# Patient Record
Sex: Female | Born: 1950
Health system: Southern US, Community
[De-identification: ages and names within clinical notes are randomized; demographics above are authoritative.]

## PROBLEM LIST (undated history)

## (undated) DIAGNOSIS — C50919 Malignant neoplasm of unspecified site of unspecified female breast: Secondary | ICD-10-CM

## (undated) DIAGNOSIS — Z9889 Other specified postprocedural states: Secondary | ICD-10-CM

## (undated) DIAGNOSIS — E559 Vitamin D deficiency, unspecified: Secondary | ICD-10-CM

## (undated) DIAGNOSIS — E039 Hypothyroidism, unspecified: Secondary | ICD-10-CM

## (undated) DIAGNOSIS — F419 Anxiety disorder, unspecified: Secondary | ICD-10-CM

## (undated) DIAGNOSIS — F32A Depression, unspecified: Secondary | ICD-10-CM

## (undated) DIAGNOSIS — I739 Peripheral vascular disease, unspecified: Secondary | ICD-10-CM

## (undated) DIAGNOSIS — J069 Acute upper respiratory infection, unspecified: Secondary | ICD-10-CM

## (undated) DIAGNOSIS — I4949 Other premature depolarization: Secondary | ICD-10-CM

## (undated) DIAGNOSIS — D649 Anemia, unspecified: Secondary | ICD-10-CM

## (undated) DIAGNOSIS — J310 Chronic rhinitis: Secondary | ICD-10-CM

## (undated) DIAGNOSIS — J45909 Unspecified asthma, uncomplicated: Secondary | ICD-10-CM

## (undated) DIAGNOSIS — I10 Essential (primary) hypertension: Secondary | ICD-10-CM

## (undated) DIAGNOSIS — M6282 Rhabdomyolysis: Secondary | ICD-10-CM

## (undated) DIAGNOSIS — I4892 Unspecified atrial flutter: Secondary | ICD-10-CM

## (undated) DIAGNOSIS — J189 Pneumonia, unspecified organism: Secondary | ICD-10-CM

## (undated) DIAGNOSIS — S2239XA Fracture of one rib, unspecified side, initial encounter for closed fracture: Secondary | ICD-10-CM

## (undated) DIAGNOSIS — N6091 Unspecified benign mammary dysplasia of right breast: Secondary | ICD-10-CM

## (undated) DIAGNOSIS — R251 Tremor, unspecified: Secondary | ICD-10-CM

## (undated) DIAGNOSIS — Z86718 Personal history of other venous thrombosis and embolism: Secondary | ICD-10-CM

## (undated) DIAGNOSIS — J449 Chronic obstructive pulmonary disease, unspecified: Secondary | ICD-10-CM

## (undated) DIAGNOSIS — E785 Hyperlipidemia, unspecified: Secondary | ICD-10-CM

## (undated) DIAGNOSIS — R112 Nausea with vomiting, unspecified: Secondary | ICD-10-CM

## (undated) DIAGNOSIS — F329 Major depressive disorder, single episode, unspecified: Secondary | ICD-10-CM

## (undated) DIAGNOSIS — Z9289 Personal history of other medical treatment: Secondary | ICD-10-CM

## (undated) DIAGNOSIS — R7302 Impaired glucose tolerance (oral): Secondary | ICD-10-CM

## (undated) DIAGNOSIS — I4891 Unspecified atrial fibrillation: Secondary | ICD-10-CM

## (undated) DIAGNOSIS — M1990 Unspecified osteoarthritis, unspecified site: Secondary | ICD-10-CM

## (undated) HISTORY — DX: Depression, unspecified: F32.A

## (undated) HISTORY — DX: Major depressive disorder, single episode, unspecified: F32.9

## (undated) HISTORY — PX: TONSILLECTOMY: SUR1361

## (undated) HISTORY — DX: Other premature depolarization: I49.49

## (undated) HISTORY — DX: Chronic obstructive pulmonary disease, unspecified: J44.9

## (undated) HISTORY — PX: OTHER SURGICAL HISTORY: SHX169

## (undated) HISTORY — DX: Personal history of other venous thrombosis and embolism: Z86.718

## (undated) HISTORY — DX: Tremor, unspecified: R25.1

## (undated) HISTORY — DX: Impaired glucose tolerance (oral): R73.02

## (undated) HISTORY — DX: Unspecified benign mammary dysplasia of right breast: N60.91

## (undated) HISTORY — PX: BREAST SURGERY: SHX581

## (undated) HISTORY — PX: ABDOMINAL HYSTERECTOMY: SHX81

## (undated) HISTORY — DX: Morbid (severe) obesity due to excess calories: E66.01

## (undated) HISTORY — DX: Anxiety disorder, unspecified: F41.9

## (undated) HISTORY — DX: Vitamin D deficiency, unspecified: E55.9

## (undated) HISTORY — DX: Essential (primary) hypertension: I10

## (undated) HISTORY — DX: Personal history of other medical treatment: Z92.89

## (undated) HISTORY — DX: Fracture of one rib, unspecified side, initial encounter for closed fracture: S22.39XA

## (undated) HISTORY — PX: COLONOSCOPY: SHX174

## (undated) HISTORY — DX: Chronic rhinitis: J31.0

## (undated) HISTORY — DX: Hyperlipidemia, unspecified: E78.5

## (undated) HISTORY — DX: Other specified postprocedural states: Z98.890

## (undated) HISTORY — DX: Acute upper respiratory infection, unspecified: J06.9

## (undated) HISTORY — DX: Unspecified atrial flutter: I48.92

## (undated) HISTORY — DX: Unspecified atrial fibrillation: I48.91

## (undated) HISTORY — DX: Rhabdomyolysis: M62.82

## (undated) NOTE — *Deleted (*Deleted)
Time of death 1200 per 2 RN per protcol.

---

## 1973-01-11 HISTORY — PX: OTHER SURGICAL HISTORY: SHX169

## 1997-09-30 ENCOUNTER — Ambulatory Visit (HOSPITAL_COMMUNITY): Admission: RE | Admit: 1997-09-30 | Discharge: 1997-09-30 | Payer: Self-pay | Admitting: *Deleted

## 1997-11-04 ENCOUNTER — Ambulatory Visit (HOSPITAL_COMMUNITY): Admission: RE | Admit: 1997-11-04 | Discharge: 1997-11-04 | Payer: Self-pay | Admitting: Gastroenterology

## 1998-01-11 ENCOUNTER — Encounter: Payer: Self-pay | Admitting: Internal Medicine

## 1998-01-11 LAB — CONVERTED CEMR LAB

## 1998-02-03 ENCOUNTER — Emergency Department (HOSPITAL_COMMUNITY): Admission: EM | Admit: 1998-02-03 | Discharge: 1998-02-03 | Payer: Self-pay | Admitting: Emergency Medicine

## 1998-02-03 ENCOUNTER — Encounter: Payer: Self-pay | Admitting: Emergency Medicine

## 1998-04-02 ENCOUNTER — Other Ambulatory Visit: Admission: RE | Admit: 1998-04-02 | Discharge: 1998-04-02 | Payer: Self-pay | Admitting: Obstetrics and Gynecology

## 1998-08-25 ENCOUNTER — Emergency Department (HOSPITAL_COMMUNITY): Admission: EM | Admit: 1998-08-25 | Discharge: 1998-08-25 | Payer: Self-pay | Admitting: Emergency Medicine

## 1998-08-25 ENCOUNTER — Encounter: Payer: Self-pay | Admitting: Emergency Medicine

## 1998-09-17 ENCOUNTER — Encounter: Admission: RE | Admit: 1998-09-17 | Discharge: 1998-12-16 | Payer: Self-pay | Admitting: Neurology

## 1998-09-18 ENCOUNTER — Other Ambulatory Visit: Admission: RE | Admit: 1998-09-18 | Discharge: 1998-09-18 | Payer: Self-pay | Admitting: Obstetrics and Gynecology

## 1998-10-22 ENCOUNTER — Encounter: Admission: RE | Admit: 1998-10-22 | Discharge: 1998-10-22 | Payer: Self-pay | Admitting: Neurology

## 1999-03-02 ENCOUNTER — Encounter: Payer: Self-pay | Admitting: Emergency Medicine

## 1999-03-02 ENCOUNTER — Inpatient Hospital Stay (HOSPITAL_COMMUNITY): Admission: EM | Admit: 1999-03-02 | Discharge: 1999-03-03 | Payer: Self-pay | Admitting: Emergency Medicine

## 1999-05-26 ENCOUNTER — Ambulatory Visit (HOSPITAL_COMMUNITY): Admission: RE | Admit: 1999-05-26 | Discharge: 1999-05-26 | Payer: Self-pay | Admitting: *Deleted

## 1999-06-24 ENCOUNTER — Inpatient Hospital Stay (HOSPITAL_COMMUNITY): Admission: EM | Admit: 1999-06-24 | Discharge: 1999-06-28 | Payer: Self-pay | Admitting: Emergency Medicine

## 1999-06-24 ENCOUNTER — Encounter: Payer: Self-pay | Admitting: Emergency Medicine

## 1999-07-02 ENCOUNTER — Other Ambulatory Visit: Admission: RE | Admit: 1999-07-02 | Discharge: 1999-07-02 | Payer: Self-pay | Admitting: Obstetrics & Gynecology

## 1999-07-03 ENCOUNTER — Encounter: Payer: Self-pay | Admitting: Obstetrics & Gynecology

## 1999-07-03 ENCOUNTER — Encounter: Admission: RE | Admit: 1999-07-03 | Discharge: 1999-07-03 | Payer: Self-pay | Admitting: Obstetrics & Gynecology

## 1999-09-01 ENCOUNTER — Encounter: Admission: RE | Admit: 1999-09-01 | Discharge: 1999-09-01 | Payer: Self-pay

## 1999-09-05 ENCOUNTER — Emergency Department (HOSPITAL_COMMUNITY): Admission: EM | Admit: 1999-09-05 | Discharge: 1999-09-05 | Payer: Self-pay | Admitting: Emergency Medicine

## 1999-09-05 ENCOUNTER — Encounter: Payer: Self-pay | Admitting: Emergency Medicine

## 2000-09-28 ENCOUNTER — Encounter: Admission: RE | Admit: 2000-09-28 | Discharge: 2000-09-28 | Payer: Self-pay | Admitting: Internal Medicine

## 2000-09-28 ENCOUNTER — Encounter: Payer: Self-pay | Admitting: Internal Medicine

## 2000-10-11 ENCOUNTER — Encounter: Payer: Self-pay | Admitting: Emergency Medicine

## 2000-10-12 ENCOUNTER — Inpatient Hospital Stay (HOSPITAL_COMMUNITY): Admission: EM | Admit: 2000-10-12 | Discharge: 2000-10-17 | Payer: Self-pay | Admitting: Psychiatry

## 2000-10-18 ENCOUNTER — Other Ambulatory Visit (HOSPITAL_COMMUNITY): Admission: RE | Admit: 2000-10-18 | Discharge: 2000-10-27 | Payer: Self-pay | Admitting: *Deleted

## 2000-11-16 ENCOUNTER — Encounter: Admission: RE | Admit: 2000-11-16 | Discharge: 2000-11-16 | Payer: Self-pay | Admitting: *Deleted

## 2000-11-22 ENCOUNTER — Encounter: Payer: Self-pay | Admitting: Internal Medicine

## 2000-11-22 ENCOUNTER — Inpatient Hospital Stay (HOSPITAL_COMMUNITY): Admission: EM | Admit: 2000-11-22 | Discharge: 2000-11-26 | Payer: Self-pay | Admitting: Emergency Medicine

## 2000-11-24 ENCOUNTER — Encounter: Payer: Self-pay | Admitting: Internal Medicine

## 2000-12-14 ENCOUNTER — Encounter: Admission: RE | Admit: 2000-12-14 | Discharge: 2000-12-14 | Payer: Self-pay | Admitting: *Deleted

## 2001-01-25 ENCOUNTER — Encounter: Payer: Self-pay | Admitting: Cardiology

## 2001-01-25 ENCOUNTER — Ambulatory Visit (HOSPITAL_COMMUNITY): Admission: RE | Admit: 2001-01-25 | Discharge: 2001-01-25 | Payer: Self-pay | Admitting: Cardiology

## 2001-08-25 ENCOUNTER — Inpatient Hospital Stay (HOSPITAL_COMMUNITY): Admission: EM | Admit: 2001-08-25 | Discharge: 2001-08-26 | Payer: Self-pay | Admitting: *Deleted

## 2001-12-08 ENCOUNTER — Encounter: Payer: Self-pay | Admitting: Obstetrics and Gynecology

## 2001-12-08 ENCOUNTER — Encounter: Admission: RE | Admit: 2001-12-08 | Discharge: 2001-12-08 | Payer: Self-pay | Admitting: Obstetrics and Gynecology

## 2002-09-07 ENCOUNTER — Emergency Department (HOSPITAL_COMMUNITY): Admission: EM | Admit: 2002-09-07 | Discharge: 2002-09-07 | Payer: Self-pay | Admitting: Emergency Medicine

## 2002-09-07 ENCOUNTER — Encounter: Payer: Self-pay | Admitting: Emergency Medicine

## 2003-01-17 ENCOUNTER — Encounter: Admission: RE | Admit: 2003-01-17 | Discharge: 2003-01-17 | Payer: Self-pay | Admitting: Internal Medicine

## 2003-02-22 ENCOUNTER — Emergency Department (HOSPITAL_COMMUNITY): Admission: EM | Admit: 2003-02-22 | Discharge: 2003-02-22 | Payer: Self-pay | Admitting: Emergency Medicine

## 2003-03-22 ENCOUNTER — Encounter: Admission: RE | Admit: 2003-03-22 | Discharge: 2003-03-22 | Payer: Self-pay | Admitting: Internal Medicine

## 2003-06-17 ENCOUNTER — Encounter: Admission: RE | Admit: 2003-06-17 | Discharge: 2003-06-17 | Payer: Self-pay | Admitting: Internal Medicine

## 2003-06-21 ENCOUNTER — Inpatient Hospital Stay (HOSPITAL_COMMUNITY): Admission: EM | Admit: 2003-06-21 | Discharge: 2003-06-22 | Payer: Self-pay | Admitting: Emergency Medicine

## 2003-08-12 ENCOUNTER — Emergency Department (HOSPITAL_COMMUNITY): Admission: EM | Admit: 2003-08-12 | Discharge: 2003-08-13 | Payer: Self-pay | Admitting: Emergency Medicine

## 2003-08-13 ENCOUNTER — Ambulatory Visit (HOSPITAL_COMMUNITY): Admission: RE | Admit: 2003-08-13 | Discharge: 2003-08-13 | Payer: Self-pay | Admitting: Emergency Medicine

## 2003-10-22 ENCOUNTER — Encounter: Payer: Self-pay | Admitting: Internal Medicine

## 2004-02-03 ENCOUNTER — Encounter: Admission: RE | Admit: 2004-02-03 | Discharge: 2004-02-03 | Payer: Self-pay | Admitting: Obstetrics and Gynecology

## 2004-02-13 LAB — HM COLONOSCOPY

## 2004-02-21 ENCOUNTER — Ambulatory Visit: Payer: Self-pay | Admitting: Internal Medicine

## 2004-02-26 ENCOUNTER — Ambulatory Visit: Payer: Self-pay | Admitting: Internal Medicine

## 2004-02-27 ENCOUNTER — Ambulatory Visit: Payer: Self-pay | Admitting: Internal Medicine

## 2004-03-10 ENCOUNTER — Ambulatory Visit: Payer: Self-pay | Admitting: Internal Medicine

## 2004-03-18 ENCOUNTER — Ambulatory Visit: Payer: Self-pay | Admitting: Internal Medicine

## 2004-03-30 ENCOUNTER — Ambulatory Visit: Payer: Self-pay | Admitting: Internal Medicine

## 2004-03-31 ENCOUNTER — Ambulatory Visit: Payer: Self-pay | Admitting: Internal Medicine

## 2004-04-17 ENCOUNTER — Emergency Department (HOSPITAL_COMMUNITY): Admission: EM | Admit: 2004-04-17 | Discharge: 2004-04-17 | Payer: Self-pay | Admitting: Emergency Medicine

## 2004-04-30 ENCOUNTER — Ambulatory Visit: Payer: Self-pay | Admitting: Internal Medicine

## 2004-06-19 ENCOUNTER — Ambulatory Visit: Payer: Self-pay | Admitting: Internal Medicine

## 2004-07-10 ENCOUNTER — Ambulatory Visit: Payer: Self-pay | Admitting: Internal Medicine

## 2004-09-25 ENCOUNTER — Ambulatory Visit: Payer: Self-pay | Admitting: Internal Medicine

## 2004-09-29 ENCOUNTER — Ambulatory Visit: Payer: Self-pay | Admitting: Internal Medicine

## 2004-11-04 ENCOUNTER — Ambulatory Visit: Payer: Self-pay | Admitting: Internal Medicine

## 2004-11-27 ENCOUNTER — Ambulatory Visit: Payer: Self-pay | Admitting: Cardiology

## 2004-11-27 ENCOUNTER — Inpatient Hospital Stay (HOSPITAL_COMMUNITY): Admission: EM | Admit: 2004-11-27 | Discharge: 2004-12-05 | Payer: Self-pay | Admitting: Emergency Medicine

## 2004-12-07 ENCOUNTER — Ambulatory Visit: Payer: Self-pay | Admitting: Cardiology

## 2004-12-08 ENCOUNTER — Ambulatory Visit: Payer: Self-pay | Admitting: Internal Medicine

## 2004-12-17 ENCOUNTER — Ambulatory Visit: Payer: Self-pay | Admitting: Internal Medicine

## 2004-12-17 ENCOUNTER — Ambulatory Visit: Payer: Self-pay | Admitting: Cardiology

## 2004-12-24 ENCOUNTER — Ambulatory Visit: Payer: Self-pay | Admitting: *Deleted

## 2004-12-24 ENCOUNTER — Ambulatory Visit: Payer: Self-pay | Admitting: Internal Medicine

## 2004-12-31 ENCOUNTER — Ambulatory Visit: Payer: Self-pay | Admitting: Cardiology

## 2005-01-08 ENCOUNTER — Ambulatory Visit: Payer: Self-pay | Admitting: Endocrinology

## 2005-01-18 ENCOUNTER — Ambulatory Visit: Payer: Self-pay | Admitting: Cardiology

## 2005-01-21 ENCOUNTER — Ambulatory Visit: Payer: Self-pay | Admitting: Cardiology

## 2005-01-21 ENCOUNTER — Ambulatory Visit: Payer: Self-pay | Admitting: Hematology & Oncology

## 2005-02-18 ENCOUNTER — Ambulatory Visit: Payer: Self-pay | Admitting: *Deleted

## 2005-02-25 ENCOUNTER — Ambulatory Visit: Payer: Self-pay | Admitting: Internal Medicine

## 2005-03-04 ENCOUNTER — Ambulatory Visit: Payer: Self-pay | Admitting: Cardiology

## 2005-03-11 ENCOUNTER — Emergency Department (HOSPITAL_COMMUNITY): Admission: EM | Admit: 2005-03-11 | Discharge: 2005-03-11 | Payer: Self-pay | Admitting: Emergency Medicine

## 2005-03-11 ENCOUNTER — Ambulatory Visit: Payer: Self-pay | Admitting: Internal Medicine

## 2005-03-18 ENCOUNTER — Ambulatory Visit: Payer: Self-pay | Admitting: Cardiology

## 2005-04-08 ENCOUNTER — Ambulatory Visit: Payer: Self-pay | Admitting: Cardiology

## 2005-04-13 ENCOUNTER — Ambulatory Visit: Payer: Self-pay | Admitting: Internal Medicine

## 2005-04-21 ENCOUNTER — Ambulatory Visit: Payer: Self-pay | Admitting: Internal Medicine

## 2005-04-22 ENCOUNTER — Ambulatory Visit: Payer: Self-pay | Admitting: Cardiology

## 2005-05-13 ENCOUNTER — Ambulatory Visit: Payer: Self-pay | Admitting: Cardiology

## 2005-05-25 ENCOUNTER — Encounter: Admission: RE | Admit: 2005-05-25 | Discharge: 2005-05-25 | Payer: Self-pay | Admitting: Occupational Medicine

## 2005-05-27 ENCOUNTER — Ambulatory Visit: Payer: Self-pay | Admitting: Cardiology

## 2005-05-31 ENCOUNTER — Ambulatory Visit: Payer: Self-pay | Admitting: Internal Medicine

## 2005-06-17 ENCOUNTER — Ambulatory Visit: Payer: Self-pay | Admitting: Internal Medicine

## 2005-07-05 ENCOUNTER — Ambulatory Visit: Payer: Self-pay | Admitting: Cardiology

## 2005-07-19 ENCOUNTER — Ambulatory Visit: Payer: Self-pay | Admitting: Internal Medicine

## 2005-08-16 ENCOUNTER — Ambulatory Visit: Payer: Self-pay | Admitting: Internal Medicine

## 2005-08-31 ENCOUNTER — Ambulatory Visit: Payer: Self-pay | Admitting: Internal Medicine

## 2005-09-14 ENCOUNTER — Inpatient Hospital Stay (HOSPITAL_COMMUNITY): Admission: AD | Admit: 2005-09-14 | Discharge: 2005-09-24 | Payer: Self-pay | Admitting: Cardiology

## 2005-09-14 ENCOUNTER — Ambulatory Visit: Payer: Self-pay | Admitting: Cardiovascular Disease

## 2005-09-14 ENCOUNTER — Ambulatory Visit: Payer: Self-pay | Admitting: Critical Care Medicine

## 2005-09-15 ENCOUNTER — Encounter: Payer: Self-pay | Admitting: Cardiology

## 2005-09-15 ENCOUNTER — Ambulatory Visit: Payer: Self-pay | Admitting: Cardiology

## 2005-09-27 ENCOUNTER — Ambulatory Visit: Payer: Self-pay | Admitting: Cardiology

## 2005-09-28 ENCOUNTER — Ambulatory Visit: Payer: Self-pay | Admitting: Internal Medicine

## 2005-10-06 ENCOUNTER — Ambulatory Visit: Payer: Self-pay | Admitting: Cardiology

## 2005-10-12 ENCOUNTER — Ambulatory Visit: Payer: Self-pay | Admitting: Cardiology

## 2005-10-13 ENCOUNTER — Inpatient Hospital Stay (HOSPITAL_COMMUNITY): Admission: EM | Admit: 2005-10-13 | Discharge: 2005-10-16 | Payer: Self-pay | Admitting: Emergency Medicine

## 2005-10-13 ENCOUNTER — Ambulatory Visit: Payer: Self-pay | Admitting: Internal Medicine

## 2005-10-18 ENCOUNTER — Ambulatory Visit: Payer: Self-pay | Admitting: Pulmonary Disease

## 2005-10-19 ENCOUNTER — Ambulatory Visit: Payer: Self-pay | Admitting: Internal Medicine

## 2005-10-22 ENCOUNTER — Ambulatory Visit: Payer: Self-pay | Admitting: Cardiology

## 2005-10-28 ENCOUNTER — Ambulatory Visit: Payer: Self-pay | Admitting: Internal Medicine

## 2005-11-01 ENCOUNTER — Ambulatory Visit: Payer: Self-pay | Admitting: Cardiovascular Disease

## 2005-11-03 ENCOUNTER — Ambulatory Visit: Payer: Self-pay | Admitting: Internal Medicine

## 2005-11-04 ENCOUNTER — Ambulatory Visit: Payer: Self-pay

## 2005-11-05 ENCOUNTER — Ambulatory Visit: Payer: Self-pay | Admitting: Internal Medicine

## 2005-11-12 ENCOUNTER — Ambulatory Visit: Payer: Self-pay | Admitting: Internal Medicine

## 2005-11-15 ENCOUNTER — Emergency Department (HOSPITAL_COMMUNITY): Admission: EM | Admit: 2005-11-15 | Discharge: 2005-11-15 | Payer: Self-pay | Admitting: Emergency Medicine

## 2005-11-22 ENCOUNTER — Ambulatory Visit: Payer: Self-pay | Admitting: Cardiovascular Disease

## 2005-12-01 ENCOUNTER — Ambulatory Visit: Payer: Self-pay | Admitting: Internal Medicine

## 2005-12-20 ENCOUNTER — Ambulatory Visit: Payer: Self-pay | Admitting: Cardiology

## 2005-12-22 ENCOUNTER — Ambulatory Visit: Payer: Self-pay | Admitting: Internal Medicine

## 2006-01-12 ENCOUNTER — Ambulatory Visit: Payer: Self-pay | Admitting: Cardiology

## 2006-01-21 ENCOUNTER — Ambulatory Visit: Payer: Self-pay | Admitting: Internal Medicine

## 2006-01-25 ENCOUNTER — Ambulatory Visit: Payer: Self-pay | Admitting: Cardiology

## 2006-02-04 ENCOUNTER — Ambulatory Visit: Payer: Self-pay | Admitting: Pulmonary Disease

## 2006-02-11 ENCOUNTER — Ambulatory Visit: Payer: Self-pay | Admitting: Internal Medicine

## 2006-02-11 ENCOUNTER — Encounter: Admission: RE | Admit: 2006-02-11 | Discharge: 2006-05-12 | Payer: Self-pay | Admitting: Internal Medicine

## 2006-03-01 ENCOUNTER — Ambulatory Visit: Payer: Self-pay | Admitting: Internal Medicine

## 2006-03-02 ENCOUNTER — Ambulatory Visit: Payer: Self-pay | Admitting: Cardiovascular Disease

## 2006-03-11 ENCOUNTER — Ambulatory Visit: Payer: Self-pay | Admitting: Internal Medicine

## 2006-03-16 ENCOUNTER — Ambulatory Visit: Payer: Self-pay | Admitting: *Deleted

## 2006-03-17 ENCOUNTER — Ambulatory Visit: Payer: Self-pay | Admitting: Internal Medicine

## 2006-03-17 LAB — CONVERTED CEMR LAB
ALT: 21 units/L (ref 0–40)
AST: 19 units/L (ref 0–37)
Albumin: 3.6 g/dL (ref 3.5–5.2)
Alkaline Phosphatase: 69 units/L (ref 39–117)
BUN: 12 mg/dL (ref 6–23)
Basophils Absolute: 0.1 10*3/uL (ref 0.0–0.1)
Basophils Relative: 0.9 % (ref 0.0–1.0)
Bilirubin Urine: NEGATIVE
Bilirubin, Direct: 0.2 mg/dL (ref 0.0–0.3)
CO2: 33 meq/L — ABNORMAL HIGH (ref 19–32)
Calcium: 8.8 mg/dL (ref 8.4–10.5)
Chloride: 100 meq/L (ref 96–112)
Cholesterol: 194 mg/dL (ref 0–200)
Creatinine, Ser: 0.9 mg/dL (ref 0.4–1.2)
Crystals: NEGATIVE
Direct LDL: 130.7 mg/dL
Eosinophils Absolute: 0.2 10*3/uL (ref 0.0–0.6)
Eosinophils Relative: 2.5 % (ref 0.0–5.0)
GFR calc Af Amer: 84 mL/min
GFR calc non Af Amer: 69 mL/min
Glucose, Bld: 95 mg/dL (ref 70–99)
HCT: 33.8 % — ABNORMAL LOW (ref 36.0–46.0)
HDL: 33.9 mg/dL — ABNORMAL LOW (ref >39.0)
Hemoglobin, Urine: NEGATIVE
Hemoglobin: 11.8 g/dL — ABNORMAL LOW (ref 12.0–15.0)
Ketones, ur: NEGATIVE mg/dL
Lymphocytes Relative: 34.2 % (ref 12.0–46.0)
MCHC: 34.9 g/dL (ref 30.0–36.0)
MCV: 86 fL (ref 78.0–100.0)
Monocytes Absolute: 0.6 10*3/uL (ref 0.2–0.7)
Monocytes Relative: 9 % (ref 3.0–11.0)
Mucus, UA: NEGATIVE
Neutro Abs: 3.4 10*3/uL (ref 1.4–7.7)
Neutrophils Relative %: 53.4 % (ref 43.0–77.0)
Nitrite: NEGATIVE
Platelets: 236 10*3/uL (ref 150–400)
Potassium: 3.5 meq/L (ref 3.5–5.1)
RBC: 3.93 M/uL (ref 3.87–5.11)
RDW: 16.1 % — ABNORMAL HIGH (ref 11.5–14.6)
Sodium: 140 meq/L (ref 135–145)
Specific Gravity, Urine: 1.015 (ref 1.000–1.03)
TSH: 5.49 microintl units/mL (ref 0.35–5.50)
Total Bilirubin: 0.9 mg/dL (ref 0.3–1.2)
Total CHOL/HDL Ratio: 5.7
Total Protein, Urine: NEGATIVE mg/dL
Total Protein: 6.4 g/dL (ref 6.0–8.3)
Triglycerides: 222 mg/dL (ref 0–149)
Urine Glucose: NEGATIVE mg/dL
Urobilinogen, UA: 0.2 (ref 0.0–1.0)
VLDL: 44 mg/dL — ABNORMAL HIGH (ref 0–40)
WBC: 6.6 10*3/uL (ref 4.5–10.5)
pH: 7.5 (ref 5.0–8.0)

## 2006-03-22 ENCOUNTER — Ambulatory Visit: Payer: Self-pay | Admitting: Internal Medicine

## 2006-03-23 ENCOUNTER — Ambulatory Visit: Payer: Self-pay | Admitting: Pulmonary Disease

## 2006-03-28 ENCOUNTER — Ambulatory Visit: Payer: Self-pay | Admitting: Cardiology

## 2006-03-30 ENCOUNTER — Ambulatory Visit: Payer: Self-pay | Admitting: Cardiovascular Disease

## 2006-04-27 ENCOUNTER — Ambulatory Visit: Payer: Self-pay | Admitting: *Deleted

## 2006-04-29 ENCOUNTER — Ambulatory Visit: Payer: Self-pay | Admitting: Internal Medicine

## 2006-05-06 ENCOUNTER — Ambulatory Visit: Payer: Self-pay | Admitting: Pulmonary Disease

## 2006-05-17 ENCOUNTER — Ambulatory Visit: Payer: Self-pay | Admitting: Internal Medicine

## 2006-05-25 ENCOUNTER — Ambulatory Visit: Payer: Self-pay | Admitting: *Deleted

## 2006-06-13 ENCOUNTER — Ambulatory Visit: Payer: Self-pay | Admitting: Internal Medicine

## 2006-06-17 ENCOUNTER — Ambulatory Visit: Payer: Self-pay | Admitting: Internal Medicine

## 2006-06-22 ENCOUNTER — Ambulatory Visit: Payer: Self-pay | Admitting: Cardiology

## 2006-07-20 ENCOUNTER — Ambulatory Visit: Payer: Self-pay | Admitting: Cardiology

## 2006-07-25 ENCOUNTER — Ambulatory Visit: Payer: Self-pay | Admitting: Internal Medicine

## 2006-07-29 ENCOUNTER — Encounter: Payer: Self-pay | Admitting: Internal Medicine

## 2006-07-29 DIAGNOSIS — Z8672 Personal history of thrombophlebitis: Secondary | ICD-10-CM | POA: Insufficient documentation

## 2006-07-29 DIAGNOSIS — M810 Age-related osteoporosis without current pathological fracture: Secondary | ICD-10-CM | POA: Insufficient documentation

## 2006-07-29 DIAGNOSIS — I10 Essential (primary) hypertension: Secondary | ICD-10-CM | POA: Insufficient documentation

## 2006-08-05 ENCOUNTER — Ambulatory Visit: Payer: Self-pay | Admitting: Internal Medicine

## 2006-08-06 DIAGNOSIS — J449 Chronic obstructive pulmonary disease, unspecified: Secondary | ICD-10-CM | POA: Insufficient documentation

## 2006-08-06 DIAGNOSIS — F32A Depression, unspecified: Secondary | ICD-10-CM | POA: Insufficient documentation

## 2006-08-06 DIAGNOSIS — F329 Major depressive disorder, single episode, unspecified: Secondary | ICD-10-CM | POA: Insufficient documentation

## 2006-08-17 ENCOUNTER — Ambulatory Visit: Payer: Self-pay | Admitting: Cardiology

## 2006-08-29 ENCOUNTER — Ambulatory Visit: Payer: Self-pay | Admitting: Internal Medicine

## 2006-08-29 LAB — CONVERTED CEMR LAB
ALT: 17 units/L (ref 0–35)
AST: 23 units/L (ref 0–37)
Albumin: 3.6 g/dL (ref 3.5–5.2)
Alkaline Phosphatase: 65 units/L (ref 39–117)
BUN: 12 mg/dL (ref 6–23)
Basophils Absolute: 0.1 10*3/uL (ref 0.0–0.1)
Basophils Relative: 1.2 % — ABNORMAL HIGH (ref 0.0–1.0)
Bilirubin, Direct: 0.2 mg/dL (ref 0.0–0.3)
CO2: 35 meq/L — ABNORMAL HIGH (ref 19–32)
Calcium: 8.5 mg/dL (ref 8.4–10.5)
Chloride: 106 meq/L (ref 96–112)
Creatinine, Ser: 1 mg/dL (ref 0.4–1.2)
Eosinophils Absolute: 0.2 10*3/uL (ref 0.0–0.6)
Eosinophils Relative: 2.8 % (ref 0.0–5.0)
GFR calc Af Amer: 74 mL/min
GFR calc non Af Amer: 61 mL/min
Glucose, Bld: 101 mg/dL — ABNORMAL HIGH (ref 70–99)
HCT: 34.1 % — ABNORMAL LOW (ref 36.0–46.0)
Hemoglobin: 12 g/dL (ref 12.0–15.0)
Lymphocytes Relative: 28.3 % (ref 12.0–46.0)
MCHC: 35.3 g/dL (ref 30.0–36.0)
MCV: 86.7 fL (ref 78.0–100.0)
Monocytes Absolute: 0.6 10*3/uL (ref 0.2–0.7)
Monocytes Relative: 8.7 % (ref 3.0–11.0)
Neutro Abs: 4.3 10*3/uL (ref 1.4–7.7)
Neutrophils Relative %: 59 % (ref 43.0–77.0)
Platelets: 244 10*3/uL (ref 150–400)
Potassium: 4.1 meq/L (ref 3.5–5.1)
RBC: 3.93 M/uL (ref 3.87–5.11)
RDW: 16.6 % — ABNORMAL HIGH (ref 11.5–14.6)
Sodium: 143 meq/L (ref 135–145)
TSH: 3.23 microintl units/mL (ref 0.35–5.50)
Total Bilirubin: 1.2 mg/dL (ref 0.3–1.2)
Total Protein: 6.2 g/dL (ref 6.0–8.3)
WBC: 7.3 10*3/uL (ref 4.5–10.5)

## 2006-08-31 ENCOUNTER — Ambulatory Visit: Payer: Self-pay | Admitting: Cardiology

## 2006-09-16 ENCOUNTER — Ambulatory Visit: Payer: Self-pay | Admitting: Internal Medicine

## 2006-09-21 ENCOUNTER — Ambulatory Visit: Payer: Self-pay | Admitting: Cardiology

## 2006-09-30 ENCOUNTER — Ambulatory Visit: Payer: Self-pay | Admitting: Pulmonary Disease

## 2006-10-18 ENCOUNTER — Ambulatory Visit: Payer: Self-pay | Admitting: Internal Medicine

## 2006-10-21 ENCOUNTER — Ambulatory Visit: Payer: Self-pay | Admitting: Cardiology

## 2006-10-25 ENCOUNTER — Ambulatory Visit: Payer: Self-pay | Admitting: Cardiology

## 2006-10-25 ENCOUNTER — Ambulatory Visit: Payer: Self-pay | Admitting: Internal Medicine

## 2006-10-27 ENCOUNTER — Ambulatory Visit: Payer: Self-pay | Admitting: Cardiology

## 2006-11-22 ENCOUNTER — Ambulatory Visit: Payer: Self-pay | Admitting: Cardiovascular Disease

## 2006-12-07 ENCOUNTER — Ambulatory Visit: Payer: Self-pay | Admitting: Critical Care Medicine

## 2006-12-07 ENCOUNTER — Inpatient Hospital Stay (HOSPITAL_COMMUNITY): Admission: EM | Admit: 2006-12-07 | Discharge: 2006-12-09 | Payer: Self-pay | Admitting: Emergency Medicine

## 2006-12-13 ENCOUNTER — Ambulatory Visit: Payer: Self-pay | Admitting: Cardiology

## 2006-12-16 ENCOUNTER — Ambulatory Visit: Payer: Self-pay | Admitting: Internal Medicine

## 2007-01-13 ENCOUNTER — Ambulatory Visit: Payer: Self-pay | Admitting: Internal Medicine

## 2007-01-13 DIAGNOSIS — J9612 Chronic respiratory failure with hypercapnia: Secondary | ICD-10-CM

## 2007-01-13 DIAGNOSIS — J9611 Chronic respiratory failure with hypoxia: Secondary | ICD-10-CM | POA: Insufficient documentation

## 2007-01-17 ENCOUNTER — Ambulatory Visit: Payer: Self-pay | Admitting: Cardiology

## 2007-01-17 LAB — CONVERTED CEMR LAB
INR: 3.9 — ABNORMAL HIGH (ref 0.8–1.0)
Prothrombin Time: 25.1 s — ABNORMAL HIGH (ref 10.9–13.3)

## 2007-01-19 ENCOUNTER — Ambulatory Visit: Payer: Self-pay | Admitting: Internal Medicine

## 2007-01-19 DIAGNOSIS — J441 Chronic obstructive pulmonary disease with (acute) exacerbation: Secondary | ICD-10-CM | POA: Insufficient documentation

## 2007-02-02 ENCOUNTER — Ambulatory Visit: Payer: Self-pay | Admitting: Cardiology

## 2007-02-10 ENCOUNTER — Ambulatory Visit: Payer: Self-pay | Admitting: Cardiology

## 2007-02-23 ENCOUNTER — Ambulatory Visit: Payer: Self-pay | Admitting: Internal Medicine

## 2007-03-01 ENCOUNTER — Ambulatory Visit: Payer: Self-pay | Admitting: Internal Medicine

## 2007-03-03 ENCOUNTER — Ambulatory Visit: Payer: Self-pay | Admitting: Cardiology

## 2007-03-13 LAB — CONVERTED CEMR LAB: Pap Smear: NORMAL

## 2007-03-17 ENCOUNTER — Ambulatory Visit: Payer: Self-pay | Admitting: Internal Medicine

## 2007-03-20 DIAGNOSIS — R609 Edema, unspecified: Secondary | ICD-10-CM | POA: Insufficient documentation

## 2007-03-22 ENCOUNTER — Telehealth (INDEPENDENT_AMBULATORY_CARE_PROVIDER_SITE_OTHER): Payer: Self-pay | Admitting: *Deleted

## 2007-03-23 ENCOUNTER — Ambulatory Visit: Payer: Self-pay | Admitting: Internal Medicine

## 2007-04-07 ENCOUNTER — Ambulatory Visit: Payer: Self-pay | Admitting: Cardiology

## 2007-04-13 ENCOUNTER — Telehealth (INDEPENDENT_AMBULATORY_CARE_PROVIDER_SITE_OTHER): Payer: Self-pay | Admitting: *Deleted

## 2007-04-19 ENCOUNTER — Telehealth: Payer: Self-pay | Admitting: Internal Medicine

## 2007-04-26 ENCOUNTER — Ambulatory Visit: Payer: Self-pay | Admitting: Internal Medicine

## 2007-04-26 DIAGNOSIS — J310 Chronic rhinitis: Secondary | ICD-10-CM | POA: Insufficient documentation

## 2007-05-05 ENCOUNTER — Ambulatory Visit: Payer: Self-pay | Admitting: Cardiology

## 2007-05-26 ENCOUNTER — Ambulatory Visit: Payer: Self-pay | Admitting: Internal Medicine

## 2007-06-08 ENCOUNTER — Ambulatory Visit: Payer: Self-pay | Admitting: Cardiology

## 2007-06-19 ENCOUNTER — Ambulatory Visit: Payer: Self-pay | Admitting: Internal Medicine

## 2007-06-19 DIAGNOSIS — E739 Lactose intolerance, unspecified: Secondary | ICD-10-CM | POA: Insufficient documentation

## 2007-06-19 DIAGNOSIS — Z86718 Personal history of other venous thrombosis and embolism: Secondary | ICD-10-CM | POA: Insufficient documentation

## 2007-06-19 DIAGNOSIS — I480 Paroxysmal atrial fibrillation: Secondary | ICD-10-CM | POA: Insufficient documentation

## 2007-06-19 DIAGNOSIS — E785 Hyperlipidemia, unspecified: Secondary | ICD-10-CM | POA: Insufficient documentation

## 2007-06-19 DIAGNOSIS — F411 Generalized anxiety disorder: Secondary | ICD-10-CM | POA: Insufficient documentation

## 2007-06-19 HISTORY — DX: Personal history of other venous thrombosis and embolism: Z86.718

## 2007-06-19 LAB — CONVERTED CEMR LAB
ALT: 19 units/L (ref 0–35)
AST: 17 units/L (ref 0–37)
Albumin: 3.8 g/dL (ref 3.5–5.2)
Alkaline Phosphatase: 79 units/L (ref 39–117)
BUN: 14 mg/dL (ref 6–23)
Basophils Absolute: 0.1 10*3/uL (ref 0.0–0.1)
Basophils Relative: 0.9 % (ref 0.0–1.0)
Bilirubin Urine: NEGATIVE
Bilirubin, Direct: 0.2 mg/dL (ref 0.0–0.3)
CO2: 35 meq/L — ABNORMAL HIGH (ref 19–32)
Calcium: 8.9 mg/dL (ref 8.4–10.5)
Chloride: 102 meq/L (ref 96–112)
Creatinine, Ser: 0.9 mg/dL (ref 0.4–1.2)
Crystals: NEGATIVE
Eosinophils Absolute: 0.2 10*3/uL (ref 0.0–0.7)
Eosinophils Relative: 1.9 % (ref 0.0–5.0)
GFR calc Af Amer: 83 mL/min
GFR calc non Af Amer: 69 mL/min
Glucose, Bld: 96 mg/dL (ref 70–99)
HCT: 36 % (ref 36.0–46.0)
Hemoglobin, Urine: NEGATIVE
Hemoglobin: 12.4 g/dL (ref 12.0–15.0)
Hgb A1c MFr Bld: 4.8 % (ref 4.6–6.0)
Ketones, ur: NEGATIVE mg/dL
Lymphocytes Relative: 30.6 % (ref 12.0–46.0)
MCHC: 34.5 g/dL (ref 30.0–36.0)
MCV: 85.2 fL (ref 78.0–100.0)
Monocytes Absolute: 0.7 10*3/uL (ref 0.1–1.0)
Monocytes Relative: 9.1 % (ref 3.0–12.0)
Mucus, UA: NEGATIVE
Neutro Abs: 4.6 10*3/uL (ref 1.4–7.7)
Neutrophils Relative %: 57.5 % (ref 43.0–77.0)
Nitrite: NEGATIVE
Platelets: 246 10*3/uL (ref 150–400)
Potassium: 4 meq/L (ref 3.5–5.1)
RBC / HPF: NONE SEEN
RBC: 4.22 M/uL (ref 3.87–5.11)
RDW: 16.4 % — ABNORMAL HIGH (ref 11.5–14.6)
Sodium: 145 meq/L (ref 135–145)
Specific Gravity, Urine: 1.015 (ref 1.000–1.03)
TSH: 3.17 microintl units/mL (ref 0.35–5.50)
Total Bilirubin: 1.2 mg/dL (ref 0.3–1.2)
Total Protein, Urine: NEGATIVE mg/dL
Total Protein: 6.5 g/dL (ref 6.0–8.3)
Urine Glucose: NEGATIVE mg/dL
Urobilinogen, UA: 0.2 (ref 0.0–1.0)
WBC: 8 10*3/uL (ref 4.5–10.5)
pH: 7 (ref 5.0–8.0)

## 2007-06-27 ENCOUNTER — Ambulatory Visit: Payer: Self-pay | Admitting: Cardiology

## 2007-07-04 ENCOUNTER — Telehealth: Payer: Self-pay | Admitting: Internal Medicine

## 2007-07-07 ENCOUNTER — Ambulatory Visit: Payer: Self-pay | Admitting: Internal Medicine

## 2007-07-10 ENCOUNTER — Telehealth: Payer: Self-pay | Admitting: Internal Medicine

## 2007-07-19 ENCOUNTER — Telehealth (INDEPENDENT_AMBULATORY_CARE_PROVIDER_SITE_OTHER): Payer: Self-pay | Admitting: *Deleted

## 2007-07-25 ENCOUNTER — Ambulatory Visit: Payer: Self-pay | Admitting: Cardiology

## 2007-07-27 ENCOUNTER — Telehealth (INDEPENDENT_AMBULATORY_CARE_PROVIDER_SITE_OTHER): Payer: Self-pay | Admitting: *Deleted

## 2007-07-28 ENCOUNTER — Telehealth (INDEPENDENT_AMBULATORY_CARE_PROVIDER_SITE_OTHER): Payer: Self-pay | Admitting: *Deleted

## 2007-07-31 ENCOUNTER — Ambulatory Visit: Payer: Self-pay | Admitting: Cardiology

## 2007-08-01 ENCOUNTER — Telehealth (INDEPENDENT_AMBULATORY_CARE_PROVIDER_SITE_OTHER): Payer: Self-pay | Admitting: *Deleted

## 2007-08-22 ENCOUNTER — Ambulatory Visit: Payer: Self-pay | Admitting: Internal Medicine

## 2007-08-29 ENCOUNTER — Ambulatory Visit: Payer: Self-pay | Admitting: Internal Medicine

## 2007-08-31 ENCOUNTER — Telehealth (INDEPENDENT_AMBULATORY_CARE_PROVIDER_SITE_OTHER): Payer: Self-pay | Admitting: *Deleted

## 2007-09-07 ENCOUNTER — Ambulatory Visit: Payer: Self-pay | Admitting: Cardiovascular Disease

## 2007-09-07 ENCOUNTER — Telehealth: Payer: Self-pay | Admitting: Adult Health

## 2007-10-02 ENCOUNTER — Ambulatory Visit: Payer: Self-pay | Admitting: Internal Medicine

## 2007-10-05 ENCOUNTER — Ambulatory Visit: Payer: Self-pay | Admitting: Cardiology

## 2007-10-05 ENCOUNTER — Telehealth (INDEPENDENT_AMBULATORY_CARE_PROVIDER_SITE_OTHER): Payer: Self-pay | Admitting: *Deleted

## 2007-10-06 ENCOUNTER — Ambulatory Visit: Payer: Self-pay | Admitting: Internal Medicine

## 2007-10-06 ENCOUNTER — Encounter: Payer: Self-pay | Admitting: Internal Medicine

## 2007-10-09 ENCOUNTER — Ambulatory Visit: Payer: Self-pay | Admitting: Cardiovascular Disease

## 2007-10-24 ENCOUNTER — Encounter: Payer: Self-pay | Admitting: Adult Health

## 2007-10-24 ENCOUNTER — Ambulatory Visit: Payer: Self-pay | Admitting: Internal Medicine

## 2007-10-24 LAB — CONVERTED CEMR LAB: Vit D, 1,25-Dihydroxy: 28 — ABNORMAL LOW (ref 30–89)

## 2007-10-27 ENCOUNTER — Ambulatory Visit: Payer: Self-pay | Admitting: Internal Medicine

## 2007-10-27 DIAGNOSIS — E559 Vitamin D deficiency, unspecified: Secondary | ICD-10-CM | POA: Insufficient documentation

## 2007-10-27 LAB — CONVERTED CEMR LAB
ALT: 18 units/L (ref 0–35)
AST: 19 units/L (ref 0–37)
Albumin: 3.6 g/dL (ref 3.5–5.2)
Alkaline Phosphatase: 92 units/L (ref 39–117)
BUN: 10 mg/dL (ref 6–23)
Bilirubin, Direct: 0.2 mg/dL (ref 0.0–0.3)
CO2: 33 meq/L — ABNORMAL HIGH (ref 19–32)
Calcium: 8.6 mg/dL (ref 8.4–10.5)
Chloride: 105 meq/L (ref 96–112)
Creatinine, Ser: 0.8 mg/dL (ref 0.4–1.2)
GFR calc Af Amer: 95 mL/min
GFR calc non Af Amer: 79 mL/min
Glucose, Bld: 82 mg/dL (ref 70–99)
Potassium: 3.8 meq/L (ref 3.5–5.1)
Sodium: 144 meq/L (ref 135–145)
Total Bilirubin: 0.9 mg/dL (ref 0.3–1.2)
Total Protein: 6.3 g/dL (ref 6.0–8.3)

## 2007-10-30 ENCOUNTER — Ambulatory Visit: Payer: Self-pay | Admitting: Internal Medicine

## 2007-11-27 ENCOUNTER — Ambulatory Visit: Payer: Self-pay | Admitting: Cardiology

## 2007-12-05 ENCOUNTER — Ambulatory Visit: Payer: Self-pay | Admitting: Internal Medicine

## 2007-12-05 DIAGNOSIS — H109 Unspecified conjunctivitis: Secondary | ICD-10-CM | POA: Insufficient documentation

## 2007-12-05 LAB — CONVERTED CEMR LAB
ALT: 17 units/L (ref 0–35)
AST: 17 units/L (ref 0–37)
Albumin: 3.6 g/dL (ref 3.5–5.2)
Alkaline Phosphatase: 82 units/L (ref 39–117)
BUN: 13 mg/dL (ref 6–23)
Bacteria, UA: NEGATIVE
Basophils Absolute: 0 10*3/uL (ref 0.0–0.1)
Basophils Relative: 0.8 % (ref 0.0–3.0)
Bilirubin Urine: NEGATIVE
Bilirubin, Direct: 0.1 mg/dL (ref 0.0–0.3)
CO2: 34 meq/L — ABNORMAL HIGH (ref 19–32)
Calcium: 8.9 mg/dL (ref 8.4–10.5)
Chloride: 104 meq/L (ref 96–112)
Cholesterol: 178 mg/dL (ref 0–200)
Creatinine, Ser: 0.8 mg/dL (ref 0.4–1.2)
Crystals: NEGATIVE
Eosinophils Absolute: 0.2 10*3/uL (ref 0.0–0.7)
Eosinophils Relative: 4.3 % (ref 0.0–5.0)
GFR calc Af Amer: 95 mL/min
GFR calc non Af Amer: 79 mL/min
Glucose, Bld: 97 mg/dL (ref 70–99)
HCT: 35.1 % — ABNORMAL LOW (ref 36.0–46.0)
HDL: 39.8 mg/dL (ref >39.0)
Hemoglobin, Urine: NEGATIVE
Hemoglobin: 12.2 g/dL (ref 12.0–15.0)
Ketones, ur: NEGATIVE mg/dL
LDL Cholesterol: 120 mg/dL — ABNORMAL HIGH (ref 0–99)
Lymphocytes Relative: 33.1 % (ref 12.0–46.0)
MCHC: 34.8 g/dL (ref 30.0–36.0)
MCV: 85.7 fL (ref 78.0–100.0)
Monocytes Absolute: 0.4 10*3/uL (ref 0.1–1.0)
Monocytes Relative: 7.3 % (ref 3.0–12.0)
Neutro Abs: 2.9 10*3/uL (ref 1.4–7.7)
Neutrophils Relative %: 54.5 % (ref 43.0–77.0)
Nitrite: NEGATIVE
Platelets: 192 10*3/uL (ref 150–400)
Potassium: 4.3 meq/L (ref 3.5–5.1)
RBC / HPF: NONE SEEN
RBC: 4.1 M/uL (ref 3.87–5.11)
RDW: 16.9 % — ABNORMAL HIGH (ref 11.5–14.6)
Sodium: 143 meq/L (ref 135–145)
Specific Gravity, Urine: 1.015 (ref 1.000–1.03)
TSH: 3.41 microintl units/mL (ref 0.35–5.50)
Total Bilirubin: 1.2 mg/dL (ref 0.3–1.2)
Total CHOL/HDL Ratio: 4.5
Total Protein, Urine: NEGATIVE mg/dL
Total Protein: 6.3 g/dL (ref 6.0–8.3)
Triglycerides: 90 mg/dL (ref 0–149)
Urine Glucose: NEGATIVE mg/dL
Urobilinogen, UA: 0.2 (ref 0.0–1.0)
VLDL: 18 mg/dL (ref 0–40)
WBC: 5.2 10*3/uL (ref 4.5–10.5)
pH: 8 (ref 5.0–8.0)

## 2007-12-06 LAB — CONVERTED CEMR LAB: Vit D, 1,25-Dihydroxy: 33 (ref 30–89)

## 2007-12-18 ENCOUNTER — Ambulatory Visit: Payer: Self-pay | Admitting: Internal Medicine

## 2007-12-20 ENCOUNTER — Ambulatory Visit: Payer: Self-pay | Admitting: Cardiovascular Disease

## 2008-01-09 ENCOUNTER — Inpatient Hospital Stay (HOSPITAL_COMMUNITY): Admission: EM | Admit: 2008-01-09 | Discharge: 2008-01-15 | Payer: Self-pay | Admitting: Emergency Medicine

## 2008-01-09 ENCOUNTER — Ambulatory Visit: Payer: Self-pay | Admitting: Internal Medicine

## 2008-01-09 DIAGNOSIS — J189 Pneumonia, unspecified organism: Secondary | ICD-10-CM | POA: Insufficient documentation

## 2008-01-12 LAB — HM MAMMOGRAPHY: HM Mammogram: NORMAL

## 2008-01-17 ENCOUNTER — Ambulatory Visit: Payer: Self-pay | Admitting: Cardiovascular Disease

## 2008-01-22 ENCOUNTER — Ambulatory Visit: Payer: Self-pay | Admitting: Internal Medicine

## 2008-01-23 ENCOUNTER — Ambulatory Visit: Payer: Self-pay | Admitting: Internal Medicine

## 2008-01-24 ENCOUNTER — Ambulatory Visit: Payer: Self-pay | Admitting: Cardiology

## 2008-01-29 ENCOUNTER — Telehealth: Payer: Self-pay | Admitting: Internal Medicine

## 2008-02-07 ENCOUNTER — Ambulatory Visit: Payer: Self-pay | Admitting: Pulmonary Disease

## 2008-02-07 ENCOUNTER — Ambulatory Visit: Payer: Self-pay | Admitting: Internal Medicine

## 2008-02-12 ENCOUNTER — Telehealth: Payer: Self-pay | Admitting: Internal Medicine

## 2008-02-15 ENCOUNTER — Emergency Department (HOSPITAL_COMMUNITY): Admission: EM | Admit: 2008-02-15 | Discharge: 2008-02-15 | Payer: Self-pay | Admitting: Emergency Medicine

## 2008-02-19 ENCOUNTER — Telehealth (INDEPENDENT_AMBULATORY_CARE_PROVIDER_SITE_OTHER): Payer: Self-pay | Admitting: *Deleted

## 2008-02-20 ENCOUNTER — Ambulatory Visit: Payer: Self-pay | Admitting: Internal Medicine

## 2008-02-23 ENCOUNTER — Ambulatory Visit: Payer: Self-pay | Admitting: Internal Medicine

## 2008-02-23 ENCOUNTER — Inpatient Hospital Stay (HOSPITAL_COMMUNITY): Admission: RE | Admit: 2008-02-23 | Discharge: 2008-02-27 | Payer: Self-pay | Admitting: General Surgery

## 2008-03-01 ENCOUNTER — Ambulatory Visit: Payer: Self-pay | Admitting: Adult Health

## 2008-03-01 ENCOUNTER — Ambulatory Visit: Payer: Self-pay | Admitting: Internal Medicine

## 2008-03-08 ENCOUNTER — Ambulatory Visit: Payer: Self-pay | Admitting: Cardiology

## 2008-03-20 ENCOUNTER — Ambulatory Visit: Payer: Self-pay | Admitting: Internal Medicine

## 2008-03-22 ENCOUNTER — Ambulatory Visit: Payer: Self-pay | Admitting: Cardiovascular Disease

## 2008-03-28 ENCOUNTER — Ambulatory Visit: Payer: Self-pay | Admitting: Endocrinology

## 2008-03-28 DIAGNOSIS — M25579 Pain in unspecified ankle and joints of unspecified foot: Secondary | ICD-10-CM | POA: Insufficient documentation

## 2008-03-28 LAB — CONVERTED CEMR LAB
BUN: 14 mg/dL (ref 6–23)
Basophils Absolute: 0 10*3/uL (ref 0.0–0.1)
Basophils Relative: 0.4 % (ref 0.0–3.0)
CO2: 32 meq/L (ref 19–32)
Calcium: 8.8 mg/dL (ref 8.4–10.5)
Chloride: 106 meq/L (ref 96–112)
Creatinine, Ser: 0.8 mg/dL (ref 0.4–1.2)
Eosinophils Absolute: 0.1 10*3/uL (ref 0.0–0.7)
Eosinophils Relative: 1.7 % (ref 0.0–5.0)
GFR calc non Af Amer: 78.35 mL/min (ref >60)
Glucose, Bld: 88 mg/dL (ref 70–99)
HCT: 34.6 % — ABNORMAL LOW (ref 36.0–46.0)
Hemoglobin: 12 g/dL (ref 12.0–15.0)
Lymphocytes Relative: 33.3 % (ref 12.0–46.0)
Lymphs Abs: 2.2 10*3/uL (ref 0.7–4.0)
MCHC: 34.8 g/dL (ref 30.0–36.0)
MCV: 84.1 fL (ref 78.0–100.0)
Monocytes Absolute: 0.5 10*3/uL (ref 0.1–1.0)
Monocytes Relative: 7.5 % (ref 3.0–12.0)
Neutro Abs: 3.7 10*3/uL (ref 1.4–7.7)
Neutrophils Relative %: 57.1 % (ref 43.0–77.0)
Platelets: 239 10*3/uL (ref 150.0–400.0)
Potassium: 3.7 meq/L (ref 3.5–5.1)
Pro B Natriuretic peptide (BNP): 115 pg/mL — ABNORMAL HIGH (ref 0.0–100.0)
RBC: 4.11 M/uL (ref 3.87–5.11)
RDW: 17 % — ABNORMAL HIGH (ref 11.5–14.6)
Sed Rate: 19 mm/hr (ref 0–22)
Sodium: 144 meq/L (ref 135–145)
Uric Acid, Serum: 6.4 mg/dL (ref 2.4–7.0)
WBC: 6.5 10*3/uL (ref 4.5–10.5)

## 2008-03-29 ENCOUNTER — Encounter: Payer: Self-pay | Admitting: Endocrinology

## 2008-04-09 ENCOUNTER — Ambulatory Visit: Payer: Self-pay | Admitting: Internal Medicine

## 2008-04-10 ENCOUNTER — Telehealth (INDEPENDENT_AMBULATORY_CARE_PROVIDER_SITE_OTHER): Payer: Self-pay | Admitting: *Deleted

## 2008-04-15 ENCOUNTER — Ambulatory Visit: Payer: Self-pay | Admitting: Internal Medicine

## 2008-04-19 ENCOUNTER — Ambulatory Visit: Payer: Self-pay | Admitting: Internal Medicine

## 2008-05-01 ENCOUNTER — Ambulatory Visit: Payer: Self-pay | Admitting: Internal Medicine

## 2008-05-07 ENCOUNTER — Telehealth: Payer: Self-pay | Admitting: Adult Health

## 2008-05-13 ENCOUNTER — Telehealth: Payer: Self-pay | Admitting: Cardiology

## 2008-05-17 ENCOUNTER — Ambulatory Visit: Payer: Self-pay | Admitting: Cardiovascular Disease

## 2008-06-11 ENCOUNTER — Telehealth (INDEPENDENT_AMBULATORY_CARE_PROVIDER_SITE_OTHER): Payer: Self-pay | Admitting: Physician Assistant

## 2008-06-11 ENCOUNTER — Encounter: Payer: Self-pay | Admitting: *Deleted

## 2008-06-12 ENCOUNTER — Telehealth: Payer: Self-pay | Admitting: Cardiology

## 2008-06-13 ENCOUNTER — Ambulatory Visit: Payer: Self-pay | Admitting: Cardiology

## 2008-06-13 ENCOUNTER — Encounter: Payer: Self-pay | Admitting: Cardiology

## 2008-06-13 ENCOUNTER — Inpatient Hospital Stay (HOSPITAL_COMMUNITY): Admission: AD | Admit: 2008-06-13 | Discharge: 2008-06-16 | Payer: Self-pay | Admitting: Cardiology

## 2008-06-17 ENCOUNTER — Telehealth: Payer: Self-pay | Admitting: Cardiology

## 2008-06-20 ENCOUNTER — Ambulatory Visit: Payer: Self-pay | Admitting: Internal Medicine

## 2008-06-20 DIAGNOSIS — R059 Cough, unspecified: Secondary | ICD-10-CM | POA: Insufficient documentation

## 2008-06-20 DIAGNOSIS — R05 Cough: Secondary | ICD-10-CM | POA: Insufficient documentation

## 2008-06-21 ENCOUNTER — Ambulatory Visit: Payer: Self-pay | Admitting: Internal Medicine

## 2008-06-28 ENCOUNTER — Encounter: Admission: RE | Admit: 2008-06-28 | Discharge: 2008-06-28 | Payer: Self-pay | Admitting: Obstetrics and Gynecology

## 2008-06-28 ENCOUNTER — Ambulatory Visit: Payer: Self-pay | Admitting: Internal Medicine

## 2008-07-01 ENCOUNTER — Ambulatory Visit: Payer: Self-pay | Admitting: Internal Medicine

## 2008-07-01 ENCOUNTER — Encounter: Payer: Self-pay | Admitting: *Deleted

## 2008-07-01 DIAGNOSIS — I4892 Unspecified atrial flutter: Secondary | ICD-10-CM | POA: Insufficient documentation

## 2008-07-08 ENCOUNTER — Ambulatory Visit: Payer: Self-pay | Admitting: Internal Medicine

## 2008-07-16 ENCOUNTER — Ambulatory Visit: Payer: Self-pay | Admitting: Cardiovascular Disease

## 2008-07-17 ENCOUNTER — Encounter: Payer: Self-pay | Admitting: *Deleted

## 2008-07-22 ENCOUNTER — Ambulatory Visit: Payer: Self-pay | Admitting: Internal Medicine

## 2008-07-22 DIAGNOSIS — R300 Dysuria: Secondary | ICD-10-CM | POA: Insufficient documentation

## 2008-07-22 LAB — CONVERTED CEMR LAB
Bilirubin Urine: NEGATIVE
Ketones, ur: NEGATIVE mg/dL
Nitrite: NEGATIVE
Specific Gravity, Urine: 1.025 (ref 1.000–1.030)
Total Protein, Urine: 30 mg/dL
Urine Glucose: NEGATIVE mg/dL
Urobilinogen, UA: 0.2 (ref 0.0–1.0)
pH: 6 (ref 5.0–8.0)

## 2008-07-23 ENCOUNTER — Ambulatory Visit: Payer: Self-pay | Admitting: Cardiology

## 2008-07-30 ENCOUNTER — Ambulatory Visit: Payer: Self-pay | Admitting: Cardiology

## 2008-08-05 ENCOUNTER — Telehealth: Payer: Self-pay | Admitting: Internal Medicine

## 2008-08-06 ENCOUNTER — Ambulatory Visit: Payer: Self-pay | Admitting: Internal Medicine

## 2008-08-06 DIAGNOSIS — N390 Urinary tract infection, site not specified: Secondary | ICD-10-CM | POA: Insufficient documentation

## 2008-08-06 LAB — CONVERTED CEMR LAB
Bilirubin Urine: NEGATIVE
Blood in Urine, dipstick: NEGATIVE
Glucose, Urine, Semiquant: NEGATIVE
Ketones, urine, test strip: NEGATIVE
Nitrite: NEGATIVE
Protein, U semiquant: NEGATIVE
Specific Gravity, Urine: <1.005
Urobilinogen, UA: 0.2
pH: 5

## 2008-08-07 ENCOUNTER — Ambulatory Visit: Payer: Self-pay | Admitting: Internal Medicine

## 2008-08-09 ENCOUNTER — Encounter: Payer: Self-pay | Admitting: Internal Medicine

## 2008-08-09 ENCOUNTER — Encounter (INDEPENDENT_AMBULATORY_CARE_PROVIDER_SITE_OTHER): Payer: Self-pay | Admitting: *Deleted

## 2008-08-09 ENCOUNTER — Ambulatory Visit: Payer: Self-pay | Admitting: Cardiology

## 2008-08-09 ENCOUNTER — Telehealth: Payer: Self-pay | Admitting: Internal Medicine

## 2008-08-10 ENCOUNTER — Encounter: Payer: Self-pay | Admitting: Internal Medicine

## 2008-08-11 LAB — CONVERTED CEMR LAB
BUN: 14 mg/dL (ref 6–23)
CO2: 26 meq/L (ref 19–32)
Calcium: 9 mg/dL (ref 8.4–10.5)
Chloride: 105 meq/L (ref 96–112)
Creatinine, Ser: 0.78 mg/dL (ref 0.40–1.20)
Glucose, Bld: 76 mg/dL (ref 70–99)
HCT: 38.6 % (ref 36.0–46.0)
Hemoglobin: 12.1 g/dL (ref 12.0–15.0)
MCHC: 31.3 g/dL (ref 30.0–36.0)
MCV: 93.2 fL (ref 78.0–100.0)
Platelets: 210 10*3/uL (ref 150–400)
Potassium: 4.2 meq/L (ref 3.5–5.3)
RBC: 4.14 M/uL (ref 3.87–5.11)
RDW: 17.1 % — ABNORMAL HIGH (ref 11.5–15.5)
Sodium: 144 meq/L (ref 135–145)
WBC: 6.4 10*3/uL (ref 4.0–10.5)

## 2008-08-12 ENCOUNTER — Ambulatory Visit: Payer: Self-pay | Admitting: Internal Medicine

## 2008-08-12 ENCOUNTER — Ambulatory Visit (HOSPITAL_COMMUNITY): Admission: RE | Admit: 2008-08-12 | Discharge: 2008-08-13 | Payer: Self-pay | Admitting: Internal Medicine

## 2008-08-15 ENCOUNTER — Ambulatory Visit: Payer: Self-pay | Admitting: Internal Medicine

## 2008-08-20 ENCOUNTER — Ambulatory Visit: Payer: Self-pay | Admitting: Cardiology

## 2008-08-22 ENCOUNTER — Encounter: Payer: Self-pay | Admitting: Cardiology

## 2008-08-26 ENCOUNTER — Encounter: Payer: Self-pay | Admitting: *Deleted

## 2008-08-28 ENCOUNTER — Encounter (INDEPENDENT_AMBULATORY_CARE_PROVIDER_SITE_OTHER): Payer: Self-pay | Admitting: *Deleted

## 2008-09-02 ENCOUNTER — Encounter (INDEPENDENT_AMBULATORY_CARE_PROVIDER_SITE_OTHER): Payer: Self-pay | Admitting: *Deleted

## 2008-09-02 ENCOUNTER — Ambulatory Visit: Payer: Self-pay | Admitting: Internal Medicine

## 2008-09-10 ENCOUNTER — Telehealth: Payer: Self-pay | Admitting: Internal Medicine

## 2008-09-11 ENCOUNTER — Ambulatory Visit: Payer: Self-pay | Admitting: Internal Medicine

## 2008-09-11 DIAGNOSIS — J019 Acute sinusitis, unspecified: Secondary | ICD-10-CM | POA: Insufficient documentation

## 2008-09-13 ENCOUNTER — Encounter: Payer: Self-pay | Admitting: Adult Health

## 2008-09-17 ENCOUNTER — Ambulatory Visit: Payer: Self-pay | Admitting: Cardiovascular Disease

## 2008-09-17 LAB — CONVERTED CEMR LAB
POC INR: 2.6
Prothrombin Time: 19.5 s

## 2008-09-20 ENCOUNTER — Encounter: Payer: Self-pay | Admitting: Adult Health

## 2008-09-24 ENCOUNTER — Ambulatory Visit: Payer: Self-pay | Admitting: Cardiology

## 2008-09-24 LAB — CONVERTED CEMR LAB: POC INR: 4

## 2008-10-02 ENCOUNTER — Telehealth: Payer: Self-pay | Admitting: Cardiology

## 2008-10-05 ENCOUNTER — Ambulatory Visit: Payer: Self-pay | Admitting: Family Medicine

## 2008-10-09 ENCOUNTER — Ambulatory Visit: Payer: Self-pay | Admitting: Internal Medicine

## 2008-10-09 LAB — CONVERTED CEMR LAB: POC INR: 3.4

## 2008-10-14 ENCOUNTER — Ambulatory Visit: Payer: Self-pay | Admitting: Internal Medicine

## 2008-10-14 LAB — CONVERTED CEMR LAB
ALT: 18 units/L (ref 0–35)
AST: 20 units/L (ref 0–37)
Albumin: 3.9 g/dL (ref 3.5–5.2)
Alkaline Phosphatase: 72 units/L (ref 39–117)
BUN: 14 mg/dL (ref 6–23)
Basophils Absolute: 0.1 10*3/uL (ref 0.0–0.1)
Basophils Relative: 1.1 % (ref 0.0–3.0)
Bilirubin Urine: NEGATIVE
Bilirubin, Direct: 0.1 mg/dL (ref 0.0–0.3)
CO2: 35 meq/L — ABNORMAL HIGH (ref 19–32)
Calcium: 8.8 mg/dL (ref 8.4–10.5)
Chloride: 105 meq/L (ref 96–112)
Cholesterol: 179 mg/dL (ref 0–200)
Creatinine, Ser: 1 mg/dL (ref 0.4–1.2)
Eosinophils Absolute: 0.2 10*3/uL (ref 0.0–0.7)
Eosinophils Relative: 3.6 % (ref 0.0–5.0)
GFR calc non Af Amer: 60.45 mL/min (ref >60)
Glucose, Bld: 82 mg/dL (ref 70–99)
HCT: 36.9 % (ref 36.0–46.0)
HDL: 35.6 mg/dL — ABNORMAL LOW (ref >39.00)
Hemoglobin, Urine: NEGATIVE
Hemoglobin: 12.6 g/dL (ref 12.0–15.0)
Ketones, ur: NEGATIVE mg/dL
LDL Cholesterol: 117 mg/dL — ABNORMAL HIGH (ref 0–99)
Lymphocytes Relative: 34.1 % (ref 12.0–46.0)
Lymphs Abs: 2.1 10*3/uL (ref 0.7–4.0)
MCHC: 34.1 g/dL (ref 30.0–36.0)
MCV: 85.7 fL (ref 78.0–100.0)
Monocytes Absolute: 0.5 10*3/uL (ref 0.1–1.0)
Monocytes Relative: 7.9 % (ref 3.0–12.0)
Neutro Abs: 3.4 10*3/uL (ref 1.4–7.7)
Neutrophils Relative %: 53.3 % (ref 43.0–77.0)
Nitrite: NEGATIVE
Platelets: 183 10*3/uL (ref 150.0–400.0)
Potassium: 4.4 meq/L (ref 3.5–5.1)
RBC: 4.31 M/uL (ref 3.87–5.11)
RDW: 16.3 % — ABNORMAL HIGH (ref 11.5–14.6)
Sodium: 142 meq/L (ref 135–145)
Specific Gravity, Urine: <=1.005 (ref 1.000–1.030)
TSH: 4.46 microintl units/mL (ref 0.35–5.50)
Total Bilirubin: 1.2 mg/dL (ref 0.3–1.2)
Total CHOL/HDL Ratio: 5
Total Protein, Urine: NEGATIVE mg/dL
Total Protein: 7.6 g/dL (ref 6.0–8.3)
Triglycerides: 132 mg/dL (ref 0.0–149.0)
Urine Glucose: NEGATIVE mg/dL
Urobilinogen, UA: 0.2 (ref 0.0–1.0)
VLDL: 26.4 mg/dL (ref 0.0–40.0)
WBC: 6.3 10*3/uL (ref 4.5–10.5)
pH: 6 (ref 5.0–8.0)

## 2008-10-15 ENCOUNTER — Telehealth (INDEPENDENT_AMBULATORY_CARE_PROVIDER_SITE_OTHER): Payer: Self-pay | Admitting: Physician Assistant

## 2008-10-23 ENCOUNTER — Ambulatory Visit: Payer: Self-pay | Admitting: Cardiology

## 2008-10-23 DIAGNOSIS — I4949 Other premature depolarization: Secondary | ICD-10-CM | POA: Insufficient documentation

## 2008-10-23 LAB — CONVERTED CEMR LAB: POC INR: 2.2

## 2008-10-28 ENCOUNTER — Ambulatory Visit: Payer: Self-pay | Admitting: Internal Medicine

## 2008-10-28 ENCOUNTER — Encounter: Payer: Self-pay | Admitting: Cardiology

## 2008-10-30 LAB — CONVERTED CEMR LAB
BUN: 12 mg/dL (ref 6–23)
CO2: 21 meq/L (ref 19–32)
Calcium: 8.9 mg/dL (ref 8.4–10.5)
Chloride: 104 meq/L (ref 96–112)
Creatinine, Ser: 0.87 mg/dL (ref 0.40–1.20)
Glucose, Bld: 72 mg/dL (ref 70–99)
Magnesium: 2.2 mg/dL (ref 1.5–2.5)
Potassium: 5 meq/L (ref 3.5–5.3)
Sodium: 143 meq/L (ref 135–145)

## 2008-11-18 ENCOUNTER — Ambulatory Visit: Payer: Self-pay | Admitting: Internal Medicine

## 2008-11-18 ENCOUNTER — Telehealth (INDEPENDENT_AMBULATORY_CARE_PROVIDER_SITE_OTHER): Payer: Self-pay | Admitting: *Deleted

## 2008-11-20 ENCOUNTER — Ambulatory Visit: Payer: Self-pay | Admitting: Cardiology

## 2008-11-20 LAB — CONVERTED CEMR LAB: POC INR: 4.3

## 2008-11-28 ENCOUNTER — Ambulatory Visit: Payer: Self-pay | Admitting: Cardiology

## 2008-11-28 LAB — CONVERTED CEMR LAB: POC INR: 3.6

## 2008-12-16 ENCOUNTER — Ambulatory Visit: Payer: Self-pay | Admitting: Cardiovascular Disease

## 2008-12-16 LAB — CONVERTED CEMR LAB: POC INR: 2.3

## 2009-01-13 ENCOUNTER — Ambulatory Visit: Payer: Self-pay | Admitting: Internal Medicine

## 2009-01-13 LAB — CONVERTED CEMR LAB: POC INR: 2.7

## 2009-01-16 ENCOUNTER — Ambulatory Visit: Payer: Self-pay | Admitting: Internal Medicine

## 2009-02-10 ENCOUNTER — Ambulatory Visit: Payer: Self-pay | Admitting: Internal Medicine

## 2009-02-10 LAB — CONVERTED CEMR LAB: INR: 5.6

## 2009-02-17 ENCOUNTER — Ambulatory Visit: Payer: Self-pay | Admitting: Internal Medicine

## 2009-02-17 LAB — CONVERTED CEMR LAB: POC INR: 1.4

## 2009-03-03 ENCOUNTER — Ambulatory Visit: Payer: Self-pay | Admitting: Cardiovascular Disease

## 2009-03-03 LAB — CONVERTED CEMR LAB: POC INR: 2.5

## 2009-03-05 ENCOUNTER — Telehealth: Payer: Self-pay | Admitting: Internal Medicine

## 2009-03-06 ENCOUNTER — Encounter: Payer: Self-pay | Admitting: Internal Medicine

## 2009-03-31 ENCOUNTER — Telehealth: Payer: Self-pay | Admitting: Internal Medicine

## 2009-03-31 ENCOUNTER — Ambulatory Visit: Payer: Self-pay | Admitting: Internal Medicine

## 2009-03-31 LAB — CONVERTED CEMR LAB: POC INR: 2.6

## 2009-04-18 ENCOUNTER — Ambulatory Visit: Payer: Self-pay | Admitting: Internal Medicine

## 2009-04-22 ENCOUNTER — Ambulatory Visit: Payer: Self-pay | Admitting: Cardiology

## 2009-04-28 ENCOUNTER — Ambulatory Visit: Payer: Self-pay | Admitting: Internal Medicine

## 2009-04-28 LAB — CONVERTED CEMR LAB: POC INR: 3.6

## 2009-05-12 ENCOUNTER — Ambulatory Visit: Payer: Self-pay | Admitting: Cardiovascular Disease

## 2009-05-12 LAB — CONVERTED CEMR LAB: POC INR: 2.6

## 2009-06-05 ENCOUNTER — Telehealth: Payer: Self-pay | Admitting: Internal Medicine

## 2009-06-10 ENCOUNTER — Emergency Department (HOSPITAL_COMMUNITY): Admission: EM | Admit: 2009-06-10 | Discharge: 2009-06-10 | Payer: Self-pay | Admitting: Emergency Medicine

## 2009-06-11 ENCOUNTER — Encounter: Payer: Self-pay | Admitting: Cardiovascular Disease

## 2009-06-11 ENCOUNTER — Telehealth: Payer: Self-pay | Admitting: Cardiology

## 2009-06-11 LAB — CONVERTED CEMR LAB
POC INR: 2.25
Prothrombin Time: 24.7 s

## 2009-06-12 ENCOUNTER — Telehealth: Payer: Self-pay | Admitting: Cardiology

## 2009-06-18 ENCOUNTER — Ambulatory Visit: Payer: Self-pay | Admitting: Internal Medicine

## 2009-06-18 DIAGNOSIS — S2239XA Fracture of one rib, unspecified side, initial encounter for closed fracture: Secondary | ICD-10-CM | POA: Insufficient documentation

## 2009-06-18 HISTORY — DX: Fracture of one rib, unspecified side, initial encounter for closed fracture: S22.39XA

## 2009-06-23 ENCOUNTER — Ambulatory Visit: Payer: Self-pay | Admitting: Cardiovascular Disease

## 2009-06-23 LAB — CONVERTED CEMR LAB: POC INR: 2.9

## 2009-07-07 ENCOUNTER — Ambulatory Visit: Payer: Self-pay | Admitting: Cardiovascular Disease

## 2009-07-07 LAB — CONVERTED CEMR LAB: POC INR: 2

## 2009-07-17 ENCOUNTER — Ambulatory Visit: Payer: Self-pay | Admitting: Internal Medicine

## 2009-07-18 ENCOUNTER — Encounter: Payer: Self-pay | Admitting: Adult Health

## 2009-07-28 ENCOUNTER — Telehealth: Payer: Self-pay | Admitting: Internal Medicine

## 2009-07-29 ENCOUNTER — Ambulatory Visit: Payer: Self-pay | Admitting: Internal Medicine

## 2009-07-29 DIAGNOSIS — M6282 Rhabdomyolysis: Secondary | ICD-10-CM | POA: Insufficient documentation

## 2009-07-29 DIAGNOSIS — R252 Cramp and spasm: Secondary | ICD-10-CM | POA: Insufficient documentation

## 2009-07-29 HISTORY — DX: Rhabdomyolysis: M62.82

## 2009-07-30 ENCOUNTER — Ambulatory Visit: Payer: Self-pay | Admitting: Cardiovascular Disease

## 2009-07-30 LAB — CONVERTED CEMR LAB
ALT: 29 units/L (ref 0–35)
AST: 32 units/L (ref 0–37)
Albumin: 3.9 g/dL (ref 3.5–5.2)
Alkaline Phosphatase: 80 units/L (ref 39–117)
BUN: 11 mg/dL (ref 6–23)
Basophils Absolute: 0 10*3/uL (ref 0.0–0.1)
Basophils Relative: 0.3 % (ref 0.0–3.0)
Bilirubin, Direct: 0.2 mg/dL (ref 0.0–0.3)
CO2: 33 meq/L — ABNORMAL HIGH (ref 19–32)
Calcium: 8.6 mg/dL (ref 8.4–10.5)
Chloride: 107 meq/L (ref 96–112)
Creatinine, Ser: 0.7 mg/dL (ref 0.4–1.2)
Eosinophils Absolute: 0.2 10*3/uL (ref 0.0–0.7)
Eosinophils Relative: 1.6 % (ref 0.0–5.0)
GFR calc non Af Amer: 89.5 mL/min (ref >60)
Glucose, Bld: 95 mg/dL (ref 70–99)
HCT: 36.1 % (ref 36.0–46.0)
Hemoglobin: 12.5 g/dL (ref 12.0–15.0)
Lymphocytes Relative: 26.6 % (ref 12.0–46.0)
Lymphs Abs: 2.6 10*3/uL (ref 0.7–4.0)
MCHC: 34.7 g/dL (ref 30.0–36.0)
MCV: 88.1 fL (ref 78.0–100.0)
Magnesium: 2.4 mg/dL (ref 1.5–2.5)
Monocytes Absolute: 0.8 10*3/uL (ref 0.1–1.0)
Monocytes Relative: 8.1 % (ref 3.0–12.0)
Neutro Abs: 6.1 10*3/uL (ref 1.4–7.7)
Neutrophils Relative %: 63.4 % (ref 43.0–77.0)
POC INR: 3.1
Platelets: 209 10*3/uL (ref 150.0–400.0)
Potassium: 4.1 meq/L (ref 3.5–5.1)
RBC: 4.1 M/uL (ref 3.87–5.11)
RDW: 17 % — ABNORMAL HIGH (ref 11.5–14.6)
Sodium: 143 meq/L (ref 135–145)
TSH: 2.45 microintl units/mL (ref 0.35–5.50)
Total Bilirubin: 1.2 mg/dL (ref 0.3–1.2)
Total CK: 556 units/L — ABNORMAL HIGH (ref 7–177)
Total Protein: 6.5 g/dL (ref 6.0–8.3)
WBC: 9.7 10*3/uL (ref 4.5–10.5)

## 2009-08-01 ENCOUNTER — Ambulatory Visit: Payer: Self-pay | Admitting: Internal Medicine

## 2009-08-01 LAB — CONVERTED CEMR LAB: Total CK: 766 U/L — ABNORMAL HIGH

## 2009-08-05 ENCOUNTER — Ambulatory Visit: Payer: Self-pay | Admitting: Internal Medicine

## 2009-08-05 LAB — CONVERTED CEMR LAB: Total CK: 110 units/L (ref 7–177)

## 2009-08-07 ENCOUNTER — Encounter: Admission: RE | Admit: 2009-08-07 | Discharge: 2009-08-07 | Payer: Self-pay | Admitting: Obstetrics and Gynecology

## 2009-08-08 ENCOUNTER — Telehealth: Payer: Self-pay | Admitting: Internal Medicine

## 2009-08-20 ENCOUNTER — Ambulatory Visit: Payer: Self-pay | Admitting: Cardiovascular Disease

## 2009-08-20 LAB — CONVERTED CEMR LAB: POC INR: 3.1

## 2009-09-16 ENCOUNTER — Ambulatory Visit: Payer: Self-pay | Admitting: Internal Medicine

## 2009-09-16 DIAGNOSIS — R079 Chest pain, unspecified: Secondary | ICD-10-CM | POA: Insufficient documentation

## 2009-09-17 ENCOUNTER — Ambulatory Visit: Payer: Self-pay | Admitting: Cardiovascular Disease

## 2009-09-17 LAB — CONVERTED CEMR LAB: POC INR: 3.3

## 2009-10-08 ENCOUNTER — Ambulatory Visit: Payer: Self-pay | Admitting: Cardiology

## 2009-10-08 LAB — CONVERTED CEMR LAB: POC INR: 3

## 2009-10-16 ENCOUNTER — Encounter: Payer: Self-pay | Admitting: Internal Medicine

## 2009-10-16 ENCOUNTER — Ambulatory Visit: Payer: Self-pay | Admitting: Internal Medicine

## 2009-10-16 LAB — CONVERTED CEMR LAB
ALT: 19 units/L (ref 0–35)
AST: 21 units/L (ref 0–37)
Albumin: 3.7 g/dL (ref 3.5–5.2)
Alkaline Phosphatase: 81 units/L (ref 39–117)
BUN: 13 mg/dL (ref 6–23)
Basophils Absolute: 0 10*3/uL (ref 0.0–0.1)
Basophils Relative: 0.6 % (ref 0.0–3.0)
Bilirubin Urine: NEGATIVE
Bilirubin, Direct: 0.1 mg/dL (ref 0.0–0.3)
CO2: 32 meq/L (ref 19–32)
Calcium: 8.7 mg/dL (ref 8.4–10.5)
Chloride: 101 meq/L (ref 96–112)
Cholesterol: 197 mg/dL (ref 0–200)
Creatinine, Ser: 0.9 mg/dL (ref 0.4–1.2)
Eosinophils Absolute: 0.1 10*3/uL (ref 0.0–0.7)
Eosinophils Relative: 2.7 % (ref 0.0–5.0)
GFR calc non Af Amer: 72.67 mL/min (ref >60)
Glucose, Bld: 110 mg/dL — ABNORMAL HIGH (ref 70–99)
HCT: 35.6 % — ABNORMAL LOW (ref 36.0–46.0)
HDL: 42.7 mg/dL (ref >39.00)
Hemoglobin, Urine: NEGATIVE
Hemoglobin: 12.6 g/dL (ref 12.0–15.0)
Ketones, ur: NEGATIVE mg/dL
LDL Cholesterol: 132 mg/dL — ABNORMAL HIGH (ref 0–99)
Lymphocytes Relative: 32.7 % (ref 12.0–46.0)
Lymphs Abs: 1.7 10*3/uL (ref 0.7–4.0)
MCHC: 35.3 g/dL (ref 30.0–36.0)
MCV: 84.9 fL (ref 78.0–100.0)
Monocytes Absolute: 0.4 10*3/uL (ref 0.1–1.0)
Monocytes Relative: 6.8 % (ref 3.0–12.0)
Neutro Abs: 3 10*3/uL (ref 1.4–7.7)
Neutrophils Relative %: 57.2 % (ref 43.0–77.0)
Nitrite: NEGATIVE
Platelets: 211 10*3/uL (ref 150.0–400.0)
Potassium: 4.1 meq/L (ref 3.5–5.1)
RBC: 4.19 M/uL (ref 3.87–5.11)
RDW: 16.9 % — ABNORMAL HIGH (ref 11.5–14.6)
Sodium: 140 meq/L (ref 135–145)
Specific Gravity, Urine: 1.015 (ref 1.000–1.030)
TSH: 4.53 microintl units/mL (ref 0.35–5.50)
Total Bilirubin: 1.1 mg/dL (ref 0.3–1.2)
Total CHOL/HDL Ratio: 5
Total Protein, Urine: NEGATIVE mg/dL
Total Protein: 6.4 g/dL (ref 6.0–8.3)
Triglycerides: 111 mg/dL (ref 0.0–149.0)
Urine Glucose: NEGATIVE mg/dL
Urobilinogen, UA: 1 (ref 0.0–1.0)
VLDL: 22.2 mg/dL (ref 0.0–40.0)
WBC: 5.3 10*3/uL (ref 4.5–10.5)
pH: 7 (ref 5.0–8.0)

## 2009-10-22 ENCOUNTER — Encounter: Payer: Self-pay | Admitting: Internal Medicine

## 2009-10-22 ENCOUNTER — Ambulatory Visit: Payer: Self-pay | Admitting: Internal Medicine

## 2009-10-30 ENCOUNTER — Ambulatory Visit: Payer: Self-pay | Admitting: Cardiology

## 2009-11-04 ENCOUNTER — Telehealth: Payer: Self-pay | Admitting: Internal Medicine

## 2009-11-05 ENCOUNTER — Ambulatory Visit: Payer: Self-pay | Admitting: Cardiovascular Disease

## 2009-11-05 LAB — CONVERTED CEMR LAB: POC INR: 2.3

## 2009-11-06 ENCOUNTER — Telehealth: Payer: Self-pay | Admitting: Internal Medicine

## 2009-11-11 ENCOUNTER — Encounter: Payer: Self-pay | Admitting: Internal Medicine

## 2009-11-12 ENCOUNTER — Encounter: Payer: Self-pay | Admitting: Internal Medicine

## 2009-11-25 ENCOUNTER — Ambulatory Visit: Payer: Self-pay | Admitting: Internal Medicine

## 2009-11-27 ENCOUNTER — Ambulatory Visit: Payer: Self-pay | Admitting: Internal Medicine

## 2009-11-27 DIAGNOSIS — M25539 Pain in unspecified wrist: Secondary | ICD-10-CM | POA: Insufficient documentation

## 2009-12-03 ENCOUNTER — Ambulatory Visit: Payer: Self-pay | Admitting: Cardiovascular Disease

## 2009-12-03 LAB — CONVERTED CEMR LAB: POC INR: 3.2

## 2009-12-08 ENCOUNTER — Telehealth: Payer: Self-pay | Admitting: Cardiology

## 2009-12-16 ENCOUNTER — Ambulatory Visit: Payer: Self-pay | Admitting: Internal Medicine

## 2009-12-18 ENCOUNTER — Telehealth: Payer: Self-pay | Admitting: Internal Medicine

## 2009-12-24 ENCOUNTER — Ambulatory Visit: Payer: Self-pay | Admitting: Cardiovascular Disease

## 2009-12-24 LAB — CONVERTED CEMR LAB: POC INR: 3.8

## 2010-01-07 ENCOUNTER — Ambulatory Visit: Payer: Self-pay | Admitting: Cardiovascular Disease

## 2010-01-07 LAB — CONVERTED CEMR LAB: POC INR: 3.6

## 2010-01-21 ENCOUNTER — Ambulatory Visit: Admission: RE | Admit: 2010-01-21 | Discharge: 2010-01-21 | Payer: Self-pay | Source: Home / Self Care

## 2010-01-21 LAB — CONVERTED CEMR LAB: POC INR: 2.1

## 2010-02-10 NOTE — Progress Notes (Signed)
Summary: FUROSEMIDE RX  Phone Note Call from Patient Call back at Home Phone [phone]   Caller: [phone] Call For: [facility] Summary of Call: per pt call need her medication for FUROSEMIDE 20 MG  TABS (FUROSEMIDE) 1 extra daily as needed sent to [facility] Initial call taken by: Shelbie Proctor,  August 01, 2007 10:58 AM  Follow-up for Phone Call        sent  Follow-up by: Rock Nephew CMA,  August 02, 2007 10:04 AM      Prescriptions: FUROSEMIDE 20 MG  TABS (FUROSEMIDE) 1 extra daily as needed  #90 x 3   Entered by:   Rock Nephew CMA   Authorized by:   Corwin Levins MD   Signed by:   Rock Nephew CMA on 08/02/2007   Method used:   Faxed to ...       [facility]             , [place]         Ph:        Fax: [phone]   RxID:   [id number] LASIX 40 MG TABS (FUROSEMIDE) Take 1 tablet by mouth once a day  #90 x 30   Entered by:   Rock Nephew CMA   Authorized by:   Corwin Levins MD   Signed by:   Rock Nephew CMA on 08/02/2007   Method used:   Faxed to ...       [facility]             , [place]         Ph:        Fax: [phone]   RxID:   [id number]

## 2010-02-10 NOTE — Assessment & Plan Note (Signed)
Summary: follow up/ mbw   PCP:  Jonny Ruiz  Chief Complaint:  1 month follow up.  History of Present Illness: 60 yo white female former smoker with documented COPD with minimum asthmatic component with an FEV1 of 55%, a ratio of 53%, and a diffusing capacity 51% documented 07/25/06.  At that point she weighed 271 and it was felt she was as debilitated as much by obesityas by airflow obstruction.   states that generally doing better she works outside in her garden.  No canal every better definition is as follows:when well = walk to mother in law's house but stops at least once because it's uphill and does at [employer]. sometimes [employer] parking.  Doing well with no new complaints. Plans on joining [employer]. Is trying to walk more with oxygen.  Denies chest pain, dyspnea, orthopnea, hemoptysis, fever, n/v/d, edema.      Prior Medication List:  LASIX 40 MG TABS (FUROSEMIDE) Take 1 tablet by mouth once a day COUMADIN 7.5 MG TABS (WARFARIN SODIUM) Take 1 tablet by mouth once a day, 11mg  on Mondays KLOR-CON 10 10 MEQ TBCR (POTASSIUM CHLORIDE) Take 1 tablet by mouth once a day FLUOXETINE HCL 40 MG CAPS (FLUOXETINE HCL) Take 1 tablet by mouth once a day OMEPRAZOLE 20 MG  CPDR (OMEPRAZOLE) Take 1 capsule by mouth once a day RYTHMOL SR 325 MG CP12 (PROPAFENONE HCL) two times a day VERAMYST 27.5 MCG/SPRAY  SUSP (FLUTICASONE FUROATE) one spray two times a day SYMBICORT 160-4.5 MCG/ACT  AERO (BUDESONIDE-FORMOTEROL FUMARATE) Two puffs twice daily AMLODIPINE BESYLATE 10 MG  TABS (AMLODIPINE BESYLATE) 1 tab by mouth once daily DIOVAN 320 MG  TABS (VALSARTAN) 1 by mouth daily SPIRIVA HANDIHALER 18 MCG  CAPS (TIOTROPIUM BROMIDE MONOHYDRATE) Inhale contents of 1 capsule once a day * OXYGEN 2L continuous XOPENEX 1.25 MG/3ML  NEBU (LEVALBUTEROL HCL) one every 6 hours as needed XOPENEX HFA 45 MCG/ACT  AERO (LEVALBUTEROL TARTRATE) inhale 2 puffs every 4 hours as needed  TYLENOL 325 MG  TABS (ACETAMINOPHEN) per bottle MUCINEX DM 30-600 MG  TB12 (DEXTROMETHORPHAN-GUAIFENESIN) 1-2 every 12 hours as needed ULTRAM 50 MG  TABS (TRAMADOL HCL) one every four hours as needed ZYRTEC ALLERGY 10 MG  TABS (CETIRIZINE HCL) one tab at bedtime as needed [facility] SALINE NASAL SPRAY 0.65 %  SOLN (SALINE) 2 puffs every 4 hours as needed AFRIN NASAL SPRAY 0.05 %  SOLN (OXYMETAZOLINE HCL) 1 puff two times a day x5 days FUROSEMIDE 20 MG  TABS (FUROSEMIDE) 1 extra daily as needed LEVAQUIN 750 MG  TABS (LEVOFLOXACIN) One tablet by mouth daily   Current Allergies (reviewed today): ! PCN  Past Medical History:    Reviewed history from 04/26/2007 and no changes required:       CHRONIC RHINITIS (ICD-472.0)       EDEMA (ICD-782.3)       OBSTRUCTIVE CHRONIC BRONCHITIS WITH EXACERBATION (ICD-491.21)       ACUTE AND CHRONIC RESPIRATORY FAILURE (ICD-518.84)       MORBID OBESITY (ICD-278.01)       DEEP VENOUS THROMBOPHLEBITIS, HX OF (ICD-V12.52)       COPD (ICD-496)       DEPRESSION (ICD-311)       OSTEOPOROSIS (ICD-733.00)       HYPERTENSION (ICD-401.9)           Family History:    Reviewed history from 01/19/2007 and no changes required:       asthma and brothers and son.  Heart disease in mother.  Throat cancer in brother.  Social History:    Reviewed history from 03/01/2007 and no changes required:       Patient states former smoker quit smoking in 2003   Risk Factors: Tobacco use:  quit    Year quit:  1998    Pack-years:  57yrs, 1ppd  Colonoscopy History:    Date of Last Colonoscopy:  02/13/2004  Mammogram History:    Date of Last Mammogram:  01/11/1998  PAP Smear History:    Date of Last PAP Smear:  01/11/1998   Review of Systems      See HPI   Vital Signs:  Patient Profile:   60 Years Old Female Height:     66 inches Weight:      280 pounds O2 Sat:      98 % O2 treatment:    2L Temp:     97.3 degrees F oral Pulse rate:   70 / minute  BP sitting:   136 / 88  (left arm) Cuff size:   large  Vitals Entered By: Boone Master CNA (May 26, 2007 9:37 AM)             Is Patient Diabetic? No Comments Medications reviewed with patient Boone Master CNA  May 26, 2007 9:37 AM      Physical Exam  in general she is a massively obese  white female oxygen dependent but still able to get up on the exam table without help. HEENT mild turbinate edema.  Oropharynx no thrush or excess pnd or cobblestoning.  No JVD or cervical adenopathy. Mild accessory muscle hypertrophy. Trachea midline, nl thryroid. Chest was hyperinflated by percussion with diminished breath sounds and moderate increased exp time without wheeze. Hoover sign positive at mid inspiration. Regular rate and rhythm without murmur gallop or rub or increase P2.  Abd: no hsm, nl excursion. Ext warm without C,C or E.       Impression & Recommendations:  Problem # 1:  COPD (ICD-496) Well compensated on present regiemn.  follow up 4-6 weeks and as needed  Her updated medication list for this problem includes:    Symbicort 160-4.5 Mcg/act Aero (Budesonide-formoterol fumarate) .Marland Kitchen..Marland Kitchen Two puffs twice daily    Spiriva Handihaler 18 Mcg Caps (Tiotropium bromide monohydrate) ..... Inhale contents of 1 capsule once a day    Xopenex 1.25 Mg/44ml Nebu (Levalbuterol hcl) ..... One every 6 hours as needed    Xopenex Hfa 45 Mcg/act Aero (Levalbuterol tartrate) ..... Inhale 2 puffs every 4 hours as needed    Levaquin 750 Mg Tabs (Levofloxacin) ..... One tablet by mouth daily  Orders: Est. Patient Level III (95621)   Problem # 2:  HYPERTENSION (ICD-401.9) Well controlled  wt loss, low salt diet.  Her updated medication list for this problem includes:    Lasix 40 Mg Tabs (Furosemide) .Marland Kitchen... Take 1 tablet by mouth once a day    Amlodipine Besylate 10 Mg Tabs (Amlodipine besylate) .Marland Kitchen... 1 tab by mouth once daily    Diovan 320 Mg Tabs (Valsartan) .Marland Kitchen... 1 by mouth daily     Furosemide 20 Mg Tabs (Furosemide) .Marland Kitchen... 1 extra daily as needed   Problem # 3:  CHRONIC RHINITIS (ICD-472.0) Continue same meds Saline nasal as needed  Orders: Est. Patient Level III (30865)   Problem # 4:  EDEMA (ICD-782.3) Well compensated on present regimen.  Orders: Est. Patient Level III (78469)    Patient Instructions: 1)  See calendar for specific medication instructions and bring it  back for each and every office visit for every healthcare provider you see.  Without it,  you may not receive the best quality medical care that we feel you deserve. 2)  work on inhaler inhaler 3)  Please schedule a follow-up appointment in 4-6 weeks Dr. Sherene Sires  4)  Weight loss, exercise, low salt.    ]

## 2010-02-10 NOTE — Medication Information (Signed)
Summary: rov/ewj  Anticoagulant Therapy  Managed by: Cloyde Reams, RN, BSN Referring MD: Valera Castle MD PCP: Corwin Levins MD Supervising MD: Mariah Milling Indication 1: Pulmonary embolism (ICD-415.19) Lab Used: [facility] Site: [place] INR POC 3.3 INR RANGE 2 - 3  Dietary changes: no    Health status changes: no    Bleeding/hemorrhagic complications: no    Recent/future hospitalizations: no    Any changes in medication regimen? no    Recent/future dental: no  Any missed doses?: no       Is patient compliant with meds? yes       Allergies: 1)  ! Pcn 2)  * Cymbalta  Anticoagulation Management History:      The patient is taking warfarin and comes in today for a routine follow up visit.  Negative risk factors for bleeding include an age less than 13 years old.  The bleeding index is 'low risk'.  Positive CHADS2 values include History of HTN.  Negative CHADS2 values include Age > 34 years old.  The start date was 06/13/2008.  Her last INR was 5.6.  Anticoagulation responsible provider: gollan.  INR POC: 3.3.  Cuvette Lot#: [id number].  Exp: 10/2010.    Anticoagulation Management Assessment/Plan:      The patient's current anticoagulation dose is Coumadin 7.5 mg tabs: Take asd 1 tablet by mouth once a day.  The target INR is 2 - 3.  The next INR is due 10/08/2009.  Anticoagulation instructions were given to patient.  Results were reviewed/authorized by Cloyde Reams, RN, BSN.  She was notified by Cloyde Reams RN.         Prior Anticoagulation Instructions: INR 3.1  Take 1/2 tablet today, then resume dosage 1.5 tablets daily except 1 tablet on Sundays, Wednesdays and Fridays.  Recheck in 4 weeks.   Current Anticoagulation Instructions: INR 3.3  Skip today's dosage of Coumadin, then start taking 1 tablet daily except 1.5 tablets on Tuesdays, Thursdays, and Saturdays.  Recheck in 3 weeks.

## 2010-02-10 NOTE — Progress Notes (Signed)
Summary: uti?  Phone Note Call from Patient Call back at Home Phone [phone]   Caller: Patient/[phone] Call For: dr Jonny Ruiz Details for Reason: UTI Summary of Call: per pt c/o of bladder infection no blood ,c/o burning  alot of Urinary urgency  can dr Jonny Ruiz call in a medication for it  [facility]  [address] [phone]* (retail) [address] [place], [place]  [address] Ph: [phone] or [phone] Fax: [phone] Initial call taken by: Shelbie Proctor,  October 05, 2007 10:42 AM  Follow-up for Phone Call        ok this time only - done escript Follow-up by: Corwin Levins MD,  October 05, 2007 11:17 AM  Additional Follow-up for Phone Call Additional follow up Details #1::        October 05, 2007 2:13 PM called pt to inform rhat rx was sent to pharmacy pt made aware Additional Follow-up by: Shelbie Proctor,  October 05, 2007 2:13 PM    New/Updated Medications: CIPROFLOXACIN HCL 500 MG TABS (CIPROFLOXACIN HCL) 1po two times a day   Prescriptions: CIPROFLOXACIN HCL 500 MG TABS (CIPROFLOXACIN HCL) 1po two times a day  #20 x 0   Entered and Authorized by:   Corwin Levins MD   Signed by:   Corwin Levins MD on 10/05/2007   Method used:   [facility]        [facility]  [address] [phone]* (retail)       [address]       [place], [place]  [address]       Ph: [phone] or [phone]       Fax: [phone]   RxID:   [id number]

## 2010-02-10 NOTE — Medication Information (Signed)
Summary: New medication request for [facility]  New medication request for [facility]   Imported By: Sherian Rein 03/11/2009 12:14:47  _____________________________________________________________________  External Attachment:    Type:   Image     Comment:   External Document

## 2010-02-10 NOTE — Assessment & Plan Note (Signed)
Summary: MED CALENDAR/ MBW  Medications Added OMEPRAZOLE 20 MG  CPDR (OMEPRAZOLE) Take 1 capsule by mouth once a day AMLODIPINE BESYLATE 10 MG  TABS (AMLODIPINE BESYLATE) 1/2 tab by mouth once daily DIOVAN 160 MG  TABS (VALSARTAN) 2 tabs by mouth once daily SPIRIVA HANDIHALER 18 MCG  CAPS (TIOTROPIUM BROMIDE MONOHYDRATE) Inhale contents of 1 capsule once a day * OXYGEN 2L continuous XOPENEX HFA 45 MCG/ACT  AERO (LEVALBUTEROL TARTRATE) inhale 2 puffs every 4 hours as needed TYLENOL 325 MG  TABS (ACETAMINOPHEN) per bottle MUCINEX DM 30-600 MG  TB12 (DEXTROMETHORPHAN-GUAIFENESIN) 1-2 every 12 hours as needed [facility] SALINE NASAL SPRAY 0.65 %  SOLN (SALINE) 2 puffs every 4 hours as needed AFRIN NASAL SPRAY 0.05 %  SOLN (OXYMETAZOLINE HCL) 1 puff two times a day x5 days FUROSEMIDE 20 MG  TABS (FUROSEMIDE) 1 extra daily as needed        PCP:  John   History of Present Illness: 60 yo white female former smoker with documented COPD with minimum asthmatic component debilitated as much by obesityas by airflow obstruction. Recent bronchitic flare treated with Doxcycline with resolution of symptoms. Dysnea decreased with increased activity tolerance.  Walked to [place]/in[place]-148ft without stopping.  Pt denies any significant sore throat, nasal congestion or excess secretions, fever, chills, sweats, unintended wt loss, pleuritic or exertional cp, orthopnea pnd.  Continue to have ankle edema despite lasix 40mg  once daily.  Brought meds for review-correct with list.       Prior Medication List:  LASIX 40 MG TABS (FUROSEMIDE) Take 1 tablet by mouth once a day COUMADIN 7.5 MG TABS (WARFARIN SODIUM) Take 1 tablet by mouth once a day, 11mg  on Mondays KLOR-CON 10 10 MEQ TBCR (POTASSIUM CHLORIDE) Take 1 tablet by mouth once a day FLUOXETINE HCL 40 MG CAPS (FLUOXETINE HCL) Take 1 tablet by mouth once a day PROTONIX 40 MG  TBEC (PANTOPRAZOLE SODIUM) two times a day  RYTHMOL SR 325 MG CP12 (PROPAFENONE HCL) two times a day VERAMYST 27.5 MCG/SPRAY  SUSP (FLUTICASONE FUROATE) one spray two times a day SYMBICORT 80-4.5 MCG/ACT  AERO (BUDESONIDE-FORMOTEROL FUMARATE) two puffs two times a day AMLODIPINE BESYLATE 10 MG  TABS (AMLODIPINE BESYLATE) once daily DIOVAN 160 MG  TABS (VALSARTAN) once daily SPIRIVA HANDIHALER 18 MCG  CAPS (TIOTROPIUM BROMIDE MONOHYDRATE) once daily XOPENEX 1.25 MG/3ML  NEBU (LEVALBUTEROL HCL) one every 6 hours as needed DOXYCYCLINE MONOHYDRATE 100 MG  CAPS (DOXYCYCLINE MONOHYDRATE) By mouth twice daily PREDNISONE 10 MG  TABS (PREDNISONE) 4x2, 3x2, 2x2,1x2 LEVAQUIN 750 MG  TABS (LEVOFLOXACIN) One tablet by mouth daily DOXYCYCLINE MONOHYDRATE 100 MG  CAPS (DOXYCYCLINE MONOHYDRATE) 1 by mouth two times a day ULTRAM 50 MG  TABS (TRAMADOL HCL) one every four hours as needed ZYRTEC ALLERGY 10 MG  TABS (CETIRIZINE HCL) one tab at bedtime as needed   Current Allergies (reviewed today): ! PCN  Past Medical History:    Reviewed history from 03/01/2007 and no changes required:              OBSTRUCTIVE CHRONIC BRONCHITIS WITH EXACERBATION (ICD-491.21)       ACUTE AND CHRONIC RESPIRATORY FAILURE (ICD-518.84)       MORBID OBESITY (ICD-278.01)       DEEP VENOUS THROMBOPHLEBITIS, HX OF (ICD-V12.52)       COPD (ICD-496) FEV1 55% predicted ratio 53% and no improvement after bronchodilators by PFTs 07/25/06 with disproportionate reduction and ERV at 28%       DEPRESSION (ICD-311)  OSTEOPOROSIS (ICD-733.00)       HYPERTENSION (ICD-401.9)           Family History:    Reviewed history from 01/19/2007 and no changes required:       asthma and brothers and son.  Heart disease in mother.  Throat cancer in brother.  Social History:    Reviewed history from 03/01/2007 and no changes required:       Patient states former smoker quit smoking in 2003   Risk Factors: Tobacco use:  quit    Year quit:  1998    Pack-years:  71yrs, 1ppd   Colonoscopy History:    Date of Last Colonoscopy:  02/13/2004  Mammogram History:    Date of Last Mammogram:  01/11/1998  PAP Smear History:    Date of Last PAP Smear:  01/11/1998   Review of Systems      See HPI   Vital Signs:  Patient Profile:   60 Years Old Female Height:     66 inches Weight:      282.13 pounds O2 Sat:      97 % O2 treatment:    2L Temp:     98.3 degrees F oral Pulse rate:   83 / minute BP sitting:   126 / 64  (left arm) Cuff size:   large  Vitals Entered By: Boone Master CNA (March 17, 2007 9:00 AM)             Is Patient Diabetic? No Comments Medications reviewed with patient ..................................................................Marland KitchenBoone Master CNA  March 17, 2007 9:00 AM      Physical Exam  No acute distress with stable vital signs. HEENT:  Unremarkable.  Oropharynx is clear. LUNG FIELDS:  Coarse breath sounds no wheezing HEART:  There is a regular rhythm without murmur, gallop, rub. ABDOMEN:  Soft, non-tender EXTREMITIES:  Warm without calf tenderness, cyanosis, clubbing, tr. -1+ edema        Impression & Recommendations:  Problem # 1:  EDEMA (ICD-782.3) Possibly adverse med effect. Decrease Amlodipine 10mg  1/2 once daily  follow up 3-4 Dr. Sherene Sires and as needed  Meds reviewed with pt. ed given, med list adjusted and reviewed.  Please contact office for sooner follow up if symptoms do not improve or worsen  Orders: Est. Patient Level IV (16109)   Problem # 2:  COPD (ICD-496) continue on present regimen.  The following medications were removed from the medication list:    Doxycycline Monohydrate 100 Mg Caps (Doxycycline monohydrate) ..... By mouth twice daily    Prednisone 10 Mg Tabs (Prednisone) .Marland Kitchen... 4x2, 3x2, 2x2,1x2    Levaquin 750 Mg Tabs (Levofloxacin) ..... One tablet by mouth daily    Doxycycline Monohydrate 100 Mg Caps (Doxycycline monohydrate) .Marland Kitchen... 1 by mouth two times a day   Her updated medication list for this problem includes:    Symbicort 80-4.5 Mcg/act Aero (Budesonide-formoterol fumarate) .Marland Kitchen..Marland Kitchen Two puffs two times a day    Spiriva Handihaler 18 Mcg Caps (Tiotropium bromide monohydrate) ..... Inhale contents of 1 capsule once a day    Xopenex 1.25 Mg/11ml Nebu (Levalbuterol hcl) ..... One every 6 hours as needed    Xopenex Hfa 45 Mcg/act Aero (Levalbuterol tartrate) ..... Inhale 2 puffs every 4 hours as needed  Orders: Est. Patient Level IV (60454)   Problem # 3:  HYPERTENSION (ICD-401.9) Increase Diovan 160mg  2 tabs once daily  Decrease Amlodipine 10mg  1/2 once daily  follow up 3-4 Dr. Sherene Sires and as needed  Please contact office for sooner follow up if symptoms do not improve or worsen  Her updated medication list for this problem includes:    Lasix 40 Mg Tabs (Furosemide) .Marland Kitchen... Take 1 tablet by mouth once a day    Amlodipine Besylate 10 Mg Tabs (Amlodipine besylate) .Marland Kitchen... 1/2 tab by mouth once daily    Diovan 160 Mg Tabs (Valsartan) .Marland Kitchen... 2 tabs by mouth once daily    Furosemide 20 Mg Tabs (Furosemide) .Marland Kitchen... 1 extra daily as needed  Orders: Est. Patient Level IV (60454)   Medications Added to Medication List This Visit: 1)  Omeprazole 20 Mg Cpdr (Omeprazole) .... Take 1 capsule by mouth once a day 2)  Amlodipine Besylate 10 Mg Tabs (Amlodipine besylate) .... 1/2 tab by mouth once daily 3)  Diovan 160 Mg Tabs (Valsartan) .... 2 tabs by mouth once daily 4)  Spiriva Handihaler 18 Mcg Caps (Tiotropium bromide monohydrate) .... Inhale contents of 1 capsule once a day 5)  Oxygen 2l  .... Continuous 6)  Xopenex Hfa 45 Mcg/act Aero (Levalbuterol tartrate) .... Inhale 2 puffs every 4 hours as needed 7)  Tylenol 325 Mg Tabs (Acetaminophen) .... Per bottle 8)  Mucinex Dm 30-600 Mg Tb12 (Dextromethorphan-guaifenesin) .Marland Kitchen.. 1-2 every 12 hours as needed 9)  [facility] Saline Nasal Spray 0.65 % Soln (Saline) .... 2 puffs every 4 hours as needed  10)  Afrin Nasal Spray 0.05 % Soln (Oxymetazoline hcl) .Marland Kitchen.. 1 puff two times a day x5 days 11)  Furosemide 20 Mg Tabs (Furosemide) .Marland Kitchen.. 1 extra daily as needed   Patient Instructions: 1)  Increase Diovan 160mg  2 tabs once daily  2)  Decrease Amlodipine 10mg  1/2 once daily  3)  Tylenol and Tramadol as needed pain.  4)  follow up 3-4 Dr. Sherene Sires and as needed  5)  Please contact office for sooner follow up if symptoms do not improve or worsen     ]

## 2010-02-10 NOTE — Assessment & Plan Note (Signed)
Summary: FLU/ MBW  Medications Added DOXYCYCLINE MONOHYDRATE 100 MG  CAPS (DOXYCYCLINE MONOHYDRATE) By mouth twice daily PREDNISONE 10 MG  TABS (PREDNISONE) 4x2, 3x2, 2x2,1x2        Chief Complaint:  REVIEWED MEDS, COUGHING , SPUTUM GREEN, SINUS PRESSURE, SORE THROAT, and EARACHE .  History of Present Illness: 60 year old white female who quit smoking in 2003 with a baseline FEV1 of 55% predicted documented July 2008 with minimal reversibility and marked reduction in ERV consistent with a cast component of obesity/restriction.  At baseline she is O2 dependent but not steroid dependent maintained on a combination of Spiriva and Symbicort.  She comes in today acutely illwith sore throat, hacking cough productive of green sputum, and increasing dyspnea with ear in sinus congestion for the last 5 days.  She denies however any pleuritic pain fevers chills sweats orthopnea PND or leg swelling.  Pt also denies any obvious fluctuation in symptoms with weather or environmental change or other alleviating or aggravating factors.    She gets some relief from her HFA Xopenex but is not for some reason used it yet today.  She denies any nonadherence with her maintenance regimen which is reflected in her ambiguous and use her from the medication to her that she did bring with her to the office today.   Current Allergies: ! PCN  Past Medical History:    Hypertension    Osteoporosis    Morbid Obesity    DVT    Depression    COPD FEV 1 55% 7/08   Family History:    Reviewed history and no changes required:  Social History:    Patient states former smoker.    Risk Factors:  Tobacco use:  quit    Vital Signs:  Patient Profile:   60 Years Old Female Height:     66 inches Weight:      270.4 pounds BMI:     43.80 O2 Sat:      90 % Temp:     99.2 degrees F oral Pulse rate:   85 / minute BP sitting:   140 / 70  (left arm) Cuff size:   regular  Pt. in pain?   no   Vitals Entered By: Clarise Cruz Duncan Dull) (January 13, 2007 12:11 PM) Oxygen therapy Oxygen 2 L                  Physical Exam  in general, this is an obese uncomfortable but certainly not toxic white female with minimal increased worker breathing a hacking upper airway cough that is minimally productive of thick yellow mucus while in the office. HEENT mild turbinate edema.  Oropharynx no thrush or excess pnd or cobblestoning.  No JVD or cervical adenopathy. Mild accessory muscle hypertrophy. Trachea midline, nl thryroid. Chest was hyperinflated by percussion with diminished breath sounds and moderate increased exp time with trace wheeze. Hoover sign positive at mid inspiration. Regular rate and rhythm without murmur gallop or rub or increase P2.  Abd: no hsm, nl excursion. Ext warm without C,C or E.       Impression & Recommendations:  Problem # 1:  COPD (ICD-496) I had an extended discussion with the patient today lasting 15 to 20 minutes of a 25 minute visit on the following issues:   Acute exacerbation secondary to viral URI versus TWAR bacterial infection.  I recommended that doxycycline for 7 days plus a short burst of prednisone.  I also recommended that she use Xopenex nebulizer  1.25 g now and every 4 hours to supplement her maintenance regimen if the HFA does not provide relief.  I spent extra time with the patient today explaining optimal mdi  technique.  This improved from  50 -75%   Each maintenance medication was reviewed in detail including most importantly the difference between maintenance and as needed and under what circumstances the prns are to be used.     I continue to emphasize to the patient the difference being treating an acute exacerbation, which is what she has now, versus her satisfaction with her chronic therapy, which seems to be quite a bit better on the regimen that was outlined on her calendar.  Therefore I made no changes in the calendar but went back over each and every one of these details with her, both her acute management and her chronic maintenance and p.r.n. medications.   Her updated medication list for this problem includes:    Symbicort 80-4.5 Mcg/act Aero (Budesonide-formoterol fumarate) .Marland Kitchen..Marland Kitchen Two puffs two times a day    Spiriva Handihaler 18 Mcg Caps (Tiotropium bromide monohydrate) ..... Once daily    Doxycycline Monohydrate 100 Mg Caps (Doxycycline monohydrate) ..... By mouth twice daily    Prednisone 10 Mg Tabs (Prednisone) .Marland Kitchen... 4x2, 3x2, 2x2,1x2  Orders: Est. Patient Level IV (14782)   Problem # 2:  DEPRESSION (ICD-311) She continues to display somewhat of a hopeless helpless affect attitude and is definitely at the cup had been curetted the cup have focused on the first exacerbation she has had in the last several months.  Long-term therefore it may be necessary to adjust her antidepressants. Orders: Est. Patient Level IV (95621)   Problem # 3:  ACUTE AND CHRONIC RESPIRATORY FAILURE (ICD-518.84) Her bicarb level is elevated at baseline so she has both hypercarbic and chronic hypoxemic expiratory failure.  Saturation 90% on 2 L is quite satisfactory therefore.  However, her ventilatory reserve is quite poor and so if she fails to respond to nebulizer therapy I suggested she go to the emergency room immediately.  Medications Added to Medication List This Visit: 1)  Doxycycline Monohydrate 100 Mg Caps (Doxycycline monohydrate) .... By mouth twice daily 2)  Prednisone 10 Mg Tabs (Prednisone) .... 4x2, 3x2, 2x2,1x2   Patient Instructions: 1)  see med calendar  2)  if condition worsens, use the nebulized xopenex 1.25 mg every 4  3)  hours and if not responding to this, go to er    Prescriptions: PREDNISONE 10 MG  TABS (PREDNISONE) 4x2, 3x2, 2x2,1x2  #20 x 0   Entered and Authorized by:   Nyoka Cowden MD   Signed by:   Nyoka Cowden MD on 01/13/2007   Method used:   Electron[facility] sent to ...       [facility] [address] [phone]*       [address]       [place], [place]  [address]       Ph: [phone] or [phone]       Fax: [phone]   RxID:   [id number] DOXYCYCLINE MONOHYDRATE 100 MG  CAPS (DOXYCYCLINE MONOHYDRATE) By mouth twice daily  #14 x 0   Entered and Authorized by:   Nyoka Cowden MD   Signed by:   Nyoka Cowden MD on 01/13/2007   Method used:   Electronically sent to ...       [facility]  [address] [phone]*  [address]       [place], [place]  [address]       Ph: [phone] or [phone]       Fax: [phone]   RxID:   [id number] ULTRAM 50 MG  TABS (TRAMADOL HCL) one every four hours as needed  #60 x 0   Entered and Authorized by:   Nyoka Cowden MD   Signed by:   Nyoka Cowden MD on 01/13/2007   Method used:   Electronically sent to ...       [facility] [phone]*       [address]       [place], [place]  [address]       Ph: [phone] or [phone]       Fax: [phone]   RxID:   [id number]  ]   Appended Document: Orders Update     Clinical Lists Changes  Orders: Added new Service order of Est. Patient Level IV (31517) - Signed

## 2010-02-10 NOTE — Assessment & Plan Note (Signed)
Summary: COLD IN EYES/ COLD? / FEVER ? /NWS $50   Vital Signs:  Patient Profile:   60 Years Old Female Height:     66 inches Weight:      284 pounds O2 Sat:      94 % O2 treatment:    2L Oxygen Temp:     99.3 degrees F oral Pulse rate:   88 / minute BP sitting:   156 / 98  (left arm) Cuff size:   large  Vitals Entered By: Payton Spark CMA (December 05, 2007 9:07 AM)                 Preventive Care Screening  Last Pneumovax:    Date:  09/12/2006    Next Due:  09/2011    Results:  given   Colonoscopy:    Next Due:  02/2009  Pap Smear:    Date:  03/13/2007    Results:  normal per Dr Jackelyn Knife   Mammogram:    Date:  01/12/2003    Results:  normal    PCP:  Jonny Ruiz  Chief Complaint:  cold in eyes.  History of Present Illness: not taking the hydralazine on a regular basis - BP remains elevated; no CP, worsening sob or DOE, no orthopnea, pnd or LE edema; here with some bilat eye symptoms with itching, burning, redness and discomfort for 3 days; no high fever or chills; has had some increaed headache today and some increased d/c from the eyes; hard to stay active on her home o2; has some significant allergy nasal symptoms with clear congestion  quite  a bit; re-gained some wt after lost 12 lbs with treadmill recently; now no longer has the treadmill - may consider getting another    Updated Prior Medication List: FUROSEMIDE 40 MG  TABS (FUROSEMIDE) Take 1 tablet by mouth once a day COUMADIN 7.5 MG TABS (WARFARIN SODIUM) Take asd 1 tablet by mouth once a day KLOR-CON 10 10 MEQ TBCR (POTASSIUM CHLORIDE) Take 1 tablet by mouth once a day FLUOXETINE HCL 40 MG CAPS (FLUOXETINE HCL) Take 1 tablet by mouth once a day [facility] OMEPRAZOLE 20 MG TBEC (OMEPRAZOLE) 1 tablet by mouth 30 minutes before the first meal RYTHMOL SR 325 MG CP12 (PROPAFENONE HCL) two times a day VERAMYST 27.5 MCG/SPRAY  SUSP (FLUTICASONE FUROATE) one spray two times a day  SYMBICORT 160-4.5 MCG/ACT  AERO (BUDESONIDE-FORMOTEROL FUMARATE) Two puffs twice daily EXFORGE 10-320 MG  TABS (AMLODIPINE BESYLATE-VALSARTAN) 1tab by mouth once daily SPIRIVA HANDIHALER 18 MCG  CAPS (TIOTROPIUM BROMIDE MONOHYDRATE) Inhale contents of 1 capsule once a day * OXYGEN 2L continuous GLUCOSAMINE-CHONDROITIN   CAPS (GLUCOSAMINE-CHONDROIT-VIT C-MN) Take 1 capsule by mouth once a day CALCIUM CARBONATE-VITAMIN D 600-400 MG-UNIT  TABS (CALCIUM CARBONATE-VITAMIN D) Take 1 tablet by mouth two times a day VITAMIN D 1000 UNIT TABS (CHOLECALCIFEROL) Take 1 tablet by mouth once a day FORTEO 600 MCG/2.4ML SOLN (TERIPARATIDE (RECOMBINANT)) 20 micrograms subcutaneously once daily XOPENEX 1.25 MG/3ML  NEBU (LEVALBUTEROL HCL) one every 6 hours as needed XOPENEX HFA 45 MCG/ACT  AERO (LEVALBUTEROL TARTRATE) inhale 2 puffs every 4 hours as needed TYLENOL 325 MG  TABS (ACETAMINOPHEN) per bottle TRAMADOL HCL 50 MG  TABS (TRAMADOL HCL) Take 1 tablet by mouth every 4 hours as needed ZYRTEC ALLERGY 10 MG  TABS (CETIRIZINE HCL) one tab at bedtime as needed [facility] SALINE NASAL SPRAY 0.65 %  SOLN (SALINE) 2 puffs every 4 hours as needed AFRIN NASAL SPRAY 0.05 % SOLN (OXYMETAZOLINE  HCL) use asd 5 x per day HYDRALAZINE HCL 10 MG TABS (HYDRALAZINE HCL) 1po three times a day TOBRAMYCIN SULFATE 0.3 % SOLN (TOBRAMYCIN SULFATE) 2 gtt  both eye qid for 10 days  Current Allergies (reviewed today): ! PCN  Past Medical History:    Reviewed history from 10/02/2007 and no changes required:       CHRONIC RHINITIS (ICD-472.0)       EDEMA (ICD-782.3)       OBSTRUCTIVE CHRONIC BRONCHITIS WITH EXACERBATION (ICD-491.21)       ACUTE AND CHRONIC RESPIRATORY FAILURE (ICD-518.84)       MORBID OBESITY (ICD-278.01)       DEEP VENOUS THROMBOPHLEBITIS, HX OF (ICD-V12.52)       COPD (ICD-496)       DEPRESSION (ICD-311)       OSTEOPOROSIS (ICD-733.00) last BMD 4/08 -2.4, intolerant of bisphosph.        HYPERTENSION (ICD-401.9)        Atrial fibrillation - paroxysmal       Anticoagulation therapy       Pulmonary embolism, hx of       neg stress myoview 10/07       Anxiety       glucose intolerance - on steroids       Hyperlipidemia  Past Surgical History:    Reviewed history from 07/29/2006 and no changes required:       Hysterectomy       Tonsillectomy       Skin Graft to midde R finger-1975   Family History:    Reviewed history from 07/07/2007 and no changes required:       asthma and brothers and son.         Heart disease in mother.         Throat cancer in brother.  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   Risk Factors:  PAP Smear History:     Date of Last PAP Smear:  03/13/2007    Results:  normal per Dr Jackelyn Knife   Mammogram History:     Date of Last Mammogram:  01/12/2003    Results:  normal    Review of Systems  The patient denies anorexia, fever, weight loss, vision loss, decreased hearing, hoarseness, chest pain, syncope, dyspnea on exertion, peripheral edema, prolonged cough, headaches, hemoptysis, abdominal pain, melena, hematochezia, severe indigestion/heartburn, hematuria, incontinence, muscle weakness, suspicious skin lesions, transient blindness, difficulty walking, depression, unusual weight change, abnormal bleeding, enlarged lymph nodes, angioedema, and breast masses.         all otherwise negative    Physical Exam  General:     alert and overweight-appearing.   Head:     Normocephalic and atraumatic without obvious abnormalities. No apparent alopecia or balding. Eyes:     bilat conjunct erythema and d/c Ears:     bilat tm's mild red, sinus nontender Nose:      External nasal examination shows no deformity or inflammation. Nasal mucosa are pink and moist without lesions or exudates. Mouth:     Oral mucosa and oropharynx without lesions or exudates.  Teeth in good repair. Neck:     supple and  full ROM.   Lungs:     Normal respiratory effort, chest expands symmetrically. Lungs are clear to auscultation, no crackles or wheezes. Heart:     Normal rate and regular rhythm. S1 and S2 normal without gallop, murmur, click, rub or other extra sounds. Abdomen:     Bowel sounds positive,abdomen soft and non-tender without masses, organomegaly or hernias noted. Msk:     no joint tenderness and no joint swelling.   Extremities:     no edema, no ulcers  Neurologic:     cranial nerves II-XII intact and strength normal in all extremities.      Impression & Recommendations:  Problem # 1:  Preventive Health Care (ICD-V70.0) Overall doing well, up to date, counseled on routine health concerns for screening and prevention, immunizations up to date or declined, labs ordered  - for tetanus today   Orders: T-Vitamin D (25-Hydroxy) ([address]-60454) TLB-BMP (Basic Metabolic Panel-BMET) (80048-METABOL) TLB-CBC Platelet - w/Differential (85025-CBCD) TLB-Hepatic/Liver Function Pnl (80076-HEPATIC) TLB-Lipid Panel (80061-LIPID) TLB-TSH (Thyroid Stimulating Hormone) (84443-TSH) TLB-Udip ONLY (81003-UDIP) TLB-Udip w/ Micro (81001-URINE)   Problem # 2:  HYPERTENSION (ICD-401.9)  The following medications were removed from the medication list:    Furosemide 40 Mg Tabs (Furosemide) .Marland Kitchen... 1 extra daily as needed  Her updated medication list for this problem includes:    Furosemide 40 Mg Tabs (Furosemide) .Marland Kitchen... Take 1 tablet by mouth once a day    Exforge 10-320 Mg Tabs (Amlodipine besylate-valsartan) .Marland Kitchen... 1tab by mouth once daily    Hydralazine Hcl 10 Mg Tabs (Hydralazine hcl) .Marland Kitchen... 1po three times a day  treat as above, f/u any worsening signs or symptoms - to take the hydralazine on regular basis with hard to control BP  Problem # 3:  GLUCOSE INTOLERANCE (ICD-271.3) stable recently - asympt - ok to follow  Problem # 4:  HYPERLIPIDEMIA (ICD-272.4)  Labs Reviewed: Chol: 194 (03/17/2006)   HDL: 33.9 (03/17/2006)   LDL: 130.7 (03/17/2006)   TG: 222 (03/17/2006) SGOT: 19 (10/27/2007)   SGPT: 18 (10/27/2007) stable overall by hx and exam, ok to continue meds/tx as is   Problem # 5:  CONJUNCTIVITIS (ICD-372.30) tx with tobramycin opth asd Her updated medication list for this problem includes:    Tobramycin Sulfate 0.3 % Soln (Tobramycin sulfate) .Marland Kitchen... 2 gtt  both eye qid for 10 days   Complete Medication List: 1)  Furosemide 40 Mg Tabs (Furosemide) .... Take 1 tablet by mouth once a day 2)  Coumadin 7.5 Mg Tabs (Warfarin sodium) .... Take asd 1 tablet by mouth once a day 3)  Klor-con 10 10 Meq Tbcr (Potassium chloride) .... Take 1 tablet by mouth once a day 4)  Fluoxetine Hcl 40 Mg Caps (Fluoxetine hcl) .... Take 1 tablet by mouth once a day 5)  [facility] Omeprazole 20 Mg Tbec (Omeprazole) .Marland Kitchen.. 1 tablet by mouth 30 minutes before the first meal 6)  Rythmol Sr 325 Mg Cp12 (Propafenone hcl) .... Two times a day 7)  Veramyst 27.5 Mcg/spray Susp (Fluticasone furoate) .... One spray two times a day 8)  Symbicort 160-4.5 Mcg/act Aero (Budesonide-formoterol fumarate) .... Two puffs twice daily 9)  Exforge 10-320 Mg Tabs (Amlodipine besylate-valsartan) .Marland Kitchen.. 1tab by mouth once daily 10)  Spiriva Handihaler 18 Mcg Caps (Tiotropium bromide monohydrate) .... Inhale contents of 1 capsule once a day 11)  Oxygen 2l  .... Continuous 12)  Glucosamine-chondroitin Caps (Glucosamine-chondroit-vit c-mn) .... Take 1 capsule by mouth once a day 13)  Calcium Carbonate-vitamin D 600-400 Mg-unit Tabs (  Calcium carbonate-vitamin d) .... Take 1 tablet by mouth two times a day  14)  Vitamin D 1000 Unit Tabs (Cholecalciferol) .... Take 1 tablet by mouth once a day 15)  Forteo 600 Mcg/2.53ml Soln (Teriparatide (recombinant)) .... 20 micrograms subcutaneously once daily 16)  Xopenex 1.25 Mg/26ml Nebu (Levalbuterol hcl) .... One every 6 hours as needed 17)  Xopenex Hfa 45 Mcg/act Aero (Levalbuterol tartrate) .... Inhale 2 puffs every 4 hours as needed 18)  Tylenol 325 Mg Tabs (Acetaminophen) .... Per bottle 19)  Tramadol Hcl 50 Mg Tabs (Tramadol hcl) .... Take 1 tablet by mouth every 4 hours as needed 20)  Zyrtec Allergy 10 Mg Tabs (Cetirizine hcl) .... One tab at bedtime as needed 21)  [facility] Saline Nasal Spray 0.65 % Soln (Saline) .... 2 puffs every 4 hours as needed 22)  Afrin Nasal Spray 0.05 % Soln (Oxymetazoline hcl) .... Use asd 5 x per day 23)  Hydralazine Hcl 10 Mg Tabs (Hydralazine hcl) .Marland Kitchen.. 1po three times a day 24)  Tobramycin Sulfate 0.3 % Soln (Tobramycin sulfate) .... 2 gtt  both eye qid for 10 days  Other Orders: Tdap => 48yrs IM ([address]) Admin 1st Vaccine ([address]454)   Patient Instructions: 1)  please call [facility] for the mammogram followup 2)  you received the tetanus shot today 3)  Please go to the Lab in the basement for your blood tests today 4)  start the eye drops as prescribed for 10 days 5)  start the hydralazine 10 mg three time per day for the Blood pressure 6)  Check your Blood Pressure regularly. If it is above 140/90: you should make an appointment. 7)  Please schedule a follow-up appointment in 4 months.   Prescriptions: TOBRAMYCIN SULFATE 0.3 % SOLN (TOBRAMYCIN SULFATE) 2 gtt  both eye qid for 10 days  #1 x 0   Entered and Authorized by:   Corwin Levins MD   Signed by:   Corwin Levins MD on 12/05/2007   Method used:   Electronically to        [facility] [address] [phone]* (retail)       [address]       [place], [place]  [address]       Ph: [phone] or [phone]       Fax: [phone]    RxID:   [id number] HYDRALAZINE HCL 10 MG TABS (HYDRALAZINE HCL) 1po three times a day  #90 x 3   Entered and Authorized by:   Corwin Levins MD   Signed by:   Corwin Levins MD on 12/05/2007   Method used:   Electronically to        [facility] [address] [phone]* (retail)       [address]       [place], [place]  [address]       Ph: [phone] or [phone]       Fax: [phone]   RxID:   [id number]  ]  Tetanus/Td Vaccine    Vaccine Type: Tdap    Site: right deltoid    Mfr: GlaxoSmithKline    Dose: 0.5 ml    Route: IM    Given by: Payton Spark CMA    Exp. Date: 12/15/2009    Lot #: [id number]    VIS given: 11/29/06 version given December 05, 2007.

## 2010-02-10 NOTE — Medication Information (Signed)
Summary: ccr  Anticoagulant Therapy  Managed by: Charlena Cross, RN, BSN Referring MD: Valera Castle MD PCP: Gwen Pounds MD: Shirlee Latch MD,Josefa [place] Indication 1: Pulmonary embolism (ICD-415.19) Lab Used: [facility] Site: [place] INR POC 3.6 INR RANGE 2 - 3  Dietary changes: no    Health status changes: no    Bleeding/hemorrhagic complications: no    Recent/future hospitalizations: no    Any changes in medication regimen? no    Recent/future dental: no  Any missed doses?: no       Is patient compliant with meds? yes       Allergies: 1)  ! Pcn  Anticoagulation Management History:      The patient is taking warfarin and comes in today for a routine follow up visit.  Negative risk factors for bleeding include an age less than 51 years old.  The bleeding index is 'low risk'.  Positive CHADS2 values include History of HTN.  Negative CHADS2 values include Age > 18 years old.  The start date was 06/13/2008.  Her last INR was 3.9 RATIO.  Anticoagulation responsible provider: Shirlee Latch MD,Caiden Arteaga.  INR POC: 3.6.    Anticoagulation Management Assessment/Plan:      The patient's current anticoagulation dose is Coumadin 7.5 mg tabs: Take asd 1 tablet by mouth once a day.  The target INR is 2 - 3.  The next INR is due 12/12/2008.  Anticoagulation instructions were given to patient.  Results were reviewed/authorized by Charlena Cross, RN, BSN.  She was notified by Charlena Cross, RN, BSN.         Prior Anticoagulation Instructions: none today, coumadin 7.5 mg tomorrow then resume coumadin 11.25 mg daily with 7.5mg  on Sun and Wed.  Current Anticoagulation Instructions: none today then coumadin 11.25 mg daily with 7.5 mg on Sun Wed Fri

## 2010-02-10 NOTE — Progress Notes (Signed)
  Phone Note Call from Patient   Call For: [name] [name]. Wall, MD Reason for Call: Acute Illness Action Taken: Phone Call Completed Summary of Call: [name] [name] called because she was having palpitations. Her HR on home BP machine was 120 or more. She had just taken her evening dose of Rhythmol. No CP/SOB/dizzyness but can feel palps. Advised her to call 911 and come to ER but she refused @ this time. Will wait a while and come in if no better.  Initial call taken by: [facility].

## 2010-02-10 NOTE — Progress Notes (Signed)
  Phone Note Refill Request  on March 05, 2009 3:21 PM  Refills Requested: Medication #1:  FUROSEMIDE 40 MG  TABS Take 1 tablet by mouth once a day and as needed for excessive swelling   Dosage confirmed as above?Dosage Confirmed   Notes: [facility] Initial call taken by: Scharlene Gloss,  March 05, 2009 3:21 PM    Prescriptions: COUMADIN 7.5 MG TABS (WARFARIN SODIUM) Take asd 1 tablet by mouth once a day  #100 x 3   Entered by:   Scharlene Gloss   Authorized by:   Corwin Levins MD   Signed by:   Scharlene Gloss on 03/05/2009   Method used:   Faxed to ...       [facility]* (mail-order)             ,          Ph: [phone]       Fax: [phone]   RxID:   [id number] FUROSEMIDE 40 MG  TABS (FUROSEMIDE) Take 1 tablet by mouth once a day and as needed for excessive swelling  #90 x 3   Entered by:   Scharlene Gloss   Authorized by:   Corwin Levins MD   Signed by:   Scharlene Gloss on 03/05/2009   Method used:   Faxed to ...       [facility]* (mail-order)             ,          Ph: [phone]       Fax: [phone]   RxID:   [id number]

## 2010-02-10 NOTE — Assessment & Plan Note (Signed)
Summary: cough,congestion, tightness in chest/apc   PCP:  Jonny Ruiz  Chief Complaint:  Acute visit.  Pt c/o prod cough with green sputum and sore throatwith painful swallowing onset was about 4 days ago.  She also has some increased so b and wheezing that occurs when she has coughing spells.  Pt states that she has also had fever x 24 hours..  History of Present Illness:  60 yowf last smoked 2003 with   COPD with minimum asthmatic component with an FEV1 of 55%, a ratio of 53%, and a diffusing capacity 51% documented 07/25/06.  On 02 24 hours per day.  6/09  weighed 271 and it was felt she was as debilitated as much by obesity as by airflow obstruction. baseline = when well = walk to mother in law's house but stops at least once because it's uphill and frequently requiring rescue rx.  December 18, 2007 fu ov:  no change doe, leg swelling less. not needing rescue rx or any meds from as needed list.  no cough.   January 09, 2008 sick since 01/01/08  with green sputum left post pleuritic cp and diarrhea, aching all over with nausea and sob not relieved by as needed xopenex.  --admitted  January 22, 2008 --returns post hospital , admitted 12/29-01/15/07 for CAP -LLL , tx with Avelox (tot. 10 d)  Since discharge, doing better not back to baseline. -CXR improved but small residual density.   February 07, 2008--Complains that she has sore throat, hoarseness, cough, congestion, watery eyes, DOE, wheezing over last 3 days. Since last visit was improving and returned back to baseline  but 3 days ago, cough started again. green mucus w/ nasal discharge.Denies chest pain, , orthopnea, hemoptysis,  n/v/d, edema      Updated Prior Medication List: FUROSEMIDE 40 MG  TABS (FUROSEMIDE) Take 1 tablet by mouth once a day COUMADIN 7.5 MG TABS (WARFARIN SODIUM) Take asd 1 tablet by mouth once a day KLOR-CON 10 10 MEQ TBCR (POTASSIUM CHLORIDE) Take 2 tablets by mouth once a day  FLUOXETINE HCL 40 MG CAPS (FLUOXETINE HCL) Take 1 tablet by mouth once a day [facility] OMEPRAZOLE 20 MG TBEC (OMEPRAZOLE) 1 tablet by mouth 30 minutes before the first meal RYTHMOL SR 325 MG CP12 (PROPAFENONE HCL) two times a day VERAMYST 27.5 MCG/SPRAY  SUSP (FLUTICASONE FUROATE) one spray two times a day SYMBICORT 160-4.5 MCG/ACT  AERO (BUDESONIDE-FORMOTEROL FUMARATE) Two puffs twice daily EXFORGE 10-320 MG  TABS (AMLODIPINE BESYLATE-VALSARTAN) 1tab by mouth once daily SPIRIVA HANDIHALER 18 MCG  CAPS (TIOTROPIUM BROMIDE MONOHYDRATE) Inhale contents of 1 capsule once a day * OXYGEN 2L continuous GLUCOSAMINE-CHONDROITIN   CAPS (GLUCOSAMINE-CHONDROIT-VIT C-MN) Take 1 capsule by mouth once a day CALCIUM CARBONATE-VITAMIN D 600-400 MG-UNIT  TABS (CALCIUM CARBONATE-VITAMIN D) Take 1 tablet by mouth two times a day VITAMIN D 1000 UNIT TABS (CHOLECALCIFEROL) Take 1 tablet by mouth once a day FORTEO 600 MCG/2.4ML SOLN (TERIPARATIDE (RECOMBINANT)) 20 micrograms subcutaneously once daily XOPENEX 1.25 MG/3ML  NEBU (LEVALBUTEROL HCL) one every 6 hours as needed XOPENEX HFA 45 MCG/ACT  AERO (LEVALBUTEROL TARTRATE) inhale 2 puffs every 4 hours as needed TYLENOL 325 MG  TABS (ACETAMINOPHEN) per bottle TRAMADOL HCL 50 MG  TABS (TRAMADOL HCL) Take 1 tablet by mouth every 4 hours as needed ZYRTEC ALLERGY 10 MG  TABS (CETIRIZINE HCL) one tab at bedtime as needed [facility] SALINE NASAL SPRAY 0.65 %  SOLN (SALINE) 2 puffs every 4 hours as needed AFRIN NASAL SPRAY 0.05 %  SOLN (OXYMETAZOLINE HCL) use asd 5 x per day HYDRALAZINE HCL 10 MG TABS (HYDRALAZINE HCL) 1po three times a day LEVAQUIN 750 MG TABS (LEVOFLOXACIN) 1 by mouth once daily  Current Allergies (reviewed today): ! PCN  Past Medical History:    Reviewed history from 12/18/2007 and no changes required:       CHRONIC RHINITIS (ICD-472.0)       EDEMA (ICD-782.3)       ACUTE AND CHRONIC RESPIRATORY FAILURE (ICD-518.84)       MORBID OBESITY (ICD-278.01)            - Target wt  =   179  for BMI < 30            - Referred back to nutrition December 18, 2007        DEEP VENOUS THROMBOPHLEBITIS, HX OF (ICD-V12.52)       COPD (ICD-496)           - Pft's 07/25/06 FEV1 55% ratio 53, DLC0 51%       DEPRESSION (ICD-311)       OSTEOPOROSIS (ICD-733.00) last BMD 4/08 -2.4, intolerant of bisphosphonates        HYPERTENSION (ICD-401.9)       Atrial fibrillation - paroxysmal       Anticoagulation therapy       Pulmonary embolism, hx of       neg stress myoview 10/07       Anxiety       glucose intolerance - on steroids       Hyperlipidemia   Family History:    Reviewed history from 07/07/2007 and no changes required:       asthma and brothers and son.         Heart disease in mother.         Throat cancer in brother.  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   Risk Factors:  Tobacco use:  quit    Year quit:  1998    Pack-years:  51yrs, 1ppd Alcohol use:  no  Colonoscopy History:    Date of Last Colonoscopy:  02/13/2004  Mammogram History:    Date of Last Mammogram:  01/12/2003  PAP Smear History:    Date of Last PAP Smear:  03/13/2007   Review of Systems      See HPI   Vital Signs:  Patient Profile:   60 Years Old Female Height:     66 inches Weight:      280 pounds O2 Sat:      98 % O2 treatment:    2 liters pulsed Temp:     97.2 degrees F oral Pulse rate:   50 / minute BP sitting:   112 / 68  (left arm) Cuff size:   large  Vitals Entered By: Vernie Murders (February 07, 2008 9:45 AM)             Is Patient Diabetic? No     Physical Exam  Ambulatory healthy appearing in no acute distress. Afeb with normal vital signs    283 January 09, 2008 >.279 January 22, 2008 >>280 February 07, 2008  HEENT: nl dentition, turbinates, and orophanx. Nl external ear canals without cough reflex Neck without JVD/Nodes/TM Lungs coarse BS w/ no wheezing RRR no s3 or murmur or increase in P2, few ectopic beats noted Abd soft and benign with nl excursion in the supine position. No bruits or organomegaly Ext warm without calf tenderness, cyanosis clubbing or edema Skin warm and dry without lesions        Impression & Recommendations:  Problem # 1:  PNEUMONIA ORGANISM NOS (ICD-486) Previous PNA , last cxr with residual density, follow up cxr today to document clearance.  Her updated medication list for this problem includes:    Levaquin 750 Mg Tabs (Levofloxacin) .Marland Kitchen... 1 by mouth once daily  Orders: T-2 View CXR, Same Day (71020.5TC) Est. Patient Level IV (53664)   Problem # 2:  OBSTRUCTIVE CHRONIC BRONCHITIS WITH EXACERBATION (ICD-491.21) Recurrent exacerbation REC: Levaquin 750mg  once daily for 5 days follow med calendar for cough and nasal symptom control.  Increase fluids.  Please contact office for sooner follow up if symptoms do not improve or worsen follow up Dr. Sherene Sires in 2 weeks.  Orders: T-2 View CXR, Same Day (71020.5TC) Est. Patient Level IV (40347)   Medications Added to Medication List This Visit: 1)  Klor-con 10 10 Meq Tbcr (Potassium chloride) .... Take 2 tablets by mouth once a day 2)  Levaquin 750 Mg Tabs (Levofloxacin) .Marland Kitchen.. 1 by mouth once daily  Complete Medication List: 1)  Furosemide 40 Mg Tabs (Furosemide) .... Take 1 tablet by mouth once a day 2)  Coumadin 7.5 Mg Tabs (Warfarin sodium) .... Take asd 1 tablet by mouth once a day 3)  Klor-con 10 10 Meq Tbcr (Potassium chloride) .... Take 2 tablets by mouth once a day 4)  Fluoxetine Hcl 40 Mg Caps (Fluoxetine hcl) .... Take 1 tablet by mouth once a day 5)  [facility] Omeprazole 20 Mg Tbec (Omeprazole) .Marland Kitchen.. 1 tablet by mouth 30 minutes before the first meal  6)  Rythmol Sr 325 Mg Cp12 (Propafenone hcl) .... Two times a day 7)  Veramyst 27.5 Mcg/spray Susp (Fluticasone furoate) .... One spray two times a day 8)  Symbicort 160-4.5 Mcg/act Aero (Budesonide-formoterol fumarate) .... Two puffs twice daily 9)  Exforge 10-320 Mg Tabs (Amlodipine besylate-valsartan) .Marland Kitchen.. 1tab by mouth once daily 10)  Spiriva Handihaler 18 Mcg Caps (Tiotropium bromide monohydrate) .... Inhale contents of 1 capsule once a day 11)  Oxygen 2l  .... Continuous 12)  Glucosamine-chondroitin Caps (Glucosamine-chondroit-vit c-mn) .... Take 1 capsule by mouth once a day 13)  Calcium Carbonate-vitamin D 600-400 Mg-unit Tabs (Calcium carbonate-vitamin d) .... Take 1 tablet by mouth two times a day 14)  Vitamin D 1000 Unit Tabs (Cholecalciferol) .... Take 1 tablet by mouth once a day 15)  Forteo 600 Mcg/2.81ml Soln (Teriparatide (recombinant)) .... 20 micrograms subcutaneously once daily 16)  Xopenex 1.25 Mg/8ml Nebu (Levalbuterol hcl) .... One every 6 hours as needed 17)  Xopenex Hfa 45 Mcg/act Aero (Levalbuterol tartrate) .... Inhale 2 puffs every 4 hours as needed 18)  Tylenol 325 Mg Tabs (Acetaminophen) .... Per bottle 19)  Tramadol Hcl 50 Mg Tabs (Tramadol hcl) .... Take 1 tablet by mouth every 4 hours as needed 20)  Zyrtec Allergy 10 Mg Tabs (Cetirizine hcl) .... One tab at bedtime as needed 21)  [facility] Saline Nasal Spray 0.65 % Soln (Saline) .... 2 puffs every 4 hours as needed 22)  Afrin Nasal Spray 0.05 % Soln (Oxymetazoline hcl) .... Use asd 5 x per  day 23)  Hydralazine Hcl 10 Mg Tabs (Hydralazine hcl) .Marland Kitchen.. 1po three times a day 24)  Levaquin 750 Mg Tabs (Levofloxacin) .Marland Kitchen.. 1 by mouth once daily   Patient Instructions: 1)  Levaquin 750mg  once daily for 5 days 2)  follow med calendar for cough and nasal symptom control.  3)  Increase fluids.  4)  Please contact office for sooner follow up if symptoms do not improve or worsen 5)  follow up Dr. Sherene Sires in 2 weeks.     Prescriptions: LEVAQUIN 750 MG TABS (LEVOFLOXACIN) 1 by mouth once daily  #5 x 0   Entered and Authorized by:   Rubye Oaks NP   Signed by:   Rubye Oaks NP on 02/07/2008   Method used:   [facility] to        [phone]* (retail)       [address]       [place], [place]  [address]       Ph: [phone] or [phone]       Fax: [phone]   RxID:   [id number]

## 2010-02-10 NOTE — Medication Information (Signed)
Summary: Coumadin Clinic  Anticoagulant Therapy  Managed by: Cloyde Reams, RN, BSN Referring MD: Valera Castle MD PCP: Corwin Levins MD Supervising MD: Mariah Milling Indication 1: Pulmonary embolism (ICD-415.19) Lab Used: [facility] [place] Site: [place] [facility] 24.7 INR POC 2.25 INR RANGE 2 - 3   Health status changes: yes       Details: Pt fell, broke rib.  Bleeding/hemorrhagic complications: no    Recent/future hospitalizations: yes       Details: Went to ED on 06/10/09, started on abx, unsure on name WCB with name of abx.  Any changes in medication regimen? yes       Details: new abx.  Recent/future dental: no  Any missed doses?: no       Is patient compliant with meds? yes       Allergies: 1)  ! Pcn  Anticoagulation Management History:      Her anticoagulation is being managed by telephone today.  Negative risk factors for bleeding include an age less than 62 years old.  The bleeding index is 'low risk'.  Positive CHADS2 values include History of HTN.  Negative CHADS2 values include Age > 84 years old.  The start date was 06/13/2008.  Her last INR was 5.6.  Prothrombin time is 24.7.  Anticoagulation responsible provider: Ziasia Lenoir.  INR POC: 2.25.  Exp: 05/2010.    Anticoagulation Management Assessment/Plan:      The patient's current anticoagulation dose is Coumadin 7.5 mg tabs: Take asd 1 tablet by mouth once a day.  The target INR is 2 - 3.  The next INR is due 07/09/2009.  Anticoagulation instructions were given to patient.  Results were reviewed/authorized by Cloyde Reams, RN, BSN.  She was notified by Cloyde Reams RN.         Prior Anticoagulation Instructions: INR 2.6  Continue on same dosage 11.25mg  daily except 7.5mg  on Sundays, Wednesdays, and Fridays.  Recheck in 4 weeks.    Current Anticoagulation Instructions: INR 2.25  Called spoke with pt.  Advised to continue on same dosage 11.25mg  daily except 7.5mg  on Sundays, Wednesdays, and  Fridays.  Awaiting pt call back with name of abx given in ER.

## 2010-02-10 NOTE — Progress Notes (Signed)
Summary: surgery this week/ meds to stop taking beforehand  Phone Note Call from Patient   Caller: Patient Call For: wert Summary of Call: pt states that she broke her wrist last week. will have surgery (by dr Ronalee Belts) later this week. needs to talk to nurse re: stopping some meds before then. pt takes coumidin. says that dr Izora Ribas was going to call (he may have called already and spoke to [facility] re: this).  Initial call taken by: Tivis Ringer,  February 19, 2008 10:34 AM  Follow-up for Phone Call        Pt says she will be having hand surgery, possibly at the end of the week. She wants to know if Dr. Izora Ribas has spoke with you regarding this matter. He was to call you and discuss. She was told to stop her Coumadin and I told her she needed to speak with the coumadin clinic and Dr. Daleen Squibb about this because they are the ones dosing the Coumadin. Follow-up by: Michel Bickers CMA,  February 19, 2008 11:56 AM  Additional Follow-up for Phone Call Additional follow up Details #1::        fine with me to stop coumadin for surgery Additional Follow-up by: Nyoka Cowden MD,  February 19, 2008 3:11 PM    Additional Follow-up for Phone Call Additional follow up Details #2::    spoke with patient, and informed her of MW's okay to stop the coumadin for surgery. Follow-up by: Boone Master CNA,  February 19, 2008 3:28 PM

## 2010-02-10 NOTE — Medication Information (Signed)
Summary: CCR/AMD  Anticoagulant Therapy  Managed by: Charlena Cross, RN, BSN Referring MD: Valera Castle MD PCP: Gwen Pounds MD: Ladona Ridgel MD, Sharlot Gowda Indication 1: Pulmonary embolism (ICD-415.19) Lab Used: [facility] [place] Site: [place] INR POC 1.4 INR RANGE 2 - 3  Dietary changes: no    Health status changes: no    Bleeding/hemorrhagic complications: yes       Details: nosebleeds, bruise on back of thigh  Recent/future hospitalizations: no    Any changes in medication regimen? no    Recent/future dental: no  Any missed doses?: no       Is patient compliant with meds? yes       Allergies: 1)  ! Pcn  Anticoagulation Management History:      The patient is taking warfarin and comes in today for a routine follow up visit.  Negative risk factors for bleeding include an age less than 60 years old.  The bleeding index is 'low risk'.  Positive CHADS2 values include History of HTN.  Negative CHADS2 values include Age > 60 years old.  The start date was 06/13/2008.  Her last INR was 5.6.  Anticoagulation responsible provider: Ladona Ridgel MD, Sharlot Gowda.  INR POC: 1.4.    Anticoagulation Management Assessment/Plan:      The patient's current anticoagulation dose is Coumadin 7.5 mg tabs: Take asd 1 tablet by mouth once a day.  The target INR is 2 - 3.  The next INR is due 03/03/2009.  Anticoagulation instructions were given to patient.  Results were reviewed/authorized by Charlena Cross, RN, BSN.  She was notified by Charlena Cross, RN, BSN.         Prior Anticoagulation Instructions: no coumadin today or tomorrow then resume dose of coumadin 11.25mg  daily with 7.5 mg on Sun Wed Fri  Current Anticoagulation Instructions: coumadin 12.5 on Wed.  then resume coumadin 11.25 mg daily with 7.5 on Sun Wed Fri

## 2010-02-10 NOTE — Progress Notes (Signed)
Summary: med question  Phone Note From Pharmacy   Caller: [facility]  Summary of Call: per [facility] states the rx for COUMADIN 7.5 MG TABS (WARFARIN SODIUM) Take 1 tablet by mouth once a day, 11mg  on Mondays  #100 x 3 is  not correct  pls  call [phone] ref [id number] pt could not possibly be on 11 mg  a day need a new rx  per [facility] Initial call taken by: Shelbie Proctor,  July 19, 2007 11:55 AM  Follow-up for Phone Call        Please advise on 11mg  monday dosage. Thanks Follow-up by: Rock Nephew CMA,  July 19, 2007 1:05 PM  Additional Follow-up for Phone Call Additional follow up Details #1::        appears to be an error on routine refill somehow - needs coumadin 7.5 mg tab - use asd 1 by mouth once daily,  #100 x 3 ref - ---Alvy Beal to do written rx for [facility] - dr Everardo All to sigh please Additional Follow-up by: Corwin Levins MD,  July 19, 2007 1:11 PM    Additional Follow-up for Phone Call Additional follow up Details #2::    Updated rx for coumadin faxed to [facility] Follow-up by: Rock Nephew CMA,  July 19, 2007 1:52 PM  New/Updated Medications: COUMADIN 7.5 MG TABS (WARFARIN SODIUM) Take asd 1 tablet by mouth once a day   Prescriptions: COUMADIN 7.5 MG TABS (WARFARIN SODIUM) Take asd 1 tablet by mouth once a day  #100 x 3   Entered by:   Rock Nephew CMA   Authorized by:   Corwin Levins MD   Signed by:   Rock Nephew CMA on 07/19/2007   Method used:   Faxed to ...       [facility]             , [place]         Ph:        Fax: [phone]   RxID:   [id number]

## 2010-02-10 NOTE — Assessment & Plan Note (Signed)
Summary: ROV/AMD   Visit Type:  Follow-up Primary Provider:  Jonny Ruiz  CC:  CHEST PAIN/ALMOST PASSED OUT ON VACATION WHILE RIDING IN CAR/SWELLING IN FEET-DIET RELATED.  History of Present Illness: [name] [name] comes in today with chief complaint of presyncope while riding in a car on vacation. She had no palpitations, chest discomfort, nausea vomiting, or diaphoresis.  She has some atypical chest pain which comes and goes, unrelated to activity.  She is chronically ill with multiple medical problems. She is O2 dependent. She is compliant with her medications which are numerous.  Her EKG shows multiple PVCs which are unifocal.  Current Medications (verified): 1)  Furosemide 40 Mg  Tabs (Furosemide) .... Take 1 Tablet By Mouth Once A Day and As Needed For Excessive Swelling 2)  Coumadin 7.5 Mg Tabs (Warfarin Sodium) .... Take Asd 1 Tablet By Mouth Once A Day 3)  Klor-Con 10 10 Meq Tbcr (Potassium Chloride) .... Take 2 Tabs Every Am and 1 Tab At Bedtime 4)  Fluoxetine Hcl 40 Mg Caps (Fluoxetine Hcl) .... Take 1 Tablet By Mouth Once A Day 5)  Omeprazole 20 Mg Cpdr (Omeprazole) .Marland Kitchen.. 1 By Mouth 30 Min Before 1st Meal 6)  Rythmol Sr 325 Mg Cp12 (Propafenone Hcl) .... Two Times A Day 7)  Veramyst 27.5 Mcg/spray  Susp (Fluticasone Furoate) .... One Spray Two Times A Day 8)  Symbicort 160-4.5 Mcg/act  Aero (Budesonide-Formoterol Fumarate) .... Two Puffs Twice Daily 9)  Exforge 10-320 Mg  Tabs (Amlodipine Besylate-Valsartan) .Marland Kitchen.. 1tab By Mouth Once Daily 10)  Spiriva Handihaler 18 Mcg  Caps (Tiotropium Bromide Monohydrate) .... Inhale Contents of 1 Capsule Once A Day 11)  Oxygen 2l Liquid .... Continuous 12)  Glucosamine-Chondroitin   Caps (Glucosamine-Chondroit-Vit C-Mn) .... Take 1 Capsule By Mouth Once A Day 13)  Calcium Carbonate-Vitamin D 600-400 Mg-Unit  Tabs (Calcium Carbonate-Vitamin D) .... Take 1 Tablet By Mouth Two Times A Day  14)  Vitamin D 1000 Unit Tabs (Cholecalciferol) .... Take 1 Tablet By Mouth Once A Day 15)  Forteo 600 Mcg/2.23ml Soln (Teriparatide (Recombinant)) .... 20 Micrograms Subcutaneously Once Daily 16)  Xopenex 1.25 Mg/66ml  Nebu (Levalbuterol Hcl) .... One Every 4 Hours As Needed 17)  Xopenex Hfa 45 Mcg/act  Aero (Levalbuterol Tartrate) .... Inhale 2 Puffs Every 4 Hours As Needed 18)  Tylenol Arthritis Pain 650 Mg Cr-Tabs (Acetaminophen) .... Per Bottle 19)  Mucinex Dm 30-600 Mg Xr12h-Tab (Dextromethorphan-Guaifenesin) .Marland Kitchen.. 1 Every 12 Hours As Needed 20)  Tramadol Hcl 50 Mg  Tabs (Tramadol Hcl) .... Take 1-2 Tablet By Mouth Every 4 Hours As  Needed For Cough 21)  Fexofenadine Hcl 180 Mg Tabs (Fexofenadine Hcl) .... Take 1 Tablet By Mouth Once A Day 22)  Cvs Saline Nasal Spray 0.65 %  Soln (Saline) .... 2 Puffs Every 4 Hours As Needed 23)  Afrin Nasal Spray 0.05 % Soln (Oxymetazoline Hcl) .Marland Kitchen.. 1 Puff Two Times A Day X5days 24)  Tessalon Perles 100 Mg Caps (Benzonatate) .Marland Kitchen.. 1 - 2 By Mouth Three Times A Day As Needed  Allergies (verified): 1)  ! Pcn  Past History:  Past Medical History: Last updated: 06/20/2008 CHRONIC RHINITIS (ICD-472.0) EDEMA (ICD-782.3) ACUTE AND CHRONIC RESPIRATORY FAILURE (ICD-518.84) MORBID OBESITY (ICD-278.01)     - Target wt  =   179  for BMI < 30 peak wt 282     - Referred back to nutrition December 18, 2007  DEEP VENOUS THROMBOPHLEBITIS, HX OF (ICD-V12.52) COPD (ICD-496)     -  Pft's 07/25/06 FEV1 55% ratio 53, DLC0 51% DEPRESSION (ICD-311) OSTEOPOROSIS (ICD-733.00) last BMD 4/08 -2.4, intolerant of bisphosphonates  HYPERTENSION (ICD-401.9) Atrial fibrillation - paroxysmal Anticoagulation therapy Pulmonary embolism, hx of neg stress myoview 10/07 Anxiety glucose intolerance - on steroids Hyperlipidemia Complex medical regimen    - Med calendar done 05/01/2008  Past Surgical History: Last updated: 04/15/2008 Hysterectomy Tonsillectomy  Skin Graft to midde R finger-1975 s/p left arm fracture with fall off chair  Family History: Last updated: 07/07/2007 asthma and brothers and son.   Heart disease in mother.   Throat cancer in brother.  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   Head:  normocephalic and atraumatic Eyes:  PERRLA/EOM intact; conjunctiva and lids normal. Neck:  Neck supple, no JVD. No masses, thyromegaly or abnormal cervical nodes. Lungs:  decreased breath sounds throughout, no rub or adventitial sounds Heart:  irregular rate and rhythm consistent with ventricular ectopy Msk:  decreased ROM.   Pulses:  pulses normal in all 4 extremities Extremities:  trace left pedal edema and trace right pedal edema.   Neurologic:  Alert and oriented x 3. Skin:  Intact without lesions or rashes. Psych:  Normal affect.   EKG  Procedure date:  10/23/2008  Findings:      normal sinus rhythm, normal PR interval, frequent PVCs which are unifocal.  Impression & Recommendations:  Problem # 1:  ATRIAL FIBRILLATION (ICD-427.31) Assessment Improved  Her updated medication list for this problem includes:    Coumadin 7.5 Mg Tabs (Warfarin sodium) .Marland Kitchen... Take asd 1 tablet by mouth once a day    Rythmol Sr 325 Mg Cp12 (Propafenone hcl) .Marland Kitchen..Marland Kitchen Two times a day  Problem # 2:  ANTICOAGULATION THERAPY (ICD-V58.61) Assessment: Unchanged  Problem # 3:  HYPERTENSION (ICD-401.9) Assessment: Improved   Her updated medication list for this problem includes:    Furosemide 40 Mg Tabs (Furosemide) .Marland Kitchen... Take 1 tablet by mouth once a day and as needed for excessive swelling    Exforge 10-320 Mg Tabs (Amlodipine besylate-valsartan) .Marland Kitchen... 1tab by mouth once daily  Problem # 4:  PREMATURE VENTRICULAR CONTRACTIONS (ICD-427.69)  Her updated medication list for this problem includes:    Coumadin 7.5 Mg Tabs (Warfarin sodium) .Marland Kitchen... Take asd 1 tablet by mouth once a day    Rythmol Sr 325 Mg Cp12 (Propafenone hcl) .Marland Kitchen..Marland Kitchen Two times a day    Exforge 10-320 Mg Tabs (Amlodipine besylate-valsartan) .Marland Kitchen... 1tab by mouth once daily These are frequent and could be the cause of her dizzy spells and presyncope. We'll obtain electrolytes including a magnesium level and a Holter monitor. We will not change any other medications.  Orders: T-Basic Metabolic Panel [id number]) T-Magnesium [id number]) Holter Monitor (Holter Monitor)  Other Orders: EKG w/ Interpretation (93000)  Patient Instructions: 1)  Your physician recommends that you schedule a follow-up appointment in: 6 months 2)  Your physician has recommended that you wear a holter monitor.  Holter monitors are medical devices that record the heart's electrical activity. Doctors most often use these monitors to diagnose arrhythmias. Arrhythmias are problems with the speed or rhythm of the heartbeat. The monitor is a small, portable device. You can wear one while you do your normal daily activities. This is usually used to diagnose what is causing palpitations/syncope (passing out). 24 hour monitor.  we will call when available.

## 2010-02-10 NOTE — Medication Information (Signed)
Summary: CCR/AMD  Anticoagulant Therapy  Managed by: Cloyde Reams, RN, BSN Referring MD: Valera Castle MD PCP: Corwin Levins MD Supervising MD: Mariah Milling Indication 1: Pulmonary embolism (ICD-415.19) Lab Used: [facility] Site: [place] INR POC 2.6 INR RANGE 2 - 3  Dietary changes: no    Health status changes: no    Bleeding/hemorrhagic complications: no    Recent/future hospitalizations: no    Any changes in medication regimen? no    Recent/future dental: no  Any missed doses?: no       Is patient compliant with meds? yes       Allergies (verified): 1)  ! Pcn  Anticoagulation Management History:      The patient is taking warfarin and comes in today for a routine follow up visit.  Negative risk factors for bleeding include an age less than 68 years old.  The bleeding index is 'low risk'.  Positive CHADS2 values include History of HTN.  Negative CHADS2 values include Age > 63 years old.  The start date was 06/13/2008.  Her last INR was 5.6.  Anticoagulation responsible provider: Ian Castagna.  INR POC: 2.6.  Cuvette Lot#: [id number].  Exp: 05/2010.    Anticoagulation Management Assessment/Plan:      The patient's current anticoagulation dose is Coumadin 7.5 mg tabs: Take asd 1 tablet by mouth once a day.  The target INR is 2 - 3.  The next INR is due 06/09/2009.  Anticoagulation instructions were given to patient.  Results were reviewed/authorized by Cloyde Reams, RN, BSN.  She was notified by Cloyde Reams RN.         Prior Anticoagulation Instructions: no coumadin today then coumadin 11.25 mg daily with 7.5 mg on S Wed Fri  Current Anticoagulation Instructions: INR 2.6  Continue on same dosage 11.25mg  daily except 7.5mg  on Sundays, Wednesdays, and Fridays.  Recheck in 4 weeks.

## 2010-02-10 NOTE — Assessment & Plan Note (Signed)
Summary: 6 mth fu   $50   stc   Vital Signs:  Patient profile:   60 year old female Height:      64 inches Weight:      255.25 pounds BMI:     43.97 O2 Sat:      96 % Temp:     97.6 degrees F oral Pulse rate:   71 / minute BP sitting:   130 / 74  (left arm) Cuff size:   large  CC: 6 month ROV   Primary Care Provider:  Jonny Ruiz  CC:  6 month ROV.  History of Present Illness: ears better after recent antibx;  and sinus no longer painful; Pt denies CP, sob, doe, wheezing, orthopnea, pnd, worsening LE edema, palps, dizziness or syncope   Pt denies new neuro symptoms such as headache, facial or extremity weakness   Problems Prior to Update: 1)  Sinusitis- Acute-nos  (ICD-461.9) 2)  Uti  (ICD-599.0) 3)  Dysuria  (ICD-788.1) 4)  Atrial Flutter  (ICD-427.32) 5)  Cough  (ICD-786.2) 6)  Ankle Pain  (ICD-719.47) 7)  Dependent Edema, Right Leg  (ICD-782.3) 8)  Pneumonia Organism Nos  (ICD-486) 9)  Preventive Health Care  (ICD-V70.0) 10)  Conjunctivitis  (ICD-372.30) 11)  Vitamin D Deficiency  (ICD-268.9) 12)  Preventive Health Care  (ICD-V70.0) 13)  Hyperlipidemia  (ICD-272.4) 14)  Glucose Intolerance  (ICD-271.3) 15)  Anxiety  (ICD-300.00) 16)  Pulmonary Embolism, Hx of  (ICD-V12.51) 17)  Anticoagulation Therapy  (ICD-V58.61) 18)  Atrial Fibrillation  (ICD-427.31) 19)  Chronic Rhinitis  (ICD-472.0) 20)  Edema  (ICD-782.3) 21)  Obstructive Chronic Bronchitis With Exacerbation  (ICD-491.21) 22)  Acute and Chronic Respiratory Failure  (ICD-518.84) 23)  Morbid Obesity  (ICD-278.01) 24)  Deep Venous Thrombophlebitis, Hx of  (ICD-V12.52) 25)  COPD  (ICD-496) 26)  Depression  (ICD-311) 27)  Osteoporosis  (ICD-733.00) 28)  Hypertension  (ICD-401.9)  Medications Prior to Update: 1)  Furosemide 40 Mg  Tabs (Furosemide) .... Take 1 Tablet By Mouth Once A Day and As Needed For Excessive Swelling 2)  Coumadin 7.5 Mg Tabs (Warfarin Sodium) .... Take Asd 1 Tablet By Mouth Once A Day  3)  Klor-Con 10 10 Meq Tbcr (Potassium Chloride) .... Take 2 Tabs Every Am and 1 Tab At Bedtime 4)  Fluoxetine Hcl 40 Mg Caps (Fluoxetine Hcl) .... Take 1 Tablet By Mouth Once A Day 5)  Omeprazole 20 Mg Cpdr (Omeprazole) .Marland Kitchen.. 1 By Mouth 30 Min Before 1st Meal 6)  Rythmol Sr 325 Mg Cp12 (Propafenone Hcl) .... Two Times A Day 7)  Veramyst 27.5 Mcg/spray  Susp (Fluticasone Furoate) .... One Spray Two Times A Day 8)  Symbicort 160-4.5 Mcg/act  Aero (Budesonide-Formoterol Fumarate) .... Two Puffs Twice Daily 9)  Exforge 10-320 Mg  Tabs (Amlodipine Besylate-Valsartan) .Marland Kitchen.. 1tab By Mouth Once Daily 10)  Spiriva Handihaler 18 Mcg  Caps (Tiotropium Bromide Monohydrate) .... Inhale Contents of 1 Capsule Once A Day 11)  Oxygen 2l Liquid .... Continuous 12)  Glucosamine-Chondroitin   Caps (Glucosamine-Chondroit-Vit C-Mn) .... Take 1 Capsule By Mouth Once A Day 13)  Calcium Carbonate-Vitamin D 600-400 Mg-Unit  Tabs (Calcium Carbonate-Vitamin D) .... Take 1 Tablet By Mouth Two Times A Day 14)  Vitamin D 1000 Unit Tabs (Cholecalciferol) .... Take 1 Tablet By Mouth Once A Day 15)  Forteo 600 Mcg/2.19ml Soln (Teriparatide (Recombinant)) .... 20 Micrograms Subcutaneously Once Daily 16)  Xopenex 1.25 Mg/29ml  Nebu (Levalbuterol Hcl) .... One Every 4 Hours As  Needed 17)  Xopenex Hfa 45 Mcg/act  Aero (Levalbuterol Tartrate) .... Inhale 2 Puffs Every 4 Hours As Needed 18)  Tylenol Arthritis Pain 650 Mg Cr-Tabs (Acetaminophen) .... Per Bottle 19)  Mucinex Dm 30-600 Mg Xr12h-Tab (Dextromethorphan-Guaifenesin) .Marland Kitchen.. 1 Every 12 Hours As Needed 20)  Tramadol Hcl 50 Mg  Tabs (Tramadol Hcl) .... Take 1-2 Tablet By Mouth Every 4 Hours As  Needed For Cough 21)  Fexofenadine Hcl 180 Mg Tabs (Fexofenadine Hcl) .... Take 1 Tablet By Mouth Once A Day 22)  Cvs Saline Nasal Spray 0.65 %  Soln (Saline) .... 2 Puffs Every 4 Hours As Needed 23)  Afrin Nasal Spray 0.05 % Soln (Oxymetazoline Hcl) .Marland Kitchen.. 1 Puff Two Times A Day X5days  24)  Tessalon Perles 100 Mg Caps (Benzonatate) .Marland Kitchen.. 1 - 2 By Mouth Three Times A Day As Needed 25)  Levaquin 500 Mg Tabs (Levofloxacin) .... Once Daily  Current Medications (verified): 1)  Furosemide 40 Mg  Tabs (Furosemide) .... Take 1 Tablet By Mouth Once A Day and As Needed For Excessive Swelling 2)  Coumadin 7.5 Mg Tabs (Warfarin Sodium) .... Take Asd 1 Tablet By Mouth Once A Day 3)  Klor-Con 10 10 Meq Tbcr (Potassium Chloride) .... Take 2 Tabs Every Am and 1 Tab At Bedtime 4)  Fluoxetine Hcl 40 Mg Caps (Fluoxetine Hcl) .... Take 1 Tablet By Mouth Once A Day 5)  Omeprazole 20 Mg Cpdr (Omeprazole) .Marland Kitchen.. 1 By Mouth 30 Min Before 1st Meal 6)  Rythmol Sr 325 Mg Cp12 (Propafenone Hcl) .... Two Times A Day 7)  Veramyst 27.5 Mcg/spray  Susp (Fluticasone Furoate) .... One Spray Two Times A Day 8)  Symbicort 160-4.5 Mcg/act  Aero (Budesonide-Formoterol Fumarate) .... Two Puffs Twice Daily 9)  Exforge 10-320 Mg  Tabs (Amlodipine Besylate-Valsartan) .Marland Kitchen.. 1tab By Mouth Once Daily 10)  Spiriva Handihaler 18 Mcg  Caps (Tiotropium Bromide Monohydrate) .... Inhale Contents of 1 Capsule Once A Day 11)  Oxygen 2l Liquid .... Continuous 12)  Glucosamine-Chondroitin   Caps (Glucosamine-Chondroit-Vit C-Mn) .... Take 1 Capsule By Mouth Once A Day 13)  Calcium Carbonate-Vitamin D 600-400 Mg-Unit  Tabs (Calcium Carbonate-Vitamin D) .... Take 1 Tablet By Mouth Two Times A Day 14)  Vitamin D 1000 Unit Tabs (Cholecalciferol) .... Take 1 Tablet By Mouth Once A Day 15)  Forteo 600 Mcg/2.52ml Soln (Teriparatide (Recombinant)) .... 20 Micrograms Subcutaneously Once Daily 16)  Xopenex 1.25 Mg/44ml  Nebu (Levalbuterol Hcl) .... One Every 4 Hours As Needed 17)  Xopenex Hfa 45 Mcg/act  Aero (Levalbuterol Tartrate) .... Inhale 2 Puffs Every 4 Hours As Needed 18)  Tylenol Arthritis Pain 650 Mg Cr-Tabs (Acetaminophen) .... Per Bottle  19)  Mucinex Dm 30-600 Mg Xr12h-Tab (Dextromethorphan-Guaifenesin) .Marland Kitchen.. 1 Every 12 Hours As Needed 20)  Tramadol Hcl 50 Mg  Tabs (Tramadol Hcl) .... Take 1-2 Tablet By Mouth Every 4 Hours As  Needed For Cough 21)  Fexofenadine Hcl 180 Mg Tabs (Fexofenadine Hcl) .... Take 1 Tablet By Mouth Once A Day 22)  Cvs Saline Nasal Spray 0.65 %  Soln (Saline) .... 2 Puffs Every 4 Hours As Needed 23)  Afrin Nasal Spray 0.05 % Soln (Oxymetazoline Hcl) .Marland Kitchen.. 1 Puff Two Times A Day X5days 24)  Tessalon Perles 100 Mg Caps (Benzonatate) .Marland Kitchen.. 1 - 2 By Mouth Three Times A Day As Needed  Allergies (verified): 1)  ! Pcn  Past History:  Past Medical History: Last updated: 06/20/2008 CHRONIC RHINITIS (ICD-472.0) EDEMA (ICD-782.3) ACUTE AND CHRONIC RESPIRATORY  FAILURE (ICD-518.84) MORBID OBESITY (ICD-278.01)     - Target wt  =   179  for BMI < 30 peak wt 282     - Referred back to nutrition December 18, 2007  DEEP VENOUS THROMBOPHLEBITIS, HX OF (ICD-V12.52) COPD (ICD-496)     - Pft's 07/25/06 FEV1 55% ratio 53, DLC0 51% DEPRESSION (ICD-311) OSTEOPOROSIS (ICD-733.00) last BMD 4/08 -2.4, intolerant of bisphosphonates  HYPERTENSION (ICD-401.9) Atrial fibrillation - paroxysmal Anticoagulation therapy Pulmonary embolism, hx of neg stress myoview 10/07 Anxiety glucose intolerance - on steroids Hyperlipidemia Complex medical regimen    - Med calendar done 05/01/2008  Past Surgical History: Last updated: 04/15/2008 Hysterectomy Tonsillectomy Skin Graft to midde R finger-1975 s/p left arm fracture with fall off chair  Family History: Last updated: 07/07/2007 asthma and brothers and son.   Heart disease in mother.   Throat cancer in brother.  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         all otherwise negative per pt   Physical Exam  General:  alert and overweight-appearing.   Head:  normocephalic and atraumatic.   Eyes:  vision grossly intact, pupils equal, and pupils round.   Ears:  R ear normal and L ear normal.   Nose:  no external deformity and no nasal discharge.   Mouth:  no gingival abnormalities and pharynx pink and moist.   Neck:  supple and no masses.   Lungs:  normal respiratory effort and normal breath sounds.   Heart:  normal rate and regular rhythm.   Abdomen:  soft, non-tender, and normal bowel sounds.   Msk:  no joint tenderness and no joint swelling.   Extremities:  no edema, no erythema  Neurologic:  cranial nerves II-XII intact and strength normal in all extremities.     Impression & Recommendations:  Problem # 1:  Preventive Health Care (ICD-V70.0)  Overall doing well, up to date, counseled on routine health concerns for screening and prevention, immunizations up to date or declined, labs ordered  Orders: TLB-BMP (Basic Metabolic Panel-BMET) (80048-METABOL) TLB-CBC Platelet - w/Differential (85025-CBCD) TLB-Hepatic/Liver Function Pnl (80076-HEPATIC) TLB-Lipid Panel (80061-LIPID) TLB-TSH (Thyroid Stimulating Hormone) (84443-TSH) TLB-Udip ONLY (81003-UDIP)  Complete Medication List: 1)  Furosemide 40 Mg Tabs (Furosemide) .... Take 1 tablet by mouth once a day and as needed for excessive swelling  2)  Coumadin 7.5 Mg Tabs (Warfarin sodium) .... Take asd 1 tablet by mouth once a day 3)  Klor-con 10 10 Meq Tbcr (Potassium chloride) .... Take 2 tabs every am and 1 tab at bedtime 4)  Fluoxetine Hcl 40 Mg Caps (Fluoxetine hcl) .... Take 1 tablet by mouth once a day 5)  Omeprazole 20 Mg Cpdr (Omeprazole) .Marland Kitchen.. 1 by mouth 30 min before 1st meal 6)  Rythmol Sr 325 Mg Cp12 (Propafenone hcl) .... Two times a day 7)  Veramyst 27.5 Mcg/spray Susp (Fluticasone furoate) .... One spray two times a day 8)  Symbicort 160-4.5 Mcg/act Aero (Budesonide-formoterol fumarate) .... Two puffs twice daily 9)  Exforge 10-320 Mg Tabs (Amlodipine besylate-valsartan) .Marland Kitchen.. 1tab by mouth once daily 10)  Spiriva Handihaler 18 Mcg Caps (Tiotropium bromide monohydrate) .... Inhale contents of 1 capsule once a day 11)  Oxygen 2l Liquid  .... Continuous 12)  Glucosamine-chondroitin Caps (Glucosamine-chondroit-vit c-mn) .... Take 1 capsule by mouth once a day 13)  Calcium Carbonate-vitamin D 600-400 Mg-unit Tabs (Calcium carbonate-vitamin d) .... Take 1 tablet by mouth two times a day 14)  Vitamin D 1000 Unit Tabs (Cholecalciferol) .... Take 1 tablet by mouth once a day 15)  Forteo 600 Mcg/2.2ml Soln (Teriparatide (recombinant)) .... 20 micrograms subcutaneously once daily 16)  Xopenex 1.25 Mg/7ml Nebu (Levalbuterol hcl) .... One every 4 hours as needed 17)  Xopenex Hfa 45 Mcg/act Aero (Levalbuterol tartrate) .... Inhale 2 puffs every 4 hours as needed 18)  Tylenol Arthritis Pain 650 Mg Cr-tabs (Acetaminophen) .... Per bottle 19)  Mucinex Dm 30-600 Mg Xr12h-tab (Dextromethorphan-guaifenesin) .Marland Kitchen.. 1 every 12 hours as needed 20)  Tramadol Hcl 50 Mg Tabs (Tramadol hcl) .... Take 1-2 tablet by mouth every 4 hours as  needed for cough 21)  Fexofenadine Hcl 180 Mg Tabs (Fexofenadine hcl) .... Take 1 tablet by mouth once a day 22)  Cvs Saline Nasal Spray 0.65 % Soln (Saline) .... 2 puffs every 4 hours as needed  23)  Afrin Nasal Spray 0.05 % Soln (Oxymetazoline hcl) .Marland Kitchen.. 1 puff two times a day x5days 24)  Tessalon Perles 100 Mg Caps (Benzonatate) .Marland Kitchen.. 1 - 2 by mouth three times a day as needed  Other Orders: Flu Vaccine 43yrs + (17616) Admin 1st Vaccine (07371)  Patient Instructions: 1)  you had the flu shot today 2)  Please go to the Lab in the basement for your blood and/or urine tests today 3)  Continue all previous medications as before this visit 4)  Please schedule a follow-up appointment in 6 months.   Immunizations Administered:  Influenza Vaccine # 1:    Vaccine Type: Fluvax 3+    Site: left deltoid    Mfr: [email or web address]    Dose: 0.5 ml    Route: IM    Given by: Margaret Pyle, CMA    Exp. Date: 07/10/2009    Lot #: [id number]  Flu Vaccine Consent Questions:    Do you have a history of severe allergic reactions to this vaccine? no    Any prior history of allergic reactions to egg and/or gelatin? no    Do you have a sensitivity to the preservative Thimersol? no    Do you have a past history of Guillan-Barre Syndrome? no    Do you currently have an acute febrile illness? no    Have you ever had a severe reaction to latex? no    Vaccine information given and explained to patient? yes    Are you currently pregnant? no

## 2010-02-10 NOTE — Medication Information (Signed)
Summary: CCR/AMD  Anticoagulant Therapy  Managed by: Charlena Cross, RN, BSN Referring MD: Valera Castle MD PCP: Gwen Pounds MD: Graciela Husbands MD,Isabell Bonafede Indication 1: Pulmonary embolism (ICD-415.19) Lab Used: [facility] Site: [place] INR POC 2.6 INR RANGE 2 - 3           Current Medications (verified): 1)  Furosemide 40 Mg  Tabs (Furosemide) .... Take 1 Tablet By Mouth Once A Day and As Needed For Excessive Swelling 2)  Coumadin 7.5 Mg Tabs (Warfarin Sodium) .... Take Asd 1 Tablet By Mouth Once A Day 3)  Klor-Con 10 10 Meq Tbcr (Potassium Chloride) .... Take 2 Tabs Every Am and 1 Tab At Bedtime 4)  Fluoxetine Hcl 40 Mg Caps (Fluoxetine Hcl) .... Take 1 Tablet By Mouth Once A Day 5)  Omeprazole 20 Mg Cpdr (Omeprazole) .Marland Kitchen.. 1 By Mouth 30 Min Before 1st Meal 6)  Propafenone Hcl 325 Mg Xr12h-Cap (Propafenone Hcl) .Marland Kitchen.. 1 Tab Daily 7)  Nasonex 50 Mcg/act Susp (Mometasone Furoate) .... 2 Puffs Daily 8)  Symbicort 160-4.5 Mcg/act  Aero (Budesonide-Formoterol Fumarate) .... Two Puffs Twice Daily 9)  Exforge 10-320 Mg  Tabs (Amlodipine Besylate-Valsartan) .Marland Kitchen.. 1tab By Mouth Once Daily 10)  Spiriva Handihaler 18 Mcg  Caps (Tiotropium Bromide Monohydrate) .... Inhale Contents of 1 Capsule Once A Day 11)  Oxygen 2l Liquid .... Continuous 12)  Glucosamine-Chondroitin   Caps (Glucosamine-Chondroit-Vit C-Mn) .... Take 1 Capsule By Mouth Once A Day 13)  Calcium Carbonate-Vitamin D 600-400 Mg-Unit  Tabs (Calcium Carbonate-Vitamin D) .... Take 1 Tablet By Mouth Two Times A Day 14)  Vitamin D 1000 Unit Tabs (Cholecalciferol) .... Take 1 Tablet By Mouth Once A Day 15)  Xopenex 1.25 Mg/41ml  Nebu (Levalbuterol Hcl) .... One Every 4 Hours As Needed 16)  Xopenex Hfa 45 Mcg/act  Aero (Levalbuterol Tartrate) .... Inhale 2 Puffs Every 4 Hours As Needed 17)  Tylenol Arthritis Pain 650 Mg Cr-Tabs (Acetaminophen) .... Per Bottle 18)  Mucinex Dm 30-600 Mg Xr12h-Tab  (Dextromethorphan-Guaifenesin) .Marland Kitchen.. 1 Every 12 Hours As Needed 19)  Tramadol Hcl 50 Mg  Tabs (Tramadol Hcl) .... Take 1-2 Tablet By Mouth Every 4 Hours As  Needed For Cough 20)  Fexofenadine Hcl 180 Mg Tabs (Fexofenadine Hcl) .... Take 1 Tablet By Mouth Once A Day 21)  [facility] Saline Nasal Spray 0.65 %  Soln (Saline) .... 2 Puffs Every 4 Hours As Needed 22)  Afrin Nasal Spray 0.05 % Soln (Oxymetazoline Hcl) .Marland Kitchen.. 1 Puff Two Times A Day X5days 23)  Hydrocodone-Homatropine 5-1.5 Mg/91ml Syrp (Hydrocodone-Homatropine) .Marland Kitchen.. 1 Tsp By Mouth Q 6 Hrs As Needed Cough 24)  Avelox 400 Mg Tabs (Moxifloxacin Hcl) .Marland Kitchen.. 1 By Mouth Once Daily 25)  Prednisone 10 Mg Tabs (Prednisone) .... 6po Qd For 3days, Then 4po Qd For 3days, Then 3po Qd For 3days, Then 2po Qd For 3days, Then 1po Qd For 3 Days, Then Stop  Allergies (verified): 1)  ! Pcn  Anticoagulation Management History:      The patient is taking warfarin and comes in today for a routine follow up visit.  Negative risk factors for bleeding include an age less than 28 years old.  The bleeding index is 'low risk'.  Positive CHADS2 values include History of HTN.  Negative CHADS2 values include Age > 75 years old.  The start date was 06/13/2008.  Her last INR was 5.6.  Anticoagulation responsible provider: Graciela Husbands MD,Akelia Husted.  INR POC: 2.6.    Anticoagulation Management Assessment/Plan:  The patient's current anticoagulation dose is Coumadin 7.5 mg tabs: Take asd 1 tablet by mouth once a day.  The target INR is 2 - 3.  The next INR is due 04/28/2009.  Anticoagulation instructions were given to patient.  Results were reviewed/authorized by Charlena Cross, RN, BSN.  She was notified by Charlena Cross, RN, BSN.         Prior Anticoagulation Instructions: The patient is to continue with the same dose of coumadin.  This dosage includes: coumadin 11.25 mg daily with 7.5 mg on Sun Wed Fri  Current Anticoagulation Instructions: Same as Prior Instructions.

## 2010-02-10 NOTE — Assessment & Plan Note (Signed)
Summary: FOLLOW UP/ MBW    Visit Type:  extended PCP:  John  Chief Complaint:  sinuses draining...eyes watering...reviewed meds....  History of Present Illness: 60 yo white female former smoker with documented COPD with minimum asthmatic component debilitated as much by obesity she is by airflow obstruction.  Presented 2/12  for an acute office visit for 3 days of cough, congestion, nasal stuffiness, fever. Denies chest pain, dyspnea, orthopnea, hemoptysis, , n/v/d, edema. green mucus rx by NP with doxy, better now, in for overall check on breathing  baseline = walking 50 ft on 02 24 h per day. Pt denies any significant sore throat, nasal congestion or excess secretions, fever, chills, sweats, unintended wt loss, pleuritic or exertional cp, orthopnea pnd or leg swelling.  Current Meds:  COUMADIN 7.5 MG TABS (WARFARIN SODIUM) Take 1 tablet by mouth once a day, 11mg  on Mondays FLUOXETINE HCL 40 MG CAPS (FLUOXETINE HCL) Take 1 tablet by mouth once a day KLOR-CON 10 10 MEQ TBCR (POTASSIUM CHLORIDE) Take 1 tablet by mouth once a day LASIX 40 MG TABS (FUROSEMIDE) Take 1 tablet by mouth once a day RYTHMOL SR 325 MG CP12 (PROPAFENONE HCL) two times a day PROTONIX 40 MG  TBEC (PANTOPRAZOLE SODIUM) two times a day VERAMYST 27.5 MCG/SPRAY  SUSP (FLUTICASONE FUROATE) one spray two times a day SYMBICORT 80-4.5 MCG/ACT  AERO (BUDESONIDE-FORMOTEROL FUMARATE) two puffs two times a day AMLODIPINE BESYLATE 10 MG  TABS (AMLODIPINE BESYLATE) once daily DIOVAN 160 MG  TABS (VALSARTAN) once daily SPIRIVA HANDIHALER 18 MCG  CAPS (TIOTROPIUM BROMIDE MONOHYDRATE) once daily ULTRAM 50 MG  TABS (TRAMADOL HCL) one every four hours as needed ZYRTEC ALLERGY 10 MG  TABS (CETIRIZINE HCL) one tab at bedtime as needed DOXYCYCLINE MONOHYDRATE 100 MG  CAPS (DOXYCYCLINE MONOHYDRATE) By mouth twice daily PREDNISONE 10 MG  TABS (PREDNISONE) 4x2, 3x2, 2x2,1x2 LEVAQUIN 750 MG  TABS (LEVOFLOXACIN) One tablet by mouth daily  XOPENEX 1.25 MG/3ML  NEBU (LEVALBUTEROL HCL) one every 6 hours as needed DOXYCYCLINE MONOHYDRATE 100 MG  CAPS (DOXYCYCLINE MONOHYDRATE) 1 by mouth two times a day     Serial Vital Signs/Assessments:  Comments: 11:05 AM Ambulatory Pulse Oximetry  Resting; HR__82___    02 Sat__97%2lpm___  Lap1 (185 feet)   HR__110___   02 Sat__92%2lpm___ Lap2 (185 feet)   HR__105___   02 Sat__91%2lmp___    Lap3 (185 feet)   HR__106___   02 Sat__91%lmp___  _x__Test Completed without Difficulty ___Test Stopped due to:   By: Vernie Murders      Current Allergies: ! PCN  Past Medical History:        OBSTRUCTIVE CHRONIC BRONCHITIS WITH EXACERBATION (ICD-491.21)    ACUTE AND CHRONIC RESPIRATORY FAILURE (ICD-518.84)    MORBID OBESITY (ICD-278.01)    DEEP VENOUS THROMBOPHLEBITIS, HX OF (ICD-V12.52)    COPD (ICD-496) FEV1 55% predicted ratio 53% and no improvement after bronchodilators by PFTs 07/25/06 with disproportionate reduction and ERV at 28%    DEPRESSION (ICD-311)    OSTEOPOROSIS (ICD-733.00)    HYPERTENSION (ICD-401.9)        Social History:    Patient states former smoker quit smoking in 2003    Review of Systems  The patient denies anorexia, fever, weight loss, vision loss, decreased hearing, hoarseness, chest pain, syncope, peripheral edema, hemoptysis, abdominal pain, melena, hematochezia, severe indigestion/heartburn, hematuria, incontinence, muscle weakness, suspicious skin lesions, transient blindness, difficulty walking, depression, unusual weight change, abnormal bleeding, and enlarged lymph nodes.     Vital Signs:  Patient Profile:  60 Years Old Female Height:     66 inches Weight:      282 pounds BMI:     45.68 O2 Sat:      97 % O2 treatment:    oxygen 2L Temp:     98.3 degrees F oral Pulse rate:   87 / minute BP sitting:   126 / 72  (left arm) Cuff size:   large  Pt. in pain?   no  Vitals Entered By: Clarise Cruz Duncan Dull) (March 01, 2007 10:16 AM)                   Physical Exam  In general she is an obese chronically ill white female in no acute distress. HEENT mild turbinate edema.  Oropharynx no thrush or excess pnd or cobblestoning.  No JVD or cervical adenopathy. Mild accessory muscle hypertrophy. Trachea midline, nl thryroid. Chest was hyperinflated by percussion with diminished breath sounds and moderate increased exp time without wheeze. Hoover sign positive at mid inspiration. Regular rate and rhythm without murmur gallop or rub or increase P2.  ZOX:[id number] obese but  no hsm, nl excursion. Ext warm without C,C or E.       Impression & Recommendations:  Problem # 1:  ACUTE AND CHRONIC RESPIRATORY FAILURE (ICD-518.84) She is chronically O2 dependent more because of obesity then I believe COPD with no significant asthmatic component on her present regimen. note that her oxygen saturations are adequate x 3 laps around the office on her present regimen demonstrating that she can in fact ambulate a reasonable pace without being limited due to dyspnea or desaturation.  Each maintenance medication was reviewed in detail including most importantly the difference between maintenance and as needed and under what circumstances the prns are to be used.  she has struggled in the past for the concept of medication reconciliation but seems to be doing better at present with no significant cough on her present complex regimen. Orders: Est. Patient Level IV (81191)   Problem # 2:  MORBID OBESITY (ICD-278.01) this is the most potentially reversible problem that she has.    Weight control is a matter of calorie balance which needs to be tilted in the pt's favor by eating less and exercising more.  Specifically, I recommended  exercise at a level where pt  is short of breath but not out of breath 30 minutes daily.  If not losing weight on this program, I would strongly recommend pt see a nutritionist with a food diary recorded for two weeks prior to the visit.    Orders: Est. Patient Level IV (47829)    Patient Instructions: 1)  Weight control is simply a matter of calorie balance which needs to be tilted in your favor by eating less and exercising more.  To get the most out of exercise, you need to be continuously aware that you are short of breath, but never out of breath, for 30 minutes daily. As you improve, it will actually be easier for you to do the same amount in  30 minutes so always push to the level where you are short of breath.  If this does not result in gradual weight reduction,  I recommend  a nutritionist for a food diary. 2)  See calendar for specific medication instructions and bring it back for each and every office visit for every healthcare provider you see.  Without it,  you may not receive the best quality medical care that we feel you  deserve.  3)  Please schedule a follow-up appointment in 2 weeks with [name] with all your medications in two bags    ]

## 2010-02-10 NOTE — Assessment & Plan Note (Signed)
Summary: HEADACHE COUGH GREEN SPUTUM/ MBW   Primary Provider/Referring Provider:  Jonny Ruiz  CC:  Pt c/o productive cough with green phlegm x 4 days. Pt has taken Zyrtec and mucinex x 4 days with noi relief..  History of Present Illness: 60 yowf last smoked 2003 with   COPD with minimum asthmatic component with an FEV1 of 55%, a ratio of 53%, and a diffusing capacity 51% documented 07/25/06.  On 02 24 hours per day at 2lpm  6/09  weighed 271 and it was felt she was as debilitated as much by obesity as by airflow obstruction. baseline = when well = walk to mother in law's house but stops at least once because it's uphill and frequently requiring rescue rx.  January 22, 2008 --returns post hospital , admitted 12/29-01/15/07 for CAP -LLL , tx with Avelox (tot. 10 d)  Since discharge, doing better not back to baseline. -CXR improved but small residual density.   February 07, 2008--Complains that she has sore throat, hoarseness, cough, congestion, watery eyes, DOE, wheezing over last 3 days. Since last visit was improving and returned back to baseline  but 3 days ago, cough started again. green mucus w/ nasal discharge --Levaquin x 5 days.   March 01, 2008--Presents for a post hospitalization follow up. Admitted 02/23/08 for post op respiratory difficulties. she underwent  left Wrist surgery, post op dyspnea, cough/wheezing. Admitted w/ aggressive resp toilet and IV steroids. Discharged. 02/27/08 improved.   March 20, 2008 ov for fu cxr with no change in chronic doe and minimal cough.    April 09, 2008 --Present for an acute office visit-Complains of productive cough with green phlegm x 4 days. Pt has taken Zyrtec and mucinex x 4 days with no relief. Denies chest pain, orthopnea, hemoptysis, fever, n/v/d, edema, headache.  Current Medications (verified): 1)  Furosemide 40 Mg  Tabs (Furosemide) .... Take 1 Tablet By Mouth Once A Day  2)  Coumadin 7.5 Mg Tabs (Warfarin Sodium) .... Take Asd 1 Tablet By Mouth Once A Day 3)  Klor-Con 10 10 Meq Tbcr (Potassium Chloride) .... Take 2 Tablets By Mouth Once A Day 4)  Fluoxetine Hcl 40 Mg Caps (Fluoxetine Hcl) .... Take 1 Tablet By Mouth Once A Day 5)  Cvs Omeprazole 20 Mg Tbec (Omeprazole) .Marland Kitchen.. 1 Tablet By Mouth 30 Minutes Before The First Meal 6)  Rythmol Sr 325 Mg Cp12 (Propafenone Hcl) .... Two Times A Day 7)  Veramyst 27.5 Mcg/spray  Susp (Fluticasone Furoate) .... One Spray Two Times A Day 8)  Symbicort 160-4.5 Mcg/act  Aero (Budesonide-Formoterol Fumarate) .... Two Puffs Twice Daily 9)  Exforge 10-320 Mg  Tabs (Amlodipine Besylate-Valsartan) .Marland Kitchen.. 1tab By Mouth Once Daily 10)  Spiriva Handihaler 18 Mcg  Caps (Tiotropium Bromide Monohydrate) .... Inhale Contents of 1 Capsule Once A Day 11)  Oxygen 2l Liquid .... Continuous 12)  Glucosamine-Chondroitin   Caps (Glucosamine-Chondroit-Vit C-Mn) .... Take 1 Capsule By Mouth Once A Day 13)  Calcium Carbonate-Vitamin D 600-400 Mg-Unit  Tabs (Calcium Carbonate-Vitamin D) .... Take 1 Tablet By Mouth Two Times A Day 14)  Vitamin D 1000 Unit Tabs (Cholecalciferol) .... Take 1 Tablet By Mouth Once A Day 15)  Forteo 600 Mcg/2.23ml Soln (Teriparatide (Recombinant)) .... 20 Micrograms Subcutaneously Once Daily 16)  Xopenex 1.25 Mg/86ml  Nebu (Levalbuterol Hcl) .... One Every 6 Hours As Needed 17)  Xopenex Hfa 45 Mcg/act  Aero (Levalbuterol Tartrate) .... Inhale 2 Puffs Every 4 Hours As Needed 18)  Tylenol 325 Mg  Tabs (Acetaminophen) .... Per Bottle 19)  Tramadol Hcl 50 Mg  Tabs (Tramadol Hcl) .... Take 1 Tablet By Mouth Every 4 Hours As Needed 20)  Zyrtec Allergy 10 Mg  Tabs (Cetirizine Hcl) .... One Tab At Bedtime As Needed 21)  Cvs Saline Nasal Spray 0.65 %  Soln (Saline) .... 2 Puffs Every 4 Hours As Needed 22)  Afrin Nasal Spray 0.05 % Soln (Oxymetazoline Hcl) .... Use Asd 5 X Per Day  23)  Mucinex Dm 30-600 Mg Xr12h-Tab (Dextromethorphan-Guaifenesin) .... Take Prn 24)  Vicodin 5-500 Mg Tabs (Hydrocodone-Acetaminophen) .Marland Kitchen.. 1 Q4h As Needed Pain 25)  Avelox 400 Mg Tabs (Moxifloxacin Hcl) .Marland Kitchen.. 1 By Mouth Once Daily  Allergies (verified): 1)  ! Pcn  Past History:  Past Medical History:    CHRONIC RHINITIS (ICD-472.0)    EDEMA (ICD-782.3)    ACUTE AND CHRONIC RESPIRATORY FAILURE (ICD-518.84)    MORBID OBESITY (ICD-278.01)        - Target wt  =   179  for BMI < 30         - Referred back to nutrition December 18, 2007     DEEP VENOUS THROMBOPHLEBITIS, HX OF (ICD-V12.52)    COPD (ICD-496)        - Pft's 07/25/06 FEV1 55% ratio 53, DLC0 51%    DEPRESSION (ICD-311)    OSTEOPOROSIS (ICD-733.00) last BMD 4/08 -2.4, intolerant of bisphosphonates     HYPERTENSION (ICD-401.9)    Atrial fibrillation - paroxysmal    Anticoagulation therapy    Pulmonary embolism, hx of    neg stress myoview 10/07    Anxiety    glucose intolerance - on steroids    Hyperlipidemia (12/18/2007)  Past Surgical History:    Hysterectomy    Tonsillectomy    Skin Graft to midde R finger-1975     (07/29/2006)  Family History:    asthma and brothers and son.      Heart disease in mother.      Throat cancer in brother. 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)  Risk Factors:    Smoking Status: quit (02/07/2008)    Packs/Day: N/A    Cigars/wk: N/A    Pipe Use/wk: N/A    Cans of tobacco/wk: N/A    Passive Smoke Exposure: N/A  Review of Systems      See HPI  Vital Signs:  Patient profile:   60 year old female Height:      65 inches Weight:      275.38 pounds O2 Sat:      97 % Temp:     99.0 degrees F oral Pulse rate:   78 / minute BP sitting:   122 / 86  (left arm) Cuff size:   large   Vitals Entered By: Carron Curie CMA (April 09, 2008 4:26 PM)  O2 Sat at Rest %:  97 O2 Flow:  2 L CC: Pt c/o productive cough with green phlegm x 4 days. Pt has taken Zyrtec and mucinex x 4 days with noi relief. Comments Medications reviewed with patient Carron Curie CMA  April 09, 2008 4:31 PM    Physical Exam  Additional Exam:  Ambulatory healthy appearing in no acute distress. Afeb with normal vital signs     280 February 07, 2008 > 284 March 20, 2008 >275 April 09, 2008 HEENT: nl dentition, turbinates, and orophanx. Nl external ear canals without cough reflex Neck without JVD/Nodes/TM Lungs coarse BS w/ few rhonchi RRR no m/r/g Abd soft and benign with nl excursion in the supine position. No bruits or organomegaly Ext warm without calf tenderness, cyanosis clubbing or edema Skin warm and dry without lesions     Impression & Recommendations:  Problem # 1:  OBSTRUCTIVE CHRONIC BRONCHITIS WITH EXACERBATION (ICD-491.21) Not improving  ZOX:WRUEAV 400mg  once daily for 7 days Mucinex DM two times a day as needed cough/congestion Use med calendar to help w/ cough control meds.  Please contact office for sooner follow up if symptoms do not improve or worsen  follow up as scheduled and as needed   Medications Added to Medication List This Visit: 1)  Avelox 400 Mg Tabs (Moxifloxacin hcl) .Marland Kitchen.. 1 by mouth once daily  Complete Medication List: 1)  Furosemide 40 Mg Tabs (Furosemide) .... Take 1 tablet by mouth once a day 2)  Coumadin 7.5 Mg Tabs (Warfarin sodium) .... Take asd 1 tablet by mouth once a day 3)  Klor-con 10 10 Meq Tbcr (Potassium chloride) .... Take 2 tablets by mouth once a day 4)  Fluoxetine Hcl 40 Mg Caps (Fluoxetine hcl) .... Take 1 tablet by mouth once a day 5)  Cvs Omeprazole 20 Mg Tbec (Omeprazole) .Marland Kitchen.. 1 tablet by mouth 30 minutes before the first meal 6)  Rythmol Sr 325 Mg Cp12 (Propafenone hcl) .... Two times a day  7)  Veramyst 27.5 Mcg/spray Susp (Fluticasone furoate) .... One spray two times a day 8)  Symbicort 160-4.5 Mcg/act Aero (Budesonide-formoterol fumarate) .... Two puffs twice daily 9)  Exforge 10-320 Mg Tabs (Amlodipine besylate-valsartan) .Marland Kitchen.. 1tab by mouth once daily 10)  Spiriva Handihaler 18 Mcg Caps (Tiotropium bromide monohydrate) .... Inhale contents of 1 capsule once a day 11)  Oxygen 2l Liquid  .... Continuous 12)  Glucosamine-chondroitin Caps (Glucosamine-chondroit-vit c-mn) .... Take 1 capsule by mouth once a day 13)  Calcium Carbonate-vitamin D 600-400 Mg-unit Tabs (Calcium carbonate-vitamin d) .... Take 1 tablet by mouth two times a day 14)  Vitamin D 1000 Unit Tabs (Cholecalciferol) .... Take 1 tablet by mouth once a day 15)  Forteo 600 Mcg/2.88ml Soln (Teriparatide (recombinant)) .... 20 micrograms subcutaneously once daily 16)  Xopenex 1.25 Mg/37ml Nebu (Levalbuterol hcl) .... One every 6 hours as needed 17)  Xopenex Hfa 45 Mcg/act Aero (Levalbuterol tartrate) .... Inhale 2 puffs every 4 hours as needed 18)  Tylenol 325 Mg Tabs (Acetaminophen) .... Per bottle 19)  Tramadol Hcl 50 Mg Tabs (Tramadol hcl) .... Take 1 tablet by mouth every 4 hours as needed 20)  Zyrtec Allergy 10 Mg Tabs (Cetirizine hcl) .... One tab at bedtime as needed 21)  [facility]vs Saline Nasal Spray 0.65 % Soln (Saline) .... 2 puffs every 4 hours as needed 22)  Afrin Nasal Spray 0.05 % Soln (Oxymetazoline hcl) .... Use asd 5 x per day 23)  Mucinex Dm 30-600 Mg Xr12h-tab (Dextromethorphan-guaifenesin) .... Take prn 24)  Vicodin 5-500 Mg Tabs (Hydrocodone-acetaminophen) .Marland Kitchen.. 1 q4h as needed pain 25)  Avelox 400 Mg Tabs (Moxifloxacin hcl) .Marland Kitchen.. 1 by mouth once daily  Other Orders: Est. Patient Level IV (40981)  Patient Instructions: 1)  Avelox 400mg  once daily for 7 days 2)  Mucinex DM two times a day as needed cough/congestion 3)  Use med calendar to help w/ cough control meds.   4)  Please contact office for sooner follow up if symptoms do not improve or worsen  5)  follow up as scheduled and as needed  Prescriptions: AVELOX 400 MG TABS (MOXIFLOXACIN HCL) 1 by mouth once daily  #7 x 0   Entered and Authorized by:   Rubye Oaks NP   Signed by:   Rubye Oaks NP on 04/09/2008   Method used:   Electronically to        [facility] [phone]* (retail)       [address]       [place], [place]  [id number]       Ph: [phone] or [phone]       Fax: [phone]   RxID:   [id number]

## 2010-02-10 NOTE — Letter (Signed)
Summary: [facility] information [facility] manager [facility]  [address]   [place], [place] [address]   Phone: [phone]  Fax: [phone]    July 18, 2009  Chief [address] [place]  [address]  [place], [place] [address]  GE:[id number] BREISCH  Juror: # [id number]   Dear Milford Cage:   Leroy Libman of [facility], [place], [place] has just informed me that she has been chosen to serve on the jury beginning the 17th day of August, 2011.  Ms. Star is a patient under my care with the diagnosis of Chronic Obstructive Pulmonary Disease, Acute and Chronic Respiratory Failure, Obstructive Chronic Bronchitis, Oxygen Dependency. I do not feel that she should serve on the jury. I would like to request that she be excused from jury duty permanently.  Your consideration of this matter is greatly appreciated.  Respectfully,     [name] Parrett, N.P.

## 2010-02-10 NOTE — Progress Notes (Signed)
Summary: prescript  Phone Note Call from Patient   Caller: Patient Call For: parrett Summary of Call: antibiotic prescript didn't go through [facility] Initial call taken by: Rickard Patience,  April 10, 2008 8:16 AM  Follow-up for Phone Call        [facility] not [facility]  Rx was called in-spoke with pt and made aware. Follow-up by: Vernie Murders,  April 10, 2008 9:13 AM

## 2010-02-10 NOTE — Assessment & Plan Note (Signed)
Summary: CHECK UP/CONSULT/$50/PN   Vital Signs:  Patient Profile:   60 Years Old Female Height:     66 inches Weight:      276 pounds Temp:     97.7 degrees F oral Pulse rate:   58 / minute BP sitting:   135 / 87  (right arm) Cuff size:   large  Pt. in pain?   no  Vitals Entered By: Maris Berger (June 19, 2007 2:04 PM)                  Preventive Care Screening  Bone Density:    Date:  04/18/2006    Next Due:  04/2008    Results:  abnormal std dev   PCP:  Jonny Ruiz  Chief Complaint:  B/P problem.  History of Present Illness: BP labile more recently - up to 150 / 102 per pt sometimes at home; ovccasional sweats during the day she is worried may be due to the BP elev, but BP always seems to come down, and HR in the range on the SBP seems to 151 - 124; ave seems to be about 130's    Updated Prior Medication List: LASIX 40 MG TABS (FUROSEMIDE) Take 1 tablet by mouth once a day COUMADIN 7.5 MG TABS (WARFARIN SODIUM) Take 1 tablet by mouth once a day, 11mg  on Mondays KLOR-CON 10 10 MEQ TBCR (POTASSIUM CHLORIDE) Take 1 tablet by mouth once a day FLUOXETINE HCL 40 MG CAPS (FLUOXETINE HCL) Take 1 tablet by mouth once a day OMEPRAZOLE 20 MG  CPDR (OMEPRAZOLE) Take 1 capsule by mouth once a day RYTHMOL SR 325 MG CP12 (PROPAFENONE HCL) two times a day VERAMYST 27.5 MCG/SPRAY  SUSP (FLUTICASONE FUROATE) one spray two times a day SYMBICORT 160-4.5 MCG/ACT  AERO (BUDESONIDE-FORMOTEROL FUMARATE) Two puffs twice daily EXFORGE 10-320 MG  TABS (AMLODIPINE BESYLATE-VALSARTAN) 1po once daily SPIRIVA HANDIHALER 18 MCG  CAPS (TIOTROPIUM BROMIDE MONOHYDRATE) Inhale contents of 1 capsule once a day * OXYGEN 2L continuous XOPENEX 1.25 MG/3ML  NEBU (LEVALBUTEROL HCL) one every 6 hours as needed XOPENEX HFA 45 MCG/ACT  AERO (LEVALBUTEROL TARTRATE) inhale 2 puffs every 4 hours as needed TYLENOL 325 MG  TABS (ACETAMINOPHEN) per bottle  MUCINEX DM 30-600 MG  TB12 (DEXTROMETHORPHAN-GUAIFENESIN) 1-2 every 12 hours as needed ZYRTEC ALLERGY 10 MG  TABS (CETIRIZINE HCL) one tab at bedtime as needed [facility] SALINE NASAL SPRAY 0.65 %  SOLN (SALINE) 2 puffs every 4 hours as needed AFRIN NASAL SPRAY 0.05 %  SOLN (OXYMETAZOLINE HCL) 1 puff two times a day x5 days FUROSEMIDE 20 MG  TABS (FUROSEMIDE) 1 extra daily as needed  Current Allergies (reviewed today): ! PCN  Past Medical History:    Reviewed history from 05/26/2007 and no changes required:       CHRONIC RHINITIS (ICD-472.0)       EDEMA (ICD-782.3)       OBSTRUCTIVE CHRONIC BRONCHITIS WITH EXACERBATION (ICD-491.21)       ACUTE AND CHRONIC RESPIRATORY FAILURE (ICD-518.84)       MORBID OBESITY (ICD-278.01)       DEEP VENOUS THROMBOPHLEBITIS, HX OF (ICD-V12.52)       COPD (ICD-496)       DEPRESSION (ICD-311)       OSTEOPOROSIS (ICD-733.00)       HYPERTENSION (ICD-401.9)       Atrial fibrillation - paroxysmal       Anticoagulation therapy       Pulmonary embolism, hx of  neg stress myoview 10/07       Anxiety       glucose intolerance - on steroids       Hyperlipidemia  Past Surgical History:    Reviewed history from 07/29/2006 and no changes required:       Hysterectomy       Tonsillectomy       Skin Graft to midde R finger-1975   Family History:    Reviewed history from 01/19/2007 and no changes required:       asthma and brothers and son.  Heart disease in mother.  Throat cancer in brother.  Social History:    Reviewed history from 03/01/2007 and no changes required:       Patient states former smoker quit smoking in 2003       Married       3 children       Alcohol use-no       retired 10/07 - disabled - former [employer]   Risk Factors:  Alcohol use:  no   Review of Systems   The patient denies anorexia, fever, weight loss, weight gain, vision loss, decreased hearing, hoarseness, chest pain, syncope, dyspnea on exertion, peripheral edema, prolonged cough, headaches, hemoptysis, abdominal pain, melena, hematochezia, severe indigestion/heartburn, hematuria, incontinence, genital sores, muscle weakness, suspicious skin lesions, transient blindness, difficulty walking, depression, unusual weight change, abnormal bleeding, enlarged lymph nodes, angioedema, and breast masses.         all otherwise negative    Physical Exam  General:     Well-developed,well-nourished,in no acute distress; alert,appropriate and cooperative throughout examination Head:     Normocephalic and atraumatic without obvious abnormalities. No apparent alopecia or balding. Eyes:     No corneal or conjunctival inflammation noted. EOMI. Perrla Ears:     External ear exam shows no significant lesions or deformities.  Otoscopic examination reveals clear canals, tympanic membranes are intact bilaterally without bulging, retraction, inflammation or discharge. Hearing is grossly normal bilaterally. Nose:     External nasal examination shows no deformity or inflammation. Nasal mucosa are pink and moist without lesions or exudates. Mouth:     Oral mucosa and oropharynx without lesions or exudates.  Teeth in good repair. Neck:     No deformities, masses, or tenderness noted. Lungs:     R decreased breath sounds and L decreased breath sounds.   Heart:     Normal rate and regular rhythm. S1 and S2 normal without gallop, murmur, click, rub or other extra sounds. Extremities:     no edema, no ulcers     Impression & Recommendations:  Problem # 1:  HYPERTENSION (ICD-401.9)  The following medications were removed from the medication list:    Amlodipine Besylate 10 Mg Tabs (Amlodipine besylate) .Marland Kitchen... 1 tab by mouth once daily  Her updated medication list for this problem includes:     Lasix 40 Mg Tabs (Furosemide) .Marland Kitchen... Take 1 tablet by mouth once a day    Exforge 10-320 Mg Tabs (Amlodipine besylate-valsartan) .Marland Kitchen... 1po once daily    Furosemide 20 Mg Tabs (Furosemide) .Marland Kitchen... 1 extra daily as needed stable overall by hx and exam, ok to continue meds/tx as is - combined the amlodipine and diovan for the above to reduce pill burden   Problem # 2:  HYPERLIPIDEMIA (ICD-272.4)  Labs Reviewed: Chol: 194 (03/17/2006)   HDL: 33.9 (03/17/2006)   LDL: DEL (03/17/2006)   TG: 222 (03/17/2006) SGOT: 23 (08/29/2006)   SGPT: 17 (08/29/2006) check lipds, consider  pravachol for ldl > 100  Problem # 3:  Preventive Health Care (ICD-V70.0) Overall doing well, up to date, counseled on routine health concerns for screening and prevention, immunizations up to date or declined, labs ordered   Orders: TLB-BMP (Basic Metabolic Panel-BMET) (80048-METABOL) TLB-CBC Platelet - w/Differential (85025-CBCD) TLB-Hepatic/Liver Function Pnl (80076-HEPATIC) TLB-TSH (Thyroid Stimulating Hormone) (84443-TSH) TLB-Udip ONLY (81003-UDIP)   Complete Medication List: 1)  Lasix 40 Mg Tabs (Furosemide) .... Take 1 tablet by mouth once a day 2)  Coumadin 7.5 Mg Tabs (Warfarin sodium) .... Take 1 tablet by mouth once a day, 11mg  on mondays 3)  Klor-con 10 10 Meq Tbcr (Potassium chloride) .... Take 1 tablet by mouth once a day 4)  Fluoxetine Hcl 40 Mg Caps (Fluoxetine hcl) .... Take 1 tablet by mouth once a day 5)  Omeprazole 20 Mg Cpdr (Omeprazole) .... Take 1 capsule by mouth once a day 6)  Rythmol Sr 325 Mg Cp12 (Propafenone hcl) .... Two times a day 7)  Veramyst 27.5 Mcg/spray Susp (Fluticasone furoate) .... One spray two times a day 8)  Symbicort 160-4.5 Mcg/act Aero (Budesonide-formoterol fumarate) .... Two puffs twice daily 9)  Exforge 10-320 Mg Tabs (Amlodipine besylate-valsartan) .Marland Kitchen.. 1po once daily  10)  Spiriva Handihaler 18 Mcg Caps (Tiotropium bromide monohydrate) .... Inhale contents of 1 capsule once a day 11)  Oxygen 2l  .... Continuous 12)  Xopenex 1.25 Mg/64ml Nebu (Levalbuterol hcl) .... One every 6 hours as needed 13)  Xopenex Hfa 45 Mcg/act Aero (Levalbuterol tartrate) .... Inhale 2 puffs every 4 hours as needed 14)  Tylenol 325 Mg Tabs (Acetaminophen) .... Per bottle 15)  Mucinex Dm 30-600 Mg Tb12 (Dextromethorphan-guaifenesin) .Marland Kitchen.. 1-2 every 12 hours as needed 16)  Zyrtec Allergy 10 Mg Tabs (Cetirizine hcl) .... One tab at bedtime as needed 17)  [facility] Saline Nasal Spray 0.65 % Soln (Saline) .... 2 puffs every 4 hours as needed 18)  Afrin Nasal Spray 0.05 % Soln (Oxymetazoline hcl) .Marland Kitchen.. 1 puff two times a day x5 days 19)  Furosemide 20 Mg Tabs (Furosemide) .Marland Kitchen.. 1 extra daily as needed  Other Orders: TLB-A1C / Hgb A1C (Glycohemoglobin) (83036-A1C)   Patient Instructions: 1)  you will have blood work today 2)  finish what you have of the amlodipine and the diovan 3)  start the exforge 10-320 after that 4)  continue all medications that you may have been taking previously 5)  Please schedule a follow-up appointment in 1 year or sooner if needed   Prescriptions: EXFORGE 10-320 MG  TABS (AMLODIPINE BESYLATE-VALSARTAN) 1po once daily  #90 x 3   Entered and Authorized by:   Corwin Levins MD   Signed by:   Corwin Levins MD on 06/19/2007   Method used:   Print then Give to Patient   RxID:   [id number] EXFORGE 10-320 MG  TABS (AMLODIPINE BESYLATE-VALSARTAN) 1po once daily  #90 x 3   Entered and Authorized by:   Corwin Levins MD   Signed by:   Corwin Levins MD on 06/19/2007   Method used:   Print then Give to Patient   RxID:   [email or web address]  ]  Appended Document: Orders Update    Clinical Lists Changes  Orders: Added new Test order of TLB-Udip w/ Micro (81001-URINE) - Signed

## 2010-02-10 NOTE — Medication Information (Signed)
Summary: Coumadin Clinic   Referring MD: Valera Castle MD PCP: Jonny Ruiz Indication 1: Pulmonary embolus (ICD-415.19) Lab Used: [facility] Site: [place] INR RANGE 2 - 3           Allergies: 1)  ! Pcn  Anticoagulation Management History:      Negative risk factors for bleeding include an age less than 60 years old.  The bleeding index is 'low risk'.  Positive CHADS2 values include History of HTN.  Negative CHADS2 values include Age > 86 years old.  The start date was 11/28/2004.  Her last INR was 3.9 RATIO.    Anticoagulation Management Assessment/Plan:      The patient's current anticoagulation dose is Coumadin 7.5 mg tabs: Take asd 1 tablet by mouth once a day.  The target INR is 2 - 3.         Prior Anticoagulation Instructions: 7.5mg  qd / 11.25mg   WE,SAT

## 2010-02-10 NOTE — Miscellaneous (Signed)
Summary: med list update  Clinical Lists Changes  Medications: Removed medication of BENAZEPRIL HCL 40 MG TABS (BENAZEPRIL HCL) Take 1 tablet by mouth once a day

## 2010-02-10 NOTE — Medication Information (Signed)
Summary: CCR/AMD  Anticoagulant Therapy  Managed by: Charlena Cross, RN, BSN Referring MD: Valera Castle MD PCP: Gwen Pounds MD: Graciela Husbands MD, Viviann Spare Indication 1: Pulmonary embolism (ICD-415.19) Lab Used: [facility] Site: [place] INR POC 2.7 INR RANGE 2 - 3  Dietary changes: no    Health status changes: no    Bleeding/hemorrhagic complications: no    Recent/future hospitalizations: no    Any changes in medication regimen? no    Recent/future dental: no  Any missed doses?: no       Is patient compliant with meds? yes       Allergies: 1)  ! Pcn  Anticoagulation Management History:      The patient is taking warfarin and comes in today for a routine follow up visit.  Negative risk factors for bleeding include an age less than 55 years old.  The bleeding index is 'low risk'.  Positive CHADS2 values include History of HTN.  Negative CHADS2 values include Age > 49 years old.  The start date was 06/13/2008.  Her last INR was 3.9 RATIO.  Anticoagulation responsible provider: Graciela Husbands MD, Viviann Spare.  INR POC: 2.7.    Anticoagulation Management Assessment/Plan:      The patient's current anticoagulation dose is Coumadin 7.5 mg tabs: Take asd 1 tablet by mouth once a day.  The target INR is 2 - 3.  The next INR is due 02/10/2009.  Anticoagulation instructions were given to patient.  Results were reviewed/authorized by Charlena Cross, RN, BSN.  She was notified by Charlena Cross, RN, BSN.         Prior Anticoagulation Instructions: The patient is to continue with the same dose of coumadin.  This dosage includes: coumadin 11.25 mg daily with 7.5 mg on Sun Wed Fri  Current Anticoagulation Instructions: The patient is to continue with the same dose of coumadin.  This dosage includes: coumadin 11.25 mg daily with 7.5 mf on Sun Wed Fri

## 2010-02-10 NOTE — Progress Notes (Signed)
Summary: rx  Phone Note Call from Patient Call back at Home Phone [phone] Call back at Work Phone [phone]   Caller: Patient Call For: [name] [name] Reason for Call: Talk to Nurse Summary of Call: need rx called in for her xopenex vials.  pt away from phone today.  She''ll call us. [facility] [address]  Initial call taken by: Eugene Gavia,  August 09, 2008 9:51 AM    Prescriptions: XOPENEX 1.25 MG/3ML  NEBU (LEVALBUTEROL HCL) one every 4 hours as needed  #120 x 5   Entered by:   Reynaldo Minium CMA   Authorized by:   Nyoka Cowden MD   Signed by:   Reynaldo Minium CMA on 08/09/2008   Method used:   [facility] to        [facility]  [address] [phone][facility] (retail)       [address]       [place], [place]  [id number]       Ph: [phone] or [phone]       Fax: [phone]   RxID:   [id number]

## 2010-02-10 NOTE — Assessment & Plan Note (Signed)
Summary: FOLLOW UP/ MBW   PCP:  Jonny Ruiz  Chief Complaint:  follow-up.  History of Present Illness:  5 yowf last smoked  with documented COPD with minimum asthmatic component with an FEV1 of 55%, a ratio of 53%, and a diffusing capacity 51% documented 07/25/06. On 02 24 hours per day.  6/09  weighed 271 and it was felt she was as debilitated as much by obesityas by airflow obstruction. baseline = when well = walk to mother in law's house but stops at least once because it's uphill and does at [employer]. sometimes [employer] parking. overall is noticed no change over the last year in terms of her activity tolerance and is requiring Xopenex with any exertion which means up about 3 times a day working out on a treadmill up to 15 minutes a day before stopping due to fatigue more than this he she does not know the inclination or the speed of the treadmill  10/02/07-follow up and med review. Reports fell off fishing boat into pond  1 wk ago with rib pain. Seen by ortho, placed on fish oil and glucosamine. meds correct with med list.   Has been walking, lost 8#.   October 24, 2007- follow up and reveiw of BMD, osteoporosis dx Tscore -2.8. previous intolerant to fosamax. hx of frequent steroids use. no hx of pagets, cancer or radiation tx. discuss osteop. tx options with Reclast vs Forteo. Denies chest pain, increased dyspnea, orthopnea, hemoptysis, fever, n/v/d, edema.             Prior Medication List:  FUROSEMIDE 40 MG  TABS (FUROSEMIDE) Take 1 tablet by mouth once a day COUMADIN 7.5 MG TABS (WARFARIN SODIUM) Take asd 1 tablet by mouth once a day KLOR-CON 10 10 MEQ TBCR (POTASSIUM CHLORIDE) Take 1 tablet by mouth once a day FLUOXETINE HCL 40 MG CAPS (FLUOXETINE HCL) Take 1 tablet by mouth once a day [facility] OMEPRAZOLE 20 MG TBEC (OMEPRAZOLE) 1 tablet by mouth 30 minutes before the first meal RYTHMOL SR 325 MG CP12 (PROPAFENONE HCL) two times a day  VERAMYST 27.5 MCG/SPRAY  SUSP (FLUTICASONE FUROATE) one spray two times a day SYMBICORT 160-4.5 MCG/ACT  AERO (BUDESONIDE-FORMOTEROL FUMARATE) Two puffs twice daily EXFORGE 10-320 MG  TABS (AMLODIPINE BESYLATE-VALSARTAN) 1tab by mouth once daily SPIRIVA HANDIHALER 18 MCG  CAPS (TIOTROPIUM BROMIDE MONOHYDRATE) Inhale contents of 1 capsule once a day * OXYGEN 2L continuous GLUCOSAMINE-CHONDROITIN   CAPS (GLUCOSAMINE-CHONDROIT-VIT C-MN) Take 1 capsule by mouth once a day CALCIUM CARBONATE-VITAMIN D 600-400 MG-UNIT  TABS (CALCIUM CARBONATE-VITAMIN D) Take 1 tablet by mouth two times a day XOPENEX 1.25 MG/3ML  NEBU (LEVALBUTEROL HCL) one every 6 hours as needed XOPENEX HFA 45 MCG/ACT  AERO (LEVALBUTEROL TARTRATE) inhale 2 puffs every 4 hours as needed MUCINEX DM 30-600 MG  TB12 (DEXTROMETHORPHAN-GUAIFENESIN) 1-2 every 12 hours as needed TYLENOL 325 MG  TABS (ACETAMINOPHEN) per bottle TRAMADOL HCL 50 MG  TABS (TRAMADOL HCL) Take 1 tablet by mouth every 4 hours as needed ZYRTEC ALLERGY 10 MG  TABS (CETIRIZINE HCL) one tab at bedtime as needed CVS SALINE NASAL SPRAY 0.65 %  SOLN (SALINE) 2 puffs every 4 hours as needed AFRIN NASAL SPRAY 0.05 %  SOLN (OXYMETAZOLINE HCL) 1 puff two times a day x5 days HYDRALAZINE HCL 10 MG  TABS (HYDRALAZINE HCL) 1 by mouth q 6 hrs as needed SBP > 160 FUROSEMIDE 40 MG TABS (FUROSEMIDE) 1 extra daily as needed CIPROFLOXACIN HCL 500 MG TABS (CIPROFLOXACIN HCL) 1po two  times a day   Updated Prior Medication List: FUROSEMIDE 40 MG  TABS (FUROSEMIDE) Take 1 tablet by mouth once a day COUMADIN 7.5 MG TABS (WARFARIN SODIUM) Take asd 1 tablet by mouth once a day KLOR-CON 10 10 MEQ TBCR (POTASSIUM CHLORIDE) Take 1 tablet by mouth once a day FLUOXETINE HCL 40 MG CAPS (FLUOXETINE HCL) Take 1 tablet by mouth once a day [facility]S OMEPRAZOLE 20 MG TBEC (OMEPRAZOLE) 1 tablet by mouth 30 minutes before the first meal RYTHMOL SR 325 MG CP12 (PROPAFENONE HCL) two times a day  VERAMYST 27.5 MCG/SPRAY  SUSP (FLUTICASONE FUROATE) one spray two times a day SYMBICORT 160-4.5 MCG/ACT  AERO (BUDESONIDE-FORMOTEROL FUMARATE) Two puffs twice daily EXFORGE 10-320 MG  TABS (AMLODIPINE BESYLATE-VALSARTAN) 1tab by mouth once daily SPIRIVA HANDIHALER 18 MCG  CAPS (TIOTROPIUM BROMIDE MONOHYDRATE) Inhale contents of 1 capsule once a day * OXYGEN 2L continuous GLUCOSAMINE-CHONDROITIN   CAPS (GLUCOSAMINE-CHONDROIT-VIT C-MN) Take 1 capsule by mouth once a day CALCIUM CARBONATE-VITAMIN D 600-400 MG-UNIT  TABS (CALCIUM CARBONATE-VITAMIN D) Take 1 tablet by mouth two times a day XOPENEX 1.25 MG/3ML  NEBU (LEVALBUTEROL HCL) one every 6 hours as needed XOPENEX HFA 45 MCG/ACT  AERO (LEVALBUTEROL TARTRATE) inhale 2 puffs every 4 hours as needed MUCINEX DM 30-600 MG  TB12 (DEXTROMETHORPHAN-GUAIFENESIN) 1-2 every 12 hours as needed TYLENOL 325 MG  TABS (ACETAMINOPHEN) per bottle TRAMADOL HCL 50 MG  TABS (TRAMADOL HCL) Take 1 tablet by mouth every 4 hours as needed ZYRTEC ALLERGY 10 MG  TABS (CETIRIZINE HCL) one tab at bedtime as needed CVS SALINE NASAL SPRAY 0.65 %  SOLN (SALINE) 2 puffs every 4 hours as needed AFRIN NASAL SPRAY 0.05 %  SOLN (OXYMETAZOLINE HCL) 1 puff two times a day x5 days HYDRALAZINE HCL 10 MG  TABS (HYDRALAZINE HCL) 1 by mouth q 6 hrs as needed SBP > 160 FUROSEMIDE 40 MG TABS (FUROSEMIDE) 1 extra daily as needed CIPROFLOXACIN HCL 500 MG TABS (CIPROFLOXACIN HCL) 1po two times a day FORTEO 600 MCG/2.4ML SOLN (TERIPARATIDE (RECOMBINANT)) 20 micrograms subcutaneously once daily  Current Allergies (reviewed today): ! PCN  Past Medical History:    Reviewed history from 10/02/2007 and no changes required:       CHRONIC RHINITIS (ICD-472.0)       EDEMA (ICD-782.3)       OBSTRUCTIVE CHRONIC BRONCHITIS WITH EXACERBATION (ICD-491.21)       ACUTE AND CHRONIC RESPIRATORY FAILURE (ICD-518.84)       MORBID OBESITY (ICD-278.01)        DEEP VENOUS THROMBOPHLEBITIS, HX OF (ICD-V12.52)       COPD (ICD-496)       DEPRESSION (ICD-311)       OSTEOPOROSIS (ICD-733.00) last BMD 4/08 -2.4, intolerant of bisphosph.        HYPERTENSION (ICD-401.9)       Atrial fibrillation - paroxysmal       Anticoagulation therapy       Pulmonary embolism, hx of       neg stress myoview 10/07       Anxiety       glucose intolerance - on steroids       Hyperlipidemia              flu shot 10/02/2007   Family History:    Reviewed history from 07/07/2007 and no changes required:       asthma and brothers and son.         Heart disease in mother.  Throat cancer in brother.  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   Risk Factors: Tobacco use:  quit    Year quit:  1998    Pack-years:  71yrs, 1ppd Alcohol use:  no  Colonoscopy History:    Date of Last Colonoscopy:  02/13/2004  Mammogram History:    Date of Last Mammogram:  01/11/1998  PAP Smear History:    Date of Last PAP Smear:  01/11/1998   Review of Systems      See HPI   Vital Signs:  Patient Profile:   60 Years Old Female Height:     66 inches Weight:      284.13 pounds O2 Sat:      97 % O2 treatment:     2L Temp:     99 degrees F oral Pulse rate:   66 / minute BP sitting:   132 / 68  (right arm) Cuff size:   large  Vitals Entered By: Renold Genta RCP, LPN (October 24, 2007 10:12 AM)             Is Patient Diabetic? No Comments Follow-up after Bone Density Scan Medications reviewed with patient Renold Genta RCP, LPN  October 24, 2007 10:16 AM      Physical Exam  GENERAL:  A/Ox3; pleasant & cooperative.NAD HEENT:  [place]/AT, EOM-wnl, PERRLA, EACs-clear, TMs-wnl, NOSE-clear, THROAT-clear & wnl.  NECK:  Supple w/ fair ROM; no JVD; normal carotid impulses w/o bruits; no thyromegaly or nodules palpated; no lymphadenopathy. CHEST:  Clear to P & A; w/o, wheezes/ rales/ or rhonchi. HEART:  RRR, no m/r/g  heard ABDOMEN:  Soft & nt; nml bowel sounds; no organomegaly or masses detected. EXT: Warm bilat,  no calf pain, edema, clubbing, pulses intact Skin: no rash/lesion       Impression & Recommendations:  Problem # 1:  OSTEOPOROSIS (ICD-733.00) Suggest we look at [facility] coverage first, check vitamin d level, continue on wt bearing exercise/walking, and calcium with vitam d  if not coverage for forteo, look at reclast.  Please contact office for sooner follow up if symptoms do not improve or worsen  Orders: T-Vitamin D (25-Hydroxy) ([id number]) Est. Patient Level III (09811)   Medications Added to Medication List This Visit: 1)  Forteo 600 Mcg/2.41ml Soln (Teriparatide (recombinant)) .... 20 micrograms subcutaneously once daily  Complete Medication List: 1)  Furosemide 40 Mg Tabs (Furosemide) .... Take 1 tablet by mouth once a day 2)  Coumadin 7.5 Mg Tabs (Warfarin sodium) .... Take asd 1 tablet by mouth once a day 3)  Klor-con 10 10 Meq Tbcr (Potassium chloride) .... Take 1 tablet by mouth once a day 4)  Fluoxetine Hcl 40 Mg Caps (Fluoxetine hcl) .... Take 1 tablet by mouth once a day 5)  [facility]vs Omeprazole 20 Mg Tbec (Omeprazole) .Marland Kitchen.. 1 tablet by mouth 30 minutes before the first meal 6)  Rythmol Sr 325 Mg Cp12 (Propafenone hcl) .... Two times a day 7)  Veramyst 27.5 Mcg/spray Susp (Fluticasone furoate) .... One spray two times a day 8)  Symbicort 160-4.5 Mcg/act Aero (Budesonide-formoterol fumarate) .... Two puffs twice daily 9)  Exforge 10-320 Mg Tabs (Amlodipine besylate-valsartan) .Marland Kitchen.. 1tab  by mouth once daily 10)  Spiriva Handihaler 18 Mcg Caps (Tiotropium bromide monohydrate) .... Inhale contents of 1 capsule once a day 11)  Oxygen 2l  .... Continuous  12)  Glucosamine-chondroitin Caps (Glucosamine-chondroit-vit c-mn) .... Take 1 capsule by mouth once a day 13)  Calcium Carbonate-vitamin D 600-400 Mg-unit Tabs (Calcium carbonate-vitamin d) .... Take 1 tablet by mouth two times a day 14)  Xopenex 1.25 Mg/63ml Nebu (Levalbuterol hcl) .... One every 6 hours as needed 15)  Xopenex Hfa 45 Mcg/act Aero (Levalbuterol tartrate) .... Inhale 2 puffs every 4 hours as needed 16)  Mucinex Dm 30-600 Mg Tb12 (Dextromethorphan-guaifenesin) .Marland Kitchen.. 1-2 every 12 hours as needed 17)  Tylenol 325 Mg Tabs (Acetaminophen) .... Per bottle 18)  Tramadol Hcl 50 Mg Tabs (Tramadol hcl) .... Take 1 tablet by mouth every 4 hours as needed 19)  Zyrtec Allergy 10 Mg Tabs (Cetirizine hcl) .... One tab at bedtime as needed 20)  Cvs Saline Nasal Spray 0.65 % Soln (Saline) .... 2 puffs every 4 hours as needed 21)  Afrin Nasal Spray 0.05 % Soln (Oxymetazoline hcl) .Marland Kitchen.. 1 puff two times a day x5 days 22)  Hydralazine Hcl 10 Mg Tabs (Hydralazine hcl) .Marland Kitchen.. 1 by mouth q 6 hrs as needed sbp > 160 23)  Furosemide 40 Mg Tabs (Furosemide) .Marland Kitchen.. 1 extra daily as needed 24)  Ciprofloxacin Hcl 500 Mg Tabs (Ciprofloxacin hcl) .Marland Kitchen.. 1po two times a day 25)  Forteo 600 Mcg/2.77ml Soln (Teriparatide (recombinant)) .... 20 micrograms subcutaneously once daily  Other Orders: T-Vitamin D (25-Hydroxy) ([id number]) Est. Patient Level III (27253)   Patient Instructions: 1)  Calcium with vitamin d two times a day  2)  will call with vitamin d level 3)  we wait to check coverage on Forteo.  4)  Please contact office for sooner follow up if symptoms do not improve or worsen    Prescriptions: FORTEO 600 MCG/2.4ML SOLN (TERIPARATIDE (RECOMBINANT)) 20 micrograms subcutaneously once daily  #1 pen x 11   Entered and Authorized by:   Rubye Oaks NP   Signed by:   Patsie Mccardle NP on 10/24/2007   Method used:   Faxed to ...       [facility]* (mail-order)             ,          Ph: [phone]        Fax: [phone]   RxID:   [id number]  ]

## 2010-02-10 NOTE — Progress Notes (Signed)
Summary: Results  Phone Note Call from Patient Call back at Home Phone [phone]   Summary of Call: Patient is requesting a call back, she would like to know results of bone density test again.  Initial call taken by: Lamar Sprinkles, CMA,  November 06, 2009 11:58 AM  Follow-up for Phone Call        left message on machine for pt to return my call. Margaret Pyle, CMA  November 06, 2009 3:21 PM  Pt called to check the status of  Prolia verification. Pt advised that it it being worked on By [employer] Follow-up by: Margaret Pyle, CMA,  November 07, 2009 8:24 AM

## 2010-02-10 NOTE — Assessment & Plan Note (Signed)
Summary: rov 6 wks////kp   Visit Type:  extended fu PCP:  John  Chief Complaint:  6 wk followup.  Pt c/o tremors in her left hand x 1 wk.  Starts in her forearn and radiates down to her fingers "tingling sensation".  She also c/o increased sob today and she relates this to the humidity. She also wants to discuss the change in her bp meds..  History of Present Illness: 7 yowf last smoked  with documented COPD with minimum asthmatic component with an FEV1 of 55%, a ratio of 53%, and a diffusing capacity 51% documented 07/25/06. On 02 24 hours per day.  2008  weighed 271 and it was felt she was as debilitated as much by obesityas by airflow obstruction. baseline = when well = walk to mother in law's house but stops at least once because it's uphill and does at [employer]. sometimes [employer] parking. overall is noticed no change over the last year in terms of her activity tolerance and is requiring Xopenex with any exertion which means up about 3 times a day working out on a treadmill up to 15 minutes a day before stopping due to fatigue more than this he she does not know the inclination or the speed of the treadmill   Pt denies any significant sore throat, nasal congestion or excess secretions, fever, chills, sweats, unintended wt loss, pleuritic or exertional cp, orthopnea pnd or leg swelling.  Pt also denies any obvious fluctuation in symptoms with weather or environmental change or other alleviating or aggravating factors.           Prior Medication List:  LASIX 40 MG TABS (FUROSEMIDE) Take 1 tablet by mouth once a day COUMADIN 7.5 MG TABS (WARFARIN SODIUM) Take 1 tablet by mouth once a day, 11mg  on Mondays KLOR-CON 10 10 MEQ TBCR (POTASSIUM CHLORIDE) Take 1 tablet by mouth once a day FLUOXETINE HCL 40 MG CAPS (FLUOXETINE HCL) Take 1 tablet by mouth once a day OMEPRAZOLE 20 MG  CPDR (OMEPRAZOLE) Take 1 capsule by mouth once a day RYTHMOL SR 325 MG CP12 (PROPAFENONE HCL) two times a day  VERAMYST 27.5 MCG/SPRAY  SUSP (FLUTICASONE FUROATE) one spray two times a day SYMBICORT 160-4.5 MCG/ACT  AERO (BUDESONIDE-FORMOTEROL FUMARATE) Two puffs twice daily EXFORGE 10-320 MG  TABS (AMLODIPINE BESYLATE-VALSARTAN) 1po once daily SPIRIVA HANDIHALER 18 MCG  CAPS (TIOTROPIUM BROMIDE MONOHYDRATE) Inhale contents of 1 capsule once a day * OXYGEN 2L continuous XOPENEX 1.25 MG/3ML  NEBU (LEVALBUTEROL HCL) one every 6 hours as needed XOPENEX HFA 45 MCG/ACT  AERO (LEVALBUTEROL TARTRATE) inhale 2 puffs every 4 hours as needed TYLENOL 325 MG  TABS (ACETAMINOPHEN) per bottle MUCINEX DM 30-600 MG  TB12 (DEXTROMETHORPHAN-GUAIFENESIN) 1-2 every 12 hours as needed ZYRTEC ALLERGY 10 MG  TABS (CETIRIZINE HCL) one tab at bedtime as needed [facility]S SALINE NASAL SPRAY 0.65 %  SOLN (SALINE) 2 puffs every 4 hours as needed AFRIN NASAL SPRAY 0.05 %  SOLN (OXYMETAZOLINE HCL) 1 puff two times a day x5 days FUROSEMIDE 20 MG  TABS (FUROSEMIDE) 1 extra daily as needed   Current Allergies (reviewed today): ! PCN  Past Medical History:    Reviewed history from 06/19/2007 and no changes required:       CHRONIC RHINITIS (ICD-472.0)       EDEMA (ICD-782.3)       OBSTRUCTIVE CHRONIC BRONCHITIS WITH EXACERBATION (ICD-491.21)       ACUTE AND CHRONIC RESPIRATORY FAILURE (ICD-518.84)       MORBID  OBESITY (ICD-278.01)       DEEP VENOUS THROMBOPHLEBITIS, HX OF (ICD-V12.52)       COPD (ICD-496)       DEPRESSION (ICD-311)       OSTEOPOROSIS (ICD-733.00)       HYPERTENSION (ICD-401.9)       Atrial fibrillation - paroxysmal       Anticoagulation therapy       Pulmonary embolism, hx of       neg stress myoview 10/07       Anxiety       glucose intolerance - on steroids       Hyperlipidemia   Family History:    Reviewed history from 01/19/2007 and no changes required:       asthma and brothers and son.         Heart disease in mother.         Throat cancer in brother.  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     Vital Signs:  Patient Profile:   60 Years Old Female Height:     66 inches Weight:      282.50 pounds O2 Sat:      99 % O2 treatment:    2 liters pulsed Temp:     98.2 degrees F oral Pulse rate:   57 / minute BP sitting:   124 / 78  (left arm)  Vitals Entered By: Vernie Murders (July 07, 2007 9:45 AM)                 Physical Exam  wt 271 to 282  In  general she is a massively obese  white female oxygen dependent but still able to get up on the exam table without help. HEENT mild turbinate edema.  Oropharynx no thrush or excess pnd or cobblestoning.  No JVD or cervical adenopathy. Mild accessory muscle hypertrophy. Trachea midline, nl thryroid. Chest was hyperinflated by percussion with diminished breath sounds and moderate increased exp time without wheeze. Hoover sign positive at mid inspiration. Regular rate and rhythm without murmur gallop or rub or increase P2.  Abd: no hsm, nl excursion. Ext warm without C,C or E.        Impression & Recommendations:  Problem # 1:  COPD (ICD-496) I spent extra time with the patient today explaining optimal mdi  technique.  This improved from  50-75%   I continue to feel that the most reversible aspect of her health is her obesity and yet she has gained another 10 pounds since we last had this conversation a year ago.  She shows exceptionally poor insight into exercise physiology, although I've given her specific instructions a year ago  and again today regarding the need to do some form of exercise for 30 minutes, even if it is at a snail's pace,  and then the build up  from there after the 30 minute duration has been maintained consistently.  This should be on oxygen, and if necessary with using Xopenex before she exercises if she feels it benefits her.   Each maintenance medication was reviewed in detail including most importantly the difference between maintenance and as needed and under what circumstances the prns are to be used. This was done in the context of a medication calendar review which provided the patient with a user-friendly unambiguous mechanism for medication administration and reconciliation. It is critical that this be shown to every doctor he sees for modification during the office visit if necessary so the patient can use it as a working document.   Her updated medication list for this problem includes:    Symbicort 160-4.5 Mcg/act Aero (Budesonide-formoterol fumarate) .Marland Kitchen..Marland Kitchen Two puffs twice daily    Spiriva Handihaler 18 Mcg Caps (Tiotropium bromide monohydrate) ..... Inhale contents of 1 capsule once a day    Xopenex 1.25 Mg/54ml Nebu (Levalbuterol hcl) ..... One every 6 hours as needed    Xopenex Hfa 45 Mcg/act Aero (Levalbuterol tartrate) ..... Inhale 2 puffs every 4 hours as needed  Orders: Est. Patient Level IV (04540)   Complete Medication List: 1)  Lasix 40 Mg Tabs (Furosemide) .... Take 1 tablet by mouth once a day 2)  Coumadin 7.5 Mg Tabs (Warfarin sodium) .... Take 1 tablet by mouth once a day, 11mg  on mondays 3)  Klor-con 10 10 Meq Tbcr (Potassium chloride) .... Take 1 tablet by mouth once a day  4)  Fluoxetine Hcl 40 Mg Caps (Fluoxetine hcl) .... Take 1 tablet by mouth once a day 5)  Omeprazole 20 Mg Cpdr (Omeprazole) .... Take 1 capsule by mouth once a day 6)  Rythmol Sr 325 Mg Cp12 (Propafenone hcl) .... Two times a day 7)  Veramyst 27.5 Mcg/spray Susp (Fluticasone furoate) .... One spray two times a day 8)  Symbicort 160-4.5 Mcg/act Aero (Budesonide-formoterol fumarate) .... Two puffs twice daily 9)  Exforge 10-320 Mg Tabs (Amlodipine besylate-valsartan) .Marland Kitchen.. 1po once daily 10)  Spiriva Handihaler 18 Mcg Caps (Tiotropium bromide monohydrate) .... Inhale contents of 1 capsule once a day 11)  Oxygen 2l  .... Continuous 12)  Xopenex 1.25 Mg/66ml Nebu (Levalbuterol hcl) .... One every 6 hours as needed 13)  Xopenex Hfa 45 Mcg/act Aero (Levalbuterol tartrate) .... Inhale 2 puffs every 4 hours as needed 14)  Tylenol 325 Mg Tabs (Acetaminophen) .... Per bottle 15)  Mucinex Dm 30-600 Mg Tb12 (Dextromethorphan-guaifenesin) .Marland Kitchen.. 1-2 every 12 hours as needed 16)  Zyrtec Allergy 10 Mg Tabs (Cetirizine hcl) .... One tab at bedtime as needed 17)  Cvs Saline Nasal Spray 0.65 % Soln (Saline) .... 2 puffs every 4 hours as needed 18)  Afrin Nasal Spray 0.05 % Soln (Oxymetazoline hcl) .Marland Kitchen.. 1 puff two times a day x5 days 19)  Furosemide 20 Mg Tabs (Furosemide) .Marland Kitchen.. 1 extra daily as needed in all all  Patient Instructions: 1)  Weight control is simply a matter of calorie balance which needs to be tilted in your favor by eating less and exercising more.  To get the most out of exercise, you need to be continuously aware that you are short of breath, but never out of breath, for 30 minutes daily. As you improve, it will actually be easier for you to do the same amount in  30 minutes so always push to the level where you are short of breath.  If this does not result in gradual weight  reduction,  I recommend  a nutritionist for a food diary.  2)  Please schedule a follow-up appointment in 3 months for new med calendar, sooner if needed. 3)  Target wt is 270 (282 now) 4)  See calendar for specific medication instructions and bring it back for each and every office visit for every healthcare provider you see.  Without it,  you may not receive the best quality medical care that we feel you deserve.    ]

## 2010-02-10 NOTE — Assessment & Plan Note (Signed)
Summary: Pulmonay/ ext f/u ov with walking sats ok on 2lpm   Primary Provider/Referring Provider:  Corwin Levins MD  CC:  2 month followup.  Pt c/o "not getting enough air"- worse over the past few wks.  Gets out of breath with "any movement".  She denies any cough or wheeze.Marland Kitchen  History of Present Illness: 38  yowf last smoked 2003 with   COPD with minimum asthmatic component with an FEV1 of 55%, a ratio of 53%, and a diffusing capacity 51% documented 07/25/06.  On 02 24 hours per day at 2lpm  6/09  weighed 271 and it was felt she was as debilitated as much by obesity as by airflow obstruction. baseline = when well = walk to mother in law's house but stops at least once because it's uphill and frequently requiring rescue rx.  March 01, 2008--post hospitalization follow up. Admitted 02/23/08 for post op respiratory difficulties. she underwent  left Wrist surgery, post op dyspnea, cough/wheezing. Admitted w/ aggressive resp toilet and IV steroids. Discharged. 02/27/08 improved.    May 01, 2008--Presents for follow up and med review and new  med calendar -  Since last visit doing much better.   June 20, 2008 ov post hosp for [facility] req d/c cardioversion but no change in rx,  walking 30 min daily on 02 c/o increaese cough day = night, dry.  wt = 265   September 16, 2009 2 month followup.  Pt c/o "not getting enough air"- worse over the past few wks.  Gets out of breath with "any movement".   new L sided cp x 3 weeks similar to previous clots.  Pain comes and goes no pattern not pleuritic, lasts up to several days at a time, post left chest but  no increase cough or worse with cough.  Pt denies any significant sore throat, dysphagia, itching, sneezing,  nasal congestion or excess secretions,  fever, chills, sweats, unintended wt loss, pleuritic or exertional cp, hempoptysis, change in activity tolerance  orthopnea pnd or leg swelling Pt also denies any obvious fluctuation in symptoms with weather or  environmental change or other alleviating or aggravating factors.         Current Medications (verified): 1)  Furosemide 40 Mg  Tabs (Furosemide) .... Take 1 Tablet By Mouth Once A Day 2)  Coumadin 7.5 Mg Tabs (Warfarin Sodium) .... Take Asd 1 Tablet By Mouth Once A Day 3)  Klor-Con 10 10 Meq Tbcr (Potassium Chloride) .... Take 1 Tablet By Mouth Two Times A Day 4)  Fluoxetine Hcl 40 Mg Caps (Fluoxetine Hcl) .Marland Kitchen.. 1 Once Daily 5)  Omeprazole 20 Mg Cpdr (Omeprazole) .Marland Kitchen.. 1 By Mouth 30 Min Before 1st Meal 6)  Propafenone Hcl 325 Mg Xr12h-Cap (Propafenone Hcl) .Marland Kitchen.. 1 Tab Daily 7)  Nasonex 50 Mcg/act Susp (Mometasone Furoate) .... 2 Puffs Each Nostril Two Times A Day 8)  Symbicort 160-4.5 Mcg/act  Aero (Budesonide-Formoterol Fumarate) .... Two Puffs Twice Daily 9)  Exforge 10-320 Mg  Tabs (Amlodipine Besylate-Valsartan) .Marland Kitchen.. 1tab By Mouth Once Daily 10)  Spiriva Handihaler 18 Mcg  Caps (Tiotropium Bromide Monohydrate) .... Inhale Contents of 1 Capsule Once A Day 11)  Oxygen 2l/min .... Continuous 12)  Glucosamine-Chondroitin   Caps (Glucosamine-Chondroit-Vit C-Mn) .... Take 1 Capsule By Mouth Once A Day 13)  Calcium Carbonate-Vitamin D 600-400 Mg-Unit  Tabs (Calcium Carbonate-Vitamin D) .... Take 1 Tablet By Mouth Two Times A Day 14)  Vitamin D 1000 Unit Tabs (Cholecalciferol) .... Take 1 Tablet  By Mouth Once A Day 15)  Xopenex 1.25 Mg/20ml  Nebu (Levalbuterol Hcl) .... One Every 4 Hours As Needed 16)  Xopenex Hfa 45 Mcg/act  Aero (Levalbuterol Tartrate) .... Inhale 2 Puffs Every 4 Hours As Needed 17)  Tylenol Arthritis Pain 650 Mg Cr-Tabs (Acetaminophen) .... Per Bottle 18)  Mucinex Dm 30-600 Mg Xr12h-Tab (Dextromethorphan-Guaifenesin) .Marland Kitchen.. 1 Every 12 Hours As Needed W/ Flutter 19)  Tramadol Hcl 50 Mg  Tabs (Tramadol Hcl) .... Take 1 Tablet By Mouth Every 4 Hours As  Needed For Cough 20)  Chlor-Trimeton 4 Mg Tabs (Chlorpheniramine Maleate) .Marland Kitchen.. 1 Every 4 Hours As Needed 21)  Cvs Saline Nasal  Spray 0.65 %  Soln (Saline) .... 2 Puffs Every 4 Hours As Needed 22)  Afrin Nasal Spray 0.05 % Soln (Oxymetazoline Hcl) .Marland Kitchen.. 1 Puff Two Times A Day X5days - Before Nasonex 23)  Furosemide 40 Mg Tabs (Furosemide) .Marland Kitchen.. 1 Extra Daily As Needed 24)  Flexeril 5 Mg Tabs (Cyclobenzaprine Hcl) .Marland Kitchen.. 1 By Mouth Three Times A Day As Needed  Allergies (verified): 1)  ! Pcn 2)  * Cymbalta  Past History:  Past Medical History: CHRONIC RHINITIS (ICD-472.0) EDEMA (ICD-782.3) ACUTE AND CHRONIC RESPIRATORY FAILURE (ICD-518.84) MORBID OBESITY (ICD-278.01)     - Target wt  =   179  for BMI < 30 peak wt 282     - Referred back to nutrition December 18, 2007  DEEP VENOUS THROMBOPHLEBITIS, HX OF (ICD-V12.52) COPD (ICD-496)........................................................Marland KitchenWert     - PFTs  07/25/06 FEV1 55% ratio 53, DLC0 51% DEPRESSION (ICD-311) OSTEOPOROSIS (ICD-733.00) last BMD 4/08 -2.4, intolerant of bisphosphonates  HYPERTENSION (ICD-401.9) Atrial fibrillation - paroxysmal Anticoagulation therapy Pulmonary embolism, hx of neg stress myoview 10/07 Anxiety glucose intolerance - on steroids Hyperlipidemia Complex medical regimen    - Med calendar done 05/01/2008  Vital Signs:  Patient profile:   60 year old female Weight:      277 pounds O2 Sat:      95 % on 2 L/min Temp:     97.7 degrees F oral Pulse rate:   77 / minute BP sitting:   150 / 82  (left arm) Cuff size:   large  Vitals Entered By: Vernie Murders (September 16, 2009 9:29 AM)  O2 Flow:  2 L/min  Serial Vital Signs/Assessments:  Comments: Ambulatory Pulse Oximetry  Resting; HR_82____    02 Sat_96% 2 LITERS____  Lap1 (185 feet)   HR_103____   02 Sat__94% 2 LITERS___ Lap2 (185 feet)   HR_105____   02 Sat__90% 2 LITERS___    Lap3 (185 feet)   HR__111___   02 Sat__90% 2 LITERS___  _X__Test Completed without Difficulty ___Test Stopped due to:  Carver Fila  September 16, 2009 9:58 AM    By: Carver Fila    Physical Exam  Additional Exam:  Ambulatory healthy appearing in no acute distress.   280 February 07, 2008  >259 May 01, 2008>   267 April 18, 2009 >264 July 17, 2009 > 277 .September 16, 2009  HEENT: nl dentition, turbinates, and orophanx. Nl external ear canals without cough reflex Neck without JVD/Nodes/TM Lungs coarse BS w/ few rhonchi RRR no m/r/g no edema Abd soft and benign with limited excursion in the supine position.  Ext warm without calf tenderness, cyanosis clubbing or edema Skin warm and dry without lesions     CXR  Procedure date:  09/16/2009  Findings:      No acute abnormality.  Impression & Recommendations:  Problem # 1:  COPD (ICD-496) GOLD III and rel well compensated despite weight gain.  Her updated medication list for this problem includes:    Symbicort 160-4.5 Mcg/act Aero (Budesonide-formoterol fumarate) .Marland Kitchen..Marland Kitchen Two puffs twice daily    Spiriva Handihaler 18 Mcg Caps (Tiotropium bromide monohydrate) ..... Inhale contents of 1 capsule once a day    Xopenex 1.25 Mg/20ml Nebu (Levalbuterol hcl) ..... One every 4 hours as needed    Xopenex Hfa 45 Mcg/act Aero (Levalbuterol tartrate) ..... Inhale 2 puffs every 4 hours as needed   Each maintenance medication was reviewed in detail including most importantly the difference between maintenance and as needed and under what circumstances the prns are to be used. This was done in the context of a medication calendar review which provided the patient with a user-friendly unambiguous mechanism for medication administration and reconciliation and provides an action plan for all active problems. It is critical that this be shown to every doctor  for modification during the office visit if necessary so the patient can use it as a working document.     Problem # 2:  CHEST PAIN (ICD-786.50) PE very unlikely as already on coumadin, comes and goes x weeks, positional and not pleuritic.  so   probably  musculoskeletal,  reviewed rx with tramadol.    Problem # 3:  ACUTE AND CHRONIC RESPIRATORY FAILURE (ICD-518.84) Relatively well compensated on present rx as per ex study today  Medications Added to Medication List This Visit: 1)  Fluoxetine Hcl 40 Mg Caps (Fluoxetine hcl) .Marland Kitchen.. 1 once daily  Other Orders: Pulse Oximetry, Ambulatory (09811) T-2 View CXR (71020TC) Est. Patient Level IV (91478)  Patient Instructions: 1)  See calendar for specific medication instructions and bring it back for each and every office visit for every healthcare provider you see.  Without it,  you may not receive the best quality medical care that we feel you deserve.  2)  Return to office in 3 months, sooner if needed

## 2010-02-10 NOTE — Assessment & Plan Note (Signed)
Summary: PER [name] CRAMPS  STC   Vital Signs:  Patient profile:   60 year old female Height:      64 inches Weight:      261.75 pounds BMI:     45.09 O2 Sat:      98 % on Room air Temp:     97.1 degrees F oral Pulse rate:   92 / minute BP supine:   118 / 78  (left arm) BP sitting:   118 / 74  (left arm) BP standing:   114 / 76  (left arm) Cuff size:   large  Vitals Entered By: Zella Ball Ewing CMA (AAMA) (July 29, 2009 2:00 PM)  O2 Flow:  Room air  Preventive Care Screening     declkens dxa for now  CC: Cramps all over/RE   Primary Care Provider:  Corwin Levins MD  CC:  Cramps all over/RE.  History of Present Illness: here feeling worse for 2 to 3 days, with cramps and drawing to the arms, thumbs, calves , toes repeated episodes;  had 2 recent antibiotics;  does not spend time outside, and does not think the overtakes the lasix - no extra;  does drink fluids during the day, lasix does seem to work but urine has been more concentrated lately.  Denies orthostasis.Pt denies CP, sob, doe, wheezing, orthopnea, pnd, worsening LE edema, palps, dizziness or syncope  Pt denies new neuro symptoms such as headache, facial or extremity weakness No fever, wt loss, night sweats, loss of appetite or other constitutional symptoms   Problems Prior to Update: 1)  Rhabdomyolysis  (ICD-728.88) 2)  [name] in Limb  (ICD-729.82) 3)  Fracture, Rib, Right  (ICD-807.00) 4)  Sinusitis- Acute-nos  (ICD-461.9) 5)  Preventive Health Care  (ICD-V70.0) 6)  Premature Ventricular Contractions  (ICD-427.69) 7)  Sinusitis- Acute-nos  (ICD-461.9) 8)  Uti  (ICD-599.0) 9)  Dysuria  (ICD-788.1) 10)  Atrial Flutter  (ICD-427.32) 11)  Cough  (ICD-786.2) 12)  Ankle Pain  (ICD-719.47) 13)  Dependent Edema, Right Leg  (ICD-782.3) 14)  Pneumonia Organism Nos  (ICD-486) 15)  Preventive Health Care  (ICD-V70.0) 16)  Conjunctivitis  (ICD-372.30) 17)  Vitamin D Deficiency  (ICD-268.9) 18)  Preventive Health Care   (ICD-V70.0) 19)  Hyperlipidemia  (ICD-272.4) 20)  Glucose Intolerance  (ICD-271.3) 21)  Anxiety  (ICD-300.00) 22)  Pulmonary Embolism, Hx of  (ICD-V12.51) 23)  Anticoagulation Therapy  (ICD-V58.61) 24)  Atrial Fibrillation  (ICD-427.31) 25)  Chronic Rhinitis  (ICD-472.0) 26)  Edema  (ICD-782.3) 27)  Obstructive Chronic Bronchitis With Exacerbation  (ICD-491.21) 28)  Acute and Chronic Respiratory Failure  (ICD-518.84) 29)  Morbid Obesity  (ICD-278.01) 30)  Deep Venous Thrombophlebitis, Hx of  (ICD-V12.52) 31)  COPD  (ICD-496) 32)  Depression  (ICD-311) 33)  Osteoporosis  (ICD-733.00) 34)  Hypertension  (ICD-401.9)  Medications Prior to Update: 1)  Furosemide 40 Mg  Tabs (Furosemide) .... Take 1 Tablet By Mouth Once A Day 2)  Coumadin 7.5 Mg Tabs (Warfarin Sodium) .... Take Asd 1 Tablet By Mouth Once A Day 3)  Klor-Con 10 10 Meq Tbcr (Potassium Chloride) .... Take 1 Tablet By Mouth Two Times A Day 4)  Cymbalta 30 Mg Cpep (Duloxetine Hcl) .... Take 1 Capsule By Mouth Once A Day 5)  Omeprazole 20 Mg Cpdr (Omeprazole) .Marland Kitchen.. 1 By Mouth 30 Min Before 1st Meal 6)  Propafenone Hcl 325 Mg Xr12h-Cap (Propafenone Hcl) .Marland Kitchen.. 1 Tab Daily 7)  Nasonex 50 Mcg/act Susp (Mometasone Furoate) .Marland KitchenMarland KitchenMarland Kitchen  2 Puffs Each Nostril Two Times A Day 8)  Symbicort 160-4.5 Mcg/act  Aero (Budesonide-Formoterol Fumarate) .... Two Puffs Twice Daily 9)  Exforge 10-320 Mg  Tabs (Amlodipine Besylate-Valsartan) .Marland Kitchen.. 1tab By Mouth Once Daily 10)  Spiriva Handihaler 18 Mcg  Caps (Tiotropium Bromide Monohydrate) .... Inhale Contents of 1 Capsule Once A Day 11)  Oxygen 2l/min .... Continuous 12)  Glucosamine-Chondroitin   Caps (Glucosamine-Chondroit-Vit C-Mn) .... Take 1 Capsule By Mouth Once A Day 13)  Calcium Carbonate-Vitamin D 600-400 Mg-Unit  Tabs (Calcium Carbonate-Vitamin D) .... Take 1 Tablet By Mouth Two Times A Day 14)  Vitamin D 1000 Unit Tabs (Cholecalciferol) .... Take 1 Tablet By Mouth Once A Day 15)  Xopenex 1.25 Mg/71ml   Nebu (Levalbuterol Hcl) .... One Every 4 Hours As Needed 16)  Xopenex Hfa 45 Mcg/act  Aero (Levalbuterol Tartrate) .... Inhale 2 Puffs Every 4 Hours As Needed 17)  Tylenol Arthritis Pain 650 Mg Cr-Tabs (Acetaminophen) .... Per Bottle 18)  Mucinex Dm 30-600 Mg Xr12h-Tab (Dextromethorphan-Guaifenesin) .Marland Kitchen.. 1 Every 12 Hours As Needed W/ Flutter 19)  Tramadol Hcl 50 Mg  Tabs (Tramadol Hcl) .... Take 1 Tablet By Mouth Every 4 Hours As  Needed For Cough 20)  Chlor-Trimeton 4 Mg Tabs (Chlorpheniramine Maleate) .Marland Kitchen.. 1 Every 4 Hours As Needed 21)  Cvs Saline Nasal Spray 0.65 %  Soln (Saline) .... 2 Puffs Every 4 Hours As Needed 22)  Afrin Nasal Spray 0.05 % Soln (Oxymetazoline Hcl) .Marland Kitchen.. 1 Puff Two Times A Day X5days - Before Nasonex 23)  Furosemide 40 Mg Tabs (Furosemide) .Marland Kitchen.. 1 Extra Daily As Needed 24)  Avelox 400 Mg Tabs (Moxifloxacin Hcl) .Marland Kitchen.. 1 By Mouth Once Daily 25)  Prednisone 10 Mg Tabs (Prednisone) .... 4 Tabs For 2 Days, Then 3 Tabs For 2 Days, 2 Tabs For 2 Days, Then 1 Tab For 2 Days, Then Stop  Current Medications (verified): 1)  Furosemide 40 Mg  Tabs (Furosemide) .... Take 1 Tablet By Mouth Once A Day 2)  Coumadin 7.5 Mg Tabs (Warfarin Sodium) .... Take Asd 1 Tablet By Mouth Once A Day 3)  Klor-Con 10 10 Meq Tbcr (Potassium Chloride) .... Take 1 Tablet By Mouth Two Times A Day 4)  Cymbalta 60 Mg Cpep (Duloxetine Hcl) .Marland Kitchen.. 1 By Mouth Once Daily 5)  Omeprazole 20 Mg Cpdr (Omeprazole) .Marland Kitchen.. 1 By Mouth 30 Min Before 1st Meal 6)  Propafenone Hcl 325 Mg Xr12h-Cap (Propafenone Hcl) .Marland Kitchen.. 1 Tab Daily 7)  Nasonex 50 Mcg/act Susp (Mometasone Furoate) .... 2 Puffs Each Nostril Two Times A Day 8)  Symbicort 160-4.5 Mcg/act  Aero (Budesonide-Formoterol Fumarate) .... Two Puffs Twice Daily 9)  Exforge 10-320 Mg  Tabs (Amlodipine Besylate-Valsartan) .Marland Kitchen.. 1tab By Mouth Once Daily 10)  Spiriva Handihaler 18 Mcg  Caps (Tiotropium Bromide Monohydrate) .... Inhale Contents of 1 Capsule Once A Day 11)  Oxygen  2l/min .... Continuous 12)  Glucosamine-Chondroitin   Caps (Glucosamine-Chondroit-Vit C-Mn) .... Take 1 Capsule By Mouth Once A Day 13)  Calcium Carbonate-Vitamin D 600-400 Mg-Unit  Tabs (Calcium Carbonate-Vitamin D) .... Take 1 Tablet By Mouth Two Times A Day 14)  Vitamin D 1000 Unit Tabs (Cholecalciferol) .... Take 1 Tablet By Mouth Once A Day 15)  Xopenex 1.25 Mg/9ml  Nebu (Levalbuterol Hcl) .... One Every 4 Hours As Needed 16)  Xopenex Hfa 45 Mcg/act  Aero (Levalbuterol Tartrate) .... Inhale 2 Puffs Every 4 Hours As Needed 17)  Tylenol Arthritis Pain 650 Mg Cr-Tabs (Acetaminophen) .... Per Bottle 18)  Mucinex Dm 30-600 Mg Xr12h-Tab (Dextromethorphan-Guaifenesin) .Marland Kitchen.. 1 Every 12 Hours As Needed W/ Flutter 19)  Tramadol Hcl 50 Mg  Tabs (Tramadol Hcl) .... Take 1 Tablet By Mouth Every 4 Hours As  Needed For Cough 20)  Chlor-Trimeton 4 Mg Tabs (Chlorpheniramine Maleate) .Marland Kitchen.. 1 Every 4 Hours As Needed 21)  Cvs Saline Nasal Spray 0.65 %  Soln (Saline) .... 2 Puffs Every 4 Hours As Needed 22)  Afrin Nasal Spray 0.05 % Soln (Oxymetazoline Hcl) .Marland Kitchen.. 1 Puff Two Times A Day X5days - Before Nasonex 23)  Furosemide 40 Mg Tabs (Furosemide) .Marland Kitchen.. 1 Extra Daily As Needed 24)  Avelox 400 Mg Tabs (Moxifloxacin Hcl) .Marland Kitchen.. 1 By Mouth Once Daily 25)  Prednisone 10 Mg Tabs (Prednisone) .... 4 Tabs For 2 Days, Then 3 Tabs For 2 Days, 2 Tabs For 2 Days, Then 1 Tab For 2 Days, Then Stop 26)  Flexeril 5 Mg Tabs (Cyclobenzaprine Hcl) .Marland Kitchen.. 1 By Mouth Three Times A Day As Needed  Allergies (verified): 1)  ! Pcn  Past History:  Past Medical History: Last updated: 04/18/2009 CHRONIC RHINITIS (ICD-472.0) EDEMA (ICD-782.3) ACUTE AND CHRONIC RESPIRATORY FAILURE (ICD-518.84) MORBID OBESITY (ICD-278.01)     - Target wt  =   179  for BMI < 30 peak wt 282     - Referred back to nutrition December 18, 2007  DEEP VENOUS THROMBOPHLEBITIS, HX OF (ICD-V12.52) COPD  (ICD-496)........................................................Marland KitchenWert     - Pft's 07/25/06 FEV1 55% ratio 53, DLC0 51% DEPRESSION (ICD-311) OSTEOPOROSIS (ICD-733.00) last BMD 4/08 -2.4, intolerant of bisphosphonates  HYPERTENSION (ICD-401.9) Atrial fibrillation - paroxysmal Anticoagulation therapy Pulmonary embolism, hx of neg stress myoview 10/07 Anxiety glucose intolerance - on steroids Hyperlipidemia Complex medical regimen    - Med calendar done 05/01/2008  Past Surgical History: Last updated: 04/15/2008 Hysterectomy Tonsillectomy Skin Graft to midde R finger-1975 s/p left arm fracture with fall off chair  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   Head:  normocephalic and atraumatic.   Eyes:  vision grossly intact, pupils equal, and pupils round.   Ears:  R ear normal and L ear normal.   Nose:  no external deformity and no nasal discharge.   Mouth:  no gingival abnormalities and pharynx pink and moist.   Neck:  supple and no masses.   Lungs:  normal respiratory effort, R decreased breath sounds, and L decreased breath sounds.   Heart:  normal rate and regular rhythm.   Abdomen:  soft, non-tender, and normal bowel sounds.   Msk:  no flank tender, no muscle tender or sweling, spine nontender Extremities:  no edema, no erythema  Neurologic:  cranial nerves II-XII intact and strength normal in all extremities.     Impression & Recommendations:  Problem # 1:  [name] IN LIMB (ICD-729.82) etiology unclear, doubt cymbalta as etioology, but cant r/o dehydration, lyte imbalance, rhabdo - to check labs as below Orders: TLB-BMP (Basic Metabolic Panel-BMET)  (80048-METABOL) TLB-Magnesium (Mg) (83735-MG) TLB-CBC Platelet - w/Differential (85025-CBCD) TLB-Hepatic/Liver Function Pnl (80076-HEPATIC) TLB-TSH (Thyroid Stimulating Hormone) (84443-TSH) TLB-CK Total Only(Creatine Kinase/CPK) (82550-CK)  Problem # 2:  ANXIETY (ICD-300.00)  Her updated medication list for this problem includes:  Cymbalta 60 Mg Cpep (Duloxetine hcl) .Marland Kitchen... 1 by mouth once daily ok to treat as above, f/u any worsening signs or symptoms   Problem # 3:  HYPERTENSION (ICD-401.9)  Her updated medication list for this problem includes:    Furosemide 40 Mg Tabs (Furosemide) .Marland Kitchen... Take 1 tablet by mouth once a day    Exforge 10-320 Mg Tabs (Amlodipine besylate-valsartan) .Marland Kitchen... 1tab by mouth once daily    Furosemide 40 Mg Tabs (Furosemide) .Marland Kitchen... 1 extra daily as needed  BP today: 120/74 Prior BP: 126/74 (07/17/2009)  Labs Reviewed: K+: 5.0 (10/23/2008) Creat: : 0.87 (10/23/2008)   Chol: 179 (10/14/2008)   HDL: 35.60 (10/14/2008)   LDL: 117 (10/14/2008)   TG: 132.0 (10/14/2008) stable overall by hx and exam, ok to continue meds/tx as is   Complete Medication List: 1)  Furosemide 40 Mg Tabs (Furosemide) .... Take 1 tablet by mouth once a day 2)  Coumadin 7.5 Mg Tabs (Warfarin sodium) .... Take asd 1 tablet by mouth once a day 3)  Klor-con 10 10 Meq Tbcr (Potassium chloride) .... Take 1 tablet by mouth two times a day 4)  Cymbalta 60 Mg Cpep (Duloxetine hcl) .Marland Kitchen.. 1 by mouth once daily 5)  Omeprazole 20 Mg Cpdr (Omeprazole) .Marland Kitchen.. 1 by mouth 30 min before 1st meal 6)  Propafenone Hcl 325 Mg Xr12h-cap (Propafenone hcl) .Marland Kitchen.. 1 tab daily 7)  Nasonex 50 Mcg/act Susp (Mometasone furoate) .... 2 puffs each nostril two times a day 8)  Symbicort 160-4.5 Mcg/act Aero (Budesonide-formoterol fumarate) .... Two puffs twice daily 9)  Exforge 10-320 Mg Tabs (Amlodipine besylate-valsartan) .Marland Kitchen.. 1tab by mouth once daily 10)  Spiriva Handihaler 18 Mcg Caps (Tiotropium bromide monohydrate)  .... Inhale contents of 1 capsule once a day 11)  Oxygen 2l/min  .... Continuous 12)  Glucosamine-chondroitin Caps (Glucosamine-chondroit-vit c-mn) .... Take 1 capsule by mouth once a day 13)  Calcium Carbonate-vitamin D 600-400 Mg-unit Tabs (Calcium carbonate-vitamin d) .... Take 1 tablet by mouth two times a day 14)  Vitamin D 1000 Unit Tabs (Cholecalciferol) .... Take 1 tablet by mouth once a day 15)  Xopenex 1.25 Mg/39ml Nebu (Levalbuterol hcl) .... One every 4 hours as needed 16)  Xopenex Hfa 45 Mcg/act Aero (Levalbuterol tartrate) .... Inhale 2 puffs every 4 hours as needed 17)  Tylenol Arthritis Pain 650 Mg Cr-tabs (Acetaminophen) .... Per bottle 18)  Mucinex Dm 30-600 Mg Xr12h-tab (Dextromethorphan-guaifenesin) .Marland Kitchen.. 1 every 12 hours as needed w/ flutter 19)  Tramadol Hcl 50 Mg Tabs (Tramadol hcl) .... Take 1 tablet by mouth every 4 hours as  needed for cough 20)  Chlor-trimeton 4 Mg Tabs (Chlorpheniramine maleate) .Marland Kitchen.. 1 every 4 hours as needed 21)  Cvs Saline Nasal Spray 0.65 % Soln (Saline) .... 2 puffs every 4 hours as needed 22)  Afrin Nasal Spray 0.05 % Soln (Oxymetazoline hcl) .Marland Kitchen.. 1 puff two times a day x5days - before nasonex 23)  Furosemide 40 Mg Tabs (Furosemide) .Marland Kitchen.. 1 extra daily as needed 24)  Avelox 400 Mg Tabs (Moxifloxacin hcl) .Marland Kitchen.. 1 by mouth once daily 25)  Prednisone 10 Mg Tabs (Prednisone) .... 4 tabs for 2 days, then 3 tabs for 2 days, 2 tabs for 2 days, then 1 tab for 2 days, then stop 26)  Flexeril 5 Mg Tabs (Cyclobenzaprine hcl) .Marland Kitchen.. 1 by mouth three times a day as needed  Patient Instructions: 1)  Please take all new medications as prescribed  2)  Continue all previous medications as before this visit  3)  Please go to the Lab in the basement for your blood and/or urine tests today  4)  Please schedule a follow-up appointment in 3 months with CPX labs Prescriptions: FLEXERIL 5 MG TABS (CYCLOBENZAPRINE HCL) 1 by mouth three times a day as needed  #60 x 1    Entered and Authorized by:   Corwin Levins MD   Signed by:   Corwin Levins MD on 07/29/2009   Method used:   Print then Give to Patient   RxID:   [id number] CYMBALTA 60 MG CPEP (DULOXETINE HCL) 1 by mouth once daily  #90 x 3   Entered and Authorized by:   Corwin Levins MD   Signed by:   Corwin Levins MD on 07/29/2009   Method used:   Print then Give to Patient   RxID:   [id number]

## 2010-02-10 NOTE — Assessment & Plan Note (Signed)
Summary: SINUS HEADACHE WATERY EYES  EARACHE-STC   Vital Signs:  Patient profile:   60 year old female Height:      65 inches Weight:      277.13 pounds BMI:     46.28 O2 Sat:      97 % on Room air Temp:     97.9 degrees F oral Pulse rate:   73 / minute BP sitting:   132 / 74  (left arm) Cuff size:   large  Vitals Entered By: Zella Ball Ewing CMA (AAMA) (November 27, 2009 11:11 AM)  O2 Flow:  Room air CC: Sinus congestion, head pain, eye drainage/RE   Primary Care Provider:  Corwin Levins MD  CC:  Sinus congestion, head pain, and eye drainage/RE.  History of Present Illness: here to f/u ;  just had prolia injection nov 15 and tolerated well;  today here with 2-3 days onset mild to mod illness with facial pain, pressure, fever  and greenish d/c, has also slight ST, but Pt denies CP, worsening sob, doe, wheezing, orthopnea, pnd, worsening LE edema, palps, dizziness or syncope  Pt denies new neuro symptoms such as headache, facial or extremity weakness  Pt denies polydipsia, polyuria   Overall good compliance with meds, trying to follow low chol  diet, wt stable, little excercise however.  Does incidently have mild to mod 7 days left wrist pain, swelling, tender and decresaed ROM without overuse, trauma , fever prior to onset sinus symptoms, or hx of gout.  Has done some yardwork recetnly but not clear if assoc.  No recent wt loss, night sweats, loss of appetite or other constitutional symptoms   Problems Prior to Update: 1)  Wrist Pain, Left  (ICD-719.43) 2)  Chest Pain  (ICD-786.50) 3)  Rhabdomyolysis  (ICD-728.88) 4)  Cramp in Limb  (ICD-729.82) 5)  Fracture, Rib, Right  (ICD-807.00) 6)  Sinusitis- Acute-nos  (ICD-461.9) 7)  Preventive Health Care  (ICD-V70.0) 8)  Premature Ventricular Contractions  (ICD-427.69) 9)  Sinusitis- Acute-nos  (ICD-461.9) 10)  Uti  (ICD-599.0) 11)  Dysuria  (ICD-788.1) 12)  Atrial Flutter  (ICD-427.32) 13)  Cough  (ICD-786.2) 14)  Ankle Pain   (ICD-719.47) 15)  Dependent Edema, Right Leg  (ICD-782.3) 16)  Pneumonia Organism Nos  (ICD-486) 17)  Preventive Health Care  (ICD-V70.0) 18)  Conjunctivitis  (ICD-372.30) 19)  Vitamin D Deficiency  (ICD-268.9) 20)  Preventive Health Care  (ICD-V70.0) 21)  Hyperlipidemia  (ICD-272.4) 22)  Glucose Intolerance  (ICD-271.3) 23)  Anxiety  (ICD-300.00) 24)  Pulmonary Embolism, Hx of  (ICD-V12.51) 25)  Anticoagulation Therapy  (ICD-V58.61) 26)  Atrial Fibrillation  (ICD-427.31) 27)  Chronic Rhinitis  (ICD-472.0) 28)  Edema  (ICD-782.3) 29)  Obstructive Chronic Bronchitis With Exacerbation  (ICD-491.21) 30)  Acute and Chronic Respiratory Failure  (ICD-518.84) 31)  Morbid Obesity  (ICD-278.01) 32)  Deep Venous Thrombophlebitis, Hx of  (ICD-V12.52) 33)  COPD  (ICD-496) 34)  Depression  (ICD-311) 35)  Osteoporosis  (ICD-733.00) 36)  Hypertension  (ICD-401.9)  Medications Prior to Update: 1)  Furosemide 40 Mg  Tabs (Furosemide) .... Take 1 Tablet By Mouth Once A Day 2)  Coumadin 7.5 Mg Tabs (Warfarin Sodium) .... Take Asd 1 Tablet By Mouth Once A Day 3)  Klor-Con 10 10 Meq Tbcr (Potassium Chloride) .... Take 1 Tablet By Mouth Two Times A Day 4)  Fluoxetine Hcl 40 Mg Caps (Fluoxetine Hcl) .Marland Kitchen.. 1 Once Daily 5)  Omeprazole 20 Mg Cpdr (Omeprazole) .Marland Kitchen.. 1 By Mouth  30 Min Before 1st Meal 6)  Propafenone Hcl 325 Mg Xr12h-Cap (Propafenone Hcl) .... 2 Tab Daily 7)  Nasonex 50 Mcg/act Susp (Mometasone Furoate) .... 2 Puffs Each Nostril Two Times A Day 8)  Symbicort 160-4.5 Mcg/act  Aero (Budesonide-Formoterol Fumarate) .... Two Puffs Twice Daily 9)  Exforge 10-320 Mg  Tabs (Amlodipine Besylate-Valsartan) .Marland Kitchen.. 1tab By Mouth Once Daily 10)  Spiriva Handihaler 18 Mcg  Caps (Tiotropium Bromide Monohydrate) .... Inhale Contents of 1 Capsule Once A Day 11)  Oxygen 2l/min .... Continuous 12)  Glucosamine-Chondroitin   Caps (Glucosamine-Chondroit-Vit C-Mn) .... Take 1 Capsule By Mouth Once A Day 13)  Calcium  Carbonate-Vitamin D 600-400 Mg-Unit  Tabs (Calcium Carbonate-Vitamin D) .... Take 1 Tablet By Mouth Two Times A Day 14)  Vitamin D 1000 Unit Tabs (Cholecalciferol) .... Take 1 Tablet By Mouth Once A Day 15)  Xopenex 1.25 Mg/74ml  Nebu (Levalbuterol Hcl) .... One Every 4 Hours As Needed 16)  Xopenex Hfa 45 Mcg/act  Aero (Levalbuterol Tartrate) .... Inhale 2 Puffs Every 4 Hours As Needed 17)  Tylenol Arthritis Pain 650 Mg Cr-Tabs (Acetaminophen) .... Per Bottle 18)  Mucinex Dm 30-600 Mg Xr12h-Tab (Dextromethorphan-Guaifenesin) .Marland Kitchen.. 1 Every 12 Hours As Needed W/ Flutter 19)  Tramadol Hcl 50 Mg  Tabs (Tramadol Hcl) .... Take 1 Tablet By Mouth Every 4 Hours As  Needed For Cough 20)  Chlor-Trimeton 4 Mg Tabs (Chlorpheniramine Maleate) .Marland Kitchen.. 1 Every 4 Hours As Needed 21)  [facility]vs Saline Nasal Spray 0.65 %  Soln (Saline) .... 2 Puffs Every 4 Hours As Needed 22)  Afrin Nasal Spray 0.05 % Soln (Oxymetazoline Hcl) .Marland Kitchen.. 1 Puff Two Times A Day X5days - Before Nasonex  Current Medications (verified): 1)  Furosemide 40 Mg  Tabs (Furosemide) .... Take 1 Tablet By Mouth Once A Day 2)  Coumadin 7.5 Mg Tabs (Warfarin Sodium) .... Take Asd 1 Tablet By Mouth Once A Day 3)  Klor-Con 10 10 Meq Tbcr (Potassium Chloride) .... Take 1 Tablet By Mouth Two Times A Day 4)  Fluoxetine Hcl 40 Mg Caps (Fluoxetine Hcl) .Marland Kitchen.. 1 Once Daily 5)  Omeprazole 20 Mg Cpdr (Omeprazole) .Marland Kitchen.. 1 By Mouth 30 Min Before 1st Meal 6)  Propafenone Hcl 325 Mg Xr12h-Cap (Propafenone Hcl) .... 2 Tab Daily 7)  Nasonex 50 Mcg/act Susp (Mometasone Furoate) .... 2 Puffs Each Nostril Two Times A Day 8)  Symbicort 160-4.5 Mcg/act  Aero (Budesonide-Formoterol Fumarate) .... Two Puffs Twice Daily 9)  Exforge 10-320 Mg  Tabs (Amlodipine Besylate-Valsartan) .Marland Kitchen.. 1tab By Mouth Once Daily 10)  Spiriva Handihaler 18 Mcg  Caps (Tiotropium Bromide Monohydrate) .... Inhale Contents of 1 Capsule Once A Day 11)  Oxygen 2l/min .... Continuous 12)  Glucosamine-Chondroitin    Caps (Glucosamine-Chondroit-Vit C-Mn) .... Take 1 Capsule By Mouth Once A Day 13)  Calcium Carbonate-Vitamin D 600-400 Mg-Unit  Tabs (Calcium Carbonate-Vitamin D) .... Take 1 Tablet By Mouth Two Times A Day 14)  Vitamin D 1000 Unit Tabs (Cholecalciferol) .... Take 1 Tablet By Mouth Once A Day 15)  Xopenex 1.25 Mg/62ml  Nebu (Levalbuterol Hcl) .... One Every 4 Hours As Needed 16)  Xopenex Hfa 45 Mcg/act  Aero (Levalbuterol Tartrate) .... Inhale 2 Puffs Every 4 Hours As Needed 17)  Tylenol Arthritis Pain 650 Mg Cr-Tabs (Acetaminophen) .... Per Bottle 18)  Mucinex Dm 30-600 Mg Xr12h-Tab (Dextromethorphan-Guaifenesin) .Marland Kitchen.. 1 Every 12 Hours As Needed W/ Flutter 19)  Tramadol Hcl 50 Mg  Tabs (Tramadol Hcl) .... Take 1 Tablet By Mouth Every  4 Hours As  Needed For Cough 20)  Chlor-Trimeton 4 Mg Tabs (Chlorpheniramine Maleate) .Marland Kitchen.. 1 Every 4 Hours As Needed 21)  Cvs Saline Nasal Spray 0.65 %  Soln (Saline) .... 2 Puffs Every 4 Hours As Needed 22)  Afrin Nasal Spray 0.05 % Soln (Oxymetazoline Hcl) .Marland Kitchen.. 1 Puff Two Times A Day X5days - Before Nasonex 23)  Prolia 60 Mg/ml Soln (Denosumab) .... Inject Once Every 6 Months 24)  Levofloxacin 500 Mg Tabs (Levofloxacin) .Marland Kitchen.. 1 By Mouth Once Daily  Allergies (verified): 1)  ! Pcn 2)  * Cymbalta  Past History:  Past Medical History: Last updated: 09/16/2009 CHRONIC RHINITIS (ICD-472.0) EDEMA (ICD-782.3) ACUTE AND CHRONIC RESPIRATORY FAILURE (ICD-518.84) MORBID OBESITY (ICD-278.01)     - Target wt  =   179  for BMI < 30 peak wt 282     - Referred back to nutrition December 18, 2007  DEEP VENOUS THROMBOPHLEBITIS, HX OF (ICD-V12.52) COPD (ICD-496)........................................................Marland KitchenWert     - PFTs  07/25/06 FEV1 55% ratio 53, DLC0 51% DEPRESSION (ICD-311) OSTEOPOROSIS (ICD-733.00) last BMD 4/08 -2.4, intolerant of bisphosphonates  HYPERTENSION (ICD-401.9) Atrial fibrillation - paroxysmal Anticoagulation therapy Pulmonary embolism, hx  of neg stress myoview 10/07 Anxiety glucose intolerance - on steroids Hyperlipidemia Complex medical regimen    - Med calendar done 05/01/2008  Past Surgical History: Last updated: 04/15/2008 Hysterectomy Tonsillectomy Skin Graft to midde R finger-1975 s/p left arm fracture with fall off chair  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  , mild ill  Head:  normocephalic and atraumatic.   Eyes:  vision grossly intact, pupils equal, and pupils round.   Ears:  bilat tm's mild red, sinus tender bilat Nose:  nasal dischargemucosal pallor and mucosal edema.   Mouth:  pharyngeal erythema and fair dentition.   Neck:  supple and cervical lymphadenopathy.   Lungs:  normal respiratory effort and normal breath sounds.   Heart:  normal rate and regular rhythm.   Msk:  left wrist with 1+ swelling , mild tender and mild decreased ROM - hand and wrist o/w neurovasc intact Extremities:  no edema, no erythema    Impression & Recommendations:  Problem # 1:  SINUSITIS- ACUTE-NOS (ICD-461.9)  Her updated medication list for this problem includes:    Nasonex 50 Mcg/act Susp (Mometasone furoate) .Marland Kitchen... 2 puffs each nostril two times a day    Mucinex Dm 30-600 Mg Xr12h-tab (Dextromethorphan-guaifenesin) .Marland Kitchen... 1 every 12 hours as needed w/ flutter    Cvs Saline Nasal Spray 0.65 % Soln (Saline) .Marland Kitchen... 2 puffs every 4 hours as needed    Afrin Nasal Spray 0.05 % Soln (Oxymetazoline hcl) .Marland Kitchen... 1 puff two times a day x5days - before nasonex    Levofloxacin 500 Mg Tabs (Levofloxacin) .Marland Kitchen... 1 by mouth once daily treat as above, f/u any worsening signs or symptoms   Problem # 2:  WRIST PAIN, LEFT  (GNF-621.30)  ? gout vs DJD flare - for depomedrol IM today  Orders: Depo- Medrol 40mg  (J1030) Depo- Medrol 80mg  (J1040) Admin of Therapeutic Inj  intramuscular or subcutaneous (86578)  Problem # 3:  GLUCOSE INTOLERANCE (ICD-271.3) asympt - pt to call for onset polys or cbg >  200;  Pt to cont DM diet, excercise, wt control efforts; to check labs next visit  Problem # 4:  HYPERTENSION (ICD-401.9)  Her updated medication list for this problem includes:    Furosemide 40 Mg Tabs (Furosemide) .Marland Kitchen... Take 1 tablet by mouth once a day    Exforge 10-320 Mg Tabs (Amlodipine besylate-valsartan) .Marland Kitchen... 1tab by mouth once daily  BP today: 132/74 Prior BP: 110/80 (10/30/2009)  Labs Reviewed: K+: 4.1 (10/16/2009) Creat: : 0.9 (10/16/2009)   Chol: 197 (10/16/2009)   HDL: 42.70 (10/16/2009)   LDL: 132 (10/16/2009)   TG: 111.0 (10/16/2009) stable overall by hx and exam, ok to continue meds/tx as is   Complete Medication List: 1)  Furosemide 40 Mg Tabs (Furosemide) .... Take 1 tablet by mouth once a day 2)  Coumadin 7.5 Mg Tabs (Warfarin sodium) .... Take asd 1 tablet by mouth once a day 3)  Klor-con 10 10 Meq Tbcr (Potassium chloride) .... Take 1 tablet by mouth two times a day 4)  Fluoxetine Hcl 40 Mg Caps (Fluoxetine hcl) .Marland Kitchen.. 1 once daily 5)  Omeprazole 20 Mg Cpdr (Omeprazole) .Marland Kitchen.. 1 by mouth 30 min before 1st meal 6)  Propafenone Hcl 325 Mg Xr12h-cap (Propafenone hcl) .... 2 tab daily 7)  Nasonex 50 Mcg/act Susp (Mometasone furoate) .... 2 puffs each nostril two times a day 8)  Symbicort 160-4.5 Mcg/act Aero (Budesonide-formoterol fumarate) .... Two puffs twice daily 9)  Exforge 10-320 Mg Tabs (Amlodipine besylate-valsartan) .Marland Kitchen.. 1tab by mouth once daily 10)  Spiriva Handihaler 18 Mcg Caps (Tiotropium bromide monohydrate) .... Inhale contents of 1 capsule once a day 11)  Oxygen 2l/min  .... Continuous 12)  Glucosamine-chondroitin Caps (Glucosamine-chondroit-vit c-mn) .... Take 1 capsule by  mouth once a day 13)  Calcium Carbonate-vitamin D 600-400 Mg-unit Tabs (Calcium carbonate-vitamin d) .... Take 1 tablet by mouth two times a day 14)  Vitamin D 1000 Unit Tabs (Cholecalciferol) .... Take 1 tablet by mouth once a day 15)  Xopenex 1.25 Mg/43ml Nebu (Levalbuterol hcl) .... One every 4 hours as needed 16)  Xopenex Hfa 45 Mcg/act Aero (Levalbuterol tartrate) .... Inhale 2 puffs every 4 hours as needed 17)  Tylenol Arthritis Pain 650 Mg Cr-tabs (Acetaminophen) .... Per bottle 18)  Mucinex Dm 30-600 Mg Xr12h-tab (Dextromethorphan-guaifenesin) .Marland Kitchen.. 1 every 12 hours as needed w/ flutter 19)  Tramadol Hcl 50 Mg Tabs (Tramadol hcl) .... Take 1 tablet by mouth every 4 hours as  needed for cough 20)  Chlor-trimeton 4 Mg Tabs (Chlorpheniramine maleate) .Marland Kitchen.. 1 every 4 hours as needed 21)  Cvs Saline Nasal Spray 0.65 % Soln (Saline) .... 2 puffs every 4 hours as needed 22)  Afrin Nasal Spray 0.05 % Soln (Oxymetazoline hcl) .Marland Kitchen.. 1 puff two times a day x5days - before nasonex 23)  Prolia 60 Mg/ml Soln (Denosumab) .... Inject once every 6 months 24)  Levofloxacin 500 Mg Tabs (Levofloxacin) .Marland Kitchen.. 1 by mouth once daily  Patient Instructions: 1)  Please take all new medications as prescribed  - [facility] 2)  you had the steroid shot today 3)  Continue all previous medications as before this visit 4)  Please schedule a follow-up appointment in April 2012 with 5)  BMP prior to visit, ICD-9: 790.2 6)  Lipid Panel prior to visit, ICD-9: 272.0 7)  HbgA1C prior to visit, ICD-9: 790.2 Prescriptions: LEVOFLOXACIN 500 MG TABS (LEVOFLOXACIN) 1 by mouth once daily  #10 x 0   Entered and Authorized by:  Corwin Levins MD   Signed by:   Corwin Levins MD on 11/27/2009   Method used:   [facility]        [facility]  [phone]* (retail)       [address]       [place], [place]  [id number]       Ph: [phone] or [phone]       Fax:  [phone]   RxID:   [id number]    Medication Administration  Injection # 1:    Medication: Depo- Medrol 40mg     Diagnosis: WRIST PAIN, LEFT (ICD-719.43)    Route: IM    Site: RUOQ gluteus    Exp Date: 04/2012    Lot #: [id number]    Mfr: Pharmacia    Comments: patient received 120mg  Depo-Medrol    Patient tolerated injection without complications    Given by: Zella Ball Ewing CMA Duncan Dull) (November 27, 2009 12:03 PM)  Injection # 2:    Medication: Depo- Medrol 80mg     Diagnosis: WRIST PAIN, LEFT (ICD-719.43)    Route: IM    Site: RUOQ gluteus    Exp Date: 04/2012    Lot #: [id number]    Mfr: Pharmacia    Given by: Zella Ball Ewing CMA Duncan Dull) (November 27, 2009 12:03 PM)  Orders Added: 1)  Depo- Medrol 40mg  [J1030] 2)  Depo- Medrol 80mg  [J1040] 3)  Admin of Therapeutic Inj  intramuscular or subcutaneous [96372] 4)  Est. Patient Level IV [51761] b

## 2010-02-10 NOTE — Procedures (Signed)
Summary: colon/[facility]  colon/[facility]   Imported By: Lester [place] 06/20/2007 08:19:56  _____________________________________________________________________  External Attachment:    Type:   Image     Comment:   External Document

## 2010-02-10 NOTE — Medication Information (Signed)
Summary: rov/ewj  Anticoagulant Therapy  Managed by: Cloyde Reams, RN, BSN Referring MD: Valera Castle MD PCP: Corwin Levins MD Supervising MD: Shirlee Latch MD, [place] Indication 1: Pulmonary embolism (ICD-415.19) Lab Used: [facility] Site: [facility] INR POC 3.0 INR RANGE 2 - 3  Dietary changes: no    Health status changes: no    Bleeding/hemorrhagic complications: no    Recent/future hospitalizations: no    Any changes in medication regimen? yes       Details: Allegra prn for sinuses.   Recent/future dental: no  Any missed doses?: no       Is patient compliant with meds? yes       Allergies: 1)  ! Pcn 2)  * Cymbalta  Anticoagulation Management History:      The patient is taking warfarin and comes in today for a routine follow up visit.  Negative risk factors for bleeding include an age less than 80 years old.  The bleeding index is 'low risk'.  Positive CHADS2 values include History of HTN.  Negative CHADS2 values include Age > 56 years old.  The start date was 06/13/2008.  Her last INR was 5.6.  Anticoagulation responsible provider: Shirlee Latch MD, [place].  INR POC: 3.0.  Cuvette Lot#: [id number].  Exp: 11/2010.    Anticoagulation Management Assessment/Plan:      The patient's current anticoagulation dose is Coumadin 7.5 mg tabs: Take asd 1 tablet by mouth once a day.  The target INR is 2 - 3.  The next INR is due 11/05/2009.  Anticoagulation instructions were given to patient.  Results were reviewed/authorized by Cloyde Reams, RN, BSN.  She was notified by Cloyde Reams RN.         Prior Anticoagulation Instructions: INR 3.3  Skip today's dosage of Coumadin, then start taking 1 tablet daily except 1.5 tablets on Tuesdays, Thursdays, and Saturdays.  Recheck in 3 weeks.    Current Anticoagulation Instructions: INR 3.0  Continue on same dosage 1 tablet daily except 1.5 tablets on Tuesdays, Thursdays, and Saturdays.  Recheck in 4 weeks.

## 2010-02-10 NOTE — Miscellaneous (Signed)
Summary: Orders Update  Clinical Lists Changes  Orders: Added new Service order of EKG w/ Interpretation (93000) - Signed 

## 2010-02-10 NOTE — Progress Notes (Signed)
  Phone Note Call from Patient   Caller: Patient Call For: on call  Summary of Call: pt calls earlier this pm and co  of ha most of the day and feels that bp is low in the 104 range . pulse not elevated.  altreay ttook bp med today. no bleeding no fever or other change.  rec she not take bp med in am and see her PCP .  to ED if bp below 100 and feels lightheaded or worsening signs .  Initial call taken by: Madelin Headings MD,  September 10, 2008 9:06 PM

## 2010-02-10 NOTE — Progress Notes (Signed)
Summary: COUMADIN LEVEL  Phone Note Call from Patient Call back at Home Phone [phone]   Caller: SELF Call For: [name] [name] Summary of Call: COUMADIN WAS CHECKED IN THE ER YESTERDAY-IT WAS 2.5 Initial call taken by: Harlon Flor,  June 11, 2009 10:29 AM  Follow-up for Phone Call        In E-chart PT 24.7 INR 2.25 on 06/10/09. Called spoke with pt.  See coumadin note in EMR. Follow-up by: Cloyde Reams RN,  June 11, 2009 2:54 PM

## 2010-02-10 NOTE — Miscellaneous (Signed)
Summary: BONE DENSITY  Clinical Lists Changes  Orders: Added new Test order of T-Lumbar Vertebral Assessment (77082) - Signed 

## 2010-02-10 NOTE — Assessment & Plan Note (Signed)
Summary: HEAD ACHE COUGH FEEL LIGHT  $50  STC   Vital Signs:  Patient profile:   60 year old female Height:      65 inches Weight:      254.13 pounds BMI:     42.44 O2 Sat:      97 % Temp:     97.4 degrees F oral Pulse rate:   64 / minute BP sitting:   104 / 70  (left arm) Cuff size:   large  Vitals Entered By: Windell Norfolk (September 11, 2008 11:27 AM) CC: cough and headache low BP x 2-3 days   Primary Care Provider:  Jonny Ruiz  CC:  cough and headache low BP x 2-3 days.  History of Present Illness: here with acute onset 2 -3 days mild to mod fever, facial pain, pressure, and greenish d/c with small blood, with earache worse on the right, some increased cough non prod but no CP, increased sob, doe, wheezing, orthopnea, pnd, worsening LE edema, palps, dizziness or syncope .  Pt denies new neuro symptoms such as headache, facial or extremity weakness   Problems Prior to Update: 1)  Sinusitis- Acute-nos  (ICD-461.9) 2)  Uti  (ICD-599.0) 3)  Dysuria  (ICD-788.1) 4)  Atrial Flutter  (ICD-427.32) 5)  Cough  (ICD-786.2) 6)  Ankle Pain  (ICD-719.47) 7)  Dependent Edema, Right Leg  (ICD-782.3) 8)  Pneumonia Organism Nos  (ICD-486) 9)  Preventive Health Care  (ICD-V70.0) 10)  Conjunctivitis  (ICD-372.30) 11)  Vitamin D Deficiency  (ICD-268.9) 12)  Preventive Health Care  (ICD-V70.0) 13)  Hyperlipidemia  (ICD-272.4) 14)  Glucose Intolerance  (ICD-271.3) 15)  Anxiety  (ICD-300.00) 16)  Pulmonary Embolism, Hx of  (ICD-V12.51) 17)  Anticoagulation Therapy  (ICD-V58.61) 18)  Atrial Fibrillation  (ICD-427.31) 19)  Chronic Rhinitis  (ICD-472.0) 20)  Edema  (ICD-782.3) 21)  Obstructive Chronic Bronchitis With Exacerbation  (ICD-491.21) 22)  Acute and Chronic Respiratory Failure  (ICD-518.84) 23)  Morbid Obesity  (ICD-278.01) 24)  Deep Venous Thrombophlebitis, Hx of  (ICD-V12.52) 25)  COPD  (ICD-496) 26)  Depression  (ICD-311) 27)  Osteoporosis  (ICD-733.00)  28)  Hypertension  (ICD-401.9)  Medications Prior to Update: 1)  Furosemide 40 Mg  Tabs (Furosemide) .... Take 1 Tablet By Mouth Once A Day and As Needed For Excessive Swelling 2)  Coumadin 7.5 Mg Tabs (Warfarin Sodium) .... Take Asd 1 Tablet By Mouth Once A Day 3)  Klor-Con 10 10 Meq Tbcr (Potassium Chloride) .... Take 2 Tabs Every Am and 1 Tab At Bedtime 4)  Fluoxetine Hcl 40 Mg Caps (Fluoxetine Hcl) .... Take 1 Tablet By Mouth Once A Day 5)  Omeprazole 20 Mg Cpdr (Omeprazole) .Marland Kitchen.. 1 By Mouth 30 Min Before 1st Meal 6)  Rythmol Sr 325 Mg Cp12 (Propafenone Hcl) .... Two Times A Day 7)  Veramyst 27.5 Mcg/spray  Susp (Fluticasone Furoate) .... One Spray Two Times A Day 8)  Symbicort 160-4.5 Mcg/act  Aero (Budesonide-Formoterol Fumarate) .... Two Puffs Twice Daily 9)  Exforge 10-320 Mg  Tabs (Amlodipine Besylate-Valsartan) .Marland Kitchen.. 1tab By Mouth Once Daily 10)  Spiriva Handihaler 18 Mcg  Caps (Tiotropium Bromide Monohydrate) .... Inhale Contents of 1 Capsule Once A Day 11)  Oxygen 2l Liquid .... Continuous 12)  Glucosamine-Chondroitin   Caps (Glucosamine-Chondroit-Vit C-Mn) .... Take 1 Capsule By Mouth Once A Day 13)  Calcium Carbonate-Vitamin D 600-400 Mg-Unit  Tabs (Calcium Carbonate-Vitamin D) .... Take 1 Tablet By Mouth Two Times A Day 14)  Vitamin D  1000 Unit Tabs (Cholecalciferol) .... Take 1 Tablet By Mouth Once A Day 15)  Forteo 600 Mcg/2.37ml Soln (Teriparatide (Recombinant)) .... 20 Micrograms Subcutaneously Once Daily 16)  Xopenex 1.25 Mg/62ml  Nebu (Levalbuterol Hcl) .... One Every 4 Hours As Needed 17)  Xopenex Hfa 45 Mcg/act  Aero (Levalbuterol Tartrate) .... Inhale 2 Puffs Every 4 Hours As Needed 18)  Tylenol Arthritis Pain 650 Mg Cr-Tabs (Acetaminophen) .... Per Bottle 19)  Mucinex Dm 30-600 Mg Xr12h-Tab (Dextromethorphan-Guaifenesin) .Marland Kitchen.. 1 Every 12 Hours As Needed 20)  Tramadol Hcl 50 Mg  Tabs (Tramadol Hcl) .... Take 1-2 Tablet By Mouth Every 4 Hours As  Needed For Cough  21)  Fexofenadine Hcl 180 Mg Tabs (Fexofenadine Hcl) .... Take 1 Tablet By Mouth Once A Day 22)  Cvs Saline Nasal Spray 0.65 %  Soln (Saline) .... 2 Puffs Every 4 Hours As Needed 23)  Afrin Nasal Spray 0.05 % Soln (Oxymetazoline Hcl) .Marland Kitchen.. 1 Puff Two Times A Day X5days 24)  Tessalon Perles 100 Mg Caps (Benzonatate) .Marland Kitchen.. 1 - 2 By Mouth Three Times A Day As Needed  Current Medications (verified): 1)  Furosemide 40 Mg  Tabs (Furosemide) .... Take 1 Tablet By Mouth Once A Day and As Needed For Excessive Swelling 2)  Coumadin 7.5 Mg Tabs (Warfarin Sodium) .... Take Asd 1 Tablet By Mouth Once A Day 3)  Klor-Con 10 10 Meq Tbcr (Potassium Chloride) .... Take 2 Tabs Every Am and 1 Tab At Bedtime 4)  Fluoxetine Hcl 40 Mg Caps (Fluoxetine Hcl) .... Take 1 Tablet By Mouth Once A Day 5)  Omeprazole 20 Mg Cpdr (Omeprazole) .Marland Kitchen.. 1 By Mouth 30 Min Before 1st Meal 6)  Rythmol Sr 325 Mg Cp12 (Propafenone Hcl) .... Two Times A Day 7)  Veramyst 27.5 Mcg/spray  Susp (Fluticasone Furoate) .... One Spray Two Times A Day 8)  Symbicort 160-4.5 Mcg/act  Aero (Budesonide-Formoterol Fumarate) .... Two Puffs Twice Daily 9)  Exforge 10-320 Mg  Tabs (Amlodipine Besylate-Valsartan) .Marland Kitchen.. 1tab By Mouth Once Daily 10)  Spiriva Handihaler 18 Mcg  Caps (Tiotropium Bromide Monohydrate) .... Inhale Contents of 1 Capsule Once A Day 11)  Oxygen 2l Liquid .... Continuous 12)  Glucosamine-Chondroitin   Caps (Glucosamine-Chondroit-Vit C-Mn) .... Take 1 Capsule By Mouth Once A Day 13)  Calcium Carbonate-Vitamin D 600-400 Mg-Unit  Tabs (Calcium Carbonate-Vitamin D) .... Take 1 Tablet By Mouth Two Times A Day 14)  Vitamin D 1000 Unit Tabs (Cholecalciferol) .... Take 1 Tablet By Mouth Once A Day 15)  Forteo 600 Mcg/2.40ml Soln (Teriparatide (Recombinant)) .... 20 Micrograms Subcutaneously Once Daily 16)  Xopenex 1.25 Mg/62ml  Nebu (Levalbuterol Hcl) .... One Every 4 Hours As Needed  17)  Xopenex Hfa 45 Mcg/act  Aero (Levalbuterol Tartrate) .... Inhale 2 Puffs Every 4 Hours As Needed 18)  Tylenol Arthritis Pain 650 Mg Cr-Tabs (Acetaminophen) .... Per Bottle 19)  Mucinex Dm 30-600 Mg Xr12h-Tab (Dextromethorphan-Guaifenesin) .Marland Kitchen.. 1 Every 12 Hours As Needed 20)  Tramadol Hcl 50 Mg  Tabs (Tramadol Hcl) .... Take 1-2 Tablet By Mouth Every 4 Hours As  Needed For Cough 21)  Fexofenadine Hcl 180 Mg Tabs (Fexofenadine Hcl) .... Take 1 Tablet By Mouth Once A Day 22)  Cvs Saline Nasal Spray 0.65 %  Soln (Saline) .... 2 Puffs Every 4 Hours As Needed 23)  Afrin Nasal Spray 0.05 % Soln (Oxymetazoline Hcl) .Marland Kitchen.. 1 Puff Two Times A Day X5days 24)  Tessalon Perles 100 Mg Caps (Benzonatate) .Marland Kitchen.. 1 - 2 By Mouth  Three Times A Day As Needed 25)  Septra Ds 800-160 Mg Tabs (Sulfamethoxazole-Trimethoprim) .Marland Kitchen.. 1po Two Times A Day  Allergies (verified): 1)  ! Pcn  Past History:  Past Medical History: Last updated: 06/20/2008 CHRONIC RHINITIS (ICD-472.0) EDEMA (ICD-782.3) ACUTE AND CHRONIC RESPIRATORY FAILURE (ICD-518.84) MORBID OBESITY (ICD-278.01)     - Target wt  =   179  for BMI < 30 peak wt 282     - Referred back to nutrition December 18, 2007  DEEP VENOUS THROMBOPHLEBITIS, HX OF (ICD-V12.52) COPD (ICD-496)     - Pft's 07/25/06 FEV1 55% ratio 53, DLC0 51% DEPRESSION (ICD-311) OSTEOPOROSIS (ICD-733.00) last BMD 4/08 -2.4, intolerant of bisphosphonates  HYPERTENSION (ICD-401.9) Atrial fibrillation - paroxysmal Anticoagulation therapy Pulmonary embolism, hx of neg stress myoview 10/07 Anxiety glucose intolerance - on steroids Hyperlipidemia Complex medical regimen    - Med calendar done 05/01/2008  Past Surgical History: Last updated: 04/15/2008 Hysterectomy Tonsillectomy Skin Graft to midde R finger-1975 s/p left arm fracture with fall off chair  Social History: Last updated: 06/19/2007 Patient states former smoker quit smoking in 2003 Married 3 children Alcohol use-no  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  , mild ill  Head:  normocephalic and atraumatic.   Eyes:  vision grossly intact, pupils equal, and pupils round.   Ears:  bilat tm's red, right max sinus tender as well  Nose:  no external deformity and no nasal discharge.   Mouth:  no gingival abnormalities and pharynx pink and moist.   Neck:  supple and no masses.   Lungs:  normal respiratory effort, R decreased breath sounds, and L decreased breath sounds.   Heart:  normal rate and regular rhythm.   Extremities:  no edema, no erythema    Impression & Recommendations:  Problem # 1:  SINUSITIS- ACUTE-NOS (ICD-461.9)  Her updated medication list for this problem includes:    Veramyst 27.5 Mcg/spray Susp (Fluticasone furoate) ..... One spray two times a day    Mucinex Dm 30-600 Mg Xr12h-tab (Dextromethorphan-guaifenesin) .Marland Kitchen... 1 every 12 hours as needed    Cvs Saline Nasal Spray 0.65 % Soln (Saline) .Marland Kitchen... 2 puffs every 4 hours as needed    Afrin Nasal Spray 0.05 % Soln (Oxymetazoline hcl) .Marland Kitchen... 1 puff two times a day x5days    Tessalon Perles 100 Mg Caps (Benzonatate) .Marland Kitchen... 1 - 2 by mouth three times a day as needed    Septra Ds 800-160 Mg Tabs (Sulfamethoxazole-trimethoprim) .Marland Kitchen... 1po two times a day treat as above, f/u any worsening signs or symptoms   Problem # 2:  COPD (ICD-496)  Her updated medication list for this problem includes:    Symbicort 160-4.5 Mcg/act Aero (Budesonide-formoterol fumarate) .Marland Kitchen..Marland Kitchen Two puffs twice daily    Spiriva Handihaler 18 Mcg Caps (Tiotropium bromide monohydrate) ..... Inhale contents of 1 capsule once a day    Xopenex 1.25 Mg/44ml Nebu (Levalbuterol hcl) ..... One every 4 hours as needed    Xopenex Hfa 45 Mcg/act Aero (Levalbuterol tartrate) ..... Inhale 2 puffs every 4 hours as needed  stable overall by hx and exam, ok to continue meds/tx as is   Problem # 3:  HYPERTENSION (ICD-401.9)  Her updated medication list for this problem includes:    Furosemide 40 Mg Tabs (Furosemide) .Marland Kitchen... Take 1 tablet by mouth once a day  and as needed for excessive swelling    Exforge 10-320 Mg Tabs (Amlodipine besylate-valsartan) .Marland Kitchen... 1tab by mouth once daily borderline low today but asympt - most likely situaaitonal, ok to cont meds but take half for next 3 days exforge; call for any worsening s/s  Complete Medication List: 1)  Furosemide 40 Mg Tabs (Furosemide) .... Take 1 tablet by mouth once a day and as needed for excessive swelling 2)  Coumadin 7.5 Mg Tabs (Warfarin sodium) .... Take asd 1 tablet by mouth once a day 3)  Klor-con 10 10 Meq Tbcr (Potassium chloride) .... Take 2 tabs every am and 1 tab at bedtime 4)  Fluoxetine Hcl 40 Mg Caps (Fluoxetine hcl) .... Take 1 tablet by mouth once a day 5)  Omeprazole 20 Mg Cpdr (Omeprazole) .Marland Kitchen.. 1 by mouth 30 min before 1st meal 6)  Rythmol Sr 325 Mg Cp12 (Propafenone hcl) .... Two times a day 7)  Veramyst 27.5 Mcg/spray Susp (Fluticasone furoate) .... One spray two times a day 8)  Symbicort 160-4.5 Mcg/act Aero (Budesonide-formoterol fumarate) .... Two puffs twice daily 9)  Exforge 10-320 Mg Tabs (Amlodipine besylate-valsartan) .Marland Kitchen.. 1tab by mouth once daily 10)  Spiriva Handihaler 18 Mcg Caps (Tiotropium bromide monohydrate) .... Inhale contents of 1 capsule once a day 11)  Oxygen 2l Liquid  .... Continuous 12)  Glucosamine-chondroitin Caps (Glucosamine-chondroit-vit c-mn) .... Take 1 capsule by mouth once a day 13)  Calcium Carbonate-vitamin D 600-400 Mg-unit Tabs (Calcium carbonate-vitamin d) .... Take 1 tablet by mouth two times a day 14)  Vitamin D 1000 Unit Tabs (Cholecalciferol) .... Take 1 tablet by mouth once a day 15)  Forteo 600 Mcg/2.28ml Soln (Teriparatide (recombinant)) .... 20 micrograms subcutaneously once daily  16)  Xopenex 1.25 Mg/54ml Nebu (Levalbuterol hcl) .... One every 4 hours as needed 17)  Xopenex Hfa 45 Mcg/act Aero (Levalbuterol tartrate) .... Inhale 2 puffs every 4 hours as needed 18)  Tylenol Arthritis Pain 650 Mg Cr-tabs (Acetaminophen) .... Per bottle 19)  Mucinex Dm 30-600 Mg Xr12h-tab (Dextromethorphan-guaifenesin) .Marland Kitchen.. 1 every 12 hours as needed 20)  Tramadol Hcl 50 Mg Tabs (Tramadol hcl) .... Take 1-2 tablet by mouth every 4 hours as  needed for cough 21)  Fexofenadine Hcl 180 Mg Tabs (Fexofenadine hcl) .... Take 1 tablet by mouth once a day 22)  Cvs Saline Nasal Spray 0.65 % Soln (Saline) .... 2 puffs every 4 hours as needed 23)  Afrin Nasal Spray 0.05 % Soln (Oxymetazoline hcl) .Marland Kitchen.. 1 puff two times a day x5days 24)  Tessalon Perles 100 Mg Caps (Benzonatate) .Marland Kitchen.. 1 - 2 by mouth three times a day as needed 25)  Septra Ds 800-160 Mg Tabs (Sulfamethoxazole-trimethoprim) .Marland Kitchen.. 1po two times a day  Patient Instructions: 1)  Please take all new medications as prescribed 2)  Continue all previous medications as before this visit  3)  please take HALF of the Exforge for the next 3 days;  you can continue to do this after the 3 days if the blood pressure first thing in the morning is less than 120 on the top number 4)  call if you have worsening dizziness or weakness, fever or shortness of breath oor wheezing  5)  Please schedule a follow-up appointment as needed. Prescriptions: TESSALON PERLES 100 MG CAPS (BENZONATATE) 1 - 2 by mouth three times a day as needed  #60 x 1   Entered and Authorized by:   Corwin Levins MD   Signed by:   Len Blalock  Deyci Gesell MD on 09/11/2008   Method used:   Print then Give to Patient   RxID:   [id number] SEPTRA DS 800-160 MG TABS (SULFAMETHOXAZOLE-TRIMETHOPRIM) 1po two times a day  #20 x 0   Entered and Authorized by:   Corwin Levins MD   Signed by:   Corwin Levins MD on 09/11/2008   Method used:   Print then Give to Patient   RxID:   [id number]

## 2010-02-10 NOTE — Miscellaneous (Signed)
  Clinical Lists Changes  Problems: Added new problem of RHABDOMYOLYSIS (ICD-728.88)
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
## 2010-02-10 NOTE — Progress Notes (Signed)
  Phone Note Refill Request  on March 31, 2009 3:27 PM  Refills Requested: Medication #1:  EXFORGE 10-320 MG  TABS 1tab by mouth once daily   Dosage confirmed as above?Dosage Confirmed   Notes: [facility] Initial call taken by: Scharlene Gloss,  March 31, 2009 3:27 PM    Prescriptions: EXFORGE 10-320 MG  TABS (AMLODIPINE BESYLATE-VALSARTAN) 1tab by mouth once daily  #90 x 3   Entered by:   Scharlene Gloss   Authorized by:   Corwin Levins MD   Signed by:   Scharlene Gloss on 03/31/2009   Method used:   Faxed to ...       [facility]* (mail-order)             ,          Ph: [phone]       Fax: [phone]   RxID:   [id number]

## 2010-02-10 NOTE — Medication Information (Signed)
Summary: CCR/AMD  Anticoagulant Therapy  Managed by: Bethena Midget, RN, BSN Referring MD: Valera Castle MD PCP: Jonny Ruiz Indication 1: Pulmonary embolism (ICD-415.19) Lab Used: [facility] Site: [place] PT 19.5 INR POC 2.6 INR RANGE 2 - 3  Dietary changes: no    Health status changes: yes       Details: had flu seen Dr Jonny Ruiz on Sept 1st  Bleeding/hemorrhagic complications: no    Recent/future hospitalizations: no    Any changes in medication regimen? yes       Details: added Septra 800-160 1 po bid;not been taking some as bid. Also Tessalon 100mg  1-2 tid as needed.  Recent/future dental: yes     Details: thinks she has to have a tooth pulled going to see dentist  Any missed doses?: no       Is patient compliant with meds? yes       Current Medications (verified): 1)  Furosemide 40 Mg  Tabs (Furosemide) .... Take 1 Tablet By Mouth Once A Day and As Needed For Excessive Swelling 2)  Coumadin 7.5 Mg Tabs (Warfarin Sodium) .... Take Asd 1 Tablet By Mouth Once A Day 3)  Klor-Con 10 10 Meq Tbcr (Potassium Chloride) .... Take 2 Tabs Every Am and 1 Tab At Bedtime 4)  Fluoxetine Hcl 40 Mg Caps (Fluoxetine Hcl) .... Take 1 Tablet By Mouth Once A Day 5)  Omeprazole 20 Mg Cpdr (Omeprazole) .Marland Kitchen.. 1 By Mouth 30 Min Before 1st Meal 6)  Rythmol Sr 325 Mg Cp12 (Propafenone Hcl) .... Two Times A Day 7)  Veramyst 27.5 Mcg/spray  Susp (Fluticasone Furoate) .... One Spray Two Times A Day 8)  Symbicort 160-4.5 Mcg/act  Aero (Budesonide-Formoterol Fumarate) .... Two Puffs Twice Daily 9)  Exforge 10-320 Mg  Tabs (Amlodipine Besylate-Valsartan) .Marland Kitchen.. 1tab By Mouth Once Daily 10)  Spiriva Handihaler 18 Mcg  Caps (Tiotropium Bromide Monohydrate) .... Inhale Contents of 1 Capsule Once A Day 11)  Oxygen 2l Liquid .... Continuous 12)  Glucosamine-Chondroitin   Caps (Glucosamine-Chondroit-Vit C-Mn) .... Take 1 Capsule By Mouth Once A Day  13)  Calcium Carbonate-Vitamin D 600-400 Mg-Unit  Tabs (Calcium Carbonate-Vitamin D) .... Take 1 Tablet By Mouth Two Times A Day 14)  Vitamin D 1000 Unit Tabs (Cholecalciferol) .... Take 1 Tablet By Mouth Once A Day 15)  Forteo 600 Mcg/2.77ml Soln (Teriparatide (Recombinant)) .... 20 Micrograms Subcutaneously Once Daily 16)  Xopenex 1.25 Mg/26ml  Nebu (Levalbuterol Hcl) .... One Every 4 Hours As Needed 17)  Xopenex Hfa 45 Mcg/act  Aero (Levalbuterol Tartrate) .... Inhale 2 Puffs Every 4 Hours As Needed 18)  Tylenol Arthritis Pain 650 Mg Cr-Tabs (Acetaminophen) .... Per Bottle 19)  Mucinex Dm 30-600 Mg Xr12h-Tab (Dextromethorphan-Guaifenesin) .Marland Kitchen.. 1 Every 12 Hours As Needed 20)  Tramadol Hcl 50 Mg  Tabs (Tramadol Hcl) .... Take 1-2 Tablet By Mouth Every 4 Hours As  Needed For Cough 21)  Fexofenadine Hcl 180 Mg Tabs (Fexofenadine Hcl) .... Take 1 Tablet By Mouth Once A Day 22)  Cvs Saline Nasal Spray 0.65 %  Soln (Saline) .... 2 Puffs Every 4 Hours As Needed 23)  Afrin Nasal Spray 0.05 % Soln (Oxymetazoline Hcl) .Marland Kitchen.. 1 Puff Two Times A Day X5days 24)  Tessalon Perles 100 Mg Caps (Benzonatate) .Marland Kitchen.. 1 - 2 By Mouth Three Times A Day As Needed 25)  Septra Ds 800-160 Mg Tabs (Sulfamethoxazole-Trimethoprim) .Marland Kitchen.. 1po Two Times A Day  Allergies (verified): 1)  ! Pcn  Anticoagulation Management History:  The patient is taking warfarin and comes in today for a routine follow up visit.  Negative risk factors for bleeding include an age less than 26 years old.  The bleeding index is 'low risk'.  Positive CHADS2 values include History of HTN.  Negative CHADS2 values include Age > 93 years old.  The start date was 06/13/2008.  Her last INR was 3.9 RATIO.  Prothrombin time is 19.5.  INR POC: 2.6.    Anticoagulation Management Assessment/Plan:       The patient's current anticoagulation dose is Coumadin 7.5 mg tabs: Take asd 1 tablet by mouth once a day.  The target INR is 2 - 3.  The next INR is due 09/24/2008.  Results were reviewed/authorized by Bethena Midget, RN, BSN.  She was notified by [facility].         Prior Anticoagulation Instructions: 11.25mg  qd/7.5mg  sun/th  Current Anticoagulation Instructions: The patient is to continue with the same dose of coumadin.  This dosage includes: 7.5mg  on Sun and Thursday; 11.25mg  on Mon, Tues, Wed, Fri, and Saturday. We will see you back in 1 week.  Per Elmarie Shiley

## 2010-02-10 NOTE — Progress Notes (Signed)
Summary: HYDRALAZINE causing cough  Phone Note Call from Patient Call back at Home Phone [phone]   Caller: [phone] Call For: dr Jonny Ruiz Summary of Call: per pt call dr Jonny Ruiz prescribe HYDRALAZINE HCL 10 MG pt states the medication is causing her to cough  for 2 nights , only took one pill... please advise on what to do, pt states she cant continue to take the medication. Initial call taken by: Shelbie Proctor,  August 31, 2007 2:45 PM  Follow-up for Phone Call        ok to cont the med since hydralazine does NOT have anything to do with cough; need to consider some other cause Follow-up by: Corwin Levins MD,  August 31, 2007 2:48 PM  Additional Follow-up for Phone Call Additional follow up Details #1::        September 01, 2007 10:34 AM called pt to inform  spoke with pt made aware Additional Follow-up by: Shelbie Proctor,  September 01, 2007 10:34 AM

## 2010-02-10 NOTE — Miscellaneous (Signed)
Summary: BONE DENSITY  Clinical Lists Changes  Orders: Added new Test order of T-Bone Densitometry (77080) - Signed 

## 2010-02-10 NOTE — Medication Information (Signed)
Summary: CCR/AMD  Anticoagulant Therapy  Managed by: Charlena Cross, RN, BSN Referring MD: Valera Castle MD PCP: Corwin Levins MD Supervising MD: Gala Romney MD, Reuel Boom Indication 1: Pulmonary embolism (ICD-415.19) Lab Used: [facility] Site: [place] INR POC 3.6 INR RANGE 2 - 3  Dietary changes: no    Health status changes: no    Bleeding/hemorrhagic complications: no    Recent/future hospitalizations: no    Any changes in medication regimen? no    Recent/future dental: no  Any missed doses?: no       Is patient compliant with meds? yes       Allergies: 1)  ! Pcn  Anticoagulation Management History:      The patient is taking warfarin and comes in today for a routine follow up visit.  Negative risk factors for bleeding include an age less than 49 years old.  The bleeding index is 'low risk'.  Positive CHADS2 values include History of HTN.  Negative CHADS2 values include Age > 43 years old.  The start date was 06/13/2008.  Her last INR was 5.6.  Anticoagulation responsible provider: Aliha Diedrich MD, Reuel Boom.  INR POC: 3.6.    Anticoagulation Management Assessment/Plan:      The patient's current anticoagulation dose is Coumadin 7.5 mg tabs: Take asd 1 tablet by mouth once a day.  The target INR is 2 - 3.  The next INR is due 05/12/2009.  Anticoagulation instructions were given to patient.  Results were reviewed/authorized by Charlena Cross, RN, BSN.  She was notified by Charlena Cross, RN, BSN.         Prior Anticoagulation Instructions: The patient is to continue with the same dose of coumadin.  This dosage includes: coumadin 11.25 mg daily with 7.5 mg on Sun Wed Fri  Current Anticoagulation Instructions: no coumadin today then coumadin 11.25 mg daily with 7.5 mg on S Wed Fri

## 2010-02-10 NOTE — Assessment & Plan Note (Signed)
Summary: numbness in fingertips, chest congestion///JJ  Medications Added LEVAQUIN 750 MG  TABS (LEVOFLOXACIN) 1 by mouth once daily        PCP:  Jonny Ruiz  Chief Complaint:  4 day of nasal congestion, cough, and green mucus.  History of Present Illness: 60 yo white female former smoker with documented COPD with minimum asthmatic component debilitated as much by obesityas by airflow obstruction. Presents for an acute office visit for 4 days of cough, congestion, nasal drainage, green mucus. Started her rhinitis and cough regimen with mucinex dm, zyrtec, afrin/saline without much improvement. Denies chest pain, dyspnea, orthopnea, hemoptysis, fever, n/v/d, edema.      Prior Medication List:  LASIX 40 MG TABS (FUROSEMIDE) Take 1 tablet by mouth once a day COUMADIN 7.5 MG TABS (WARFARIN SODIUM) Take 1 tablet by mouth once a day, 11mg  on Mondays KLOR-CON 10 10 MEQ TBCR (POTASSIUM CHLORIDE) Take 1 tablet by mouth once a day FLUOXETINE HCL 40 MG CAPS (FLUOXETINE HCL) Take 1 tablet by mouth once a day OMEPRAZOLE 20 MG  CPDR (OMEPRAZOLE) Take 1 capsule by mouth once a day RYTHMOL SR 325 MG CP12 (PROPAFENONE HCL) two times a day VERAMYST 27.5 MCG/SPRAY  SUSP (FLUTICASONE FUROATE) one spray two times a day SYMBICORT 80-4.5 MCG/ACT  AERO (BUDESONIDE-FORMOTEROL FUMARATE) two puffs two times a day AMLODIPINE BESYLATE 10 MG  TABS (AMLODIPINE BESYLATE) 1/2 tab by mouth once daily DIOVAN 160 MG  TABS (VALSARTAN) 2 tabs by mouth once daily SPIRIVA HANDIHALER 18 MCG  CAPS (TIOTROPIUM BROMIDE MONOHYDRATE) Inhale contents of 1 capsule once a day * OXYGEN 2L continuous XOPENEX 1.25 MG/3ML  NEBU (LEVALBUTEROL HCL) one every 6 hours as needed XOPENEX HFA 45 MCG/ACT  AERO (LEVALBUTEROL TARTRATE) inhale 2 puffs every 4 hours as needed TYLENOL 325 MG  TABS (ACETAMINOPHEN) per bottle MUCINEX DM 30-600 MG  TB12 (DEXTROMETHORPHAN-GUAIFENESIN) 1-2 every 12 hours as needed  ULTRAM 50 MG  TABS (TRAMADOL HCL) one every four hours as needed ZYRTEC ALLERGY 10 MG  TABS (CETIRIZINE HCL) one tab at bedtime as needed [facility] SALINE NASAL SPRAY 0.65 %  SOLN (SALINE) 2 puffs every 4 hours as needed AFRIN NASAL SPRAY 0.05 %  SOLN (OXYMETAZOLINE HCL) 1 puff two times a day x5 days FUROSEMIDE 20 MG  TABS (FUROSEMIDE) 1 extra daily as needed   Current Allergies (reviewed today): ! PCN  Past Medical History:    Reviewed history from 03/01/2007 and no changes required:               EDEMA (ICD-782.3)       OBSTRUCTIVE CHRONIC BRONCHITIS WITH EXACERBATION (ICD-491.21)       ACUTE AND CHRONIC RESPIRATORY FAILURE (ICD-518.84)       MORBID OBESITY (ICD-278.01)       DEEP VENOUS THROMBOPHLEBITIS, HX OF (ICD-V12.52)       COPD (ICD-496)       DEPRESSION (ICD-311)       OSTEOPOROSIS (ICD-733.00)       HYPERTENSION (ICD-401.9)                   Family History:    Reviewed history from 01/19/2007 and no changes required:       asthma and brothers and son.  Heart disease in mother.  Throat cancer in brother.  Social History:    Reviewed history from 03/01/2007 and no changes required:       Patient states former smoker quit smoking in 2003   Risk Factors: Tobacco use:  quit    Year quit:  1998    Pack-years:  56yrs, 1ppd  Colonoscopy History:    Date of Last Colonoscopy:  02/13/2004  Mammogram History:    Date of Last Mammogram:  01/11/1998  PAP Smear History:    Date of Last PAP Smear:  01/11/1998   Review of Systems      See HPI   Vital Signs:  Patient Profile:   60 Years Old Female Height:     66 inches Weight:      277 pounds O2 Sat:      98 % O2 treatment:    2L pulsing Temp:     98.0 degrees F oral Pulse rate:   98 / minute BP sitting:   130 / 96  (left arm) Cuff size:   large  Vitals Entered By: Boone Master CNA (March 23, 2007 10:48 AM)             Is Patient Diabetic? No Comments Medications reviewed with patient  ..................................................................Marland KitchenBoone Master CNA  March 23, 2007 10:48 AM      Physical Exam  No acute distress with stable vital signs. HEENT:  Nasal pale, nontender.   Oropharynx is clear. LUNG FIELDS:  Coarse breath sounds with scattered rhonchi. HEART:  There is a regular rhythm without murmur, gallop, rub. ABDOMEN:  Soft, non-tender EXTREMITIES:  Warm without calf tenderness, cyanosis, clubbing, edema.        Impression & Recommendations:  Problem # 1:  OBSTRUCTIVE CHRONIC BRONCHITIS WITH EXACERBATION (ICD-491.21) Levaquin 750mg  once daily for 7days Mucinex DM two times a day as needed cough/congestion Zyrtec 10mg  at bedtime as needed drainage Saline and Afrin 2 puff two times a day for 5 days.  Tramadol for severe cough/breakthrough cough every 4 hr as needed  Please contact office for sooner follow up if symptoms do not improve or worsen  Orders: Est. Patient Level III (06301)   Medications Added to Medication List This Visit: 1)  Levaquin 750 Mg Tabs (Levofloxacin) .Marland Kitchen.. 1 by mouth once daily   Patient Instructions: 1)  Levaquin 750mg  once daily for 7days 2)  Mucinex DM two times a day as needed cough/congestion 3)  Zyrtec 10mg  at bedtime as needed drainage 4)  Saline and Afrin 2 puff two times a day for 5 days.  5)  Tramadol for severe cough/breakthrough cough every 4 hr as needed  6)  Please contact office for sooner follow up if symptoms do not improve or worsen     Prescriptions: LEVAQUIN 750 MG  TABS (LEVOFLOXACIN) 1 by mouth once daily  #7 x 0   Entered and Authorized by:   Rubye Oaks NP   Signed by:   Nikitia Asbill NP on 03/23/2007   Method used:   Electronically sent to ...       [facility] [address] [phone]*       [address]       [place], [place]  [address]       Ph: [phone] or [phone]       Fax: [phone]   RxID:   [id number]  ]

## 2010-02-10 NOTE — Assessment & Plan Note (Signed)
Summary: Pulmonary/ fu copd/hbp   Primary Provider/Referring Provider:  Jonny Ruiz  CC:  2 wk followup.  Pt c/o pressure in right ear.  Also c/o sneezing and runny nose x 2 days.  Zyrtec helps.  Breathing has been okay.  Marland Kitchen  History of Present Illness: 48 yowf last smoked 2003 with   COPD with minimum asthmatic component with an FEV1 of 55%, a ratio of 53%, and a diffusing capacity 51% documented 07/25/06.  On 02 24 hours per day at 2lpm  6/09  weighed 271 and it was felt she was as debilitated as much by obesity as by airflow obstruction. baseline = when well = walk to mother in law's house but stops at least once because it's uphill and frequently requiring rescue rx.  January 22, 2008 --returns post hospital , admitted 12/29-01/15/07 for CAP -LLL , tx with Avelox (tot. 10 d)  Since discharge, doing better not back to baseline. -CXR improved but small residual density.   February 07, 2008--Complains that she has sore throat, hoarseness, cough, congestion, watery eyes, DOE, wheezing over last 3 days. Since last visit was improving and returned back to baseline  but 3 days ago, cough started again. green mucus w/ nasal discharge --Levaquin x 5 days.   March 01, 2008--Presents for a post hospitalization follow up. Admitted 02/23/08 for post op respiratory difficulties. she underwent  left Wrist surgery, post op dyspnea, cough/wheezing. Admitted w/ aggressive resp toilet and IV steroids. Discharged. 02/27/08 improved.   March 20, 2008 ov for fu cxr with no change in chronic doe and minimal cough. Pt denies any significant sore throat, nasal congestion or excess secretions, fever, chills, sweats, unintended wt loss, pleuritic or exertional cp, orthopnea pnd or leg swelling.  Pt also denies any obvious fluctuation in symptoms with weather or environmental change or other alleviating or aggravating factors.       Current Medications (verified):  1)  Furosemide 40 Mg  Tabs (Furosemide) .... Take 1 Tablet By Mouth Once A Day 2)  Coumadin 7.5 Mg Tabs (Warfarin Sodium) .... Take Asd 1 Tablet By Mouth Once A Day 3)  Klor-Con 10 10 Meq Tbcr (Potassium Chloride) .... Take 2 Tablets By Mouth Once A Day 4)  Fluoxetine Hcl 40 Mg Caps (Fluoxetine Hcl) .... Take 1 Tablet By Mouth Once A Day 5)  [facility]vs Omeprazole 20 Mg Tbec (Omeprazole) .Marland Kitchen.. 1 Tablet By Mouth 30 Minutes Before The First Meal 6)  Rythmol Sr 325 Mg Cp12 (Propafenone Hcl) .... Two Times A Day 7)  Veramyst 27.5 Mcg/spray  Susp (Fluticasone Furoate) .... One Spray Two Times A Day 8)  Symbicort 160-4.5 Mcg/act  Aero (Budesonide-Formoterol Fumarate) .... Two Puffs Twice Daily 9)  Exforge 10-320 Mg  Tabs (Amlodipine Besylate-Valsartan) .Marland Kitchen.. 1tab By Mouth Once Daily 10)  Spiriva Handihaler 18 Mcg  Caps (Tiotropium Bromide Monohydrate) .... Inhale Contents of 1 Capsule Once A Day 11)  Oxygen 2l .... Continuous 12)  Glucosamine-Chondroitin   Caps (Glucosamine-Chondroit-Vit C-Mn) .... Take 1 Capsule By Mouth Once A Day 13)  Calcium Carbonate-Vitamin D 600-400 Mg-Unit  Tabs (Calcium Carbonate-Vitamin D) .... Take 1 Tablet By Mouth Two Times A Day 14)  Vitamin D 1000 Unit Tabs (Cholecalciferol) .... Take 1 Tablet By Mouth Once A Day 15)  Forteo 600 Mcg/2.11ml Soln (Teriparatide (Recombinant)) .... 20 Micrograms Subcutaneously Once Daily 16)  Hydralazine Hcl 10 Mg Tabs (Hydralazine Hcl) .Marland Kitchen.. 1po Three Times A Day 17)  Xopenex 1.25 Mg/53ml  Nebu (Levalbuterol Hcl) .... One Every  6 Hours As Needed 18)  Xopenex Hfa 45 Mcg/act  Aero (Levalbuterol Tartrate) .... Inhale 2 Puffs Every 4 Hours As Needed 19)  Tylenol 325 Mg  Tabs (Acetaminophen) .... Per Bottle 20)  Tramadol Hcl 50 Mg  Tabs (Tramadol Hcl) .... Take 1 Tablet By Mouth Every 4 Hours As Needed 21)  Zyrtec Allergy 10 Mg  Tabs (Cetirizine Hcl) .... One Tab At Bedtime As Needed  22)  Cvs Saline Nasal Spray 0.65 %  Soln (Saline) .... 2 Puffs Every 4 Hours As Needed 23)  Afrin Nasal Spray 0.05 % Soln (Oxymetazoline Hcl) .... Use Asd 5 X Per Day  Allergies (verified): 1)  ! Pcn  Past History  Past Medical History: CHRONIC RHINITIS (ICD-472.0) EDEMA (ICD-782.3) ACUTE AND CHRONIC RESPIRATORY FAILURE (ICD-518.84) MORBID OBESITY (ICD-278.01)     - Target wt  =   179  for BMI < 30      - Referred back to nutrition December 18, 2007  DEEP VENOUS THROMBOPHLEBITIS, HX OF (ICD-V12.52) COPD (ICD-496)     - Pft's 07/25/06 FEV1 55% ratio 53, DLC0 51% DEPRESSION (ICD-311) OSTEOPOROSIS (ICD-733.00) last BMD 4/08 -2.4, intolerant of bisphosphonates  HYPERTENSION (ICD-401.9) Atrial fibrillation - paroxysmal Anticoagulation therapy Pulmonary embolism, hx of neg stress myoview 10/07 Anxiety glucose intolerance - on steroids Hyperlipidemia (12/18/2007)  Family History: asthma and brothers and son.   Heart disease in mother.   Throat cancer in brother. (07/07/2007)  Risk Factors: Alcohol Use: N/A >5 drinks/d w/in last 3 months: N/A Caffeine Use: N/A Diet: N/A Exercise: N/A  Vital Signs:  Patient profile:   60 year old female Height:      65 inches Weight:      284.13 pounds BMI:     47.45 Temp:     98.2 degrees F Pulse rate:   50 / minute BP sitting:   92 / 62  (right arm) Cuff size:   large  Vitals Entered By: Vernie Murders (March 20, 2008 10:53 AM)  O2 Sat at Rest %:  98 O2 Flow:  2 liters pulsed CC: 2 wk followup.  Pt c/o pressure in right ear.  Also c/o sneezing and runny nose x 2 days.  Zyrtec helps.  Breathing has been okay.     Physical Exam  Additional Exam:  Ambulatory healthy appearing in no acute distress. Afeb with normal vital signs    283 January 09, 2008 >.279 January 22, 2008 >>280 February 07, 2008 > 284 March 20, 2008  HEENT: nl dentition, turbinates, and orophanx. Nl external ear canals without cough reflex  Neck without JVD/Nodes/TM Lungs coarse BS w/ no wheezing RRR no m/r/g Abd soft and benign with nl excursion in the supine position. No bruits or organomegaly Ext warm without calf tenderness, cyanosis clubbing or edema Skin warm and dry without lesions     CXR  Procedure date:  03/20/2008  Findings:      no acute changes  Pulmonary Function Test Height (in.): 65 Gender: Female  Impression & Recommendations:  Problem # 1:  COPD (ICD-496) has recovered back to baseline from recent exacerbation with no changes on chest x-ray.     Each maintenance medication was reviewed in detail including most importantly the difference between maintenance and as needed and under what circumstances the prns are to be used.   This was done in the context of a medication calendar review which provided the patient with a user-friendly unambiguous mechanism for medication administration and reconciliation. It is critical that  this be shown to every doctor he sees for modification during the office visit if necessary so the patient can use it as a working document.    Problem # 2:  HYPERTENSION (ICD-401.9) over rx.  See instructions for specific recommendations  The following medications were removed from the medication list:    Hydralazine Hcl 10 Mg Tabs (Hydralazine hcl) .Marland Kitchen... 1po three times a day Her updated medication list for this problem includes:    Furosemide 40 Mg Tabs (Furosemide) .Marland Kitchen... Take 1 tablet by mouth once a day    Exforge 10-320 Mg Tabs (Amlodipine besylate-valsartan) .Marland Kitchen... 1tab by mouth once daily  Complete Medication List: 1)  Furosemide 40 Mg Tabs (Furosemide) .... Take 1 tablet by mouth once a day 2)  Coumadin 7.5 Mg Tabs (Warfarin sodium) .... Take asd 1 tablet by mouth once a day 3)  Klor-con 10 10 Meq Tbcr (Potassium chloride) .... Take 2 tablets by mouth once a day 4)  Fluoxetine Hcl 40 Mg Caps (Fluoxetine hcl) .... Take 1 tablet by mouth once a day  5)  Cvs Omeprazole 20 Mg Tbec (Omeprazole) .Marland Kitchen.. 1 tablet by mouth 30 minutes before the first meal 6)  Rythmol Sr 325 Mg Cp12 (Propafenone hcl) .... Two times a day 7)  Veramyst 27.5 Mcg/spray Susp (Fluticasone furoate) .... One spray two times a day 8)  Symbicort 160-4.5 Mcg/act Aero (Budesonide-formoterol fumarate) .... Two puffs twice daily 9)  Exforge 10-320 Mg Tabs (Amlodipine besylate-valsartan) .Marland Kitchen.. 1tab by mouth once daily 10)  Spiriva Handihaler 18 Mcg Caps (Tiotropium bromide monohydrate) .... Inhale contents of 1 capsule once a day 11)  Oxygen 2l  .... Continuous 12)  Glucosamine-chondroitin Caps (Glucosamine-chondroit-vit c-mn) .... Take 1 capsule by mouth once a day 13)  Calcium Carbonate-vitamin D 600-400 Mg-unit Tabs (Calcium carbonate-vitamin d) .... Take 1 tablet by mouth two times a day 14)  Vitamin D 1000 Unit Tabs (Cholecalciferol) .... Take 1 tablet by mouth once a day 15)  Forteo 600 Mcg/2.83ml Soln (Teriparatide (recombinant)) .... 20 micrograms subcutaneously once daily 16)  Xopenex 1.25 Mg/66ml Nebu (Levalbuterol hcl) .... One every 6 hours as needed 17)  Xopenex Hfa 45 Mcg/act Aero (Levalbuterol tartrate) .... Inhale 2 puffs every 4 hours as needed 18)  Tylenol 325 Mg Tabs (Acetaminophen) .... Per bottle 19)  Tramadol Hcl 50 Mg Tabs (Tramadol hcl) .... Take 1 tablet by mouth every 4 hours as needed 20)  Zyrtec Allergy 10 Mg Tabs (Cetirizine hcl) .... One tab at bedtime as needed 21)  [facility]vs Saline Nasal Spray 0.65 % Soln (Saline) .... 2 puffs every 4 hours as needed 22)  Afrin Nasal Spray 0.05 % Soln (Oxymetazoline hcl) .... Use asd 5 x per day  Other Orders: T-2 View CXR, Same Day (71020.5TC) Est. Patient Level III (16109)  Patient Instructions: 1)  Stop hydralazine 2)  Please schedule a follow-up appointment in 6 weeks, sooner if needed to see [name] for new copy of your med calendar  3)  See calendar for specific medication instructions and bring it back for each and every office visit for every healthcare provider you see.  Without it,  you may not receive the best quality medical care that we feel you deserve.  4)  The medication list was reviewed and reconciled.  All changed / newly prescribed medications were explained.  A complete medication list was provided to the patient / caregiver.  5)  ADD MAKE SURE SHE KEPT NUTRITION APPT WITH FOOD DIARY

## 2010-02-10 NOTE — Medication Information (Signed)
Summary: CCR/AMD  Anticoagulant Therapy  Managed by: Cloyde Reams, RN, BSN Referring MD: Valera Castle MD PCP: Corwin Levins MD Supervising MD: Mariah Milling Indication 1: Pulmonary embolism (ICD-415.19) Lab Used: [facility] Site: [place] INR POC 2.0 INR RANGE 2 - 3  Dietary changes: no    Health status changes: no    Bleeding/hemorrhagic complications: yes       Details: Incr bruising.  Recent/future hospitalizations: no    Any changes in medication regimen? no    Recent/future dental: no  Any missed doses?: no       Is patient compliant with meds? yes       Allergies: 1)  ! Pcn  Anticoagulation Management History:      The patient is taking warfarin and comes in today for a routine follow up visit.  Negative risk factors for bleeding include an age less than 65 years old.  The bleeding index is 'low risk'.  Positive CHADS2 values include History of HTN.  Negative CHADS2 values include Age > 11 years old.  The start date was 06/13/2008.  Her last INR was 5.6.  Anticoagulation responsible provider: gollan.  INR POC: 2.0.  Cuvette Lot#: [id number].  Exp: 09/2010.    Anticoagulation Management Assessment/Plan:      The patient's current anticoagulation dose is Coumadin 7.5 mg tabs: Take asd 1 tablet by mouth once a day.  The target INR is 2 - 3.  The next INR is due 07/28/2009.  Anticoagulation instructions were given to patient.  Results were reviewed/authorized by Cloyde Reams, RN, BSN.  She was notified by Cloyde Reams RN.         Prior Anticoagulation Instructions: INR 2.9  Take 0.5 tablet today. Continue taking 1.5 tablet daily except for 1 tablet Sunday, Wednesday and Friday. Recheck in 2 weeks.   Current Anticoagulation Instructions: INR 2.0  Continue on same dosage 1.5 tablets daily except 1 tablet on Sundays, Wednesdays, and Fridays.  Recheck in 3 weeks.

## 2010-02-10 NOTE — Medication Information (Signed)
Summary: CCR/AMD  Anticoagulant Therapy  Managed by: Charlena Cross, RN, BSN Referring MD: Valera Castle MD PCP: Gwen Pounds MD: Graciela Husbands MD, Viviann Spare Indication 1: Pulmonary embolism (ICD-415.19) Lab Used: [facility] Site: [place] INR RANGE 2 - 3  Dietary changes: no    Health status changes: yes       Details: bronchitis  Bleeding/hemorrhagic complications: no    Recent/future hospitalizations: no    Any changes in medication regimen? yes       Details: took prednisone and avelox for bronchitis  Recent/future dental: no  Any missed doses?: no       Is patient compliant with meds? yes       Allergies: 1)  ! Pcn  Anticoagulation Management History:      The patient is taking warfarin and comes in today for a routine follow up visit.  Negative risk factors for bleeding include an age less than 16 years old.  The bleeding index is 'low risk'.  Positive CHADS2 values include History of HTN.  Negative CHADS2 values include Age > 2 years old.  The start date was 06/13/2008.  Her last INR was 3.9 RATIO and today's INR is 5.6.  Anticoagulation responsible provider: Graciela Husbands MD, Viviann Spare.    Anticoagulation Management Assessment/Plan:      The patient's current anticoagulation dose is Coumadin 7.5 mg tabs: Take asd 1 tablet by mouth once a day.  The target INR is 2 - 3.  The next INR is due 02/17/2009.  Anticoagulation instructions were given to patient.  Results were reviewed/authorized by Charlena Cross, RN, BSN.  She was notified by Charlena Cross, RN, BSN.         Prior Anticoagulation Instructions: The patient is to continue with the same dose of coumadin.  This dosage includes: coumadin 11.25 mg daily with 7.5 mf on Sun Wed Fri  Current Anticoagulation Instructions: no coumadin today or tomorrow then resume dose of coumadin 11.25mg  daily with 7.5 mg on Sun Wed Fri

## 2010-02-10 NOTE — Medication Information (Signed)
Summary: rov/ewj  Anticoagulant Therapy  Managed by: Cloyde Reams, RN, BSN Referring MD: Valera Castle MD PCP: Corwin Levins MD Supervising MD: Mariah Milling Indication 1: Pulmonary embolism (ICD-415.19) Lab Used: [facility] Site: [place] INR POC 3.1 INR RANGE 2 - 3  Dietary changes: no    Health status changes: no    Bleeding/hemorrhagic complications: no    Recent/future hospitalizations: no    Any changes in medication regimen? yes       Details: D/C Cymbalta and resumed Prozac  Recent/future dental: no  Any missed doses?: no       Is patient compliant with meds? yes       Allergies: 1)  ! Pcn 2)  * Cymbalta  Anticoagulation Management History:      The patient is taking warfarin and comes in today for a routine follow up visit.  Negative risk factors for bleeding include an age less than 75 years old.  The bleeding index is 'low risk'.  Positive CHADS2 values include History of HTN.  Negative CHADS2 values include Age > 20 years old.  The start date was 06/13/2008.  Her last INR was 5.6.  Anticoagulation responsible provider: Abrie Egloff.  INR POC: 3.1.  Cuvette Lot#: [id number].  Exp: 10/2010.    Anticoagulation Management Assessment/Plan:      The patient's current anticoagulation dose is Coumadin 7.5 mg tabs: Take asd 1 tablet by mouth once a day.  The target INR is 2 - 3.  The next INR is due 09/17/2009.  Anticoagulation instructions were given to patient.  Results were reviewed/authorized by Cloyde Reams, RN, BSN.  She was notified by Cloyde Reams RN.         Prior Anticoagulation Instructions: INR 3.1  Take 1/2 tablet today, then  resume same dosage 1.5 tablets daily except 1 tablet on Sundays, Wednesdays, and Fridays.  Recheck in 3 weeks.    Current Anticoagulation Instructions: INR 3.1  Take 1/2 tablet today, then resume dosage 1.5 tablets daily except 1 tablet on Sundays, Wednesdays and Fridays.  Recheck in 4 weeks.

## 2010-02-10 NOTE — Progress Notes (Signed)
Summary: Cymbalta side effects  Phone Note Call from Patient Call back at Home Phone [phone]   Summary of Call: Patient c/o trouble with muscles in her hands and legs due to change in Cymbalta. I called patient for more info and she states her fingers are beginning to "draw in" and her calves/legs are "locked up". Patient is out of the 30mg  samples and is currently on the 60mg  tabs x1 month.   patient also notes that prescriptions should go to [facility]. Initial call taken by: Lucious Groves CMA,  July 28, 2009 2:48 PM  Follow-up for Phone Call        I doubt very much this is from the cymbalta as this would be very unusual - ok to cont as is Follow-up by: Corwin Levins MD,  July 28, 2009 5:04 PM  Additional Follow-up for Phone Call Additional follow up Details #1::        left message on machine for pt to return my call. Margaret Pyle, CMA  July 29, 2009 8:32 AM   Pt informed and transferred to sch appt with JWJ Additional Follow-up by: Margaret Pyle, CMA,  July 29, 2009 10:07 AM

## 2010-02-10 NOTE — Medication Information (Signed)
Summary: rov/ewj  Anticoagulant Therapy  Managed by: Bethena Midget, RN, BSN Referring MD: Valera Castle MD PCP: Corwin Levins MD Supervising MD: Mariah Milling Indication 1: Pulmonary embolism (ICD-415.19) Lab Used: [facility] Site: [place] INR POC 3.2 INR RANGE 2 - 3  Dietary changes: no    Health status changes: yes       Details: Resp infection   Bleeding/hemorrhagic complications: no    Recent/future hospitalizations: no    Any changes in medication regimen? yes       Details: 11/27/09 started on levaquin 500mg  has 4 days left  Recent/future dental: no  Any missed doses?: no       Is patient compliant with meds? yes       Allergies: 1)  ! Pcn 2)  * Cymbalta  Anticoagulation Management History:      The patient is taking warfarin and comes in today for a routine follow up visit.  Negative risk factors for bleeding include an age less than 77 years old.  The bleeding index is 'low risk'.  Positive CHADS2 values include History of HTN.  Negative CHADS2 values include Age > 11 years old.  The start date was 06/13/2008.  Her last INR was 5.6.  Anticoagulation responsible provider: Donney Caraveo.  INR POC: 3.2.  Cuvette Lot#: [id number].  Exp: 12/2010.    Anticoagulation Management Assessment/Plan:      The patient's current anticoagulation dose is Coumadin 7.5 mg tabs: Take asd 1 tablet by mouth once a day.  The target INR is 2 - 3.  The next INR is due 12/24/2009.  Anticoagulation instructions were given to patient.  Results were reviewed/authorized by Bethena Midget, RN, BSN.  She was notified by Bethena Midget, RN, BSN.         Prior Anticoagulation Instructions: INR 2.3  Continue on same dosage 1 tablet daily except 1.5 tablets on Tuesdays, Thursdays, and Saturdays.  Recheck in 4 weeks.    Current Anticoagulation Instructions: INR 3.2 Skip today's dose then resume 1 pill everyday except 1 1/2 pills on Tuesdays, Thursdays and Saturdays. Recheck in 3  weeks.

## 2010-02-10 NOTE — Miscellaneous (Signed)
  Clinical Lists Changes  Allergies: Added new allergy or adverse reaction of * CYMBALTA

## 2010-02-10 NOTE — Progress Notes (Signed)
Summary: Please Advise?  Phone Note Call from Patient Call back at Home Phone [phone]   Caller: Patient Summary of Call: Pt left a message on triage stating that she was not contacted about last lab result. I called pt and advised that levels were better and she needed to continue to hold Cymbalta. Pt then stated that she is not on anything for her depression now and when she goes into her kitchen she can feel people watching her but she know that she is home alone. At this time pt began to cry.  Pt also said that she needs to be back on Prozac because she did not see people while on this (Per pt, she has been having these feeling x 4-5 days). I advised pt that her medications can be changed but if she is feeling this way she should go to the nearest ER for eval. Pt declined stating that if she goes outside right now she thinks she will "wonder off". I asked pt if she had anyone who can take her, a neighbor, child or spouse, she replied that there was no one available right now but she did not want to go, she just wants Dr. Jonny Ruiz to tell her about her medications? Please advise. Initial call taken by: Margaret Pyle, CMA,  August 08, 2009 10:30 AM  Follow-up for Phone Call        ok to re-start the prozac 40 mg - done per emr;  to ER for any suicidal ideation or hallucinations Follow-up by: Corwin Levins MD,  August 08, 2009 10:38 AM  Additional Follow-up for Phone Call Additional follow up Details #1::        Pt advised of MD advisement and agreed. Additional Follow-up by: Margaret Pyle, CMA,  August 08, 2009 10:45 AM    New/Updated Medications: FLUOXETINE HCL 40 MG CAPS (FLUOXETINE HCL) 1po once daily Prescriptions: FLUOXETINE HCL 40 MG CAPS (FLUOXETINE HCL) 1po once daily  #90 x 3   Entered and Authorized by:   Corwin Levins MD   Signed by:   Corwin Levins MD on 08/08/2009   Method used:   Electronically to        [facility]  [address] [phone]* (retail)       [address]       [place], [place]  [id number]       Ph: [phone] or [phone]       Fax: [phone]   RxID:   [id number]

## 2010-02-10 NOTE — Letter (Signed)
Summary: [facility]allum Duty Forms/[facility] Duty Forms/[place]   Imported By: Sherian Rein 07/22/2009 09:05:39  _____________________________________________________________________  External Attachment:    Type:   Image     Comment:   External Document

## 2010-02-10 NOTE — Progress Notes (Signed)
Summary: HEADACHE/DC FROM [facility] YESTERDAY/BP MEDS?  Phone Note Call from Patient Call back at [facility] Phone [phone]   Caller: Patient Reason for Call: Talk to Nurse Summary of Call: DISCHARGED FROM [facility] YESTERDAY.  WOKE UP THIS AM WITH A SEVERE HEADACHE. WAS TOLD TO DC ONE OF HER BP MEDS.  COULD THIS BE THE REASON FOR THE HA? Initial call taken by: Burnard Leigh,  June 17, 2008 8:45 AM  Follow-up for Phone Call        Pt will now be followed by our [facility].  Follow-up by: Bernita Raisin, RN, BSN,  June 17, 2008 9:57 AM  Additional Follow-up for Phone Call Additional follow up Details #1::        spoke with pt. and wil call Dr. Daleen Squibb. Additional Follow-up by: Charlena Cross, RN, BSN,  June 17, 2008 1:03 PM

## 2010-02-10 NOTE — Progress Notes (Signed)
Summary: Call back with meds given in ED  Phone Note Call from Patient Call back at Home Phone [phone]   Caller: Patient Call For: RN Summary of Call: Patient called RN back to let her know of the medications that were given to her in the emergency room that might affect her coumadin.  They are azithromycin adn oxycodone. Initial call taken by: [place],  June 12, 2009 11:03 AM  Follow-up for Phone Call        Called coumadin clinic for assistance with medication interactions.  Coumadin clinic advised that we check her inr next week when antibiotic is finished, no other recomendations made. Attempted to call pt LMOM TCB Benedict Needy, RN  June 12, 2009 4:04 PM   Talked to pt will be coming in to office 6/10 to have INR checked.  Informed her to call with any s/s of bleeding. Benedict Needy, RN  June 13, 2009 11:30 AM

## 2010-02-10 NOTE — Progress Notes (Signed)
Summary: should she continue med  Phone Note Call from Patient Call back at Home Phone [phone]   Caller: [phone] Call For: Corwin Levins MD Summary of Call: per Leroy Libman call requseting this rx be sent to [facility] for a 80 day supply 23)  Hydralazine Hcl 10 Mg Tabs (Hydralazine hcl) .Marland Kitchen.. 1po three times a day...need a 90 day supply with 3 refill. want  to know if she should take this on a regular basic . Initial call taken by: Shelbie Proctor,  January 29, 2008 10:01 AM  Follow-up for Phone Call        yes, this is a god idea - to [facility] for refill request Follow-up by: Corwin Levins MD,  January 29, 2008 12:57 PM      Prescriptions: HYDRALAZINE HCL 10 MG TABS (HYDRALAZINE HCL) 1po three times a day  #90 x 3   Entered by:   Payton Spark CMA   Authorized by:   Corwin Levins MD   Signed by:   Payton Spark CMA on 01/29/2008   Method used:   Print then Give to Patient   RxID:   [id number] EXFORGE 10-320 MG  TABS (AMLODIPINE BESYLATE-VALSARTAN) 1tab by mouth once daily  #90 x 3   Entered by:   Payton Spark CMA   Authorized by:   Corwin Levins MD   Signed by:   Payton Spark CMA on 01/29/2008   Method used:   Print then Give to Patient   RxID:   [id number] FLUOXETINE HCL 40 MG CAPS (FLUOXETINE HCL) Take 1 tablet by mouth once a day  #90 x 3   Entered by:   Payton Spark CMA   Authorized by:   Corwin Levins MD   Signed by:   Payton Spark CMA on 01/29/2008   Method used:   Print then Give to Patient   RxID:   [id number] KLOR-CON 10 10 MEQ TBCR (POTASSIUM CHLORIDE) Take 1 tablet by mouth once a day  #90 x 3   Entered by:   Payton Spark CMA   Authorized by:   Corwin Levins MD   Signed by:   Payton Spark CMA on 01/29/2008   Method used:   Print then Give to Patient   RxID:   [id number] COUMADIN 7.5 MG TABS (WARFARIN SODIUM) Take asd 1 tablet by mouth once a day  #100 x 3   Entered by:   Payton Spark CMA    Authorized by:   Corwin Levins MD   Signed by:   Payton Spark CMA on 01/29/2008   Method used:   Print then Give to Patient   RxID:   [id number] FUROSEMIDE 40 MG  TABS (FUROSEMIDE) Take 1 tablet by mouth once a day  #0 x 0   Entered by:   Payton Spark CMA   Authorized by:   Corwin Levins MD   Signed by:   Payton Spark CMA on 01/29/2008   Method used:   Print then Give to Patient   RxID:   [id number]   Appended Document: should she continue med Pt called stating that she left a msg last week about should she continue taking med. Requesting call back have'nt heard anything [phone]/lmb  Appended Document: should she continue med pt aware

## 2010-02-10 NOTE — Assessment & Plan Note (Signed)
Summary: Pulmonary/ ext acute ov   Primary Provider/Referring Provider:  Jonny Ruiz  CC:  Acute visit.  Pt c/o facial pressure and HA x 5 days.  She also c/o fever and nasal congestion.  She had prod cough with green sputum just since this am..  History of Present Illness: 40  yowf last smoked 2003 with   COPD with minimum asthmatic component with an FEV1 of 55%, a ratio of 53%, and a diffusing capacity 51% documented 07/25/06.  On 02 24 hours per day at 2lpm  6/09  weighed 271 and it was felt she was as debilitated as much by obesity as by airflow obstruction. baseline = when well = walk to mother in law's house but stops at least once because it's uphill and frequently requiring rescue rx.  March 01, 2008--post hospitalization follow up. Admitted 02/23/08 for post op respiratory difficulties. she underwent  left Wrist surgery, post op dyspnea, cough/wheezing. Admitted w/ aggressive resp toilet and IV steroids. Discharged. 02/27/08 improved.    May 01, 2008--Presents for follow up and med review and new  med calendar -  Since last visit doing much better.   June 20, 2008 ov post hosp for [facility] req d/c cardioversion but no change in rx,  walking 30 min daily on 02 c/o increaese cough day = night, dry.   April 18, 2009 cc Acute visit.  Pt c/o facial pressure and HA x 5 days.  She also c/o fever and nasal congestion.  She had prod cough with green sputum just since this am. going downhill steadily since xmas in terms of energy and not sleeping well. no increase need for xopenex or noct resp c/os  Pt denies any significant sore throat, dysphagia, itching, sneezing,  nasal congestion or excess secretions,  fever, chills, sweats, unintended wt loss, pleuritic or exertional cp, hempoptysis, change in activity tolerance  orthopnea pnd or leg swelling Pt also denies any obvious fluctuation in symptoms with weather or environmental change or other alleviating or aggravating factors.       Current Medications  (verified): 1)  Furosemide 40 Mg  Tabs (Furosemide) .... Take 1 Tablet By Mouth Once A Day and As Needed For Excessive Swelling 2)  Coumadin 7.5 Mg Tabs (Warfarin Sodium) .... Take Asd 1 Tablet By Mouth Once A Day 3)  Klor-Con 10 10 Meq Tbcr (Potassium Chloride) .... Take 2 Tabs Every Am and 1 Tab At Bedtime 4)  Fluoxetine Hcl 40 Mg Caps (Fluoxetine Hcl) .... Take 1 Tablet By Mouth Once A Day 5)  Omeprazole 20 Mg Cpdr (Omeprazole) .Marland Kitchen.. 1 By Mouth 30 Min Before 1st Meal 6)  Propafenone Hcl 325 Mg Xr12h-Cap (Propafenone Hcl) .Marland Kitchen.. 1 Tab Daily 7)  Nasonex 50 Mcg/act Susp (Mometasone Furoate) .... 2 Puffs Daily 8)  Symbicort 160-4.5 Mcg/act  Aero (Budesonide-Formoterol Fumarate) .... Two Puffs Twice Daily 9)  Exforge 10-320 Mg  Tabs (Amlodipine Besylate-Valsartan) .Marland Kitchen.. 1tab By Mouth Once Daily 10)  Spiriva Handihaler 18 Mcg  Caps (Tiotropium Bromide Monohydrate) .... Inhale Contents of 1 Capsule Once A Day 11)  Oxygen 2l Liquid .... Continuous 12)  Glucosamine-Chondroitin   Caps (Glucosamine-Chondroit-Vit C-Mn) .... Take 1 Capsule By Mouth Once A Day 13)  Calcium Carbonate-Vitamin D 600-400 Mg-Unit  Tabs (Calcium Carbonate-Vitamin D) .... Take 1 Tablet By Mouth Two Times A Day 14)  Vitamin D 1000 Unit Tabs (Cholecalciferol) .... Take 1 Tablet By Mouth Once A Day 15)  Xopenex 1.25 Mg/103ml  Nebu (Levalbuterol Hcl) .... One Every  4 Hours As Needed 16)  Xopenex Hfa 45 Mcg/act  Aero (Levalbuterol Tartrate) .... Inhale 2 Puffs Every 4 Hours As Needed 17)  Tylenol Arthritis Pain 650 Mg Cr-Tabs (Acetaminophen) .... Per Bottle 18)  Mucinex Dm 30-600 Mg Xr12h-Tab (Dextromethorphan-Guaifenesin) .Marland Kitchen.. 1 Every 12 Hours As Needed 19)  Tramadol Hcl 50 Mg  Tabs (Tramadol Hcl) .... Take 1-2 Tablet By Mouth Every 4 Hours As  Needed For Cough 20)  Fexofenadine Hcl 180 Mg Tabs (Fexofenadine Hcl) .... Take 1 Tablet By Mouth Once A Day 21)  Cvs Saline Nasal Spray 0.65 %  Soln (Saline) .... 2 Puffs Every 4 Hours As  Needed 22)  Afrin Nasal Spray 0.05 % Soln (Oxymetazoline Hcl) .Marland Kitchen.. 1 Puff Two Times A Day X5days 23)  Hydrocodone-Homatropine 5-1.5 Mg/6ml Syrp (Hydrocodone-Homatropine) .Marland Kitchen.. 1 Tsp By Mouth Q 6 Hrs As Needed Cough  Allergies (verified): 1)  ! Pcn  Past History:  Past Medical History: CHRONIC RHINITIS (ICD-472.0) EDEMA (ICD-782.3) ACUTE AND CHRONIC RESPIRATORY FAILURE (ICD-518.84) MORBID OBESITY (ICD-278.01)     - Target wt  =   179  for BMI < 30 peak wt 282     - Referred back to nutrition December 18, 2007  DEEP VENOUS THROMBOPHLEBITIS, HX OF (ICD-V12.52) COPD (ICD-496)........................................................Marland KitchenWert     - Pft's 07/25/06 FEV1 55% ratio 53, DLC0 51% DEPRESSION (ICD-311) OSTEOPOROSIS (ICD-733.00) last BMD 4/08 -2.4, intolerant of bisphosphonates  HYPERTENSION (ICD-401.9) Atrial fibrillation - paroxysmal Anticoagulation therapy Pulmonary embolism, hx of neg stress myoview 10/07 Anxiety glucose intolerance - on steroids Hyperlipidemia Complex medical regimen    - Med calendar done 05/01/2008  Vital Signs:  Patient profile:   60 year old female Weight:      267 pounds O2 Sat:      97 % on 2 L/min Temp:     98.3 degrees F oral Pulse rate:   81 / minute BP sitting:   160 / 90  (left arm) Cuff size:   large  Vitals Entered By: Vernie Murders (April 18, 2009 10:26 AM)  O2 Flow:  2 L/min  Physical Exam  Additional Exam:  Ambulatory healthy appearing in no acute distress.   280 February 07, 2008 > 284 March 20, 2008 >275 April 09, 2008 >>259 May 01, 2008>   267 April 18, 2009  HEENT: nl dentition, turbinates, and orophanx. Nl external ear canals without cough reflex Neck without JVD/Nodes/TM Lungs coarse BS w/ few rhonchi RRR no m/r/g no edema Abd soft and benign with limited excursion in the supine position.  Ext warm without calf tenderness, cyanosis clubbing or edema Skin warm and dry without lesions     Impression &  Recommendations:  Problem # 1:  COPD (ICD-496) GOLD II complicated by obesity and chronic 02 dep resp failure and hypercarbic resp failure, relatively well compensated on 02   DDX of  difficult airways managment all start with A and  include Adherence, Ace Inhibitors, Acid Reflux, Active Sinus Disease, Alpha 1 Antitripsin deficiency, Anxiety masquerading as Airways dz,  ABPA,  allergy(esp in young), Aspiration (esp in elderly), Adverse effects of DPI,  Active smokers, plus one B  = Beta blocker use..    Adherence seems better with use of calendar with mild flare at present ? Active sinusitis ?  try doxy first   I had an extended discussion with the patient today lasting 15 to 20 minutes of a 25 minute visit on the following issues:   Each maintenance medication was reviewed  in detail including most importantly the difference between maintenance and as needed and under what circumstances the prns are to be used. This was done in the context of a medication calendar review which provided the patient with a user-friendly unambiguous mechanism for medication administration and reconciliation and provides an action plan for all active problems. It is critical that this be shown to every doctor  for modification during the office visit if necessary so the patient can use it as a working document. I emphasized to the patient that just like Dorothy in [employer], she is already wearing "ruby slippers" she can click anytime she wants:  the answer to all her recurrent symptoms  is already  literally at her fingertips:   all she has to do is refer to the medicine calendar we provided her  and her problems  are each addressed in a user-friendly format, reading from left to right for each symptoms she's likely to develop between office visits.    Consider Rehab referral next step  Medications Added to Medication List This Visit: 1)  Doxycycline Hyclate 100 Mg Caps (Doxycycline hyclate) .... One twice daily  before meals 2)  Prednisone 10 Mg Tabs (Prednisone) .... 4 each am x 2days, 2x2days, 1x2days and stop  Patient Instructions: 1)  GERD (REFLUX)  is a common cause of respiratory symptoms. It commonly presents without heartburn and can be treated with medication, but also with lifestyle changes including avoidance of late meals, excessive alcohol, smoking cessation, and avoid fatty foods, chocolate, peppermint, colas, red wine, and acidic juices such as orange juice. NO MINT OR MENTHOL PRODUCTS SO NO COUGH DROPS  2)  USE SUGARLESS CANDY INSTEAD (jolley ranchers)  3)  NO OIL BASED VITAMINS  4)  Prednisone x 6 days 5)  Doxycylcine x 10 days should turn the mucus white 6)  See calendar for specific medication instructions and bring it back for each and every office visit for every healthcare provider you see.  Without it,  you may not receive the best quality medical care that we feel you deserve. if the as neededs are working to your satisfaction see me or [name] (remember Dorothy) 7)  ADD Consider Rehab referral next step Prescriptions: PREDNISONE 10 MG  TABS (PREDNISONE) 4 each am x 2days, 2x2days, 1x2days and stop  #14 x 0   Entered and Authorized by:   Nyoka Cowden MD   Signed by:   Nyoka Cowden MD on 04/18/2009   Method used:   [facility] to        [facility]  [address] [phone]* (retail)       [address]       [place], [place]  [address]       Ph: [phone] or [phone]       Fax: [phone]   RxID:   [id number] DOXYCYCLINE HYCLATE 100 MG CAPS (DOXYCYCLINE HYCLATE) one twice daily before meals  #20 x 0   Entered and Authorized by:   Nyoka Cowden MD   Signed by:   Nyoka Cowden MD on 04/18/2009   Method used:   [facility]  [address] [phone][facility] (retail)       [address]       [place], [place]  [address]       Ph: [phone] or  [phone]       Fax: [phone]   RxID:    [id number]   Appended Document: Orders Update    Clinical Lists Changes  Orders: Added new Service order of Est. Patient Level IV (62952) - Signed

## 2010-02-10 NOTE — Progress Notes (Signed)
Summary: changed meds- req nurse call  Phone Note Call from Patient   Caller: Patient Call For: wert Summary of Call: re: b / p . pt needs to discuss her meds that are making her cough. says she is suppose to speak to tp when meds have been changed.  pt has COPD.   Follow-up for Phone Call        Called spoke with pt.  Saw Dr Jonny Ruiz 08/29/907 he rx Hydralizine 10mg  take 1 every 6 hrs for SBP-160. Pt states she had incr coughing after taking.  Coughed x 2 days straight.  Pt wants to know if this could cause cough.  Notified Dr Jonny Ruiz and he advised this med was not causing cough. Follow-up by: Cloyde Reams RN,  September 07, 2007 10:16 AM  Additional Follow-up for Phone Call Additional follow up Details #1::        No this med does not cause a cough rec stop fish oil use mucinex dm and tramadol on med list to help with cough, if not better, ov Additional Follow-up by: Chalonda Schlatter NP,  September 07, 2007 10:39 AM    Additional Follow-up for Phone Call Additional follow up Details #2::    Called spoke with pt advised per TP Hydralizine not causing cough, so continue to take as instructed by Dr Jonny Ruiz.  Pt feels BP is not being treated adequately.  Advised pt she needs to contact Dr Jonny Ruiz and address those issues because he is the MD treating her BP.  As for her cough advised to d/c fish oil and take Tramadol and Mucinex DM as needed.  OV if cough persists. Follow-up by: Cloyde Reams RN,  September 07, 2007 10:56 AM

## 2010-02-10 NOTE — Medication Information (Signed)
Summary: CCR/AMD  Anticoagulant Therapy  Managed by: Charlena Cross, RN, BSN Referring MD: Valera Castle MD PCP: Gwen Pounds MD: Mariah Milling Indication 1: Pulmonary embolism (ICD-415.19) Lab Used: [facility] Site:  INR POC 2.5 INR RANGE 2 - 3  Dietary changes: no    Health status changes: no    Bleeding/hemorrhagic complications: no    Recent/future hospitalizations: no    Any changes in medication regimen? no    Recent/future dental: no  Any missed doses?: no       Is patient compliant with meds? yes       Allergies: 1)  ! Pcn  Anticoagulation Management History:      The patient is taking warfarin and comes in today for a routine follow up visit.  Negative risk factors for bleeding include an age less than 30 years old.  The bleeding index is 'low risk'.  Positive CHADS2 values include History of HTN.  Negative CHADS2 values include Age > 27 years old.  The start date was 06/13/2008.  Her last INR was 5.6.  Anticoagulation responsible provider: Yenesis Even.  INR POC: 2.5.    Anticoagulation Management Assessment/Plan:      The patient's current anticoagulation dose is Coumadin 7.5 mg tabs: Take asd 1 tablet by mouth once a day.  The target INR is 2 - 3.  The next INR is due 03/31/2009.  Anticoagulation instructions were given to patient.  Results were reviewed/authorized by Charlena Cross, RN, BSN.  She was notified by Charlena Cross, RN, BSN.         Prior Anticoagulation Instructions: coumadin 12.5 on Wed.  then resume coumadin 11.25 mg daily with 7.5 on Sun Wed Fri  Current Anticoagulation Instructions: The patient is to continue with the same dose of coumadin.  This dosage includes: coumadin 11.25 mg daily with 7.5 mg on Sun Wed Fri

## 2010-02-10 NOTE — Medication Information (Signed)
Summary: Clarification for [facility]  Clarification for [facility]   Imported By: Sherian Rein 09/24/2008 15:14:15  _____________________________________________________________________  External Attachment:    Type:   Image     Comment:   External Document

## 2010-02-10 NOTE — Assessment & Plan Note (Signed)
Summary: sore throat/cold/john/cd   Vital Signs:  Patient profile:   60 year old female Weight:      256 pounds Temp:     98.6 degrees F oral BP sitting:   124 / 88  (left arm) Cuff size:   large  Vitals Entered By: Alfred Levins, CMA (October 05, 2008 9:56 AM) CC: sinus h/a, rt earache   History of Present Illness: Here for continued symptoms consistent with a sinus infection. For 3 weeks she has had HA, sinus pressure, ST, and a dry cough. Using Mucinex. Finshed a course of Septra with no improvement.   Allergies: 1)  ! Pcn  Past History:  Past Medical History: Reviewed history from 06/20/2008 and no changes required. CHRONIC RHINITIS (ICD-472.0) EDEMA (ICD-782.3) ACUTE AND CHRONIC RESPIRATORY FAILURE (ICD-518.84) MORBID OBESITY (ICD-278.01)     - Target wt  =   179  for BMI < 30 peak wt 282     - Referred back to nutrition December 18, 2007  DEEP VENOUS THROMBOPHLEBITIS, HX OF (ICD-V12.52) COPD (ICD-496)     - Pft's 07/25/06 FEV1 55% ratio 53, DLC0 51% DEPRESSION (ICD-311) OSTEOPOROSIS (ICD-733.00) last BMD 4/08 -2.4, intolerant of bisphosphonates  HYPERTENSION (ICD-401.9) Atrial fibrillation - paroxysmal Anticoagulation therapy Pulmonary embolism, hx of neg stress myoview 10/07 Anxiety glucose intolerance - on steroids Hyperlipidemia Complex medical regimen    - Med calendar done 05/01/2008  Review of Systems  The patient denies anorexia, fever, weight loss, weight gain, vision loss, decreased hearing, hoarseness, chest pain, syncope, dyspnea on exertion, peripheral edema, hemoptysis, abdominal pain, melena, hematochezia, severe indigestion/heartburn, hematuria, incontinence, genital sores, muscle weakness, suspicious skin lesions, transient blindness, difficulty walking, depression, unusual weight change, abnormal bleeding, enlarged lymph nodes, angioedema, breast masses, and testicular masses.    Physical Exam   General:  Well-developed,well-nourished,in no acute distress; alert,appropriate and cooperative throughout examination. Wearing [place] oxygen Head:  Normocephalic and atraumatic without obvious abnormalities. No apparent alopecia or balding. Eyes:  No corneal or conjunctival inflammation noted. EOMI. Perrla. Funduscopic exam benign, without hemorrhages, exudates or papilledema. Vision grossly normal. Ears:  External ear exam shows no significant lesions or deformities.  Otoscopic examination reveals clear canals, tympanic membranes are intact bilaterally without bulging, retraction, inflammation or discharge. Hearing is grossly normal bilaterally. Nose:  External nasal examination shows no deformity or inflammation. Nasal mucosa are pink and moist without lesions or exudates. Mouth:  Oral mucosa and oropharynx without lesions or exudates.  Teeth in good repair. Neck:  No deformities, masses, or tenderness noted. Lungs:  Normal respiratory effort, chest expands symmetrically. Lungs are clear to auscultation, no crackles or wheezes. Heart:  Normal rate and regular rhythm. S1 and S2 normal without gallop, murmur, click, rub or other extra sounds.   Impression & Recommendations:  Problem # 1:  SINUSITIS- ACUTE-NOS (ICD-461.9)  The following medications were removed from the medication list:    Septra Ds 800-160 Mg Tabs (Sulfamethoxazole-trimethoprim) .Marland Kitchen... 1po two times a day Her updated medication list for this problem includes:    Veramyst 27.5 Mcg/spray Susp (Fluticasone furoate) ..... One spray two times a day    Mucinex Dm 30-600 Mg Xr12h-tab (Dextromethorphan-guaifenesin) .Marland Kitchen... 1 every 12 hours as needed    Cvs Saline Nasal Spray 0.65 % Soln (Saline) .Marland Kitchen... 2 puffs every 4 hours as needed    Afrin Nasal Spray 0.05 % Soln (Oxymetazoline hcl) .Marland Kitchen... 1 puff two times a day x5days    Tessalon Perles 100 Mg Caps (Benzonatate) .Marland Kitchen... 1 - 2  by mouth three times a day as needed     Levaquin 500 Mg Tabs (Levofloxacin) ..... Once daily  Complete Medication List: 1)  Furosemide 40 Mg Tabs (Furosemide) .... Take 1 tablet by mouth once a day and as needed for excessive swelling 2)  Coumadin 7.5 Mg Tabs (Warfarin sodium) .... Take asd 1 tablet by mouth once a day 3)  Klor-con 10 10 Meq Tbcr (Potassium chloride) .... Take 2 tabs every am and 1 tab at bedtime 4)  Fluoxetine Hcl 40 Mg Caps (Fluoxetine hcl) .... Take 1 tablet by mouth once a day 5)  Omeprazole 20 Mg Cpdr (Omeprazole) .Marland Kitchen.. 1 by mouth 30 min before 1st meal 6)  Rythmol Sr 325 Mg Cp12 (Propafenone hcl) .... Two times a day 7)  Veramyst 27.5 Mcg/spray Susp (Fluticasone furoate) .... One spray two times a day 8)  Symbicort 160-4.5 Mcg/act Aero (Budesonide-formoterol fumarate) .... Two puffs twice daily 9)  Exforge 10-320 Mg Tabs (Amlodipine besylate-valsartan) .Marland Kitchen.. 1tab by mouth once daily 10)  Spiriva Handihaler 18 Mcg Caps (Tiotropium bromide monohydrate) .... Inhale contents of 1 capsule once a day 11)  Oxygen 2l Liquid  .... Continuous 12)  Glucosamine-chondroitin Caps (Glucosamine-chondroit-vit c-mn) .... Take 1 capsule by mouth once a day 13)  Calcium Carbonate-vitamin D 600-400 Mg-unit Tabs (Calcium carbonate-vitamin d) .... Take 1 tablet by mouth two times a day 14)  Vitamin D 1000 Unit Tabs (Cholecalciferol) .... Take 1 tablet by mouth once a day 15)  Forteo 600 Mcg/2.78ml Soln (Teriparatide (recombinant)) .... 20 micrograms subcutaneously once daily 16)  Xopenex 1.25 Mg/14ml Nebu (Levalbuterol hcl) .... One every 4 hours as needed 17)  Xopenex Hfa 45 Mcg/act Aero (Levalbuterol tartrate) .... Inhale 2 puffs every 4 hours as needed 18)  Tylenol Arthritis Pain 650 Mg Cr-tabs (Acetaminophen) .... Per bottle 19)  Mucinex Dm 30-600 Mg Xr12h-tab (Dextromethorphan-guaifenesin) .Marland Kitchen.. 1 every 12 hours as needed 20)  Tramadol Hcl 50 Mg Tabs (Tramadol hcl) .... Take 1-2 tablet by mouth every 4 hours as  needed for cough  21)  Fexofenadine Hcl 180 Mg Tabs (Fexofenadine hcl) .... Take 1 tablet by mouth once a day 22)  Cvs Saline Nasal Spray 0.65 % Soln (Saline) .... 2 puffs every 4 hours as needed 23)  Afrin Nasal Spray 0.05 % Soln (Oxymetazoline hcl) .Marland Kitchen.. 1 puff two times a day x5days 24)  Tessalon Perles 100 Mg Caps (Benzonatate) .Marland Kitchen.. 1 - 2 by mouth three times a day as needed 25)  Levaquin 500 Mg Tabs (Levofloxacin) .... Once daily  Patient Instructions: 1)  Please schedule a follow-up appointment as needed .  Prescriptions: LEVAQUIN 500 MG TABS (LEVOFLOXACIN) once daily  #10 x 0   Entered and Authorized by:   Nelwyn Salisbury MD   Signed by:   Nelwyn Salisbury MD on 10/05/2008   Method used:   [facility]        [facility] [address] [phone]* (retail)       [address]       [place], [place]  [address]       Ph: [phone] or [phone]       Fax: [phone]   RxID:   [id number]

## 2010-02-10 NOTE — Assessment & Plan Note (Signed)
Summary: [name] [name]  ST[facility]   Vital Signs:  Patient profile:   60 year old female Height:      66 inches Weight:      257 pounds BMI:     41.63 O2 Sat:      95 % Temp:     97.5 degrees F oral Pulse rate:   84 / minute BP sitting:   142 / 80  (left arm) Cuff size:   large  Vitals Entered By: Windell Norfolk (July 22, 2008 2:12 PM) CC: dysuria x 2 1/2 days   Primary Care Provider:  Jonny Ruiz  CC:  dysuria x 2 1/2 days.  History of Present Illness: here with the above, as well as freq, urgency and mild incontinence acutely assoc with the GU symptoms in combination with a persistent cough chronic non prod; no fever, chills, n/v, but has some left flank pain; no hematuria or hx of stones  Problems Prior to Update: 1)  Dysuria  (ICD-788.1) 2)  Atrial Flutter  (ICD-427.32) 3)  Cough  (ICD-786.2) 4)  Ankle Pain  (ICD-719.47) 5)  Dependent Edema, Right Leg  (ICD-782.3) 6)  Pneumonia Organism Nos  (ICD-486) 7)  Preventive Health Care  (ICD-V70.0) 8)  Conjunctivitis  (ICD-372.30) 9)  Vitamin D Deficiency  (ICD-268.9) 10)  Preventive Health Care  (ICD-V70.0) 11)  Hyperlipidemia  (ICD-272.4) 12)  Glucose Intolerance  (ICD-271.3) 13)  Anxiety  (ICD-300.00) 14)  Pulmonary Embolism, Hx of  (ICD-V12.51) 15)  Anticoagulation Therapy  (ICD-V58.61) 16)  Atrial Fibrillation  (ICD-427.31) 17)  Chronic Rhinitis  (ICD-472.0) 18)  Edema  (ICD-782.3) 19)  Obstructive Chronic Bronchitis With Exacerbation  (ICD-491.21) 20)  Acute and Chronic Respiratory Failure  (ICD-518.84) 21)  Morbid Obesity  (ICD-278.01) 22)  Deep Venous Thrombophlebitis, Hx of  (ICD-V12.52) 23)  COPD  (ICD-496) 24)  Depression  (ICD-311) 25)  Osteoporosis  (ICD-733.00) 26)  Hypertension  (ICD-401.9)  Medications Prior to Update: 1)  Furosemide 40 Mg  Tabs (Furosemide) .... Take 1 Tablet By Mouth Once A Day 2)  Coumadin 7.5 Mg Tabs (Warfarin Sodium) .... Take Asd 1 Tablet By Mouth Once A Day  3)  Klor-Con 10 10 Meq Tbcr (Potassium Chloride) .... Take 2 Tabs Every Am and 1 Tab At Bedtime 4)  Fluoxetine Hcl 40 Mg Caps (Fluoxetine Hcl) .... Take 1 Tablet By Mouth Once A Day 5)  Omeprazole 20 Mg Cpdr (Omeprazole) .Marland Kitchen.. 1 By Mouth 30 Min Before 1st Meal 6)  Rythmol Sr 325 Mg Cp12 (Propafenone Hcl) .... Two Times A Day 7)  Veramyst 27.5 Mcg/spray  Susp (Fluticasone Furoate) .... One Spray Two Times A Day 8)  Symbicort 160-4.5 Mcg/act  Aero (Budesonide-Formoterol Fumarate) .... Two Puffs Twice Daily 9)  Exforge 10-320 Mg  Tabs (Amlodipine Besylate-Valsartan) .Marland Kitchen.. 1tab By Mouth Once Daily 10)  Spiriva Handihaler 18 Mcg  Caps (Tiotropium Bromide Monohydrate) .... Inhale Contents of 1 Capsule Once A Day 11)  Oxygen 2l Liquid .... Continuous 12)  Glucosamine-Chondroitin   Caps (Glucosamine-Chondroit-Vit C-Mn) .... Take 1 Capsule By Mouth Once A Day 13)  Calcium Carbonate-Vitamin D 600-400 Mg-Unit  Tabs (Calcium Carbonate-Vitamin D) .... Take 1 Tablet By Mouth Two Times A Day 14)  Vitamin D 1000 Unit Tabs (Cholecalciferol) .... Take 1 Tablet By Mouth Once A Day 15)  Forteo 600 Mcg/2.66ml Soln (Teriparatide (Recombinant)) .... 20 Micrograms Subcutaneously Once Daily 16)  Xopenex 1.25 Mg/62ml  Nebu (Levalbuterol Hcl) .... One Every 4 Hours As Needed 17)  Xopenex Hfa 45  Mcg/act  Aero (Levalbuterol Tartrate) .... Inhale 2 Puffs Every 4 Hours As Needed 18)  Tylenol Arthritis Pain 650 Mg Cr-Tabs (Acetaminophen) .... Per Bottle 19)  Mucinex Dm 30-600 Mg Xr12h-Tab (Dextromethorphan-Guaifenesin) .Marland Kitchen.. 1 Every 12 Hours As Needed 20)  Tramadol Hcl 50 Mg  Tabs (Tramadol Hcl) .... Take 1-2 Tablet By Mouth Every 4 Hours As  Needed For Cough 21)  Fexofenadine Hcl 180 Mg Tabs (Fexofenadine Hcl) .... Take 1 Tablet By Mouth Once A Day 22)  Cvs Saline Nasal Spray 0.65 %  Soln (Saline) .... 2 Puffs Every 4 Hours As Needed 23)  Afrin Nasal Spray 0.05 % Soln (Oxymetazoline Hcl) .Marland Kitchen.. 1 Puff Two Times A Day X5days  24)  Furosemide 40 Mg Tabs (Furosemide) .Marland Kitchen.. 1 Extra Daily As Needed  Current Medications (verified): 1)  Furosemide 40 Mg  Tabs (Furosemide) .... Take 1 Tablet By Mouth Once A Day 2)  Coumadin 7.5 Mg Tabs (Warfarin Sodium) .... Take Asd 1 Tablet By Mouth Once A Day 3)  Klor-Con 10 10 Meq Tbcr (Potassium Chloride) .... Take 2 Tabs Every Am and 1 Tab At Bedtime 4)  Fluoxetine Hcl 40 Mg Caps (Fluoxetine Hcl) .... Take 1 Tablet By Mouth Once A Day 5)  Omeprazole 20 Mg Cpdr (Omeprazole) .Marland Kitchen.. 1 By Mouth 30 Min Before 1st Meal 6)  Rythmol Sr 325 Mg Cp12 (Propafenone Hcl) .... Two Times A Day 7)  Veramyst 27.5 Mcg/spray  Susp (Fluticasone Furoate) .... One Spray Two Times A Day 8)  Symbicort 160-4.5 Mcg/act  Aero (Budesonide-Formoterol Fumarate) .... Two Puffs Twice Daily 9)  Exforge 10-320 Mg  Tabs (Amlodipine Besylate-Valsartan) .Marland Kitchen.. 1tab By Mouth Once Daily 10)  Spiriva Handihaler 18 Mcg  Caps (Tiotropium Bromide Monohydrate) .... Inhale Contents of 1 Capsule Once A Day 11)  Oxygen 2l Liquid .... Continuous 12)  Glucosamine-Chondroitin   Caps (Glucosamine-Chondroit-Vit C-Mn) .... Take 1 Capsule By Mouth Once A Day 13)  Calcium Carbonate-Vitamin D 600-400 Mg-Unit  Tabs (Calcium Carbonate-Vitamin D) .... Take 1 Tablet By Mouth Two Times A Day 14)  Vitamin D 1000 Unit Tabs (Cholecalciferol) .... Take 1 Tablet By Mouth Once A Day 15)  Forteo 600 Mcg/2.63ml Soln (Teriparatide (Recombinant)) .... 20 Micrograms Subcutaneously Once Daily 16)  Xopenex 1.25 Mg/76ml  Nebu (Levalbuterol Hcl) .... One Every 4 Hours As Needed 17)  Xopenex Hfa 45 Mcg/act  Aero (Levalbuterol Tartrate) .... Inhale 2 Puffs Every 4 Hours As Needed 18)  Tylenol Arthritis Pain 650 Mg Cr-Tabs (Acetaminophen) .... Per Bottle 19)  Mucinex Dm 30-600 Mg Xr12h-Tab (Dextromethorphan-Guaifenesin) .Marland Kitchen.. 1 Every 12 Hours As Needed 20)  Tramadol Hcl 50 Mg  Tabs (Tramadol Hcl) .... Take 1-2 Tablet By Mouth Every 4 Hours As  Needed For Cough  21)  Fexofenadine Hcl 180 Mg Tabs (Fexofenadine Hcl) .... Take 1 Tablet By Mouth Once A Day 22)  Cvs Saline Nasal Spray 0.65 %  Soln (Saline) .... 2 Puffs Every 4 Hours As Needed 23)  Afrin Nasal Spray 0.05 % Soln (Oxymetazoline Hcl) .Marland Kitchen.. 1 Puff Two Times A Day X5days 24)  Furosemide 40 Mg Tabs (Furosemide) .Marland Kitchen.. 1 Extra Daily As Needed 25)  Ciprofloxacin Hcl 500 Mg Tabs (Ciprofloxacin Hcl) .Marland Kitchen.. 1 By Mouth Two Times A Day 26)  Tessalon Perles 100 Mg Caps (Benzonatate) .Marland Kitchen.. 1 - 2 By Mouth Three Times A Day As Needed  Allergies (verified): 1)  ! Pcn  Past History:  Past Medical History: Last updated: 06/20/2008 CHRONIC RHINITIS (ICD-472.0) EDEMA (ICD-782.3) ACUTE AND CHRONIC  RESPIRATORY FAILURE (ICD-518.84) MORBID OBESITY (ICD-278.01)     - Target wt  =   179  for BMI < 30 peak wt 282     - Referred back to nutrition December 18, 2007  DEEP VENOUS THROMBOPHLEBITIS, HX OF (ICD-V12.52) COPD (ICD-496)     - Pft's 07/25/06 FEV1 55% ratio 53, DLC0 51% DEPRESSION (ICD-311) OSTEOPOROSIS (ICD-733.00) last BMD 4/08 -2.4, intolerant of bisphosphonates  HYPERTENSION (ICD-401.9) Atrial fibrillation - paroxysmal Anticoagulation therapy Pulmonary embolism, hx of neg stress myoview 10/07 Anxiety glucose intolerance - on steroids Hyperlipidemia Complex medical regimen    - Med calendar done 05/01/2008  Past Surgical History: Last updated: 04/15/2008 Hysterectomy Tonsillectomy Skin Graft to midde R finger-1975 s/p left arm fracture with fall off chair  Social History: Last updated: 06/19/2007 Patient states former smoker quit smoking in 2003 Married 3 children Alcohol use-no 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  , on home o2 Head:  normocephalic and atraumatic.    Eyes:  vision grossly intact, pupils equal, and pupils round.   Ears:  R ear normal and L ear normal.   Nose:  no external deformity and no nasal discharge.   Mouth:  no gingival abnormalities and pharynx pink and moist.   Neck:  supple and no masses.   Lungs:  R decreased breath sounds and L decreased breath sounds.   Heart:  normal rate and regular rhythm.   Abdomen:  soft, non-tender, and normal bowel sounds.  except for mild mid suprapubic tenderness, no guarding or rebound Msk:  mild left flank tender Extremities:  no edema, no ulcers    Impression & Recommendations:  Problem # 1:  DYSURIA (ICD-788.1)  Orders: T-Culture, Urine (04540-98119) TLB-Udip w/ Micro (81001-URINE)  Her updated medication list for this problem includes:    Ciprofloxacin Hcl 500 Mg Tabs (Ciprofloxacin hcl) .Marland Kitchen... 1 by mouth two times a day prob uti/? early pyelonephritis  - check urine studies, treat as above, f/u any worsening signs or symptoms   Problem # 2:  HYPERTENSION (ICD-401.9)  Her updated medication list for this problem includes:    Furosemide 40 Mg Tabs (Furosemide) .Marland Kitchen... Take 1 tablet by mouth once a day    Exforge 10-320 Mg Tabs (Amlodipine besylate-valsartan) .Marland Kitchen... 1tab by mouth once daily    Furosemide 40 Mg Tabs (Furosemide) .Marland Kitchen... 1 extra daily as needed  BP today: 142/80 Prior BP: 144/75 (07/01/2008)  Labs Reviewed: K+: 3.7 (03/28/2008) Creat: : 0.8 (03/28/2008)   Chol: 178 (12/05/2007)   HDL: 39.8 (12/05/2007)   LDL: 120 (12/05/2007)   TG: 90 (12/05/2007) mild elev likely situational - ok to follow  Problem # 3:  COUGH (ICD-786.2) pulm status seems stable but with cough - ok for tessalon as needed for now as this exacerbates #1  Complete Medication List: 1)  Furosemide 40 Mg Tabs (Furosemide) .... Take 1 tablet by mouth once a day 2)  Coumadin 7.5 Mg Tabs (Warfarin sodium) .... Take asd 1 tablet by mouth once a day  3)  Klor-con 10 10 Meq Tbcr (Potassium chloride) .... Take 2 tabs every am and 1 tab at bedtime 4)  Fluoxetine Hcl 40 Mg Caps (Fluoxetine hcl) .... Take 1 tablet by mouth once a day 5)  Omeprazole 20 Mg Cpdr (Omeprazole) .Marland Kitchen.. 1 by mouth 30 min before 1st  meal 6)  Rythmol Sr 325 Mg Cp12 (Propafenone hcl) .... Two times a day 7)  Veramyst 27.5 Mcg/spray Susp (Fluticasone furoate) .... One spray two times a day 8)  Symbicort 160-4.5 Mcg/act Aero (Budesonide-formoterol fumarate) .... Two puffs twice daily 9)  Exforge 10-320 Mg Tabs (Amlodipine besylate-valsartan) .Marland Kitchen.. 1tab by mouth once daily 10)  Spiriva Handihaler 18 Mcg Caps (Tiotropium bromide monohydrate) .... Inhale contents of 1 capsule once a day 11)  Oxygen 2l Liquid  .... Continuous 12)  Glucosamine-chondroitin Caps (Glucosamine-chondroit-vit c-mn) .... Take 1 capsule by mouth once a day 13)  Calcium Carbonate-vitamin D 600-400 Mg-unit Tabs (Calcium carbonate-vitamin d) .... Take 1 tablet by mouth two times a day 14)  Vitamin D 1000 Unit Tabs (Cholecalciferol) .... Take 1 tablet by mouth once a day 15)  Forteo 600 Mcg/2.78ml Soln (Teriparatide (recombinant)) .... 20 micrograms subcutaneously once daily 16)  Xopenex 1.25 Mg/40ml Nebu (Levalbuterol hcl) .... One every 4 hours as needed 17)  Xopenex Hfa 45 Mcg/act Aero (Levalbuterol tartrate) .... Inhale 2 puffs every 4 hours as needed 18)  Tylenol Arthritis Pain 650 Mg Cr-tabs (Acetaminophen) .... Per bottle 19)  Mucinex Dm 30-600 Mg Xr12h-tab (Dextromethorphan-guaifenesin) .Marland Kitchen.. 1 every 12 hours as needed 20)  Tramadol Hcl 50 Mg Tabs (Tramadol hcl) .... Take 1-2 tablet by mouth every 4 hours as  needed for cough 21)  Fexofenadine Hcl 180 Mg Tabs (Fexofenadine hcl) .... Take 1 tablet by mouth once a day 22)  Cvs Saline Nasal Spray 0.65 % Soln (Saline) .... 2 puffs every 4 hours as needed 23)  Afrin Nasal Spray 0.05 % Soln (Oxymetazoline hcl) .Marland Kitchen.. 1 puff two times a day x5days  24)  Furosemide 40 Mg Tabs (Furosemide) .Marland Kitchen.. 1 extra daily as needed 25)  Ciprofloxacin Hcl 500 Mg Tabs (Ciprofloxacin hcl) .Marland Kitchen.. 1 by mouth two times a day 26)  Tessalon Perles 100 Mg Caps (Benzonatate) .Marland Kitchen.. 1 - 2 by mouth three times a day as needed  Patient Instructions: 1)  Please go to the Lab in the basement for your urine test today 2)  Please take all new medications as prescribed 3)  Continue all medications that you may have been taking previously  4)  Please schedule a follow-up appointment in 3 months. Prescriptions: TESSALON PERLES 100 MG CAPS (BENZONATATE) 1 - 2 by mouth three times a day as needed  #60 x 1   Entered and Authorized by:   Corwin Levins MD   Signed by:   Corwin Levins MD on 07/22/2008   Method used:   Print then Give to Patient   RxID:   [id number] CIPROFLOXACIN HCL 500 MG TABS (CIPROFLOXACIN HCL) 1 by mouth two times a day  #20 x 0   Entered and Authorized by:   Corwin Levins MD   Signed by:   Corwin Levins MD on 07/22/2008   Method used:   Print then Give to Patient   RxID:   [id number]

## 2010-02-10 NOTE — Progress Notes (Signed)
  Phone Note Refill Request  on March 31, 2009 3:28 PM  Refills Requested: Medication #1:  FLUOXETINE HCL 40 MG CAPS Take 1 tablet by mouth once a day   Dosage confirmed as above?Dosage Confirmed   Notes: [facility] Initial call taken by: Scharlene Gloss,  March 31, 2009 3:28 PM    Prescriptions: FLUOXETINE HCL 40 MG CAPS (FLUOXETINE HCL) Take 1 tablet by mouth once a day  #90 x 3   Entered by:   Scharlene Gloss   Authorized by:   Corwin Levins MD   Signed by:   Scharlene Gloss on 03/31/2009   Method used:   Faxed to ...       [facility]* (mail-order)             ,          Ph: [phone]       Fax: [phone]   RxID:   [id number]

## 2010-02-10 NOTE — Assessment & Plan Note (Signed)
Summary: Acute NP office visit - bronchitis   Primary Provider/Referring Provider:  Corwin Levins MD  CC:  prod cough with green mucus, wheezing, increased SOB, body aches, HA, sore throat, and chills/sweats x4days.  History of Present Illness: 60  yowf last smoked 2003 with   COPD with minimum asthmatic component with an FEV1 of 55%, a ratio of 53%, and a diffusing capacity 51% documented 07/25/06.  On 02 24 hours per day at 2lpm  6/09  weighed 271 and it was felt she was as debilitated as much by obesity as by airflow obstruction. baseline = when well = walk to mother in law's house but stops at least once because it's uphill and frequently requiring rescue rx.  March 01, 2008--post hospitalization follow up. Admitted 02/23/08 for post op respiratory difficulties. she underwent  left Wrist surgery, post op dyspnea, cough/wheezing. Admitted w/ aggressive resp toilet and IV steroids. Discharged. 02/27/08 improved.    May 01, 2008--Presents for follow up and med review and new  med calendar -  Since last visit doing much better.   June 20, 2008 ov post hosp for [facility] req d/c cardioversion but no change in rx,  walking 30 min daily on 02 c/o increaese cough day = night, dry.   April 18, 2009 cc Acute visit.  Pt c/o facial pressure and HA x 5 days.  She also c/o fever and nasal congestion.  She had prod cough with green sputum just since this am. going downhill steadily since xmas in terms of energy and not sleeping well. no increase need for xopenex or noct resp c/os     July 17, 2009--Presents for an acute office visit. Complains of prod cough with green mucus, wheezing, increased SOB, body aches, HA, sore throat, chills/sweats x4days. OTC not helping. Denies chest pain, , orthopnea, hemoptysis, fever, n/v/d, edema, headache.   Medications Prior to Update: 1)  Furosemide 40 Mg  Tabs (Furosemide) .... Take 1 Tablet By Mouth Once A Day and As Needed For Excessive Swelling 2)  Coumadin 7.5 Mg  Tabs (Warfarin Sodium) .... Take Asd 1 Tablet By Mouth Once A Day 3)  Klor-Con 10 10 Meq Tbcr (Potassium Chloride) .... Take 2 Tabs Every Am and 1 Tab At Bedtime 4)  Omeprazole 20 Mg Cpdr (Omeprazole) .Marland Kitchen.. 1 By Mouth 30 Min Before 1st Meal 5)  Propafenone Hcl 325 Mg Xr12h-Cap (Propafenone Hcl) .Marland Kitchen.. 1 Tab Daily 6)  Nasonex 50 Mcg/act Susp (Mometasone Furoate) .... 2 Puffs Daily 7)  Symbicort 160-4.5 Mcg/act  Aero (Budesonide-Formoterol Fumarate) .... Two Puffs Twice Daily 8)  Exforge 10-320 Mg  Tabs (Amlodipine Besylate-Valsartan) .Marland Kitchen.. 1tab By Mouth Once Daily 9)  Spiriva Handihaler 18 Mcg  Caps (Tiotropium Bromide Monohydrate) .... Inhale Contents of 1 Capsule Once A Day 10)  Oxygen 2l Liquid .... Continuous 11)  Glucosamine-Chondroitin   Caps (Glucosamine-Chondroit-Vit C-Mn) .... Take 1 Capsule By Mouth Once A Day 12)  Calcium Carbonate-Vitamin D 600-400 Mg-Unit  Tabs (Calcium Carbonate-Vitamin D) .... Take 1 Tablet By Mouth Two Times A Day 13)  Vitamin D 1000 Unit Tabs (Cholecalciferol) .... Take 1 Tablet By Mouth Once A Day 14)  Xopenex 1.25 Mg/26ml  Nebu (Levalbuterol Hcl) .... One Every 4 Hours As Needed 15)  Xopenex Hfa 45 Mcg/act  Aero (Levalbuterol Tartrate) .... Inhale 2 Puffs Every 4 Hours As Needed 16)  Tylenol Arthritis Pain 650 Mg Cr-Tabs (Acetaminophen) .... Per Bottle 17)  Mucinex Dm 30-600 Mg Xr12h-Tab (Dextromethorphan-Guaifenesin) .Marland Kitchen.. 1 Every  12 Hours As Needed 18)  Tramadol Hcl 50 Mg  Tabs (Tramadol Hcl) .... Take 1-2 Tablet By Mouth Every 4 Hours As  Needed For Cough 19)  Fexofenadine Hcl 180 Mg Tabs (Fexofenadine Hcl) .... Take 1 Tablet By Mouth Once A Day 20)  [facility]vs Saline Nasal Spray 0.65 %  Soln (Saline) .... 2 Puffs Every 4 Hours As Needed 21)  Afrin Nasal Spray 0.05 % Soln (Oxymetazoline Hcl) .Marland Kitchen.. 1 Puff Two Times A Day X5days 22)  Hydrocodone-Acetaminophen 7.5-325 Mg Tabs (Hydrocodone-Acetaminophen) .Marland Kitchen.. 1 By Mouth Q 6 Hrs As Needed Pain 23)  Cymbalta 60 Mg Cpep  (Duloxetine Hcl) .Marland Kitchen.. 1 By Mouth Once Daily  Current Medications (verified): 1)  Furosemide 40 Mg  Tabs (Furosemide) .... Take 1 Tablet By Mouth Once A Day and As Needed For Excessive Swelling 2)  Coumadin 7.5 Mg Tabs (Warfarin Sodium) .... Take Asd 1 Tablet By Mouth Once A Day 3)  Klor-Con 10 10 Meq Tbcr (Potassium Chloride) .... Take 2 Tabs Every Am and 1 Tab At Bedtime 4)  Omeprazole 20 Mg Cpdr (Omeprazole) .Marland Kitchen.. 1 By Mouth 30 Min Before 1st Meal 5)  Propafenone Hcl 325 Mg Xr12h-Cap (Propafenone Hcl) .Marland Kitchen.. 1 Tab Daily 6)  Nasonex 50 Mcg/act Susp (Mometasone Furoate) .... 2 Puffs Daily 7)  Symbicort 160-4.5 Mcg/act  Aero (Budesonide-Formoterol Fumarate) .... Two Puffs Twice Daily 8)  Exforge 10-320 Mg  Tabs (Amlodipine Besylate-Valsartan) .Marland Kitchen.. 1tab By Mouth Once Daily 9)  Spiriva Handihaler 18 Mcg  Caps (Tiotropium Bromide Monohydrate) .... Inhale Contents of 1 Capsule Once A Day 10)  Oxygen 2l Liquid .... Continuous 11)  Glucosamine-Chondroitin   Caps (Glucosamine-Chondroit-Vit C-Mn) .... Take 1 Capsule By Mouth Once A Day 12)  Calcium Carbonate-Vitamin D 600-400 Mg-Unit  Tabs (Calcium Carbonate-Vitamin D) .... Take 1 Tablet By Mouth Two Times A Day 13)  Vitamin D 1000 Unit Tabs (Cholecalciferol) .... Take 1 Tablet By Mouth Once A Day 14)  Xopenex 1.25 Mg/18ml  Nebu (Levalbuterol Hcl) .... One Every 4 Hours As Needed 15)  Xopenex Hfa 45 Mcg/act  Aero (Levalbuterol Tartrate) .... Inhale 2 Puffs Every 4 Hours As Needed 16)  Tylenol Arthritis Pain 650 Mg Cr-Tabs (Acetaminophen) .... Per Bottle 17)  Mucinex Dm 30-600 Mg Xr12h-Tab (Dextromethorphan-Guaifenesin) .Marland Kitchen.. 1 Every 12 Hours As Needed 18)  Tramadol Hcl 50 Mg  Tabs (Tramadol Hcl) .... Take 1-2 Tablet By Mouth Every 4 Hours As  Needed For Cough 19)  Fexofenadine Hcl 180 Mg Tabs (Fexofenadine Hcl) .... Take 1 Tablet By Mouth Once A Day 20)  Cvs Saline Nasal Spray 0.65 %  Soln (Saline) .... 2 Puffs Every 4 Hours As Needed 21)  Afrin Nasal Spray  0.05 % Soln (Oxymetazoline Hcl) .Marland Kitchen.. 1 Puff Two Times A Day X5days 22)  Hydrocodone-Acetaminophen 7.5-325 Mg Tabs (Hydrocodone-Acetaminophen) .Marland Kitchen.. 1 By Mouth Q 6 Hrs As Needed Pain 23)  Cymbalta 60 Mg Cpep (Duloxetine Hcl) .Marland Kitchen.. 1 By Mouth Once Daily 24)  Fluoxetine Hcl 40 Mg Caps (Fluoxetine Hcl) .... Take 1 Capsule By Mouth Once A Day  Allergies (verified): 1)  ! Pcn  Past History:  Past Medical History: Last updated: 04/18/2009 CHRONIC RHINITIS (ICD-472.0) EDEMA (ICD-782.3) ACUTE AND CHRONIC RESPIRATORY FAILURE (ICD-518.84) MORBID OBESITY (ICD-278.01)     - Target wt  =   179  for BMI < 30 peak wt 282     - Referred back to nutrition December 18, 2007  DEEP VENOUS THROMBOPHLEBITIS, HX OF (ICD-V12.52) COPD (ICD-496)........................................................Marland KitchenWert     -  Pft's 07/25/06 FEV1 55% ratio 53, DLC0 51% DEPRESSION (ICD-311) OSTEOPOROSIS (ICD-733.00) last BMD 4/08 -2.4, intolerant of bisphosphonates  HYPERTENSION (ICD-401.9) Atrial fibrillation - paroxysmal Anticoagulation therapy Pulmonary embolism, hx of neg stress myoview 10/07 Anxiety glucose intolerance - on steroids Hyperlipidemia Complex medical regimen    - Med calendar done 05/01/2008  Past Surgical History: Last updated: 04/15/2008 Hysterectomy Tonsillectomy Skin Graft to midde R finger-1975 s/p left arm fracture with fall off chair  Family History: Last updated: 07/07/2007 asthma and brothers and son.   Heart disease in mother.   Throat cancer in brother.  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 No Comments Medications reviewed with patient Daytime contact number verified with patient. Boone Master CNA/MA  July 17, 2009 11:01 AM    Physical Exam  Additional Exam:  Ambulatory healthy appearing in no acute distress.   280 February 07, 2008 > 284 March 20, 2008 >275 April 09, 2008 >>259 May 01, 2008>   267 April 18, 2009 >264 July 17, 2009 HEENT: nl dentition, turbinates, and orophanx. Nl external ear canals without cough reflex Neck without JVD/Nodes/TM Lungs coarse BS w/ few rhonchi RRR no m/r/g no edema Abd soft and benign with limited excursion in the supine position.  Ext warm without calf tenderness, cyanosis clubbing or edema Skin warm and dry without lesions     Impression & Recommendations:  Problem # 1:  OBSTRUCTIVE CHRONIC BRONCHITIS WITH EXACERBATION (ICD-491.21)  Avelox 400mg  once daily for 7 days Prednisone taper over next week.  Mucinex DM two times a day as needed cough/congestion Increase fluids.  Please contact office for sooner follow up if symptoms do not improve or worsen  follow up Dr. Sherene Sires 2 months   Orders: Est. Patient Level IV ([address]109)  Medications Added to Medication List This Visit: 1)  Klor-con 10 10 Meq Tbcr (Potassium chloride) .... Take 2 tabs every am and 1 tab at bedtime 2)  Fluoxetine Hcl 40 Mg Caps (Fluoxetine hcl) .... Take 1 capsule by mouth once a day 3)  Avelox 400 Mg Tabs (Moxifloxacin hcl) .Marland Kitchen.. 1 by mouth once daily 4)  Prednisone 10 Mg Tabs (Prednisone) .... 4 tabs for 2 days, then 3 tabs for 2 days, 2 tabs for 2 days, then 1 tab for 2 days, then stop  Complete Medication List: 1)  Furosemide 40 Mg  Tabs (Furosemide) .... Take 1 tablet by mouth once a day and as needed for excessive swelling 2)  Coumadin 7.5 Mg Tabs (Warfarin sodium) .... Take asd 1 tablet by mouth once a day 3)  Klor-con  10 10 Meq Tbcr (Potassium chloride) .... Take 2 tabs every am and 1 tab at bedtime 4)  Omeprazole 20 Mg Cpdr (Omeprazole) .Marland Kitchen.. 1 by mouth 30 min before 1st meal 5)  Propafenone Hcl 325 Mg Xr12h-cap (Propafenone hcl) .Marland Kitchen.. 1 tab daily 6)  Nasonex 50 Mcg/act Susp (Mometasone furoate) .... 2 puffs daily 7)  Symbicort 160-4.5 Mcg/act Aero (Budesonide-formoterol fumarate) .... Two puffs twice daily 8)  Exforge 10-320 Mg Tabs (Amlodipine besylate-valsartan) .Marland Kitchen.. 1tab by mouth once daily 9)  Spiriva Handihaler 18 Mcg Caps (Tiotropium bromide monohydrate) .... Inhale contents of 1 capsule once a day 10)  Oxygen 2l Liquid  .... Continuous 11)  Glucosamine-chondroitin Caps (Glucosamine-chondroit-vit c-mn) .... Take 1 capsule by mouth once a day 12)  Calcium Carbonate-vitamin D 600-400 Mg-unit Tabs (Calcium carbonate-vitamin d) .... Take 1 tablet by mouth two times a day 13)  Vitamin D 1000 Unit Tabs (Cholecalciferol) .... Take 1 tablet by mouth once a day 14)  Xopenex 1.25 Mg/22ml Nebu (Levalbuterol hcl) .... One every 4 hours as needed 15)  Xopenex Hfa 45 Mcg/act Aero (Levalbuterol tartrate) .... Inhale 2 puffs every 4 hours as needed 16)  Tylenol Arthritis Pain 650 Mg Cr-tabs (Acetaminophen) .... Per bottle 17)  Mucinex Dm 30-600 Mg Xr12h-tab (Dextromethorphan-guaifenesin) .Marland Kitchen.. 1 every 12 hours as needed 18)  Tramadol Hcl 50 Mg Tabs (Tramadol hcl) .... Take 1-2 tablet by mouth every 4 hours as  needed for cough 19)  Fexofenadine Hcl 180 Mg Tabs (Fexofenadine hcl) .... Take 1 tablet by mouth once a day 20)  [facility]vs Saline Nasal Spray 0.65 % Soln (Saline) .... 2 puffs every 4 hours as needed 21)  Afrin Nasal Spray 0.05 % Soln (Oxymetazoline hcl) .Marland Kitchen.. 1 puff two times a day x5days 22)  Hydrocodone-acetaminophen 7.5-325 Mg  Tabs (Hydrocodone-acetaminophen) .Marland Kitchen.. 1 by mouth q 6 hrs as needed pain 23)  Cymbalta 60 Mg Cpep (Duloxetine hcl) .Marland Kitchen.. 1 by mouth once daily 24)  Fluoxetine Hcl 40 Mg Caps (Fluoxetine hcl) .... Take 1 capsule by mouth once a day 25)  Avelox 400 Mg Tabs (Moxifloxacin hcl) .Marland Kitchen.. 1 by mouth once daily 26)  Prednisone 10 Mg Tabs (Prednisone) .... 4 tabs for 2 days, then 3 tabs for 2 days, 2 tabs for 2 days, then 1 tab for 2 days, then stop  Patient Instructions: 1)  Avelox 400mg  once daily for 7 days 2)  Prednisone taper over next week.  3)  Mucinex DM two times a day as needed cough/congestion 4)  Increase fluids.  5)  Please contact office for sooner follow up if symptoms do not improve or worsen  6)  follow up Dr. Sherene Sires 2 months  Prescriptions: PREDNISONE 10 MG TABS (PREDNISONE) 4 tabs for 2 days, then 3 tabs for 2 days, 2 tabs for 2 days, then 1 tab for 2 days, then stop  #20 x 0   Entered and Authorized by:   Rubye Oaks NP   Signed by:   Rubye Oaks NP on 07/17/2009   Method used:   Electronically to        [facility] [phone]* (retail)       [address]       [place], [place]  [address]       Ph: [phone] or [phone]       Fax: [phone]   RxID:   [id number] AVELOX 400 MG TABS (MOXIFLOXACIN HCL) 1 by mouth once  daily  #7 x 0   Entered and Authorized by:   Rubye Oaks NP   Signed by:   Reef Achterberg NP on 07/17/2009   Method used:   [facility]        [facility]  [address] [phone]* (retail)       [address]       [place], [place]  [address]       Ph: [phone] or [phone]       Fax: [phone]   RxID:   [id number]     Appended Document: med calendar update    Clinical Lists Changes  Medications: Changed medication from FUROSEMIDE 40 MG  TABS (FUROSEMIDE) Take 1 tablet by mouth once a day and as needed for excessive swelling to FUROSEMIDE 40 MG  TABS (FUROSEMIDE)  Take 1 tablet by mouth once a day - Signed Changed medication from KLOR-CON 10 10 MEQ TBCR (POTASSIUM CHLORIDE) take 2 tabs every am and 1 tab at bedtime to KLOR-CON 10 10 MEQ TBCR (POTASSIUM CHLORIDE) Take 1 tablet by mouth two times a day - Signed Changed medication from CYMBALTA 60 MG CPEP (DULOXETINE HCL) 1 by mouth once daily to CYMBALTA 30 MG CPEP (DULOXETINE HCL) Take 1 capsule by mouth once a day - Signed Changed medication from NASONEX 50 MCG/ACT SUSP (MOMETASONE FUROATE) 2 puffs daily to NASONEX 50 MCG/ACT SUSP (MOMETASONE FUROATE) 2 puffs each nostril two times a day - Signed Changed medication from * OXYGEN 2L LIQUID continuous to * OXYGEN 2L/MIN continuous - Signed Changed medication from MUCINEX DM 30-600 MG XR12H-TAB (DEXTROMETHORPHAN-GUAIFENESIN) 1 every 12 hours as needed to MUCINEX DM 30-600 MG XR12H-TAB (DEXTROMETHORPHAN-GUAIFENESIN) 1 every 12 hours as needed w/ flutter - Signed Changed medication from TRAMADOL HCL 50 MG  TABS (TRAMADOL HCL) Take 1-2 tablet by mouth every 4 hours as  needed for cough to TRAMADOL HCL 50 MG  TABS (TRAMADOL HCL) Take 1 tablet by mouth every 4 hours as  needed for cough - Signed Added new medication of CHLOR-TRIMETON 4 MG TABS (CHLORPHENIRAMINE MALEATE) 1 every 4 hours as needed - Signed Changed medication from [facility] NASAL SPRAY 0.05 % SOLN (OXYMETAZOLINE HCL) 1 puff two times a day x5days to [facility] NASAL SPRAY 0.05 % SOLN (OXYMETAZOLINE HCL) 1 puff two times a day x5days - before nasonex Added new medication of FUROSEMIDE 40 MG TABS (FUROSEMIDE) 1 extra daily as needed Removed medication of FEXOFENADINE HCL 180 MG TABS (FEXOFENADINE HCL) Take 1 tablet by mouth once a day Removed medication of FLUOXETINE HCL 40 MG CAPS (FLUOXETINE HCL) Take 1 capsule by mouth once a day Removed medication of HYDROCODONE-ACETAMINOPHEN 7.5-325 MG TABS (HYDROCODONE-ACETAMINOPHEN) 1 by mouth q 6 hrs as needed pain      Appended Document: [place] Letter when patient  arrived from appt w/ TP, she brought a jury summons requesting that we fashion a letter so that she will not have to serve.  per TP, okay to do.  Boone Master CNA/MA  July 18, 2009 5:11 PM   letter done in EMR.  signed by TP.  copy of summons made and scanned into pt's chart.  excusal letter and summons mailed to [employer] [address] Sharee Pimple as addressed at top of the letter dated 7.8.11.  pt aware these have been mailed. Boone Master CNA/MA  July 18, 2009 5:33 PM  Clinical Lists Changes

## 2010-02-10 NOTE — Progress Notes (Signed)
Summary: [employer]  Phone Note Outgoing Call   Call placed by: Lanier Prude, [facility]),  November 04, 2009 8:53 AM Summary of Call: faxed ins verfi request to [employer] today......Marland Kitchenwill wait for benefit summary  Follow-up for Phone Call        pt informed per ins she has $25 co pay then 0 out of pocket and im initiating PA to complete form and fax back.  Will call her to inform when completed. Follow-up by: Lanier Prude, [facility]),  November 10, 2009 9:51 AM  Additional Follow-up for Phone Call Additional follow up Details #1::        form given to Dr. Jonny Ruiz to be completed Lanier Prude, [facility]),  November 11, 2009 11:38 AM  PA form faxed Lanier Prude, [facility])  November 11, 2009 1:28 PM     Additional Follow-up for Phone Call Additional follow up Details #2::    rec fax back from [facility] stating [employer] is approved from 11/11/2009 to 11/11/2010. left mess for pt to call back to inform of above and to sched inj Follow-up by: Lanier Prude, [facility]),  November 12, 2009 8:38 AM  Additional Follow-up for Phone Call Additional follow up Details #3:: Details for Additional Follow-up Action Taken: pt informed/transferred to scheduler  Additional Follow-up by: Lanier Prude, [facility]),  November 12, 2009 1:29 PM

## 2010-02-10 NOTE — Assessment & Plan Note (Signed)
Summary: hfu, copd exacerbation   PCP:  John  Chief Complaint:  hosp f/u - sob and cough is better - wants to get smaller oxygen tank -([facility]).  History of Present Illness:  84 yowf last smoked 2003 with   COPD with minimum asthmatic component with an FEV1 of 55%, a ratio of 53%, and a diffusing capacity 51% documented 07/25/06.  On 02 24 hours per day.  6/09  weighed 271 and it was felt she was as debilitated as much by obesity as by airflow obstruction. baseline = when well = walk to mother in law's house but stops at least once because it's uphill and frequently requiring rescue rx.  December 18, 2007 fu ov:  no change doe, leg swelling less. not needing rescue rx or any meds from as needed list.  no cough.   January 09, 2008 sick since 01/01/08  with green sputum left post pleuritic cp and diarrhea, aching all over with nausea and sob not relieved by as needed xopenex.  --admitted  January 22, 2008 --returns post hospital , admitted 12/29-01/15/07 for CAP -LLL , tx with Avelox (tot. 10 d)  Since discharge, doing better not back to baseline. -CXR improved but small residual density.   February 07, 2008--Complains that she has sore throat, hoarseness, cough, congestion, watery eyes, DOE, wheezing over last 3 days. Since last visit was improving and returned back to baseline  but 3 days ago, cough started again. green mucus w/ nasal discharge --Levaquin x 5 days.   March 01, 2008--Presents for a post hospitalization follow up. Admitted 02/23/08 for post op respiratory difficulties. she underwent Wrist surgery, post op dyspnea, cough/wheezing. Admitted w/ aggressive resp toilet and IV steroids. Discharged. 02/27/08 improved. Cough and dyspnea better. Currently on last few days of prednisone. Hydralazine was added to for b/p control. Doing better, arm is slowing improving but painful.     Updated Prior Medication List: FUROSEMIDE 40 MG  TABS (FUROSEMIDE) Take 1 tablet by mouth once a day  COUMADIN 7.5 MG TABS (WARFARIN SODIUM) Take asd 1 tablet by mouth once a day KLOR-CON 10 10 MEQ TBCR (POTASSIUM CHLORIDE) Take 2 tablets by mouth once a day FLUOXETINE HCL 40 MG CAPS (FLUOXETINE HCL) Take 1 tablet by mouth once a day [facility] OMEPRAZOLE 20 MG TBEC (OMEPRAZOLE) 1 tablet by mouth 30 minutes before the first meal RYTHMOL SR 325 MG CP12 (PROPAFENONE HCL) two times a day VERAMYST 27.5 MCG/SPRAY  SUSP (FLUTICASONE FUROATE) one spray two times a day SYMBICORT 160-4.5 MCG/ACT  AERO (BUDESONIDE-FORMOTEROL FUMARATE) Two puffs twice daily EXFORGE 10-320 MG  TABS (AMLODIPINE BESYLATE-VALSARTAN) 1tab by mouth once daily SPIRIVA HANDIHALER 18 MCG  CAPS (TIOTROPIUM BROMIDE MONOHYDRATE) Inhale contents of 1 capsule once a day * OXYGEN 2L continuous GLUCOSAMINE-CHONDROITIN   CAPS (GLUCOSAMINE-CHONDROIT-VIT C-MN) Take 1 capsule by mouth once a day CALCIUM CARBONATE-VITAMIN D 600-400 MG-UNIT  TABS (CALCIUM CARBONATE-VITAMIN D) Take 1 tablet by mouth two times a day VITAMIN D 1000 UNIT TABS (CHOLECALCIFEROL) Take 1 tablet by mouth once a day FORTEO 600 MCG/2.4ML SOLN (TERIPARATIDE (RECOMBINANT)) 20 micrograms subcutaneously once daily XOPENEX 1.25 MG/3ML  NEBU (LEVALBUTEROL HCL) one every 6 hours as needed XOPENEX HFA 45 MCG/ACT  AERO (LEVALBUTEROL TARTRATE) inhale 2 puffs every 4 hours as needed TYLENOL 325 MG  TABS (ACETAMINOPHEN) per bottle TRAMADOL HCL 50 MG  TABS (TRAMADOL HCL) Take 1 tablet by mouth every 4 hours as needed ZYRTEC ALLERGY 10 MG  TABS (CETIRIZINE HCL) one tab at bedtime  as needed [facility] SALINE NASAL SPRAY 0.65 %  SOLN (SALINE) 2 puffs every 4 hours as needed AFRIN NASAL SPRAY 0.05 % SOLN (OXYMETAZOLINE HCL) use asd 5 x per day HYDRALAZINE HCL 10 MG TABS (HYDRALAZINE HCL) 1po three times a day LEVAQUIN 750 MG TABS (LEVOFLOXACIN) 1 by mouth once daily PREDNISONE 10 MG TABS (PREDNISONE) Taper - one tab once daily  Current Allergies (reviewed today): ! PCN Past Medical History:     Reviewed history from 12/18/2007 and no changes required:       CHRONIC RHINITIS (ICD-472.0)       EDEMA (ICD-782.3)       ACUTE AND CHRONIC RESPIRATORY FAILURE (ICD-518.84)       MORBID OBESITY (ICD-278.01)           - Target wt  =   179  for BMI < 30            - Referred back to nutrition December 18, 2007        DEEP VENOUS THROMBOPHLEBITIS, HX OF (ICD-V12.52)       COPD (ICD-496)           - Pft's 07/25/06 FEV1 55% ratio 53, DLC0 51%       DEPRESSION (ICD-311)       OSTEOPOROSIS (ICD-733.00) last BMD 4/08 -2.4, intolerant of bisphosphonates        HYPERTENSION (ICD-401.9)       Atrial fibrillation - paroxysmal       Anticoagulation therapy       Pulmonary embolism, hx of       neg stress myoview 10/07       Anxiety       glucose intolerance - on steroids       Hyperlipidemia  Family History:    Reviewed history from 07/07/2007 and no changes required:       asthma and brothers and son.         Heart disease in mother.         Throat cancer in brother.  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   Risk Factors: Tobacco use:  quit    Year quit:  1998    Pack-years:  54yrs, 1ppd Alcohol use:  no  Colonoscopy History:    Date of Last Colonoscopy:  02/13/2004  Mammogram History:    Date of Last Mammogram:  01/12/2003  PAP Smear History:    Date of Last PAP Smear:  03/13/2007  Review of Systems      See HPI  Vital Signs:  Patient Profile:   60 Years Old Female Height:     66 inches O2 Sat:      98 % O2 treatment:    Oxygen Temp:     97.5 degrees F oral Pulse rate:   68 / minute BP sitting:   120 / 82  (right arm) Cuff size:   large  Vitals Entered By: Abigail Miyamoto RN (March 01, 2008 10:12 AM)             Is Patient Diabetic? No  Comments Medications reviewed with patient Abigail Miyamoto RN  March 01, 2008 10:20 AM  Physical Exam  Ambulatory healthy appearing in no acute distress. Afeb with normal vital signs    283 January 09, 2008 >.279 January 22, 2008 >>280 February 07, 2008  HEENT: nl dentition, turbinates, and orophanx. Nl external ear canals without cough reflex Neck without JVD/Nodes/TM Lungs coarse BS w/ no wheezing RRR no m/r/g Abd soft and benign with nl excursion in the supine position. No bruits or organomegaly Ext warm without calf tenderness, cyanosis clubbing or edema Skin warm and dry without lesions     Impression & Recommendations:  Problem # 1:  OBSTRUCTIVE CHRONIC BRONCHITIS WITH EXACERBATION (ICD-491.21) Post-op exacerbation. Improved w/ aggressive pulmonary toilet, steroid challange.  REC:  Taper steroids to off.  continue on meds.  follow up 2 weeks Dr. Sherene Sires    Orders: Est. Patient Level II [phone])   Problem # 2:       Medications Added to Medication List This Visit: 1)  Rythmol Sr 325 Mg Cp12 (Propafenone hcl) .... Two times a day 2)  Prednisone 10 Mg Tabs (Prednisone) .... Taper - one tab once daily  Complete Medication List: 1)  Furosemide 40 Mg Tabs (Furosemide) .... Take 1 tablet by mouth once a day 2)  Coumadin 7.5 Mg Tabs (Warfarin sodium) .... Take asd 1 tablet by mouth once a day 3)  Klor-con 10 10 Meq Tbcr (Potassium chloride) .... Take 2 tablets by mouth once a day 4)  Fluoxetine Hcl 40 Mg Caps (Fluoxetine hcl) .... Take 1 tablet by mouth once a day 5)  [facility] Omeprazole 20 Mg Tbec (Omeprazole) .Marland Kitchen.. 1 tablet by mouth 30 minutes before the first meal 6)  Rythmol Sr 325 Mg Cp12 (Propafenone hcl) .... Two times a day 7)  Veramyst 27.5 Mcg/spray Susp (Fluticasone furoate) .... One spray two times a day 8)  Symbicort 160-4.5 Mcg/act Aero (Budesonide-formoterol fumarate) .... Two puffs twice daily  9)  Exforge 10-320 Mg Tabs (Amlodipine besylate-valsartan) .Marland Kitchen.. 1tab by mouth once daily 10)  Spiriva Handihaler 18 Mcg Caps (Tiotropium bromide monohydrate) .... Inhale contents of 1 capsule once a day 11)  Oxygen 2l  .... Continuous 12)  Glucosamine-chondroitin Caps (Glucosamine-chondroit-vit c-mn) .... Take 1 capsule by mouth once a day 13)  Calcium Carbonate-vitamin D 600-400 Mg-unit Tabs (Calcium carbonate-vitamin d) .... Take 1 tablet by mouth two times a day 14)  Vitamin D 1000 Unit Tabs (Cholecalciferol) .... Take 1 tablet by mouth once a day 15)  Forteo 600 Mcg/2.62ml Soln (Teriparatide (recombinant)) .... 20 micrograms subcutaneously once daily 16)  Hydralazine Hcl 10 Mg Tabs (Hydralazine hcl) .Marland Kitchen.. 1po three times a day 17)  Xopenex 1.25 Mg/41ml Nebu (Levalbuterol hcl) .... One every 6 hours as needed 18)  Xopenex Hfa 45 Mcg/act Aero (Levalbuterol tartrate) .... Inhale 2 puffs every 4 hours as needed 19)  Tylenol 325 Mg Tabs (Acetaminophen) .... Per bottle 20)  Tramadol Hcl 50 Mg Tabs (Tramadol hcl) .... Take 1 tablet by mouth every 4 hours as needed 21)  Zyrtec Allergy 10 Mg Tabs (Cetirizine hcl) .... One tab at bedtime as needed 22)  [facility]vs Saline Nasal Spray 0.65 % Soln (Saline) .... 2 puffs every 4 hours as needed 23)  Afrin Nasal Spray 0.05 % Soln (Oxymetazoline hcl) .... Use asd 5 x per day 24)  Levaquin 750 Mg Tabs (Levofloxacin) .Marland Kitchen.. 1 by mouth once daily 25)  Prednisone 10 Mg Tabs (Prednisone) .... Taper - one tab once daily  Other Orders: DME Referral (DME)  Patient Instructions: 1)  follow up in 2  weeks Dr. Sherene Sires , We will check a chest xray that day 2)  Taper off of prednisone as directed.  3)  Please contact office for sooner follow up if symptoms do not improve or worsen  4)  .

## 2010-02-10 NOTE — Assessment & Plan Note (Signed)
Summary: [facility]   Primary Provider:  Jonny [name]  CC:  ROV.  History of Present Illness: [name] [name] is referred today by [name] [name] for evaluation of atrial flutter.  She is a pleasant middle aged woman with multiple medical problems.  She had remote, recurrent pulmonary emoboli, a h/o COPD, obesity and hypoventilation who is maintained on chronic home oxygen.  She has had atrial fibrillation in the past, and has been maintained on Rhythmol which appears to be controlling her atrial fibrillation.  The patient was seen by [name] [name] several weeks ago and found to be in atrial flutter with a rapid ventricular rate.  She spontaneously went back to NSR.  She still feels her heart racing at times.  She denies c/p.  She has chronic peripheral edema.  No syncope.  Current Medications (verified): 1)  Furosemide 40 Mg  Tabs (Furosemide) .... Take 1 Tablet By Mouth Once A Day 2)  Coumadin 7.5 Mg Tabs (Warfarin Sodium) .... Take Asd 1 Tablet By Mouth Once A Day 3)  Klor-Con 10 10 Meq Tbcr (Potassium Chloride) .... Take 2 Tabs Every Am and 1 Tab At Bedtime 4)  Fluoxetine Hcl 40 Mg Caps (Fluoxetine Hcl) .... Take 1 Tablet By Mouth Once A Day 5)  Omeprazole 20 Mg Cpdr (Omeprazole) .Marland Kitchen.. 1 By Mouth 30 Min Before 1st Meal 6)  Rythmol Sr 325 Mg Cp12 (Propafenone Hcl) .... Two Times A Day 7)  Veramyst 27.5 Mcg/spray  Susp (Fluticasone Furoate) .... One Spray Two Times A Day 8)  Symbicort 160-4.5 Mcg/act  Aero (Budesonide-Formoterol Fumarate) .... Two Puffs Twice Daily 9)  Exforge 10-320 Mg  Tabs (Amlodipine Besylate-Valsartan) .Marland Kitchen.. 1tab By Mouth Once Daily 10)  Spiriva Handihaler 18 Mcg  Caps (Tiotropium Bromide Monohydrate) .... Inhale Contents of 1 Capsule Once A Day 11)  Oxygen 2l Liquid .... Continuous 12)  Glucosamine-Chondroitin   Caps (Glucosamine-Chondroit-Vit C-Mn) .... Take 1 Capsule By Mouth Once A Day  13)  Calcium Carbonate-Vitamin D 600-400 Mg-Unit  Tabs (Calcium Carbonate-Vitamin D) .... Take 1 Tablet By Mouth Two Times A Day 14)  Vitamin D 1000 Unit Tabs (Cholecalciferol) .... Take 1 Tablet By Mouth Once A Day 15)  Forteo 600 Mcg/2.56ml Soln (Teriparatide (Recombinant)) .... 20 Micrograms Subcutaneously Once Daily 16)  Xopenex 1.25 Mg/73ml  Nebu (Levalbuterol Hcl) .... One Every 4 Hours As Needed 17)  Xopenex Hfa 45 Mcg/act  Aero (Levalbuterol Tartrate) .... Inhale 2 Puffs Every 4 Hours As Needed 18)  Tylenol Arthritis Pain 650 Mg Cr-Tabs (Acetaminophen) .... Per Bottle 19)  Mucinex Dm 30-600 Mg Xr12h-Tab (Dextromethorphan-Guaifenesin) .Marland Kitchen.. 1 Every 12 Hours As Needed 20)  Tramadol Hcl 50 Mg  Tabs (Tramadol Hcl) .... Take 1-2 Tablet By Mouth Every 4 Hours As  Needed For Cough 21)  Fexofenadine Hcl 180 Mg Tabs (Fexofenadine Hcl) .... Take 1 Tablet By Mouth Once A Day 22)  [facility]vs Saline Nasal Spray 0.65 %  Soln (Saline) .... 2 Puffs Every 4 Hours As Needed 23)  Afrin Nasal Spray 0.05 % Soln (Oxymetazoline Hcl) .Marland Kitchen.. 1 Puff Two Times A Day X5days 24)  Furosemide 40 Mg Tabs (Furosemide) .Marland Kitchen.. 1 Extra Daily As Needed  Allergies: 1)  ! Pcn  Past History:  Past Medical History: Last updated: 06/20/2008 CHRONIC RHINITIS (ICD-472.0) EDEMA (ICD-782.3) ACUTE AND CHRONIC RESPIRATORY FAILURE (ICD-518.84) MORBID OBESITY (ICD-278.01)     - Target wt  =   179  for BMI < 30 peak wt 282     - Referred back to  nutrition December 18, 2007  DEEP VENOUS THROMBOPHLEBITIS, HX OF (ICD-V12.52) COPD (ICD-496)     - Pft's 07/25/06 FEV1 55% ratio 53, DLC0 51% DEPRESSION (ICD-311) OSTEOPOROSIS (ICD-733.00) last BMD 4/08 -2.4, intolerant of bisphosphonates  HYPERTENSION (ICD-401.9) Atrial fibrillation - paroxysmal Anticoagulation therapy Pulmonary embolism, hx of neg stress myoview 10/07 Anxiety glucose intolerance - on steroids Hyperlipidemia Complex medical regimen    - Med calendar done 05/01/2008   Past Surgical History: Last updated: 04/15/2008 Hysterectomy Tonsillectomy Skin Graft to midde R finger-1975 s/p left arm fracture with fall off chair  Family History: Last updated: 07/07/2007 asthma and brothers and son.   Heart disease in mother.   Throat cancer in brother.  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  Vital Signs:  Patient profile:   60 year old female Height:      65 inches Weight:      255 pounds BMI:     42.59 Pulse rate:   82 / minute BP sitting:   144 / 75  (left arm) Cuff size:   large  Vitals Entered By: Stanton Kidney, EMT-P (July 01, 2008 9:54 AM)  Physical Exam  General:  Obese middle aged woman, NAD. Head:  normocephalic and atraumatic Eyes:  PERRLA/EOM intact; conjunctiva and lids normal. Mouth:  Poor dentition. Oral mucosa normal. Neck:  Neck supple, no JVD. No masses, thyromegaly or abnormal cervical nodes. Lungs:  Decreased breath sounds throughout.  No wheezes, rhonchi, or rales.  No increased work of breathing. Heart:  Distant with an irregular rhythm.  No murmur, rub, or gallops appreciated.  PMI could not be appreciated. Abdomen:  Obese but otherwise normal exam. Msk:  Back normal, normal gait. Muscle strength and tone normal. Pulses:  pulses normal in all 4 extremities Extremities:  No clubbing or cyanosis.  1+ peripheral edema bilaterally. Neurologic:  Alert and oriented x 3.   EKG  Procedure date:  06/13/2008  Findings:      Atrial flutter with a ventricular response rate of: 139   Impression & Recommendations:  Problem # 1:  ATRIAL FLUTTER (ICD-427.32)  I have discussed the treatment options with the patient.  The risks, benefits, goals and expectations of atrial flutter ablation have been discussed and she wishes to proceed.  The patient was not recently therapeutic with her INR though I think she is in NSR today.  Will schedule ablation in the next several weeks.  Continue current meds.  With her h/o atrial fibrillation, I would anticipate that she remain of her meds as noted below. Her updated medication list for this problem includes:    Coumadin 7.5 Mg Tabs (Warfarin sodium) .Marland Kitchen... Take asd 1 tablet by mouth once a day    Rythmol Sr 325 Mg Cp12 (Propafenone hcl) .Marland Kitchen..Marland Kitchen Two times a day  Problem # 2:  ATRIAL FIBRILLATION (ICD-427.31) She has maintained NSR or flutter on Rythmol.  No atrial fibrillation.  Continue meds as noted below. Her updated medication list for this problem includes:    Coumadin 7.5 Mg Tabs (Warfarin sodium) .Marland Kitchen... Take asd 1 tablet by mouth once a day    Rythmol Sr 325 Mg Cp12 (Propafenone hcl) .Marland Kitchen..Marland Kitchen Two times a day

## 2010-02-10 NOTE — Assessment & Plan Note (Signed)
Summary: med cal/apc   Primary Provider/Referring Provider:  Jonny Ruiz  CC:  follow up and med review. .  History of Present Illness: 60 yowf last smoked 2003 with   COPD with minimum asthmatic component with an FEV1 of 55%, a ratio of 53%, and a diffusing capacity 51% documented 07/25/06.  On 02 24 hours per day at 2lpm  6/09  weighed 271 and it was felt she was as debilitated as much by obesity as by airflow obstruction. baseline = when well = walk to mother in law's house but stops at least once because it's uphill and frequently requiring rescue rx.  January 22, 2008 --returns post hospital , admitted 12/29-01/15/07 for CAP -LLL , tx with Avelox (tot. 10 d)  Since discharge, doing better not back to baseline. -CXR improved but small residual density.   February 07, 2008--Complains that she has sore throat, hoarseness, cough, congestion, watery eyes, DOE, wheezing over last 3 days. Since last visit was improving and returned back to baseline  but 3 days ago, cough started again. green mucus w/ nasal discharge --Levaquin x 5 days.   March 01, 2008--Presents for a post hospitalization follow up. Admitted 02/23/08 for post op respiratory difficulties. she underwent  left Wrist surgery, post op dyspnea, cough/wheezing. Admitted w/ aggressive resp toilet and IV steroids. Discharged. 02/27/08 improved.   March 20, 2008 ov for fu cxr with no change in chronic doe and minimal cough.    April 09, 2008 --Present for an acute office visit-Complains of productive cough with green phlegm x 4 days. Pt has taken Zyrtec and mucinex x 4 days with no relief.    May 01, 2008--Presents for follow up and med review. est med calendar -  Since last visit doing much better. Cough, and congestion is better. Has watery eye and sneezing w/ all pollen outside. Meds are correct w/ list. Is using wt watchers, lost 20# over last 6 weeks. Denies chest pain, dyspnea, orthopnea, hemoptysis, fever, n/v/d, edema, headache.    Medications Prior to Update: 1)  Furosemide 40 Mg  Tabs (Furosemide) .... Take 1 Tablet By Mouth Once A Day 2)  Coumadin 7.5 Mg Tabs (Warfarin Sodium) .... Take Asd 1 Tablet By Mouth Once A Day 3)  Klor-Con 10 10 Meq Tbcr (Potassium Chloride) .... Take 2 Tablets By Mouth Once A Day 4)  Fluoxetine Hcl 40 Mg Caps (Fluoxetine Hcl) .... Take 1 Tablet By Mouth Once A Day 5)  Cvs Omeprazole 20 Mg Tbec (Omeprazole) .Marland Kitchen.. 1 Tablet By Mouth 30 Minutes Before The First Meal 6)  Rythmol Sr 325 Mg Cp12 (Propafenone Hcl) .... Two Times A Day 7)  Veramyst 27.5 Mcg/spray  Susp (Fluticasone Furoate) .... One Spray Two Times A Day 8)  Symbicort 160-4.5 Mcg/act  Aero (Budesonide-Formoterol Fumarate) .... Two Puffs Twice Daily 9)  Exforge 10-320 Mg  Tabs (Amlodipine Besylate-Valsartan) .Marland Kitchen.. 1tab By Mouth Once Daily 10)  Spiriva Handihaler 18 Mcg  Caps (Tiotropium Bromide Monohydrate) .... Inhale Contents of 1 Capsule Once A Day 11)  Oxygen 2l Liquid .... Continuous 12)  Glucosamine-Chondroitin   Caps (Glucosamine-Chondroit-Vit C-Mn) .... Take 1 Capsule By Mouth Once A Day 13)  Calcium Carbonate-Vitamin D 600-400 Mg-Unit  Tabs (Calcium Carbonate-Vitamin D) .... Take 1 Tablet By Mouth Two Times A Day 14)  Vitamin D 1000 Unit Tabs (Cholecalciferol) .... Take 1 Tablet By Mouth Once A Day 15)  Forteo 600 Mcg/2.62ml Soln (Teriparatide (Recombinant)) .... 20 Micrograms Subcutaneously Once Daily 16)  Xopenex 1.25 Mg/102ml  Nebu (Levalbuterol Hcl) .... One Every 6 Hours As Needed 17)  Xopenex Hfa 45 Mcg/act  Aero (Levalbuterol Tartrate) .... Inhale 2 Puffs Every 4 Hours As Needed 18)  Tylenol 325 Mg  Tabs (Acetaminophen) .... Per Bottle 19)  Tramadol Hcl 50 Mg  Tabs (Tramadol Hcl) .... Take 1 Tablet By Mouth Every 4 Hours As Needed 20)  Zyrtec Allergy 10 Mg  Tabs (Cetirizine Hcl) .... One Tab At Bedtime As Needed 21)  Cvs Saline Nasal Spray 0.65 %  Soln (Saline) .... 2 Puffs Every 4 Hours As Needed  22)  Afrin Nasal Spray 0.05 % Soln (Oxymetazoline Hcl) .... Use Asd 5 X Per Day 23)  Mucinex Dm 30-600 Mg Xr12h-Tab (Dextromethorphan-Guaifenesin) .... Take Prn 24)  Vicodin 5-500 Mg Tabs (Hydrocodone-Acetaminophen) .Marland Kitchen.. 1 Q4h As Needed Pain 25)  Avelox 400 Mg Tabs (Moxifloxacin Hcl) .Marland Kitchen.. 1 By Mouth Once Daily  Current Medications (verified): 1)  Furosemide 40 Mg  Tabs (Furosemide) .... Take 1 Tablet By Mouth Once A Day 2)  Coumadin 7.5 Mg Tabs (Warfarin Sodium) .... Take Asd 1 Tablet By Mouth Once A Day 3)  Klor-Con 10 10 Meq Tbcr (Potassium Chloride) .... Take 2 Tablets By Mouth Once A Day 4)  Fluoxetine Hcl 40 Mg Caps (Fluoxetine Hcl) .... Take 1 Tablet By Mouth Once A Day 5)  Cvs Omeprazole 20 Mg Tbec (Omeprazole) .Marland Kitchen.. 1 Tablet By Mouth 30 Minutes Before The First Meal 6)  Rythmol Sr 325 Mg Cp12 (Propafenone Hcl) .... Two Times A Day 7)  Veramyst 27.5 Mcg/spray  Susp (Fluticasone Furoate) .... One Spray Two Times A Day 8)  Symbicort 160-4.5 Mcg/act  Aero (Budesonide-Formoterol Fumarate) .... Two Puffs Twice Daily 9)  Exforge 10-320 Mg  Tabs (Amlodipine Besylate-Valsartan) .Marland Kitchen.. 1tab By Mouth Once Daily 10)  Spiriva Handihaler 18 Mcg  Caps (Tiotropium Bromide Monohydrate) .... Inhale Contents of 1 Capsule Once A Day 11)  Oxygen 2l Liquid .... Continuous 12)  Glucosamine-Chondroitin   Caps (Glucosamine-Chondroit-Vit C-Mn) .... Take 1 Capsule By Mouth Once A Day 13)  Calcium Carbonate-Vitamin D 600-400 Mg-Unit  Tabs (Calcium Carbonate-Vitamin D) .... Take 1 Tablet By Mouth Two Times A Day 14)  Vitamin D 1000 Unit Tabs (Cholecalciferol) .... Take 1 Tablet By Mouth Once A Day 15)  Forteo 600 Mcg/2.51ml Soln (Teriparatide (Recombinant)) .... 20 Micrograms Subcutaneously Once Daily 16)  Xopenex 1.25 Mg/16ml  Nebu (Levalbuterol Hcl) .... One Every 6 Hours As Needed 17)  Xopenex Hfa 45 Mcg/act  Aero (Levalbuterol Tartrate) .... Inhale 2 Puffs Every 4 Hours As Needed  18)  Tylenol 325 Mg  Tabs (Acetaminophen) .... Per Bottle 19)  Tramadol Hcl 50 Mg  Tabs (Tramadol Hcl) .... Take 1 Tablet By Mouth Every 4 Hours As Needed 20)  Zyrtec Allergy 10 Mg  Tabs (Cetirizine Hcl) .... One Tab At Bedtime As Needed 21)  Cvs Saline Nasal Spray 0.65 %  Soln (Saline) .... 2 Puffs Every 4 Hours As Needed 22)  Afrin Nasal Spray 0.05 % Soln (Oxymetazoline Hcl) .... Use Asd 5 X Per Day 23)  Mucinex Dm 30-600 Mg Xr12h-Tab (Dextromethorphan-Guaifenesin) .... Take Prn 24)  Vicodin 5-500 Mg Tabs (Hydrocodone-Acetaminophen) .Marland Kitchen.. 1 Q4h As Needed Pain 25)  Avelox 400 Mg Tabs (Moxifloxacin Hcl) .Marland Kitchen.. 1 By Mouth Once Daily  Allergies (verified): 1)  ! Pcn  Past History:  Past Medical History:    CHRONIC RHINITIS (ICD-472.0)    EDEMA (ICD-782.3)    ACUTE AND CHRONIC RESPIRATORY FAILURE (ICD-518.84)    MORBID  OBESITY (ICD-278.01)        - Target wt  =   179  for BMI < 30         - Referred back to nutrition December 18, 2007     DEEP VENOUS THROMBOPHLEBITIS, HX OF (ICD-V12.52)    COPD (ICD-496)        - Pft's 07/25/06 FEV1 55% ratio 53, DLC0 51%    DEPRESSION (ICD-311)    OSTEOPOROSIS (ICD-733.00) last BMD 4/08 -2.4, intolerant of bisphosphonates     HYPERTENSION (ICD-401.9)    Atrial fibrillation - paroxysmal    Anticoagulation therapy    Pulmonary embolism, hx of    neg stress myoview 10/07    Anxiety    glucose intolerance - on steroids    Hyperlipidemia (12/18/2007)  Past Surgical History:    Hysterectomy    Tonsillectomy    Skin Graft to midde R finger-1975    s/p left arm fracture with fall off chair (04/15/2008)  Family History:    asthma and brothers and son.      Heart disease in mother.      Throat cancer in brother. 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)  Risk Factors:    Alcohol Use: N/A     >5 drinks/d w/in last 3 months: N/A    Caffeine Use: N/A    Diet: N/A    Exercise: N/A  Risk Factors:    Smoking Status: quit (02/07/2008)    Packs/Day: N/A    Cigars/wk: N/A    Pipe Use/wk: N/A    Cans of tobacco/wk: N/A    Passive Smoke Exposure: N/A  Review of Systems      See HPI  Vital Signs:  Patient profile:   60 year old female Height:      65 inches Weight:      259 pounds BMI:     43.26 O2 Sat:      98 % Temp:     97.1 degrees F oral Pulse rate:   66 / minute BP sitting:   122 / 70  (left arm) Cuff size:   large  Vitals Entered By: Boone Master CNA (May 01, 2008 9:17 AM)  O2 Sat at Rest %:  98 O2 Flow:  2 L CC: follow up and med review.  Is Patient Diabetic? Yes  Comments Medications reviewed with patient Boone Master CNA  May 01, 2008 9:21 AM    Physical Exam  Additional Exam:  Ambulatory healthy appearing in no acute distress. Afeb with normal vital signs     280 February 07, 2008 > 284 March 20, 2008 >275 April 09, 2008 >>259 May 01, 2008 10:03 AM  HEENT: nl dentition, turbinates, and orophanx. Nl external ear canals without cough reflex Neck without JVD/Nodes/TM Lungs coarse BS w/ few rhonchi RRR no m/r/g Abd soft and benign with nl excursion in the supine position. No bruits or organomegaly Ext warm without calf tenderness, cyanosis clubbing or edema Skin warm and dry without lesions     Impression & Recommendations:  Problem # 1:  ACUTE AND CHRONIC RESPIRATORY FAILURE (ICD-518.84)  Continue on same meds.  Congratulated on wt loss.   Orders: DME Referral (DME) Est. Patient Level III (16109)  Problem # 2:  COPD (ICD-496)  Recent flare- now resolved.  Meds reviewed with pt education and computerized med calendar completed/adjusted.     Complete Medication List: 1)  Furosemide 40 Mg Tabs (Furosemide) .... Take 1 tablet by mouth once a day 2)  Coumadin 7.5 Mg Tabs (Warfarin sodium) .... Take asd 1 tablet by mouth once a day  3)  Klor-con 10 10 Meq Tbcr (Potassium chloride) .... Take 2 tablets by mouth once a day 4)  Fluoxetine Hcl 40 Mg Caps (Fluoxetine hcl) .... Take 1 tablet by mouth once a day 5)  Cvs Omeprazole 20 Mg Tbec (Omeprazole) .Marland Kitchen.. 1 tablet by mouth 30 minutes before the first meal 6)  Rythmol Sr 325 Mg Cp12 (Propafenone hcl) .... Two times a day 7)  Veramyst 27.5 Mcg/spray Susp (Fluticasone furoate) .... One spray two times a day 8)  Symbicort 160-4.5 Mcg/act Aero (Budesonide-formoterol fumarate) .... Two puffs twice daily 9)  Exforge 10-320 Mg Tabs (Amlodipine besylate-valsartan) .Marland Kitchen.. 1tab by mouth once daily 10)  Spiriva Handihaler 18 Mcg Caps (Tiotropium bromide monohydrate) .... Inhale contents of 1 capsule once a day 11)  Oxygen 2l Liquid  .... Continuous 12)  Glucosamine-chondroitin Caps (Glucosamine-chondroit-vit c-mn) .... Take 1 capsule by mouth once a day 13)  Calcium Carbonate-vitamin D 600-400 Mg-unit Tabs (Calcium carbonate-vitamin d) .... Take 1 tablet by mouth two times a day 14)  Vitamin D 1000 Unit Tabs (Cholecalciferol) .... Take 1 tablet by mouth once a day 15)  Forteo 600 Mcg/2.38ml Soln (Teriparatide (recombinant)) .... 20 micrograms subcutaneously once daily 16)  Xopenex 1.25 Mg/34ml Nebu (Levalbuterol hcl) .... One every 6 hours as needed 17)  Xopenex Hfa 45 Mcg/act Aero (Levalbuterol tartrate) .... Inhale 2 puffs every 4 hours as needed 18)  Tylenol 325 Mg Tabs (Acetaminophen) .... Per bottle 19)  Tramadol Hcl 50 Mg Tabs (Tramadol hcl) .... Take 1 tablet by mouth every 4 hours as needed 20)  Zyrtec Allergy 10 Mg Tabs (Cetirizine hcl) .... One tab at bedtime as needed 21)  Cvs Saline Nasal Spray 0.65 % Soln (Saline) .... 2 puffs every 4 hours as needed 22)  Afrin Nasal Spray 0.05 % Soln (Oxymetazoline hcl) .... Use asd 5 x per day 23)  Mucinex Dm 30-600 Mg Xr12h-tab (Dextromethorphan-guaifenesin) .... Take prn  24)  Vicodin 5-500 Mg Tabs (Hydrocodone-acetaminophen) .Marland Kitchen.. 1 q4h as needed pain 25)  Avelox 400 Mg Tabs (Moxifloxacin hcl) .Marland Kitchen.. 1 by mouth once daily  Patient Instructions: 1)  Continue on same meds.  2)  Bring med list to each visit.  3)  follow up Dr. Sherene Sires in 6-8 weeks  4)  Please contact office for sooner follow up if symptoms do not improve or worsen  Prescriptions: SYMBICORT 160-4.5 MCG/ACT  AERO (BUDESONIDE-FORMOTEROL FUMARATE) Two puffs twice daily  #3 x 3   Entered and Authorized by:   Rubye Oaks NP   Signed by:   Lyda Colcord NP on 05/01/2008   Method used:   Faxed to ...       [facility]* (mail-order)             ,          Ph: [phone]       Fax: [phone]   RxID:   [id number] SYMBICORT 160-4.5 MCG/ACT  AERO (BUDESONIDE-FORMOTEROL FUMARATE) Two puffs twice daily  #3 x 3   Entered and Authorized by:   Rubye Oaks NP   Signed by:   Journey Ratterman NP on 05/01/2008   Method used:   Electronically  to        [facility]* (mail-order)             ,          Ph: [phone]       Fax: [phone]   RxID:   [id number]   Appended Document: med update Medications Added XOPENEX 1.25 MG/3ML  NEBU (LEVALBUTEROL HCL) one every 4 hours as needed TYLENOL ARTHRITIS PAIN 650 MG CR-TABS (ACETAMINOPHEN) per bottle MUCINEX DM 30-600 MG XR12H-TAB (DEXTROMETHORPHAN-GUAIFENESIN) 1 every 12 hours as needed AFRIN NASAL SPRAY 0.05 % SOLN (OXYMETAZOLINE HCL) 1 puff two times a day x5days FUROSEMIDE 40 MG TABS (FUROSEMIDE) 1 extra daily as needed          Clinical Lists Changes  Medications: Changed medication from XOPENEX 1.25 MG/3ML  NEBU (LEVALBUTEROL HCL) one every 6 hours as needed to XOPENEX 1.25 MG/3ML  NEBU (LEVALBUTEROL HCL) one every 4 hours as needed Changed medication from TYLENOL 325 MG  TABS (ACETAMINOPHEN) per bottle to TYLENOL ARTHRITIS PAIN 650 MG CR-TABS (ACETAMINOPHEN) per bottle  Changed medication from MUCINEX DM 30-600 MG XR12H-TAB (DEXTROMETHORPHAN-GUAIFENESIN) take prn to MUCINEX DM 30-600 MG XR12H-TAB (DEXTROMETHORPHAN-GUAIFENESIN) 1 every 12 hours as needed Changed medication from [facility] NASAL SPRAY 0.05 % SOLN (OXYMETAZOLINE HCL) use asd 5 x per day to [facility] NASAL SPRAY 0.05 % SOLN (OXYMETAZOLINE HCL) 1 puff two times a day x5days Removed medication of VICODIN 5-500 MG TABS (HYDROCODONE-ACETAMINOPHEN) 1 q4h as needed pain Removed medication of AVELOX 400 MG TABS (MOXIFLOXACIN HCL) 1 by mouth once daily Added new medication of FUROSEMIDE 40 MG TABS (FUROSEMIDE) 1 extra daily as needed

## 2010-02-10 NOTE — Assessment & Plan Note (Signed)
Summary: Pulmonary/ fu sob   Visit Type:  extended PCP:  John  Chief Complaint:  3 month followup. Pt denies any complaints today.Marland Kitchen  History of Present Illness:  60 yowf last smoked  with documented COPD with minimum asthmatic component with an FEV1 of 55%, a ratio of 53%, and a diffusing capacity 51% documented 07/25/06.  On 02 24 hours per day.  6/09  weighed 271 and it was felt she was as debilitated as much by obesityas by airflow obstruction. baseline = when well = walk to mother in law's house but stops at least once because it's uphill and frequently requiring rescue rx.  December 18, 2007 fu ov:  no change doe, leg swelling less. not needing rescue rx or any meds from as needed list.  no cough.  Pt denies any significant sore throat, nasal congestion or excess secretions, fever, chills, sweats, unintended wt loss, pleuritic or exertional cp, orthopnea pnd.  Pt also denies any obvious fluctuation in symptoms with weather or environmental change or other alleviating or aggravating factors.     Pt denies any increase in rescue therapy over baseline, denies waking up needing it or having early am exacerbations of coughing/wheezing/ or dyspnea         Updated Prior Medication List: FUROSEMIDE 40 MG  TABS (FUROSEMIDE) Take 1 tablet by mouth once a day COUMADIN 7.5 MG TABS (WARFARIN SODIUM) Take asd 1 tablet by mouth once a day KLOR-CON 10 10 MEQ TBCR (POTASSIUM CHLORIDE) Take 1 tablet by mouth once a day FLUOXETINE HCL 40 MG CAPS (FLUOXETINE HCL) Take 1 tablet by mouth once a day CVS OMEPRAZOLE 20 MG TBEC (OMEPRAZOLE) 1 tablet by mouth 30 minutes before the first meal RYTHMOL SR 325 MG CP12 (PROPAFENONE HCL) two times a day VERAMYST 27.5 MCG/SPRAY  SUSP (FLUTICASONE FUROATE) one spray two times a day SYMBICORT 160-4.5 MCG/ACT  AERO (BUDESONIDE-FORMOTEROL FUMARATE) Two puffs twice daily EXFORGE 10-320 MG  TABS (AMLODIPINE BESYLATE-VALSARTAN) 1tab by mouth once daily  SPIRIVA HANDIHALER 18 MCG  CAPS (TIOTROPIUM BROMIDE MONOHYDRATE) Inhale contents of 1 capsule once a day * OXYGEN 2L continuous GLUCOSAMINE-CHONDROITIN   CAPS (GLUCOSAMINE-CHONDROIT-VIT C-MN) Take 1 capsule by mouth once a day CALCIUM CARBONATE-VITAMIN D 600-400 MG-UNIT  TABS (CALCIUM CARBONATE-VITAMIN D) Take 1 tablet by mouth two times a day VITAMIN D 1000 UNIT TABS (CHOLECALCIFEROL) Take 1 tablet by mouth once a day FORTEO 600 MCG/2.4ML SOLN (TERIPARATIDE (RECOMBINANT)) 20 micrograms subcutaneously once daily XOPENEX 1.25 MG/3ML  NEBU (LEVALBUTEROL HCL) one every 6 hours as needed XOPENEX HFA 45 MCG/ACT  AERO (LEVALBUTEROL TARTRATE) inhale 2 puffs every 4 hours as needed TYLENOL 325 MG  TABS (ACETAMINOPHEN) per bottle TRAMADOL HCL 50 MG  TABS (TRAMADOL HCL) Take 1 tablet by mouth every 4 hours as needed ZYRTEC ALLERGY 10 MG  TABS (CETIRIZINE HCL) one tab at bedtime as needed CVS SALINE NASAL SPRAY 0.65 %  SOLN (SALINE) 2 puffs every 4 hours as needed AFRIN NASAL SPRAY 0.05 % SOLN (OXYMETAZOLINE HCL) use asd 5 x per day HYDRALAZINE HCL 10 MG TABS (HYDRALAZINE HCL) 1po three times a day TOBRAMYCIN SULFATE 0.3 % SOLN (TOBRAMYCIN SULFATE) 2 gtt  both eye qid for 10 days  Current Allergies (reviewed today): ! PCN  Past Medical History:    CHRONIC RHINITIS (ICD-472.0)    EDEMA (ICD-782.3)    ACUTE AND CHRONIC RESPIRATORY FAILURE (ICD-518.84)    MORBID OBESITY (ICD-278.01)        - Target wt  =  179  for BMI < 30         - Referred back to nutrition December 18, 2007     DEEP VENOUS THROMBOPHLEBITIS, HX OF (ICD-V12.52)    COPD (ICD-496)        - Pft's 07/25/06 FEV1 55% ratio 53, DLC0 51%    DEPRESSION (ICD-311)    OSTEOPOROSIS (ICD-733.00) last BMD 4/08 -2.4, intolerant of bisphosphonates     HYPERTENSION (ICD-401.9)    Atrial fibrillation - paroxysmal    Anticoagulation therapy    Pulmonary embolism, hx of    neg stress myoview 10/07    Anxiety    glucose intolerance - on steroids     Hyperlipidemia   Family History:    Reviewed history from 07/07/2007 and no changes required:       asthma and brothers and son.         Heart disease in mother.         Throat cancer in brother.  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     Vital Signs:  Patient Profile:   60 Years Old Female Height:     66 inches Weight:      283 pounds O2 Sat:      98 % O2 treatment:    2 liters  Temp:     98.0 degrees F oral Pulse rate:   87 / minute BP sitting:   142 / 80  (left arm)  Vitals Entered By: Vernie Murders (December 18, 2007 11:04 AM)                 Physical Exam    Ambulatory healthy appearing in no acute distress. Afeb with normal vital signs  wt  284 > 283 December 18, 2007 11:30 AM  HEENT: nl dentition, turbinates, and orophanx. Nl external ear canals without cough reflex Neck without JVD/Nodes/TM Lungs clear to A and P bilaterally without cough on insp or exp maneuvers RRR no s3 or murmur or increase in P2 Abd soft and benign with nl excursion in the supine position. No bruits or organomegaly Ext warm without calf tenderness, cyanosis clubbing or edema Skin warm and dry without lesions        Impression & Recommendations:  Problem # 1:  COPD (ICD-496)  I spent extra time with the patient today explaining optimal mdi  technique.  This improved from  50-75%   Each maintenance medication was reviewed in detail including most importantly the difference between maintenance and as needed and under what circumstances the prns are to be used. This was done in the context of a medication calendar review which provided the patient with a user-friendly unambiguous mechanism for medication administration and reconciliation. It is critical that this be shown to every doctor she sees for modification during the office  visit if necessary so the patient can use it as a working document.   Her updated medication list for this problem includes:    Symbicort 160-4.5 Mcg/act Aero (Budesonide-formoterol fumarate) .Marland Kitchen..Marland Kitchen Two puffs twice daily    Spiriva Handihaler 18 Mcg Caps (Tiotropium bromide monohydrate) ..... Inhale contents of 1 capsule once a day    Xopenex 1.25 Mg/60ml Nebu (Levalbuterol hcl) ..... One every 6 hours as needed    Xopenex Hfa 45 Mcg/act Aero (Levalbuterol tartrate) ..... Inhale 2 puffs every 4 hours as needed   Problem # 2:  ACUTE AND CHRONIC RESPIRATORY FAILURE (ICD-518.84) This is more related to obesity than to copd but now remains 02 dep 24 hours per day. Orders: Est. Patient Level IV (16109)   Problem # 3:  MORBID OBESITY (ICD-278.01)  Weight control is a matter of calorie balance which needs to be tilted in the pt's favor by eating less and exercising more.  Specifically, I recommended  exercise at a level where pt  is short of breath but not out of breath 30 minutes daily.  If not losing weight on this program, I would strongly recommend pt see a nutritionist with a food diary recorded for two weeks prior to the visit.      Patient Instructions: 1)  Walk 30 min daily on 0xygen at level where short of breath never out of breath. 2)  Call your nutritionist to set up an appt with food diary.  3)  Please schedule a follow-up appointment in 3 months with [name] 4)  See calendar for specific medication instructions and bring it back for each and every office visit for every healthcare provider you see.  Without it,  you may not receive the best quality medical care that we feel you deserve.    Prescriptions: [facility] OMEPRAZOLE 20 MG TBEC (OMEPRAZOLE) 1 tablet by mouth 30 minutes before the first meal  #90 x 0   Entered and Authorized by:   Nyoka Cowden MD   Signed by:   Nyoka Cowden MD on 12/18/2007   Method used:   Print then Give to Patient   RxID:   [id number]  ]

## 2010-02-10 NOTE — Medication Information (Signed)
Summary: ccr/at  Anticoagulant Therapy  Managed by: Cloyde Reams, RN, BSN Referring MD: Valera Castle MD PCP: Corwin Levins MD Supervising MD: Mariah Milling Indication 1: Pulmonary embolism (ICD-415.19) Lab Used: [facility] Site: [place] INR POC 2.9 INR RANGE 2 - 3  Dietary changes: no     Bleeding/hemorrhagic complications: no     Any changes in medication regimen? yes       Details: started cymbalta 6/8   Any missed doses?: no       Is patient compliant with meds? yes       Allergies: 1)  ! Pcn  Anticoagulation Management History:      The patient is taking warfarin and comes in today for a routine follow up visit.  Negative risk factors for bleeding include an age less than 62 years old.  The bleeding index is 'low risk'.  Positive CHADS2 values include History of HTN.  Negative CHADS2 values include Age > 62 years old.  The start date was 06/13/2008.  Her last INR was 5.6.  Anticoagulation responsible provider: Lillyen Schow.  INR POC: 2.9.  Cuvette Lot#: [id number].  Exp: 09/2010.    Anticoagulation Management Assessment/Plan:      The patient's current anticoagulation dose is Coumadin 7.5 mg tabs: Take asd 1 tablet by mouth once a day.  The target INR is 2 - 3.  The next INR is due 07/07/2009.  Anticoagulation instructions were given to patient.  Results were reviewed/authorized by Cloyde Reams, RN, BSN.  She was notified by Benedict Needy, RN.         Prior Anticoagulation Instructions: INR 2.25  Called spoke with pt.  Advised to continue on same dosage 11.25mg  daily except 7.5mg  on Sundays, Wednesdays, and Fridays.  Awaiting pt call back with name of abx given in ER.  Current Anticoagulation Instructions: INR 2.9  Take 0.5 tablet today. Continue taking 1.5 tablet daily except for 1 tablet Sunday, Wednesday and Friday. Recheck in 2 weeks.

## 2010-02-10 NOTE — Medication Information (Signed)
Summary: Denosumab approved/[employer]  Denosumab approved/[employer]   Imported By: Sherian Rein 11/13/2009 11:05:09  _____________________________________________________________________  External Attachment:    Type:   Image     Comment:   External Document

## 2010-02-10 NOTE — Assessment & Plan Note (Signed)
Summary: 6 MONTH F/U ./CY   Visit Type:  6  mo f/u Primary Provider:  Corwin Levins MD  CC:  sob pt on O2....back pain says not sure if it is her heart or lungs says she had to use her nebulizer 3 x yesterday....states she has had some cp says again not sure if heart or lung....edema/ankles.....  History of Present Illness: [name] [name] returns today for evaluation and management rectal for ablation flutter.  She denies palpitations or symptoms of atrial fib. She wears oxygen 24 7 and has chronic dyspnea.  She remains on propafenone twice a day. She'll remains on Coumadin. She reports no melena or bleeding.  Current Medications (verified): 1)  Furosemide 40 Mg  Tabs (Furosemide) .... Take 1 Tablet By Mouth Once A Day 2)  Coumadin 7.5 Mg Tabs (Warfarin Sodium) .... Take Asd 1 Tablet By Mouth Once A Day 3)  Klor-Con 10 10 Meq Tbcr (Potassium Chloride) .... Take 1 Tablet By Mouth Two Times A Day 4)  Fluoxetine Hcl 40 Mg Caps (Fluoxetine Hcl) .Marland Kitchen.. 1 Once Daily 5)  Omeprazole 20 Mg Cpdr (Omeprazole) .Marland Kitchen.. 1 By Mouth 30 Min Before 1st Meal 6)  Propafenone Hcl 325 Mg Xr12h-Cap (Propafenone Hcl) .Marland Kitchen.. 1 Tab Daily 7)  Nasonex 50 Mcg/act Susp (Mometasone Furoate) .... 2 Puffs Each Nostril Two Times A Day 8)  Symbicort 160-4.5 Mcg/act  Aero (Budesonide-Formoterol Fumarate) .... Two Puffs Twice Daily 9)  Exforge 10-320 Mg  Tabs (Amlodipine Besylate-Valsartan) .Marland Kitchen.. 1tab By Mouth Once Daily 10)  Spiriva Handihaler 18 Mcg  Caps (Tiotropium Bromide Monohydrate) .... Inhale Contents of 1 Capsule Once A Day 11)  Oxygen 2l/min .... Continuous 12)  Glucosamine-Chondroitin   Caps (Glucosamine-Chondroit-Vit C-Mn) .... Take 1 Capsule By Mouth Once A Day 13)  Calcium Carbonate-Vitamin D 600-400 Mg-Unit  Tabs (Calcium Carbonate-Vitamin D) .... Take 1 Tablet By Mouth Two Times A Day 14)  Vitamin D 1000 Unit Tabs (Cholecalciferol) .... Take 1 Tablet By Mouth Once A Day 15)  Xopenex 1.25 Mg/4ml  Nebu (Levalbuterol  Hcl) .... One Every 4 Hours As Needed 16)  Xopenex Hfa 45 Mcg/act  Aero (Levalbuterol Tartrate) .... Inhale 2 Puffs Every 4 Hours As Needed 17)  Tylenol Arthritis Pain 650 Mg Cr-Tabs (Acetaminophen) .... Per Bottle 18)  Mucinex Dm 30-600 Mg Xr12h-Tab (Dextromethorphan-Guaifenesin) .Marland Kitchen.. 1 Every 12 Hours As Needed W/ Flutter 19)  Tramadol Hcl 50 Mg  Tabs (Tramadol Hcl) .... Take 1 Tablet By Mouth Every 4 Hours As  Needed For Cough 20)  Chlor-Trimeton 4 Mg Tabs (Chlorpheniramine Maleate) .Marland Kitchen.. 1 Every 4 Hours As Needed 21)  Cvs Saline Nasal Spray 0.65 %  Soln (Saline) .... 2 Puffs Every 4 Hours As Needed 22)  Afrin Nasal Spray 0.05 % Soln (Oxymetazoline Hcl) .Marland Kitchen.. 1 Puff Two Times A Day X5days - Before Nasonex  Allergies: 1)  ! Pcn 2)  * Cymbalta  Review of Systems       negative other than history of present illness  Vital Signs:  Patient profile:   60 year old female Height:      65 inches Weight:      268 pounds BMI:     44.76 Pulse rate:   84 / minute Pulse rhythm:   irregular BP sitting:   110 / 80  (left arm) Cuff size:   large  Vitals Entered By: Danielle Rankin, CMA (October 30, 2009 3:08 PM)  Physical Exam  General:  morbidly obese.wearing O2, very pleasant Head:  normocephalic and atraumatic Eyes:  PERRLA/EOM intact; conjunctiva and lids normal. Neck:  Neck supple, no JVD. No masses, thyromegaly or abnormal cervical nodes. Lungs:  dbreath sounds throughout Heart:  PMI not appreciated, soft S1-S2, regular rate and rhythm, negative carotid bruits Msk:  decreased ROM.  decreased ROM.   Pulses:  pulses normal in all 4 extremities Extremities:  trace left pedal edema and trace right pedal edema.  trace left pedal edema.   Neurologic:  Alert and oriented x 3. Skin:  Intact without lesions or rashes. Psych:  Normal affect.   EKG  Procedure date:  10/30/2009  Findings:      normal sinus rhythm, normal EKG  Impression & Recommendations:  Problem # 1:  ATRIAL FLUTTER  (ICD-427.32) Assessment Improved  Her updated medication list for this problem includes:    Coumadin 7.5 Mg Tabs (Warfarin sodium) .Marland Kitchen... Take asd 1 tablet by mouth once a day    Propafenone Hcl 325 Mg Xr12h-cap (Propafenone hcl) .Marland Kitchen... 2 tab daily  Problem # 2:  DEPENDENT EDEMA, RIGHT LEG (ICD-782.3) Assessment: Improved  Problem # 3:  PULMONARY EMBOLISM, HX OF (ICD-V12.51) Assessment: Unchanged  Her updated medication list for this problem includes:    Coumadin 7.5 Mg Tabs (Warfarin sodium) .Marland Kitchen... Take asd 1 tablet by mouth once a day  Her updated medication list for this problem includes:    Coumadin 7.5 Mg Tabs (Warfarin sodium) .Marland Kitchen... Take asd 1 tablet by mouth once a day  Problem # 4:  HYPERTENSION (ICD-401.9) Assessment: Improved  Her updated medication list for this problem includes:    Furosemide 40 Mg Tabs (Furosemide) .Marland Kitchen... Take 1 tablet by mouth once a day    Exforge 10-320 Mg Tabs (Amlodipine besylate-valsartan) .Marland Kitchen... 1tab by mouth once daily  Her updated medication list for this problem includes:    Furosemide 40 Mg Tabs (Furosemide) .Marland Kitchen... Take 1 tablet by mouth once a day    Exforge 10-320 Mg Tabs (Amlodipine besylate-valsartan) .Marland Kitchen... 1tab by mouth once daily  Problem # 5:  ANTICOAGULATION THERAPY (ICD-V58.61) Assessment: Unchanged  Problem # 6:  MORBID OBESITY (ICD-278.01) Assessment: Unchanged  Problem # 7:  ATRIAL FIBRILLATION (ICD-427.31) Assessment: Improved  Her updated medication list for this problem includes:    Coumadin 7.5 Mg Tabs (Warfarin sodium) .Marland Kitchen... Take asd 1 tablet by mouth once a day    Propafenone Hcl 325 Mg Xr12h-cap (Propafenone hcl) .Marland Kitchen... 2 tab daily  Her updated medication list for this problem includes:    Coumadin 7.5 Mg Tabs (Warfarin sodium) .Marland Kitchen... Take asd 1 tablet by mouth once a day    Propafenone Hcl 325 Mg Xr12h-cap (Propafenone hcl) .Marland Kitchen... 2 tab daily  Patient Instructions: 1)  Your physician recommends that you schedule a  follow-up appointment in: 6 monmths 2)  Your physician recommends that you continue on your current medications as directed. Please refer to the Current Medication list given to you today.please take propafenone 2x daily

## 2010-02-10 NOTE — Progress Notes (Signed)
Summary: Acute uri/ ? sinusitis > pcn allergic, rx levaquin x 750 x5 days  Phone Note Call from Patient Call back at [facility] Phone [phone]   Caller: Patient Call For: Laritza Vokes Summary of Call: PT WAS IN YESTERDAY SHE IS SICK AND COUGHING UP BLOOD CANT COME IN TO SICK  Initial call taken by: Lacinda Axon,  December 18, 2009 4:46 PM  Follow-up for Phone Call        called and spoke with pt--she stated that she is deathly sick in the bed today with body aches, fever, sore throat, cough with yellow, green and some blood in the plhegm--she just wanted MW to know that she is very sick and is unable to get out of the bed.  please advise. thanks Randell Loop CMA  December 18, 2009 4:55 PM  called pt, sounds very congested and having streaky hemoptysis on coumadin. rec levaquin 750 x 5days and follow action plan on calnedar, hold coumadin until all blood gone, go to er if condition worsens at all  Follow-up by: Nyoka Cowden MD,  December 18, 2009 5:28 PM    New/Updated Medications: LEVAQUIN 750 MG  TABS (LEVOFLOXACIN) One tablet by mouth daily x 5 days Prescriptions: LEVAQUIN 750 MG  TABS (LEVOFLOXACIN) One tablet by mouth daily x 5 days  #5 x 0   Entered and Authorized by:   Nyoka Cowden MD   Signed by:   Nyoka Cowden MD on 12/18/2009   Method used:   Electronically to        [facility]  [address] [phone] (retail)       [address]       [place], [place]  [id number]       Ph: [phone] or [phone]       Fax: [phone]   RxID:   [id number]

## 2010-02-10 NOTE — Assessment & Plan Note (Signed)
Summary: PROLIA/ JWJ/NWS  $25.00 /NWS  Nurse Visit   Allergies: 1)  ! Pcn 2)  * Cymbalta  Medication Administration  Injection # 1:    Medication: Prolia 60mg     Diagnosis: OSTEOPOROSIS (ICD-733.00)    Route: IM    Site: L deltoid    Exp Date: 12/12/2010    Lot #: [id number]    Mfr: [employer]    Patient tolerated injection without complications    Given by: Margaret Pyle, CMA (November 25, 2009 9:55 AM)  Orders Added: 1)  Admin of Therapeutic Inj  intramuscular or subcutaneous [96372] 2)  Prolia 60mg  [J3590]

## 2010-02-10 NOTE — Letter (Signed)
Summary: Custom - Delinquent Coumadin 1  [facility], Main Office  [address]   [place], [place] [address]   Phone: [phone]  Fax: [phone]     August 28, 2008 MRN: [id number]   [name] [address] [place], [place]  [address]   Dear Ms. Lacher,  This letter is being sent to you as a reminder that it is necessary for you to get your INR/PT checked regularly so that we can optimize your care.  Our records indicate that you were scheduled to have a test done recently.  As of today, we have not received the results of this test.  It is very important that you have your INR checked.  Please call our office at the number listed above to schedule an appointment at your earliest convenience.    If you have recently had your protime checked or have discontinued this medication, please contact our office at the above phone number to clarify this issue.  Thank you for this prompt attention to this important health care matter.  Sincerely,   [facility] Team

## 2010-02-10 NOTE — Assessment & Plan Note (Signed)
Summary: EPH/AMD   Visit Type:  POST HOSPITAL  Primary Provider:  Jonny Ruiz  CC:  SOME CP; SOB NO WORSE; SWELLING NOT ANY WORSE.  History of Present Illness: [name] [name] returns today for followup.  She is a pleasant 60 yo woman with a h/o atrial fibrillation and flutter.  She had been controlled nicely on rhythmol until she developed atrial flutter which was incessant and associated with c/p and worsening sob.  She underwent successful EPS/RFA several weeks ago and returns today for followup.  She notes less than one hour of palpitations a week and has had an improvement in her dyspnea as she is walking regularly and trying to lose weight with diet.  Current Medications (verified): 1)  Furosemide 40 Mg  Tabs (Furosemide) .... Take 1 Tablet By Mouth Once A Day and As Needed For Excessive Swelling 2)  Coumadin 7.5 Mg Tabs (Warfarin Sodium) .... Take Asd 1 Tablet By Mouth Once A Day 3)  Klor-Con 10 10 Meq Tbcr (Potassium Chloride) .... Take 2 Tabs Every Am and 1 Tab At Bedtime 4)  Fluoxetine Hcl 40 Mg Caps (Fluoxetine Hcl) .... Take 1 Tablet By Mouth Once A Day 5)  Omeprazole 20 Mg Cpdr (Omeprazole) .Marland Kitchen.. 1 By Mouth 30 Min Before 1st Meal 6)  Rythmol Sr 325 Mg Cp12 (Propafenone Hcl) .... Two Times A Day 7)  Veramyst 27.5 Mcg/spray  Susp (Fluticasone Furoate) .... One Spray Two Times A Day 8)  Symbicort 160-4.5 Mcg/act  Aero (Budesonide-Formoterol Fumarate) .... Two Puffs Twice Daily 9)  Exforge 10-320 Mg  Tabs (Amlodipine Besylate-Valsartan) .Marland Kitchen.. 1tab By Mouth Once Daily 10)  Spiriva Handihaler 18 Mcg  Caps (Tiotropium Bromide Monohydrate) .... Inhale Contents of 1 Capsule Once A Day 11)  Oxygen 2l Liquid .... Continuous 12)  Glucosamine-Chondroitin   Caps (Glucosamine-Chondroit-Vit C-Mn) .... Take 1 Capsule By Mouth Once A Day 13)  Calcium Carbonate-Vitamin D 600-400 Mg-Unit  Tabs (Calcium Carbonate-Vitamin D) .... Take 1 Tablet By Mouth Two Times A Day  14)  Vitamin D 1000 Unit Tabs (Cholecalciferol) .... Take 1 Tablet By Mouth Once A Day 15)  Forteo 600 Mcg/2.62ml Soln (Teriparatide (Recombinant)) .... 20 Micrograms Subcutaneously Once Daily 16)  Xopenex 1.25 Mg/49ml  Nebu (Levalbuterol Hcl) .... One Every 4 Hours As Needed 17)  Xopenex Hfa 45 Mcg/act  Aero (Levalbuterol Tartrate) .... Inhale 2 Puffs Every 4 Hours As Needed 18)  Tylenol Arthritis Pain 650 Mg Cr-Tabs (Acetaminophen) .... Per Bottle 19)  Mucinex Dm 30-600 Mg Xr12h-Tab (Dextromethorphan-Guaifenesin) .Marland Kitchen.. 1 Every 12 Hours As Needed 20)  Tramadol Hcl 50 Mg  Tabs (Tramadol Hcl) .... Take 1-2 Tablet By Mouth Every 4 Hours As  Needed For Cough 21)  Fexofenadine Hcl 180 Mg Tabs (Fexofenadine Hcl) .... Take 1 Tablet By Mouth Once A Day 22)  Cvs Saline Nasal Spray 0.65 %  Soln (Saline) .... 2 Puffs Every 4 Hours As Needed 23)  Afrin Nasal Spray 0.05 % Soln (Oxymetazoline Hcl) .Marland Kitchen.. 1 Puff Two Times A Day X5days 24)  Tessalon Perles 100 Mg Caps (Benzonatate) .Marland Kitchen.. 1 - 2 By Mouth Three Times A Day As Needed  Allergies (verified): 1)  ! Pcn  Past History:  Past Medical History: Last updated: 06/20/2008 CHRONIC RHINITIS (ICD-472.0) EDEMA (ICD-782.3) ACUTE AND CHRONIC RESPIRATORY FAILURE (ICD-518.84) MORBID OBESITY (ICD-278.01)     - Target wt  =   179  for BMI < 30 peak wt 282     - Referred back to nutrition December 18, 2007  DEEP VENOUS THROMBOPHLEBITIS, HX OF (ICD-V12.52) COPD (ICD-496)     - Pft's 07/25/06 FEV1 55% ratio 53, DLC0 51% DEPRESSION (ICD-311) OSTEOPOROSIS (ICD-733.00) last BMD 4/08 -2.4, intolerant of bisphosphonates  HYPERTENSION (ICD-401.9) Atrial fibrillation - paroxysmal Anticoagulation therapy Pulmonary embolism, hx of neg stress myoview 10/07 Anxiety glucose intolerance - on steroids Hyperlipidemia Complex medical regimen    - Med calendar done 05/01/2008  Past Surgical History: Last updated: 04/15/2008 Hysterectomy Tonsillectomy  Skin Graft to midde R finger-1975 s/p left arm fracture with fall off chair  Review of Systems  The patient denies chest pain, syncope, dyspnea on exertion, and peripheral edema.    Vital Signs:  Patient profile:   60 year old female Weight:      255.50 pounds (116.14 kg) BMI:     41.39 Pulse rate:   70 / minute BP sitting:   130 / 78  (right arm)  Vitals Entered By: [name] Cross, RN, BSN (September 02, 2008 11:00 AM)  Physical Exam  General:  alert and overweight-appearing.  , on home o2 Head:  normocephalic and atraumatic.   Eyes:  vision grossly intact, pupils equal, and pupils round.   Mouth:  no gingival abnormalities and pharynx pink and moist.   Neck:  supple and no masses.   Lungs:  R decreased breath sounds and L decreased breath sounds.   Heart:  normal rate and regular rhythm.  No S3 or S4 gallop.  No murmurs appreciated. Abdomen:  soft, non-tender, and normal bowel sounds.  except for mild mid suprapubic tenderness, no guarding or rebound Msk:  Back normal, normal gait. Muscle strength and tone normal. Pulses:  pulses normal in all 4 extremities Extremities:  no edema, no ulcers  Neurologic:  Alert and oriented x 3.   EKG  Procedure date:  09/02/2008  Findings:      Normal sinus rhythm with rate of:  79.  Impression & Recommendations:  Problem # 1:  ATRIAL FLUTTER (ICD-427.32) She is maintaining NSR since her ablation.  Problem # 2:  ATRIAL FIBRILLATION (ICD-427.31) She has minimal symptoms.  Continue meds as noted below. Her updated medication list for this problem includes:    Coumadin 7.5 Mg Tabs (Warfarin sodium) .Marland Kitchen... Take asd 1 tablet by mouth once a day    Rythmol Sr 325 Mg Cp12 (Propafenone hcl) .Marland Kitchen..Marland Kitchen Two times a day  Problem # 3:  ANTICOAGULATION THERAPY (ICD-V58.61) Continue her current dose.  With her h/o atrial fibrillation, I think we should continue coumadin.

## 2010-02-10 NOTE — Progress Notes (Addendum)
Summary: Coumadin  Phone Note Call from Patient Call back at Home Phone [phone]   Caller: Self Call For: [name] [name] Summary of Call: Pt needs to stop Coumadin today for dental surgery next Thursday.  Is this okay? Initial call taken by: Harlon Flor,  December 08, 2009 2:10 PM  Follow-up for Phone Call        [name] [name] calls today for tooth extraction on 12/11/09.  She is aware Dr. Daleen Squibb is out of the office and will await call back.  I will forward to Dr. Daleen Squibb for review. Mylo Red RN     Appended Document: Coumadin ok to stop, restart asap  Appended Document: Coumadin Pt aware.  She will postpone for now.  She will travelling to [place] b/c her brother is very ill. I will send fax to her dentist Dr. Harvel Ricks Tele#  [phone]. Mylo Red RN

## 2010-02-10 NOTE — Progress Notes (Signed)
Summary: -bp meds/ pt called back  Phone Note Call from Patient   Caller: Patient Call For: tammy p/Saadiya Wilfong Reason for Call: Talk to Nurse, Talk to Doctor Summary of Call: changed bp med(tp)  her b-p is elevated  now, what does she need to do> Initial call taken by: Eugene Gavia,  April 19, 2007 9:34 AM  Follow-up for Phone Call        take Diovan 320mg  2 once daily and Norvasc 10mg  1/2 once daily; Thursday-headache-175/110, 138/102, [id number]. Took BP readings this morning at 2:30am 158/96, 146/93. Please advise what pt should do. Thanks   Has appt Wednesday 04-26-07 with Izabelle Daus. Follow-up by: Reynaldo Minium CMA,  April 19, 2007 9:53 AM  Additional Follow-up for Phone Call Additional follow up Details #1::        pt called back and is req instructions re: bp meds before day is over. last bp was 117/81 after 1:00 pm.  ..................................................................Marland KitchenTivis Ringer  April 19, 2007 4:24 PM. i am sending a phone note to triage regarding this now as tp is not here. ..................................................................Marland KitchenTivis Ringer  April 19, 2007 4:37 PM    Additional Follow-up for Phone Call Additional follow up Details #2::    discussed with pt, increase norvasc to one half two times a day until ov

## 2010-02-10 NOTE — Letter (Signed)
Summary: ELectrophysiology/Ablation Procedure Instructions  Architectural technologist at [facility] [address]   [place], [place] [address]   Phone: [phone]  Fax: [phone]     Electrophysiology/Ablation Procedure Instructions    You are scheduled for a(n) _atrial flutter__ on __monday 8/2/10_________ at ___11:30am_________ with Dr. __Taylor____________.  1.  Please come to the Short Stay Center at [facility] at _9:30am________ on the day of your procedure.  2.  Come prepared to stay overnight.   Please bring your insurance cards and a list of your medications.  3.  Come to the [place] office on __fri 7/30/10___ for lab work.  You do not have to be fasting.  4.  Do not have anything to eat or drink after midnight the night before your procedure.  5.  You may take your medications with a small amount of water the morning of your procedure.  6.  Educational material received:  _____ EP   __x__ Ablation   * Occasionally, EP studies and ablations can become lengthy.  Please make your family aware of this before your procedure starts.  Average time ranges from 2-8 hours for EP studies/ablations.  Your physician will locate your family after the procedure with the results.  * If you have any questions after you get home, please call the office at [phone].

## 2010-02-10 NOTE — Assessment & Plan Note (Signed)
Summary: sinus, head congestion/cd   Vital Signs:  Patient profile:   60 year old female Height:      63 inches Weight:      254 pounds BMI:     45.16 O2 Sat:      97 % on Room air Temp:     97.1 degrees F oral Pulse rate:   79 / minute BP sitting:   132 / 70  (left arm) Cuff size:   large  Vitals Entered ByZella Ball Ewing (November 18, 2008 2:21 PM)  O2 Flow:  Room air  Preventive Care Screening  Mammogram:    Date:  01/12/2008    Results:  normal   CC: sinus, head congestion/RE   Primary Care Provider:  Jonny Ruiz  CC:  sinus and head congestion/RE.  History of Present Illness: overall doing well, no complaints except for acute onset facial pain, pressure, fever and greenish d/c and ST and cough on top of more chronic nasal allergic symptoms also worse in the past several wks;  trying to follow low chol diet;  wt overall stable, Pt denies CP, sob, doe, wheezing, orthopnea, pnd, worsening LE edema, palps, dizziness or syncope   Pt denies new neuro symptoms such as headache, facial or extremity weakness     Problems Prior to Update: 1)  Premature Ventricular Contractions  (ICD-427.69) 2)  Sinusitis- Acute-nos  (ICD-461.9) 3)  Uti  (ICD-599.0) 4)  Dysuria  (ICD-788.1) 5)  Atrial Flutter  (ICD-427.32) 6)  Cough  (ICD-786.2) 7)  Ankle Pain  (ICD-719.47) 8)  Dependent Edema, Right Leg  (ICD-782.3) 9)  Pneumonia Organism Nos  (ICD-486) 10)  Preventive Health Care  (ICD-V70.0) 11)  Conjunctivitis  (ICD-372.30) 12)  Vitamin D Deficiency  (ICD-268.9) 13)  Preventive Health Care  (ICD-V70.0) 14)  Hyperlipidemia  (ICD-272.4) 15)  Glucose Intolerance  (ICD-271.3) 16)  Anxiety  (ICD-300.00) 17)  Pulmonary Embolism, Hx of  (ICD-V12.51) 18)  Anticoagulation Therapy  (ICD-V58.61) 19)  Atrial Fibrillation  (ICD-427.31) 20)  Chronic Rhinitis  (ICD-472.0) 21)  Edema  (ICD-782.3) 22)  Obstructive Chronic Bronchitis With Exacerbation  (ICD-491.21)  23)  Acute and Chronic Respiratory Failure  (ICD-518.84) 24)  Morbid Obesity  (ICD-278.01) 25)  Deep Venous Thrombophlebitis, Hx of  (ICD-V12.52) 26)  COPD  (ICD-496) 27)  Depression  (ICD-311) 28)  Osteoporosis  (ICD-733.00) 29)  Hypertension  (ICD-401.9)  Medications Prior to Update: 1)  Furosemide 40 Mg  Tabs (Furosemide) .... Take 1 Tablet By Mouth Once A Day and As Needed For Excessive Swelling 2)  Coumadin 7.5 Mg Tabs (Warfarin Sodium) .... Take Asd 1 Tablet By Mouth Once A Day 3)  Klor-Con 10 10 Meq Tbcr (Potassium Chloride) .... Take 2 Tabs Every Am and 1 Tab At Bedtime 4)  Fluoxetine Hcl 40 Mg Caps (Fluoxetine Hcl) .... Take 1 Tablet By Mouth Once A Day 5)  Omeprazole 20 Mg Cpdr (Omeprazole) .Marland Kitchen.. 1 By Mouth 30 Min Before 1st Meal 6)  Rythmol Sr 325 Mg Cp12 (Propafenone Hcl) .... Two Times A Day 7)  Veramyst 27.5 Mcg/spray  Susp (Fluticasone Furoate) .... One Spray Two Times A Day 8)  Symbicort 160-4.5 Mcg/act  Aero (Budesonide-Formoterol Fumarate) .... Two Puffs Twice Daily 9)  Exforge 10-320 Mg  Tabs (Amlodipine Besylate-Valsartan) .Marland Kitchen.. 1tab By Mouth Once Daily 10)  Spiriva Handihaler 18 Mcg  Caps (Tiotropium Bromide Monohydrate) .... Inhale Contents of 1 Capsule Once A Day 11)  Oxygen 2l Liquid .... Continuous 12)  Glucosamine-Chondroitin   Caps (  Glucosamine-Chondroit-Vit C-Mn) .... Take 1 Capsule By Mouth Once A Day 13)  Calcium Carbonate-Vitamin D 600-400 Mg-Unit  Tabs (Calcium Carbonate-Vitamin D) .... Take 1 Tablet By Mouth Two Times A Day 14)  Vitamin D 1000 Unit Tabs (Cholecalciferol) .... Take 1 Tablet By Mouth Once A Day 15)  Forteo 600 Mcg/2.51ml Soln (Teriparatide (Recombinant)) .... 20 Micrograms Subcutaneously Once Daily 16)  Xopenex 1.25 Mg/62ml  Nebu (Levalbuterol Hcl) .... One Every 4 Hours As Needed 17)  Xopenex Hfa 45 Mcg/act  Aero (Levalbuterol Tartrate) .... Inhale 2 Puffs Every 4 Hours As Needed  18)  Tylenol Arthritis Pain 650 Mg Cr-Tabs (Acetaminophen) .... Per Bottle 19)  Mucinex Dm 30-600 Mg Xr12h-Tab (Dextromethorphan-Guaifenesin) .Marland Kitchen.. 1 Every 12 Hours As Needed 20)  Tramadol Hcl 50 Mg  Tabs (Tramadol Hcl) .... Take 1-2 Tablet By Mouth Every 4 Hours As  Needed For Cough 21)  Fexofenadine Hcl 180 Mg Tabs (Fexofenadine Hcl) .... Take 1 Tablet By Mouth Once A Day 22)  Cvs Saline Nasal Spray 0.65 %  Soln (Saline) .... 2 Puffs Every 4 Hours As Needed 23)  Afrin Nasal Spray 0.05 % Soln (Oxymetazoline Hcl) .Marland Kitchen.. 1 Puff Two Times A Day X5days 24)  Tessalon Perles 100 Mg Caps (Benzonatate) .Marland Kitchen.. 1 - 2 By Mouth Three Times A Day As Needed  Current Medications (verified): 1)  Furosemide 40 Mg  Tabs (Furosemide) .... Take 1 Tablet By Mouth Once A Day and As Needed For Excessive Swelling 2)  Coumadin 7.5 Mg Tabs (Warfarin Sodium) .... Take Asd 1 Tablet By Mouth Once A Day 3)  Klor-Con 10 10 Meq Tbcr (Potassium Chloride) .... Take 2 Tabs Every Am and 1 Tab At Bedtime 4)  Fluoxetine Hcl 40 Mg Caps (Fluoxetine Hcl) .... Take 1 Tablet By Mouth Once A Day 5)  Omeprazole 20 Mg Cpdr (Omeprazole) .Marland Kitchen.. 1 By Mouth 30 Min Before 1st Meal 6)  Rythmol Sr 325 Mg Cp12 (Propafenone Hcl) .... Two Times A Day 7)  Veramyst 27.5 Mcg/spray  Susp (Fluticasone Furoate) .... One Spray Two Times A Day 8)  Symbicort 160-4.5 Mcg/act  Aero (Budesonide-Formoterol Fumarate) .... Two Puffs Twice Daily 9)  Exforge 10-320 Mg  Tabs (Amlodipine Besylate-Valsartan) .Marland Kitchen.. 1tab By Mouth Once Daily 10)  Spiriva Handihaler 18 Mcg  Caps (Tiotropium Bromide Monohydrate) .... Inhale Contents of 1 Capsule Once A Day 11)  Oxygen 2l Liquid .... Continuous 12)  Glucosamine-Chondroitin   Caps (Glucosamine-Chondroit-Vit C-Mn) .... Take 1 Capsule By Mouth Once A Day 13)  Calcium Carbonate-Vitamin D 600-400 Mg-Unit  Tabs (Calcium Carbonate-Vitamin D) .... Take 1 Tablet By Mouth Two Times A Day  14)  Vitamin D 1000 Unit Tabs (Cholecalciferol) .... Take 1 Tablet By Mouth Once A Day 15)  Forteo 600 Mcg/2.75ml Soln (Teriparatide (Recombinant)) .... 20 Micrograms Subcutaneously Once Daily 16)  Xopenex 1.25 Mg/61ml  Nebu (Levalbuterol Hcl) .... One Every 4 Hours As Needed 17)  Xopenex Hfa 45 Mcg/act  Aero (Levalbuterol Tartrate) .... Inhale 2 Puffs Every 4 Hours As Needed 18)  Tylenol Arthritis Pain 650 Mg Cr-Tabs (Acetaminophen) .... Per Bottle 19)  Mucinex Dm 30-600 Mg Xr12h-Tab (Dextromethorphan-Guaifenesin) .Marland Kitchen.. 1 Every 12 Hours As Needed 20)  Tramadol Hcl 50 Mg  Tabs (Tramadol Hcl) .... Take 1-2 Tablet By Mouth Every 4 Hours As  Needed For Cough 21)  Fexofenadine Hcl 180 Mg Tabs (Fexofenadine Hcl) .... Take 1 Tablet By Mouth Once A Day 22)  Cvs Saline Nasal Spray 0.65 %  Soln (Saline) .... 2 Puffs  Every 4 Hours As Needed 23)  Afrin Nasal Spray 0.05 % Soln (Oxymetazoline Hcl) .Marland Kitchen.. 1 Puff Two Times A Day X5days 24)  Tessalon Perles 100 Mg Caps (Benzonatate) .Marland Kitchen.. 1 - 2 By Mouth Three Times A Day As Needed 25)  Azithromycin 250 Mg Tabs (Azithromycin) .... 2po Qd For 1 Day, Then 1po Qd For 4days, Then Stop  Allergies (verified): 1)  ! Pcn  Past History:  Past Medical History: Last updated: 06/20/2008 CHRONIC RHINITIS (ICD-472.0) EDEMA (ICD-782.3) ACUTE AND CHRONIC RESPIRATORY FAILURE (ICD-518.84) MORBID OBESITY (ICD-278.01)     - Target wt  =   179  for BMI < 30 peak wt 282     - Referred back to nutrition December 18, 2007  DEEP VENOUS THROMBOPHLEBITIS, HX OF (ICD-V12.52) COPD (ICD-496)     - Pft's 07/25/06 FEV1 55% ratio 53, DLC0 51% DEPRESSION (ICD-311) OSTEOPOROSIS (ICD-733.00) last BMD 4/08 -2.4, intolerant of bisphosphonates  HYPERTENSION (ICD-401.9) Atrial fibrillation - paroxysmal Anticoagulation therapy Pulmonary embolism, hx of neg stress myoview 10/07 Anxiety glucose intolerance - on steroids Hyperlipidemia Complex medical regimen    - Med calendar done 05/01/2008   Past Surgical History: Last updated: 04/15/2008 Hysterectomy Tonsillectomy Skin Graft to midde R finger-1975 s/p left arm fracture with fall off chair  Family History: Last updated: 07/07/2007 asthma and brothers and son.   Heart disease in mother.   Throat cancer in brother.  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         all otherwise negative per pt   Physical Exam  General:  alert and overweight-appearing., mild ill alert.   Head:  normocephalic and atraumatic.  normocephalic and atraumatic.   Eyes:  vision grossly intact, pupils equal, and pupils round.  vision grossly intact, pupils equal, and pupils round.   Ears:  bilat tm's mild red, sinus tender bilat Nose:  no external deformity and no nasal discharge.  no external deformity and no nasal discharge.   Mouth:  no gingival abnormalities and pharynx pink and moist.  no gingival abnormalities and pharynx pink and moist.   Neck:  supple and no masses.  supple and no masses.   Lungs:  normal respiratory effort and normal breath sounds.  normal respiratory effort and normal breath sounds.   Heart:  normal rate and regular rhythm.  normal rate and regular rhythm.   Abdomen:  soft, non-tender, and normal bowel sounds.  soft, non-tender, and normal bowel sounds.    Msk:  no joint tenderness and no joint swelling.  no joint tenderness and no joint swelling.   Extremities:  no edema, no erythema  Neurologic:  cranial nerves II-XII intact and strength normal in all extremities.  cranial nerves II-XII intact and strength normal in all extremities.     Impression & Recommendations:  Problem # 1:  Preventive Health Care (ICD-V70.0) Overall doing well, up to date, counseled on routine health concerns for screening and prevention, immunizations up to date or declined, labs declines as she has had mult labs this year with several hospn's  Problem # 2:  SINUSITIS- ACUTE-NOS (ICD-461.9)  Her updated medication list for this problem includes:    Veramyst 27.5 Mcg/spray Susp (Fluticasone furoate) ..... One spray two times a day    Mucinex Dm 30-600 Mg Xr12h-tab (Dextromethorphan-guaifenesin) .Marland Kitchen... 1 every 12 hours as needed    Cvs Saline Nasal Spray 0.65 % Soln (Saline) .Marland Kitchen... 2 puffs every 4 hours as needed    Afrin Nasal Spray 0.05 % Soln (Oxymetazoline hcl) .Marland Kitchen... 1 puff two times a day x5days    Tessalon Perles 100 Mg Caps (Benzonatate) .Marland Kitchen... 1 - 2 by mouth three times a day as needed    Azithromycin 250 Mg Tabs (Azithromycin) .Marland Kitchen... 2po qd for 1 day, then 1po qd for 4days, then stop treat as above, f/u any worsening signs or symptoms   Her updated medication list for this problem includes:    Veramyst 27.5 Mcg/spray Susp (Fluticasone furoate) ..... One spray two times a day    Mucinex Dm 30-600 Mg Xr12h-tab (Dextromethorphan-guaifenesin) .Marland Kitchen... 1 every 12 hours as needed    Cvs Saline Nasal Spray 0.65 % Soln (Saline) .Marland Kitchen... 2 puffs every 4 hours as needed    Afrin Nasal Spray 0.05 % Soln (Oxymetazoline hcl) .Marland Kitchen... 1 puff two times a day x5days    Tessalon Perles 100 Mg Caps (Benzonatate) .Marland Kitchen... 1 - 2 by mouth three times a day as needed    Azithromycin 250 Mg Tabs (Azithromycin) .Marland Kitchen... 2po qd for 1 day, then 1po qd for 4days, then stop   Problem # 3:  COPD (ICD-496)  Her updated medication list for this problem includes:    Symbicort 160-4.5 Mcg/act Aero (Budesonide-formoterol fumarate) .Marland Kitchen..Marland Kitchen Two puffs twice daily    Spiriva Handihaler 18 Mcg Caps (Tiotropium bromide monohydrate) ..... Inhale contents of 1 capsule once a day    Xopenex 1.25 Mg/20ml Nebu (Levalbuterol hcl) ..... One every 4 hours as needed    Xopenex Hfa 45 Mcg/act Aero (Levalbuterol tartrate) ..... Inhale 2 puffs every 4 hours as needed stable overall by hx and exam, ok to continue meds/tx as is   Problem # 4:  HYPERLIPIDEMIA (ICD-272.4)  Labs Reviewed: SGOT: 20 (10/14/2008)   SGPT: 18 (10/14/2008)   HDL:35.60 (10/14/2008), 39.8 (12/05/2007)  LDL:117 (10/14/2008), 120 (12/05/2007)  Chol:179 (10/14/2008), 178 (12/05/2007)  Trig:132.0 (10/14/2008), 90 (12/05/2007) d/w pt - goal ldl less than 100; declines statin as "I'm on too many medications already"  Problem # 5:  CHRONIC RHINITIS (ICD-472.0)  Orders: Depo- Medrol 40mg  (J1030) Depo- Medrol 80mg  (J1040) Admin of Therapeutic Inj  intramuscular or subcutaneous (81191) urged to re-try the veramyst; also for depo shot today  Complete Medication List: 1)  Furosemide 40 Mg Tabs (Furosemide) .... Take 1 tablet by mouth once a day and as needed for excessive swelling 2)  Coumadin 7.5 Mg Tabs (Warfarin sodium) .... Take asd 1 tablet by mouth once a day 3)  Klor-con 10 10 Meq Tbcr (Potassium chloride) .... Take 2 tabs every am and 1 tab at bedtime 4)  Fluoxetine Hcl 40 Mg Caps (Fluoxetine hcl) .... Take 1 tablet by mouth once a day 5)  Omeprazole 20 Mg Cpdr (Omeprazole) .Marland Kitchen.. 1 by mouth 30 min before 1st meal 6)  Rythmol Sr 325 Mg Cp12 (Propafenone hcl) .... Two times a day 7)  Veramyst 27.5 Mcg/spray Susp (Fluticasone furoate) .... One spray two times a day 8)  Symbicort 160-4.5 Mcg/act Aero (Budesonide-formoterol fumarate) .... Two puffs twice daily  9)  Exforge 10-320 Mg Tabs (Amlodipine besylate-valsartan) .Marland KitchenMarland KitchenMarland Kitchen  1tab by mouth once daily 10)  Spiriva Handihaler 18 Mcg Caps (Tiotropium bromide monohydrate) .... Inhale contents of 1 capsule once a day 11)  Oxygen 2l Liquid  .... Continuous 12)  Glucosamine-chondroitin Caps (Glucosamine-chondroit-vit c-mn) .... Take 1 capsule by mouth once a day 13)  Calcium Carbonate-vitamin D 600-400 Mg-unit Tabs (Calcium carbonate-vitamin d) .... Take 1 tablet by mouth two times a day 14)  Vitamin D 1000 Unit Tabs (Cholecalciferol) .... Take 1 tablet by mouth once a day 15)  Forteo 600 Mcg/2.61ml Soln (Teriparatide (recombinant)) .... 20 micrograms subcutaneously once daily 16)  Xopenex 1.25 Mg/7ml Nebu (Levalbuterol hcl) .... One every 4 hours as needed 17)  Xopenex Hfa 45 Mcg/act Aero (Levalbuterol tartrate) .... Inhale 2 puffs every 4 hours as needed 18)  Tylenol Arthritis Pain 650 Mg Cr-tabs (Acetaminophen) .... Per bottle 19)  Mucinex Dm 30-600 Mg Xr12h-tab (Dextromethorphan-guaifenesin) .Marland Kitchen.. 1 every 12 hours as needed 20)  Tramadol Hcl 50 Mg Tabs (Tramadol hcl) .... Take 1-2 tablet by mouth every 4 hours as  needed for cough 21)  Fexofenadine Hcl 180 Mg Tabs (Fexofenadine hcl) .... Take 1 tablet by mouth once a day 22)  Cvs Saline Nasal Spray 0.65 % Soln (Saline) .... 2 puffs every 4 hours as needed 23)  Afrin Nasal Spray 0.05 % Soln (Oxymetazoline hcl) .Marland Kitchen.. 1 puff two times a day x5days 24)  Tessalon Perles 100 Mg Caps (Benzonatate) .Marland Kitchen.. 1 - 2 by mouth three times a day as needed 25)  Azithromycin 250 Mg Tabs (Azithromycin) .... 2po qd for 1 day, then 1po qd for 4days, then stop  Patient Instructions: 1)  Please take all new medications as prescribed - the antibiotic and the tessalon for the cough 2)  You had the steroid shot today 3)  Continue all previous medications as before this visit  4)  Please schedule a follow-up appointment in 1 year or sooner if needed Prescriptions:  AZITHROMYCIN 250 MG TABS (AZITHROMYCIN) 2po qd for 1 day, then 1po qd for 4days, then stop  #6 x 1   Entered and Authorized by:   Corwin Levins MD   Signed by:   Corwin Levins MD on 11/18/2008   Method used:   Electronically to        [facility]  [address] [phone]* (retail)       [address]       [place], [place]  [id number]       Ph: [phone] or [phone]       Fax: [phone]   RxID:   [id number] TESSALON PERLES 100 MG CAPS (BENZONATATE) 1 - 2 by mouth three times a day as needed  #60 x 1   Entered and Authorized by:   Corwin Levins MD   Signed by:   Corwin Levins MD on 11/18/2008   Method used:   Electronically to        [facility]  [address] [phone]* (retail)       [address]       [place], [place]  [id number]       Ph: [phone] or [phone]       Fax: [phone]   RxID:   [id number]    Medication Administration  Injection # 1:    Medication: Depo- Medrol 40mg     Diagnosis: CHRONIC RHINITIS (ICD-472.0)    Route: IM  Site: RUOQ gluteus    Exp Date: 10/2009    Lot #: [id number]    Mfr: Teva    Given by: Zella Ball Ewing (November 18, 2008 3:03 PM)  Injection # 2:    Medication: Depo- Medrol 80mg     Diagnosis: CHRONIC RHINITIS (ICD-472.0)    Route: IM    Site: RUOQ gluteus    Exp Date: 10/2009    Lot #: [id number]    Mfr: Teva    Given by: Zella Ball Ewing (November 18, 2008 3:03 PM)  Orders Added: 1)  Depo- Medrol 40mg  [J1030] 2)  Depo- Medrol 80mg  [J1040] 3)  Est. Patient 40-64 years [99396] 4)  Admin of Therapeutic Inj  intramuscular or subcutaneous [14782]

## 2010-02-10 NOTE — Assessment & Plan Note (Signed)
Summary: cough,cold/#/cd   Vital Signs:  Patient profile:   60 year old female Height:      65 inches Weight:      257 pounds BMI:     42.92 O2 Sat:      96 % on Room air Temp:     98.3 degrees F oral Pulse rate:   88 / minute BP sitting:   110 / 62  (left arm) Cuff size:   large  Vitals Entered ByMarland Kitchen Zella Ball Ewing (January 16, 2009 11:32 AM)  O2 Flow:  Room air CC: cough,congestion,head and ear pain/RE   Primary Care Provider:  Jonny Ruiz  CC:  cough, congestion, and head and ear pain/RE.  History of Present Illness: here with 3 days onset facial pain, pressure, fever and greenish d/c; with today onset midl to mod wheezing and sob/doe;  Pt denies other CP,  orthopnea, pnd, worsening LE edema, palps, dizziness or syncope .  Pt denies new neuro symptoms such as headache, facial or extremity weakness  Overall good compliacne with meds.    Problems Prior to Update: 1)  Sinusitis- Acute-nos  (ICD-461.9) 2)  Preventive Health Care  (ICD-V70.0) 3)  Premature Ventricular Contractions  (ICD-427.69) 4)  Sinusitis- Acute-nos  (ICD-461.9) 5)  Uti  (ICD-599.0) 6)  Dysuria  (ICD-788.1) 7)  Atrial Flutter  (ICD-427.32) 8)  Cough  (ICD-786.2) 9)  Ankle Pain  (ICD-719.47) 10)  Dependent Edema, Right Leg  (ICD-782.3) 11)  Pneumonia Organism Nos  (ICD-486) 12)  Preventive Health Care  (ICD-V70.0) 13)  Conjunctivitis  (ICD-372.30) 14)  Vitamin D Deficiency  (ICD-268.9) 15)  Preventive Health Care  (ICD-V70.0) 16)  Hyperlipidemia  (ICD-272.4) 17)  Glucose Intolerance  (ICD-271.3) 18)  Anxiety  (ICD-300.00) 19)  Pulmonary Embolism, Hx of  (ICD-V12.51) 20)  Anticoagulation Therapy  (ICD-V58.61) 21)  Atrial Fibrillation  (ICD-427.31) 22)  Chronic Rhinitis  (ICD-472.0) 23)  Edema  (ICD-782.3) 24)  Obstructive Chronic Bronchitis With Exacerbation  (ICD-491.21) 25)  Acute and Chronic Respiratory Failure  (ICD-518.84) 26)  Morbid Obesity  (ICD-278.01) 27)  Deep Venous Thrombophlebitis, Hx of   (ICD-V12.52) 28)  COPD  (ICD-496) 29)  Depression  (ICD-311) 30)  Osteoporosis  (ICD-733.00) 31)  Hypertension  (ICD-401.9)  Medications Prior to Update: 1)  Furosemide 40 Mg  Tabs (Furosemide) .... Take 1 Tablet By Mouth Once A Day and As Needed For Excessive Swelling 2)  Coumadin 7.5 Mg Tabs (Warfarin Sodium) .... Take Asd 1 Tablet By Mouth Once A Day 3)  Klor-Con 10 10 Meq Tbcr (Potassium Chloride) .... Take 2 Tabs Every Am and 1 Tab At Bedtime 4)  Fluoxetine Hcl 40 Mg Caps (Fluoxetine Hcl) .... Take 1 Tablet By Mouth Once A Day 5)  Omeprazole 20 Mg Cpdr (Omeprazole) .Marland Kitchen.. 1 By Mouth 30 Min Before 1st Meal 6)  Rythmol Sr 325 Mg Cp12 (Propafenone Hcl) .... Two Times A Day 7)  Veramyst 27.5 Mcg/spray  Susp (Fluticasone Furoate) .... One Spray Two Times A Day 8)  Symbicort 160-4.5 Mcg/act  Aero (Budesonide-Formoterol Fumarate) .... Two Puffs Twice Daily 9)  Exforge 10-320 Mg  Tabs (Amlodipine Besylate-Valsartan) .Marland Kitchen.. 1tab By Mouth Once Daily 10)  Spiriva Handihaler 18 Mcg  Caps (Tiotropium Bromide Monohydrate) .... Inhale Contents of 1 Capsule Once A Day 11)  Oxygen 2l Liquid .... Continuous 12)  Glucosamine-Chondroitin   Caps (Glucosamine-Chondroit-Vit C-Mn) .... Take 1 Capsule By Mouth Once A Day 13)  Calcium Carbonate-Vitamin D 600-400 Mg-Unit  Tabs (Calcium Carbonate-Vitamin D) .... Take  1 Tablet By Mouth Two Times A Day 14)  Vitamin D 1000 Unit Tabs (Cholecalciferol) .... Take 1 Tablet By Mouth Once A Day 15)  Xopenex 1.25 Mg/45ml  Nebu (Levalbuterol Hcl) .... One Every 4 Hours As Needed 16)  Xopenex Hfa 45 Mcg/act  Aero (Levalbuterol Tartrate) .... Inhale 2 Puffs Every 4 Hours As Needed 17)  Tylenol Arthritis Pain 650 Mg Cr-Tabs (Acetaminophen) .... Per Bottle 18)  Mucinex Dm 30-600 Mg Xr12h-Tab (Dextromethorphan-Guaifenesin) .Marland Kitchen.. 1 Every 12 Hours As Needed 19)  Tramadol Hcl 50 Mg  Tabs (Tramadol Hcl) .... Take 1-2 Tablet By Mouth Every 4 Hours As  Needed For Cough 20)  Fexofenadine Hcl  180 Mg Tabs (Fexofenadine Hcl) .... Take 1 Tablet By Mouth Once A Day 21)  Cvs Saline Nasal Spray 0.65 %  Soln (Saline) .... 2 Puffs Every 4 Hours As Needed 22)  Afrin Nasal Spray 0.05 % Soln (Oxymetazoline Hcl) .Marland Kitchen.. 1 Puff Two Times A Day X5days 23)  Tessalon Perles 100 Mg Caps (Benzonatate) .Marland Kitchen.. 1 - 2 By Mouth Three Times A Day As Needed 24)  Azithromycin 250 Mg Tabs (Azithromycin) .... 2po Qd For 1 Day, Then 1po Qd For 4days, Then Stop  Current Medications (verified): 1)  Furosemide 40 Mg  Tabs (Furosemide) .... Take 1 Tablet By Mouth Once A Day and As Needed For Excessive Swelling 2)  Coumadin 7.5 Mg Tabs (Warfarin Sodium) .... Take Asd 1 Tablet By Mouth Once A Day 3)  Klor-Con 10 10 Meq Tbcr (Potassium Chloride) .... Take 2 Tabs Every Am and 1 Tab At Bedtime 4)  Fluoxetine Hcl 40 Mg Caps (Fluoxetine Hcl) .... Take 1 Tablet By Mouth Once A Day 5)  Omeprazole 20 Mg Cpdr (Omeprazole) .Marland Kitchen.. 1 By Mouth 30 Min Before 1st Meal 6)  Rythmol Sr 325 Mg Cp12 (Propafenone Hcl) .... Two Times A Day 7)  Veramyst 27.5 Mcg/spray  Susp (Fluticasone Furoate) .... One Spray Two Times A Day 8)  Symbicort 160-4.5 Mcg/act  Aero (Budesonide-Formoterol Fumarate) .... Two Puffs Twice Daily 9)  Exforge 10-320 Mg  Tabs (Amlodipine Besylate-Valsartan) .Marland Kitchen.. 1tab By Mouth Once Daily 10)  Spiriva Handihaler 18 Mcg  Caps (Tiotropium Bromide Monohydrate) .... Inhale Contents of 1 Capsule Once A Day 11)  Oxygen 2l Liquid .... Continuous 12)  Glucosamine-Chondroitin   Caps (Glucosamine-Chondroit-Vit C-Mn) .... Take 1 Capsule By Mouth Once A Day 13)  Calcium Carbonate-Vitamin D 600-400 Mg-Unit  Tabs (Calcium Carbonate-Vitamin D) .... Take 1 Tablet By Mouth Two Times A Day 14)  Vitamin D 1000 Unit Tabs (Cholecalciferol) .... Take 1 Tablet By Mouth Once A Day 15)  Xopenex 1.25 Mg/45ml  Nebu (Levalbuterol Hcl) .... One Every 4 Hours As Needed 16)  Xopenex Hfa 45 Mcg/act  Aero (Levalbuterol Tartrate) .... Inhale 2 Puffs Every 4  Hours As Needed 17)  Tylenol Arthritis Pain 650 Mg Cr-Tabs (Acetaminophen) .... Per Bottle 18)  Mucinex Dm 30-600 Mg Xr12h-Tab (Dextromethorphan-Guaifenesin) .Marland Kitchen.. 1 Every 12 Hours As Needed 19)  Tramadol Hcl 50 Mg  Tabs (Tramadol Hcl) .... Take 1-2 Tablet By Mouth Every 4 Hours As  Needed For Cough 20)  Fexofenadine Hcl 180 Mg Tabs (Fexofenadine Hcl) .... Take 1 Tablet By Mouth Once A Day 21)  Cvs Saline Nasal Spray 0.65 %  Soln (Saline) .... 2 Puffs Every 4 Hours As Needed 22)  Afrin Nasal Spray 0.05 % Soln (Oxymetazoline Hcl) .Marland Kitchen.. 1 Puff Two Times A Day X5days 23)  Hydrocodone-Homatropine 5-1.5 Mg/33ml Syrp (Hydrocodone-Homatropine) .Marland Kitchen.. 1 Tsp  By Mouth Q 6 Hrs As Needed Cough 24)  Avelox 400 Mg Tabs (Moxifloxacin Hcl) .Marland Kitchen.. 1 By Mouth Once Daily 25)  Prednisone 10 Mg Tabs (Prednisone) .... 6po Qd For 3days, Then 4po Qd For 3days, Then 3po Qd For 3days, Then 2po Qd For 3days, Then 1po Qd For 3 Days, Then Stop  Allergies (verified): 1)  ! Pcn  Past History:  Past Medical History: Last updated: 06/20/2008 CHRONIC RHINITIS (ICD-472.0) EDEMA (ICD-782.3) ACUTE AND CHRONIC RESPIRATORY FAILURE (ICD-518.84) MORBID OBESITY (ICD-278.01)     - Target wt  =   179  for BMI < 30 peak wt 282     - Referred back to nutrition December 18, 2007  DEEP VENOUS THROMBOPHLEBITIS, HX OF (ICD-V12.52) COPD (ICD-496)     - Pft's 07/25/06 FEV1 55% ratio 53, DLC0 51% DEPRESSION (ICD-311) OSTEOPOROSIS (ICD-733.00) last BMD 4/08 -2.4, intolerant of bisphosphonates  HYPERTENSION (ICD-401.9) Atrial fibrillation - paroxysmal Anticoagulation therapy Pulmonary embolism, hx of neg stress myoview 10/07 Anxiety glucose intolerance - on steroids Hyperlipidemia Complex medical regimen    - Med calendar done 05/01/2008  Past Surgical History: Last updated: 04/15/2008 Hysterectomy Tonsillectomy Skin Graft to midde R finger-1975 s/p left arm fracture with fall off chair  Social History: Last updated:  06/19/2007 Patient states former smoker quit smoking in 2003 Married 3 children Alcohol use-no 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  , mild ill  Head:  normocephalic and atraumatic.   Eyes:  vision grossly intact, pupils equal, and pupils round.   Ears:  bilat tm's red, sinus tender bilat Nose:  nasal dischargemucosal pallor and mucosal erythema.   Mouth:  pharyngeal erythema and fair dentition.   Neck:  supple and cervical lymphadenopathy.   Lungs:  normal respiratory effort, R decreased breath sounds, R wheezes, L decreased breath sounds, and L wheezes.   Heart:  normal rate and regular rhythm.   Extremities:  no edema, no erythema    Impression & Recommendations:  Problem # 1:  SINUSITIS- ACUTE-NOS (ICD-461.9)  Her updated medication list for this problem includes:    Veramyst 27.5 Mcg/spray Susp (Fluticasone furoate) ..... One spray two times a day    Mucinex Dm 30-600 Mg Xr12h-tab (Dextromethorphan-guaifenesin) .Marland Kitchen... 1 every 12 hours as needed    Cvs Saline Nasal Spray 0.65 % Soln (Saline) .Marland Kitchen... 2 puffs every 4 hours as needed    Afrin Nasal Spray 0.05 % Soln (Oxymetazoline hcl) .Marland Kitchen... 1 puff two times a day x5days    Hydrocodone-homatropine 5-1.5 Mg/55ml Syrp (Hydrocodone-homatropine) .Marland Kitchen... 1 tsp by mouth q 6 hrs as needed cough    Avelox 400 Mg Tabs (Moxifloxacin hcl) .Marland Kitchen... 1 by mouth once daily treat as above, f/u any worsening signs or symptoms   Problem # 2:  OBSTRUCTIVE CHRONIC BRONCHITIS WITH EXACERBATION (ICD-491.21)  for depo shot, and prednisone burst and taper off, also check cxr  Orders: Depo- Medrol 40mg  (J1030) Depo- Medrol 80mg  (J1040) Admin of Therapeutic Inj  intramuscular or subcutaneous (78469) T-2 View CXR, Same Day (71020.5TC)  Problem # 3:  HYPERTENSION (ICD-401.9)  Her  updated medication list for this problem includes:    Furosemide 40 Mg Tabs (Furosemide) .Marland Kitchen... Take 1 tablet by mouth once a day and as needed for excessive swelling    Exforge 10-320 Mg Tabs (Amlodipine besylate-valsartan) .Marland KitchenMarland KitchenMarland KitchenMarland Kitchen  1tab by mouth once daily  BP today: 110/62 Prior BP: 132/70 (11/18/2008)  Labs Reviewed: K+: 5.0 (10/23/2008) Creat: : 0.87 (10/23/2008)   Chol: 179 (10/14/2008)   HDL: 35.60 (10/14/2008)   LDL: 117 (10/14/2008)   TG: 132.0 (10/14/2008) stable overall by hx and exam, ok to continue meds/tx as is   Complete Medication List: 1)  Furosemide 40 Mg Tabs (Furosemide) .... Take 1 tablet by mouth once a day and as needed for excessive swelling 2)  Coumadin 7.5 Mg Tabs (Warfarin sodium) .... Take asd 1 tablet by mouth once a day 3)  Klor-con 10 10 Meq Tbcr (Potassium chloride) .... Take 2 tabs every am and 1 tab at bedtime 4)  Fluoxetine Hcl 40 Mg Caps (Fluoxetine hcl) .... Take 1 tablet by mouth once a day 5)  Omeprazole 20 Mg Cpdr (Omeprazole) .Marland Kitchen.. 1 by mouth 30 min before 1st meal 6)  Rythmol Sr 325 Mg Cp12 (Propafenone hcl) .... Two times a day 7)  Veramyst 27.5 Mcg/spray Susp (Fluticasone furoate) .... One spray two times a day 8)  Symbicort 160-4.5 Mcg/act Aero (Budesonide-formoterol fumarate) .... Two puffs twice daily 9)  Exforge 10-320 Mg Tabs (Amlodipine besylate-valsartan) .Marland Kitchen.. 1tab by mouth once daily 10)  Spiriva Handihaler 18 Mcg Caps (Tiotropium bromide monohydrate) .... Inhale contents of 1 capsule once a day 11)  Oxygen 2l Liquid  .... Continuous 12)  Glucosamine-chondroitin Caps (Glucosamine-chondroit-vit c-mn) .... Take 1 capsule by mouth once a day 13)  Calcium Carbonate-vitamin D 600-400 Mg-unit Tabs (Calcium carbonate-vitamin d) .... Take 1 tablet by mouth two times a day 14)  Vitamin D 1000 Unit Tabs (Cholecalciferol) .... Take 1 tablet by mouth once a day 15)  Xopenex 1.25 Mg/94ml Nebu (Levalbuterol hcl) .... One every 4 hours as needed 16)  Xopenex  Hfa 45 Mcg/act Aero (Levalbuterol tartrate) .... Inhale 2 puffs every 4 hours as needed 17)  Tylenol Arthritis Pain 650 Mg Cr-tabs (Acetaminophen) .... Per bottle 18)  Mucinex Dm 30-600 Mg Xr12h-tab (Dextromethorphan-guaifenesin) .Marland Kitchen.. 1 every 12 hours as needed 19)  Tramadol Hcl 50 Mg Tabs (Tramadol hcl) .... Take 1-2 tablet by mouth every 4 hours as  needed for cough 20)  Fexofenadine Hcl 180 Mg Tabs (Fexofenadine hcl) .... Take 1 tablet by mouth once a day 21)  Cvs Saline Nasal Spray 0.65 % Soln (Saline) .... 2 puffs every 4 hours as needed 22)  Afrin Nasal Spray 0.05 % Soln (Oxymetazoline hcl) .Marland Kitchen.. 1 puff two times a day x5days 23)  Hydrocodone-homatropine 5-1.5 Mg/1ml Syrp (Hydrocodone-homatropine) .Marland Kitchen.. 1 tsp by mouth q 6 hrs as needed cough 24)  Avelox 400 Mg Tabs (Moxifloxacin hcl) .Marland Kitchen.. 1 by mouth once daily 25)  Prednisone 10 Mg Tabs (Prednisone) .... 6po qd for 3days, then 4po qd for 3days, then 3po qd for 3days, then 2po qd for 3days, then 1po qd for 3 days, then stop  Patient Instructions: 1)  you had the steroid shot today 2)  Please take all new medications as prescribed 3)  Continue all previous medications as before this visit 4)  Please go to Radiology in the basement level for your X-Ray today  5)  Please schedule a follow-up appointment in October 2011 with CPX labs Prescriptions: PREDNISONE 10 MG TABS (PREDNISONE) 6po qd for 3days, then 4po qd for 3days, then 3po qd for 3days, then 2po qd for 3days, then 1po qd for 3 days, then stop  #48 x 0   Entered and Authorized by:   Len Blalock  Zyron Deeley MD   Signed by:   Corwin Levins MD on 01/16/2009   Method used:   Print then Give to Patient   RxID:   [id number] HYDROCODONE-HOMATROPINE 5-1.5 MG/5ML SYRP (HYDROCODONE-HOMATROPINE) 1 tsp by mouth q 6 hrs as needed cough  #6 oz x 1   Entered and Authorized by:   Corwin Levins MD   Signed by:   Corwin Levins MD on 01/16/2009   Method used:   Print then Give to Patient   RxID:    [id number] AVELOX 400 MG TABS (MOXIFLOXACIN HCL) 1 by mouth once daily  #10 x 0   Entered and Authorized by:   Corwin Levins MD   Signed by:   Corwin Levins MD on 01/16/2009   Method used:   Print then Give to Patient   RxID:   [id number]    Medication Administration  Injection # 1:    Medication: Depo- Medrol 40mg     Diagnosis: OBSTRUCTIVE CHRONIC BRONCHITIS WITH EXACERBATION (ICD-491.21)    Route: IM    Site: RUOQ gluteus    Exp Date: 10/2009    Lot #: [id number]    Mfr: Teva    Given by: Zella Ball Ewing (January 16, 2009 12:09 PM)  Injection # 2:    Medication: Depo- Medrol 80mg     Diagnosis: OBSTRUCTIVE CHRONIC BRONCHITIS WITH EXACERBATION (ICD-491.21)    Route: IM    Site: RUOQ gluteus    Exp Date: 10/2009    Lot #: [id number]    Mfr: Teva    Given by: Zella Ball Ewing (January 16, 2009 12:09 PM)  Orders Added: 1)  Depo- Medrol 40mg  [J1030] 2)  Depo- Medrol 80mg  [J1040] 3)  Admin of Therapeutic Inj  intramuscular or subcutaneous [96372] 4)  T-2 View CXR, Same Day [71020.5TC] 5)  Est. Patient Level IV [42595]

## 2010-02-10 NOTE — Progress Notes (Signed)
Summary: rx  Phone Note Call from Patient Call back at Home Phone [phone]   Caller: Patient Call For: Alexianna Nachreiner Reason for Call: Talk to Nurse Summary of Call: Need Symbicort & xopenex called in to [facility].  90 day suppl. Initial call taken by: Eugene Gavia,  Jun 05, 2009 1:23 PM  Follow-up for Phone Call        pt states needs xopenes inhaler and symbicort sent to [facility]. rx sen t. pt aware. Carron Curie CMA  Jun 05, 2009 3:00 PM     Prescriptions: XOPENEX HFA 45 MCG/ACT  AERO (LEVALBUTEROL TARTRATE) inhale 2 puffs every 4 hours as needed  #3 x 3   Entered by:   Carron Curie CMA   Authorized by:   Nyoka Cowden MD   Signed by:   Carron Curie CMA on 06/05/2009   Method used:   Faxed to ...       [facility]* (mail-order)             ,          Ph: [phone]       Fax: [phone]   RxID:   [id number] SYMBICORT 160-4.5 MCG/ACT  AERO (BUDESONIDE-FORMOTEROL FUMARATE) Two puffs twice daily  #3 x 3   Entered by:   Carron Curie CMA   Authorized by:   Nyoka Cowden MD   Signed by:   Carron Curie CMA on 06/05/2009   Method used:   Faxed to ...       [facility]* (mail-order)             ,          Ph: [phone]       Fax: [phone]   RxID:   [id number]

## 2010-02-10 NOTE — Medication Information (Signed)
Summary: [facility]  Anticoagulant Therapy  Managed by: Cloyde Reams, RN, BSN Referring MD: Valera Castle MD PCP: Corwin Levins MD Supervising MD: Mariah Milling Indication 1: Pulmonary embolism (ICD-415.19) Lab Used: [facility] Site: [place] INR POC 3.1 INR RANGE 2 - 3      Any changes in medication regimen? yes       Details: Took Avelox x 7 days and prednisone taper 07/17/09. Completed.      Allergies: 1)  ! Pcn  Anticoagulation Management History:      The patient is taking warfarin and comes in today for a routine follow up visit.  Negative risk factors for bleeding include an age less than 47 years old.  The bleeding index is 'low risk'.  Positive CHADS2 values include History of HTN.  Negative CHADS2 values include Age > 29 years old.  The start date was 06/13/2008.  Her last INR was 5.6.  Anticoagulation responsible provider: Frederika Hukill.  INR POC: 3.1.  Cuvette Lot#: [id number].  Exp: 08/2010.    Anticoagulation Management Assessment/Plan:      The patient's current anticoagulation dose is Coumadin 7.5 mg tabs: Take asd 1 tablet by mouth once a day.  The target INR is 2 - 3.  The next INR is due 08/20/2009.  Anticoagulation instructions were given to patient.  Results were reviewed/authorized by Cloyde Reams, RN, BSN.  She was notified by Cloyde Reams RN.         Prior Anticoagulation Instructions: INR 2.0  Continue on same dosage 1.5 tablets daily except 1 tablet on Sundays, Wednesdays, and Fridays.  Recheck in 3 weeks.    Current Anticoagulation Instructions: INR 3.1  Take 1/2 tablet today, then  resume same dosage 1.5 tablets daily except 1 tablet on Sundays, Wednesdays, and Fridays.  Recheck in 3 weeks.

## 2010-02-10 NOTE — Medication Information (Signed)
Summary: [employer]  [employer]   Imported By: Sherian Rein 11/13/2009 10:28:56  _____________________________________________________________________  External Attachment:    Type:   Image     Comment:   External Document

## 2010-02-10 NOTE — Assessment & Plan Note (Signed)
Summary: Pulmonary/ f/u ov with hfa 75%  p coaching   Primary Provider/Referring Provider:  Corwin Levins MD  CC:  Dyspnea- the same.  History of Present Illness: 35  yowf last smoked 2003 with   COPD with minimum asthmatic component with an FEV1 of 55%, a ratio of 53%, and a diffusing capacity 51% documented 07/25/06.  On 02 24 hours per day at 2lpm  6/09  weighed 271 and it was felt she was as debilitated as much by obesity as by airflow obstruction. baseline = when well = walk to mother in law's house but stops at least once because it's uphill and frequently requiring rescue rx.  March 01, 2008--post hospitalization follow up. Admitted 02/23/08 for post op respiratory difficulties. she underwent  left Wrist surgery, post op dyspnea, cough/wheezing. Admitted w/ aggressive resp toilet and IV steroids. Discharged. 02/27/08 improved.    May 01, 2008--Presents for follow up and med review and new  med calendar -  Since last visit doing much better.   June 20, 2008 ov post hosp for [facility] req d/c cardioversion but no change in rx,  walking 30 min daily on 02 c/o increaese cough day = night, dry.  wt = 265   September 16, 2009 2 month followup.  Pt c/o "not getting enough air"- worse over the past few wks.  Gets out of breath with "any movement".   new L sided cp x 3 weeks similar to previous clots.  Pain comes and goes no pattern not pleuritic, lasts up to several days at a time, post left chest but  no increase cough or worse with cough.   rec no change in rx and no change  December 16, 2009 ov cc dypnea no change after stopped spiriva didn't think it helped and wasn't sure the pills had any medication in them. no cough.  Pt denies any significant sore throat, dysphagia, itching, sneezing,  nasal congestion or excess secretions,  fever, chills, sweats, unintended wt loss, pleuritic or exertional cp, hempoptysis, change in activity tolerance  orthopnea pnd or leg swelling.       Current  Medications (verified): 1)  Furosemide 40 Mg  Tabs (Furosemide) .... Take 1 Tablet By Mouth Once A Day and 1 Extra Once Daily As Needed 2)  Coumadin 7.5 Mg Tabs (Warfarin Sodium) .... Take Asd 1 Tablet By Mouth Once A Day 3)  Klor-Con 10 10 Meq Tbcr (Potassium Chloride) .... Take 1 Tablet By Mouth Two Times A Day 4)  Fluoxetine Hcl 40 Mg Caps (Fluoxetine Hcl) .Marland Kitchen.. 1 Once Daily 5)  Omeprazole 20 Mg Cpdr (Omeprazole) .Marland Kitchen.. 1 By Mouth 30 Min Before 1st Meal 6)  Propafenone Hcl 325 Mg Xr12h-Cap (Propafenone Hcl) .... 2 Tab Daily 7)  Nasonex 50 Mcg/act Susp (Mometasone Furoate) .... 2 Puffs Each Nostril Two Times A Day 8)  Symbicort 160-4.5 Mcg/act  Aero (Budesonide-Formoterol Fumarate) .... Two Puffs Twice Daily 9)  Exforge 10-320 Mg  Tabs (Amlodipine Besylate-Valsartan) .Marland Kitchen.. 1tab By Mouth Once Daily 10)  Spiriva Handihaler 18 Mcg  Caps (Tiotropium Bromide Monohydrate) .... Inhale Contents of 1 Capsule Once A Day-Stopped Taking 11)  Oxygen 2l/min .... Continuous 12)  Glucosamine-Chondroitin   Caps (Glucosamine-Chondroit-Vit C-Mn) .... Take 1 Capsule By Mouth Once A Day 13)  Calcium Carbonate-Vitamin D 600-400 Mg-Unit  Tabs (Calcium Carbonate-Vitamin D) .... Take 1 Tablet By Mouth Two Times A Day 14)  Vitamin D 1000 Unit Tabs (Cholecalciferol) .... Take 1 Tablet By Mouth Once  A Day 15)  Xopenex 1.25 Mg/40ml  Nebu (Levalbuterol Hcl) .... One Every 4 Hours As Needed 16)  Xopenex Hfa 45 Mcg/act  Aero (Levalbuterol Tartrate) .... Inhale 2 Puffs Every 4 Hours As Needed 17)  Tylenol Arthritis Pain 650 Mg Cr-Tabs (Acetaminophen) .... Per Bottle 18)  Mucinex Dm 30-600 Mg Xr12h-Tab (Dextromethorphan-Guaifenesin) .Marland Kitchen.. 1 Every 12 Hours As Needed W/ Flutter 19)  Tramadol Hcl 50 Mg  Tabs (Tramadol Hcl) .... Take 1 Tablet By Mouth Every 4 Hours As  Needed For Cough 20)  Chlor-Trimeton 4 Mg Tabs (Chlorpheniramine Maleate) .Marland Kitchen.. 1 Every 4 Hours As Needed 21)  [facility]vs Saline Nasal Spray 0.65 %  Soln (Saline) .... 2 Puffs  Every 4 Hours As Needed 22)  Afrin Nasal Spray 0.05 % Soln (Oxymetazoline Hcl) .Marland Kitchen.. 1 Puff Two Times A Day X5days - Before Nasonex 23)  Prolia 60 Mg/ml Soln (Denosumab) .... Inject Once Every 6 Months  Allergies (verified): 1)  ! Pcn 2)  * Cymbalta  Past History:  Past Medical History: CHRONIC RHINITIS (ICD-472.0) EDEMA (ICD-782.3) ACUTE AND CHRONIC RESPIRATORY FAILURE (ICD-518.84) MORBID OBESITY (ICD-278.01)     - Target wt  =   179  for BMI < 30 peak wt 282     - Referred back to nutrition December 18, 2007  DEEP VENOUS THROMBOPHLEBITIS, HX OF (ICD-V12.52) COPD (ICD-496)........................................................Marland KitchenWert     - PFTs  07/25/06 FEV1 55% ratio 53, DLC0 51%      - HFA  50% December 16, 2009  DEPRESSION (ICD-311) OSTEOPOROSIS (ICD-733.00) last BMD 4/08 -2.4, intolerant of bisphosphonates  HYPERTENSION (ICD-401.9) Atrial fibrillation - paroxysmal Anticoagulation therapy Pulmonary embolism, hx of neg stress myoview 10/07 Anxiety glucose intolerance - on steroids Hyperlipidemia Complex medical regimen    - Med calendar done 05/01/2008  Vital Signs:  Patient profile:   60 year old female Weight:      274 pounds O2 Sat:      96 % on 2 L/min Temp:     98.1 degrees F oral Pulse rate:   81 / minute BP sitting:   140 / 72  (left arm) Cuff size:   large  Vitals Entered By: Vernie Murders (December 16, 2009 3:15 PM)  O2 Flow:  2 L/min  Physical Exam  Additional Exam:  Ambulatory healthy appearing in no acute distress. 280 February 07, 2008  >259 May 01, 2008>   277 .September 16, 2009 >  274  December 16, 2009  HEENT: nl dentition, turbinates, and orophanx. Nl external ear canals without cough reflex Neck without JVD/Nodes/TM Lungs coarse BS w/ few rhonchi RRR no m/r/g no edema Abd soft and benign with limited excursion in the supine position.  Ext warm without calf tenderness, cyanosis clubbing or edema Skin warm and dry without lesions      Impression & Recommendations:  Problem # 1:  COPD (ICD-496) GOLD II with morbid obesity > chronic resp failure     DDX of  difficult airways managment all start with A and  include Adherence, Ace Inhibitors, Acid Reflux, Active Sinus Disease, Alpha 1 Antitripsin deficiency, Anxiety masquerading as Airways dz,  ABPA,  allergy(esp in young), Aspiration (esp in elderly), Adverse effects of DPI,  Active smokers, plus one B  = Beta blocker use..    Adherence :  I spent extra time with the patient today explaining optimal mdi  technique.  This improved from  50-75% p coaching ok to leave off spiriva but really needs better  hfa if going to rely entirely on this   Each maintenance medication was reviewed in detail including most importantly the difference between maintenance and as needed and under what circumstances the prns are to be used.  This was done in the context of a medication calendar review which provided the patient with a user-friendly unambiguous mechanism for medication administration and reconciliation and provides an action plan for all active problems. It is critical that this be shown to every doctor  for modification during the office visit if necessary so the patient can use it as a working document.   Medications Added to Medication List This Visit: 1)  Furosemide 40 Mg Tabs (Furosemide) .... Take 1 tablet by mouth once a day and 1 extra once daily as needed 2)  Spiriva Handihaler 18 Mcg Caps (Tiotropium bromide monohydrate) .... Inhale contents of 1 capsule once a day-stopped taking  Other Orders: Est. Patient Level IV ([address]109) HFA Instruction [phone])  Patient Instructions: 1)  See calendar for specific medication instructions and bring it back for each and every office visit for every healthcare provider you see.  Without it,  you may not receive the best quality medical care that we feel you deserve.  2)  Work on inhaler technique:  relax and blow all the way out then  take a nice smooth deep breath back in, triggering the inhaler at same time you start breathing in hold a few seconds then rinse and gargle 3)  Return to office in 3 months, sooner if needed

## 2010-02-10 NOTE — Assessment & Plan Note (Signed)
Summary: Pulmonary/ eval cough   Primary Provider/Referring Provider:  Jonny Ruiz  CC:  6 wk followup.  Pt recently d/c'ed from [facility] on 06/16/08- had A Fib.  Today she c/o cough that started approx 2 wks ago.  Cough is non prod-"tickle in throat".  .  History of Present Illness: 60  yowf last smoked 2003 with   COPD with minimum asthmatic component with an FEV1 of 55%, a ratio of 53%, and a diffusing capacity 51% documented 07/25/06.  On 02 24 hours per day at 2lpm  6/09  weighed 271 and it was felt she was as debilitated as much by obesity as by airflow obstruction. baseline = when well = walk to mother in law's house but stops at least once because it's uphill and frequently requiring rescue rx.  March 01, 2008--post hospitalization follow up. Admitted 02/23/08 for post op respiratory difficulties. she underwent  left Wrist surgery, post op dyspnea, cough/wheezing. Admitted w/ aggressive resp toilet and IV steroids. Discharged. 02/27/08 improved.    May 01, 2008--Presents for follow up and med review and new  med calendar -  Since last visit doing much better.   June 20, 2008 ov post hosp for [facility] req d/c cardioversion but no change in rx,  walking 30 min daily on 02 c/o increaese cough day = night, dry.  no increase sob Pt denies any significant sore throat, nasal congestion or excess secretions, fever, chills, sweats, unintended wt loss, pleuritic or exertional cp, orthopnea pnd or leg swelling.  Pt also denies any obvious fluctuation in symptoms with weather or environmental change or other alleviating or aggravating factors.       Current Medications (verified): 1)  Furosemide 40 Mg  Tabs (Furosemide) .... Take 1 Tablet By Mouth Once A Day 2)  Coumadin 7.5 Mg Tabs (Warfarin Sodium) .... Take Asd 1 Tablet By Mouth Once A Day 3)  Klor-Con 10 10 Meq Tbcr (Potassium Chloride) .... Take 2 Tabs Every Am and 1 Tab At Bedtime  4)  Fluoxetine Hcl 40 Mg Caps (Fluoxetine Hcl) .... Take 1 Tablet By Mouth Once A Day 5)  Omeprazole 20 Mg Cpdr (Omeprazole) .Marland Kitchen.. 1 By Mouth 30 Min Before 1st Meal 6)  Rythmol Sr 325 Mg Cp12 (Propafenone Hcl) .... Two Times A Day 7)  Veramyst 27.5 Mcg/spray  Susp (Fluticasone Furoate) .... One Spray Two Times A Day 8)  Symbicort 160-4.5 Mcg/act  Aero (Budesonide-Formoterol Fumarate) .... Two Puffs Twice Daily 9)  Exforge 10-320 Mg  Tabs (Amlodipine Besylate-Valsartan) .Marland Kitchen.. 1tab By Mouth Once Daily 10)  Spiriva Handihaler 18 Mcg  Caps (Tiotropium Bromide Monohydrate) .... Inhale Contents of 1 Capsule Once A Day 11)  Oxygen 2l Liquid .... Continuous 12)  Glucosamine-Chondroitin   Caps (Glucosamine-Chondroit-Vit C-Mn) .... Take 1 Capsule By Mouth Once A Day 13)  Calcium Carbonate-Vitamin D 600-400 Mg-Unit  Tabs (Calcium Carbonate-Vitamin D) .... Take 1 Tablet By Mouth Two Times A Day 14)  Vitamin D 1000 Unit Tabs (Cholecalciferol) .... Take 1 Tablet By Mouth Once A Day 15)  Forteo 600 Mcg/2.63ml Soln (Teriparatide (Recombinant)) .... 20 Micrograms Subcutaneously Once Daily 16)  Xopenex 1.25 Mg/13ml  Nebu (Levalbuterol Hcl) .... One Every 4 Hours As Needed 17)  Xopenex Hfa 45 Mcg/act  Aero (Levalbuterol Tartrate) .... Inhale 2 Puffs Every 4 Hours As Needed 18)  Tylenol Arthritis Pain 650 Mg Cr-Tabs (Acetaminophen) .... Per Bottle 19)  Mucinex Dm 30-600 Mg Xr12h-Tab (Dextromethorphan-Guaifenesin) .Marland Kitchen.. 1 Every 12 Hours As Needed 20)  Tramadol Hcl  50 Mg  Tabs (Tramadol Hcl) .... Take 1 Tablet By Mouth Every 4 Hours As Needed 21)  Fexofenadine Hcl 180 Mg Tabs (Fexofenadine Hcl) .... Take 1 Tablet By Mouth Once A Day 22)  Cvs Saline Nasal Spray 0.65 %  Soln (Saline) .... 2 Puffs Every 4 Hours As Needed 23)  Afrin Nasal Spray 0.05 % Soln (Oxymetazoline Hcl) .Marland Kitchen.. 1 Puff Two Times A Day X5days 24)  Furosemide 40 Mg Tabs (Furosemide) .Marland Kitchen.. 1 Extra Daily As Needed  Allergies (verified): 1)  ! Pcn   Past History:  Past Medical History: CHRONIC RHINITIS (ICD-472.0) EDEMA (ICD-782.3) ACUTE AND CHRONIC RESPIRATORY FAILURE (ICD-518.84) MORBID OBESITY (ICD-278.01)     - Target wt  =   179  for BMI < 30 peak wt 282     - Referred back to nutrition December 18, 2007  DEEP VENOUS THROMBOPHLEBITIS, HX OF (ICD-V12.52) COPD (ICD-496)     - Pft's 07/25/06 FEV1 55% ratio 53, DLC0 51% DEPRESSION (ICD-311) OSTEOPOROSIS (ICD-733.00) last BMD 4/08 -2.4, intolerant of bisphosphonates  HYPERTENSION (ICD-401.9) Atrial fibrillation - paroxysmal Anticoagulation therapy Pulmonary embolism, hx of neg stress myoview 10/07 Anxiety glucose intolerance - on steroids Hyperlipidemia Complex medical regimen    - Med calendar done 05/01/2008  Vital Signs:  Patient profile:   60 year old female Weight:      265.25 pounds O2 Sat:      98 % on 2 L/min Temp:     98.2 degrees F oral Pulse rate:   70 / minute BP sitting:   110 / 70  (left arm) Cuff size:   large  Vitals Entered By: Vernie Murders (June 20, 2008 10:05 AM)  O2 Flow:  2 L/min  Physical Exam  Additional Exam:  Ambulatory healthy appearing in no acute distress. Afeb with normal vital signs     280 February 07, 2008 > 284 March 20, 2008 >275 April 09, 2008 >>259 May 01, 2008> 165 June 20, 2008  HEENT: nl dentition, turbinates, and orophanx. Nl external ear canals without cough reflex Neck without JVD/Nodes/TM Lungs coarse BS w/ few rhonchi RRR no m/r/g no edema Abd soft and benign with limited excursion in the supine position.  Ext warm without calf tenderness, cyanosis clubbing or edema Skin warm and dry without lesions     Impression & Recommendations:  Problem # 1:  COPD (ICD-496) combined with obesity leading to chronic 02 dep but no need for systemic steroids    Each maintenance medication was reviewed in detail including most importantly the difference between maintenance and as needed and under what circumstances the prns are to be used. This was done in the context of a medication calendar review which provided the patient with a user-friendly unambiguous mechanism for medication administration and reconciliation and provides an action plan for all active problems. It is critical that this be shown to every doctor  for modification during the office visit if necessary so the patient can use it as a working document.   optimal longterm rx established therefore return to pulmonary clinic prn  Problem # 2:  HYPERTENSION (ICD-401.9)  Her updated medication list for this problem includes:    Furosemide 40 Mg Tabs (Furosemide) .Marland Kitchen... Take 1 tablet by mouth once a day    Exforge 10-320 Mg Tabs (Amlodipine besylate-valsartan) .Marland Kitchen... 1tab by mouth once daily    Furosemide 40 Mg Tabs (Furosemide) .Marland Kitchen... 1 extra daily as needed  Orders: Est. Patient Level IV (70350)  Problem # 3:  COUGH (ICD-786.2)  Classic upper airway cough syndrome, so named because it's frequently impossible to sort out how much is LPR vs CR/sinusitis with freq throat clearing generating secondary extra esophageal GERD from wide swings in gastric pressure that occur with throat clearing, promoting self use of mint and menthol lozenges that reduce the lower esophageal sphincter tone and exacerbate the problem further These are the same pts who not infrequently have failed to tolerate ace inhibitors or report having reflux symptoms that don't respond to standard doses of PPI.   try chlortrimeton as needed and eliminate cyclical coughing with 6 days of prednisone  See instructions for specific recommendations   Orders: Est. Patient Level IV (16109)  Medications Added to Medication List This Visit: 1)  Klor-con 10 10 Meq Tbcr (Potassium chloride) .... Take 2 tabs every am and 1 tab at bedtime  2)  Omeprazole 20 Mg Cpdr (Omeprazole) .Marland Kitchen.. 1 by mouth 30 min before 1st meal 3)  Tramadol Hcl 50 Mg Tabs (Tramadol hcl) .... Take 1-2 tablet by mouth every 4 hours as  needed for cough 4)  Prednisone 10 Mg Tabs (Prednisone) .... 4 each am x 2days, 2x2days, 1x2days and stop  Complete Medication List: 1)  Furosemide 40 Mg Tabs (Furosemide) .... Take 1 tablet by mouth once a day 2)  Coumadin 7.5 Mg Tabs (Warfarin sodium) .... Take asd 1 tablet by mouth once a day 3)  Klor-con 10 10 Meq Tbcr (Potassium chloride) .... Take 2 tabs every am and 1 tab at bedtime 4)  Fluoxetine Hcl 40 Mg Caps (Fluoxetine hcl) .... Take 1 tablet by mouth once a day 5)  Omeprazole 20 Mg Cpdr (Omeprazole) .Marland Kitchen.. 1 by mouth 30 min before 1st meal 6)  Rythmol Sr 325 Mg Cp12 (Propafenone hcl) .... Two times a day 7)  Veramyst 27.5 Mcg/spray Susp (Fluticasone furoate) .... One spray two times a day 8)  Symbicort 160-4.5 Mcg/act Aero (Budesonide-formoterol fumarate) .... Two puffs twice daily 9)  Exforge 10-320 Mg Tabs (Amlodipine besylate-valsartan) .Marland Kitchen.. 1tab by mouth once daily 10)  Spiriva Handihaler 18 Mcg Caps (Tiotropium bromide monohydrate) .... Inhale contents of 1 capsule once a day 11)  Oxygen 2l Liquid  .... Continuous 12)  Glucosamine-chondroitin Caps (Glucosamine-chondroit-vit c-mn) .... Take 1 capsule by mouth once a day 13)  Calcium Carbonate-vitamin D 600-400 Mg-unit Tabs (Calcium carbonate-vitamin d) .... Take 1 tablet by mouth two times a day 14)  Vitamin D 1000 Unit Tabs (Cholecalciferol) .... Take 1 tablet by mouth once a day 15)  Forteo 600 Mcg/2.19ml Soln (Teriparatide (recombinant)) .... 20 micrograms subcutaneously once daily 16)  Xopenex 1.25 Mg/10ml Nebu (Levalbuterol hcl) .... One every 4 hours as needed 17)  Xopenex Hfa 45 Mcg/act Aero (Levalbuterol tartrate) .... Inhale 2 puffs every 4 hours as needed 18)  Tylenol Arthritis Pain 650 Mg Cr-tabs (Acetaminophen) .... Per bottle  19)  Mucinex Dm 30-600 Mg Xr12h-tab (Dextromethorphan-guaifenesin) .Marland Kitchen.. 1 every 12 hours as needed 20)  Tramadol Hcl 50 Mg Tabs (Tramadol hcl) .... Take 1-2 tablet by mouth every 4 hours as  needed for cough 21)  Fexofenadine Hcl 180 Mg Tabs (Fexofenadine hcl) .... Take 1 tablet by mouth once a day 22)  Cvs Saline Nasal Spray 0.65 % Soln (Saline) .... 2 puffs every 4 hours as needed 23)  Afrin Nasal Spray 0.05 % Soln (Oxymetazoline hcl) .Marland Kitchen.. 1 puff two times a day x5days 24)  Furosemide 40 Mg Tabs (Furosemide) .Marland Kitchen.. 1 extra daily as needed 25)  Prednisone  10 Mg Tabs (Prednisone) .... 4 each am x 2days, 2x2days, 1x2days and stop  Patient Instructions: 1)  GERD (gastroesophageal reflux disease) was discussed. It is a common cause of respiratory symptoms. It commonly presents in the absence of heartburn. GERD can be treated with medication, but also with lifestyle changes including avoidance of late meals, excessive alcohol, smoking cessation, and avoid fatty foods, chocolate, peppermint, colas, red wine, and acidic juices such as orange juice. NO MINT OR MENTHOL PRODUCTS(no cough drops) 2)  USE SUGARLESS CANDY INSTEAD (jolley ranchers)  3)  Prednisone 4 each am x 2days, 2x2days, 1x2days and stop  4)  stop zyrtec and try clortrimeton 4mg  every 4 hours for drainage 5)  Take mucinex dm  every 12 hours and add tramadol 50 mg up to 2every 4 hours to suppress the urge to cough. Swallowing water or using ice chips/non mint and menthol containing candies (such as lifesavers or sugarless jolly ranchers) are also effective.  6)  Return if not 100%  Prescriptions: TRAMADOL HCL 50 MG  TABS (TRAMADOL HCL) Take 1-2 tablet by mouth every 4 hours as  needed for cough  #40 x 0   Entered and Authorized by:   Nyoka Cowden MD   Signed by:   Nyoka Cowden MD on 06/20/2008   Method used:   Electronically to        [facility] [address] [phone]* (retail)       [address]       [place], [place]  [address]       Ph: [phone] or [phone]       Fax: [phone]   RxID:   [id number] PREDNISONE 10 MG  TABS (PREDNISONE) 4 each am x 2days, 2x2days, 1x2days and stop  #14 x 0   Entered and Authorized by:   Nyoka Cowden MD   Signed by:   Nyoka Cowden MD on 06/20/2008   Method used:   [facility]        [facility] [address] [facility] [address] [phone]* (retail)       [address]       [place], [place]  [address]       Ph: [phone] or [phone]       Fax: [phone]   RxID:   [id number] OMEPRAZOLE 20 MG CPDR (OMEPRAZOLE) 1 by mouth 30 min before 1st meal  #34 x 11   Entered and Authorized by:   Nyoka Cowden MD   Signed by:   Nyoka Cowden MD on 06/20/2008   Method used:   [facility]        [facility]  [address] [phone]* (retail)       [address]       [place], [place]  [id number]       Ph: [phone] or [phone]       Fax: [phone]   RxID:   [id number]

## 2010-02-10 NOTE — Progress Notes (Signed)
Summary: REFILLS  Phone Note Call from Patient Call back at Home Phone [phone]   Caller: Patient Summary of Call: REFILLS ON KLOR-CON 10 MG AND AND FLUOXETINE 40 MG AND WARFARIN 7.5 MG  HER NUMBER IS [phone] Initial call taken by: Hilarie Fredrickson,  July 10, 2007 2:14 PM  Follow-up for Phone Call        Returned call to phone number listed//no answer, sent rx to [facility] [address] since pt did not specify pharmacy on messg Follow-up by: Rock Nephew CMA,  July 10, 2007 3:18 PM      Prescriptions: FLUOXETINE HCL 40 MG CAPS (FLUOXETINE HCL) Take 1 tablet by mouth once a day  #30 x 11   Entered by:   Rock Nephew CMA   Authorized by:   Corwin Levins MD   Signed by:   Rock Nephew CMA on 07/10/2007   Method used:   Electronically sent to ...       [address]*       [address]       [place], [place]  [address]       Ph: [phone]       Fax: [phone]   RxID:   [id number] KLOR-CON 10 10 MEQ TBCR (POTASSIUM CHLORIDE) Take 1 tablet by mouth once a day  #30 x 11   Entered by:   Rock Nephew CMA   Authorized by:   Corwin Levins MD   Signed by:   Rock Nephew CMA on 07/10/2007   Method used:   Electronically sent to ...       [address]       [address]       [place], [place]  [address]       Ph: [phone]       Fax: [phone]   RxID:   [id number] COUMADIN 7.5 MG TABS (WARFARIN SODIUM) Take 1 tablet by mouth once a day, 11mg  on Mondays  #100 x 3   Entered by:   Rock Nephew CMA   Authorized by:   Corwin Levins MD   Signed by:   Rock Nephew CMA on 07/10/2007   Method used:   Electronically sent to ...       [address]       [address]       [place], [place]  [address]       Ph: [phone]       Fax: [phone]   RxID:   [id number]

## 2010-02-10 NOTE — Assessment & Plan Note (Signed)
Summary: 4 MTH PHYSICAL STC   Vital Signs:  Patient profile:   60 year old female Height:      65 inches Weight:      274.50 pounds BMI:     45.84 O2 Sat:      97 % on Room air Temp:     98.3 degrees F oral Pulse rate:   83 / minute BP sitting:   132 / 72  (left arm) Cuff size:   large  Vitals Entered By: Zella Ball Ewing CMA Duncan Dull) (October 16, 2009 11:08 AM)  O2 Flow:  Room air  Preventive Care Screening  Bone Density:    Date:  09/05/2007    Next Due:  09/2009    Results:  abnormal std dev  Colonoscopy:    Next Due:  02/2014  Last Flu Shot:    Date:  10/16/2009    Results:  Fluvax 3+     sees GYn fo yearly pap and mamogram  CC: Adult Physical/RE   Primary Care Provider:  Corwin Levins MD  CC:  Adult Physical/RE.  History of Present Illness: here for wellness and f/u; overall doing well;  Pt denies CP, worsening sob, doe, wheezing, orthopnea, pnd, worsening LE edema, palps, dizziness or syncope  Pt denies new neuro symptoms such as headache, facial or extremity weakness  Pt denies polydipsia, polyuria.  Overall good compliance with meds, trying to follow low chol, DM diet, wt stable, little excercise however   Has new rash to right distal leg, no pain or otich, some better with moisteruzer  Also with significnat several wks nasal allergy symptmos depsite otc allegra, no fever, headache, but has pressure, no bleeding or ear symptoms, or incr couh  had labs drawn this am  Problems Prior to Update: 1)  Chest Pain  (ICD-786.50) 2)  Rhabdomyolysis  (ICD-728.88) 3)  Cramp in Limb  (ICD-729.82) 4)  Fracture, Rib, Right  (ICD-807.00) 5)  Sinusitis- Acute-nos  (ICD-461.9) 6)  Preventive Health Care  (ICD-V70.0) 7)  Premature Ventricular Contractions  (ICD-427.69) 8)  Sinusitis- Acute-nos  (ICD-461.9) 9)  Uti  (ICD-599.0) 10)  Dysuria  (ICD-788.1) 11)  Atrial Flutter  (ICD-427.32) 12)  Cough  (ICD-786.2) 13)  Ankle Pain  (ICD-719.47) 14)  Dependent Edema, Right Leg   (ICD-782.3) 15)  Pneumonia Organism Nos  (ICD-486) 16)  Preventive Health Care  (ICD-V70.0) 17)  Conjunctivitis  (ICD-372.30) 18)  Vitamin D Deficiency  (ICD-268.9) 19)  Preventive Health Care  (ICD-V70.0) 20)  Hyperlipidemia  (ICD-272.4) 21)  Glucose Intolerance  (ICD-271.3) 22)  Anxiety  (ICD-300.00) 23)  Pulmonary Embolism, Hx of  (ICD-V12.51) 24)  Anticoagulation Therapy  (ICD-V58.61) 25)  Atrial Fibrillation  (ICD-427.31) 26)  Chronic Rhinitis  (ICD-472.0) 27)  Edema  (ICD-782.3) 28)  Obstructive Chronic Bronchitis With Exacerbation  (ICD-491.21) 29)  Acute and Chronic Respiratory Failure  (ICD-518.84) 30)  Morbid Obesity  (ICD-278.01) 31)  Deep Venous Thrombophlebitis, Hx of  (ICD-V12.52) 32)  COPD  (ICD-496) 33)  Depression  (ICD-311) 34)  Osteoporosis  (ICD-733.00) 35)  Hypertension  (ICD-401.9)  Medications Prior to Update: 1)  Furosemide 40 Mg  Tabs (Furosemide) .... Take 1 Tablet By Mouth Once A Day 2)  Coumadin 7.5 Mg Tabs (Warfarin Sodium) .... Take Asd 1 Tablet By Mouth Once A Day 3)  Klor-Con 10 10 Meq Tbcr (Potassium Chloride) .... Take 1 Tablet By Mouth Two Times A Day 4)  Fluoxetine Hcl 40 Mg Caps (Fluoxetine Hcl) .Marland Kitchen.. 1 Once Daily 5)  Omeprazole 20 Mg Cpdr (Omeprazole) .Marland Kitchen.. 1 By Mouth 30 Min Before 1st Meal 6)  Propafenone Hcl 325 Mg Xr12h-Cap (Propafenone Hcl) .Marland Kitchen.. 1 Tab Daily 7)  Nasonex 50 Mcg/act Susp (Mometasone Furoate) .... 2 Puffs Each Nostril Two Times A Day 8)  Symbicort 160-4.5 Mcg/act  Aero (Budesonide-Formoterol Fumarate) .... Two Puffs Twice Daily 9)  Exforge 10-320 Mg  Tabs (Amlodipine Besylate-Valsartan) .Marland Kitchen.. 1tab By Mouth Once Daily 10)  Spiriva Handihaler 18 Mcg  Caps (Tiotropium Bromide Monohydrate) .... Inhale Contents of 1 Capsule Once A Day 11)  Oxygen 2l/min .... Continuous 12)  Glucosamine-Chondroitin   Caps (Glucosamine-Chondroit-Vit C-Mn) .... Take 1 Capsule By Mouth Once A Day 13)  Calcium Carbonate-Vitamin D 600-400 Mg-Unit  Tabs  (Calcium Carbonate-Vitamin D) .... Take 1 Tablet By Mouth Two Times A Day 14)  Vitamin D 1000 Unit Tabs (Cholecalciferol) .... Take 1 Tablet By Mouth Once A Day 15)  Xopenex 1.25 Mg/55ml  Nebu (Levalbuterol Hcl) .... One Every 4 Hours As Needed 16)  Xopenex Hfa 45 Mcg/act  Aero (Levalbuterol Tartrate) .... Inhale 2 Puffs Every 4 Hours As Needed 17)  Tylenol Arthritis Pain 650 Mg Cr-Tabs (Acetaminophen) .... Per Bottle 18)  Mucinex Dm 30-600 Mg Xr12h-Tab (Dextromethorphan-Guaifenesin) .Marland Kitchen.. 1 Every 12 Hours As Needed W/ Flutter 19)  Tramadol Hcl 50 Mg  Tabs (Tramadol Hcl) .... Take 1 Tablet By Mouth Every 4 Hours As  Needed For Cough 20)  Chlor-Trimeton 4 Mg Tabs (Chlorpheniramine Maleate) .Marland Kitchen.. 1 Every 4 Hours As Needed 21)  Cvs Saline Nasal Spray 0.65 %  Soln (Saline) .... 2 Puffs Every 4 Hours As Needed 22)  Afrin Nasal Spray 0.05 % Soln (Oxymetazoline Hcl) .Marland Kitchen.. 1 Puff Two Times A Day X5days - Before Nasonex 23)  Furosemide 40 Mg Tabs (Furosemide) .Marland Kitchen.. 1 Extra Daily As Needed 24)  Flexeril 5 Mg Tabs (Cyclobenzaprine Hcl) .Marland Kitchen.. 1 By Mouth Three Times A Day As Needed  Current Medications (verified): 1)  Furosemide 40 Mg  Tabs (Furosemide) .... Take 1 Tablet By Mouth Once A Day 2)  Coumadin 7.5 Mg Tabs (Warfarin Sodium) .... Take Asd 1 Tablet By Mouth Once A Day 3)  Klor-Con 10 10 Meq Tbcr (Potassium Chloride) .... Take 1 Tablet By Mouth Two Times A Day 4)  Fluoxetine Hcl 40 Mg Caps (Fluoxetine Hcl) .Marland Kitchen.. 1 Once Daily 5)  Omeprazole 20 Mg Cpdr (Omeprazole) .Marland Kitchen.. 1 By Mouth 30 Min Before 1st Meal 6)  Propafenone Hcl 325 Mg Xr12h-Cap (Propafenone Hcl) .Marland Kitchen.. 1 Tab Daily 7)  Nasonex 50 Mcg/act Susp (Mometasone Furoate) .... 2 Puffs Each Nostril Two Times A Day 8)  Symbicort 160-4.5 Mcg/act  Aero (Budesonide-Formoterol Fumarate) .... Two Puffs Twice Daily 9)  Exforge 10-320 Mg  Tabs (Amlodipine Besylate-Valsartan) .Marland Kitchen.. 1tab By Mouth Once Daily 10)  Spiriva Handihaler 18 Mcg  Caps (Tiotropium Bromide  Monohydrate) .... Inhale Contents of 1 Capsule Once A Day 11)  Oxygen 2l/min .... Continuous 12)  Glucosamine-Chondroitin   Caps (Glucosamine-Chondroit-Vit C-Mn) .... Take 1 Capsule By Mouth Once A Day 13)  Calcium Carbonate-Vitamin D 600-400 Mg-Unit  Tabs (Calcium Carbonate-Vitamin D) .... Take 1 Tablet By Mouth Two Times A Day 14)  Vitamin D 1000 Unit Tabs (Cholecalciferol) .... Take 1 Tablet By Mouth Once A Day 15)  Xopenex 1.25 Mg/98ml  Nebu (Levalbuterol Hcl) .... One Every 4 Hours As Needed 16)  Xopenex Hfa 45 Mcg/act  Aero (Levalbuterol Tartrate) .... Inhale 2 Puffs Every 4 Hours As Needed 17)  Tylenol Arthritis Pain 650  Mg Cr-Tabs (Acetaminophen) .... Per Bottle 18)  Mucinex Dm 30-600 Mg Xr12h-Tab (Dextromethorphan-Guaifenesin) .Marland Kitchen.. 1 Every 12 Hours As Needed W/ Flutter 19)  Tramadol Hcl 50 Mg  Tabs (Tramadol Hcl) .... Take 1 Tablet By Mouth Every 4 Hours As  Needed For Cough 20)  Chlor-Trimeton 4 Mg Tabs (Chlorpheniramine Maleate) .Marland Kitchen.. 1 Every 4 Hours As Needed 21)  Cvs Saline Nasal Spray 0.65 %  Soln (Saline) .... 2 Puffs Every 4 Hours As Needed 22)  Afrin Nasal Spray 0.05 % Soln (Oxymetazoline Hcl) .Marland Kitchen.. 1 Puff Two Times A Day X5days - Before Nasonex  Allergies (verified): 1)  ! Pcn 2)  * Cymbalta  Past History:  Past Medical History: Last updated: 09/16/2009 CHRONIC RHINITIS (ICD-472.0) EDEMA (ICD-782.3) ACUTE AND CHRONIC RESPIRATORY FAILURE (ICD-518.84) MORBID OBESITY (ICD-278.01)     - Target wt  =   179  for BMI < 30 peak wt 282     - Referred back to nutrition December 18, 2007  DEEP VENOUS THROMBOPHLEBITIS, HX OF (ICD-V12.52) COPD (ICD-496)........................................................Marland KitchenWert     - PFTs  07/25/06 FEV1 55% ratio 53, DLC0 51% DEPRESSION (ICD-311) OSTEOPOROSIS (ICD-733.00) last BMD 4/08 -2.4, intolerant of bisphosphonates  HYPERTENSION (ICD-401.9) Atrial fibrillation - paroxysmal Anticoagulation therapy Pulmonary embolism, hx of neg stress  myoview 10/07 Anxiety glucose intolerance - on steroids Hyperlipidemia Complex medical regimen    - Med calendar done 05/01/2008  Past Surgical History: Last updated: 04/15/2008 Hysterectomy Tonsillectomy Skin Graft to midde R finger-1975 s/p left arm fracture with fall off chair  Family History: Last updated: 07/07/2007 asthma and brothers and son.   Heart disease in mother.   Throat cancer in brother.  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         all otherwise negative per pt -    Physical Exam  General:  alert and overweight-appearing.   Head:  normocephalic and atraumatic.   Eyes:  vision grossly intact, pupils equal, and pupils round.   Ears:  R ear normal and L ear normal.   Nose:  nasal dischargemucosal pallor and mucosal edema.   Mouth:  no gingival abnormalities and pharynx pink and moist.   Neck:  supple and no masses.   Lungs:  normal respiratory effort, R decreased breath sounds, and L decreased breath sounds.   Heart:  normal rate and regular rhythm.   Abdomen:  soft, non-tender, and normal bowel sounds.     Msk:  no flank tender, no muscle tender or sweling, spine nontender Extremities:  no edema, no erythema  Neurologic:  cranial nerves II-XII intact and strength normal in all extremities.   Skin:  color normal and no rashes.   Psych:  not depressed appearing and slightly anxious.     Impression & Recommendations:  Problem # 1:  Preventive Health Care (ICD-V70.0) Overall doing  well, age appropriate education and counseling updated and referral for appropriate preventive services done unless declined, immunizations up to date or declined, diet counseling done if overweight, urged to quit smoking if smokes , most recent labs reviewed and current ordered if appropriate, ecg reviewed or declined (interpretation per ECG scanned in the EMR if done); information regarding Medicare Prevention requirements given if appropriate; speciality referrals updated as appropriate  Orders: EKG w/ Interpretation (93000)  Problem # 2:  CHRONIC RHINITIS (ICD-472.0)  with flare today - for depo IM today  Orders: Depo- Medrol 40mg  (J1030) Depo- Medrol 80mg  (J1040) Admin of Therapeutic Inj  intramuscular or subcutaneous (73710)  Problem # 3:  HYPERTENSION (ICD-401.9)  The following medications were removed from the medication list:    Furosemide 40 Mg Tabs (Furosemide) .Marland Kitchen... 1 extra daily as needed Her updated medication list for this problem includes:    Furosemide 40 Mg Tabs (Furosemide) .Marland Kitchen... Take 1 tablet by mouth once a day    Exforge 10-320 Mg Tabs (Amlodipine besylate-valsartan) .Marland Kitchen... 1tab by mouth once daily  BP today: 132/72 Prior BP: 150/82 (09/16/2009)  Labs Reviewed: K+: 4.1 (07/29/2009) Creat: : 0.7 (07/29/2009)   Chol: 179 (10/14/2008)   HDL: 35.60 (10/14/2008)   LDL: 117 (10/14/2008)   TG: 132.0 (10/14/2008) stable overall by hx and exam, ok to continue meds/tx as is   Complete Medication List: 1)  Furosemide 40 Mg Tabs (Furosemide) .... Take 1 tablet by mouth once a day 2)  Coumadin  7.5 Mg Tabs (Warfarin sodium) .... Take asd 1 tablet by mouth once a day 3)  Klor-con 10 10 Meq Tbcr (Potassium chloride) .... Take 1 tablet by mouth two times a day 4)  Fluoxetine Hcl 40 Mg Caps (Fluoxetine hcl) .Marland Kitchen.. 1 once daily 5)  Omeprazole 20 Mg Cpdr (Omeprazole) .Marland Kitchen.. 1 by mouth 30 min before 1st meal 6)  Propafenone Hcl 325 Mg Xr12h-cap (Propafenone hcl) .Marland Kitchen.. 1 tab daily 7)  Nasonex 50 Mcg/act Susp (Mometasone furoate) .... 2 puffs each nostril two times a day 8)  Symbicort 160-4.5 Mcg/act Aero (Budesonide-formoterol fumarate) .... Two puffs twice daily 9)  Exforge 10-320 Mg Tabs (Amlodipine besylate-valsartan) .Marland Kitchen.. 1tab by mouth once daily 10)  Spiriva Handihaler 18 Mcg Caps (Tiotropium bromide monohydrate) .... Inhale contents of 1 capsule once a day 11)  Oxygen 2l/min  .... Continuous 12)  Glucosamine-chondroitin Caps (Glucosamine-chondroit-vit c-mn) .... Take 1 capsule by mouth once a day 13)  Calcium Carbonate-vitamin D 600-400 Mg-unit Tabs (Calcium carbonate-vitamin d) .... Take 1 tablet by mouth two times a day 14)  Vitamin D 1000 Unit Tabs (Cholecalciferol) .... Take 1 tablet by mouth once a day 15)  Xopenex 1.25 Mg/44ml Nebu (Levalbuterol hcl) .... One every 4 hours as needed 16)  Xopenex Hfa 45 Mcg/act Aero (Levalbuterol tartrate) .... Inhale 2 puffs every 4 hours as needed 17)  Tylenol Arthritis Pain 650 Mg Cr-tabs (Acetaminophen) .... Per bottle 18)  Mucinex Dm 30-600 Mg Xr12h-tab (Dextromethorphan-guaifenesin) .Marland Kitchen.. 1 every 12 hours as needed w/ flutter 19)  Tramadol Hcl 50 Mg Tabs (Tramadol hcl) .... Take 1 tablet by mouth every 4 hours as  needed for cough 20)  Chlor-trimeton 4 Mg Tabs (Chlorpheniramine maleate) .Marland Kitchen.. 1 every 4 hours as needed 21)  Cvs Saline Nasal Spray 0.65 % Soln (Saline) .... 2 puffs every 4 hours as needed 22)  Afrin Nasal Spray 0.05 % Soln (Oxymetazoline hcl) .Marland Kitchen.. 1 puff two times a day x5days - before nasonex  Other Orders: Admin 1st  Vaccine  (78295) Flu Vaccine 44yrs + (62130) T-Bone Densitometry [phone])  Patient Instructions: 1)  today's VS - BP 132/70, HR 65 2)  you had the flu shot 3)  Please call the number on the [facility] for results of your testing 4)  the furosemide was sent to [facility] 5)  please schedule the bone densit test before leaving today 6)  you had the steroid shot today 7)  Continue all previous medications as before this visit  8)  Please schedule a follow-up appointment in 6 months. Prescriptions: FUROSEMIDE 40 MG  TABS (FUROSEMIDE) Take 1 tablet by mouth once a day  #90 x 3   Entered and Authorized by:   Corwin Levins MD   Signed by:   Corwin Levins MD on 10/16/2009   Method used:   Printed then faxed to ...       [facility] (mail-order)             , [place]         Ph: [phone]       Fax: [phone]   RxID:   [id number]     Flu Vaccine Consent Questions     Do you have a history of severe allergic reactions to this vaccine? no    Any prior history of allergic reactions to egg and/or gelatin? no    Do you have a sensitivity to the preservative Thimersol? no    Do you have a past history of Guillan-Barre Syndrome? no    Do you currently have an acute febrile illness? no    Have you ever had a severe reaction to latex? no    Vaccine information given and explained to patient? yes    Are you currently pregnant? no    Lot Number:[id number]   Exp Date:07/11/2010   Site Given  Left Deltoid IMlu     Medication Administration  Injection # 1:    Medication: Depo- Medrol 40mg     Diagnosis: CHRONIC RHINITIS (ICD-472.0)    Route: IM    Site: RUOQ gluteus    Exp Date: 04/2012    Lot #: [id number]    Mfr: Pharmacia    Comments: Patient received 120mg  Depo-Medrol    Patient tolerated injection without complications    Given by: Zella Ball Ewing CMA (AAMA) (October 16, 2009 11:54 AM)  Injection # 2:    Medication: Depo- Medrol 80mg     Diagnosis: CHRONIC RHINITIS (ICD-472.0)    Route: IM    Site: RUOQ  gluteus    Exp Date: 04/2012    Lot #: [id number]    Mfr: Pharmacia    Given by: Zella Ball Ewing CMA Duncan Dull) (October 16, 2009 11:54 AM)  Orders Added: 1)  Admin 1st Vaccine [90471] 2)  Flu Vaccine 101yrs + [63875] 3)  EKG w/ Interpretation [93000] 4)  T-Bone Densitometry [77080] 5)  Depo- Medrol 40mg  [J1030] 6)  Depo- Medrol 80mg  [J1040] 7)  Admin of Therapeutic Inj  intramuscular or subcutaneous [96372] 8)  Est. Patient 40-64 years [64332]

## 2010-02-10 NOTE — Medication Information (Signed)
Summary: CCR/AMD  Anticoagulant Therapy  Managed by: Charlena Cross, RN, BSN Referring MD: Valera Castle MD PCP: Gwen Pounds MD: Gala Romney MD, Reuel Boom Indication 1: Pulmonary embolism (ICD-415.19) Lab Used: [facility] Site: [place] INR POC 3.4 INR RANGE 2 - 3  Dietary changes: no    Health status changes: no    Bleeding/hemorrhagic complications: no    Recent/future hospitalizations: no    Any changes in medication regimen? yes       Details: on levaquin for 5 more days  Recent/future dental: no  Any missed doses?: no       Is patient compliant with meds? yes       Allergies: 1)  ! Pcn  Anticoagulation Management History:      The patient comes in today for her initial visit for anticoagulation therapy.  Negative risk factors for bleeding include an age less than 7 years old.  The bleeding index is 'low risk'.  Positive CHADS2 values include History of HTN.  Negative CHADS2 values include Age > 53 years old.  The start date was 06/13/2008.  Her last INR was 3.9 RATIO.  Anticoagulation responsible provider: Zunairah Devers MD, Reuel Boom.  INR POC: 3.4.    Anticoagulation Management Assessment/Plan:      The patient's current anticoagulation dose is Coumadin 7.5 mg tabs: Take asd 1 tablet by mouth once a day.  The target INR is 2 - 3.  The next INR is due 10/23/2008.  Anticoagulation instructions were given to patient.  Results were reviewed/authorized by Charlena Cross, RN, BSN.  She was notified by Charlena Cross, RN, BSN.         Prior Anticoagulation Instructions: no coumadin today, coumadin 7.5 mg tomorrow, then Coumadin 11.25mg  every day with 7.5 on Sun, Wed, Fri   Current Anticoagulation Instructions: none today then resume coumadin 11.25 mg every day with 7.5mg  on Sun Wed Fri

## 2010-02-10 NOTE — Medication Information (Signed)
Summary: rov/ewj  Anticoagulant Therapy  Managed by: Cloyde Reams, RN, BSN Referring MD: Valera Castle MD PCP: Corwin Levins MD Supervising MD: Mariah Milling Indication 1: Pulmonary embolism (ICD-415.19) Lab Used: [facility] Site: [place] INR POC 2.3 INR RANGE 2 - 3  Dietary changes: no    Health status changes: no    Bleeding/hemorrhagic complications: no    Recent/future hospitalizations: no    Any changes in medication regimen? no    Recent/future dental: no  Any missed doses?: no       Is patient compliant with meds? yes       Allergies: 1)  ! Pcn 2)  * Cymbalta  Anticoagulation Management History:      The patient is taking warfarin and comes in today for a routine follow up visit.  Negative risk factors for bleeding include an age less than 73 years old.  The bleeding index is 'low risk'.  Positive CHADS2 values include History of HTN.  Negative CHADS2 values include Age > 43 years old.  The start date was 06/13/2008.  Her last INR was 5.6.  Anticoagulation responsible provider: gollan.  INR POC: 2.3.  Cuvette Lot#: [id number].  Exp: 11/2010.    Anticoagulation Management Assessment/Plan:      The patient's current anticoagulation dose is Coumadin 7.5 mg tabs: Take asd 1 tablet by mouth once a day.  The target INR is 2 - 3.  The next INR is due 12/03/2009.  Anticoagulation instructions were given to patient.  Results were reviewed/authorized by Cloyde Reams, RN, BSN.  She was notified by Cloyde Reams RN.         Prior Anticoagulation Instructions: INR 3.0  Continue on same dosage 1 tablet daily except 1.5 tablets on Tuesdays, Thursdays, and Saturdays.  Recheck in 4 weeks.    Current Anticoagulation Instructions: INR 2.3  Continue on same dosage 1 tablet daily except 1.5 tablets on Tuesdays, Thursdays, and Saturdays.  Recheck in 4 weeks.

## 2010-02-10 NOTE — Assessment & Plan Note (Signed)
Summary: F6M/AMD   Visit Type:  6 mo f/u Primary Provider:  Corwin Levins MD  CC:  chest pain..sob pt on O2..edema/hands/feet.  History of Present Illness: MNelson returns today for evaluation and management of her multiple cardiac and vascular problems.  Please see assessment and plan for the list.  She feels the best she felt some time. She has lost 20 pounds in weight watchers. He is very compliant with her medications. Her lower extremity edema has been better. She denies any cough or an other than some occasional nosebleeds has not had hemoptysis. She denies any orthopnea, PND. She's had no palpitations. She's not thought she's had any atrial fibrillation or flutter.  She's had some well localized chest discomfort over her left breast. It is not radiate. It is not related to exertion.  Current Medications (verified): 1)  Furosemide 40 Mg  Tabs (Furosemide) .... Take 1 Tablet By Mouth Once A Day and As Needed For Excessive Swelling 2)  Coumadin 7.5 Mg Tabs (Warfarin Sodium) .... Take Asd 1 Tablet By Mouth Once A Day 3)  Klor-Con 10 10 Meq Tbcr (Potassium Chloride) .... Take 2 Tabs Every Am and 1 Tab At Bedtime 4)  Fluoxetine Hcl 40 Mg Caps (Fluoxetine Hcl) .... Take 1 Tablet By Mouth Once A Day 5)  Omeprazole 20 Mg Cpdr (Omeprazole) .Marland Kitchen.. 1 By Mouth 30 Min Before 1st Meal 6)  Propafenone Hcl 325 Mg Xr12h-Cap (Propafenone Hcl) .Marland Kitchen.. 1 Tab Daily 7)  Nasonex 50 Mcg/act Susp (Mometasone Furoate) .... 2 Puffs Daily 8)  Symbicort 160-4.5 Mcg/act  Aero (Budesonide-Formoterol Fumarate) .... Two Puffs Twice Daily 9)  Exforge 10-320 Mg  Tabs (Amlodipine Besylate-Valsartan) .Marland Kitchen.. 1tab By Mouth Once Daily 10)  Spiriva Handihaler 18 Mcg  Caps (Tiotropium Bromide Monohydrate) .... Inhale Contents of 1 Capsule Once A Day 11)  Oxygen 2l Liquid .... Continuous 12)  Glucosamine-Chondroitin   Caps (Glucosamine-Chondroit-Vit C-Mn) .... Take 1 Capsule By Mouth Once A Day 13)  Calcium Carbonate-Vitamin D  600-400 Mg-Unit  Tabs (Calcium Carbonate-Vitamin D) .... Take 1 Tablet By Mouth Two Times A Day 14)  Vitamin D 1000 Unit Tabs (Cholecalciferol) .... Take 1 Tablet By Mouth Once A Day 15)  Xopenex 1.25 Mg/34ml  Nebu (Levalbuterol Hcl) .... One Every 4 Hours As Needed 16)  Xopenex Hfa 45 Mcg/act  Aero (Levalbuterol Tartrate) .... Inhale 2 Puffs Every 4 Hours As Needed 17)  Tylenol Arthritis Pain 650 Mg Cr-Tabs (Acetaminophen) .... Per Bottle 18)  Mucinex Dm 30-600 Mg Xr12h-Tab (Dextromethorphan-Guaifenesin) .Marland Kitchen.. 1 Every 12 Hours As Needed 19)  Tramadol Hcl 50 Mg  Tabs (Tramadol Hcl) .... Take 1-2 Tablet By Mouth Every 4 Hours As  Needed For Cough 20)  Fexofenadine Hcl 180 Mg Tabs (Fexofenadine Hcl) .... Take 1 Tablet By Mouth Once A Day 21)  Cvs Saline Nasal Spray 0.65 %  Soln (Saline) .... 2 Puffs Every 4 Hours As Needed 22)  Afrin Nasal Spray 0.05 % Soln (Oxymetazoline Hcl) .Marland Kitchen.. 1 Puff Two Times A Day X5days 23)  Hydrocodone-Homatropine 5-1.5 Mg/37ml Syrp (Hydrocodone-Homatropine) .Marland Kitchen.. 1 Tsp By Mouth Q 6 Hrs As Needed Cough 24)  Doxycycline Hyclate 100 Mg Caps (Doxycycline Hyclate) .... One Twice Daily Before Meals 25)  Prednisone 10 Mg  Tabs (Prednisone) .... 4 Each Am X 2days, 2x2days, 1x2days and Stop  Allergies: 1)  ! Pcn  Past History:  Past Medical History: Last updated: 04/18/2009 CHRONIC RHINITIS (ICD-472.0) EDEMA (ICD-782.3) ACUTE AND CHRONIC RESPIRATORY FAILURE (ICD-518.84) MORBID OBESITY (ICD-278.01)     -  Target wt  =   179  for BMI < 30 peak wt 282     - Referred back to nutrition December 18, 2007  DEEP VENOUS THROMBOPHLEBITIS, HX OF (ICD-V12.52) COPD (ICD-496)........................................................Marland KitchenWert     - Pft's 07/25/06 FEV1 55% ratio 53, DLC0 51% DEPRESSION (ICD-311) OSTEOPOROSIS (ICD-733.00) last BMD 4/08 -2.4, intolerant of bisphosphonates  HYPERTENSION (ICD-401.9) Atrial fibrillation - paroxysmal Anticoagulation therapy Pulmonary embolism, hx  of neg stress myoview 10/07 Anxiety glucose intolerance - on steroids Hyperlipidemia Complex medical regimen    - Med calendar done 05/01/2008  Past Surgical History: Last updated: 04/15/2008 Hysterectomy Tonsillectomy Skin Graft to midde R finger-1975 s/p left arm fracture with fall off chair  Family History: Last updated: 07/07/2007 asthma and brothers and son.   Heart disease in mother.   Throat cancer in brother.  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  wearing O2, no acute distressobese.   Head:  normocephalic and atraumatic Eyes:  PERRLA/EOM intact; conjunctiva and lids normal. Neck:  Neck supple, no JVD. No masses, thyromegaly or abnormal cervical nodes. Lungs:  decreased breath sounds throughout, no rhonchi wheeze Heart:  poorly appreciated PMI, soft S1-S2, no Msk:  decreased ROM.  decreased ROM.   Pulses:  pulses normal in all 4 extremities Extremities:  no significant edema Neurologic:  Alert and oriented x 3. Skin:  Intact without lesions or rashes. Psych:  Normal affect.   EKG  Procedure date:  04/22/2009  Findings:      normal sinus rhythm, frequent PVCs which are unifocal, normal PR QRS and QTc interval, no change  Impression & Recommendations:  Problem # 1:  ATRIAL FLUTTER (ICD-427.32) Assessment Improved  Her updated medication list for this problem includes:     Coumadin 7.5 Mg Tabs (Warfarin sodium) .Marland Kitchen... Take asd 1 tablet by mouth once a day    Propafenone Hcl 325 Mg Xr12h-cap (Propafenone hcl) .Marland Kitchen... 1 tab daily  Orders: EKG w/ Interpretation (93000)  Problem # 2:  DEPENDENT EDEMA, RIGHT LEG (ICD-782.3) Assessment: Improved  Problem # 3:  PREMATURE VENTRICULAR CONTRACTIONS (ICD-427.69)  Her updated medication list for this problem includes:    Coumadin 7.5 Mg Tabs (Warfarin sodium) .Marland Kitchen... Take asd 1 tablet by mouth once a day    Propafenone Hcl 325 Mg Xr12h-cap (Propafenone hcl) .Marland Kitchen... 1 tab daily    Exforge 10-320 Mg Tabs (Amlodipine besylate-valsartan) .Marland Kitchen... 1tab by mouth once daily  Problem # 4:  HYPERLIPIDEMIA (ICD-272.4)  Problem # 5:  ANTICOAGULATION THERAPY (ICD-V58.61) Assessment: Unchanged  Problem # 6:  HYPERTENSION (ICD-401.9) Assessment: Unchanged  Her updated medication list for this problem includes:    Furosemide 40 Mg Tabs (Furosemide) .Marland Kitchen... Take 1 tablet by mouth once a day and as needed for excessive swelling    Exforge 10-320 Mg  Tabs (Amlodipine besylate-valsartan) .Marland Kitchen... 1tab by mouth once daily  Problem # 7:  GLUCOSE INTOLERANCE (ICD-271.3)  Problem # 8:  PULMONARY EMBOLISM, HX OF (ICD-V12.51) Assessment: Unchanged  Her updated medication list for this problem includes:    Coumadin 7.5 Mg Tabs (Warfarin sodium) .Marland Kitchen... Take asd 1 tablet by mouth once a day  Problem # 9:  ATRIAL FIBRILLATION (ICD-427.31) Assessment: Improved  Her updated medication list for this problem includes:    Coumadin 7.5 Mg Tabs (Warfarin sodium) .Marland Kitchen... Take asd 1 tablet by mouth once a day    Propafenone Hcl 325 Mg Xr12h-cap (Propafenone hcl) .Marland Kitchen... 1 tab daily  Problem # 10:  MORBID OBESITY (ICD-278.01) Assessment: Improved  Problem # 11:  DEEP VENOUS THROMBOPHLEBITIS, HX OF (ICD-V12.52) Assessment: Unchanged  Her updated medication list for this problem includes:    Coumadin 7.5 Mg Tabs (Warfarin sodium) .Marland Kitchen... Take asd 1 tablet by  mouth once a day  Patient Instructions: 1)  Your physician recommends that you schedule a follow-up appointment in: 6 MONTHS WITH DR Tyjah Hai 2)  Your physician recommends that you continue on your current medications as directed. Please refer to the Current Medication list given to you today.

## 2010-02-10 NOTE — Medication Information (Signed)
Summary: [facility]  [facility]   Imported By: Sherian Rein 09/24/2008 15:18:17  _____________________________________________________________________  External Attachment:    Type:   Image     Comment:   External Document

## 2010-02-10 NOTE — Medication Information (Signed)
Summary: CCR/AMD  Anticoagulant Therapy  Managed by: Charlena Cross, RN, BSN Referring MD: Valera Castle MD PCP: Gwen Pounds MD: Shirlee Latch MD,Ibn Stief Indication 1: Pulmonary embolism (ICD-415.19) Lab Used: [facility]  Site: [place] INR POC 4.3 INR RANGE 2 - 3  Dietary changes: no    Health status changes: no    Bleeding/hemorrhagic complications: no    Recent/future hospitalizations: no    Any changes in medication regimen? yes       Details: started on azithromycin x 4 days and prednisone injection  Recent/future dental: no  Any missed doses?: no       Is patient compliant with meds? yes       Allergies: 1)  ! Pcn  Anticoagulation Management History:      The patient is taking warfarin and comes in today for a routine follow up visit.  Negative risk factors for bleeding include an age less than 68 years old.  The bleeding index is 'low risk'.  Positive CHADS2 values include History of HTN.  Negative CHADS2 values include Age > 97 years old.  The start date was 06/13/2008.  Her last INR was 3.9 RATIO.  Anticoagulation responsible provider: Shirlee Latch MD,Aviannah Castoro.  INR POC: 4.3.    Anticoagulation Management Assessment/Plan:      The patient's current anticoagulation dose is Coumadin 7.5 mg tabs: Take asd 1 tablet by mouth once a day.  The target INR is 2 - 3.  The next INR is due 11/27/2008.  Anticoagulation instructions were given to patient.  Results were reviewed/authorized by Charlena Cross, RN, BSN.  She was notified by Charlena Cross, RN, BSN.         Prior Anticoagulation Instructions: The patient is to continue with the same dose of coumadin.  This dosage includes: coumadin 11.25 mg every day with 7.5 mg on Sun Wed Fri  Current Anticoagulation Instructions: none today, coumadin 7.5 mg tomorrow then resume coumadin 11.25 mg daily with 7.5mg  on Sun and Wed.

## 2010-02-10 NOTE — Assessment & Plan Note (Signed)
Summary: broken rib still painful, broke over a wk ago/#/cd   Vital Signs:  Patient profile:   60 year old female Height:      65 inches Weight:      266.50 pounds BMI:     44.51 O2 Sat:      96 % on Room air Temp:     98.9 degrees F oral Pulse rate:   63 / minute BP sitting:   130 / 72  (left arm) Cuff size:   large  Vitals Entered ByZella Ball Ewing (June 18, 2009 11:38 AM)  O2 Flow:  Room air CC: Rib pain, change medications/RE   Primary Care Provider:  Corwin Levins MD  CC:  Rib pain and change medications/RE.  History of Present Illness: here to f/u; fell approx one wk ago with rib fx per ER visit;  tx with pain med but still quite painful, and also diffuse other pain seems worse overall as well, especially to her arthritic joints though she has not been more active, no other falls, injuury, trauma, fever, wt loss, night sweats or other constitutional symptoms.  Reqeust pain med refill such as percocet, as well as on further hx has worsening depressive symptoms for the last few weeks, with lower mood, anhedonia, fatigue, sleeping more, but no suicidal ideation or panic.  Pt denies other CP, sob, doe, wheezing, orthopnea, pnd, worsening LE edema, palps, dizziness or syncope .  Pt denies new neuro symptoms such as headache, facial or extremity weakness  Uses home o2 , good compliance with meds.    Preventive Screening-Counseling & Management      Drug Use:  no.    Problems Prior to Update: 1)  Fracture, Rib, Right  (ICD-807.00) 2)  Sinusitis- Acute-nos  (ICD-461.9) 3)  Preventive Health Care  (ICD-V70.0) 4)  Premature Ventricular Contractions  (ICD-427.69) 5)  Sinusitis- Acute-nos  (ICD-461.9) 6)  Uti  (ICD-599.0) 7)  Dysuria  (ICD-788.1) 8)  Atrial Flutter  (ICD-427.32) 9)  Cough  (ICD-786.2) 10)  Ankle Pain  (ICD-719.47) 11)  Dependent Edema, Right Leg  (ICD-782.3) 12)  Pneumonia Organism Nos  (ICD-486) 13)  Preventive Health Care  (ICD-V70.0) 14)  Conjunctivitis   (ICD-372.30) 15)  Vitamin D Deficiency  (ICD-268.9) 16)  Preventive Health Care  (ICD-V70.0) 17)  Hyperlipidemia  (ICD-272.4) 18)  Glucose Intolerance  (ICD-271.3) 19)  Anxiety  (ICD-300.00) 20)  Pulmonary Embolism, Hx of  (ICD-V12.51) 21)  Anticoagulation Therapy  (ICD-V58.61) 22)  Atrial Fibrillation  (ICD-427.31) 23)  Chronic Rhinitis  (ICD-472.0) 24)  Edema  (ICD-782.3) 25)  Obstructive Chronic Bronchitis With Exacerbation  (ICD-491.21) 26)  Acute and Chronic Respiratory Failure  (ICD-518.84) 27)  Morbid Obesity  (ICD-278.01) 28)  Deep Venous Thrombophlebitis, Hx of  (ICD-V12.52) 29)  COPD  (ICD-496) 30)  Depression  (ICD-311) 31)  Osteoporosis  (ICD-733.00) 32)  Hypertension  (ICD-401.9)  Medications Prior to Update: 1)  Furosemide 40 Mg  Tabs (Furosemide) .... Take 1 Tablet By Mouth Once A Day and As Needed For Excessive Swelling 2)  Coumadin 7.5 Mg Tabs (Warfarin Sodium) .... Take Asd 1 Tablet By Mouth Once A Day 3)  Klor-Con 10 10 Meq Tbcr (Potassium Chloride) .... Take 2 Tabs Every Am and 1 Tab At Bedtime 4)  Fluoxetine Hcl 40 Mg Caps (Fluoxetine Hcl) .... Take 1 Tablet By Mouth Once A Day 5)  Omeprazole 20 Mg Cpdr (Omeprazole) .Marland Kitchen.. 1 By Mouth 30 Min Before 1st Meal 6)  Propafenone Hcl 325 Mg  Xr12h-Cap (Propafenone Hcl) .Marland Kitchen.. 1 Tab Daily 7)  Nasonex 50 Mcg/act Susp (Mometasone Furoate) .... 2 Puffs Daily 8)  Symbicort 160-4.5 Mcg/act  Aero (Budesonide-Formoterol Fumarate) .... Two Puffs Twice Daily 9)  Exforge 10-320 Mg  Tabs (Amlodipine Besylate-Valsartan) .Marland Kitchen.. 1tab By Mouth Once Daily 10)  Spiriva Handihaler 18 Mcg  Caps (Tiotropium Bromide Monohydrate) .... Inhale Contents of 1 Capsule Once A Day 11)  Oxygen 2l Liquid .... Continuous 12)  Glucosamine-Chondroitin   Caps (Glucosamine-Chondroit-Vit C-Mn) .... Take 1 Capsule By Mouth Once A Day 13)  Calcium Carbonate-Vitamin D 600-400 Mg-Unit  Tabs (Calcium Carbonate-Vitamin D) .... Take 1 Tablet By Mouth Two Times A Day 14)   Vitamin D 1000 Unit Tabs (Cholecalciferol) .... Take 1 Tablet By Mouth Once A Day 15)  Xopenex 1.25 Mg/84ml  Nebu (Levalbuterol Hcl) .... One Every 4 Hours As Needed 16)  Xopenex Hfa 45 Mcg/act  Aero (Levalbuterol Tartrate) .... Inhale 2 Puffs Every 4 Hours As Needed 17)  Tylenol Arthritis Pain 650 Mg Cr-Tabs (Acetaminophen) .... Per Bottle 18)  Mucinex Dm 30-600 Mg Xr12h-Tab (Dextromethorphan-Guaifenesin) .Marland Kitchen.. 1 Every 12 Hours As Needed 19)  Tramadol Hcl 50 Mg  Tabs (Tramadol Hcl) .... Take 1-2 Tablet By Mouth Every 4 Hours As  Needed For Cough 20)  Fexofenadine Hcl 180 Mg Tabs (Fexofenadine Hcl) .... Take 1 Tablet By Mouth Once A Day 21)  Cvs Saline Nasal Spray 0.65 %  Soln (Saline) .... 2 Puffs Every 4 Hours As Needed 22)  Afrin Nasal Spray 0.05 % Soln (Oxymetazoline Hcl) .Marland Kitchen.. 1 Puff Two Times A Day X5days 23)  Hydrocodone-Homatropine 5-1.5 Mg/28ml Syrp (Hydrocodone-Homatropine) .Marland Kitchen.. 1 Tsp By Mouth Q 6 Hrs As Needed Cough 24)  Doxycycline Hyclate 100 Mg Caps (Doxycycline Hyclate) .... One Twice Daily Before Meals 25)  Prednisone 10 Mg  Tabs (Prednisone) .... 4 Each Am X 2days, 2x2days, 1x2days and Stop  Current Medications (verified): 1)  Furosemide 40 Mg  Tabs (Furosemide) .... Take 1 Tablet By Mouth Once A Day and As Needed For Excessive Swelling 2)  Coumadin 7.5 Mg Tabs (Warfarin Sodium) .... Take Asd 1 Tablet By Mouth Once A Day 3)  Klor-Con 10 10 Meq Tbcr (Potassium Chloride) .... Take 2 Tabs Every Am and 1 Tab At Bedtime 4)  Fluoxetine Hcl 40 Mg Caps (Fluoxetine Hcl) .... Take 1 Tablet By Mouth Once A Day 5)  Omeprazole 20 Mg Cpdr (Omeprazole) .Marland Kitchen.. 1 By Mouth 30 Min Before 1st Meal 6)  Propafenone Hcl 325 Mg Xr12h-Cap (Propafenone Hcl) .Marland Kitchen.. 1 Tab Daily 7)  Nasonex 50 Mcg/act Susp (Mometasone Furoate) .... 2 Puffs Daily 8)  Symbicort 160-4.5 Mcg/act  Aero (Budesonide-Formoterol Fumarate) .... Two Puffs Twice Daily 9)  Exforge 10-320 Mg  Tabs (Amlodipine Besylate-Valsartan) .Marland Kitchen.. 1tab By  Mouth Once Daily 10)  Spiriva Handihaler 18 Mcg  Caps (Tiotropium Bromide Monohydrate) .... Inhale Contents of 1 Capsule Once A Day 11)  Oxygen 2l Liquid .... Continuous 12)  Glucosamine-Chondroitin   Caps (Glucosamine-Chondroit-Vit C-Mn) .... Take 1 Capsule By Mouth Once A Day 13)  Calcium Carbonate-Vitamin D 600-400 Mg-Unit  Tabs (Calcium Carbonate-Vitamin D) .... Take 1 Tablet By Mouth Two Times A Day 14)  Vitamin D 1000 Unit Tabs (Cholecalciferol) .... Take 1 Tablet By Mouth Once A Day 15)  Xopenex 1.25 Mg/16ml  Nebu (Levalbuterol Hcl) .... One Every 4 Hours As Needed 16)  Xopenex Hfa 45 Mcg/act  Aero (Levalbuterol Tartrate) .... Inhale 2 Puffs Every 4 Hours As Needed 17)  Tylenol Arthritis  Pain 650 Mg Cr-Tabs (Acetaminophen) .... Per Bottle 18)  Mucinex Dm 30-600 Mg Xr12h-Tab (Dextromethorphan-Guaifenesin) .Marland Kitchen.. 1 Every 12 Hours As Needed 19)  Tramadol Hcl 50 Mg  Tabs (Tramadol Hcl) .... Take 1-2 Tablet By Mouth Every 4 Hours As  Needed For Cough 20)  Fexofenadine Hcl 180 Mg Tabs (Fexofenadine Hcl) .... Take 1 Tablet By Mouth Once A Day 21)  Cvs Saline Nasal Spray 0.65 %  Soln (Saline) .... 2 Puffs Every 4 Hours As Needed 22)  Afrin Nasal Spray 0.05 % Soln (Oxymetazoline Hcl) .Marland Kitchen.. 1 Puff Two Times A Day X5days 23)  Hydrocodone-Acetaminophen 7.5-325 Mg Tabs (Hydrocodone-Acetaminophen) .Marland Kitchen.. 1 By Mouth Q 6 Hrs As Needed Pain 24)  Cymbalta 60 Mg Cpep (Duloxetine Hcl) .Marland Kitchen.. 1 By Mouth Once Daily  Allergies (verified): 1)  ! Pcn  Past History:  Past Medical History: Last updated: 04/18/2009 CHRONIC RHINITIS (ICD-472.0) EDEMA (ICD-782.3) ACUTE AND CHRONIC RESPIRATORY FAILURE (ICD-518.84) MORBID OBESITY (ICD-278.01)     - Target wt  =   179  for BMI < 30 peak wt 282     - Referred back to nutrition December 18, 2007  DEEP VENOUS THROMBOPHLEBITIS, HX OF (ICD-V12.52) COPD (ICD-496)........................................................Marland KitchenWert     - Pft's 07/25/06 FEV1 55% ratio 53, DLC0  51% DEPRESSION (ICD-311) OSTEOPOROSIS (ICD-733.00) last BMD 4/08 -2.4, intolerant of bisphosphonates  HYPERTENSION (ICD-401.9) Atrial fibrillation - paroxysmal Anticoagulation therapy Pulmonary embolism, hx of neg stress myoview 10/07 Anxiety glucose intolerance - on steroids Hyperlipidemia Complex medical regimen    - Med calendar done 05/01/2008  Past Surgical History: Last updated: 04/15/2008 Hysterectomy Tonsillectomy Skin Graft to midde R finger-1975 s/p left arm fracture with fall off chair  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   Head:  normocephalic and atraumatic.   Eyes:  vision grossly intact, pupils equal, and pupils round.   Ears:  R ear normal and L ear normal.   Nose:  no external deformity and no nasal discharge.   Mouth:  good dentition and pharynx pink and moist.   Neck:  supple and no masses.   Lungs:  normal respiratory effort, R decreased breath sounds, and L decreased breath sounds.   Heart:  normal rate and regular rhythm.   Msk:  tender over rib fx area Extremities:  no edema, no erythema  Neurologic:  cranial nerves II-XII intact and strength normal in all extremities.   Skin:  color normal and no rashes.   Psych:  depressed affect and moderately anxious.     Impression & Recommendations:  Problem # 1:  FRACTURE, RIB, RIGHT (ICD-807.00) for pain control - see emr  Problem # 2:  DEPRESSION (ICD-311)  The following medications were removed from the  medication list:    Fluoxetine Hcl 40 Mg Caps (Fluoxetine hcl) .Marland Kitchen... Take 1 tablet by mouth once a day Her updated medication list for this problem includes:    Cymbalta 60 Mg Cpep (Duloxetine hcl) .Marland Kitchen... 1 by mouth once daily treat as above, f/u any worsening signs  or symptoms   Problem # 3:  COPD (ICD-496)  Her updated medication list for this problem includes:    Symbicort 160-4.5 Mcg/act Aero (Budesonide-formoterol fumarate) .Marland Kitchen..Marland Kitchen Two puffs twice daily    Spiriva Handihaler 18 Mcg Caps (Tiotropium bromide monohydrate) ..... Inhale contents of 1 capsule once a day    Xopenex 1.25 Mg/88ml Nebu (Levalbuterol hcl) ..... One every 4 hours as needed    Xopenex Hfa 45 Mcg/act Aero (Levalbuterol tartrate) ..... Inhale 2 puffs every 4 hours as needed stable overall by hx and exam, ok to continue meds/tx as is   Problem # 4:  HYPERTENSION (ICD-401.9)  Her updated medication list for this problem includes:    Furosemide 40 Mg Tabs (Furosemide) .Marland Kitchen... Take 1 tablet by mouth once a day and as needed for excessive swelling    Exforge 10-320 Mg Tabs (Amlodipine besylate-valsartan) .Marland Kitchen... 1tab by mouth once daily  BP today: 130/72 Prior BP: 148/90 (04/22/2009)  Labs Reviewed: K+: 5.0 (10/23/2008) Creat: : 0.87 (10/23/2008)   Chol: 179 (10/14/2008)   HDL: 35.60 (10/14/2008)   LDL: 117 (10/14/2008)   TG: 132.0 (10/14/2008) stable overall by hx and exam, ok to continue meds/tx as is   Complete Medication List: 1)  Furosemide 40 Mg Tabs (Furosemide) .... Take 1 tablet by mouth once a day and as needed for excessive swelling 2)  Coumadin 7.5 Mg Tabs (Warfarin sodium) .... Take asd 1 tablet by mouth once a day 3)  Klor-con 10 10 Meq Tbcr (Potassium chloride) .... Take 2 tabs every am and 1 tab at bedtime 4)  Omeprazole 20 Mg Cpdr (Omeprazole) .Marland Kitchen.. 1 by mouth 30 min before 1st meal 5)  Propafenone Hcl 325 Mg Xr12h-cap (Propafenone hcl) .Marland Kitchen.. 1 tab daily 6)  Nasonex 50 Mcg/act Susp (Mometasone furoate)  .... 2 puffs daily 7)  Symbicort 160-4.5 Mcg/act Aero (Budesonide-formoterol fumarate) .... Two puffs twice daily 8)  Exforge 10-320 Mg Tabs (Amlodipine besylate-valsartan) .Marland Kitchen.. 1tab by mouth once daily 9)  Spiriva Handihaler 18 Mcg Caps (Tiotropium bromide monohydrate) .... Inhale contents of 1 capsule once a day 10)  Oxygen 2l Liquid  .... Continuous 11)  Glucosamine-chondroitin Caps (Glucosamine-chondroit-vit c-mn) .... Take 1 capsule by mouth once a day 12)  Calcium Carbonate-vitamin D 600-400 Mg-unit Tabs (Calcium carbonate-vitamin d) .... Take 1 tablet by mouth two times a day 13)  Vitamin D 1000 Unit Tabs (Cholecalciferol) .... Take 1 tablet by mouth once a day 14)  Xopenex 1.25 Mg/91ml Nebu (Levalbuterol hcl) .... One every 4 hours as needed 15)  Xopenex Hfa 45 Mcg/act Aero (Levalbuterol tartrate) .... Inhale 2 puffs every 4 hours as needed 16)  Tylenol Arthritis Pain 650 Mg Cr-tabs (Acetaminophen) .... Per bottle 17)  Mucinex Dm 30-600 Mg Xr12h-tab (Dextromethorphan-guaifenesin) .Marland Kitchen.. 1 every 12 hours as needed 18)  Tramadol Hcl 50 Mg Tabs (Tramadol hcl) .... Take 1-2 tablet by mouth every 4 hours as  needed for cough 19)  Fexofenadine Hcl 180 Mg Tabs (Fexofenadine hcl) .... Take 1 tablet by mouth once a day 20)  Cvs Saline Nasal Spray 0.65 % Soln (Saline) .... 2 puffs every 4 hours as needed 21)  Afrin Nasal Spray 0.05 % Soln (Oxymetazoline hcl) .Marland Kitchen.. 1 puff two times a day x5days 22)  Hydrocodone-acetaminophen 7.5-325 Mg Tabs (Hydrocodone-acetaminophen) .Marland Kitchen.. 1 by mouth q 6 hrs as needed pain 23)  Cymbalta 60 Mg Cpep (Duloxetine hcl) .Marland Kitchen.. 1 by mouth once daily  Patient Instructions: 1)  Please take all new medications as prescribed -  the cymbalta is started at 30 mg per day for one wk, then 60 mg per day after that 2)  Continue all previous medications as before this visit , except stop the fluoxetine 3)  Please schedule a follow-up appointment in 4 months with CPX labs, or sooner if  needed Prescriptions: CYMBALTA 60 MG CPEP (DULOXETINE HCL) 1 by mouth once daily  #30 x 11   Entered and Authorized by:   Corwin Levins MD   Signed by:   Corwin Levins MD on 06/21/2009   Method used:   Electronically to        [facility]  [address] [phone]* (retail)       [address]       [place], [place]  [id number]       Ph: [phone] or [phone]       Fax: [phone]   RxID:   [id number] HYDROCODONE-ACETAMINOPHEN 7.5-325 MG TABS (HYDROCODONE-ACETAMINOPHEN) 1 by mouth q 6 hrs as needed pain  #60 x 1   Entered and Authorized by:   Corwin Levins MD   Signed by:   Corwin Levins MD on 06/18/2009   Method used:   Print then Give to Patient   RxID:   [id number]

## 2010-02-12 NOTE — Medication Information (Signed)
Summary: rov/ewj  Anticoagulant Therapy  Managed by: Cloyde Reams, RN, BSN Referring MD: Valera Castle MD PCP: Corwin Levins MD Supervising MD: Mariah Milling Indication 1: Pulmonary embolism (ICD-415.19) Lab Used: [facility]  Site: [place] INR POC 3.6 INR RANGE 2 - 3  Dietary changes: no    Health status changes: no    Bleeding/hemorrhagic complications: no    Recent/future hospitalizations: no    Any changes in medication regimen? yes       Details: Off all abx, feeling better.   Recent/future dental: no  Any missed doses?: no       Is patient compliant with meds? yes       Allergies: 1)  ! Pcn 2)  * Cymbalta   Anticoagulation Management History:      The patient is taking warfarin and comes in today for a routine follow up visit.  Negative risk factors for bleeding include an age less than 12 years old.  The bleeding index is 'low risk'.  Positive CHADS2 values include History of HTN.  Negative CHADS2 values include Age > 68 years old.  The start date was 06/13/2008.  Her last INR was 5.6.  Anticoagulation responsible provider: gollan.  INR POC: 3.6.  Cuvette Lot#: [id number].  Exp: 12/2010.    Anticoagulation Management Assessment/Plan:      The patient's current anticoagulation dose is Coumadin 7.5 mg tabs: Take as directed by coumadin clinic..  The target INR is 2 - 3.  The next INR is due 01/21/2010.  Anticoagulation instructions were given to patient.  Results were reviewed/authorized by Cloyde Reams, RN, BSN.  She was notified by Cloyde Reams RN.         Prior Anticoagulation Instructions: INR 3.8  Skip today's dosage then resume same dosage 1 tablet daily except 1.5 tablets on Tuesdays, Thursdays, and Saturdays.  Recheck in 2 weeks.    Current Anticoagulation Instructions: INR 3.6  Skip today's dosage of Coumadin, Start taking 1 tablet daily except 1.5 tablets on Tuesdays and Saturdays.  Recheck in 2 weeks.    Prescriptions: COUMADIN 7.5 MG TABS (WARFARIN SODIUM) Take as directed by coumadin clinic.  #110 x 3   Entered by:   Cloyde Reams RN   Authorized by:   Gaylord Shih, MD, [facility]   Signed by:   Cloyde Reams RN on 01/07/2010   Method used:   Faxed to ...       [facility] (mail-order)             , [place]         Ph: [phone]       Fax: [phone]   RxID:   [id number]

## 2010-02-12 NOTE — Medication Information (Signed)
Summary: rov/tm  Anticoagulant Therapy  Managed by: Cloyde Reams, RN, BSN Referring MD: Valera Castle MD PCP: Corwin Levins MD Supervising MD: Mariah Milling Indication 1: Pulmonary embolism (ICD-415.19) Lab Used: [facility] [facility]: [facility] INR POC 3.8 INR RANGE 2 - 3  Dietary changes: yes       Details: Nauseated from abx, decr appetite.  Health status changes: no    Bleeding/hemorrhagic complications: no    Recent/future hospitalizations: no    Any changes in medication regimen? yes       Details: Started on Levofloxin 750mg   daily x 5 days started on 12/18/09.    Any missed doses?: yes     Details: Missed 1 dosage of Coumadin, for DDS extraction, but had to r/s appt.   Is patient compliant with meds? yes       Allergies: 1)  ! Pcn 2)  * Cymbalta  Anticoagulation Management History:      The patient is taking warfarin and comes in today for a routine follow up visit.  Negative risk factors for bleeding include an age less than 28 years old.  The bleeding index is 'low risk'.  Positive CHADS2 values include History of HTN.  Negative CHADS2 values include Age > 52 years old.  The start date was 06/13/2008.  Her last INR was 5.6.  Anticoagulation responsible provider: Delmus Warwick.  INR POC: 3.8.  Cuvette Lot#: [id number].  Exp: 12/2010.    Anticoagulation Management Assessment/Plan:      The patient's current anticoagulation dose is Coumadin 7.5 mg tabs: Take asd 1 tablet by mouth once a day.  The target INR is 2 - 3.  The next INR is due 01/07/2010.  Anticoagulation instructions were given to patient.  Results were reviewed/authorized by Cloyde Reams, RN, BSN.  She was notified by Cloyde Reams RN.         Prior Anticoagulation Instructions: INR 3.2 Skip today's dose then resume 1 pill everyday except 1 1/2 pills on Tuesdays, Thursdays and Saturdays. Recheck in 3 weeks.   Current Anticoagulation Instructions: INR 3.8  Skip today's dosage then resume  same dosage 1 tablet daily except 1.5 tablets on Tuesdays, Thursdays, and Saturdays.  Recheck in 2 weeks.

## 2010-02-12 NOTE — Medication Information (Signed)
Summary: rov/ewj  Anticoagulant Therapy  Managed by: Cloyde Reams, RN, BSN Referring MD: Valera Castle MD PCP: Corwin Levins MD Supervising MD: Mariah Milling Indication 1: Pulmonary embolism (ICD-415.19) Lab Used: [facility] Site: [place] INR POC 2.1 INR RANGE 2 - 3  Dietary changes: yes       Details: Incr salad intake.  Health status changes: no    Bleeding/hemorrhagic complications: no    Recent/future hospitalizations: no    Any changes in medication regimen? no    Recent/future dental: no  Any missed doses?: no       Is patient compliant with meds? yes       Allergies: 1)  ! Pcn 2)  * Cymbalta  Anticoagulation Management History:      The patient is taking warfarin and comes in today for a routine follow up visit.  Negative risk factors for bleeding include an age less than 50 years old.  The bleeding index is 'low risk'.  Positive CHADS2 values include History of HTN.  Negative CHADS2 values include Age > 71 years old.  The start date was 06/13/2008.  Her last INR was 5.6.  Anticoagulation responsible provider: Jaequan Propes.  INR POC: 2.1.  Cuvette Lot#: [id number].  Exp: 02/2011.    Anticoagulation Management Assessment/Plan:      The patient's current anticoagulation dose is Coumadin 7.5 mg tabs: Take as directed by coumadin clinic..  The target INR is 2 - 3.  The next INR is due 02/18/2010.  Anticoagulation instructions were given to patient.  Results were reviewed/authorized by Cloyde Reams, RN, BSN.  She was notified by Cloyde Reams RN.         Prior Anticoagulation Instructions: INR 3.6  Skip today's dosage of Coumadin, Start taking 1 tablet daily except 1.5 tablets on Tuesdays and Saturdays.  Recheck in 2 weeks.    Current Anticoagulation Instructions: INR 2.1  Continue on same dosage 1 tablet daily except 1.5 tablets on Tuesdays and Saturdays.  Recheck in 4 weeks.

## 2010-02-18 ENCOUNTER — Encounter (INDEPENDENT_AMBULATORY_CARE_PROVIDER_SITE_OTHER): Payer: BC Managed Care – PPO

## 2010-02-18 ENCOUNTER — Encounter: Payer: Self-pay | Admitting: Cardiovascular Disease

## 2010-02-18 DIAGNOSIS — Z7901 Long term (current) use of anticoagulants: Secondary | ICD-10-CM

## 2010-02-18 DIAGNOSIS — I2699 Other pulmonary embolism without acute cor pulmonale: Secondary | ICD-10-CM

## 2010-02-18 LAB — CONVERTED CEMR LAB: POC INR: 2.4

## 2010-02-26 NOTE — Medication Information (Signed)
Summary: Coumadin Clinic  Anticoagulant Therapy  Managed by: Cloyde Reams, RN, BSN Referring MD: Valera Castle MD PCP: Corwin Levins MD Supervising MD: Mariah Milling Indication 1: Pulmonary embolism (ICD-415.19) Lab Used: [facility] Site: [place] INR POC 2.4 INR RANGE 2 - 3  Dietary changes: no    Health status changes: no    Bleeding/hemorrhagic complications: no    Recent/future hospitalizations: no    Any changes in medication regimen? no    Recent/future dental: no  Any missed doses?: no       Is patient compliant with meds? yes       Allergies: 1)  ! Pcn 2)  * Cymbalta  Anticoagulation Management History:      The patient is taking warfarin and comes in today for a routine follow up visit.  Negative risk factors for bleeding include an age less than 42 years old.  The bleeding index is 'low risk'.  Positive CHADS2 values include History of HTN.  Negative CHADS2 values include Age > 93 years old.  The start date was 06/13/2008.  Her last INR was 5.6.  Anticoagulation responsible provider: Lanetta Figuero.  INR POC: 2.4.  Cuvette Lot#: [id number].  Exp: 02/2011.    Anticoagulation Management Assessment/Plan:      The patient's current anticoagulation dose is Coumadin 7.5 mg tabs: Take as directed by coumadin clinic..  The target INR is 2 - 3.  The next INR is due 03/18/2010.  Anticoagulation instructions were given to patient.  Results were reviewed/authorized by Cloyde Reams, RN, BSN.  She was notified by Cloyde Reams RN.         Prior Anticoagulation Instructions: INR 2.1  Continue on same dosage 1 tablet daily except 1.5 tablets on Tuesdays and Saturdays.  Recheck in 4 weeks.   Current Anticoagulation Instructions: INR 2.4  Continue on same dosage 1 tablet daily except 1.5 tablets on Tuesdays and Saturdays.  Recheck in 4 weeks.

## 2010-02-28 ENCOUNTER — Encounter: Payer: Self-pay | Admitting: Family Medicine

## 2010-02-28 ENCOUNTER — Ambulatory Visit (INDEPENDENT_AMBULATORY_CARE_PROVIDER_SITE_OTHER): Payer: BC Managed Care – PPO | Admitting: Family Medicine

## 2010-02-28 DIAGNOSIS — M25579 Pain in unspecified ankle and joints of unspecified foot: Secondary | ICD-10-CM

## 2010-03-04 NOTE — Assessment & Plan Note (Signed)
Summary: right ankle pain/[name] [name]   Vital Signs:  Patient profile:   60 year old female Weight:      277 pounds BMI:     46.26 O2 Sat:      94 % on 2 L/min Temp:     98.2 degrees F oral Pulse rate:   68 / minute BP sitting:   138 / 80  (left arm) Cuff size:   large  Vitals Entered By: Lamar Sprinkles, CMA (February 28, 2010 10:05 AM)  O2 Flow:  2 L/min CC: Right ankle pain - burning off and on, no injuries recently/SD   History of Present Illness: 60 yo woman here today for R ankle pain.  sxs started 2 days ago.  pain is lateral and inferior to malleolus.  described as a 'burning pain'.  intermittant.  no hx of gout.  denies excessive edema.  no relief w/ tylenol.  on coumadin- unable to take NSAIDs.  no hx of similar.  has recently increased amount of activity.  no known injury.  no bruising.  Current Medications (verified): 1)  Furosemide 40 Mg  Tabs (Furosemide) .... Take 1 Tablet By Mouth Once A Day and 1 Extra Once Daily As Needed 2)  Coumadin 7.5 Mg Tabs (Warfarin Sodium) .... Take As Directed By [facility]madin Clinic. 3)  Klor-Con 10 10 Meq Tbcr (Potassium Chloride) .... Take 1 Tablet By Mouth Two Times A Day 4)  Fluoxetine Hcl 40 Mg Caps (Fluoxetine Hcl) .Marland Kitchen.. 1 Once Daily 5)  Omeprazole 20 Mg Cpdr (Omeprazole) .Marland Kitchen.. 1 By Mouth 30 Min Before 1st Meal 6)  Propafenone Hcl 325 Mg Xr12h-Cap (Propafenone Hcl) .... 2 Tab Daily 7)  Nasonex 50 Mcg/act Susp (Mometasone Furoate) .... 2 Puffs Each Nostril Two Times A Day 8)  Symbicort 160-4.5 Mcg/act  Aero (Budesonide-Formoterol Fumarate) .... Two Puffs Twice Daily 9)  Exforge 10-320 Mg  Tabs (Amlodipine Besylate-Valsartan) .Marland Kitchen.. 1tab By Mouth Once Daily 10)  Spiriva Handihaler 18 Mcg  Caps (Tiotropium Bromide Monohydrate) .... Inhale Contents of 1 Capsule Once A Day-Stopped Taking 11)  Oxygen 2l/min .... Continuous 12)  Glucosamine-Chondroitin   Caps (Glucosamine-Chondroit-Vit C-Mn) .... Take 1 Capsule By Mouth Once A Day 13)  Calcium  Carbonate-Vitamin D 600-400 Mg-Unit  Tabs (Calcium Carbonate-Vitamin D) .... Take 1 Tablet By Mouth Two Times A Day 14)  Vitamin D 1000 Unit Tabs (Cholecalciferol) .... Take 1 Tablet By Mouth Once A Day 15)  Xopenex 1.25 Mg/36ml  Nebu (Levalbuterol Hcl) .... One Every 4 Hours As Needed 16)  Xopenex Hfa 45 Mcg/act  Aero (Levalbuterol Tartrate) .... Inhale 2 Puffs Every 4 Hours As Needed 17)  Tylenol Arthritis Pain 650 Mg Cr-Tabs (Acetaminophen) .... Per Bottle 18)  Mucinex Dm 30-600 Mg Xr12h-Tab (Dextromethorphan-Guaifenesin) .Marland Kitchen.. 1 Every 12 Hours As Needed W/ Flutter 19)  Tramadol Hcl 50 Mg  Tabs (Tramadol Hcl) .... Take 1 Tablet By Mouth Every 4 Hours As  Needed For Cough 20)  Chlor-Trimeton 4 Mg Tabs (Chlorpheniramine Maleate) .Marland Kitchen.. 1 Every 4 Hours As Needed 21)  Cvs Saline Nasal Spray 0.65 %  Soln (Saline) .... 2 Puffs Every 4 Hours As Needed 22)  Afrin Nasal Spray 0.05 % Soln (Oxymetazoline Hcl) .Marland Kitchen.. 1 Puff Two Times A Day X5days - Before Nasonex 23)  Prolia 60 Mg/ml Soln (Denosumab) .... Inject Once Every 6 Months 24)  Levaquin 750 Mg  Tabs (Levofloxacin) .... One Tablet By Mouth Daily X 5 Days  Allergies (verified): 1)  ! Pcn 2)  * Cymbalta  Review of Systems      See HPI  Physical Exam  General:  alert and overweight-appearing.  Msk:  no TTP over R lateral malleolus, metatarsals no pain w/ ankle dorsi or plantarflexion, inversion/eversion Pulses:  +2 DP/PT Extremities:  no edema, no erythema  Neurologic:  strength normal in all extremities and sensation intact to light touch.     Impression & Recommendations:  Problem # 1:  ANKLE PAIN (ICD-719.47) Assessment Unchanged pt's pain described as a burning pain- suggestive of nerve involvement.  no edema, erythema, induration, bruising.  no pain w/ palpation or motion.  continue tylenol, ice, elevation.  encouraged pt to remain active.  she is to journal her sxs and f/u w/ PCP.  Pt expresses understanding and is in agreement w/ this  plan.  Complete Medication List: 1)  Furosemide 40 Mg Tabs (Furosemide) .... Take 1 tablet by mouth once a day and 1 extra once daily as needed 2)  Coumadin 7.5 Mg Tabs (Warfarin sodium) .... Take as directed by coumadin clinic. 3)  Klor-con 10 10 Meq Tbcr (Potassium chloride) .... Take 1 tablet by mouth two times a day 4)  Fluoxetine Hcl 40 Mg Caps (Fluoxetine hcl) .Marland Kitchen.. 1 once daily 5)  Omeprazole 20 Mg Cpdr (Omeprazole) .Marland Kitchen.. 1 by mouth 30 min before 1st meal 6)  Propafenone Hcl 325 Mg Xr12h-cap (Propafenone hcl) .... 2 tab daily 7)  Nasonex 50 Mcg/act Susp (Mometasone furoate) .... 2 puffs each nostril two times a day 8)  Symbicort 160-4.5 Mcg/act Aero (Budesonide-formoterol fumarate) .... Two puffs twice daily 9)  Exforge 10-320 Mg Tabs (Amlodipine besylate-valsartan) .Marland Kitchen.. 1tab by mouth once daily 10)  Spiriva Handihaler 18 Mcg Caps (Tiotropium bromide monohydrate) .... Inhale contents of 1 capsule once a day-stopped taking 11)  Oxygen 2l/min  .... Continuous 12)  Glucosamine-chondroitin Caps (Glucosamine-chondroit-vit c-mn) .... Take 1 capsule by mouth once a day 13)  Calcium Carbonate-vitamin D 600-400 Mg-unit Tabs (Calcium carbonate-vitamin d) .... Take 1 tablet by mouth two times a day 14)  Vitamin D 1000 Unit Tabs (Cholecalciferol) .... Take 1 tablet by mouth once a day 15)  Xopenex 1.25 Mg/40ml Nebu (Levalbuterol hcl) .... One every 4 hours as needed 16)  Xopenex Hfa 45 Mcg/act Aero (Levalbuterol tartrate) .... Inhale 2 puffs every 4 hours as needed 17)  Tylenol Arthritis Pain 650 Mg Cr-tabs (Acetaminophen) .... Per bottle 18)  Mucinex Dm 30-600 Mg Xr12h-tab (Dextromethorphan-guaifenesin) .Marland Kitchen.. 1 every 12 hours as needed w/ flutter 19)  Tramadol Hcl 50 Mg Tabs (Tramadol hcl) .... Take 1 tablet by mouth every 4 hours as  needed for cough 20)  Chlor-trimeton 4 Mg Tabs (Chlorpheniramine maleate) .Marland Kitchen.. 1 every 4 hours as needed 21)  Cvs Saline Nasal Spray 0.65 % Soln (Saline) .... 2 puffs  every 4 hours as needed 22)  Afrin Nasal Spray 0.05 % Soln (Oxymetazoline hcl) .Marland Kitchen.. 1 puff two times a day x5days - before nasonex 23)  Prolia 60 Mg/ml Soln (Denosumab) .... Inject once every 6 months 24)  Levaquin 750 Mg Tabs (Levofloxacin) .... One tablet by mouth daily x 5 days  Patient Instructions: 1)  Follow up with your doctor in 7- 10 days 2)  Your pain appears to be nerve related 3)  Keep a journal of your symptoms- when it starts, how long it lasts, what makes it better or worse 4)  Continue the tylenol, elevate your leg, ice 5)  Call with any questions or concerns 6)  Hang in there!  Orders Added: 1)  Est. Patient Level III [16109]    Prevention & Chronic Care Immunizations   Influenza vaccine: Fluvax 3+  (10/16/2009)    Tetanus booster: 12/05/2007: Tdap    Pneumococcal vaccine: given  (09/12/2006)   Pneumococcal vaccine due: 09/2011  Colorectal Screening   Hemoccult: Not documented    Colonoscopy: Done  (02/13/2004)   Colonoscopy due: 02/2014  Other Screening   Pap smear: normal per Dr Jackelyn Knife  (03/13/2007)    Mammogram: normal  (01/12/2008)   Smoking status: quit  (02/07/2008)  Lipids   Total Cholesterol: 197  (10/16/2009)   LDL: 132  (10/16/2009)   LDL Direct: 130.7  (03/17/2006)   HDL: 42.70  (10/16/2009)   Triglycerides: 111.0  (10/16/2009)    SGOT (AST): 21  (10/16/2009)   SGPT (ALT): 19  (10/16/2009)   Alkaline phosphatase: 81  (10/16/2009)   Total bilirubin: 1.1  (10/16/2009)  Hypertension   Last Blood Pressure: 138 / 80  (02/28/2010)   Serum creatinine: 0.9  (10/16/2009)   Serum potassium 4.1  (10/16/2009)  Self-Management Support :    Hypertension self-management support: Not documented    Lipid self-management support: Not documented

## 2010-03-05 ENCOUNTER — Ambulatory Visit (INDEPENDENT_AMBULATORY_CARE_PROVIDER_SITE_OTHER): Payer: BC Managed Care – PPO | Admitting: Adult Health

## 2010-03-05 ENCOUNTER — Other Ambulatory Visit: Payer: Self-pay | Admitting: Adult Health

## 2010-03-05 ENCOUNTER — Other Ambulatory Visit: Payer: BC Managed Care – PPO

## 2010-03-05 ENCOUNTER — Encounter: Payer: Self-pay | Admitting: Adult Health

## 2010-03-05 DIAGNOSIS — R609 Edema, unspecified: Secondary | ICD-10-CM

## 2010-03-05 DIAGNOSIS — J449 Chronic obstructive pulmonary disease, unspecified: Secondary | ICD-10-CM

## 2010-03-05 LAB — BASIC METABOLIC PANEL
BUN: 12 mg/dL (ref 6–23)
CO2: 33 mEq/L — ABNORMAL HIGH (ref 19–32)
Calcium: 8.6 mg/dL (ref 8.4–10.5)
Chloride: 105 mEq/L (ref 96–112)
Creatinine, Ser: 0.9 mg/dL (ref 0.4–1.2)
GFR: 67.94 mL/min (ref >60.00)
Glucose, Bld: 100 mg/dL — ABNORMAL HIGH (ref 70–99)
Potassium: 3.9 mEq/L (ref 3.5–5.1)
Sodium: 142 mEq/L (ref 135–145)

## 2010-03-05 LAB — BRAIN NATRIURETIC PEPTIDE: Pro B Natriuretic peptide (BNP): 292.4 pg/mL — ABNORMAL HIGH (ref 0.0–100.0)

## 2010-03-07 ENCOUNTER — Encounter: Payer: Self-pay | Admitting: Cardiovascular Disease

## 2010-03-09 ENCOUNTER — Telehealth (INDEPENDENT_AMBULATORY_CARE_PROVIDER_SITE_OTHER): Payer: Self-pay | Admitting: *Deleted

## 2010-03-09 ENCOUNTER — Telehealth: Payer: Self-pay | Admitting: Internal Medicine

## 2010-03-10 ENCOUNTER — Encounter: Payer: Self-pay | Admitting: Cardiology

## 2010-03-10 ENCOUNTER — Encounter: Payer: Self-pay | Admitting: Internal Medicine

## 2010-03-10 ENCOUNTER — Ambulatory Visit (INDEPENDENT_AMBULATORY_CARE_PROVIDER_SITE_OTHER): Payer: BC Managed Care – PPO | Admitting: Internal Medicine

## 2010-03-10 ENCOUNTER — Inpatient Hospital Stay (HOSPITAL_COMMUNITY): Payer: BC Managed Care – PPO

## 2010-03-10 ENCOUNTER — Inpatient Hospital Stay (HOSPITAL_COMMUNITY)
Admission: AD | Admit: 2010-03-10 | Discharge: 2010-03-16 | DRG: 544 | Disposition: A | Discharge status: Home / Self Care | Payer: BC Managed Care – PPO | Source: Ambulatory Visit | Attending: Internal Medicine | Admitting: Internal Medicine

## 2010-03-10 DIAGNOSIS — J449 Chronic obstructive pulmonary disease, unspecified: Secondary | ICD-10-CM | POA: Diagnosis present

## 2010-03-10 DIAGNOSIS — J4489 Other specified chronic obstructive pulmonary disease: Secondary | ICD-10-CM | POA: Diagnosis present

## 2010-03-10 DIAGNOSIS — Z86718 Personal history of other venous thrombosis and embolism: Secondary | ICD-10-CM

## 2010-03-10 DIAGNOSIS — R0989 Other specified symptoms and signs involving the circulatory and respiratory systems: Secondary | ICD-10-CM

## 2010-03-10 DIAGNOSIS — J9620 Acute and chronic respiratory failure, unspecified whether with hypoxia or hypercapnia: Secondary | ICD-10-CM | POA: Diagnosis present

## 2010-03-10 DIAGNOSIS — I4949 Other premature depolarization: Secondary | ICD-10-CM

## 2010-03-10 DIAGNOSIS — I4891 Unspecified atrial fibrillation: Secondary | ICD-10-CM | POA: Diagnosis present

## 2010-03-10 DIAGNOSIS — I4892 Unspecified atrial flutter: Secondary | ICD-10-CM | POA: Diagnosis present

## 2010-03-10 DIAGNOSIS — R0609 Other forms of dyspnea: Secondary | ICD-10-CM | POA: Insufficient documentation

## 2010-03-10 DIAGNOSIS — I498 Other specified cardiac arrhythmias: Secondary | ICD-10-CM | POA: Diagnosis present

## 2010-03-10 DIAGNOSIS — E662 Morbid (severe) obesity with alveolar hypoventilation: Secondary | ICD-10-CM | POA: Diagnosis present

## 2010-03-10 DIAGNOSIS — J329 Chronic sinusitis, unspecified: Secondary | ICD-10-CM | POA: Diagnosis present

## 2010-03-10 DIAGNOSIS — I509 Heart failure, unspecified: Secondary | ICD-10-CM

## 2010-03-10 DIAGNOSIS — Z79899 Other long term (current) drug therapy: Secondary | ICD-10-CM

## 2010-03-10 DIAGNOSIS — R609 Edema, unspecified: Secondary | ICD-10-CM

## 2010-03-10 DIAGNOSIS — R5381 Other malaise: Secondary | ICD-10-CM | POA: Diagnosis present

## 2010-03-10 DIAGNOSIS — Z86711 Personal history of pulmonary embolism: Secondary | ICD-10-CM

## 2010-03-10 DIAGNOSIS — I5033 Acute on chronic diastolic (congestive) heart failure: Principal | ICD-10-CM | POA: Diagnosis present

## 2010-03-10 DIAGNOSIS — Z7901 Long term (current) use of anticoagulants: Secondary | ICD-10-CM

## 2010-03-10 DIAGNOSIS — R0602 Shortness of breath: Secondary | ICD-10-CM

## 2010-03-10 LAB — COMPREHENSIVE METABOLIC PANEL
ALT: 19 U/L (ref 0–35)
AST: 16 U/L (ref 0–37)
Albumin: 3.6 g/dL (ref 3.5–5.2)
Alkaline Phosphatase: 46 U/L (ref 39–117)
BUN: 13 mg/dL (ref 6–23)
CO2: 30 mEq/L (ref 19–32)
Calcium: 8.6 mg/dL (ref 8.4–10.5)
Chloride: 104 mEq/L (ref 96–112)
Creatinine, Ser: 0.93 mg/dL (ref 0.4–1.2)
GFR calc Af Amer: >60 mL/min (ref >60)
GFR calc non Af Amer: >60 mL/min (ref >60)
Glucose, Bld: 87 mg/dL (ref 70–99)
Potassium: 3.3 mEq/L — ABNORMAL LOW (ref 3.5–5.1)
Sodium: 144 mEq/L (ref 135–145)
Total Bilirubin: 1 mg/dL (ref 0.3–1.2)
Total Protein: 6.1 g/dL (ref 6.0–8.3)

## 2010-03-10 LAB — CBC
HCT: 35.9 % — ABNORMAL LOW (ref 36.0–46.0)
Hemoglobin: 11.6 g/dL — ABNORMAL LOW (ref 12.0–15.0)
MCH: 28.4 pg (ref 26.0–34.0)
MCHC: 32.3 g/dL (ref 30.0–36.0)
MCV: 88 fL (ref 78.0–100.0)
Platelets: 237 10*3/uL (ref 150–400)
RBC: 4.08 MIL/uL (ref 3.87–5.11)
RDW: 15.3 % (ref 11.5–15.5)
WBC: 9.1 10*3/uL (ref 4.0–10.5)

## 2010-03-10 LAB — CARDIAC PANEL(CRET KIN+CKTOT+MB+TROPI)
CK, MB: 1.6 ng/mL (ref 0.3–4.0)
CK, MB: 1.5 ng/mL (ref 0.3–4.0)
Relative Index: INVALID (ref 0.0–2.5)
Relative Index: INVALID (ref 0.0–2.5)
Total CK: 61 U/L (ref 7–177)
Total CK: 55 U/L (ref 7–177)
Troponin I: 0.01 ng/mL (ref 0.00–0.06)
Troponin I: 0.01 ng/mL (ref 0.00–0.06)

## 2010-03-10 LAB — PROTIME-INR
INR: 2.37 — ABNORMAL HIGH (ref 0.00–1.49)
Prothrombin Time: 26 seconds — ABNORMAL HIGH (ref 11.6–15.2)

## 2010-03-10 LAB — BRAIN NATRIURETIC PEPTIDE: Pro B Natriuretic peptide (BNP): 240 pg/mL — ABNORMAL HIGH (ref 0.0–100.0)

## 2010-03-10 LAB — TSH: TSH: 4.388 u[IU]/mL (ref 0.350–4.500)

## 2010-03-10 LAB — APTT: aPTT: 32 seconds (ref 24–37)

## 2010-03-11 DIAGNOSIS — J449 Chronic obstructive pulmonary disease, unspecified: Secondary | ICD-10-CM

## 2010-03-11 DIAGNOSIS — I5033 Acute on chronic diastolic (congestive) heart failure: Secondary | ICD-10-CM

## 2010-03-11 DIAGNOSIS — R609 Edema, unspecified: Secondary | ICD-10-CM

## 2010-03-11 DIAGNOSIS — J4489 Other specified chronic obstructive pulmonary disease: Secondary | ICD-10-CM

## 2010-03-11 DIAGNOSIS — I4949 Other premature depolarization: Secondary | ICD-10-CM

## 2010-03-11 DIAGNOSIS — I509 Heart failure, unspecified: Secondary | ICD-10-CM

## 2010-03-11 DIAGNOSIS — I059 Rheumatic mitral valve disease, unspecified: Secondary | ICD-10-CM

## 2010-03-11 LAB — BASIC METABOLIC PANEL
BUN: 10 mg/dL (ref 6–23)
CO2: 34 mEq/L — ABNORMAL HIGH (ref 19–32)
Calcium: 8.3 mg/dL — ABNORMAL LOW (ref 8.4–10.5)
Chloride: 106 mEq/L (ref 96–112)
Creatinine, Ser: 0.9 mg/dL (ref 0.4–1.2)
GFR calc Af Amer: >60 mL/min (ref >60)
GFR calc non Af Amer: >60 mL/min (ref >60)
Glucose, Bld: 101 mg/dL — ABNORMAL HIGH (ref 70–99)
Potassium: 3.9 mEq/L (ref 3.5–5.1)
Sodium: 144 mEq/L (ref 135–145)

## 2010-03-11 LAB — PROTIME-INR
INR: 2.3 — ABNORMAL HIGH (ref 0.00–1.49)
Prothrombin Time: 25.4 seconds — ABNORMAL HIGH (ref 11.6–15.2)

## 2010-03-12 ENCOUNTER — Inpatient Hospital Stay (HOSPITAL_COMMUNITY): Payer: BC Managed Care – PPO

## 2010-03-12 ENCOUNTER — Encounter: Payer: Self-pay | Admitting: Cardiovascular Disease

## 2010-03-12 DIAGNOSIS — R141 Gas pain: Secondary | ICD-10-CM

## 2010-03-12 DIAGNOSIS — R143 Flatulence: Secondary | ICD-10-CM

## 2010-03-12 DIAGNOSIS — J449 Chronic obstructive pulmonary disease, unspecified: Secondary | ICD-10-CM

## 2010-03-12 DIAGNOSIS — I509 Heart failure, unspecified: Secondary | ICD-10-CM

## 2010-03-12 DIAGNOSIS — R142 Eructation: Secondary | ICD-10-CM

## 2010-03-12 LAB — BASIC METABOLIC PANEL
BUN: 10 mg/dL (ref 6–23)
CO2: 34 mEq/L — ABNORMAL HIGH (ref 19–32)
Calcium: 8.3 mg/dL — ABNORMAL LOW (ref 8.4–10.5)
Chloride: 101 mEq/L (ref 96–112)
Creatinine, Ser: 0.85 mg/dL (ref 0.4–1.2)
GFR calc Af Amer: >60 mL/min (ref >60)
GFR calc non Af Amer: >60 mL/min (ref >60)
Glucose, Bld: 88 mg/dL (ref 70–99)
Potassium: 4 mEq/L (ref 3.5–5.1)
Sodium: 140 mEq/L (ref 135–145)

## 2010-03-12 LAB — AMYLASE: Amylase: 27 U/L (ref 0–105)

## 2010-03-12 LAB — HEPATIC FUNCTION PANEL
ALT: 25 U/L (ref 0–35)
AST: 21 U/L (ref 0–37)
Albumin: 3.7 g/dL (ref 3.5–5.2)
Alkaline Phosphatase: 52 U/L (ref 39–117)
Bilirubin, Direct: 0.2 mg/dL (ref 0.0–0.3)
Indirect Bilirubin: 0.9 mg/dL (ref 0.3–0.9)
Total Bilirubin: 1.1 mg/dL (ref 0.3–1.2)
Total Protein: 6.1 g/dL (ref 6.0–8.3)

## 2010-03-12 LAB — LACTATE DEHYDROGENASE: LDH: 197 U/L (ref 94–250)

## 2010-03-12 LAB — BRAIN NATRIURETIC PEPTIDE: Pro B Natriuretic peptide (BNP): 120 pg/mL — ABNORMAL HIGH (ref 0.0–100.0)

## 2010-03-12 LAB — LACTIC ACID, PLASMA: Lactic Acid, Venous: 1 mmol/L (ref 0.5–2.2)

## 2010-03-12 LAB — PROTIME-INR
INR: 2.44 — ABNORMAL HIGH (ref 0.00–1.49)
Prothrombin Time: 26.6 seconds — ABNORMAL HIGH (ref 11.6–15.2)

## 2010-03-12 LAB — LIPASE, BLOOD: Lipase: 30 U/L (ref 11–59)

## 2010-03-12 LAB — CK: Total CK: 43 U/L (ref 7–177)

## 2010-03-12 MED ORDER — TECHNETIUM TO 99M ALBUMIN AGGREGATED
6.0000 | Freq: Once | INTRAVENOUS | Status: AC | PRN
Start: 2010-03-12 — End: 2010-03-12
  Administered 2010-03-12: 6 via INTRAVENOUS

## 2010-03-12 MED ORDER — XENON XE 133 GAS
10.0000 | GAS_FOR_INHALATION | Freq: Once | RESPIRATORY_TRACT | Status: AC | PRN
  Administered 2010-03-12: 10 via RESPIRATORY_TRACT

## 2010-03-13 ENCOUNTER — Inpatient Hospital Stay (HOSPITAL_COMMUNITY): Payer: BC Managed Care – PPO

## 2010-03-13 LAB — BASIC METABOLIC PANEL
BUN: 12 mg/dL (ref 6–23)
CO2: 35 mEq/L — ABNORMAL HIGH (ref 19–32)
Calcium: 8.2 mg/dL — ABNORMAL LOW (ref 8.4–10.5)
Chloride: 101 mEq/L (ref 96–112)
Creatinine, Ser: 0.92 mg/dL (ref 0.4–1.2)
GFR calc Af Amer: >60 mL/min (ref >60)
GFR calc non Af Amer: >60 mL/min (ref >60)
Glucose, Bld: 105 mg/dL — ABNORMAL HIGH (ref 70–99)
Potassium: 3.8 mEq/L (ref 3.5–5.1)
Sodium: 142 mEq/L (ref 135–145)

## 2010-03-13 LAB — PROTIME-INR
INR: 2.56 — ABNORMAL HIGH (ref 0.00–1.49)
Prothrombin Time: 27.6 seconds — ABNORMAL HIGH (ref 11.6–15.2)

## 2010-03-14 ENCOUNTER — Inpatient Hospital Stay (HOSPITAL_COMMUNITY): Payer: BC Managed Care – PPO

## 2010-03-14 DIAGNOSIS — R609 Edema, unspecified: Secondary | ICD-10-CM

## 2010-03-14 LAB — PROTIME-INR
INR: 2.84 — ABNORMAL HIGH (ref 0.00–1.49)
Prothrombin Time: 29.9 seconds — ABNORMAL HIGH (ref 11.6–15.2)

## 2010-03-15 DIAGNOSIS — I5031 Acute diastolic (congestive) heart failure: Secondary | ICD-10-CM

## 2010-03-15 LAB — PROTIME-INR
INR: 2.73 — ABNORMAL HIGH (ref 0.00–1.49)
Prothrombin Time: 29 seconds — ABNORMAL HIGH (ref 11.6–15.2)

## 2010-03-16 LAB — COMPREHENSIVE METABOLIC PANEL
ALT: 23 U/L (ref 0–35)
AST: 21 U/L (ref 0–37)
Albumin: 3.4 g/dL — ABNORMAL LOW (ref 3.5–5.2)
Alkaline Phosphatase: 55 U/L (ref 39–117)
BUN: 12 mg/dL (ref 6–23)
CO2: 31 mEq/L (ref 19–32)
Calcium: 7.9 mg/dL — ABNORMAL LOW (ref 8.4–10.5)
Chloride: 104 mEq/L (ref 96–112)
Creatinine, Ser: 0.86 mg/dL (ref 0.4–1.2)
GFR calc Af Amer: >60 mL/min (ref >60)
GFR calc non Af Amer: >60 mL/min (ref >60)
Glucose, Bld: 100 mg/dL — ABNORMAL HIGH (ref 70–99)
Potassium: 4.1 mEq/L (ref 3.5–5.1)
Sodium: 141 mEq/L (ref 135–145)
Total Bilirubin: 1 mg/dL (ref 0.3–1.2)
Total Protein: 6 g/dL (ref 6.0–8.3)

## 2010-03-16 LAB — PROTIME-INR
INR: 3.1 — ABNORMAL HIGH (ref 0.00–1.49)
Prothrombin Time: 32 seconds — ABNORMAL HIGH (ref 11.6–15.2)

## 2010-03-18 ENCOUNTER — Encounter: Payer: Self-pay | Admitting: Internal Medicine

## 2010-03-18 ENCOUNTER — Encounter (INDEPENDENT_AMBULATORY_CARE_PROVIDER_SITE_OTHER): Payer: BC Managed Care – PPO

## 2010-03-18 DIAGNOSIS — I2699 Other pulmonary embolism without acute cor pulmonale: Secondary | ICD-10-CM

## 2010-03-18 DIAGNOSIS — Z7901 Long term (current) use of anticoagulants: Secondary | ICD-10-CM

## 2010-03-18 LAB — CONVERTED CEMR LAB: POC INR: 2.4

## 2010-03-18 NOTE — Consult Note (Addendum)
NAME:  [name] [name], [name] [name] NO.:  [id number]  MEDICAL RECORD NO.:  [id number]           PATIENT TYPE:  I  LOCATION:  3729                         FACILITY:  [facility]  PHYSICIAN:  Rollene Rotunda, MD, FACCDATE OF BIRTH:  March 04, 1950  DATE OF CONSULTATION: DATE OF DISCHARGE:                                CONSULTATION   PRIMARY CARDIOLOGIST:  Jesse Sans. Wall, MD, FACC  ELECTROPHYSIOLOGY:  Doylene Canning. Ladona Ridgel, MD  PRIMARY MEDICAL DOCTOR:  Dr. Moody Bruins.  PULMONOLOGIST:  Charlaine Dalton. Sherene Sires, MD, FCCP  CHIEF COMPLAINT:  Shortness of breath.  HISTORY OF PRESENT ILLNESS:  [name] [name] is a 60 year old female with a history of atrial fibrillation/flutter, PVCs, normal coronary arteries by cath in 2003, COPD on home O2, history of DVT and PE remotely on Coumadin who was admitted from the office with dyspnea by Dr. Sherene Sires.  She complains of several days of increased shortness of breath at rest.  She was seen in the office on March 05, 2010, and lab work was drawn. She was told to take an extra 40 mg of Lasix for 3 days on top of her usual 40 mg daily dose as well as a prednisone taper.  She has not used her inhalers more often although they were not helping as usual.  She took 3 days worth of both.  Yesterday, she was called by the lab for having an elevated BNP and at that phone conversation noted that she fell on Saturday.  She was in [place] climbing some stairs and felt that she just did not have enough oxygen, so fell forward.  She cannot describe this event further and denies any chest pain, loss of consciousness, palpitations, or leg weakness, simply stating that she did not have enough oxygen so she fell.  She did not hit her head.  She grabbed the ground and raised herself with her hands.  She also notices increase in swelling of her extremities including her hands, ankles, and feet more so than usual.  Between Friday and Monday, she gained approximately 5 pounds.  She  denied any chest pain whatsoever to myself, but upon interrogation by Dr. Antoine Poche states that she does occasionally have chest pain but this dates back to 2003 in which she had a cardiac catheterization.  She has had no change in her rare symptoms.  She has mild occasional back pain.  PAST MEDICAL HISTORY: 1. Atrial flutter with RVR.     a.     Status post DCCV June 2010.     b.     Maintained on Rythmol.     c.     Status post EPS and RFA ablation, through atrial flutter      isthmus, August 2010 by Dr. Ladona Ridgel.  The patient has a history of      AFib as well. 2. DVT and PE diagnosed in the late 1990s, on chronic Coumadin. 3. COPD, on home O2. 4. Hypertension. 5. Obesity. 6. Atypical chest pain with cath in 2003 demonstrated a normal     coronary arteries with normal EF and a negative Myoview in 2007.  Last echo was in 2007 of poor acoustic window showing an overall     normal LV function. 7. PVCs. 8. Status post tonsillectomy. 9. Benign lumpectomy in the 1980s of her breast. 10.Skin graft to the right finger in the 1970s. OUTPATIENT MEDICATIONS: 1. Chlor-Trimeton q.6 h. 4 mg p.r.n. 2. Xopenex 2 puffs inhaled with nebulizers q.4 h. p.r.n. 3. Vitamin D 30,000 units daily. 4. Tylenol b.i.d. p.r.n. 5. Tramadol 50 mg 1-2 tablets q.4 h. p.r.n. 6. Symbicort 2 puffs inhaled b.i.d. 7. Rythmol 325 mg b.i.d. 8. Omeprazole 20 mg nightly q.a.m. 9. Mucinex 600 mg q.12 h. p.r.n. 10.Potassium chloride 10 mEq b.i.d. 11.Glucosamine and chondroitin. 12.Lasix 40 mg p.o. daily, with the exception of taking 80 mg daily     for the last 3 days. 13.Fluoxetine 40 mg daily. 14.Exforge 10/325 mg daily. 15.Coumadin per directions. 16.Calcium carbonate with vitamin D. 17.Afrin inhaled b.i.d. p.r.n.  ALLERGIES:  PENICILLIN and CYMBALTA.  SOCIAL HISTORY:  [name] [name] is married.  She has 3 grown children, one of her daughters lives in [place].  She is retired.  She quit smoking in the 1990s  and has a 30-year history.  She denies any alcohol use. She does not use any salt and has not bought any in many years.  She tries to use little salt products.  She recently started an exercise program with her husband and lost 12 pounds, but over the last week has gained some of that weight back.  FAMILY HISTORY:  Mother died at age 90 of heart disease, an MI and also hypertension.  Her father died of liver poisoning from alcohol at 90. She had 2 brothers who had throat cancer and one brother who had AIDS.  REVIEW OF SYSTEMS:  No fevers or chills.  She does have some orthopnea. No nausea, vomiting, diarrhea, melena, or hematemesis.  All other systems reviewed and otherwise negative except for those noted in HPI.  LABORATORY FINDINGS:  WBC 9.1, hemoglobin 11.6, hematocrit 35.9, platelet count 237.  Sodium 144, potassium 2.3, chloride 104, CO2 of 30, glucose 87, BUN 13, creatinine 0.93.  BNP is 240, INR is 2.37.  Cardiac enzymes negative x1.  EKG; sinus rhythm, ventricular bigeminy.  RADIOLOGY:  No chest x-ray yet here.  PHYSICAL EXAMINATION:  VITAL SIGNS:  Temperature 98.2, pulse 74, respirations 20, blood pressure 170/78. GENERAL:  This is a very pleasant obese white female in no acute distress. HEENT:  Normocephalic, atraumatic with extraocular movements intact. Clear sclerae.  Nares are without discharge. NECK:  Supple without obvious JVD or hepatojugular reflux.  No carotid bruits auscultated. CARDIAC:  Auscultation of the heart reveals regular rate and rhythm with no murmurs, rubs or gallops. LUNGS:  Clear to auscultation bilaterally without wheezes, rales or rhonchi. ABDOMEN:  Soft, nontender, nondistended.  Positive bowel sounds. EXTREMITIES:  Warm and dry with mild edema bilaterally. NEUROLOGIC:  She is alert and oriented x3.  Responds to questions appropriately.  ASSESSMENT AND PLAN:  The patient was seen and examined by Dr. Antoine Poche and myself.  This is a 60 year old  female with a history of atrial flutter/AFib, PE on chronic Coumadin, COPD, and normal coronary arteries by cath in 2003 with normal LV function last evaluated by echo with poor windows in 2007.  She presents with worsening of chronic dyspnea.  It is difficult to assess her status.  These symptoms are different than her previous chronic dyspnea attributed to her COPD symptoms.  Chest x-ray is pending at this time.  There  is no suggestion of ischemia in her symptomatology.  She has had mildly increased edema and increased weight for few days and at this time her dyspnea is thought to be a component of diastolic dysfunction.  We doubt systolic dysfunction.  She endorses good compliance with lack of salt.  At this time, we will recommend check a 2-D echocardiogram to assess her LV function.  We will increase her Lasix to 80 mg in the morning and 40 mg in the evening.  Her Rythmol will be continued and we will follow strict I's and O's and daily weights.  Thank you for the opportunity to participate in the care of this patient.     Ronie Spies, P.A.C.   ______________________________ Rollene Rotunda, MD, [facility]    DD/MEDQ  D:  03/10/2010  T:  03/11/2010  Job:  [id number]  cc:   Thomas C. Daleen Squibb, MD, [facility] Doylene Canning. Ladona Ridgel, MD Charlaine Dalton Sherene Sires, MD, [facility]  Electronically Signed by Ronie Spies  on 03/17/2010 07:36:58 PM Electronically Signed by Rollene Rotunda MD [facility] on 03/26/2010 08:00:03 PM

## 2010-03-19 ENCOUNTER — Inpatient Hospital Stay (INDEPENDENT_AMBULATORY_CARE_PROVIDER_SITE_OTHER): Payer: BC Managed Care – PPO | Admitting: Adult Health

## 2010-03-19 ENCOUNTER — Encounter: Payer: Self-pay | Admitting: Adult Health

## 2010-03-19 DIAGNOSIS — J9620 Acute and chronic respiratory failure, unspecified whether with hypoxia or hypercapnia: Secondary | ICD-10-CM

## 2010-03-19 DIAGNOSIS — J449 Chronic obstructive pulmonary disease, unspecified: Secondary | ICD-10-CM

## 2010-03-19 NOTE — Assessment & Plan Note (Signed)
Summary: Admit History and Physical Examination   Primary Provider/Referring Provider:  Corwin Levins MD  CC:  Dyspnea- worse.  History of Present Illness: 3  yowf last smoked 2003 with   COPD with minimum asthmatic component with an FEV1 of 55%, a ratio of 53%, and a diffusing capacity 51% documented 07/25/06.  On 02 24 hours per day at 2lpm  6/09  weighed 271 and it was felt she was as debilitated as much by obesity as by airflow obstruction. baseline = when well = walk to mother in law's house but stops at least once because it's uphill and frequently requiring rescue rx.  March 01, 2008--post hospitalization follow up. Admitted 02/23/08 for post op respiratory difficulties. she underwent  left Wrist surgery, post op dyspnea, cough/wheezing. Admitted w/ aggressive resp toilet and IV steroids. Discharged. 02/27/08 improved.    May 01, 2008--Presents for follow up and med review and new  med calendar -  Since last visit doing much better.   June 20, 2008 ov post hosp for [facility] req d/c cardioversion but no change in rx,  walking 30 min daily on 02 c/o increaese cough day = night, dry.  wt = 265   September 16, 2009 2 month followup.  Pt c/o "not getting enough air"- worse over the past few wks.  Gets out of breath with "any movement".   new L sided cp x 3 weeks similar to previous clots.  Pain comes and goes no pattern not pleuritic, lasts up to several days at a time, post left chest but  no increase cough or worse with cough.   rec no change in rx and no change  December 16, 2009 ov cc dypnea no change after stopped spiriva didn't think it helped and wasn't sure the pills had any medication in them. no cough.     March 05, 2010-ov c/o  increased SOB with rest and activity x 4-5 days with associated wheezing. .  Ankles more  swollen. than usual. bnp 292    rec Prednisone taper over next week.  Extra Furosemide 40mg  once daily x 3days Low salt diet  Legs elevated.   page 2     March 10, 2010 ov Dyspnea- worse pulse low, legs swelling no better p prednisone and increasing lasix.  L post cp off and on x 3 days but not pleuritic,  no cough, no nausea. Pt denies any significant sore throat, dysphagia, itching, sneezing,  nasal congestion or excess secretions,  fever, chills, sweats, unintended wt loss, pleuritic or exertional cp, hempoptysis, change in activity tolerance  orthopnea pnd or leg swelling. Pt also denies any obvious fluctuation in symptoms with weather or environmental change or other alleviating or aggravating factors.       Current Medications (verified): 1)  Furosemide 40 Mg  Tabs (Furosemide) .... Take 1 Tablet By Mouth Once A Day and 1 Extra Once Daily As Needed 2)  Coumadin 7.5 Mg Tabs (Warfarin Sodium) .... Take As Directed By Coumadin Clinic. 3)  Potassium Chloride Crys Cr 20 Meq Cr-Tabs (Potassium Chloride Crys Cr) .Marland Kitchen.. 1 Two Times A Day 4)  Fluoxetine Hcl 40 Mg Caps (Fluoxetine Hcl) .Marland Kitchen.. 1 Once Daily 5)  Omeprazole 20 Mg Cpdr (Omeprazole) .Marland Kitchen.. 1 By Mouth 30 Min Before 1st Meal 6)  Propafenone Hcl 325 Mg Xr12h-Cap (Propafenone Hcl) .... 2 Tab Daily 7)  Nasonex 50 Mcg/act Susp (Mometasone Furoate) .... 2 Puffs Each Nostril Two Times A Day 8)  Symbicort 160-4.5  Mcg/act  Aero (Budesonide-Formoterol Fumarate) .... Two Puffs Twice Daily 9)  Exforge 10-320 Mg  Tabs (Amlodipine Besylate-Valsartan) .Marland Kitchen.. 1tab By Mouth Once Daily 10)  Oxygen 2l/min .... Continuous 11)  Glucosamine-Chondroitin   Caps (Glucosamine-Chondroit-Vit C-Mn) .... Take 1 Capsule By Mouth Once A Day 12)  Calcium Carbonate-Vitamin D 600-400 Mg-Unit  Tabs (Calcium Carbonate-Vitamin D) .... Take 1 Tablet By Mouth Two Times A Day 13)  Vitamin D 1000 Unit Tabs (Cholecalciferol) .... Take 1 Tablet By Mouth Once A Day 14)  Xopenex 1.25 Mg/38ml  Nebu (Levalbuterol Hcl) .... One Every 4 Hours As Needed 15)  Xopenex Hfa 45 Mcg/act  Aero (Levalbuterol Tartrate) .... Inhale 2 Puffs Every 4  Hours As Needed 16)  Tylenol Arthritis Pain 650 Mg Cr-Tabs (Acetaminophen) .... Per Bottle 17)  Mucinex Dm 30-600 Mg Xr12h-Tab (Dextromethorphan-Guaifenesin) .Marland Kitchen.. 1 Every 12 Hours As Needed W/ Flutter 18)  Tramadol Hcl 50 Mg  Tabs (Tramadol Hcl) .... Take 1 Tablet By Mouth Every 4 Hours As  Needed For Cough 19)  Chlor-Trimeton 4 Mg Tabs (Chlorpheniramine Maleate) .Marland Kitchen.. 1 Every 4 Hours As Needed 20)  Cvs Saline Nasal Spray 0.65 %  Soln (Saline) .... 2 Puffs Every 4 Hours As Needed 21)  Afrin Nasal Spray 0.05 % Soln (Oxymetazoline Hcl) .Marland Kitchen.. 1 Puff Two Times A Day X5days - Before Nasonex 22)  Prolia 60 Mg/ml Soln (Denosumab) .... Inject Once Every 6 Months 23)  Prednisone 10 Mg Tabs (Prednisone) .... 4 Tabs For 2 Days, Then 3 Tabs For 2 Days, 2 Tabs For 2 Days, Then 1 Tab For 2 Days, Then Stop  Allergies (verified): 1)  ! Pcn 2)  * Cymbalta  Past History:  Past Medical History: CHRONIC RHINITIS (ICD-472.0) EDEMA (ICD-782.3) ACUTE AND CHRONIC RESPIRATORY FAILURE (ICD-518.84) MORBID OBESITY (ICD-278.01)     - Target wt  =   179  for BMI < 30 peak wt 282     - Referred back to nutrition December 18, 2007  DEEP VENOUS THROMBOPHLEBITIS, HX OF (ICD-V12.52) COPD (ICD-496)..........................................................Marland KitchenWert     - PFTs  07/25/06 FEV1 55% ratio 53, DLC0 51%      - HFA  50% December 16, 2009  DEPRESSION (ICD-311) OSTEOPOROSIS (ICD-733.00) last BMD 4/08 -2.4, intolerant of bisphosphonates  HYPERTENSION (ICD-401.9) Atrial fibrillation - paroxysmal Anticoagulation therapy Pulmonary embolism, hx of neg stress myoview 10/07 Anxiety glucose intolerance - on steroids Hyperlipidemia Complex medical regimen    - Med calendar done 05/01/2008  Family History: Reviewed history from 07/07/2007 and no changes required. asthma and brothers and son.   Heart disease in mother.   Throat cancer in brother.  Social History: Reviewed history from 06/18/2009 and no changes  required. 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 280 February 07, 2008  > 274  December 16, 2009 >>273 03/05/10  > 278 March 10, 2010  HEENT: nl dentition, turbinates, and orophanx. Nl external ear canals without cough reflex Neck without JVD/Nodes/TM Lungs coarse BS w/ few rhonchi RRR no m/r/g , 1+ edema  Abd soft and benign with limited excursion in the supine position.  Ext warm without calf tenderness, cyanosis clubbing  Skin warm and dry without lesions     Impression & Recommendations:  Problem # 1:  DYSPNEA (ICD-786.09)  Assoc with atypical cp and leg swelling with ventricular bigeminy on ekg so admit telemetry and cardiology eval    Problem # 2:  COPD (ICD-496)   DDX of  difficult airways managment all start with A and  include Adherence, Ace Inhibitors, Acid Reflux, Active Sinus Disease, Alpha 1 Antitripsin deficiency, Anxiety masquerading as Airways dz,  ABPA,  allergy(esp in young), Aspiration (esp in elderly), Adverse effects of DPI,  Active smokers, plus two Bs  = Bronchiectasis and Beta blocker use..and one C= CHF    CHF the most likely cause for flare of symptoms  Medications Added to Medication List This Visit: 1)  Potassium Chloride Crys Cr 20 Meq Cr-tabs (Potassium chloride crys cr) .Marland Kitchen.. 1 two times a day  Other Orders: No Charge Patient Arrived (NCPA0) (NCPA0)

## 2010-03-19 NOTE — Progress Notes (Signed)
Summary: returned call > pt aware  Phone Note Call from Patient Call back at Home Phone [phone]   Caller: Patient Call For: parrett Summary of Call: pt returned call from jj (from last fri- pt was out-of-town).  Initial call taken by: Tivis Ringer, CNA,  March 09, 2010 9:41 AM  Follow-up for Phone Call        pt aware - see append to 2.23.12 labs. Boone Master CNA/MA  March 09, 2010 2:49 PM

## 2010-03-19 NOTE — Assessment & Plan Note (Signed)
Summary: Acute NP MW:[id number]    Primary Provider/Referring Provider:  Corwin Levins MD  CC:  Pt c/o  increased SOB with rest and activity x 4-5 days. Pt denies chest congestion or cough.  Pt states she  is not taking Spiriva anymore. Marland Kitchen  History of Present Illness: 50  yowf last smoked 2003 with   COPD with minimum asthmatic component with an FEV1 of 55%, a ratio of 53%, and a diffusing capacity 51% documented 07/25/06.  On 02 24 hours per day at 2lpm  6/09  weighed 271 and it was felt she was as debilitated as much by obesity as by airflow obstruction. baseline = when well = walk to mother in law's house but stops at least once because it's uphill and frequently requiring rescue rx.  March 01, 2008--post hospitalization follow up. Admitted 02/23/08 for post op respiratory difficulties. she underwent  left Wrist surgery, post op dyspnea, cough/wheezing. Admitted w/ aggressive resp toilet and IV steroids. Discharged. 02/27/08 improved.    May 01, 2008--Presents for follow up and med review and new  med calendar -  Since last visit doing much better.   June 20, 2008 ov post hosp for [facility] req d/c cardioversion but no change in rx,  walking 30 min daily on 02 c/o increaese cough day = night, dry.  wt = 265   September 16, 2009 2 month followup.  Pt c/o "not getting enough air"- worse over the past few wks.  Gets out of breath with "any movement".   new L sided cp x 3 weeks similar to previous clots.  Pain comes and goes no pattern not pleuritic, lasts up to several days at a time, post left chest but  no increase cough or worse with cough.   rec no change in rx and no change  December 16, 2009 ov cc dypnea no change after stopped spiriva didn't think it helped and wasn't sure the pills had any medication in them. no cough.     March 05, 2010- Presents for an acute office visit.-Pt c/o  increased SOB with rest and activity x 4-5 days with associated wheezing. . Pt denies chest congestion or  increased cough,no fever . Ankles more  swollen. than usual. Denies chest pain, hemoptysis, fever, n/v/d, headache. No otc used. Worse early and late at bedtime.   Preventive Screening-Counseling & Management  Alcohol-Tobacco     Smoking Status: quit     Packs/Day: 1.0     Year Started: 1968     Year Quit: 1998     Pack years: 30  Current Medications (verified): 1)  Furosemide 40 Mg  Tabs (Furosemide) .... Take 1 Tablet By Mouth Once A Day and 1 Extra Once Daily As Needed 2)  Coumadin 7.5 Mg Tabs (Warfarin Sodium) .... Take As Directed By Coumadin Clinic. 3)  Potassium Chloride Crys Cr 20 Meq Cr-Tabs (Potassium Chloride Crys Cr) .... Take 2 Tablets in Am and 1 Tablet in Pm 4)  Fluoxetine Hcl 40 Mg Caps (Fluoxetine Hcl) .Marland Kitchen.. 1 Once Daily 5)  Omeprazole 20 Mg Cpdr (Omeprazole) .Marland Kitchen.. 1 By Mouth 30 Min Before 1st Meal 6)  Propafenone Hcl 325 Mg Xr12h-Cap (Propafenone Hcl) .... 2 Tab Daily 7)  Nasonex 50 Mcg/act Susp (Mometasone Furoate) .... 2 Puffs Each Nostril Two Times A Day 8)  Symbicort 160-4.5 Mcg/act  Aero (Budesonide-Formoterol Fumarate) .... Two Puffs Twice Daily 9)  Exforge 10-320 Mg  Tabs (Amlodipine Besylate-Valsartan) .Marland Kitchen.. 1tab By Mouth Once Daily  10)  Spiriva Handihaler 18 Mcg  Caps (Tiotropium Bromide Monohydrate) .... Inhale Contents of 1 Capsule Once A Day-Stopped Taking 11)  Oxygen 2l/min .... Continuous 12)  Glucosamine-Chondroitin   Caps (Glucosamine-Chondroit-Vit C-Mn) .... Take 1 Capsule By Mouth Once A Day 13)  Calcium Carbonate-Vitamin D 600-400 Mg-Unit  Tabs (Calcium Carbonate-Vitamin D) .... Take 1 Tablet By Mouth Two Times A Day 14)  Vitamin D 1000 Unit Tabs (Cholecalciferol) .... Take 1 Tablet By Mouth Once A Day 15)  Xopenex 1.25 Mg/13ml  Nebu (Levalbuterol Hcl) .... One Every 4 Hours As Needed 16)  Xopenex Hfa 45 Mcg/act  Aero (Levalbuterol Tartrate) .... Inhale 2 Puffs Every 4 Hours As Needed 17)  Tylenol Arthritis Pain 650 Mg Cr-Tabs (Acetaminophen) .... Per  Bottle 18)  Mucinex Dm 30-600 Mg Xr12h-Tab (Dextromethorphan-Guaifenesin) .Marland Kitchen.. 1 Every 12 Hours As Needed W/ Flutter 19)  Tramadol Hcl 50 Mg  Tabs (Tramadol Hcl) .... Take 1 Tablet By Mouth Every 4 Hours As  Needed For Cough 20)  Chlor-Trimeton 4 Mg Tabs (Chlorpheniramine Maleate) .Marland Kitchen.. 1 Every 4 Hours As Needed 21)  [facility]vs Saline Nasal Spray 0.65 %  Soln (Saline) .... 2 Puffs Every 4 Hours As Needed 22)  Afrin Nasal Spray 0.05 % Soln (Oxymetazoline Hcl) .Marland Kitchen.. 1 Puff Two Times A Day X5days - Before Nasonex 23)  Prolia 60 Mg/ml Soln (Denosumab) .... Inject Once Every 6 Months  Allergies (verified): 1)  ! Pcn 2)  * Cymbalta  Past History:  Family History: Last updated: 07/07/2007 asthma and brothers and son.   Heart disease in mother.   Throat cancer in brother.  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 Pt denies chest congestion or cough.  Pt states she  is not taking Spiriva anymore.  Is Patient Diabetic? No Comments Medications reviewed with patient Carron Curie CMA  March 05, 2010 2:02 PM Daytime phone number verified with patient.    Physical Exam  Additional Exam:  Ambulatory healthy appearing in no acute distress. 280 February 07, 2008  >259 May 01, 2008>   277 .September 16, 2009 >  274  December 16, 2009 >>273 03/05/10  HEENT: nl dentition, turbinates, and orophanx. Nl external ear canals without cough reflex Neck without JVD/Nodes/TM Lungs coarse BS w/ few rhonchi RRR no m/r/g , 1+ edema  Abd soft and benign with limited excursion in the supine position.  Ext warm without calf tenderness, cyanosis clubbing  Skin warm and dry without lesions     Impression & Recommendations:  Problem # 1:  COPD (ICD-496)  Flare with suspected volume overload,  labs pending.  plan; Prednisone taper over next week.  Extra Furosemide  40mg  once daily x 3days Low salt diet  Legs elevated.  follow up Dr. Sherene Sires in 3weeks Please contact office for sooner follow up if symptoms do not improve or worsen  I will call with labs   Orders: Est. Patient Level IV (57846)  Medications Added to Medication List This Visit: 1)  Furosemide 40 Mg Tabs (Furosemide) .... Take 1 tablet by mouth once a day and 1 extra once daily as needed 2)  Potassium Chloride Crys Cr 20 Meq Cr-tabs (Potassium chloride crys cr) .... Take 2 tablets in am and 1 tablet in pm 3)  Prednisone 10 Mg Tabs (Prednisone) .... 4 tabs for 2 days, then 3 tabs for 2 days, 2 tabs for 2 days, then 1 tab for 2 days, then stop  Other Orders: TLB-BMP (Basic Metabolic Panel-BMET) (80048-METABOL) TLB-BNP (B-Natriuretic Peptide) (83880-BNPR)  Patient Instructions: 1)  Prednisone taper over next week.  2)  Extra Furosemide 40mg  once daily x 3days 3)  Low salt diet  4)  Legs elevated.  5)  follow up Dr. Sherene Sires in 3weeks 6)  Please contact office for sooner follow up if symptoms do not improve or worsen  7)  I will call with labs  Prescriptions: PREDNISONE 10 MG TABS (PREDNISONE) 4 tabs for 2 days, then 3 tabs for 2 days, 2 tabs for 2 days, then 1 tab for 2 days, then stop  #20 x 0   Entered and Authorized by:   Rubye Oaks NP   Signed by:   Rubye Oaks NP on 03/05/2010   Method used:   Electronically to        [facility]  [address] [phone]*  (retail)       [address]       [place], [place]  [id number]       Ph: [phone] or [phone]       Fax: [phone]   RxID:   [id number]

## 2010-03-19 NOTE — Progress Notes (Signed)
Summary: Rx refill req  Phone Note Refill Request Message from:  Patient on March 09, 2010 10:30 AM  Refills Requested: Medication #1:  EXFORGE 10-320 MG  TABS 1tab by mouth once daily   Dosage confirmed as above?Dosage Confirmed   Supply Requested: 6 months  Method Requested: Fax to [facility] Initial call taken by: Margaret Pyle, CMA,  March 09, 2010 10:31 AM    Prescriptions: EXFORGE 10-320 MG  TABS (AMLODIPINE BESYLATE-VALSARTAN) 1tab by mouth once daily  #90 x 1   Entered by:   Margaret Pyle, CMA   Authorized by:   Corwin Levins MD   Signed by:   Margaret Pyle, CMA on 03/09/2010   Method used:   Faxed to ...       [facility] (mail-order)             , [place]         Ph: [phone]       Fax: [phone]   RxID:   [id number]

## 2010-03-24 NOTE — Medication Information (Signed)
Summary: rov/ewj  Anticoagulant Therapy  Managed by: Bethena Midget, RN, BSN Referring MD: Valera Castle MD PCP: Corwin Levins MD Supervising MD: Gala Romney MD, Reuel Boom Indication 1: Pulmonary embolism (ICD-415.19) Lab Used: [facility] Site:  INR POC 2.4 INR RANGE 2 - 3  Dietary changes: yes       Details: poor appetite  Health status changes: no    Bleeding/hemorrhagic complications: no    Recent/future hospitalizations: yes       Details: [facility] from 03/11/10 to 03/16/10 for CHF  Any changes in medication regimen? yes       Details: D/C home on Avelox 400mg  QD for 10 days has 5 days left  Recent/future dental: no  Any missed doses?: yes     Details: Instructed to hold coumadin from day of discharge which was Monday to today at INR check   Is patient compliant with meds? yes       Allergies: 1)  ! Pcn 2)  * Cymbalta  Anticoagulation Management History:      The patient is taking warfarin and comes in today for a routine follow up visit.  Negative risk factors for bleeding include an age less than 50 years old.  The bleeding index is 'low risk'.  Positive CHADS2 values include History of HTN.  Negative CHADS2 values include Age > 52 years old.  The start date was 06/13/2008.  Her last INR was 5.6.  Anticoagulation responsible provider: Mishka Stegemann MD, Reuel Boom.  INR POC: 2.4.  Cuvette Lot#: [id number].  Exp: 02/2011.    Anticoagulation Management Assessment/Plan:      The patient's current anticoagulation dose is Coumadin 7.5 mg tabs: Take as directed by coumadin clinic..  The target INR is 2 - 3.  The next INR is due 03/25/2010.  Anticoagulation instructions were given to patient.  Results were reviewed/authorized by Bethena Midget, RN, BSN.  She was notified by Bethena Midget, RN, BSN.         Prior Anticoagulation Instructions: INR 2.4  Continue on same dosage 1 tablet daily except 1.5 tablets on Tuesdays and Saturdays.  Recheck in 4 weeks.     Current Anticoagulation Instructions: INR 2.4 Take 7.5mg s  daily, which is one pill until you finish Avelox.  Recheck in one week.

## 2010-03-24 NOTE — Consult Note (Addendum)
Summary: [facility] Consultation Report  [facility] Consultation Report   Imported By: Earl Many 03/19/2010 10:13:49  _____________________________________________________________________  External Attachment:    Type:   Image     Comment:   External Document

## 2010-03-24 NOTE — Assessment & Plan Note (Signed)
Summary: NP follow up - post hosp   Primary Provider/Referring Provider:  Corwin Levins MD  CC:  post hosp follow up - states having prod cough with green mucus, nasal congestion w/ same colored mucus, wheezing, PND, and hoarseness.  History of Present Illness: 60  yowf last smoked 2003 with   COPD with minimum asthmatic component with an FEV1 of 55%, a ratio of 53%, and a diffusing capacity 51% documented 07/25/06.  On 02 24 hours per day at 2lpm  6/09  weighed 271 and it was felt she was as debilitated as much by obesity as by airflow obstruction. baseline = when well = walk to mother in law's house but stops at least once because it's uphill and frequently requiring rescue rx.  March 01, 2008--post hospitalization follow up. Admitted 02/23/08 for post op respiratory difficulties. she underwent  left Wrist surgery, post op dyspnea, cough/wheezing. Admitted w/ aggressive resp toilet and IV steroids. Discharged. 02/27/08 improved.    May 01, 2008--Presents for follow up and med review and new  med calendar -  Since last visit doing much better.   June 20, 2008 ov post hosp for RAF req d/c cardioversion but no change in rx,  walking 30 min daily on 02 c/o increaese cough day = night, dry.  wt = 265   September 16, 2009 2 month followup.  Pt c/o "not getting enough air"- worse over the past few wks.  Gets out of breath with "any movement".   new L sided cp x 3 weeks similar to previous clots.  Pain comes and goes no pattern not pleuritic, lasts up to several days at a time, post left chest but  no increase cough or worse with cough.   rec no change in rx and no change  December 16, 2009 ov cc dypnea no change after stopped spiriva didn't think it helped and wasn't sure the pills had any medication in them. no cough.     March 05, 2010-ov c/o  increased SOB with rest and activity x 4-5 days with associated wheezing. .  Ankles more  swollen. than usual. bnp 292    rec Prednisone taper over  next week.  Extra Furosemide 40mg  once daily x 3days Low salt diet  Legs elevated.   page 2    March 10, 2010 ov Dyspnea- worse pulse low, legs swelling no better p prednisone and increasing lasix.  L post cp off and on x 3 days but not pleuritic,  no cough, no nausea.>>Admitted   March 19, 2010--Presents for Post hospital visit. Admitted 2/28-03/16/10 for Acute on chronic resp failure secondary to decompensated diastolic heart failure and sinusitis. She was tx with aggressive diuresis. Conintued on coumadin. INR yesterday was 2.4. 2 D echo showed EF 55% w/ gr 2 diastolic dysfunction . VQ scan with low probability for PE. Since discharge some better but still real weak. Cough and congestion are some better. Edema worse this am, took with lasix  that helped. Denies chest pain,  orthopnea, hemoptysis, fever, n/v.   Medications Prior to Update: 1)  Furosemide 40 Mg  Tabs (Furosemide) .... Take 1 Tablet By Mouth Once A Day and 1 Extra Once Daily As Needed 2)  Coumadin 7.5 Mg Tabs (Warfarin Sodium) .... Take As Directed By Coumadin Clinic. 3)  Potassium Chloride Crys Cr 20 Meq Cr-Tabs (Potassium Chloride Crys Cr) .Marland Kitchen.. 1 Two Times A Day 4)  Fluoxetine Hcl 40 Mg Caps (Fluoxetine Hcl) .Marland KitchenMarland KitchenMarland Kitchen 1  Once Daily 5)  Omeprazole 20 Mg Cpdr (Omeprazole) .Marland Kitchen.. 1 By Mouth 30 Min Before 1st Meal 6)  Propafenone Hcl 325 Mg Xr12h-Cap (Propafenone Hcl) .... 2 Tab Daily 7)  Nasonex 50 Mcg/act Susp (Mometasone Furoate) .... 2 Puffs Each Nostril Two Times A Day 8)  Symbicort 160-4.5 Mcg/act  Aero (Budesonide-Formoterol Fumarate) .... Two Puffs Twice Daily 9)  Exforge 10-320 Mg  Tabs (Amlodipine Besylate-Valsartan) .Marland Kitchen.. 1tab By Mouth Once Daily 10)  Oxygen 2l/min .... Continuous 11)  Glucosamine-Chondroitin   Caps (Glucosamine-Chondroit-Vit C-Mn) .... Take 1 Capsule By Mouth Once A Day 12)  Calcium Carbonate-Vitamin D 600-400 Mg-Unit  Tabs (Calcium Carbonate-Vitamin D) .... Take 1 Tablet By Mouth Two Times A Day 13)   Vitamin D 1000 Unit Tabs (Cholecalciferol) .... Take 1 Tablet By Mouth Once A Day 14)  Xopenex 1.25 Mg/51ml  Nebu (Levalbuterol Hcl) .... One Every 4 Hours As Needed 15)  Xopenex Hfa 45 Mcg/act  Aero (Levalbuterol Tartrate) .... Inhale 2 Puffs Every 4 Hours As Needed 16)  Tylenol Arthritis Pain 650 Mg Cr-Tabs (Acetaminophen) .... Per Bottle 17)  Mucinex Dm 30-600 Mg Xr12h-Tab (Dextromethorphan-Guaifenesin) .Marland Kitchen.. 1 Every 12 Hours As Needed W/ Flutter 18)  Tramadol Hcl 50 Mg  Tabs (Tramadol Hcl) .... Take 1 Tablet By Mouth Every 4 Hours As  Needed For Cough 19)  Chlor-Trimeton 4 Mg Tabs (Chlorpheniramine Maleate) .Marland Kitchen.. 1 Every 4 Hours As Needed 20)  Cvs Saline Nasal Spray 0.65 %  Soln (Saline) .... 2 Puffs Every 4 Hours As Needed 21)  Afrin Nasal Spray 0.05 % Soln (Oxymetazoline Hcl) .Marland Kitchen.. 1 Puff Two Times A Day X5days - Before Nasonex 22)  Prolia 60 Mg/ml Soln (Denosumab) .... Inject Once Every 6 Months 23)  Prednisone 10 Mg Tabs (Prednisone) .... 4 Tabs For 2 Days, Then 3 Tabs For 2 Days, 2 Tabs For 2 Days, Then 1 Tab For 2 Days, Then Stop  Current Medications (verified): 1)  Furosemide 40 Mg  Tabs (Furosemide) .... 2 Tabs By Mouth Once Daily 2)  Coumadin 7.5 Mg Tabs (Warfarin Sodium) .... Take As Directed By Coumadin Clinic. 3)  Potassium Chloride Crys Cr 20 Meq Cr-Tabs (Potassium Chloride Crys Cr) .Marland Kitchen.. 1 Two Times A Day 4)  Fluoxetine Hcl 40 Mg Caps (Fluoxetine Hcl) .Marland Kitchen.. 1 Once Daily 5)  Omeprazole 20 Mg Cpdr (Omeprazole) .Marland Kitchen.. 1 By Mouth 30 Min Before 1st Meal 6)  Propafenone Hcl 325 Mg Xr12h-Cap (Propafenone Hcl) .... 2 Tab Daily 7)  Nasonex 50 Mcg/act Susp (Mometasone Furoate) .... 2 Puffs Each Nostril Two Times A Day 8)  Symbicort 160-4.5 Mcg/act  Aero (Budesonide-Formoterol Fumarate) .... Two Puffs Twice Daily 9)  Exforge 10-320 Mg  Tabs (Amlodipine Besylate-Valsartan) .Marland Kitchen.. 1tab By Mouth Once Daily 10)  Oxygen 2l/min .... Continuous 11)  Glucosamine-Chondroitin   Caps  (Glucosamine-Chondroit-Vit C-Mn) .... Take 1 Capsule By Mouth Once A Day 12)  Calcium Carbonate-Vitamin D 600-400 Mg-Unit  Tabs (Calcium Carbonate-Vitamin D) .... Take 1 Tablet By Mouth Two Times A Day 13)  Vitamin D 1000 Unit Tabs (Cholecalciferol) .... Take 1 Tablet By Mouth Once A Day 14)  Xopenex 1.25 Mg/64ml  Nebu (Levalbuterol Hcl) .... One Every 4 Hours As Needed 15)  Xopenex Hfa 45 Mcg/act  Aero (Levalbuterol Tartrate) .... Inhale 2 Puffs Every 4 Hours As Needed 16)  Tylenol Arthritis Pain 650 Mg Cr-Tabs (Acetaminophen) .... Per Bottle 17)  Mucinex Dm 30-600 Mg Xr12h-Tab (Dextromethorphan-Guaifenesin) .Marland Kitchen.. 1 Every 12 Hours As Needed W/ Flutter 18)  Tramadol Hcl  50 Mg  Tabs (Tramadol Hcl) .... Take 1 Tablet By Mouth Every 4 Hours As  Needed For Cough 19)  Chlor-Trimeton 4 Mg Tabs (Chlorpheniramine Maleate) .Marland Kitchen.. 1 Every 4 Hours As Needed 20)  Cvs Saline Nasal Spray 0.65 %  Soln (Saline) .... 2 Puffs Every 4 Hours As Needed 21)  Afrin Nasal Spray 0.05 % Soln (Oxymetazoline Hcl) .Marland Kitchen.. 1 Puff Two Times A Day X5days - Before Nasonex 22)  Prolia 60 Mg/ml Soln (Denosumab) .... Inject Once Every 6 Months 23)  Saline Nasal Spray 0.65 % Soln (Saline) .... 2 Spray Each Nostril Four Times A Day As Needed  Allergies (verified): 1)  ! Pcn 2)  * Cymbalta  Past History:  Past Medical History: Last updated: 03/10/2010 CHRONIC RHINITIS (ICD-472.0) EDEMA (ICD-782.3) ACUTE AND CHRONIC RESPIRATORY FAILURE (ICD-518.84) MORBID OBESITY (ICD-278.01)     - Target wt  =   179  for BMI < 30 peak wt 282     - Referred back to nutrition December 18, 2007  DEEP VENOUS THROMBOPHLEBITIS, HX OF (ICD-V12.52) COPD (ICD-496)..........................................................Marland KitchenWert     - PFTs  07/25/06 FEV1 55% ratio 53, DLC0 51%      - HFA  50% December 16, 2009  DEPRESSION (ICD-311) OSTEOPOROSIS (ICD-733.00) last BMD 4/08 -2.4, intolerant of bisphosphonates  HYPERTENSION (ICD-401.9) Atrial fibrillation -  paroxysmal Anticoagulation therapy Pulmonary embolism, hx of neg stress myoview 10/07 Anxiety glucose intolerance - on steroids Hyperlipidemia Complex medical regimen    - Med calendar done 05/01/2008  Past Surgical History: Last updated: 04/15/2008 Hysterectomy Tonsillectomy Skin Graft to midde R finger-1975 s/p left arm fracture with fall off chair  Family History: Last updated: 07/07/2007 asthma and brothers and son.   Heart disease in mother.   Throat cancer in brother.  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 Drug use-no  Risk Factors: Smoking Status: quit (03/05/2010) Packs/Day: 1.0 (03/05/2010)  Review of Systems      See HPI  Vital Signs:  Patient profile:   60 year old female Height:      65 inches Weight:      277 pounds BMI:     46.26 O2 Sat:      97 % on 2 L/min pulsing Temp:     97.5 degrees F oral Pulse rate:   76 / minute BP sitting:   118 / 70  (left arm) Cuff size:   large  Vitals Entered By: Boone Master CNA/MA (March 19, 2010 2:12 PM)  O2 Flow:  2 L/min pulsing CC: post hosp follow up - states having prod cough with green mucus, nasal congestion w/ same colored mucus, wheezing, PND, hoarseness Is Patient Diabetic? No Comments Medications reviewed with patient Daytime contact number verified with patient. Boone Master CNA/MA  March 19, 2010 2:23 PM    Physical Exam  Additional Exam:  Ambulatory healthy appearing in no acute distress. 280 February 07, 2008  > 274  December 16, 2009 >>273 03/05/10  > 278 March 10, 2010 >>277 March 19, 2010  HEENT: nl dentition, turbinates, and orophanx. Nl external ear canals without cough reflex Neck without JVD/Nodes/TM Lungs coarse BS w/ few rhonchi RRR no m/r/g , 1+ edema  Abd soft and benign with limited excursion in the supine position.  Ext warm without calf  tenderness, cyanosis clubbing  Skin warm and dry without lesions  Impression & Recommendations:  Problem # 1:  ACUTE AND CHRONIC RESPIRATORY FAILURE (ICD-518.84)  recent flare with associated diastolic hrt failure decompensation.  improved with diuresis she is on norvasc (exforge)-that could be causing edema-consider in future loweering dose vs med change.   Orders: Est. Patient Level III ([id number])  Problem # 2:  COPD (ICD-496) flare with sinusitis -improving  finish avelox   Problem # 3:  ATRIAL FIBRILLATION (ICD-427.31) cont follow up with cards  Her updated medication list for this problem includes:    Coumadin 7.5 Mg Tabs (Warfarin sodium) .Marland Kitchen... Take as directed by coumadin clinic.    Propafenone Hcl 325 Mg Xr12h-cap (Propafenone hcl) .Marland Kitchen... 2 tab daily  Medications Added to Medication List This Visit: 1)  Furosemide 40 Mg Tabs (Furosemide) .... 2 tabs by mouth once daily 2)  Saline Nasal Spray 0.65 % Soln (Saline) .... 2 spray each nostril four times a day as needed  Patient Instructions: 1)  Finish Avelox  2)  Saline nasal rinses as needed  3)  Mucinex dm as needed  4)  follow med calendar closely and bring to each visit.  5)  follow up Dr. Sherene Sires next week as planned and as needed

## 2010-03-25 ENCOUNTER — Encounter: Payer: Self-pay | Admitting: Cardiovascular Disease

## 2010-03-25 ENCOUNTER — Encounter (INDEPENDENT_AMBULATORY_CARE_PROVIDER_SITE_OTHER): Payer: BC Managed Care – PPO

## 2010-03-25 DIAGNOSIS — I2699 Other pulmonary embolism without acute cor pulmonale: Secondary | ICD-10-CM

## 2010-03-25 DIAGNOSIS — Z7901 Long term (current) use of anticoagulants: Secondary | ICD-10-CM

## 2010-03-25 LAB — CONVERTED CEMR LAB: POC INR: 1.9

## 2010-03-26 ENCOUNTER — Institutional Professional Consult (permissible substitution) (INDEPENDENT_AMBULATORY_CARE_PROVIDER_SITE_OTHER): Payer: BC Managed Care – PPO | Admitting: Cardiovascular Disease

## 2010-03-26 ENCOUNTER — Encounter: Payer: Self-pay | Admitting: Cardiovascular Disease

## 2010-03-26 DIAGNOSIS — R0602 Shortness of breath: Secondary | ICD-10-CM

## 2010-03-26 DIAGNOSIS — J449 Chronic obstructive pulmonary disease, unspecified: Secondary | ICD-10-CM

## 2010-03-26 DIAGNOSIS — I4891 Unspecified atrial fibrillation: Secondary | ICD-10-CM

## 2010-03-27 ENCOUNTER — Encounter: Payer: Self-pay | Admitting: Internal Medicine

## 2010-03-27 ENCOUNTER — Ambulatory Visit (INDEPENDENT_AMBULATORY_CARE_PROVIDER_SITE_OTHER): Payer: BC Managed Care – PPO | Admitting: Internal Medicine

## 2010-03-27 DIAGNOSIS — R0609 Other forms of dyspnea: Secondary | ICD-10-CM

## 2010-03-27 DIAGNOSIS — J9620 Acute and chronic respiratory failure, unspecified whether with hypoxia or hypercapnia: Secondary | ICD-10-CM

## 2010-03-27 DIAGNOSIS — J449 Chronic obstructive pulmonary disease, unspecified: Secondary | ICD-10-CM

## 2010-03-27 DIAGNOSIS — R609 Edema, unspecified: Secondary | ICD-10-CM

## 2010-03-27 DIAGNOSIS — R0989 Other specified symptoms and signs involving the circulatory and respiratory systems: Secondary | ICD-10-CM

## 2010-03-30 LAB — POCT I-STAT, CHEM 8
BUN: 14 mg/dL (ref 6–23)
Calcium, Ion: 1.07 mmol/L — ABNORMAL LOW (ref 1.12–1.32)
Chloride: 104 mEq/L (ref 96–112)
Creatinine, Ser: 0.8 mg/dL (ref 0.4–1.2)
Glucose, Bld: 105 mg/dL — ABNORMAL HIGH (ref 70–99)
HCT: 38 % (ref 36.0–46.0)
Hemoglobin: 12.9 g/dL (ref 12.0–15.0)
Potassium: 3.9 mEq/L (ref 3.5–5.1)
Sodium: 143 mEq/L (ref 135–145)
TCO2: 30 mmol/L (ref 0–100)

## 2010-03-30 LAB — CBC
HCT: 36 % (ref 36.0–46.0)
Hemoglobin: 12.7 g/dL (ref 12.0–15.0)
MCHC: 35.2 g/dL (ref 30.0–36.0)
MCV: 86.9 fL (ref 78.0–100.0)
Platelets: 213 10*3/uL (ref 150–400)
RBC: 4.14 MIL/uL (ref 3.87–5.11)
RDW: 17.4 % — ABNORMAL HIGH (ref 11.5–15.5)
WBC: 5.4 10*3/uL (ref 4.0–10.5)

## 2010-03-30 LAB — DIFFERENTIAL
Basophils Absolute: 0 10*3/uL (ref 0.0–0.1)
Basophils Relative: 0 % (ref 0–1)
Eosinophils Absolute: 0.1 10*3/uL (ref 0.0–0.7)
Eosinophils Relative: 2 % (ref 0–5)
Lymphocytes Relative: 23 % (ref 12–46)
Lymphs Abs: 1.2 10*3/uL (ref 0.7–4.0)
Monocytes Absolute: 0.3 10*3/uL (ref 0.1–1.0)
Monocytes Relative: 6 % (ref 3–12)
Neutro Abs: 3.7 10*3/uL (ref 1.7–7.7)
Neutrophils Relative %: 69 % (ref 43–77)

## 2010-03-30 LAB — PROTIME-INR
INR: 2.25 — ABNORMAL HIGH (ref 0.00–1.49)
Prothrombin Time: 24.7 seconds — ABNORMAL HIGH (ref 11.6–15.2)

## 2010-03-31 NOTE — Assessment & Plan Note (Addendum)
Summary: Pulmonary/ ext f/u ov  with ex sats ok on 2lpm   Primary Provider/Referring Provider:  Corwin Levins MD  CC:  3 wk follow up.  SOB - improved.  Marland Kitchen  History of Present Illness: 60  yowf last smoked 2003 with   COPD with minimum asthmatic component with an FEV1 of 55%, a ratio of 53%, and a diffusing capacity 51% documented 07/25/06.  On 02 24 hours per day at 2lpm  6/09  weighed 271 and it was felt she was as debilitated as much by obesity as by airflow obstruction. baseline = when well = walk to mother in law's house but stops at least once because it's uphill and frequently requiring rescue rx.  March 01, 2008--post hospitalization follow up. Admitted 02/23/08 for post op respiratory difficulties. she underwent  left Wrist surgery, post op dyspnea, cough/wheezing. Admitted w/ aggressive resp toilet and IV steroids. Discharged. 02/27/08 improved.     June 20, 2008 ov post hosp for [facility] req d/c cardioversion but no change in rx,  walking 30 min daily on 02 c/o increaese cough day = night, dry.  wt = 265   March 05, 2010-ov c/o  increased SOB with rest and activity x 4-5 days with associated wheezing. .  Ankles more  swollen. than usual. bnp 292    rec Prednisone taper over next week.  Extra Furosemide 40mg  once daily x 3days Low salt diet  Legs elevated.   page 2    March 10, 2010 ov Dyspnea- worse pulse low, legs swelling no better p prednisone and increasing lasix.  L post cp off and on x 3 days but not pleuritic,  no cough, no nausea.>> Admitted   March 19, 2010-- Post hospital visit. Admitted 2/28-03/16/10( dc wt at 269 which was about 10 lbs down) for Acute on chronic resp failure secondary to decompensated diastolic heart failure . She was tx with aggressive diuresis. Conintued on coumadin. INR yesterday was 2.4. 2 D echo showed EF 55% w/ gr 2 diastolic dysfunction . VQ scan with low probability for PE. Since discharge some better but still real weak. Cough and congestion  are some better. Edema worse this am, took with lasix  that helped.  no change in rx  March 27, 2010 ov wt no change from discharge though she feels more swollen,  no chest pain, doing wee but not sob with ex. very very poor insight into wt and fluid issues.  Was on lasix 40 with option for extra as needed and wasn't taking it so cards increased to 80 daily.  Pt denies any significant sore throat, dysphagia, itching, sneezing,  nasal congestion or excess secretions,  fever, chills, sweats, unintended wt loss, pleuritic or exertional cp, hempoptysis, change in activity tolerance  orthopnea pnd.  Pt also denies any obvious fluctuation in symptoms with weather or environmental change or other alleviating or aggravating factors.        Current Medications (verified): 1)  Furosemide 80 Mg Tabs (Furosemide) .... Take One Tablet By Mouth Two Times A Day. 2)  Coumadin 7.5 Mg Tabs (Warfarin Sodium) .... Take As Directed By Coumadin Clinic. 3)  Potassium Chloride Crys Cr 20 Meq Cr-Tabs (Potassium Chloride Crys Cr) .Marland Kitchen.. 1 Two Times A Day 4)  Fluoxetine Hcl 40 Mg Caps (Fluoxetine Hcl) .Marland Kitchen.. 1 Once Daily 5)  Omeprazole 20 Mg Cpdr (Omeprazole) .Marland Kitchen.. 1 By Mouth 30 Min Before 1st Meal 6)  Propafenone Hcl 325 Mg Xr12h-Cap (Propafenone Hcl) .... 2  Tab Daily 7)  Nasonex 50 Mcg/act Susp (Mometasone Furoate) .... 2 Puffs Each Nostril Two Times A Day 8)  Symbicort 160-4.5 Mcg/act  Aero (Budesonide-Formoterol Fumarate) .... Two Puffs Twice Daily 9)  Exforge 10-320 Mg  Tabs (Amlodipine Besylate-Valsartan) .Marland Kitchen.. 1tab By Mouth Once Daily 10)  Oxygen 2l/min .... Continuous 11)  Glucosamine-Chondroitin   Caps (Glucosamine-Chondroit-Vit C-Mn) .... Take 1 Capsule By Mouth Once A Day 12)  Calcium Carbonate-Vitamin D 600-400 Mg-Unit  Tabs (Calcium Carbonate-Vitamin D) .... Take 1 Tablet By Mouth Two Times A Day 13)  Vitamin D 1000 Unit Tabs (Cholecalciferol) .... Take 1 Tablet By Mouth Once A Day 14)  Xopenex 1.25 Mg/19ml  Nebu  (Levalbuterol Hcl) .... One Every 4 Hours As Needed 15)  Xopenex Hfa 45 Mcg/act  Aero (Levalbuterol Tartrate) .... Inhale 2 Puffs Every 4 Hours As Needed 16)  Tylenol Arthritis Pain 650 Mg Cr-Tabs (Acetaminophen) .... Per Bottle 17)  Mucinex Dm 30-600 Mg Xr12h-Tab (Dextromethorphan-Guaifenesin) .Marland Kitchen.. 1 Every 12 Hours As Needed W/ Flutter 18)  Tramadol Hcl 50 Mg  Tabs (Tramadol Hcl) .... Take 1 Tablet By Mouth Every 4 Hours As  Needed For Cough 19)  Chlor-Trimeton 4 Mg Tabs (Chlorpheniramine Maleate) .Marland Kitchen.. 1 Every 4 Hours As Needed 20)  Cvs Saline Nasal Spray 0.65 %  Soln (Saline) .... 2 Puffs Every 4 Hours As Needed 21)  Afrin Nasal Spray 0.05 % Soln (Oxymetazoline Hcl) .Marland Kitchen.. 1 Puff Two Times A Day X5days - Before Nasonex 22)  Prolia 60 Mg/ml Soln (Denosumab) .... Inject Once Every 6 Months  Allergies (verified): 1)  ! Pcn 2)  * Cymbalta  Past History:  Past Medical History: CHRONIC RHINITIS (ICD-472.0) EDEMA (ICD-782.3) ACUTE AND CHRONIC RESPIRATORY FAILURE (ICD-518.84) MORBID OBESITY (ICD-278.01)     - Target wt  =   179  for BMI < 30 peak wt 282     - Referred back to nutrition December 18, 2007  DEEP VENOUS THROMBOPHLEBITIS, HX OF (ICD-V12.52) COPD (ICD-496)..........................................................Marland KitchenWert     - PFTs  07/25/06 FEV1 55% ratio 53, DLC0 51%      - HFA  50% December 16, 2009 >  75% March 27, 2010  DEPRESSION (ICD-311) OSTEOPOROSIS (ICD-733.00) last BMD 4/08 -2.4, intolerant of bisphosphonates  HYPERTENSION (ICD-401.9) Atrial fibrillation - paroxysmal Anticoagulation therapy Pulmonary embolism, hx of neg stress myoview 10/07 Anxiety glucose intolerance - on steroids Hyperlipidemia Complex medical regimen    - Med calendar done 05/01/2008  Vital Signs:  Patient profile:   60 year old female Height:      65 inches Weight:      276 pounds BMI:     46.09 O2 Sat:      98 % on 2 L/minpulsed Temp:     97.8 degrees F oral Pulse rate:   82 / minute BP  sitting:   120 / 78  (left arm) Cuff size:   large  Vitals Entered By: Gweneth Dimitri RN (March 27, 2010 10:06 AM)  O2 Flow:  2 L/minpulsed  Serial Vital Signs/Assessments:  Comments: 1:11 PM Ambulatory Pulse Oximetry  Resting; HR__82___    02 Sat__96%2lpm pulsed___ Lap1 (185 feet)   HR__111___   02 Sat__93%2lpm pulsed___ Lap2 (185 feet)   HR__121___   02 Sat__90%2lpm pulsed___    Lap3 (185 feet)   HR_____   02 Sat_____  ___Test Completed without Difficulty _x__Test Stopped due to:Pt c/o increased SOB   By: Vernie Murders   CC: 3 wk follow up.  SOB - improved.  Comments Medications reviewed with patient Daytime contact number verified with patient. Gweneth Dimitri RN  March 27, 2010 10:06 AM    Physical Exam  Additional Exam:  Ambulatory healthy appearing in no acute distress. 280 February 07, 2008  > 274  December 16, 2009 >>273 03/05/10  > 278 March 10, 2010 >>277 March 19, 2010  > 276 March 27, 2010  HEENT: nl dentition, turbinates, and orophanx. Nl external ear canals without cough reflex Neck without JVD/Nodes/TM Lungs coarse BS w/ few rhonchi RRR no m/r/g , trace  edema  Abd soft and benign with limited excursion in the supine position.  Ext warm without calf tenderness, cyanosis clubbing  Skin warm and dry without lesions     Impression & Recommendations:  Problem # 1:  DYSPNEA (ICD-786.09)  Her updated medication list for this problem includes:    Furosemide 80 Mg Tabs (Furosemide) .Marland Kitchen... Take one tablet by mouth two times a day.    Symbicort 160-4.5 Mcg/act Aero (Budesonide-formoterol fumarate) .Marland Kitchen..Marland Kitchen Two puffs twice daily    Xopenex 1.25 Mg/25ml Nebu (Levalbuterol hcl) ..... One every 4 hours as needed    Xopenex Hfa 45 Mcg/act Aero (Levalbuterol tartrate) ..... Inhale 2 puffs every 4 hours as needed   Multifactorial but mostly wt related at this point in terms of further potential reversibility.  I had an extended discussion with the patient today  lasting 15 to 20 minutes of a 25 minute visit on the following issues:   Weight control is a matter of calorie balance which needs to be tilted in the pt's favor by eating less and exercising more.  Specifically, I recommended  exercise at a level where pt  is short of breath but not out of breath 30 minutes daily.  If not losing weight on this program, I would strongly recommend pt see a nutritionist with a food diary recorded for two weeks prior to the visit.     Orders: Est. Patient Level IV (16109)  Problem # 2:  COPD (ICD-496) I spent extra time with the patient today explaining optimal mdi  technique.  This improved from  50-75% with coaching, limited by relatively small insp capacity due to effects of wt  Problem # 3:  ACUTE AND CHRONIC RESPIRATORY FAILURE (ICD-518.84)  02 rx reviewed, appropriate  Orders: Est. Patient Level IV (99214) Pulse Oximetry, Ambulatory (60454)  Problem # 4:  EDEMA (ICD-782.3)  Each maintenance medication was reviewed in detail including most importantly the difference between maintenance and as needed and under what circumstances the prns are to be used. This was done in the context of a medication calendar review which provided the patient with a user-friendly unambiguous mechanism for medication administration and reconciliation and provides an action plan for all active problems. It is critical that this be shown to every doctor  for modification during the office visit if necessary so the patient can use it as a working document.   Orders: Est. Patient Level IV (09811)  Patient Instructions: 1)  Work on inhaler technique:  relax and blow all the way out then take a nice smooth deep breath back in, triggering the inhaler at same time you start breathing in  2)  See calendar for specific medication instructions and bring it back for each and every office visit for every healthcare provider you see.  Without it,  you may not receive the best quality  medical care that we feel you deserve.  3)  Weight control is  simply a matter of calorie balance which needs to be tilted in your favor by eating less and exercising more.  To get the most out of exercise, you need to be continuously aware that you are short of breath, but never out of breath, for 30 minutes daily. As you improve, it will actually be easier for you to do the same amount in  30 minutes so always push to the level where you are short of breath.  If this does not result in gradual weight reduction,  I recommend  a nutritionist for a food diary 4)  Return to office in 3 months, sooner if needed

## 2010-03-31 NOTE — Medication Information (Signed)
Summary: rov/tm  Anticoagulant Therapy  Managed by: Cloyde Reams, RN, BSN Referring MD: Valera Castle MD PCP: Corwin Levins MD Supervising MD: Mariah Milling Indication 1: Pulmonary embolism (ICD-415.19) Lab Used: [facility] Site: [place] INR POC 1.9 INR RANGE 2 - 3  Dietary changes: no    Health status changes: no    Bleeding/hemorrhagic complications: no    Recent/future hospitalizations: no    Any changes in medication regimen? yes       Details: Continue on Avelox, 2 doses left.   Recent/future dental: no  Any missed doses?: no       Is patient compliant with meds? yes       Allergies: 1)  ! Pcn 2)  * Cymbalta  Anticoagulation Management History:      The patient is taking warfarin and comes in today for a routine follow up visit.  Negative risk factors for bleeding include an age less than 39 years old.  The bleeding index is 'low risk'.  Positive CHADS2 values include History of HTN.  Negative CHADS2 values include Age > 33 years old.  The start date was 06/13/2008.  Her last INR was 5.6.  Anticoagulation responsible provider: Seth Friedlander.  INR POC: 1.9.  Cuvette Lot#: [id number].  Exp: 01/2011.    Anticoagulation Management Assessment/Plan:      The patient's current anticoagulation dose is Coumadin 7.5 mg tabs: Take as directed by coumadin clinic..  The target INR is 2 - 3.  The next INR is due 04/22/2010.  Anticoagulation instructions were given to patient.  Results were reviewed/authorized by Cloyde Reams, RN, BSN.  She was notified by Cloyde Reams RN.         Prior Anticoagulation Instructions: INR 2.4 Take 7.5mg s  daily, which is one pill until you finish Avelox.  Recheck in one week.   Current Anticoagulation Instructions: INR 1.9  Take 1.5 tablets today, then resume same dosage 1 tablet daily except 1.5 tablets on Tuesdays and Saturdays.  Recheck in 4 weeks.

## 2010-03-31 NOTE — Discharge Summary (Signed)
[name] [name], [name] [name]                ACCOUNT NO.:  [id number]  MEDICAL RECORD NO.:  [id number]           PATIENT TYPE:  I  LOCATION:  3729                         FACILITY:  [facility]  PHYSICIAN:  Oley Balm. Sung Amabile, MD   DATE OF BIRTH:  28-Jul-1950  DATE OF ADMISSION:  03/10/2010 DATE OF DISCHARGE:  03/16/2010                              DISCHARGE SUMMARY   FINAL DIAGNOSES:  Acute-on-chronic respiratory failure, multifactorial, secondary to decompensated diastolic heart failure superimposed on underlying morbid obesity, and chronic obstructive pulmonary disease.  DISCHARGE DIAGNOSES: 1. Acute on chronic respiratory failure, multifactorial acutely     secondary to decompensated diastolic heart failure superimposed on     underlying morbid obesity and chronic obstructive pulmonary     disease. 2. History of atrial arrhythmia/atrial fibrillation. 3. Ventricular bigeminy. 4. Abdominal bloating. 5. Sinusitis. 6. Remote deep vein thrombosis/pulmonary emboli.  CONSULTANTS:  Dr. Daleen Squibb with [facility].  PROCEDURES:  None.  DIAGNOSTICS:  A 2-D echocardiogram obtained on March 11, 2010.  This demonstrated ejection fraction to be 55%.  Features were consistent with pseudo-normal left ventricular filling pressure, with a concomitant abnormal relaxation and increased filling pressure interpreted it as grade 2 diastolic dysfunction.  LABORATORY DATA: 1. Date, March 16, 2010, sodium 141, potassium 4.1, chloride 104, CO2     of 31, glucose 100, BUN 12, creatinine 0.86, PT/INR is 32.0/3.10,     this was on March 16, 2010. 2. March 15, 2010, PT was 29, INR was 2.73.  March 14, 2010, PT was     29.9, INR was 2.84, lipase on March 12, 2010, was 30.  Additional     radiological evaluation nuclear medicine perfusion scan was done on     March 12, 2010, demonstrated low probability for pulmonary emboli.  BRIEF HISTORY:  This is a very pleasant 60 year old white female who presented to the office  initially on March 05, 2010, reporting progressive dyspnea with associated wheezing as well as increased lower extremity swelling.  She was treated with prednisone taper, and extra Lasix x3 days, additionally was encouraged to follow low-salt diet and elevate her legs.  She reported again to our office on March 10, 2010, for a followup visit reporting her swelling was worse in her lower extremities, she was having occasional off and on chest pain and progressive dyspnea.  She was therefore admitted for further evaluation and therapy.  HOSPITAL COURSE BY DISCHARGE DIAGNOSES: 1. Acute on chronic respiratory failure secondary to decompensated     diastolic heart failure, superimposed on obesity hypoventilation     syndrome, and chronic obstructive pulmonary disease.  [name]     was admitted to regular medical ward.  Therapeutic interventions     included supplemental oxygen, bronchodilators, and diuresis.     Diuresis was a primary modality of her improvement and she has     responded nicely to escalation in her diuretic regimen.     Ultimately, Cardiology consultation was obtained.  It was felt this     decompensation was primarily secondary to her diastolic     dysfunction.  It was not felt that  she was having ischemia, and     recommendation for diuretics were made.  Ultimately, her     antiarrhythmic regimen was continued, her Coumadin was continued,     her typical bronchodilator regimen was maintained, supplemental     oxygen was maintained, and the primary therapy was aggressive IV     diuresis.  She continued to improve from a pulmonary standpoint     over the course of her hospitalization with a new diuretic regimen     now established.  She will be discharged to home from pulmonary     standpoint with recommendations to continue supplemental oxygen at     all times, continue her new diuretic regimen which is down 80 mg of     Lasix scheduled versus 40 mg daily.   Additionally, she will     continue her typical bronchodilator regimen which is Symbicort, and     followup with [place] in the office in the outpatient setting. 2. Atrial fibrillation, with episodic episodes of bigeminy while in     the hospital.  No specific interventions were made.     Recommendations from Cardiology were to continue her Rythmol, and     she will remain on this in the outpatient setting in addition to     anticoagulation. 3. Sinusitis.  [name] did develop nasal congestion, and cough     productive of brown, purulent sputum on February 15, 2010.  She     reports overall her breathing was actually better, however,     postnasal drip was worse.  It was discussed with Ms. Sardinas,     whether or not she would benefit from an additional day in the     hospital, Ms. Henthorn felt that she would convalesce quicker at     home.  She cited several reasons, primarily inadequate rest and     frustration with timing of assistance while in the inpatient     setting as her reason for desire to be discharged.  Ultimately,     because we often due to treat sinusitis in the outpatient setting,     it was felt she could safely be discharged to home with close     outpatient monitoring.  We discussed reasons to call the office,     and set up a close outpatient monitoring, followup with our nurse     practitioner in the office.  Ultimately, she will be discharged to     home with recommendations to embark on an aggressive nasal hygiene     regimen, and continue a 10-day course of Avelox.  Given the fact     that she is on anticoagulation in the form of Coumadin, and the     concern of quinolone interaction, I have asked that she hold her     Coumadin for the next 2 days given her INR is already over 3.  I     will have her followup at the [facility] for     further dosing instructions in regard to her Coumadin.  DISCHARGE INSTRUCTIONS:  Diet as tolerated.  Instructed to  follow a no added salt diet.  DISCHARGE MEDICATIONS: 1. Avelox 400 mg daily for 10 days. 2. Nasonex 2 sprays b.i.d. each naris x4 days then back to daily as     needed. 3. Nasal saline 2 sprays each naris four times a day. 4. Oxygen 2 liters at all times. 5. Furosemide  80 mg daily. 6. K-Dur 20 mEq 1 tablet twice a day. 7. Afrin 1 puff each naris twice a day for 4 days, then discontinue.     She is instructed to use this after nasal saline irrigation. 8. Chlor-Trimeton 4 mg every 4 hours as needed for runny nose. 9. Calcium carbonate/vitamin D one tab twice a day. 10.Coumadin 7.5 mg, she takes 5 mg every day except Saturday and     Wednesday at which time she takes 7.5 mg. 11.Exforge 10/320 one tab daily. 12.Prozac 40 mg daily. 13.Glucosamine/chondroitin 1 capsule daily. 14.Mucinex XR 600 mg every 12 hours. 15.Omeprazole 20 mg daily before breakfast. 16.Rythmol SR 325 mg twice a day. 17.Symbicort 160/4.5 two puffs twice a day. 18.Tramadol 50 mg p.r.n. 1-2 tablets as needed every 4 hours. 19.Tylenol 650 mg twice a day as needed. 20.Vitamin D 1000 units daily. 21.Xopenex one nebulized p.r.n. 22.Xopenex 2 puffs via HFA every 4 hours as needed.  DISPOSITION:  Ms. Gilvin has maximum benefit from inpatient stay.  She will be discharged to home with recommendations to follow with our nurse practitioner, Rubye Oaks on March 19, 2010 at 2 p.m.  Additionally, she will be seen in the [facility] on March 7,. 2012, for further INR dosing.  [facility] will follow as well with their office making a followup appointment.     Zenia Resides, NP   ______________________________ Oley Balm. Sung Amabile, MD    PB/MEDQ  D:  03/16/2010  T:  03/17/2010  Job:  [id number]  Electronically Signed by Zenia Resides NP on 03/30/2010 10:06:48 PM Electronically Signed by Billy Fischer MD on 03/31/2010 11:19:30 AM

## 2010-04-07 ENCOUNTER — Encounter: Payer: Self-pay | Admitting: Cardiology

## 2010-04-07 DIAGNOSIS — I4892 Unspecified atrial flutter: Secondary | ICD-10-CM

## 2010-04-07 DIAGNOSIS — Z8672 Personal history of thrombophlebitis: Secondary | ICD-10-CM

## 2010-04-07 DIAGNOSIS — I4891 Unspecified atrial fibrillation: Secondary | ICD-10-CM

## 2010-04-07 DIAGNOSIS — Z86718 Personal history of other venous thrombosis and embolism: Secondary | ICD-10-CM

## 2010-04-08 ENCOUNTER — Other Ambulatory Visit: Payer: Self-pay | Admitting: *Deleted

## 2010-04-08 ENCOUNTER — Other Ambulatory Visit: Payer: Self-pay

## 2010-04-08 DIAGNOSIS — E7800 Pure hypercholesterolemia, unspecified: Secondary | ICD-10-CM

## 2010-04-09 NOTE — Assessment & Plan Note (Signed)
Summary: post hospital/AMD   Visit Type:  Initial Consult Primary Provider:  Corwin Levins MD  CC:  Former Dr. Daleen Squibb patient.  Follow [facility] for CHF.Marland Kitchen  History of Present Illness: [name] [name] is a pleasant 60 year old woman with a history of smoking for 35 years, stopped 10 years ago, history of atrial fibrillation dating back to 2010,  history of PE on chronic Coumadin, recent hospitalization for acute on chronic respiratory failure felt secondary to diastolic dysfunction, morbid obesity and COPD. She presents for routine followup.   notes indicate she was admitted to the hospital on October 28. She had diuresis, oxygen, bronchodilators. She had atrial fibrillation while in the hospital, maintained on Rythmol. Discharged on March 5. During this period she lost 10 pounds. She was given Avelox for sinusitis.  since her discharge, she reports that she has started feeling more short of breath again with mild swelling. She does not feel that her current Lasix dose is adequate.  She denies palpitations or symptoms of atrial fib. She wears oxygen 24 7 and has chronic dyspnea.  Echocardiogram March 10 2010 shows normal systolic function, diastolic dysfunction, mild MR, elevated right ventricular systolic pressure consistent with mild pulmonary hypertension  cardiac catheter in 2003 showing no significant coronary artery disease  Current Medications (verified): 1)  Furosemide 40 Mg  Tabs (Furosemide) .... 2 Tabs By Mouth Once Daily 2)  Coumadin 7.5 Mg Tabs (Warfarin Sodium) .... Take As Directed By Coumadin Clinic. 3)  Potassium Chloride Crys Cr 20 Meq Cr-Tabs (Potassium Chloride Crys Cr) .Marland Kitchen.. 1 Two Times A Day 4)  Fluoxetine Hcl 40 Mg Caps (Fluoxetine Hcl) .Marland Kitchen.. 1 Once Daily 5)  Omeprazole 20 Mg Cpdr (Omeprazole) .Marland Kitchen.. 1 By Mouth 30 Min Before 1st Meal 6)  Propafenone Hcl 325 Mg Xr12h-Cap (Propafenone Hcl) .... 2 Tab Daily 7)  Nasonex 50 Mcg/act Susp (Mometasone Furoate) .... 2 Puffs Each  Nostril Two Times A Day 8)  Symbicort 160-4.5 Mcg/act  Aero (Budesonide-Formoterol Fumarate) .... Two Puffs Twice Daily 9)  Exforge 10-320 Mg  Tabs (Amlodipine Besylate-Valsartan) .Marland Kitchen.. 1tab By Mouth Once Daily 10)  Oxygen 2l/min .... Continuous 11)  Glucosamine-Chondroitin   Caps (Glucosamine-Chondroit-Vit C-Mn) .... Take 1 Capsule By Mouth Once A Day 12)  Calcium Carbonate-Vitamin D 600-400 Mg-Unit  Tabs (Calcium Carbonate-Vitamin D) .... Take 1 Tablet By Mouth Two Times A Day 13)  Vitamin D 1000 Unit Tabs (Cholecalciferol) .... Take 1 Tablet By Mouth Once A Day 14)  Xopenex 1.25 Mg/72ml  Nebu (Levalbuterol Hcl) .... One Every 4 Hours As Needed 15)  Xopenex Hfa 45 Mcg/act  Aero (Levalbuterol Tartrate) .... Inhale 2 Puffs Every 4 Hours As Needed 16)  Tylenol Arthritis Pain 650 Mg Cr-Tabs (Acetaminophen) .... Per Bottle 17)  Mucinex Dm 30-600 Mg Xr12h-Tab (Dextromethorphan-Guaifenesin) .Marland Kitchen.. 1 Every 12 Hours As Needed W/ Flutter 18)  Tramadol Hcl 50 Mg  Tabs (Tramadol Hcl) .... Take 1 Tablet By Mouth Every 4 Hours As  Needed For Cough 19)  Chlor-Trimeton 4 Mg Tabs (Chlorpheniramine Maleate) .Marland Kitchen.. 1 Every 4 Hours As Needed 20)  Cvs Saline Nasal Spray 0.65 %  Soln (Saline) .... 2 Puffs Every 4 Hours As Needed 21)  Afrin Nasal Spray 0.05 % Soln (Oxymetazoline Hcl) .Marland Kitchen.. 1 Puff Two Times A Day X5days - Before Nasonex 22)  Prolia 60 Mg/ml Soln (Denosumab) .... Inject Once Every 6 Months 23)  Saline Nasal Spray 0.65 % Soln (Saline) .... 2 Spray Each Nostril Four Times A Day As Needed  Allergies (verified): 1)  ! Pcn 2)  * Cymbalta  Past History:  Past Medical History: Last updated: 03/10/2010 CHRONIC RHINITIS (ICD-472.0) EDEMA (ICD-782.3) ACUTE AND CHRONIC RESPIRATORY FAILURE (ICD-518.84) MORBID OBESITY (ICD-278.01)     - Target wt  =   179  for BMI < 30 peak wt 282     - Referred back to nutrition December 18, 2007  DEEP VENOUS THROMBOPHLEBITIS, HX OF (ICD-V12.52) COPD  (ICD-496)..........................................................Marland KitchenWert     - PFTs  07/25/06 FEV1 55% ratio 53, DLC0 51%      - HFA  50% December 16, 2009  DEPRESSION (ICD-311) OSTEOPOROSIS (ICD-733.00) last BMD 4/08 -2.4, intolerant of bisphosphonates  HYPERTENSION (ICD-401.9) Atrial fibrillation - paroxysmal Anticoagulation therapy Pulmonary embolism, hx of neg stress myoview 10/07 Anxiety glucose intolerance - on steroids Hyperlipidemia Complex medical regimen    - Med calendar done 05/01/2008  Past Surgical History: Last updated: 04/15/2008 Hysterectomy Tonsillectomy Skin Graft to midde R finger-1975 s/p left arm fracture with fall off chair  Family History: Last updated: 07/07/2007 asthma and brothers and son.   Heart disease in mother.   Throat cancer in brother.  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  The patient denies fever, weight loss, weight gain, vision loss, decreased hearing, hoarseness, chest pain, syncope, prolonged cough, abdominal pain, incontinence, muscle weakness, depression, and enlarged lymph nodes.    Vital Signs:  Patient profile:   60 year old female Height:      65 inches Weight:      274 pounds BMI:     45.76 Pulse rate:   86 / minute BP sitting:   124 / 84  (left arm) Cuff size:   large  Vitals Entered By: Bishop Dublin, CMA (March 26, 2010 11:05 AM)  Physical Exam  General:  alert and overweight-appearing.  Head:  normocephalic and atraumatic.   Neck:  supple and cervical lymphadenopathy.   Lungs:  normal respiratory effort and normal breath sounds.   Heart:  Non-displaced PMI, chest non-tender; regular rate and rhythm,  S1, S2 without murmurs, rubs or gallops. Carotid upstroke normal, no bruit.  Pedals normal pulses. Trace edema b/l LE, no varicosities. Abdomen:  soft, non-tender, and normal bowel sounds.   Msk:  Back normal, normal gait. Muscle strength and tone normal. Pulses:  pulses normal in all 4 extremities Extremities:  No clubbing or cyanosis. Neurologic:  Alert and oriented x 3. Skin:  Intact without lesions or rashes. Psych:  Normal affect.   Impression & Recommendations:  Problem # 1:  DYSPNEA (ICD-786.09) recent diastolic heart failure with admission to the hospital. We'll increase her Lasix to 80 mg b.i.d. with potassium 20 mg b.i.d. Close followup in clinic. I asked her to contact us if her shortness of breath does not improve.  Her updated medication list for this problem includes:    Furosemide 80 Mg Tabs (Furosemide) .Marland Kitchen... Take one tablet by mouth two times a day.    Exforge 10-320 Mg Tabs (Amlodipine besylate-valsartan) .Marland Kitchen... 1tab by mouth once daily  Problem # 2:  ATRIAL FLUTTER (ICD-427.32) Sinus rhythm today. Continue her current medications.  Her updated medication list for this problem includes:    Coumadin 7.5 Mg  Tabs (Warfarin sodium) .Marland Kitchen... Take as directed by coumadin clinic.    Propafenone Hcl 325 Mg Xr12h-cap (Propafenone hcl) .Marland Kitchen... 2 tab daily  Problem # 3:  HYPERLIPIDEMIA (ICD-272.4) Known history of coronary artery disease by previous catheterization. We'll monitor her cholesterol for now. Encouraged weight loss and diet.  Problem # 4:  HYPERTENSION (ICD-401.9) blood pressure is well-controlled on her current medication regimen. No changes made.  Her updated medication list for this problem includes:    Furosemide 80 Mg Tabs (Furosemide) .Marland Kitchen... Take one tablet by mouth two times a day.    Exforge 10-320 Mg Tabs (Amlodipine besylate-valsartan) .Marland Kitchen... 1tab by mouth once daily  Problem # 5:  COPD (ICD-496) Continue aggressive regimen of inhalers.  Her updated  medication list for this problem includes:    Symbicort 160-4.5 Mcg/act Aero (Budesonide-formoterol fumarate) .Marland Kitchen..Marland Kitchen Two puffs twice daily    Xopenex 1.25 Mg/59ml Nebu (Levalbuterol hcl) ..... One every 4 hours as needed    Xopenex Hfa 45 Mcg/act Aero (Levalbuterol tartrate) ..... Inhale 2 puffs every 4 hours as needed  Problem # 6:  MORBID OBESITY (ICD-278.01) Encouraged weight loss, diet, increase exercise as tolerated.  Patient Instructions: 1)  Your physician recommends that you schedule a follow-up appointment in: 3 months 2)  Your physician recommends that you continue on your current medications as directed. Please refer to the Current Medication list given to you today. Prescriptions: POTASSIUM CHLORIDE CRYS CR 20 MEQ CR-TABS (POTASSIUM CHLORIDE CRYS CR) 1 two times a day  #180 x 3   Entered by:   Lanny Hurst RN   Authorized by:   Dossie Arbour MD   Signed by:   Lanny Hurst RN on 03/26/2010   Method used:   Faxed to ...       [facility] (mail-order)             , [place]         Ph: [phone]       Fax: [phone]   RxID:   [id number] FUROSEMIDE 80 MG TABS (FUROSEMIDE) Take one tablet by mouth two times a day.  #180 x 3   Entered by:   Lanny Hurst RN   Authorized by:   Dossie Arbour MD   Signed by:   Lanny Hurst RN on 03/26/2010   Method used:   Faxed to ...       [facility] (mail-order)             , [place]         Ph: [phone]       Fax: [phone]   RxID:   [id number]

## 2010-04-10 ENCOUNTER — Other Ambulatory Visit (INDEPENDENT_AMBULATORY_CARE_PROVIDER_SITE_OTHER): Payer: BC Managed Care – PPO | Admitting: Internal Medicine

## 2010-04-10 ENCOUNTER — Other Ambulatory Visit (INDEPENDENT_AMBULATORY_CARE_PROVIDER_SITE_OTHER): Payer: BC Managed Care – PPO

## 2010-04-10 DIAGNOSIS — E7800 Pure hypercholesterolemia, unspecified: Secondary | ICD-10-CM

## 2010-04-10 DIAGNOSIS — R710 Precipitous drop in hematocrit: Secondary | ICD-10-CM

## 2010-04-10 DIAGNOSIS — Z13220 Encounter for screening for lipoid disorders: Secondary | ICD-10-CM

## 2010-04-10 DIAGNOSIS — R7309 Other abnormal glucose: Secondary | ICD-10-CM

## 2010-04-10 DIAGNOSIS — R718 Other abnormality of red blood cells: Secondary | ICD-10-CM

## 2010-04-10 LAB — BASIC METABOLIC PANEL
BUN: 12 mg/dL (ref 6–23)
CO2: 34 mEq/L — ABNORMAL HIGH (ref 19–32)
Calcium: 8.9 mg/dL (ref 8.4–10.5)
Chloride: 104 mEq/L (ref 96–112)
Creatinine, Ser: 1 mg/dL (ref 0.4–1.2)
GFR: 58.12 mL/min — ABNORMAL LOW (ref >60.00)
Glucose, Bld: 106 mg/dL — ABNORMAL HIGH (ref 70–99)
Potassium: 3.9 mEq/L (ref 3.5–5.1)
Sodium: 143 mEq/L (ref 135–145)

## 2010-04-10 LAB — LIPID PANEL
Cholesterol: 201 mg/dL — ABNORMAL HIGH (ref 0–200)
HDL: 39.3 mg/dL (ref >39.00)
Total CHOL/HDL Ratio: 5
Triglycerides: 121 mg/dL (ref 0.0–149.0)
VLDL: 24.2 mg/dL (ref 0.0–40.0)

## 2010-04-10 LAB — LDL CHOLESTEROL, DIRECT: Direct LDL: 140.2 mg/dL

## 2010-04-10 LAB — HEMOGLOBIN A1C: Hgb A1c MFr Bld: 4.6 % (ref 4.6–6.5)

## 2010-04-13 DIAGNOSIS — I4891 Unspecified atrial fibrillation: Secondary | ICD-10-CM

## 2010-04-15 ENCOUNTER — Ambulatory Visit (INDEPENDENT_AMBULATORY_CARE_PROVIDER_SITE_OTHER): Payer: BC Managed Care – PPO | Admitting: Internal Medicine

## 2010-04-15 ENCOUNTER — Encounter: Payer: Self-pay | Admitting: Internal Medicine

## 2010-04-15 ENCOUNTER — Other Ambulatory Visit (INDEPENDENT_AMBULATORY_CARE_PROVIDER_SITE_OTHER): Payer: BC Managed Care – PPO

## 2010-04-15 VITALS — BP 132/78 | HR 68 | Temp 98.1°F | Ht 64.0 in | Wt 271.2 lb

## 2010-04-15 DIAGNOSIS — I10 Essential (primary) hypertension: Secondary | ICD-10-CM

## 2010-04-15 DIAGNOSIS — Z0001 Encounter for general adult medical examination with abnormal findings: Secondary | ICD-10-CM | POA: Insufficient documentation

## 2010-04-15 DIAGNOSIS — R7309 Other abnormal glucose: Secondary | ICD-10-CM

## 2010-04-15 DIAGNOSIS — J310 Chronic rhinitis: Secondary | ICD-10-CM | POA: Insufficient documentation

## 2010-04-15 DIAGNOSIS — E785 Hyperlipidemia, unspecified: Secondary | ICD-10-CM

## 2010-04-15 DIAGNOSIS — Z0000 Encounter for general adult medical examination without abnormal findings: Secondary | ICD-10-CM

## 2010-04-15 DIAGNOSIS — R1011 Right upper quadrant pain: Secondary | ICD-10-CM

## 2010-04-15 DIAGNOSIS — R7302 Impaired glucose tolerance (oral): Secondary | ICD-10-CM | POA: Insufficient documentation

## 2010-04-15 LAB — HEPATIC FUNCTION PANEL
ALT: 20 U/L (ref 0–35)
AST: 18 U/L (ref 0–37)
Albumin: 3.8 g/dL (ref 3.5–5.2)
Alkaline Phosphatase: 61 U/L (ref 39–117)
Bilirubin, Direct: 0.1 mg/dL (ref 0.0–0.3)
Total Bilirubin: 0.9 mg/dL (ref 0.3–1.2)
Total Protein: 6.5 g/dL (ref 6.0–8.3)

## 2010-04-15 MED ORDER — ATORVASTATIN CALCIUM 10 MG PO TABS: 10.0000 mg | ORAL_TABLET | Freq: Every day | ORAL | Status: DC

## 2010-04-15 NOTE — Assessment & Plan Note (Signed)
Persistent uncontrolled, with goal ldl < 100 at least;  To start lipitor 10 mg per day, f/u labs next visit

## 2010-04-15 NOTE — Progress Notes (Signed)
Subjective:    Patient ID: [name] [name], female    DOB: 13-Sep-1950, 60 y.o.   MRN: [id number]  HPI  Here with c/o "gas pains"  With pain to the RUQ/right lateral side/lower rib cage area x 2 days, mild to mod, intermittent, dull, and she wonders if assoc with increased flatus in the past month;  No increased reflux, n/v, other bowel change, or blood.  Did try a stool softner 6 days ago, and seemed to make it worse.  Eating does not make it worse, but twisting at the waist can make worse, but not really palpation of the area.   Pt denies fever, wt loss, night sweats, loss of appetite, or other constitutional symptoms.  Pt denies other chest pain, increased sob or doe, wheezing, orthopnea, PND, increased LE swelling, palpitations, dizziness or syncope, better overall since increased diuretic recently with increased K as well. Still has GB intact, but is s/p TAH BSO, also known small umblical hernia chronic. Pt denies new neurological symptoms such as new headache, or facial or extremity weakness or numbness  Pt denies polydipsia, polyuria, or low sugar symptoms. Pt states overall good compliance with meds, trying to follow lower cholesterol, diabetic diet, wt overall stable but little exercise however.  CBG's doing well.  Past Medical History  Diagnosis Date  . Chronic rhinitis   . Edema   . Acute and chronic respiratory failure   . Morbid obesity     target wt=179lb for BMI<30 Peak wt 282lb  . DVT (deep venous thrombosis)   . COPD (chronic obstructive pulmonary disease)     Wert. PFTs 07/25/06 FEV1 55% ratio 53, DLC0 51% HFA 50% 12/16/2009  . Depression   . Osteoporosis     last BMD 4/08 -2.4, intolerant of bisphosphonates  . Hypertension   . Arrhythmia     atrial fibrillation, anticoagulation therapy  . Pulmonary embolism   . Anxiety   . Glucose intolerance (impaired glucose tolerance)     on steroids  . Hyperlipidemia   . VITAMIN D DEFICIENCY 10/27/2007  . HYPERLIPIDEMIA 06/19/2007  .  PREMATURE VENTRICULAR CONTRACTIONS 10/23/2008  . Atrial flutter 07/01/2008  . Obstructive chronic bronchitis with exacerbation 01/19/2007  . PNEUMONIA ORGANISM NOS 01/09/2008  . UTI 08/06/2008  . WRIST PAIN, LEFT 11/27/2009  . ANKLE PAIN 03/28/2008  . Rhabdomyolysis 07/29/2009  . CRAMP IN LIMB 07/29/2009  . OSTEOPOROSIS 07/29/2006  . CHEST PAIN 09/16/2009  . Dysuria 07/22/2008  . FRACTURE, RIB, RIGHT 06/18/2009  . PULMONARY EMBOLISM, HX OF 06/19/2007  . DEEP VENOUS THROMBOPHLEBITIS, HX OF 07/29/2006  . DYSPNEA 03/10/2010   Past Surgical History  Procedure Date  . Abdominal hysterectomy   . Tonsillectomy   . Skin graft to middle r finger 1975  . S/p left arm fracture with fall off chair     reports that she quit smoking about 12 months ago. She does not have any smokeless tobacco history on file. She reports that she does not drink alcohol or use illicit drugs. family history includes Asthma in her brother and son; Heart disease in her mother; and Throat cancer in her brother. Allergies  Allergen Reactions  . Duloxetine     REACTION: rhabdomyolysis  . Penicillins     Current Outpatient Prescriptions on File Prior to Visit  Medication Sig Dispense Refill  . acetaminophen (TYLENOL ARTHRITIS PAIN) 650 MG CR tablet Take 650 mg by mouth every 8 (eight) hours as needed.        Marland Kitchen amLODipine-valsartan (  EXFORGE) 10-320 MG per tablet Take 1 tablet by mouth daily.        . budesonide-formoterol (SYMBICORT) 160-4.5 MCG/ACT inhaler Inhale 2 puffs into the lungs 2 (two) times daily.        . Calcium Carbonate-Vitamin D 600-400 MG-UNIT per tablet Take 1 tablet by mouth 2 (two) times daily.        . chlorpheniramine (CHLOR-TRIMETON) 4 MG tablet Take 4 mg by mouth every 4 (four) hours as needed.        . cholecalciferol (VITAMIN D) 1000 UNITS tablet Take 1,000 Units by mouth daily.        Marland Kitchen denosumab (PROLIA) 60 MG/ML SOLN Inject 60 mg into the skin once. Inject once every 6 months       .  dextromethorphan-guaifenesin (MUCINEX DM) 30-600 MG per 12 hr tablet Take 1 tablet by mouth. 1 every 12 hours as needed w/flutter       . FLUoxetine (PROZAC) 40 MG capsule Take 40 mg by mouth daily.        . furosemide (LASIX) 40 MG tablet Take 40 mg by mouth. Take 1 table by mouth once a day and 1 extra once daily as needed       . glucosamine-chondroitin 500-400 MG tablet Take 1 tablet by mouth daily.        Marland Kitchen levalbuterol (XOPENEX HFA) 45 MCG/ACT inhaler Inhale 1-2 puffs into the lungs every 4 (four) hours as needed.        . levalbuterol (XOPENEX) 1.25 MG/3ML nebulizer solution Take 1 ampule by nebulization every 4 (four) hours as needed.        Marland Kitchen levofloxacin (LEVAQUIN) 750 MG tablet Take 750 mg by mouth. One tablet by mouth daily for 5 days       . mometasone (NASONEX) 50 MCG/ACT nasal spray 2 sprays by Nasal route 2 (two) times daily.        Marland Kitchen omeprazole (PRILOSEC) 20 MG capsule Take 20 mg by mouth. 1 by mouth 30 min before 1st meal       . oxymetazoline (AFRIN) 0.05 % nasal spray 2 sprays by Nasal route 2 (two) times daily. For 5 days before nasonex       . potassium chloride (KLOR-CON 10) 10 MEQ CR tablet Take 10 mEq by mouth 2 (two) times daily.        . propafenone (RYTHMOL SR) 325 MG 12 hr capsule Take 325 mg by mouth 2 (two) times daily.        Marland Kitchen tiotropium (SPIRIVA) 18 MCG inhalation capsule Place 18 mcg into inhaler and inhale. Inhale contents of 1 capsule once a day       . traMADol (ULTRAM) 50 MG tablet Take 50 mg by mouth every 4 (four) hours as needed. For cough       . warfarin (COUMADIN) 7.5 MG tablet Take 7.5 mg by mouth. Take as directed by coumadin clinic       . Saline (SODIUM CHLORIDE) 0.65 % SOLN by Nasal route. 2 puffs every 4 hours as needed         Review of Systems Review of Systems  Constitutional: Negative for diaphoresis and unexpected weight change.  HENT: Negative for drooling and tinnitus.   Eyes: Negative for photophobia and visual disturbance.    Respiratory: Negative for choking and stridor.   Gastrointestinal: Negative for vomiting and blood in stool.  Genitourinary: Negative for hematuria and decreased urine volume.  Musculoskeletal: Negative for gait problem.  Skin:  Negative for color change and wound.  Neurological: Negative for tremors and numbness.  Psychiatric/Behavioral: Negative for decreased concentration. The patient is not hyperactive.       Objective:   Physical Exam BP 132/78  Pulse 68  Temp(Src) 98.1 F (36.7 C) (Oral)  Ht 5\' 4"  (1.626 m)  Wt 271 lb 4 oz (123.038 kg)  BMI 46.56 kg/m2  SpO2 97% Physical Exam  VS noted Constitutional: Pt appears well-developed and well-nourished.  HENT: Head: Normocephalic.  Right Ear: External ear normal.  Left Ear: External ear normal.  Eyes: Conjunctivae and EOM are normal. Pupils are equal, round, and reactive to light.  Neck: Normal range of motion. Neck supple.  Cardiovascular: Normal rate and regular rhythm.   Pulmonary/Chest: Effort normal and breath sounds decreased bilat, no wheezing  Abd:  Soft, NT, non-distended, + BS, except does have some tender to RUQ and lateral right side to the mid axillary line  Neurological: Pt is alert. No cranial nerve deficit.  Skin: Skin is warm. No erythema.  Psychiatric: Pt behavior is normal. Thought content normal. 1+ nervous         Assessment & Plan:

## 2010-04-15 NOTE — Assessment & Plan Note (Signed)
stable overall by hx and exam, most recent lab reviewed with pt, and pt to continue medical treatment as before  - no meds needed at this time., to cont diet and wt loss efforts

## 2010-04-15 NOTE — Progress Notes (Signed)
Quick Note:  Voice message left on [employer] system - lab is negative, normal or otherwise stable, pt to continue same tx ______ 

## 2010-04-15 NOTE — Assessment & Plan Note (Signed)
Mild, ? MSK, will check LFT's and abd u/s, but if neg can follow clinically with expectant management

## 2010-04-15 NOTE — Patient Instructions (Addendum)
Take all new medications as prescribed Continue all other medications as before Please go to LAB in the Basement for the blood and/or urine tests to be done today You will be contacted regarding the referral for: ultrasound for the gallbladder Please return in 6 mo with Lab testing done 3-5 days before

## 2010-04-15 NOTE — Assessment & Plan Note (Signed)
stable overall by hx and exam, most recent lab reviewed with pt, and pt to continue medical treatment as before  Lab Results  Component Value Date   WBC 9.1 03/10/2010   HGB 11.6* 03/10/2010   HCT 35.9* 03/10/2010   PLT 237 03/10/2010   CHOL 201* 04/10/2010   TRIG 121.0 04/10/2010   HDL 39.30 04/10/2010   LDLDIRECT 140.2 04/10/2010   ALT 23 03/16/2010   AST 21 03/16/2010   NA 143 04/10/2010   K 3.9 04/10/2010   CL 104 04/10/2010   CREATININE 1.0 04/10/2010   BUN 12 04/10/2010   CO2 34* 04/10/2010   TSH 4.388 03/10/2010   INR 3.10* 03/16/2010   HGBA1C 4.6 04/10/2010

## 2010-04-18 ENCOUNTER — Other Ambulatory Visit: Payer: Self-pay | Admitting: Internal Medicine

## 2010-04-19 LAB — CBC
HCT: 33.8 % — ABNORMAL LOW (ref 36.0–46.0)
Hemoglobin: 11.9 g/dL — ABNORMAL LOW (ref 12.0–15.0)
MCHC: 35.1 g/dL (ref 30.0–36.0)
MCV: 87.1 fL (ref 78.0–100.0)
Platelets: 211 10*3/uL (ref 150–400)
RBC: 3.89 MIL/uL (ref 3.87–5.11)
RDW: 17 % — ABNORMAL HIGH (ref 11.5–15.5)
WBC: 7.7 10*3/uL (ref 4.0–10.5)

## 2010-04-19 LAB — PROTIME-INR
INR: 1.9 — ABNORMAL HIGH (ref 0.00–1.49)
INR: 2 — ABNORMAL HIGH (ref 0.00–1.49)
Prothrombin Time: 22.4 seconds — ABNORMAL HIGH (ref 11.6–15.2)
Prothrombin Time: 23.2 seconds — ABNORMAL HIGH (ref 11.6–15.2)

## 2010-04-20 LAB — COMPREHENSIVE METABOLIC PANEL
ALT: 17 U/L (ref 0–35)
AST: 16 U/L (ref 0–37)
Albumin: 3.2 g/dL — ABNORMAL LOW (ref 3.5–5.2)
Alkaline Phosphatase: 67 U/L (ref 39–117)
BUN: 11 mg/dL (ref 6–23)
CO2: 27 mEq/L (ref 19–32)
Calcium: 7.8 mg/dL — ABNORMAL LOW (ref 8.4–10.5)
Chloride: 108 mEq/L (ref 96–112)
Creatinine, Ser: 0.81 mg/dL (ref 0.4–1.2)
GFR calc Af Amer: >60 mL/min (ref >60)
GFR calc non Af Amer: >60 mL/min (ref >60)
Glucose, Bld: 111 mg/dL — ABNORMAL HIGH (ref 70–99)
Potassium: 3.4 mEq/L — ABNORMAL LOW (ref 3.5–5.1)
Sodium: 138 mEq/L (ref 135–145)
Total Bilirubin: 1 mg/dL (ref 0.3–1.2)
Total Protein: 5.6 g/dL — ABNORMAL LOW (ref 6.0–8.3)

## 2010-04-20 LAB — PROTIME-INR
INR: 2.3 — ABNORMAL HIGH (ref 0.00–1.49)
INR: 2.4 — ABNORMAL HIGH (ref 0.00–1.49)
INR: 2.5 — ABNORMAL HIGH (ref 0.00–1.49)
INR: 2.8 — ABNORMAL HIGH (ref 0.00–1.49)
Prothrombin Time: 27.2 seconds — ABNORMAL HIGH (ref 11.6–15.2)
Prothrombin Time: 28.1 seconds — ABNORMAL HIGH (ref 11.6–15.2)
Prothrombin Time: 28.3 seconds — ABNORMAL HIGH (ref 11.6–15.2)
Prothrombin Time: 31.3 seconds — ABNORMAL HIGH (ref 11.6–15.2)

## 2010-04-20 LAB — BASIC METABOLIC PANEL
BUN: 11 mg/dL (ref 6–23)
CO2: 30 mEq/L (ref 19–32)
Calcium: 8.8 mg/dL (ref 8.4–10.5)
Chloride: 105 mEq/L (ref 96–112)
Creatinine, Ser: 0.75 mg/dL (ref 0.4–1.2)
GFR calc Af Amer: >60 mL/min (ref >60)
GFR calc non Af Amer: >60 mL/min (ref >60)
Glucose, Bld: 103 mg/dL — ABNORMAL HIGH (ref 70–99)
Potassium: 3.5 mEq/L (ref 3.5–5.1)
Sodium: 143 mEq/L (ref 135–145)

## 2010-04-20 LAB — CARDIAC PANEL(CRET KIN+CKTOT+MB+TROPI)
CK, MB: 1.8 ng/mL (ref 0.3–4.0)
CK, MB: 1.8 ng/mL (ref 0.3–4.0)
CK, MB: 1.8 ng/mL (ref 0.3–4.0)
CK, MB: 1.9 ng/mL (ref 0.3–4.0)
Relative Index: INVALID (ref 0.0–2.5)
Relative Index: INVALID (ref 0.0–2.5)
Relative Index: INVALID (ref 0.0–2.5)
Relative Index: INVALID (ref 0.0–2.5)
Total CK: 59 U/L (ref 7–177)
Total CK: 73 U/L (ref 7–177)
Total CK: 78 U/L (ref 7–177)
Total CK: 83 U/L (ref 7–177)
Troponin I: 0.01 ng/mL (ref 0.00–0.06)
Troponin I: 0.01 ng/mL (ref 0.00–0.06)
Troponin I: 0.01 ng/mL (ref 0.00–0.06)
Troponin I: 0.02 ng/mL (ref 0.00–0.06)

## 2010-04-20 LAB — CBC
HCT: 33 % — ABNORMAL LOW (ref 36.0–46.0)
Hemoglobin: 11.4 g/dL — ABNORMAL LOW (ref 12.0–15.0)
MCHC: 34.5 g/dL (ref 30.0–36.0)
MCV: 86.1 fL (ref 78.0–100.0)
Platelets: 189 10*3/uL (ref 150–400)
RBC: 3.83 MIL/uL — ABNORMAL LOW (ref 3.87–5.11)
RDW: 17 % — ABNORMAL HIGH (ref 11.5–15.5)
WBC: 7 10*3/uL (ref 4.0–10.5)

## 2010-04-20 LAB — TSH: TSH: 3.323 u[IU]/mL (ref 0.350–4.500)

## 2010-04-20 LAB — MAGNESIUM: Magnesium: 2 mg/dL (ref 1.5–2.5)

## 2010-04-22 ENCOUNTER — Ambulatory Visit (INDEPENDENT_AMBULATORY_CARE_PROVIDER_SITE_OTHER): Payer: BC Managed Care – PPO | Admitting: Emergency Medicine

## 2010-04-22 DIAGNOSIS — Z8672 Personal history of thrombophlebitis: Secondary | ICD-10-CM

## 2010-04-22 DIAGNOSIS — Z7901 Long term (current) use of anticoagulants: Secondary | ICD-10-CM | POA: Insufficient documentation

## 2010-04-22 DIAGNOSIS — I4892 Unspecified atrial flutter: Secondary | ICD-10-CM

## 2010-04-22 DIAGNOSIS — I4891 Unspecified atrial fibrillation: Secondary | ICD-10-CM

## 2010-04-22 DIAGNOSIS — Z86718 Personal history of other venous thrombosis and embolism: Secondary | ICD-10-CM

## 2010-04-22 LAB — POCT INR: INR: 2.1

## 2010-04-22 MED ORDER — WARFARIN SODIUM 7.5 MG PO TABS: 7.5000 mg | ORAL_TABLET | ORAL | Status: DC

## 2010-04-22 MED ORDER — PROPAFENONE HCL ER 325 MG PO CP12: 325.0000 mg | ORAL_CAPSULE | Freq: Two times a day (BID) | ORAL | Status: DC

## 2010-04-24 ENCOUNTER — Ambulatory Visit
Admission: RE | Admit: 2010-04-24 | Discharge: 2010-04-24 | Disposition: A | Discharge status: Home / Self Care | Payer: Medicare Other | Source: Ambulatory Visit | Attending: Internal Medicine | Admitting: Internal Medicine

## 2010-04-24 DIAGNOSIS — R1011 Right upper quadrant pain: Secondary | ICD-10-CM

## 2010-04-24 NOTE — Progress Notes (Signed)
Quick Note:  Voice message left on [employer] system - lab is negative, normal or otherwise stable, pt to continue same tx ______ 

## 2010-04-27 ENCOUNTER — Telehealth: Payer: Self-pay

## 2010-04-27 LAB — BASIC METABOLIC PANEL
BUN: 7 mg/dL (ref 6–23)
CO2: 29 mEq/L (ref 19–32)
Calcium: 7.8 mg/dL — ABNORMAL LOW (ref 8.4–10.5)
Chloride: 102 mEq/L (ref 96–112)
Creatinine, Ser: 0.72 mg/dL (ref 0.4–1.2)
GFR calc Af Amer: >60 mL/min (ref >60)
GFR calc non Af Amer: >60 mL/min (ref >60)
Glucose, Bld: 115 mg/dL — ABNORMAL HIGH (ref 70–99)
Potassium: 3.4 mEq/L — ABNORMAL LOW (ref 3.5–5.1)
Sodium: 138 mEq/L (ref 135–145)

## 2010-04-27 LAB — CBC
HCT: 29.8 % — ABNORMAL LOW (ref 36.0–46.0)
Hemoglobin: 10.1 g/dL — ABNORMAL LOW (ref 12.0–15.0)
MCHC: 34.1 g/dL (ref 30.0–36.0)
MCV: 84.8 fL (ref 78.0–100.0)
Platelets: 235 10*3/uL (ref 150–400)
RBC: 3.51 MIL/uL — ABNORMAL LOW (ref 3.87–5.11)
RDW: 17.1 % — ABNORMAL HIGH (ref 11.5–15.5)
WBC: 5 10*3/uL (ref 4.0–10.5)

## 2010-04-27 LAB — VANCOMYCIN, TROUGH: Vancomycin Tr: 13.5 ug/mL (ref 10.0–20.0)

## 2010-04-27 LAB — PROTIME-INR
INR: 2.5 — ABNORMAL HIGH (ref 0.00–1.49)
INR: 2.5 — ABNORMAL HIGH (ref 0.00–1.49)
INR: 2.6 — ABNORMAL HIGH (ref 0.00–1.49)
INR: 2.7 — ABNORMAL HIGH (ref 0.00–1.49)
Prothrombin Time: 28.4 seconds — ABNORMAL HIGH (ref 11.6–15.2)
Prothrombin Time: 29.1 seconds — ABNORMAL HIGH (ref 11.6–15.2)
Prothrombin Time: 29.8 seconds — ABNORMAL HIGH (ref 11.6–15.2)
Prothrombin Time: 30.2 seconds — ABNORMAL HIGH (ref 11.6–15.2)

## 2010-04-27 NOTE — Telephone Encounter (Signed)
Patient [name] [name] requesting results from last U/S. Please advise

## 2010-04-28 LAB — APTT: aPTT: 31 seconds (ref 24–37)

## 2010-04-28 LAB — BASIC METABOLIC PANEL
BUN: 11 mg/dL (ref 6–23)
BUN: 12 mg/dL (ref 6–23)
BUN: 13 mg/dL (ref 6–23)
CO2: 30 mEq/L (ref 19–32)
CO2: 32 mEq/L (ref 19–32)
CO2: 34 mEq/L — ABNORMAL HIGH (ref 19–32)
Calcium: 8.3 mg/dL — ABNORMAL LOW (ref 8.4–10.5)
Calcium: 8.9 mg/dL (ref 8.4–10.5)
Calcium: 9.1 mg/dL (ref 8.4–10.5)
Chloride: 100 mEq/L (ref 96–112)
Chloride: 101 mEq/L (ref 96–112)
Chloride: 104 mEq/L (ref 96–112)
Creatinine, Ser: 0.73 mg/dL (ref 0.4–1.2)
Creatinine, Ser: 0.78 mg/dL (ref 0.4–1.2)
Creatinine, Ser: 0.81 mg/dL (ref 0.4–1.2)
GFR calc Af Amer: >60 mL/min (ref >60)
GFR calc Af Amer: >60 mL/min (ref >60)
GFR calc Af Amer: >60 mL/min (ref >60)
GFR calc non Af Amer: >60 mL/min (ref >60)
GFR calc non Af Amer: >60 mL/min (ref >60)
GFR calc non Af Amer: >60 mL/min (ref >60)
Glucose, Bld: 114 mg/dL — ABNORMAL HIGH (ref 70–99)
Glucose, Bld: 78 mg/dL (ref 70–99)
Glucose, Bld: 95 mg/dL (ref 70–99)
Potassium: 3.8 mEq/L (ref 3.5–5.1)
Potassium: 4 mEq/L (ref 3.5–5.1)
Potassium: 4.4 mEq/L (ref 3.5–5.1)
Sodium: 139 mEq/L (ref 135–145)
Sodium: 140 mEq/L (ref 135–145)
Sodium: 144 mEq/L (ref 135–145)

## 2010-04-28 LAB — CBC
HCT: 33.4 % — ABNORMAL LOW (ref 36.0–46.0)
HCT: 33.7 % — ABNORMAL LOW (ref 36.0–46.0)
Hemoglobin: 11.7 g/dL — ABNORMAL LOW (ref 12.0–15.0)
Hemoglobin: 11.7 g/dL — ABNORMAL LOW (ref 12.0–15.0)
MCHC: 34.8 g/dL (ref 30.0–36.0)
MCHC: 34.9 g/dL (ref 30.0–36.0)
MCV: 84.7 fL (ref 78.0–100.0)
MCV: 86.2 fL (ref 78.0–100.0)
Platelets: 210 10*3/uL (ref 150–400)
Platelets: 212 10*3/uL (ref 150–400)
RBC: 3.88 MIL/uL (ref 3.87–5.11)
RBC: 3.98 MIL/uL (ref 3.87–5.11)
RDW: 17.1 % — ABNORMAL HIGH (ref 11.5–15.5)
RDW: 17.3 % — ABNORMAL HIGH (ref 11.5–15.5)
WBC: 5.6 10*3/uL (ref 4.0–10.5)
WBC: 7.9 10*3/uL (ref 4.0–10.5)

## 2010-04-28 LAB — PROTIME-INR
INR: 0.9 (ref 0.00–1.49)
INR: 1 (ref 0.00–1.49)
INR: 1 (ref 0.00–1.49)
INR: 1.1 (ref 0.00–1.49)
Prothrombin Time: 12.5 seconds (ref 11.6–15.2)
Prothrombin Time: 12.8 seconds (ref 11.6–15.2)
Prothrombin Time: 13.7 seconds (ref 11.6–15.2)
Prothrombin Time: 14.5 seconds (ref 11.6–15.2)

## 2010-04-28 NOTE — Telephone Encounter (Signed)
Called patient; left message to call back.

## 2010-04-29 NOTE — Telephone Encounter (Signed)
Called patient informed of ultrasound results.

## 2010-05-04 ENCOUNTER — Telehealth: Payer: Self-pay

## 2010-05-04 ENCOUNTER — Telehealth: Payer: Self-pay | Admitting: *Deleted

## 2010-05-04 NOTE — Telephone Encounter (Signed)
Pt's Prolia is due 05-26-10.  Rec her benefit summary stating prior Berkley Harvey is required and her OOP is $0.  I advised pt of this and transferred her to scheduler to make nurse visit. Will order med.

## 2010-05-04 NOTE — Telephone Encounter (Signed)
Patient called with question regarding prolia appt. She wants to know if she will need to c/x that appt should she get sick around that time?

## 2010-05-07 ENCOUNTER — Encounter: Payer: Self-pay | Admitting: Cardiovascular Disease

## 2010-05-07 NOTE — Telephone Encounter (Signed)
Pt advised would be ok to keep appt if she felt fine to come in

## 2010-05-19 ENCOUNTER — Other Ambulatory Visit: Payer: Self-pay | Admitting: Internal Medicine

## 2010-05-20 ENCOUNTER — Telehealth: Payer: Self-pay | Admitting: Internal Medicine

## 2010-05-20 ENCOUNTER — Ambulatory Visit (INDEPENDENT_AMBULATORY_CARE_PROVIDER_SITE_OTHER): Payer: BC Managed Care – PPO | Admitting: Emergency Medicine

## 2010-05-20 DIAGNOSIS — Z8672 Personal history of thrombophlebitis: Secondary | ICD-10-CM

## 2010-05-20 DIAGNOSIS — I4891 Unspecified atrial fibrillation: Secondary | ICD-10-CM

## 2010-05-20 DIAGNOSIS — Z86718 Personal history of other venous thrombosis and embolism: Secondary | ICD-10-CM

## 2010-05-20 DIAGNOSIS — I4892 Unspecified atrial flutter: Secondary | ICD-10-CM

## 2010-05-20 LAB — POCT INR: INR: 2.3

## 2010-05-20 NOTE — Telephone Encounter (Signed)
Sample of symbicort left up front for pick up.  Pt aware.

## 2010-05-26 ENCOUNTER — Ambulatory Visit (INDEPENDENT_AMBULATORY_CARE_PROVIDER_SITE_OTHER): Payer: BC Managed Care – PPO

## 2010-05-26 DIAGNOSIS — M810 Age-related osteoporosis without current pathological fracture: Secondary | ICD-10-CM

## 2010-05-26 MED ORDER — DENOSUMAB 60 MG/ML ~~LOC~~ SOLN
60.0000 mg | Freq: Once | SUBCUTANEOUS | Status: AC
  Administered 2010-05-26: 60 mg via SUBCUTANEOUS

## 2010-05-26 NOTE — H&P (Signed)
NAMECORIANN, BROUHARD NO.:  [id number]   MEDICAL RECORD NO.:  [id number]          PATIENT TYPE:  INP   LOCATION:  1827                         FACILITY:  [facility]   PHYSICIAN:  Hollice Espy, M.D.DATE OF BIRTH:  Apr 03, 1950   DATE OF ADMISSION:  12/07/2006  DATE OF DISCHARGE:                              HISTORY & PHYSICAL   PRIMARY CARE PHYSICIAN:  Corwin Levins, MD   CHIEF COMPLAINT:  Chest pain.   HISTORY OF PRESENT ILLNESS:  The patient is a 60 year old white female  with past medical history of morbid obesity, atrial fibrillation and  COPD, who has noted for the last few days she has had some lower  extremity swelling but no complaints of chest pain or shortness of  breath but then today, this morning she said she started having sudden-  onset severe chest pain described as both a pressure and sharp pain on  the left side of her chest, nonradiating but also sudden-onset shortness  of breath.  She called paramedics after her symptoms persisted and was  brought into the emergency room.  She was noted on admission to have a  blood pressure of 158/79 but her saturations were noted to be 98% on  room air.  An EKG done showed a   Dictation ended at this point.      Hollice Espy, M.D.  Electronically Signed     SKK/MEDQ  D:  12/07/2006  T:  12/07/2006  Job:  [id number]

## 2010-05-26 NOTE — Assessment & Plan Note (Signed)
[facility]                             PULMONARY OFFICE NOTE   [name] [name], SAHM                       MRN:          [id number]  DATE:12/16/2006                            DOB:          [birth year]    HISTORY OF PRESENT ILLNESS:  This patient is a very complicated 60-year-  old white female patient of Dr. Thurston Hole, who presents today for one week  post-hospitalization visit.  The patient was hospitalized November 26th  through November 28th for acute hypercarbic respiratory failure  precipitated by the use of intravenous narcotics.  The patient had  presented to the emergency room initially with abrupt onset of left-  sided back pain.  She received IV narcotics, and subsequently had some  respiratory distress requiring transient use of BiPAP.  Symptoms  improved, and the patient was transitioned over to oxygen via nasal  cannula.  The patient did undergo a CT scan of the chest which showed no  evidence of pulmonary embolism, and patient has had a history of  previous DVT and PE and is on anticoagulation therapy.  The patient was  therapeutic during her hospitalization.  The patient has brought all of  her medications in today for review.  The patient's medications are  correct with her medication calendar.  The patient reports that she has  returned back to her baseline and is actually walking outside with her  oxygen and trying to increase her activity level.  The patient denies  any hemoptysis, orthopnea, PND or leg swelling.   PAST MEDICAL HISTORY:  Reviewed.   CURRENT MEDICATIONS:  Reviewed.   PHYSICAL EXAMINATION:  GENERAL:  The patient is a pleasant female, no  acute distress.  VITAL SIGNS:  She is afebrile with stable vital signs.  O2 saturation is  96% on 2 liters.  Weight is up 6 pounds at 281.  HEENT:  Unremarkable.  NECK:  Supple without cervical adenopathy.  No jugular venous  distention.  LUNGS:  Sounds are diminished at the bases,  otherwise clear.  CARDIAC:  Regular rate.  ABDOMEN:  Soft and nontender.  EXTREMITIES:  Warm without any edema.   IMPRESSION/PLAN:  1. Recent acute hypercarbic respiratory failure, precipitated by      intravenous narcotics, now resolved, with underlying chronic      obstructive pulmonary disease and chronic hypercarbia.  The patient      will, as recommended, to continue on her present regimen including      Symbicort 80/4.5 two puffs twice daily, Spiriva daily and      continuous oxygen therapy.  The patient to return back in one month      with Dr. Sherene Sires or sooner if needed.  2. Complex medication regimen.  The patient's medication reviewed in      detail.  Patient education was provided.  The patient's      computerized medication calendar was reviewed,      and patient education was provided.  The patient will bring this      back to her next follow-up visit.      [name]  Parrett, NP  Electronically Signed      Charlaine Dalton. Sherene Sires, MD, [facility]  Electronically Signed   TP/MedQ  DD: 12/16/2006  DT: 12/16/2006  Job #: [id number]

## 2010-05-26 NOTE — Op Note (Signed)
NAME:  [name] [name], [name] [name]                ACCOUNT NO.:  [id number]   MEDICAL RECORD NO.:  [id number]          PATIENT TYPE:  OIB   LOCATION:  6735                         FACILITY:  [facility]   PHYSICIAN:  Johnette Abraham, MD    DATE OF BIRTH:  1950/07/09   DATE OF PROCEDURE:  DATE OF DISCHARGE:                               OPERATIVE REPORT   PREOPERATIVE DIAGNOSIS:  Fracture of the left distal radius and ulnar  styloid.   POSTOPERATIVE DIAGNOSIS:  Fracture of the left distal radius and ulnar  styloid.   PROCEDURE:  Open reduction and internal fixation of the left distal  radius with a Stryker volar plate.   SURGEON:  Harrill C. Izora Ribas, MD   ASSISTANTS:  None.   ANESTHESIA:  General.   No specimens.   ESTIMATED BLOOD LOSS:  Minimal.   TOURNIQUET TIME:  41 minutes.   No acute complications.   POSTOPERATIVE CONDITION:  Stable.   INDICATIONS:  [name] is a 60 year old female who fell off a chair at  home sustaining a fracture to her left distal radius and ulnar styloid.  Risks, benefits, and alternatives of surgery were thoroughly discussed  with the patient and she agreed to proceed with open and reduction  internal fixation.  Consent was obtained.  Of note, the patient has  significant comorbidities and therefore preoperative medical clearance  was obtained from her pulmonologist to be offered Coumadin and as of the  safety to general anesthetic due to her pulmonary status.  Prior to the  procedure, her blood levels were checked.  Her INR was normalized at  1.1.  Her other labs were satisfactory.   PROCEDURE:  The patient was taken to the operating room, placed supine  on the operating room table.  Extremities were padded.  She received  preoperative antibiotics.  General anesthesia was administered.  The  left upper extremity was prepped and draped in the normal sterile  fashion.  Esmarch was used to exsanguinate the arm.  A tourniquet was  inflated to 250 mmHg.  An  incision overlying the FCR tendon was made.  Blunt dissection was carried down to the tendon.  The tendon was  retracted ulnarly and the radial artery radially.  The deep fascia was  opened and the fracture site was isolated.  Pronator quadratus muscle  was taken down in a stepwise fashion.  The fracture was then reduced and  an appropriate size Stryker VariAx volar plate was chosen.  Several K-  wires were used to temporarily fix the plate.  X-ray examination  revealed good length to the plate and good placement with anatomic  reduction of the fracture.  Screws were placed in the radial shaft.  Each screw was drilled and measured and an appropriate size screw was  placed.  X-ray examination again revealed good plate placement and good  screw length and therefore the distal screws were placed.  Each of the  distal screws were drilled, measured to length, and appropriate size  locking screws were placed.  After all screws were placed, postoperative  x-rays revealed good anatomic reduction  of the fracture, good screw  length without any entrance into the joint.  Wound was then irrigated  with irrigation solution.  The pronator quadratus muscle was then  approximated over the plate with 4-0 Vicryl.  The deep fascia and the  subcuticular layers were also approximated with 4-0 Vicryl.  Skin was  closed with running 4-0 Monocryl.  The tourniquet was released prior to  skin closure.  There was no gross bleeding.  Xeroform, a sterile  dressing, and a volar splint were placed.  The patient tolerated the  procedure well, was taken to the recovery room in stable condition.      Johnette Abraham, MD  Electronically Signed     HCC/MEDQ  D:  02/23/2008  T:  02/24/2008  Job:  [id number]

## 2010-05-26 NOTE — Discharge Summary (Signed)
[name] [name], [name] [name]                ACCOUNT NO.:  [id number]   MEDICAL RECORD NO.:  [id number]          PATIENT TYPE:  INP   LOCATION:  5731                         FACILITY:  [facility]   PHYSICIAN:  Casimiro Needle B. Sherene Sires, MD, FCCPDATE OF BIRTH:  [birth year]   DATE OF ADMISSION:  12/07/2006  DATE OF DISCHARGE:  12/09/2006                               DISCHARGE SUMMARY   FINAL DIAGNOSIS:  1. Acute hypercarbic respiratory failure precipitated by the use of      intravenous narcotics.      a.     Transiently required BiPAP (bilateral positive airway       pressure) during admission.      b.     Chronic obstructive pulmonary disease plus obesity likely       contributing to chronic hypercarbia.  2. Abrupt-onset left-sided back pain after working in her garden the      day before admission, improved at the time of discharge without the      need for intravenous narcotics.  3. Therapeutic anticoagulation on Coumadin for a history of blood      clots.  4. Hypertension.   For history please see H&P.  This is a 60 year old white female who was  last seen in our office on October 18 feeling 80% better after an  adjustment of her antihypertensives to eliminate the use of ACE  inhibitors on a trial basis to see to what extent her dyspnea improved.  She was supposed to return to the office within four weeks, but says she  felt so good she did not feel like she needed to, and was actually  working in the garden the day prior to admission despite the fact that  she is oxygen dependent 24 hours a day.   She abruptly developed left-sided chest pain.  When she came to the  emergency room she was given IV Dilaudid and then developed hypercarbic  respiratory failure requiring BiPAP, and Critical Care admitted her.   However, she is completely back to baseline presently, stating she wants  to go home and that she does not rest well in the hospital.   At time of discharge she was alert, appropriate with clear  lungs.   Her workup included a chest CT scan showing no evidence of pulmonary  embolism, and so presumably the cause of the chest pain was  musculoskeletal from overdoing it in her garden.   Her diet is to be a low-salt, low-fat diet.   Her medications are to be resumed per calendar as follows:  1. Furosemide 40 mg q.a.m.  2. Warfarin per Coumadin clinic.  3. Potassium 10 mEq q.a.m.  4. Prozac 40 mg q.a.m.  5. Protonix taken before meals twice daily.  6. Rythmol 325 one daily.  7. Veramyst 1 puff b.i.d.  8. Symbicort 80/4.5 2 puffs b.i.d.  9. Amlodipine 10 mg one daily.  10.Diovan 160 mg one daily.  11.Spiriva 1 capsule 2 puffs q.a.m.  12.O2 24 hours a day at 2 L.  13.For symptomatic breakthrough wheezing or dyspnea she can use  Xopenex HFA 2 puffs every 4 hours.  14.For pain use Tylenol p.r.n.  15.Add tramadol to Tylenol if not effective.  16.For cough and congestion use Mucinex DM 2 b.i.d.  17.For drippy nose use Zyrtec 10 mg q.h.s. p.r.n.  18.For nasal congestion use salt-water spray 2 puffs every 4 hours as      needed.  19.For stuffy nose use Afrin 12-Hour 1 puff twice daily for 5 days      only, immediately before Veramyst.   Followup will be within five days for full medication review to make  sure she is still on the same medications that she reported being taking  when she was last seen on October 14, emphasizing that compliance and  outpatient followup with this patient have been problematic and we need  to establish that she is back to her baseline (which was 80% better  prior to her acute exacerbation with back pain).      [name] [name]. Sherene Sires, MD, [facility]  Electronically Signed     MBW/MEDQ  D:  12/09/2006  T:  12/09/2006  Job:  [id number]   cc:   Corwin Levins, MD

## 2010-05-26 NOTE — Assessment & Plan Note (Signed)
[facility]                             PULMONARY OFFICE NOTE   [name] [name], [name] [name]                       MRN:          [id number]  DATE:08/30/2006                            DOB:          December 07, 1950    HISTORY OF PRESENT ILLNESS:  The patient is a 60 year old white female  patient of Dr. Thurston Hole who has a known history of moderate chronic  obstructive pulmonary disease on pulmonary function tests with an FEV1  of 1.29 L which is 55% of the predicted.  The patient returns today for  followup and medication review.  The patient had   INCOMPLETE      Rubye Oaks, NP       Charlaine Dalton. Sherene Sires, MD, [facility]    TP/MedQ  DD: 09/30/2006  DT: 09/30/2006  Job #: [id number]

## 2010-05-26 NOTE — Discharge Summary (Signed)
NAMEBREELEY, BISCHOF NO.:  [id number]   MEDICAL RECORD NO.:  [id number]          PATIENT TYPE:  INP   LOCATION:  2502                         FACILITY:  [facility]   PHYSICIAN:  Doylene Canning. Ladona Ridgel, MD    DATE OF BIRTH:  03-14-1950   DATE OF ADMISSION:  08/12/2008  DATE OF DISCHARGE:  08/13/2008                               DISCHARGE SUMMARY   This patient has an intolerance to BISPHOSPHONATES.  She has an allergy  to PENICILLIN.   Time for this dictation and preparation of medications at discharge,  greater than 40 minutes.   FINAL DIAGNOSES:  1. Discharging day 1, status post electrophysiology      study/radiofrequency catheter ablation delivered through a large      cavotricuspid atrial flutter isthmus (bidirectional block).      a.     Atrial fibrillation with dominant rhythm during the study.      b.     Direct current cardioversion at the end of the study.      c.     The patient had postprocedure chest pain, probably from       cardioversion, now resolved.  2. History of atrial flutter with effective suppression with Rythmol.   SECONDARY DIAGNOSES:  1. History of deep vein thrombosis/pulmonary embolism, on chronic      Coumadin.  2. Chronic obstructive pulmonary disease, home oxygen.  3. Paroxysmal atrial fibrillation, suppressed with Rythmol.  4. Hypertension.  5. Morbid obesity.  6. Chronic rhinitis.  7. Atypical chest pain.      a.     Catheterization in 2003, angiographically normal coronaries.  8. Myoview study in 2007, negative for ischemia or scar.  9. Dyslipidemia.  10.History of tobacco habituation, quitting in 2003.  11.Osteoporosis.   PROCEDURE:  On August 12, 2008, electrophysiology study, radiofrequency  catheter ablation of atrial flutter with radiofrequency delivered  through a large cavotricuspid atrial flutter isthmus, creation of  bidirectional block, Dr. Lewayne Bunting.   BRIEF HISTORY:  Ms. Simerson is a 60 year old female.  She has  been  referred to Dr. Ladona Ridgel by Dr. Daleen Squibb for evaluation of atrial flutter.  The patient has multiple medical problems.  The patient had a history of  recurrent pulmonary emboli.  She has COPD, obesity, and hypoventilation  syndrome.  She is on chronic home oxygen.   The patient has a history of atrial fibrillation, this is suppressed  with Rythmol.  The patient was seen by Dr. Daleen Squibb several weeks ago.  She  was found to be in atrial flutter with rapid ventricular rate.  She  spontaneously converted to normal sinus rhythm.  She does have paroxysms  of heart racing.  She denies chest pain.  She has chronic peripheral  edema.  No history of syncope.   The risks and benefits of atrial flutter ablation have been described to  the patient and she wishes to proceed.   HOSPITAL COURSE:  The patient presents electively on August 2.  She  underwent electrophysiology study with radiofrequency catheter ablation  of a large atrial flutter isthmus.  The  dominant dysrhythmia was atrial  fibrillation, however, and she required direct current cardioversion  after the ablation had been completed.  The patient has maintained sinus  rhythm in the postprocedure period.  Her INR is somewhat subtherapeutic  INR on discharge today.  She has been taking increased doses of  Coumadin.  Since she has seen the Coumadin Clinic in [place] on July  30, she will continue with these and just for review they are as  follows:  She was taking 11.25 mg on Friday, Saturday of July 30 and  July 31; August 1, Sunday, she was on 7.5; Monday, 11.25; Tuesday, she  would have taken 11.25, that is today; Wednesday, 11.25.  She presents  on Thursday, August 5 to the Coumadin Clinic at 10 o'clock in  [place].  She will see Dr. Ladona Ridgel on Monday, August 23, at 10:45.   LABORATORY STUDIES:  Pertinent to this admission were drawn on July 30,  white cells 6.4, hemoglobin 12.1, hematocrit 38.6, and platelets of 210.  Sodium 144,  potassium 4.2, chloride 105, carbonate 26, glucose 76, BUN  is 14, creatinine 0.78.   Medications have been reviewed and are printed out on a separate  medication discharge sheet, which has been given to the patient and is  available on E-chart at [employer].      Maple Mirza, PA      Doylene Canning. Ladona Ridgel, MD  Electronically Signed    GM/MEDQ  D:  08/13/2008  T:  08/13/2008  Job:  [id number]   cc:   Corwin Levins, MD

## 2010-05-26 NOTE — Assessment & Plan Note (Signed)
[facility]                             PULMONARY OFFICE NOTE   SU, DUMA                       MRN:          [id number]  DATE:07/25/2006                            DOB:          1950/11/25    PULMONARY EXTENDED SUMMARY FINAL FOLLOW UP VISIT:   HISTORY:  A 60 year old white female with documented COPD by previous  PFT's in 2006 who returns complaining of dyspnea with activity that  occurs with doing any more than sweeping for 2 minutes or walking flat  for 3-4 minutes.  She says that her breathing has been the same day in  and day out with no real obvious weather environmental triggers,  although it is worse in hot, humid weather outside.  She is maintained  on Symbicort 80/4.5 two puffs b.i.d. with p.r.n. use of Xopenex which  she says helps only a little.  She denies any exertional chest pain,  orthopnea, PND fevers, chills, sweats or leg swelling.  In addition to  these complaints, she is now noticing worsening hoarseness with the  sensation of postnasal drainage, but no excess sputum production.  This  occurs worse as the day goes on associated with hoarseness and has  definitely worsened since placed on biphosphonates and ace inhibitors,  which I believe were both added at the same time.   MEDICATIONS:  For a full review of medications, please see face sheet  dated July 25, 2006.  Note that she has been placed back on ACE  inhibitors and biphosphonates by Dr. Jonny Ruiz.   PHYSICAL EXAMINATION:  GENERAL:  She is a hoarse, ambulatory white  female in no acute distress with classic voice fatigue.  That is, the  more she talks, the more hoarse she becomes.  VITAL SIGNS:  Stable.  HEENT:  Unremarkable.  Oropharynx is clear.  There is no evidence of  excessive postnasal drainage or cobblestoning, and nasal turbinates are  normal.  Ear canals clear bilaterally with no cough reflex.  LUNGS:  Lung fields are completely clear bilaterally except  for minimal  pseudo-wheeze.  There is adequate but diminished air movement.  HEART:  Regular rhythm without murmur, gallop, or rub.  ABDOMEN:  Soft, benign.  EXTREMITIES:  Warm without calf tenderness.  No clubbing, cyanosis, or  edema.   PULMONARY FUNCTION TESTS:  PFT's were reviewed today and compared to  previous study from 2006.  These indicate an identical FEV1 of around  1.3 liters with the same ratio of 53% and no improvement after  bronchodilators.   Of note, the most marked abnormality that I see on the PFT's is the fact  that her expiratory reserve volume (ERV) is only 28% predicted, which is  typical of the effects of obesity.   IMPRESSION:  1. This patient clearly has chronic obstructive pulmonary disease with      minimum asthmatic component that is being adequately addressed with      a combination of low-dose Symbicort and p.r.n. Xopenex.  2. She is suffering from obesity with deconditioning.  I explained in      detail  to her that the way to recondition is if she can walk for 3-      4 minutes at a flat pace without difficulty before giving out, she      should try to slow it down and walk for up to 30 minutes at a time,      using Xopenex on a p.r.n. basis when she is having a bad day.      Without more consistent effort on her part, she will not improve      her calorie balance (note - she has had progressive weight gain      since I have been seeing her and pointed this problem out as a      reversible component of her doe), and this will lead to progressive      debilitation, as well.  I believe she would be a good candidate for      rehabilitation if her insurance would pay for it and she would go.  3. Finally, she has a variable component of upper airways dysfunction      with dry cough and hoarseness that I believe are related to vocal      cord dysfunction.  The extent to which either ACE inhibitors or      biphosphonates are contributing to this problem  cannot be      established except by trial off and on therapy (that is, switch off      of ACE inhibitors, try off of biphosphonates, and then challenge      her one at a time to see with, if either, makes her symptoms      worse).  Historically, I think the biphosphonates and the ACE      inhibitors were started about the same time, so it is not possible      to determine cause and effect, and certainly there are indications      for use of both.  Alternatives might be to use ARB therapy instead      of ACE inhibitors, and once monthly biphosphonates instead of once      weekly, but I will defer that to Dr. Raphael Gibney capable judgment.   There is really little else to offer in the pulmonary clinic.  If the  upper airway symptoms continue and fail to respond to the above  measures, I would not hesitate to have her referred to ENT (noting that  she is a former smoker with risk of neoplasm in the upper airway always  a concern). I would caution finally that she seems to have a great deal  of difficulty with the concept of med reconciliation, and I will also  defer this issue to Dr Jonny Ruiz.     [name] [name]. [name] Sires, MD, [facility]  Electronically Signed    MBW/MedQ  DD: 07/25/2006  DT: 07/26/2006  Job #: [id number]   cc:   Corwin Levins, MD

## 2010-05-26 NOTE — Discharge Summary (Signed)
NAME:  [name] [name], [name] [name]                ACCOUNT NO.:  [id number]   MEDICAL RECORD NO.:  [id number]          PATIENT TYPE:  INP   LOCATION:  6715                         FACILITY:  [facility]   PHYSICIAN:  Casimiro Needle B. Sherene Sires, MD, FCCPDATE OF BIRTH:  02-22-1950   DATE OF ADMISSION:  01/09/2008  DATE OF DISCHARGE:  01/15/2008                               DISCHARGE SUMMARY   DISCHARGE DIAGNOSES:  1. Presumed community acquired pneumonia with left lower lobe air      space disease, purulent green sputum and hemoptysis.  2. Hypertension.  3. Atrial fibrillation and a history of pulmonary embolism.  4. Morbid obesity.   HISTORY OF PRESENT ILLNESS:  [name] [name] is a 60 year old Caucasian  female who was admitted on January 09, 2008 from the office with  continued purulent sputum, generalized weakness and failure to thrive.  Chest x-ray demonstrated left lower lobe air space disease.  She had  purulent sputum that was noted to be green in nature along with  hemoptysis.  She is notably on Coumadin with a therapeutic INR.  At that  time she was admitted for further evaluation and treatment.   LABORATORY DATA:  A repeat chest x-ray on January 12, 2008 demonstrates a  new right PICC line in good position with decrease of left lower lobe  air space disease with possible small left pleural effusion.  Micro on  January 10, 2008 shows sputum with normal oropharyngeal flora.  Hemoglobin 10.1, hematocrit 29.0, white blood cells 5.0, platelet count  235,000.  Sodium 138, potassium 3.4, chloride 102, CO2 29, BUN 7,  creatinine 0.72, glucose 115.  INR was noted to be 2.7.  Calcium 7.8.  BNP was noted to be 71.   HOSPITAL COURSE BY DISCHARGE DIAGNOSIS:  1. Left lower lobe air space disease with purulent green sputum with      presumed community acquired pneumonia.  She was admitted to [facility] and treated with IV antibiotics consisting      of Avelox and vancomycin.  Sputum culture  come back with normal      flora.  She was also treated with Tamiflu for a presumed viral      process, although no viral studies were undertaken.  She reached      maximum hospital benefit by January 15, 2008 and was ready for      discharge home.  She will continue on Avelox for another four days      to complete 10 days of antimicrobial therapy.  She will follow up      with a chest x-ray with [name] Parrett in the office on January 22, 2008 with a PA and lateral chest x-ray to evaluate her left lower      lobe air space disease.  She will also follow up with Dr. Sherene Sires on      January 31, 2008 at 9:45 A.M.   1. Hypertension.  She will continue on her current treatments  We will      also add back  her hydralazine as taken at home.   1. Atrial fibrillation.  She will continue on her Rythmol and her      Coumadin.  She is therapeutic on INR.   1. History of pulmonary embolism.  Note, she continues on her      Coumadin.   1. Morbid obesity, noted to be 126.2 kg.  Weight loss has been      suggested.   DISCHARGE MEDICATIONS:  Note that she is on a complex therapeutic  pharmaceutical regimen.  She has a medications calendar that is kept at  the [facility] and will be upgraded by Rubye Oaks, Nurse  Practitioner.  1. Furosemide 40 mg daily.  2. Coumadin 7.5 mg on Monday, Tuesday, Thursday, Friday and Sunday.      Coumadin 11.5 mg on Wednesday and Saturday.  3. Klor-Con 10 mEq daily.  4. Fluoxetine 40 mg daily.  5. Omeprazole 20 mg daily.  6. Rythmol 325 mg daily.  7. Veramyst 1 puff morning, one puff at night.  8. Symbicort 164.5, 2 puffs morning, 2 puffs at night.  9. Exforge 10/320 daily.  10.Spiriva hand-held nebulizer 18 mcg inhaled once daily.  11.Glucosamine daily.  12.Calcium with vitamin D daily.  13.Vitamin D 1000 units daily.  14.Forteo 20 mcg injection at night.  15.Xopenex HFA 2 puffs every 4 hours as needed.  16.Tylenol 650 mg four times a day as needed.   17.Mucinex DM one every 12 hours as needed.  18.Tramidol 50 mg every 4 hours as needed.  19.Zyrtec 10 mg nightly as needed.  20.Saline 2 puffs every four hours as needed.  21.Afrin 1 puff q.12h. twice daily as needed, not to exceed 3 days in      a row.  22.Furosemide 40 mg extra if needed for lower extremity edema.  23.Hydralazine hydrochloride 10 mg t.i.d.  24.Avelox 400 mg 1 a day until gone.  (Note, this is a new medication,      she is given a prescription).  25.She is on O2 at 2L nasal cannula 24 hours a day as prior to      admission.   DIET:  She should be on a low carb, heart-healthy diet.   ACTIVITY:  Her activity is as tolerated.   FOLLOWUP:  She will follow up with Rubye Oaks on January 22, 2008 at  9:30 A.M. with a chest x-ray and Dr. Sherene Sires on January 31, 2008 at 9:45  A.M.   DISPOSITION/CONDITION ON DISCHARGE:  Improved.      Devra Dopp, MSN, ACNP      Charlaine Dalton. Sherene Sires, MD, [facility]  Electronically Signed   SM/MEDQ  D:  01/15/2008  T:  01/15/2008  Job:  [id number]   cc:   Thomas C. Wall, MD, [facility]

## 2010-05-26 NOTE — H&P (Signed)
NAMESPARKLES, MCNEELY                ACCOUNT NO.:  [id number]   MEDICAL RECORD NO.:  [id number]          PATIENT TYPE:  INP   LOCATION:  3731                         FACILITY:  [facility]   PHYSICIAN:  Jesse Sans. Wall, MD, FACCDATE OF BIRTH:  28-Oct-1950   DATE OF ADMISSION:  06/13/2008  DATE OF DISCHARGE:                              HISTORY & PHYSICAL   CHIEF COMPLAINT:  My heart has been racing for 3 days, and I am short  of breath, and now my jaw aches.   HISTORY OF PRESENT ILLNESS:  Ms. [name] [name] is a 60 year old white  female, patient well known to me, who has a history of paroxysmal atrial  fibrillation well controlled with Rythmol.  She is also on chronic  anticoagulation for this as well as a pulmonary embolus in the past.   She developed rapid heartbeat about 3 days ago.  She became dizzy and  short of breath.  She has baseline dyspnea from chronic O2 dependent  COPD.  She began to feel weak.  Tuesday night, her teeth hurt, but have  not hurt since.  Today is Thursday.   She called the office in [facility] yesterday, was then advised by Carolin Sicks, my nurse, to go to the hospital.  She refused.  She comes today  in the office.   PAST MEDICAL HISTORY:  Significant for allergy to PENICILLIN.   CURRENT MEDICATIONS:  1. Potassium 20 mEq 2 p.o. q.a.m.  2. Warfarin 7.5 mg a day.  3. Furosemide 40 mg a day.  4. Fluoxetine 10 mg per day.  5. Omeprazole 20 mg per day.  6. Rythmol 325 mg b.i.d.  7. Veramyst 27.5 mcg 1 spray b.i.d.  8. Symbicort 160/4.5 two puffs b.i.d.  9. Exforge 10/320 mg daily.  10.Spiriva 2 puffs daily.  11.O2 two L nasal cannula.  12.Glucosamine 1 daily.  13.Calcium and vitamin D 600/400 international units 1 p.o. b.i.d.  14.Vitamin D 1000 units per day.  15.Forteo 20 mcg per day.   OTHER MEDICAL PROBLEMS:  1. Chronic obesity, though she has lost 20 pounds recently.  2. Hypertension.  3. History of pulmonary embolus.  4. Lifelong Coumadin.  5.  O2-dependent COPD.  6. She had a catheterization in 2002, which showed normal coronary      arteries.  Her last stress Myoview was in November 2007, EF of 71%      with no scar ischemia.   SOCIAL HISTORY:  She lives in [place].  Her son brings her to the doctor  today.  She has a history of remote tobacco.  She has trouble with  obesity all her life.  She continues to work.   REVIEW OF SYSTEMS:  Negative other than the HPI.  She denies  specifically any bleeding or melena.   PHYSICAL EXAMINATION:  GENERAL:  She is really in no acute distress  today.  She is a very pleasant, overweight lady, in no acute distress.  VITAL SIGNS:  Blood pressure 130/100 in the right arm, her pulse is 130-  140.  EKG confirms atrial fib with rapid ventricular  rate.  There are no  acute changes.  O2 sats 98% on 2 L and she weighs 256 pounds.  HEENT:  Skin color is good.  She is not sweaty or clammy.  Alert and  oriented x3, wears nasal cannula.  Dentition in satisfactory condition,  otherwise HEENT unremarkable.  NECK:  Supple.  Carotid upstrokes are equal bilaterally without bruits.  Thyroid is not enlarged.  Trachea is midline.  CHEST/LUNGS:  Decreased breath sounds throughout.  No rales or rhonchi  or wheezes.  HEART:  A poorly appreciated PMI, regular rate and rhythm.  No gallop.  ABDOMEN:  Obese with good bowel sounds.  Organomegaly could not be  appreciated.  EXTREMITIES:  Trace edema.  Pulses were present, but reduced.  No sign  of DVT.  NEUROLOGIC:  Intact.  SKIN:  A few ecchymoses.   ASSESSMENT:  Paroxysmal atrial fibrillation with rapid ventricular rate  associated with increased dyspnea, lightheadedness, and dizziness, and  some aching in her jaw.  She has a history of normal coronary arteries  and had a history of a negative stress Myoview in 2007 though she could  have some coronary artery disease.  There are no acute changes on her  EKG.   Other problems as mentioned above.   PLAN:   1. Admit to telemetry at [facility].  2. Intravenous diltiazem 20 mg bolus and 10 mg per hour to keep heart      rate less than 100 in hopes of converting to sinus rhythm.  3. Continue other medications.  4. Check baseline blood work including cardiac markers.      Thomas C. Daleen Squibb, MD, [facility]  Electronically Signed     TCW/MEDQ  D:  06/13/2008  T:  06/14/2008  Job:  [id number]

## 2010-05-26 NOTE — Assessment & Plan Note (Signed)
[facility]                             PULMONARY OFFICE NOTE   JALASIA, ESKRIDGE                       MRN:          [id number]  DATE:09/16/2006                            DOB:          12/08/1950    HISTORY OF PRESENT ILLNESS:  The patient is a 60 year old white female  patient of Dr. Thurston Hole who has moderate COPD with PFTs with an FEV1 of  1.29L, which is 55% of predicted, who presents today for a followup.  The patient continues to have great difficulties with severe shortness  of breath with minimum activity.  The patient reports that she is unable  to do light tasks without becoming significantly short of breath and  having to rest.  The patient is maintained on Symbicort 80/4.5 two puffs  twice daily.  The patient continues to have difficulties with current  cough, hoarseness.  The patient denies any fever, purulent sputum, chest  pain  The patient previously had been taken off of bisphosphonate and  ACE inhibitors due to suspected upper airway instability.  The patient  recently was restarted back on her bisphosphonate.  The patient has been  out of work for greater than 1 year due to her extensive shortness of  breath and inability to perform simple tasks without significant  shortness of breath.   PAST MEDICAL HISTORY:  Reviewed.   CURRENT MEDICATIONS:  Reviewed.   PHYSICAL EXAM:  The patient is a pleasant morbidly obese female in no  acute distress.  Temperature is 99.3, blood pressure 142/86, O2 saturation is 90% on room  air.  Ambulatory O2 saturation with 1 lap was down to 84%.  HEENT:  Unremarkable.  NECK:  Supple without cervical adenopathy.  No JVD.  LUNGS:  Reveal diminished breath sounds at the bases.  CARDIAC:  Regular rate.  ABDOMEN:  Soft and nontender.  EXTREMITIES:  Warm without any edema.   DATA:  In-office spirometry reveals an FEV1 of 0.82, which is 30% of  predicted, with an FEV1 and FVC ratio at 53%.  Recent lab  work, Nutritional therapist  showed a bicarb level at 35.   IMPRESSION AND PLAN:  1. Moderate chronic obstructive pulmonary disease combined with      restriction from obesity by PFTs with hypoxic respiratory failure.      The patient will begin on continuous oxygen therapy at 2L and      continue on Symbicort 80/4.5 two puffs twice daily.  The patient      may also be a candidate for Spiriva and we will evaluate at her      next office visit.  However, the patient has quite a difficulty      affording her medications.  It does not appear that the patient      will be able to return to work, and will need to seek disability      due to her underlying severe lung disease, and need now for      continuous oxygen therapy.  2. Upper airway instability with recurrent cough and hoarseness.  The  patient is recommended to hold her bisphosphonate for now due to      the potential for airway irritation.  If cannot rechallenge with      bisphosphonate, may be a candidate for Reclast.  The patient has      been recommended to use Mucinex DM, tramadol as needed for cough      control, and add in Zyrtec 10 mg at bedtime as needed for post-      nasal drip symptoms.  If symptoms persist with this aggressive      cough and rhinitis prevention, along with her reflux prevention,      may then need to refer to ENT for evaluation.  The patient does      have a history of smoking and will need to be evaluated if      hoarseness does not resolve.      Rubye Oaks, NP  Electronically Signed      Charlaine Dalton. Sherene Sires, MD, [facility]  Electronically Signed   TP/MedQ  DD: 09/16/2006  DT: 09/16/2006  Job #: [id number]

## 2010-05-26 NOTE — Assessment & Plan Note (Signed)
[facility]                             PULMONARY OFFICE NOTE   CIARRAH, [name] [name]                       MRN:          [id number]  DATE:06/13/2006                            DOB:          [birth year]    PULMONARY/EXTENDED FOLLOW UP OFFICE VISIT:   HISTORY:  A 60 year old white female seen complaining of increasing  cough and congestion with dyspnea with frequent throat clearing.  She  also reported a drippy nose, for which Claritin helped some, and dyspnea  walking maybe 10 minutes.  She also reported she has not worked since  September of 2007 because she cannot bend or sweep at all without giving  out due to breathing difficulty.  She denies any exertional chest pain,  variability with weather, environmental change, and notes minimum  improvement after using albuterol.   MEDICATIONS:  Medications reviewed with the patient, dated June 13, 2006.  I am not convinced, however, that is it correct, as the patient has  shown very poor insight in terms of taking her medicines as they are  instructed on the calendar.   PHYSICAL EXAMINATION:  GENERAL:  She is an obese, ambulatory, depressed-  appearing white female with a hopeless, helpless affect and attitude.  VITAL SIGNS:  She had stable vital signs.  HEENT:  Unremarkable.  Oropharynx is clear.  LUNGS:  Lung fields reveal pseudo-wheeze but no true wheeze.  HEART:  Regular rhythm without murmur, gallop, or rub.  ABDOMEN:  Soft, benign.  EXTREMITIES:  Warm without calf tenderness, cyanosis or clubbing.   CHEST X-RAY:  Normal on June 13, 2006.   IMPRESSION:  Morbid obesity complicated by chronic dyspnea as this  patient's primary pulmonary problem.  However, I believe she has  significant underlying COPD with questionable asthmatic and pseudo-  asthmatic components for which it is difficult to work through a  differential diagnosis without more consistent adherence to her present  medical regimen.  For  now, I am going to use a keep it simple  principle, asking her to avoid biphosphonates (which may irritate the  upper airway via reflux mechanism), continue to treat reflux b.i.d. with  the consideration of adding Reglan to her present regimen on her next  visit if not improving.     Charlaine Dalton. Sherene Sires, MD, [facility]  Electronically Signed    MBW/MedQ  DD: 07/25/2006  DT: 07/26/2006  Job #: [id number]   cc:   Corwin Levins, MD

## 2010-05-26 NOTE — Assessment & Plan Note (Signed)
[facility]                            CARDIOLOGY OFFICE NOTE   NAOMY, ESHAM                       MRN:          [id number]  DATE:10/27/2006                            DOB:          [birth year]    Ms. Kreisler returns today for further management of the following issues:  1. Paroxysmal atrial fibrillation, well-controlled with Rythmol.  2. Chronic anticoagulation.  3. Hypertension.  4. Severe chronic obstructive lung disease, now on O2.   She is still trying to get disability through her chronic obstructive  pulmonary disease.   She just feels very fatigued. She has had no syncope or pre-syncope. She  has had a few palpitations. No chest pain, orthopnea. She does get  peripheral edema at the end of the day.   MEDICATIONS:  1. Furosemide 40 mg a day.  2. Warfarin per instruction.  3. Potassium 10 mEq a day.  4. Fluoxetine 40 mg a day.  5. Prilosec 40 mg a day.  6. Rythmol 325 mg p.o. b.i.d.  7. Veramyst.  8. Symbicort.  9. Amlodipine 10 mg a day.  10.Diovan 160 daily.  11.Spiriva one daily.   Her blood pressure is 124/88, pulse 81 and regular.  EKG is showing normal sinus rhythm with unifocal PVCs. Her QT is  unchanged as is her PR interval.  Weight is 270.  HEENT: Unchanged. Carotid upstrokes are equal bilaterally without  bruits. No JVD. Thyroid is not enlarged.  NECK: Supple.  LUNGS:  Clear.  HEART: Reveals soft S1, S2 with an occasional extra systole.  ABDOMEN: Obese. Organomegaly could not be assessed.  EXTREMITIES: Reveal trace edema. She has some superficial varicosities.  There is no sign of DVT. Pulses are present.  NEURO: Grossly intact.   ASSESSMENT/PLAN:  Ms. Biber is doing well from a cardiac standpoint on  her current medications. I have made no changes. I have asked her to  continue to try to pursue her disability now that she is on home oxygen.  I will see her back in six months.     Thomas C. Daleen Squibb, MD,  [facility]  Electronically Signed    TCW/MedQ  DD: 10/27/2006  DT: 10/27/2006  Job #: [id number]

## 2010-05-26 NOTE — Assessment & Plan Note (Signed)
[facility]                            CARDIOLOGY OFFICE NOTE   JUNI, GLAAB                       MRN:          [id number]  DATE:07/31/2007                            DOB:          [birth year]    HISTORY:  Ms. Rinks comes in today because of increased shortness of  breath.  She did not know whether this was her lungs or atrial  fibrillation.   She has a history of paroxysmal atrial fibrillation, well controlled  with Rythmol.  She is on chronic anticoagulation.  She has a history of  hypertension and severe chronic obstructive lung disease, on O2.   I saw her last in October 2009.   She has been having increased shortness of breath and was concerned it  might be her heart.  She denies any chest pain or tachypalpitations.  She has had a catheterization in the past, which showed no major  coronary artery disease.  She has had a stress Myoview on December 05, 2005, which showed EF of 71% with no sign of scar or ischemia.   MEDICATIONS:  1. Furosemide 40 mg a day.  2. Warfarin as directed.  3. Potassium 10 mEq a day.  4. Fluoxetine 40 mg a day.  5. Omeprazole 20 mg b.i.d.  6. Rythmol 325 mg b.i.d.  7. Veramyst nasal spray.  8. Symbicort.  9. Exforge.  10.Spiriva.  11.O2 at 2 liters of nasal cannula.  12.Fish oil.  13.Glucosamine.   PHYSICAL EXAMINATION:  VITAL SIGNS:  Her blood pressure is 123/81 and  her pulse is 82.  Her weight is up to 277.  HEENT:  She is wearing O2.  Rest of her HEENT is unchanged.  SKIN:  Warm and dry.  GENERAL:  She is alert and oriented x3.  NECK:  Carotids are full without bruits.  No JVD.  Thyroid is not  enlarged.  Trachea is midline.  LUNGS:  Decreased breath sounds throughout without rhonchi or rales.  HEART:  Reveals a poorly appreciated PMI.  ABDOMEN:  Obese with good bowel sounds.  Organomegaly could not be  assessed.  EXTREMITIES:  Have 1+ pretibial edema.  Pulses are intact.  NEURO:  Grossly  intact.   EKG shows sinus rhythm with frequent PVCs, which is unchanged.  Her PR,  QRS, and QTc intervals are normal.   PLAN:  Ms. Blok maintained sinus rhythm on Rythmol.  She has  occasional PVCs, which are asymptomatic.  Blood work has been  unremarkable.  We will plan on seeing her back in 6 months.     Thomas C. Daleen Squibb, MD, [facility]  Electronically Signed    TCW/MedQ  DD: 07/31/2007  DT: 08/01/2007  Job #: [id number]

## 2010-05-26 NOTE — Op Note (Signed)
[name] [name], [name] [name] NO.:  [id number]   MEDICAL RECORD NO.:  [id number]          PATIENT TYPE:  INP   LOCATION:  2502                         FACILITY:  [facility]   PHYSICIAN:  Doylene Canning. Ladona Ridgel, MD    DATE OF BIRTH:  [birth year]   DATE OF PROCEDURE:  08/12/2008  DATE OF DISCHARGE:                               OPERATIVE REPORT   PROCEDURE PERFORMED:  Electrophysiologic study and RF catheter ablation  of the atrial flutter isthmus.   INTRODUCTION:  The patient is a 60 year old woman who has a history of  atrial fibrillation who was doing very nicely on Rythmol who presented  to the office several weeks ago in atrial flutter with a rapid  ventricular response.  This was typical 2:1 atrial flutter at a rate of  about 130 beats per minute.  She had been maintained on Coumadin with  therapeutic INRs.  The patient did spontaneously return back to sinus  rhythm and is now referred for catheter ablation of her atrial flutter  which has been present despite medical therapy.   DESCRIPTION OF PROCEDURE:  After informed consent was obtained, the  patient was taken to the diagnostic EP lab in a fasting state.  After  usual preparation and draping, intravenous fentanyl and midazolam was  given for sedation.  A 6-French hexapolar catheter was inserted  percutaneously into the right jugular vein and advanced to the coronary  sinus.  A 7-French quadripolar Halo catheter was inserted percutaneously  in the right femoral vein and advanced under fluoroscopic guidance into  the right atrium.  A 6-French quadripolar catheter was inserted  percutaneously in the right femoral vein and advanced to the His bundle  region.  After measurement of the basic intervals, programmed atrial  stimulation was carried out from the coronary sinus in the high right  atrium at a basic drive cycle length of 147 milliseconds.  The S1-S2  interval stepwise decreased down to 260 milliseconds where the AV node  ERP was observed.  During programmed atrial stimulation, there were no  AH jumps and no echo beats noted.  There was no inducible SVT.  Next,  rapid atrial pacing was carried out from the coronary sinus in the high  right atrium at a pacing cycle length of 600 milliseconds and stepwise  decreased down to 300 milliseconds where AV Wenckebach was observed.  During rapid atrial pacing, the PR interval was less than the RR  interval and there was initially no inducible SVT.  Additional rapid  atrial pacing was then carried out from 290 milliseconds down to 240  milliseconds resulting in the initiation of atrial fibrillation.  It  should be noted that there was initial attempts to organize the AFib  into flutter, however, this was nonsustained, although I cannot state  for certainty that there was not at some point transition from typical  atrial flutter to atypical atrial flutter to AFib but the AFib remained  the dominant rhythm.  Lopressor 5 mg was then given to the patient but  this had no effect on her AFib and she was  subsequently deeper sedated  with fentanyl and Versed and DC cardioverted restoring sinus rhythm.  At  this point, a 7-French quadripolar ablation catheter was maneuvered  under fluoroscopic guidance into the right atrium and mapping of the  atrial flutter isthmus was carried out.  The atrial flutter isthmus was  fairly large.  Its orientation was fairly standard, however.  A total of  50 RF energy applications were then delivered to the usual atrial  flutter isthmus.  This resulted in the creation of atrial flutter  isthmus block (bidirectional).  The patient was observed for  approximately 15 minutes and during this time there was no recurrent  atrial flutter isthmus conduction.  The catheters were then removed,  hemostasis was assured, and the patient was returned to her room in  satisfactory condition.   COMPLICATIONS:  There were no immediate procedure  complications.   RESULTS:  A.  Baseline ECG:  The baseline ECG demonstrates sinus rhythm  with normal axis and intervals.  B.  Baseline intervals:  The HV interval was 42 milliseconds.  The QRS  duration was 70 milliseconds.  The PR interval was 170 milliseconds.  Sinus node cycle length was approximately 870 milliseconds.  C.  Rapid ventricular pacing:  Following ablation, rapid ventricular  pacing was carried out from the RV apex demonstrating a VA Wenckebach  cycle length of 440 milliseconds.  During rapid ventricular pacing, the  activation sequence was midline decremental.  D.  Programmed ventricular stimulation:  Programmed ventricular  stimulation was carried out from the RV apex at a basic drive cycle  length of 409 milliseconds.  The S1-S2 interval stepwise decreased down  to 340 milliseconds where the retrograde AV node ERP was observed.  During programmed ventricular stimulation, the atrial activation  sequence was midline and decremental and it should be noted that there  was evidence of isthmus block when pacing the ventricle.  E.  Rapid atrial pacing:  Rapid atrial pacing was carried out from the  coronary sinus in the high right atrium at a pacing cycle length of 600  milliseconds and stepwise decreased down to 300 milliseconds where AV  Wenckebach was observed.  During rapid atrial pacing, the PR interval  remained less than or equal to the RR interval and there was no  inducible SVT with the exception of the inducible AFib.  F.  Programmed atrial stimulation:  Programmed atrial stimulation was  carried out from the coronary sinus at a basic drive cycle length of 811  milliseconds with the S1-S2 interval stepwise decreased down to 260  milliseconds where atrial refractoriness was observed.  During  programmed atrial stimulation, there were no AH jumps, no echo beats,  and no inducible SVT.  G.  Arrhythmias observed.  1. AFib initiation was rapid atrial pacing, duration  was sustained,      termination was with DC cardioversion.      a.     Mapping:  Mapping of the atrial flutter isthmus was       unusually large with a normal orientation.      b.     RF energy application:  A total of 15 RF energy applications       were delivered to the usual atrial flutter isthmus resulting in       the creation of bidirectional atrial flutter isthmus block.   CONCLUSION:  This study demonstrates successful electrophysiologic study  and RF catheter ablation of the atrial flutter isthmus in a patient with  documented atrial flutter (typical) who was otherwise maintaining sinus  rhythm on Rythmol therapy.      Doylene Canning. Ladona Ridgel, MD  Electronically Signed     Doylene Canning. Ladona Ridgel, MD  Electronically Signed    GWT/MEDQ  D:  08/12/2008  T:  08/13/2008  Job:  [id number]   cc:   Thomas C. Daleen Squibb, MD, [facility]  Corwin Levins, MD

## 2010-05-26 NOTE — Discharge Summary (Signed)
NAME:  [name] [name], [name] [name] NO.:  [id number]   MEDICAL RECORD NO.:  [id number]          PATIENT TYPE:  OIB   LOCATION:  6735                         FACILITY:  [facility]   PHYSICIAN:  Nelda Bucks, MD DATE OF BIRTH:  03-23-1950   DATE OF ADMISSION:  02/23/2008  DATE OF DISCHARGE:  02/27/2008                               DISCHARGE SUMMARY   DISCHARGE DIAGNOSES:  1. Acute exacerbation of chronic obstructive pulmonary disease.  2. Open reduction internal fixation left radius.  3. History of deep vein thrombosis/pulmonary embolism.  4. History of paroxysmal atrial fibrillation.  5. Anxiety and depression.   HISTORY OF PRESENT ILLNESS:  This is a 60 year old white female with a  history of COPD who was admitted to the hospital February 12 for a left  ORIF for distal radius fracture.  She underwent general anesthesia for  the ORIF and postoperatively developed dyspnea, cough, and chest pain  with coughing.  She was admitted for further evaluation and treatment of  an acute exacerbation of COPD.   LABORATORY DATA:  CBC:  White blood cells 7.9, red blood cells 3.88,  hemoglobin 11.7, hematocrit 33.4, platelets 210.  PT 13.7, INR 1.0.  Basic metabolic panel:  Sodium 140, potassium 3.8, chloride 101, CO2 30,  glucose 78, BUN 12, creatinine 0.01.   HOSPITAL COURSE BY DISCHARGE DIAGNOSES:  1. Acute exacerbation of chronic obstructive pulmonary disease.  Ms.      Pickert was treated with IV steroids, bronchodilators.  This acute      exacerbation has resolved, and upon discharge, she will continue      her medicine regimen including Spiriva and Symbicort.  She is not      requiring any supplemental O2, and she will follow up with [name]      Parrett with [facility] in the outpatient setting after      discharge.  2. Open reduction internal fixation of left radius.  This has been      treated and followed by ortho, and she will continue to follow up      with ortho on  an outpatient basis.  3. History of deep vein thrombosis and pulmonary embolism.  She is on      chronic Coumadin and will be followed up by her primary care      physician.  4. History of paroxysmal atrial fibrillation.  This will be controlled      with her outpatient medications and followed by her primary care      physician.  5. Anxiety and depression.  Again controlled at this point on her      outpatient medications, and she will continue to follow up with her      primary care.   DISCHARGE MEDICATIONS:  1. Calcium 600 and vitamin D 2 tablets daily.  2. Glucosamine chondroitin 1 tablet daily.  3. Xopenex HFA 1-2 puffs as needed for cough and shortness of breath.  4. Spiriva 18 mcg 1 puff daily.  5. Veramyst nasal spray 2 times daily as needed.  6. Symbicort 160/4.5 two puffs 2 times  a day.  7. Hydralazine 10 mg p.o. 3 times a day.  8. Tramadol 50 mg 1 tablet p.o. every 4 hours as needed.  9. Forteo 600 mcg by injection daily.  10.Potassium 10 mEq extended release daily 2 tablets.  11.Rythmol SR 325 mg 2 times daily.  12.Prednisone 20 mg for 2 days, then 10 mg for 2 days, then stop; this      is a new medication.  13.Continue Coumadin as directed by primary care physician.   DISCHARGE DIET:  Heart healthy.   ACTIVITY:  Increase as tolerated with precautions per orthopedics  postoperatively.   The patient is to follow up with Rubye Oaks, nurse practitioner, with  [facility] on Friday, February 19, and with orthopedics as  directed.   She is discharged in improved condition with her acute exacerbation of  COPD resolved.     ______________________________  Nelva Bush, N.P.      Nelda Bucks, MD  Electronically Signed    KW/MEDQ  D:  03/25/2008  T:  03/25/2008  Job:  [id number]   cc:   Johnette Abraham, MD

## 2010-05-26 NOTE — Consult Note (Signed)
NAMEMELAINE, MCPHEE                ACCOUNT NO.:  [id number]   MEDICAL RECORD NO.:  [id number]          PATIENT TYPE:  INP   LOCATION:  1827                         FACILITY:  [facility]   PHYSICIAN:  Charlcie Cradle. Delford Field, MD, FCCPDATE OF BIRTH:  04-Jul-1950   DATE OF CONSULTATION:  12/07/2006  DATE OF DISCHARGE:                                 CONSULTATION   CHIEF COMPLAINT:  Pleuritic chest pain, respiratory failure.   HISTORY OF PRESENT ILLNESS:  A 60 year old obese female who, while  working in the yard, noted increasing left-sided chest pain over the  last several days. Then suddenly overnight awoke in the in the late  evening on December 06, 2006, with left-sided pleuritic chest pain and  increased dyspnea.  She was brought to the emergency room, found to have  hypercarbic respiratory failure with pH 7.23, pCO2 76, pO2 94 on  bilevel. Also had decreased level of consciousness. Given Narcan  initially and then placed on the BiPAP.  We were asked to see and admit  to the intensive care unit with her respiratory failure, previous  history of moderate chronic obstructive lung disease managed with  Symbicort and p.r.n. bronchodilator, also history of morbid obesity,  history of reflux disease and upper airway instability aggravated by ACE  inhibitor use and bisphosphates, both of which have been discontinued,  now on the ARB Diovan.  She really does not have much in the way of  cough, fever, chills or sweats.  Some lower extremity swelling and edema  is noted.  Dilaudid in the emergency room resulted in decreased levels  of consciousness.  She did receive Narcan for this.  Currently she is pH  7.23, pCO2 78, pO2 82 on 2 liters via bilevel. She has received Avelox.  At this point, we are asked to admit to the ICU.   PAST HISTORY:   MEDICAL HISTORY:  1,  Obesity.  1. Hypertension.  2. Reflux disease.  3. PAF.  4. Depression.  5. Moderate COPD with asthmatic component, FEV-1 of 1.3, upper  airway      instability.   ALLERGIES:  1. PENICILLIN.  2. ACE INHIBITOR causes cough and upper airway worsening.   SOCIAL HISTORY:  Ex-smoker, does not drink, sedentary lifestyle,  deconditioned, oxygen-dependent COPD.   MEDICATIONS PRIOR TO ADMISSION:  1. Furosemide 40 mg daily.  2. Warfarin daily.  3. Potassium daily.  4. Paxil daily.  5. Protonix b.i.d.  6. Rythmol b.i.d.  7. Veramyst b.i.d.  8. Symbicort 80/4.5, two sprays b.i.d.  9. Amlodipine daily.  10.Diovan daily.  11.Spiriva daily.   PHYSICAL EXAMINATION:  VITAL SIGNS:  Temperature 97, blood pressure  134/82, respirations 25 on bilevel, 0.4 FIO2 with saturation 95% full-  face mask.  GENERAL:  Does moan intermittently, has decreased level of conscious.  Will awaken to interact.  HEENT:  Normocephalic.  NECK:  No jugular venous distension, no lymphadenopathy.  CHEST:  Crackles at the bases.  CARDIAC:  Exam showed regular rate and rhythm without S3, normal S1-S2.  ABDOMEN:  Protuberant.  Bowel sounds active.  EXTREMITIES:  Showed mild  lower extremity edema, 2+ pulses, perfused.  NEUROLOGIC:  Awake and alert.  Moves all fours.   LABORATORY DATA:  pH 7.23, pCO2 78, pO2 82. Sodium 140, potassium 3.6,  chloride 104, CO2 30, BUN 20, creatinine 1.2, blood sugar 110.  Hemoglobin 12.4, white count 7.2.  Pro time 26.3, INR 2.3.   Chest x-ray showed reduction in lung volumes.   CT scan chest shows no pulmonary embolism.   IMPRESSION:  1. Obesity-hypoventilation syndrome.  2. Possible sleep apnea.  3. Morbid obesity.  4. Upper airway instability.  5. Left-sided chest pain, likely musculoskeletal.  6. No pulmonary embolism on CT.  7. Acute on chronic hypercarbic respiratory failure.  8. Reflux disease.   RECOMMENDATIONS:  1. Bilevel support b.i.d.  2. Protonix.  3. No ACE inhibitors.  4. No bisphosphonates.  5. Give Toradol or Ultram for pain, avoid narcotics.  6. Nebulizer treatments.      Charlcie Cradle  Delford Field, MD, [facility]  Electronically Signed     PEW/MEDQ  D:  12/07/2006  T:  12/07/2006  Job:  [id number]   cc:   Charlaine Dalton. Sherene Sires, MD, Tonny Bollman  Corwin Levins, MD

## 2010-05-26 NOTE — Assessment & Plan Note (Signed)
[facility]                             PULMONARY OFFICE NOTE   SHON, MANSOURI                       MRN:          [id number]  DATE:10/25/2006                            DOB:          [birth year]    HISTORY OF PRESENT ILLNESS:  The patient is a 60 year old white female  patient of Dr. Thurston Hole with a known history of moderate COPD with a FEV1  of 1.29 liters which was 59% of the predicted, who returns today for  followup.  The patient has recently been having difficulty with  increased shortness of breath, cough, and hoarseness.  The patient has  been transitioned off her ACE inhibitor over to Diovan and recently  placed on Spiriva.  The patient reports that she is doing exceptionally  well, and breathing has returned back to her baseline, and coughing is  almost 90% improved.  The patient has brought all of her medications and  today reviewed which are current with our medication list.   PAST MEDICAL HISTORY:  Reviewed.   CURRENT MEDICATIONS:  Reviewed.   PHYSICAL EXAMINATION:  The patient is a pleasant obese female in no  acute distress.  She is afebrile with stable vital signs.  O2 saturation is 97% on 2 L.  HEENT:  Unremarkable.  NECK:  Supple without cervical adenopathy.  No JVD.  LUNG SOUNDS:  Diminished at bases, otherwise clear.  CARDIAC:  Regular rate.  ABDOMEN:  Soft and nontender.  EXTREMITIES:  Warm without any edema.   IMPRESSION AND PLAN:  1. Chronic obstructive pulmonary disease currently well compensated on      the present regimen.  The patient will return back in 1 month with      Dr. Sherene Sires or sooner if needed.  Upper airway instability with      recurrent cough and hoarseness, seems to be improved off of      bisphosphonates, ACE inhibitors and reflux and rhinitis prevention.      The patient will maintain on her present regimen followed back up      in 4 weeks or sooner if needed.  2. Complex medication regimen.  The  patient's medications reviewed in      detail.  Patient      education provided.  The computerized medication calendar was      adjusted accordingly, will review with the patient.      Rubye Oaks, NP  Electronically Signed      Charlaine Dalton. Sherene Sires, MD, [facility]  Electronically Signed   TP/MedQ  DD: 10/25/2006  DT: 10/25/2006  Job #: [id number]

## 2010-05-26 NOTE — Assessment & Plan Note (Signed)
[facility]                            CARDIOLOGY OFFICE NOTE   [name] [name], [name] [name]                       MRN:          [id number]  DATE:01/24/2008                            DOB:          [birth year]    [name] [name] comes in today.  She was just discharged from the hospital  for pneumonia.  Please refer to the discharge summary for details as  well as her extensive medication discharge list.   Potassium was 3.4 on admission.  Her INR was therapeutic.   She just finished up a course of Avelox.   She is having some palpitations.   PROBLEM LIST:  1. History of paroxysmal atrial fibrillation.  2. History of pulmonary embolus.  3. Anticoagulation.  4. Morbid obesity, O2 dependent chronic obstructive pulmonary disease.  5. Hypertension.   Her meds are extensive and are listed on his medication list as well as  a discharge summary dated January 15, 2008.  I will not repeat them at  this time.   I do note that she was sent home on Rythmol 325 mg p.o. daily instead of  b.i.d.  She is also just on 10 mEq of potassium a day and takes 40 mg of  furosemide a day.   PHYSICAL EXAMINATION:  GENERAL:  Today, she is in no acute distress.  She is wearing O2.  Alert and oriented x3, wearing O2.  VITAL SIGNS:  Her blood pressure 130/64, pulse is 93 and irregular.  Every other beat is an extra systole.  HEENT:  Pale.  SKIN:  Warm and dry.  NECK:  Supple.  Carotid upstrokes are equal bilaterally without bruits,  no JVD.  Thyroid is not enlarged.  Trachea is midline.  LUNGS:  Clear breath sounds with no rhonchi or wheezes.  HEART:  Nonappreciated PMI, soft S1 and S2, irregular rate and rhythm.  ABDOMEN:  Obese with good bowel sounds.  Organomegaly could not be  appreciated.  EXTREMITIES:  1+ edema.  Pulses are intact.  NEURO:  Intact.  No sign of DVT.   EKG shows sinus rhythm with ventricular bigeminy.  There are no acute  changes.   PR interval is 142, QRS  is 90, QTC is 440.   ASSESSMENT AND PLAN:  [name] [name] is recovering from pneumonia.  I  suspect her potassium is still low.  She is also on only once a day  Rythmol, which may be also aggravating her ventricular ectopy.  Of  course, she is not in atrial fibrillation.   PLAN:  1. Increase her Rythmol to 325 mg p.o. b.i.d.  2. Increase potassium to 20 mEq extra today and then increase to a      daily dose of 20 mEq a day.  3. Check Chem-7 today.  4. See me back in 4 weeks.     Thomas C. Daleen Squibb, MD, [facility]  Electronically Signed    TCW/MedQ  DD: 01/24/2008  DT: 01/25/2008  Job #: [id number]

## 2010-05-26 NOTE — H&P (Signed)
NAME:  [name] [name], [name] [name]                ACCOUNT NO.:  [id number]   MEDICAL RECORD NO.:  [id number]          PATIENT TYPE:  OIB   LOCATION:  6735                         FACILITY:  [facility]   PHYSICIAN:  Casimiro Needle B. Sherene Sires, MD, FCCPDATE OF BIRTH:  05-Nov-1950   DATE OF ADMISSION:  02/23/2008  DATE OF DISCHARGE:                              HISTORY & PHYSICAL   CHIEF COMPLAINT:  Dyspnea, hacking cough, and chest pain with coughing.   HISTORY:  A 60 year old white female who quit smoking in 2003 with a  diagnosis of COPD by PFTs July 2008 with an FEV-1 of 55%, ratio 53%, but  relatively well compensated on Spiriva and Symbicort 160 two puffs  b.i.d.  She underwent general anesthesia today for repair of a left  wrist fracture and postoperatively developed severe coughing paroxysms  associated with dyspnea and chest discomfort during coughing.  She  required 4 liters per minute where normally she requires 2 liters per  minute and Anesthesia recommended she be admitted postoperatively rather  than discharge home, which was the original plan.   On my arrival, the patient was receiving Dilaudid for wrist pain, but  did have mild increased dyspnea and a hacking cough lying back at 30  degrees.  The cough had a dry quality to it.  She was quite hoarse.  She  denied any nausea, vomiting, or other abdominal complaints, itching or  sneezing, or subjective wheezing.   PAST MEDICAL HISTORY:  1. Morbid obesity.  2. Chronic rhinitis.  3. DVT complicated by pulmonary embolism remotely.  4. COPD with PFTs as outlined above.  5. Hypertension.  6. PAF.  7. Followed by Dr. Daleen Squibb.  8. History of atypical chest pain with completely normal heart      catheterization in 2003 and a negative stress Myoview done      September 2007.  9. Hyperlipidemia.   SOCIAL HISTORY:  She quit smoking in 2003.  She is disabled former  Scientist, product/process development.   FAMILY HISTORY:  Positive for asthma in her brothers and her son.  Positive  heart disease in her mother.  Throat cancer in her brother.   ALLERGIES:  PENICILLIN.   MEDICATIONS:  1. Furosemide 40 mg one daily.  2. Coumadin 7.5 mg one daily (held for the wrist surgery).  3. Potassium 10 mEq q.a.m.  4. Fluoxetine 40 mg daily.  5. Omeprazole 20 mg before breakfast daily.  6. Rythmol 325 b.i.d.  7. Veramyst nasal spray b.i.d.  8. Symbicort 160/4.5 two puffs b.i.d.  9. Exforge 10/320 one daily.  10.Spiriva 1 capsule daily.  11.Oxygen 2 liters continuously.  12.Glucosamine chondroitin 1 capsule daily.  13.Calcium carbonate with vitamin D one b.i.d.  14.Vitamin D 1000 units q.a.m.  15.Forteo once daily 20 mcg subcutaneously.  16.Xopenex 1.25 mg every 6 hours as needed.  17.Tramadol 50 mg one every 4 hours as needed.  18.Zyrtec 10 mg nightly.   REVIEW OF SYSTEMS:  Taken in detail and negative presently.  She has  been able to swallow ice chips and sips since intubation, although it is  still quite hoarse.  PHYSICAL EXAMINATION:  GENERAL:  This is a hoarse uncomfortable-  appearing white female lying back at 30 degrees head of bed with  elevated left arm in a cast.  She is afebrile.  VITAL SIGNS:  Normal.  Pulse rate of 120 which is sinus tachycardia on  the monitor.  HEENT:  Unremarkable.  Pharynx clear.  Trachea is midline.  NECK:  Supple without cervical adenopathy or tenderness.  LUNGS:  Lung fields reveal distant wheeze bilaterally, expiratory  greater than inspiratory.  There is moderate increased expiratory time.  HEART:  Regular rhythm without murmur, gallop, or rub present.  ABDOMEN:  Obese, otherwise benign with normal excursion.  EXTREMITIES:  Warm without calf tenderness, cyanosis, clubbing, or  edema.   Hemosaturation is adequate on 4 liters.   Chest x-ray and labs are pending.   EKG reveals sinus tachycardia.  No ischemic change.   IMPRESSION:  1. Chronic obstructive pulmonary disease exacerbation associated with      severe cough and  musculoskeletal chest pain.  It occurred after      intubation for the wrist surgery.  I believe she would benefit from      several days of inpatient treatment to include around-the-clock      nebulizer therapy and possibly Solu-Medrol.  2. Sinus tachycardia with a history of atrial fibrillation.  We will      place her on Lovenox for now, but she will need to be switched back      over to Coumadin when ok with orthopedic surgery as she does have      history of DVT and pulmonary embolism.  3. Chest pain is typical musculoskeletal, occurs during coughing with      a completely normal left heart catheterization done within the last      7 years  and negative Myoview in 2007.      Charlaine Dalton. Sherene Sires, MD, [facility]  Electronically Signed    MBW/MEDQ  D:  02/23/2008  T:  02/24/2008  Job:  [id number]

## 2010-05-26 NOTE — H&P (Signed)
NAME:  KRISTYNA, BRADSTREET NO.:  [id number]   MEDICAL RECORD NO.:  [id number]          PATIENT TYPE:  EMS   LOCATION:  MAJO                         FACILITY:  [facility]   PHYSICIAN:  Hollice Espy, M.D.DATE OF BIRTH:  December 17, 1950   DATE OF ADMISSION:  12/07/2006  DATE OF DISCHARGE:                              HISTORY & PHYSICAL   PRIMARY CARE PHYSICIAN:  Corwin Levins, MD   PULMONOLOGIST:  Charlaine Dalton. Wert, MD   CHIEF COMPLAINT:  Shortness of breath and chest pain.   NOTE:  Please note, previous dictation was partially dictated before.  The phone line was disconnected.  I will redictate.   The patient is a 60 year old white female with past medical history of  COPD, morbid obesity and paroxysmal atrial fibrillation, who for the  past few days has noted some mild increased lower extremity swelling but  today this morning she had the sudden onset of severe chest pain, sharp  and stabbing, with also some pressure component on the left side of her  breast, nonradiating, and also significant shortness of breath even at  rest.  It got to the point where the patient was unable to catch her  breath and continued to have pain and called paramedics and was brought  in.  The patient appeared to be coming in with 10/10 chest pain.  Initially there was a concern that she was having an acute MI; however,  EKG showed a normal sinus rhythm with occasional PVCs and cardiac  markers unremarkable.  Her blood pressure initially was 168/79; however,  she was saturating 98% on room air with a respiratory rate of 22.  Labs  were drawn on the patient and, again, she was found to have normal  cardiac markers, normal white count, no shift.  With the rest of her  labs being unremarkable, a D-dimer was actually found to be normal at  less than 0.22 and her INR was found to be therapeutic at 2.3, which she  takes Coumadin for chronic anticoagulation for atrial fibrillation.  A  chest x-ray  done showed some mild congestion with a questionable finding  of bronchitis but no evidence of any overt pulmonary edema.  Because of  the severe chest pain and sudden-onset nature as well as associated  shortness of breath despite the normal D-dimer, the patient was sent to  have a CT angio of the chest but, again a pulmonary embolus was not  seen.  The patient received morphine, aspirin, nitroglycerin and  Dilaudid in order to treat her pain, which over time has decrease down  to a 5.  Because of the increased pain medication she is somewhat drowsy  and her saturations initially were dropping and requiring an increase in  her oxygen level.  She felt that her pain is down now to about a 5 but  continues to persist.   She denies any headaches or vision changes, no dysphagia, no  palpitations.  Her shortness of breath is better.  No wheezing or  coughing.  No abdominal pain.  No hematuria or dysuria, constipation or  diarrhea.  No focal extremity numbness, weakness or pain.  Review of  systems otherwise negative.   PAST MEDICAL HISTORY:  1. COPD.  2. Obesity.  3. Hypertension.  4. History of paroxysmal atrial fibrillation.  5. GERD.  6. Depression.   MEDICATIONS:  1. Lasix 40 mg.  2. Coumadin 7.5 mg.  3. Prozac 20 mg.  4. Rythmol 325 mg b.i.d.  5. Spiriva 18 mcg.  6. Cardizem 180 mg.  7. Protonix 40 mg.   There is a note on her from [facility] from October 27, 2006,  that she should also be on Norvasc and Diovan but it is unclear if she  is still on these medications, as well as Lasix.   She has allergy to PENICILLIN.   SOCIAL HISTORY:  No alcohol, tobacco or drug use.   FAMILY HISTORY:  Noncontributory.   PHYSICAL EXAM:  VITALS ON ADMISSION:  Temperature 97.4, heart rate 78,  blood pressure 168/79, now down to 100/65, respirations 22, O2  saturation initially 98% on room air.  Since she has become drowsy  because of pain medications, she is only now saturating  about 92% on 4  L.  GENERAL:  She is somewhat drowsy but oriented x3.  HEENT:  Normocephalic, atraumatic.  Her mucous membrane is slightly dry.  Her airway is narrow.  HEART:  A regular rate and rhythm currently, S1, S2.  LUNGS:  Decreased breath sounds throughout.  She also notes some painful  inspiration with deep inspiration.  ABDOMEN:  Soft, obese, nontender, positive bowel sounds.  EXTREMITIES:  No clubbing, cyanosis, with a 1+ pitting edema.   LAB WORK:  White count 7.2, H&H 12.4 and 36, MCV of 86, platelet count  225.  Sodium 140, potassium 3.6, chloride 104, bicarb 29, BUN 20,  creatinine 1.2, glucose 110.  D-dimer less than 0.22.  INR 2.3.  CPK  52.7, MB less than 1, troponin I less than 0.05.  Second set is similar.  I have ordered an ABG, which is pending.  EKG, again, is as per HPI.   ASSESSMENT AND PLAN:  1. Sudden-onset severe chest pain and associated shortness of breath.      Very difficult to say what is going in with this patient.  A      pulmonary embolus has been ruled out from the workup.  Other      possibilities would include a late-presenting infarction.  We will      plan to place her on step-down with pain medication, repeat cardiac      markers, or versus perhaps severe bronchospasm/chronic obstructive      pulmonary disease exacerbation.  Will treat with nebulizer      treatments and oxygen and await ABG.  2. Paroxysmal atrial fibrillation.  The patient is therapeutic on      Coumadin as well as continue Cardizem.  3. Depression.  Continue Prozac.  4. Gastroesophageal reflux disease.  Continue Protonix.      Hollice Espy, M.D.  Electronically Signed     SKK/MEDQ  D:  12/07/2006  T:  12/07/2006  Job:  [id number]   cc:   Corwin Levins, MD  Charlaine Dalton. Sherene Sires, MD, FCCP

## 2010-05-26 NOTE — Assessment & Plan Note (Signed)
[facility]                             PULMONARY OFFICE NOTE   [name] [name], [name] [name]                       MRN:          [id number]  DATE:09/30/2006                            DOB:          Dec 06, 1950    HISTORY OF PRESENT ILLNESS:  The patient is a 60 year old white female  patient of Dr. Thurston Hole with moderate COPD with an FEV1 of 1.29L, which is  59% of predicted, who returns today for 2-week followup.  The patient is  being followed for persistent shortness of breath with activity.  Last  visit was found to have exertional hypoxemia with O2 saturations ranging  from 84% with activity and 90% at rest.  The patient was started on  continuous oxygen therapy at 2L.  The patient also had been having some  increased cough and hoarseness after re-starting her bisphosphonate.  The patient also had previously been intolerant to ACE inhibitors with  recurrent cough.  Since last visit, the patient had called in to her  primary care office, Dr. Jonny Ruiz, and because her new blood pressure pill,  Azor, was too expensive.  The patient was subsequently changed over to  Norvasc and benazepril.  The patient reports that, since starting this,  her dry  cough has increased further.  The patient denies any purulent  sputum, hemoptysis, orthopnea, PND, or leg swelling.   PAST MEDICAL HISTORY:  Reviewed.   CURRENT MEDICATIONS:  Reviewed.   PHYSICAL EXAM:  The patient is a morbidly obese female in no acute  distress.  She is afebrile with stable vital signs.  O2 saturation is 97% on 2L.  Weight is at 270.  HEENT:  Unremarkable.  NECK:  Supple without cervical adenopathy.  No JVD.  LUNGS:  Sounds reveal diminished breath sounds without any wheezing or  crackles.  CARDIAC:  Regular rate.  ABDOMEN:  Soft and obese.  EXTREMITIES:  Warm without any calf tenderness, cyanosis, clubbing.  There are venous insufficiency changes with trace edema.   IMPRESSION AND PLAN:  1.  Moderate chronic obstructive pulmonary disease with hypoxic      respiratory failure now on continuous oxygen therapy at 2L.  The      patient is recommended to continue on Symbicort 80/4.5 two puffs      twice daily and continuous oxygen therapy.  The patient will return      back in 2 weeks with Dr. Sherene Sires or sooner if needed.  2. Upper airways instability with recurrent cough and hoarseness.  The      patient is to continue to maintain off of her bisphosphonate.  Of      note,  the patient has recently been restarted back on ACE      inhibitor, and I have discussed the case with Dr. Sherene Sires.  We will      need to hold her ACE inhibitor at this time, and start Diovan 160      mg for blood pressure control.  The patient will continue to use      aggressive cough suppression regimen with Mucinex DM and  tramadol      as needed along with Zyrtec for rhinitis symptoms.  The patient      will return back here in 2 weeks with Dr. Sherene Sires or sooner if needed.  3. Complex medication regimen.  The patient's medications are reviewed      in detail.  Patient      education is provided.  A computerized medication calendar was      completed for this patient and reviewed in detail.      [name] Oaks, [name] [name]  Electronically Signed      [name] [name]. Sherene Sires, MD, [facility]  Electronically Signed   TP/MedQ  DD: 09/30/2006  DT: 09/30/2006  Job #: [id number]

## 2010-05-29 NOTE — Assessment & Plan Note (Signed)
[facility]                             PULMONARY OFFICE NOTE   [name] [name], [name] [name]                       MRN:          [id number]  DATE:03/01/2006                            DOB:          05/01/1950    PULMONARY/EXTENDED FOLLOWUP OFFICE VISIT   A 60 year old, white female here for me to fill out disability papers.  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  She does not think she can do any more than a  sitting job with her present complaints of chronic dyspnea on exertion  that are felt to be multifactorial but related to both obesity and COPD  status post smoking cessation in 2003.   Her main complaints now at this point are actually not dyspnea, unless  she bends over, but rather nasal congestion with frequent nosebleeds for  which she has been already treated with Veramyst 2 puffs b.i.d.  She is  also maintained on Symbicort 80/4.5 two puffs b.i.d. with no significant  nocturnal wheeze, fevers, chills, sweats, chest pain, or leg swelling.   Note she has been using Claritin for nasal congestion with no benefit.   PHYSICAL EXAMINATION:  She is a pleasant, ambulatory, obese white female  who has lost 16 pounds since her previous visit voluntarily.  She is afebrile, stable vital signs.  HEENT:  Remarkable for nonspecific turbinate edema.  Oropharynx is  clear.  NECK:  Supple without cervical adenopathy or tenderness.  Trachea is  midline.  No thyromegaly.  Lung fields are clear bilaterally to auscultation and percussion.  There is a regular rhythm without murmur, gallop, or rub.  ABDOMEN:  Soft, benign.  EXTREMITIES: Warm without calf tenderness, cyanosis, clubbing, or edema.   Spirometry is reviewed from December 01, 2005, and indicates a FEV of  51% predicted with a ratio of 51% and diffusing capacity of 52%.  We  walked this patient around the office.  She now makes it 2 full laps  before  dropping to 87%.   IMPRESSION:  1. Chronic obstructive pulmonary disease secondary to longstanding      smoking with an asthmatic component that has been fully reversed on      her present regimen.  With weight loss, she is able to walk further      before she desaturates but I do not believe it is reasonable to      think that she could be on her feet all day, bending over, picking      up objects etc.  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  2. Morbid obesity.  I am very encouraged by the fact she has lost 16      pounds with the  care of a nutritionist.  I have strongly encouraged      her to continue to lose weight below 200 pounds if possible.  3. Her main complaints are all related to rhinitis.  To help her with      this today, I changed her medication sheet to indicate she should      be only on the Veramyst 1 puff twice daily. to use Afrin before the      Veramyst for 5 days only and to use Xyzal 5mg  instead of Claritin 5      mg nightly as needed on a trial basis.  We will arrange for a sinus      CT scan and Ears, Nose, and Throat followup on an as needed basis.   I have also advised her if she has significant bleeding over what she  described (which is only minimal), she should stop the Coumadin and  contact the [facility].  Pulmonary followup could be every 3 months  sooner if needed.     Charlaine Dalton. Sherene Sires, MD, [facility]  Electronically Signed    MBW/MedQ  DD: 03/01/2006  DT: 03/02/2006  Job #: [id number]

## 2010-05-29 NOTE — Assessment & Plan Note (Signed)
[facility]                               PULMONARY OFFICE NOTE   MONCERRATH, BERHE                       MRN:          [id number]  DATE:12/01/2005                            DOB:          07/06/1950    PULMONARY/EXTENDED FOLLOWUP OFFICE VISIT   HISTORY:  A 60 year old white female with morbid obesity, returns for follow-  up evaluation with PFTs as requested having stopped smoking at the age of  84. She continues to be short of breath with exertion, but overall was  better on her present regimen until developing an acute upper respiratory  tract infection with purulent nasal discharge and hacking cough over the  last several days. She denies any fevers, chills, sweats, orthopnea, PND or  leg swelling.   For full inventory of medications, reviewed in detail using the medication  calendar format, please see face sheet, dated December 01, 2005.   PHYSICAL EXAMINATION:  She is an obese, ambulatory white female in no acute  distress. She has stable vital signs.  HEENT: Is remarkable for mild turbinate edema. Oropharynx is clear.  NECK: Supple without cervical adenopathy or tenderness. Trachea is midline.  No thyromegaly.  LUNGS: Lung fields perfectly clear bilaterally to auscultation and  percussion.  HEART: Regular rate and rhythm without murmur, gallop or rub present.  ABDOMEN: soft, benign.  EXTREMITIES: Warm without calf tenderness, cyanosis, clubbing or edema.   Spirometry is done today, despite her complaints of acute rhinitis. It  reveals an VEF1 of 51% predicted with a ratio of 51% and an 8% improvement  after bronchodilators. She also only has a 29% ERV and a diffusing capacity  of 52%.   IMPRESSION:  1. Morbid obesity and chronic obstructive pulmonary disease of moderate      severity with disproportionate reduction in diffusing capacity      consistent with an emphysematous component.  Given the combination of      obesity and  airflow obstruction with emphysema, I do not believe that      she can be expected to do anything but desk work and have filled out      her disability papers to that affect.  2. I believe the asthmatic component has been adequately controlled with      Symbicort. Strong evidence for this is the fact she is having no      breakthrough wheezing or need for Xopenex despite what otherwise would      be a typical trigger, that is problem #3.  3. Upper respiratory infection/rhinitis/tracheobronchitis. Recommended      doxycycline 100 mg b.i.d. for 7 days and Mucinex DM 2 b.i.d. p.r.n.      cough and congestion and added this to her action plan and p.r.n.      medicines on the medication calendar she brings with her.   I spent extra time reviewing each and every one of her maintenance  medications and her MDI technique, which was adequate, which approached 98%  at the end of training. Follow up could be every three months, sooner if  needed.  Charlaine Dalton. Sherene Sires, MD, [facility]  Electronically Signed    MBW/MedQ  DD: 12/01/2005  DT: 12/01/2005  Job #: [id number]

## 2010-05-29 NOTE — Assessment & Plan Note (Signed)
[facility]                              CARDIOLOGY OFFICE NOTE   [name], [name]                       MRN:          [id number]  DATE:10/22/2005                            DOB:          26-Feb-1950    [name] [name] returns today for careful followup of her atrial fibrillation.  She was initially placed on flecainide and had a DC cardioversion.  She had  a diffuse rash and was seen in the office on October 19, 2005.  She was  placed on some Benadryl, flecainide was discontinued, and she was placed on  Rythmol 325 mg p.o. b.i.d.  She is maintaining sinus rhythm.   Today, while in the office, she had some pressure and weakness.  She is  somewhat emotional.  Looking back at her chart, she had a cardiac  catheterization in 2003.  This showed normal coronary arteries.  She also  had a CT angio in September during her hospitalization to rule out pulmonary  embolus.  She has had a previous embolus, however.   MEDICATIONS:  1. Coumadin as directed.  2. Benicar HCT 40/25 a day.  3. Potassium 10 mEq a day.  4. Fluoxetine 20 mg a day.  5. Cardizem 240 mg a day.  6. Protonix 40 mg a day.  7. Xopenex hand-held nebulizer b.i.d.  8. Pulmicort 2 sprays q.12.  9. Rythmol 325 mg b.i.d.   VITAL SIGNS:  Blood pressure 120/80.  Pulse 58.  She is in sinus  bradycardia.  EKG is remarkable for a normal PR interval, normal QTC.  Her QRS is also  normal.  She does have some nonspecific ST segment changes that area  essentially at baseline.  GENERAL:  She is in no acute distress lying on the table.  SKIN:  Her skin is warm and dry.  NECK:  Carotid upstrokes are equal bilaterally without bruits.  No JVD.  Thyroid is not enlarged.  LUNGS:  Clear.  HEART:  Reveals a slow rate and rhythm that is regular.  There is no gallop.  There is no rub.  ABDOMINAL EXAM:  Soft with good bowel sounds.  There is no hepatomegaly or  organomegaly that can be appreciated.  EXTREMITIES:  Reveal minimal edema.  She has resolving rash on the left  posterior calf.  She had been scheduled for a stress test but had to cancel because of  conflicting appointments.   ASSESSMENT AND PLAN:  1. Probable non-cardiac chest pain.  2. Paroxysmal atrial fibrillation, now controlled on Rythmol and Cardizem.      She is relatively bradycardic and this could be making her tired.  3. Anticoagulation.  4. History of pulmonary embolus, currently stable with a negative CT      angio, September 2007.   PLAN:  1. Decrease Cardizem to 180 mg p.o. q.day.  2. Continue other medications as prescribed including the Rythmol 325 mg      b.i.d.  3. Adenosine Myoview.  If this is negative for ischemia, we will see her      back in three months.  I  would walk her, but I do not think she would      tolerate a treadmill.       Thomas C. Daleen Squibb, MD, [facility]     TCW/MedQ  DD:  10/22/2005  DT:  10/24/2005  Job #:  [id number]   cc:   Dr. Jonny Ruiz

## 2010-05-29 NOTE — H&P (Signed)
[place]  Patient:    [name] [name], [name] [name]                       MRN: [id number] Adm. Date:  [id number] Attending:  Ammie Dalton                         History and Physical  CHIEF COMPLAINT: This 60 year old obese white female is admitted through the emergency department with a chief complaint of "severe headache" of sudden onset about 11 a.m. today (June 24, 1999).  HISTORY OF PRESENT ILLNESS: The patient admits also to emesis, nausea, and diarrhea on and off for about one week prior to admission, although she denies emesis in the past three days.  In the ER the patients stay was complicated by temperature to 101.3 degrees.  Her INR is 3.1.  CAT scan of the brain was negative for subarachnoid hemorrhage or other abnormalities.  Chest x-ray was normal.  Urinalysis showed 10-20 wbc/hpf.  WBC was 12,200 with 80% segs.  The patient apparently is on Coumadin because of DVT, with history of pulmonary embolus about four years prior to admission, with Dr. Patty Sermons at that point attending.  The radiologist commented that this CAT scan showed signs of bilateral sphenoid sinus with right ethmoid sinusitis possibly accounting for the patients pain.  MRA of the cerebral vasculature was normal.  ADMISSION MEDICATIONS:  1. Coumadin.  2. Fluid pill.  3. Blood pressure pill.  PHYSICAL EXAMINATION:  VITAL SIGNS: Blood pressure 130/82, pulse 110, temperature 101.3 degrees maximum, respirations 22.  GENERAL: Obese white female in acute distress because of right frontal headache.  SKIN: Warm and dry.  Not diaphoretic.  Multiple benign skin lesions present.  HEENT: Head normocephalic, atraumatic.  PERRL.  EOMI.  No percussion tenderness.  NECK: Supple, no adenopathy or thyromegaly.  LUNGS: Clear to auscultation and percussion.  HEART: Tachycardia, otherwise normal.  ABDOMEN: Slightly protuberant.  Bowel sounds normal.  No tenderness.  EXTREMITIES:  Pulses full.  No edema.  IMPRESSION:  1. Low probability of subarachnoid bleed with negative CAT scan and MRA     negative, with absence of meningismus and only slight increase in WBC.     Doubt meningitis in the face of contraindicated lumbar puncture.  Pain     most likely secondary to ethmoid sinusitis on CAT scan and it is therefore     reasonable to observe on antibiotics at this point.  2. History of deep vein thrombosis and pulmonary embolus.  3. Essential hypertension.  4. History of asthma.  PLAN:  1. Complete cultures done in emergency room.  2. IV antibiotics.  3. Analgesics.  4. Observation. DD:  06/24/99 TD:  06/27/99 Job: [id number]

## 2010-05-29 NOTE — Assessment & Plan Note (Signed)
[facility]                            CARDIOLOGY OFFICE NOTE   [name] [name], [name] [name]                       MRN:          [id number]  DATE:03/28/2006                            DOB:          [birth year]    [name] [name] returns today per [name] Parrett's referral because of some  chest discomfort.   She saw [name] last week, at which time she complained of chest pain.  She had an EKG which was unchanged.   She has some chest discomfort this morning lying in bed.  It was between  her shoulder blades.  It was just a dull ache.  It did not radiate.  It  was not associated with any pleurisy or pleuritic pain.  She has had no  hemoptysis.  She has had no shortness of breath.   She does have a history of pulmonary emboli and is on Coumadin.  In  addition, she has a history of a negative stress Myoview on November 04, 2005.   She has had no recurrence of her atrial fibrillation.  She has done well  on Rythmol as well as diltiazem.   She denies any orthopnea, PND, or peripheral edema.  She has had no sign  of DVT in her legs.   MEDICATIONS:  1. Coumadin as directed.  2. Benicar/HCT 40/25 daily.  3. Potassium 10 mEq a day.  4. Fluoxetine 20 mg a day.  5. Rythmol 325 b.i.d.  6. Diltiazem CD 180 daily.  7. Symbicort 80/4.5, 2 puffs b.i.d.  8. Veramyst nasal spray.   PHYSICAL EXAMINATION:  VITAL SIGNS:  Her blood pressure today is 120/80,  pulse 84 and regular.  Her weight is 265. Respiratory rate is 18,  unlabored.  GENERAL:  She is in no acute distress.  HEENT:  Normocephalic, atraumatic.  PERRLA.  Extraocular movements  intact.  Sclerae clear.  Facial symmetry is normal.  NECK:  Carotids are full, without bruits.  There is no JVD.  Thyroid is  not enlarged.  Trachea is midline.  Neck is supple.  BACK:  There is no tenderness in her spine or between her shoulder  blades.  LUNGS:  Clear, without rub.  HEART:  Reveals a poorly appreciated PMI.  She has  normal S1, S2,  without gallop.  ABDOMEN:  Soft, with good bowel sounds.  EXTREMITIES:  Reveal superficial varicosities and some minor edema.  Pulses are intact.  There is no sign of DVT, and there is no tenderness  in her calf.  NEUROLOGIC:  Intact.   Electrocardiogram shows sinus rhythm, with a normal PR, QRS, and QTc  interval.   ASSESSMENT AND PLAN:  I think [name] [name] is doing well.  She is having  discomfort which I think is musculoskeletal.  I have reassured her  today. We have talked about the symptoms of her current pulmonary  emboli.  She will report these if this occurs.   Otherwise, I will see her back in 6 months.     Thomas C. Daleen Squibb, MD, [facility]  Electronically Signed    TCW/MedQ  DD: 03/28/2006  DT: 03/29/2006  Job #: [id number]   cc:   Rubye Oaks, NP

## 2010-05-29 NOTE — Discharge Summary (Signed)
[facility]  Patient:    [name] [name], [name] [name] Visit Number: [id number] MRN: [id number]          Service Type: MED Location: [phone] Attending Physician:  Corwin Levins Dictated by:   Claretta Fraise, M.D. Admit Date:  11/22/2000 Discharge Date: 11/26/2000   CC:         Corwin Levins, M.D. [facility]   Discharge Summary  DISCHARGE DIAGNOSES:  1. Exacerbation of asthma secondary to influenza.  2. History of deep venous thrombosis and pulmonary embolism on chronic     Coumadin.  3. History of hypertension.  4. History of osteoporosis.  5. History of depression.  6. History of osteoarthritis.  7. History of anxiety.  DISCHARGE MEDICATIONS:  1. Complete Tequin 400 mg p.o. q.d. x4 more days.  I just kept this on in     spite of the fact that her symptoms were more suggestive of a viral     influenza type of illness.  2. Protonix 40 mg p.o. q.d. while on prednisone.  3. Robitussin A-C 20 cc p.o. t.i.d. as needed for cough.  4. Tessalon Perles 200 mg one p.o. t.i.d. p.r.n. also.  5. She is to use albuterol inhaler or her nebulizer four times a day for the     next one to two days.  6. She is to go on a prednisone taper.  The patient will be given 30 mg p.o.     q.d. x1 day today prior to discharge and then she is to be on 2-1/2     tablets p.o. q.d. x1 day, and then two tablets p.o. q.d. x2 days, then     1-1/2 tablets x1 day, 1 tablet x1 day, then 1/2 x1 day and then     discontinue.  7. The patient is to continue all of her home medications including aspirin     81 mg p.o. q.d.  8. Lasix 20 mg p.o. q.d.  9. Coumadin, she is to stay on 10 mg p.o. q.d. 10. Klor-Con 10 mEq p.o. q.d. 11. Advair 500/50 one puff b.i.d. 12. Caltrate b.i.d. 13. Glucosamine 300 mg p.o. q.d. 14. Nasonex one spray b.i.d. in each nostril. 15. Allegra 180 mg p.o. q.d. 16. Hyzaar 50/12.5 p.o. q.d. 17. Actonel 5 mg p.o. q.d. 18. Celebrex 200 p.o. q.d. 19. Zoloft 100 p.o.  b.i.d. 20. Risperdal 1 mg 1/2 q.a.m. and whole tablet q.h.s. 21. Clorazepate 3.75 mg p.o. b.i.d. p.r.n. anxiety.  DISCHARGE INSTRUCTIONS:  Activity as tolerated.  I have given her a work excuse for November 18 and November 29, 2000, she is to return to work on November 30, 2000, and she can use this work excuse if she so chooses.  I leave the decision up to her.  Diet low-salt.  Follow-up with Dr. Jonny Ruiz in one to two weeks.  She is also to have a Coumadin check as previously scheduled this Tuesday, November 29, 2000.  HOSPITAL COURSE:  This 60 year old female patient with a history of asthma and bronchitis, whose primary care physician is Dr. Jonny Ruiz, was brought in and admitted for severe shortness of breath and wheezing.  She described some symptoms suggestive of the influenza virus as she developed the generalized achiness, URI type of symptoms and some fevers.  The patient says that her symptoms had started five days prior to admission and she had seen Dr. Revonda Standard on November 8 with productive cough, wheezing and increased dyspnea on exertion.  She was prescribed  Z-Pak, Medrol Dosepak, and Phenergan VC with Codeine cough syrup.  Since she had failed outpatient treatment, she was subsequently admitted.  During the hospital course, she had slow improvement initially in spite of IV Solu-Medrol.  She was given Solu-Medrol 120 in the ER and given some nebulizer treatments, and subsequently placed on 80 t.i.d. of Solu-Medrol.  Once cough suppression took place with some Robitussin A-C 20 cc p.o. t.i.d., she had marked improvement in her breathing status and was subsequently able to taper her off IV Solu-Medrol.  She tolerated being switched to p.o. prednisone yesterday and her respiratory status is markedly improved.  She has no wheezing on exam and is moving decent air throughout her lung fields.  Her cough is also under control with the Robitussin A-C and Tessalon Perles as needed.  She was  continued on Advair also during the stay and had albuterol nebulizer treatments four times a day and subsequently switched to AeroChamber, but I understand she has a nebulizer at home and she can decide whether she is going to do the inhaler with the AeroChamber or nebulizer, both of which has equal delivery of the albuterol.  In regards to her chronic anticoagulation, the patient was kept on her Coumadin and her Coumadin was adjusted per pharmacy.  On the day of discharge her PT and INR are 22.1 and 2.4.  Recommended that she stay on her Coumadin of 10 mg p.o. q.d.  She did receive on November 15 a dose of 12.5 mg.  She had some episodes of mild hyperglycemia and this was thought secondary to steroid CBGs, none of which required any insulin coverage.  With the tapering on the prednisone, her sugars really have been fairly reasonable.  On the date of discharge, her CBGs were running 126, 119, 160.  I do not think she will need any insulin coverage at all.  In fact, I suspect these to normalize. Hemoglobin A1C was normal at 4.5.  RADIOLOGY:  Her first initial chest x-ray on November 12 showed prominent interstitial markings which may represent an element of bronchitis or fibrotic change, no evidence of pneumonia.  Repeat chest x-ray was done on November 14 which showed mild stable interstitial prominence without any focal infiltrate.  LABORATORY DATA:  On admission she had a total white count of 7.8, hemoglobin 13.4, hematocrit 37.5, platelet count 235, and her differential was essentially normal.  Her CPK was 119.  Her BMP on November 14 showed sodium 121, potassium 4.3, chloride 104, CO2 30, glucose 183, BUN 19, and creatinine 1.  White count as mentioned earlier was essentially normal.  As mentioned, on the date of discharge her PT and INR are 22.1 and 2.4.  No cultures were obtained or needed.  DISCHARGE CONDITION:  Patient is being discharged to home in stable condition. She will  be going home with her husband.  Dictated by:   Claretta Fraise, M.D. Attending Physician:  Corwin Levins DD:  11/26/00 TD:  11/26/00 Job: [phone] [id number]

## 2010-05-29 NOTE — H&P (Signed)
NAME:  [name] [name], [name] [name] NO.:  [id number]   MEDICAL RECORD NO.:  [id number]          PATIENT TYPE:  EMS   LOCATION:  MAJO                         FACILITY:  [facility]   PHYSICIAN:  Thomas C. Wall, M.D.   DATE OF BIRTH:  07/07/1950   DATE OF ADMISSION:  11/27/2004  DATE OF DISCHARGE:                                HISTORY & PHYSICAL   PRIMARY CARDIOLOGIST:  Rollene Rotunda, M.D.   PRIMARY CARE PHYSICIAN:  Corwin Levins, M.D.   PULMONOLOGIST:  Charlaine Dalton. Sherene Sires, M.D.   PATIENT PROFILE:  A 60 year old white female with no prior history of CAD  who presented with left leg pain and chest pain.   PROBLEMS:  1.  History of left thigh pain.  2.  History of pulmonary embolism and deep vein thrombosis on the left side      in 1997, felt to be secondary to concomitant use of birth control pills      and tobacco.  3.  Asthma.  4.  Hypertension.  5.  Normal coronary arteries by cardiac catheterization in January 2003,      with an ejection fraction of 65% with normal wall motion.  6.  Impaired glucose tolerance.  7.  Anxiety and depression.  8.  Osteoporosis.  9.  Seasonal allergies.  10. Anemia.  11. Chronic bronchitis.  12. Hemorrhoids.  13. Status post hysterectomy in 1990s.  14. Fibrocystic breast disease with negative biopsy.   HISTORY OF PRESENT ILLNESS:  A 60 year old white female with a history of  hypertension, family history of CAD, and documented normal coronaries and  normal LV in January 2003.  She was in her usual state of health with  baseline dyspnea on exertion after walking approximately five feet for the  past one to two years until this a.m. when at work she developed 10/10 left  anterior lateral thigh burning and hurting with swelling sensation.  There  was no heat, redness, or cord by her assessment.  She presented to the [facility] ED and an ultrasound is currently pending.  While in the ED, she  developed 10/10 left breast heaviness without  associated symptoms radiating  to the left arm, treated with one sublingual nitroglycerin, resolving in 10  to 15 minutes.  She has had no chest pain since.  First set of cardiac  markers are negative.  ECG shows inferolateral ST segment depression which  is slightly more pronounced when compared to ECG in June 2005.   ALLERGIES:  PENICILLIN.   HOME MEDICATIONS:  1.  K-Dur 10 mEq daily.  2.  Paxil 20 mg daily.  3.  Clonazepam 1 mg daily.  4.  Albuterol two puffs q.i.d. and p.r.n.  5.  Spiriva 18 mcg two puffs daily.   FAMILY HISTORY:  Mother died of MI and hypertension at 58.  Father died of  cirrhosis and ETOH abuse at 15.  She had five brothers;  one died of cancer,  and there is hypertension in her brothers.   SOCIAL HISTORY:  She lives in [place], [place], with her husband.  She continues  to work in a [employer] that requires her to do a lot of squatting and  lifting.  She is married and has three children.  She smoked for 27 years,  one pack a day, and quit in 2005.  No alcohol or drugs.  She does not  routinely exercise.   REVIEW OF SYSTEMS:  Positive for chest pain, chronic dyspnea on exertion,  anxiety, depression, left thigh pain that is reproducible with palpation.  All other systems reviewed and negative.   PHYSICAL EXAMINATION:  VITAL SIGNS:  Temperature 97, heart rate 102,  respirations 16, blood pressure 108/68, pulse ox 93% on room air.  GENERAL:  A pleasant white female in no acute distress.  Awake, alert, and  oriented x3.  NECK:  Normal carotid upstrokes, no bruits or JVD.  LUNGS:  Respirations are unlabored.  CARDIAC:  Regular S1 and S2, no S3, S4, murmurs.  ABDOMEN:  Obese, soft, nontender, nondistended, bowel sounds present x4.  EXTREMITIES:  Warm, dry, pink.  No cyanosis, clubbing, or edema.  Dorsalis  pedis and posterior tibial pulses are 2+ and equal bilaterally.  There is a  negative Homan's sign on the left.  There is no heat, erythema, or palpable  cord  on the left leg.  There is reproducible left thigh pain with palpation.   LABORATORY DATA:  A chest x-ray is not applicable.  Chest CT scan to rule  out PE is pending.  EKG shows a heart rate of 93 in sinus rhythm, with 1 mm  ST segment depression in 1, 2, 3, AVF, and V3 through V6, slightly more  pronounced when compared to EKG in June 2005.  She has 1 to 2 mm Q-waves in  2, 3, and AVF.   Hemoglobin 12.3, hematocrit 35.3, WBC 6.9, platelets 247.  Sodium 138,  potassium 3.4, chloride 105, CO2 of 30.4, BUN 19, creatinine 1, glucose 85.  CK 195, MB 4.5, troponin-I 0.02.   ASSESSMENT AND PLAN:  1.  Chest pain.  She has had one episode of left chest breast heaviness      resolved with one sublingual nitroglycerin.  She is now pain-free.      Electrocardiogram with slightly more pronounced ST changes compared with      electrocardiogram in June 2005.  We will plan to admit and rule out.  If      cardiac markers are negative, plan for discharge in the a.m. with      outpatient Myoview followup (provided deep vein thrombosis/pulmonary      embolism workup is negative).  I will start her on heparin and aspirin.      Chest CT scan is pending to rule out pulmonary embolism.  2.  Hypertension.  Stable.  3.  Left leg pain.  Ultrasound is pending.  Given her work history of a lot      of squatting and standing, as well as reproduction of pain with      palpation, question muscle strain.  4.  Asthma/chronic obstructive pulmonary disease.  Continue Spiriva and      albuterol.  She is not currently wheezing.  5.  Anxiety and depression.  Continue fluoxetine and clonazepam.      Ok Anis, NP      Jesse Sans. Wall, M.D.  Electronically Signed    CRB/MEDQ  D:  11/27/2004  T:  11/27/2004  Job:  [id number]

## 2010-05-29 NOTE — Discharge Summary (Signed)
NAME:  [name] [name], [name] [name]                          ACCOUNT NO.:  [id number]   MEDICAL RECORD NO.:  [id number]                   PATIENT TYPE:  INP   LOCATION:  3016                                 FACILITY:  [facility]   PHYSICIAN:  Titus Dubin. Alwyn Ren, M.D. [facility]         DATE OF BIRTH:  Mar 26, 1950   DATE OF ADMISSION:  08/25/2001  DATE OF DISCHARGE:  08/26/2001                                 DISCHARGE SUMMARY   HISTORY OF PRESENT ILLNESS:  The patient is a 60 year old white female  admitted with an acute exacerbation of asthmatic bronchitis.  For full  details, please see Dr. Vernon Prey note.   She was treated aggressively with pulmonary toilet and received parenteral  steroids and antibiotics.   Within 24 hours she was afebrile and had oxygen saturations of 97% on room  air.  By the time of discharge her chest was clear.  Her pulse was 67 and  regular, respiratory rate 20 and unlabored.   Her asthmatic bronchitis was improved, and she was discharged on an  abbreviated course of prednisone 20 mg twice a day for five days, then 20 mg  daily for five days with meals.  She was also discharged on a Zithromax Z-  Pak.   There was not felt to be any acute pneumonic process, and CT revealed no  evidence of deep venous thrombosis and no pulmonary thromboembolia.   Her potassium was mildly reduced at 3.4.  It was recommended that she ingest  her prednisone with orange juice and have a banana daily.  She is to season  her food with no salt.   A random glucose is 139.  She is not a diabetic, by her history.  It was  recommended that she restrict concentrated sweets.  Evaluation of hemoglobin  A1C will be referred to Dr. Oliver Barre.   Her hematocrit was 32.  She has no active symptoms related to anemia, and  this also will be evaluated as an outpatient as she is stable at this time.   Diet will be as noted.  Activity will be as tolerated.   She has Advair 500/50 at home which she will use  every 12 hours.  She has  albuterol, both as a metered dose inhaler as well as nebulized form which  she can use every four hours as needed.   She was hospitalized and therefore missed work August 15 and August 16.  She  will return to work on August 31, 2001.   Prognosis at this time appears to be good.                                               Titus Dubin. Alwyn Ren, M.D. [facility]   WFH/MEDQ  D:  08/26/2001  T:  08/29/2001  Job:  [id number]   cc:   Corwin Levins, M.D. [facility]

## 2010-05-29 NOTE — Assessment & Plan Note (Signed)
[facility]                                 ON-CALL NOTE   TRINITY, HAUN                       MRN:          [id number]  DATE:03/28/2006                            DOB:          Feb 12, 1950    PHONE NUMBER:  [phone].   Patient of Dr. Jonny Ruiz.   Phone call at 7:23 p.m. on March 28, 2006.   Ms. Bisaillon called.  She is crying on the phone.  Says she is having a  lot of stress.  She has been on Prozac for some time for her nerves from  Dr. Jonny Ruiz.  Then, at the last appointment they talked about raising it,  but she did not think she needed it at that time.  She just saw the  cardiologist today, and he told her that everything was okay, but she is  still feeling a lot of anxiety.  She was crying on the phone and really  seemed to have trouble keeping it together.  I suggested she may want to  find someone to bring her to the emergency room tonight.  I told her  that we did not phone in tranquilizers after hours.  She understood this  and said that she would wait until the morning, but I did make it clear  that the emergency room would be appropriate if she was not doing well  tonight.     Karie Schwalbe, MD  Electronically Signed    RIL/MedQ  DD: 03/28/2006  DT: 03/29/2006  Job #: [id number]   cc:   Corwin Levins, MD

## 2010-05-29 NOTE — H&P (Signed)
NAME:  [name] [name], [name] [name] NO.:  [id number]   MEDICAL RECORD NO.:  [id number]                   PATIENT TYPE:  EMS   LOCATION:  MAJO                                 FACILITY:  [facility]   PHYSICIAN:  Gordy Savers, M.D. [facility]      DATE OF BIRTH:  [birth year]   DATE OF ADMISSION:  08/25/2001  DATE OF DISCHARGE:                                HISTORY & PHYSICAL   CHIEF COMPLAINT:  Shortness of breath and cough.   HISTORY OF PRESENT ILLNESS:  The patient is a 60 year old white female with  a history of COPD, chronic asthma, and ongoing tobacco use.  She was stable  until about two to three weeks ago when she noted increasing cough.  On the  day of admission had considerable increased wheezing through the night and  presented to the emergency department complaining of increasing shortness of  breath with wheezing.  The patient was treated in the emergency department  with nebulizer treatments, steroids, and failed to improve.  She remained  dyspneic at rest in spite of oxygen therapy and aggressive treatment.  She  was then admitted for further evaluation and treatment of decompensated  chronic asthmatic bronchitis.   PAST MEDICAL HISTORY:  The patient has a long history of COPD and asthma of  four to five years' duration.  She continues to smoke one-half pack of  cigarettes daily.  She was hospitalized in 1995 for a DVT complicated by  pulmonary embolism.  In the emergency department, CT scan was negative for  acute pulmonary embolism.  She has a history of DJD, osteoporosis, and also  a history of anxiety and depression.  She had a remote hysterectomy.   ALLERGIES:  Penicillin.   MEDICAL REGIMEN:  Celebrex, Advair 500/50 one puff b.i.d., Nasonex, Allegra,  Zoloft 100 mg daily.  She states she is on two unknown blood pressure  medications.  Medications prescribed in the past according to her medical  records included Tranxene 3.75 mg b.i.d., Risperdal 1  mg daily, Actonel 5 mg  daily, Caltrate.  She also uses Albuterol meter dose inhaler p.r.n.   SOCIAL HISTORY:  She is married, three children, smokes one-half to one pack  per day.   FAMILY HISTORY:  Mother died of an MI, father died of unclear causes.  One  brother has a history of throat cancer.   REVIEW OF SYSTEMS:  Otherwise really noncontributory.  The patient was  hospitalized in the spring for decompensated COPD.  Also had a heart  catheterization in January of this year which was normal.   EXAMINATION:  GENERAL:  Revealed a well-developed, obese, white female,  nasal O2 in place.  She had some tachypnea at rest.  SKIN:  Warm and dry without rash.  Did have some scattered tattoos over her  right arm.  HEENT:  Revealed mild conjunctival hyperemia.  ENT negative.  Oropharynx was  clear.  Several scattered dentition missing.  NECK:  No neck vein distention, bruits, or thyroid enlargement.  CHEST:  Revealed some faint wheezing and a few rhonchi.  CARDIOVASCULAR:  S1 and S2 normal.  No murmurs or gallops.  ABDOMEN:  Obese, soft, and nontender.  No organomegaly.  EXTREMITIES:  Revealed intact peripheral pulses, no edema.  NEURO:  Nonfocal.   IMPRESSION:  1. Decompensated asthmatic bronchitis.  2. Hypertension.  3. Ongoing tobacco use.  4. Remote history of pulmonary embolism.  5. Degenerative joint disease.  6. History of osteoporosis.  7. History of anxiety and depression.  8. Penicillin allergy.   DISPOSITION:  The patient will be admitted to the hospital, will continue to  receive aggressive pulmonary toilet with nebulizer treatments, will place on  Zithromax daily, will treat with parenteral steroids short-term and also  treat with antitussives.                                               Gordy Savers, M.D. [facility]    PFK/MEDQ  D:  08/25/2001  T:  08/28/2001  Job:  [id number]

## 2010-05-29 NOTE — Discharge Summary (Signed)
[facility]  Patient:    [name] [name], [name] [name]                       MRN: [id number] Adm. Date:  [id number] Disc. Date: [id number] Attending:  Rudean Hitt CC:         Ammie Dalton, M.D.                           Discharge Summary  HISTORY:  This is a 60 year old woman admitted as an emergency by Dr. Amil Amen on March 02, 1999 because of chest pain.  She had had a history of negative cardiac catheterization in 1993.  In 1997, she developed pulmonary emboli and deep vein thrombosis felt secondary to the combination of birth control pills and tobacco abuse.  She quit smoking at that point.  Since then, she has had intermittent episodes of chest pain.  The episode that brought her to the emergency room this time was of brief duration, it resolved, then came again. Described as a heaviness at about 5 p.m.  There was no radiation, no nausea or vomiting or diaphoresis.  There does not appear to be any response to nitroglycerin or morphine and it did not seem to be worse with movement. There was no exertional component.  Physical examination was unremarkable. Patient was admitted for observation overnight.  Her electrocardiogram showed no acute ST, T-wave abnormality.  Her enzymes came back negative.  IV nitroglycerin was continued overnight and then stopped the next morning.  She was able to be discharged later that next day to undergo an outpatient adenosine Cardiolite in the near future.  She had been noticing some increased swelling and Lasix was resumed.  Patient was able to be discharged improved on Coumadin 10 mg one tablet daily, except a half tablet on Wednesday and Sunday.  Zestoretic one daily, Lasix one daily, coated 81 mg aspirin daily and Nitrostat 1/150th p.r.n.  She is to walk as tolerated.  She is not to work until Monday, February 25.  She should be on a low-fat, low cholesterol diet.  She is to call my office to scheduled  an adenosine Cardiolite stress test. DD:  03/26/99 TD:  03/26/99 Job: [id number]

## 2010-05-29 NOTE — Discharge Summary (Signed)
[place]  Patient:    [name] [name], [name] [name]                       MRN: [id number] Adm. Date:  [id number] Disc. Date: [id number] Attending:  Ammie Dalton                           Discharge Summary  FINAL DIAGNOSES: 1. Severe headache, etiology unknown. 2. Essential hypertension. 3. History of Coumadin-dependent deep venous thrombosis.  HISTORY OF ILLNESS:  This 60 year old white female, patient of Dr. Ammie Dalton, was admitted through the emergency department because of severe sudden-onset headache.  Patient admits to prodromal emesis, nausea and diarrhea on and off for about one week prior to admission.  In the emergency department, the patients temperature was 101, WBC 12.2, chest x-ray normal and CT of the brain was normal for subarachnoid hemorrhage.  Patient has been on longstanding Coumadin because of DVT with associated pulmonary embolus about four years prior to admission.  Unfortunately, a lumbar puncture was unable to be obtained in the emergency department because of the patients pro time which showed an INR of 3.1.  HOSPITAL COURSE:  Patient was admitted with the possibility that the patient may have meningitis, considering the above clinical findings.  CAT scan on review with radiology showed an ethmoid and sphenoid sinusitis that was possibly contributory, although the patient was seen through the courtesy of Dr. Jeannett Senior. Pollyann Kennedy of ENT who felt that the headache, although severe, could probably not be accounted for by the patients CT findings of sinusitis.  The patient was placed on Rocephin 2 g IV every 24 hours to cover the possibility of meningitis which could not be documented because of the inability to do a lumbar puncture.  Patient was also seen through the courtesy of Dr. Santina Evans A. [facility] of neurology who felt that that although it was unlikely that meningitis was present, it could not be entirely ruled  out, considering the inability to do diagnostic lumbar puncture.  Consultations were also informally made to Dr. Dewayne Shorter of infectious disease, who felt that the most reasonable way to resolve the quandary would be to empirically treat the patient for 10 days with Rocephin to cover at least the possibility of meningitis and that even if a lumbar puncture were to be performed, it probably would not yield diagnostic information at this point. In any case, the decision was to treat the patient for a total of 10 days of IV Rocephin to cover that possibility.  The patients headache completely resolved after three days of hospital stay but unfortunately, the patient did have a recurrence of her diarrhea and C. difficile assay was pending at the time of dictation and the patient was empirically placed on Flagyl p.o. to cover the possibility of antibiotic-associated diarrhea.  Patient has reached maximal hospital benefit at the time of dictation, with C. difficile pending, the patient having only minimal diarrhea and no headache.  Patient was instructed at the time of dictation to resume her Coumadin, which was held because of the impending possibility of doing either a surgical procedure or lumbar puncture, and home health nurse was consulted in order to provide the remaining doses of Rocephin at home.  LABORATORY AND IMAGING DATA:  CT of the brain:  Normal.  MR angiogram of the cerebral blood vessels completely normal.  CT of the sinuses showing ethmoid and sphenoid  sinusitis.  Blood cultures and urine culture either sterile or pending.  WBC 12.2 with hemoglobin 12.5.  INR 3.1 on admission.  DISCHARGE MEDICATIONS: 1. Rocephin 2 g IV daily, home health nurse Monday through Sunday for a    total of six doses. 2. Flagyl 250 mg p.o. q.i.d. 3. Lomotil 2.5 mg up to six hours p.r.n. diarrhea.  DIET:  Resume regular diet.  FOLLOWUP:  Follow up with Dr. Theda Belfast Tuesday.  CONDITION ON  DISCHARGE:  Improved. DD:  06/28/99 TD:  06/30/99 Job: [id number]

## 2010-05-29 NOTE — Discharge Summary (Signed)
NAMEANINA, SCHNAKE                ACCOUNT NO.:  [id number]   MEDICAL RECORD NO.:  [id number]          PATIENT TYPE:  INP   LOCATION:  4731                         FACILITY:  [facility]   PHYSICIAN:  Learta Codding, MD,FACC DATE OF BIRTH:  31-May-1950   DATE OF ADMISSION:  10/13/2005  DATE OF DISCHARGE:  10/16/2005                               DISCHARGE SUMMARY   PROCEDURES:  Direct current cardioversion.   PRIMARY DIAGNOSIS:  1. Atrial fibrillation with rapid ventricular response.   SECONDARY DIAGNOSES:  1. Dyslipidemia.  2. Hypertension.  3. Remote history of tobacco use.  4. Family history of MI in her mother.  5. COPD.  6. Morbid obesity.  7. History of multiple pulmonary emboli.  8. Chronic anticoagulation with Coumadin.   HOSPITAL COURSE:  Mrs. Dillehay is a 60 year old female with a history of  atrial fibrillation followed by Dr. Daleen Squibb.  The day prior to admission,  she developed recurrent palpitations and dyspnea.  When she came to the  hospital, she was in atrial fibrillation with rapid ventricular  response.  She was admitted for further evaluation and treatment.   She had undergone TEE guided cardioversion in September 2007, but then  she developed recurrent atrial fibrillation, Flecainide at 100 mg p.o.  b.i.d..  Cardizem had initially been used for rate control and this was  changed to long-acting Cardizem at 240 mg a day.  She was also on Coreg  and was to continue this.   Mrs. Sepulveda was set up for cardioversion October 15, 2005, which was  successful on the second shock.  Initial shock was 120 joules and then  with 200 joules, she was successfully converted to sinus rhythm.   Her Coumadin was therapeutic throughout her hospital stay.  On October 16, 2005, Mrs. Allbritton was evaluated by Dr. Dr. Andee Lineman.  She was  maintaining sinus rhythm and tolerating the flecainide, so she was  discharged on October 16, 2005 with outpatient follow-up arranged.   DISCHARGE  INSTRUCTIONS:  1. Activity level is to be increased gradually.  2. She is to follow up with Dr. Daleen Squibb and our office will call with an      appointment.  3. She is to stick to a low-fat diet.  4. She is to follow up with her primary care physician as needed or as      scheduled.   DISCHARGE MEDICATIONS:  1. Coumadin as directed.  2. Cardizem CD 240 mg daily.  3. Pepcid 20 mg 1-2 tablets a day or Protonix 40 mg a day.  4. Benicar 40 mg daily and HCTZ 25 mg daily instead of Benicar/HCTZ.  5. Flecainide 100 mg daily.  6. Klor-Con 10 mEq a day.  7. Fluoxetine 20 mg a day.  8. Xopenex b.i.d.  9. Carvedilol 3.125 mg b.i.d.  10.Pulmicort 2+ q.8 h.      Theodore Demark, PA-C      Learta Codding, MD,FACC  Electronically Signed    RB/MEDQ  D:  12/27/2005  T:  12/27/2005  Job:  [id number]   cc:   Charlaine Dalton. Wert,  MD, FCCP  Corwin Levins, MD

## 2010-05-29 NOTE — Consult Note (Signed)
[place]  Patient:    [name] [name], [name] [name]                         MRN: [id number] Proc. Date: 06/25/99 Attending:  Jeannett Senior. Pollyann Kennedy, M.D. CC:         Ammie Dalton, M.D.                          Consultation Report  REFERRING PHYSICIAN:  Ammie Dalton, M.D.  CONSULTING PHYSICIAN:  Jefry H. Pollyann Kennedy, M.D.  REASON FOR CONSULTATION:  Evaluate sinusitis.  HISTORY:  This is a 60 year old lady who is admitted on June 24, 1999 with severe headaches and for workup of possible intracranial abnormality. She developed a severe headache about two days prior to admission and was unable to find relief. She was admitted to the hospital and underwent a CT scan of the head to rule out intracranial bleed which was negative.  There was some incidental finding of ethmoid and sphenoid sinusitis that was revealed. There was a CT scan done in January that she does not recall the reason she had that CT scan but it was also of the head and did not reveal any evidence of sinus disease at that time.  She has been having some problems in the past two to three months of open "feeling sick" with the various types of complaints including upper respiratory complaints.  She has chronic nasal discharge.  She was started on Rocephin within the past 24 hours or so for what I understand to be for the sinusitis and possibly for meningitis.  She was not able to have a lumbar puncture because she is on Coumadin.  PAST MEDICAL HISTORY: She has a past medical history of pulmonary embolus, deep vein thrombosis, hypertension, and hysterectomy.  FAMILY HISTORY:  There is no family history of migraines.  MEDICATIONS:  Are listed on the chart.  SOCIAL HISTORY:  She quit smoking about 12 years ago.  PHYSICAL EXAMINATION:  She is a ill appearing lady, also somewhat sedated from narcotic analgesia that she had taken shorten before I came to evaluate her. She is in no distress, although  does have some discomfort from the headache. Her breathing is clear and unlabored. Cervical exam reveals no evidence of neck mass.  The oral cavity and pharynx are free of any mucosal abnormalities or masses.  She does have some dental disease in the maxillary dentition in particular.  There is a left upper premolar on the left side and a right upper molar that are both severely diseased and on percussion with a tongue depressor caused her severe pain and actually worsened her headache. Intranasal exam is clear except for a deviated septum towards the left side. Ears are clear to inspection.  She has a history of hearing loss in her left ear but is not able to further elucidate on that.  I reviewed the CT scan of the head from this hospitalization and from her past examination.  There is significant deviation of the septum towards the left side.  There is scattered ethmoid opacification on the right side.  There is bilateral sphenoid mucosal thickening and possibly some fluid as well but the sinuses appear to be open and partially aerated without obstruction.  The maxillary sinuses are not well visualized but the most superior cut, they do appear clear.  There is no frontal sinus disease.  IMPRESSION:  Severe headache.  I do not believe that the degree of sinus disease that is seen on the CT scan is sufficient to explain headache of this severity.  Intracranial problem has been ruled out, although the possibility of meningitis still exists.  I suspect she will need to be treated with a full course of Rocephin to make sure she does not have meningitis since it is probably not possible to perform lumbar puncture.  I am very suspicious about her headaches being related at least in part to the dental disease. I would recommend she see her dentist as soon as possible or if she will be in the hospital for any length of time perhaps see the hospital dental physician.  I suspect she will need  those two teeth extracted and it is possible that this will relieve some of her headache.  I have nothing further to add to her care at this point. I gave her my office number to contact me if she has additional problems with her sinuses or with her hearing loss.  Rocephin would be adequate coverage for this sinusitis if she will be on it for any length of time.  If needs follow up, imaging or examinations I would be happy to provide that as an outpatient. DD:  06/26/99 TD:  06/26/99 Job: [id number]

## 2010-05-29 NOTE — Assessment & Plan Note (Signed)
[facility]                               PULMONARY OFFICE NOTE   [name] [name], [name] [name]                       MRN:          [id number]  DATE:10/28/2005                            DOB:          27-Aug-1950    PULMONARY/EXTENDED POST-HOSPITAL FOLLOW-UP VISIT:   HISTORY:  This is a 60 year old white female with morbid obesity, who quit  smoking five years ago with a baseline FEV1 of 25% predicted, who has been  in and out of the hospital several times with rapid atrial fibrillation  requiring cardioversion.  Is now maintained on Coumadin and states she is  having trouble walking more than approximately 100 feet due to dyspnea,  which improved swelling with Xopenex but not as well as it did with  albuterol, but can't tolerate albuterol because of palpitations.   She comes in today on a complex medical regimen that she listed on a sheet  of paper that does not correlate, actually, to what she is taking at home.  She states that she does sleep well at night on two pillows with no  orthopnea, early morning wheeze, nocturnal cough, fever, chills, sweats,  orthopnea, or leg swelling.   Past medical history is significant for morbid obesity and complicated in  turn by pulmonary embolism, for which she is on lifelong Coumadin.  She is  also status post hysterectomy and has a history of hypertension and  depression.   MEDICATIONS:  Taken in detail, as noted, but I am not confident the list is  correct.  She said she is taking Pulmicort, but when I asked her to describe  it, she could not, for instance, and did not bring any of the medicines with  her.   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  She does not think she can continue this.   FAMILY HISTORY:  Negative for respiratory diseases.   REVIEW OF SYSTEMS:  Taken in detail and significant for the absence of  cough, pleuritic or exertional chest pain,  difficulty swallowing, overt  reflux symptoms.   PHYSICAL EXAMINATION:  GENERAL:  She is a depression-appearing, ambulatory  obese white female in no acute distress.  VITAL SIGNS:  She is afebrile with normal vital signs with a weight of 247  pounds.  HEENT:  Unremarkable.  Her oropharynx is clear.  LUNGS:  Diminished breath sounds bilaterally with no wheezing.  HEART:  Regular rhythm without murmur, rub or gallop.  S1 and S2 were  diminished.  ABDOMEN:  Obese but otherwise benign with normal excursion in the supine  position.  EXTREMITIES:  Warm without calf tenderness, clubbing, cyanosis or edema.   Chart review indicates a CT scan of the chest was done on September 15, 2005  and is normal.   Echocardiogram was performed on September 5th and reveals mild left atrial  dilatation, normal RV, and normal LV function but mild aortic stenosis.   PFTs were performed today and reveal an FEV1 now of only 39% predicted.  We  walked this  patient for one lap, and she desaturated after 185 to 83%,  associated with dyspnea.   IMPRESSION:  1. Very poor exercise tolerance associated with hypoxemia, which is      multifactorial.  I suspect she has significant underlying chronic      obstructive pulmonary disease  and suspect that she also has an      asthmatic component, based on the variability of her dyspnea and the      improvement after Xopenex.  I do not understand why she feels better      from albuterol than Xopenex but did review with her optimal MDI      technique and found that it was so poor, it was unlikely either one was      actually reaching the lung.  In fact, more of it is probably absorbed      systemically than reaching the lung by her present MDI technique.      Therefore, I spent extra time by teaching her optimal MDI technique and      introducing her to Symbicort 80/4.5 2 puffs b.i.d.  As she uses      perfectly Reglan use.  Xopenex 1-2 puffs every 4 hours p.r.n.  2. I do  not believe she could continue with her job of custodial work and      advised her to seek disability.  I believe she could only do desk work      at this point.  I also am concerned this patient is struggling with      medication self-administration and offered the services of a nurse      practitioner for a full medication reconciliation within a week.   Followup here will be in six weeks with PFTs.  If she does not do well on  Symbicort, one option might be to use __________ and Pulmicort in  combination per nebulizer; however, until we can assure full medication  reconciliation, no further adjustment will be made in her medicines here.            ______________________________  Charlaine Dalton. Sherene Sires, MD, [facility]      MBW/MedQ  DD:  10/28/2005  DT:  10/29/2005  Job #:  [id number]

## 2010-05-29 NOTE — Assessment & Plan Note (Signed)
[facility]                                   ON-CALL NOTE   [name] [name], [name] [name]                       MRN:          [id number]  DATE:10/12/2005                            DOB:          1950-01-12    Primary care is Dr. Valera Castle.   Apparently [name] [name] was placed on an event monitor today, and was  immediately noticed to be in atrial fibrillation, with rates in the 130s, as  well as rates as high as the mid 160s for seconds at a time.  The service  called me this evening and mentioned that she was in a-fib.  I do not have  access to her records currently, and thus I do not know the indication for  the monitoring at this time.  I did call [name] and leave a message on  her answering machine indicating that I have received calls from the service  regarding her heart rate and rhythm, and that if she received the message  tonight she should call back the answering service so that we can be in  touch with her, otherwise to call the office in the morning.  The [employer]  service has also faxed the rhythm strips to the office for review tomorrow.            ______________________________  Nicolasa Ducking, ANP      CB/MedQ  DD:  10/12/2005  DT:  10/14/2005  Job #:  [id number]

## 2010-05-29 NOTE — Assessment & Plan Note (Signed)
[place] [facility]                           ELECTROPHYSIOLOGY OFFICE NOTE   [name] [name], [name] [name]                       MRN:          [id number]  DATE:11/03/2005                            DOB:          21-Feb-1950    [name] [name] returns today for follow up. She is a very pleasant, obese,  middle-aged woman with a history of paroxysmal atrial fibrillation,  hypertension, and COPD. The patient is on multiple bronchodilators. She was  initially tried on flecainide and developed a rash, and so she was switched  to Rythmol 325 twice a day in conjunction with antihypertensive medications  and Coumadin. Mostly, she has been in sinus rhythm since she saw Dr. Daleen Squibb  two weeks ago on Rythmol. The patient has had no syncope or dizziness. She  denies peripheral edema.   On exam, she is a pleasant, obese, middle-aged woman in no distress. Blood  pressure was 128/74. The pulse was 80 and regular. The respirations were 18.  The weight was 245 pounds.  NECK:  Revealed no jugular venous distention.  LUNGS:  Clear bilaterally except for scattered rales and rhonchi. There was  no increased work of breathing.  CARDIOVASCULAR EXAM:  Revealed a regular rate and rhythm with normal S1 and  S2. There are no murmurs, rubs, or gallops. The heart sounds are distant.  EXTREMITIES:  Demonstrate no clubbing, cyanosis, or edema.   IMPRESSION:  1. Paroxysmal atrial fibrillation.  2. Obesity.  3. Severe chronic obstructive pulmonary disease.  4. Chronic Coumadin therapy.  5. History of pulmonary emboli.   DISCUSSION:  I have recommended that we try to undergo exercise stress  testing on [name] [name] as outlined by Dr. Daleen Squibb rather than utilization of  adenosine. I will plan to see her in 6 months. We will plan to continue  Rythmol for now.            ______________________________  Doylene Canning. Ladona Ridgel, MD     GWT/MedQ  DD:  11/03/2005  DT:  11/04/2005  Job #:  [id number]

## 2010-05-29 NOTE — Assessment & Plan Note (Signed)
 [facility]ALTHCARE                             PULMONARY OFFICE NOTE   ELLANIE, OPPEDISANO                         MRN:          [id number]  DATE:02/04/2006                            DOB:          14-Oct-1950    HISTORY OF PRESENT ILLNESS:  This is a 60 year old white female patient  of Dr. Thurston Hole with a known history of COPD and morbid obesity presents  for a 2-week followup.  Last visit, the patient had had increased nasal  congestion, sinus pain and pressure, and cough.  The patient was given a  5-day course of Levaquin and a prednisone Dosepak.  The patient returns  today and reports that she is improved with decreased nasal congestion  and resolution of colored sputum.  However, she continues to have  postnasal drip, throat clearing, and watery itching eyes.  The patient  denies any hemoptysis, orthopnea, PND, or leg swelling.   PAST MEDICAL HISTORY:  Reviewed.   CURRENT MEDICATIONS:  Reviewed.   PHYSICAL EXAMINATION:  GENERAL:  The patient is a morbidly-obese female  in no acute distress.  She is afebrile with stable vital signs.  O2  saturation is 92% on room air.  HEENT:  Nasal mucosa is pale.  Nontender sinuses.  TMs normal.  NECK:  Supple without adenopathy.  No JVD.  LUNG SOUNDS:  Clear bilaterally.  CARDIAC:  Regular rate.  ABDOMEN:  Morbidly obese and soft.  EXTREMITIES:  Warm without any edema.   IMPRESSION AND PLAN:  1. Recent sinusitis, improved with antibiotics and steroids.  However,      the patient continues to have some mild rhinitis symptoms.  I have      recommended that she begin on Veramyst nasal spray two puffs twice      daily and add in saline nasal spray p.r.n.  The patient will      recheck here as scheduled in 3-4 weeks with Dr. Sherene Sires or sooner if      needed.  2. Morbid obesity.  The patient's last thyroid test in September was      normal.  We will refer over to the nutritional center to help with      dietary  measures.  3. Complex medication regimen.  The patient's medications were      reviewed in detail.  Patient education provided.  The patient's      medications brought in today are correct with her medication list.      The patient's computerized medication calendar was adjusted      accordingly      and reviewed with the patient.  The patient is aware to bring this      back to each and every visit.      Rubye Oaks, NP  Electronically Signed      Charlaine Dalton. Sherene Sires, MD, [facility]  Electronically Signed   TP/MedQ  DD: 02/04/2006  DT: 02/04/2006  Job #: [id number]

## 2010-05-29 NOTE — Discharge Summary (Signed)
[name], [name] NO.:  [id number]   MEDICAL RECORD NO.:  [id number]          PATIENT TYPE:  INP   LOCATION:  3027                         FACILITY:  [facility]   PHYSICIAN:  Jesse Sans. Wall, M.D.   DATE OF BIRTH:  August 03, 1950   DATE OF ADMISSION:  11/27/2004  DATE OF DISCHARGE:  12/05/2004                                 DISCHARGE SUMMARY   PRIMARY DIAGNOSIS:  Pulmonary embolus.   SECONDARY DIAGNOSES:  1.  History of pulmonary embolus and deep vein thrombosis on the left in      1997 (felt secondary to birth control pills and tobacco).  2.  Asthma.  3.  Hypertension.  4.  Normal coronary arteries and an ejection fraction of 65% by      catheterization in 2003.  5.  Impaired glucose tolerance.  6.  Anxiety/depression.  7.  Osteoporosis.  8.  Seasonal allergies.  9.  Anemia.  10. Chronic bronchitis.  11. Hemorrhoids.  12. Status post hysterectomy and fibrocystic breast disease.   ALLERGIES:  PENICILLIN.   PROCEDURES:  1.  Lower extremity Doppler showing no evidence of DVT, SVT or Baker's cyst.      Enlargement of inguinal lymph node noted.  2.  CT angiogram of the chest showing abnormal filling defects within the      right main pulmonary artery and its branches, consistent with acute      pulmonary emboli.   Primary care physician is Corwin Levins, M.D.  Pulmonary is Casimiro Needle B. Sherene Sires,  M.D.  Primary cardiologist Rollene Rotunda, M.D.   HOSPITAL COURSE:  [name] [name] is a 60 year old female with no known history  of coronary artery disease.  She has chronic dyspnea on exertion and on the  day of admission developed 10/10 left anterior thigh pain.  She came to the  emergency room for the left thigh pain but then developed chest heaviness.  EKG was mildly abnormal and she was admitted for further evaluation and  treatment.  She was started on heparin and aspirin.   A chest CT was performed, and it was positive for PE.  She was then started  on Coumadin.   Labs were performed to rule out a hypercoagulable state, and a  TSH was performed as well.  The TSH was within normal limits at 3.183, and a  homocysteine level was mildly elevated at 19.0.  Factor V Leiden mutation  was negative.  Lupus anticoagulant was elevated at 91.3 with PTT/LA  confirmation within normal limits at 4.9 and PTT/LA 4:1 mix elevated at  77.9.  Lupus anticoagulation was not detected.  Anticardiolipin antibody IgG  was within normal limits.  Protein C functional test was mildly abnormal at  153 with a protein C level of 78.  Protein S was within normal limits at 90.   Ms. Thorstenson was anticoagulated with Coumadin and her INR increased slowly.  Initially no dose response was noted at 7.5 mg for two doses, and this was  increased to 12.5 and then 15 mg.  Once her INR started climbing, she was  dropped  back to 7.5 mg and is to stay on this dose.  She is to follow up at  the [facility] Coumadin clinic on November 27 at 2:45.   Lower extremity Dopplers were performed, which showed no evidence of DVT,  SVT or Baker's cyst.  Her thigh pain resolved.  Her chest pain resolved as  well, and cardiac enzymes were negative.  It was felt that no further  ischemic evaluation was indicated.   By December 05, 2004, she had 24 hours' overlap with a therapeutic INR, and  it was considered acceptable to discontinue the Coumadin.  She was evaluated  by Dr. Daleen Squibb and considered stable for discharge with outpatient follow-up  arranged.   DISCHARGE INSTRUCTIONS:  1.  Her activity level is to be increased slowly.  2.  She is to use Tylenol for any pain.  3.  She is to stick to a low-sodium diet.   DISCHARGE MEDICATIONS:  1.  Klonopin 1 mg daily.  2.  Spiriva daily.  3.  __________ 7.5 mg each day.  4.  Albuterol inhalers as prior to admission.  5.  Potassium 10 mEq b.i.d.  6.  Home blood pressure medications are to be continued, and she is to bring      a list of all of her medications to the  office on Monday.   She is to follow up with Dr. Daleen Squibb on January 8 at 3:45 and at the Coumadin  clinic on November 27 at 2:45.  She is to follow up with Dr. Jonny Ruiz as needed.      Theodore Demark, P.A. [facility]      Thomas C. Wall, M.D.  Electronically Signed    RB/MEDQ  D:  12/05/2004  T:  12/05/2004  Job:  [id number]   cc:   Corwin Levins, M.D. [facility]  [address]  [place]  [place] [address]

## 2010-05-29 NOTE — Assessment & Plan Note (Signed)
[facility]                            CARDIOLOGY OFFICE NOTE   [name] [name], [name] [name]                       MRN:          [id number]  DATE:01/25/2006                            DOB:          1950-09-06    [name] [name] returns today for further management of her paroxysmal  atrial fibrillation. She has had no recurrence, is clinically on Rythmol  325 mg p.o. b.i.d. She is actually doing pretty well.   She did have a stress Myoview where we made her exercise. She had no  proarrhythmia. She did however, drop her O2 saturation to 77% at peak  exercise. Her lungs were clear.   Ejection fraction was 71% with no sign of ischemia or scar. She had had  normal coronary arteries by catheterization in 2003.   She has a history of pulmonary embolus and is on Coumadin. She also has  a history of hypertension. She has fairly severe chronic obstructive  pulmonary disease as outlined above and is followed by Dr. Sherene Sires. She is  still working on disability from working in [employer].   Her medicines are;  1. Coumadin as directed.  2. Benicar HCT 40/25 q. day.  3. Potassium 10 mEq a day.  4. Fluoxetine 20 mg a day.  5. Protonix 40 mg a day.  6. Rythmol 325 mg p.o. b.i.d.  7. Diltiazem CD 180 q. day.  8. Symbicort 80/0.5 two puffs b.i.d.   Her blood pressure is 140/83, her pulse is 76 and irregular. Her weight  is 265. She looks remarkably better today than I have seen her in some  time.  HEENT: Normocephalic, atraumatic, PERRLA. Extraocular movements intact.  Sclera clear. Facial symmetry is normal. Dentition is satisfactory.  Carotid upstrokes are equal bilaterally without bruits. There is no JVD.  Thyroid is not enlarged.  LUNGS: Clear, without rhonchi or wheezes.  HEART: Reveals a nondisplaced PMI, she has a normal S1, S2.  ABDOMINAL EXAM: Soft, good bowel sounds.  EXTREMITIES: Reveal no cyanosis, clubbing, and only trace edema, pulses  are present.  SKIN:  Shows a few ecchymoses otherwise, benign.   EKG: Demonstrates sinus rhythm with nonspecific ST segment  changes. Her  QTC is 409 milliseconds. Her PR and QRS are also normal.   ASSESSMENT/PLAN:  [name] [name] is doing well on her current medical  program. I think her atrial fibrillation is precipitated by her lungs  seeing that it first occurred when she had the chronic obstructive  pulmonary disease flare.   I have made no changes in her program. I will plan on seeing her back in  6 months.     Thomas C. Daleen Squibb, MD, [facility]  Electronically Signed    TCW/MedQ  DD: 01/25/2006  DT: 01/25/2006  Job #: [id number]

## 2010-05-29 NOTE — Assessment & Plan Note (Signed)
[facility]                              CARDIOLOGY OFFICE NOTE   [name] [name], [name] [name]                       MRN:          [id number]  DATE:10/19/2005                            DOB:          May 28, 1950    This is a patient of Dr. Daleen Squibb and Dr. Ladona Ridgel.  She is seen as an add-on for  a rash.   [name] [name] is a pleasant 60 year old married white female patient who has a  complicated medical history.  She was just discharged from the hospital this  past Saturday after a second admission for recurrent atrial fibrillation  twice in the past three weeks.  She was placed on flecainide and underwent  successful DC cardioversion.  When she left the hospital, she broke out in a  rash behind her legs, knees, arms, and on her face.  She called and was told  to stop her Coreg, but this has not improved.  She had been on Coreg prior  to this hospitalization as well.   It should be noted that she was seen by Dr. Marcelyn Bruins yesterday for  worsening of her emphysema and was treated with Xopenex and Pulmicort.   The patient also states that she is supposed to return to work tomorrow and  is not feeling well enough to do this with her ongoing medical problems.   CURRENT MEDICATIONS:  1. Coumadin as directed.  2. Benicar/HCTZ 40/25 daily.  3. Klor-Con 10 mEq daily.  4. Fluoxetine 20 mg daily.  5. Cardizem 240 mg daily.  6. Protonix 40 mg daily.  7. Xopenex b.i.d.  8. Pulmicort 2 puffs q.12h.   PHYSICAL EXAMINATION:  GENERAL:  This is an overweight 60 year old white  female in no acute distress.  VITAL SIGNS:  Blood pressure 124/78, pulse 76.  Weight 246.  NECK:  Without JVD, HJR, bruit, or thyroid enlargement.  LUNGS:  Clear anterior, posterior, and lateral.  Decreased breath sounds.  HEART:  Regular rate and rhythm at 76 beats per minute.  Normal S1 and S2.  No murmur, rub, bruits, thrill, or heave noted.  ABDOMEN:  Obese.  Normoactive bowel sounds are  heard throughout.  EXTREMITIES:  Without clubbing, cyanosis or edema.  She has good distal  pulses.  She does have a typical drug rash on her bilateral lower  extremities as well as her arms and underneath her right eye.   EKG:  Normal sinus rhythm.  Nonspecific ST-T wave changes.  Inferior Q  waves.  No acute change.   IMPRESSION:  1. Drug rash, most likely secondary to flecainide.  2. Paroxysmal atrial fibrillation, status post DC cardioversion x2 in the      past three weeks, most recently on October 16, 2005 while on flecainide.  3. Emphysema with exacerbation yesterday.  4. History of deep venous thrombosis and pulmonary embolus with question      of hypercoagulable state.  5. Chronic Coumadin therapy.  6. Gastroesophageal reflux disease and hiatal hernia.  7. Morbid obesity.  8. Depression.   PLAN:  After discussing with Dr. Gala Romney and Dr.  Graciela Husbands, we recommend  stopping the flecainide, give a 24-hour washout, and starting her on Rythmol  SR 325 mg b.i.d. beginning tomorrow.  She is also to resume her Coreg and  continue taking Benadryl for the rash.  She will see Dr. Ladona Ridgel back in two  weeks followup and is advised that she may stay out of work for at least  another week and has been given a note indicating this.      ______________________________  Jacolyn Reedy, PA-C    ______________________________  Bevelyn Buckles. Bensimhon, MD    ML/MedQ  DD:  10/19/2005  DT:  10/20/2005  Job #:  [id number]

## 2010-05-29 NOTE — Cardiovascular Report (Signed)
[place]  Patient:    [name] [name], [name] [name] Visit Number: [id number] MRN: [id number]          Service Type: MED Location: [phone] Attending Physician:  Corwin Levins Dictated by:   Rollene Rotunda, M.D. [facility] Proc. Date: 01/25/01 Admit Date:  11/22/2000 Discharge Date: 11/26/2000   CC:         Corwin Levins, M.D. [facility]   Cardiac Catheterization  PRIMARY CARE PHYSICIAN:  Corwin Levins, M.D.  PROCEDURE:  Left heart catheterization/coronary arteriography.  INDICATIONS:  Evaluate patient with chest pain and a Cardiolite suggestive of inferoapical ischemia.  The patient also has multiple cardiovascular risk factors.  PROCEDURAL NOTE:  Left heart catheterization was performed via the right femoral artery.  The artery was cannulated using an anterior wall puncture.  A #6 French arterial sheath was inserted via the modified Seldinger technique. Preformed Judkins and a pigtail catheter were utilized.  The patient tolerated the procedure well and left the lab in stable condition.  RESULTS:  Hemodynamics:  LV 160/20, AO 159/82.  Coronaries reveal: 1. The left main was normal. 2. The LAD was normal. 3. The circumflex was normal. 4. The right coronary artery was a large dominant vessel and was normal.  Left ventriculogram:  A left ventriculogram was obtained in the RAO projection.  The EF was 65% with normal wall motion.  CONCLUSIONS:  Normal coronary arteries.  Normal left ventricular function.  No further cardiovascular testing is planned at this point.  If she has continued chest pain, she will follow up with Dr. Jonny Ruiz for evaluation of nonanginal discomfort.  She was counseled regarding primary risk factor modification. Dictated by:   Rollene Rotunda, M.D. [facility] Attending Physician:  Corwin Levins DD:  01/25/01 TD:  01/25/01 Job: [id number]

## 2010-05-29 NOTE — Assessment & Plan Note (Signed)
[facility]                              CARDIOLOGY OFFICE NOTE   NAME:[name] [name], [name] [name]                       MRN:          [id number]  DATE:                                      DOB:          [birth year]    ADDENDUM:  It should be noted that the patient was inquiring about extending her time  off from work as well as disability.  It is our opinion that the patient may  return to work from a cardiac standpoint.  At this point in time, there is  no cardiac reason for her to have disability.  Should we find something on  her event monitor that would dictate otherwise, we will certainly update our  recommendations.      ______________________________  Tereso Newcomer, PA-C    ______________________________  Jesse Sans. Daleen Squibb, MD, [facility]    SW/MedQ  DD:  10/06/2005  DT:  10/08/2005  Job #:  [id number]   cc:   Corwin Levins, MD  Charlaine Dalton. Sherene Sires, MD, FCCP

## 2010-05-29 NOTE — Op Note (Signed)
NAMEJAYMIE, MCKIDDY NO.:  [id number]   MEDICAL RECORD NO.:  [id number]          PATIENT TYPE:  INP   LOCATION:  4731                         FACILITY:  [facility]   PHYSICIAN:  Luis Abed, MD, FACCDATE OF BIRTH:  1950-12-31   DATE OF PROCEDURE:  DATE OF DISCHARGE:  10/16/2005                                 OPERATIVE REPORT   PROCEDURE PERFORMED:  Cardioversion.   MEDICATIONS:  The patient had been started on flecainide and a cardioversion  is planned for today.   ANESTHESIA:  Anesthesia is present.  The patient received 200 mg of IV  Pentothal.   PROCEDURE DETAILS:  Anterior-posterior pads are in place with the biphasic  defibrillator.  The patient received 120 joules and she remained in atrial  fibrillation.  She then received 200 joules with anterior chest pressure,  and she converted to sinus rhythm.  She is stable.           ______________________________  Luis Abed, MD, [facility]     JDK/MEDQ  D:  10/15/2005  T:  10/17/2005  Job:  [id number]   cc:   Doylene Canning. Ladona Ridgel, MD

## 2010-05-29 NOTE — Assessment & Plan Note (Signed)
[facility]                             PULMONARY OFFICE NOTE   SHENICA, HOLZHEIMER                       MRN:          [id number]  DATE:12/22/2005                            DOB:          01-07-1951    This is a 60 year old white female with morbid obesity and COPD who  returns for follow-up evaluation stating that she is no better since  her last visit on November 21, at which time I wrote her out a very  detailed medication calendar and spent extra time going over each and  every medication and how to take it.  She says she has progressed to the  point where sometimes she is short of breath at rest and gives out after  no more than 50 feet.  Xopenex does not seem to help.  She denies any  exertional chest pain, orthopnea, or PND.   She has added to her present medication calendar to the point where I am  convinced she does not actually follow it and does not understand how to  institute it.  (See comments below).  I did employ the medications as  they are listed in the column dated December 22, 2005, but do not have  any confidence that she actually takes the medicines as directed.   PHYSICAL EXAMINATION:  GENERAL:  She is an ambulatory white female with  a depressed affect and attitude.  She is depressed, hopeless, helpless  affect and attitude.  VITAL SIGNS:  She is afebrile on vital signs.  HEENT:  Unremarkable.  Oropharynx clear.  LUNGS:  Lung fields are completely clear bilaterally to auscultation and  percussion.  HEART:  Rhythm abnormal without murmur.  ABDOMEN:  Soft and benign.  EXTREMITIES:  Without calf tenderness, cyanosis, or clubbing.   We walked the patient around the office and she made it 185 feet with  saturations falling to 87% before she gave out due to dyspnea with pulse  rate of only 86.   IMPRESSION:  This patient has combined obesity, deconditioning, and  chronic obstructive pulmonary disease with an emphysematous  component  (see previous PFT's dated December 01, 2005).  Fortunately she does not  have much of an asthmatic component and what the reversible component  appears to be mostly upper airway in nature.   I therefore went over a strategy for her to optimize treatment directed  at reflux by increasing Protonix up to 40 mg b.i.d. and reviewed with  her optimal use of Xopenex by placing it on the proper place on the  medication calendar, but also emphasizing optimal MDI technique so that  she can use it immediately before exercise.   This patient appears to have progressive depression despite reporting  that she takes Prozac daily.  I am actually not confident at all that  she follows the medications they way they are written and will need to  use a trust but verify approach with her with ask her to follow up in  2 weeks with two bags of medicines to make sure there is 100%  correspondence to what  she  says she does versus what is written down on her calendar.  I would have  a low threshold to consider doubling the Prozac dose on her next visit.     Charlaine Dalton. Sherene Sires, MD, [facility]  Electronically Signed    MBW/MedQ  DD: 12/23/2005  DT: 12/23/2005  Job #: [id number]   cc:   Corwin Levins, MD

## 2010-05-29 NOTE — Discharge Summary (Signed)
NAMEINIOLUWA, BOULAY NO.:  [id number]   MEDICAL RECORD NO.:  [id number]          PATIENT TYPE:  INP   LOCATION:  3702                         FACILITY:  [facility]   PHYSICIAN:  Arturo Morton. Riley Kill, MD, FACCDATE OF BIRTH:  November 02, 1950   DATE OF ADMISSION:  09/14/2005  DATE OF DISCHARGE:  09/24/2005                                 DISCHARGE SUMMARY   PRIMARY CARDIOLOGIST:  Maisie Fus C. Daleen Squibb, MD, [facility]   PRIMARY CARE PHYSICIAN:  Corwin Levins, MD   PULMONOLOGIST:  Charlaine Dalton. Sherene Sires, MD, FCCP   DISCHARGE DIAGNOSES:  1. Atrial fibrillation with rapid ventricular response, status post      transesophageal echocardiogram this admission with cardioversion by Dr.      Olga Millers.  2. Chronic obstructive pulmonary disease with respiratory distress this      admission.  Pulmonary consult by Dr. Delford Field on September 14, 2005.      Status post CT of the chest, showed no pulmonary embolism, no acute      findings.  3. Anticoagulation therapy with Coumadin secondary to history of pulmonary      embolus and deep venous thrombosis in 1997.  4. History of asthma.  5. Gastroesophageal reflux/hiatal hernia.  6. Morbid obesity.  7. Depression.   PAST MEDICAL HISTORY:  1. Anxiety/depression.  2. Hypertension.  3. Osteoporosis.  4. Allergy to PENICILLIN.  5. Prior cardiac catheterization in 2003 which showed normal coronaries      and normal left ventricular function.  6. Impaired glucose tolerance.  7. History of questionable hypercoagulable state.   HOSPITAL COURSE:  Ms. Aylesworth is a 60 year old Caucasian female followed by  Dr. Daleen Squibb and Dr. Jonny Ruiz, who has a history of pulmonary embolus and deep  venous thrombosis and a question of hypercoagulable state, who presented to  [facility] on the day of admission complaining of presyncope,  dizziness, and chest heaviness.  Patient was found to be in rapid atrial  fibrillation, which was new.  Patient was admitted to the hospital  for heart  rate control.  We also asked pulmonary to evaluate the patient's asthma, as  she was having some respiratory compromise at the time of admission.  She  was also maintained on her Coumadin, which she had previously been placed on  secondary to history of PE's and DVT's.  Dr. Delford Field saw the patient in  consultation on the day of admission.  Placed the patient on Protonix.  Recommended checking PFTs and a CTA of the chest to rule out pulmonary  embolus and placed the patient on Xopenex.  A room air blood gas on  September 14, 2005 showed a pO2 of 92, pCO2 43.9, pH 7.418, and an O2 sat of  97.9%.  The patient's heart rate treated with Cardizem.  The patient was  also placed on heparin, as her INR was subtherapeutic at 1.6.  CT of the  chest done on September 5 showed no pulmonary embolism.  Patient continued  to improve.  Heart rate still fluctuating.  Adjustments made in medication.  Blood pressure stable.  The  patient was still in atrial fib; however, with  any ambulation, heart rate would increase to 160s to 180s.  Low dose beta  blocker was added, as patient tolerated; however, the patient began to  experience chest discomfort with increased heart rate.  Cardiac enzymes were  negative.  EP was asked to consult regarding variations in heart rate from  40s to 150s with greater than 2 second pauses.  It was recommended that  patient have a cardioversion done under TEE.  Dr. Jens Som proceeded with  TEE with cardioversion on September 13.  Found patient to have normal LV  function.  No LAA thrombus, with resolution of atrial fibrillation to normal  sinus rhythm.  Patient tolerated the procedure without complications.  Dr.  Riley Kill in to see patient on the day of discharge.  Patient states that she  is ready to go home.  Blood pressure is stable at 108/65 with a heart rate  of 66.  Patient satting at 97% on room air.  Lungs clear.  EKG showing a  normal sinus rhythm.   LAB WORK AT  THE TIME OF DISCHARGE:  PT of 33.2 with an INR of 3.  Last  chemistry on September 8th showing sodium 143, potassium 4.3, BUN 13,  creatinine 1.  CBC done on September 7th:  H&H of 12 and 34.5 with a WBC of  8.7 and a platelet count of 206,000.  TSH level done on September 4th at  2.882.   Dr. Sherene Sires also in to see patient prior to discharge.  Noted that patient is  able to ambulate in the hall without shortness of breath or chest  discomfort.  Okay to discharge patient home with Xopenex and follow up in  the office.   At the time of discharge, patient's instructions include a low fat, low  sodium diet.  Activity as tolerated.   MEDICATIONS:  1. K-Dur 10 mEq daily.  2. Patient may continue the Klor-Con she was previously on.  3. Prozac 20 mg daily or previous dose.  4. Benicar/HCT 40/25 mg daily.  5. Coumadin per Coumadin clinic, as previously instructed.   Her new medications include Lanoxin 0.25 mg daily, Coreg 3.125 mg b.i.d.,  Cardizem 360 daily, Ceftin 250 mg p.o. b.i.d. x3 more days, Protonix 40 mg  daily, Pulmicort inhaler b.i.d., Xopenex 2 puffs inhaled every 8 hours as  needed.   FOLLOW-UP APPOINTMENTS:  1. Coumadin clinic, Monday, September 17th at 2:00 p.m.  2. Dr. Vern Claude PA on September 26th at 10:15.  3. Dr. Sherene Sires, October 17th, at 2:45 (this was the first appointment they      have available).  4. Dr. Jonny Ruiz at [phone] within the next 3-4 weeks.  Unable to make an      appointment at this time, as the office is not answering the phone for      appointments.   Duration of discharge encounter is 40 minutes.    ______________________________  Dorian Pod, ACNP      Arturo Morton. Riley Kill, MD, [facility]  Electronically Signed   MB/MEDQ  D:  09/24/2005  T:  09/24/2005  Job:  [id number]

## 2010-05-29 NOTE — Assessment & Plan Note (Signed)
[facility]                             PULMONARY OFFICE NOTE   DAKIA, SCHIFANO                       MRN:          [id number]  DATE:05/06/2006                            DOB:          [birth year]    HISTORY OF PRESENT ILLNESS:  The patient is a 60 year old white female  patient of Dr. Thurston Hole who has a known history of COPD and rhinitis who  presents for an acute office visit.  The patient complains of a one-week  history of nasal congestion, productive cough with thick green sputum,  wheezing, fever, chills, and sore throat.  The patient denies any  hemoptysis, orthopnea, PND, or leg swelling.  The patient has started on  Mucinex DM without much relief.   PAST MEDICAL HISTORY:  Reviewed.   CURRENT MEDICATIONS:  Reviewed.   PHYSICAL EXAMINATION:  GENERAL:  The patient is a pleasant, morbidly  obese female in no acute distress.  VITAL SIGNS:  She is afebrile with stable vital signs.  HEENT:  Nasal mucosa is slightly pale.  Nontender sinuses.  Posterior  oropharynx is clear.  NECK:  Supple without cervical adenopathy.  LUNGS:  Coarse breath sounds without any wheezing or crackles.  CARDIAC:  Regular rate.  ABDOMEN:  Soft and nontender.  EXTREMITIES:  Warm without any calf cyanosis, clubbing.  There is trace  edema.   IMPRESSION AND PLAN:  1. Acute tracheobronchitis.  The patient is to begin Avelox x5 days.      Continue on Mucinex DM twice a day.  The patient may use tramadol      50 mg every four hours as needed for breakthrough coughing.  The      patient is to continue on her nasal hygiene regimen with Veramyst,      saline, and Afrin nasal spray for five days.  Instruction sheet      given to patient.  The patient will return back with Dr. Sherene Sires as      scheduled or sooner if needed.  2. Complex medication regimen.  The patient's medications were      reviewed in detail.  Patient education was provided.  The patient's      computerized  medication calendar was adjusted accordingly and      reviewed with the patient.     Rubye Oaks, NP  Electronically Signed      Charlaine Dalton. Sherene Sires, MD, [facility]  Electronically Signed   TP/MedQ  DD: 05/06/2006  DT: 05/06/2006  Job #: [id number]

## 2010-05-29 NOTE — H&P (Signed)
[place]  Patient:    [name] [name], [name] [name]                         MRN: [id number] Adm. Date:  03/02/99 Attending:  Francisca December, M.D. Dictator:   Lavonia Dana, M.D.                         History and Physical  CHIEF COMPLAINT:  Chest pain.  HISTORY OF PRESENT ILLNESS:  A 60 year old white female with history of negative catheterization in 1993, DVT, and pulmonary embolism diagnosed in 1997, currently on Coumadin (secondary tobacco abuse and oral contraceptives most likely).  He ad onset of 2 minutes of chest pain earlier this a.m.  This resolved then came again described as heaviness at approximately 1700.  No radiation, no nausea, vomiting, or diaphoresis.  No shortness of breath.  Current pain is about a 5/10; at its worst, it was about a 7/10.  No response to nitroglycerin or morphine.  No worse with movement.  Cannot reproduce by pressing on the site of tenderness, no exertional component.  PAST MEDICAL HISTORY:  As above.  No diabetes.  Positive hypertension.  PAST SURGICAL HISTORY:  Tonsillectomy, breast biopsy, tubal ligation.  ALLERGIES:  PENICILLIN.  MEDICATIONS: 1. Lasix 20 mg q.d. 2. Coumadin 10 mg every day of the week except for Wednesday and Saturday when    she takes 5 mg. 3. Zestril 5 mg q.d. 4. Calcium and iron supplements.  No other medications.  FAMILY HISTORY:  Mother died of MI at 59.  Father died of end-stage liver disease at 48.  SOCIAL HISTORY:  She works at [employer].  She stopped smoking five years ago. he is married with three children.  This is at least her second marriage.  REVIEW OF SYSTEMS:  As above.  All other systems negative.  PHYSICAL EXAMINATION:  VITAL SIGNS:  Temperature 99.3, blood pressure 124/57, pulse 80, respirations 16, O2 saturation 95% on room air.  GENERAL:  Sleepy secondary to narcotics being given but conversive.  NECK:  Supple.  No JVD.  CHEST:  Clear to  auscultation bilaterally.  No crackles.  CARDIOVASCULAR:  Normal S1, S2 without murmur.  Regular rhythm.  ABDOMEN:  Soft, positive bowel sounds.  EXTREMITIES:  No edema.  NEUROLOGIC:  Nonfocal.  LABORATORY DATA:  EKG with normal sinus rhythm, no ST depression or elevation, unchanged from previous.  White blood cell count 7.8, hemoglobin 11.9, hematocrit 33.7, platelets 282. CK 93, MB 1.5, troponin 0.03.  INR 1.7, PTT 32.  CT of chest negative for pulmonary embolism or other significant pathology.  ASSESSMENT/PLAN:  A 60 year old white female with history of atypical chest pain. No EKG changes and no history of coronary artery disease.  With continuing pain and inability to say that her pain has not caused elevated enzymes in relation to the time of onset, will definitively rule out with serial enzymes and will leave further workup to primary.  Will not start heparin at this time, only aspirin unless enzymes are elevated. DD:  03/02/99 TD:  03/02/99 Job: [id number]

## 2010-05-29 NOTE — Discharge Summary (Signed)
[facility]  Patient:    [name] [name], [name] [name] Visit Number: [id number] MRN: [id number]          Service Type: PSY Location: [facility] Attending Physician:  Denny Peon Dictated by:   Reymundo Poll Dub Mikes, M.D. Admit Date:  10/18/2000 Discharge Date: 10/27/2000                             Discharge Summary  CHIEF COMPLAINT AND PRESENTING ILLNESS:  This was the first admission to [facility] for this 60 year old female, voluntarily admitted after an intentional overdose on alcohol and an unspecified amount of medication. She was tearful, sobbing, and still drowsy upon admission.  Did not remember what happened.  She took a whole bottle of Zoloft and some whiskey.  She reported depression over the past 4-6 weeks, decreased sleep, increased sadness, feeling very hopeless and helpless.  The main concern was job stress, being threatened with dismissal at her primary work.  The supervisor gave her a performance warning and told her that she would be fired if her performance did not improve.  The patient denied any visual hallucinations.  There is some question if she has had auditory hallucinations, not clear at the time of the evaluation.  She continued to endorse suicidal ideas, but denies that she would hurt herself in the hospital.  PAST PSYCHIATRIC HISTORY:  Sees Dr. Sudie Grumbling, her primary care Ainslie Mazurek, for antidepressant.  Has seen Carlye Grippe but has not been followed by a psychiatrist in awhile.  SUBSTANCE ABUSE HISTORY:  It is unclear but the drug screen was negative and she denies the regular use of alcohol.  MEDICAL HISTORY:  Deep venous thrombosis, osteoporosis, history of pulmonary thrombus, and obesity.  MEDICATIONS:  Zoloft, psychosis, Coumadin, Lasix, hyzaar and actinon.  MENTAL STATUS EXAMINATION:  Reveals an obese female, disheveled, in bed with hospital gown, sobbing, and refusing to open her eyes.  With encouragement, she  does open her eyes.  She sits up and walks.  Blunt affect, depressed mood. Speech was normal, still some alogia.  Mood is depressed, hopeless, helpless. Thought process is logical and appropriate, positive for suicidal ideas with plan, no intent, promised safety.  No homicidal ideas.  No hallucinations. Cognition well preserved.  ADMITTING  DIAGNOSES: Axis I:    Major depression, recurrent, severe. Axis II:   No diagnosis. Axis III:  Status post drug overdose, deep venous thrombosis, osteoporosis,            obesity. Axis IV:   Moderate. Axis V:    Global assessment of function upon admission 35, highest            global assessment of function in past year 60-65.  LABORATORY WORK-UP:  Was within normal limits.  COURSE IN THE HOSPITAL:  She was admitted and started on intensive individual and group psychotherapy.  As she settled in the unit, her mood started improving.  Her affect became brighter.  She was able to talk about her stressors.  She was able to explore ways of helping the stress.  A session with the husband was positive.  Slowly started being less depressed.  On October 7, she was much improved.  Her affect was brighter.  Her mood was euthymic.  No suicidal or homicidal ideas.  She felt well.  Her situation had improved in terms of her economic situation.  As well, her daughter has given her some money which  has reduced acute financial stress.  In terms of job, she will look into different employment options.  She was willing and motivated to continue follow up through the IOP program.  DISCHARGE  DIAGNOSES: Axis I:    Major depression, recurrent, severe. Axis II:   No diagnosis. Axis III:  Deep venous thrombosis, osteoporosis and obesity. Axis IV:   Moderate. Axis V:    Global assessment of function upon discharge 55-60.  DISCHARGE MEDICATIONS:  1. Zoloft 150 per day.  2. Risperdal 0.25 twice a day.  3. glucosamine 1 every day.  4. Actinon 5 mg every day.  5.  Potassium 10 mEq daily.  6. Advair 1 puff every day.  7. Os-Cal 500 twice a day.  8. Celebrex 200 daily.  9. Lasix 20 mg daily. 10. Allegra.  FOLLOW UP:  To be followed at the mental health [facility] and [facility] and she is going to continue outpatient followup with Dr. Lourdes Sledge. Dictated by:   Reymundo Poll Dub Mikes, M.D. Attending Physician:  Denny Peon DD:  11/12/00 TD:  11/14/00 Job: [id number]

## 2010-05-29 NOTE — Assessment & Plan Note (Signed)
[facility]                             PULMONARY OFFICE NOTE   [name] [name], [name] [name]                       MRN:          [id number]  DATE:04/29/2006                            DOB:          1950/05/08    HISTORY:  [name] is a 60 year old, white female with morbid obesity  status post smoking cessation in 2003, with chronic dyspnea on exertion  that has been disproportionate to FEV-1 and consistent with obesity with  deconditioning given the fact that she had an FEV-1 of 51 percent  predicted documented in November 2007.   We did document that she was able to walk over 120 yards before  desaturating when she was here previously.  She states that she is  presently limited to walking about ten minutes on a flat surface at a  slow pace before she gives out.  She uses albuterol several times a  day, in addition to being maintained on Symbicort 80/4.5 two puffs  b.i.d.   The patient's new complaint is one of one month history of persistent  nasal and chest congestion with sore throat.  She admits that this seems  to have developed since stopping Protonix and also has not been using  Veramyst consistently.  She denies chest pain, purulent sputum,  orthopnea, pnd or nocturnal wheeze, fever chills or leg swelling.   PHYSICAL EXAMINATION:  GENERAL:  She is a somewhat depressed appearing,  obese, ambulatory, white female in no acute distress.  VITAL SIGNS:  She had stable vital signs.  HEENT:  Unremarkable.  Oropharynx clear.  NECK:  Supple without cervical adenopathy or tenderness.  Trachea is  midline.  No thyromegaly.  LUNGS:  Fields completely clear bilaterally to auscultation and  percussion.  HEART:  Regular rate and rhythm, without murmur, gallop or rub.  ABDOMEN:  Soft, benign.  EXTREMITIES:  Warm without calf tenderness, cyanosis, clubbing, or  edema.   IMPRESSION:  1. Chronic obstructive pulmonary disease plus obesity explains most of   this patient's respiratory problems.  She has already tried spiriva      and didn't feel it helped, so really nothing else to offer for the      fixed compent of her airflow obstruction.  2. Morbid obesity needs to continue to be addressed by the patient in      the context of dietary followup, which apparently has not occurred      but needs to be a routine.  3. Her present rhinitis symptoms are nonspecific but suggest the need      to both increase proton pump inhibitor up to b.i.d., at least until      better and then back down to one a day, as well as treat rhinitis      more aggressively.   In the context of diagnosis and management of chronic rhinitis, I did  the following:  1. First, I reviewed a sinus CT scan that was negative from March 02, 2006.  2. I reviewed a chronic rhinitis flier with her with both graphic and  text formatted material that gives her very specific instructions      on how to use Afrin for the first five days of any nasal flareup.   FOLLOWUP:  I would like to see the patient back in three months, sooner  if needed.     Charlaine Dalton. Sherene Sires, MD, [facility]  Electronically Signed    MBW/MedQ  DD: 04/29/2006  DT: 04/29/2006  Job #: [id number]   cc:   Corwin Levins, MD

## 2010-05-29 NOTE — H&P (Signed)
[name] [name], RAYA NO.:  [id number]   MEDICAL RECORD NO.:  [id number]          PATIENT TYPE:  INP   LOCATION:  4731                         FACILITY:  [facility]   PHYSICIAN:  Doylene Canning. Ladona Ridgel, MD    DATE OF BIRTH:  [birth year]   DATE OF ADMISSION:  10/13/2005  DATE OF DISCHARGE:                                HISTORY & PHYSICAL   ADMISSION DIAGNOSIS:  Atrial fibrillation (recurrent) with a rapid  ventricular response.   HISTORY OF PRESENT ILLNESS:  The patient is a very pleasant 60 year old  woman with a history of atrial fibrillation and rapid ventricular response  who is followed by Dr. Juanito Doom now.  She has a known history of pulmonary  embolism x2 and a history of morbid obesity and COPD.  She was admitted the  hospital back in early September and underwent TE-guided DC cardioversion  restoring sinus rhythm.  She was placed on Coumadin.  She at that time was  seen by Dr. Danise Mina and a host of bronchodilators were recommended along  with Protonix.  She was in her usual state of health until yesterday when  she developed recurrent palpitations and increasing dyspnea and is admitted  to hospital where she is found to be in atrial fibrillation and a rapid  ventricular response.  She has been treated with IV Cardizem with  improvement in her symptoms.   PAST MEDICAL HISTORY:  Also notable for dyslipidemia and hypertension.   SOCIAL HISTORY:  Is notable for her being married and living with her  husband.  She worked [employer] as a Scientist, product/process development.  She denies tobacco or  ethanol use, though stopped smoking approximately 5 years ago.  She smoked  from age 39 to age 7.   FAMILY HISTORY:  Is notable for mother dying at 24 of hypertension and MI  and father dying age 80 of complications of alcohol abuse.   REVIEW OF SYSTEMS:  Is notable for dyspnea and shortness of breath, PND.  She also has palpitations as previously noted.  She has generalized  arthralgias  and joint swelling which has been chronic.  She denies any  recent fevers or chills.  The rest of review of systems was reviewed and all  systems reviewed and found to be negative except as noted above and for  nocturia times 2/3.   PHYSICAL EXAMINATION:  She is a pleasant middle-aged obese woman in no acute  distress.  The blood pressure was 118/83. Her pulse was 90 and irregularly  irregular, respirations were 22.  Temperature is 97.4.  HEENT: Normocephalic and atraumatic.  Pupils equal and round.  Oropharynx  moist.  Sclerae anicteric.  NECK:  Was short and wide. I could not assess her JVD.  There is no obvious  thyromegaly.  Carotids 2+ symmetric.  LUNGS:  Reveal scattered rales and wheezes but no increased work of  breathing, there are no rhonchi.  CARDIOVASCULAR:  Exam was distant with an irregularly irregular rhythm.  Normal S1 and S2.  The PMI could not be assessed secondary to obesity.  ABDOMEN:  Is obese, nontender, nondistended.  There is no organomegaly.  Bowel sounds are present.  There is no rebound or guarding.  EXTREMITIES:  Demonstrated 1+ peripheral edema.  There is no cyanosis or  clubbing.  NEUROLOGIC:  Alert and oriented x3.  Cranial nerves intact.  Strength was  5/5 and symmetric.   LABORATORY DATA:  Notable for INR 3.3, a white blood cell count 8.7, a  platelet count of 256, hemoglobin of 13.6.  Her sodium was 142, potassium  4.1, creatinine 0.9.  Her liver panel was within normal limits.  Initial  point care cardiac markers were negative.   IMPRESSION:  1. Atrial fibrillation with a rapid ventricular response (recurrent).  2. Chronic obstructive pulmonary disease.  3. Morbid obesity.  4. History of multiple pulmonary emboli.  5. Chronic Coumadin therapy.  6. Hypertension.  7. History of tobacco abuse.   DISCUSSION:  The patient underwent TE-guided DC cardioversion 3 weeks ago  but has developed recurrent atrial fibrillation.  She still dyspneic despite   controlled ventricular rate.  This has been achieved by using IV Cardizem.  Will plan to admit the patient to the hospital, start flecainide 100 mg  twice daily and observe her for several days.  Her INR is presently  therapeutic.           ______________________________  Doylene Canning. Ladona Ridgel, MD     GWT/MEDQ  D:  10/13/2005  T:  10/14/2005  Job:  [id number]   cc:   Corwin Levins, MD

## 2010-05-29 NOTE — Op Note (Signed)
NAMESHATERIA, PATERNOSTRO NO.:  [id number]   MEDICAL RECORD NO.:  [id number]            PATIENT TYPE:   LOCATION:                                 FACILITY:   PHYSICIAN:  Madolyn Frieze. Jens Som, MD, [facility] DATE OF BIRTH:   DATE OF PROCEDURE:  09/23/2005  DATE OF DISCHARGE:                                 OPERATIVE REPORT   This is cardioversion of atrial fibrillation.  Transesophageal  echocardiogram was performed prior to the procedure that showed no left  atrial appendage thrombus.  The patient was subsequently sedated with  Pentothal 125 mg intravenously.  Synchronized cardioversion with 120 joules  (biphasic) resulted in normal sinus rhythm.  There were no immediate  complications.  We would recommend continuing Coumadin for at least 4  continuous weeks.           ______________________________  Madolyn Frieze Jens Som, MD, [facility]     BSC/MEDQ  D:  09/23/2005  T:  09/24/2005  Job:  [id number]

## 2010-05-29 NOTE — H&P (Signed)
[facility] ADMISSION   Cheralyn, Oliver CHANETTA MOOSMAN                       MRN:          [id number]  DATE:09/14/2005                            DOB:          1950-06-28    CHIEF COMPLAINT:  Chest pain.   HISTORY OF PRESENT ILLNESS:  This is a pleasant 60 year old white female  patient of Dr. Juanito Doom and Dr. Oliver Barre, who has a history of pulmonary  embolus and deep vein thrombosis and a question of a hypercoagulable state.  She has had a prior cardiac catheterization back in 2003 which showed normal  coronary arteries and LV function.   The patient now complains of a several-week history of dizzy, witnessed, as  well as chest pain.  She works in a [employer] and said when she bent over  to pick up spools, she became very lightheaded, had to sit down, and thought  she was going to pass out.  Since that episode, she has had several episodes  of chest heaviness mostly when she exerts herself but it occurs at rest as  well.  It passes after a short period of time.  When she presented to the  office today complaining of chest tightness, she was found to be in rapid  atrial fibrillation at a rate of 173 beats per minute.  She also has asthma  and has been wheezing and taking her inhalers.   ALLERGIES:  PENICILLIN.   CURRENT MEDICATIONS:  1. Tramadol HCl 1-2 four times a day p.r.n.  2. Coumadin as directed.  3. Benicar HCT once a day.  4. Klor-Con 10 mEq daily.  5. Fluoxetine 20 mg daily.  6. Prozac, she is not sure of the current dose but had been on 40 mg      daily.   PAST MEDICAL HISTORY:  Past medical history is significant for asthma.  Her  pulmonary embolus and DVT were in 1997, felt secondary to birth control  pills and tobacco as well as her obesity.  She had a recent admission in  January of 2007 in which her CT demonstrated abnormal filling defects within  her right main pulmonary artery and its  branches, consistent with acute  pulmonary emboli.  Hypercoagulable profile was drawn and showed variable  results with PTTLA that was high and PTTLA confirmation that was low or  normal.  PTPLA 4:1 mix that was high.  Dr. Daleen Squibb was not sure what all that  meant and felt we needed more specifics to determine how long she needed to  be on Coumadin.  She has COPD with remote smoking cessation and has been  followed by Dr. Sandrea Hughs.  History of hypertension, osteoporosis,  anxiety/depression, panic disorder, hysterectomy, tubal ligation, prior  breast surgery, chronic bronchitis.   FAMILY HISTORY:  Her mother died of an MI and hypertension at age 8.  Her  father died of cirrhosis and alcohol abuse at 66.  She has five brothers,  one died of cancer and had hypertension, and one has hypertension.   SOCIAL HISTORY:  She lives in [place] with her husband.  She works in a  [employer] that requires her to do a lot of lifting.  She has three  children.  She smoked for 27 years, one pack a day, and quit in 2005.  She  denies alcohol or drug abuse.   REVIEW OF SYSTEMS:  Review of systems is significant for dizziness and  presyncopal signs and symptoms.  Denies dyspepsia, dysphagia,  nausea,  vomiting, change in bowels or melena.  Cardiopulmonary:  Please see HPI.  She does have a history of anxiety and depression and chronic pain.   PHYSICAL EXAMINATION:  GENERAL:  This is an anxious, tearful, 60 year old  white female complaining of chest pain.  VITAL SIGNS:  Blood pressure 142/82, pulse 173, weight 249.  HEENT:  Head is normocephalic without signs of trauma.  Extraocular  movements are intact.  Pupils are equal and reactive to light and  accommodation.  Mucosa is moist.  Throat is without erythema or exudate.  NECK:  Without JVD, HJR, bruit, or thyroid enlargement.  LUNGS:  Decreased breath sounds with end expiratory wheezing in the upper  lung fields.  HEART:  Irregularly irregular at  170 beats per minute, normal S1 and S2,  distant heart sounds.  ABDOMEN:  Obese, normoactive bowel sounds heard throughout.  No  organomegaly, masses, or lesions noted.  EXTREMITIES:  Without cyanosis or clubbing.  She has mild edema bilaterally.  Positive distal pulses.   IMPRESSION:  1. Rapid atrial fibrillation, new for patient.  2. History of recurrent pulmonary embolus in November 2006.  Initial      pulmonary embolus and deep vein thrombosis in 1997 felt secondary to      birth control pills, tobacco abuse, and size.  3. Question of hypercoagulable state.  4. History of normal coronary arteries and ejection fraction of 65% by      catheterization in 2003.  5. Hypertension.  6. History of anxiety/depression.  7. Chronic obstructive pulmonary disease and asthma.  8. Osteoporosis.  9. Impaired glucose tolerance.   PLAN:  At this time, we need to admit the patient to the hospital for heart  rate control.  She will be seen by Dr. Sherene Sires to help with her asthma control  as well, and she will be maintained on Coumadin.                                  Jacolyn Reedy, PA-C                                Veverly Fells. Excell Seltzer, MD   ML/MedQ  DD:  09/14/2005  DT:  09/14/2005  Job #:  [id number]

## 2010-05-29 NOTE — H&P (Signed)
[facility]  Patient:    [name] [name], [name] [name] Visit Number: [id number] MRN: [id number]          Service Type: EMS Location: ED Attending Physician:  Doug Sou Dictated by:   Young Berry Scott, N.P. Admit Date:  10/11/2000 Discharge Date: 10/12/2000                     Psychiatric Admission Assessment  DATE OF ADMISSION:  October 12, 2000.  IDENTIFYING INFORMATION:  This is a 60 year old Caucasian female who is a voluntary admission after an intentional overdose on alcohol and an unspecified amount of medications.  HISTORY OF THE PRESENT ILLNESS:  The patient is tearful, sobbing, and still a bit drowsy this morning.  She says she does not remember what else she might have taken but did take in less than 1 whole bottle of Zoloft, and "some" whiskey.  The patient reports depression increased over the past 4-6 weeks, with decreased sleep, increased sadness, feeling very hopeless and helpless. The patient cites job stress and being threatened with dismissal as her primary stressors.  Yesterday, her supervisor gave her a performance warning and told her that she would be fired if her performance did not improve.  The patient denies any visual hallucination.  There is some question of if she has had auditory hallucinations, but she is not clear on this at this time, noting that she has been hearing some voices "in her sleep."  The patient continues to feel suicidal.  No evidence of homicidal ideation.  PAST PSYCHIATRIC HISTORY:  The patient sees Dr. Oliver Barre, her primary care Shakhia Gramajo, for her antidepressant.  She is a past patient of Dr. Carlye Grippe, but has not seen him in quite some time.  She reports a history of 1 prior inpatient admission to [facility] many years ago, and at least 1 prior suicide attempt.  SOCIAL HISTORY:  Patient is married and lives in [place], [place], with her husband.  She works at [employer] for the past 27 years and  recently reports difficulty performing her job secondary to her obesity and osteoarthritis in her feet and legs.  FAMILY HISTORY:  Unclear.  ALCOHOL AND DRUG HISTORY:  The patients complete history is unclear at this time.  Her urine drug screen is negative.  We do not have an alcohol level on her but we will get one.  She reports that she only had one drink of whiskey as part of her overdose.  PAST MEDICAL HISTORY:  Patient is followed by Dr. Oliver Barre at the [facility].  Medical problems are deep venous thrombosis, osteoporosis, history of pulmonary thrombi, and obesity.  The rest of her history is unclear at this time.  MEDICATIONS:  Include Zoloft, KCl, Coumadin, Lasix, hyzaar and Actonel in doses that are unclear.  We will contact Dr. Jonny Ruiz for a complete list of her medications.  ALLERGIES:  PENICILLIN and SEASONAL ALLERGIES.  POSITIVE PHYSICAL FINDINGS:  Patients physical examination was done in the [facility] Emergency Department where she was drowsy and basically unresponsive to questioning.  The patients vital signs in the emergency room were temp 99.7, pulse 99, respirations 18, blood pressure 151/91.  The patient had an Ewald tube placed for lavage and was treated with charcoal 100 g along with additional sorbitol.  She was also given 60 mEq of potassium for a potassium of 2.9 at that time.  In the emergency room, as already stated her potassium was 2.9.  Her glucose was 178.  CBC was within normal limits with mild elevation of RDW at 17.5 and neutrophils of 8.0.  The patients INR was 2.0, pro-time 19.7.  Acetaminophen level was less than 10, and salicylate level was less than 4.  The nurses on the unit here were unable to get a complete assessment because of patients somnolence and drowsiness when she arrived on the unit.  She is complaining of some chest pain on the skin over her sternum, which we are assuming at this point may be related to the ER doing sternal  rubs on her; however, she is able to sit up on the edge of the bed and walks to the nurses station today, although she still does appear quite disheveled and drowsy.  MENTAL STATUS EXAMINATION:  This is an obese Caucasian female who is disheveled.  She is in bed with a hospital gown.  She is sobbing and refusing to open her eyes, but after much encouragement does settle down and is arouseable.  She sits up and walks after much encouragement, and settles into quite a blunt affect.  Speech is normal, although it is tearful.  Some alogia is present.  Mood is depressed, hopeless, and helpless.  Thought process is logical and appropriate.  She is positive for suicidal ideation with plan and intent and promises safety on the unit.  She has no homicidal ideation, no visual hallucinations, and the auditory hallucinations are questionable and will require some further assessment.  Cognitively, she is intact to person, place and circumstance, but not to the date.  ADMISSION DIAGNOSES: Axis I:    Major depression, recurrent, severe. Axis II:   Deferred. Axis III:  Status post Zoloft overdose, deep venous thrombosis, osteoporosis,            and obesity. Axis IV:   Moderate problems related to job stress. Axis V:    Current 34, past year 91.  INITIAL PLAN OF CARE:  Voluntarily admit the patient to stabilize her mood, with a goal of alleviating her suicidal thoughts.  We will recheck stat labs on her and get a thyroid panel at that time.  We will ask the pharmacy to manage her Coumadin protocol, and will consider restarting her medications after we get a full list from Dr. Jonny Ruiz, and he has been contacted, to determine what exactly she is on since the patient is a poor historian at this point.  ESTIMATED LENGTH OF STAY:  Five to six days. Dictated by:   Young Berry Scott, N.P. Attending Physician:  Doug Sou DD:  10/12/00 TD:  10/12/00 Job: [id number]

## 2010-05-29 NOTE — Assessment & Plan Note (Signed)
[facility]                               PULMONARY OFFICE NOTE   [name] [name], [name] [name]                       MRN:          [id number]  DATE:11/12/2005                            DOB:          11-11-1950    HISTORY OF PRESENT ILLNESS:  This is a 60 year old white female patient of  Dr. Thurston Hole, who presents for a 2-week followup to review medications.  The  patient was seen last visit for progressively worsening shortness of breath  with minimum activity.  The patient also has documented hypoxemia with  activity.  Last visit the patient desaturated to 83% on room air after  walking 185 feet.  The patient was started on Symbicort 80/4.5 two puffs  twice daily last visit, and to use Xopenex on an as-needed basis.  The  patient returns today, reports that she does feel slightly improved;  however, continues to wear out very easily with minimum activity.  The  patient has currently not been able to work.  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  The patient denies any chest  pain, orthopnea, PND or increased leg swelling.   PAST MEDICAL HISTORY:  Reviewed.   CURRENT MEDICATIONS:  Reviewed.   PHYSICAL EXAMINATION:  The patient is a pleasant female in no acute  distress.  She is afebrile, with stable vital signs.  HEENT:  Unremarkable.  NECK:  Supple without cervical adenopathy or JVD.  LUNGS:  Lung sounds reveal diminished breath sounds in the bases, otherwise  clear.  CARDIAC:  Regular rate and rhythm.  ABDOMEN:  Soft without any hepatosplenomegaly.  EXTREMITIES:  Warm without any calf tenderness, cyanosis or clubbing.   IMPRESSION AND PLAN:  1. Chronic obstructive pulmonary disease with a suspected asthmatic      component.  The patient will continue on Symbicort 80/4.5 two puffs      twice daily, with Xopenex as needed.  She will recheck here in 4 weeks      for a full set of pulmonary function tests and follow  up with Dr. Sherene Sires.  2. Complex medication regimen.  The patient's medications were reviewed in      detail.  Patient education was provided.  The patient was provided with      a computerized medication calendar, which was reviewed in detail.  The      patient is aware to bring this back to each and every visit.     Rubye Oaks, NP  Electronically Signed    TP/MedQ  DD: 11/12/2005  DT: 11/12/2005  Job #: [id number]

## 2010-05-29 NOTE — Assessment & Plan Note (Signed)
[facility]                             PULMONARY OFFICE NOTE   LOLETTA, HARPER                       MRN:          [id number]  DATE:01/21/2006                            DOB:          [birth year]    HISTORY:  A 60 year old white female previously here for dyspnea  evaluation, now complaining of increasing cough and sinus and chest  congestion for the last several weeks, for which she is coughing  slightly yellowish mucus in the mornings.  She denies any fevers,  chills, sweats, orthopnea, PND, leg swelling or chest pain.   For list of medications, please face sheet dated January 21, 2006, but  note the patient did not bring her medicines back with her as  recommended in 2 separate bags corresponding to the groups of medicines  on the calendar.   PHYSICAL EXAMINATION:  She is a depressed-appearing obese white female,  in no acute distress.  VITAL SIGNS:  She had stable vital signs.  She has gained another 15  pounds since her previous visit.  HEENT:  Remarkable for mild to moderate turbinate edema.  Oropharynx is  clear.  NECK:  Supple without cervical adenopathy or tenderness.  Trachea is  midline.  LUNG FIELDS:  Reveal diminished breath sounds.  There is no wheezing at  all.  There is regular rhythm without murmur, gallop or rub.  ABDOMEN:  Soft and benign.  EXTREMITIES:  Warm without calf tenderness, cyanosis or clubbing.   IMPRESSION:  Acute decompensation appears to be related to nasal  congestion, for which I recommended Levaquin 750, number 5 to be taken  over 5 days and a prednisone Dosepak at 10-mg tablets, number 14.   The next step in the workup is to proceed with a sinus CT scan for her  nasal complaints and consider a nutrition evaluation after a TSH for her  weight control, which is becoming a major issue.   I went extra time with this patient going line by line over my previous  recommendation to make sure she got the  message in terms of the  difference in a maintenance and p.r.n. medicine, but I thought we should  use a trust but verifying approach to her in terms of truly  understanding how to take the medications by asking her to bring back 2  bags of medicines, 1 maintenance versus 1 p.r.n. on her next visit.  At  that time, nutrition evaluation with food diary needs to be arranged so  the pt can a get specific critique of what she's doing right vs wrong in  regards to calorie reduciton, just as we've done with her meds, and a  TSH needs to be checked.     Charlaine Dalton. Sherene Sires, MD, [facility]  Electronically Signed   MBW/MedQ  DD: 01/21/2006  DT: 01/21/2006  Job #: [id number]

## 2010-05-29 NOTE — Consult Note (Signed)
NAMESHERIAL, EBRAHIM                ACCOUNT NO.:  [id number]   MEDICAL RECORD NO.:  [id number]          PATIENT TYPE:  INP   LOCATION:  3703                         FACILITY:  [facility]   PHYSICIAN:  Charlcie Cradle. Delford Field, MD, FCCPDATE OF BIRTH:  May 12, 1950   DATE OF CONSULTATION:  DATE OF DISCHARGE:                                   CONSULTATION   CHIEF COMPLAINT:  Respiratory distress.   HISTORY OF PRESENT ILLNESS:  This is a 60 year old white female admitted  with chest discomfort, shortness of breath and rapid atrial fib originally  followed by Dr. Patty Sermons, was taken off Coumadin with recurrent PE and deep  venous thrombosis in November of 2006 and placed back on Coumadin, history  of COPD with fixed airflow obstruction, FEV-1 at 55% of predicted.  She had  been on Spiriva in 2006, but this has been stopped.  Weight has been up to  256 lbs. so she has been using her albuterol more frequently here and is  admitted now for further evaluation of rapid atrial fib.   PAST MEDICAL HISTORY/ MEDICAL HISTORY:  1. Pulmonary embolism x2 currently on lifelong Coumadin.  2. History of COPD, FEV-1 55% predicted on chronic albuterol use, did not      do well on Spiriva.  3. History of hysterectomy with bleeding.  4. History of morbid obesity.  5. History of depression.  6. Hypertension.   SOCIAL HISTORY:  Smoked a pack-a-day from age 50 to age 65, works in a  [employer], is married.   FAMILY HISTORY:  Noncontributory.   REVIEW OF SYSTEMS:  Noncontributory.   CURRENT MEDICATIONS:  Potassium, Coumadin, Benicar, Paxil, albuterol p.r.n.   PHYSICAL EXAMINATION:  GENERAL:  This is an obese female in no distress.  CHEST:  Showed distant breath sounds, poor airflow.  CARDIAC EXAM:  Showed an irregular rate and rhythm, resting tachycardia  without S3, normal S1-S2.  ABDOMEN:  Obese, bowel sounds active.  NEUROLOGIC:  Intact.  SKIN:  Clear.  HEENT EXAM:  No jugular venous distension, no  lymphadenopathy.   LABORATORY DATA:  All pending at time of this dictation.   IMPRESSION:  1. Fixed airflow obstruction with chronic obstruction lung disease, FEV-1      at 55% predicted, poor response to Spiriva.  2. Morbid obesity.  3. History of pulmonary embolism x2 now on chronic Coumadin.  4. Paroxysmal atrial fibrillation with rapid ventricular response.  5. Likely underlying reflux disease.   RECOMMENDATIONS:  1. Place on Protonix.  2. Check PFTs.  3. Check CTA of the chest to rule out PE.  4. Check room air blood gas.  5. Placed on scheduled Xopenex by nebulization.   Will follow with you.      Charlcie Cradle Delford Field, MD, [facility]  Electronically Signed     PEW/MEDQ  D:  09/14/2005  T:  09/14/2005  Job:  [id number]   cc:   Corwin Levins, MD

## 2010-05-29 NOTE — H&P (Signed)
[facility]  Patient:    [name] [name], [name] [name] Visit Number: [id number] MRN: [id number]          Service Type: Attending:  Corwin Levins, M.D. [facility] Dictated by:   [name] Parrett, N.P. Adm. Date:  11/22/00                           History and Physical  DATE OF BIRTH:  April 26, 1950  CHIEF COMPLAINT:  Shortness of breath and wheezing.  HISTORY OF PRESENT ILLNESS:  Ms. [name] [name] is a 60 year old, white, female patient of Corwin Levins, M.D., who he follows for primary care, including asthma in a current smoker.  She presented to the office for an acute work-in visit, experiencing severe shortness of breath and wheezing.  On arrival, her O2 saturation was 91%.  The patient states that the symptoms began approximately five days ago in which she saw Justine Null, M.D., on November 18, 2000, for a productive, wheezing, and increased dyspnea on exertion.  She was prescribed a Zithromax Tri-Pack, Medrol Dosepak, and Phenergan VC with codeine cough syrup.  She has had to increase her albuterol use up to three to four times a day.  She states that she only had mild improvement and woke up this morning with worsening in her condition.  She states that she has had some blood-tinged sputum over the last one to two days as well.  In the office today, the patient was given albuterol nebulizer x 2 and Depo-Medrol 120 IM injection without much improvement.  The patient was in mild to moderate distress.  It was felt that she needed admission for IV steroids, hand-held nebulizers, and antibiotic therapy.  She denied any leg swelling, orthopnea, PND, nausea, or vomiting.  She does complain of severe body aches and pleuritic chest pain, especially with coughing.  PAST MEDICAL HISTORY: 1. Asthmatic bronchitis in a current smoker of one pack per day. 2. DVT/pulmonary embolism in 1995 on chronic Coumadin therapy. 3. Hypertension. 4. Osteoporosis. 5. Depression. 6. Status post  hysterectomy. 7. Osteoarthritis.  CURRENT MEDICATIONS:  1. Aspirin 81 mg daily.  2. Lasix 20 mg daily.  3. Coumadin 10 mg daily.  4. Klor-Con 10 mEq daily.  5. Advair 500/50 mg one inhalation b.i.d.  6. Caltrate b.i.d.  7. Glucosamine 300 mg daily.  8. Nasonex one inhalation twice daily.  9. Allegra 180 mg daily. 10. Hyzaar 50/12.5 mg daily. 11. Actonel 5 mg daily. 12. Celebrex 200 mg daily. 13. Zoloft 100 mg b.i.d. 14. Risperdal 1 mg half q.a.m. and a whole tablet q.h.s. 15. Clorazepate 3.75 mg b.i.d. as needed for anxiety. 16. Albuterol one to two puffs q.4-6h. p.r.n. for wheezing.  DRUG ALLERGIES:  PENICILLIN.  SOCIAL HISTORY:   She is married.  She has three children.  She works at [employer].  She smokes one pack a day x 30 years.  Denies any alcohol.  FAMILY HISTORY:  Her mother died of heart disease.  Her father is deceased of questionable etiology.  A brother has throat cancer.  REVIEW OF SYSTEMS:  Essentially unremarkable, except as noted above.  PHYSICAL EXAMINATION:  GENERAL APPEARANCE:  The patient is in mild respiratory distress.  She alert and oriented x 3.  VITAL SIGNS:  Temperature 97.3 degrees, blood pressure 140/94, pulse regular at 74, O2 saturation 91% on room air.  HEENT:  The conjunctivae and sclerae are clear.  The nasal mucosa is normal. The posterior pharynx is  clear without any exudate or cobblestoning.  TMs are normal.  NECK:  Supple without adenopathy.  No JVD.  No thyromegaly.  The carotids are equal with positive upstrokes bilaterally.  LUNGS:  The lung sounds reveal diffuse inspiratory and expiratory wheezing. Decreased air movement bilaterally.  CARDIAC:  Regular rate and rhythm without murmurs, rubs, or gallops.  ABDOMEN:  Obese and soft without any hepatosplenomegaly.  No guarding or rebound noted.  EXTREMITIES:  Warm without any calf tenderness, clubbing, or cyanosis.  There is trace edema bilaterally.  NEUROLOGIC:  Alert and  oriented x 3.  Moves all extremities well.  IMPRESSION AND PLAN: 1. Acute asthmatic bronchitis exacerbation refractory to outpatient therapy.    Please admit to Corwin Levins, M.D., for IV antibiotics, steroids, and    hand-held nebulizer.  The chest x-ray and labs are currently pending.  Will    follow up accordingly. 2. Hypertension.  Currently controlled.  Will continue on her current regimen. 3. Depression.  Currently stable.  Will continue on her current regimen. 4. History of deep venous thrombosis and pulmonary embolism.  The patient is    to continue on Coumadin.  Pharmacy is to dose. Dictated by:   [name] Parrett, N.P. Attending:  Corwin Levins, M.D. [facility] DD:  11/22/00 TD:  11/22/00 Job: [id number]

## 2010-05-29 NOTE — H&P (Signed)
NAMEKRISTE, [name] [name] NO.:  [id number]   MEDICAL RECORD NO.:  [id number]                   PATIENT TYPE:  INP   LOCATION:  0105                                 FACILITY:  [facility]   PHYSICIAN:  Corwin Levins, M.D. [facility]             DATE OF BIRTH:  April 15, 1950   DATE OF ADMISSION:  06/21/2003  DATE OF DISCHARGE:                                HISTORY & PHYSICAL   CHIEF COMPLAINT:  Here with shortness of breath, worsening since last  evening.   HISTORY OF PRESENT ILLNESS:  Ms. Bath is a 60 year old white female here  for the above.  She has a long history of asthma and has seen Dr. Sherene Sires in  the fairly recent past.  Patient states that she has much difficulty  affording her medications and has had some noncompliance on several  occasions.  She states that she got the impression from Dr. Sherene Sires that she  did not need most of her medications anyway.  She is only taking currently  Protonix, QVAR, and Tylenol #3 for cough.  She has felt bad off and on since  her last visit to see me on March 19, 2003.  She was exposed to, apparently,  a child with a possible viral illness last week.  Now for the past 3-4 days,  she has had increasing low-grade temperature, fever, cough, and shortness of  breath culminating in some severe shortness of breath last evening.  She got  by with a nebulizer treatment with some outdated medication last night but  looks dyspneic and had some presyncopal episode on coming to the office  today.  She also has myalgias, chest discomfort as well.  She is markedly  tearful and depressed and overall is worried that her ongoing medical  problems, especially with this, will cause her to lose her job, as she has  had so much difficulty here recently.   PAST MEDICAL HISTORY:  1. Obesity.  2. Asthma.  3. Hypertension.  4. Glucose intolerance.  5. Anxiety/depression.  6. Osteoporosis.  7. Allergies.  8. History of DVT/pulmonary embolus.  9.  Anemia.  10.      Chronic asthmatic bronchitis.  11.      Hemorrhoids.   PAST SURGICAL HISTORY:  1. Status post T&A.  2. History of skin graft, middle finger, right hand.  3. History of D&C.  4. Hysterectomy in the 1990s.  5. Status post breast biopsy with fibrocystic breast disease, overall     negative.   ALLERGIES:  PENICILLIN.   CURRENT MEDICATIONS:  1. QVAR q.d.  2. Protonix 40 mg p.o. q.d.  3. Tylenol #3 on a p.r.n. basis.   In March, she had been taking baby aspirin 81 mg daily, Advair 250/100  b.i.d., Zoloft 100 mg p.o. q.d., lisinopril 20/12.5 1 p.o. q.d., and  Celebrex 200 mg daily.  She has taken Actonel 35 mg weekly  in the past but  gave it up six months ago due to cough.  She also had Singulair 10 mg q.d.  at her last visit that she was taking regularly.   SOCIAL HISTORY:  Tobacco none.  Alcohol none.  Married with three children.  Husband is with her today.  Still working in the [employer].   FAMILY HISTORY:  Significant for throat cancer and alcohol-related  cirrhosis.   REVIEW OF SYSTEMS:  Otherwise noncontributory.   PHYSICAL EXAMINATION:  VITAL SIGNS:  Blood pressure 150/100, respirations  20, pulse 88, temperature 96.7.  Weight 250.  She is morbidly obese for  height.  GENERAL:  This is a 60 year old white female.  Tearful.  Distressed.  Weak.  Ill-appearing.  Increased respirations.  Unclear whether it is related to  anxiety, stress, or shortness of breath, although she is markedly tight on  exam as well.  HEENT:  Sclerae are clear.  TM's with bilateral erythema.  Sinuses tender.  Pharynx with marked erythema.  NECK:  Bilateral submandibular lymphadenopathy.  LUNGS:  Markedly decreased breath sounds with a few wheezes.  HEART:  Regular rate and rhythm.  Somewhat distant.  ABDOMEN:  Soft and nontender.  Positive bowel sounds.  No organomegaly.  No  mass.  EXTREMITIES:  No edema.  NEUROLOGIC:  Cranial nerves II-XII intact, otherwise  nonfocal.   LABS:  None done prior to visit.   ASSESSMENT/PLAN:  1. Asthma exacerbation with upper respiratory infection.  Overall possibly     viral versus other.  She is to be admitted and given IV steroids, O2,     Rocephin, and pulmonary toilet.  Increase activity as tolerated.  May     need further medications and QVAR and Protonix, given her difficulty over     the past several months since changing to this.  2. Anxiety/depression:  Start Celexa 20 mg daily.  Hopefully a generic     medication will be less expensive.  3. Hypertension:  Restart the lisinopril/HCT 20/12.5 1 p.o. q.d.  4. Gastroesophageal reflux disease:  Continue the Protonix that she is     taking, although she might do as well with the Prilosec 20 mg OTC.  Will     forego restarting Actonel and Singulair at this time.  This may be     reconsidered in the future.                                               Corwin Levins, M.D. [facility]    JWJ/MEDQ  D:  06/21/2003  T:  06/21/2003  Job:  [id number]

## 2010-05-29 NOTE — Assessment & Plan Note (Signed)
[facility]                              CARDIOLOGY OFFICE NOTE   EBONIE, WESTERLUND                       MRN:          [id number]  DATE:10/06/2005                            DOB:          [birth year]    PRIMARY CARDIOLOGIST:  Jesse Sans. Wall, MD, [facility].   PRIMARY CARE PHYSICIAN:  Corwin Levins, MD.   PULMONOLOGIST:  Charlaine Dalton. Sherene Sires, MD, FCCP.   HISTORY OF PRESENT ILLNESS:  Ms. Heimann is a 60 year old female patient with  a history of DVT and pulmonary emboli, and a question of hypercoagulable  state, who was recently admitted with atrial fibrillation with rapid  ventricular rate.  She underwent TEE-guided cardioversion, which restored  sinus rhythm.  During her hospital stay, she had heart rates recorded as low  as in the 40s.  Apparently, she was seen by one of our electrophysiologists  and was not felt to be a candidate for permanent pacemaker implantation.  The patient returns to the office today for followup.  She has had a COPD  exacerbation that has been slow to recover.  She recently saw Dr. Jonny Ruiz, who  switched her to oral prednisone and doxycycline.  She is still coughing, but  there has been some slight improvement.  She continues to have atypical  chest pains.  She had one this past weekend that lasted for a few minutes.  It was a left-sided ache without radiation or associated nausea or  diaphoresis.  She denies any associated shortness of breath, although she is  chronically short of breath with her COPD and recent COPD exacerbation.  She  denies any syncope, but did have an episode of near-syncope this past  weekend.  She has had 3 episodes of this in the last couple of weeks.  This  happened at rest.  It lasted for a couple of minutes.  Of note, during her  recent hospitalization she had a chest CT that was negative for pulmonary  embolism.   CURRENT MEDICATIONS:  1. Coumadin as directed.  2. Benicar HCT daily.  3. Klor-Con 10  mEq daily.  4. Fluoxetine 20 mg daily.  5. Prednisone 10 mg daily for the next four days.  6. Xopenex q.8 hours.  7. Cardizem CD 360 mg daily.  8. Carvedilol 3.125 mg b.i.d.  9. Digoxin 250 mcg daily.  10.Protonix 40 mg daily.   ALLERGIES:  PENICILLIN.   SOCIAL HISTORY:  The patient quit smoking many years ago.   REVIEW OF SYSTEMS:  Please see HPI.  Denies any melena, hematochezia,  hematuria, dysuria.  She was febrile several days ago, but this has  resolved.  The rest of the review of systems are negative.   PHYSICAL EXAMINATION:  She is a well-developed, well-nourished female in no  distress.  Blood pressure 148/89, pulse 63, weight 246 pounds.  HEENT:  Unremarkable.  NECK:  Without JVD.  CARDIAC:  Normal S1, S2, regular rate and rhythm.  LUNGS:  Clear to auscultation bilaterally without wheezing, rhonchi or  rales.  ABDOMEN:  Soft, nontender, with normoactive bowel sounds, no organomegaly.  EXTREMITIES:  Without edema, calves are soft, nontender.  SKIN:  Warm and dry.  NEUROLOGIC:  She is alert and oriented x3.  Cranial nerves II-XII are  grossly intact.   Electrocardiogram reveals sinus rhythm with a heart rate of 66, normal axis,  inferolateral T wave inversions, more prominent than the previous tracings.   IMPRESSION:  1. Persistent atrial fibrillation with rapid ventricular rate.      a.     Maintaining sinus rhythm, status post transesophageal       echocardiography-guided cardioversion.  2. History of deep venous thrombosis and pulmonary emboli.      a.     Question of hypercoagulable state.  3. Chronic Coumadin therapy.  4. Asthma/chronic obstructive pulmonary disease.      a.     Recent chronic obstructive pulmonary disease exacerbation.  5. Atypical chest pain.  6. Gastroesophageal reflux disease/hiatal hernia.  7. Morbid obesity.  8. Depression.  9. Near-syncope.   PLAN:  The patient was also seen and examined by Dr. Daleen Squibb.  The patient has  had some  atypical chest pains, and we will set her up with an adenosine  Myoview to further evaluate this.  She did have a normal cardiac  catheterization in 2003.  She has had some episodes of near-syncope.  We  will go ahead and stop her digoxin.  We will check a BMET, CBC and dig.  level today.  We will also set her up with an event monitor to see if she is  having any significant  pauses causing her symptoms.  I will have her follow up with Dr. Daleen Squibb in the  next couple of weeks.   ADDENDUM:  It should be noted that the patient was inquiring about extending  her time off from work as well as disability.  It is our opinion that the  patient may return to work from a cardiac standpoint.  At this point in  time, there is no cardiac reason for her to have disability.  Should we find something on her event monitor that would dictate otherwise,  we will certainly update our recommendations.     ______________________________  Tereso Newcomer, PA-C    ______________________________  Jesse Sans. Daleen Squibb, MD, [facility]   Job #:  [id number]   SW/MedQ  DD:  10/06/2005  DT:  10/08/2005  Job #:  [id number]   cc:   Corwin Levins, MD  Charlaine Dalton. Sherene Sires, MD, FCCP

## 2010-06-17 ENCOUNTER — Ambulatory Visit (INDEPENDENT_AMBULATORY_CARE_PROVIDER_SITE_OTHER): Payer: BC Managed Care – PPO | Admitting: Emergency Medicine

## 2010-06-17 DIAGNOSIS — I4891 Unspecified atrial fibrillation: Secondary | ICD-10-CM

## 2010-06-17 DIAGNOSIS — Z86718 Personal history of other venous thrombosis and embolism: Secondary | ICD-10-CM

## 2010-06-17 DIAGNOSIS — I4892 Unspecified atrial flutter: Secondary | ICD-10-CM

## 2010-06-17 DIAGNOSIS — Z8672 Personal history of thrombophlebitis: Secondary | ICD-10-CM

## 2010-06-17 LAB — POCT INR: INR: 2.5

## 2010-06-29 ENCOUNTER — Other Ambulatory Visit: Payer: Self-pay | Admitting: Obstetrics and Gynecology

## 2010-06-29 DIAGNOSIS — Z1231 Encounter for screening mammogram for malignant neoplasm of breast: Secondary | ICD-10-CM

## 2010-07-22 ENCOUNTER — Ambulatory Visit (INDEPENDENT_AMBULATORY_CARE_PROVIDER_SITE_OTHER): Payer: BC Managed Care – PPO | Admitting: Emergency Medicine

## 2010-07-22 DIAGNOSIS — I4891 Unspecified atrial fibrillation: Secondary | ICD-10-CM

## 2010-07-22 DIAGNOSIS — Z8672 Personal history of thrombophlebitis: Secondary | ICD-10-CM

## 2010-07-22 DIAGNOSIS — I4892 Unspecified atrial flutter: Secondary | ICD-10-CM

## 2010-07-22 DIAGNOSIS — Z86718 Personal history of other venous thrombosis and embolism: Secondary | ICD-10-CM

## 2010-07-22 LAB — POCT INR: INR: 2.4

## 2010-08-10 ENCOUNTER — Ambulatory Visit
Admission: RE | Admit: 2010-08-10 | Discharge: 2010-08-10 | Disposition: A | Discharge status: Home / Self Care | Payer: BC Managed Care – PPO | Source: Ambulatory Visit | Attending: Obstetrics and Gynecology | Admitting: Obstetrics and Gynecology

## 2010-08-10 DIAGNOSIS — Z1231 Encounter for screening mammogram for malignant neoplasm of breast: Secondary | ICD-10-CM

## 2010-08-12 ENCOUNTER — Ambulatory Visit (INDEPENDENT_AMBULATORY_CARE_PROVIDER_SITE_OTHER): Payer: BC Managed Care – PPO | Admitting: Internal Medicine

## 2010-08-12 ENCOUNTER — Encounter: Payer: Self-pay | Admitting: Internal Medicine

## 2010-08-12 VITALS — BP 118/76 | HR 84 | Temp 98.3°F | Ht 64.0 in | Wt 271.4 lb

## 2010-08-12 DIAGNOSIS — J441 Chronic obstructive pulmonary disease with (acute) exacerbation: Secondary | ICD-10-CM

## 2010-08-12 LAB — HM MAMMOGRAPHY: HM Mammogram: NEGATIVE

## 2010-08-12 MED ORDER — DOXYCYCLINE HYCLATE 100 MG PO TABS: 100.0000 mg | ORAL_TABLET | Freq: Two times a day (BID) | ORAL | Status: DC

## 2010-08-12 MED ORDER — PREDNISONE 10 MG PO TABS: ORAL_TABLET | ORAL | Status: DC

## 2010-08-12 NOTE — Assessment & Plan Note (Signed)
She wants to avoid admission. AE COPD with sinusitis appears to be mild-moderate and not waranting admissiion.  Plan Doxy x 7 days Prednisone - short course (risks explained, she accepts) ROV with [name] - needs med calendar

## 2010-08-12 NOTE — Progress Notes (Signed)
Subjective:    Patient ID: [name] [name], female    DOB: Feb 17, 1950, 60 y.o.   MRN: [id number]  HPI  Known COPD NOS, on home o2, symbicort (subjectively did not feel she responded to spiriva and not on it). Also A fib and Diast CHF and prior PE  OV 08/12/2010: Acute visit. Dr Blase Mess patient. Last 5 days new acute onset, acute sinus congestion with clear drainage. Progressive. Rates it as moderate. Associated worsening of baseline dyspnea, chest tightness and clearing of throat present. Denies sputum, edema, chest pain, hemoptysis, fever, chills. No other symptoms. Not seen Dr Sherene Sires in a while. On symbicort.   Past Medical History  Diagnosis Date  . Chronic rhinitis   . Edema   . Acute and chronic respiratory failure   . Morbid obesity     target wt=179lb for BMI<30 Peak wt 282lb  . DVT (deep venous thrombosis)   . COPD (chronic obstructive pulmonary disease)     Wert. PFTs 07/25/06 FEV1 55% ratio 53, DLC0 51% HFA 50% 12/16/2009  . Depression   . Osteoporosis     last BMD 4/08 -2.4, intolerant of bisphosphonates  . Hypertension   . Arrhythmia     atrial fibrillation, anticoagulation therapy  . Pulmonary embolism   . Anxiety   . Glucose intolerance (impaired glucose tolerance)     on steroids  . Hyperlipidemia   . VITAMIN D DEFICIENCY 10/27/2007  . HYPERLIPIDEMIA 06/19/2007  . PREMATURE VENTRICULAR CONTRACTIONS 10/23/2008  . Atrial flutter 07/01/2008  . Obstructive chronic bronchitis with exacerbation 01/19/2007  . PNEUMONIA ORGANISM NOS 01/09/2008  . UTI 08/06/2008  . WRIST PAIN, LEFT 11/27/2009  . ANKLE PAIN 03/28/2008  . Rhabdomyolysis 07/29/2009  . CRAMP IN LIMB 07/29/2009  . OSTEOPOROSIS 07/29/2006  . CHEST PAIN 09/16/2009  . Dysuria 07/22/2008  . FRACTURE, RIB, RIGHT 06/18/2009  . PULMONARY EMBOLISM, HX OF 06/19/2007  . DEEP VENOUS THROMBOPHLEBITIS, HX OF 07/29/2006  . DYSPNEA 03/10/2010     Family History  Problem Relation Age of Onset  . Heart disease Mother   . Asthma Brother     . Throat cancer Brother   . Asthma Son      History   Social History  . Marital Status: Married    Spouse Name: N/A    Number of Children: 3  . Years of Education: N/A   Occupational History  . 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 Smoking status: Former Smoker -- 1.0 packs/day for 35 years    Types: Cigarettes    Quit date: 01/12/1996  . Smokeless tobacco: Not on file  . Alcohol Use: No  . Drug Use: No  . Sexually Active: Not on file   Other Topics Concern  . Not on file   Social History Narrative  . No narrative on file     Allergies  Allergen Reactions  . Duloxetine     REACTION: rhabdomyolysis  . Penicillins      Outpatient Prescriptions Prior to Visit  Medication Sig Dispense Refill  . acetaminophen (TYLENOL ARTHRITIS PAIN) 650 MG CR tablet Take 650 mg by mouth every 8 (eight) hours as needed.        Marland Kitchen amLODipine-valsartan (EXFORGE) 10-320 MG per tablet Take 1 tablet by mouth daily.        Marland Kitchen atorvastatin (LIPITOR) 10 MG tablet Take 1 tablet (10 mg total) by mouth daily.  90 tablet  3  . budesonide-formoterol (SYMBICORT) 160-4.5 MCG/ACT  inhaler Inhale 2 puffs into the lungs 2 (two) times daily.        . Calcium Carbonate-Vitamin D 600-400 MG-UNIT per tablet Take 1 tablet by mouth 2 (two) times daily.        . chlorpheniramine (CHLOR-TRIMETON) 4 MG tablet Take 4 mg by mouth every 4 (four) hours as needed.        . cholecalciferol (VITAMIN D) 1000 UNITS tablet Take 1,000 Units by mouth daily.        Marland Kitchen denosumab (PROLIA) 60 MG/ML SOLN Inject 60 mg into the skin once. Inject once every 6 months       . dextromethorphan-guaifenesin (MUCINEX DM) 30-600 MG per 12 hr tablet Take 1 tablet by mouth. 1 every 12 hours as needed w/flutter       . FLUoxetine (PROZAC) 40 MG capsule TAKE 1 CAPSULE DAILY  90 capsule  2  . furosemide (LASIX) 40 MG tablet Take 40 mg by mouth. Take 1 table by mouth once a day and 1 extra once daily as needed        . glucosamine-chondroitin 500-400 MG tablet Take 1 tablet by mouth daily.        Marland Kitchen levalbuterol (XOPENEX HFA) 45 MCG/ACT inhaler Inhale 1-2 puffs into the lungs every 4 (four) hours as needed.        . levalbuterol (XOPENEX) 1.25 MG/3ML nebulizer solution Take 1 ampule by nebulization every 4 (four) hours as needed.        . mometasone (NASONEX) 50 MCG/ACT nasal spray 2 sprays by Nasal route 2 (two) times daily.        Marland Kitchen omeprazole (PRILOSEC) 20 MG capsule TAKE 1 CAPSULE 30 MINUTES BEFORE FIRST MEAL  90 capsule  3  . oxymetazoline (AFRIN) 0.05 % nasal spray 2 sprays by Nasal route 2 (two) times daily. For 5 days before nasonex       . potassium chloride (KLOR-CON 10) 10 MEQ CR tablet Take 10 mEq by mouth 2 (two) times daily.        . propafenone (RYTHMOL SR) 325 MG 12 hr capsule Take 1 capsule (325 mg total) by mouth 2 (two) times daily.  180 capsule  3  . Saline (SODIUM CHLORIDE) 0.65 % SOLN by Nasal route. 2 puffs every 4 hours as needed       . traMADol (ULTRAM) 50 MG tablet Take 50 mg by mouth every 4 (four) hours as needed. For cough       . warfarin (COUMADIN) 7.5 MG tablet Take 1 tablet (7.5 mg total) by mouth as directed. Take as directed by coumadin clinic  120 tablet  1  . levofloxacin (LEVAQUIN) 750 MG tablet Take 750 mg by mouth. One tablet by mouth daily for 5 days       . tiotropium (SPIRIVA) 18 MCG inhalation capsule Place 18 mcg into inhaler and inhale. Inhale contents of 1 capsule once a day           Review of Systems  Constitutional: Positive for chills. Negative for fever and unexpected weight change.  HENT: Positive for ear pain, congestion, trouble swallowing and postnasal drip. Negative for nosebleeds, sore throat, rhinorrhea, sneezing, dental problem and sinus pressure.   Eyes: Negative for redness and itching.  Respiratory: Positive for cough, chest tightness, shortness of breath and wheezing.   Cardiovascular: Negative for palpitations and leg swelling.   Gastrointestinal: Negative for nausea and vomiting.  Genitourinary: Negative for dysuria.  Musculoskeletal: Negative for joint swelling.  Skin:  Negative for rash.  Neurological: Negative for headaches.  Hematological: Does not bruise/bleed easily.  Psychiatric/Behavioral: Negative for dysphoric mood. The patient is not nervous/anxious.        Objective:   Physical Exam  Vitals reviewed. Constitutional: She is oriented to person, place, and time. She appears well-developed and well-nourished. No distress.       Obese. No distress  HENT:  Head: Normocephalic and atraumatic.  Right Ear: External ear normal.  Left Ear: External ear normal.  Mouth/Throat: Oropharynx is clear and moist. No oropharyngeal exudate.  Eyes: Conjunctivae and EOM are normal. Pupils are equal, round, and reactive to light. Right eye exhibits no discharge. Left eye exhibits no discharge. No scleral icterus.  Neck: Normal range of motion. Neck supple. No JVD present. No tracheal deviation present. No thyromegaly present.       mallampatti class 4  Cardiovascular: Normal rate, regular rhythm, normal heart sounds and intact distal pulses.  Exam reveals no gallop and no friction rub.   No murmur heard. Pulmonary/Chest: Effort normal and breath sounds normal. No respiratory distress. She has no wheezes. She has no rales. She exhibits no tenderness.  Abdominal: Soft. Bowel sounds are normal. She exhibits no distension and no mass. There is no tenderness. There is no rebound and no guarding.  Musculoskeletal: Normal range of motion. She exhibits no edema and no tenderness.  Lymphadenopathy:    She has no cervical adenopathy.  Neurological: She is alert and oriented to person, place, and time. She has normal reflexes. No cranial nerve deficit. She exhibits normal muscle tone. Coordination normal.  Skin: Skin is warm and dry. No rash noted. She is not diaphoretic. No erythema. No pallor.  Psychiatric: She has a normal mood  and affect. Her behavior is normal. Judgment and thought content normal.          Assessment & Plan:

## 2010-08-12 NOTE — Patient Instructions (Addendum)
Please take doxycycyline 100mg  twice daily after meals x 7 days Please take prednisone 40mg  daily x 2 days, then 30mg  daily x 2 days, 20mg  daily x 2 days, 10mg  daily x 2 days, then 5mg  daily x 2 days Please go to ER or call us 24/7 if not improving See Dr. Sherene Sires or [name] next 2 weeks - do med calendar

## 2010-08-12 NOTE — Assessment & Plan Note (Deleted)
She wants to avoid admission. AE COPD with sinusitis appears to be mild-moderate and not waranting admissiion.  Plan Doxy x 7 days Prednisone - short course (risks explained, she accepts) ROV with [name] - needs med calendar  

## 2010-08-19 ENCOUNTER — Ambulatory Visit (INDEPENDENT_AMBULATORY_CARE_PROVIDER_SITE_OTHER): Payer: BC Managed Care – PPO | Admitting: Emergency Medicine

## 2010-08-19 DIAGNOSIS — Z8672 Personal history of thrombophlebitis: Secondary | ICD-10-CM

## 2010-08-19 DIAGNOSIS — Z86718 Personal history of other venous thrombosis and embolism: Secondary | ICD-10-CM

## 2010-08-19 DIAGNOSIS — I4892 Unspecified atrial flutter: Secondary | ICD-10-CM

## 2010-08-19 DIAGNOSIS — I4891 Unspecified atrial fibrillation: Secondary | ICD-10-CM

## 2010-08-19 LAB — POCT INR: INR: 2.6

## 2010-08-26 ENCOUNTER — Encounter: Payer: Self-pay | Admitting: Adult Health

## 2010-08-26 ENCOUNTER — Ambulatory Visit (INDEPENDENT_AMBULATORY_CARE_PROVIDER_SITE_OTHER): Payer: BC Managed Care – PPO | Admitting: Adult Health

## 2010-08-26 DIAGNOSIS — E785 Hyperlipidemia, unspecified: Secondary | ICD-10-CM

## 2010-08-26 DIAGNOSIS — J310 Chronic rhinitis: Secondary | ICD-10-CM

## 2010-08-26 DIAGNOSIS — J449 Chronic obstructive pulmonary disease, unspecified: Secondary | ICD-10-CM

## 2010-08-26 MED ORDER — OXYMETAZOLINE HCL 0.05 % NA SOLN: 2.0000 | Freq: Two times a day (BID) | NASAL | Status: DC | PRN

## 2010-08-26 MED ORDER — DM-GUAIFENESIN ER 30-600 MG PO TB12: ORAL_TABLET | ORAL | Status: DC

## 2010-08-26 MED ORDER — ATORVASTATIN CALCIUM 10 MG PO TABS: 10.0000 mg | ORAL_TABLET | Freq: Every day | ORAL | Status: DC

## 2010-08-26 MED ORDER — MOMETASONE FUROATE 50 MCG/ACT NA SUSP: 1.0000 | Freq: Two times a day (BID) | NASAL | Status: DC

## 2010-08-26 MED ORDER — SALINE SPRAY 0.65 % NA SOLN: NASAL | Status: DC

## 2010-08-26 MED ORDER — CETIRIZINE HCL 10 MG PO CAPS: 1.0000 | ORAL_CAPSULE | Freq: Every day | ORAL | Status: DC

## 2010-08-26 MED ORDER — ACETAMINOPHEN ER 650 MG PO TBCR: EXTENDED_RELEASE_TABLET | ORAL | Status: DC

## 2010-08-26 MED ORDER — POTASSIUM CHLORIDE CRYS ER 20 MEQ PO TBCR: 20.0000 meq | EXTENDED_RELEASE_TABLET | Freq: Two times a day (BID) | ORAL | Status: DC

## 2010-08-26 MED ORDER — LEVALBUTEROL TARTRATE 45 MCG/ACT IN AERO: 2.0000 | INHALATION_SPRAY | RESPIRATORY_TRACT | Status: DC | PRN

## 2010-08-26 MED ORDER — FUROSEMIDE 80 MG PO TABS: ORAL_TABLET | ORAL | Status: DC

## 2010-08-26 NOTE — Progress Notes (Signed)
Subjective:    Patient ID: [name] [name], female    DOB: 1950/06/04, 60 y.o.   MRN: [id number]  HPI 60 yowf last smoked 2003 with COPD with minimum asthmatic component with an FEV1 of 55%, a ratio of 53%, and a diffusing capacity 51% documented 07/25/06. On 02 24 hours per day at 2lpm  6/09 weighed 271 and it was felt she was as debilitated as much by obesity as by airflow obstruction.  baseline = when well = walk to mother in law's house but stops at least once because it's uphill and frequently requiring rescue rx.   March 01, 2008--post hospitalization follow up. Admitted 02/23/08 for post op respiratory difficulties. she underwent left Wrist surgery, post op dyspnea, cough/wheezing. Admitted w/ aggressive resp toilet and IV steroids. Discharged. 02/27/08 improved.  June 20, 2008 ov post hosp for [facility] req d/c cardioversion but no change in rx, walking 30 min daily on 02 c/o increaese cough day = night, dry. wt = 265   March 05, 2010-ov c/o increased SOB with rest and activity x 4-5 days with associated wheezing. . Ankles more swollen. than usual. bnp 292  rec Prednisone taper over next week.  Extra Furosemide 40mg  once daily x 3days  Low salt diet  Legs elevated.    March 10, 2010 ov Dyspnea- worse pulse low, legs swelling no better p prednisone and increasing lasix. L post cp off and on x 3 days but not pleuritic, no cough, no nausea.>> Admitted   March 19, 2010-- Post hospital visit. Admitted 2/28-03/16/10( dc wt at 269 which was about 10 lbs down) for Acute on chronic resp failure secondary to decompensated diastolic heart failure . She was tx with aggressive diuresis. Conintued on coumadin. INR yesterday was 2.4. 2 D echo showed EF 55% w/ gr 2 diastolic dysfunction . VQ scan with low probability for PE. Since discharge some better but still real weak. Cough and congestion are some better. Edema worse this am, took with lasix that helped. no change in rx   March 27, 2010 ov wt no  change from discharge though she feels more swollen, no chest pain, doing wee but not sob with ex. very very poor insight into wt and fluid issues. Was on lasix 40 with option for extra as needed and wasn't taking it so cards increased to 80 daily.   08/26/2010 Follow up and med review.  Pt returns for follow up and med review. We reviewed all her meds and organized them into a med calendar with pt edcuation. She has had a few changes over last months. She has done well over last 6 months with no flare until 2 weeks ago. Seen in the pulmonary clinic with bronchitis , Tx with doxycycline and steroid taper. She is feeling much better but still has some drainage in throat.  No discolored mucus or fever.    Past Medical History:  CHRONIC RHINITIS (ICD-472.0)  EDEMA (ICD-782.3)  ACUTE AND CHRONIC RESPIRATORY FAILURE (ICD-518.84)  MORBID OBESITY (ICD-278.01)  - Target wt = 179 for BMI < 30 peak wt 282  - Referred back to nutrition December 18, 2007  DEEP VENOUS THROMBOPHLEBITIS, HX OF (ICD-V12.52)  COPD (ICD-496)..........................................................Marland KitchenWert  - PFTs 07/25/06 FEV1 55% ratio 53, DLC0 51%  - HFA 50% December 16, 2009 > 75% March 27, 2010  DEPRESSION (ICD-311)  OSTEOPOROSIS (ICD-733.00) last BMD 4/08 -2.4, intolerant of bisphosphonates  HYPERTENSION (ICD-401.9)  Atrial fibrillation - paroxysmal  Anticoagulation therapy  Pulmonary embolism, hx of  neg stress myoview 10/07  Anxiety  glucose intolerance - on steroids  Hyperlipidemia  Complex medical regimen  - Med calendar done 05/01/2008, 08/26/2010    Review of Systems Constitutional:   No  weight loss, night sweats,  Fevers, chills, fatigue, or  lassitude.  HEENT:   No headaches,  Difficulty swallowing,  Tooth/dental problems, or  Sore throat,                No sneezing, itching, ear ache, nasal congestion, post nasal drip,   CV:  No chest pain,  Orthopnea, PND, swelling in lower extremities, anasarca,  dizziness, palpitations, syncope.   GI  No heartburn, indigestion, abdominal pain, nausea, vomiting, diarrhea, change in bowel habits, loss of appetite, bloody stools.   Resp:    No coughing up of blood.    No chest wall deformity  Skin: no rash or lesions.  GU: no dysuria, change in color of urine, no urgency or frequency.  No flank pain, no hematuria   MS:  No joint pain or swelling.  No decreased range of motion.    Psych:  No change in mood or affect. No depression or anxiety.             Objective:   Physical Exam GEN: A/Ox3; pleasant , NAD, obese   HEENT:  /AT,  EACs-clear, TMs-wnl, NOSE-clear drainage, THROAT-clear, no lesions, no postnasal drip or exudate noted.   NECK:  Supple w/ fair ROM; no JVD; normal carotid impulses w/o bruits; no thyromegaly or nodules palpated; no lymphadenopathy.  RESP  Clear  P & A; w/o, wheezes/ rales/ or rhonchi.no accessory muscle use, no dullness to percussion  CARD:  RRR, no m/r/g  , tr peripheral edema, pulses intact, no cyanosis or clubbing.  GI:   Soft & nt; nml bowel sounds; no organomegaly or masses detected.  Musco: Warm bil, no deformities or joint swelling noted.   Neuro: alert, no focal deficits noted.    Skin: Warm, no lesions or rashes         Assessment & Plan:

## 2010-08-26 NOTE — Progress Notes (Signed)
Addended by: Boone Master E on: 08/26/2010 01:29 PM   Modules accepted: Orders

## 2010-08-26 NOTE — Patient Instructions (Signed)
Follow  med calendar closely and bring to each visit.  Follow up Dr. Sherene Sires  In 6 weeks  Add Zyrtec 10mg  At bedtime   May use Chlor trimeton in addition if needed if still has symptoms of drainage, drippy nose or throat clearing.

## 2010-08-26 NOTE — Assessment & Plan Note (Signed)
Recent flare now resolved  Cont on current regimen   Patient's medications were reviewed today and patient education was given. Computerized medication calendar was adjusted/completed   

## 2010-08-26 NOTE — Assessment & Plan Note (Signed)
Add zyrtec daily  With As needed  Chlor tabs

## 2010-09-01 ENCOUNTER — Encounter: Payer: Self-pay | Admitting: Internal Medicine

## 2010-09-01 ENCOUNTER — Ambulatory Visit (HOSPITAL_COMMUNITY)
Admission: RE | Admit: 2010-09-01 | Discharge: 2010-09-01 | Disposition: A | Discharge status: Home / Self Care | Payer: BC Managed Care – PPO | Source: Ambulatory Visit | Attending: Internal Medicine | Admitting: Internal Medicine

## 2010-09-01 ENCOUNTER — Ambulatory Visit (INDEPENDENT_AMBULATORY_CARE_PROVIDER_SITE_OTHER): Payer: BC Managed Care – PPO | Admitting: Internal Medicine

## 2010-09-01 ENCOUNTER — Telehealth: Payer: Self-pay

## 2010-09-01 VITALS — BP 112/68 | HR 82 | Temp 98.8°F | Ht 66.0 in | Wt 273.6 lb

## 2010-09-01 DIAGNOSIS — M79609 Pain in unspecified limb: Secondary | ICD-10-CM

## 2010-09-01 DIAGNOSIS — J449 Chronic obstructive pulmonary disease, unspecified: Secondary | ICD-10-CM

## 2010-09-01 DIAGNOSIS — M79604 Pain in right leg: Secondary | ICD-10-CM | POA: Insufficient documentation

## 2010-09-01 DIAGNOSIS — I10 Essential (primary) hypertension: Secondary | ICD-10-CM

## 2010-09-01 MED ORDER — OXYCODONE HCL 5 MG PO TABS: ORAL_TABLET | ORAL | Status: DC

## 2010-09-01 NOTE — Patient Instructions (Signed)
Take all new medications as prescribed Please see PCC's now for Stat right leg venous doppler Continue all other medications as before If the doppler is positive, you will need to go to the ER If the doppler is negative, you will need to have MRI of the lower back

## 2010-09-01 NOTE — Assessment & Plan Note (Signed)
Mod to severe, for pain control, but given her dvt/pe hx will need venous dopplers urgently but if neg would pursue LS spine MRI, consdier NS referral

## 2010-09-01 NOTE — Assessment & Plan Note (Signed)
stable overall by hx and exam, most recent data reviewed with pt, and pt to continue medical treatment as before  SpO2 Readings from Last 3 Encounters:  09/01/10 95%  08/26/10 98%  08/12/10 95%

## 2010-09-01 NOTE — Assessment & Plan Note (Signed)
stable overall by hx and exam, most recent data reviewed with pt, and pt to continue medical treatment as before  BP Readings from Last 3 Encounters:  09/01/10 112/68  08/26/10 118/78  08/12/10 118/76

## 2010-09-01 NOTE — Telephone Encounter (Signed)
Called the patient, she is having leg weakness, feeling like she may fall. She has had blood clots in her legs in the past and  does feel similar. She stated she does have a cane and would use. Informed to schedule appointment today, she agreed and is coming today at 2:45 to see Dr. Jonny Ruiz

## 2010-09-01 NOTE — Telephone Encounter (Signed)
Message copied by Pincus Sanes on Tue Sep 01, 2010 11:08 AM ------      Message from: Anselm Jungling      Created: Tue Sep 01, 2010 11:00 AM       Please call, thanks!

## 2010-09-01 NOTE — Progress Notes (Signed)
Subjective:    Patient ID: [name] [name], female    DOB: [birth year], 60 y.o.   MRN: [id number]  HPI  Here with unsual marked increase in pain and weakness and numbness/burning/tingling in the foot sinceyesterday, mod to severe, feels as if she might fall, uses a cane today (normally does not walk with cane),  Had someone else drive her here today.  Pt denies recurring LBP with the new leg issue but no bowel or bladder change, fever, wt loss.  Was doing house work yest with bending but did not have pain worse to the lower back then. RLE diffuse swollen.  Has hx of mult DVT/PE, as well as CHF but LLE not worse than usual.   Pt denies chest pain, increased sob or doe, wheezing, orthopnea, PND,  palpitations, dizziness or syncope. Pt denies new neurological symptoms such as new headache, or facial or extremity weakness or numbness except for the above.   Pt denies polydipsia, polyuria. Lasat INR .6 on Aug 8..  Pt denies fever, wt loss, night sweats, loss of appetite, or other constitutional symptoms Past Medical History  Diagnosis Date  . Chronic rhinitis   . Edema   . Acute and chronic respiratory failure   . Morbid obesity     target wt=179lb for BMI<30 Peak wt 282lb  . DVT (deep venous thrombosis)   . COPD (chronic obstructive pulmonary disease)     Wert. PFTs 07/25/06 FEV1 55% ratio 53, DLC0 51% HFA 50% 12/16/2009  . Depression   . Osteoporosis     last BMD 4/08 -2.4, intolerant of bisphosphonates  . Hypertension   . Arrhythmia     atrial fibrillation, anticoagulation therapy  . Pulmonary embolism   . Anxiety   . Glucose intolerance (impaired glucose tolerance)     on steroids  . Hyperlipidemia   . VITAMIN D DEFICIENCY 10/27/2007  . HYPERLIPIDEMIA 06/19/2007  . PREMATURE VENTRICULAR CONTRACTIONS 10/23/2008  . Atrial flutter 07/01/2008  . Obstructive chronic bronchitis with exacerbation 01/19/2007  . PNEUMONIA ORGANISM NOS 01/09/2008  . UTI 08/06/2008  . WRIST PAIN, LEFT 11/27/2009  .  ANKLE PAIN 03/28/2008  . Rhabdomyolysis 07/29/2009  . CRAMP IN LIMB 07/29/2009  . OSTEOPOROSIS 07/29/2006  . CHEST PAIN 09/16/2009  . Dysuria 07/22/2008  . FRACTURE, RIB, RIGHT 06/18/2009  . PULMONARY EMBOLISM, HX OF 06/19/2007  . DEEP VENOUS THROMBOPHLEBITIS, HX OF 07/29/2006  . DYSPNEA 03/10/2010   Past Surgical History  Procedure Date  . Abdominal hysterectomy   . Tonsillectomy   . Skin graft to middle r finger 1975  . S/p left arm fracture with fall off chair     reports that she quit smoking about 14 years ago. Her smoking use included Cigarettes. She has a 35 pack-year smoking history. She does not have any smokeless tobacco history on file. She reports that she does not drink alcohol or use illicit drugs. family history includes Asthma in her brother and son; Heart disease in her mother; and Throat cancer in her brother. Allergies  Allergen Reactions  . Duloxetine     REACTION: rhabdomyolysis  . Penicillins    Current Outpatient Prescriptions on File Prior to Visit  Medication Sig Dispense Refill  . acetaminophen (TYLENOL ARTHRITIS PAIN) 650 MG CR tablet Take as directed when needed for pain      . amLODipine-valsartan (EXFORGE) 10-320 MG per tablet Take 1 tablet by mouth daily.        Marland Kitchen atorvastatin (LIPITOR) 10 MG tablet Take 1 tablet (  10 mg total) by mouth at bedtime.      . budesonide-formoterol (SYMBICORT) 160-4.5 MCG/ACT inhaler Inhale 2 puffs into the lungs 2 (two) times daily.        . Calcium Carbonate-Vitamin D 600-400 MG-UNIT per tablet Take 1 tablet by mouth 2 (two) times daily.        . Cetirizine HCl (ZYRTEC ALLERGY) 10 MG CAPS Take 1 capsule (10 mg total) by mouth at bedtime.      . chlorpheniramine (CHLOR-TRIMETON) 4 MG tablet Take 4 mg by mouth every 4 (four) hours as needed.        . cholecalciferol (VITAMIN D) 1000 UNITS tablet Take 1,000 Units by mouth daily.        Marland Kitchen denosumab (PROLIA) 60 MG/ML SOLN Inject 60 mg into the skin once. Inject once every 6 months         . dextromethorphan-guaiFENesin (MUCINEX DM) 30-600 MG per 12 hr tablet 1 every 12 hours as needed w/flutter for cough/congestion      . FLUoxetine (PROZAC) 40 MG capsule TAKE 1 CAPSULE DAILY  90 capsule  2  . furosemide (LASIX) 80 MG tablet Take 1 tablet by mouth once a day and may add 1/2 extra once daily as needed      . glucosamine-chondroitin 500-400 MG tablet Take 1 tablet by mouth daily.        Marland Kitchen levalbuterol (XOPENEX HFA) 45 MCG/ACT inhaler Inhale 2 puffs into the lungs every 4 (four) hours as needed for wheezing or shortness of breath.      . levalbuterol (XOPENEX) 1.25 MG/3ML nebulizer solution Take 1 ampule by nebulization every 4 (four) hours as needed.        . mometasone (NASONEX) 50 MCG/ACT nasal spray Place 1 spray into the nose 2 (two) times daily.      Marland Kitchen omeprazole (PRILOSEC) 20 MG capsule TAKE 1 CAPSULE 30 MINUTES BEFORE FIRST MEAL  90 capsule  3  . oxymetazoline (AFRIN) 0.05 % nasal spray Place 2 sprays into the nose 2 (two) times daily as needed for congestion. For 5 days before nasonex      . potassium chloride SA (K-DUR,KLOR-CON) 20 MEQ tablet Take 1 tablet (20 mEq total) by mouth 2 (two) times daily.      . propafenone (RYTHMOL SR) 325 MG 12 hr capsule Take 1 capsule (325 mg total) by mouth 2 (two) times daily.  180 capsule  3  . sodium chloride (OCEAN) 0.65 % SOLN nasal spray 2 puffs every 4 hours as needed for nasal congestion      . traMADol (ULTRAM) 50 MG tablet Take 50 mg by mouth every 4 (four) hours as needed. For cough       . warfarin (COUMADIN) 7.5 MG tablet Take 1 tablet (7.5 mg total) by mouth as directed. Take as directed by coumadin clinic  120 tablet  1   Review of Systems Review of Systems  Constitutional: Negative for diaphoresis and unexpected weight change.  HENT: Negative for drooling and tinnitus.   Eyes: Negative for photophobia and visual disturbance.  Respiratory: Negative for choking and stridor.   Gastrointestinal: Negative for vomiting and  blood in stool.  Genitourinary: Negative for hematuria and decreased urine volume.      Objective:   Physical Exam BP 112/68  Pulse 82  Temp(Src) 98.8 F (37.1 C) (Oral)  Ht 5\' 6"  (1.676 m)  Wt 273 lb 9.6 oz (124.104 kg)  BMI 44.16 kg/m2  SpO2 95% Physical Exam  VS noted, nontoxic but uncomfortable, in pain, uses cane to support RLE Constitutional: Pt appears well-developed and well-nourished.  HENT: Head: Normocephalic.  Right Ear: External ear normal.  Left Ear: External ear normal.  Eyes: Conjunctivae and EOM are normal. Pupils are equal, round, and reactive to light.  Neck: Normal range of motion. Neck supple.  Cardiovascular: Normal rate and regular rhythm.   Pulmonary/Chest: Effort normal and breath sounds decreased bilat.  Abd:  Soft, NT, non-distended, + BS Neurological: Pt is alert. No cranial nerve deficit. RLE 4+/5 distal strength Skin: Skin is warm. No erythema. but 2-3+ edema to right mid calf, LLE with 1+ edema to above ankle only Psychiatric: Pt behavior is normal. Thought content normal. 1+ nervous Spine nontender; gait antalgic, favoring RLE      Assessment & Plan:

## 2010-09-02 ENCOUNTER — Telehealth: Payer: Self-pay | Admitting: Internal Medicine

## 2010-09-02 DIAGNOSIS — M5416 Radiculopathy, lumbar region: Secondary | ICD-10-CM | POA: Insufficient documentation

## 2010-09-02 NOTE — Telephone Encounter (Signed)
Called the patient informed of results. The patient does not have a neurosurgeon or ortho she has seen before.

## 2010-09-02 NOTE — Telephone Encounter (Signed)
Both MRi and NS referrals done

## 2010-09-02 NOTE — Telephone Encounter (Signed)
Received prelim report regarding neg for DVT  On RLE venous doppler aug 21  OK for MRI LS Spine  Does pt have neurosurgury or ortho she has seen in the past?  Dahlia to notify pt, and ask about surgury referral please

## 2010-09-07 ENCOUNTER — Ambulatory Visit
Admission: RE | Admit: 2010-09-07 | Discharge: 2010-09-07 | Disposition: A | Discharge status: Home / Self Care | Payer: BC Managed Care – PPO | Source: Ambulatory Visit | Attending: Internal Medicine | Admitting: Internal Medicine

## 2010-09-07 DIAGNOSIS — M5416 Radiculopathy, lumbar region: Secondary | ICD-10-CM

## 2010-09-08 ENCOUNTER — Other Ambulatory Visit: Payer: Self-pay | Admitting: Internal Medicine

## 2010-09-16 ENCOUNTER — Ambulatory Visit (INDEPENDENT_AMBULATORY_CARE_PROVIDER_SITE_OTHER): Payer: BC Managed Care – PPO | Admitting: Emergency Medicine

## 2010-09-16 DIAGNOSIS — I4891 Unspecified atrial fibrillation: Secondary | ICD-10-CM

## 2010-09-16 DIAGNOSIS — Z86718 Personal history of other venous thrombosis and embolism: Secondary | ICD-10-CM

## 2010-09-16 DIAGNOSIS — Z8672 Personal history of thrombophlebitis: Secondary | ICD-10-CM

## 2010-09-16 DIAGNOSIS — I4892 Unspecified atrial flutter: Secondary | ICD-10-CM

## 2010-09-16 LAB — POCT INR: INR: 2.3

## 2010-09-17 ENCOUNTER — Other Ambulatory Visit: Payer: Self-pay | Admitting: Internal Medicine

## 2010-09-29 ENCOUNTER — Ambulatory Visit (INDEPENDENT_AMBULATORY_CARE_PROVIDER_SITE_OTHER): Payer: BC Managed Care – PPO | Admitting: Internal Medicine

## 2010-09-29 ENCOUNTER — Encounter: Payer: Self-pay | Admitting: Internal Medicine

## 2010-09-29 DIAGNOSIS — J9610 Chronic respiratory failure, unspecified whether with hypoxia or hypercapnia: Secondary | ICD-10-CM

## 2010-09-29 DIAGNOSIS — J449 Chronic obstructive pulmonary disease, unspecified: Secondary | ICD-10-CM

## 2010-09-29 NOTE — Progress Notes (Signed)
Subjective:    Patient ID: [name] [name], female    DOB: 04/11/1950, 60 y.o.   MRN: [id number]  HPI  60 yowf last smoked 2003 with COPD with minimum asthmatic component with an FEV1 of 55%, a ratio of 53%, and a diffusing capacity 51% documented 07/25/06. On 02 24 hours per day at 2lpm  6/09 weighed 271 and it was felt she was as debilitated as much by obesity as by airflow obstruction.  baseline = when well = walk to mother in law's house but stops at least once because it's uphill and frequently requiring rescue rx.   March 01, 2008--post hospitalization follow up. Admitted 02/23/08 for post op respiratory difficulties. she underwent left Wrist surgery, post op dyspnea, cough/wheezing. Admitted w/ aggressive resp toilet and IV steroids. Discharged. 02/27/08 improved.  June 20, 2008 ov post hosp for [facility] req d/c cardioversion but no change in rx, walking 30 min daily on 02 c/o increaese cough day = night, dry. wt = 265   March 05, 2010-ov c/o increased SOB with rest and activity x 4-5 days with associated wheezing. . Ankles more swollen. than usual. bnp 292  rec Prednisone taper over next week.  Extra Furosemide 40mg  once daily x 3days  Low salt diet  Legs elevated.    March 10, 2010 ov Dyspnea- worse pulse low, legs swelling no better p prednisone and increasing lasix. L post cp off and on x 3 days but not pleuritic, no cough, no nausea.>> Admitted   March 19, 2010-- Post hospital visit. Admitted 2/28-03/16/10( dc wt at 269 which was about 10 lbs down) for Acute on chronic resp failure secondary to decompensated diastolic heart failure . She was tx with aggressive diuresis. Conintued on coumadin. INR yesterday was 2.4. 2 D echo showed EF 55% w/ gr 2 diastolic dysfunction . VQ scan with low probability for PE. Since discharge some better but still real weak. Cough and congestion are some better. Edema worse this am, took with lasix that helped. no change in rx   March 27, 2010 ov wt no  change from discharge though she feels more swollen, no chest pain, doing wee but not sob with ex. very very poor insight into wt and fluid issues. Was on lasix 40 with option for extra as needed and wasn't taking it so cards increased to 80 daily.   08/26/2010 Follow up and med review.  Pt returns for follow up and med review. We reviewed all her meds and organized them into a med calendar with pt edcuation. She has had a few changes over last months. She has done well over last 6 months with no flare until 2 weeks ago. Seen in the pulmonary clinic with bronchitis , Tx with doxycycline and steroid taper. She is feeling much better but still has some drainage in throat.  No discolored mucus or fever.  rec  Follow  med calendar closely and bring to each visit.  Follow up Dr. Sherene Sires  In 6 weeks  Add Zyrtec 10mg  At bedtime   May use Chlor trimeton in addition if needed if still has symptoms of drainage, drippy nose or throat clearing   09/29/2010 f/u ov/Temika Sutphin cc doe x has to stop x one or twice walking mailbox on 2lpm but overall feels this is marked improvement, still not able to lose wt.  No significant cough  Sleeping ok without nocturnal  or early am exacerbation  of respiratory  c/o's or need for noct saba. Also denies  any obvious fluctuation of symptoms with weather or environmental changes or other aggravating or alleviating factors except as outlined above   ROS  At present neg for  any significant sore throat, dysphagia, itching, sneezing,  nasal congestion or excess/ purulent secretions,  fever, chills, sweats, unintended wt loss, pleuritic or exertional cp, hempoptysis, orthopnea pnd or leg swelling.  Also denies presyncope, palpitations, heartburn, abdominal pain, nausea, vomiting, diarrhea  or change in bowel or urinary habits, dysuria,hematuria,  rash, arthralgias, visual complaints, headache, numbness weakness or ataxia.         Past Medical History:  CHRONIC RHINITIS (ICD-472.0)    EDEMA (ICD-782.3)  ACUTE AND CHRONIC RESPIRATORY FAILURE (ICD-518.84)  MORBID OBESITY (ICD-278.01)  - Target wt = 179 for BMI < 30 peak wt 282  - Referred back to nutrition December 18, 2007  DEEP VENOUS THROMBOPHLEBITIS, HX OF (ICD-V12.52)  COPD (ICD-496)..........................................................Marland KitchenWert  - PFTs 07/25/06 FEV1 55% ratio 53, DLC0 51%  - HFA 50% December 16, 2009 > 75% March 27, 2010  DEPRESSION (ICD-311)  OSTEOPOROSIS (ICD-733.00) last BMD 4/08 -2.4, intolerant of bisphosphonates  HYPERTENSION (ICD-401.9)  Atrial fibrillation - paroxysmal  Anticoagulation therapy  Pulmonary embolism, hx of  neg stress myoview 10/07  Anxiety  glucose intolerance - on steroids  Hyperlipidemia  Complex medical regimen  - Med calendar done 05/01/2008, 08/26/2010              Objective:   Physical Exam  GEN: A/Ox3; pleasant , NAD, obese   Wt  274 09/01/10 >       09/29/2010  275   HEENT:  [place]/AT,  EACs-clear, TMs-wnl, NOSE-clear drainage, THROAT-clear, no lesions, no postnasal drip or exudate noted.   NECK:  Supple w/ fair ROM; no JVD; normal carotid impulses w/o bruits; no thyromegaly or nodules palpated; no lymphadenopathy.  RESP  Clear  P & A; w/o, wheezes/ rales/ or rhonchi.no accessory muscle use, no dullness to percussion  CARD:  RRR, no m/r/g  , tr peripheral edema, pulses intact, no cyanosis or clubbing.  GI:   Soft & nt; nml bowel sounds; no organomegaly or masses detected.  Musco: Warm bil, no deformities or joint swelling noted.   Neuro: alert, no focal deficits noted.    Skin: Warm, no lesions or rashes         Assessment & Plan:

## 2010-09-29 NOTE — Patient Instructions (Signed)
Only use your albuterol as a rescue medication to be used if you can't catch your breath by resting or doing a relaxed purse lip breathing pattern. The less you use it, the better it will work when you need it.   See calendar for specific medication instructions and bring it back for each and every office visit for every healthcare provider you see.  Without it,  you may not receive the best quality medical care that we feel you deserve.  You will note that the calendar groups together  your maintenance  medications that are timed at particular times of the day.  Think of this as your checklist for what your doctor has instructed you to do until your next evaluation to see what benefit  there is  to staying on a consistent group of medications intended to keep you well.  The other group at the bottom is entirely up to you to use as you see fit  for specific symptoms that may arise between visits that require you to treat them on an as needed basis.  Think of this as your action plan or "what if" list.   Separating the top medications from the bottom group is fundamental to providing you adequate care going forward.    Changed zyrtec to as needed only   Please schedule a follow up visit in 3 months but call sooner if needed

## 2010-09-30 ENCOUNTER — Encounter: Payer: Self-pay | Admitting: Internal Medicine

## 2010-09-30 NOTE — Assessment & Plan Note (Signed)
Not presently limited by hypoxemia. Will need to find an activity she can tolerate for up to 30 min of exercise daily to place her into neg calorie balance, otherwise very likely she will deteriorate again with nothing more to offer

## 2010-09-30 NOTE — Assessment & Plan Note (Signed)
Relatively well compensated though on a very complex regimen.    Each maintenance medication was reviewed in detail including most importantly the difference between maintenance and as needed and under what circumstances the prns are to be used.  This was done in the context of a medication calendar review which provided the patient with a user-friendly unambiguous mechanism for medication administration and reconciliation and provides an action plan for all active problems. It is critical that this be shown to every doctor  for modification during the office visit if necessary so the patient can use it as a working document.

## 2010-10-01 ENCOUNTER — Telehealth: Payer: Self-pay | Admitting: *Deleted

## 2010-10-01 NOTE — Telephone Encounter (Signed)
Message copied by Christen Butter on Thu Oct 01, 2010  8:53 AM ------      Message from: [name] [name]      Created: Wed Sep 30, 2010  1:56 PM       Call her to make sure she's using 2lpm 24h per day but 3 lpm with any activity and enter that in her snapshot because she indicated to me she was on 2lpm 24/7 and it wasn't on her med calendar than way

## 2010-10-01 NOTE — Telephone Encounter (Signed)
Spoke with pt and clarified o2 instructions with her. She is aware o2 should be at 2 lpm 24/7 but she should increase to 3 lpm with any exertion. Pt verbalized understanding of this and I added these notes to the snapshot.

## 2010-10-14 ENCOUNTER — Other Ambulatory Visit: Payer: Self-pay

## 2010-10-14 ENCOUNTER — Ambulatory Visit (INDEPENDENT_AMBULATORY_CARE_PROVIDER_SITE_OTHER): Payer: BC Managed Care – PPO | Admitting: Internal Medicine

## 2010-10-14 ENCOUNTER — Other Ambulatory Visit (INDEPENDENT_AMBULATORY_CARE_PROVIDER_SITE_OTHER): Payer: BC Managed Care – PPO

## 2010-10-14 ENCOUNTER — Encounter: Payer: Self-pay | Admitting: Internal Medicine

## 2010-10-14 VITALS — BP 132/80 | HR 73 | Temp 98.4°F | Ht 64.0 in | Wt 274.4 lb

## 2010-10-14 DIAGNOSIS — R21 Rash and other nonspecific skin eruption: Secondary | ICD-10-CM

## 2010-10-14 DIAGNOSIS — R7302 Impaired glucose tolerance (oral): Secondary | ICD-10-CM

## 2010-10-14 DIAGNOSIS — R7309 Other abnormal glucose: Secondary | ICD-10-CM

## 2010-10-14 DIAGNOSIS — Z0000 Encounter for general adult medical examination without abnormal findings: Secondary | ICD-10-CM

## 2010-10-14 DIAGNOSIS — Z23 Encounter for immunization: Secondary | ICD-10-CM

## 2010-10-14 LAB — URINALYSIS, ROUTINE W REFLEX MICROSCOPIC
Bilirubin Urine: NEGATIVE
Hgb urine dipstick: NEGATIVE
Ketones, ur: NEGATIVE
Nitrite: NEGATIVE
Specific Gravity, Urine: 1.015 (ref 1.000–1.030)
Total Protein, Urine: NEGATIVE
Urine Glucose: NEGATIVE
Urobilinogen, UA: 0.2 (ref 0.0–1.0)
pH: 5.5 (ref 5.0–8.0)

## 2010-10-14 LAB — CBC WITH DIFFERENTIAL/PLATELET
Basophils Absolute: 0.1 10*3/uL (ref 0.0–0.1)
Basophils Relative: 0.8 % (ref 0.0–3.0)
Eosinophils Absolute: 0.2 10*3/uL (ref 0.0–0.7)
Eosinophils Relative: 2.3 % (ref 0.0–5.0)
HCT: 36.2 % (ref 36.0–46.0)
Hemoglobin: 12.3 g/dL (ref 12.0–15.0)
Lymphocytes Relative: 32.5 % (ref 12.0–46.0)
Lymphs Abs: 2.2 10*3/uL (ref 0.7–4.0)
MCHC: 34 g/dL (ref 30.0–36.0)
MCV: 87.3 fl (ref 78.0–100.0)
Monocytes Absolute: 0.6 10*3/uL (ref 0.1–1.0)
Monocytes Relative: 8.3 % (ref 3.0–12.0)
Neutro Abs: 3.8 10*3/uL (ref 1.4–7.7)
Neutrophils Relative %: 56.1 % (ref 43.0–77.0)
Platelets: 239 10*3/uL (ref 150.0–400.0)
RBC: 4.14 Mil/uL (ref 3.87–5.11)
RDW: 18 % — ABNORMAL HIGH (ref 11.5–14.6)
WBC: 6.7 10*3/uL (ref 4.5–10.5)

## 2010-10-14 LAB — LIPID PANEL
Cholesterol: 134 mg/dL (ref 0–200)
HDL: 40.9 mg/dL (ref >39.00)
LDL Cholesterol: 71 mg/dL (ref 0–99)
Total CHOL/HDL Ratio: 3
Triglycerides: 113 mg/dL (ref 0.0–149.0)
VLDL: 22.6 mg/dL (ref 0.0–40.0)

## 2010-10-14 LAB — BASIC METABOLIC PANEL
BUN: 16 mg/dL (ref 6–23)
CO2: 33 mEq/L — ABNORMAL HIGH (ref 19–32)
Calcium: 8.7 mg/dL (ref 8.4–10.5)
Chloride: 102 mEq/L (ref 96–112)
Creatinine, Ser: 0.8 mg/dL (ref 0.4–1.2)
GFR: 76.56 mL/min (ref >60.00)
Glucose, Bld: 89 mg/dL (ref 70–99)
Potassium: 3.8 mEq/L (ref 3.5–5.1)
Sodium: 142 mEq/L (ref 135–145)

## 2010-10-14 LAB — TSH: TSH: 4.7 u[IU]/mL (ref 0.35–5.50)

## 2010-10-14 LAB — HEPATIC FUNCTION PANEL
ALT: 20 U/L (ref 0–35)
AST: 20 U/L (ref 0–37)
Albumin: 4.1 g/dL (ref 3.5–5.2)
Alkaline Phosphatase: 68 U/L (ref 39–117)
Bilirubin, Direct: 0.2 mg/dL (ref 0.0–0.3)
Total Bilirubin: 1.6 mg/dL — ABNORMAL HIGH (ref 0.3–1.2)
Total Protein: 7.1 g/dL (ref 6.0–8.3)

## 2010-10-14 MED ORDER — CLOTRIMAZOLE-BETAMETHASONE 1-0.05 % EX CREA: TOPICAL_CREAM | CUTANEOUS | Status: DC

## 2010-10-14 MED ORDER — CLOTRIMAZOLE-BETAMETHASONE 1-0.05 % EX CREA: TOPICAL_CREAM | CUTANEOUS | Status: AC

## 2010-10-14 NOTE — Patient Instructions (Addendum)
Take all new medications as prescribed - the cream Continue all other medications as before Please go to LAB in the Basement for the blood and/or urine tests to be done today Please call the phone number [phone] (the [employer] System) for results of testing in 2-3 days;  When calling, simply dial the number, and when prompted enter the MRN number above (the Medical Record Number) and the # key, then the message should start. You had the shingles shot, and the flu shot today Please return in 6 mo with Lab testing done 3-5 days before

## 2010-10-14 NOTE — Progress Notes (Signed)
Subjective:    Patient ID: [name] [name], female    DOB: 1950/03/02, 60 y.o.   MRN: [id number]  HPI   Here for wellness and f/u;  Overall doing ok;  Pt denies CP, worsening SOB, DOE, wheezing, orthopnea, PND, worsening LE edema, palpitations, dizziness or syncope.  Pt denies neurological change such as new Headache, facial or extremity weakness.  Pt denies polydipsia, polyuria, or low sugar symptoms. Pt states overall good compliance with treatment and medications, good tolerability, and trying to follow lower cholesterol diet.  Pt denies worsening depressive symptoms, suicidal ideation or panic. No fever, wt loss, night sweats, loss of appetite, or other constitutional symptoms.  Pt states good ability with ADL's, low fall risk, home safety reviewed and adequate, no significant changes in hearing or vision, and occasionally active with exercise.   Here with rash to right distal leg above the ankle that does not hurt or even itch much, just not better in the psat 2 wks with neosporin and then cortaid otc.   Due for flu shot today Past Medical History  Diagnosis Date  . Chronic rhinitis   . Edema   . Acute and chronic respiratory failure   . Morbid obesity     target wt=179lb for BMI<30 Peak wt 282lb  . DVT (deep venous thrombosis)   . COPD (chronic obstructive pulmonary disease)     Wert. PFTs 07/25/06 FEV1 55% ratio 53, DLC0 51% HFA 50% 12/16/2009  . Depression   . Osteoporosis     last BMD 4/08 -2.4, intolerant of bisphosphonates  . Hypertension   . Arrhythmia     atrial fibrillation, anticoagulation therapy  . Pulmonary embolism   . Anxiety   . Glucose intolerance (impaired glucose tolerance)     on steroids  . Hyperlipidemia   . VITAMIN D DEFICIENCY 10/27/2007  . HYPERLIPIDEMIA 06/19/2007  . PREMATURE VENTRICULAR CONTRACTIONS 10/23/2008  . Atrial flutter 07/01/2008  . Obstructive chronic bronchitis with exacerbation 01/19/2007  . PNEUMONIA ORGANISM NOS 01/09/2008  . UTI 08/06/2008  .  WRIST PAIN, LEFT 11/27/2009  . ANKLE PAIN 03/28/2008  . Rhabdomyolysis 07/29/2009  . CRAMP IN LIMB 07/29/2009  . OSTEOPOROSIS 07/29/2006  . CHEST PAIN 09/16/2009  . Dysuria 07/22/2008  . FRACTURE, RIB, RIGHT 06/18/2009  . PULMONARY EMBOLISM, HX OF 06/19/2007  . DEEP VENOUS THROMBOPHLEBITIS, HX OF 07/29/2006  . DYSPNEA 03/10/2010   Past Surgical History  Procedure Date  . Abdominal hysterectomy   . Tonsillectomy   . Skin graft to middle r finger 1975  . S/p left arm fracture with fall off chair     reports that she quit smoking about 14 years ago. Her smoking use included Cigarettes. She has a 35 pack-year smoking history. She does not have any smokeless tobacco history on file. She reports that she does not drink alcohol or use illicit drugs. family history includes Asthma in her brother and son; Heart disease in her mother; and Throat cancer in her brother. Allergies  Allergen Reactions  . Duloxetine     REACTION: rhabdomyolysis  . Penicillins    Current Outpatient Prescriptions on File Prior to Visit  Medication Sig Dispense Refill  . acetaminophen (TYLENOL ARTHRITIS PAIN) 650 MG CR tablet Take as directed when needed for pain      . amLODipine-valsartan (EXFORGE) 10-320 MG per tablet Take 1 tablet by mouth daily.        Marland Kitchen atorvastatin (LIPITOR) 10 MG tablet Take 1 tablet (10 mg total) by mouth at  bedtime.      . Calcium Carbonate-Vitamin D 600-400 MG-UNIT per tablet Take 1 tablet by mouth 2 (two) times daily.        . Cetirizine HCl (ZYRTEC ALLERGY) 10 MG CAPS Take 1 capsule (10 mg total) by mouth at bedtime.      . chlorpheniramine (CHLOR-TRIMETON) 4 MG tablet Take 4 mg by mouth every 4 (four) hours as needed.        . cholecalciferol (VITAMIN D) 1000 UNITS tablet Take 1,000 Units by mouth daily.        Marland Kitchen dextromethorphan-guaiFENesin (MUCINEX DM) 30-600 MG per 12 hr tablet 1 every 12 hours as needed w/flutter for cough/congestion      . FLUoxetine (PROZAC) 40 MG capsule TAKE 1 CAPSULE  DAILY  90 capsule  2  . furosemide (LASIX) 80 MG tablet Take 1 tablet by mouth once a day and may add 1/2 extra once daily as needed      . glucosamine-chondroitin 500-400 MG tablet Take 1 tablet by mouth daily.        Marland Kitchen levalbuterol (XOPENEX) 1.25 MG/3ML nebulizer solution Take 1 ampule by nebulization every 4 (four) hours as needed.        . mometasone (NASONEX) 50 MCG/ACT nasal spray Place 1 spray into the nose 2 (two) times daily.      Marland Kitchen omeprazole (PRILOSEC) 20 MG capsule TAKE 1 CAPSULE 30 MINUTES BEFORE FIRST MEAL  90 capsule  3  . oxymetazoline (AFRIN) 0.05 % nasal spray Place 2 sprays into the nose 2 (two) times daily as needed for congestion. For 5 days before nasonex      . potassium chloride (KLOR-CON) 20 MEQ packet Take 20 mEq by mouth 2 (two) times daily.        . potassium chloride SA (K-DUR,KLOR-CON) 20 MEQ tablet Take 1 tablet (20 mEq total) by mouth 2 (two) times daily.      . propafenone (RYTHMOL SR) 325 MG 12 hr capsule Take 1 capsule (325 mg total) by mouth 2 (two) times daily.  180 capsule  3  . sodium chloride (OCEAN) 0.65 % SOLN nasal spray 2 puffs every 4 hours as needed for nasal congestion      . SYMBICORT 160-4.5 MCG/ACT inhaler INHALE 2 PUFFS TWICE A DAY  3 Inhaler  3  . traMADol (ULTRAM) 50 MG tablet Take 50 mg by mouth every 4 (four) hours as needed. For cough       . warfarin (COUMADIN) 7.5 MG tablet Take 1 tablet (7.5 mg total) by mouth as directed. Take as directed by coumadin clinic  120 tablet  1  . XOPENEX HFA 45 MCG/ACT inhaler INHALE 2 PUFFS EVERY 4 HOURS AS NEEDED  3 Inhaler  0   Review of Systems Review of Systems  Constitutional: Negative for diaphoresis, activity change, appetite change and unexpected weight change.  HENT: Negative for hearing loss, ear pain, facial swelling, mouth sores and neck stiffness.   Eyes: Negative for pain, redness and visual disturbance.  Respiratory: Negative for shortness of breath and wheezing.   Cardiovascular: Negative  for chest pain and palpitations.  Gastrointestinal: Negative for diarrhea, blood in stool, abdominal distention and rectal pain.  Genitourinary: Negative for hematuria, flank pain and decreased urine volume.  Musculoskeletal: Negative for myalgias and joint swelling.  Skin: Negative for color change and wound.  Neurological: Negative for syncope and numbness.  Hematological: Negative for adenopathy.  Psychiatric/Behavioral: Negative for hallucinations, self-injury, decreased concentration and agitation.  Objective:   Physical Exam BP 132/80  Pulse 73  Temp(Src) 98.4 F (36.9 C) (Oral)  Ht 5\' 4"  (1.626 m)  Wt 274 lb 6 oz (124.456 kg)  BMI 47.10 kg/m2  SpO2 97% Physical Exam  VS noted Constitutional: Pt appears well-developed and well-nourished.  HENT: Head: Normocephalic.  Right Ear: External ear normal.  Left Ear: External ear normal.  Eyes: Conjunctivae and EOM are normal. Pupils are equal, round, and reactive to light.  Neck: Normal range of motion. Neck supple.  Cardiovascular: Normal rate and regular rhythm.   Pulmonary/Chest: Effort normal and breath sounds decreased BS, no rales or wheezing.  Abd:  Soft, NT, non-distended, + BS Neurological: Pt is alert. No cranial nerve deficit.  Skin: Skin is warm. No erythema. except for 1.5 cm area nontender oval area anterior distal RLE, nonulcerated, silvery/scaly, nonswollen Psychiatric: Pt behavior is normal. Thought content normal.     Assessment & Plan:

## 2010-10-14 NOTE — Assessment & Plan Note (Signed)
Suspect possible ringworm type - for lotrisone asd [prn

## 2010-10-14 NOTE — Assessment & Plan Note (Signed)
Overall doing well, age appropriate education and counseling updated, referrals for preventative services and immunizations addressed, dietary and smoking counseling addressed, most recent labs and ECG reviewed.  I have personally reviewed and have noted: 1) the patient's medical and social history 2) The pt's use of alcohol, tobacco, and illicit drugs 3) The patient's current medications and supplements 4) Functional ability including ADL's, fall risk, home safety risk, hearing and visual impairment 5) Diet and physical activities 6) Evidence for depression or mood disorder 7) The patient's height, weight, and BMI have been recorded in the chart I have made referrals, and provided counseling and education based on review of the above For shingles shot today, and labs as ordered, o/w up to date

## 2010-10-14 NOTE — Assessment & Plan Note (Signed)
Asymtp, for a1c check,  to f/u any worsening symptoms or concerns

## 2010-10-16 ENCOUNTER — Other Ambulatory Visit: Payer: Self-pay | Admitting: Internal Medicine

## 2010-10-16 LAB — CBC
HCT: 32.5 % — ABNORMAL LOW (ref 36.0–46.0)
Hemoglobin: 11 g/dL — ABNORMAL LOW (ref 12.0–15.0)
MCHC: 33.8 g/dL (ref 30.0–36.0)
MCV: 86 fL (ref 78.0–100.0)
Platelets: 221 10*3/uL (ref 150–400)
RBC: 3.78 MIL/uL — ABNORMAL LOW (ref 3.87–5.11)
RDW: 17.5 % — ABNORMAL HIGH (ref 11.5–15.5)
WBC: 13.2 10*3/uL — ABNORMAL HIGH (ref 4.0–10.5)

## 2010-10-16 LAB — PROTIME-INR
INR: 2.1 — ABNORMAL HIGH (ref 0.00–1.49)
INR: 2.1 — ABNORMAL HIGH (ref 0.00–1.49)
INR: 2.3 — ABNORMAL HIGH (ref 0.00–1.49)
Prothrombin Time: 24.8 seconds — ABNORMAL HIGH (ref 11.6–15.2)
Prothrombin Time: 25.2 seconds — ABNORMAL HIGH (ref 11.6–15.2)
Prothrombin Time: 26.5 seconds — ABNORMAL HIGH (ref 11.6–15.2)

## 2010-10-16 LAB — BASIC METABOLIC PANEL
BUN: 10 mg/dL (ref 6–23)
CO2: 31 mEq/L (ref 19–32)
Calcium: 8.3 mg/dL — ABNORMAL LOW (ref 8.4–10.5)
Chloride: 99 mEq/L (ref 96–112)
Creatinine, Ser: 0.85 mg/dL (ref 0.4–1.2)
GFR calc Af Amer: >60 mL/min (ref >60)
GFR calc non Af Amer: >60 mL/min (ref >60)
Glucose, Bld: 128 mg/dL — ABNORMAL HIGH (ref 70–99)
Potassium: 3.2 mEq/L — ABNORMAL LOW (ref 3.5–5.1)
Sodium: 137 mEq/L (ref 135–145)

## 2010-10-16 LAB — CULTURE, RESPIRATORY: Culture: NORMAL

## 2010-10-16 LAB — EXPECTORATED SPUTUM ASSESSMENT W REFEX TO RESP CULTURE

## 2010-10-16 LAB — B-NATRIURETIC PEPTIDE (CONVERTED LAB): Pro B Natriuretic peptide (BNP): 71 pg/mL (ref 0.0–100.0)

## 2010-10-16 LAB — HEMOGLOBIN A1C: Hgb A1c MFr Bld: 4.6 % (ref 4.6–6.5)

## 2010-10-16 LAB — CULTURE, RESPIRATORY W GRAM STAIN

## 2010-10-16 LAB — EXPECTORATED SPUTUM ASSESSMENT W GRAM STAIN, RFLX TO RESP C

## 2010-10-20 LAB — POCT I-STAT 3, ART BLOOD GAS (G3+)
Acid-Base Excess: 2
Acid-Base Excess: 2
Acid-Base Excess: 4 — ABNORMAL HIGH
Acid-Base Excess: 4 — ABNORMAL HIGH
Bicarbonate: 30.7 — ABNORMAL HIGH
Bicarbonate: 31.4 — ABNORMAL HIGH
Bicarbonate: 32.1 — ABNORMAL HIGH
Bicarbonate: 32.1 — ABNORMAL HIGH
O2 Saturation: 93
O2 Saturation: 94
O2 Saturation: 95
O2 Saturation: 98
Operator id: 283371
Operator id: 283371
Operator id: 290171
Operator id: 290171
Patient temperature: 98.6
Patient temperature: 98.6
Patient temperature: 98.7
Patient temperature: 98.9
TCO2: 32
TCO2: 33
TCO2: 34
TCO2: 34
pCO2 arterial: 54.3 — ABNORMAL HIGH
pCO2 arterial: 60.8
pCO2 arterial: 75.7
pCO2 arterial: 77.5
pH, Arterial: 7.226 — ABNORMAL LOW
pH, Arterial: 7.236 — ABNORMAL LOW
pH, Arterial: 7.32 — ABNORMAL LOW
pH, Arterial: 7.361
pO2, Arterial: 118 — ABNORMAL HIGH
pO2, Arterial: 75 — ABNORMAL LOW
pO2, Arterial: 82
pO2, Arterial: 94

## 2010-10-20 LAB — CK TOTAL AND CKMB (NOT AT ARMC)
CK, MB: 2
Relative Index: INVALID
Total CK: 86

## 2010-10-20 LAB — CBC
HCT: 33.5 — ABNORMAL LOW
HCT: 35.6 — ABNORMAL LOW
Hemoglobin: 11.5 — ABNORMAL LOW
Hemoglobin: 12.4
MCHC: 34.3
MCHC: 34.8
MCV: 86.3
MCV: 86.4
Platelets: 217
Platelets: 225
RBC: 3.88
RBC: 4.13
RDW: 16.2 — ABNORMAL HIGH
RDW: 16.5 — ABNORMAL HIGH
WBC: 7.2
WBC: 8

## 2010-10-20 LAB — BASIC METABOLIC PANEL
BUN: 13
CO2: 31
Calcium: 8.1 — ABNORMAL LOW
Chloride: 96
Creatinine, Ser: 1.01
GFR calc Af Amer: >60
GFR calc non Af Amer: 57 — ABNORMAL LOW
Glucose, Bld: 108 — ABNORMAL HIGH
Potassium: 3.1 — ABNORMAL LOW
Sodium: 136

## 2010-10-20 LAB — CARDIAC PANEL(CRET KIN+CKTOT+MB+TROPI)
CK, MB: 1.9
Relative Index: 1.8
Total CK: 108
Troponin I: 0.01

## 2010-10-20 LAB — DIFFERENTIAL
Basophils Absolute: 0
Basophils Relative: 1
Eosinophils Absolute: 0.2
Eosinophils Relative: 3
Lymphocytes Relative: 39
Lymphs Abs: 2.8
Monocytes Absolute: 0.7
Monocytes Relative: 10
Neutro Abs: 3.5
Neutrophils Relative %: 48

## 2010-10-20 LAB — POCT I-STAT CREATININE
Creatinine, Ser: 1.2
Operator id: 270651

## 2010-10-20 LAB — LEGIONELLA ANTIGEN, URINE: Legionella Antigen, Urine: NEGATIVE

## 2010-10-20 LAB — I-STAT 8, (EC8 V) (CONVERTED LAB)
Acid-Base Excess: 5 — ABNORMAL HIGH
BUN: 20
Bicarbonate: 29.3 — ABNORMAL HIGH
Chloride: 104
Glucose, Bld: 110 — ABNORMAL HIGH
HCT: 37
Hemoglobin: 12.6
Operator id: 270651
Potassium: 3.6
Sodium: 140
TCO2: 30
pCO2, Ven: 39.4 — ABNORMAL LOW
pH, Ven: 7.479 — ABNORMAL HIGH

## 2010-10-20 LAB — B-NATRIURETIC PEPTIDE (CONVERTED LAB): Pro B Natriuretic peptide (BNP): 238 — ABNORMAL HIGH

## 2010-10-20 LAB — POCT CARDIAC MARKERS
CKMB, poc: <1 — ABNORMAL LOW
CKMB, poc: <1 — ABNORMAL LOW
Myoglobin, poc: 37.3
Myoglobin, poc: 52.7
Operator id: 270651
Operator id: 270651
Troponin i, poc: <0.05
Troponin i, poc: <0.05

## 2010-10-20 LAB — D-DIMER, QUANTITATIVE: D-Dimer, Quant: <0.22

## 2010-10-20 LAB — PROTIME-INR
INR: 2.3 — ABNORMAL HIGH
Prothrombin Time: 26.3 — ABNORMAL HIGH

## 2010-10-20 LAB — STREP PNEUMONIAE URINARY ANTIGEN: Strep Pneumo Urinary Antigen: POSITIVE — AB

## 2010-10-20 LAB — TROPONIN I: Troponin I: 0.01

## 2010-10-21 ENCOUNTER — Ambulatory Visit (INDEPENDENT_AMBULATORY_CARE_PROVIDER_SITE_OTHER): Payer: BC Managed Care – PPO | Admitting: Emergency Medicine

## 2010-10-21 DIAGNOSIS — Z86718 Personal history of other venous thrombosis and embolism: Secondary | ICD-10-CM

## 2010-10-21 DIAGNOSIS — I4891 Unspecified atrial fibrillation: Secondary | ICD-10-CM

## 2010-10-21 DIAGNOSIS — I4892 Unspecified atrial flutter: Secondary | ICD-10-CM

## 2010-10-21 DIAGNOSIS — Z8672 Personal history of thrombophlebitis: Secondary | ICD-10-CM

## 2010-10-21 LAB — POCT INR: INR: 2

## 2010-11-12 ENCOUNTER — Telehealth: Payer: Self-pay | Admitting: Internal Medicine

## 2010-11-18 ENCOUNTER — Ambulatory Visit (INDEPENDENT_AMBULATORY_CARE_PROVIDER_SITE_OTHER): Payer: BC Managed Care – PPO | Admitting: Emergency Medicine

## 2010-11-18 ENCOUNTER — Telehealth: Payer: Self-pay

## 2010-11-18 DIAGNOSIS — Z7901 Long term (current) use of anticoagulants: Secondary | ICD-10-CM

## 2010-11-18 DIAGNOSIS — I4892 Unspecified atrial flutter: Secondary | ICD-10-CM

## 2010-11-18 DIAGNOSIS — Z8672 Personal history of thrombophlebitis: Secondary | ICD-10-CM

## 2010-11-18 DIAGNOSIS — Z86718 Personal history of other venous thrombosis and embolism: Secondary | ICD-10-CM

## 2010-11-18 DIAGNOSIS — I4891 Unspecified atrial fibrillation: Secondary | ICD-10-CM

## 2010-11-18 LAB — POCT INR: INR: 1.8

## 2010-11-18 NOTE — Telephone Encounter (Signed)
Pt is scheduled for surgical tooth extraction on 11/25/10.  Called Dr Ramon Dredge Scott's office [phone] spoke with dental hygenist she states pt does need to hold Coumadin x 3 days prior to tooth extraction.  Please advise if ok for pt to hold Coumadin prior to procedure.  Will fax authorization to their office [phone], and I will call pt and advise.  Pt needs INR check next Wednesday 11/25/10 with copy of result for DDS prior to her 11am scheduled appt.

## 2010-11-19 NOTE — Telephone Encounter (Signed)
Ok to hold warfarin She is always sinus rhythm thx Tim

## 2010-11-20 NOTE — Telephone Encounter (Signed)
Called spoke with pt advised OK per Dr Mariah Milling to hold Coumadin prior to dental extraction. Pt aware

## 2010-11-23 ENCOUNTER — Encounter: Payer: Self-pay | Admitting: *Deleted

## 2010-11-23 NOTE — Telephone Encounter (Signed)
Clearance letter faxed to Dr. Lorin Picket.

## 2010-11-25 ENCOUNTER — Ambulatory Visit (INDEPENDENT_AMBULATORY_CARE_PROVIDER_SITE_OTHER): Payer: BC Managed Care – PPO | Admitting: Emergency Medicine

## 2010-11-25 DIAGNOSIS — Z86718 Personal history of other venous thrombosis and embolism: Secondary | ICD-10-CM

## 2010-11-25 DIAGNOSIS — Z8672 Personal history of thrombophlebitis: Secondary | ICD-10-CM

## 2010-11-25 DIAGNOSIS — I4892 Unspecified atrial flutter: Secondary | ICD-10-CM

## 2010-11-25 DIAGNOSIS — I4891 Unspecified atrial fibrillation: Secondary | ICD-10-CM

## 2010-11-25 DIAGNOSIS — Z7901 Long term (current) use of anticoagulants: Secondary | ICD-10-CM

## 2010-11-25 LAB — POCT INR: INR: 1

## 2010-11-26 ENCOUNTER — Telehealth: Payer: Self-pay | Admitting: Internal Medicine

## 2010-11-26 ENCOUNTER — Ambulatory Visit (INDEPENDENT_AMBULATORY_CARE_PROVIDER_SITE_OTHER): Payer: BC Managed Care – PPO

## 2010-11-26 DIAGNOSIS — M810 Age-related osteoporosis without current pathological fracture: Secondary | ICD-10-CM

## 2010-11-26 DIAGNOSIS — L989 Disorder of the skin and subcutaneous tissue, unspecified: Secondary | ICD-10-CM

## 2010-11-26 MED ORDER — DENOSUMAB 60 MG/ML ~~LOC~~ SOLN
60.0000 mg | Freq: Once | SUBCUTANEOUS | Status: AC
  Administered 2010-11-26: 60 mg via SUBCUTANEOUS

## 2010-11-26 MED ORDER — DENOSUMAB 60 MG/ML ~~LOC~~ SOLN
60.0000 mg | Freq: Once | SUBCUTANEOUS | Status: DC
Start: 2010-11-26 — End: 2010-11-26

## 2010-11-26 NOTE — Telephone Encounter (Signed)
Done per emr 

## 2010-11-26 NOTE — Telephone Encounter (Signed)
Message copied by Corwin Levins on Thu Nov 26, 2010 10:24 AM ------      Message from: Scharlene Gloss B      Created: Thu Nov 26, 2010 10:11 AM      Regarding: referral       The patient would like a referral to see a dermatologist for a spot on her head. She stated you looked at it at her last OV. She came in for her prolia today and mentioned it is still there and she would like to be seen by a dermatologist

## 2010-12-02 ENCOUNTER — Ambulatory Visit (INDEPENDENT_AMBULATORY_CARE_PROVIDER_SITE_OTHER): Payer: BC Managed Care – PPO | Admitting: Emergency Medicine

## 2010-12-02 DIAGNOSIS — I4891 Unspecified atrial fibrillation: Secondary | ICD-10-CM

## 2010-12-02 DIAGNOSIS — Z86718 Personal history of other venous thrombosis and embolism: Secondary | ICD-10-CM

## 2010-12-02 DIAGNOSIS — Z8672 Personal history of thrombophlebitis: Secondary | ICD-10-CM

## 2010-12-02 DIAGNOSIS — Z7901 Long term (current) use of anticoagulants: Secondary | ICD-10-CM

## 2010-12-02 DIAGNOSIS — I4892 Unspecified atrial flutter: Secondary | ICD-10-CM

## 2010-12-02 LAB — POCT INR: INR: 1.7

## 2010-12-10 ENCOUNTER — Telehealth: Payer: Self-pay

## 2010-12-10 NOTE — Telephone Encounter (Signed)
Same advice; there were very lots involved, and should be ok to cont as she has after checking with the pharmacy

## 2010-12-10 NOTE — Telephone Encounter (Signed)
Pt called requesting advisement on Lipitor recall. Pt says that she received a e-mail that stated that some tablets may contain glass. Pt advised to contact company that contacted her via phone number provided in the e-mail and to also contact [facility] pharmacy. Pt understood and is also requesting advisement from MD.

## 2010-12-11 NOTE — Telephone Encounter (Signed)
Patient informed. 

## 2010-12-15 ENCOUNTER — Ambulatory Visit: Payer: BC Managed Care – PPO | Admitting: Internal Medicine

## 2010-12-23 ENCOUNTER — Ambulatory Visit (INDEPENDENT_AMBULATORY_CARE_PROVIDER_SITE_OTHER): Payer: BC Managed Care – PPO | Admitting: Emergency Medicine

## 2010-12-23 ENCOUNTER — Other Ambulatory Visit: Payer: Self-pay | Admitting: Internal Medicine

## 2010-12-23 DIAGNOSIS — I4891 Unspecified atrial fibrillation: Secondary | ICD-10-CM

## 2010-12-23 DIAGNOSIS — Z7901 Long term (current) use of anticoagulants: Secondary | ICD-10-CM

## 2010-12-23 DIAGNOSIS — Z8672 Personal history of thrombophlebitis: Secondary | ICD-10-CM

## 2010-12-23 DIAGNOSIS — Z86718 Personal history of other venous thrombosis and embolism: Secondary | ICD-10-CM

## 2010-12-23 DIAGNOSIS — I4892 Unspecified atrial flutter: Secondary | ICD-10-CM

## 2010-12-23 LAB — POCT INR: INR: 1.9

## 2010-12-25 ENCOUNTER — Telehealth: Payer: Self-pay | Admitting: *Deleted

## 2010-12-25 NOTE — Telephone Encounter (Signed)
Pt called stating last night felt like "heart beating out of chest," BP then 160/89 HR 89. She has had "some pains in chest, but I have COPD also so unsure if it is heart or lungs." Pt is asymptomatic at this time, HR today 50s. Pt does have h/o a fib, she is taking warfarin, on rhythmol and exforge 10-320 for BP. Pt last seen 03/2010, no f/u after. I have schedule f/u for 01/15/11 with TG, she comes in next Wed for coumadin and told pt will do EKG. Advised she document BP/HR twice a day and if symptomatic over weekend. Pt will call back Monday with results, if HR incr or symptoms return prior to Wed will have pt come in sooner. Pt ok with this.

## 2010-12-30 ENCOUNTER — Ambulatory Visit (INDEPENDENT_AMBULATORY_CARE_PROVIDER_SITE_OTHER): Payer: BC Managed Care – PPO | Admitting: Emergency Medicine

## 2010-12-30 ENCOUNTER — Other Ambulatory Visit: Payer: Self-pay | Admitting: Cardiovascular Disease

## 2010-12-30 ENCOUNTER — Encounter: Payer: Self-pay | Admitting: *Deleted

## 2010-12-30 ENCOUNTER — Ambulatory Visit (INDEPENDENT_AMBULATORY_CARE_PROVIDER_SITE_OTHER): Payer: BC Managed Care – PPO | Admitting: *Deleted

## 2010-12-30 DIAGNOSIS — I4892 Unspecified atrial flutter: Secondary | ICD-10-CM

## 2010-12-30 DIAGNOSIS — Z86718 Personal history of other venous thrombosis and embolism: Secondary | ICD-10-CM

## 2010-12-30 DIAGNOSIS — Z8672 Personal history of thrombophlebitis: Secondary | ICD-10-CM

## 2010-12-30 DIAGNOSIS — Z7901 Long term (current) use of anticoagulants: Secondary | ICD-10-CM

## 2010-12-30 DIAGNOSIS — I4891 Unspecified atrial fibrillation: Secondary | ICD-10-CM

## 2010-12-30 LAB — POCT INR: INR: 1.6

## 2010-12-30 NOTE — Patient Instructions (Signed)
Your EKG today is normal, HR is good at 72.  If you have any more episodes of chest discomfort or palpitations, please check BP/HR and call the office. Otherwise follow up with Dr. Mariah Milling at scheduled appt 01/14/10 at 11:15 AM.

## 2010-12-30 NOTE — Progress Notes (Signed)
On 12/13 pt had episode of "heart beating out of chest," see phone note 12/14. EKG today shows NSR HR 72. Pt is taking coumadin for h/o a fib/flutter, h/o PE and DVT. INR today was 1.6. She does have COPD and will f/u with Dr. Wyn Quaker this Friday. She has not seen cardiologist since she was diagnosed with CHF. She has f/u with Dr. Mariah Milling next week 01/15/11. Pt is asymptomatic today. She brought in her BP/HR readings as requested and only one episode of HR 101 but pt says she was asymptomatic at this time. She will monitor sx in meantime and knows to call the office with any changes.  BP starting 12/14: 0725 157/84 78, 0900 135/70 76, 1400 161/69 88; 12/15 0715 155/85 81, 1700 141/80 85, 12/16 0600 145/82 80, 2030 162/70 101, 12/17 0900 129/72 76, 1500 150/79 78, 12/18 0900 151/81 72 1300 124/78 85.

## 2011-01-01 ENCOUNTER — Encounter: Payer: Self-pay | Admitting: Internal Medicine

## 2011-01-01 ENCOUNTER — Ambulatory Visit (INDEPENDENT_AMBULATORY_CARE_PROVIDER_SITE_OTHER): Payer: BC Managed Care – PPO | Admitting: Internal Medicine

## 2011-01-01 VITALS — BP 146/82 | HR 74 | Temp 97.9°F | Ht 64.0 in | Wt 280.0 lb

## 2011-01-01 DIAGNOSIS — J449 Chronic obstructive pulmonary disease, unspecified: Secondary | ICD-10-CM

## 2011-01-01 DIAGNOSIS — J4489 Other specified chronic obstructive pulmonary disease: Secondary | ICD-10-CM

## 2011-01-01 NOTE — Progress Notes (Signed)
Subjective:    Patient ID: [name] [name], female    DOB: 1950/08/24, 60 y.o.   MRN: [id number]  HPI  60 yowf last smoked 2003 with COPD with minimum asthmatic component with an FEV1 of 55%, a ratio of 53%, and a diffusing capacity 51% documented 07/25/06. On 02 24 hours per day at 2lpm  6/09 weighed 271 and it was felt she was as debilitated as much by obesity as by airflow obstruction.  baseline = when well = walk to mother in law's house but stops at least once because it's uphill and frequently requiring rescue rx.   March 01, 2008--post hospitalization follow up. Admitted 02/23/08 for post op respiratory difficulties. she underwent left Wrist surgery, post op dyspnea, cough/wheezing. Admitted w/ aggressive resp toilet and IV steroids. Discharged. 02/27/08 improved.  June 20, 2008 ov post hosp for [facility] req d/c cardioversion but no change in rx, walking 30 min daily on 02 c/o increaese cough day = night, dry. wt = 265   March 05, 2010-ov c/o increased SOB with rest and activity x 4-5 days with associated wheezing. . Ankles more swollen. than usual. bnp 292  rec Prednisone taper over next week.  Extra Furosemide 40mg  once daily x 3days  Low salt diet  Legs elevated.    March 10, 2010 ov Dyspnea- worse pulse low, legs swelling no better p prednisone and increasing lasix. L post cp off and on x 3 days but not pleuritic, no cough, no nausea.>> Admitted   March 19, 2010-- Post hospital visit. Admitted 2/28-03/16/10( dc wt at 269 which was about 10 lbs down) for Acute on chronic resp failure secondary to decompensated diastolic heart failure . She was tx with aggressive diuresis. Conintued on coumadin. INR yesterday was 2.4. 2 D echo showed EF 55% w/ gr 2 diastolic dysfunction . VQ scan with low probability for PE. Since discharge some better but still real weak. Cough and congestion are some better. Edema worse this am, took with lasix that helped. no change in rx   09/29/2010 f/u ov/Elyshia Kumagai cc  doe x has to stop x one or twice walking mailbox on 2lpm but overall feels this is marked improvement, still not able to lose wt.  No significant cough rec Only use your albuterol as a rescue medication to be used if you can't catch your breath by resting or doing a relaxed purse lip breathing pattern. The less you use it, the better it will work when you need it.   See calendar for specific medication instructions and bring it back for each and every office visit   Changed zyrtec to as needed only    01/01/2011 f/u ov/Arella Blinder using med calendar well but not following contingencies cc increase doe and leg  swelling since last ov doesn't think 2lpm at rest is enough, uses 3lpm with activity- no cough or variability. Some better p saba but mostly using it daytime, not ever more than twice daily.  Sleeping ok without nocturnal  or early am exacerbation  of respiratory  c/o's or need for noct saba. Also denies any obvious fluctuation of symptoms with weather or environmental changes or other aggravating or alleviating factors except as outlined above   ROS  At present neg for  any significant sore throat, dysphagia, itching, sneezing,  nasal congestion or excess/ purulent secretions,  fever, chills, sweats, unintended wt loss, pleuritic or exertional cp, hempoptysis, orthopnea pnd.  Also denies presyncope, palpitations, heartburn, abdominal pain, nausea, vomiting, diarrhea  or  change in bowel or urinary habits, dysuria,hematuria,  rash, arthralgias, visual complaints, headache, numbness weakness or ataxia.         Past Medical History:  CHRONIC RHINITIS (ICD-472.0)  EDEMA (ICD-782.3)  ACUTE AND CHRONIC RESPIRATORY FAILURE (ICD-518.84)  MORBID OBESITY (ICD-278.01)  - Target wt = 179 for BMI < 30 peak wt 282  - Referred back to nutrition December 18, 2007  DEEP VENOUS THROMBOPHLEBITIS, HX OF (ICD-V12.52)  COPD (ICD-496)..........................................................Marland KitchenWert  - PFTs  07/25/06 FEV1 55% ratio 53, DLC0 51%  - HFA 50% December 16, 2009 > 75% March 27, 2010  DEPRESSION (ICD-311)  OSTEOPOROSIS (ICD-733.00) last BMD 4/08 -2.4, intolerant of bisphosphonates  HYPERTENSION (ICD-401.9)  Atrial fibrillation - paroxysmal  Anticoagulation therapy  Pulmonary embolism, hx of  neg stress myoview 10/07  Anxiety  glucose intolerance - on steroids  Hyperlipidemia  Complex medical regimen  - Med calendar done 05/01/2008, 08/26/2010              Objective:   Physical Exam  GEN: A/Ox3; pleasant , NAD, obese   Wt  274 09/01/10 >   09/29/2010  275 > 01/01/2011  280   HEENT:  [place]/AT,  EACs-clear, TMs-wnl, NOSE-clear drainage, THROAT-clear, no lesions, no postnasal drip or exudate noted.   NECK:  Supple w/ fair ROM; no JVD; normal carotid impulses w/o bruits; no thyromegaly or nodules palpated; no lymphadenopathy.  RESP  Clear  P & A; w/o, wheezes/ rales/ or rhonchi.no accessory muscle use, no dullness to percussion  CARD:  RRR, no m/r/g  , tr peripheral edema, pulses intact, no cyanosis or clubbing.  GI:   Soft & nt; nml bowel sounds; no organomegaly or masses detected.  Musco: Warm bil, no deformities or joint swelling noted.   Neuro: alert, no focal deficits noted.    Skin: Warm, no lesions or rashes         Assessment & Plan:

## 2011-01-01 NOTE — Patient Instructions (Signed)
See calendar for specific medication instructions and bring it back for each and every office visit for every healthcare provider you see.  Without it,  you may not receive the best quality medical care that we feel you deserve.  You will note that the calendar groups together  your maintenance  medications that are timed at particular times of the day.  Think of this as your checklist for what your doctor has instructed you to do until your next evaluation to see what benefit  there is  to staying on a consistent group of medications intended to keep you well.  The other group at the bottom is entirely up to you to use as you see fit  for specific symptoms that may arise between visits that require you to treat them on an as needed basis.  Think of this as your action plan or "what if" list.   Separating the top medications from the bottom group is fundamental to providing you adequate care going forward.    Agree you should use an extra potassium when you take an extra as needed furosemide as per Dr Jonny Ruiz and Gholan's recommendations  Please schedule a follow up visit in 3 months but call sooner if needed

## 2011-01-03 NOTE — Assessment & Plan Note (Signed)
I had an extended discussion with the patient today lasting 15 to 20 minutes of a 25 minute visit on the following issues:    Each maintenance medication was reviewed in detail including most importantly the difference between maintenance and as needed and under what circumstances the prns are to be used.  This was done in the context of a medication calendar review which provided the patient with a user-friendly unambiguous mechanism for medication administration and reconciliation and provides an action plan for all active problems. It is critical that this be shown to every doctor  for modification during the office visit if necessary so the patient can use it as a working document.   Ok to use extra dose of furosemide prn as per calendar with one extra dose kcl  Approp use of saba also stressed  Wt loss still not being addressed.

## 2011-01-07 ENCOUNTER — Ambulatory Visit (INDEPENDENT_AMBULATORY_CARE_PROVIDER_SITE_OTHER): Payer: BC Managed Care – PPO | Admitting: Emergency Medicine

## 2011-01-07 DIAGNOSIS — Z7901 Long term (current) use of anticoagulants: Secondary | ICD-10-CM

## 2011-01-07 DIAGNOSIS — I4891 Unspecified atrial fibrillation: Secondary | ICD-10-CM

## 2011-01-07 DIAGNOSIS — I4892 Unspecified atrial flutter: Secondary | ICD-10-CM

## 2011-01-07 DIAGNOSIS — Z8672 Personal history of thrombophlebitis: Secondary | ICD-10-CM

## 2011-01-07 DIAGNOSIS — Z86718 Personal history of other venous thrombosis and embolism: Secondary | ICD-10-CM

## 2011-01-07 LAB — POCT INR: INR: 3.1

## 2011-01-15 ENCOUNTER — Ambulatory Visit: Payer: BC Managed Care – PPO | Admitting: Cardiovascular Disease

## 2011-01-20 ENCOUNTER — Ambulatory Visit (INDEPENDENT_AMBULATORY_CARE_PROVIDER_SITE_OTHER): Payer: BC Managed Care – PPO | Admitting: Emergency Medicine

## 2011-01-20 DIAGNOSIS — Z8672 Personal history of thrombophlebitis: Secondary | ICD-10-CM

## 2011-01-20 DIAGNOSIS — I4892 Unspecified atrial flutter: Secondary | ICD-10-CM | POA: Diagnosis not present

## 2011-01-20 DIAGNOSIS — I4891 Unspecified atrial fibrillation: Secondary | ICD-10-CM

## 2011-01-20 DIAGNOSIS — Z7901 Long term (current) use of anticoagulants: Secondary | ICD-10-CM

## 2011-01-20 DIAGNOSIS — Z86718 Personal history of other venous thrombosis and embolism: Secondary | ICD-10-CM | POA: Diagnosis not present

## 2011-01-20 LAB — POCT INR: INR: 2.7

## 2011-02-01 ENCOUNTER — Encounter: Payer: Self-pay | Admitting: Cardiovascular Disease

## 2011-02-01 ENCOUNTER — Ambulatory Visit (INDEPENDENT_AMBULATORY_CARE_PROVIDER_SITE_OTHER): Payer: BC Managed Care – PPO | Admitting: Cardiovascular Disease

## 2011-02-01 DIAGNOSIS — I4891 Unspecified atrial fibrillation: Secondary | ICD-10-CM | POA: Diagnosis not present

## 2011-02-01 DIAGNOSIS — I10 Essential (primary) hypertension: Secondary | ICD-10-CM

## 2011-02-01 DIAGNOSIS — E785 Hyperlipidemia, unspecified: Secondary | ICD-10-CM | POA: Diagnosis not present

## 2011-02-01 DIAGNOSIS — R079 Chest pain, unspecified: Secondary | ICD-10-CM | POA: Insufficient documentation

## 2011-02-01 MED ORDER — TRAMADOL HCL 50 MG PO TABS: 50.0000 mg | ORAL_TABLET | ORAL | Status: DC | PRN

## 2011-02-01 MED ORDER — NITROGLYCERIN 0.4 MG SL SUBL: 0.4000 mg | SUBLINGUAL_TABLET | SUBLINGUAL | Status: DC | PRN

## 2011-02-01 NOTE — Assessment & Plan Note (Signed)
Cholesterol is at goal on the current lipid regimen. No changes to the medications were made.  

## 2011-02-01 NOTE — Assessment & Plan Note (Signed)
Atypical chest pain yesterday, now without symptoms. Previous cardiac catheterization showing no significant disease. EKG is essentially normal. No further workup at this time. We have asked her to call our office if she has recurrent symptoms. We will give her nitroglycerin p.r.n. For possible spasm. Uncertain if her symptoms are musculoskeletal.

## 2011-02-01 NOTE — Progress Notes (Signed)
Patient ID: [name] [name], female    DOB: Dec 16, 1950, 61 y.o.   MRN: [id number]  HPI Comments: [name] [name] is a pleasant 61 year old woman with a history of smoking for 35 years, stopped 10 years ago, history of atrial fibrillation dating back to 2010,  history of PE on chronic Coumadin,  hospitalization for acute on chronic respiratory failure felt secondary to diastolic dysfunction, morbid obesity and COPD. She presents for routine followup.     notes indicate she was admitted to the hospital on November 07 2009. She had diuresis, oxygen, bronchodilators. She had atrial fibrillation while in the hospital, maintained on Rythmol. Discharged on March 5. During this period she lost 10 pounds. She was given Avelox for sinusitis.   She reports that she has been doing well recently. Over the past several weeks, she has been battling an upper respiratory infection and sinusitis. It is slowly getting better.  She did have chest pain yesterday, "All day". It was nonexertional, seem to come and go she was sitting most of the day. She denied any other associated symptoms such as lightheadedness, radiation of her pain, diaphoresis, nausea vomiting. Today she feels better and has no complaints. She has not been having chest pain on a regular basis and this was the first episode in a long time   Echocardiogram March 10 2010 shows normal systolic function, diastolic dysfunction, mild MR, elevated right ventricular systolic pressure consistent with mild pulmonary hypertension   cardiac catheter in 2003 showing no significant coronary artery disease  EKG shows normal sinus rhythm with rate 73 beats per minute with no significant ST or T wave changes.      Outpatient Encounter Prescriptions as of 02/01/2011  Medication Sig Dispense Refill  . acetaminophen (TYLENOL ARTHRITIS PAIN) 650 MG CR tablet Take as directed when needed for pain      . atorvastatin (LIPITOR) 10 MG tablet Take 1 tablet (10 mg total) by  mouth at bedtime.      . Calcium Carbonate-Vitamin D 600-400 MG-UNIT per tablet Take 1 tablet by mouth 2 (two) times daily.        . Cetirizine HCl (ZYRTEC ALLERGY) 10 MG CAPS Take 1 capsule (10 mg total) by mouth at bedtime.      . cholecalciferol (VITAMIN D) 1000 UNITS tablet Take 1,000 Units by mouth daily.        . clotrimazole-betamethasone (LOTRISONE) cream Use as directed twice per day as needed for rash  15 g  1  . dextromethorphan-guaiFENesin (MUCINEX DM) 30-600 MG per 12 hr tablet 1 every 12 hours as needed w/flutter for cough/congestion      . EXFORGE 10-320 MG per tablet TAKE 1 TABLET DAILY  90 tablet  3  . FLUoxetine (PROZAC) 40 MG capsule TAKE 1 CAPSULE DAILY  90 capsule  1  . furosemide (LASIX) 80 MG tablet Take 80 mg by mouth 2 (two) times daily. Take 1 tablet by mouth once a day and may add 1/2 extra once daily as needed      . glucosamine-chondroitin 500-400 MG tablet Take 1 tablet by mouth daily.        Marland Kitchen levalbuterol (XOPENEX) 1.25 MG/3ML nebulizer solution Take 1 ampule by nebulization every 4 (four) hours as needed.        . mometasone (NASONEX) 50 MCG/ACT nasal spray Place 1 spray into the nose 2 (two) times daily.      Marland Kitchen omeprazole (PRILOSEC) 20 MG capsule TAKE 1 CAPSULE 30 MINUTES BEFORE FIRST  MEAL  90 capsule  3  . oxymetazoline (AFRIN) 0.05 % nasal spray Place 2 sprays into the nose 2 (two) times daily as needed for congestion. For 5 days before nasonex      . potassium chloride SA (K-DUR,KLOR-CON) 20 MEQ tablet Take 1 tablet (20 mEq total) by mouth 2 (two) times daily.      . propafenone (RYTHMOL SR) 325 MG 12 hr capsule Take 1 capsule (325 mg total) by mouth 2 (two) times daily.  180 capsule  3  . sodium chloride (OCEAN) 0.65 % SOLN nasal spray 2 puffs every 4 hours as needed for nasal congestion      . SYMBICORT 160-4.5 MCG/ACT inhaler INHALE 2 PUFFS TWICE A DAY  3 Inhaler  3  . traMADol (ULTRAM) 50 MG tablet Take 1 tablet (50 mg total) by mouth every 4 (four) hours as  needed. For cough  30 tablet  0  . warfarin (COUMADIN) 7.5 MG tablet Take 1 tablet (7.5 mg total) by mouth as directed. Take as directed by coumadin clinic  120 tablet  1  . XOPENEX HFA 45 MCG/ACT inhaler INHALE 2 PUFFS EVERY 4 HOURS AS NEEDED  3 Inhaler  0  . DISCONTD: traMADol (ULTRAM) 50 MG tablet Take 50 mg by mouth every 4 (four) hours as needed. For cough       . nitroGLYCERIN (NITROSTAT) 0.4 MG SL tablet Place 1 tablet (0.4 mg total) under the tongue every 5 (five) minutes as needed for chest pain.  25 tablet  3     Review of Systems  Constitutional: Negative.   HENT: Negative.   Eyes: Negative.   Respiratory: Negative.   Cardiovascular: Positive for chest pain and leg swelling.  Gastrointestinal: Negative.   Musculoskeletal: Negative.   Skin: Negative.   Neurological: Negative.   Hematological: Negative.   Psychiatric/Behavioral: Negative.   All other systems reviewed and are negative.    BP 126/70  Pulse 73  Ht 5\' 4"  (1.626 m)  Wt 279 lb 1.9 oz (126.608 kg)  BMI 47.91 kg/m2  Physical Exam  Nursing note and vitals reviewed. Constitutional: She is oriented to person, place, and time. She appears well-developed and well-nourished.       Obese   HENT:  Head: Normocephalic.  Nose: Nose normal.  Mouth/Throat: Oropharynx is clear and moist.  Eyes: Conjunctivae are normal. Pupils are equal, round, and reactive to light.  Neck: Normal range of motion. Neck supple. No JVD present.  Cardiovascular: Normal rate, regular rhythm, S1 normal, S2 normal and intact distal pulses.  Exam reveals no gallop and no friction rub.   Murmur heard.  Crescendo systolic murmur is present with a grade of 1/6       Nonpitting edema  Pulmonary/Chest: Effort normal and breath sounds normal. No respiratory distress. She has no wheezes. She has no rales. She exhibits no tenderness.  Abdominal: Soft. Bowel sounds are normal. She exhibits no distension. There is no tenderness.  Musculoskeletal:  Normal range of motion. She exhibits no edema and no tenderness.  Lymphadenopathy:    She has no cervical adenopathy.  Neurological: She is alert and oriented to person, place, and time. Coordination normal.  Skin: Skin is warm and dry. No rash noted. No erythema.  Psychiatric: She has a normal mood and affect. Her behavior is normal. Judgment and thought content normal.         Assessment and Plan

## 2011-02-01 NOTE — Assessment & Plan Note (Signed)
Blood pressure is well controlled on today's visit. No changes made to the medications. 

## 2011-02-01 NOTE — Assessment & Plan Note (Signed)
Maintaining normal sinus rhythm on today's visit 

## 2011-02-01 NOTE — Patient Instructions (Signed)
You are doing well. No medication changes were made.  Please call us if you have new issues that need to be addressed before your next appt.  Your physician wants you to follow-up in: 6 months.  You will receive a reminder letter in the mail two months in advance. If you don't receive a letter, please call our office to schedule the follow-up appointment.   

## 2011-02-10 ENCOUNTER — Ambulatory Visit (INDEPENDENT_AMBULATORY_CARE_PROVIDER_SITE_OTHER): Payer: BC Managed Care – PPO | Admitting: Emergency Medicine

## 2011-02-10 ENCOUNTER — Telehealth: Payer: Self-pay

## 2011-02-10 DIAGNOSIS — I4892 Unspecified atrial flutter: Secondary | ICD-10-CM | POA: Diagnosis not present

## 2011-02-10 DIAGNOSIS — Z8672 Personal history of thrombophlebitis: Secondary | ICD-10-CM | POA: Diagnosis not present

## 2011-02-10 DIAGNOSIS — Z86718 Personal history of other venous thrombosis and embolism: Secondary | ICD-10-CM | POA: Diagnosis not present

## 2011-02-10 DIAGNOSIS — I4891 Unspecified atrial fibrillation: Secondary | ICD-10-CM

## 2011-02-10 DIAGNOSIS — Z7901 Long term (current) use of anticoagulants: Secondary | ICD-10-CM

## 2011-02-10 LAB — POCT INR: INR: 2.2

## 2011-02-10 MED ORDER — WARFARIN SODIUM 7.5 MG PO TABS: 7.5000 mg | ORAL_TABLET | ORAL | Status: DC

## 2011-02-10 NOTE — Telephone Encounter (Signed)
Refill sent for warfarin 7. 5 mg

## 2011-02-11 DIAGNOSIS — L219 Seborrheic dermatitis, unspecified: Secondary | ICD-10-CM | POA: Diagnosis not present

## 2011-02-11 DIAGNOSIS — L820 Inflamed seborrheic keratosis: Secondary | ICD-10-CM | POA: Diagnosis not present

## 2011-02-18 ENCOUNTER — Other Ambulatory Visit: Payer: Self-pay | Admitting: Cardiovascular Disease

## 2011-02-18 MED ORDER — WARFARIN SODIUM 7.5 MG PO TABS: 7.5000 mg | ORAL_TABLET | ORAL | Status: DC

## 2011-02-18 NOTE — Telephone Encounter (Signed)
90 day sent to [facility] with refills

## 2011-03-09 ENCOUNTER — Ambulatory Visit (INDEPENDENT_AMBULATORY_CARE_PROVIDER_SITE_OTHER): Payer: BC Managed Care – PPO | Admitting: Internal Medicine

## 2011-03-09 ENCOUNTER — Encounter: Payer: Self-pay | Admitting: Internal Medicine

## 2011-03-09 VITALS — BP 128/78 | HR 85 | Temp 97.3°F | Ht 64.0 in | Wt 279.0 lb

## 2011-03-09 DIAGNOSIS — R609 Edema, unspecified: Secondary | ICD-10-CM

## 2011-03-09 DIAGNOSIS — I10 Essential (primary) hypertension: Secondary | ICD-10-CM

## 2011-03-09 DIAGNOSIS — J9610 Chronic respiratory failure, unspecified whether with hypoxia or hypercapnia: Secondary | ICD-10-CM

## 2011-03-09 DIAGNOSIS — J449 Chronic obstructive pulmonary disease, unspecified: Secondary | ICD-10-CM | POA: Diagnosis not present

## 2011-03-09 NOTE — Patient Instructions (Addendum)
Please take "extra" lasix per day for the next 3 days On Friday, later this wk, please return to check on the potassium Please call the phone number [phone] (the [employer] System) for results of testing in 2-3 days;  When calling, simply dial the number, and when prompted enter the MRN number above (the Medical Record Number) and the # key, then the message should start.

## 2011-03-10 ENCOUNTER — Ambulatory Visit (INDEPENDENT_AMBULATORY_CARE_PROVIDER_SITE_OTHER): Payer: BC Managed Care – PPO | Admitting: *Deleted

## 2011-03-10 DIAGNOSIS — I4891 Unspecified atrial fibrillation: Secondary | ICD-10-CM | POA: Diagnosis not present

## 2011-03-10 DIAGNOSIS — Z8672 Personal history of thrombophlebitis: Secondary | ICD-10-CM | POA: Diagnosis not present

## 2011-03-10 DIAGNOSIS — Z86718 Personal history of other venous thrombosis and embolism: Secondary | ICD-10-CM | POA: Diagnosis not present

## 2011-03-10 DIAGNOSIS — I4892 Unspecified atrial flutter: Secondary | ICD-10-CM | POA: Diagnosis not present

## 2011-03-10 DIAGNOSIS — Z7901 Long term (current) use of anticoagulants: Secondary | ICD-10-CM

## 2011-03-10 LAB — POCT INR: INR: 2.7

## 2011-03-12 ENCOUNTER — Telehealth: Payer: Self-pay

## 2011-03-12 ENCOUNTER — Telehealth: Payer: Self-pay | Admitting: Internal Medicine

## 2011-03-12 ENCOUNTER — Other Ambulatory Visit (INDEPENDENT_AMBULATORY_CARE_PROVIDER_SITE_OTHER): Payer: BC Managed Care – PPO

## 2011-03-12 ENCOUNTER — Other Ambulatory Visit: Payer: Self-pay

## 2011-03-12 DIAGNOSIS — R7309 Other abnormal glucose: Secondary | ICD-10-CM

## 2011-03-12 DIAGNOSIS — R300 Dysuria: Secondary | ICD-10-CM

## 2011-03-12 DIAGNOSIS — R252 Cramp and spasm: Secondary | ICD-10-CM

## 2011-03-12 DIAGNOSIS — R7302 Impaired glucose tolerance (oral): Secondary | ICD-10-CM

## 2011-03-12 LAB — LIPID PANEL
Cholesterol: 142 mg/dL (ref 0–200)
HDL: 45.4 mg/dL (ref >39.00)
LDL Cholesterol: 77 mg/dL (ref 0–99)
Total CHOL/HDL Ratio: 3
Triglycerides: 100 mg/dL (ref 0.0–149.0)
VLDL: 20 mg/dL (ref 0.0–40.0)

## 2011-03-12 LAB — URINALYSIS, ROUTINE W REFLEX MICROSCOPIC
Bilirubin Urine: NEGATIVE
Ketones, ur: NEGATIVE
Nitrite: NEGATIVE
Specific Gravity, Urine: 1.015 (ref 1.000–1.030)
Total Protein, Urine: NEGATIVE
Urine Glucose: NEGATIVE
Urobilinogen, UA: 0.2 (ref 0.0–1.0)
pH: 7.5 (ref 5.0–8.0)

## 2011-03-12 LAB — BASIC METABOLIC PANEL
BUN: 14 mg/dL (ref 6–23)
CO2: 33 mEq/L — ABNORMAL HIGH (ref 19–32)
Calcium: 8.4 mg/dL (ref 8.4–10.5)
Chloride: 105 mEq/L (ref 96–112)
Creatinine, Ser: 0.9 mg/dL (ref 0.4–1.2)
GFR: 68.58 mL/min (ref >60.00)
Glucose, Bld: 62 mg/dL — ABNORMAL LOW (ref 70–99)
Potassium: 3.4 mEq/L — ABNORMAL LOW (ref 3.5–5.1)
Sodium: 144 mEq/L (ref 135–145)

## 2011-03-12 LAB — POTASSIUM: Potassium: 3.4 mEq/L — ABNORMAL LOW (ref 3.5–5.1)

## 2011-03-12 LAB — HEMOGLOBIN A1C: Hgb A1c MFr Bld: 4.7 % (ref 4.6–6.5)

## 2011-03-12 MED ORDER — CIPROFLOXACIN HCL 500 MG PO TABS: 500.0000 mg | ORAL_TABLET | Freq: Two times a day (BID) | ORAL | Status: DC

## 2011-03-12 MED ORDER — CIPROFLOXACIN HCL 500 MG PO TABS: 500.0000 mg | ORAL_TABLET | Freq: Two times a day (BID) | ORAL | Status: AC

## 2011-03-12 NOTE — Telephone Encounter (Signed)
Ok for cipro, may help ear as well, ok to also use mucinex prn

## 2011-03-12 NOTE — Telephone Encounter (Signed)
Patient informed. 

## 2011-03-12 NOTE — Telephone Encounter (Signed)
Message copied by Corwin Levins on Fri Mar 12, 2011  3:25 PM ------      Message from: Scharlene Gloss B      Created: Fri Mar 12, 2011  3:23 PM       Called the patient and she is having UTI symptoms (alot of pain) also stated her ears are now hurting. Please send prescription to [facility] [address]

## 2011-03-12 NOTE — Telephone Encounter (Signed)
Labs not entered of potassium. Pt also called c/o dysuria, UA ordered.

## 2011-03-14 ENCOUNTER — Encounter: Payer: Self-pay | Admitting: Internal Medicine

## 2011-03-14 NOTE — Assessment & Plan Note (Signed)
stable overall by hx and exam, most recent data reviewed with pt, and pt to continue medical treatment as before  SpO2 Readings from Last 3 Encounters:  03/09/11 93%  01/01/11 98%  10/14/10 97%

## 2011-03-14 NOTE — Assessment & Plan Note (Signed)
stable overall by hx and exam, most recent data reviewed with pt, and pt to continue medical treatment as before, no significant worsening edema on exam, to cont meds as is

## 2011-03-14 NOTE — Progress Notes (Signed)
Subjective:    Patient ID: [name] [name], female    DOB: 1950/07/31, 61 y.o.   MRN: [id number]  HPI  Here to f/u;  Pt is now enrolled in an insurance [employer] CHF/COD program with what sounds like a nurse calling her, found out her wt increased from 273 to 279 and urged her to be seen;  Pt does have slight cough and feels like she may be getting a "drowning" feeling, is afraid of having some kind of tx in Dr Windell Hummingbird office I think IV b/c is not done in the hosp setting, so came here.  Pt denies chest pain, increased sob or doe, wheezing, orthopnea, PND, increased LE swelling, palpitations, dizziness or syncope.  Pt denies new neurological symptoms such as new headache, or facial or extremity weakness or numbness   Pt denies polydipsia, polyuria.  Denies worsening depressive symptoms, suicidal ideation, or panic, though has ongoing anxiety. Past Medical History  Diagnosis Date  . Chronic rhinitis   . Edema   . Acute and chronic respiratory failure   . Morbid obesity     target wt=179lb for BMI<30 Peak wt 282lb  . DVT (deep venous thrombosis)   . COPD (chronic obstructive pulmonary disease)     Wert. PFTs 07/25/06 FEV1 55% ratio 53, DLC0 51% HFA 50% 12/16/2009  . Depression   . Osteoporosis     last BMD 4/08 -2.4, intolerant of bisphosphonates  . Hypertension   . Arrhythmia     atrial fibrillation, anticoagulation therapy  . Pulmonary embolism   . Anxiety   . Glucose intolerance (impaired glucose tolerance)     on steroids  . Hyperlipidemia   . VITAMIN D DEFICIENCY 10/27/2007  . HYPERLIPIDEMIA 06/19/2007  . PREMATURE VENTRICULAR CONTRACTIONS 10/23/2008  . Atrial flutter 07/01/2008  . Obstructive chronic bronchitis with exacerbation 01/19/2007  . PNEUMONIA ORGANISM NOS 01/09/2008  . UTI 08/06/2008  . WRIST PAIN, LEFT 11/27/2009  . ANKLE PAIN 03/28/2008  . Rhabdomyolysis 07/29/2009  . CRAMP IN LIMB 07/29/2009  . OSTEOPOROSIS 07/29/2006  . CHEST PAIN 09/16/2009  . Dysuria 07/22/2008  . FRACTURE,  RIB, RIGHT 06/18/2009  . PULMONARY EMBOLISM, HX OF 06/19/2007  . DEEP VENOUS THROMBOPHLEBITIS, HX OF 07/29/2006  . DYSPNEA 03/10/2010  . Chest pain   . Sinus infection    Past Surgical History  Procedure Date  . Abdominal hysterectomy   . Tonsillectomy   . Skin graft to middle r finger 1975  . S/p left arm fracture with fall off chair     reports that she quit smoking about 15 years ago. Her smoking use included Cigarettes. She has a 35 pack-year smoking history. She does not have any smokeless tobacco history on file. She reports that she does not drink alcohol or use illicit drugs. family history includes Asthma in her brother and son; Heart disease in her mother; and Throat cancer in her brother. Allergies  Allergen Reactions  . Duloxetine     REACTION: rhabdomyolysis  . Penicillins    Current Outpatient Prescriptions on File Prior to Visit  Medication Sig Dispense Refill  . acetaminophen (TYLENOL ARTHRITIS PAIN) 650 MG CR tablet Take as directed when needed for pain      . atorvastatin (LIPITOR) 10 MG tablet Take 1 tablet (10 mg total) by mouth at bedtime.      . Calcium Carbonate-Vitamin D 600-400 MG-UNIT per tablet Take 1 tablet by mouth 2 (two) times daily.        . Cetirizine HCl (ZYRTEC  ALLERGY) 10 MG CAPS Take 1 capsule (10 mg total) by mouth at bedtime.      . cholecalciferol (VITAMIN D) 1000 UNITS tablet Take 1,000 Units by mouth daily.        . clotrimazole-betamethasone (LOTRISONE) cream Use as directed twice per day as needed for rash  15 g  1  . dextromethorphan-guaiFENesin (MUCINEX DM) 30-600 MG per 12 hr tablet 1 every 12 hours as needed w/flutter for cough/congestion      . EXFORGE 10-320 MG per tablet TAKE 1 TABLET DAILY  90 tablet  3  . FLUoxetine (PROZAC) 40 MG capsule TAKE 1 CAPSULE DAILY  90 capsule  1  . furosemide (LASIX) 80 MG tablet Take 80 mg by mouth 2 (two) times daily. Take 1 tablet by mouth once a day and may add 1/2 extra once daily as needed      .  glucosamine-chondroitin 500-400 MG tablet Take 1 tablet by mouth daily.        Marland Kitchen levalbuterol (XOPENEX) 1.25 MG/3ML nebulizer solution Take 1 ampule by nebulization every 4 (four) hours as needed.        . mometasone (NASONEX) 50 MCG/ACT nasal spray Place 1 spray into the nose 2 (two) times daily.      . nitroGLYCERIN (NITROSTAT) 0.4 MG SL tablet Place 1 tablet (0.4 mg total) under the tongue every 5 (five) minutes as needed for chest pain.  25 tablet  3  . omeprazole (PRILOSEC) 20 MG capsule TAKE 1 CAPSULE 30 MINUTES BEFORE FIRST MEAL  90 capsule  3  . oxymetazoline (AFRIN) 0.05 % nasal spray Place 2 sprays into the nose 2 (two) times daily as needed for congestion. For 5 days before nasonex      . potassium chloride SA (K-DUR,KLOR-CON) 20 MEQ tablet Take 1 tablet (20 mEq total) by mouth 2 (two) times daily.      . propafenone (RYTHMOL SR) 325 MG 12 hr capsule Take 1 capsule (325 mg total) by mouth 2 (two) times daily.  180 capsule  3  . sodium chloride (OCEAN) 0.65 % SOLN nasal spray 2 puffs every 4 hours as needed for nasal congestion      . SYMBICORT 160-4.5 MCG/ACT inhaler INHALE 2 PUFFS TWICE A DAY  3 Inhaler  3  . traMADol (ULTRAM) 50 MG tablet Take 1 tablet (50 mg total) by mouth every 4 (four) hours as needed. For cough  30 tablet  0  . warfarin (COUMADIN) 7.5 MG tablet Take 1 tablet (7.5 mg total) by mouth as directed. Take as directed by coumadin clinic  120 tablet  1  . XOPENEX HFA 45 MCG/ACT inhaler INHALE 2 PUFFS EVERY 4 HOURS AS NEEDED  3 Inhaler  0   Review of Systems Review of Systems  Constitutional: Negative for diaphoresis and unexpected weight change.  HENT: Negative for drooling and tinnitus.   Eyes: Negative for photophobia and visual disturbance.  Respiratory: Negative for choking and stridor.   Gastrointestinal: Negative for vomiting and blood in stool.  Genitourinary: Negative for hematuria and decreased urine volume.        Objective:   Physical Exam BP 128/78   Pulse 85  Temp(Src) 97.3 F (36.3 C) (Oral)  Ht 5\' 4"  (1.626 m)  Wt 279 lb (126.554 kg)  BMI 47.89 kg/m2  SpO2 93% Physical Exam  VS noted, wearing o2 Constitutional: Pt appears well-developed and well-nourished.  HENT: Head: Normocephalic.  Right Ear: External ear normal.  Left Ear: External ear normal.  Eyes: Conjunctivae and EOM are normal. Pupils are equal, round, and reactive to light.  Neck: Normal range of motion. Neck supple.  Cardiovascular: Normal rate and regular rhythm.   Pulmonary/Chest: Effort normal and breath sounds decreased bilat, no rales or wheezing  Abd:  Soft, NT, non-distended, + BS Neurological: Pt is alert. No cranial nerve deficit.  Skin: Skin is warm. No erythema. LE edema trace bilat (somewhat increased from previous per pt) Psychiatric: Pt behavior is normal. Thought content normal.         Assessment & Plan:

## 2011-03-14 NOTE — Assessment & Plan Note (Addendum)
Slight increase  - for tid lasix for next 3 days, then resume prior dosing,  to f/u any worsening symptoms or concerns

## 2011-03-14 NOTE — Assessment & Plan Note (Signed)
stable overall by hx and exam, most recent data reviewed with pt, and pt to continue medical treatment as before  BP Readings from Last 3 Encounters:  03/09/11 128/78  02/01/11 126/70  01/01/11 146/82

## 2011-03-17 ENCOUNTER — Telehealth: Payer: Self-pay

## 2011-03-17 ENCOUNTER — Ambulatory Visit (INDEPENDENT_AMBULATORY_CARE_PROVIDER_SITE_OTHER): Payer: BC Managed Care – PPO

## 2011-03-17 DIAGNOSIS — I4892 Unspecified atrial flutter: Secondary | ICD-10-CM

## 2011-03-17 DIAGNOSIS — I4891 Unspecified atrial fibrillation: Secondary | ICD-10-CM | POA: Diagnosis not present

## 2011-03-17 DIAGNOSIS — Z8672 Personal history of thrombophlebitis: Secondary | ICD-10-CM | POA: Diagnosis not present

## 2011-03-17 DIAGNOSIS — Z7901 Long term (current) use of anticoagulants: Secondary | ICD-10-CM

## 2011-03-17 DIAGNOSIS — Z86718 Personal history of other venous thrombosis and embolism: Secondary | ICD-10-CM

## 2011-03-17 LAB — POCT INR: INR: 2.9

## 2011-03-17 NOTE — Telephone Encounter (Signed)
Pt states she was started on Cipro 500mg  bid x 10 days on 03/12/11.  Advised pt Cipro can increase the effects of Coumadin and cause INR to be elevated.  Advised pt we should check her Coumadin in office today as she is on day 5 of her 10 day course.  Pt states she has been feeling nauseated today and is afraid to leave the house right now.  Pt states she will try to get to feeling better and try to come into office this afternoon around 3pm.

## 2011-03-22 ENCOUNTER — Other Ambulatory Visit: Payer: Self-pay | Admitting: Cardiovascular Disease

## 2011-04-07 ENCOUNTER — Ambulatory Visit (INDEPENDENT_AMBULATORY_CARE_PROVIDER_SITE_OTHER): Payer: BC Managed Care – PPO

## 2011-04-07 DIAGNOSIS — I4891 Unspecified atrial fibrillation: Secondary | ICD-10-CM

## 2011-04-07 DIAGNOSIS — Z7901 Long term (current) use of anticoagulants: Secondary | ICD-10-CM

## 2011-04-07 DIAGNOSIS — I4892 Unspecified atrial flutter: Secondary | ICD-10-CM

## 2011-04-07 DIAGNOSIS — Z8672 Personal history of thrombophlebitis: Secondary | ICD-10-CM | POA: Diagnosis not present

## 2011-04-07 DIAGNOSIS — Z86718 Personal history of other venous thrombosis and embolism: Secondary | ICD-10-CM | POA: Diagnosis not present

## 2011-04-07 LAB — POCT INR: INR: 2.9

## 2011-04-13 ENCOUNTER — Ambulatory Visit: Payer: BC Managed Care – PPO | Admitting: Internal Medicine

## 2011-04-13 ENCOUNTER — Telehealth: Payer: Self-pay | Admitting: Internal Medicine

## 2011-04-13 DIAGNOSIS — E785 Hyperlipidemia, unspecified: Secondary | ICD-10-CM

## 2011-04-13 MED ORDER — ATORVASTATIN CALCIUM 10 MG PO TABS: 10.0000 mg | ORAL_TABLET | Freq: Every day | ORAL | Status: DC

## 2011-04-13 NOTE — Telephone Encounter (Signed)
PT USES [facility].  SHE NEEDS A NEW RX FOR ATORVASTATIN FOR CHOLESTEROL FAXED IN TO [facility].

## 2011-04-14 ENCOUNTER — Encounter: Payer: Self-pay | Admitting: Internal Medicine

## 2011-04-14 ENCOUNTER — Ambulatory Visit (INDEPENDENT_AMBULATORY_CARE_PROVIDER_SITE_OTHER): Payer: BC Managed Care – PPO | Admitting: Internal Medicine

## 2011-04-14 VITALS — BP 122/68 | HR 73 | Temp 98.2°F | Ht 64.0 in | Wt 278.2 lb

## 2011-04-14 DIAGNOSIS — I10 Essential (primary) hypertension: Secondary | ICD-10-CM

## 2011-04-14 DIAGNOSIS — Z0000 Encounter for general adult medical examination without abnormal findings: Secondary | ICD-10-CM | POA: Diagnosis not present

## 2011-04-14 DIAGNOSIS — E785 Hyperlipidemia, unspecified: Secondary | ICD-10-CM

## 2011-04-14 DIAGNOSIS — R7309 Other abnormal glucose: Secondary | ICD-10-CM

## 2011-04-14 DIAGNOSIS — R29898 Other symptoms and signs involving the musculoskeletal system: Secondary | ICD-10-CM

## 2011-04-14 DIAGNOSIS — R7302 Impaired glucose tolerance (oral): Secondary | ICD-10-CM

## 2011-04-14 MED ORDER — POTASSIUM CHLORIDE CRYS ER 20 MEQ PO TBCR: 20.0000 meq | EXTENDED_RELEASE_TABLET | Freq: Two times a day (BID) | ORAL | Status: DC

## 2011-04-14 MED ORDER — FLUOXETINE HCL 40 MG PO CAPS: 40.0000 mg | ORAL_CAPSULE | Freq: Every day | ORAL | Status: DC

## 2011-04-14 MED ORDER — PROPAFENONE HCL ER 325 MG PO CP12: 325.0000 mg | ORAL_CAPSULE | Freq: Two times a day (BID) | ORAL | Status: DC

## 2011-04-14 MED ORDER — ATORVASTATIN CALCIUM 10 MG PO TABS: 10.0000 mg | ORAL_TABLET | Freq: Every day | ORAL | Status: DC

## 2011-04-14 NOTE — Patient Instructions (Signed)
Continue all other medications as before Your refills will be sent to [facility] as reqeusted You will be contacted regarding the referral for: Head MRI to rule out recent stroke

## 2011-04-14 NOTE — Progress Notes (Signed)
Subjective:    Patient ID: [name] [name], female    DOB: Jun 04, 1950, 61 y.o.   MRN: [id number]  HPI  Here to f/u;  Exam per surgury recently felt RLE weakness not related to lumbar issue.  Pt denies chest pain, increased sob or doe, wheezing, orthopnea, PND, increased LE swelling, palpitations, dizziness or syncope.  Pt denies new neurological symptoms such as new headache, or facial or extremity weakness or numbness   Pt denies polydipsia, polyuria.   Pt denies fever, wt loss, night sweats, loss of appetite, or other constitutional symptoms  No recent falls.  Denies worsening depressive symptoms, suicidal ideation, or panic, though has ongoing anxiety.  Pt mentions holding on prolia due to cost as tx for osteoporosis Past Medical History  Diagnosis Date  . Chronic rhinitis   . Edema   . Acute and chronic respiratory failure   . Morbid obesity     target wt=179lb for BMI<30 Peak wt 282lb  . DVT (deep venous thrombosis)   . COPD (chronic obstructive pulmonary disease)     Wert. PFTs 07/25/06 FEV1 55% ratio 53, DLC0 51% HFA 50% 12/16/2009  . Depression   . Osteoporosis     last BMD 4/08 -2.4, intolerant of bisphosphonates  . Hypertension   . Arrhythmia     atrial fibrillation, anticoagulation therapy  . Pulmonary embolism   . Anxiety   . Glucose intolerance (impaired glucose tolerance)     on steroids  . Hyperlipidemia   . VITAMIN D DEFICIENCY 10/27/2007  . HYPERLIPIDEMIA 06/19/2007  . PREMATURE VENTRICULAR CONTRACTIONS 10/23/2008  . Atrial flutter 07/01/2008  . Obstructive chronic bronchitis with exacerbation 01/19/2007  . PNEUMONIA ORGANISM NOS 01/09/2008  . UTI 08/06/2008  . WRIST PAIN, LEFT 11/27/2009  . ANKLE PAIN 03/28/2008  . Rhabdomyolysis 07/29/2009  . CRAMP IN LIMB 07/29/2009  . OSTEOPOROSIS 07/29/2006  . CHEST PAIN 09/16/2009  . Dysuria 07/22/2008  . FRACTURE, RIB, RIGHT 06/18/2009  . PULMONARY EMBOLISM, HX OF 06/19/2007  . DEEP VENOUS THROMBOPHLEBITIS, HX OF 07/29/2006  . DYSPNEA  03/10/2010  . Chest pain   . Sinus infection    Past Surgical History  Procedure Date  . Abdominal hysterectomy   . Tonsillectomy   . Skin graft to middle r finger 1975  . S/p left arm fracture with fall off chair     reports that she quit smoking about 15 years ago. Her smoking use included Cigarettes. She has a 35 pack-year smoking history. She does not have any smokeless tobacco history on file. She reports that she does not drink alcohol or use illicit drugs. family history includes Asthma in her brother and son; Heart disease in her mother; and Throat cancer in her brother. Allergies  Allergen Reactions  . Duloxetine     REACTION: rhabdomyolysis  . Penicillins    Current Outpatient Prescriptions on File Prior to Visit  Medication Sig Dispense Refill  . acetaminophen (TYLENOL ARTHRITIS PAIN) 650 MG CR tablet Take as directed when needed for pain      . atorvastatin (LIPITOR) 10 MG tablet Take 1 tablet (10 mg total) by mouth at bedtime.  90 tablet  3  . Calcium Carbonate-Vitamin D 600-400 MG-UNIT per tablet Take 1 tablet by mouth 2 (two) times daily.        . Cetirizine HCl (ZYRTEC ALLERGY) 10 MG CAPS Take 1 capsule (10 mg total) by mouth at bedtime.      . cholecalciferol (VITAMIN D) 1000 UNITS tablet Take 1,000 Units  by mouth daily.        . clotrimazole-betamethasone (LOTRISONE) cream Use as directed twice per day as needed for rash  15 g  1  . dextromethorphan-guaiFENesin (MUCINEX DM) 30-600 MG per 12 hr tablet 1 every 12 hours as needed w/flutter for cough/congestion      . EXFORGE 10-320 MG per tablet TAKE 1 TABLET DAILY  90 tablet  3  . FLUoxetine (PROZAC) 40 MG capsule Take 1 capsule (40 mg total) by mouth daily.  90 capsule  3  . furosemide (LASIX) 80 MG tablet TAKE 1 TABLET TWICE A DAY  180 tablet  2  . glucosamine-chondroitin 500-400 MG tablet Take 1 tablet by mouth daily.        Marland Kitchen levalbuterol (XOPENEX) 1.25 MG/3ML nebulizer solution Take 1 ampule by nebulization every 4  (four) hours as needed.        . mometasone (NASONEX) 50 MCG/ACT nasal spray Place 1 spray into the nose 2 (two) times daily.      . nitroGLYCERIN (NITROSTAT) 0.4 MG SL tablet Place 1 tablet (0.4 mg total) under the tongue every 5 (five) minutes as needed for chest pain.  25 tablet  3  . omeprazole (PRILOSEC) 20 MG capsule TAKE 1 CAPSULE 30 MINUTES BEFORE FIRST MEAL  90 capsule  3  . oxymetazoline (AFRIN) 0.05 % nasal spray Place 2 sprays into the nose 2 (two) times daily as needed for congestion. For 5 days before nasonex      . potassium chloride SA (K-DUR,KLOR-CON) 20 MEQ tablet Take 1 tablet (20 mEq total) by mouth 2 (two) times daily.  180 tablet  3  . propafenone (RYTHMOL SR) 325 MG 12 hr capsule Take 1 capsule (325 mg total) by mouth 2 (two) times daily.  180 capsule  3  . sodium chloride (OCEAN) 0.65 % SOLN nasal spray 2 puffs every 4 hours as needed for nasal congestion      . SYMBICORT 160-4.5 MCG/ACT inhaler INHALE 2 PUFFS TWICE A DAY  3 Inhaler  3  . traMADol (ULTRAM) 50 MG tablet Take 1 tablet (50 mg total) by mouth every 4 (four) hours as needed. For cough  30 tablet  0  . warfarin (COUMADIN) 7.5 MG tablet Take 1 tablet (7.5 mg total) by mouth as directed. Take as directed by coumadin clinic  120 tablet  1  . XOPENEX HFA 45 MCG/ACT inhaler INHALE 2 PUFFS EVERY 4 HOURS AS NEEDED  3 Inhaler  0   Review of Systems Review of Systems  Constitutional: Negative for diaphoresis and unexpected weight change.  Gastrointestinal: Negative for vomiting and blood in stool.  Genitourinary: Negative for hematuria and decreased urine volume.  Musculoskeletal: Negative for gait problem.  Skin: Negative for color change and wound.  Neurological: Negative for tremors and numbness.  Psychiatric/Behavioral: Negative for decreased concentration. The patient is not hyperactive.       Objective:   Physical Exam BP 122/68  Pulse 73  Temp(Src) 98.2 F (36.8 C) (Oral)  Ht 5\' 4"  (1.626 m)  Wt 278 lb  4 oz (126.213 kg)  BMI 47.76 kg/m2  SpO2 96% Physical Exam  VS noted Constitutional: Pt appears well-developed and well-nourished.  HENT: Head: Normocephalic.  Right Ear: External ear normal.  Left Ear: External ear normal.  Eyes: Conjunctivae and EOM are normal. Pupils are equal, round, and reactive to light.  Neck: Normal range of motion. Neck supple.  Cardiovascular: Normal rate and regular rhythm.   Pulmonary/Chest: Effort normal  and breath sounds normal.  Abd:  Soft, NT, non-distended, + BS Neurological: Pt is alert. No cranial nerve deficit. RLE distal 4+/5 strength Skin: Skin is warm. No erythema. No rash Psychiatric: Pt behavior is normal. Thought content normal. 1+ nervous     Assessment & Plan:

## 2011-04-18 ENCOUNTER — Encounter: Payer: Self-pay | Admitting: Internal Medicine

## 2011-04-18 NOTE — Assessment & Plan Note (Signed)
stable overall by hx and exam, most recent data reviewed with pt, and pt to continue medical treatment as before  BP Readings from Last 3 Encounters:  04/14/11 122/68  03/09/11 128/78  02/01/11 126/70

## 2011-04-18 NOTE — Assessment & Plan Note (Signed)
Not related to lumbar issue per surgury, will check head MRI, consider neuro consult as well,  to f/u any worsening symptoms or concerns

## 2011-04-18 NOTE — Assessment & Plan Note (Signed)
stable overall by hx and exam, most recent data reviewed with pt, and pt to continue medical treatment as before Lab Results  Component Value Date   HGBA1C 4.7 03/12/2011    

## 2011-04-18 NOTE — Assessment & Plan Note (Signed)
stable overall by hx and exam, most recent data reviewed with pt, and pt to continue medical treatment as before  Lab Results  Component Value Date   LDLCALC 77 03/12/2011

## 2011-04-19 ENCOUNTER — Ambulatory Visit
Admission: RE | Admit: 2011-04-19 | Discharge: 2011-04-19 | Disposition: A | Discharge status: Home / Self Care | Payer: BC Managed Care – PPO | Source: Ambulatory Visit | Attending: Internal Medicine | Admitting: Internal Medicine

## 2011-04-19 DIAGNOSIS — R29898 Other symptoms and signs involving the musculoskeletal system: Secondary | ICD-10-CM

## 2011-04-20 ENCOUNTER — Encounter: Payer: Self-pay | Admitting: Internal Medicine

## 2011-04-29 ENCOUNTER — Ambulatory Visit (INDEPENDENT_AMBULATORY_CARE_PROVIDER_SITE_OTHER): Payer: BC Managed Care – PPO | Admitting: Internal Medicine

## 2011-04-29 ENCOUNTER — Encounter: Payer: Self-pay | Admitting: Internal Medicine

## 2011-04-29 VITALS — BP 126/64 | HR 71 | Temp 98.0°F | Ht 64.0 in | Wt 279.8 lb

## 2011-04-29 DIAGNOSIS — J9610 Chronic respiratory failure, unspecified whether with hypoxia or hypercapnia: Secondary | ICD-10-CM

## 2011-04-29 DIAGNOSIS — J449 Chronic obstructive pulmonary disease, unspecified: Secondary | ICD-10-CM | POA: Diagnosis not present

## 2011-04-29 MED ORDER — OMEPRAZOLE 20 MG PO CPDR: 20.0000 mg | DELAYED_RELEASE_CAPSULE | Freq: Every day | ORAL | Status: DC

## 2011-04-29 NOTE — Progress Notes (Signed)
Subjective:    Patient ID: [name] [name], female    DOB: February 10, 1950, 61 y.o.   MRN: [id number]  HPI  60 yowf last smoked 2003 with COPD with minimum asthmatic component with an FEV1 of 55%, a ratio of 53%, and a diffusing capacity 51% documented 07/25/06. On 02 24 hours per day at 2lpm  6/09 weighed 271 and it was felt she was as debilitated as much by obesity as by airflow obstruction.  baseline = when well = walk to mother in law's house but stops at least once because it's uphill and frequently requiring rescue rx.    March 19, 2010-- Post hospital visit. Admitted 2/28-03/16/10( dc wt at 269 which was about 10 lbs down) for Acute on chronic resp failure secondary to decompensated diastolic heart failure . She was tx with aggressive diuresis. Conintued on coumadin. INR yesterday was 2.4. 2 D echo showed EF 55% w/ gr 2 diastolic dysfunction . VQ scan with low probability for PE. Since discharge some better but still real weak. Cough and congestion are some better. Edema worse this am, took with lasix that helped. no change in rx   09/29/2010 f/u ov/Vin Yonke cc doe x has to stop x one or twice walking mailbox on 2lpm but overall feels this is marked improvement, still not able to lose wt.  No significant cough rec Only use your albuterol as a rescue medication to be used if you can't catch your breath by resting or doing a relaxed purse lip breathing pattern. The less you use it, the better it will work when you need it.   See calendar for specific medication instructions and bring it back for each and every office visit   Changed zyrtec to as needed only    01/01/2011 f/u ov/Ferrin Liebig using med calendar well but not following contingencies cc increase doe and leg  swelling since last ov doesn't think 2lpm at rest is enough, uses 3lpm with activity- no cough or variability. Some better p saba but mostly using it daytime, not ever more than twice daily. rec See calendar for specific medication instructions    Separating the top medications from the bottom group is fundamental to providing you adequate care going forward.   Agree you should use an extra potassium when you take an extra as needed furosemide as per Dr Jonny Ruiz and Gholan's recommendation.  04/29/2011 f/u ov/Conda Wannamaker cc no limiting sob, more legs R leg x 2 months, no cough.  Sleeping ok without nocturnal  or early am exacerbation  of respiratory  c/o's or need for noct saba. Also denies any obvious fluctuation of symptoms with weather or environmental changes or other aggravating or alleviating factors except as outlined above   ROS  At present neg for  any significant sore throat, dysphagia, itching, sneezing,  nasal congestion or excess/ purulent secretions,  fever, chills, sweats, unintended wt loss, pleuritic or exertional cp, hempoptysis, orthopnea pnd.  Also denies presyncope, palpitations, heartburn, abdominal pain, nausea, vomiting, diarrhea  or change in bowel or urinary habits, dysuria,hematuria,  rash, arthralgias, visual complaints, headache, numbness weakness or ataxia.         Past Medical History:  CHRONIC RHINITIS (ICD-472.0)  EDEMA (ICD-782.3)  ACUTE AND CHRONIC RESPIRATORY FAILURE (ICD-518.84)  MORBID OBESITY (ICD-278.01)  - Target wt = 179 for BMI < 30 peak wt 282  - Referred back to nutrition December 18, 2007  DEEP VENOUS THROMBOPHLEBITIS, HX OF (ICD-V12.52)  COPD (ICD-496)..........................................................Marland KitchenWert  - PFTs 07/25/06 FEV1 55% ratio  53, DLC0 51%  - HFA 50% December 16, 2009 > 75% March 27, 2010  DEPRESSION (ICD-311)  OSTEOPOROSIS (ICD-733.00) last BMD 4/08 -2.4, intolerant of bisphosphonates  HYPERTENSION (ICD-401.9)  Atrial fibrillation - paroxysmal  Anticoagulation therapy  Pulmonary embolism, hx of  neg stress myoview 10/07  Anxiety  glucose intolerance - on steroids  Hyperlipidemia  Complex medical regimen  - Med calendar done 05/01/2008, 08/26/2010                Objective:   Physical Exam  GEN: A/Ox3; pleasant , NAD, obese   Wt  274 09/01/10 >   09/29/2010  275 > 01/01/2011  280 > 04/29/2011  279   HEENT:  [place]/AT,  EACs-clear, TMs-wnl, NOSE-clear drainage, THROAT-clear, no lesions, no postnasal drip or exudate noted.   NECK:  Supple w/ fair ROM; no JVD; normal carotid impulses w/o bruits; no thyromegaly or nodules palpated; no lymphadenopathy.  RESP  Clear  P & A; w/o, wheezes/ rales/ or rhonchi.no accessory muscle use, no dullness to percussion  CARD:  RRR, no m/r/g  , tr peripheral edema, pulses intact, no cyanosis or clubbing.  GI:   Soft & nt; nml bowel sounds; no organomegaly or masses detected.  Musco: Warm bil, no deformities or joint swelling noted.   Neuro: alert, no focal deficits noted.    Skin: Warm, no lesions or rashes         Assessment & Plan:

## 2011-04-29 NOTE — Patient Instructions (Signed)
See calendar for specific medication instructions and bring it back for each and every office visit for every healthcare provider you see.  Without it,  you may not receive the best quality medical care that we feel you deserve.  You will note that the calendar groups together  your maintenance  medications that are timed at particular times of the day.  Think of this as your checklist for what your doctor has instructed you to do until your next evaluation to see what benefit  there is  to staying on a consistent group of medications intended to keep you well.  The other group at the bottom is entirely up to you to use as you see fit  for specific symptoms that may arise between visits that require you to treat them on an as needed basis.  Think of this as your action plan or "what if" list.   Separating the top medications from the bottom group is fundamental to providing you adequate care going forward.     If you are satisfied with your treatment plan let your doctor know and he/she can either refill your medications or you can return here when your prescription runs out.     If in any way you are not 100% satisfied,  please tell us.  If 100% better, tell your friends!  

## 2011-04-29 NOTE — Assessment & Plan Note (Signed)
-   PFTs 07/25/06 FEV1 55% ratio 53, DLC0 51%  - HFA 75% 04/29/2011   Adequate control on present rx, reviewed complex rx    Each maintenance medication was reviewed in detail including most importantly the difference between maintenance and as needed and under what circumstances the prns are to be used. This was done in the context of a medication calendar review which provided the patient with a user-friendly unambiguous mechanism for medication administration and reconciliation and provides an action plan for all active problems. It is critical that this be shown to every doctor  for modification during the office visit if necessary so the patient can use it as a working document.      The proper method of use, as well as anticipated side effects, of a metered-dose inhaler are discussed and demonstrated to the patient. Improved effectiveness after extensive coaching during this visit to a level of approximately  75%

## 2011-04-29 NOTE — Assessment & Plan Note (Signed)
Adequate control on present rx, reviewed rx 

## 2011-05-05 ENCOUNTER — Ambulatory Visit (INDEPENDENT_AMBULATORY_CARE_PROVIDER_SITE_OTHER): Payer: BC Managed Care – PPO

## 2011-05-05 DIAGNOSIS — I4891 Unspecified atrial fibrillation: Secondary | ICD-10-CM

## 2011-05-05 DIAGNOSIS — I4892 Unspecified atrial flutter: Secondary | ICD-10-CM

## 2011-05-05 DIAGNOSIS — Z7901 Long term (current) use of anticoagulants: Secondary | ICD-10-CM

## 2011-05-05 DIAGNOSIS — Z86718 Personal history of other venous thrombosis and embolism: Secondary | ICD-10-CM

## 2011-05-05 DIAGNOSIS — Z8672 Personal history of thrombophlebitis: Secondary | ICD-10-CM

## 2011-05-05 LAB — POCT INR: INR: 3.2

## 2011-05-20 ENCOUNTER — Ambulatory Visit (INDEPENDENT_AMBULATORY_CARE_PROVIDER_SITE_OTHER): Payer: BC Managed Care – PPO

## 2011-05-20 DIAGNOSIS — M810 Age-related osteoporosis without current pathological fracture: Secondary | ICD-10-CM

## 2011-05-20 MED ORDER — DENOSUMAB 60 MG/ML ~~LOC~~ SOLN
60.0000 mg | Freq: Once | SUBCUTANEOUS | Status: AC
  Administered 2011-05-20: 60 mg via SUBCUTANEOUS

## 2011-06-02 ENCOUNTER — Ambulatory Visit (INDEPENDENT_AMBULATORY_CARE_PROVIDER_SITE_OTHER): Payer: BC Managed Care – PPO

## 2011-06-02 DIAGNOSIS — I4891 Unspecified atrial fibrillation: Secondary | ICD-10-CM | POA: Diagnosis not present

## 2011-06-02 DIAGNOSIS — I4892 Unspecified atrial flutter: Secondary | ICD-10-CM

## 2011-06-02 DIAGNOSIS — Z86718 Personal history of other venous thrombosis and embolism: Secondary | ICD-10-CM

## 2011-06-02 DIAGNOSIS — Z8672 Personal history of thrombophlebitis: Secondary | ICD-10-CM

## 2011-06-02 DIAGNOSIS — Z7901 Long term (current) use of anticoagulants: Secondary | ICD-10-CM

## 2011-06-02 LAB — POCT INR: INR: 2

## 2011-06-30 ENCOUNTER — Ambulatory Visit (INDEPENDENT_AMBULATORY_CARE_PROVIDER_SITE_OTHER): Payer: BC Managed Care – PPO

## 2011-06-30 DIAGNOSIS — I4892 Unspecified atrial flutter: Secondary | ICD-10-CM | POA: Diagnosis not present

## 2011-06-30 DIAGNOSIS — Z8672 Personal history of thrombophlebitis: Secondary | ICD-10-CM | POA: Diagnosis not present

## 2011-06-30 DIAGNOSIS — I4891 Unspecified atrial fibrillation: Secondary | ICD-10-CM

## 2011-06-30 DIAGNOSIS — Z7901 Long term (current) use of anticoagulants: Secondary | ICD-10-CM

## 2011-06-30 DIAGNOSIS — Z86718 Personal history of other venous thrombosis and embolism: Secondary | ICD-10-CM | POA: Diagnosis not present

## 2011-06-30 LAB — POCT INR: INR: 2.1

## 2011-07-28 ENCOUNTER — Ambulatory Visit (INDEPENDENT_AMBULATORY_CARE_PROVIDER_SITE_OTHER): Payer: BC Managed Care – PPO

## 2011-07-28 DIAGNOSIS — I4892 Unspecified atrial flutter: Secondary | ICD-10-CM | POA: Diagnosis not present

## 2011-07-28 DIAGNOSIS — Z86718 Personal history of other venous thrombosis and embolism: Secondary | ICD-10-CM

## 2011-07-28 DIAGNOSIS — I4891 Unspecified atrial fibrillation: Secondary | ICD-10-CM

## 2011-07-28 DIAGNOSIS — Z7901 Long term (current) use of anticoagulants: Secondary | ICD-10-CM

## 2011-07-28 DIAGNOSIS — Z8672 Personal history of thrombophlebitis: Secondary | ICD-10-CM | POA: Diagnosis not present

## 2011-07-28 LAB — POCT INR: INR: 2

## 2011-07-28 MED ORDER — WARFARIN SODIUM 7.5 MG PO TABS: 7.5000 mg | ORAL_TABLET | ORAL | Status: DC

## 2011-08-27 ENCOUNTER — Telehealth: Payer: Self-pay | Admitting: Internal Medicine

## 2011-08-27 NOTE — Telephone Encounter (Signed)
Form placed in [facility] look-at to sign. Please give to nurse once signed.Carron Curie, CMA

## 2011-08-31 NOTE — Telephone Encounter (Signed)
Form signed and mailed back to pt.

## 2011-09-02 ENCOUNTER — Telehealth: Payer: Self-pay | Admitting: Cardiovascular Disease

## 2011-09-02 NOTE — Telephone Encounter (Signed)
Pt calling stating that her [facility] nurse called her and thinks that pt needs a beta blocker. Pt also says that she is having a feeling of drowning.

## 2011-09-02 NOTE — Telephone Encounter (Signed)
Pt says she has a Agricultural consultant that call her on a routine basis to check on her. She says nurse suggested pt ask MD about starting beta blocker.  I explained, b/c of pt's pulmonary history, a beta blocker may be contraindicated for pt. I am unclear as to why this was suggested for pt.  Pt goes on to say she has been having episodes of orthopnea and SOB. Says this has been going on for "a while".  She has appt 8/28 for coumadin check and wonders if she can be seen at that time by MD.  She is overdue for routine f/u. She denies worsening sob at this time, but says symptoms have been ongoing.  She continues to take lasix 80 mg BID as directed.  I explained Dr. Mariah Milling is on vacation next week but offered to work her in with Dr. Elease Hashimoto 8/28 while here for coumadin check.  She is willing to see him. Appt made.  Again, pt confirms she is in no active distress and will call us sooner than appt if she feels she needs to be seen sooner.

## 2011-09-08 ENCOUNTER — Ambulatory Visit (INDEPENDENT_AMBULATORY_CARE_PROVIDER_SITE_OTHER): Payer: BC Managed Care – PPO | Admitting: Cardiovascular Disease

## 2011-09-08 ENCOUNTER — Ambulatory Visit (INDEPENDENT_AMBULATORY_CARE_PROVIDER_SITE_OTHER): Payer: BC Managed Care – PPO

## 2011-09-08 ENCOUNTER — Encounter: Payer: Self-pay | Admitting: Cardiovascular Disease

## 2011-09-08 VITALS — BP 132/76 | HR 88 | Ht 64.0 in | Wt 278.2 lb

## 2011-09-08 DIAGNOSIS — R0989 Other specified symptoms and signs involving the circulatory and respiratory systems: Secondary | ICD-10-CM

## 2011-09-08 DIAGNOSIS — Z8672 Personal history of thrombophlebitis: Secondary | ICD-10-CM | POA: Diagnosis not present

## 2011-09-08 DIAGNOSIS — R0609 Other forms of dyspnea: Secondary | ICD-10-CM

## 2011-09-08 DIAGNOSIS — R079 Chest pain, unspecified: Secondary | ICD-10-CM

## 2011-09-08 DIAGNOSIS — I4891 Unspecified atrial fibrillation: Secondary | ICD-10-CM

## 2011-09-08 DIAGNOSIS — Z86718 Personal history of other venous thrombosis and embolism: Secondary | ICD-10-CM | POA: Diagnosis not present

## 2011-09-08 DIAGNOSIS — I4892 Unspecified atrial flutter: Secondary | ICD-10-CM

## 2011-09-08 DIAGNOSIS — Z7901 Long term (current) use of anticoagulants: Secondary | ICD-10-CM

## 2011-09-08 LAB — POCT INR: INR: 2.2

## 2011-09-08 NOTE — Assessment & Plan Note (Signed)
[name] [name] presents today for evaluation of some worsening dyspnea, runny nose, headache, and watery eyes. She's also had some sneezing. She thought that he was she was coming down with a viral  URI  and I agree with her.  She presented to the cardiology office today because her [facility] nurse told that she should see the cardiologist and that she should be on beta blocker.  I don't think that any of her symptoms are related to her diastolic dysfunction. She has specifically not on beta blocker because of her history of  lung disease with some degree of COPD.    I do not want to change any of her medications. I'll have her return in 6 months to see Dr. Mariah Milling.

## 2011-09-08 NOTE — Progress Notes (Signed)
Chana Bode Date of Birth  1950-02-08       [facility]    [place] [address]  [address] [place], [place]  [address]   [place], [place]  [address] [phone]     [phone]   Fax  [phone]    Fax [phone]  Problem List: 1. Atrial fibrillation 2. Pulmonary embolus-on chronic Coumadin therapy 3. Chronic diastolic congestive heart failure 4. COPD 5. Morbid obesity   History of Present Illness: Mrs. Stanke is a pleasant 61 year old woman with a history of smoking for 35 years, stopped 10 years ago, history of atrial fibrillation dating back to 2010, history of PE on chronic Coumadin, hospitalization for acute on chronic respiratory failure felt secondary to diastolic dysfunction, morbid obesity and COPD. She presents today for some worsening chest pain  She  coming down with a cold.  She complains of watery eyes, runny nose, head ache, sneezing.    Current Outpatient Prescriptions on File Prior to Visit  Medication Sig Dispense Refill  . acetaminophen (TYLENOL ARTHRITIS PAIN) 650 MG CR tablet Take as directed when needed for pain      . atorvastatin (LIPITOR) 10 MG tablet Take 1 tablet (10 mg total) by mouth at bedtime.  90 tablet  3  . Calcium Carbonate-Vitamin D 600-400 MG-UNIT per tablet Take 1 tablet by mouth 2 (two) times daily.        . Cetirizine HCl (ZYRTEC ALLERGY) 10 MG CAPS Take 1 capsule (10 mg total) by mouth at bedtime.      . cholecalciferol (VITAMIN D) 1000 UNITS tablet Take 1,000 Units by mouth daily.        . clotrimazole-betamethasone (LOTRISONE) cream Use as directed twice per day as needed for rash  15 g  1  . dextromethorphan-guaiFENesin (MUCINEX DM) 30-600 MG per 12 hr tablet 1 every 12 hours as needed w/flutter for cough/congestion      . EXFORGE 10-320 MG per tablet TAKE 1 TABLET DAILY  90 tablet  3  . FLUoxetine (PROZAC) 40 MG capsule Take 1 capsule (40 mg total) by mouth daily.  90 capsule  3    . furosemide (LASIX) 80 MG tablet TAKE 1 TABLET TWICE A DAY  180 tablet  2  . glucosamine-chondroitin 500-400 MG tablet Take 1 tablet by mouth daily.        Marland Kitchen levalbuterol (XOPENEX) 1.25 MG/3ML nebulizer solution Take 1 ampule by nebulization every 4 (four) hours as needed.        . mometasone (NASONEX) 50 MCG/ACT nasal spray Place 1 spray into the nose 2 (two) times daily.      . nitroGLYCERIN (NITROSTAT) 0.4 MG SL tablet Place 1 tablet (0.4 mg total) under the tongue every 5 (five) minutes as needed for chest pain.  25 tablet  3  . omeprazole (PRILOSEC) 20 MG capsule Take 1 capsule (20 mg total) by mouth daily.  90 capsule  3  . oxymetazoline (AFRIN) 0.05 % nasal spray Place 2 sprays into the nose 2 (two) times daily as needed for congestion. For 5 days before nasonex      . potassium chloride SA (K-DUR,KLOR-CON) 20 MEQ tablet Take 1 tablet (20 mEq total) by mouth 2 (two) times daily.  180 tablet  3  . propafenone (RYTHMOL SR) 325 MG 12 hr capsule Take 1 capsule (325 mg total) by mouth 2 (two) times daily.  180 capsule  3  . sodium chloride (OCEAN)  0.65 % SOLN nasal spray 2 puffs every 4 hours as needed for nasal congestion      . SYMBICORT 160-4.5 MCG/ACT inhaler INHALE 2 PUFFS TWICE A DAY  3 Inhaler  3  . traMADol (ULTRAM) 50 MG tablet Take 1 tablet (50 mg total) by mouth every 4 (four) hours as needed. For cough  30 tablet  0  . warfarin (COUMADIN) 7.5 MG tablet Take 1 tablet (7.5 mg total) by mouth as directed. Take as directed by coumadin clinic  120 tablet  1  . XOPENEX HFA 45 MCG/ACT inhaler INHALE 2 PUFFS EVERY 4 HOURS AS NEEDED  3 Inhaler  0    Allergies  Allergen Reactions  . Duloxetine     REACTION: rhabdomyolysis  . Penicillins     Past Medical History  Diagnosis Date  . Chronic rhinitis   . Edema   . Acute and chronic respiratory failure   . Morbid obesity     target wt=179lb for BMI<30 Peak wt 282lb  . DVT (deep venous thrombosis)   . COPD (chronic obstructive  pulmonary disease)     Wert. PFTs 07/25/06 FEV1 55% ratio 53, DLC0 51% HFA 50% 12/16/2009  . Depression   . Osteoporosis     last BMD 4/08 -2.4, intolerant of bisphosphonates  . Hypertension   . Arrhythmia     atrial fibrillation, anticoagulation therapy  . Pulmonary embolism   . Anxiety   . Glucose intolerance (impaired glucose tolerance)     on steroids  . Hyperlipidemia   . VITAMIN D DEFICIENCY 10/27/2007  . HYPERLIPIDEMIA 06/19/2007  . PREMATURE VENTRICULAR CONTRACTIONS 10/23/2008  . Atrial flutter 07/01/2008  . Obstructive chronic bronchitis with exacerbation 01/19/2007  . PNEUMONIA ORGANISM NOS 01/09/2008  . UTI 08/06/2008  . WRIST PAIN, LEFT 11/27/2009  . ANKLE PAIN 03/28/2008  . Rhabdomyolysis 07/29/2009  . CRAMP IN LIMB 07/29/2009  . OSTEOPOROSIS 07/29/2006  . CHEST PAIN 09/16/2009  . Dysuria 07/22/2008  . FRACTURE, RIB, RIGHT 06/18/2009  . PULMONARY EMBOLISM, HX OF 06/19/2007  . DEEP VENOUS THROMBOPHLEBITIS, HX OF 07/29/2006  . DYSPNEA 03/10/2010  . Chest pain   . Sinus infection     Past Surgical History  Procedure Date  . Abdominal hysterectomy   . Tonsillectomy   . Skin graft to middle r finger 1975  . S/p left arm fracture with fall off chair     History  Smoking status  . Former Smoker -- 1.0 packs/day for 35 years  . Types: Cigarettes  . Quit date: 01/12/1996  Smokeless tobacco  . Not on file    History  Alcohol Use No    Family History  Problem Relation Age of Onset  . Heart disease Mother   . Asthma Brother   . Throat cancer Brother   . Asthma Son     Reviw of Systems:  Reviewed in the HPI.  All other systems are negative.  Physical Exam: Blood pressure 132/76, pulse 88, height 5\' 4"  (1.626 m), weight 278 lb 4 oz (126.213 kg). General: Well developed, well nourished, in no acute distress. Obesity.  Head: Normocephalic, atraumatic, sclera non-icteric, mucus membranes are moist,   Neck: Supple. Carotids are 2 + without bruits. No JVD  Lungs: faint  wheezing anteriorly  Heart: regular rate.  normal  S1 S2. No murmurs, gallops or rubs.  Abdomen: Soft, non-tender, non-distended with normal bowel sounds. No hepatomegaly. No rebound/guarding. No masses.  Msk:  Strength and tone are normal  Extremities: No clubbing  or cyanosis. No edema.  Distal pedal pulses are 2+ and equal bilaterally.  Neuro: Alert and oriented X 3. Moves all extremities spontaneously.  Psych:  Responds to questions appropriately with a normal affect.  ECG: September 08, 2011 - NSR, RVH, NS St abnormality  Assessment / Plan:

## 2011-09-08 NOTE — Patient Instructions (Signed)
Your physician wants you to follow-up in: 6 months with Dr. Gollan. You will receive a reminder letter in the mail two months in advance. If you don't receive a letter, please call our office to schedule the follow-up appointment.  

## 2011-09-09 ENCOUNTER — Telehealth: Payer: Self-pay

## 2011-09-09 NOTE — Telephone Encounter (Signed)
Called Dr Ramon Dredge Scott's office spoke with Karoline Caldwell, she asked Dr Lorin Picket what he preferred INR to be for upcoming dental extraction. He would like INR to be less that 3.0. Advised pt's INR has been between 2.2-2.0 x the past 3 months consistently.  Advised I checked pt's INR on 09/08/11 and INR was 2.2, result acceptable per DDS.  Result faxed to Dr Roby Lofts office, no need for pt to hold Coumadin.

## 2011-09-09 NOTE — Telephone Encounter (Signed)
Pt called states she has a tooth she needs extracted and needs clearance faxed over to dental office that it's OK for her to hold Coumadin and have tooth extracted.  Asked pt how many days DDS wanted pt to hold Coumadin, she states he left that up to [place]. Advised pt we do not make that call, if it were up to [place] we would not hold Coumadin.  We do not know bleeding risk associated with extraction, whether it's a simple extraction or surgical extraction.  Pt's DDS is Dr Geryl Councilman [phone].  Advised pt I would call DDS office and inquire about what he would like pt's INR to be, and then we could determine # of days needed to hold Coumadin.

## 2011-09-09 NOTE — Telephone Encounter (Signed)
Returned call to pt advised per Dr Ramon Dredge Scott's office INR needs to be less than 3.0.  Advised of last INR reading yesterday, no need to hold Coumadin INR is well under 3.0. Pt is aware last INR faxed to DDS office [phone].

## 2011-09-14 ENCOUNTER — Encounter: Payer: Self-pay | Admitting: Internal Medicine

## 2011-09-14 ENCOUNTER — Ambulatory Visit (INDEPENDENT_AMBULATORY_CARE_PROVIDER_SITE_OTHER): Payer: BC Managed Care – PPO | Admitting: Internal Medicine

## 2011-09-14 VITALS — BP 130/80 | HR 82 | Temp 98.1°F | Ht 64.0 in | Wt 283.0 lb

## 2011-09-14 DIAGNOSIS — J309 Allergic rhinitis, unspecified: Secondary | ICD-10-CM

## 2011-09-14 DIAGNOSIS — J069 Acute upper respiratory infection, unspecified: Secondary | ICD-10-CM | POA: Diagnosis not present

## 2011-09-14 DIAGNOSIS — H1030 Unspecified acute conjunctivitis, unspecified eye: Secondary | ICD-10-CM

## 2011-09-14 DIAGNOSIS — J449 Chronic obstructive pulmonary disease, unspecified: Secondary | ICD-10-CM

## 2011-09-14 DIAGNOSIS — J4489 Other specified chronic obstructive pulmonary disease: Secondary | ICD-10-CM

## 2011-09-14 DIAGNOSIS — H1032 Unspecified acute conjunctivitis, left eye: Secondary | ICD-10-CM

## 2011-09-14 MED ORDER — AZITHROMYCIN 250 MG PO TABS: ORAL_TABLET | ORAL | Status: AC

## 2011-09-14 MED ORDER — METHYLPREDNISOLONE ACETATE 80 MG/ML IJ SUSP
120.0000 mg | Freq: Once | INTRAMUSCULAR | Status: AC
  Administered 2011-09-14: 120 mg via INTRAMUSCULAR

## 2011-09-14 MED ORDER — TOBRAMYCIN 0.3 % OP SOLN: 1.0000 [drp] | Freq: Four times a day (QID) | OPHTHALMIC | Status: AC

## 2011-09-14 NOTE — Assessment & Plan Note (Signed)
Mild to mod, for antibx course,  to f/u any worsening symptoms or concerns 

## 2011-09-14 NOTE — Assessment & Plan Note (Signed)
Mild to mod, for depomedrol IM,  to f/u any worsening symptoms or concerns 

## 2011-09-14 NOTE — Patient Instructions (Addendum)
You had the steroid shot today Take all new medications as prescribed - the eye drop and pill antibiotic Continue all other medications as before Please have the pharmacy call with any refills you may need.

## 2011-09-14 NOTE — Assessment & Plan Note (Signed)
Mild to mod, for antibx course,  to f/u any worsening symptoms or concerns - zpack

## 2011-09-14 NOTE — Progress Notes (Signed)
Subjective:    Patient ID: [name] [name], female    DOB: 24-Jan-1950, 61 y.o.   MRN: [id number]  HPI Here after recent s/s viral URI, but in the past 3 days with marked increased pain/red/swelling of the left upper and lower eyelids with conjunvtival erythema and d/c;  Also  Here with 3 days acute onset fever, facial pain, pressure, general weakness and malaise, and greenish d/c, with slight ST, but little to no cough and Pt denies chest pain, increased sob or doe, wheezing, orthopnea, PND, increased LE swelling, palpitations, dizziness or syncope. Pt denies new neurological symptoms such as new headache, or facial or extremity weakness or numbness   Pt denies polydipsia, polyuria.  Does have several wks ongoing nasal allergy symptoms with clear congestion, itch and sneeze, without fever, pain, ST. Past Medical History  Diagnosis Date  . Chronic rhinitis   . Edema   . Acute and chronic respiratory failure   . Morbid obesity     target wt=179lb for BMI<30 Peak wt 282lb  . DVT (deep venous thrombosis)   . COPD (chronic obstructive pulmonary disease)     Wert. PFTs 07/25/06 FEV1 55% ratio 53, DLC0 51% HFA 50% 12/16/2009  . Depression   . Osteoporosis     last BMD 4/08 -2.4, intolerant of bisphosphonates  . Hypertension   . Arrhythmia     atrial fibrillation, anticoagulation therapy  . Pulmonary embolism   . Anxiety   . Glucose intolerance (impaired glucose tolerance)     on steroids  . Hyperlipidemia   . VITAMIN D DEFICIENCY 10/27/2007  . HYPERLIPIDEMIA 06/19/2007  . PREMATURE VENTRICULAR CONTRACTIONS 10/23/2008  . Atrial flutter 07/01/2008  . Obstructive chronic bronchitis with exacerbation 01/19/2007  . PNEUMONIA ORGANISM NOS 01/09/2008  . UTI 08/06/2008  . WRIST PAIN, LEFT 11/27/2009  . ANKLE PAIN 03/28/2008  . Rhabdomyolysis 07/29/2009  . CRAMP IN LIMB 07/29/2009  . OSTEOPOROSIS 07/29/2006  . CHEST PAIN 09/16/2009  . Dysuria 07/22/2008  . FRACTURE, RIB, RIGHT 06/18/2009  . PULMONARY  EMBOLISM, HX OF 06/19/2007  . DEEP VENOUS THROMBOPHLEBITIS, HX OF 07/29/2006  . DYSPNEA 03/10/2010  . Chest pain   . Sinus infection    Past Surgical History  Procedure Date  . Abdominal hysterectomy   . Tonsillectomy   . Skin graft to middle r finger 1975  . S/p left arm fracture with fall off chair     reports that she quit smoking about 15 years ago. Her smoking use included Cigarettes. She has a 35 pack-year smoking history. She does not have any smokeless tobacco history on file. She reports that she does not drink alcohol or use illicit drugs. family history includes Asthma in her brother and son; Heart disease in her mother; and Throat cancer in her brother. Allergies  Allergen Reactions  . Duloxetine     REACTION: rhabdomyolysis  . Penicillins    Current Outpatient Prescriptions on File Prior to Visit  Medication Sig Dispense Refill  . acetaminophen (TYLENOL ARTHRITIS PAIN) 650 MG CR tablet Take as directed when needed for pain      . atorvastatin (LIPITOR) 10 MG tablet Take 1 tablet (10 mg total) by mouth at bedtime.  90 tablet  3  . Calcium Carbonate-Vitamin D 600-400 MG-UNIT per tablet Take 1 tablet by mouth 2 (two) times daily.        . Cetirizine HCl (ZYRTEC ALLERGY) 10 MG CAPS Take 1 capsule (10 mg total) by mouth at bedtime.      Marland Kitchen  cholecalciferol (VITAMIN D) 1000 UNITS tablet Take 1,000 Units by mouth daily.        . clotrimazole-betamethasone (LOTRISONE) cream Use as directed twice per day as needed for rash  15 g  1  . dextromethorphan-guaiFENesin (MUCINEX DM) 30-600 MG per 12 hr tablet 1 every 12 hours as needed w/flutter for cough/congestion      . EXFORGE 10-320 MG per tablet TAKE 1 TABLET DAILY  90 tablet  3  . FLUoxetine (PROZAC) 40 MG capsule Take 1 capsule (40 mg total) by mouth daily.  90 capsule  3  . furosemide (LASIX) 80 MG tablet TAKE 1 TABLET TWICE A DAY  180 tablet  2  . glucosamine-chondroitin 500-400 MG tablet Take 1 tablet by mouth daily.        Marland Kitchen  levalbuterol (XOPENEX) 1.25 MG/3ML nebulizer solution Take 1 ampule by nebulization every 4 (four) hours as needed.        . mometasone (NASONEX) 50 MCG/ACT nasal spray Place 1 spray into the nose 2 (two) times daily.      . nitroGLYCERIN (NITROSTAT) 0.4 MG SL tablet Place 1 tablet (0.4 mg total) under the tongue every 5 (five) minutes as needed for chest pain.  25 tablet  3  . omeprazole (PRILOSEC) 20 MG capsule Take 1 capsule (20 mg total) by mouth daily.  90 capsule  3  . oxymetazoline (AFRIN) 0.05 % nasal spray Place 2 sprays into the nose 2 (two) times daily as needed for congestion. For 5 days before nasonex      . potassium chloride SA (K-DUR,KLOR-CON) 20 MEQ tablet Take 1 tablet (20 mEq total) by mouth 2 (two) times daily.  180 tablet  3  . propafenone (RYTHMOL SR) 325 MG 12 hr capsule Take 1 capsule (325 mg total) by mouth 2 (two) times daily.  180 capsule  3  . sodium chloride (OCEAN) 0.65 % SOLN nasal spray 2 puffs every 4 hours as needed for nasal congestion      . SYMBICORT 160-4.5 MCG/ACT inhaler INHALE 2 PUFFS TWICE A DAY  3 Inhaler  3  . traMADol (ULTRAM) 50 MG tablet Take 1 tablet (50 mg total) by mouth every 4 (four) hours as needed. For cough  30 tablet  0  . warfarin (COUMADIN) 7.5 MG tablet Take 1 tablet (7.5 mg total) by mouth as directed. Take as directed by coumadin clinic  120 tablet  1  . XOPENEX HFA 45 MCG/ACT inhaler INHALE 2 PUFFS EVERY 4 HOURS AS NEEDED  3 Inhaler  0   No current facility-administered medications on file prior to visit.   Review of Systems Constitutional: Negative for diaphoresis and unexpected weight change.  HENT: Negative for tinnitus.   Eyes: Negative for visual disturbance.  Respiratory: Negative for choking and stridor.   Gastrointestinal: Negative for vomiting and blood in stool.  Genitourinary: Negative for hematuria and decreased urine volume.  Musculoskeletal: Negative for gait problem.  Skin: Negative for ulcer or wound Neurological:  Negative for tremors or numbness Psychiatric/Behavioral: Negative for decreased concentration. The patient is not hyperactive    Objective:   Physical Exam BP 130/80  Pulse 82  Temp 98.1 F (36.7 C) (Oral)  Ht 5\' 4"  (1.626 m)  Wt 283 lb (128.368 kg)  BMI 48.58 kg/m2  SpO2 93% Physical Exam  VS noted, mild ill, on home o2 Constitutional: Pt appears well-developed and well-nourished./obese  HENT: Head: Normocephalic.  Right Ear: External ear normal.  Left Ear: External ear normal.  Left upper and lower lids 1+ red/tender/swelling Bilat tm's mild erythema.  Sinus nontender.  Pharynx mild erythema Eyes: left Conjunctiva mild erythema, slight d/c. Pupils are equal, round, and reactive to light.  Neck: Normal range of motion. Neck supple.  Cardiovascular: Normal rate and regular rhythm.   Pulmonary/Chest: Effort normal and breath sounds normal.  Neurological: Pt is alert. Not confused Skin: Skin is warm. No erythema.  Psychiatric: Pt behavior is normal. Thought content normal.     Assessment & Plan:

## 2011-09-14 NOTE — Assessment & Plan Note (Signed)
stable overall by hx and exam, most recent data reviewed with pt, and pt to continue medical treatment as before SpO2 Readings from Last 3 Encounters:  09/14/11 93%  04/29/11 97%  04/14/11 96%

## 2011-09-15 ENCOUNTER — Telehealth: Payer: Self-pay | Admitting: Internal Medicine

## 2011-09-15 NOTE — Telephone Encounter (Signed)
Caller: [name]/Patient; Patient Name: [name] [name]; PCP: Oliver Barre (Adults only); Best Callback Phone Number: [phone].  Patient calling today 09/15/11 regarding saw Dr. Jonny Ruiz yesterday and he was supposed to call in some medication and her pharmacy does not have it.  Triager pulled patient chart in EPIC and see prescriptions written yesterday by Dr. Jonny Ruiz for Azithromycin 250 mg tablets, take as directed, dispense #6, refill x1, also Tobramycin 0.3 % opthalmic solution, place 1 drop both eyes 4 times daily x10 days, dispense 5 ml, no refills.  These were sent to [facility].  Patient said they were supposed to be [facility] to [facility] on [phone].  Called the above scripts to [facility] and spoke with Truda.  Advised [facility] to contact [facility] and tell them not to mail these prescriptions to her.

## 2011-09-22 ENCOUNTER — Telehealth: Payer: Self-pay

## 2011-09-22 NOTE — Telephone Encounter (Signed)
Patient was on azithromycin 250 mg took two tblets first day then one tablet everyday after that for five days. Her last pill of antibiotic (azithromycin) was Sept. 9, 2013. She is also taking eye drop tobramyzin. She will have a tooth pulled next week. Do you need to check PT/INR before she has the tooth pulled. Please call ASAP!

## 2011-09-22 NOTE — Telephone Encounter (Signed)
Returned call to pt, advised Azithromycin can increase INR slightly.  Offered pt appt today to check INR, but pt declined states she hasn't been driving, and isn't feeling 100% better.  Pt denies bleeding problems at present.  Recommended we check INR next week prior to dental extraction on 09/29/11 at 11:00am.  Made pt appt for 09/29/11 at 9:30am.

## 2011-09-29 ENCOUNTER — Ambulatory Visit (INDEPENDENT_AMBULATORY_CARE_PROVIDER_SITE_OTHER): Payer: BC Managed Care – PPO

## 2011-09-29 DIAGNOSIS — I4891 Unspecified atrial fibrillation: Secondary | ICD-10-CM | POA: Diagnosis not present

## 2011-09-29 DIAGNOSIS — Z86718 Personal history of other venous thrombosis and embolism: Secondary | ICD-10-CM | POA: Diagnosis not present

## 2011-09-29 DIAGNOSIS — I4892 Unspecified atrial flutter: Secondary | ICD-10-CM | POA: Diagnosis not present

## 2011-09-29 DIAGNOSIS — Z8672 Personal history of thrombophlebitis: Secondary | ICD-10-CM

## 2011-09-29 DIAGNOSIS — Z7901 Long term (current) use of anticoagulants: Secondary | ICD-10-CM

## 2011-09-29 LAB — POCT INR: INR: 2.4

## 2011-10-06 ENCOUNTER — Other Ambulatory Visit: Payer: Self-pay | Admitting: Internal Medicine

## 2011-10-06 ENCOUNTER — Telehealth: Payer: Self-pay | Admitting: Internal Medicine

## 2011-10-06 MED ORDER — BUDESONIDE-FORMOTEROL FUMARATE 160-4.5 MCG/ACT IN AERO: 2.0000 | INHALATION_SPRAY | Freq: Two times a day (BID) | RESPIRATORY_TRACT | Status: DC

## 2011-10-06 NOTE — Telephone Encounter (Signed)
Spoke with pt and notified rx for symbicort was sent to her pharm.  She verbalized understanding and states nothing further needed.

## 2011-10-13 ENCOUNTER — Ambulatory Visit (INDEPENDENT_AMBULATORY_CARE_PROVIDER_SITE_OTHER): Payer: BC Managed Care – PPO | Admitting: Internal Medicine

## 2011-10-13 ENCOUNTER — Encounter: Payer: Self-pay | Admitting: Internal Medicine

## 2011-10-13 VITALS — BP 128/68 | HR 69 | Temp 98.1°F | Ht 64.0 in | Wt 279.5 lb

## 2011-10-13 DIAGNOSIS — J309 Allergic rhinitis, unspecified: Secondary | ICD-10-CM

## 2011-10-13 DIAGNOSIS — Z23 Encounter for immunization: Secondary | ICD-10-CM | POA: Diagnosis not present

## 2011-10-13 DIAGNOSIS — Z0000 Encounter for general adult medical examination without abnormal findings: Secondary | ICD-10-CM

## 2011-10-13 DIAGNOSIS — R7309 Other abnormal glucose: Secondary | ICD-10-CM

## 2011-10-13 DIAGNOSIS — I4891 Unspecified atrial fibrillation: Secondary | ICD-10-CM

## 2011-10-13 DIAGNOSIS — R7302 Impaired glucose tolerance (oral): Secondary | ICD-10-CM

## 2011-10-13 DIAGNOSIS — R259 Unspecified abnormal involuntary movements: Secondary | ICD-10-CM

## 2011-10-13 DIAGNOSIS — R251 Tremor, unspecified: Secondary | ICD-10-CM | POA: Insufficient documentation

## 2011-10-13 MED ORDER — PREDNISONE 10 MG PO TABS: ORAL_TABLET | ORAL | Status: DC

## 2011-10-13 MED ORDER — AMLODIPINE BESYLATE-VALSARTAN 10-320 MG PO TABS: 1.0000 | ORAL_TABLET | Freq: Every day | ORAL | Status: DC

## 2011-10-13 NOTE — Assessment & Plan Note (Addendum)
With unusual RUE/LUE and RLE tremor, some gait worsening - ? parkinsons - for [place] [facility] referral

## 2011-10-13 NOTE — Patient Instructions (Addendum)
You will be contacted regarding the referral for: [facility] cou[facility]in clinic, Cardiology in [place] (if they are able to accept you as a patient), and [facility] (for ? Of parkinsons) Take all new medications as prescribed - the prednisone ([facility]) Continue all other medications as before, including the Exforge (sent to [facility]) Please keep your appointments with your specialists as you have planned, such as the next coumadin check - we can have you meet Ms Leavy Cella as you leave today Please remember to sign up for My Chart at your earliest convenience, as this will be important to you in the future with finding out test results. Please return in 6 mo with Lab testing done 3-5 days before

## 2011-10-13 NOTE — Assessment & Plan Note (Signed)
With acute flare - for predpack asd today,  to f/u any worsening symptoms or concerns

## 2011-10-13 NOTE — Assessment & Plan Note (Signed)
Pt asks for referral to card in [place] as well - will try to help arrange

## 2011-10-16 ENCOUNTER — Encounter: Payer: Self-pay | Admitting: Internal Medicine

## 2011-10-16 NOTE — Assessment & Plan Note (Signed)
stable overall by hx and exam, most recent data reviewed with pt, and pt to continue medical treatment as before Lab Results  Component Value Date   HGBA1C 4.7 03/12/2011

## 2011-10-16 NOTE — Progress Notes (Signed)
Subjective:    Patient ID: [name] [name], female    DOB: December 31, 1950, 61 y.o.   MRN: [id number]  HPI  Here to f/u; overall doing ok,  Pt denies chest pain, increased sob or doe, wheezing, orthopnea, PND, increased LE swelling, palpitations, dizziness or syncope.  Pt denies new neurological symptoms such as new headache, or facial or extremity weakness or numbness   Pt denies polydipsia, polyuria.  Pt states overall good compliance with meds, trying to follow lower cholesterol diet.  Does have several wks ongoing nasal allergy symptoms with clear congestion, itch and sneeze, without fever, pain, ST, cough or wheezing.  Has ongoing right knee arthritic pain, not worse but is persistent, with occasional swelling, not worse recently.  Needs refill exforge.  For flu shot today.  Has ongoing tremor to RUE, now to RLE as well. Past Medical History  Diagnosis Date  . Chronic rhinitis   . Edema   . Acute and chronic respiratory failure   . Morbid obesity     target wt=179lb for BMI<30 Peak wt 282lb  . DVT (deep venous thrombosis)   . COPD (chronic obstructive pulmonary disease)     Wert. PFTs 07/25/06 FEV1 55% ratio 53, DLC0 51% HFA 50% 12/16/2009  . Depression   . Osteoporosis     last BMD 4/08 -2.4, intolerant of bisphosphonates  . Hypertension   . Arrhythmia     atrial fibrillation, anticoagulation therapy  . Pulmonary embolism   . Anxiety   . Glucose intolerance (impaired glucose tolerance)     on steroids  . Hyperlipidemia   . VITAMIN D DEFICIENCY 10/27/2007  . HYPERLIPIDEMIA 06/19/2007  . PREMATURE VENTRICULAR CONTRACTIONS 10/23/2008  . Atrial flutter 07/01/2008  . Obstructive chronic bronchitis with exacerbation 01/19/2007  . PNEUMONIA ORGANISM NOS 01/09/2008  . UTI 08/06/2008  . WRIST PAIN, LEFT 11/27/2009  . ANKLE PAIN 03/28/2008  . Rhabdomyolysis 07/29/2009  . CRAMP IN LIMB 07/29/2009  . OSTEOPOROSIS 07/29/2006  . CHEST PAIN 09/16/2009  . Dysuria 07/22/2008  . FRACTURE, RIB, RIGHT 06/18/2009    . PULMONARY EMBOLISM, HX OF 06/19/2007  . DEEP VENOUS THROMBOPHLEBITIS, HX OF 07/29/2006  . DYSPNEA 03/10/2010  . Chest pain   . Sinus infection   . Tremor 10/13/2011   Past Surgical History  Procedure Date  . Abdominal hysterectomy   . Tonsillectomy   . Skin graft to middle r finger 1975  . S/p left arm fracture with fall off chair     reports that she quit smoking about 15 years ago. Her smoking use included Cigarettes. She has a 35 pack-year smoking history. She does not have any smokeless tobacco history on file. She reports that she does not drink alcohol or use illicit drugs. family history includes Asthma in her brother and son; Heart disease in her mother; and Throat cancer in her brother. Allergies  Allergen Reactions  . Duloxetine     REACTION: rhabdomyolysis  . Penicillins    Current Outpatient Prescriptions on File Prior to Visit  Medication Sig Dispense Refill  . acetaminophen (TYLENOL ARTHRITIS PAIN) 650 MG CR tablet Take as directed when needed for pain      . amLODipine-valsartan (EXFORGE) 10-320 MG per tablet Take 1 tablet by mouth daily.  90 tablet  3  . atorvastatin (LIPITOR) 10 MG tablet Take 1 tablet (10 mg total) by mouth at bedtime.  90 tablet  3  . budesonide-formoterol (SYMBICORT) 160-4.5 MCG/ACT inhaler Inhale 2 puffs into the lungs 2 (two) times  daily.  3 Inhaler  3  . Calcium Carbonate-Vitamin D 600-400 MG-UNIT per tablet Take 1 tablet by mouth 2 (two) times daily.        . Cetirizine HCl (ZYRTEC ALLERGY) 10 MG CAPS Take 1 capsule (10 mg total) by mouth at bedtime.      . cholecalciferol (VITAMIN D) 1000 UNITS tablet Take 1,000 Units by mouth daily.        Marland Kitchen dextromethorphan-guaiFENesin (MUCINEX DM) 30-600 MG per 12 hr tablet 1 every 12 hours as needed w/flutter for cough/congestion      . FLUoxetine (PROZAC) 40 MG capsule Take 1 capsule (40 mg total) by mouth daily.  90 capsule  3  . furosemide (LASIX) 80 MG tablet TAKE 1 TABLET TWICE A DAY  180 tablet  2   . glucosamine-chondroitin 500-400 MG tablet Take 1 tablet by mouth daily.        Marland Kitchen levalbuterol (XOPENEX) 1.25 MG/3ML nebulizer solution Take 1 ampule by nebulization every 4 (four) hours as needed.        . mometasone (NASONEX) 50 MCG/ACT nasal spray Place 1 spray into the nose 2 (two) times daily.      . nitroGLYCERIN (NITROSTAT) 0.4 MG SL tablet Place 1 tablet (0.4 mg total) under the tongue every 5 (five) minutes as needed for chest pain.  25 tablet  3  . omeprazole (PRILOSEC) 20 MG capsule Take 1 capsule (20 mg total) by mouth daily.  90 capsule  3  . oxymetazoline (AFRIN) 0.05 % nasal spray Place 2 sprays into the nose 2 (two) times daily as needed for congestion. For 5 days before nasonex      . potassium chloride SA (K-DUR,KLOR-CON) 20 MEQ tablet Take 1 tablet (20 mEq total) by mouth 2 (two) times daily.  180 tablet  3  . propafenone (RYTHMOL SR) 325 MG 12 hr capsule Take 1 capsule (325 mg total) by mouth 2 (two) times daily.  180 capsule  3  . sodium chloride (OCEAN) 0.65 % SOLN nasal spray 2 puffs every 4 hours as needed for nasal congestion      . SYMBICORT 160-4.5 MCG/ACT inhaler INHALE 2 PUFFS TWICE A DAY  1 Inhaler  11  . traMADol (ULTRAM) 50 MG tablet Take 1 tablet (50 mg total) by mouth every 4 (four) hours as needed. For cough  30 tablet  0  . warfarin (COUMADIN) 7.5 MG tablet Take 1 tablet (7.5 mg total) by mouth as directed. Take as directed by coumadin clinic  120 tablet  1  . XOPENEX HFA 45 MCG/ACT inhaler INHALE 2 PUFFS EVERY 4 HOURS AS NEEDED  3 Inhaler  0   Review of Systems  Constitutional: Negative for diaphoresis and unexpected weight change.  HENT: Negative for tinnitus.   Eyes: Negative for photophobia and visual disturbance.  Respiratory: Negative for choking and stridor.   Gastrointestinal: Negative for vomiting and blood in stool.  Genitourinary: Negative for hematuria and decreased urine volume.  Musculoskeletal: Negative for other acute joint swelling Skin:  Negative for color change and wound.  Neurological: Negative for numbness.  Psychiatric/Behavioral: Negative for decreased concentration. The patient is not hyperactive.      Objective:   Physical Exam BP 128/68  Pulse 69  Temp 98.1 F (36.7 C) (Oral)  Ht 5\' 4"  (1.626 m)  Wt 279 lb 8 oz (126.78 kg)  BMI 47.98 kg/m2  SpO2 97% Physical Exam  VS noted, chronic ill appearing, on Home o2 Constitutional: Pt appears well-developed and  well-nourished.  HENT: Head: Normocephalic.  Right Ear: External ear normal.  Left Ear: External ear normal.  Bilat tm's mild erythema.  Sinus nontender.  Pharynx mild erythema Eyes: Conjunctivae and EOM are normal. Pupils are equal, round, and reactive to light.  Neck: Normal range of motion. Neck supple.  Cardiovascular: Normal rate and irregular rhythm.   Pulmonary/Chest: Effort normal and breath sounds decreased, no rales or wheeze.  Abd:  Soft, NT, non-distended, + BS Right knee with trace effusion, creipitus, NT Neurological: Pt is alert. Not confused  Skin: Skin is warm. No erythema. trace edema to LE's bilat Psychiatric: Pt behavior is normal. Thought content normal. 1+ nervous    Assessment & Plan:

## 2011-11-05 ENCOUNTER — Telehealth: Payer: Self-pay | Admitting: Internal Medicine

## 2011-11-05 NOTE — Telephone Encounter (Signed)
Called patient, who says she received letter from medicare stating medicare claims have been denied paying because they need new or revised cmn re; her o2... I called [facility] in ref to this, [facility] needs new qualifying sats on this patient. Walked without her 81. Per Medicare guidelines 1. Resting on ra 2. Walking on ra 2. Walking on 02  .Kandice Hams Patient has scheduled appt 11/17/11

## 2011-11-08 ENCOUNTER — Ambulatory Visit (INDEPENDENT_AMBULATORY_CARE_PROVIDER_SITE_OTHER): Payer: BC Managed Care – PPO | Admitting: Cardiology

## 2011-11-08 ENCOUNTER — Encounter: Payer: Self-pay | Admitting: Cardiology

## 2011-11-08 ENCOUNTER — Other Ambulatory Visit: Payer: Self-pay | Admitting: *Deleted

## 2011-11-08 VITALS — BP 135/81 | HR 94 | Wt 284.0 lb

## 2011-11-08 DIAGNOSIS — I4891 Unspecified atrial fibrillation: Secondary | ICD-10-CM

## 2011-11-08 DIAGNOSIS — I10 Essential (primary) hypertension: Secondary | ICD-10-CM

## 2011-11-08 DIAGNOSIS — E785 Hyperlipidemia, unspecified: Secondary | ICD-10-CM | POA: Diagnosis not present

## 2011-11-08 DIAGNOSIS — R0989 Other specified symptoms and signs involving the circulatory and respiratory systems: Secondary | ICD-10-CM

## 2011-11-08 DIAGNOSIS — R0609 Other forms of dyspnea: Secondary | ICD-10-CM

## 2011-11-08 DIAGNOSIS — E876 Hypokalemia: Secondary | ICD-10-CM

## 2011-11-08 LAB — BASIC METABOLIC PANEL
BUN: 14 mg/dL (ref 6–23)
CO2: 34 mEq/L — ABNORMAL HIGH (ref 19–32)
Calcium: 8.7 mg/dL (ref 8.4–10.5)
Chloride: 101 mEq/L (ref 96–112)
Creatinine, Ser: 1 mg/dL (ref 0.4–1.2)
GFR: 57.18 mL/min — ABNORMAL LOW (ref >60.00)
Glucose, Bld: 110 mg/dL — ABNORMAL HIGH (ref 70–99)
Potassium: 3.3 mEq/L — ABNORMAL LOW (ref 3.5–5.1)
Sodium: 141 mEq/L (ref 135–145)

## 2011-11-08 LAB — CBC WITH DIFFERENTIAL/PLATELET
Basophils Absolute: 0 10*3/uL (ref 0.0–0.1)
Basophils Relative: 0.3 % (ref 0.0–3.0)
Eosinophils Absolute: 0.1 10*3/uL (ref 0.0–0.7)
Eosinophils Relative: 1.5 % (ref 0.0–5.0)
HCT: 33.6 % — ABNORMAL LOW (ref 36.0–46.0)
Hemoglobin: 11.3 g/dL — ABNORMAL LOW (ref 12.0–15.0)
Lymphocytes Relative: 27.8 % (ref 12.0–46.0)
Lymphs Abs: 2.2 10*3/uL (ref 0.7–4.0)
MCHC: 33.6 g/dL (ref 30.0–36.0)
MCV: 85.9 fl (ref 78.0–100.0)
Monocytes Absolute: 0.6 10*3/uL (ref 0.1–1.0)
Monocytes Relative: 8.1 % (ref 3.0–12.0)
Neutro Abs: 5 10*3/uL (ref 1.4–7.7)
Neutrophils Relative %: 62.3 % (ref 43.0–77.0)
Platelets: 228 10*3/uL (ref 150.0–400.0)
RBC: 3.91 Mil/uL (ref 3.87–5.11)
RDW: 18.7 % — ABNORMAL HIGH (ref 11.5–14.6)
WBC: 8 10*3/uL (ref 4.5–10.5)

## 2011-11-08 MED ORDER — POTASSIUM CHLORIDE CRYS ER 20 MEQ PO TBCR: EXTENDED_RELEASE_TABLET | ORAL | Status: DC

## 2011-11-08 NOTE — Progress Notes (Signed)
HPI: [name] [name] previously followed by Dr Mariah Milling for fu of atrial fibrillation, history of PE on chronic Coumadin, diastolic dysfunction, morbid obesity and COPD. Echocardiogram March 10 2010 shows normal systolic function, diastolic dysfunction, mild MR, elevated right ventricular systolic pressure consistent with mild pulmonary hypertension. Cardiac catheterization in 2003 showed no significant coronary artery disease. Since she was last seen she has dyspnea on exertion which is chronic. There is no orthopnea or PND. She has chronic pedal edema which is slightly worse. There is no chest pain or syncope. No bleeding.    Current Outpatient Prescriptions  Medication Sig Dispense Refill  . acetaminophen (TYLENOL ARTHRITIS PAIN) 650 MG CR tablet Take as directed when needed for pain      . amLODipine-valsartan (EXFORGE) 10-320 MG per tablet Take 1 tablet by mouth daily.  90 tablet  3  . budesonide-formoterol (SYMBICORT) 160-4.5 MCG/ACT inhaler Inhale 2 puffs into the lungs 2 (two) times daily.  3 Inhaler  3  . Calcium Carbonate-Vitamin D 600-400 MG-UNIT per tablet Take 1 tablet by mouth 2 (two) times daily.        . Cetirizine HCl (ZYRTEC ALLERGY) 10 MG CAPS Take 1 capsule (10 mg total) by mouth at bedtime.      . cholecalciferol (VITAMIN D) 1000 UNITS tablet Take 1,000 Units by mouth daily.        Marland Kitchen dextromethorphan-guaiFENesin (MUCINEX DM) 30-600 MG per 12 hr tablet 1 every 12 hours as needed w/flutter for cough/congestion      . FLUoxetine (PROZAC) 40 MG capsule Take 1 capsule (40 mg total) by mouth daily.  90 capsule  3  . furosemide (LASIX) 80 MG tablet TAKE 1 TABLET TWICE A DAY  180 tablet  2  . glucosamine-chondroitin 500-400 MG tablet Take 1 tablet by mouth daily.        Marland Kitchen levalbuterol (XOPENEX) 1.25 MG/3ML nebulizer solution Take 1 ampule by nebulization every 4 (four) hours as needed.        . mometasone (NASONEX) 50 MCG/ACT nasal spray Place 1 spray into the nose 2 (two) times  daily.      . nitroGLYCERIN (NITROSTAT) 0.4 MG SL tablet Place 1 tablet (0.4 mg total) under the tongue every 5 (five) minutes as needed for chest pain.  25 tablet  3  . omeprazole (PRILOSEC) 20 MG capsule Take 1 capsule (20 mg total) by mouth daily.  90 capsule  3  . oxymetazoline (AFRIN) 0.05 % nasal spray Place 2 sprays into the nose 2 (two) times daily as needed for congestion. For 5 days before nasonex      . potassium chloride SA (K-DUR,KLOR-CON) 20 MEQ tablet Take 1 tablet (20 mEq total) by mouth 2 (two) times daily.  180 tablet  3  . propafenone (RYTHMOL SR) 325 MG 12 hr capsule Take 1 capsule (325 mg total) by mouth 2 (two) times daily.  180 capsule  3  . sodium chloride (OCEAN) 0.65 % SOLN nasal spray 2 puffs every 4 hours as needed for nasal congestion      . SYMBICORT 160-4.5 MCG/ACT inhaler INHALE 2 PUFFS TWICE A DAY  1 Inhaler  11  . traMADol (ULTRAM) 50 MG tablet Take 1 tablet (50 mg total) by mouth every 4 (four) hours as needed. For cough  30 tablet  0  . warfarin (COUMADIN) 7.5 MG tablet Take 1 tablet (7.5 mg total) by mouth as directed. Take as directed by coumadin clinic  120 tablet  1  . Pauline Aus  HFA 45 MCG/ACT inhaler INHALE 2 PUFFS EVERY 4 HOURS AS NEEDED  3 Inhaler  0  . atorvastatin (LIPITOR) 10 MG tablet Take 1 tablet (10 mg total) by mouth at bedtime.  90 tablet  3     Past Medical History  Diagnosis Date  . Chronic rhinitis   . Acute and chronic respiratory failure   . Morbid obesity     target wt=179lb for BMI<30 Peak wt 282lb  . DVT (deep venous thrombosis)   . COPD (chronic obstructive pulmonary disease)     [facility]ert. PFTs 07/25/06 FEV1 55% ratio 53, DLC0 51% HFA 50% 12/16/2009  . Depression   . Osteoporosis     last BMD 4/08 -2.4, intolerant of bisphosphonates  . Hypertension   . Arrhythmia     atrial fibrillation, anticoagulation therapy  . Anxiety   . Glucose intolerance (impaired glucose tolerance)     on steroids  . VITAMIN D DEFICIENCY 10/27/2007  .  HYPERLIPIDEMIA 06/19/2007  . PREMATURE VENTRICULAR CONTRACTIONS 10/23/2008  . Atrial flutter 07/01/2008  . Rhabdomyolysis 07/29/2009  . OSTEOPOROSIS 07/29/2006  . FRACTURE, RIB, RIGHT 06/18/2009  . PULMONARY EMBOLISM, HX OF 06/19/2007  . Tremor 10/13/2011    Past Surgical History  Procedure Date  . Abdominal hysterectomy   . Tonsillectomy   . Skin graft to middle r finger 1975  . S/p left arm fracture with fall off chair     History   Social History  . Marital Status: Married    Spouse Name: N/A    Number of Children: 3  . Years of Education: N/A   Occupational History  . 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 Smoking status: Former Smoker -- 1.0 packs/day for 35 years    Types: Cigarettes    Quit date: 01/12/1996  . Smokeless tobacco: Not on file  . Alcohol Use: No  . Drug Use: No  . Sexually Active: Not on file   Other Topics Concern  . Not on file   Social History Narrative  . No narrative on file    ROS: no fevers or chills, productive cough, hemoptysis, dysphasia, odynophagia, melena, hematochezia, dysuria, hematuria, rash, seizure activity, orthopnea, PND, pedal edema, claudication. Remaining systems are negative.  Physical Exam: Well-developed morbidly obese in no acute distress.  Skin is warm and dry.  HEENT is normal.  Neck is supple.  Chest is clear to auscultation with normal expansion.  Cardiovascular exam is regular rate and rhythm. 2/6 systolic ejection murmur Abdominal exam nontender or distended. No masses palpated. Extremities show trace edema. neuro grossly intact  ECG 09/08/2011-sinus rhythm with nonspecific ST changes. RV conduction delay.

## 2011-11-08 NOTE — Assessment & Plan Note (Signed)
We discussed weight loss. 

## 2011-11-08 NOTE — Assessment & Plan Note (Signed)
Continue statin. 

## 2011-11-08 NOTE — Patient Instructions (Addendum)
Your physician wants you to follow-up in: 6 MONTHS WITH DR CRENSHAW You will receive a reminder letter in the mail two months in advance. If you don't receive a letter, please call our office to schedule the follow-up appointment.   Your physician has requested that you have an echocardiogram. Echocardiography is a painless test that uses sound waves to create images of your heart. It provides your doctor with information about the size and shape of your heart and how well your heart's chambers and valves are working. This procedure takes approximately one hour. There are no restrictions for this procedure.   Your physician recommends that you HAVE LAB WORK TODAY 

## 2011-11-08 NOTE — Assessment & Plan Note (Signed)
Most likely multifactorial including obesity, deconditioning, COPD and diastolic congestive heart failure. Continue present dose of Lasix. Check potassium and renal function. She will take an additional 40 mg daily as needed.

## 2011-11-08 NOTE — Assessment & Plan Note (Signed)
Pressure controlled. Continue present medications. 

## 2011-11-08 NOTE — Assessment & Plan Note (Addendum)
Patient remains in sinus rhythm on examination. Continue present medications including Coumadin. She will need anticoagulation lifelong given history of paroxysmal atrial fibrillation, embolic risk factors, prior pulmonary embolus and DVT. Check hemoglobin.

## 2011-11-10 ENCOUNTER — Ambulatory Visit (INDEPENDENT_AMBULATORY_CARE_PROVIDER_SITE_OTHER): Payer: BC Managed Care – PPO | Admitting: General Practice

## 2011-11-10 DIAGNOSIS — I4891 Unspecified atrial fibrillation: Secondary | ICD-10-CM

## 2011-11-10 DIAGNOSIS — Z8672 Personal history of thrombophlebitis: Secondary | ICD-10-CM

## 2011-11-10 DIAGNOSIS — Z7901 Long term (current) use of anticoagulants: Secondary | ICD-10-CM

## 2011-11-10 DIAGNOSIS — Z86718 Personal history of other venous thrombosis and embolism: Secondary | ICD-10-CM

## 2011-11-10 DIAGNOSIS — I4892 Unspecified atrial flutter: Secondary | ICD-10-CM | POA: Diagnosis not present

## 2011-11-10 LAB — POCT INR: INR: 3.1

## 2011-11-10 NOTE — Telephone Encounter (Signed)
Will forward msg to [place] and MW so they are aware this is needed.

## 2011-11-11 ENCOUNTER — Encounter: Payer: Self-pay | Admitting: Neurology

## 2011-11-11 ENCOUNTER — Ambulatory Visit (INDEPENDENT_AMBULATORY_CARE_PROVIDER_SITE_OTHER): Payer: BC Managed Care – PPO | Admitting: Neurology

## 2011-11-11 VITALS — BP 124/80 | HR 84 | Temp 98.0°F | Resp 18 | Wt 284.0 lb

## 2011-11-11 DIAGNOSIS — H532 Diplopia: Secondary | ICD-10-CM

## 2011-11-11 DIAGNOSIS — G250 Essential tremor: Secondary | ICD-10-CM

## 2011-11-11 DIAGNOSIS — R0602 Shortness of breath: Secondary | ICD-10-CM

## 2011-11-11 DIAGNOSIS — G251 Drug-induced tremor: Secondary | ICD-10-CM

## 2011-11-11 NOTE — Patient Instructions (Addendum)
1.  Call me for results of labs in a week 2.  Let me know if you change your mind and want to try medication for tremor

## 2011-11-11 NOTE — Progress Notes (Signed)
Subjective:   [name] [name] was seen in consultation in the movement disorder clinic at the request of Oliver Barre, MD.  The evaluation is for tremor.  The patient is a 61 y.o. right handed female with a history of tremor.Onset of symptoms was gradual, starting about 1 year ago. Tremor started in both hands equally but now it seems to involve in her legs as well.  She has no head tremor.  Fatigue makes it worse.  Caffeine does not affect the tremor.  She is on symbicort and uses prednisone intermittently, the last a month ago.  She does not recall the prednisone making her worse.  There is no family hx of tremor.  She does not feel like she has a tremor on the inside.  She does have postural instability swith some falls.  She had an MRI of the brain in April 2013 demonstrating small vessel disease.  Tremor interferes with typing.  She has to hold the left hand with the right when she types.  She wears a bib when eating spaghetti.  She has some trouble with soup but has no trouble with peas on a spoon.    She can drink liquid with one hand.  The patient also reports that she has had diplopia for the last few years.  She has not had any ptosis.  She has been to an eye doctor regarding the diplopia.  While she did not know the exact etiology, she does state that she was told that she had a "lazy eye."  She got new glasses with prisms, but that did not particularly help.  She does have chronic shortness of breath, but that is from COPD.  Current/Previously tried tremor medications: none  Current medications that may exacerbate tremor:  symbicort   Allergies  Allergen Reactions  . Duloxetine     REACTION: rhabdomyolysis  . Penicillins     Current Outpatient Prescriptions on File Prior to Visit  Medication Sig Dispense Refill  . acetaminophen (TYLENOL ARTHRITIS PAIN) 650 MG CR tablet Take as directed when needed for pain      . amLODipine-valsartan (EXFORGE) 10-320 MG per tablet Take 1 tablet by  mouth daily.  90 tablet  3  . atorvastatin (LIPITOR) 10 MG tablet Take 1 tablet (10 mg total) by mouth at bedtime.  90 tablet  3  . budesonide-formoterol (SYMBICORT) 160-4.5 MCG/ACT inhaler Inhale 2 puffs into the lungs 2 (two) times daily.  3 Inhaler  3  . Calcium Carbonate-Vitamin D 600-400 MG-UNIT per tablet Take 1 tablet by mouth 2 (two) times daily.        . Cetirizine HCl (ZYRTEC ALLERGY) 10 MG CAPS Take 1 capsule (10 mg total) by mouth at bedtime.      . cholecalciferol (VITAMIN D) 1000 UNITS tablet Take 1,000 Units by mouth daily.        Marland Kitchen dextromethorphan-guaiFENesin (MUCINEX DM) 30-600 MG per 12 hr tablet 1 every 12 hours as needed w/flutter for cough/congestion      . FLUoxetine (PROZAC) 40 MG capsule Take 1 capsule (40 mg total) by mouth daily.  90 capsule  3  . furosemide (LASIX) 80 MG tablet TAKE 1 TABLET TWICE A DAY  180 tablet  2  . glucosamine-chondroitin 500-400 MG tablet Take 1 tablet by mouth daily.        Marland Kitchen levalbuterol (XOPENEX) 1.25 MG/3ML nebulizer solution Take 1 ampule by nebulization every 4 (four) hours as needed.        Marland Kitchen  mometasone (NASONEX) 50 MCG/ACT nasal spray Place 1 spray into the nose 2 (two) times daily.      . nitroGLYCERIN (NITROSTAT) 0.4 MG SL tablet Place 1 tablet (0.4 mg total) under the tongue every 5 (five) minutes as needed for chest pain.  25 tablet  3  . omeprazole (PRILOSEC) 20 MG capsule Take 1 capsule (20 mg total) by mouth daily.  90 capsule  3  . oxymetazoline (AFRIN) 0.05 % nasal spray Place 2 sprays into the nose 2 (two) times daily as needed for congestion. For 5 days before nasonex      . potassium chloride SA (K-DUR,KLOR-CON) 20 MEQ tablet Take two tablets twice daily  360 tablet  3  . propafenone (RYTHMOL SR) 325 MG 12 hr capsule Take 1 capsule (325 mg total) by mouth 2 (two) times daily.  180 capsule  3  . sodium chloride (OCEAN) 0.65 % SOLN nasal spray 2 puffs every 4 hours as needed for nasal congestion      . SYMBICORT 160-4.5 MCG/ACT  inhaler INHALE 2 PUFFS TWICE A DAY  1 Inhaler  11  . traMADol (ULTRAM) 50 MG tablet Take 1 tablet (50 mg total) by mouth every 4 (four) hours as needed. For cough  30 tablet  0  . warfarin (COUMADIN) 7.5 MG tablet Take 1 tablet (7.5 mg total) by mouth as directed. Take as directed by coumadin clinic  120 tablet  1  . XOPENEX HFA 45 MCG/ACT inhaler INHALE 2 PUFFS EVERY 4 HOURS AS NEEDED  3 Inhaler  0    Past Medical History  Diagnosis Date  . Chronic rhinitis   . Acute and chronic respiratory failure   . Morbid obesity     target wt=179lb for BMI<30 Peak wt 282lb  . DVT (deep venous thrombosis)   . COPD (chronic obstructive pulmonary disease)     Wert. PFTs 07/25/06 FEV1 55% ratio 53, DLC0 51% HFA 50% 12/16/2009  . Depression   . Osteoporosis     last BMD 4/08 -2.4, intolerant of bisphosphonates  . Hypertension   . Arrhythmia     atrial fibrillation, anticoagulation therapy  . Anxiety   . Glucose intolerance (impaired glucose tolerance)     on steroids  . VITAMIN D DEFICIENCY 10/27/2007  . HYPERLIPIDEMIA 06/19/2007  . PREMATURE VENTRICULAR CONTRACTIONS 10/23/2008  . Atrial flutter 07/01/2008  . Rhabdomyolysis 07/29/2009  . OSTEOPOROSIS 07/29/2006  . FRACTURE, RIB, RIGHT 06/18/2009  . PULMONARY EMBOLISM, HX OF 06/19/2007  . Tremor 10/13/2011    Past Surgical History  Procedure Date  . Abdominal hysterectomy   . Tonsillectomy   . Skin graft to middle r finger 1975  . S/p left arm fracture with fall off chair     History   Social History  . Marital Status: Married    Spouse Name: N/A    Number of Children: 3  . Years of Education: N/A   Occupational History  . 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 Smoking status: Former Smoker -- 1.0 packs/day for 35 years    Types: Cigarettes    Quit date: 01/12/1996  . Smokeless tobacco: Never Used  . Alcohol Use: No  . Drug Use: No  . Sexually Active: Not on file   Other Topics Concern  . Not on  file   Social History Narrative  . No narrative on file    Family Status  Relation Status Death Age  . Mother Deceased  MI  . Father Deceased     liver failure  . Brother Deceased     2, CA (throat), AIDS  . Brother Alive     3, HLD, HTN  . Child Alive     3, macular degeneration    Review of Systems She has had some nausea and black stools just today.  She is on iron supplementation.  A complete 10 system ROS was obtained and was negative apart from what is mentioned.   Objective:   VITALS:   Filed Vitals:   11/11/11 1005  BP: 124/80  Pulse: 84  Temp: 98 F (36.7 C)  Resp: 18  Weight: 284 lb (128.822 kg)   Gen:  Appears stated age and in NAD. HEENT:  Normocephalic, atraumatic. The mucous membranes are moist. The superficial temporal arteries are without ropiness or tenderness. Cardiovascular: Regular rate and rhythm. Lungs: Clear to auscultation bilaterally. Neck: There are no carotid bruits noted bilaterally.  NEUROLOGICAL:  Orientation:  The patient is alert and oriented x 3.  Recent and remote memory are intact.  Attention span and concentration are normal.  Able to name objects and repeat without trouble.  Fund of knowledge is appropriate Cranial nerves: There is good facial symmetry. The pupils are equal round and reactive to light bilaterally. Fundoscopic exam reveals clear disc margins bilaterally. Extraocular muscles are intact and visual fields are full to confrontational testing. Speech is fluent and clear. Soft palate rises symmetrically and there is no tongue deviation. Hearing is intact to conversational tone. Tone: Tone is good throughout. Sensation: Sensation is intact to light touch and pinprick throughout (facial, trunk, extremities). Vibration is intact at the bilateral big toe. There is no extinction with double simultaneous stimulation. There is no sensory dermatomal level identified. Coordination:  The patient has no dysdiadichokinesia or  dysmetria. Motor: Strength is 5/5 in the bilateral upper and lower extremities.  Shoulder shrug is equal bilaterally.  There is no pronator drift.  There are no fasciculations noted. DTR's: Deep tendon reflexes are 2/4 at the bilateral biceps, triceps, brachioradialis, patella and achilles.  Plantar responses are downgoing bilaterally. Gait and Station: The patient is able to ambulate without difficulty.  The patient is able to stand in the Romberg position.  She is able to arise out of the chair without use of her hands.  MOVEMENT EXAM: Tremor:  There is  tremor in the UE, noted most significantly with intention.  The left is worse than the right..  The patient is  able to draw Archimedes spirals with minimal difficulty.  There is no tremor at rest.       Assessment:   1.  Tremor, likely due to symbicort.  While she may have a central tremor, this is unable to be diagnosed given the fact that she is on a tremor causing medication.  I suspect that her low amplitude high frequency tremor is from the Symbicort.  2.  Diplopia. Plan:   1.  We discussed the diagnosis as well as pathophysiology of the disease.  We discussed treatment options as well as prognostic indicators.  Patient education was provided.  Greater than 50% of the 45 minute visit was spent in counseling in this regard. 2.  talked about the use of other medications to help control tremor, such as primidone, but ultimately we decided to hold off on any further medication since she is on so many meds.  If she changes her mind, this is certainly an option in the future.  3.  while her diplopia is likely from slight malalignment, I think that we should go ahead and at least check a myasthenia panel since she does have diplopia and shortness of breath (other shortness of breath is likely from COPD).  She is call me in a week for those results. 4.  I will plan on seeing her back in about 6 months to reevaluate her tremor, but told her to call  me if she needed to see me before then.  I did warn her that her tremor will likely increase if she is placed back on prednisone.

## 2011-11-15 ENCOUNTER — Other Ambulatory Visit: Payer: BC Managed Care – PPO

## 2011-11-16 ENCOUNTER — Other Ambulatory Visit (INDEPENDENT_AMBULATORY_CARE_PROVIDER_SITE_OTHER): Payer: BC Managed Care – PPO

## 2011-11-16 ENCOUNTER — Ambulatory Visit (HOSPITAL_COMMUNITY): Payer: BC Managed Care – PPO | Attending: Cardiology

## 2011-11-16 DIAGNOSIS — J449 Chronic obstructive pulmonary disease, unspecified: Secondary | ICD-10-CM | POA: Insufficient documentation

## 2011-11-16 DIAGNOSIS — I4891 Unspecified atrial fibrillation: Secondary | ICD-10-CM

## 2011-11-16 DIAGNOSIS — E876 Hypokalemia: Secondary | ICD-10-CM

## 2011-11-16 DIAGNOSIS — I369 Nonrheumatic tricuspid valve disorder, unspecified: Secondary | ICD-10-CM | POA: Diagnosis not present

## 2011-11-16 DIAGNOSIS — I10 Essential (primary) hypertension: Secondary | ICD-10-CM | POA: Diagnosis not present

## 2011-11-16 DIAGNOSIS — J4489 Other specified chronic obstructive pulmonary disease: Secondary | ICD-10-CM | POA: Insufficient documentation

## 2011-11-16 LAB — BASIC METABOLIC PANEL
BUN: 12 mg/dL (ref 6–23)
CO2: 33 mEq/L — ABNORMAL HIGH (ref 19–32)
Calcium: 8.3 mg/dL — ABNORMAL LOW (ref 8.4–10.5)
Chloride: 100 mEq/L (ref 96–112)
Creatinine, Ser: 0.9 mg/dL (ref 0.4–1.2)
GFR: 66.7 mL/min (ref >60.00)
Glucose, Bld: 112 mg/dL — ABNORMAL HIGH (ref 70–99)
Potassium: 3.6 mEq/L (ref 3.5–5.1)
Sodium: 141 mEq/L (ref 135–145)

## 2011-11-16 NOTE — Progress Notes (Signed)
Echocardiogram performed.  

## 2011-11-17 ENCOUNTER — Telehealth: Payer: Self-pay | Admitting: Neurology

## 2011-11-17 ENCOUNTER — Ambulatory Visit (INDEPENDENT_AMBULATORY_CARE_PROVIDER_SITE_OTHER): Payer: BC Managed Care – PPO | Admitting: Internal Medicine

## 2011-11-17 ENCOUNTER — Telehealth: Payer: Self-pay | Admitting: Cardiology

## 2011-11-17 ENCOUNTER — Encounter: Payer: Self-pay | Admitting: Internal Medicine

## 2011-11-17 VITALS — BP 162/86 | HR 108 | Temp 98.1°F | Ht 64.0 in | Wt 285.0 lb

## 2011-11-17 DIAGNOSIS — J9610 Chronic respiratory failure, unspecified whether with hypoxia or hypercapnia: Secondary | ICD-10-CM | POA: Diagnosis not present

## 2011-11-17 DIAGNOSIS — J449 Chronic obstructive pulmonary disease, unspecified: Secondary | ICD-10-CM

## 2011-11-17 NOTE — Telephone Encounter (Signed)
Called and spoke with Forreston. She apologized stating she had called the wrong office...was trying to call her cardiologist. No other concerns at this time.

## 2011-11-17 NOTE — Telephone Encounter (Signed)
Need to continue K[facility]

## 2011-11-17 NOTE — Telephone Encounter (Signed)
Pt reports that since Dr. Arbutus Leas increased her potassium dose, she has been "deathly nauseous." Would like to speak with someone about adjusting meds. Pt has another Appt at 3:15, so call before then.

## 2011-11-17 NOTE — Telephone Encounter (Signed)
plz return call to patient [phone] to discuss possible side affects with medication.

## 2011-11-17 NOTE — Patient Instructions (Addendum)
symbicort 160 one twice daily and see tremor better or breathing worse   If not satisfied first step:  See [name] NP with all your medications, even over the counter meds, separated in two separate bags, the ones you take no matter what vs the ones you stop once you feel better and take only as needed when you feel you need them.   [name]  will generate for you a new user friendly medication calendar that will put [place] all on the same page re: your medication use.     Without this process, it simply isn't possible to assure that we are providing  your outpatient care  with  the attention to detail we feel you deserve.   If we cannot assure that you're getting that kind of care,  then we cannot manage your problem effectively from this clinic.  Once you have seen [name] and we are sure that we're all on the same page with your medication use she will arrange follow up with me.   Late add: Chart review indicates stopped spiriva 08/2010 ? Why > ? rechallenge with tudorza if tremor from lower dose symbicort

## 2011-11-17 NOTE — Progress Notes (Signed)
Subjective:    Patient ID: [name] [name], female    DOB: 07/12/1950.   MRN: [id number]  HPI  30 yowf last smoked 2003 with COPD with minimum asthmatic component with an FEV1 of 55%, a ratio of 53%, and a diffusing capacity 51% documented 07/25/06. On 02 24 hours per day at 2lpm  6/09 weighed 271 and it was felt she was as debilitated as much by obesity as by airflow obstruction.  baseline = when well = walk to mother in law's house but stops at least once because it's uphill and frequently requiring rescue rx.    March 19, 2010-- Post hospital visit. Admitted 2/28-03/16/10( dc wt at 269 which was about 10 lbs down) for Acute on chronic resp failure secondary to decompensated diastolic heart failure . She was tx with aggressive diuresis. Conintued on coumadin. INR yesterday was 2.4. 2 D echo showed EF 55% w/ gr 2 diastolic dysfunction . VQ scan with low probability for PE. Since discharge some better but still real weak. Cough and congestion are some better. Edema worse this am, took with lasix that helped. no change in rx   09/29/2010 f/u ov/[name] [name] cc doe x has to stop x one or twice walking mailbox on 2lpm but overall feels this is marked improvement, still not able to lose wt.  No significant cough rec Only use your albuterol as a rescue medication to be used if you can't catch your breath by resting or doing a relaxed purse lip breathing pattern. The less you use it, the better it will work when you need it.   See calendar for specific medication instructions and bring it back for each and every office visit   Changed zyrtec to as needed only    01/01/2011 f/u ov/[name] [name] using med calendar well but not following contingencies cc increase doe and leg  swelling since last ov doesn't think 2lpm at rest is enough, uses 3lpm with activity- no cough or variability. Some better p saba but mostly using it daytime, not ever more than twice daily. rec See calendar for specific medication instructions     Separating the top medications from the bottom group is fundamental to providing you adequate care going forward.   Agree you should use an extra potassium when you take an extra as needed furosemide as per Dr Jonny Ruiz and Gholan's recommendation.  04/29/2011 f/u ov/[name] [name] cc no limiting sob, more legs R leg x 2 months, no cough rec Follow calendar    11/17/2011 f/u ov/[name] [name] no calendar, main complaint = tremor x one year, indolent onset.  No change activity tolerance on 3lpm when walking. No obvious daytime variabilty or assoc chronic cough or cp or chest tightness, subjective wheeze overt sinus or hb symptoms. No unusual exp hx   Sleeping ok without nocturnal  or early am exacerbation  of respiratory  c/o's or need for noct saba. Also denies any obvious fluctuation of symptoms with weather or environmental changes or other aggravating or alleviating factors except as outlined above   ROS  The following are not active complaints unless bolded sore throat, dysphagia, dental problems, itching, sneezing,  nasal congestion or excess/ purulent secretions, ear ache,   fever, chills, sweats, unintended wt loss, pleuritic or exertional cp, hemoptysis,  orthopnea pnd or leg swelling, presyncope, palpitations, heartburn, abdominal pain, anorexia, nausea, vomiting, diarrhea  or change in bowel or urinary habits, change in stools or urine, dysuria,hematuria,  rash, arthralgias, visual complaints, headache, numbness weakness or ataxia or problems  with walking or coordination,  change in mood/affect or memory.        Past Medical History:  CHRONIC RHINITIS (ICD-472.0)  EDEMA (ICD-782.3)  ACUTE AND CHRONIC RESPIRATORY FAILURE (ICD-518.84)  MORBID OBESITY (ICD-278.01)  - Target wt = 179 for BMI < 30 peak wt 282  - Referred back to nutrition December 18, 2007  DEEP VENOUS THROMBOPHLEBITIS, HX OF (ICD-V12.52)  COPD (ICD-496)..........................................................Marland KitchenWert  - PFTs 07/25/06 FEV1 55%  ratio 53, DLC0 51%  - HFA 50% December 16, 2009 > 75% March 27, 2010  DEPRESSION (ICD-311)  OSTEOPOROSIS (ICD-733.00) last BMD 4/08 -2.4, intolerant of bisphosphonates  HYPERTENSION (ICD-401.9)  Atrial fibrillation - paroxysmal  Anticoagulation therapy  Pulmonary embolism, hx of  neg stress myoview 10/07  Anxiety  glucose intolerance - on steroids  Hyperlipidemia  Complex medical regimen  - Med calendar done 05/01/2008, 08/26/2010              Objective:   Physical Exam  GEN: A/Ox3; pleasant , NAD, obese   Wt  274 09/01/10 >   09/29/2010  275 > 01/01/2011  280 > 04/29/2011  279 > 285 11/17/2011   HEENT:  [place]/AT,  EACs-clear, TMs-wnl, NOSE-clear drainage, THROAT-clear, no lesions, no postnasal drip or exudate noted.   NECK:  Supple w/ fair ROM; no JVD; normal carotid impulses w/o bruits; no thyromegaly or nodules palpated; no lymphadenopathy.  RESP  Clear  P & A; w/o, wheezes/ rales/ or rhonchi.no accessory muscle use, no dullness to percussion  CARD:  RRR, no m/r/g  , tr peripheral edema, pulses intact, no cyanosis or clubbing.  GI:   Soft & nt; nml bowel sounds; no organomegaly or masses detected.  Musco: Warm bil, no deformities or joint swelling noted.   Neuro: alert, no focal deficits noted, very min bilat resting tremor    Skin: Warm, no lesions or rashes         Assessment & Plan:

## 2011-11-17 NOTE — Telephone Encounter (Addendum)
**Note De-Identified  Obfuscation** Pt. was advised on 10/28 to increase Potassium to 40 meq. Bid due to low potassium level of 3.3. She repeated lab on 11/5 and potassium level was ok at 3.6. Pt. wants to know if she can decrease Potassium back to her normal dose of 20 meg bid as she c/o "feeling sick and having a metallic taste in her mouth". Pt. states she was unable to take additional dose this morning. Please advise.

## 2011-11-18 LAB — MYASTHENIA GRAVIS PANEL 2
Acetylcholine Rec Binding: <0.3 nmol/L
Acetylcholine Rec Mod Ab: 15 %
Aceytlcholine Rec Bloc Ab: <15 % of inhibition (ref <15)

## 2011-11-18 NOTE — Telephone Encounter (Signed)
Spoke with pt, Aware of dr crenshaw's recommendations.  °

## 2011-11-24 ENCOUNTER — Other Ambulatory Visit: Payer: Self-pay | Admitting: Cardiovascular Disease

## 2011-11-24 NOTE — Telephone Encounter (Signed)
Refilled Lasix

## 2011-11-25 NOTE — Assessment & Plan Note (Addendum)
-   PFTs 07/25/06 FEV1 55% ratio 53, DLC0 51%  - HFA 75% 04/29/2011   Adequate control on present rx, reviewed option to decrease symbicort 160 to one bid on trial basis to see if breathing changes at all or tremor changes - she had been on spiriva previously stopped 08/12/10 ? Why, consider rechallenge with Tudorza    Each maintenance medication was reviewed in detail including most importantly the difference between maintenance and as needed and under what circumstances the prns are to be used. This was done in the context of a medication calendar review which provided the patient with a user-friendly unambiguous mechanism for medication administration and reconciliation and provides an action plan for all active problems. It is critical that this be shown to every doctor  for modification during the office visit if necessary so the patient can use it as a working document.

## 2011-11-25 NOTE — Assessment & Plan Note (Addendum)
-   09/29/10  Walked 3lpm  2 laps @ 185 ft each stopped due to  Sob with sats 90%     - 11-17-11--o2 sat on ra at rest 83%ra   Adequate control on present rx, reviewed need to increase to 3lpm walking, 2lpm otherwise 24/7

## 2011-12-01 ENCOUNTER — Ambulatory Visit (INDEPENDENT_AMBULATORY_CARE_PROVIDER_SITE_OTHER): Payer: BC Managed Care – PPO | Admitting: General Practice

## 2011-12-01 DIAGNOSIS — Z86718 Personal history of other venous thrombosis and embolism: Secondary | ICD-10-CM

## 2011-12-01 DIAGNOSIS — Z8672 Personal history of thrombophlebitis: Secondary | ICD-10-CM

## 2011-12-01 DIAGNOSIS — I4892 Unspecified atrial flutter: Secondary | ICD-10-CM

## 2011-12-01 DIAGNOSIS — I4891 Unspecified atrial fibrillation: Secondary | ICD-10-CM

## 2011-12-01 DIAGNOSIS — Z7901 Long term (current) use of anticoagulants: Secondary | ICD-10-CM

## 2011-12-01 LAB — POCT INR: INR: 2.5

## 2011-12-29 ENCOUNTER — Ambulatory Visit (INDEPENDENT_AMBULATORY_CARE_PROVIDER_SITE_OTHER): Payer: BC Managed Care – PPO | Admitting: General Practice

## 2011-12-29 DIAGNOSIS — I4891 Unspecified atrial fibrillation: Secondary | ICD-10-CM | POA: Diagnosis not present

## 2011-12-29 DIAGNOSIS — Z8672 Personal history of thrombophlebitis: Secondary | ICD-10-CM

## 2011-12-29 DIAGNOSIS — I4892 Unspecified atrial flutter: Secondary | ICD-10-CM | POA: Diagnosis not present

## 2011-12-29 DIAGNOSIS — Z7901 Long term (current) use of anticoagulants: Secondary | ICD-10-CM

## 2011-12-29 DIAGNOSIS — Z86718 Personal history of other venous thrombosis and embolism: Secondary | ICD-10-CM

## 2011-12-29 LAB — POCT INR: INR: 2.5

## 2012-01-17 ENCOUNTER — Ambulatory Visit (INDEPENDENT_AMBULATORY_CARE_PROVIDER_SITE_OTHER): Payer: BC Managed Care – PPO | Admitting: Adult Health

## 2012-01-17 ENCOUNTER — Encounter: Payer: Self-pay | Admitting: Adult Health

## 2012-01-17 VITALS — BP 132/80 | HR 82 | Temp 97.6°F | Ht 64.0 in | Wt 285.0 lb

## 2012-01-17 DIAGNOSIS — J449 Chronic obstructive pulmonary disease, unspecified: Secondary | ICD-10-CM

## 2012-01-17 MED ORDER — LEVALBUTEROL HCL 1.25 MG/3ML IN NEBU: 1.2500 mg | INHALATION_SOLUTION | RESPIRATORY_TRACT | Status: DC | PRN

## 2012-01-17 MED ORDER — TRAMADOL HCL 50 MG PO TABS: 50.0000 mg | ORAL_TABLET | ORAL | Status: DC | PRN

## 2012-01-17 NOTE — Patient Instructions (Addendum)
Follow  med calendar closely and bring to each visit.  Begin Tudorza 1 puff Twice daily   Call back when ready to send to mail order .  Follow up Dr. Sherene Sires  In 3 months

## 2012-01-21 NOTE — Progress Notes (Signed)
Subjective:    Patient ID: [name] [name], female    DOB: 1950-10-15.   MRN: [id number]  HPI  30 yowf last smoked 2003 with COPD with minimum asthmatic component with an FEV1 of 55%, a ratio of 53%, and a diffusing capacity 51% documented 07/25/06. On 02 24 hours per day at 2lpm  6/09 weighed 271 and it was felt she was as debilitated as much by obesity as by airflow obstruction.  baseline = when well = walk to mother in law's house but stops at least once because it's uphill and frequently requiring rescue rx.    March 19, 2010-- Post hospital visit. Admitted 2/28-03/16/10( dc wt at 269 which was about 10 lbs down) for Acute on chronic resp failure secondary to decompensated diastolic heart failure . She was tx with aggressive diuresis. Conintued on coumadin. INR yesterday was 2.4. 2 D echo showed EF 55% w/ gr 2 diastolic dysfunction . VQ scan with low probability for PE. Since discharge some better but still real weak. Cough and congestion are some better. Edema worse this am, took with lasix that helped. no change in rx   09/29/2010 f/u ov/Wert cc doe x has to stop x one or twice walking mailbox on 2lpm but overall feels this is marked improvement, still not able to lose wt.  No significant cough rec Only use your albuterol as a rescue medication to be used if you can't catch your breath by resting or doing a relaxed purse lip breathing pattern. The less you use it, the better it will work when you need it.   See calendar for specific medication instructions and bring it back for each and every office visit   Changed zyrtec to as needed only    01/01/2011 f/u ov/Wert using med calendar well but not following contingencies cc increase doe and leg  swelling since last ov doesn't think 2lpm at rest is enough, uses 3lpm with activity- no cough or variability. Some better p saba but mostly using it daytime, not ever more than twice daily. rec See calendar for specific medication instructions     Separating the top medications from the bottom group is fundamental to providing you adequate care going forward.   Agree you should use an extra potassium when you take an extra as needed furosemide as per Dr Jonny Ruiz and Gholan's recommendation.  04/29/2011 f/u ov/Wert cc no limiting sob, more legs R leg x 2 months, no cough rec Follow calendar    11/17/2011 f/u ov/Wert no calendar, main complaint = tremor x one year, indolent onset.  No change activity tolerance on 3lpm when walking. No obvious daytime variabilty or assoc chronic cough or cp or chest tightness, subjective wheeze overt sinus or hb symptoms. No unusual exp hx   01/17/12 followup and medication review Patient returns for a two-month followup and medication review. Reviewed all her medications and organized them into a medication calendar    It appears that she is taking her medications correctly Last visit. She was decreased to Symbicort 1 puff twice daily to help with possible tremor. This does seem to help somewhat with her tremor She feels that her breathing is not quite as good with increased shortness of breath on the decreased dose of Symbicort, however, she's had no flare of cough and wheezing She denies any hemoptysis, orthopnea, PND, or increased leg swelling  Past Medical History:  CHRONIC RHINITIS (ICD-472.0)  EDEMA (ICD-782.3)  ACUTE AND CHRONIC RESPIRATORY FAILURE (ICD-518.84)  MORBID OBESITY (  ICD-278.01)  - Target wt = 179 for BMI < 30 peak wt 282  - Referred back to nutrition December 18, 2007  DEEP VENOUS THROMBOPHLEBITIS, HX OF (ICD-V12.52)  COPD (ICD-496)..........................................................Marland KitchenWert  - PFTs 07/25/06 FEV1 55% ratio 53, DLC0 51%  - HFA 50% December 16, 2009 > 75% March 27, 2010  DEPRESSION (ICD-311)  OSTEOPOROSIS (ICD-733.00) last BMD 4/08 -2.4, intolerant of bisphosphonates  HYPERTENSION (ICD-401.9)  Atrial fibrillation - paroxysmal  Anticoagulation therapy  Pulmonary  embolism, hx of  neg stress myoview 10/07  Anxiety  glucose intolerance - on steroids  Hyperlipidemia  Complex medical regimen  - Med calendar done 05/01/2008, 08/26/2010 , 01/17/12      ROS Constitutional:   No  weight loss, night sweats,  Fevers, chills,   HEENT:   No headaches,  Difficulty swallowing,  Tooth/dental problems, or  Sore throat,                No sneezing, itching, ear ache, nasal congestion, post nasal drip,   CV:  No chest pain,  Orthopnea, PND,   anasarca, dizziness, palpitations, syncope.   GI  No heartburn, indigestion, abdominal pain, nausea, vomiting, diarrhea, change in bowel habits, loss of appetite, bloody stools.   Resp:   No coughing up of blood.  No change in color of mucus.  No wheezing.  No chest wall deformity  Skin: no rash or lesions.  GU: no dysuria, change in color of urine, no urgency or frequency.  No flank pain, no hematuria   MS:  No joint pain or swelling.     Psych:  No change in mood or affect. No depression or anxiety.  No memory loss.             Objective:   Physical Exam  GEN: A/Ox3; pleasant , NAD, obese   Wt  274 09/01/10 >   09/29/2010  275 > 01/01/2011  280 > 04/29/2011  279 > 285 11/17/2011   HEENT:  [place]/AT,  EACs-clear, TMs-wnl, NOSE-clear drainage, THROAT-clear, no lesions, no postnasal drip or exudate noted.   NECK:  Supple w/ fair ROM; no JVD; normal carotid impulses w/o bruits; no thyromegaly or nodules palpated; no lymphadenopathy.  RESP  Diminshed BS in bases  w/o, wheezes/ rales/ or rhonchi.no accessory muscle use, no dullness to percussion  CARD:  RRR, no m/r/g  , tr peripheral edema, pulses intact, no cyanosis or clubbing.  GI:   Soft & nt; nml bowel sounds; no organomegaly or masses detected.  Musco: Warm bil, no deformities or joint swelling noted.   Neuro: alert, no focal deficits noted, very min bilat resting tremor    Skin: Warm, no lesions or rashes         Assessment & Plan:

## 2012-01-21 NOTE — Assessment & Plan Note (Signed)
Partially compensated with tendency towards frequent flares.  Trial of Tudorza  Patient's medications were reviewed today and patient education was given. Computerized medication calendar was adjusted/completed  Plan Follow  med calendar closely and bring to each visit.  Begin Tudorza 1 puff Twice daily   Call back when ready to send to mail order .  Follow up Dr. Sherene Sires  In 3 months

## 2012-01-26 ENCOUNTER — Ambulatory Visit (INDEPENDENT_AMBULATORY_CARE_PROVIDER_SITE_OTHER): Payer: BC Managed Care – PPO | Admitting: General Practice

## 2012-01-26 DIAGNOSIS — Z8672 Personal history of thrombophlebitis: Secondary | ICD-10-CM | POA: Diagnosis not present

## 2012-01-26 DIAGNOSIS — I4891 Unspecified atrial fibrillation: Secondary | ICD-10-CM | POA: Diagnosis not present

## 2012-01-26 DIAGNOSIS — Z86718 Personal history of other venous thrombosis and embolism: Secondary | ICD-10-CM

## 2012-01-26 DIAGNOSIS — I4892 Unspecified atrial flutter: Secondary | ICD-10-CM | POA: Diagnosis not present

## 2012-01-26 DIAGNOSIS — Z7901 Long term (current) use of anticoagulants: Secondary | ICD-10-CM

## 2012-01-26 LAB — POCT INR: INR: 3.2

## 2012-01-28 ENCOUNTER — Telehealth: Payer: Self-pay | Admitting: Adult Health

## 2012-01-28 MED ORDER — ACLIDINIUM BROMIDE 400 MCG/ACT IN AEPB: 1.0000 | INHALATION_SPRAY | Freq: Two times a day (BID) | RESPIRATORY_TRACT | Status: DC

## 2012-01-28 NOTE — Telephone Encounter (Signed)
Pt notified that rx for [name] [name] was sent to [facility] for 3 month supply.

## 2012-02-02 ENCOUNTER — Telehealth: Payer: Self-pay | Admitting: Internal Medicine

## 2012-02-02 NOTE — Telephone Encounter (Signed)
Made in error

## 2012-02-04 ENCOUNTER — Encounter: Payer: Self-pay | Admitting: Internal Medicine

## 2012-02-04 ENCOUNTER — Ambulatory Visit (INDEPENDENT_AMBULATORY_CARE_PROVIDER_SITE_OTHER): Payer: BC Managed Care – PPO | Admitting: Internal Medicine

## 2012-02-04 VITALS — BP 128/72 | HR 95 | Temp 98.1°F | Ht 64.0 in | Wt 282.0 lb

## 2012-02-04 DIAGNOSIS — I10 Essential (primary) hypertension: Secondary | ICD-10-CM

## 2012-02-04 DIAGNOSIS — R7302 Impaired glucose tolerance (oral): Secondary | ICD-10-CM

## 2012-02-04 DIAGNOSIS — R259 Unspecified abnormal involuntary movements: Secondary | ICD-10-CM | POA: Diagnosis not present

## 2012-02-04 DIAGNOSIS — R21 Rash and other nonspecific skin eruption: Secondary | ICD-10-CM | POA: Diagnosis not present

## 2012-02-04 DIAGNOSIS — R251 Tremor, unspecified: Secondary | ICD-10-CM

## 2012-02-04 DIAGNOSIS — R7309 Other abnormal glucose: Secondary | ICD-10-CM | POA: Diagnosis not present

## 2012-02-04 MED ORDER — NYSTATIN 100000 UNIT/GM EX POWD: CUTANEOUS | Status: DC

## 2012-02-04 NOTE — Assessment & Plan Note (Signed)
Not really improved with decrased symbicort so far, but mild, cont to follow

## 2012-02-04 NOTE — Assessment & Plan Note (Signed)
stable overall by history and exam, recent data reviewed with pt, and pt to continue medical treatment as before,  to f/u any worsening symptoms or concerns Lab Results  Component Value Date   HGBA1C 4.7 03/12/2011

## 2012-02-04 NOTE — Patient Instructions (Addendum)
Please take all new medication as prescribed - the powder (to [facility]) Please continue all other medications as before Please keep your appointments with your specialists as you have planned - Please return April 7, or sooner if needed, with Lab testing done 3-5 days before

## 2012-02-04 NOTE — Assessment & Plan Note (Signed)
stable overall by history and exam, recent data reviewed with pt, and pt to continue medical treatment as before,  to f/u any worsening symptoms or concerns BP Readings from Last 3 Encounters:  02/04/12 128/72  01/17/12 132/80  11/17/11 162/86

## 2012-02-04 NOTE — Assessment & Plan Note (Signed)
C/w yeast most likely - for nystatin powder asd,  to f/u any worsening symptoms or concerns

## 2012-02-04 NOTE — Progress Notes (Signed)
Subjective:    Patient ID: [name] [name], female    DOB: 1950/05/01, 62 y.o.   MRN: [id number]  HPI  Here to f/u; since seen here has had symbicort reduced to 1 puff bid from 2 puff bid as thought might be causing, tudorza added and she is quite pleased with sob/doe improvement, only c/o the cost, and still has some tremor to extremities (not better to her) and has what sounds like left eye periorbital fasciculations.  Echo nov 2013 with normal EF.  Last INR jan 15 - 3.2 with approp adjustent done.  No overt bleeding or bruising.  Has red rash under the breasts this wk with sweating at home. Pt denies recent chest pain, increased sob or doe, wheezing, orthopnea, PND, increased LE swelling, palpitations, dizziness or syncope. Pt denies new neurological symptoms such as new headache, or facial or extremity weakness or numbness   Pt denies polydipsia, polyuria,.  Pt states overall good compliance with meds  Past Medical History  Diagnosis Date  . Chronic rhinitis   . Acute and chronic respiratory failure   . Morbid obesity     target wt=179lb for BMI<30 Peak wt 282lb  . DVT (deep venous thrombosis)   . COPD (chronic obstructive pulmonary disease)     Wert. PFTs 07/25/06 FEV1 55% ratio 53, DLC0 51% HFA 50% 12/16/2009  . Depression   . Osteoporosis     last BMD 4/08 -2.4, intolerant of bisphosphonates  . Hypertension   . Arrhythmia     atrial fibrillation, anticoagulation therapy  . Anxiety   . Glucose intolerance (impaired glucose tolerance)     on steroids  . VITAMIN D DEFICIENCY 10/27/2007  . HYPERLIPIDEMIA 06/19/2007  . PREMATURE VENTRICULAR CONTRACTIONS 10/23/2008  . Atrial flutter 07/01/2008  . Rhabdomyolysis 07/29/2009  . OSTEOPOROSIS 07/29/2006  . FRACTURE, RIB, RIGHT 06/18/2009  . PULMONARY EMBOLISM, HX OF 06/19/2007  . Tremor 10/13/2011   Past Surgical History  Procedure Date  . Abdominal hysterectomy   . Tonsillectomy   . Skin graft to middle r finger 1975  . S/p left arm fracture  with fall off chair     reports that she quit smoking about 16 years ago. Her smoking use included Cigarettes. She has a 35 pack-year smoking history. She has never used smokeless tobacco. She reports that she does not drink alcohol or use illicit drugs. family history includes Asthma in her brother and son; Heart disease in her mother; and Throat cancer in her brother. Allergies  Allergen Reactions  . Duloxetine     REACTION: rhabdomyolysis  . Penicillins    Current Outpatient Prescriptions on File Prior to Visit  Medication Sig Dispense Refill  . acetaminophen (TYLENOL ARTHRITIS PAIN) 650 MG CR tablet Take as directed when needed for pain      . Aclidinium Bromide (TUDORZA PRESSAIR) 400 MCG/ACT AEPB Inhale 1 puff into the lungs 2 (two) times daily.  3 each  3  . amLODipine-valsartan (EXFORGE) 10-320 MG per tablet Take 1 tablet by mouth daily.  90 tablet  3  . atorvastatin (LIPITOR) 10 MG tablet Take 1 tablet (10 mg total) by mouth at bedtime.  90 tablet  3  . budesonide-formoterol (SYMBICORT) 160-4.5 MCG/ACT inhaler Inhale 2 puffs into the lungs 2 (two) times daily.  3 Inhaler  3  . Calcium Carbonate-Vitamin D 600-400 MG-UNIT per tablet Take 1 tablet by mouth 2 (two) times daily.        . Cetirizine HCl (ZYRTEC ALLERGY) 10 MG  CAPS Take 1 capsule (10 mg total) by mouth at bedtime.      . cholecalciferol (VITAMIN D) 1000 UNITS tablet Take 1,000 Units by mouth daily.        Marland Kitchen dextromethorphan-guaiFENesin (MUCINEX DM) 30-600 MG per 12 hr tablet 1 every 12 hours as needed w/flutter for cough/congestion      . FLUoxetine (PROZAC) 40 MG capsule Take 1 capsule (40 mg total) by mouth daily.  90 capsule  3  . furosemide (LASIX) 80 MG tablet TAKE 1 TABLET TWICE A DAY  180 tablet  1  . glucosamine-chondroitin 500-400 MG tablet Take 1 tablet by mouth daily.        Marland Kitchen levalbuterol (XOPENEX) 1.25 MG/3ML nebulizer solution Take 1.25 mg by nebulization every 4 (four) hours as needed.  300 mL  0  .  mometasone (NASONEX) 50 MCG/ACT nasal spray Place 1 spray into the nose 2 (two) times daily as needed.      Marland Kitchen omeprazole (PRILOSEC) 20 MG capsule Take 1 capsule (20 mg total) by mouth daily.  90 capsule  3  . oxymetazoline (AFRIN) 0.05 % nasal spray Place 2 sprays into the nose 2 (two) times daily as needed for congestion. For 5 days before nasonex      . potassium chloride SA (K-DUR,KLOR-CON) 20 MEQ tablet Take 1 tablet twice daily      . propafenone (RYTHMOL SR) 325 MG 12 hr capsule Take 1 capsule (325 mg total) by mouth 2 (two) times daily.  180 capsule  3  . sodium chloride (OCEAN) 0.65 % SOLN nasal spray 2 puffs every 4 hours as needed for nasal congestion      . traMADol (ULTRAM) 50 MG tablet Take 1 tablet (50 mg total) by mouth every 4 (four) hours as needed. For cough  45 tablet  1  . warfarin (COUMADIN) 7.5 MG tablet Take 1 tablet (7.5 mg total) by mouth as directed. Take as directed by coumadin clinic  120 tablet  1  . XOPENEX HFA 45 MCG/ACT inhaler INHALE 2 PUFFS EVERY 4 HOURS AS NEEDED  3 Inhaler  0  . nitroGLYCERIN (NITROSTAT) 0.4 MG SL tablet Place 1 tablet (0.4 mg total) under the tongue every 5 (five) minutes as needed for chest pain.  25 tablet  3   Review of Systems  Constitutional: Negative for unexpected weight change, or unusual diaphoresis  HENT: Negative for tinnitus.   Eyes: Negative for photophobia and visual disturbance.  Respiratory: Negative for choking and stridor.   Gastrointestinal: Negative for vomiting and blood in stool.  Genitourinary: Negative for hematuria and decreased urine volume.  Musculoskeletal: Negative for acute joint swelling Skin: Negative for color change and wound.  Neurological: Negative for tremors and numbness other than noted  Psychiatric/Behavioral: Negative for decreased concentration or  hyperactivity.       Objective:   Physical Exam BP 128/72  Pulse 95  Temp 98.1 F (36.7 C) (Oral)  Ht 5\' 4"  (1.626 m)  Wt 282 lb (127.914 kg)   BMI 48.41 kg/m2  SpO2 97% VS noted, not ill appearing, on home o2 portable Constitutional: Pt appears well-developed and well-nourished./obese  HENT: Head: NCAT.  Right Ear: External ear normal.  Left Ear: External ear normal.  Eyes: Conjunctivae and EOM are normal. Pupils are equal, round, and reactive to light.  Neck: Normal range of motion. Neck supple.  Cardiovascular: Normal rate and regular rhythm.   Pulmonary/Chest: Effort normal and breath sounds decreased, no rales Abd:  Soft, NT,  non-distended, + BS Neurological: Pt is alert. Not confused  Skin: Skin is warm. No erythema. except for large areas nontender but itchy candida like under bilat breast areas to the upper abd Psychiatric: Pt behavior is normal. Thought content normal.     Assessment & Plan:

## 2012-02-09 ENCOUNTER — Telehealth: Payer: Self-pay | Admitting: Internal Medicine

## 2012-02-09 NOTE — Telephone Encounter (Signed)
Spoke with patient-states that [facility] called her to see why a refill had not be requested in a while-pt told them that MW decreased her dose due to tremors-they explained to her that she needed to call our office to let MW know this. Pt states that using Symbicort 160/4.5 at 1 puff bid is still causing her to have tremors; she can tell a difference with her SOB since being on Tudorza. MW please advise. Pt is due to follow up with you in April-no OV scheduled at this time.

## 2012-02-09 NOTE — Telephone Encounter (Signed)
Pt states she will continue meds as prescribed. Carron Curie, CMA

## 2012-02-09 NOTE — Telephone Encounter (Signed)
If she's having problems (new or unaddressed symptoms) I need to see her asap - if it's just an insurance company concern then we'll address it at ov with no change in rx in meantim

## 2012-02-15 ENCOUNTER — Telehealth: Payer: Self-pay | Admitting: Internal Medicine

## 2012-02-15 NOTE — Telephone Encounter (Signed)
Spoke to pt, she received a letter stating that she needed to be recertified for O2.  She will see MW 02/18/12 @ 2:30 to get that done.

## 2012-02-18 ENCOUNTER — Encounter: Payer: Self-pay | Admitting: Internal Medicine

## 2012-02-18 ENCOUNTER — Ambulatory Visit (INDEPENDENT_AMBULATORY_CARE_PROVIDER_SITE_OTHER): Payer: BC Managed Care – PPO | Admitting: Internal Medicine

## 2012-02-18 ENCOUNTER — Ambulatory Visit: Payer: BC Managed Care – PPO | Admitting: Internal Medicine

## 2012-02-18 VITALS — BP 112/78 | HR 100 | Temp 97.8°F | Ht 64.0 in | Wt 288.2 lb

## 2012-02-18 DIAGNOSIS — J9610 Chronic respiratory failure, unspecified whether with hypoxia or hypercapnia: Secondary | ICD-10-CM

## 2012-02-18 DIAGNOSIS — J449 Chronic obstructive pulmonary disease, unspecified: Secondary | ICD-10-CM

## 2012-02-18 DIAGNOSIS — J4489 Other specified chronic obstructive pulmonary disease: Secondary | ICD-10-CM

## 2012-02-18 NOTE — Progress Notes (Signed)
Subjective:    Patient ID: [name] [name], female    DOB: 1950/09/07.   MRN: [id number]  HPI  22 yowf last smoked 2003 with COPD with minimum asthmatic component with an FEV1 of 55%, a ratio of 53%, and a diffusing capacity 51% documented 07/25/06. On 02 24 hours per day at 2lpm  6/09 weighed 271 and it was felt she was as debilitated as much by obesity as by airflow obstruction.  baseline = when well = walk to mother in law's house but stops at least once because it's uphill and frequently requiring rescue rx.    March 19, 2010-- Post hospital visit. Admitted 2/28-03/16/10( dc wt at 269 which was about 10 lbs down) for Acute on chronic resp failure secondary to decompensated diastolic heart failure . She was tx with aggressive diuresis. Conintued on coumadin. INR yesterday was 2.4. 2 D echo showed EF 55% w/ gr 2 diastolic dysfunction . VQ scan with low probability for PE. Since discharge some better but still real weak. Cough and congestion are some better. Edema worse this am, took with lasix that helped. rec  no change in rx    11/17/2011 f/u ov/Leviticus Harton no calendar, main complaint = tremor x one year, indolent onset.  No change activity tolerance on 3lpm when walking. rec  Try symbicort 160 one bid to reduce tremor    01/17/12 followup and medication review Patient returns for a two-month followup and medication review. Reviewed all her medications and organized them into a medication calendar    It appears that she is taking her medications correctly Last visit. She was decreased to Symbicort 1 puff twice daily to help with possible tremor. This does seem to help somewhat with her tremor She feels that her breathing is not quite as good with increased shortness of breath on the decreased dose of Symbicort, however, she's had no flare of cough and wheezing rec  Begin Tudorza 1 puff Twice daily > better    02/18/2012 f/u ov/Anwitha Mapes cc better until 2 days prior to OV  With congested cough, white  mucus, not using hfa saba or neb saba yet, not optimizing action plan on med calendar.  No obvious daytime variabilty or assoc purulent sputum cp or chest tightness, subjective wheeze overt sinus or hb symptoms. No unusual exp hx   Sleeping ok without nocturnal  or early am exacerbation  of respiratory  c/o's or need for noct saba. Also denies any obvious fluctuation of symptoms with weather or environmental changes or other aggravating or alleviating factors except as outlined above   ROS  The following are not active complaints unless bolded sore throat, dysphagia, dental problems, itching, sneezing,  nasal congestion or excess/ purulent secretions, ear ache,   fever, chills, sweats, unintended wt loss, pleuritic or exertional cp, hemoptysis,  orthopnea pnd or leg swelling, presyncope, palpitations, heartburn, abdominal pain, anorexia, nausea, vomiting, diarrhea  or change in bowel or urinary habits, change in stools or urine, dysuria,hematuria,  rash, arthralgias, visual complaints, headache, numbness weakness or ataxia or problems with walking or coordination,  change in mood/affect or memory.      Past Medical History:  CHRONIC RHINITIS (ICD-472.0)  EDEMA (ICD-782.3)  ACUTE AND CHRONIC RESPIRATORY FAILURE (ICD-518.84)  MORBID OBESITY (ICD-278.01)  - Target wt = 179 for BMI < 30 peak wt 282  - Referred back to nutrition December 18, 2007  DEEP VENOUS THROMBOPHLEBITIS, HX OF (ICD-V12.52)  COPD (ICD-496)..........................................................Marland KitchenWert  - PFTs 07/25/06 FEV1 55% ratio 53, DLC0  51%  - HFA 50% December 16, 2009 > 75% March 27, 2010  DEPRESSION (ICD-311)  OSTEOPOROSIS (ICD-733.00) last BMD 4/08 -2.4, intolerant of bisphosphonates  HYPERTENSION (ICD-401.9)  Atrial fibrillation - paroxysmal  Anticoagulation therapy  Pulmonary embolism, hx of  neg stress myoview 10/07  Anxiety  glucose intolerance - on steroids  Hyperlipidemia  Complex medical regimen  - Med  calendar done 05/01/2008, 08/26/2010 , 01/17/12          Objective:   Physical Exam  GEN: A/Ox3; pleasant , NAD, obese wf nad  Wt  274 09/01/10 >   09/29/2010  275 > 01/01/2011  280 > 04/29/2011  279 > 285 11/17/2011  > 288 02/18/2012   HEENT:  [place]/AT,  EACs-clear, TMs-wnl, NOSE-clear drainage, THROAT-clear, no lesions, no postnasal drip or exudate noted.   NECK:  Supple w/ fair ROM; no JVD; normal carotid impulses w/o bruits; no thyromegaly or nodules palpated; no lymphadenopathy.  RESP  Diminshed BS in bases  w/o, wheezes/ rales/ or rhonchi.no accessory muscle use, no dullness to percussion  CARD:  RRR, no m/r/g  , tr peripheral edema, pulses intact, no cyanosis or clubbing.  GI:   Soft & nt; nml bowel sounds; no organomegaly or masses detected.  Musco: Warm bil, no deformities or joint swelling noted.   Neuro: alert, no focal deficits noted, very min bilat resting tremor    Skin: Warm, no lesions or rashes         Assessment & Plan:

## 2012-02-18 NOTE — Patient Instructions (Addendum)
Increase symbicort to 160 Take 2 puffs first thing in am and then another 2 puffs about 12 hours later until better then back to one twice daily  02 is 2lpm 24/7 x outside the house walk on 3lpm pulsed   Work on inhaler technique:  relax and gently blow all the way out then take a nice smooth deep breath back in, triggering the inhaler at same time you start breathing in.  Hold for up to 5 seconds if you can.  Rinse and gargle with water when done   If your mouth or throat starts to bother you,   I suggest you time the inhaler to your dental care and after using the inhaler(s) brush teeth and tongue with a baking soda containing toothpaste and when you rinse this out, gargle with it first to see if this helps your mouth and throat.     See calendar for specific medication instructions and bring it back for each and every office visit for every healthcare provider you see.  Without it,  you may not receive the best quality medical care that we feel you deserve.  You will note that the calendar groups together  your maintenance  medications that are timed at particular times of the day.  Think of this as your checklist for what your doctor has instructed you to do until your next evaluation to see what benefit  there is  to staying on a consistent group of medications intended to keep you well.  The other group at the bottom is entirely up to you to use as you see fit  for specific symptoms that may arise between visits that require you to treat them on an as needed basis.  Think of this as your action plan or "what if" list.   Separating the top medications from the bottom group is fundamental to providing you adequate care going forward.     Late add :  Ov 3 months with pfts

## 2012-02-19 NOTE — Assessment & Plan Note (Signed)
-   09/29/10  Walked 3lpm  2 laps @ 185 ft each stopped due to  Sob with sats 90%     - HC03 11/08/11 = 34     - 11-17-11--o2 sat on ra at rest 83%ra     - 02/18/2012  Sat 88% RA across the room > Walked 3lpm x 3 laps @ 185 ft each stopped due to  End of study no desat  rx is 2lpm at rest/ sleep/ 3lpm with ex - note hypercarbic as well likely OHS > copd

## 2012-02-19 NOTE — Assessment & Plan Note (Addendum)
-   PFTs 07/25/06 FEV1 55% ratio 53, DLC0 51%  - HFA 75% 04/29/2011  -med calendar 01/17/2012  -Tudorza trial  01/17/12   GOLD II / morbidly obese and 02 dep resp failure but overall doing well with very mild flare at present not even needing Plan B = hfa xopenex much less plan C = neb xopenex - can probably manage this one by simply increasing symbicort back to 2 bid albeit with some tremor expected    Each maintenance medication was reviewed in detail including most importantly the difference between maintenance and as needed and under what circumstances the prns are to be used. This was done in the context of a medication calendar review which provided the patient with a user-friendly unambiguous mechanism for medication administration and reconciliation and provides an action plan for all active problems. It is critical that this be shown to every doctor  for modification during the office visit if necessary so the patient can use it as a working document.

## 2012-02-25 ENCOUNTER — Other Ambulatory Visit: Payer: Self-pay | Admitting: Internal Medicine

## 2012-02-28 ENCOUNTER — Telehealth: Payer: Self-pay | Admitting: Internal Medicine

## 2012-02-28 NOTE — Telephone Encounter (Signed)
Message routed to scheduler.

## 2012-02-28 NOTE — Telephone Encounter (Signed)
Appt on Tues Feb. 18.

## 2012-02-28 NOTE — Telephone Encounter (Signed)
Patient Information:  Caller Name: Nitzia  Phone: [phone]  Patient: [name] [name], [name] [name]  Gender: Female  DOB: [birth year]  Age: 62 Years  PCP: Oliver Barre (Adults only)  Office Follow Up:  Does the office need to follow up with this patient?: Yes  Instructions For The Office: Requesting appointment for approximately 11:00 02/29/12 after lab visit; please call back  RN Note:  Cough is worsening. No audible wheezing when talking but wheezing audble when coughs. Last used Albuterol last week. Chest tightness present for 2 weeks. Denies shortness of breath. Triage interruped with multiple coughing fits.  Unbable to locate peak flow meter measures (normal rate is unknown-"I'm to sick to remember right now.") Instructed to use Xopenex HFA every 4-6 hours prn hard cough, wheezing or chest tightness. Located peak flow meter; currently in red zone.  Agreed to use Xopenex every 4-6 hours.  No appointments remain for 02/28/12.  Requests to be seen 02/29/12 since she has Coumadin clinic at 1045.  Same day appoinments for tomorrow need MD aproval.  Please call back to advise if can be scheduled for 02/29/12 before or after lab appointment.    Symptoms  Reason For Call & Symptoms: Sinus infection with frequent nosebleeds.  Asking for Rx to be called in. Deep, productive cough with green phlem.  Reviewed Health History In EMR: Yes  Reviewed Medications In EMR: Yes  Reviewed Allergies In EMR: Yes  Reviewed Surgeries / Procedures: Yes  Date of Onset of Symptoms: 02/18/2012  Treatments Tried: Albuterol neb used last week, Symbicort BID  Treatments Tried Worked: No  Guideline(s) Used:  Asthma Attack  Disposition Per Guideline:   See Today in Office  Reason For Disposition Reached:   Coughing continuously (nonstop) that keeps from working or sleeping, and not improved after inhaler or nebulizer  Advice Given:  Quick-Relief Asthma Medicine:   Start your quick-relief medicine (e.g., albuterol, salbutamol)  at the first sign of any coughing or shortness of breath (don't wait for wheezing). Use your inhaler (2 puffs each time) or nebulizer every 4 hours. Continue the quick-relief medicine until you have not wheezed or coughed for 48 hours.  The best "cough medicine" for an adult with asthma is always the asthma medicine (Note: Don't use cough suppressants, but cough drops may help a tickly cough).  Long-Term-Control Asthma Medicine:  If you are using a controller medicine (e.g., inhaled steroids or cromolyn), continue to take it as directed.  Drinking Liquids:  Try to drink normal amount of liquids (e.g., water). Being adequately hydrated makes it easier to cough up the sticky lung mucus.  Humidifier:   If the air is dry, use a cool mist humidifier to prevent drying of the upper airway.  Call Back If:  Inhaled asthma medicine (nebulizer or inhaler) is needed more often than every 4 hours  Wheezing has not completely cleared after 5 days  You become worse.

## 2012-02-29 ENCOUNTER — Encounter: Payer: Self-pay | Admitting: Internal Medicine

## 2012-02-29 ENCOUNTER — Ambulatory Visit (INDEPENDENT_AMBULATORY_CARE_PROVIDER_SITE_OTHER): Payer: BC Managed Care – PPO | Admitting: Internal Medicine

## 2012-02-29 ENCOUNTER — Ambulatory Visit (INDEPENDENT_AMBULATORY_CARE_PROVIDER_SITE_OTHER): Payer: BC Managed Care – PPO | Admitting: General Practice

## 2012-02-29 VITALS — BP 112/70 | HR 75 | Temp 97.5°F | Ht 64.0 in | Wt 282.8 lb

## 2012-02-29 DIAGNOSIS — I4892 Unspecified atrial flutter: Secondary | ICD-10-CM | POA: Diagnosis not present

## 2012-02-29 DIAGNOSIS — R7302 Impaired glucose tolerance (oral): Secondary | ICD-10-CM

## 2012-02-29 DIAGNOSIS — J209 Acute bronchitis, unspecified: Secondary | ICD-10-CM | POA: Diagnosis not present

## 2012-02-29 DIAGNOSIS — Z7901 Long term (current) use of anticoagulants: Secondary | ICD-10-CM

## 2012-02-29 DIAGNOSIS — R7309 Other abnormal glucose: Secondary | ICD-10-CM

## 2012-02-29 DIAGNOSIS — I4891 Unspecified atrial fibrillation: Secondary | ICD-10-CM | POA: Diagnosis not present

## 2012-02-29 DIAGNOSIS — I10 Essential (primary) hypertension: Secondary | ICD-10-CM

## 2012-02-29 DIAGNOSIS — Z8672 Personal history of thrombophlebitis: Secondary | ICD-10-CM | POA: Diagnosis not present

## 2012-02-29 DIAGNOSIS — J441 Chronic obstructive pulmonary disease with (acute) exacerbation: Secondary | ICD-10-CM | POA: Diagnosis not present

## 2012-02-29 DIAGNOSIS — Z86718 Personal history of other venous thrombosis and embolism: Secondary | ICD-10-CM | POA: Diagnosis not present

## 2012-02-29 LAB — POCT INR: INR: 3.3

## 2012-02-29 MED ORDER — PREDNISONE 10 MG PO TABS: ORAL_TABLET | ORAL | Status: DC

## 2012-02-29 MED ORDER — METHYLPREDNISOLONE ACETATE 80 MG/ML IJ SUSP
80.0000 mg | Freq: Once | INTRAMUSCULAR | Status: AC
  Administered 2012-02-29: 80 mg via INTRAMUSCULAR

## 2012-02-29 MED ORDER — LEVOFLOXACIN 250 MG PO TABS: 250.0000 mg | ORAL_TABLET | Freq: Every day | ORAL | Status: DC

## 2012-02-29 MED ORDER — HYDROCODONE-HOMATROPINE 5-1.5 MG/5ML PO SYRP: 5.0000 mL | ORAL_SOLUTION | Freq: Four times a day (QID) | ORAL | Status: DC | PRN

## 2012-02-29 NOTE — Patient Instructions (Signed)
You had the steroid shot today Please take all new medication as prescribed - the antibiotic, prednisone, and cough medicine Please continue all other medications as before Please have the pharmacy call with any other refills you may need. Please call for any worsening, as we could consider a CXR Thank you for enrolling in MyChart. Please follow the instructions below to securely access your online medical record. MyChart allows you to send messages to your doctor, view your test results, renew your prescriptions, schedule appointments, and more. To Log into My Chart online, please go by [employer] or [employer] to [email or web address], or download the MyChart App from the [employer] .  Your Username is: [email or web address] ([email or web address]) Please send a practice Message on Mychart later today. Please return in 6 months, or sooner if needed, with Lab testing done 3-5 days before

## 2012-02-29 NOTE — Progress Notes (Signed)
Subjective:    Patient ID: [name] [name], female    DOB: September 14, 1950, 63 y.o.   MRN: [id number]  HPI   Here with 2 wks acute onset fever, facial pain, pressure, headache, general weakness and malaise, and greenish d/c, with mild ST and cough with increased wheeze/sob mild,, but pt denies chest pain,orthopnea, PND, increased LE swelling, palpitations, dizziness or syncope.   Pt denies polydipsia, polyuria.  Pt denies new neurological symptoms such as new headache, or facial or extremity weakness or numbness Past Medical History  Diagnosis Date  . Chronic rhinitis   . Acute and chronic respiratory failure   . Morbid obesity     target wt=179lb for BMI<30 Peak wt 282lb  . DVT (deep venous thrombosis)   . COPD (chronic obstructive pulmonary disease)     Wert. PFTs 07/25/06 FEV1 55% ratio 53, DLC0 51% HFA 50% 12/16/2009  . Depression   . Osteoporosis     last BMD 4/08 -2.4, intolerant of bisphosphonates  . Hypertension   . Arrhythmia     atrial fibrillation, anticoagulation therapy  . Anxiety   . Glucose intolerance (impaired glucose tolerance)     on steroids  . VITAMIN D DEFICIENCY 10/27/2007  . HYPERLIPIDEMIA 06/19/2007  . PREMATURE VENTRICULAR CONTRACTIONS 10/23/2008  . Atrial flutter 07/01/2008  . Rhabdomyolysis 07/29/2009  . OSTEOPOROSIS 07/29/2006  . FRACTURE, RIB, RIGHT 06/18/2009  . PULMONARY EMBOLISM, HX OF 06/19/2007  . Tremor 10/13/2011   Past Surgical History  Procedure Laterality Date  . Abdominal hysterectomy    . Tonsillectomy    . Skin graft to middle r finger  1975  . S/p left arm fracture with fall off chair      reports that she quit smoking about 16 years ago. Her smoking use included Cigarettes. She has a 35 pack-year smoking history. She has never used smokeless tobacco. She reports that she does not drink alcohol or use illicit drugs. family history includes Asthma in her brother and son; Heart disease in her mother; and Throat cancer in her brother. Allergies   Allergen Reactions  . Duloxetine     REACTION: rhabdomyolysis  . Penicillins    Current Outpatient Prescriptions on File Prior to Visit  Medication Sig Dispense Refill  . acetaminophen (TYLENOL ARTHRITIS PAIN) 650 MG CR tablet Take as directed when needed for pain      . Aclidinium Bromide (TUDORZA PRESSAIR) 400 MCG/ACT AEPB Inhale 1 puff into the lungs 2 (two) times daily.  3 each  3  . amLODipine-valsartan (EXFORGE) 10-320 MG per tablet Take 1 tablet by mouth daily.  90 tablet  3  . atorvastatin (LIPITOR) 10 MG tablet Take 1 tablet (10 mg total) by mouth at bedtime.  90 tablet  3  . atorvastatin (LIPITOR) 10 MG tablet TAKE 1 TABLET (10 MG TOTAL) AT BEDTIME  90 tablet  2  . budesonide-formoterol (SYMBICORT) 160-4.5 MCG/ACT inhaler Inhale 2 puffs into the lungs 2 (two) times daily.  3 Inhaler  3  . Calcium Carbonate-Vitamin D 600-400 MG-UNIT per tablet Take 1 tablet by mouth 2 (two) times daily.        . Cetirizine HCl (ZYRTEC ALLERGY) 10 MG CAPS Take 1 capsule (10 mg total) by mouth at bedtime.      . cholecalciferol (VITAMIN D) 1000 UNITS tablet Take 1,000 Units by mouth daily.        Marland Kitchen dextromethorphan-guaiFENesin (MUCINEX DM) 30-600 MG per 12 hr tablet 1 every 12 hours as needed w/flutter for cough/congestion      .  FLUoxetine (PROZAC) 40 MG capsule Take 1 capsule (40 mg total) by mouth daily.  90 capsule  3  . furosemide (LASIX) 80 MG tablet TAKE 1 TABLET TWICE A DAY  180 tablet  1  . glucosamine-chondroitin 500-400 MG tablet Take 1 tablet by mouth daily.        Marland Kitchen levalbuterol (XOPENEX) 1.25 MG/3ML nebulizer solution Take 1.25 mg by nebulization every 4 (four) hours as needed.  300 mL  0  . mometasone (NASONEX) 50 MCG/ACT nasal spray Place 1 spray into the nose 2 (two) times daily as needed.      . nystatin (MYCOSTATIN) powder Use as directed to affected area twice per day as needed  30 g  1  . omeprazole (PRILOSEC) 20 MG capsule Take 1 capsule (20 mg total) by mouth daily.  90 capsule   3  . oxymetazoline (AFRIN) 0.05 % nasal spray Place 2 sprays into the nose 2 (two) times daily as needed for congestion. For 5 days before nasonex      . potassium chloride SA (K-DUR,KLOR-CON) 20 MEQ tablet Take 1 tablet twice daily      . propafenone (RYTHMOL SR) 325 MG 12 hr capsule Take 1 capsule (325 mg total) by mouth 2 (two) times daily.  180 capsule  3  . sodium chloride (OCEAN) 0.65 % SOLN nasal spray 2 puffs every 4 hours as needed for nasal congestion      . traMADol (ULTRAM) 50 MG tablet Take 1 tablet (50 mg total) by mouth every 4 (four) hours as needed. For cough  45 tablet  1  . warfarin (COUMADIN) 7.5 MG tablet Take 1 tablet (7.5 mg total) by mouth as directed. Take as directed by coumadin clinic  120 tablet  1  . XOPENEX HFA 45 MCG/ACT inhaler INHALE 2 PUFFS EVERY 4 HOURS AS NEEDED  3 Inhaler  0  . nitroGLYCERIN (NITROSTAT) 0.4 MG SL tablet Place 1 tablet (0.4 mg total) under the tongue every 5 (five) minutes as needed for chest pain.  25 tablet  3   No current facility-administered medications on file prior to visit.   Review of Systems  Constitutional: Negative for unexpected weight change, or unusual diaphoresis  HENT: Negative for tinnitus.   Eyes: Negative for photophobia and visual disturbance.  Respiratory: Negative for choking and stridor.   Gastrointestinal: Negative for vomiting and blood in stool.  Genitourinary: Negative for hematuria and decreased urine volume.  Musculoskeletal: Negative for acute joint swelling Skin: Negative for color change and wound.  Neurological: Negative for tremors and numbness other than noted  Psychiatric/Behavioral: Negative for decreased concentration or  hyperactivity.       Objective:   Physical Exam BP 112/70  Pulse 75  Temp(Src) 97.5 F (36.4 C) (Oral)  Ht 5\' 4"  (1.626 m)  Wt 282 lb 12 oz (128.255 kg)  BMI 48.51 kg/m2  SpO2 97% VS noted, mild ill Constitutional: Pt appears well-developed and well-nourished.  HENT:  Head: NCAT.  Right Ear: External ear normal.  Left Ear: External ear normal.  Eyes: Conjunctivae and EOM are normal. Pupils are equal, round, and reactive to light.  Bilat tm's with mild erythema.  Max sinus areas mild tender.  Pharynx with mild erythema, no exudate Neck: Normal range of motion. Neck supple.  Cardiovascular: Normal rate and regular rhythm.   Pulmonary/Chest: Effort normal and breath sounds decreased with few bilat wheeze.  Neurological: Pt is alert. Not confused  Skin: Skin is warm. No erythema.  Psychiatric:  Pt behavior is normal. Thought content normal.     Assessment & Plan:

## 2012-03-03 ENCOUNTER — Ambulatory Visit (INDEPENDENT_AMBULATORY_CARE_PROVIDER_SITE_OTHER): Payer: BC Managed Care – PPO | Admitting: General Practice

## 2012-03-03 DIAGNOSIS — Z7901 Long term (current) use of anticoagulants: Secondary | ICD-10-CM

## 2012-03-03 DIAGNOSIS — I4891 Unspecified atrial fibrillation: Secondary | ICD-10-CM

## 2012-03-03 DIAGNOSIS — I4892 Unspecified atrial flutter: Secondary | ICD-10-CM

## 2012-03-03 DIAGNOSIS — Z8672 Personal history of thrombophlebitis: Secondary | ICD-10-CM | POA: Diagnosis not present

## 2012-03-03 DIAGNOSIS — Z86718 Personal history of other venous thrombosis and embolism: Secondary | ICD-10-CM | POA: Diagnosis not present

## 2012-03-03 LAB — POCT INR: INR: 2.1

## 2012-03-04 NOTE — Assessment & Plan Note (Addendum)
Mild to mod, for predpack asd and depomedrol IM,  to f/u any worsening symptoms or concerns

## 2012-03-04 NOTE — Assessment & Plan Note (Signed)
stable overall by history and exam, recent data reviewed with pt, and pt to continue medical treatment as before,  to f/u any worsening symptoms or concerns BP Readings from Last 3 Encounters:  02/29/12 112/70  02/18/12 112/78  02/04/12 128/72

## 2012-03-04 NOTE — Assessment & Plan Note (Signed)
stable overall by history and exam, recent data reviewed with pt, and pt to continue medical treatment as before,  to f/u any worsening symptoms or concerns Lab Results  Component Value Date   HGBA1C 4.7 03/12/2011    

## 2012-03-04 NOTE — Assessment & Plan Note (Signed)
Mild to mod, for antibx course,  to f/u any worsening symptoms or concerns 

## 2012-03-23 DIAGNOSIS — E669 Obesity, unspecified: Secondary | ICD-10-CM | POA: Diagnosis not present

## 2012-03-23 DIAGNOSIS — Z803 Family history of malignant neoplasm of breast: Secondary | ICD-10-CM | POA: Diagnosis not present

## 2012-03-23 DIAGNOSIS — Z01419 Encounter for gynecological examination (general) (routine) without abnormal findings: Secondary | ICD-10-CM | POA: Diagnosis not present

## 2012-03-23 DIAGNOSIS — Z0000 Encounter for general adult medical examination without abnormal findings: Secondary | ICD-10-CM | POA: Diagnosis not present

## 2012-03-28 ENCOUNTER — Ambulatory Visit: Payer: BC Managed Care – PPO | Admitting: Internal Medicine

## 2012-03-30 ENCOUNTER — Encounter: Payer: Self-pay | Admitting: Internal Medicine

## 2012-03-30 ENCOUNTER — Ambulatory Visit (INDEPENDENT_AMBULATORY_CARE_PROVIDER_SITE_OTHER): Payer: BC Managed Care – PPO | Admitting: Internal Medicine

## 2012-03-30 VITALS — BP 142/72 | HR 88 | Temp 98.2°F | Ht 64.0 in | Wt 279.4 lb

## 2012-03-30 DIAGNOSIS — X838XXA Intentional self-harm by other specified means, initial encounter: Secondary | ICD-10-CM | POA: Diagnosis not present

## 2012-03-30 DIAGNOSIS — F329 Major depressive disorder, single episode, unspecified: Secondary | ICD-10-CM

## 2012-03-30 DIAGNOSIS — G5600 Carpal tunnel syndrome, unspecified upper limb: Secondary | ICD-10-CM

## 2012-03-30 DIAGNOSIS — G5602 Carpal tunnel syndrome, left upper limb: Secondary | ICD-10-CM

## 2012-03-30 MED ORDER — ARIPIPRAZOLE 5 MG PO TABS: ORAL_TABLET | ORAL | Status: DC

## 2012-03-30 NOTE — Patient Instructions (Addendum)
Please take all new medication as prescribed - the HALF of the 5 mg Abilify Please wear the left wrist splint at night only until numbness is improved If the numbness does not improved, we could consider a Nerve test for the arms to check the nerve function, or refer to Neurosurgury for further evaluation Please continue all other medications as before, including the prozac 40 mg You will be contacted regarding the referral for: counseling (psychology) AND psychiatry  (please see [facility] before leaving today) No lab work needed today Thank you for enrolling in MyChart. Please follow the instructions below to securely access your online medical record. MyChart allows you to send messages to your doctor, view your test results, renew your prescriptions, schedule appointments, and more.  Please go to the ER at any time, or [facility] (during regular office hours), for any further plans or thinking of trying to hurt yourself or suicide

## 2012-03-30 NOTE — Progress Notes (Signed)
Subjective:    Patient ID: [name] [name], female    DOB: 1950-08-05, 62 y.o.   MRN: [id number]  HPI  Here with c/o 2-3 wks worsening depressive symptoms with low mood, anhedonia and a sense of numbness, felt so low about 5 days ago she scratched/cut herself superficially numerous times to left anterior forearm with kitchen knife, "I just didn't feel it, and I just didn't care."  Does also mention a numbness to distal UE about the same area but no weakness or pain per se, and scratches/cuts are healing well it seems without cellulitis.  Of note, On nitrofurantoin now per GYN after pap and abnormal UA per pt.  Denies urinary symptoms such as dysuria, frequency, urgency, flank pain, hematuria or n/v, fever, chills.  No hx of Carpal tunnel but has mult risk factors.  Asks and agrees she needs to see counselor and psychiatrist, has not seen either for several years.No psychosis symptoms and denies further suicidal intent or plan Past Medical History  Diagnosis Date  . Chronic rhinitis   . Acute and chronic respiratory failure   . Morbid obesity     target wt=179lb for BMI<30 Peak wt 282lb  . DVT (deep venous thrombosis)   . COPD (chronic obstructive pulmonary disease)     Wert. PFTs 07/25/06 FEV1 55% ratio 53, DLC0 51% HFA 50% 12/16/2009  . Depression   . Osteoporosis     last BMD 4/08 -2.4, intolerant of bisphosphonates  . Hypertension   . Arrhythmia     atrial fibrillation, anticoagulation therapy  . Anxiety   . Glucose intolerance (impaired glucose tolerance)     on steroids  . VITAMIN D DEFICIENCY 10/27/2007  . HYPERLIPIDEMIA 06/19/2007  . PREMATURE VENTRICULAR CONTRACTIONS 10/23/2008  . Atrial flutter 07/01/2008  . Rhabdomyolysis 07/29/2009  . OSTEOPOROSIS 07/29/2006  . FRACTURE, RIB, RIGHT 06/18/2009  . PULMONARY EMBOLISM, HX OF 06/19/2007  . Tremor 10/13/2011   Past Surgical History  Procedure Laterality Date  . Abdominal hysterectomy    . Tonsillectomy    . Skin graft to middle r finger   1975  . S/p left arm fracture with fall off chair      reports that she quit smoking about 16 years ago. Her smoking use included Cigarettes. She has a 35 pack-year smoking history. She has never used smokeless tobacco. She reports that she does not drink alcohol or use illicit drugs. family history includes Asthma in her brother and son; Heart disease in her mother; and Throat cancer in her brother. Allergies  Allergen Reactions  . Duloxetine     REACTION: rhabdomyolysis  . Penicillins    Current Outpatient Prescriptions on File Prior to Visit  Medication Sig Dispense Refill  . acetaminophen (TYLENOL ARTHRITIS PAIN) 650 MG CR tablet Take as directed when needed for pain      . Aclidinium Bromide (TUDORZA PRESSAIR) 400 MCG/ACT AEPB Inhale 1 puff into the lungs 2 (two) times daily.  3 each  3  . amLODipine-valsartan (EXFORGE) 10-320 MG per tablet Take 1 tablet by mouth daily.  90 tablet  3  . atorvastatin (LIPITOR) 10 MG tablet Take 1 tablet (10 mg total) by mouth at bedtime.  90 tablet  3  . budesonide-formoterol (SYMBICORT) 160-4.5 MCG/ACT inhaler Inhale 2 puffs into the lungs 2 (two) times daily.  3 Inhaler  3  . Calcium Carbonate-Vitamin D 600-400 MG-UNIT per tablet Take 1 tablet by mouth 2 (two) times daily.        Marland Kitchen  Cetirizine HCl (ZYRTEC ALLERGY) 10 MG CAPS Take 1 capsule (10 mg total) by mouth at bedtime.      . cholecalciferol (VITAMIN D) 1000 UNITS tablet Take 1,000 Units by mouth daily.        Marland Kitchen dextromethorphan-guaiFENesin (MUCINEX DM) 30-600 MG per 12 hr tablet 1 every 12 hours as needed w/flutter for cough/congestion      . FLUoxetine (PROZAC) 40 MG capsule Take 1 capsule (40 mg total) by mouth daily.  90 capsule  3  . furosemide (LASIX) 80 MG tablet TAKE 1 TABLET TWICE A DAY  180 tablet  1  . glucosamine-chondroitin 500-400 MG tablet Take 1 tablet by mouth daily.        Marland Kitchen HYDROcodone-homatropine (HYCODAN) 5-1.5 MG/5ML syrup Take 5 mLs by mouth every 6 (six) hours as needed for  cough.  120 mL  1  . levalbuterol (XOPENEX) 1.25 MG/3ML nebulizer solution Take 1.25 mg by nebulization every 4 (four) hours as needed.  300 mL  0  . mometasone (NASONEX) 50 MCG/ACT nasal spray Place 1 spray into the nose 2 (two) times daily as needed.      . nystatin (MYCOSTATIN) powder Use as directed to affected area twice per day as needed  30 g  1  . omeprazole (PRILOSEC) 20 MG capsule Take 1 capsule (20 mg total) by mouth daily.  90 capsule  3  . oxymetazoline (AFRIN) 0.05 % nasal spray Place 2 sprays into the nose 2 (two) times daily as needed for congestion. For 5 days before nasonex      . potassium chloride SA (K-DUR,KLOR-CON) 20 MEQ tablet Take 1 tablet twice daily      . propafenone (RYTHMOL SR) 325 MG 12 hr capsule Take 1 capsule (325 mg total) by mouth 2 (two) times daily.  180 capsule  3  . sodium chloride (OCEAN) 0.65 % SOLN nasal spray 2 puffs every 4 hours as needed for nasal congestion      . traMADol (ULTRAM) 50 MG tablet Take 1 tablet (50 mg total) by mouth every 4 (four) hours as needed. For cough  45 tablet  1  . warfarin (COUMADIN) 7.5 MG tablet Take 1 tablet (7.5 mg total) by mouth as directed. Take as directed by coumadin clinic  120 tablet  1  . XOPENEX HFA 45 MCG/ACT inhaler INHALE 2 PUFFS EVERY 4 HOURS AS NEEDED  3 Inhaler  0  . nitroGLYCERIN (NITROSTAT) 0.4 MG SL tablet Place 1 tablet (0.4 mg total) under the tongue every 5 (five) minutes as needed for chest pain.  25 tablet  3   No current facility-administered medications on file prior to visit.   Review of Systems  Constitutional: Negative for unexpected weight change, or unusual diaphoresis  HENT: Negative for tinnitus.   Eyes: Negative for photophobia and visual disturbance.  Respiratory: Negative for choking and stridor.   Gastrointestinal: Negative for vomiting and blood in stool.  Genitourinary: Negative for hematuria and decreased urine volume.  Musculoskeletal: Negative for acute joint swelling Skin:  Negative for color change and wound.  Neurological: Negative for worsening tremors and numbness other than noted  Psychiatric/Behavioral: Negative for decreased concentration or  hyperactivity.       Objective:   Physical Exam BP 142/72  Pulse 88  Temp(Src) 98.2 F (36.8 C) (Oral)  Ht 5\' 4"  (1.626 m)  Wt 279 lb 6 oz (126.724 kg)  BMI 47.93 kg/m2  SpO2 91% VS noted, not ill appearing but markedly depressed affect Constitutional:  Pt appears well-developed and well-nourished.  HENT: Head: NCAT.  Right Ear: External ear normal.  Left Ear: External ear normal.  Eyes: Conjunctivae and EOM are normal. Pupils are equal, round, and reactive to light.  Neck: Normal range of motion. Neck supple.  Cardiovascular: Normal rate and regular rhythm.   Pulmonary/Chest: Effort normal and breath sounds normal.  Abd:  Soft, NT, non-distended, + BS Neurological: Pt is alert. Not confused , motor/sens intact to UE';s Skin: Skin is warm. No erythema. But has at least 20 cuts/scratches to left anterior forearm, mostly across the arm and distally, all healing well without signficant other red/tender/swelling Psychiatric: Pt behavior is normal. Thought content normal. but marked depressive affect    Assessment & Plan:

## 2012-03-31 ENCOUNTER — Ambulatory Visit (INDEPENDENT_AMBULATORY_CARE_PROVIDER_SITE_OTHER): Payer: BC Managed Care – PPO | Admitting: General Practice

## 2012-03-31 DIAGNOSIS — Z86718 Personal history of other venous thrombosis and embolism: Secondary | ICD-10-CM | POA: Diagnosis not present

## 2012-03-31 DIAGNOSIS — Z8672 Personal history of thrombophlebitis: Secondary | ICD-10-CM

## 2012-03-31 DIAGNOSIS — I4891 Unspecified atrial fibrillation: Secondary | ICD-10-CM

## 2012-03-31 DIAGNOSIS — I4892 Unspecified atrial flutter: Secondary | ICD-10-CM

## 2012-03-31 DIAGNOSIS — Z7901 Long term (current) use of anticoagulants: Secondary | ICD-10-CM

## 2012-03-31 LAB — POCT INR: INR: 2.1

## 2012-04-13 ENCOUNTER — Telehealth: Payer: Self-pay | Admitting: *Deleted

## 2012-04-13 ENCOUNTER — Ambulatory Visit (INDEPENDENT_AMBULATORY_CARE_PROVIDER_SITE_OTHER): Payer: BC Managed Care – PPO | Admitting: Psychology

## 2012-04-13 DIAGNOSIS — F331 Major depressive disorder, recurrent, moderate: Secondary | ICD-10-CM

## 2012-04-13 MED ORDER — WARFARIN SODIUM 7.5 MG PO TABS: 7.5000 mg | ORAL_TABLET | ORAL | Status: DC

## 2012-04-13 NOTE — Telephone Encounter (Signed)
Left msg on triage needing refill on her warfarin. Called pt back no answer LMOM rx [facility].../lmb

## 2012-04-17 ENCOUNTER — Other Ambulatory Visit (INDEPENDENT_AMBULATORY_CARE_PROVIDER_SITE_OTHER): Payer: BC Managed Care – PPO

## 2012-04-17 ENCOUNTER — Ambulatory Visit (INDEPENDENT_AMBULATORY_CARE_PROVIDER_SITE_OTHER): Payer: BC Managed Care – PPO | Admitting: Internal Medicine

## 2012-04-17 ENCOUNTER — Other Ambulatory Visit: Payer: Self-pay | Admitting: General Practice

## 2012-04-17 ENCOUNTER — Encounter: Payer: Self-pay | Admitting: Internal Medicine

## 2012-04-17 VITALS — BP 112/70 | HR 80 | Temp 97.0°F | Ht 64.0 in | Wt 282.2 lb

## 2012-04-17 DIAGNOSIS — R7309 Other abnormal glucose: Secondary | ICD-10-CM | POA: Diagnosis not present

## 2012-04-17 DIAGNOSIS — F329 Major depressive disorder, single episode, unspecified: Secondary | ICD-10-CM | POA: Diagnosis not present

## 2012-04-17 DIAGNOSIS — R7302 Impaired glucose tolerance (oral): Secondary | ICD-10-CM

## 2012-04-17 DIAGNOSIS — Z0000 Encounter for general adult medical examination without abnormal findings: Secondary | ICD-10-CM

## 2012-04-17 LAB — LIPID PANEL
Cholesterol: 141 mg/dL (ref 0–200)
HDL: 39.5 mg/dL (ref >39.00)
LDL Cholesterol: 83 mg/dL (ref 0–99)
Total CHOL/HDL Ratio: 4
Triglycerides: 93 mg/dL (ref 0.0–149.0)
VLDL: 18.6 mg/dL (ref 0.0–40.0)

## 2012-04-17 LAB — CBC WITH DIFFERENTIAL/PLATELET
Basophils Absolute: 0 10*3/uL (ref 0.0–0.1)
Basophils Relative: 0.4 % (ref 0.0–3.0)
Eosinophils Absolute: 0.1 10*3/uL (ref 0.0–0.7)
Eosinophils Relative: 1.9 % (ref 0.0–5.0)
HCT: 32.3 % — ABNORMAL LOW (ref 36.0–46.0)
Hemoglobin: 11.1 g/dL — ABNORMAL LOW (ref 12.0–15.0)
Lymphocytes Relative: 30.7 % (ref 12.0–46.0)
Lymphs Abs: 1.9 10*3/uL (ref 0.7–4.0)
MCHC: 34.4 g/dL (ref 30.0–36.0)
MCV: 83 fl (ref 78.0–100.0)
Monocytes Absolute: 0.5 10*3/uL (ref 0.1–1.0)
Monocytes Relative: 8.3 % (ref 3.0–12.0)
Neutro Abs: 3.7 10*3/uL (ref 1.4–7.7)
Neutrophils Relative %: 58.7 % (ref 43.0–77.0)
Platelets: 227 10*3/uL (ref 150.0–400.0)
RBC: 3.89 Mil/uL (ref 3.87–5.11)
RDW: 17.7 % — ABNORMAL HIGH (ref 11.5–14.6)
WBC: 6.3 10*3/uL (ref 4.5–10.5)

## 2012-04-17 LAB — URINALYSIS, ROUTINE W REFLEX MICROSCOPIC
Bilirubin Urine: NEGATIVE
Hgb urine dipstick: NEGATIVE
Ketones, ur: NEGATIVE
Nitrite: NEGATIVE
Specific Gravity, Urine: 1.01 (ref 1.000–1.030)
Total Protein, Urine: NEGATIVE
Urine Glucose: NEGATIVE
Urobilinogen, UA: 0.2 (ref 0.0–1.0)
pH: 6.5 (ref 5.0–8.0)

## 2012-04-17 LAB — HEPATIC FUNCTION PANEL
ALT: 19 U/L (ref 0–35)
AST: 15 U/L (ref 0–37)
Albumin: 3.7 g/dL (ref 3.5–5.2)
Alkaline Phosphatase: 101 U/L (ref 39–117)
Bilirubin, Direct: 0.2 mg/dL (ref 0.0–0.3)
Total Bilirubin: 1.3 mg/dL — ABNORMAL HIGH (ref 0.3–1.2)
Total Protein: 7 g/dL (ref 6.0–8.3)

## 2012-04-17 LAB — BASIC METABOLIC PANEL
BUN: 14 mg/dL (ref 6–23)
CO2: 35 mEq/L — ABNORMAL HIGH (ref 19–32)
Calcium: 8.6 mg/dL (ref 8.4–10.5)
Chloride: 100 mEq/L (ref 96–112)
Creatinine, Ser: 0.8 mg/dL (ref 0.4–1.2)
GFR: 78.41 mL/min (ref >60.00)
Glucose, Bld: 96 mg/dL (ref 70–99)
Potassium: 3.8 mEq/L (ref 3.5–5.1)
Sodium: 141 mEq/L (ref 135–145)

## 2012-04-17 LAB — HEMOGLOBIN A1C: Hgb A1c MFr Bld: 4.7 % (ref 4.6–6.5)

## 2012-04-17 LAB — TSH: TSH: 3.83 u[IU]/mL (ref 0.35–5.50)

## 2012-04-17 MED ORDER — WARFARIN SODIUM 7.5 MG PO TABS: 7.5000 mg | ORAL_TABLET | Freq: Every day | ORAL | Status: DC

## 2012-04-17 MED ORDER — ARIPIPRAZOLE 5 MG PO TABS: ORAL_TABLET | ORAL | Status: DC

## 2012-04-17 MED ORDER — WARFARIN SODIUM 7.5 MG PO TABS: 7.5000 mg | ORAL_TABLET | ORAL | Status: DC

## 2012-04-17 MED ORDER — OMEPRAZOLE 20 MG PO CPDR: 20.0000 mg | DELAYED_RELEASE_CAPSULE | Freq: Every day | ORAL | Status: DC

## 2012-04-17 NOTE — Assessment & Plan Note (Signed)
Mproved, cont abilify, pt plans to call [facility] to establish if will take her insurance

## 2012-04-17 NOTE — Patient Instructions (Addendum)
Your coumadin refill should be handled per Bailey Mech Please continue all other medications as before, and refills have been done if requested, the [facility] Please remember to followup with your GYN for the yearly pap smear and/or mammogram Please continue your efforts at being more active, low cholesterol diet, and weight control. You are otherwise up to date with prevention measures today. Please keep your appointments with your specialists as you have planned You will be contacted by phone if any changes need to be made immediately regarding your blood tests.  Otherwise, you will receive a letter about your results with an explanation, but please check with MyChart first. Thank you for enrolling in MyChart. Please follow the instructions below to securely access your online medical record. MyChart allows you to send messages to your doctor, view your test results, renew your prescriptions, schedule appointments, and more. Please return in 6 months, or sooner if needed

## 2012-04-17 NOTE — Progress Notes (Signed)
Subjective:    Patient ID: [name] [name], female    DOB: 01-24-1950, 62 y.o.   MRN: [id number]  HPI  Here for wellness and f/u;  Overall doing ok;  Pt denies CP, worsening SOB, DOE, wheezing, orthopnea, PND, palpitations, dizziness or syncope, except for mild incresaed leg edema today.  Pt denies neurological change such as new headache, facial or extremity weakness.  Pt denies polydipsia, polyuria, or low sugar symptoms. Pt states overall good compliance with treatment and medications, good tolerability, and has been trying to follow lower cholesterol diet.  Pt denies worsening depressive symptoms, suicidal ideation or panic. No fever, night sweats, wt loss, loss of appetite, or other constitutional symptoms.  Pt states good ability with ADL's, has low to mod fall risk, home safety reviewed and adequate, no other significant changes in hearing, and otherwise not very active with exercise.  Has been doing better with counseling recently and the abilify (has gained overall about 10 lbs per pt) has been trying to get an appt with psychiatry but unable due to insurance not being accepted by 2 psychiatrists so far.  Has a neice being seen at [facility], so she will try there next.  No overt bleeding or bruising on the coumadin, except did have a small but signficant bilat nasal bleed this am, using the mometasone only occasionally, lots of sneezing this am.   Has optho appt tomorrow, has Double vision and seeing spots with the right eye, spoke to optho by phone 2 days ago, ok to f/u tomorrow.  Has 8 appt's this month with her specialists as well.  Past Medical History  Diagnosis Date  . Chronic rhinitis   . Acute and chronic respiratory failure   . Morbid obesity     target wt=179lb for BMI<30 Peak wt 282lb  . DVT (deep venous thrombosis)   . COPD (chronic obstructive pulmonary disease)     Wert. PFTs 07/25/06 FEV1 55% ratio 53, DLC0 51% HFA 50% 12/16/2009  . Depression   . Osteoporosis     last BMD 4/08  -2.4, intolerant of bisphosphonates  . Hypertension   . Arrhythmia     atrial fibrillation, anticoagulation therapy  . Anxiety   . Glucose intolerance (impaired glucose tolerance)     on steroids  . VITAMIN D DEFICIENCY 10/27/2007  . HYPERLIPIDEMIA 06/19/2007  . PREMATURE VENTRICULAR CONTRACTIONS 10/23/2008  . Atrial flutter 07/01/2008  . Rhabdomyolysis 07/29/2009  . OSTEOPOROSIS 07/29/2006  . FRACTURE, RIB, RIGHT 06/18/2009  . PULMONARY EMBOLISM, HX OF 06/19/2007  . Tremor 10/13/2011   Past Surgical History  Procedure Laterality Date  . Abdominal hysterectomy    . Tonsillectomy    . Skin graft to middle r finger  1975  . S/p left arm fracture with fall off chair      reports that she quit smoking about 16 years ago. Her smoking use included Cigarettes. She has a 35 pack-year smoking history. She has never used smokeless tobacco. She reports that she does not drink alcohol or use illicit drugs. family history includes Asthma in her brother and son; Heart disease in her mother; and Throat cancer in her brother. Allergies  Allergen Reactions  . Duloxetine     REACTION: rhabdomyolysis  . Penicillins    Current Outpatient Prescriptions on File Prior to Visit  Medication Sig Dispense Refill  . acetaminophen (TYLENOL ARTHRITIS PAIN) 650 MG CR tablet Take as directed when needed for pain      . Aclidinium Bromide (TUDORZA PRESSAIR)  400 MCG/ACT AEPB Inhale 1 puff into the lungs 2 (two) times daily.  3 each  3  . amLODipine-valsartan (EXFORGE) 10-320 MG per tablet Take 1 tablet by mouth daily.  90 tablet  3  . budesonide-formoterol (SYMBICORT) 160-4.5 MCG/ACT inhaler Inhale 2 puffs into the lungs 2 (two) times daily.  3 Inhaler  3  . Calcium Carbonate-Vitamin D 600-400 MG-UNIT per tablet Take 1 tablet by mouth 2 (two) times daily.        . Cetirizine HCl (ZYRTEC ALLERGY) 10 MG CAPS Take 1 capsule (10 mg total) by mouth at bedtime.      . cholecalciferol (VITAMIN D) 1000 UNITS tablet Take 1,000  Units by mouth daily.        Marland Kitchen dextromethorphan-guaiFENesin (MUCINEX DM) 30-600 MG per 12 hr tablet 1 every 12 hours as needed w/flutter for cough/congestion      . FLUoxetine (PROZAC) 40 MG capsule Take 1 capsule (40 mg total) by mouth daily.  90 capsule  3  . furosemide (LASIX) 80 MG tablet TAKE 1 TABLET TWICE A DAY  180 tablet  1  . glucosamine-chondroitin 500-400 MG tablet Take 1 tablet by mouth daily.        Marland Kitchen levalbuterol (XOPENEX) 1.25 MG/3ML nebulizer solution Take 1.25 mg by nebulization every 4 (four) hours as needed.  300 mL  0  . mometasone (NASONEX) 50 MCG/ACT nasal spray Place 1 spray into the nose 2 (two) times daily as needed.      . nystatin (MYCOSTATIN) powder Use as directed to affected area twice per day as needed  30 g  1  . oxymetazoline (AFRIN) 0.05 % nasal spray Place 2 sprays into the nose 2 (two) times daily as needed for congestion. For 5 days before nasonex      . potassium chloride SA (K-DUR,KLOR-CON) 20 MEQ tablet Take 1 tablet twice daily      . propafenone (RYTHMOL SR) 325 MG 12 hr capsule Take 1 capsule (325 mg total) by mouth 2 (two) times daily.  180 capsule  3  . sodium chloride (OCEAN) 0.65 % SOLN nasal spray 2 puffs every 4 hours as needed for nasal congestion      . traMADol (ULTRAM) 50 MG tablet Take 1 tablet (50 mg total) by mouth every 4 (four) hours as needed. For cough  45 tablet  1  . XOPENEX HFA 45 MCG/ACT inhaler INHALE 2 PUFFS EVERY 4 HOURS AS NEEDED  3 Inhaler  0  . atorvastatin (LIPITOR) 10 MG tablet Take 1 tablet (10 mg total) by mouth at bedtime.  90 tablet  3  . nitroGLYCERIN (NITROSTAT) 0.4 MG SL tablet Place 1 tablet (0.4 mg total) under the tongue every 5 (five) minutes as needed for chest pain.  25 tablet  3   No current facility-administered medications on file prior to visit.    Review of Systems Constitutional: Negative for diaphoresis, activity change, appetite change or unexpected weight change.  HENT: Negative for hearing loss, ear  pain, facial swelling, mouth sores and neck stiffness.   Eyes: Negative for pain, redness and visual disturbance.  Respiratory: Negative for shortness of breath and wheezing.   Cardiovascular: Negative for chest pain and palpitations.  Gastrointestinal: Negative for diarrhea, blood in stool, abdominal distention or other pain Genitourinary: Negative for hematuria, flank pain or change in urine volume.  Musculoskeletal: Negative for myalgias and joint swelling.  Skin: Negative for color change and wound.  Neurological: Negative for syncope and numbness. other than  noted Hematological: Negative for adenopathy.  Psychiatric/Behavioral: Negative for hallucinations, self-injury, decreased concentration and agitation.      Objective:   Physical Exam BP 112/70  Pulse 80  Temp(Src) 97 F (36.1 C) (Oral)  Ht 5\' 4"  (1.626 m)  Wt 282 lb 4 oz (128.028 kg)  BMI 48.42 kg/m2  SpO2 97% VS noted,  Constitutional: Pt is oriented to person, place, and time. Appears well-developed and well-nourished.  Head: Normocephalic and atraumatic.  Right Ear: External ear normal.  Left Ear: External ear normal.  Nose: Nose normal.  Mouth/Throat: Oropharynx is clear and moist.  Eyes: Conjunctivae and EOM are normal. Pupils are equal, round, and reactive to light.  Neck: Normal range of motion. Neck supple. No JVD present. No tracheal deviation present.  Cardiovascular: Normal rate, regular rhythm, normal heart sounds and intact distal pulses.   Pulmonary/Chest: Effort normal and breath sounds without rales or wheezing.  Abdominal: Soft. Bowel sounds are normal. There is no tenderness. No HSM  Musculoskeletal: Normal range of motion. Exhibits no edema.  Lymphadenopathy:  Has no cervical adenopathy.  Neurological: Pt is alert and oriented to person, place, and time. Pt has normal reflexes. No cranial nerve deficit.  Skin: Skin is warm and dry. No rash noted.  Psychiatric:  Has  normal mood and affect. Behavior  is normal.     Assessment & Plan:

## 2012-04-17 NOTE — Assessment & Plan Note (Signed)

## 2012-04-18 ENCOUNTER — Encounter: Payer: Self-pay | Admitting: Internal Medicine

## 2012-04-18 ENCOUNTER — Telehealth: Payer: Self-pay | Admitting: Internal Medicine

## 2012-04-18 ENCOUNTER — Other Ambulatory Visit: Payer: Self-pay | Admitting: *Deleted

## 2012-04-18 MED ORDER — FUROSEMIDE 80 MG PO TABS: 80.0000 mg | ORAL_TABLET | Freq: Two times a day (BID) | ORAL | Status: DC

## 2012-04-18 NOTE — Telephone Encounter (Signed)
Spoke to pt made her pft and wert appt 4/9/cb

## 2012-04-18 NOTE — Telephone Encounter (Signed)
Patient scheduled to come in tomorrow,04/19/12 for MW at 1130am. At patients last (02/18/12) appt Dr. Sherene Sires did a "late add" on instructions in which wanted patient to be seen in 3 month w PFTs Patient is scheduled in less than 3 months AND does not have breathing test.  Per Verlon Au- if patient is feeling well, needs to be rescheduled for a 3 month W PFT If patient is not feeling well and needs to be seen, keep scheduled appt for tomorrow (w/out PFT).  OV from 02/18/12 Patient Instructions    Increase symbicort to 160 Take 2 puffs first thing in am and then another 2 puffs about 12 hours later until better then back to one twice daily  02 is 2lpm 24/7 x outside the house walk on 3lpm pulsed  Work on inhaler technique: relax and gently blow all the way out then take a nice smooth deep breath back in, triggering the inhaler at same time you start breathing in. Hold for up to 5 seconds if you can. Rinse and gargle with water when done  If your mouth or throat starts to bother you, I suggest you time the inhaler to your dental care and after using the inhaler(s) brush teeth and tongue with a baking soda containing toothpaste and when you rinse this out, gargle with it first to see if this helps your mouth and throat.  See calendar for specific medication instructions and bring it back for each and every office visit for every healthcare provider you see. Without it, you may not receive the best quality medical care that we feel you deserve.  You will note that the calendar groups together your maintenance medications that are timed at particular times of the day. Think of this as your checklist for what your doctor has instructed you to do until your next evaluation to see what benefit there is to staying on a consistent group of medications intended to keep you well. The other group at the bottom is entirely up to you to use as you see fit for specific symptoms that may arise between visits that require you to  treat them on an as needed basis. Think of this as your action plan or "what if" list.  Separating the top medications from the bottom group is fundamental to providing you adequate care going forward.  Late add : Ov 3 months with pfts

## 2012-04-19 ENCOUNTER — Ambulatory Visit: Payer: BC Managed Care – PPO | Admitting: Internal Medicine

## 2012-04-19 ENCOUNTER — Encounter: Payer: Self-pay | Admitting: Internal Medicine

## 2012-04-19 ENCOUNTER — Ambulatory Visit (INDEPENDENT_AMBULATORY_CARE_PROVIDER_SITE_OTHER): Payer: BC Managed Care – PPO | Admitting: Internal Medicine

## 2012-04-19 ENCOUNTER — Ambulatory Visit (INDEPENDENT_AMBULATORY_CARE_PROVIDER_SITE_OTHER)
Admission: RE | Admit: 2012-04-19 | Discharge: 2012-04-19 | Disposition: A | Discharge status: Home / Self Care | Payer: BC Managed Care – PPO | Source: Ambulatory Visit | Attending: Internal Medicine | Admitting: Internal Medicine

## 2012-04-19 VITALS — BP 124/62 | HR 101 | Temp 96.8°F | Ht 63.0 in | Wt 281.0 lb

## 2012-04-19 DIAGNOSIS — J9610 Chronic respiratory failure, unspecified whether with hypoxia or hypercapnia: Secondary | ICD-10-CM

## 2012-04-19 DIAGNOSIS — R079 Chest pain, unspecified: Secondary | ICD-10-CM | POA: Diagnosis not present

## 2012-04-19 DIAGNOSIS — J449 Chronic obstructive pulmonary disease, unspecified: Secondary | ICD-10-CM

## 2012-04-19 LAB — PULMONARY FUNCTION TEST

## 2012-04-19 NOTE — Progress Notes (Signed)
PFT done today. 

## 2012-04-19 NOTE — Assessment & Plan Note (Signed)
Reported 04/19/2012 x 3 week duration c/w mscp  As comes and goes too rapidly for ischemia, located post L shoulder with no worse with activity or breathing, relieved by tylenol

## 2012-04-19 NOTE — Patient Instructions (Addendum)
Please remember to go to the  x-ray department downstairs for your tests - we will call you with the results when they are available.  Always wear the 02 @ 3 lpm when walking outside to help burn off the fat   If you are satisfied with your treatment plan let your doctor know and he/she can either refill your medications or you can return here when your prescription runs out.     If in any way you are not 100% satisfied,  please tell us.  If 100% better, tell your friends!

## 2012-04-19 NOTE — Assessment & Plan Note (Signed)
-   09/29/10  Walked 3lpm  2 laps @ 185 ft each stopped due to  Sob with sats 90%     - HC03 11/08/11 = 34     - 11-17-11--o2 sat on ra at rest 83%ra     - 02/18/2012  Sat 88% RA across the room > Walked 3lpm x 3 laps @ 185 ft each stopped due to  End of study no desat     - 04/19/2012  Walked 3lpm x 3 laps @ 185 ft each stopped due to  End of study, sats dropped to 88 at very end   rx is 2lpm at rest/ sleep/ 3lpm with ex  Adequate control on present rx, reviewed

## 2012-04-19 NOTE — Progress Notes (Signed)
Subjective:    Patient ID: [name] [name], female    DOB: [birth year].   MRN: [id number]  HPI  67 yowf last smoked 2003 with COPD with minimum asthmatic component with an FEV1 of 55%, a ratio of 53%, and a diffusing capacity 51% documented 07/25/06. On 02 24 hours per day at 2lpm  6/09 weighed 271 and it was felt she was as debilitated as much by obesity as by airflow obstruction.  baseline = when well = walk to mother in law's house but stops at least once because it's uphill and frequently requiring rescue rx.    March 19, 2010-- Post hospital visit. Admitted 2/28-03/16/10( dc wt at 269 which was about 10 lbs down) for Acute on chronic resp failure secondary to decompensated diastolic heart failure . She was tx with aggressive diuresis. Conintued on coumadin. INR yesterday was 2.4. 2 D echo showed EF 55% w/ gr 2 diastolic dysfunction . VQ scan with low probability for PE. Since discharge some better but still real weak. Cough and congestion are some better. Edema worse this am, took with lasix that helped. rec  no change in rx    11/17/2011 f/u ov/[name] [name] no calendar, main complaint = tremor x one year, indolent onset.  No change activity tolerance on 3lpm when walking. rec  Try symbicort 160 one bid to reduce tremor    01/17/12 followup and medication review Patient returns for a two-month followup and medication review. Reviewed all her medications and organized them into a medication calendar    It appears that she is taking her medications correctly Last visit. She was decreased to Symbicort 1 puff twice daily to help with possible tremor. This does seem to help somewhat with her tremor She feels that her breathing is not quite as good with increased shortness of breath on the decreased dose of Symbicort, however, she's had no flare of cough and wheezing rec  Begin Tudorza 1 puff Twice daily > better    02/18/2012 f/u ov/[name] [name] cc better until 2 days prior to OV  With congested cough, white  mucus, not using hfa saba or neb saba yet, not optimizing action plan on med calendar.   rec Increase symbicort to 160 Take 2 puffs first thing in am and then another 2 puffs about 12 hours later until better then back to one twice daily 02 is 2lpm 24/7 x outside the house walk on 3lpm pulsed  Work on inhaler technique   04/19/2012 f/u ov/[name] [name] cc f/u copd/ 02 dep no med calendar in hand Chief Complaint  Patient presents with  . Follow-up    Pt states breathing has been worse for the past 3 wks. She has sharp pain in her back, comes and goes and started approx 2 wks ago.     Pain is L superior/posterior scapula comes goes not even every day and lasts from few secs to few minutes spot resolves, ? Tylenol helps. Never supine, not pleurtic. No assoc cough. Not using 02 @ 3lpm with exertion as prev rec and only using symbicort one bid as no perceived need for this or rescue saba   No obvious daytime variabilty to sob or assoc purulent sputum  Or  chest tightness, subjective wheeze overt sinus or hb symptoms. No unusual exp hx   Sleeping ok without nocturnal  or early am exacerbation  of respiratory  c/o's or need for noct saba. Also denies any obvious fluctuation of symptoms with weather or environmental changes or other aggravating  or alleviating factors except as outlined above   ROS  The following are not active complaints unless bolded sore throat, dysphagia, dental problems, itching, sneezing,  nasal congestion or excess/ purulent secretions, ear ache,   fever, chills, sweats, unintended wt loss, pleuritic or exertional cp, hemoptysis,  orthopnea pnd or leg swelling, presyncope, palpitations, heartburn, abdominal pain, anorexia, nausea, vomiting, diarrhea  or change in bowel or urinary habits, change in stools or urine, dysuria,hematuria,  rash, arthralgias, visual complaints, headache, numbness weakness or ataxia or problems with walking or coordination,  change in mood/affect or memory.       Past Medical History:  CHRONIC RHINITIS (ICD-472.0)  EDEMA (ICD-782.3)  ACUTE AND CHRONIC RESPIRATORY FAILURE (ICD-518.84)  MORBID OBESITY (ICD-278.01)  - Target wt = 179 for BMI < 30 peak wt 282  - Referred back to nutrition December 18, 2007  DEEP VENOUS THROMBOPHLEBITIS, HX OF (ICD-V12.52)  COPD (ICD-496)..........................................................Marland KitchenWert  - PFTs 07/25/06 FEV1 55% ratio 53, DLC0 51%  - HFA 50% December 16, 2009 > 75% March 27, 2010  DEPRESSION (ICD-311)  OSTEOPOROSIS (ICD-733.00) last BMD 4/08 -2.4, intolerant of bisphosphonates  HYPERTENSION (ICD-401.9)  Atrial fibrillation - paroxysmal  Anticoagulation therapy  Pulmonary embolism, hx of  neg stress myoview 10/07  Anxiety  glucose intolerance - on steroids  Hyperlipidemia  Complex medical regimen  - Med calendar done 05/01/2008, 08/26/2010 , 01/17/12          Objective:   Physical Exam  GEN: A/Ox3; pleasant , NAD, obese wf nad  Wt  274 09/01/10 >   09/29/2010  275 > 01/01/2011  280 > 04/29/2011  279 > 285 11/17/2011  > 288 02/18/2012 > 281 04/19/2012   HEENT:  Burns/AT,  EACs-clear, TMs-wnl, NOSE-clear drainage, THROAT-clear, no lesions, no postnasal drip or exudate noted.   NECK:  Supple w/ fair ROM; no JVD; normal carotid impulses w/o bruits; no thyromegaly or nodules palpated; no lymphadenopathy.  RESP  Diminshed BS in bases  w/o, wheezes/ rales/ or rhonchi.no accessory muscle use, no dullness to percussion  CARD:  RRR, no m/r/g  , tr peripheral edema, pulses intact, no cyanosis or clubbing.  GI:   Soft & nt; nml bowel sounds; no organomegaly or masses detected.  Musco: Warm bil, no deformities or joint swelling noted.   Neuro: alert, no focal deficits noted, very min bilat resting tremor    Skin: Warm, no lesions or rashes    CXR  04/19/2012 : No evidence of acute cardiopulmonary disease.         Assessment & Plan:

## 2012-04-21 ENCOUNTER — Telehealth: Payer: Self-pay | Admitting: Internal Medicine

## 2012-04-21 NOTE — Telephone Encounter (Signed)
I spoke with pt. She stated she received a bill from [facility] yesterday for $133. This is the 1st bill in many years she has received. She has BCBS and medicare. i called Melissa to see what is going on. She stated she will call there billing dept to have them give pt a call to get this straightned out for pt and see what is going on.  I called and made pt aware of this. She aprpreciated the help and needed nothing further

## 2012-04-22 ENCOUNTER — Encounter: Payer: Self-pay | Admitting: Internal Medicine

## 2012-04-24 ENCOUNTER — Ambulatory Visit (INDEPENDENT_AMBULATORY_CARE_PROVIDER_SITE_OTHER): Payer: BC Managed Care – PPO | Admitting: Internal Medicine

## 2012-04-24 ENCOUNTER — Encounter: Payer: Self-pay | Admitting: Internal Medicine

## 2012-04-24 ENCOUNTER — Telehealth: Payer: Self-pay | Admitting: Internal Medicine

## 2012-04-24 VITALS — BP 130/74 | HR 85 | Temp 98.3°F

## 2012-04-24 DIAGNOSIS — N390 Urinary tract infection, site not specified: Secondary | ICD-10-CM | POA: Diagnosis not present

## 2012-04-24 DIAGNOSIS — R350 Frequency of micturition: Secondary | ICD-10-CM | POA: Diagnosis not present

## 2012-04-24 LAB — POCT URINALYSIS DIPSTICK
Bilirubin, UA: NEGATIVE
Glucose, UA: NEGATIVE
Ketones, UA: NEGATIVE
Protein, UA: NEGATIVE
Spec Grav, UA: <=1.005
Urobilinogen, UA: 0.2
pH, UA: 5

## 2012-04-24 MED ORDER — CIPROFLOXACIN HCL 250 MG PO TABS: 250.0000 mg | ORAL_TABLET | Freq: Two times a day (BID) | ORAL | Status: DC

## 2012-04-24 NOTE — Telephone Encounter (Signed)
[name] [name]  - ok for OV today with me or with regina

## 2012-04-24 NOTE — Progress Notes (Signed)
HPI: complains of UTI symptoms Onset 4 days ago, progressively worse Not improved with OTC AZO associated with dysuria and small volume voiding with increased frequency denies hematuria, flank pain or fever The patient has a history of prior UTI  PMH: reviewed  ROS:  Gen.: No unexpected weight change, no night sweats Lungs: No cough or shortness of breath Cardiovascular: No palpitations or chest pain  PE: BP 130/74  Pulse 85  Temp(Src) 98.3 F (36.8 C) (Oral)  SpO2 97% General: No acute distress Lungs: Clear to auscultation Cardiovascular: Regular rate rhythm, no edema Abdomen: Mild to moderate discomfort of her suprapubic region, no flank tenderness to palpation  Lab Results  Component Value Date   WBC 6.3 04/17/2012   HGB 11.1* 04/17/2012   HCT 32.3* 04/17/2012   PLT 227.0 04/17/2012   GLUCOSE 96 04/17/2012   CHOL 141 04/17/2012   TRIG 93.0 04/17/2012   HDL 39.50 04/17/2012   LDLDIRECT 140.2 04/10/2010   LDLCALC 83 04/17/2012   ALT 19 04/17/2012   AST 15 04/17/2012   NA 141 04/17/2012   K 3.8 04/17/2012   CL 100 04/17/2012   CREATININE 0.8 04/17/2012   BUN 14 04/17/2012   CO2 35* 04/17/2012   TSH 3.83 04/17/2012   INR 2.1 03/31/2012   HGBA1C 4.7 04/17/2012    Assessment/Plan: UTI, classic symptoms with history of same  Empiric antibiotic x7 days Urine culture for identification and sensitivities Hydration recommended education provided

## 2012-04-24 NOTE — Telephone Encounter (Signed)
Call A Nurse scheduled her for today April 14 at 11:30 with Dr. Felicity Coyer.

## 2012-04-24 NOTE — Telephone Encounter (Signed)
Patient Information:  Caller Name: Jolena  Phone: [phone]  Patient: [name], [name]  Gender: Female  DOB: [birth year]  Age: 62 Years  PCP: Oliver Barre (Adults only)  Office Follow Up:  Does the office need to follow up with this patient?: No  Instructions For The Office: N/A  RN Note:  Dysuria with urgency and frequency. Noted blood on toilet tissue 04/21/12.  Using Azostandard so unable to tell if hematuria present.  Requires 35 minutes to get to office so scheduled for 1130 with Dr. Felicity Coyer.   Symptoms  Reason For Call & Symptoms: Dysuria.  Reviewed Health History In EMR: Yes  Reviewed Medications In EMR: Yes  Reviewed Allergies In EMR: Yes  Reviewed Surgeries / Procedures: Yes  Date of Onset of Symptoms: 04/21/2012  Treatments Tried: Azostandard TID  Treatments Tried Worked: Yes  Guideline(s) Used:  Urination Pain - Female  Disposition Per Guideline:   See Today in Office  Reason For Disposition Reached:   Age > 50 years  Advice Given:  Warm Saline SITZ Baths to Reduce Pain:  Sit in a warm saline bath for 20 minutes to cleanse the area and to reduce pain. Add 2 oz. of table salt or baking soda to a tub of water.  Call Back If:  You become worse.  Patient Will Follow Care Advice:  YES  Appointment Scheduled:  04/24/2012 11:30:00 Appointment Scheduled Provider:  Rene Paci (Adults only)

## 2012-04-24 NOTE — Patient Instructions (Signed)
It was good to see you today. Cipro antibiotics 2x/day x 1 week - Your prescription(s) have been submitted to your pharmacy. Please take as directed and contact our office if you believe you are having problem(s) with the medication(s). Ok to use AZO as you are doing, stay hydrated. Test(s) ordered today - urine culture. Your results will be released to MyChart (or called to you) after review, usually within 72hours after test completion. If any changes need to be made, you will be notified at that same time. Urinary Tract Infection A urinary tract infection (UTI) is often caused by a germ (bacteria). A UTI is usually helped with medicine (antibiotics) that kills germs. Take all the medicine until it is gone. Do this even if you are feeling better. You are usually better in 7 to 10 days. HOME CARE   Drink enough water and fluids to keep your pee (urine) clear or pale yellow. Drink:  Cranberry juice.  Water.  Avoid:  Caffeine.  Tea.  Bubbly (carbonated) drinks.  Alcohol.  Only take medicine as told by your doctor.  To prevent further infections:  Pee often.  After pooping (bowel movement), women should wipe from front to back. Use each tissue only once.  Pee before and after having sex (intercourse). Ask your doctor when your test results will be ready. Make sure you follow up and get your test results.  GET HELP RIGHT AWAY IF:   There is very bad back pain or lower belly (abdominal) pain.  You get the chills.  You have a fever.  Your baby is older than 3 months with a rectal temperature of 102 F (38.9 C) or higher.  Your baby is 48 months old or younger with a rectal temperature of 100.4 F (38 C) or higher.  You feel sick to your stomach (nauseous) or throw up (vomit).  There is continued burning with peeing.  Your problems are not better in 3 days. Return sooner if you are getting worse. MAKE SURE YOU:   Understand these instructions.  Will watch your  condition.  Will get help right away if you are not doing well or get worse. Document Released: 06/16/2007 Document Revised: 03/22/2011 Document Reviewed: 06/16/2007 [facility] Patient Information 2013 [place], [place].

## 2012-04-26 ENCOUNTER — Ambulatory Visit (INDEPENDENT_AMBULATORY_CARE_PROVIDER_SITE_OTHER): Payer: BC Managed Care – PPO | Admitting: General Practice

## 2012-04-26 ENCOUNTER — Ambulatory Visit: Payer: BC Managed Care – PPO | Admitting: Psychology

## 2012-04-26 DIAGNOSIS — Z7901 Long term (current) use of anticoagulants: Secondary | ICD-10-CM

## 2012-04-26 DIAGNOSIS — I4892 Unspecified atrial flutter: Secondary | ICD-10-CM

## 2012-04-26 DIAGNOSIS — I4891 Unspecified atrial fibrillation: Secondary | ICD-10-CM | POA: Diagnosis not present

## 2012-04-26 DIAGNOSIS — Z86718 Personal history of other venous thrombosis and embolism: Secondary | ICD-10-CM

## 2012-04-26 DIAGNOSIS — Z8672 Personal history of thrombophlebitis: Secondary | ICD-10-CM

## 2012-04-26 LAB — POCT INR: INR: 1.8

## 2012-04-27 ENCOUNTER — Encounter: Payer: Self-pay | Admitting: Internal Medicine

## 2012-04-30 ENCOUNTER — Other Ambulatory Visit: Payer: Self-pay | Admitting: Internal Medicine

## 2012-05-01 NOTE — Telephone Encounter (Signed)
Robin to let pt know, I normally dont prescribe this medication, so she should prob ask for refill from her cardiology

## 2012-05-01 NOTE — Telephone Encounter (Signed)
Patient informed. 

## 2012-05-02 ENCOUNTER — Encounter: Payer: Self-pay | Admitting: Neurology

## 2012-05-02 ENCOUNTER — Encounter: Payer: Self-pay | Admitting: Internal Medicine

## 2012-05-02 ENCOUNTER — Ambulatory Visit (INDEPENDENT_AMBULATORY_CARE_PROVIDER_SITE_OTHER): Payer: BC Managed Care – PPO | Admitting: Neurology

## 2012-05-02 VITALS — BP 118/72 | HR 96 | Temp 97.8°F | Resp 20 | Wt 287.0 lb

## 2012-05-02 DIAGNOSIS — R209 Unspecified disturbances of skin sensation: Secondary | ICD-10-CM

## 2012-05-02 DIAGNOSIS — G250 Essential tremor: Secondary | ICD-10-CM | POA: Diagnosis not present

## 2012-05-02 DIAGNOSIS — G251 Drug-induced tremor: Secondary | ICD-10-CM

## 2012-05-02 DIAGNOSIS — R202 Paresthesia of skin: Secondary | ICD-10-CM

## 2012-05-02 DIAGNOSIS — F329 Major depressive disorder, single episode, unspecified: Secondary | ICD-10-CM | POA: Diagnosis not present

## 2012-05-02 DIAGNOSIS — G252 Other specified forms of tremor: Secondary | ICD-10-CM

## 2012-05-02 MED ORDER — SULFAMETHOXAZOLE-TRIMETHOPRIM 800-160 MG PO TABS: 1.0000 | ORAL_TABLET | Freq: Two times a day (BID) | ORAL | Status: DC

## 2012-05-02 MED ORDER — PRIMIDONE 50 MG PO TABS: ORAL_TABLET | ORAL | Status: DC

## 2012-05-02 NOTE — Progress Notes (Signed)
Subjective:   [name] [name] was seen in f/u today regarding tremor.  I reviewed her notes from her other physicians prior to her appointment today.  The patient is a 62 y.o. right handed female with a history of tremor.Onset of symptoms was gradual, starting about 1 year ago. Tremor started in both hands equally but now it seems to involve in her legs as well.  She has no head tremor.  Fatigue makes it worse.  Caffeine does not affect the tremor.  She is on symbicort and uses prednisone intermittently, the last a month ago.  She does not recall the prednisone making her worse.  There is no family hx of tremor.  She does not feel like she has a tremor on the inside.  She does have postural instability swith some falls.  She had an MRI of the brain in April 2013 demonstrating small vessel disease.  Tremor interferes with typing.  She has to hold the left hand with the right when she types.  She wears a bib when eating spaghetti.  She has some trouble with soup but has no trouble with peas on a spoon.    She can drink liquid with one hand.  The patient also reports that she has had diplopia for the last few years.  She has not had any ptosis.  She has been to an eye doctor regarding the diplopia.  While she did not know the exact etiology, she does state that she was told that she had a "lazy eye."  She got new glasses with prisms, but that did not particularly help.  She does have chronic shortness of breath, but that is from COPD.  AchR Ab's on 10/31 were negative.    Last visit, I suggested to her that perhaps part of the tremor was due to Symbicort.  Her Symbicort was decreased and she was started on turdoza.  I also noted since last visit that she had a significant increase in depression and began to cut herself superficially.  Because of that, she was started on Abilify.  She was referred to psychiatry but 2 different ones have called her back and said that they don't accept her insurance.  She saw Dr.  Dellia Cloud but admits that the 2nd time she went to see him he was 15 min late and she walked out.  She is not suicidal currently, but admits to significant depression.  Her biggest c/o today is L hand paresthesias that have been going on for a few weeks, but have become severe.  It involves all of the fingers.  It seems to radiate up into the antecubital fossa.  It does not involve the neck or proximal arm.  She has been wearing a wrist splint for a few weeks.  It seems to help, but she cannot wear it all of the time.  Current/Previously tried tremor medications: none  Current medications that may exacerbate tremor:  symbicort   Allergies  Allergen Reactions  . Duloxetine     REACTION: rhabdomyolysis  . Penicillins     Current Outpatient Prescriptions on File Prior to Visit  Medication Sig Dispense Refill  . acetaminophen (TYLENOL ARTHRITIS PAIN) 650 MG CR tablet Take as directed when needed for pain      . Aclidinium Bromide (TUDORZA PRESSAIR) 400 MCG/ACT AEPB Inhale 1 puff into the lungs 2 (two) times daily.  3 each  3  . amLODipine-valsartan (EXFORGE) 10-320 MG per tablet Take 1 tablet by mouth daily.  90 tablet  3  . ARIPiprazole (ABILIFY) 5 MG tablet 1/2 tab by mouth per day  45 tablet  3  . atorvastatin (LIPITOR) 10 MG tablet Take 1 tablet (10 mg total) by mouth at bedtime.  90 tablet  3  . budesonide-formoterol (SYMBICORT) 160-4.5 MCG/ACT inhaler Inhale 2 puffs into the lungs 2 (two) times daily.  3 Inhaler  3  . Calcium Carbonate-Vitamin D 600-400 MG-UNIT per tablet Take 1 tablet by mouth 2 (two) times daily.        . Cetirizine HCl (ZYRTEC ALLERGY) 10 MG CAPS Take 1 capsule (10 mg total) by mouth at bedtime.      . cholecalciferol (VITAMIN D) 1000 UNITS tablet Take 1,000 Units by mouth daily.        . ciprofloxacin (CIPRO) 250 MG tablet Take 1 tablet (250 mg total) by mouth 2 (two) times daily.  14 tablet  0  . dextromethorphan-guaiFENesin (MUCINEX DM) 30-600 MG per 12 hr tablet  1 every 12 hours as needed w/flutter for cough/congestion      . FLUoxetine (PROZAC) 40 MG capsule TAKE 1 CAPSULE DAILY  90 capsule  2  . furosemide (LASIX) 80 MG tablet Take 1 tablet (80 mg total) by mouth 2 (two) times daily.  180 tablet  1  . glucosamine-chondroitin 500-400 MG tablet Take 1 tablet by mouth daily.        Marland Kitchen levalbuterol (XOPENEX) 1.25 MG/3ML nebulizer solution Take 1.25 mg by nebulization every 4 (four) hours as needed.  300 mL  0  . mometasone (NASONEX) 50 MCG/ACT nasal spray Place 1 spray into the nose 2 (two) times daily as needed.      . nitroGLYCERIN (NITROSTAT) 0.4 MG SL tablet Place 1 tablet (0.4 mg total) under the tongue every 5 (five) minutes as needed for chest pain.  25 tablet  3  . nystatin (MYCOSTATIN) powder Use as directed to affected area twice per day as needed  30 g  1  . omeprazole (PRILOSEC) 20 MG capsule Take 1 capsule (20 mg total) by mouth daily.  90 capsule  3  . oxymetazoline (AFRIN) 0.05 % nasal spray Place 2 sprays into the nose 2 (two) times daily as needed for congestion. For 5 days before nasonex      . potassium chloride SA (K-DUR,KLOR-CON) 20 MEQ tablet Take 1 tablet twice daily      . propafenone (RYTHMOL SR) 325 MG 12 hr capsule Take 1 capsule (325 mg total) by mouth 2 (two) times daily.  180 capsule  3  . sodium chloride (OCEAN) 0.65 % SOLN nasal spray 2 puffs every 4 hours as needed for nasal congestion      . traMADol (ULTRAM) 50 MG tablet Take 1 tablet (50 mg total) by mouth every 4 (four) hours as needed. For cough  45 tablet  1  . warfarin (COUMADIN) 7.5 MG tablet Take 1 tablet (7.5 mg total) by mouth daily. Take as directed by coumadin clinic  120 tablet  0  . XOPENEX HFA 45 MCG/ACT inhaler INHALE 2 PUFFS EVERY 4 HOURS AS NEEDED  3 Inhaler  0   No current facility-administered medications on file prior to visit.    Past Medical History  Diagnosis Date  . Chronic rhinitis   . Acute and chronic respiratory failure   . Morbid obesity      target wt=179lb for BMI<30 Peak wt 282lb  . DVT (deep venous thrombosis)   . COPD (chronic obstructive pulmonary disease)  [facility]. PFTs 07/25/06 FEV1 55% ratio 53, DLC0 51% HFA 50% 12/16/2009  . Depression   . Osteoporosis     last BMD 4/08 -2.4, intolerant of bisphosphonates  . Hypertension   . Arrhythmia     atrial fibrillation, anticoagulation therapy  . Anxiety   . Glucose intolerance (impaired glucose tolerance)     on steroids  . VITAMIN D DEFICIENCY   . HYPERLIPIDEMIA   . PREMATURE VENTRICULAR CONTRACTIONS   . Atrial flutter   . Rhabdomyolysis 07/29/2009  . FRACTURE, RIB, RIGHT 06/18/2009  . PULMONARY EMBOLISM, HX OF 06/19/2007  . Tremor     Past Surgical History  Procedure Laterality Date  . Abdominal hysterectomy    . Tonsillectomy    . Skin graft to middle r finger  1975  . S/p left arm fracture with fall off chair      History   Social History  . Marital Status: Married    Spouse Name: N/A    Number of Children: 3  . Years of Education: N/A   Occupational History  . 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 Smoking status: Former Smoker -- 1.00 packs/day for 35 years    Types: Cigarettes    Quit date: 01/12/1996  . Smokeless tobacco: Never Used  . Alcohol Use: No  . Drug Use: No  . Sexually Active: Not on file   Other Topics Concern  . Not on file   Social History Narrative  . No narrative on file    Family Status  Relation Status Death Age  . Mother Deceased     MI  . Father Deceased     liver failure  . Brother Deceased     2, CA (throat), AIDS  . Brother Alive     3, HLD, HTN  . Child Alive     3, macular degeneration    Review of Systems She has had some nausea and black stools just today.  She is on iron supplementation.  A complete 10 system ROS was obtained and was negative apart from what is mentioned.   Objective:   VITALS:   Filed Vitals:   05/02/12 1250  BP: 118/72  Pulse: 96  Temp:  97.8 F (36.6 C)  Resp: 20  Weight: 287 lb (130.182 kg)   Gen:  Appears stated age and in NAD. HEENT:  Normocephalic, atraumatic. The mucous membranes are moist. The superficial temporal arteries are without ropiness or tenderness. Cardiovascular: Regular rate and rhythm. Lungs: Clear to auscultation bilaterally. Neck: There are no carotid bruits noted bilaterally.  NEUROLOGICAL:  Orientation:  The patient is alert and oriented x 3.  Recent and remote memory are intact.  Attention span and concentration are normal.  Able to name objects and repeat without trouble.  Fund of knowledge is appropriate Cranial nerves: There is good facial symmetry. The pupils are equal round and reactive to light bilaterally. Fundoscopic exam reveals clear disc margins bilaterally. Extraocular muscles are intact and visual fields are full to confrontational testing. Speech is fluent and clear. Soft palate rises symmetrically and there is no tongue deviation. Hearing is intact to conversational tone. Tone: Tone is good throughout. Sensation: Sensation is intact to light touch and pinprick throughout (facial, trunk, extremities). Vibration is intact at the bilateral big toe. There is no extinction with double simultaneous stimulation. There is no sensory dermatomal level identified. Coordination:  The patient has no dysdiadichokinesia or dysmetria. Motor: Strength is 5/5  in the bilateral upper and lower extremities.  Shoulder shrug is equal bilaterally.  There is no pronator drift.  There are no fasciculations noted. DTR's: Deep tendon reflexes are 2/4 at the bilateral biceps, triceps, brachioradialis, patella and achilles.  Plantar responses are downgoing bilaterally. Gait and Station: The patient is able to ambulate without difficulty.  The patient is able to stand in the Romberg position.  She is able to arise out of the chair without use of her hands.  MOVEMENT EXAM: Tremor:  There is  tremor in the UE, noted  most significantly with intention.  The left is worse than the right..  The patient is  able to draw Archimedes spirals with minimal difficulty.  There is no tremor at rest.       Assessment:   1.  Tremor, likely exacerbated by symbicort.    -She has decreased the symbicort some, without an improvement in tremor.  She would like to try medication. 2.  Diplopia.  -Acetylcholine receptor antibodies were negative and I see no other neurologic cause for intermittent diplopia. 3.  left hand paresthesias.  -These sound consistent with a potential entrapment neuropathy. Plan:   1.  We talked about tremor.  She is not a candidate for beta blocker therapy because of COPD.  We talked about the risks, but ultimately decided to try primidone.  I asked her to talk to her cardiologist and has an interaction with the Coumadin, but also told her that we are going to use low dosages and I think that this will be acceptable.  I also talked to her about the potential interaction with Abilify.  I also talked to her about the fact that Abilify can cause tremor and parkinsonism.  I would really like to see this managed by psychiatry, and if she needs an atypical antipsychotic, I would prefer Seroquel.  She was agreeable to calling her insurance company in finding out which psychiatrists were in network. 2.  Risks, benefits, side effects and alternative therapies were discussed.  The opportunity to ask questions was given and they were answered to the best of my ability.  The patient expressed understanding and willingness to follow the outlined treatment protocols. 3.  I will send her for an EMG. 4.  I will plan on seeing her back in about 3 months to reevaluate her tremor, but told her to call me before then for the results of her EMG.

## 2012-05-02 NOTE — Patient Instructions (Addendum)
1.  Start mysoline (primidone) - 50 mg - 1/2 tablet at night for a week and then increase to one tablet nightly.  Let cardiology know that you are on this 2.  Call me a week after your EMG testing is complete and we will see if we can get you the results 3.  Call [employer] and see what psychiatrists are in your network.  Your appointment for the electromyelogram is scheduled for Monday, April 28th at 10:45am at [facility] [address] [place], [place] [phone].

## 2012-05-03 ENCOUNTER — Telehealth: Payer: Self-pay | Admitting: Cardiology

## 2012-05-03 NOTE — Telephone Encounter (Signed)
Left message for pt to call.

## 2012-05-03 NOTE — Telephone Encounter (Signed)
Spoke with pt, mysoline can increase the effects of her warfarin. The pts CVRR appt moved up. Pt voiced understanding.

## 2012-05-03 NOTE — Telephone Encounter (Signed)
F/u ° ° °Pt returning your call °

## 2012-05-03 NOTE — Telephone Encounter (Signed)
New problem     Went to see a Neurologist on yesterday .    Has some question regarding medication - Mysoline 50 mg 1/2 tab at night. Increase 1 nightly.

## 2012-05-04 ENCOUNTER — Telehealth: Payer: Self-pay

## 2012-05-04 NOTE — Telephone Encounter (Signed)
Find out if she only took 1 dose.  If so, I would recommend she retry one more time because sometimes the very first pill can make people feel bad (first dose effect) but doesn't happen after that.

## 2012-05-04 NOTE — Telephone Encounter (Signed)
She has only had one dose, but will try it again and let us know how it turns out.

## 2012-05-04 NOTE — Telephone Encounter (Signed)
Pt calling to let you know she is unable to take the primidone.  She said her heart is racing and she feels sick as a dog.

## 2012-05-09 ENCOUNTER — Ambulatory Visit (INDEPENDENT_AMBULATORY_CARE_PROVIDER_SITE_OTHER): Payer: BC Managed Care – PPO

## 2012-05-09 ENCOUNTER — Telehealth: Payer: Self-pay | Admitting: Neurology

## 2012-05-09 ENCOUNTER — Encounter: Payer: Self-pay | Admitting: Cardiology

## 2012-05-09 ENCOUNTER — Ambulatory Visit (INDEPENDENT_AMBULATORY_CARE_PROVIDER_SITE_OTHER): Payer: BC Managed Care – PPO | Admitting: Cardiology

## 2012-05-09 ENCOUNTER — Telehealth: Payer: Self-pay | Admitting: Internal Medicine

## 2012-05-09 VITALS — BP 138/72 | HR 68 | Ht 63.0 in | Wt 285.0 lb

## 2012-05-09 DIAGNOSIS — Z8672 Personal history of thrombophlebitis: Secondary | ICD-10-CM

## 2012-05-09 DIAGNOSIS — Z86718 Personal history of other venous thrombosis and embolism: Secondary | ICD-10-CM | POA: Diagnosis not present

## 2012-05-09 DIAGNOSIS — I4891 Unspecified atrial fibrillation: Secondary | ICD-10-CM

## 2012-05-09 DIAGNOSIS — I4892 Unspecified atrial flutter: Secondary | ICD-10-CM | POA: Diagnosis not present

## 2012-05-09 DIAGNOSIS — Z7901 Long term (current) use of anticoagulants: Secondary | ICD-10-CM

## 2012-05-09 LAB — POCT INR: INR: 2.9

## 2012-05-09 MED ORDER — VALSARTAN 320 MG PO TABS: 320.0000 mg | ORAL_TABLET | Freq: Every day | ORAL | Status: DC

## 2012-05-09 MED ORDER — DILTIAZEM HCL ER COATED BEADS 180 MG PO CP24: 180.0000 mg | ORAL_CAPSULE | Freq: Every day | ORAL | Status: DC

## 2012-05-09 MED ORDER — FUROSEMIDE 80 MG PO TABS: 80.0000 mg | ORAL_TABLET | Freq: Two times a day (BID) | ORAL | Status: DC

## 2012-05-09 NOTE — Patient Instructions (Addendum)
Your physician wants you to follow-up in: 6 MONTHS WITH DR Jens Som You will receive a reminder letter in the mail two months in advance. If you don't receive a letter, please call our office to schedule the follow-up appointment.   STOP EXFORGE  START VALSARTAN 320 MG ONCE DAILY  START DILTIAZEM 180 MG ONCE DAILY

## 2012-05-09 NOTE — Assessment & Plan Note (Signed)
Given history of atrial fibrillation I will discontinue Norvasc and add Cardizem.

## 2012-05-09 NOTE — Assessment & Plan Note (Signed)
Most likely from right-sided heart failure related to obesity hypoventilation syndrome, obstructive sleep apnea and COPD. Continue Lasix.

## 2012-05-09 NOTE — Assessment & Plan Note (Signed)
Continue statin. 

## 2012-05-09 NOTE — Telephone Encounter (Signed)
Please let pt know that the EMG of her arms was normal and make sure Dr. Jonny Ruiz has a copy.  Does not appear to be a neurologic problem (no evidence of carpal tunnel or pinched nerve from neck or elbow).  Perhaps it is an orthopedic problem but that is unfortunately out of my area of expertise.

## 2012-05-09 NOTE — Telephone Encounter (Signed)
Spoke with the pt She was seen by Dr. Jens Som today and med changes were made  She states that she is to stop taking exforge once this runs out (has about 1 wk left) Then she will start diltiazem 180 1 qd and diovan 320 mg 1 qd to take place of the exforge She is asking if MW wants her to come in and see TP for med cal She is concerned her list wont be correct when she comes back Last AVS per MW says to f/u PRN, but pt still wants to come for med cal if MW wants her to  Please advise thanks!

## 2012-05-09 NOTE — Assessment & Plan Note (Signed)
Patient remains in sinus rhythm. Continue Coumadin. Discontinue Norvasc and add Cardizem 180 mg daily. Continue propafenone.

## 2012-05-09 NOTE — Telephone Encounter (Signed)
Called and spoke with the patient. Information given as per Dr. Arbutus Leas below. The patient had no additional questions or concerns. Will be sure Dr. Jonny Ruiz has a copy of the test results.

## 2012-05-09 NOTE — Assessment & Plan Note (Signed)
Patient counseled on weight loss. 

## 2012-05-09 NOTE — Progress Notes (Signed)
HPI: Pleasant female for fu of atrial fibrillation, history of PE on chronic Coumadin, diastolic dysfunction, morbid obesity and COPD. Cardiac catheterization in 2003 showed no significant coronary artery disease. Echocardiogram in November of 2013 revealed normal LV function and moderate left ventricular hypertrophy. There was mild left atrial enlargement. Since she was last seen in Oct 2013. Since then, she has dyspnea on exertion but no orthopnea or PND. She does have pedal edema. No chest pain or syncope.  Current Outpatient Prescriptions  Medication Sig Dispense Refill  . acetaminophen (TYLENOL ARTHRITIS PAIN) 650 MG CR tablet Take as directed when needed for pain      . Aclidinium Bromide (TUDORZA PRESSAIR) 400 MCG/ACT AEPB Inhale 1 puff into the lungs 2 (two) times daily.  3 each  3  . amLODipine-valsartan (EXFORGE) 10-320 MG per tablet Take 1 tablet by mouth daily.  90 tablet  3  . ARIPiprazole (ABILIFY) 5 MG tablet 1/2 tab by mouth per day  45 tablet  3  . atorvastatin (LIPITOR) 10 MG tablet Take 1 tablet (10 mg total) by mouth at bedtime.  90 tablet  3  . budesonide-formoterol (SYMBICORT) 160-4.5 MCG/ACT inhaler Inhale 2 puffs into the lungs 2 (two) times daily.  3 Inhaler  3  . Calcium Carbonate-Vitamin D 600-400 MG-UNIT per tablet Take 1 tablet by mouth 2 (two) times daily.        . Cetirizine HCl (ZYRTEC ALLERGY) 10 MG CAPS Take 1 capsule (10 mg total) by mouth at bedtime.      . cholecalciferol (VITAMIN D) 1000 UNITS tablet Take 1,000 Units by mouth daily.        . ciprofloxacin (CIPRO) 250 MG tablet Take 1 tablet (250 mg total) by mouth 2 (two) times daily.  14 tablet  0  . dextromethorphan-guaiFENesin (MUCINEX DM) 30-600 MG per 12 hr tablet 1 every 12 hours as needed w/flutter for cough/congestion      . FLUoxetine (PROZAC) 40 MG capsule TAKE 1 CAPSULE DAILY  90 capsule  2  . furosemide (LASIX) 80 MG tablet Take 1 tablet (80 mg total) by mouth 2 (two) times daily.  180 tablet  1    . glucosamine-chondroitin 500-400 MG tablet Take 1 tablet by mouth daily.        . nitroGLYCERIN (NITROSTAT) 0.4 MG SL tablet Place 1 tablet (0.4 mg total) under the tongue every 5 (five) minutes as needed for chest pain.  25 tablet  3  . nystatin (MYCOSTATIN) powder Use as directed to affected area twice per day as needed  30 g  1  . omeprazole (PRILOSEC) 20 MG capsule Take 1 capsule (20 mg total) by mouth daily.  90 capsule  3  . oxymetazoline (AFRIN) 0.05 % nasal spray Place 2 sprays into the nose 2 (two) times daily as needed for congestion. For 5 days before nasonex      . potassium chloride SA (K-DUR,KLOR-CON) 20 MEQ tablet Take 1 tablet twice daily      . primidone (MYSOLINE) 50 MG tablet 1/2 tablet at night for a week, then 1 tablet at night thereafter  30 tablet  3  . propafenone (RYTHMOL SR) 325 MG 12 hr capsule Take 1 capsule (325 mg total) by mouth 2 (two) times daily.  180 capsule  3  . sodium chloride (OCEAN) 0.65 % SOLN nasal spray 2 puffs every 4 hours as needed for nasal congestion      . sulfamethoxazole-trimethoprim (SEPTRA DS) 800-160 MG per tablet Take 1  tablet by mouth 2 (two) times daily.  14 tablet  0  . traMADol (ULTRAM) 50 MG tablet Take 1 tablet (50 mg total) by mouth every 4 (four) hours as needed. For cough  45 tablet  1  . warfarin (COUMADIN) 7.5 MG tablet Take 1 tablet (7.5 mg total) by mouth daily. Take as directed by coumadin clinic  120 tablet  0  . XOPENEX HFA 45 MCG/ACT inhaler INHALE 2 PUFFS EVERY 4 HOURS AS NEEDED  3 Inhaler  0  . levalbuterol (XOPENEX) 1.25 MG/3ML nebulizer solution Take 1.25 mg by nebulization every 4 (four) hours as needed.  300 mL  0  . mometasone (NASONEX) 50 MCG/ACT nasal spray Place 1 spray into the nose 2 (two) times daily as needed.       No current facility-administered medications for this visit.     Past Medical History  Diagnosis Date  . Chronic rhinitis   . Acute and chronic respiratory failure   . Morbid obesity      target wt=179lb for BMI<30 Peak wt 282lb  . DVT (deep venous thrombosis)   . COPD (chronic obstructive pulmonary disease)     [facility]. PFTs 07/25/06 FEV1 55% ratio 53, DLC0 51% HFA 50% 12/16/2009  . Depression   . Osteoporosis     last BMD 4/08 -2.4, intolerant of bisphosphonates  . Hypertension   . Arrhythmia     atrial fibrillation, anticoagulation therapy  . Anxiety   . Glucose intolerance (impaired glucose tolerance)     on steroids  . VITAMIN D DEFICIENCY   . HYPERLIPIDEMIA   . PREMATURE VENTRICULAR CONTRACTIONS   . Atrial flutter   . Rhabdomyolysis 07/29/2009  . FRACTURE, RIB, RIGHT 06/18/2009  . PULMONARY EMBOLISM, HX OF 06/19/2007  . Tremor     Past Surgical History  Procedure Laterality Date  . Abdominal hysterectomy    . Tonsillectomy    . Skin graft to middle r finger  1975  . S/p left arm fracture with fall off chair      History   Social History  . Marital Status: Married    Spouse Name: N/A    Number of Children: 3  . Years of Education: N/A   Occupational History  . 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 Smoking status: Former Smoker -- 1.00 packs/day for 35 years    Types: Cigarettes    Quit date: 01/12/1996  . Smokeless tobacco: Never Used  . Alcohol Use: No  . Drug Use: No  . Sexually Active: Not on file   Other Topics Concern  . Not on file   Social History Narrative  . No narrative on file    ROS: Problems with depression and LUE numbness but no fevers or chills, productive cough, hemoptysis, dysphasia, odynophagia, melena, hematochezia, dysuria, hematuria, rash, seizure activity, orthopnea, PND, claudication. Remaining systems are negative.  Physical Exam: Well-developed morbidly obese in no acute distress.  Skin is warm and dry.  HEENT is normal.  Neck is supple.  Chest is clear to auscultation with normal expansion.  Cardiovascular exam is regular rate and rhythm.  Abdominal exam nontender or distended.  No masses palpated. Extremities show 1+ edema. neuro grossly intact  ECG sinus rhythm at a rate of 68. Nonspecific ST changes.

## 2012-05-10 NOTE — Telephone Encounter (Signed)
Spoke with pt and notified of recs per MW She verbalized understanding and denied any questions Nothing further needed

## 2012-05-10 NOTE — Telephone Encounter (Signed)
These are minor substitutes for the same med (exforge so she should cross out the line for exforge and enter diovan/cardizem beside exforege and 320/180 in the strength box but she's always welcome to come  Back to see [name] and redo her calendar anytime

## 2012-05-10 NOTE — Telephone Encounter (Signed)
LMTCB

## 2012-05-11 ENCOUNTER — Ambulatory Visit: Payer: BC Managed Care – PPO | Admitting: Psychology

## 2012-05-12 ENCOUNTER — Encounter: Payer: Self-pay | Admitting: Internal Medicine

## 2012-05-12 ENCOUNTER — Ambulatory Visit (INDEPENDENT_AMBULATORY_CARE_PROVIDER_SITE_OTHER): Payer: BC Managed Care – PPO | Admitting: Psychology

## 2012-05-12 ENCOUNTER — Telehealth: Payer: Self-pay | Admitting: Internal Medicine

## 2012-05-12 DIAGNOSIS — F331 Major depressive disorder, recurrent, moderate: Secondary | ICD-10-CM

## 2012-05-12 NOTE — Telephone Encounter (Signed)
Pt calling and states that she needs a refill on Abilify; instructed pt that it looks like Abilify was filled 04/17/12 through [facility]; pt looked and found the medication at her home; denies need for refill now and denies any sx

## 2012-05-17 ENCOUNTER — Encounter: Payer: Self-pay | Admitting: Internal Medicine

## 2012-05-26 ENCOUNTER — Ambulatory Visit: Payer: BC Managed Care – PPO | Admitting: Psychology

## 2012-06-06 ENCOUNTER — Ambulatory Visit (INDEPENDENT_AMBULATORY_CARE_PROVIDER_SITE_OTHER): Payer: BC Managed Care – PPO | Admitting: Family Medicine

## 2012-06-06 DIAGNOSIS — Z86718 Personal history of other venous thrombosis and embolism: Secondary | ICD-10-CM

## 2012-06-06 DIAGNOSIS — Z8672 Personal history of thrombophlebitis: Secondary | ICD-10-CM

## 2012-06-06 DIAGNOSIS — I4891 Unspecified atrial fibrillation: Secondary | ICD-10-CM | POA: Diagnosis not present

## 2012-06-06 DIAGNOSIS — I4892 Unspecified atrial flutter: Secondary | ICD-10-CM

## 2012-06-06 DIAGNOSIS — Z7901 Long term (current) use of anticoagulants: Secondary | ICD-10-CM

## 2012-06-06 LAB — POCT INR: INR: 3.8

## 2012-06-07 ENCOUNTER — Encounter: Payer: Self-pay | Admitting: Internal Medicine

## 2012-06-07 ENCOUNTER — Ambulatory Visit (INDEPENDENT_AMBULATORY_CARE_PROVIDER_SITE_OTHER): Payer: BC Managed Care – PPO | Admitting: Internal Medicine

## 2012-06-07 VITALS — BP 132/62 | HR 78 | Temp 100.5°F | Ht 64.0 in | Wt 292.0 lb

## 2012-06-07 DIAGNOSIS — R7309 Other abnormal glucose: Secondary | ICD-10-CM

## 2012-06-07 DIAGNOSIS — J209 Acute bronchitis, unspecified: Secondary | ICD-10-CM

## 2012-06-07 DIAGNOSIS — I10 Essential (primary) hypertension: Secondary | ICD-10-CM | POA: Diagnosis not present

## 2012-06-07 DIAGNOSIS — J441 Chronic obstructive pulmonary disease with (acute) exacerbation: Secondary | ICD-10-CM | POA: Diagnosis not present

## 2012-06-07 DIAGNOSIS — R7302 Impaired glucose tolerance (oral): Secondary | ICD-10-CM

## 2012-06-07 MED ORDER — METHYLPREDNISOLONE ACETATE 80 MG/ML IJ SUSP
80.0000 mg | Freq: Once | INTRAMUSCULAR | Status: AC
  Administered 2012-06-07: 80 mg via INTRAMUSCULAR

## 2012-06-07 MED ORDER — LEVOFLOXACIN 250 MG PO TABS: 250.0000 mg | ORAL_TABLET | Freq: Every day | ORAL | Status: DC

## 2012-06-07 MED ORDER — SULFAMETHOXAZOLE-TRIMETHOPRIM 800-160 MG PO TABS: 1.0000 | ORAL_TABLET | Freq: Two times a day (BID) | ORAL | Status: DC

## 2012-06-07 MED ORDER — PREDNISONE 10 MG PO TABS: ORAL_TABLET | ORAL | Status: DC

## 2012-06-07 MED ORDER — HYDROCODONE-HOMATROPINE 5-1.5 MG/5ML PO SYRP: 5.0000 mL | ORAL_SOLUTION | Freq: Four times a day (QID) | ORAL | Status: DC | PRN

## 2012-06-07 NOTE — Assessment & Plan Note (Signed)
stable overall by history and exam, recent data reviewed with pt, and pt to continue medical treatment as before,  to f/u any worsening symptoms or concerns BP Readings from Last 3 Encounters:  06/07/12 132/62  05/09/12 138/72  05/02/12 118/72

## 2012-06-07 NOTE — Progress Notes (Signed)
Subjective:    Patient ID: [name] [name], female    DOB: [birth year], 62 y.o.   MRN: [id number]  HPI Here with acute onset mild to mod 2-3 days ST, HA, general weakness and malaise, with prod cough greenish sputum, but Pt denies chest pain, increased sob or doe,  orthopnea, PND, increased LE swelling, palpitations, dizziness or syncope, but has had increased mild wheezing since last night - mild, trying to get in early for help  Pt denies polydipsia, polyuria,   Pt states overall good compliance with meds.  Pt denies new neurological symptoms such as new headache, or facial or extremity weakness or numbness   Past Medical History  Diagnosis Date  . Chronic rhinitis   . Acute and chronic respiratory failure   . Morbid obesity     target wt=179lb for BMI<30 Peak wt 282lb  . DVT (deep venous thrombosis)   . COPD (chronic obstructive pulmonary disease)     [facility]ert. PFTs 07/25/06 FEV1 55% ratio 53, DLC0 51% HFA 50% 12/16/2009  . Depression   . Osteoporosis     last BMD 4/08 -2.4, intolerant of bisphosphonates  . Hypertension   . Arrhythmia     atrial fibrillation, anticoagulation therapy  . Anxiety   . Glucose intolerance (impaired glucose tolerance)     on steroids  . VITAMIN D DEFICIENCY   . HYPERLIPIDEMIA   . PREMATURE VENTRICULAR CONTRACTIONS   . Atrial flutter   . Rhabdomyolysis 07/29/2009  . FRACTURE, RIB, RIGHT 06/18/2009  . PULMONARY EMBOLISM, HX OF 06/19/2007  . Tremor    Past Surgical History  Procedure Laterality Date  . Abdominal hysterectomy    . Tonsillectomy    . Skin graft to middle r finger  1975  . S/p left arm fracture with fall off chair      reports that she quit smoking about 16 years ago. Her smoking use included Cigarettes. She has a 35 pack-year smoking history. She has never used smokeless tobacco. She reports that she does not drink alcohol or use illicit drugs. family history includes Asthma in her brother and son; Heart disease in her mother; and Throat cancer in  her brother. Allergies  Allergen Reactions  . Duloxetine     REACTION: rhabdomyolysis  . Penicillins    Current Outpatient Prescriptions on File Prior to Visit  Medication Sig Dispense Refill  . acetaminophen (TYLENOL ARTHRITIS PAIN) 650 MG CR tablet Take as directed when needed for pain      . Aclidinium Bromide (TUDORZA PRESSAIR) 400 MCG/ACT AEPB Inhale 1 puff into the lungs 2 (two) times daily.  3 each  3  . atorvastatin (LIPITOR) 10 MG tablet Take 1 tablet (10 mg total) by mouth at bedtime.  90 tablet  3  . budesonide-formoterol (SYMBICORT) 160-4.5 MCG/ACT inhaler Inhale 2 puffs into the lungs 2 (two) times daily.  3 Inhaler  3  . Calcium Carbonate-Vitamin D 600-400 MG-UNIT per tablet Take 1 tablet by mouth 2 (two) times daily.        . Cetirizine HCl (ZYRTEC ALLERGY) 10 MG CAPS Take 1 capsule (10 mg total) by mouth at bedtime.      . cholecalciferol (VITAMIN D) 1000 UNITS tablet Take 1,000 Units by mouth daily.        Marland Kitchen dextromethorphan-guaiFENesin (MUCINEX DM) 30-600 MG per 12 hr tablet 1 every 12 hours as needed w/flutter for cough/congestion      . diltiazem (CARDIZEM CD) 180 MG 24 hr capsule Take 1 capsule (180  mg total) by mouth daily.  90 capsule  3  . FLUoxetine (PROZAC) 40 MG capsule TAKE 1 CAPSULE DAILY  90 capsule  2  . furosemide (LASIX) 80 MG tablet Take 1 tablet (80 mg total) by mouth 2 (two) times daily.  180 tablet  4  . glucosamine-chondroitin 500-400 MG tablet Take 1 tablet by mouth daily.        Marland Kitchen levalbuterol (XOPENEX) 1.25 MG/3ML nebulizer solution Take 1.25 mg by nebulization every 4 (four) hours as needed.  300 mL  0  . mometasone (NASONEX) 50 MCG/ACT nasal spray Place 1 spray into the nose 2 (two) times daily as needed.      . nitroGLYCERIN (NITROSTAT) 0.4 MG SL tablet Place 1 tablet (0.4 mg total) under the tongue every 5 (five) minutes as needed for chest pain.  25 tablet  3  . nystatin (MYCOSTATIN) powder Use as directed to affected area twice per day as needed   30 g  1  . omeprazole (PRILOSEC) 20 MG capsule Take 1 capsule (20 mg total) by mouth daily.  90 capsule  3  . oxymetazoline (AFRIN) 0.05 % nasal spray Place 2 sprays into the nose 2 (two) times daily as needed for congestion. For 5 days before nasonex      . potassium chloride SA (K-DUR,KLOR-CON) 20 MEQ tablet Take 1 tablet twice daily      . primidone (MYSOLINE) 50 MG tablet 1/2 tablet at night for a week, then 1 tablet at night thereafter  30 tablet  3  . propafenone (RYTHMOL SR) 325 MG 12 hr capsule Take 1 capsule (325 mg total) by mouth 2 (two) times daily.  180 capsule  3  . sodium chloride (OCEAN) 0.65 % SOLN nasal spray 2 puffs every 4 hours as needed for nasal congestion      . traMADol (ULTRAM) 50 MG tablet Take 1 tablet (50 mg total) by mouth every 4 (four) hours as needed. For cough  45 tablet  1  . valsartan (DIOVAN) 320 MG tablet Take 1 tablet (320 mg total) by mouth daily.  90 tablet  4  . warfarin (COUMADIN) 7.5 MG tablet Take 1 tablet (7.5 mg total) by mouth daily. Take as directed by coumadin clinic  120 tablet  0  . XOPENEX HFA 45 MCG/ACT inhaler INHALE 2 PUFFS EVERY 4 HOURS AS NEEDED  3 Inhaler  0   No current facility-administered medications on file prior to visit.   Review of Systems  Constitutional: Negative for unexpected weight change, or unusual diaphoresis  HENT: Negative for tinnitus.   Eyes: Negative for photophobia and visual disturbance.  Respiratory: Negative for choking and stridor.   Gastrointestinal: Negative for vomiting and blood in stool.  Genitourinary: Negative for hematuria and decreased urine volume.  Musculoskeletal: Negative for acute joint swelling Skin: Negative for color change and wound.  Neurological: Negative for tremors and numbness other than noted  Psychiatric/Behavioral: Negative for decreased concentration or  hyperactivity.       Objective:   Physical Exam BP 132/62  Pulse 78  Temp(Src) 100.5 F (38.1 C) (Oral)  Ht 5\' 4"  (1.626  m)  Wt 292 lb (132.45 kg)  BMI 50.1 kg/m2  SpO2 95% VS noted, mild to mod ill Constitutional: Pt appears well-developed and well-nourished.  HENT: Head: NCAT.  Right Ear: External ear normal.  Left Ear: External ear normal.  Bilat tm's with mild erythema.  Max sinus areas mild tender.  Pharynx with mild erythema, no exudate  Eyes: Conjunctivae and EOM are normal. Pupils are equal, round, and reactive to light.  Neck: Normal range of motion. Neck supple.  Cardiovascular: Normal rate and irregular rhythm.   Pulmonary/Chest: Effort normal and breath sounds decresaed with bilat wheeze Neurological: Pt is alert. Not confused  Skin: Skin is warm. No erythema.  Psychiatric: Pt behavior is normal. Thought content normal.     Assessment & Plan:

## 2012-06-07 NOTE — Assessment & Plan Note (Signed)
Mild to mod, for antibx course,  to f/u any worsening symptoms or concerns 

## 2012-06-07 NOTE — Patient Instructions (Signed)
You had the steroid shot today Please take all new medication as prescribed - the antibiotic, cough medicine, and prednisone We will let Bailey Mech know of your change in treatment Please continue all other medications as before Please call or return if worse, for consideration for CXR or other treatment

## 2012-06-07 NOTE — Assessment & Plan Note (Signed)
Mild to mod, for predpack, cough med,  to f/u any worsening symptoms or concerns 

## 2012-06-07 NOTE — Assessment & Plan Note (Signed)
stable overall by history and exam, recent data reviewed with pt, and pt to continue medical treatment as before,  to f/u any worsening symptoms or concerns Lab Results  Component Value Date   HGBA1C 4.7 04/17/2012

## 2012-06-08 ENCOUNTER — Telehealth: Payer: Self-pay | Admitting: General Practice

## 2012-06-08 NOTE — Telephone Encounter (Signed)
Spoke with patient in regards to starting Cipro and Prednisone.  Gave patient dosing instructions and scheduled INR appointment for Tuesday 6/3.

## 2012-06-13 ENCOUNTER — Ambulatory Visit (INDEPENDENT_AMBULATORY_CARE_PROVIDER_SITE_OTHER): Payer: BC Managed Care – PPO | Admitting: Family Medicine

## 2012-06-13 DIAGNOSIS — Z8672 Personal history of thrombophlebitis: Secondary | ICD-10-CM | POA: Diagnosis not present

## 2012-06-13 DIAGNOSIS — I4892 Unspecified atrial flutter: Secondary | ICD-10-CM

## 2012-06-13 DIAGNOSIS — Z86718 Personal history of other venous thrombosis and embolism: Secondary | ICD-10-CM | POA: Diagnosis not present

## 2012-06-13 DIAGNOSIS — Z7901 Long term (current) use of anticoagulants: Secondary | ICD-10-CM

## 2012-06-13 DIAGNOSIS — I4891 Unspecified atrial fibrillation: Secondary | ICD-10-CM | POA: Diagnosis not present

## 2012-06-13 LAB — POCT INR: INR: 2.1

## 2012-06-16 ENCOUNTER — Encounter: Payer: Self-pay | Admitting: Internal Medicine

## 2012-06-16 MED ORDER — HYDROCODONE-HOMATROPINE 5-1.5 MG/5ML PO SYRP: 5.0000 mL | ORAL_SOLUTION | Freq: Four times a day (QID) | ORAL | Status: DC | PRN

## 2012-06-16 NOTE — Telephone Encounter (Signed)
Done hardcopy to robin  

## 2012-06-21 ENCOUNTER — Ambulatory Visit (INDEPENDENT_AMBULATORY_CARE_PROVIDER_SITE_OTHER): Payer: BC Managed Care – PPO | Admitting: Internal Medicine

## 2012-06-21 ENCOUNTER — Encounter: Payer: Self-pay | Admitting: Internal Medicine

## 2012-06-21 VITALS — BP 122/70 | HR 71 | Temp 97.8°F | Wt 284.1 lb

## 2012-06-21 DIAGNOSIS — J019 Acute sinusitis, unspecified: Secondary | ICD-10-CM | POA: Diagnosis not present

## 2012-06-21 DIAGNOSIS — J309 Allergic rhinitis, unspecified: Secondary | ICD-10-CM | POA: Diagnosis not present

## 2012-06-21 DIAGNOSIS — I10 Essential (primary) hypertension: Secondary | ICD-10-CM | POA: Diagnosis not present

## 2012-06-21 DIAGNOSIS — R7309 Other abnormal glucose: Secondary | ICD-10-CM

## 2012-06-21 DIAGNOSIS — R7302 Impaired glucose tolerance (oral): Secondary | ICD-10-CM

## 2012-06-21 MED ORDER — AZITHROMYCIN 250 MG PO TABS: ORAL_TABLET | ORAL | Status: DC

## 2012-06-21 MED ORDER — NYSTATIN 100000 UNIT/ML MT SUSP: 500000.0000 [IU] | Freq: Four times a day (QID) | OROMUCOSAL | Status: DC

## 2012-06-21 NOTE — Assessment & Plan Note (Signed)
stable overall by history and exam, recent data reviewed with pt, and pt to continue medical treatment as before,  to f/u any worsening symptoms or concerns BP Readings from Last 3 Encounters:  06/21/12 122/70  06/07/12 132/62  05/09/12 138/72

## 2012-06-21 NOTE — Patient Instructions (Signed)
Please take all new medication as prescribed Please continue all other medications as before, and refills have been done if requested. Please have the pharmacy call with any other refills you may need. Please keep your appointments with your specialists as you have planned  Thank you for enrolling in MyChart. Please follow the instructions below to securely access your online medical record. MyChart allows you to send messages to your doctor, view your test results, renew your prescriptions, schedule appointments, and more.

## 2012-06-21 NOTE — Assessment & Plan Note (Signed)
stable overall by history and exam, recent data reviewed with pt, and pt to continue medical treatment as before,  to f/u any worsening symptoms or concerns Lab Results  Component Value Date   HGBA1C 4.7 04/17/2012    

## 2012-06-21 NOTE — Progress Notes (Signed)
Subjective:    Patient ID: [name] [name], female    DOB: 08-03-1950, 62 y.o.   MRN: [id number]  HPI  Here with 2-3 days acute onset fever, facial pain, pressure, headache, general weakness and malaise, and greenish d/c, with mild ST and cough, but pt denies chest pain, wheezing, increased sob or doe, orthopnea, PND, increased LE swelling, palpitations, dizziness or syncope. Also with some thrush on tongue, does well with inhalers and rinses mouth with use Past Medical History  Diagnosis Date  . Chronic rhinitis   . Acute and chronic respiratory failure   . Morbid obesity     target wt=179lb for BMI<30 Peak wt 282lb  . DVT (deep venous thrombosis)   . COPD (chronic obstructive pulmonary disease)     [facility]. PFTs 07/25/06 FEV1 55% ratio 53, DLC0 51% HFA 50% 12/16/2009  . Depression   . Osteoporosis     last BMD 4/08 -2.4, intolerant of bisphosphonates  . Hypertension   . Arrhythmia     atrial fibrillation, anticoagulation therapy  . Anxiety   . Glucose intolerance (impaired glucose tolerance)     on steroids  . VITAMIN D DEFICIENCY   . HYPERLIPIDEMIA   . PREMATURE VENTRICULAR CONTRACTIONS   . Atrial flutter   . Rhabdomyolysis 07/29/2009  . FRACTURE, RIB, RIGHT 06/18/2009  . PULMONARY EMBOLISM, HX OF 06/19/2007  . Tremor    Past Surgical History  Procedure Laterality Date  . Abdominal hysterectomy    . Tonsillectomy    . Skin graft to middle r finger  1975  . S/p left arm fracture with fall off chair      reports that she quit smoking about 16 years ago. Her smoking use included Cigarettes. She has a 35 pack-year smoking history. She has never used smokeless tobacco. She reports that she does not drink alcohol or use illicit drugs. family history includes Asthma in her brother and son; Heart disease in her mother; and Throat cancer in her brother. Allergies  Allergen Reactions  . Duloxetine     REACTION: rhabdomyolysis  . Penicillins    Current Outpatient Prescriptions on File  Prior to Visit  Medication Sig Dispense Refill  . acetaminophen (TYLENOL ARTHRITIS PAIN) 650 MG CR tablet Take as directed when needed for pain      . Aclidinium Bromide (TUDORZA PRESSAIR) 400 MCG/ACT AEPB Inhale 1 puff into the lungs 2 (two) times daily.  3 each  3  . budesonide-formoterol (SYMBICORT) 160-4.5 MCG/ACT inhaler Inhale 2 puffs into the lungs 2 (two) times daily.  3 Inhaler  3  . Calcium Carbonate-Vitamin D 600-400 MG-UNIT per tablet Take 1 tablet by mouth 2 (two) times daily.        . Cetirizine HCl (ZYRTEC ALLERGY) 10 MG CAPS Take 1 capsule (10 mg total) by mouth at bedtime.      . cholecalciferol (VITAMIN D) 1000 UNITS tablet Take 1,000 Units by mouth daily.        Marland Kitchen dextromethorphan-guaiFENesin (MUCINEX DM) 30-600 MG per 12 hr tablet 1 every 12 hours as needed w/flutter for cough/congestion      . diltiazem (CARDIZEM CD) 180 MG 24 hr capsule Take 1 capsule (180 mg total) by mouth daily.  90 capsule  3  . FLUoxetine (PROZAC) 40 MG capsule TAKE 1 CAPSULE DAILY  90 capsule  2  . furosemide (LASIX) 80 MG tablet Take 1 tablet (80 mg total) by mouth 2 (two) times daily.  180 tablet  4  . glucosamine-chondroitin 500-400  MG tablet Take 1 tablet by mouth daily.        Marland Kitchen HYDROcodone-homatropine (HYCODAN) 5-1.5 MG/5ML syrup Take 5 mLs by mouth every 6 (six) hours as needed for cough.  120 mL  1  . levalbuterol (XOPENEX) 1.25 MG/3ML nebulizer solution Take 1.25 mg by nebulization every 4 (four) hours as needed.  300 mL  0  . mometasone (NASONEX) 50 MCG/ACT nasal spray Place 1 spray into the nose 2 (two) times daily as needed.      . nystatin (MYCOSTATIN) powder Use as directed to affected area twice per day as needed  30 g  1  . omeprazole (PRILOSEC) 20 MG capsule Take 1 capsule (20 mg total) by mouth daily.  90 capsule  3  . oxymetazoline (AFRIN) 0.05 % nasal spray Place 2 sprays into the nose 2 (two) times daily as needed for congestion. For 5 days before nasonex      . potassium chloride  SA (K-DUR,KLOR-CON) 20 MEQ tablet Take 1 tablet twice daily      . predniSONE (DELTASONE) 10 MG tablet 3 tabs by mouth per day for 3 days,2tabs per day for 3 days,1tab per day for 3 days, then stop  18 tablet  0  . primidone (MYSOLINE) 50 MG tablet 1/2 tablet at night for a week, then 1 tablet at night thereafter  30 tablet  3  . propafenone (RYTHMOL SR) 325 MG 12 hr capsule Take 1 capsule (325 mg total) by mouth 2 (two) times daily.  180 capsule  3  . sodium chloride (OCEAN) 0.65 % SOLN nasal spray 2 puffs every 4 hours as needed for nasal congestion      . traMADol (ULTRAM) 50 MG tablet Take 1 tablet (50 mg total) by mouth every 4 (four) hours as needed. For cough  45 tablet  1  . valsartan (DIOVAN) 320 MG tablet Take 1 tablet (320 mg total) by mouth daily.  90 tablet  4  . warfarin (COUMADIN) 7.5 MG tablet Take 1 tablet (7.5 mg total) by mouth daily. Take as directed by coumadin clinic  120 tablet  0  . XOPENEX HFA 45 MCG/ACT inhaler INHALE 2 PUFFS EVERY 4 HOURS AS NEEDED  3 Inhaler  0  . atorvastatin (LIPITOR) 10 MG tablet Take 1 tablet (10 mg total) by mouth at bedtime.  90 tablet  3  . nitroGLYCERIN (NITROSTAT) 0.4 MG SL tablet Place 1 tablet (0.4 mg total) under the tongue every 5 (five) minutes as needed for chest pain.  25 tablet  3   No current facility-administered medications on file prior to visit.   Review of Systems  Constitutional: Negative for unexpected weight change, or unusual diaphoresis  HENT: Negative for tinnitus.   Eyes: Negative for photophobia and visual disturbance.  Respiratory: Negative for choking and stridor.   Gastrointestinal: Negative for vomiting and blood in stool.  Genitourinary: Negative for hematuria and decreased urine volume.  Musculoskeletal: Negative for acute joint swelling Skin: Negative for color change and wound.  Neurological: Negative for tremors and numbness other than noted  Psychiatric/Behavioral: Negative for decreased concentration or   hyperactivity.       Objective:   Physical Exam BP 122/70  Pulse 71  Temp(Src) 97.8 F (36.6 C) (Oral)  Wt 284 lb 2 oz (128.878 kg)  BMI 48.75 kg/m2  SpO2 97% VS noted, mild ill, on home o2 Constitutional: Pt appears well-developed and well-nourished.  HENT: Head: NCAT.  Right Ear: External ear normal.  Left  Ear: External ear normal.  Eyes: Conjunctivae and EOM are normal. Pupils are equal, round, and reactive to light.  Bilat tm's with mild erythema.  Max sinus areas mild tender.  Pharynx with mild erythema, no exudate, tongue with whitish coating Neck: Normal range of motion. Neck supple.  Cardiovascular: Normal rate and regular rhythm.   Pulmonary/Chest: Effort normal and breath sounds decreased without rales or wheezing.  Abd:  Soft, NT, non-distended, + BS Neurological: Pt is alert. Not confused  Skin: Skin is warm. No erythema.  Psychiatric: Pt behavior is normal. Thought content normal.     Assessment & Plan:

## 2012-06-21 NOTE — Assessment & Plan Note (Signed)
Mild to mod, for antibx course,  to f/u any worsening symptoms or concerns 

## 2012-06-21 NOTE — Assessment & Plan Note (Signed)
stable overall by history and exam, and pt to continue medical treatment as before,  to f/u any worsening symptoms or concerns 

## 2012-07-06 ENCOUNTER — Telehealth: Payer: Self-pay | Admitting: Internal Medicine

## 2012-07-06 NOTE — Telephone Encounter (Signed)
I spoke with pt. I advised her we just got qualifying sats on her in feb 2014. I advised her they are good for 1 year and usually the DME will let her/us know when she is due for re qualification. She voiced her understanding. Nothing further was needed

## 2012-07-11 ENCOUNTER — Ambulatory Visit (INDEPENDENT_AMBULATORY_CARE_PROVIDER_SITE_OTHER): Payer: BC Managed Care – PPO | Admitting: General Practice

## 2012-07-11 DIAGNOSIS — I4891 Unspecified atrial fibrillation: Secondary | ICD-10-CM

## 2012-07-11 DIAGNOSIS — I4892 Unspecified atrial flutter: Secondary | ICD-10-CM

## 2012-07-11 DIAGNOSIS — Z86718 Personal history of other venous thrombosis and embolism: Secondary | ICD-10-CM

## 2012-07-11 DIAGNOSIS — Z7901 Long term (current) use of anticoagulants: Secondary | ICD-10-CM

## 2012-07-11 DIAGNOSIS — Z8672 Personal history of thrombophlebitis: Secondary | ICD-10-CM

## 2012-07-11 LAB — POCT INR: INR: 2.5

## 2012-07-27 ENCOUNTER — Other Ambulatory Visit: Payer: Self-pay | Admitting: *Deleted

## 2012-07-27 MED ORDER — PROPAFENONE HCL ER 325 MG PO CP12: 325.0000 mg | ORAL_CAPSULE | Freq: Two times a day (BID) | ORAL | Status: DC

## 2012-08-08 ENCOUNTER — Ambulatory Visit (INDEPENDENT_AMBULATORY_CARE_PROVIDER_SITE_OTHER): Payer: BC Managed Care – PPO | Admitting: General Practice

## 2012-08-08 DIAGNOSIS — Z86718 Personal history of other venous thrombosis and embolism: Secondary | ICD-10-CM | POA: Diagnosis not present

## 2012-08-08 DIAGNOSIS — Z8672 Personal history of thrombophlebitis: Secondary | ICD-10-CM | POA: Diagnosis not present

## 2012-08-08 DIAGNOSIS — Z7901 Long term (current) use of anticoagulants: Secondary | ICD-10-CM

## 2012-08-08 DIAGNOSIS — I4891 Unspecified atrial fibrillation: Secondary | ICD-10-CM | POA: Diagnosis not present

## 2012-08-08 DIAGNOSIS — I4892 Unspecified atrial flutter: Secondary | ICD-10-CM | POA: Diagnosis not present

## 2012-08-08 LAB — POCT INR: INR: 2.5

## 2012-09-01 ENCOUNTER — Other Ambulatory Visit: Payer: Self-pay

## 2012-09-01 ENCOUNTER — Encounter: Payer: Self-pay | Admitting: Internal Medicine

## 2012-09-01 MED ORDER — OMEPRAZOLE 20 MG PO CPDR: 20.0000 mg | DELAYED_RELEASE_CAPSULE | Freq: Every day | ORAL | Status: DC

## 2012-09-05 ENCOUNTER — Ambulatory Visit (INDEPENDENT_AMBULATORY_CARE_PROVIDER_SITE_OTHER): Payer: BC Managed Care – PPO | Admitting: General Practice

## 2012-09-05 DIAGNOSIS — I4892 Unspecified atrial flutter: Secondary | ICD-10-CM | POA: Diagnosis not present

## 2012-09-05 DIAGNOSIS — I4891 Unspecified atrial fibrillation: Secondary | ICD-10-CM

## 2012-09-05 DIAGNOSIS — Z8672 Personal history of thrombophlebitis: Secondary | ICD-10-CM | POA: Diagnosis not present

## 2012-09-05 DIAGNOSIS — Z86718 Personal history of other venous thrombosis and embolism: Secondary | ICD-10-CM | POA: Diagnosis not present

## 2012-09-05 DIAGNOSIS — Z7901 Long term (current) use of anticoagulants: Secondary | ICD-10-CM

## 2012-09-05 LAB — POCT INR: INR: 2

## 2012-09-15 ENCOUNTER — Telehealth: Payer: Self-pay | Admitting: Internal Medicine

## 2012-09-15 NOTE — Telephone Encounter (Signed)
Spoke with pt and reviewed the following instructions from ov in 04/2012: Please remember to go to the  x-ray department downstairs for your tests - we will call you with the results when they are available.  Always wear the 02 @ 3 lpm when walking outside to help burn off the fat   If you are satisfied with your treatment plan let your doctor know and he/she can either refill your medications or you can return here when your prescription runs out.     If in any way you are not 100% satisfied,  please tell us.  If 100% better, tell your friends!       Pt states that she is not having problems at this time and is still following up with her primary physician and she will call us if she needs [place].

## 2012-10-11 DIAGNOSIS — H2589 Other age-related cataract: Secondary | ICD-10-CM | POA: Diagnosis not present

## 2012-10-11 DIAGNOSIS — H02839 Dermatochalasis of unspecified eye, unspecified eyelid: Secondary | ICD-10-CM | POA: Diagnosis not present

## 2012-10-17 ENCOUNTER — Encounter: Payer: Self-pay | Admitting: Internal Medicine

## 2012-10-17 ENCOUNTER — Ambulatory Visit (INDEPENDENT_AMBULATORY_CARE_PROVIDER_SITE_OTHER): Payer: BC Managed Care – PPO | Admitting: General Practice

## 2012-10-17 ENCOUNTER — Ambulatory Visit (INDEPENDENT_AMBULATORY_CARE_PROVIDER_SITE_OTHER): Payer: BC Managed Care – PPO | Admitting: Internal Medicine

## 2012-10-17 VITALS — BP 120/78 | HR 91 | Temp 99.1°F | Ht 64.0 in | Wt 287.2 lb

## 2012-10-17 DIAGNOSIS — I4891 Unspecified atrial fibrillation: Secondary | ICD-10-CM

## 2012-10-17 DIAGNOSIS — Z0000 Encounter for general adult medical examination without abnormal findings: Secondary | ICD-10-CM

## 2012-10-17 DIAGNOSIS — I4892 Unspecified atrial flutter: Secondary | ICD-10-CM

## 2012-10-17 DIAGNOSIS — Z8672 Personal history of thrombophlebitis: Secondary | ICD-10-CM | POA: Diagnosis not present

## 2012-10-17 DIAGNOSIS — R7302 Impaired glucose tolerance (oral): Secondary | ICD-10-CM

## 2012-10-17 DIAGNOSIS — E785 Hyperlipidemia, unspecified: Secondary | ICD-10-CM | POA: Diagnosis not present

## 2012-10-17 DIAGNOSIS — Z23 Encounter for immunization: Secondary | ICD-10-CM

## 2012-10-17 DIAGNOSIS — Z7901 Long term (current) use of anticoagulants: Secondary | ICD-10-CM

## 2012-10-17 DIAGNOSIS — Z86718 Personal history of other venous thrombosis and embolism: Secondary | ICD-10-CM | POA: Diagnosis not present

## 2012-10-17 DIAGNOSIS — R7309 Other abnormal glucose: Secondary | ICD-10-CM

## 2012-10-17 DIAGNOSIS — I10 Essential (primary) hypertension: Secondary | ICD-10-CM | POA: Diagnosis not present

## 2012-10-17 LAB — POCT INR: INR: 3.6

## 2012-10-17 MED ORDER — VALSARTAN 320 MG PO TABS: 320.0000 mg | ORAL_TABLET | Freq: Every day | ORAL | Status: DC

## 2012-10-17 MED ORDER — DILTIAZEM HCL ER COATED BEADS 180 MG PO CP24: 180.0000 mg | ORAL_CAPSULE | Freq: Every day | ORAL | Status: DC

## 2012-10-17 NOTE — Assessment & Plan Note (Signed)
stable overall by history and exam, recent data reviewed with pt, and pt to continue medical treatment as before,  to f/u any worsening symptoms or concerns BP Readings from Last 3 Encounters:  10/17/12 120/78  06/21/12 122/70  06/07/12 132/62

## 2012-10-17 NOTE — Patient Instructions (Addendum)
You had the flu shot today, and the Prevnar pneumonia shot Please remember to followup with your GYN for the yearly pap smear and/or mammogram as you do No blood work needed today Please continue all other medications as before, and refills have been done if requested. Please have the pharmacy call with any other refills you may need. Please keep your appointments with your specialists as you have planned  Please remember to sign up for My Chart if you have not done so, as this will be important to you in the future with finding out test results, communicating by private email, and scheduling acute appointments online when needed.  Please return in 6 months, or sooner if needed, with Lab testing done 3-5 days before

## 2012-10-17 NOTE — Assessment & Plan Note (Signed)
stable overall by history and exam, recent data reviewed with pt, and pt to continue medical treatment as before,  to f/u any worsening symptoms or concerns Lab Results  Component Value Date   LDLCALC 83 04/17/2012

## 2012-10-17 NOTE — Addendum Note (Signed)
Addended by: Scharlene Gloss B on: 10/17/2012 11:55 AM   Modules accepted: Orders

## 2012-10-17 NOTE — Assessment & Plan Note (Signed)
stable overall by history and exam, recent data reviewed with pt, and pt to continue medical treatment as before,  to f/u any worsening symptoms or concerns, for prevnar today,f/u lab next visit

## 2012-10-17 NOTE — Progress Notes (Addendum)
Subjective:    Patient ID: [name] [name], female    DOB: 25-May-1950, 62 y.o.   MRN: [id number]  HPI  Here to f/u; overall doing ok,  Pt denies chest pain, increased sob or doe, wheezing, orthopnea, PND, increased LE swelling, palpitations, dizziness or syncope.  Pt denies polydipsia, polyuria, or low sugar symptoms such as weakness or confusion improved with po intake.  Pt denies new neurological symptoms such as new headache, or facial or extremity weakness or numbness.   Pt states overall good compliance with meds, has been trying to follow lower cholesterol diet, with wt overall stable,  but little exercise however. Due for right eye cataract later this month, then prob left after that.  No new complaints.  Pt denies fever, wt loss, night sweats, loss of appetite, or other constitutional symptoms Past Medical History  Diagnosis Date  . Chronic rhinitis   . Acute and chronic respiratory failure   . Morbid obesity     target wt=179lb for BMI<30 Peak wt 282lb  . DVT (deep venous thrombosis)   . COPD (chronic obstructive pulmonary disease)     [place]ert. PFTs 07/25/06 FEV1 55% ratio 53, DLC0 51% HFA 50% 12/16/2009  . Depression   . Osteoporosis     last BMD 4/08 -2.4, intolerant of bisphosphonates  . Hypertension   . Arrhythmia     atrial fibrillation, anticoagulation therapy  . Anxiety   . Glucose intolerance (impaired glucose tolerance)     on steroids  . VITAMIN D DEFICIENCY   . HYPERLIPIDEMIA   . PREMATURE VENTRICULAR CONTRACTIONS   . Atrial flutter   . Rhabdomyolysis 07/29/2009  . FRACTURE, RIB, RIGHT 06/18/2009  . PULMONARY EMBOLISM, HX OF 06/19/2007  . Tremor    Past Surgical History  Procedure Laterality Date  . Abdominal hysterectomy    . Tonsillectomy    . Skin graft to middle r finger  1975  . S/p left arm fracture with fall off chair      reports that she quit smoking about 16 years ago. Her smoking use included Cigarettes. She has a 35 pack-year smoking history. She has never  used smokeless tobacco. She reports that she does not drink alcohol or use illicit drugs. family history includes Asthma in her brother and son; Heart disease in her mother; Throat cancer in her brother. Allergies  Allergen Reactions  . Duloxetine     REACTION: rhabdomyolysis  . Penicillins    Current Outpatient Prescriptions on File Prior to Visit  Medication Sig Dispense Refill  . acetaminophen (TYLENOL ARTHRITIS PAIN) 650 MG CR tablet Take as directed when needed for pain      . Aclidinium Bromide (TUDORZA PRESSAIR) 400 MCG/ACT AEPB Inhale 1 puff into the lungs 2 (two) times daily.  3 each  3  . azithromycin (ZITHROMAX Z-PAK) 250 MG tablet Use as directed  6 each  1  . budesonide-formoterol (SYMBICORT) 160-4.5 MCG/ACT inhaler Inhale 2 puffs into the lungs 2 (two) times daily.  3 Inhaler  3  . Calcium Carbonate-Vitamin D 600-400 MG-UNIT per tablet Take 1 tablet by mouth 2 (two) times daily.        . Cetirizine HCl (ZYRTEC ALLERGY) 10 MG CAPS Take 1 capsule (10 mg total) by mouth at bedtime.      . cholecalciferol (VITAMIN D) 1000 UNITS tablet Take 1,000 Units by mouth daily.        Marland Kitchen dextromethorphan-guaiFENesin (MUCINEX DM) 30-600 MG per 12 hr tablet 1 every 12 hours as  needed w/flutter for cough/congestion      . FLUoxetine (PROZAC) 40 MG capsule TAKE 1 CAPSULE DAILY  90 capsule  2  . furosemide (LASIX) 80 MG tablet Take 1 tablet (80 mg total) by mouth 2 (two) times daily.  180 tablet  4  . glucosamine-chondroitin 500-400 MG tablet Take 1 tablet by mouth daily.        Marland Kitchen levalbuterol (XOPENEX) 1.25 MG/3ML nebulizer solution Take 1.25 mg by nebulization every 4 (four) hours as needed.  300 mL  0  . mometasone (NASONEX) 50 MCG/ACT nasal spray Place 1 spray into the nose 2 (two) times daily as needed.      . nystatin (MYCOSTATIN) 100000 UNIT/ML suspension Take 5 mLs (500,000 Units total) by mouth 4 (four) times daily.  60 mL  0  . nystatin (MYCOSTATIN) powder Use as directed to affected area  twice per day as needed  30 g  1  . omeprazole (PRILOSEC) 20 MG capsule Take 1 capsule (20 mg total) by mouth daily.  90 capsule  3  . oxymetazoline (AFRIN) 0.05 % nasal spray Place 2 sprays into the nose 2 (two) times daily as needed for congestion. For 5 days before nasonex      . potassium chloride SA (K-DUR,KLOR-CON) 20 MEQ tablet Take 1 tablet twice daily      . propafenone (RYTHMOL SR) 325 MG 12 hr capsule Take 1 capsule (325 mg total) by mouth 2 (two) times daily.  180 capsule  3  . sodium chloride (OCEAN) 0.65 % SOLN nasal spray 2 puffs every 4 hours as needed for nasal congestion      . traMADol (ULTRAM) 50 MG tablet Take 1 tablet (50 mg total) by mouth every 4 (four) hours as needed. For cough  45 tablet  1  . warfarin (COUMADIN) 7.5 MG tablet Take 1 tablet (7.5 mg total) by mouth daily. Take as directed by coumadin clinic  120 tablet  0  . XOPENEX HFA 45 MCG/ACT inhaler INHALE 2 PUFFS EVERY 4 HOURS AS NEEDED  3 Inhaler  0  . atorvastatin (LIPITOR) 10 MG tablet Take 1 tablet (10 mg total) by mouth at bedtime.  90 tablet  3  . nitroGLYCERIN (NITROSTAT) 0.4 MG SL tablet Place 1 tablet (0.4 mg total) under the tongue every 5 (five) minutes as needed for chest pain.  25 tablet  3   No current facility-administered medications on file prior to visit.     Review of Systems  Constitutional: Negative for unexpected weight change, or unusual diaphoresis  HENT: Negative for tinnitus.   Eyes: Negative for photophobia and visual disturbance.  Respiratory: Negative for choking and stridor.   Gastrointestinal: Negative for vomiting and blood in stool.  Genitourinary: Negative for hematuria and decreased urine volume.  Musculoskeletal: Negative for acute joint swelling Skin: Negative for color change and wound.  Neurological: Negative for tremors and numbness other than noted  Psychiatric/Behavioral: Negative for decreased concentration or  hyperactivity.       Objective:   Physical Exam BP  120/78  Pulse 91  Temp(Src) 99.1 F (37.3 C) (Oral)  Ht 5\' 4"  (1.626 m)  Wt 287 lb 4 oz (130.296 kg)  BMI 49.28 kg/m2  SpO2 94% VS noted,  Constitutional: Pt appears well-developed and well-nourished.  HENT: Head: NCAT.  Right Ear: External ear normal.  Left Ear: External ear normal.  Eyes: Conjunctivae and EOM are normal. Pupils are equal, round, and reactive to light.  Neck: Normal range of  motion. Neck supple.  Cardiovascular: Normal rate and regular rhythm.   Pulmonary/Chest: Effort normal and breath sounds dereased, no rales or wheezing.  Neurological: Pt is alert. Not confused  Skin: Skin is warm. No erythema.  Psychiatric: Pt behavior is normal. Thought content normal.     Assessment & Plan:  Quality Measures addressed:  Mammogram:  pt declines and will self-refer

## 2012-10-22 ENCOUNTER — Other Ambulatory Visit: Payer: Self-pay | Admitting: Internal Medicine

## 2012-10-23 ENCOUNTER — Other Ambulatory Visit: Payer: Self-pay | Admitting: General Practice

## 2012-10-23 DIAGNOSIS — H2589 Other age-related cataract: Secondary | ICD-10-CM | POA: Diagnosis not present

## 2012-10-23 DIAGNOSIS — H25049 Posterior subcapsular polar age-related cataract, unspecified eye: Secondary | ICD-10-CM | POA: Diagnosis not present

## 2012-10-23 DIAGNOSIS — H25019 Cortical age-related cataract, unspecified eye: Secondary | ICD-10-CM | POA: Diagnosis not present

## 2012-10-23 DIAGNOSIS — H2510 Age-related nuclear cataract, unspecified eye: Secondary | ICD-10-CM | POA: Diagnosis not present

## 2012-10-23 MED ORDER — WARFARIN SODIUM 7.5 MG PO TABS: ORAL_TABLET | ORAL | Status: DC

## 2012-11-06 ENCOUNTER — Telehealth: Payer: Self-pay | Admitting: Internal Medicine

## 2012-11-06 MED ORDER — BUDESONIDE-FORMOTEROL FUMARATE 160-4.5 MCG/ACT IN AERO: 2.0000 | INHALATION_SPRAY | Freq: Two times a day (BID) | RESPIRATORY_TRACT | Status: DC

## 2012-11-06 NOTE — Telephone Encounter (Signed)
Pt is aware that medication has refilled.

## 2012-11-14 ENCOUNTER — Ambulatory Visit (INDEPENDENT_AMBULATORY_CARE_PROVIDER_SITE_OTHER): Payer: BC Managed Care – PPO | Admitting: General Practice

## 2012-11-14 DIAGNOSIS — Z86718 Personal history of other venous thrombosis and embolism: Secondary | ICD-10-CM

## 2012-11-14 DIAGNOSIS — Z7901 Long term (current) use of anticoagulants: Secondary | ICD-10-CM

## 2012-11-14 DIAGNOSIS — I4892 Unspecified atrial flutter: Secondary | ICD-10-CM

## 2012-11-14 DIAGNOSIS — Z8672 Personal history of thrombophlebitis: Secondary | ICD-10-CM | POA: Diagnosis not present

## 2012-11-14 DIAGNOSIS — I4891 Unspecified atrial fibrillation: Secondary | ICD-10-CM | POA: Diagnosis not present

## 2012-11-14 LAB — POCT INR: INR: 2.4

## 2012-11-14 NOTE — Progress Notes (Signed)
Pre-visit discussion using our clinic review tool. No additional management support is needed unless otherwise documented below in the visit note.  

## 2012-12-08 ENCOUNTER — Other Ambulatory Visit: Payer: Self-pay | Admitting: Internal Medicine

## 2012-12-12 ENCOUNTER — Ambulatory Visit (INDEPENDENT_AMBULATORY_CARE_PROVIDER_SITE_OTHER): Payer: BC Managed Care – PPO | Admitting: General Practice

## 2012-12-12 DIAGNOSIS — I4891 Unspecified atrial fibrillation: Secondary | ICD-10-CM

## 2012-12-12 DIAGNOSIS — Z8672 Personal history of thrombophlebitis: Secondary | ICD-10-CM | POA: Diagnosis not present

## 2012-12-12 DIAGNOSIS — Z7901 Long term (current) use of anticoagulants: Secondary | ICD-10-CM

## 2012-12-12 DIAGNOSIS — Z86718 Personal history of other venous thrombosis and embolism: Secondary | ICD-10-CM | POA: Diagnosis not present

## 2012-12-12 DIAGNOSIS — I4892 Unspecified atrial flutter: Secondary | ICD-10-CM | POA: Diagnosis not present

## 2012-12-12 LAB — POCT INR: INR: 3.5

## 2012-12-12 NOTE — Progress Notes (Signed)
Pre-visit discussion using our clinic review tool. No additional management support is needed unless otherwise documented below in the visit note.  

## 2012-12-14 ENCOUNTER — Other Ambulatory Visit: Payer: Self-pay | Admitting: Cardiology

## 2012-12-15 NOTE — Telephone Encounter (Signed)
Stanton Kidney can you please clarify how the patient should be taking this? I see the phone note where she is requesting to only take bid, but it is not clear as to what decision was made on that. Looking at her fill date it looks like she has not been taking as prescribed. Please advise. Thanks, MI

## 2012-12-15 NOTE — Telephone Encounter (Signed)
Everything I see is 20 meq bid. Would have to call and ask the pt when and who changed it.

## 2012-12-21 ENCOUNTER — Telehealth: Payer: Self-pay | Admitting: Internal Medicine

## 2012-12-21 ENCOUNTER — Other Ambulatory Visit: Payer: Self-pay | Admitting: *Deleted

## 2012-12-21 MED ORDER — TRAMADOL HCL 50 MG PO TABS: 50.0000 mg | ORAL_TABLET | ORAL | Status: DC | PRN

## 2012-12-21 MED ORDER — NITROGLYCERIN 0.4 MG SL SUBL: 0.4000 mg | SUBLINGUAL_TABLET | SUBLINGUAL | Status: DC | PRN

## 2012-12-21 NOTE — Telephone Encounter (Signed)
Spoke with pt. Advised her that if she wanted a refill on Tramadol, she would need to be seen by Dr. Sherene Sires. States that she doesn't feel like she needs to be seen, she is going to see if Dr. Jonny Ruiz will fill it for her.

## 2012-12-21 NOTE — Telephone Encounter (Signed)
Patient is requesting a refill on tramadol 50 mg.  Please advise.

## 2012-12-21 NOTE — Telephone Encounter (Signed)
Done hardcopy to robin  

## 2012-12-21 NOTE — Telephone Encounter (Signed)
Faxed hardcopy to [facility] [address] [place] 

## 2013-01-01 ENCOUNTER — Telehealth: Payer: Self-pay | Admitting: Internal Medicine

## 2013-01-01 ENCOUNTER — Other Ambulatory Visit: Payer: Self-pay | Admitting: Internal Medicine

## 2013-01-01 NOTE — Telephone Encounter (Signed)
Patient called requesting a refill on hydrocodone  °Please advise if this can be done °

## 2013-01-02 NOTE — Telephone Encounter (Signed)
I dont see on the chart where she have been having this medication on a regular basis in the past  I can see refills of tramadol, and would be more approp I think; let me know if pt ok with this  Also:  Please be aware that I will no longer be able to offer monthly refills of any Schedule II or higher medication starting Feb 11, 2013 due to change in [place] law and [place] [facility] regulations

## 2013-01-02 NOTE — Telephone Encounter (Signed)
Called the patient informed of  MD instructions. The patient was requesting a refill on cough medication due to developing cough..  She stated she is seeing Dr. Jonny Ruiz at 9:15 in the morning 01/03/13 and can address problem then.

## 2013-01-03 ENCOUNTER — Encounter: Payer: Self-pay | Admitting: Internal Medicine

## 2013-01-03 ENCOUNTER — Other Ambulatory Visit: Payer: Self-pay | Admitting: Internal Medicine

## 2013-01-03 ENCOUNTER — Ambulatory Visit (INDEPENDENT_AMBULATORY_CARE_PROVIDER_SITE_OTHER): Payer: BC Managed Care – PPO | Admitting: Internal Medicine

## 2013-01-03 VITALS — BP 122/70 | HR 82 | Temp 97.7°F | Ht 64.0 in | Wt 290.0 lb

## 2013-01-03 DIAGNOSIS — J209 Acute bronchitis, unspecified: Secondary | ICD-10-CM | POA: Insufficient documentation

## 2013-01-03 DIAGNOSIS — J449 Chronic obstructive pulmonary disease, unspecified: Secondary | ICD-10-CM | POA: Diagnosis not present

## 2013-01-03 DIAGNOSIS — I10 Essential (primary) hypertension: Secondary | ICD-10-CM

## 2013-01-03 DIAGNOSIS — Z0000 Encounter for general adult medical examination without abnormal findings: Secondary | ICD-10-CM

## 2013-01-03 DIAGNOSIS — Z23 Encounter for immunization: Secondary | ICD-10-CM | POA: Diagnosis not present

## 2013-01-03 MED ORDER — LEVOFLOXACIN 250 MG PO TABS: 250.0000 mg | ORAL_TABLET | Freq: Every day | ORAL | Status: DC

## 2013-01-03 MED ORDER — HYDROCODONE-HOMATROPINE 5-1.5 MG/5ML PO SYRP: 5.0000 mL | ORAL_SOLUTION | Freq: Four times a day (QID) | ORAL | Status: DC | PRN

## 2013-01-03 NOTE — Addendum Note (Signed)
Addended by: Scharlene Gloss B on: 01/03/2013 09:52 AM   Modules accepted: Orders

## 2013-01-03 NOTE — Assessment & Plan Note (Signed)
stable overall by history and exam, recent data reviewed with pt, and pt to continue medical treatment as before,  to f/u any worsening symptoms or concerns SpO2 Readings from Last 3 Encounters:  01/03/13 94%  10/17/12 94%  06/21/12 97%

## 2013-01-03 NOTE — Progress Notes (Signed)
Pre-visit discussion using our clinic review tool. No additional management support is needed unless otherwise documented below in the visit note.  

## 2013-01-03 NOTE — Patient Instructions (Addendum)
You had the new Prevnar pneumonia shot today Please take all new medication as prescribed - the antibiotic, and cough medicine Please continue all other medications as before, and refills have been done if requested. Please have the pharmacy call with any other refills you may need. Please keep your appointments with your specialists as you have planned  Please remember to sign up for My Chart if you have not done so, as this will be important to you in the future with finding out test results, communicating by private email, and scheduling acute appointments online when needed.  You will be contacted regarding the referral for: mammogram

## 2013-01-03 NOTE — Progress Notes (Signed)
Subjective:    Patient ID: [name] [name], female    DOB: [birth year], 62 y.o.   MRN: [id number]  HPI  Here for wellness and f/u;  Overall doing ok;  Pt denies CP, worsening SOB, DOE, wheezing, orthopnea, PND, worsening LE edema, palpitations, dizziness or syncope.  Pt denies neurological change such as new headache, facial or extremity weakness.  Pt denies polydipsia, polyuria, or low sugar symptoms. Pt states overall good compliance with treatment and medications, good tolerability, and has been trying to follow lower cholesterol diet.  Pt denies worsening depressive symptoms, suicidal ideation or panic. No fever, night sweats, wt loss, loss of appetite, or other constitutional symptoms.  Pt states good ability with ADL's, has low fall risk, home safety reviewed and adequate, no other significant changes in hearing or vision, and only occasionally active with exercise. Here with acute onset mild to mod 5 days ST, HA, general weakness and malaise, with prod cough greenish sputum. No blood.No worsening wheezing so far. Past Medical History  Diagnosis Date  . Chronic rhinitis   . Acute and chronic respiratory failure   . Morbid obesity     target wt=179lb for BMI<30 Peak wt 282lb  . DVT (deep venous thrombosis)   . COPD (chronic obstructive pulmonary disease)     Wert. PFTs 07/25/06 FEV1 55% ratio 53, DLC0 51% HFA 50% 12/16/2009  . Depression   . Osteoporosis     last BMD 4/08 -2.4, intolerant of bisphosphonates  . Hypertension   . Arrhythmia     atrial fibrillation, anticoagulation therapy  . Anxiety   . Glucose intolerance (impaired glucose tolerance)     on steroids  . VITAMIN D DEFICIENCY   . HYPERLIPIDEMIA   . PREMATURE VENTRICULAR CONTRACTIONS   . Atrial flutter   . Rhabdomyolysis 07/29/2009  . FRACTURE, RIB, RIGHT 06/18/2009  . PULMONARY EMBOLISM, HX OF 06/19/2007  . Tremor    Past Surgical History  Procedure Laterality Date  . Abdominal hysterectomy    . Tonsillectomy    . Skin  graft to middle r finger  1975  . S/p left arm fracture with fall off chair      reports that she quit smoking about 16 years ago. Her smoking use included Cigarettes. She has a 35 pack-year smoking history. She has never used smokeless tobacco. She reports that she does not drink alcohol or use illicit drugs. family history includes Asthma in her brother and son; Heart disease in her mother; Throat cancer in her brother. Allergies  Allergen Reactions  . Duloxetine     REACTION: rhabdomyolysis  . Penicillins    Current Outpatient Prescriptions on File Prior to Visit  Medication Sig Dispense Refill  . acetaminophen (TYLENOL ARTHRITIS PAIN) 650 MG CR tablet Take as directed when needed for pain      . Aclidinium Bromide (TUDORZA PRESSAIR) 400 MCG/ACT AEPB Inhale 1 puff into the lungs 2 (two) times daily.  3 each  3  . atorvastatin (LIPITOR) 10 MG tablet TAKE 1 TABLET AT BEDTIME  90 tablet  1  . budesonide-formoterol (SYMBICORT) 160-4.5 MCG/ACT inhaler Inhale 2 puffs into the lungs 2 (two) times daily.  3 Inhaler  1  . Calcium Carbonate-Vitamin D 600-400 MG-UNIT per tablet Take 1 tablet by mouth 2 (two) times daily.        . Cetirizine HCl (ZYRTEC ALLERGY) 10 MG CAPS Take 1 capsule (10 mg total) by mouth at bedtime.      . cholecalciferol (VITAMIN D)  1000 UNITS tablet Take 1,000 Units by mouth daily.        Marland Kitchen dextromethorphan-guaiFENesin (MUCINEX DM) 30-600 MG per 12 hr tablet 1 every 12 hours as needed w/flutter for cough/congestion      . diltiazem (CARDIZEM CD) 180 MG 24 hr capsule Take 1 capsule (180 mg total) by mouth daily.  90 capsule  3  . furosemide (LASIX) 80 MG tablet Take 1 tablet (80 mg total) by mouth 2 (two) times daily.  180 tablet  4  . glucosamine-chondroitin 500-400 MG tablet Take 1 tablet by mouth daily.        Marland Kitchen KLOR-CON M20 20 MEQ tablet Take 1 tablet (20 mEq total) by mouth 2 (two) times daily.  180 tablet  0  . levalbuterol (XOPENEX) 1.25 MG/3ML nebulizer solution Take  1.25 mg by nebulization every 4 (four) hours as needed.  300 mL  0  . mometasone (NASONEX) 50 MCG/ACT nasal spray Place 1 spray into the nose 2 (two) times daily as needed.      . nitroGLYCERIN (NITROSTAT) 0.4 MG SL tablet Place 1 tablet (0.4 mg total) under the tongue every 5 (five) minutes as needed for chest pain.  25 tablet  3  . nystatin (MYCOSTATIN) 100000 UNIT/ML suspension Take 5 mLs (500,000 Units total) by mouth 4 (four) times daily.  60 mL  0  . nystatin (MYCOSTATIN) powder Use as directed to affected area twice per day as needed  30 g  1  . omeprazole (PRILOSEC) 20 MG capsule Take 1 capsule (20 mg total) by mouth daily.  90 capsule  3  . oxymetazoline (AFRIN) 0.05 % nasal spray Place 2 sprays into the nose 2 (two) times daily as needed for congestion. For 5 days before nasonex      . potassium chloride SA (K-DUR,KLOR-CON) 20 MEQ tablet Take 1 tablet twice daily      . propafenone (RYTHMOL SR) 325 MG 12 hr capsule Take 1 capsule (325 mg total) by mouth 2 (two) times daily.  180 capsule  3  . sodium chloride (OCEAN) 0.65 % SOLN nasal spray 2 puffs every 4 hours as needed for nasal congestion      . traMADol (ULTRAM) 50 MG tablet Take 1 tablet (50 mg total) by mouth every 4 (four) hours as needed. For cough  60 tablet  1  . valsartan (DIOVAN) 320 MG tablet Take 1 tablet (320 mg total) by mouth daily.  90 tablet  3  . warfarin (COUMADIN) 7.5 MG tablet TAKE AS DIRECTED BY COUMADIN CLINIC  120 tablet  0  . XOPENEX HFA 45 MCG/ACT inhaler INHALE 2 PUFFS EVERY 4 HOURS AS NEEDED  3 Inhaler  0  . atorvastatin (LIPITOR) 10 MG tablet Take 1 tablet (10 mg total) by mouth at bedtime.  90 tablet  3   No current facility-administered medications on file prior to visit.   Review of Systems Constitutional: Negative for diaphoresis, activity change, appetite change or unexpected weight change.  HENT: Negative for hearing loss, ear pain, facial swelling, mouth sores and neck stiffness.   Eyes: Negative  for pain, redness and visual disturbance.  Respiratory: Negative for shortness of breath and wheezing.   Cardiovascular: Negative for chest pain and palpitations.  Gastrointestinal: Negative for diarrhea, blood in stool, abdominal distention or other pain Genitourinary: Negative for hematuria, flank pain or change in urine volume.  Musculoskeletal: Negative for myalgias and joint swelling.  Skin: Negative for color change and wound.  Neurological: Negative for  syncope and numbness. other than noted Hematological: Negative for adenopathy.  Psychiatric/Behavioral: Negative for hallucinations, self-injury, decreased concentration and agitation.      Objective:   Physical Exam BP 122/70  Pulse 82  Temp(Src) 97.7 F (36.5 C) (Oral)  Ht 5\' 4"  (1.626 m)  Wt 290 lb (131.543 kg)  BMI 49.75 kg/m2  SpO2 94% VS noted, mild ill Constitutional: Pt appears well-developed and well-nourished.  HENT: Head: NCAT.  Right Ear: External ear normal.  Left Ear: External ear normal.  Eyes: Conjunctivae and EOM are normal. Pupils are equal, round, and reactive to light.  Neck: Normal range of motion. Neck supple.  Cardiovascular: Normal rate and regular rhythm.   Pulmonary/Chest: Effort normal and breath sounds decreased bilat, no rales or wheezing.  Neurological: Pt is alert. Not confused  Skin: Skin is warm. No erythema.  Psychiatric: Pt behavior is normal. Thought content normal.         Assessment & Plan:

## 2013-01-03 NOTE — Assessment & Plan Note (Signed)
stable overall by history and exam, recent data reviewed with pt, and pt to continue medical treatment as before,  to f/u any worsening symptoms or concerns BP Readings from Last 3 Encounters:  01/03/13 122/70  10/17/12 120/78  06/21/12 122/70

## 2013-01-03 NOTE — Assessment & Plan Note (Signed)
Mild to mod, for antibx course,  to f/u any worsening symptoms or concerns 

## 2013-01-09 ENCOUNTER — Encounter: Payer: Self-pay | Admitting: Internal Medicine

## 2013-01-12 ENCOUNTER — Telehealth: Payer: Self-pay | Admitting: Internal Medicine

## 2013-01-12 MED ORDER — LEVALBUTEROL HCL 1.25 MG/3ML IN NEBU: 1.2500 mg | INHALATION_SOLUTION | RESPIRATORY_TRACT | Status: DC | PRN

## 2013-01-12 NOTE — Telephone Encounter (Signed)
Called and spoke with pt and she stated that she didn't realize the this medication had not been filled in a while.  She is requesting that this be sent to her mail order pharmacy.  This med has been sent and pt is aware.

## 2013-01-15 ENCOUNTER — Telehealth: Payer: Self-pay | Admitting: General Practice

## 2013-01-15 ENCOUNTER — Other Ambulatory Visit: Payer: Self-pay | Admitting: Internal Medicine

## 2013-01-15 ENCOUNTER — Telehealth: Payer: Self-pay | Admitting: Internal Medicine

## 2013-01-15 MED ORDER — LEVALBUTEROL HCL 1.25 MG/3ML IN NEBU: 1.2500 mg | INHALATION_SOLUTION | RESPIRATORY_TRACT | Status: DC | PRN

## 2013-01-15 NOTE — Telephone Encounter (Signed)
Attempted to return call to patient. Unable to leave message.

## 2013-01-15 NOTE — Telephone Encounter (Signed)
appt was cancel. Closing note...lmb

## 2013-01-15 NOTE — Telephone Encounter (Signed)
Patient Information:  Caller Name: Angalena  Phone: [phone]  Patient: [name] [name], [name] [name]  Gender: Female  DOB: Jan 05, 1951  Age: 63 Years  PCP: Cathlean Cower (Adults only)  Office Follow Up:  Does the office need to follow up with this patient?: Yes  Instructions For The Office: Pt has an appt on 01/16/13 at 1130 at the coumadin clinic. She would like to cancel that as she's too sick. Requests a call back from [place].  RN Note:  Pt began vomiting w/diarrhea, "high fever" ( she doesn't have a thermometer) on 01/11/13. Vomiting 3-4 x q d, voiding small amts. RN called the ofc as the only appt available is at 1615, pt needs care before then. OK to send to ED or UC. She will go to [facility] now.  Symptoms  Reason For Call & Symptoms: Vomiting and diarrhea  Reviewed Health History In EMR: Yes  Reviewed Medications In EMR: Yes  Reviewed Allergies In EMR: Yes  Reviewed Surgeries / Procedures: Yes  Date of Onset of Symptoms: 01/11/2013  Guideline(s) Used:  Vomiting  Disposition Per Guideline:   Go to ED Now  Reason For Disposition Reached:   Moderate vomiting (e.g., 3 - 5 times/day) and age > 14  Advice Given:  Clear Liquids:  Sip water or a rehydration drink (e.g., Gatorade or Powerade).  Call Back If:  You become worse.  RN Overrode Recommendation:  Go To [facility]  OK'd per ofc

## 2013-01-17 ENCOUNTER — Ambulatory Visit (INDEPENDENT_AMBULATORY_CARE_PROVIDER_SITE_OTHER): Payer: BC Managed Care – PPO | Admitting: Internal Medicine

## 2013-01-17 ENCOUNTER — Encounter: Payer: Self-pay | Admitting: Internal Medicine

## 2013-01-17 VITALS — BP 146/70 | HR 87 | Temp 98.5°F | Resp 18 | Wt 273.0 lb

## 2013-01-17 DIAGNOSIS — I10 Essential (primary) hypertension: Secondary | ICD-10-CM | POA: Diagnosis not present

## 2013-01-17 DIAGNOSIS — J209 Acute bronchitis, unspecified: Secondary | ICD-10-CM | POA: Diagnosis not present

## 2013-01-17 DIAGNOSIS — R7302 Impaired glucose tolerance (oral): Secondary | ICD-10-CM

## 2013-01-17 DIAGNOSIS — R7309 Other abnormal glucose: Secondary | ICD-10-CM | POA: Diagnosis not present

## 2013-01-17 DIAGNOSIS — J441 Chronic obstructive pulmonary disease with (acute) exacerbation: Secondary | ICD-10-CM | POA: Diagnosis not present

## 2013-01-17 MED ORDER — HYDROCODONE-HOMATROPINE 5-1.5 MG/5ML PO SYRP: 5.0000 mL | ORAL_SOLUTION | Freq: Four times a day (QID) | ORAL | Status: DC | PRN

## 2013-01-17 MED ORDER — METHYLPREDNISOLONE ACETATE 80 MG/ML IJ SUSP
80.0000 mg | Freq: Once | INTRAMUSCULAR | Status: AC
  Administered 2013-01-17: 80 mg via INTRAMUSCULAR

## 2013-01-17 MED ORDER — PREDNISONE 10 MG PO TABS: ORAL_TABLET | ORAL | Status: DC

## 2013-01-17 MED ORDER — SULFAMETHOXAZOLE-TRIMETHOPRIM 800-160 MG PO TABS: 1.0000 | ORAL_TABLET | Freq: Two times a day (BID) | ORAL | Status: DC

## 2013-01-17 NOTE — Patient Instructions (Addendum)
You had the steroid shot today Please take all new medication as prescribed  - the antibiotic, cough medicine, and prednisone Please continue all other medications as before, and refills have been done if requested. Please have the pharmacy call with any other refills you may need.  Please call for any worsening fever, cough, breathing worse or pain

## 2013-01-17 NOTE — Assessment & Plan Note (Signed)
stable overall by history and exam, recent data reviewed with pt, and pt to continue medical treatment as before,  to f/u any worsening symptoms or concerns BP Readings from Last 3 Encounters:  01/17/13 146/70  01/03/13 122/70  10/17/12 120/78

## 2013-01-17 NOTE — Assessment & Plan Note (Signed)
Mild to mod, for depomedrol 80 IM, predpack for home,  to f/u any worsening symptoms or concerns

## 2013-01-17 NOTE — Assessment & Plan Note (Signed)
Mild to mod, for antibx course,  to f/u any worsening symptoms or concerns 

## 2013-01-17 NOTE — Progress Notes (Signed)
Pre-visit discussion using our clinic review tool. No additional management support is needed unless otherwise documented below in the visit note.  

## 2013-01-17 NOTE — Progress Notes (Signed)
Subjective:    Patient ID: [name] [name], female    DOB: [birth year], 63 y.o.   MRN: [id number]  HPI  Here to f/u with acute, was better after last visit, but now Here with acute onset mild to mod 1 wk ST, HA, general weakness and malaise, with prod cough greenish sputum, but Pt denies chest pain, increased sob or doe, wheezing, orthopnea, PND, increased LE swelling, palpitations, dizziness or syncope; except onset wheezing last 2 days. Past Medical History  Diagnosis Date  . Chronic rhinitis   . Acute and chronic respiratory failure   . Morbid obesity     target wt=179lb for BMI<30 Peak wt 282lb  . DVT (deep venous thrombosis)   . COPD (chronic obstructive pulmonary disease)     Wert. PFTs 07/25/06 FEV1 55% ratio 53, DLC0 51% HFA 50% 12/16/2009  . Depression   . Osteoporosis     last BMD 4/08 -2.4, intolerant of bisphosphonates  . Hypertension   . Arrhythmia     atrial fibrillation, anticoagulation therapy  . Anxiety   . Glucose intolerance (impaired glucose tolerance)     on steroids  . VITAMIN D DEFICIENCY   . HYPERLIPIDEMIA   . PREMATURE VENTRICULAR CONTRACTIONS   . Atrial flutter   . Rhabdomyolysis 07/29/2009  . FRACTURE, RIB, RIGHT 06/18/2009  . PULMONARY EMBOLISM, HX OF 06/19/2007  . Tremor    Past Surgical History  Procedure Laterality Date  . Abdominal hysterectomy    . Tonsillectomy    . Skin graft to middle r finger  1975  . S/p left arm fracture with fall off chair      reports that she quit smoking about 17 years ago. Her smoking use included Cigarettes. She has a 35 pack-year smoking history. She has never used smokeless tobacco. She reports that she does not drink alcohol or use illicit drugs. family history includes Asthma in her brother and son; Heart disease in her mother; Throat cancer in her brother. Allergies  Allergen Reactions  . Duloxetine     REACTION: rhabdomyolysis  . Penicillins    Current Outpatient Prescriptions on File Prior to Visit    Medication Sig Dispense Refill  . acetaminophen (TYLENOL ARTHRITIS PAIN) 650 MG CR tablet Take as directed when needed for pain      . Aclidinium Bromide (TUDORZA PRESSAIR) 400 MCG/ACT AEPB Inhale 1 puff into the lungs 2 (two) times daily.  3 each  3  . atorvastatin (LIPITOR) 10 MG tablet TAKE 1 TABLET AT BEDTIME  90 tablet  1  . budesonide-formoterol (SYMBICORT) 160-4.5 MCG/ACT inhaler Inhale 2 puffs into the lungs 2 (two) times daily.  3 Inhaler  1  . Calcium Carbonate-Vitamin D 600-400 MG-UNIT per tablet Take 1 tablet by mouth 2 (two) times daily.        . Cetirizine HCl (ZYRTEC ALLERGY) 10 MG CAPS Take 1 capsule (10 mg total) by mouth at bedtime.      . cholecalciferol (VITAMIN D) 1000 UNITS tablet Take 1,000 Units by mouth daily.        Marland Kitchen dextromethorphan-guaiFENesin (MUCINEX DM) 30-600 MG per 12 hr tablet 1 every 12 hours as needed w/flutter for cough/congestion      . diltiazem (CARDIZEM CD) 180 MG 24 hr capsule Take 1 capsule (180 mg total) by mouth daily.  90 capsule  3  . FLUoxetine (PROZAC) 40 MG capsule TAKE 1 CAPSULE DAILY  90 capsule  1  . furosemide (LASIX) 80 MG tablet Take 1 tablet (  80 mg total) by mouth 2 (two) times daily.  180 tablet  4  . glucosamine-chondroitin 500-400 MG tablet Take 1 tablet by mouth daily.        Marland Kitchen KLOR-CON M20 20 MEQ tablet Take 1 tablet (20 mEq total) by mouth 2 (two) times daily.  180 tablet  0  . levalbuterol (XOPENEX) 1.25 MG/3ML nebulizer solution Take 1.25 mg by nebulization every 4 (four) hours as needed.  300 mL  3  . levofloxacin (LEVAQUIN) 250 MG tablet Take 1 tablet (250 mg total) by mouth daily.  10 tablet  0  . mometasone (NASONEX) 50 MCG/ACT nasal spray Place 1 spray into the nose 2 (two) times daily as needed.      . nitroGLYCERIN (NITROSTAT) 0.4 MG SL tablet Place 1 tablet (0.4 mg total) under the tongue every 5 (five) minutes as needed for chest pain.  25 tablet  3  . nystatin (MYCOSTATIN) 100000 UNIT/ML suspension Take 5 mLs (500,000  Units total) by mouth 4 (four) times daily.  60 mL  0  . nystatin (MYCOSTATIN) powder Use as directed to affected area twice per day as needed  30 g  1  . omeprazole (PRILOSEC) 20 MG capsule Take 1 capsule (20 mg total) by mouth daily.  90 capsule  3  . oxymetazoline (AFRIN) 0.05 % nasal spray Place 2 sprays into the nose 2 (two) times daily as needed for congestion. For 5 days before nasonex      . potassium chloride SA (K-DUR,KLOR-CON) 20 MEQ tablet Take 1 tablet twice daily      . propafenone (RYTHMOL SR) 325 MG 12 hr capsule Take 1 capsule (325 mg total) by mouth 2 (two) times daily.  180 capsule  3  . sodium chloride (OCEAN) 0.65 % SOLN nasal spray 2 puffs every 4 hours as needed for nasal congestion      . traMADol (ULTRAM) 50 MG tablet Take 1 tablet (50 mg total) by mouth every 4 (four) hours as needed. For cough  60 tablet  1  . valsartan (DIOVAN) 320 MG tablet Take 1 tablet (320 mg total) by mouth daily.  90 tablet  3  . warfarin (COUMADIN) 7.5 MG tablet TAKE AS DIRECTED BY COUMADIN CLINIC  120 tablet  0  . XOPENEX HFA 45 MCG/ACT inhaler INHALE 2 PUFFS EVERY 4 HOURS AS NEEDED  3 Inhaler  0  . atorvastatin (LIPITOR) 10 MG tablet Take 1 tablet (10 mg total) by mouth at bedtime.  90 tablet  3   No current facility-administered medications on file prior to visit.   Review of Systems  Constitutional: Negative for unexpected weight change, or unusual diaphoresis  HENT: Negative for tinnitus.   Eyes: Negative for photophobia and visual disturbance.  Respiratory: Negative for choking and stridor.   Gastrointestinal: Negative for vomiting and blood in stool.  Genitourinary: Negative for hematuria and decreased urine volume.  Musculoskeletal: Negative for acute joint swelling Skin: Negative for color change and wound.  Neurological: Negative for tremors and numbness other than noted  Psychiatric/Behavioral: Negative for decreased concentration or  hyperactivity.       Objective:    Physical Exam BP 146/70  Pulse 87  Temp(Src) 98.5 F (36.9 C) (Oral)  Resp 18  Wt 273 lb (123.832 kg)  SpO2 89% VS noted, mild ill Constitutional: Pt appears well-developed and well-nourished.  HENT: Head: NCAT.  Right Ear: External ear normal.  Left Ear: External ear normal.  Bilat tm's with mild erythema.  Max sinus areas non tender.  Pharynx with mild erythema, no exudate Eyes: Conjunctivae and EOM are normal. Pupils are equal, round, and reactive to light.  Neck: Normal range of motion. Neck supple.  Cardiovascular: Normal rate and regular rhythm.   Pulmonary/Chest: Effort normal and breath sounds decreased, mild wheeze diffuse bilat  Neurological: Pt is alert. Not confused  Skin: Skin is warm. No erythema.  Psychiatric: Pt behavior is normal. Thought content normal.     Assessment & Plan:

## 2013-01-17 NOTE — Assessment & Plan Note (Signed)
asympt -  Lab Results  Component Value Date   HGBA1C 4.7 04/17/2012   To call for onset polys with steroid tx

## 2013-01-18 ENCOUNTER — Telehealth: Payer: Self-pay | Admitting: Internal Medicine

## 2013-01-18 ENCOUNTER — Encounter: Payer: Self-pay | Admitting: Internal Medicine

## 2013-01-18 NOTE — Telephone Encounter (Signed)
Relevant patient education assigned to patient using Emmi. ° °

## 2013-01-26 ENCOUNTER — Ambulatory Visit (INDEPENDENT_AMBULATORY_CARE_PROVIDER_SITE_OTHER): Payer: BC Managed Care – PPO | Admitting: General Practice

## 2013-01-26 DIAGNOSIS — I4891 Unspecified atrial fibrillation: Secondary | ICD-10-CM

## 2013-01-26 DIAGNOSIS — Z86718 Personal history of other venous thrombosis and embolism: Secondary | ICD-10-CM | POA: Diagnosis not present

## 2013-01-26 DIAGNOSIS — I4892 Unspecified atrial flutter: Secondary | ICD-10-CM

## 2013-01-26 DIAGNOSIS — Z8672 Personal history of thrombophlebitis: Secondary | ICD-10-CM

## 2013-01-26 DIAGNOSIS — Z7901 Long term (current) use of anticoagulants: Secondary | ICD-10-CM

## 2013-01-26 LAB — POCT INR: INR: 4.5

## 2013-01-26 NOTE — Progress Notes (Signed)
Pre-visit discussion using our clinic review tool. No additional management support is needed unless otherwise documented below in the visit note.  

## 2013-02-01 ENCOUNTER — Other Ambulatory Visit: Payer: Self-pay | Admitting: Internal Medicine

## 2013-02-01 ENCOUNTER — Ambulatory Visit
Admission: RE | Admit: 2013-02-01 | Discharge: 2013-02-01 | Disposition: A | Discharge status: Home / Self Care | Payer: BC Managed Care – PPO | Source: Ambulatory Visit | Attending: Internal Medicine | Admitting: Internal Medicine

## 2013-02-01 DIAGNOSIS — Z0000 Encounter for general adult medical examination without abnormal findings: Secondary | ICD-10-CM

## 2013-02-01 DIAGNOSIS — Z1231 Encounter for screening mammogram for malignant neoplasm of breast: Secondary | ICD-10-CM

## 2013-02-01 LAB — HM MAMMOGRAPHY

## 2013-02-09 ENCOUNTER — Ambulatory Visit (INDEPENDENT_AMBULATORY_CARE_PROVIDER_SITE_OTHER): Payer: BC Managed Care – PPO | Admitting: General Practice

## 2013-02-09 DIAGNOSIS — Z5181 Encounter for therapeutic drug level monitoring: Secondary | ICD-10-CM

## 2013-02-09 DIAGNOSIS — Z86718 Personal history of other venous thrombosis and embolism: Secondary | ICD-10-CM | POA: Diagnosis not present

## 2013-02-09 DIAGNOSIS — I4892 Unspecified atrial flutter: Secondary | ICD-10-CM

## 2013-02-09 DIAGNOSIS — Z7901 Long term (current) use of anticoagulants: Secondary | ICD-10-CM

## 2013-02-09 DIAGNOSIS — Z8672 Personal history of thrombophlebitis: Secondary | ICD-10-CM

## 2013-02-09 DIAGNOSIS — I4891 Unspecified atrial fibrillation: Secondary | ICD-10-CM | POA: Diagnosis not present

## 2013-02-09 LAB — POCT INR: INR: 3

## 2013-02-09 NOTE — Progress Notes (Signed)
Pre-visit discussion using our clinic review tool. No additional management support is needed unless otherwise documented below in the visit note.  

## 2013-02-23 ENCOUNTER — Other Ambulatory Visit: Payer: Self-pay | Admitting: Cardiology

## 2013-03-05 ENCOUNTER — Telehealth: Payer: Self-pay | Admitting: Internal Medicine

## 2013-03-05 MED ORDER — LEVALBUTEROL TARTRATE 45 MCG/ACT IN AERO: INHALATION_SPRAY | RESPIRATORY_TRACT | Status: DC

## 2013-03-05 NOTE — Telephone Encounter (Signed)
Called spoke with pt. She needed her [facility]. Pt aware to keep pending appt. Nothing further needed and refill sent

## 2013-03-08 ENCOUNTER — Ambulatory Visit: Payer: BC Managed Care – PPO | Admitting: Physician Assistant

## 2013-03-13 ENCOUNTER — Encounter: Payer: Self-pay | Admitting: Internal Medicine

## 2013-03-13 ENCOUNTER — Ambulatory Visit (INDEPENDENT_AMBULATORY_CARE_PROVIDER_SITE_OTHER): Payer: BC Managed Care – PPO | Admitting: Internal Medicine

## 2013-03-13 ENCOUNTER — Ambulatory Visit (INDEPENDENT_AMBULATORY_CARE_PROVIDER_SITE_OTHER)
Admission: RE | Admit: 2013-03-13 | Discharge: 2013-03-13 | Disposition: A | Discharge status: Home / Self Care | Payer: BC Managed Care – PPO | Source: Ambulatory Visit | Attending: Internal Medicine | Admitting: Internal Medicine

## 2013-03-13 VITALS — BP 130/90 | HR 71 | Temp 97.9°F | Ht 62.5 in | Wt 282.0 lb

## 2013-03-13 DIAGNOSIS — J449 Chronic obstructive pulmonary disease, unspecified: Secondary | ICD-10-CM

## 2013-03-13 DIAGNOSIS — J9610 Chronic respiratory failure, unspecified whether with hypoxia or hypercapnia: Secondary | ICD-10-CM

## 2013-03-13 NOTE — Progress Notes (Signed)
Quick Note:  ATC, NA and no option to leave msg, WCB ______ 

## 2013-03-13 NOTE — Patient Instructions (Addendum)
Work on inhaler technique:  relax and gently blow all the way out then take a nice smooth deep breath back in, triggering the inhaler at same time you start breathing in.  Hold for up to 5 seconds if you can.  Rinse and gargle with water when done  Increase symbicort 160 Take 2 puffs first thing in am and then another 2 puffs about 12 hours later (unless doing great)   Only use your albuterol (xopenox)  as a rescue medication to be used if you can't catch your breath by resting or doing a relaxed purse lip breathing pattern.  - The less you use it, the better it will work when you need it. - Ok to use up to 2 puffs  every 4 hours if you must but call for immediate appointment if use goes up over your usual need - Don't leave home without it !!  (think of it like the spare tire for your car)    Please remember to go to the   x-ray department downstairs for your tests - we will call you with the results when they are available.  We can see you back as needed - see [name] NP if need help with organizing your medications/ new med calendar

## 2013-03-13 NOTE — Progress Notes (Signed)
Subjective:    Patient ID: [name] [name], female    DOB: 1950/10/30.   MRN: [id number]    Brief patient profile:  71 yowf last smoked 2003 with COPD with minimum asthmatic component with an FEV1 of 55%, a ratio of 53%, and a diffusing capacity 51% documented 07/25/06. On 02 24 hours per day at 2lpm  6/09 weighed 271 and it was felt she was as debilitated as much by obesity as by airflow obstruction.  baseline = when well = walk to mother in [place] but stops at least once because it's uphill and frequently requiring rescue rx.   History of Present Illness  March 19, 2010-- Post hospital visit. Admitted 2/28-03/16/10( dc wt at 269 which was about 10 lbs down) for Acute on chronic resp failure secondary to decompensated diastolic heart failure . She was tx with aggressive diuresis. Conintued on coumadin. INR yesterday was 2.4. 2 D echo showed EF 66% w/ gr 2 diastolic dysfunction . VQ scan with low probability for PE. Since discharge some better but still real weak. Cough and congestion are some better. Edema worse this am, took with lasix that helped. rec  no change in rx   01/17/12 followup and medication review Patient returns for a two-month followup and medication review. Reviewed all her medications and organized them into a medication calendar    It appears that she is taking her medications correctly Last visit. She was decreased to Symbicort 1 puff twice daily to help with possible tremor. This does seem to help somewhat with her tremor She feels that her breathing is not quite as good with increased shortness of breath on the decreased dose of Symbicort, however, she's had no flare of cough and wheezing rec  Begin Tudorza 1 puff Twice daily > better    02/18/2012 f/u ov/Latrice Storlie cc better until 2 days prior to OV  With congested cough, white mucus, not using hfa saba or neb saba yet, not optimizing action plan on med calendar.   rec Increase symbicort to 160 Take 2 puffs first thing in am  and then another 2 puffs about 12 hours later until better then back to one twice daily 02 is 2lpm 24/7 x outside the house walk on 3lpm pulsed  Work on inhaler technique   03/13/2013 f/u ov/Izzac Rockett re:  Chronic resp failure/ copd Chief Complaint  Patient presents with  . Follow-up    Breathing has been progressively worse. Using xopenex HFA approx 3 x per day and not using neb often- maybe once per month.   Not using symbicort but 1-2 every 12 hours and poor hfa noted   No obvious daytime variabilty to sob or assoc purulent sputum  Or  chest tightness, subjective wheeze overt sinus or hb symptoms. No unusual exp hx   Sleeping ok without nocturnal  or early am exacerbation  of respiratory  c/o's or need for noct saba. Also denies any obvious fluctuation of symptoms with weather or environmental changes or other aggravating or alleviating factors except as outlined above   ROS  The following are not active complaints unless bolded sore throat, dysphagia, dental problems, itching, sneezing,  nasal congestion or excess/ purulent secretions, ear ache,   fever, chills, sweats, unintended wt loss, pleuritic or exertional cp, hemoptysis,  orthopnea pnd or leg swelling, presyncope, palpitations, heartburn, abdominal pain, anorexia, nausea, vomiting, diarrhea  or change in bowel or urinary habits, change in stools or urine, dysuria,hematuria,  rash, arthralgias, visual complaints, headache, numbness  weakness or ataxia or problems with walking or coordination,  change in mood/affect or memory.      Past Medical History:  CHRONIC RHINITIS (ICD-472.0)  EDEMA (ICD-782.3)  ACUTE AND CHRONIC RESPIRATORY FAILURE (ICD-518.84)  MORBID OBESITY (ICD-278.01)  - Target wt = 179 for BMI < 30 peak wt 282  - Referred back to nutrition December 18, 2007  DEEP VENOUS THROMBOPHLEBITIS, HX OF (ICD-V12.52)  COPD (ICD-496)..........................................................Marland KitchenWert  - PFTs 07/25/06 FEV1 55% ratio 53, DLC0  51%  - HFA 50% December 16, 2009 > 75% March 27, 2010  DEPRESSION (ICD-311)  OSTEOPOROSIS (ICD-733.00) last BMD 4/08 -2.4, intolerant of bisphosphonates  HYPERTENSION (ICD-401.9)  Atrial fibrillation - paroxysmal  Anticoagulation therapy  Pulmonary embolism, hx of  neg stress myoview 10/07  Anxiety  glucose intolerance - on steroids  Hyperlipidemia  Complex medical regimen  - Med calendar done 05/01/2008, 08/26/2010 , 01/17/12          Objective:   Physical Exam  GEN: A/Ox3; pleasant , NAD, obese wf nad  Wt  274 09/01/10 >   09/29/2010  275 > 01/01/2011  280 > 04/29/2011  279 > 285 11/17/2011  > 288 02/18/2012 > 281 04/19/2012 > 03/13/2013  282   HEENT:  [place]/AT,  EACs-clear, TMs-wnl, NOSE-clear drainage, THROAT-clear, no lesions, no postnasal drip or exudate noted.   NECK:  Supple w/ fair ROM; no JVD; normal carotid impulses w/o bruits; no thyromegaly or nodules palpated; no lymphadenopathy.  RESP  Diminshed BS in bases  w/o, wheezes/ rales/ or rhonchi.no accessory muscle use, no dullness to percussion  CARD:  RRR, no m/r/g  , tr peripheral edema, pulses intact, no cyanosis or clubbing.  GI:   Soft & nt; nml bowel sounds; no organomegaly or masses detected.  Musco: Warm bil, no deformities or joint swelling noted.   Neuro: alert, no focal deficits noted, very min bilat resting tremor    Skin: Warm, no lesions or rashes    CXR   03/13/13 No active cardiopulmonary disease. .         Assessment & Plan:

## 2013-03-15 ENCOUNTER — Encounter: Payer: Self-pay | Admitting: Physician Assistant

## 2013-03-15 ENCOUNTER — Ambulatory Visit (INDEPENDENT_AMBULATORY_CARE_PROVIDER_SITE_OTHER): Payer: BC Managed Care – PPO | Admitting: Physician Assistant

## 2013-03-15 VITALS — BP 128/78 | HR 85 | Ht 62.5 in | Wt 283.0 lb

## 2013-03-15 DIAGNOSIS — J449 Chronic obstructive pulmonary disease, unspecified: Secondary | ICD-10-CM

## 2013-03-15 DIAGNOSIS — R609 Edema, unspecified: Secondary | ICD-10-CM

## 2013-03-15 DIAGNOSIS — I4891 Unspecified atrial fibrillation: Secondary | ICD-10-CM | POA: Diagnosis not present

## 2013-03-15 DIAGNOSIS — I10 Essential (primary) hypertension: Secondary | ICD-10-CM

## 2013-03-15 DIAGNOSIS — R079 Chest pain, unspecified: Secondary | ICD-10-CM

## 2013-03-15 LAB — BASIC METABOLIC PANEL
BUN: 14 mg/dL (ref 6–23)
CO2: 36 mEq/L — ABNORMAL HIGH (ref 19–32)
Calcium: 8.8 mg/dL (ref 8.4–10.5)
Chloride: 102 mEq/L (ref 96–112)
Creatinine, Ser: 0.8 mg/dL (ref 0.4–1.2)
GFR: 79.34 mL/min (ref >60.00)
Glucose, Bld: 78 mg/dL (ref 70–99)
Potassium: 3.8 mEq/L (ref 3.5–5.1)
Sodium: 142 mEq/L (ref 135–145)

## 2013-03-15 MED ORDER — DILTIAZEM HCL ER COATED BEADS 180 MG PO CP24: 180.0000 mg | ORAL_CAPSULE | Freq: Every day | ORAL | Status: DC

## 2013-03-15 MED ORDER — KLOR-CON M20 20 MEQ PO TBCR: 20.0000 meq | EXTENDED_RELEASE_TABLET | Freq: Two times a day (BID) | ORAL | Status: DC

## 2013-03-15 MED ORDER — PROPAFENONE HCL ER 325 MG PO CP12: 325.0000 mg | ORAL_CAPSULE | Freq: Two times a day (BID) | ORAL | Status: DC

## 2013-03-15 NOTE — Progress Notes (Signed)
[address] [place], [place]  [address] Phone: [phone] Fax:  [phone]  Date:  123456   ID:  [name] [name], DOB [id number], MRN [id number]  PCP:  Cathlean Cower, MD  Cardiologist:  Dr. Kirk Ruths     History of Present Illness: [name] [name] is a 63 y.o. female with a hx of AFib maintaining NSR on Propafenone, prior pulmonary embolism, chronic coumadin, diastolic dysfunction, morbid obesity, COPD, LE edema.  Cardiac cath in 2003 was normal.  Echo in 11/13 demonstrated normal LVF.  Last seen by Dr. Kirk Ruths in 04/2012.  She returns for follow up. She denies palpitations.  She does noted L chest heaviness that only last seconds and occurs at rest.  She does note pain in her teeth with this and assoc diaphoresis.  She has chronic NYHA 3 DOE.  She admits to some heaviness in her chest with exertion at times.  She denies orthopnea, PND.  She has dependent edema in her legs.  She denies syncope.    Recent Labs: 04/17/2012: ALT 19; Creatinine 0.8; HDL Cholesterol 39.50; Hemoglobin 11.1*; LDL (calc) 83; Potassium 3.8; TSH 3.83   Wt Readings from Last 3 Encounters:  03/15/13 283 lb (128.368 kg)  03/13/13 282 lb (127.914 kg)  01/17/13 273 lb (123.832 kg)     Past Medical History  Diagnosis Date  . Chronic rhinitis   . Morbid obesity     target wt=179lb for BMI<30 Peak wt 282lb  . History of DVT (deep vein thrombosis)   . COPD (chronic obstructive pulmonary disease)     Wert. PFTs 07/25/06 FEV1 55% ratio 53, DLC0 51% HFA 50% 12/16/2009  . Depression   . Osteoporosis     last BMD 4/08 -2.4, intolerant of bisphosphonates  . Hypertension   . Atrial fibrillation and flutter   . Anxiety   . Glucose intolerance (impaired glucose tolerance)     on steroids  . VITAMIN D DEFICIENCY   . HYPERLIPIDEMIA   . PREMATURE VENTRICULAR CONTRACTIONS   . Rhabdomyolysis 07/29/2009  . FRACTURE, RIB, RIGHT 06/18/2009  . PULMONARY EMBOLISM, HX OF 06/19/2007  . Tremor   . Hx of cardiac  catheterization     LHC (01/2001):  Normal Cors.  EF 65%.  Marland Kitchen Hx of echocardiogram     Echo (11/2011):  Mod LVH, EF 60-65%, no RWMA, MAC, mild LAE, PASP 34.    Current Outpatient Prescriptions  Medication Sig Dispense Refill  . acetaminophen (TYLENOL ARTHRITIS PAIN) 650 MG CR tablet Take as directed when needed for pain      . Aclidinium Bromide (TUDORZA PRESSAIR) 400 MCG/ACT AEPB Inhale 1 puff into the lungs 2 (two) times daily.  3 each  3  . budesonide-formoterol (SYMBICORT) 160-4.5 MCG/ACT inhaler Inhale 2 puffs into the lungs 2 (two) times daily.  3 Inhaler  1  . Calcium Carbonate-Vitamin D 600-400 MG-UNIT per tablet Take 1 tablet by mouth 2 (two) times daily.        . Cetirizine HCl (ZYRTEC ALLERGY) 10 MG CAPS Take 1 capsule (10 mg total) by mouth at bedtime.      Marland Kitchen dextromethorphan-guaiFENesin (MUCINEX DM) 30-600 MG per 12 hr tablet 1 every 12 hours as needed w/flutter for cough/congestion      . diltiazem (CARDIZEM CD) 180 MG 24 hr capsule Take 1 capsule (180 mg total) by mouth daily.  90 capsule  3  . FLUoxetine (PROZAC) 40 MG capsule TAKE 1 CAPSULE DAILY  90 capsule  1  . furosemide (LASIX) 80 MG tablet Take 1 tablet (80 mg total) by mouth 2 (two) times daily.  180 tablet  4  . glucosamine-chondroitin 500-400 MG tablet Take 1 tablet by mouth daily.        Marland Kitchen KLOR-CON M20 20 MEQ tablet Take 1 tablet (20 mEq total) by mouth 2 (two) times daily.  180 tablet  0  . levalbuterol (XOPENEX HFA) 45 MCG/ACT inhaler INHALE 2 PUFFS EVERY 4 HOURS AS NEEDED  3 Inhaler  0  . levalbuterol (XOPENEX) 1.25 MG/3ML nebulizer solution Take 1.25 mg by nebulization every 4 (four) hours as needed.  300 mL  3  . mometasone (NASONEX) 50 MCG/ACT nasal spray Place 1 spray into the nose 2 (two) times daily as needed.      . nitroGLYCERIN (NITROSTAT) 0.4 MG SL tablet Place 1 tablet (0.4 mg total) under the tongue every 5 (five) minutes as needed for chest pain.  25 tablet  3  . omeprazole (PRILOSEC) 20 MG capsule Take  1 capsule (20 mg total) by mouth daily.  90 capsule  3  . oxymetazoline (AFRIN) 0.05 % nasal spray Place 2 sprays into the nose 2 (two) times daily as needed for congestion. For 5 days before nasonex      . propafenone (RYTHMOL SR) 325 MG 12 hr capsule Take 1 capsule (325 mg total) by mouth 2 (two) times daily.  180 capsule  3  . sodium chloride (OCEAN) 0.65 % SOLN nasal spray 2 puffs every 4 hours as needed for nasal congestion      . traMADol (ULTRAM) 50 MG tablet Take 1 tablet (50 mg total) by mouth every 4 (four) hours as needed. For cough  60 tablet  1  . valsartan (DIOVAN) 320 MG tablet Take 1 tablet (320 mg total) by mouth daily.  90 tablet  3  . warfarin (COUMADIN) 7.5 MG tablet TAKE AS DIRECTED BY COUMADIN CLINIC  120 tablet  0   No current facility-administered medications for this visit.    Allergies:   Duloxetine and Penicillins   Social History:  The patient  reports that she quit smoking about 17 years ago. Her smoking use included Cigarettes. She has a 35 pack-year smoking history. She has never used smokeless tobacco. She reports that she does not drink alcohol or use illicit drugs.   Family History:  The patient's family history includes Asthma in her brother and son; Heart disease in her mother; Throat cancer in her brother.   ROS:  Please see the history of present illness.   She has a chronic cough.   All other systems reviewed and negative.   PHYSICAL EXAM: VS:  BP 128/78  Pulse 85  Ht 5' 2.5" (1.588 m)  Wt 283 lb (128.368 kg)  BMI 50.90 kg/m2 Well nourished, well developed, in no acute distress HEENT: normal Neck: no JVD Cardiac:  normal S1, S2; RRR; no murmur Lungs:  Decreased breath sounds bilaterally, no wheezing, rhonchi or rales Abd: soft, nontender, no hepatomegaly Ext: trace-1+ bilateral LE edema;  Significant amount of varicose veins noted to bilateral feet. Skin: warm and dry Neuro:  CNs 2-12 intact, no focal abnormalities noted  EKG:  NSR, HR 85,  normal axis, NSSTTW changes, QTc 442     ASSESSMENT AND PLAN:  1. Chest Pain:  Typical and atypical features.  She has risk factors for CAD.  Last evaluation for ischemia was in 2003.  LHC demonstrated no CAD at that time.  I will arrange a Lexiscan Myoview. 2. Atrial Fibrillation:  Maintaining NSR.  Continue Propafenone. Coumadin is managed by primary care.  3. Hypertension:  Controlled.  4. Edema:  Multifactorial and related to mild pulmonary HTN from COPD, venous insufficiency, deconditioning.  She likely has a component of HFpEF as well.  Continue current dose of lasix.  5. COPD:  Continue O2.  F/u with pulmonary as planned. 6. Disposition:  F/u with Dr. Kirk Ruths in 6 mos or sooner if Myoview is abnormal.   Signed, Richardson Dopp, PA-C  03/15/2013 12:04 PM

## 2013-03-15 NOTE — Assessment & Plan Note (Signed)
-   09/29/10  Walked 3lpm  2 laps @ 185 ft each stopped due to  Sob with sats 90%      - 11-17-11--o2 sat on ra at rest 83%ra     - 02/18/2012  Sat 88% RA across the room > Walked 3lpm x 3 laps @ 185 ft each stopped due to  End of study no desat     - 04/19/2012  Walked 3lpm x 3 laps @ 185 ft each stopped due to  End of study, sats dropped to 88 at very end      - HC03  35  04/17/12   rx is 2lpm at rest/ sleep/ 3lpm with ex as of 03/13/13

## 2013-03-15 NOTE — Assessment & Plan Note (Signed)
-   PFTs 07/25/06 FEV1 55% ratio 53, DLC0 51%  - PFT's 04/19/2012 FEV1 50% (ratio 51) with 19% improvement p B2, dlco 42 > 99% corrected  - med calendar 01/17/2012  -Tudorza trial  01/17/12 > improved    DDX of  difficult airways managment all start with A and  include Adherence, Ace Inhibitors, Acid Reflux, Active Sinus Disease, Alpha 1 Antitripsin deficiency, Anxiety masquerading as Airways dz,  ABPA,  allergy(esp in young), Aspiration (esp in elderly), Adverse effects of DPI,  Active smokers, plus two Bs  = Bronchiectasis and Beta blocker use..and one C= CHF  Adherence is always the initial "prime suspect" and is a multilayered concern that requires a "trust but verify" approach in every patient - starting with knowing how to use medications, especially inhalers, correctly, keeping up with refills and understanding the fundamental difference between maintenance and prns vs those medications only taken for a very short course and then stopped and not refilled.  - The proper method of use, as well as anticipated side effects, of a metered-dose inhaler are discussed and demonstrated to the patient. Improved effectiveness after extensive coaching during this visit to a level of approximately  75%   Failing to use symbicort consistently/ effectively > See instructions for specific recommendations which were reviewed directly with the patient who was given a copy with highlighter outlining the key components.

## 2013-03-15 NOTE — Patient Instructions (Signed)
REFILLS HAVE BEEN SENT IN TO [facility] TODAY FOR DILTIAZEM, POTASSIUM AND RYTHMOL  LAB WORK TODAY; BMET  Your physician has requested that you have a lexiscan myoview. For further information please visit [email or web address]. Please follow instruction sheet, as given.  Your physician wants you to follow-up in: [place] DR. CRENSHAW. You will receive a reminder letter in the mail two months in advance. If you don't receive a letter, please call our office to schedule the follow-up appointment.

## 2013-03-16 NOTE — Progress Notes (Signed)
Quick Note:  Spoke with pt and notified of results per Dr. Wert. Pt verbalized understanding and denied any questions.  ______ 

## 2013-03-21 ENCOUNTER — Ambulatory Visit (INDEPENDENT_AMBULATORY_CARE_PROVIDER_SITE_OTHER): Payer: BC Managed Care – PPO | Admitting: General Practice

## 2013-03-21 DIAGNOSIS — Z7901 Long term (current) use of anticoagulants: Secondary | ICD-10-CM

## 2013-03-21 LAB — POCT INR: INR: 1.7

## 2013-03-21 NOTE — Progress Notes (Signed)
Pre visit review using our clinic review tool, if applicable. No additional management support is needed unless otherwise documented below in the visit note. 

## 2013-03-28 DIAGNOSIS — Z0000 Encounter for general adult medical examination without abnormal findings: Secondary | ICD-10-CM | POA: Diagnosis not present

## 2013-03-28 DIAGNOSIS — R82998 Other abnormal findings in urine: Secondary | ICD-10-CM | POA: Diagnosis not present

## 2013-03-28 DIAGNOSIS — E669 Obesity, unspecified: Secondary | ICD-10-CM | POA: Diagnosis not present

## 2013-03-28 DIAGNOSIS — Z01419 Encounter for gynecological examination (general) (routine) without abnormal findings: Secondary | ICD-10-CM | POA: Diagnosis not present

## 2013-03-29 ENCOUNTER — Encounter: Payer: Self-pay | Admitting: Cardiology

## 2013-03-29 ENCOUNTER — Ambulatory Visit (HOSPITAL_COMMUNITY): Payer: BC Managed Care – PPO | Attending: Cardiology | Admitting: Radiology

## 2013-03-29 VITALS — BP 141/66 | Ht 62.5 in | Wt 284.0 lb

## 2013-03-29 DIAGNOSIS — R079 Chest pain, unspecified: Secondary | ICD-10-CM | POA: Diagnosis not present

## 2013-03-29 DIAGNOSIS — I4891 Unspecified atrial fibrillation: Secondary | ICD-10-CM

## 2013-03-29 MED ORDER — TECHNETIUM TC 99M SESTAMIBI GENERIC - CARDIOLITE
33.0000 | Freq: Once | INTRAVENOUS | Status: AC | PRN
  Administered 2013-03-29: 33 via INTRAVENOUS

## 2013-03-29 MED ORDER — REGADENOSON 0.4 MG/5ML IV SOLN
0.4000 mg | Freq: Once | INTRAVENOUS | Status: AC
  Administered 2013-03-29: 0.4 mg via INTRAVENOUS

## 2013-03-29 NOTE — Progress Notes (Signed)
[facility] [address] [place], [place] [address] [phone]    Cardiology Nuclear Med Study  [name] [name] is a 63 y.o. female     MRN : [id number]     DOB: 03/19/1950  Procedure Date: 03/29/2013  Nuclear Med Background Indication for Stress Test:  Evaluation for Ischemia History:Cath (2003 nml)Afib; Echo 11/16/11 EF:60-65%;Emphysema;COPD Cardiac Risk Factors: Family History - CAD, History of Smoking, Hypertension and Lipids  Symptoms:  Chest Pain (<1 month)   Nuclear Pre-Procedure Caffeine/Decaff Intake:  None NPO After: 7:30pm   Lungs:  clear O2 Sat: 97% on room air. IV 0.9% NS with Angio Cath:  20g  IV Site: R Antecubital  IV Started by:  Ileene Hutchinson, EMT-P  Chest Size (in):  44 Cup Size: D  Height: 5' 2.5" (1.588 m)  Weight:  284 lb (128.822 kg)  BMI:  Body mass index is 51.08 kg/(m^2). Tech Comments:  Used inhaler prior to Designer, multimedia Med Study 1 or 2 day study: 2 day  Stress Test Type:  Carlton Adam  Reading MD: n/a  Order Authorizing Provider:  B.Crenshaw MD  Resting Radionuclide: Technetium 82m Sestamibi  Resting Radionuclide Dose: 33.0 mCi on 04/05/13   Stress Radionuclide:  Technetium 13m Sestamibi  Stress Radionuclide Dose: 33.0 mCi on 03/29/13           Stress Protocol Rest HR: 63 Stress HR: 89  Rest BP: 141/66 Stress BP: 89  Exercise Time (min): n/a METS: n/a   Predicted Max HR: 158 bpm % Max HR: 56.33 bpm Rate Pressure Product: 12638   Dose of Adenosine (mg):  n/a Dose of Lexiscan: 0.4 mg  Dose of Atropine (mg): n/a Dose of Dobutamine: n/a mcg/kg/min (at max HR)  Stress Test Technologist: Ileene Hutchinson, EMT-P  Nuclear Technologist:  Annye Rusk, CNMT     Rest Procedure:  Myocardial perfusion imaging was performed at rest 45 minutes following the intravenous administration of Technetium 49m Sestamibi. Rest ECG: NSR with non-specific ST-T wave changes  Stress Procedure:  The patient received IV Lexiscan 0.4 mg  over 15-seconds.  Technetium 87m Sestamibi injected at 30-seconds.  Quantitative spect images were obtained after a 45 minute delay. Stress ECG: No significant change from baseline ECG  QPS Raw Data Images:  Normal; no motion artifact; normal heart/lung ratio. Stress Images:  Normal homogeneous uptake in all areas of the myocardium. Rest Images:  Normal homogeneous uptake in all areas of the myocardium. Subtraction (SDS):  No evidence of ischemia. Transient Ischemic Dilatation (Normal <1.22):  0.99 Lung/Heart Ratio (Normal <0.45):  0.29  Quantitative Gated Spect Images QGS EDV:  122 ml QGS ESV:  48 ml  Impression Exercise Capacity:  Lexiscan with no exercise. BP Response:  Normal blood pressure response. Clinical Symptoms:  No significant symptoms noted. ECG Impression:  No significant ST segment change suggestive of ischemia. Comparison with Prior Nuclear Study: No previous nuclear study performed  Overall Impression:  Normal stress nuclear study.  LV Ejection Fraction: 61%.  LV Wall Motion:  NL LV Function; NL Wall Motion   [employer]

## 2013-04-02 ENCOUNTER — Other Ambulatory Visit: Payer: Self-pay | Admitting: Internal Medicine

## 2013-04-04 ENCOUNTER — Encounter: Payer: Self-pay | Admitting: Cardiology

## 2013-04-05 ENCOUNTER — Ambulatory Visit (HOSPITAL_COMMUNITY): Payer: BC Managed Care – PPO | Attending: Cardiology

## 2013-04-05 DIAGNOSIS — R0989 Other specified symptoms and signs involving the circulatory and respiratory systems: Secondary | ICD-10-CM

## 2013-04-05 MED ORDER — TECHNETIUM TC 99M SESTAMIBI GENERIC - CARDIOLITE
30.0000 | Freq: Once | INTRAVENOUS | Status: AC | PRN
  Administered 2013-04-05: 30 via INTRAVENOUS

## 2013-04-06 ENCOUNTER — Encounter: Payer: Self-pay | Admitting: Physician Assistant

## 2013-04-18 ENCOUNTER — Ambulatory Visit (INDEPENDENT_AMBULATORY_CARE_PROVIDER_SITE_OTHER): Payer: BC Managed Care – PPO | Admitting: General Practice

## 2013-04-18 DIAGNOSIS — Z86718 Personal history of other venous thrombosis and embolism: Secondary | ICD-10-CM

## 2013-04-18 DIAGNOSIS — I4891 Unspecified atrial fibrillation: Secondary | ICD-10-CM | POA: Diagnosis not present

## 2013-04-18 DIAGNOSIS — Z5181 Encounter for therapeutic drug level monitoring: Secondary | ICD-10-CM

## 2013-04-18 DIAGNOSIS — I4892 Unspecified atrial flutter: Secondary | ICD-10-CM | POA: Diagnosis not present

## 2013-04-18 DIAGNOSIS — Z8672 Personal history of thrombophlebitis: Secondary | ICD-10-CM

## 2013-04-18 DIAGNOSIS — Z7901 Long term (current) use of anticoagulants: Secondary | ICD-10-CM

## 2013-04-18 LAB — POCT INR: INR: 1.6

## 2013-04-18 NOTE — Progress Notes (Signed)
Pre visit review using our clinic review tool, if applicable. No additional management support is needed unless otherwise documented below in the visit note. 

## 2013-05-09 ENCOUNTER — Ambulatory Visit (INDEPENDENT_AMBULATORY_CARE_PROVIDER_SITE_OTHER): Payer: BC Managed Care – PPO | Admitting: General Practice

## 2013-05-09 DIAGNOSIS — Z8672 Personal history of thrombophlebitis: Secondary | ICD-10-CM

## 2013-05-09 DIAGNOSIS — I4891 Unspecified atrial fibrillation: Secondary | ICD-10-CM | POA: Diagnosis not present

## 2013-05-09 DIAGNOSIS — Z5181 Encounter for therapeutic drug level monitoring: Secondary | ICD-10-CM

## 2013-05-09 DIAGNOSIS — I4892 Unspecified atrial flutter: Secondary | ICD-10-CM | POA: Diagnosis not present

## 2013-05-09 DIAGNOSIS — Z86718 Personal history of other venous thrombosis and embolism: Secondary | ICD-10-CM | POA: Diagnosis not present

## 2013-05-09 DIAGNOSIS — Z7901 Long term (current) use of anticoagulants: Secondary | ICD-10-CM

## 2013-05-09 LAB — POCT INR: INR: 2.2

## 2013-05-09 NOTE — Progress Notes (Signed)
Pre visit review using our clinic review tool, if applicable. No additional management support is needed unless otherwise documented below in the visit note. 

## 2013-05-16 ENCOUNTER — Other Ambulatory Visit: Payer: Self-pay | Admitting: Cardiology

## 2013-05-31 ENCOUNTER — Telehealth: Payer: Self-pay | Admitting: *Deleted

## 2013-05-31 MED ORDER — ATORVASTATIN CALCIUM 10 MG PO TABS: 10.0000 mg | ORAL_TABLET | Freq: Every day | ORAL | Status: DC

## 2013-05-31 NOTE — Telephone Encounter (Signed)
Pt called requesting Atorvastatin refill however, Rx not listed on med list.  Please advise

## 2013-05-31 NOTE — Telephone Encounter (Signed)
Done erx 

## 2013-06-06 ENCOUNTER — Ambulatory Visit (INDEPENDENT_AMBULATORY_CARE_PROVIDER_SITE_OTHER): Payer: BC Managed Care – PPO | Admitting: Family Medicine

## 2013-06-06 DIAGNOSIS — Z7901 Long term (current) use of anticoagulants: Secondary | ICD-10-CM

## 2013-06-06 DIAGNOSIS — I4892 Unspecified atrial flutter: Secondary | ICD-10-CM | POA: Diagnosis not present

## 2013-06-06 DIAGNOSIS — Z8672 Personal history of thrombophlebitis: Secondary | ICD-10-CM | POA: Diagnosis not present

## 2013-06-06 DIAGNOSIS — Z86718 Personal history of other venous thrombosis and embolism: Secondary | ICD-10-CM | POA: Diagnosis not present

## 2013-06-06 DIAGNOSIS — I4891 Unspecified atrial fibrillation: Secondary | ICD-10-CM

## 2013-06-06 DIAGNOSIS — Z5181 Encounter for therapeutic drug level monitoring: Secondary | ICD-10-CM

## 2013-06-06 LAB — POCT INR: INR: 2.9

## 2013-06-09 ENCOUNTER — Other Ambulatory Visit: Payer: Self-pay | Admitting: Internal Medicine

## 2013-06-20 ENCOUNTER — Encounter (HOSPITAL_COMMUNITY): Payer: Self-pay | Admitting: Emergency Medicine

## 2013-06-20 ENCOUNTER — Emergency Department (HOSPITAL_COMMUNITY): Payer: BC Managed Care – PPO

## 2013-06-20 ENCOUNTER — Encounter: Payer: BC Managed Care – PPO | Admitting: Adult Health

## 2013-06-20 ENCOUNTER — Emergency Department (HOSPITAL_COMMUNITY)
Admission: EM | Admit: 2013-06-20 | Discharge: 2013-06-20 | Disposition: A | Discharge status: Home / Self Care | Payer: BC Managed Care – PPO | Attending: Emergency Medicine | Admitting: Emergency Medicine

## 2013-06-20 DIAGNOSIS — J4489 Other specified chronic obstructive pulmonary disease: Secondary | ICD-10-CM | POA: Diagnosis not present

## 2013-06-20 DIAGNOSIS — R1084 Generalized abdominal pain: Secondary | ICD-10-CM | POA: Diagnosis not present

## 2013-06-20 DIAGNOSIS — Z86711 Personal history of pulmonary embolism: Secondary | ICD-10-CM | POA: Diagnosis not present

## 2013-06-20 DIAGNOSIS — Z9889 Other specified postprocedural states: Secondary | ICD-10-CM | POA: Diagnosis not present

## 2013-06-20 DIAGNOSIS — J449 Chronic obstructive pulmonary disease, unspecified: Secondary | ICD-10-CM | POA: Insufficient documentation

## 2013-06-20 DIAGNOSIS — I4949 Other premature depolarization: Secondary | ICD-10-CM | POA: Insufficient documentation

## 2013-06-20 DIAGNOSIS — Z86718 Personal history of other venous thrombosis and embolism: Secondary | ICD-10-CM | POA: Insufficient documentation

## 2013-06-20 DIAGNOSIS — F411 Generalized anxiety disorder: Secondary | ICD-10-CM | POA: Diagnosis not present

## 2013-06-20 DIAGNOSIS — I4891 Unspecified atrial fibrillation: Secondary | ICD-10-CM | POA: Insufficient documentation

## 2013-06-20 DIAGNOSIS — E785 Hyperlipidemia, unspecified: Secondary | ICD-10-CM | POA: Insufficient documentation

## 2013-06-20 DIAGNOSIS — Z87891 Personal history of nicotine dependence: Secondary | ICD-10-CM | POA: Diagnosis not present

## 2013-06-20 DIAGNOSIS — Z8639 Personal history of other endocrine, nutritional and metabolic disease: Secondary | ICD-10-CM | POA: Diagnosis not present

## 2013-06-20 DIAGNOSIS — R109 Unspecified abdominal pain: Secondary | ICD-10-CM

## 2013-06-20 DIAGNOSIS — F329 Major depressive disorder, single episode, unspecified: Secondary | ICD-10-CM | POA: Insufficient documentation

## 2013-06-20 DIAGNOSIS — N390 Urinary tract infection, site not specified: Secondary | ICD-10-CM | POA: Diagnosis not present

## 2013-06-20 DIAGNOSIS — Z862 Personal history of diseases of the blood and blood-forming organs and certain disorders involving the immune mechanism: Secondary | ICD-10-CM | POA: Insufficient documentation

## 2013-06-20 DIAGNOSIS — Z79899 Other long term (current) drug therapy: Secondary | ICD-10-CM | POA: Insufficient documentation

## 2013-06-20 DIAGNOSIS — F3289 Other specified depressive episodes: Secondary | ICD-10-CM | POA: Insufficient documentation

## 2013-06-20 DIAGNOSIS — I10 Essential (primary) hypertension: Secondary | ICD-10-CM | POA: Diagnosis not present

## 2013-06-20 DIAGNOSIS — K5900 Constipation, unspecified: Secondary | ICD-10-CM | POA: Diagnosis not present

## 2013-06-20 LAB — URINALYSIS, ROUTINE W REFLEX MICROSCOPIC
Glucose, UA: NEGATIVE mg/dL
Hgb urine dipstick: NEGATIVE
Ketones, ur: NEGATIVE mg/dL
Nitrite: NEGATIVE
Protein, ur: NEGATIVE mg/dL
Specific Gravity, Urine: 1.023 (ref 1.005–1.030)
Urobilinogen, UA: 1 mg/dL (ref 0.0–1.0)
pH: 6 (ref 5.0–8.0)

## 2013-06-20 LAB — COMPREHENSIVE METABOLIC PANEL
ALT: 14 U/L (ref 0–35)
AST: 16 U/L (ref 0–37)
Albumin: 3.8 g/dL (ref 3.5–5.2)
Alkaline Phosphatase: 118 U/L — ABNORMAL HIGH (ref 39–117)
BUN: 10 mg/dL (ref 6–23)
CO2: 34 mEq/L — ABNORMAL HIGH (ref 19–32)
Calcium: 8.9 mg/dL (ref 8.4–10.5)
Chloride: 102 mEq/L (ref 96–112)
Creatinine, Ser: 0.73 mg/dL (ref 0.50–1.10)
GFR calc Af Amer: >90 mL/min (ref >90)
GFR calc non Af Amer: 89 mL/min — ABNORMAL LOW (ref >90)
Glucose, Bld: 168 mg/dL — ABNORMAL HIGH (ref 70–99)
Potassium: 3.7 mEq/L (ref 3.7–5.3)
Sodium: 147 mEq/L (ref 137–147)
Total Bilirubin: 0.8 mg/dL (ref 0.3–1.2)
Total Protein: 7.3 g/dL (ref 6.0–8.3)

## 2013-06-20 LAB — CBC WITH DIFFERENTIAL/PLATELET
Basophils Absolute: 0 10*3/uL (ref 0.0–0.1)
Basophils Relative: 0 % (ref 0–1)
Eosinophils Absolute: 0.1 10*3/uL (ref 0.0–0.7)
Eosinophils Relative: 2 % (ref 0–5)
HCT: 34 % — ABNORMAL LOW (ref 36.0–46.0)
Hemoglobin: 10.9 g/dL — ABNORMAL LOW (ref 12.0–15.0)
Lymphocytes Relative: 29 % (ref 12–46)
Lymphs Abs: 1.8 10*3/uL (ref 0.7–4.0)
MCH: 27.7 pg (ref 26.0–34.0)
MCHC: 32.1 g/dL (ref 30.0–36.0)
MCV: 86.3 fL (ref 78.0–100.0)
Monocytes Absolute: 0.5 10*3/uL (ref 0.1–1.0)
Monocytes Relative: 9 % (ref 3–12)
Neutro Abs: 3.7 10*3/uL (ref 1.7–7.7)
Neutrophils Relative %: 60 % (ref 43–77)
Platelets: 237 10*3/uL (ref 150–400)
RBC: 3.94 MIL/uL (ref 3.87–5.11)
RDW: 16.8 % — ABNORMAL HIGH (ref 11.5–15.5)
WBC: 6.2 10*3/uL (ref 4.0–10.5)

## 2013-06-20 LAB — I-STAT CG4 LACTIC ACID, ED: Lactic Acid, Venous: 2.18 mmol/L (ref 0.5–2.2)

## 2013-06-20 LAB — URINE MICROSCOPIC-ADD ON

## 2013-06-20 LAB — LIPASE, BLOOD: Lipase: 32 U/L (ref 11–59)

## 2013-06-20 LAB — PROTIME-INR
INR: 1.84 — ABNORMAL HIGH (ref 0.00–1.49)
Prothrombin Time: 20.7 seconds — ABNORMAL HIGH (ref 11.6–15.2)

## 2013-06-20 MED ORDER — IOHEXOL 300 MG/ML  SOLN
50.0000 mL | Freq: Once | INTRAMUSCULAR | Status: AC | PRN
  Administered 2013-06-20: 50 mL via ORAL

## 2013-06-20 MED ORDER — CEPHALEXIN 500 MG PO CAPS: 500.0000 mg | ORAL_CAPSULE | Freq: Four times a day (QID) | ORAL | Status: DC

## 2013-06-20 MED ORDER — HYDROCODONE-ACETAMINOPHEN 5-325 MG PO TABS: 1.0000 | ORAL_TABLET | Freq: Four times a day (QID) | ORAL | Status: DC | PRN

## 2013-06-20 MED ORDER — MAGNESIUM CITRATE PO SOLN
1.0000 | Freq: Once | ORAL | Status: AC
  Administered 2013-06-20: 1 via ORAL
  Filled 2013-06-20: qty 296

## 2013-06-20 MED ORDER — HYDROMORPHONE HCL PF 1 MG/ML IJ SOLN
1.0000 mg | Freq: Once | INTRAMUSCULAR | Status: AC
  Administered 2013-06-20: 1 mg via INTRAVENOUS
  Filled 2013-06-20: qty 1

## 2013-06-20 MED ORDER — FENTANYL CITRATE 0.05 MG/ML IJ SOLN
100.0000 ug | Freq: Once | INTRAMUSCULAR | Status: AC
  Administered 2013-06-20: 100 ug via INTRAVENOUS
  Filled 2013-06-20: qty 2

## 2013-06-20 MED ORDER — ONDANSETRON HCL 4 MG/2ML IJ SOLN
4.0000 mg | Freq: Once | INTRAMUSCULAR | Status: AC
  Administered 2013-06-20: 4 mg via INTRAVENOUS
  Filled 2013-06-20: qty 2

## 2013-06-20 MED ORDER — DOCUSATE SODIUM 100 MG PO CAPS: 100.0000 mg | ORAL_CAPSULE | Freq: Two times a day (BID) | ORAL | Status: DC

## 2013-06-20 MED ORDER — IOHEXOL 300 MG/ML  SOLN
100.0000 mL | Freq: Once | INTRAMUSCULAR | Status: AC | PRN
  Administered 2013-06-20: 100 mL via INTRAVENOUS

## 2013-06-20 NOTE — ED Notes (Signed)
Patient c/o lower abd pain, states she feels gassy. Patient states pain has been ongoing for about 2 days but intensified overnight.

## 2013-06-20 NOTE — ED Provider Notes (Signed)
CSN: [id number]     Arrival date & time 06/20/13  0540 History   First MD Initiated Contact with Patient 06/20/13 [phone]     Chief Complaint  Patient presents with  . Abdominal Pain    lower     (Consider location/radiation/quality/duration/timing/severity/associated sxs/prior Treatment) HPI  63 year old female with a past medical history significant for morbid obesity, COPD, on home oxygen at 3 L, hypertension, A. fib, on chronic Coumadin previous surgical history of abdominal hysterectomy presents to the emergency department chief complaint of abdominal pain. Onset 2 days ago with progressively worsening severe pain that intensified last night. She describes the pain as constant, pressure-like, worse in the bilateral lower quadrants, sensations of "gassiness." She has been making normal bowel movements denies a history of constipation or chronic opiate use. She has some mild associated nausea. She has had a previous colonoscopy without abnormal result. Patient denies any melena or hematochezia. She denies any diarrhea.  She denies any urinary symptoms or vaginal symptoms. Denies fevers, chills, myalgias, arthralgias. Denies DOE, SOB, chest tightness or pressure, radiation to left arm, jaw or back, or diaphoresis.  Denies headaches, light headedness, weakness, visual disturbances.   Past Medical History  Diagnosis Date  . Chronic rhinitis   . Morbid obesity     target wt=179lb for BMI<30 Peak wt 282lb  . History of DVT (deep vein thrombosis)   . COPD (chronic obstructive pulmonary disease)     [place]. PFTs 07/25/06 FEV1 55% ratio 53, DLC0 51% HFA 50% 12/16/2009  . Depression   . Osteoporosis     last BMD 4/08 -2.4, intolerant of bisphosphonates  . Hypertension   . Atrial fibrillation and flutter   . Anxiety   . Glucose intolerance (impaired glucose tolerance)     on steroids  . VITAMIN D DEFICIENCY   . HYPERLIPIDEMIA   . PREMATURE VENTRICULAR CONTRACTIONS   . Rhabdomyolysis 07/29/2009   . FRACTURE, RIB, RIGHT 06/18/2009  . PULMONARY EMBOLISM, HX OF 06/19/2007  . Tremor   . Hx of cardiac catheterization     LHC (01/2001):  Normal Cors.  EF 65%.;  LexiScan Myoview (03/2013): No ischemia, EF 61%, normal  . Hx of echocardiogram     Echo (11/2011):  Mod LVH, EF 60-65%, no RWMA, MAC, mild LAE, PASP 34.   Past Surgical History  Procedure Laterality Date  . Abdominal hysterectomy    . Tonsillectomy    . Skin graft to middle r finger  1975  . S/p left arm fracture with fall off chair     Family History  Problem Relation Age of Onset  . Heart disease Mother   . Asthma Brother   . Throat cancer Brother   . Asthma Son    History  Substance Use Topics  . Smoking status: Former Smoker -- 1.00 packs/day for 35 years    Types: Cigarettes    Quit date: 01/12/1996  . Smokeless tobacco: Never Used  . Alcohol Use: No   OB History   Grav Para Term Preterm Abortions TAB SAB Ect Mult Living                 Review of Systems   Ten systems reviewed and are negative for acute change, except as noted in the HPI.   Allergies  Duloxetine and Penicillins  Home Medications   Prior to Admission medications   Medication Sig Start Date End Date Taking? Authorizing Provider  acetaminophen (TYLENOL ARTHRITIS PAIN) 650 MG CR tablet Take as directed  when needed for pain 08/26/10   [name] S Parrett, NP  Aclidinium Bromide (TUDORZA PRESSAIR) 400 MCG/ACT AEPB Inhale 1 puff into the lungs 2 (two) times daily. 01/28/12   Tanda Rockers, MD  atorvastatin (LIPITOR) 10 MG tablet Take 1 tablet (10 mg total) by mouth daily. 05/31/13   Biagio Borg, MD  budesonide-formoterol [facility]) 160-4.5 MCG/ACT inhaler Inhale 2 puffs into the lungs 2 (two) times daily. 11/06/12   Tanda Rockers, MD  Calcium Carbonate-Vitamin D 600-400 MG-UNIT per tablet Take 1 tablet by mouth 2 (two) times daily.      Historical Provider, MD  Cetirizine HCl (ZYRTEC ALLERGY) 10 MG CAPS Take 1 capsule (10 mg total) by mouth at  bedtime. 08/26/10   [name] S Parrett, NP  dextromethorphan-guaiFENesin (MUCINEX DM) 30-600 MG per 12 hr tablet 1 every 12 hours as needed w/flutter for cough/congestion 08/26/10   [name] S Parrett, NP  diltiazem (CARDIZEM CD) 180 MG 24 hr capsule Take 1 capsule (180 mg total) by mouth daily. 03/15/13   Liliane Shi, PA-C  FLUoxetine (PROZAC) 40 MG capsule TAKE 1 CAPSULE DAILY    Biagio Borg, MD  furosemide (LASIX) 80 MG tablet TAKE 1 TABLET TWICE A DAY 05/16/13   Lelon Perla, MD  glucosamine-chondroitin 500-400 MG tablet Take 1 tablet by mouth daily.      Historical Provider, MD  KLOR-CON M20 20 MEQ tablet Take 1 tablet (20 mEq total) by mouth 2 (two) times daily. 03/15/13   Liliane Shi, PA-C  levalbuterol [facility] HFA) 45 MCG/ACT inhaler INHALE 2 PUFFS EVERY 4 HOURS AS NEEDED 03/05/13   Tanda Rockers, MD  levalbuterol Penne Lash) 1.25 MG/3ML nebulizer solution Take 1.25 mg by nebulization every 4 (four) hours as needed. 01/15/13   Tanda Rockers, MD  mometasone (NASONEX) 50 MCG/ACT nasal spray Place 1 spray into the nose 2 (two) times daily as needed. 08/26/10   [name] S Parrett, NP  nitroGLYCERIN (NITROSTAT) 0.4 MG SL tablet Place 1 tablet (0.4 mg total) under the tongue every 5 (five) minutes as needed for chest pain. 12/21/12 03/29/14  Minna Merritts, MD  omeprazole (PRILOSEC) 20 MG capsule Take 1 capsule (20 mg total) by mouth daily. 09/01/12 09/01/13  Biagio Borg, MD  oxymetazoline (AFRIN) 0.05 % nasal spray Place 2 sprays into the nose 2 (two) times daily as needed for congestion. For 5 days before nasonex 08/26/10   [name] S Parrett, NP  propafenone (RYTHMOL SR) 325 MG 12 hr capsule Take 1 capsule (325 mg total) by mouth 2 (two) times daily. 03/15/13   Liliane Shi, PA-C  sodium chloride (OCEAN) 0.65 % SOLN nasal spray 2 puffs every 4 hours as needed for nasal congestion 08/26/10   [name] S Parrett, NP  traMADol (ULTRAM) 50 MG tablet Take 1 tablet (50 mg total) by mouth every 4 (four) hours as needed.  For cough 12/21/12   Biagio Borg, MD  valsartan (DIOVAN) 320 MG tablet Take 1 tablet (320 mg total) by mouth daily. 10/17/12   Biagio Borg, MD  warfarin (COUMADIN) 7.5 MG tablet TAKE AS DIRECTED BY COUMADIN CLINIC 04/02/13   Biagio Borg, MD   BP 159/72  Pulse 76  Temp(Src) 97.7 F (36.5 C) (Oral)  Resp 20  Ht 5' 4.5" (1.638 m)  Wt 274 lb (124.286 kg)  BMI 46.32 kg/m2  SpO2 100% Physical Exam  Nursing note and vitals reviewed. Constitutional: She is oriented to person, place, and  time. No distress.  Markedly obese female who appears very uncomfortable.  HENT:  Head: Normocephalic and atraumatic.  Eyes: Conjunctivae are normal. No scleral icterus.  Neck: Normal range of motion.  Cardiovascular: Regular rhythm.  Exam reveals no gallop and no friction rub.   Murmur heard. Pulmonary/Chest: Effort normal and breath sounds normal. No respiratory distress.  02 sat 100% on 3l via nasal cannula. Chronic cough and slight wheeze  Abdominal: Soft. She exhibits no mass. There is tenderness. There is no rebound and no guarding.  Soft, obese abdomen. Difficult to auscultate bowel sounds. She is diffuse abdominal tenderness which is more prominent in the right upper quadrant and bilateral lower quadrants. Exam is limited due to body habitus  Musculoskeletal:  Trace pitting edema in bilateral lower extremity  Neurological: She is alert and oriented to person, place, and time.  Skin: Skin is warm and dry.    ED Course  Procedures (including critical care time) Labs Review Labs Reviewed  CBC WITH DIFFERENTIAL - Abnormal; Notable for the following:    Hemoglobin 10.9 (*)    HCT 34.0 (*)    RDW 16.8 (*)    All other components within normal limits  COMPREHENSIVE METABOLIC PANEL - Abnormal; Notable for the following:    CO2 34 (*)    Glucose, Bld 168 (*)    Alkaline Phosphatase 118 (*)    GFR calc non Af Amer 89 (*)    All other components within normal limits  PROTIME-INR - Abnormal;  Notable for the following:    Prothrombin Time 20.7 (*)    INR 1.84 (*)    All other components within normal limits  LIPASE, BLOOD  URINALYSIS, ROUTINE W REFLEX MICROSCOPIC  I-STAT CG4 LACTIC ACID, ED    Imaging Review No results found.   EKG Interpretation None      MDM   Final diagnoses:  Abdominal pain  Constipation  UTI (lower urinary tract infection)    7:29 AM BP 159/72  Pulse 76  Temp(Src) 97.7 F (36.5 C) (Oral)  Resp 20  Ht 5' 4.5" (1.638 m)  Wt 274 lb (124.286 kg)  BMI 46.32 kg/m2  SpO2 100% The patient with mild hypertension, afebrile. She has kind of diffuse abdominal tenderness worsening over past 2 days. Differential includes diverticulitis, constipation, gas, less likely ischemic bowel, biliary colic, appendicitis. She has mild leukocytosis, chronic hypercarbia. Slightly subtherapeutic on INR. Acute abdominal x-ray is unremarkable. Pain medication initiated and I will obtain CT abdomen   .10:45 AM CT without acute abnormality. She does have a urinary tract infection on urinalysis. UA sent for culture. I suspect some constipation and she does have a moderate stool burden on her plain films. Patient given a dose of magnesium citrate here. She was discharged with Keflex, pain medication and Colace. Followup with primary care physician.  Patient / Family / Caregiver informed of clinical course, understand medical decision-making process, and agree with plan.   I personally reviewed the imaging tests through PACS system. I have reviewed and interpreted Lab values. I reviewed available ER/hospitalization records through the [facility], PA-C 06/20/13 1046

## 2013-06-20 NOTE — ED Notes (Signed)
Pt unable to void at this time. 

## 2013-06-20 NOTE — ED Notes (Signed)
Pt O2 dropped to 75 upon returning from restroom

## 2013-06-20 NOTE — Discharge Instructions (Signed)
Abdominal (belly) pain can be caused by many things. Your caregiver performed an examination and possibly ordered blood/urine tests and imaging (CT scan, x-rays, ultrasound). Many cases can be observed and treated at home after initial evaluation in the emergency department. Even though you are being discharged home, abdominal pain can be unpredictable. Therefore, you need a repeated exam if your pain does not resolve, returns, or worsens. Most patients with abdominal pain don't have to be admitted to the hospital or have surgery, but serious problems like appendicitis and gallbladder attacks can start out as nonspecific pain. Many abdominal conditions cannot be diagnosed in one visit, so follow-up evaluations are very important. SEEK IMMEDIATE MEDICAL ATTENTION IF: The pain does not go away or becomes severe.  A temperature above 101 develops.  Repeated vomiting occurs (multiple episodes).  The pain becomes localized to portions of the abdomen. The right side could possibly be appendicitis. In an adult, the left lower portion of the abdomen could be colitis or diverticulitis.  Blood is being passed in stools or vomit (bright red or black tarry stools).  Return also if you develop chest pain, difficulty breathing, dizziness or fainting, or become confused, poorly responsive, or inconsolable (young children).   Urinary Tract Infection Urinary tract infections (UTIs) can develop anywhere along your urinary tract. Your urinary tract is your body's drainage system for removing wastes and extra water. Your urinary tract includes two kidneys, two ureters, a bladder, and a urethra. Your kidneys are a pair of bean-shaped organs. Each kidney is about the size of your fist. They are located below your ribs, one on each side of your spine. CAUSES Infections are caused by microbes, which are microscopic organisms, including fungi, viruses, and bacteria. These organisms are so small that they can only be seen  through a microscope. Bacteria are the microbes that most commonly cause UTIs. SYMPTOMS  Symptoms of UTIs may vary by age and gender of the patient and by the location of the infection. Symptoms in young women typically include a frequent and intense urge to urinate and a painful, burning feeling in the bladder or urethra during urination. Older women and men are more likely to be tired, shaky, and weak and have muscle aches and abdominal pain. A fever may mean the infection is in your kidneys. Other symptoms of a kidney infection include pain in your back or sides below the ribs, nausea, and vomiting. DIAGNOSIS To diagnose a UTI, your caregiver will ask you about your symptoms. Your caregiver also will ask to provide a urine sample. The urine sample will be tested for bacteria and white blood cells. White blood cells are made by your body to help fight infection. TREATMENT  Typically, UTIs can be treated with medication. Because most UTIs are caused by a bacterial infection, they usually can be treated with the use of antibiotics. The choice of antibiotic and length of treatment depend on your symptoms and the type of bacteria causing your infection. HOME CARE INSTRUCTIONS  If you were prescribed antibiotics, take them exactly as your caregiver instructs you. Finish the medication even if you feel better after you have only taken some of the medication.  Drink enough water and fluids to keep your urine clear or pale yellow.  Avoid caffeine, tea, and carbonated beverages. They tend to irritate your bladder.  Empty your bladder often. Avoid holding urine for long periods of time.  Empty your bladder before and after sexual intercourse.  After a bowel movement, women should  cleanse from front to back. Use each tissue only once. SEEK MEDICAL CARE IF:   You have back pain.  You develop a fever.  Your symptoms do not begin to resolve within 3 days. SEEK IMMEDIATE MEDICAL CARE IF:   You have  severe back pain or lower abdominal pain.  You develop chills.  You have nausea or vomiting.  You have continued burning or discomfort with urination. MAKE SURE YOU:   Understand these instructions.  Will watch your condition.  Will get help right away if you are not doing well or get worse. Document Released: 10/07/2004 Document Revised: 06/29/2011 Document Reviewed: 02/05/2011 [facility] Patient Information 2014 [place].  Constipation, Adult Constipation is when a person has fewer than 3 bowel movements a week; has difficulty having a bowel movement; or has stools that are dry, hard, or larger than normal. As people grow older, constipation is more common. If you try to fix constipation with medicines that make you have a bowel movement (laxatives), the problem may get worse. Long-term laxative use may cause the muscles of the colon to become weak. A low-fiber diet, not taking in enough fluids, and taking certain medicines may make constipation worse. CAUSES   Certain medicines, such as antidepressants, pain medicine, iron supplements, antacids, and water pills.   Certain diseases, such as diabetes, irritable bowel syndrome (IBS), thyroid disease, or depression.   Not drinking enough water.   Not eating enough fiber-rich foods.   Stress or travel.  Lack of physical activity or exercise.  Not going to the restroom when there is the urge to have a bowel movement.  Ignoring the urge to have a bowel movement.  Using laxatives too much. SYMPTOMS   Having fewer than 3 bowel movements a week.   Straining to have a bowel movement.   Having hard, dry, or larger than normal stools.   Feeling full or bloated.   Pain in the lower abdomen.  Not feeling relief after having a bowel movement. DIAGNOSIS  Your caregiver will take a medical history and perform a physical exam. Further testing may be done for severe constipation. Some tests may include:   A barium  enema X-ray to examine your rectum, colon, and sometimes, your small intestine.  A sigmoidoscopy to examine your lower colon.  A colonoscopy to examine your entire colon. TREATMENT  Treatment will depend on the severity of your constipation and what is causing it. Some dietary treatments include drinking more fluids and eating more fiber-rich foods. Lifestyle treatments may include regular exercise. If these diet and lifestyle recommendations do not help, your caregiver may recommend taking over-the-counter laxative medicines to help you have bowel movements. Prescription medicines may be prescribed if over-the-counter medicines do not work.  HOME CARE INSTRUCTIONS   Increase dietary fiber in your diet, such as fruits, vegetables, whole grains, and beans. Limit high-fat and processed sugars in your diet, such as [place] fries, hamburgers, cookies, candies, and soda.   A fiber supplement may be added to your diet if you cannot get enough fiber from foods.   Drink enough fluids to keep your urine clear or pale yellow.   Exercise regularly or as directed by your caregiver.   Go to the restroom when you have the urge to go. Do not hold it.  Only take medicines as directed by your caregiver. Do not take other medicines for constipation without talking to your caregiver first. [place] IF:   You have bright red blood  in your stool.   Your constipation lasts for more than 4 days or gets worse.   You have abdominal or rectal pain.   You have thin, pencil-like stools.  You have unexplained weight loss. MAKE SURE YOU:   Understand these instructions.  Will watch your condition.  Will get help right away if you are not doing well or get worse. Document Released: 09/26/2003 Document Revised: 03/22/2011 Document Reviewed: 10/09/2012 [facility] Patient Information 2014 [place], [place].  Fiber Content in Foods Drinking plenty of fluids and consuming foods high in  fiber can help with constipation. See the list below for the fiber content of some common foods. Starches and Grains / Dietary Fiber (g)  Cheerios, 1 cup / 3 g  Kellogg's Corn Flakes, 1 cup / 0.7 g  Rice Krispies, 1  cup / 0.3 g  Quaker Oat Life Cereal,  cup / 2.1 g  Oatmeal, instant (cooked),  cup / 2 g  Kellogg's Frosted Mini Wheats, 1 cup / 5.1 g  Rice, brown, long-grain (cooked), 1 cup / 3.5 g  Rice, white, long-grain (cooked), 1 cup / 0.6 g  Macaroni, cooked, enriched, 1 cup / 2.5 g Legumes / Dietary Fiber (g)  Beans, baked, canned, plain or vegetarian,  cup / 5.2 g  Beans, kidney, canned,  cup / 6.8 g  Beans, pinto, dried (cooked),  cup / 7.7 g  Beans, pinto, canned,  cup / 5.5 g Breads and Crackers / Dietary Fiber (g)  Graham crackers, plain or honey, 2 squares / 0.7 g  Saltine crackers, 3 squares / 0.3 g  Pretzels, plain, salted, 10 pieces / 1.8 g  Bread, whole-wheat, 1 slice / 1.9 g  Bread, white, 1 slice / 0.7 g  Bread, raisin, 1 slice / 1.2 g  Bagel, plain, 3 oz / 2 g  Tortilla, flour, 1 oz / 0.9 g  Tortilla, corn, 1 small / 1.5 g  Bun, hamburger or hotdog, 1 small / 0.9 g Fruits / Dietary Fiber (g)  Apple, raw with skin, 1 medium / 4.4 g  Applesauce, sweetened,  cup / 1.5 g  Banana,  medium / 1.5 g  Grapes, 10 grapes / 0.4 g  Orange, 1 small / 2.3 g  Raisin, 1.5 oz / 1.6 g  Melon, 1 cup / 1.4 g Vegetables / Dietary Fiber (g)  Green beans, canned,  cup / 1.3 g  Carrots (cooked),  cup / 2.3 g  Broccoli (cooked),  cup / 2.8 g  Peas, frozen (cooked),  cup / 4.4 g  Potatoes, mashed,  cup / 1.6 g  Lettuce, 1 cup / 0.5 g  Corn, canned,  cup / 1.6 g  Tomato,  cup / 1.1 g Document Released: 05/16/2006 Document Revised: 03/22/2011 Document Reviewed: 07/11/2006 [employer] Patient Information 2014 [place], [place].

## 2013-06-21 LAB — URINE CULTURE
Colony Count: NO GROWTH
Culture: NO GROWTH

## 2013-06-21 NOTE — ED Provider Notes (Signed)
Medical screening examination/treatment/procedure(s) were performed by non-physician practitioner and as supervising physician I was immediately available for consultation/collaboration.   EKG Interpretation None        Julianne Rice, MD 06/21/13 [phone]

## 2013-06-26 ENCOUNTER — Encounter: Payer: BC Managed Care – PPO | Admitting: Adult Health

## 2013-06-26 ENCOUNTER — Encounter: Payer: Self-pay | Admitting: Adult Health

## 2013-06-26 ENCOUNTER — Ambulatory Visit (INDEPENDENT_AMBULATORY_CARE_PROVIDER_SITE_OTHER): Payer: BC Managed Care – PPO | Admitting: Adult Health

## 2013-06-26 VITALS — BP 128/80 | HR 71 | Temp 97.8°F | Ht 64.5 in | Wt 286.4 lb

## 2013-06-26 DIAGNOSIS — J449 Chronic obstructive pulmonary disease, unspecified: Secondary | ICD-10-CM | POA: Diagnosis not present

## 2013-06-26 NOTE — Assessment & Plan Note (Signed)
Compensated on present regimen  Patient's medications were reviewed today and patient education was given. Computerized medication calendar was adjusted/completed   Plan  Continue on current regimen Follow up Dr. Melvyn Novas in 4 months and as needed

## 2013-06-26 NOTE — Patient Instructions (Signed)
Follow  med calendar closely and bring to each visit.  Follow up Dr. Melvyn Novas  In 4 months and As needed

## 2013-06-26 NOTE — Progress Notes (Signed)
Subjective:    Patient ID: [name] [name], female    DOB: 13-Nov-1950.   MRN: [id number]    Brief patient profile:  30 yowf last smoked 2003 with COPD with minimum asthmatic component with an FEV1 of 55%, a ratio of 53%, and a diffusing capacity 51% documented 07/25/06. On 02 24 hours per day at 2lpm  6/09 weighed 271 and it was felt she was as debilitated as much by obesity as by airflow obstruction.  baseline = when well = walk to mother in [place] but stops at least once because it's uphill and frequently requiring rescue rx.   History of Present Illness  March 19, 2010-- Post hospital visit. Admitted 2/28-03/16/10( dc wt at 269 which was about 10 lbs down) for Acute on chronic resp failure secondary to decompensated diastolic heart failure . She was tx with aggressive diuresis. Conintued on coumadin. INR yesterday was 2.4. 2 D echo showed EF 65% w/ gr 2 diastolic dysfunction . VQ scan with low probability for PE. Since discharge some better but still real weak. Cough and congestion are some better. Edema worse this am, took with lasix that helped. rec  no change in rx   01/17/12 followup and medication review Patient returns for a two-month followup and medication review. Reviewed all her medications and organized them into a medication calendar    It appears that she is taking her medications correctly Last visit. She was decreased to Symbicort 1 puff twice daily to help with possible tremor. This does seem to help somewhat with her tremor She feels that her breathing is not quite as good with increased shortness of breath on the decreased dose of Symbicort, however, she's had no flare of cough and wheezing rec  Begin Tudorza 1 puff Twice daily > better    02/18/2012 f/u ov/Wert cc better until 2 days prior to OV  With congested cough, white mucus, not using hfa saba or neb saba yet, not optimizing action plan on med calendar.   rec Increase symbicort to 160 Take 2 puffs first thing in am  and then another 2 puffs about 12 hours later until better then back to one twice daily 02 is 2lpm 24/7 x outside the house walk on 3lpm pulsed  Work on inhaler technique   03/13/2013 f/u ov/Wert re:  Chronic resp failure/ copd Chief Complaint  Patient presents with  . Follow-up    Breathing has been progressively worse. Using xopenex HFA approx 3 x per day and not using neb often- maybe once per month.   Not using symbicort but 1-2 every 12 hours and poor hfa noted  >>CXR chronic changes ,incraease symbicort 160  06/26/2013 Follow up and Med review  Returns for followup for COPD, and chronic hypoxic respiratory failure, on oxygen therapy at home We reviewed all her medications and organized them into her medication count with patient education Says, overall, her breathing is doing well. She denies any flare of cough, wheezing, or shortness, of breath. She denies hemoptysis, chest pain, orthopnea, PND, or increased leg swelling.    ROS  The following are not active complaints unless bolded sore throat, dysphagia, dental problems, itching, sneezing,  nasal congestion or excess/ purulent secretions, ear ache,   fever, chills, sweats, unintended wt loss, pleuritic or exertional cp, hemoptysis,  orthopnea pnd or leg swelling, presyncope, palpitations, heartburn, abdominal pain, anorexia, nausea, vomiting, diarrhea  or change in bowel or urinary habits, change in stools or urine, dysuria,hematuria,  rash, arthralgias,  visual complaints, headache, numbness weakness or ataxia or problems with walking or coordination,  change in mood/affect or memory.      Past Medical History:  CHRONIC RHINITIS (ICD-472.0)  EDEMA (ICD-782.3)  ACUTE AND CHRONIC RESPIRATORY FAILURE (ICD-518.84)  MORBID OBESITY (ICD-278.01)  - Target wt = 179 for BMI < 30 peak wt 282  - Referred back to nutrition December 18, 2007  DEEP VENOUS THROMBOPHLEBITIS, HX OF (ICD-V12.52)  COPD  (ICD-496)..........................................................Marland KitchenWert  - PFTs 07/25/06 FEV1 55% ratio 53, DLC0 51%  - HFA 50% December 16, 2009 > 75% March 27, 2010  DEPRESSION (ICD-311)  OSTEOPOROSIS (ICD-733.00) last BMD 4/08 -2.4, intolerant of bisphosphonates  HYPERTENSION (ICD-401.9)  Atrial fibrillation - paroxysmal  Anticoagulation therapy  Pulmonary embolism, hx of  neg stress myoview 10/07  Anxiety  glucose intolerance - on steroids  Hyperlipidemia  Complex medical regimen  - Med calendar done 05/01/2008, 08/26/2010 , 01/17/12 , 06/26/2013          Objective:   Physical Exam  GEN: A/Ox3; pleasant , NAD, obese wf nad  Wt  274 09/01/10 >   09/29/2010  275 > 01/01/2011  280 > 04/29/2011  279 > 285 11/17/2011  > 288 02/18/2012 > 281 04/19/2012 > 03/13/2013  282 >06/26/2013   HEENT:  [place]/AT,  EACs-clear, TMs-wnl, NOSE-clear drainage, THROAT-clear, no lesions, no postnasal drip or exudate noted.   NECK:  Supple w/ fair ROM; no JVD; normal carotid impulses w/o bruits; no thyromegaly or nodules palpated; no lymphadenopathy.  RESP  Diminshed BS in bases  w/o, wheezes/ rales/ or rhonchi.no accessory muscle use, no dullness to percussion  CARD:  RRR, no m/r/g  , tr peripheral edema, pulses intact, no cyanosis or clubbing.  GI:   Soft & nt; nml bowel sounds; no organomegaly or masses detected.  Musco: Warm bil, no deformities or joint swelling noted.   Neuro: alert, no focal deficits noted, very min bilat resting tremor    Skin: Warm, no lesions or rashes    CXR   03/13/13 No active cardiopulmonary disease. .         Assessment & Plan:

## 2013-07-02 ENCOUNTER — Telehealth: Payer: Self-pay

## 2013-07-02 ENCOUNTER — Other Ambulatory Visit: Payer: Self-pay | Admitting: *Deleted

## 2013-07-02 ENCOUNTER — Encounter: Payer: Self-pay | Admitting: Internal Medicine

## 2013-07-02 MED ORDER — NYSTATIN 100000 UNIT/GM EX POWD: CUTANEOUS | Status: DC

## 2013-07-02 NOTE — Telephone Encounter (Signed)
Phone call from [place] requesting clarification on patient's Warfarin tabs. Ref # [id number]

## 2013-07-02 NOTE — Telephone Encounter (Signed)
Left msg on triage requesting refill on nystatin powder md rx back in February. She states she keep having the rash under breast. [facility]../lmb

## 2013-07-03 ENCOUNTER — Telehealth: Payer: Self-pay | Admitting: *Deleted

## 2013-07-03 NOTE — Telephone Encounter (Signed)
Left msg on tri[facility] stating [facility] states they have been trying to contact office concerning her medicine. If they don't hear back from office by tomorrow order will not be process. Ref # [id number]. [facility]rep gave ref # that was given. They received refill on her warfarin this morning med is ready yo be sent out as of today. Called pt back no answer LMOM with status of medications...[name] [name]

## 2013-07-03 NOTE — Telephone Encounter (Signed)
Spoke with Helene Kelp, pharmacist with [facility].  Gave verbal OK to change Warfarin manufacturer.

## 2013-07-03 NOTE — Telephone Encounter (Signed)
Left msg on triage requesting new rx on pt warfarin 7.5 mg. Reference # [id number]

## 2013-07-03 NOTE — Telephone Encounter (Signed)
See phone note dated 07/03/13.

## 2013-07-04 ENCOUNTER — Ambulatory Visit (INDEPENDENT_AMBULATORY_CARE_PROVIDER_SITE_OTHER): Payer: BC Managed Care – PPO | Admitting: General Practice

## 2013-07-04 DIAGNOSIS — I4891 Unspecified atrial fibrillation: Secondary | ICD-10-CM | POA: Diagnosis not present

## 2013-07-04 DIAGNOSIS — Z7901 Long term (current) use of anticoagulants: Secondary | ICD-10-CM

## 2013-07-04 DIAGNOSIS — Z8672 Personal history of thrombophlebitis: Secondary | ICD-10-CM

## 2013-07-04 DIAGNOSIS — Z86718 Personal history of other venous thrombosis and embolism: Secondary | ICD-10-CM

## 2013-07-04 DIAGNOSIS — I4892 Unspecified atrial flutter: Secondary | ICD-10-CM | POA: Diagnosis not present

## 2013-07-04 DIAGNOSIS — Z5181 Encounter for therapeutic drug level monitoring: Secondary | ICD-10-CM

## 2013-07-04 LAB — POCT INR: INR: 2.4

## 2013-07-04 NOTE — Progress Notes (Signed)
Pre visit review using our clinic review tool, if applicable. No additional management support is needed unless otherwise documented below in the visit note. 

## 2013-07-05 NOTE — Addendum Note (Signed)
Addended by: Parke Poisson E on: 07/05/2013 05:16 PM   Modules accepted: Orders

## 2013-07-10 ENCOUNTER — Ambulatory Visit (INDEPENDENT_AMBULATORY_CARE_PROVIDER_SITE_OTHER): Payer: BC Managed Care – PPO | Admitting: Internal Medicine

## 2013-07-10 ENCOUNTER — Encounter: Payer: Self-pay | Admitting: Internal Medicine

## 2013-07-10 VITALS — BP 132/78 | HR 76 | Temp 98.1°F | Wt 283.2 lb

## 2013-07-10 DIAGNOSIS — R110 Nausea: Secondary | ICD-10-CM | POA: Diagnosis not present

## 2013-07-10 DIAGNOSIS — J069 Acute upper respiratory infection, unspecified: Secondary | ICD-10-CM | POA: Diagnosis not present

## 2013-07-10 DIAGNOSIS — J449 Chronic obstructive pulmonary disease, unspecified: Secondary | ICD-10-CM | POA: Diagnosis not present

## 2013-07-10 MED ORDER — LEVOFLOXACIN 250 MG PO TABS: 250.0000 mg | ORAL_TABLET | Freq: Every day | ORAL | Status: DC

## 2013-07-10 MED ORDER — ONDANSETRON HCL 4 MG PO TABS: 4.0000 mg | ORAL_TABLET | Freq: Three times a day (TID) | ORAL | Status: DC | PRN

## 2013-07-10 MED ORDER — OMEPRAZOLE 20 MG PO CPDR: DELAYED_RELEASE_CAPSULE | ORAL | Status: DC

## 2013-07-10 NOTE — Assessment & Plan Note (Signed)
Likely related to current illness, for increase prilosec to 40 mg,  to f/u any worsening symptoms or concerns

## 2013-07-10 NOTE — Assessment & Plan Note (Signed)
Mild to mod, for antibx course,  to f/u any worsening symptoms or concerns 

## 2013-07-10 NOTE — Patient Instructions (Signed)
OK to stop the keflex  Please take all new medication as prescribed - the levaquin   OK to increase the prilosec to 2 pills per day (40 mg)  Please continue all other medications as before, and refills have been done if requested.  Please have the pharmacy call with any other refills you may need.  Please keep your appointments with your specialists as you may have planned

## 2013-07-10 NOTE — Progress Notes (Signed)
Pre visit review using our clinic review tool, if applicable. No additional management support is needed unless otherwise documented below in the visit note. 

## 2013-07-10 NOTE — Assessment & Plan Note (Signed)
stable overall by history and exam, recent data reviewed with pt, and pt to continue medical treatment as before,  to f/u any worsening symptoms or concerns SpO2 Readings from Last 3 Encounters:  07/10/13 95%  06/26/13 96%  06/20/13 98%

## 2013-07-10 NOTE — Progress Notes (Signed)
Subjective:    Patient ID: [name] [name], female    DOB: 1950-08-03, 63 y.o.   MRN: [id number]  HPI   Here with 2-3 days acute onset fever, facial pain, pressure, headache, general weakness and malaise, and greenish d/c, with mild ST and cough, but pt denies chest pain, wheezing, increased sob or doe, orthopnea, PND, increased LE swelling, palpitations, dizziness or syncope.  Pt denies chest pain, increased sob or doe, wheezing, orthopnea, PND, increased LE swelling, palpitations, dizziness or syncope. Also with nausea for several days without vomiting, abd pain, bowel change , blood. Past Medical History  Diagnosis Date  . Chronic rhinitis   . Morbid obesity     target wt=179lb for BMI<30 Peak wt 282lb  . History of DVT (deep vein thrombosis)   . COPD (chronic obstructive pulmonary disease)     Wert. PFTs 07/25/06 FEV1 55% ratio 53, DLC0 51% HFA 50% 12/16/2009  . Depression   . Osteoporosis     last BMD 4/08 -2.4, intolerant of bisphosphonates  . Hypertension   . Atrial fibrillation and flutter   . Anxiety   . Glucose intolerance (impaired glucose tolerance)     on steroids  . VITAMIN D DEFICIENCY   . HYPERLIPIDEMIA   . PREMATURE VENTRICULAR CONTRACTIONS   . Rhabdomyolysis 07/29/2009  . FRACTURE, RIB, RIGHT 06/18/2009  . PULMONARY EMBOLISM, HX OF 06/19/2007  . Tremor   . Hx of cardiac catheterization     LHC (01/2001):  Normal Cors.  EF 65%.;  LexiScan Myoview (03/2013): No ischemia, EF 61%, normal  . Hx of echocardiogram     Echo (11/2011):  Mod LVH, EF 60-65%, no RWMA, MAC, mild LAE, PASP 34.   Past Surgical History  Procedure Laterality Date  . Abdominal hysterectomy    . Tonsillectomy    . Skin graft to middle r finger  1975  . S/p left arm fracture with fall off chair      reports that she quit smoking about 17 years ago. Her smoking use included Cigarettes. She has a 35 pack-year smoking history. She has never used smokeless tobacco. She reports that she does not drink  alcohol or use illicit drugs. family history includes Asthma in her brother and son; Heart disease in her mother; Throat cancer in her brother. Allergies  Allergen Reactions  . Duloxetine     REACTION: rhabdomyolysis  . Penicillins Other (See Comments)    Passed out  . Latex Rash   Current Outpatient Prescriptions on File Prior to Visit  Medication Sig Dispense Refill  . acetaminophen (TYLENOL ARTHRITIS PAIN) 650 MG CR tablet As directed per bottle for pain      . Aclidinium Bromide (TUDORZA PRESSAIR) 400 MCG/ACT AEPB Inhale 1 puff into the lungs 2 (two) times daily.      Marland Kitchen albuterol (PROAIR HFA) 108 (90 BASE) MCG/ACT inhaler Inhale 2 puffs into the lungs every 4 (four) hours as needed for wheezing or shortness of breath.       Marland Kitchen atorvastatin (LIPITOR) 10 MG tablet Take 10 mg by mouth at bedtime.       . budesonide-formoterol (SYMBICORT) 160-4.5 MCG/ACT inhaler Inhale 1-2 puffs into the lungs 2 (two) times daily.      . Calcium Carbonate-Vitamin D 600-400 MG-UNIT per tablet Take 1 tablet by mouth 2 (two) times daily.        . cetirizine (ZYRTEC) 10 MG tablet Take 10 mg by mouth at bedtime.       Marland Kitchen  cholecalciferol (VITAMIN D) 1000 UNITS tablet Take 1,000 Units by mouth every morning.      Marland Kitchen Dextromethorphan-Guaifenesin (MUCINEX DM MAXIMUM STRENGTH) 60-1200 MG TB12 Take 1 tablet by mouth every 12 (twelve) hours as needed (cough/congestion).      Marland Kitchen diltiazem (DILTIAZEM CD) 180 MG 24 hr capsule Take 180 mg by mouth every morning.      Marland Kitchen FLUoxetine (PROZAC) 40 MG capsule Take 40 mg by mouth every morning.      . furosemide (LASIX) 80 MG tablet Take 80 mg by mouth 2 (two) times daily. May take 1 extra daily as needed w/ Klor Con for leg swelling      . glucosamine-chondroitin 500-400 MG tablet Take 1 tablet by mouth every morning.       Marland Kitchen KLOR-CON M20 20 MEQ tablet Take 1 tablet (20 mEq total) by mouth 2 (two) times daily.  180 tablet  3  . levalbuterol (XOPENEX) 1.25 MG/3ML nebulizer solution  Take 1.25 mg by nebulization every 4 (four) hours as needed for wheezing or shortness of breath.      . mometasone (NASONEX) 50 MCG/ACT nasal spray Place 2 sprays into the nose 2 (two) times daily as needed (For nasal congestion.).       Marland Kitchen nitroGLYCERIN (NITROSTAT) 0.4 MG SL tablet Place 1 tablet (0.4 mg total) under the tongue every 5 (five) minutes as needed for chest pain.  25 tablet  3  . oxymetazoline (AFRIN) 0.05 % nasal spray Place 2 sprays into the nose 2 (two) times daily as needed for congestion. For 5 days before nasonex      . propafenone (RYTHMOL SR) 325 MG 12 hr capsule Take 1 capsule (325 mg total) by mouth 2 (two) times daily.  180 capsule  3  . sodium chloride (OCEAN) 0.65 % SOLN nasal spray Place 2 sprays into both nostrils every 4 (four) hours as needed for congestion.      . traMADol (ULTRAM) 50 MG tablet Take 50 mg by mouth every 4 (four) hours as needed (For breakthrough cough or pain.).      Marland Kitchen warfarin (COUMADIN) 7.5 MG tablet Take 7.5-11.25 mg by mouth daily at 6 PM. She takes one and a half tablets on Tuesday, Thursday and Saturday and a whole tablet the rest of the week.       No current facility-administered medications on file prior to visit.   Review of Systems  Constitutional: Negative for unusual diaphoresis or other sweats  HENT: Negative for ringing in ear Eyes: Negative for double vision or worsening visual disturbance.  Respiratory: Negative for choking and stridor.   Gastrointestinal: Negative for vomiting or other signifcant bowel change Genitourinary: Negative for hematuria or decreased urine volume.  Musculoskeletal: Negative for other MSK pain or swelling Skin: Negative for color change and worsening wound.  Neurological: Negative for tremors and numbness other than noted  Psychiatric/Behavioral: Negative for decreased concentration or agitation other than above       Objective:   Physical Exam BP 132/78  Pulse 76  Temp(Src) 98.1 F (36.7 C) (Oral)   Wt 283 lb 4 oz (128.481 kg)  SpO2 95% VS noted, mild ill Constitutional: Pt appears well-developed, well-nourished.  HENT: Head: NCAT.  Right Ear: External ear normal.  Left Ear: External ear normal.  Eyes: . Pupils are equal, round, and reactive to light. Conjunctivae and EOM are normal Bilat tm's with mild erythema.  Max sinus areas mild tender.  Pharynx with mild erythema, no exudate Neck:  Normal range of motion. Neck supple.  Cardiovascular: Normal rate and regular rhythm.   Pulmonary/Chest: Effort normal and breath sounds normal.  Abd:  Soft, NT, ND, + BS Neurological: Pt is alert. Not confused , motor grossly intact Skin: Skin is warm. No rash Psychiatric: Pt behavior is normal. No agitation.     Assessment & Plan:

## 2013-07-16 ENCOUNTER — Telehealth: Payer: Self-pay | Admitting: *Deleted

## 2013-07-16 DIAGNOSIS — R198 Other specified symptoms and signs involving the digestive system and abdomen: Secondary | ICD-10-CM

## 2013-07-16 NOTE — Telephone Encounter (Signed)
Left msg on triage stating she is still having problem with her bowels. Requesting to see gastroenterologist.../lmb

## 2013-07-16 NOTE — Telephone Encounter (Signed)
Referral has been placed per pt request

## 2013-07-17 NOTE — Telephone Encounter (Signed)
Notified pt with md response.../lmb 

## 2013-07-19 ENCOUNTER — Encounter: Payer: Self-pay | Admitting: Internal Medicine

## 2013-07-19 ENCOUNTER — Telehealth: Payer: Self-pay | Admitting: Internal Medicine

## 2013-07-19 NOTE — Telephone Encounter (Signed)
Patient will come in and see Dr. Carlean Purl on 08/09/13.  I spoke with the patient and she is aware

## 2013-07-23 ENCOUNTER — Encounter: Payer: Self-pay | Admitting: Gastroenterology

## 2013-08-09 ENCOUNTER — Ambulatory Visit: Payer: BC Managed Care – PPO | Admitting: Internal Medicine

## 2013-08-15 ENCOUNTER — Ambulatory Visit (INDEPENDENT_AMBULATORY_CARE_PROVIDER_SITE_OTHER): Payer: BC Managed Care – PPO | Admitting: Family Medicine

## 2013-08-15 DIAGNOSIS — I4891 Unspecified atrial fibrillation: Secondary | ICD-10-CM | POA: Diagnosis not present

## 2013-08-15 DIAGNOSIS — I4892 Unspecified atrial flutter: Secondary | ICD-10-CM | POA: Diagnosis not present

## 2013-08-15 DIAGNOSIS — Z86718 Personal history of other venous thrombosis and embolism: Secondary | ICD-10-CM

## 2013-08-15 DIAGNOSIS — Z5181 Encounter for therapeutic drug level monitoring: Secondary | ICD-10-CM

## 2013-08-15 DIAGNOSIS — Z8672 Personal history of thrombophlebitis: Secondary | ICD-10-CM

## 2013-08-15 DIAGNOSIS — Z7901 Long term (current) use of anticoagulants: Secondary | ICD-10-CM

## 2013-08-15 LAB — POCT INR: INR: 2.3

## 2013-08-28 ENCOUNTER — Other Ambulatory Visit: Payer: Self-pay

## 2013-08-28 ENCOUNTER — Encounter: Payer: Self-pay | Admitting: Internal Medicine

## 2013-08-28 MED ORDER — OMEPRAZOLE 20 MG PO CPDR: DELAYED_RELEASE_CAPSULE | ORAL | Status: DC

## 2013-09-06 ENCOUNTER — Other Ambulatory Visit: Payer: Self-pay | Admitting: Internal Medicine

## 2013-09-06 MED ORDER — WARFARIN SODIUM 7.5 MG PO TABS: 7.5000 mg | ORAL_TABLET | Freq: Every day | ORAL | Status: DC

## 2013-09-07 ENCOUNTER — Other Ambulatory Visit: Payer: Self-pay | Admitting: Internal Medicine

## 2013-09-11 ENCOUNTER — Encounter: Payer: Self-pay | Admitting: Internal Medicine

## 2013-09-11 ENCOUNTER — Ambulatory Visit (INDEPENDENT_AMBULATORY_CARE_PROVIDER_SITE_OTHER): Payer: BC Managed Care – PPO | Admitting: Internal Medicine

## 2013-09-11 VITALS — BP 110/60 | HR 91 | Temp 98.7°F | Ht 64.0 in | Wt 282.8 lb

## 2013-09-11 DIAGNOSIS — J449 Chronic obstructive pulmonary disease, unspecified: Secondary | ICD-10-CM | POA: Diagnosis not present

## 2013-09-11 DIAGNOSIS — J9610 Chronic respiratory failure, unspecified whether with hypoxia or hypercapnia: Secondary | ICD-10-CM

## 2013-09-11 DIAGNOSIS — J9612 Chronic respiratory failure with hypercapnia: Secondary | ICD-10-CM

## 2013-09-11 MED ORDER — MOXIFLOXACIN HCL 400 MG PO TABS: 400.0000 mg | ORAL_TABLET | Freq: Every day | ORAL | Status: DC

## 2013-09-11 MED ORDER — PREDNISONE 10 MG PO TABS: ORAL_TABLET | ORAL | Status: DC

## 2013-09-11 NOTE — Assessment & Plan Note (Signed)
-   09/29/10  Walked 3lpm  2 laps @ 185 ft each stopped due to  Sob with sats 90%     - HC03 11/08/11 = 34     - 11-17-11--o2 sat on ra at rest 83%ra     - 02/18/2012  Sat 88% RA across the room > Walked 3lpm x 3 laps @ 185 ft each stopped due to  End of study no desat     - 04/19/2012  Walked 3lpm x 3 laps @ 185 ft each stopped due to  End of study, sats dropped to 88 at very end   rx is 2lpm at rest/ sleep/ 3lpm with ex as of 03/13/13

## 2013-09-11 NOTE — Progress Notes (Signed)
Subjective:    Patient ID: [name] [name], female    DOB: 06-28-1950.   MRN: [id number]    Brief patient profile:  55 yowf last smoked 2003 with COPD with minimum asthmatic component with an FEV1 of 55%, a ratio of 53%, and a diffusing capacity 51% documented 07/25/06. On 02 24 hours per day at 2lpm  06/2007 weighed 271 and it was felt she was as debilitated as much by obesity as by airflow obstruction.  baseline = when well = walk to mother in [place] but stops at least once because it's uphill and frequently requiring rescue rx.   History of Present Illness  March 19, 2010-- Post hospital visit. Admitted 2/28-03/16/10( dc wt at 269 which was about 10 lbs down) for Acute on chronic resp failure secondary to decompensated diastolic heart failure . She was tx with aggressive diuresis. Conintued on coumadin. INR yesterday was 2.4. 2 D echo showed EF 78% w/ gr 2 diastolic dysfunction . VQ scan with low probability for PE. Since discharge some better but still real weak. Cough and congestion are some better. Edema worse this am, took with lasix that helped. rec  no change in rx   01/17/12 followup and medication review Patient returns for a two-month followup and medication review. Reviewed all her medications and organized them into a medication calendar    It appears that she is taking her medications correctly Last visit. She was decreased to Symbicort 1 puff twice daily to help with possible tremor. This does seem to help somewhat with her tremor She feels that her breathing is not quite as good with increased shortness of breath on the decreased dose of Symbicort, however, she's had no flare of cough and wheezing rec  Begin Tudorza 1 puff Twice daily > better    02/18/2012 f/u ov/[name] [name] cc better until 2 days prior to OV  With congested cough, white mucus, not using hfa saba or neb saba yet, not optimizing action plan on med calendar.   rec Increase symbicort to 160 Take 2 puffs first thing in  am and then another 2 puffs about 12 hours later until better then back to one twice daily 02 is 2lpm 24/7 x outside the house walk on 3lpm pulsed  Work on inhaler technique   03/13/2013 f/u ov/[name] [name] re:  Chronic resp failure/ copd Chief Complaint  Patient presents with  . Follow-up    Breathing has been progressively worse. Using xopenex HFA approx 3 x per day and not using neb often- maybe once per month.   Not using symbicort but 1-2 every 12 hours and poor hfa noted  >>CXR chronic changes ,incraease symbicort 160 2bid and work mdi     09/11/2013 f/u ov/[name] [name] re: aecopd Chief Complaint  Patient presents with  . Follow-up    fever last night sinus infection coughing yellowish throwing up  has med calendar but not using action plan re max dose of symbicort/ afrin/tramadol in event of a flare. Prior to last several days had been doing well on just symbicort 160 1 bid. Has not used neb yet as back up and usually at baseline not needing saba   No obvious day to day or daytime variabilty or assoc chronic cough or cp or chest tightness, subjective wheeze overt sinus or hb symptoms. No unusual exp hx or h/o childhood pna/ asthma or knowledge of premature birth.  Sleeping ok without nocturnal  or early am exacerbation  of respiratory  c/o's or need  for noct saba. Also denies any obvious fluctuation of symptoms with weather or environmental changes or other aggravating or alleviating factors except as outlined above   Current Medications, Allergies, Complete Past Medical History, Past Surgical History, Family History, and Social History were reviewed in [employer] record.  ROS  The following are not active complaints unless bolded sore throat, dysphagia, dental problems, itching, sneezing,  nasal congestion or excess/ purulent secretions, ear ache,   fever, chills, sweats, unintended wt loss, pleuritic or exertional cp, hemoptysis,  orthopnea pnd or leg swelling, presyncope,  palpitations, heartburn, abdominal pain, anorexia, nausea, vomiting, diarrhea  or change in bowel or urinary habits, change in stools or urine, dysuria,hematuria,  rash, arthralgias, visual complaints, headache, numbness weakness or ataxia or problems with walking or coordination,  change in mood/affect or memory.           Past Medical History:  CHRONIC RHINITIS (ICD-472.0)  EDEMA (ICD-782.3)  ACUTE AND CHRONIC RESPIRATORY FAILURE (ICD-518.84)  MORBID OBESITY (ICD-278.01)  - Target wt = 179 for BMI < 30 peak wt 282  - Referred back to nutrition December 18, 2007  DEEP VENOUS THROMBOPHLEBITIS, HX OF (ICD-V12.52)  COPD (ICD-496)..........................................................Marland KitchenWert  - PFTs 07/25/06 FEV1 55% ratio 53, DLC0 51%  - HFA 50% December 16, 2009 > 75% March 27, 2010  DEPRESSION (ICD-311)  OSTEOPOROSIS (ICD-733.00) last BMD 4/08 -2.4, intolerant of bisphosphonates  HYPERTENSION (ICD-401.9)  Atrial fibrillation - paroxysmal  Anticoagulation therapy  Pulmonary embolism, hx of  neg stress myoview 10/07  Anxiety  glucose intolerance - on steroids  Hyperlipidemia  Complex medical regimen  - Med calendar done 05/01/2008, 08/26/2010 , 01/17/12 , 06/26/2013          Objective:   Physical Exam  GEN: A/Ox3; pleasant , NAD, obese wf nad  Wt  274 09/01/10 >   09/29/2010  275 > 01/01/2011  280 > 04/29/2011  279 > 285 11/17/2011  > 288 02/18/2012 > 281 04/19/2012 > 03/13/2013  282 >  09/11/2013  282   HEENT:  [place]/AT,  EACs-clear, TMs-wnl, NOSE-clear drainage, THROAT-clear, no lesions, no postnasal drip or exudate noted.   NECK:  Supple w/ fair ROM; no JVD; normal carotid impulses w/o bruits; no thyromegaly or nodules palpated; no lymphadenopathy.  RESP  Diminshed BS in bases  w/o, wheezes/ rales/ or rhonchi.no accessory muscle use, no dullness to percussion  CARD:  RRR, no m/r/g  , tr peripheral edema, pulses intact, no cyanosis or clubbing.  GI:   Soft & nt; nml bowel sounds; no  organomegaly or masses detected.  Musco: Warm bil, no deformities or joint swelling noted.   Neuro: alert, no focal deficits noted, very min bilat resting tremor    Skin: Warm, no lesions or rashes    CXR   03/13/13 No active cardiopulmonary disease. .         Assessment & Plan:

## 2013-09-11 NOTE — Assessment & Plan Note (Signed)
-   PFTs 07/25/06 FEV1 55% ratio 53, DLC0 51%  - PFT's 04/19/2012 FEV1 50% (ratio 51) with 19% improvement p B2, dlco 42 > 99% corrected  - med calendar 01/17/2012    Acute flare in setting of ? Uri/ sinusitis > rx avelox  I had an extended discussion with the patient today lasting 15 to 20 minutes of a 25 minute visit on the following issues:   Each maintenance medication was reviewed in detail including most importantly the difference between maintenance and as needed and under what circumstances the prns are to be used. This was done in the context of a medication calendar review which provided the patient with a user-friendly unambiguous mechanism for medication administration and reconciliation and provides an action plan for all active problems. It is critical that this be shown to every doctor  for modification during the office visit if necessary so the patient can use it as a working document.

## 2013-09-11 NOTE — Patient Instructions (Addendum)
Avelox 400 mg daily x 5 days and let the coumadin clinic know you're taking it  Prednisone 10 mg take  4 each am x 2 days,   2 each am x 2 days,  1 each am x 2 days and stop   See calendar for specific medication instructions and bring it back for each and every office visit for every healthcare provider you see.  Without it,  you may not receive the best quality medical care that we feel you deserve.  You will note that the calendar groups together  your maintenance  medications that are timed at particular times of the day.  Think of this as your checklist for what your doctor has instructed you to do until your next evaluation to see what benefit  there is  to staying on a consistent group of medications intended to keep you well.  The other group at the bottom is entirely up to you to use as you see fit  for specific symptoms that may arise between visits that require you to treat them on an as needed basis.  Think of this as your action plan or "what if" list.   Separating the top medications from the bottom group is fundamental to providing you adequate care going forward.    Pulmonary follow up is as needed

## 2013-09-19 ENCOUNTER — Ambulatory Visit (INDEPENDENT_AMBULATORY_CARE_PROVIDER_SITE_OTHER): Payer: BC Managed Care – PPO | Admitting: *Deleted

## 2013-09-19 DIAGNOSIS — Z5181 Encounter for therapeutic drug level monitoring: Secondary | ICD-10-CM

## 2013-09-19 DIAGNOSIS — I4891 Unspecified atrial fibrillation: Secondary | ICD-10-CM

## 2013-09-19 DIAGNOSIS — I4892 Unspecified atrial flutter: Secondary | ICD-10-CM

## 2013-09-19 DIAGNOSIS — Z86718 Personal history of other venous thrombosis and embolism: Secondary | ICD-10-CM

## 2013-09-19 DIAGNOSIS — Z8672 Personal history of thrombophlebitis: Secondary | ICD-10-CM | POA: Diagnosis not present

## 2013-09-19 DIAGNOSIS — Z7901 Long term (current) use of anticoagulants: Secondary | ICD-10-CM

## 2013-09-19 LAB — POCT INR: INR: 2.4

## 2013-09-25 ENCOUNTER — Encounter: Payer: Self-pay | Admitting: Cardiology

## 2013-09-25 ENCOUNTER — Ambulatory Visit (INDEPENDENT_AMBULATORY_CARE_PROVIDER_SITE_OTHER): Payer: BC Managed Care – PPO | Admitting: Cardiology

## 2013-09-25 VITALS — BP 142/82 | HR 79 | Ht 64.5 in | Wt 278.6 lb

## 2013-09-25 DIAGNOSIS — M2390 Unspecified internal derangement of unspecified knee: Secondary | ICD-10-CM | POA: Diagnosis not present

## 2013-09-25 DIAGNOSIS — R072 Precordial pain: Secondary | ICD-10-CM

## 2013-09-25 DIAGNOSIS — I10 Essential (primary) hypertension: Secondary | ICD-10-CM

## 2013-09-25 DIAGNOSIS — E785 Hyperlipidemia, unspecified: Secondary | ICD-10-CM

## 2013-09-25 DIAGNOSIS — I4891 Unspecified atrial fibrillation: Secondary | ICD-10-CM | POA: Diagnosis not present

## 2013-09-25 DIAGNOSIS — M25569 Pain in unspecified knee: Secondary | ICD-10-CM | POA: Diagnosis not present

## 2013-09-25 DIAGNOSIS — R609 Edema, unspecified: Secondary | ICD-10-CM

## 2013-09-25 DIAGNOSIS — I480 Paroxysmal atrial fibrillation: Secondary | ICD-10-CM

## 2013-09-25 LAB — CBC
HCT: 35.6 % — ABNORMAL LOW (ref 36.0–46.0)
Hemoglobin: 11.9 g/dL — ABNORMAL LOW (ref 12.0–15.0)
MCH: 27.2 pg (ref 26.0–34.0)
MCHC: 33.4 g/dL (ref 30.0–36.0)
MCV: 81.5 fL (ref 78.0–100.0)
Platelets: 284 10*3/uL (ref 150–400)
RBC: 4.37 MIL/uL (ref 3.87–5.11)
RDW: 16.8 % — ABNORMAL HIGH (ref 11.5–15.5)
WBC: 9.3 10*3/uL (ref 4.0–10.5)

## 2013-09-25 LAB — BASIC METABOLIC PANEL WITH GFR
BUN: 10 mg/dL (ref 6–23)
CO2: 34 mEq/L — ABNORMAL HIGH (ref 19–32)
Calcium: 8.9 mg/dL (ref 8.4–10.5)
Chloride: 99 mEq/L (ref 96–112)
Creat: 0.78 mg/dL (ref 0.50–1.10)
GFR, Est African American: >89 mL/min
GFR, Est Non African American: 81 mL/min
Glucose, Bld: 123 mg/dL — ABNORMAL HIGH (ref 70–99)
Potassium: 3.8 mEq/L (ref 3.5–5.3)
Sodium: 141 mEq/L (ref 135–145)

## 2013-09-25 NOTE — Progress Notes (Signed)
HPI: FU AFib maintaining NSR on Propafenone, prior pulmonary embolism, chronic coumadin, diastolic dysfunction, morbid obesity, COPD, LE edema. Cardiac cath in 2003 was normal. Echo in 11/13 demonstrated normal LVF. Nuclear study March 2015 showed an ejection fraction 61% and normal perfusion. Since she was last seen her dyspnea is at baseline. No chest pain, palpitations or syncope. No pedal edema.   Current Outpatient Prescriptions  Medication Sig Dispense Refill  . acetaminophen (TYLENOL ARTHRITIS PAIN) 650 MG CR tablet As directed per bottle for pain      . Aclidinium Bromide (TUDORZA PRESSAIR) 400 MCG/ACT AEPB Inhale 1 puff into the lungs 2 (two) times daily.      Marland Kitchen albuterol (PROAIR HFA) 108 (90 BASE) MCG/ACT inhaler Inhale 2 puffs into the lungs every 4 (four) hours as needed for wheezing or shortness of breath.       Marland Kitchen atorvastatin (LIPITOR) 10 MG tablet Take 10 mg by mouth at bedtime.       . budesonide-formoterol (SYMBICORT) 160-4.5 MCG/ACT inhaler Inhale 1-2 puffs into the lungs 2 (two) times daily.      . Calcium Carbonate-Vitamin D 600-400 MG-UNIT per tablet Take 1 tablet by mouth 2 (two) times daily.        . cetirizine (ZYRTEC) 10 MG tablet Take 10 mg by mouth at bedtime.       . cholecalciferol (VITAMIN D) 1000 UNITS tablet Take 1,000 Units by mouth every morning.      Marland Kitchen Dextromethorphan-Guaifenesin (MUCINEX DM MAXIMUM STRENGTH) 60-1200 MG TB12 Take 1 tablet by mouth every 12 (twelve) hours as needed (cough/congestion).      Marland Kitchen diltiazem (DILTIAZEM CD) 180 MG 24 hr capsule Take 180 mg by mouth every morning.      Marland Kitchen FLUoxetine (PROZAC) 40 MG capsule Take 40 mg by mouth every morning.      Marland Kitchen FLUoxetine (PROZAC) 40 MG capsule TAKE 1 CAPSULE DAILY  90 capsule  3  . furosemide (LASIX) 80 MG tablet Take 80 mg by mouth 2 (two) times daily. May take 1 extra daily as needed w/ Klor Con for leg swelling      . glucosamine-chondroitin 500-400 MG tablet Take 1 tablet by mouth every  morning.       Marland Kitchen KLOR-CON M20 20 MEQ tablet Take 1 tablet (20 mEq total) by mouth 2 (two) times daily.  180 tablet  3  . levalbuterol (XOPENEX) 1.25 MG/3ML nebulizer solution Take 1.25 mg by nebulization every 4 (four) hours as needed for wheezing or shortness of breath.      . levofloxacin (LEVAQUIN) 250 MG tablet Take 1 tablet (250 mg total) by mouth daily.  10 tablet  0  . mometasone (NASONEX) 50 MCG/ACT nasal spray Place 2 sprays into the nose 2 (two) times daily as needed (For nasal congestion.).       Marland Kitchen moxifloxacin (AVELOX) 400 MG tablet Take 1 tablet (400 mg total) by mouth daily.  5 tablet  0  . nitroGLYCERIN (NITROSTAT) 0.4 MG SL tablet Place 1 tablet (0.4 mg total) under the tongue every 5 (five) minutes as needed for chest pain.  25 tablet  3  . nystatin (MYCOSTATIN/NYSTOP) 100000 UNIT/GM POWD AS Needed      . omeprazole (PRILOSEC) 20 MG capsule 2 tab by mouth per day  180 capsule  3  . ondansetron (ZOFRAN) 4 MG tablet Take 1 tablet (4 mg total) by mouth every 8 (eight) hours as needed for nausea or vomiting.  20 tablet  0  . oxymetazoline (AFRIN) 0.05 % nasal spray Place 2 sprays into the nose 2 (two) times daily as needed for congestion. For 5 days before nasonex      . predniSONE (DELTASONE) 10 MG tablet Take  4 each am x 2 days,   2 each am x 2 days,  1 each am x 2 days and stop  14 tablet  0  . propafenone (RYTHMOL SR) 325 MG 12 hr capsule Take 1 capsule (325 mg total) by mouth 2 (two) times daily.  180 capsule  3  . sodium chloride (OCEAN) 0.65 % SOLN nasal spray Place 2 sprays into both nostrils every 4 (four) hours as needed for congestion.      . traMADol (ULTRAM) 50 MG tablet Take 50 mg by mouth every 4 (four) hours as needed (For breakthrough cough or pain.).      Marland Kitchen warfarin (COUMADIN) 7.5 MG tablet TAKE AS DIRECTED BY COUMADIN CLINIC  120 tablet  0  . warfarin (COUMADIN) 7.5 MG tablet Take 1-1.5 tablets (7.5-11.25 mg total) by mouth daily at 6 PM. She takes one and a half  tablets on Tuesday, Thursday and Saturday and a whole tablet the rest of the week.  120 tablet  0   No current facility-administered medications for this visit.     Past Medical History  Diagnosis Date  . Chronic rhinitis   . Morbid obesity     target wt=179lb for BMI<30 Peak wt 282lb  . History of DVT (deep vein thrombosis)   . COPD (chronic obstructive pulmonary disease)     Wert. PFTs 07/25/06 FEV1 55% ratio 53, DLC0 51% HFA 50% 12/16/2009  . Depression   . Osteoporosis     last BMD 4/08 -2.4, intolerant of bisphosphonates  . Hypertension   . Atrial fibrillation and flutter   . Anxiety   . Glucose intolerance (impaired glucose tolerance)     on steroids  . VITAMIN D DEFICIENCY   . HYPERLIPIDEMIA   . PREMATURE VENTRICULAR CONTRACTIONS   . Rhabdomyolysis 07/29/2009  . FRACTURE, RIB, RIGHT 06/18/2009  . PULMONARY EMBOLISM, HX OF 06/19/2007  . Tremor   . Hx of cardiac catheterization     LHC (01/2001):  Normal Cors.  EF 65%.;  LexiScan Myoview (03/2013): No ischemia, EF 61%, normal  . Hx of echocardiogram     Echo (11/2011):  Mod LVH, EF 60-65%, no RWMA, MAC, mild LAE, PASP 34.    Past Surgical History  Procedure Laterality Date  . Abdominal hysterectomy    . Tonsillectomy    . Skin graft to middle r finger  1975  . S/p left arm fracture with fall off chair    . Colonoscopy  10/22/2003, ?2006    History   Social History  . Marital Status: Married    Spouse Name: N/A    Number of Children: 3  . Years of Education: N/A   Occupational History  . 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 Smoking status: Former Smoker -- 1.00 packs/day for 35 years    Types: Cigarettes    Quit date: 01/12/1996  . Smokeless tobacco: Never Used  . Alcohol Use: No  . Drug Use: No  . Sexual Activity: Not on file   Other Topics Concern  . Not on file   Social History Narrative  . No narrative on file    ROS: no fevers or chills, productive cough,  hemoptysis, dysphasia, odynophagia, melena,  hematochezia, dysuria, hematuria, rash, seizure activity, orthopnea, PND, pedal edema, claudication. Remaining systems are negative.  Physical Exam: Well-developed morbidly obese in no acute distress.  Skin is warm and dry.  HEENT is normal.  Neck is supple.  Chest is clear to auscultation with normal expansion.  Cardiovascular exam is regular rate and rhythm.  Abdominal exam nontender or distended. No masses palpated. Extremities show no edema. neuro grossly intact  ECG Sinus rhythm at a rate of 79.Nonspecific ST changes.

## 2013-09-25 NOTE — Assessment & Plan Note (Signed)
Continue present medications. 

## 2013-09-25 NOTE — Assessment & Plan Note (Signed)
Patient remains in sinus rhythm. Continue Cardizem and Coumadin. Check hemoglobin.

## 2013-09-25 NOTE — Assessment & Plan Note (Signed)
Management per primary care. 

## 2013-09-25 NOTE — Assessment & Plan Note (Signed)
Needs weight loss. 

## 2013-09-25 NOTE — Patient Instructions (Signed)
Your physician wants you to follow-up in: ONE YEAR WITH DR CRENSHAW You will receive a reminder letter in the mail two months in advance. If you don't receive a letter, please call our office to schedule the follow-up appointment.   Your physician recommends that you HAVE LAB WORK TODAY 

## 2013-09-25 NOTE — Assessment & Plan Note (Signed)
Continue present dose of Lasix. Check potassium and renal function. 

## 2013-09-25 NOTE — Assessment & Plan Note (Signed)
No further episodes. Nuclear study negative in March.

## 2013-09-27 ENCOUNTER — Ambulatory Visit: Payer: BC Managed Care – PPO | Admitting: Internal Medicine

## 2013-10-01 ENCOUNTER — Ambulatory Visit: Payer: BC Managed Care – PPO | Admitting: Internal Medicine

## 2013-10-09 ENCOUNTER — Ambulatory Visit: Payer: BC Managed Care – PPO | Admitting: Internal Medicine

## 2013-10-10 ENCOUNTER — Ambulatory Visit: Payer: BC Managed Care – PPO | Admitting: Internal Medicine

## 2013-10-10 ENCOUNTER — Encounter: Payer: Self-pay | Admitting: Internal Medicine

## 2013-10-10 ENCOUNTER — Ambulatory Visit (INDEPENDENT_AMBULATORY_CARE_PROVIDER_SITE_OTHER): Payer: BC Managed Care – PPO | Admitting: Internal Medicine

## 2013-10-10 VITALS — BP 132/82 | HR 88 | Temp 98.0°F | Resp 14 | Wt 281.0 lb

## 2013-10-10 DIAGNOSIS — J0110 Acute frontal sinusitis, unspecified: Secondary | ICD-10-CM

## 2013-10-10 DIAGNOSIS — J9612 Chronic respiratory failure with hypercapnia: Secondary | ICD-10-CM

## 2013-10-10 DIAGNOSIS — J0111 Acute recurrent frontal sinusitis: Secondary | ICD-10-CM

## 2013-10-10 DIAGNOSIS — J9610 Chronic respiratory failure, unspecified whether with hypoxia or hypercapnia: Secondary | ICD-10-CM

## 2013-10-10 MED ORDER — SULFAMETHOXAZOLE-TMP DS 800-160 MG PO TABS: 1.0000 | ORAL_TABLET | Freq: Two times a day (BID) | ORAL | Status: DC

## 2013-10-10 NOTE — Progress Notes (Signed)
Pre visit review using our clinic review tool, if applicable. No additional management support is needed unless otherwise documented below in the visit note. 

## 2013-10-10 NOTE — Progress Notes (Signed)
   Subjective:    Patient ID: [name] [name], female    DOB: 03-15-1950, 63 y.o.   MRN: [id number]  HPI   She is here with cough and head congestion present for 2 weeks. The cough is productive of light yellow sputum. There is a greater volume of yellow secretions from the head. She has a frontal headaches and facial pain.  She describes itchy, watery eyes, right ear pain with discharge, sore throat, and subjective fever and chills.  She has COPD  & is on 2 L of oxygen chronically. She has had increased shortness of breath and wheezing despite using Symbicort twice daily. She's also on Xopenex and nebulizer 2-3 times per day and albuterol 1-2 times per day.  She is followed by Dr. Melvyn Novas as a COPD Gold 2 patient.     Review of Systems    She denies any dental pain.      Objective:   Physical Exam   Positive or pertinent findings include: She is on portable oxygen by nasal cannula. Based on BMI severe obesity is present. Tympanic membranes are dull. There is no redness, bulging, or discharge present. There is low-grade wheezing noted over the upper airway only w/o stridor. Heart sounds are distant. Breath sounds are also decreased without significant wheezing, rhonchi, rales, or rubs. Accentuated thoracic curvature present. There is an amateur tattoo of right forearm.   General appearance :adequately nourished; in no distress. Eyes: No conjunctival inflammation or scleral icterus is present. Oral exam: Dental hygiene is good. Lips and gums are healthy appearing.There is no oropharyngeal erythema or exudate noted.  Heart:  Normal rate and regular rhythm. S1 and S2 normal without gallop, murmur, click, rub or other extra sounds   Lungs:No increased work of breathing.  Abdomen: Massive;bowel sounds normal, soft and non-tender without masses, organomegaly or hernias noted.  No guarding or rebound. No flank tenderness to percussion. Vascular : all pulses equal ; no bruits  present. Skin:Warm & dry.  Intact without suspicious lesions or rashes ; no jaundice or tenting Lymphatic: No lymphadenopathy is noted about the head, neck, axilla.             Assessment & Plan:  #1 rhinosinusitis without significant bronchitis  Plan: Nasal hygiene interventions discussed. See prescription medications

## 2013-10-10 NOTE — Patient Instructions (Signed)

## 2013-10-10 NOTE — Progress Notes (Signed)
   Subjective:    Patient ID: [name] [name], female    DOB: February 08, 1950, 63 y.o.   MRN: [id number]  HPI  Patient is seen today for complaints of cough and head congestion.  Her symptoms have been ongoing for 2 weeks. Her cough is productive of light yellow sputum.    She also has yellow rhinorrhea, extrinsic allergy symptoms, right ear pain, sore throat, and subjective fevers and chills.  She is chronically on 2L of O2 at home.  She has noticed some increased shortness of breath and wheezing.   She is using Symbicort twice daily. She is using Xopenex nebulizer 2-3 times daily and Albuterol inhaler 1-2 times daily.   She is COPD Gold II and is followed by Dr Melvyn Novas.   She is on Warfarin for atrial fibrillation.  Last INR 09/19/13 2.4.   Review of Systems  Constitutional: Positive for fever, chills and activity change. Negative for appetite change.  HENT: Positive for congestion, ear pain (right), postnasal drip, rhinorrhea, sinus pressure and sore throat. Negative for hearing loss and nosebleeds.   Eyes: Positive for itching. Negative for discharge.  Respiratory: Positive for cough, shortness of breath and wheezing. Negative for apnea, choking and chest tightness.   Cardiovascular: Negative for chest pain, palpitations and leg swelling.  Gastrointestinal: Negative.   Skin: Negative for rash and wound.  Neurological: Negative for dizziness, syncope, weakness, light-headedness and headaches.       Objective:   Physical Exam  Constitutional: She is oriented to person, place, and time. She appears well-nourished. No distress.  HENT:  Head: Normocephalic and atraumatic.  Right Ear: External ear normal.  Nose: Nose normal.  Mouth/Throat: Oropharynx is clear and moist. No oropharyngeal exudate.  Eyes: Conjunctivae are normal. Pupils are equal, round, and reactive to light. Right eye exhibits no discharge. Left eye exhibits no discharge.  Neck: Normal range of motion. Neck supple. No  thyromegaly present.  Cardiovascular: Normal rate, regular rhythm and normal heart sounds.   No murmur heard. Pulmonary/Chest: Effort normal.  Decreased breath sounds  Abdominal: Soft. Bowel sounds are normal.  Lymphadenopathy:    She has no cervical adenopathy.  Neurological: She is alert and oriented to person, place, and time.  Skin: No rash noted. She is not diaphoretic.  Psychiatric: She has a normal mood and affect. Her behavior is normal. Judgment and thought content normal.       Assessment & Plan:

## 2013-10-11 ENCOUNTER — Ambulatory Visit: Payer: BC Managed Care – PPO | Admitting: Internal Medicine

## 2013-10-11 ENCOUNTER — Telehealth: Payer: Self-pay | Admitting: Internal Medicine

## 2013-10-11 MED ORDER — HYDROCODONE-HOMATROPINE 5-1.5 MG/5ML PO SYRP: ORAL_SOLUTION | ORAL | Status: DC

## 2013-10-11 NOTE — Telephone Encounter (Signed)
Patient was in yesterday to be seen.  She states her cough is not getting better and request a cough syrup to [facility] [address]

## 2013-10-11 NOTE — Telephone Encounter (Signed)
Patient notified she needs to pick this script up from the office.

## 2013-10-11 NOTE — Telephone Encounter (Signed)
Hydromet 1 tsp q 6-8 hrs prn, 120 cc

## 2013-10-15 ENCOUNTER — Other Ambulatory Visit: Payer: Self-pay | Admitting: Internal Medicine

## 2013-10-17 ENCOUNTER — Ambulatory Visit (INDEPENDENT_AMBULATORY_CARE_PROVIDER_SITE_OTHER): Payer: BC Managed Care – PPO | Admitting: *Deleted

## 2013-10-17 DIAGNOSIS — Z86718 Personal history of other venous thrombosis and embolism: Secondary | ICD-10-CM

## 2013-10-17 DIAGNOSIS — I480 Paroxysmal atrial fibrillation: Secondary | ICD-10-CM

## 2013-10-17 DIAGNOSIS — Z7901 Long term (current) use of anticoagulants: Secondary | ICD-10-CM

## 2013-10-17 DIAGNOSIS — Z8672 Personal history of thrombophlebitis: Secondary | ICD-10-CM

## 2013-10-17 DIAGNOSIS — Z5181 Encounter for therapeutic drug level monitoring: Secondary | ICD-10-CM | POA: Diagnosis not present

## 2013-10-17 DIAGNOSIS — I4892 Unspecified atrial flutter: Secondary | ICD-10-CM

## 2013-10-17 DIAGNOSIS — I4891 Unspecified atrial fibrillation: Secondary | ICD-10-CM

## 2013-10-17 LAB — POCT INR: INR: 2.6

## 2013-10-20 ENCOUNTER — Other Ambulatory Visit: Payer: Self-pay | Admitting: Cardiology

## 2013-10-20 DIAGNOSIS — M25552 Pain in left hip: Secondary | ICD-10-CM | POA: Diagnosis not present

## 2013-10-20 DIAGNOSIS — M79652 Pain in left thigh: Secondary | ICD-10-CM | POA: Diagnosis not present

## 2013-10-20 DIAGNOSIS — G8929 Other chronic pain: Secondary | ICD-10-CM | POA: Diagnosis not present

## 2013-10-22 ENCOUNTER — Other Ambulatory Visit: Payer: Self-pay | Admitting: Sports Medicine

## 2013-10-22 DIAGNOSIS — M79652 Pain in left thigh: Secondary | ICD-10-CM

## 2013-10-25 ENCOUNTER — Ambulatory Visit
Admission: RE | Admit: 2013-10-25 | Discharge: 2013-10-25 | Disposition: A | Discharge status: Home / Self Care | Payer: BC Managed Care – PPO | Source: Ambulatory Visit | Attending: Sports Medicine | Admitting: Sports Medicine

## 2013-10-25 ENCOUNTER — Inpatient Hospital Stay: Admission: RE | Admit: 2013-10-25 | Payer: BC Managed Care – PPO | Source: Ambulatory Visit

## 2013-10-25 DIAGNOSIS — M79652 Pain in left thigh: Secondary | ICD-10-CM

## 2013-10-25 DIAGNOSIS — M7122 Synovial cyst of popliteal space [Baker], left knee: Secondary | ICD-10-CM | POA: Diagnosis not present

## 2013-10-25 DIAGNOSIS — M25462 Effusion, left knee: Secondary | ICD-10-CM | POA: Diagnosis not present

## 2013-10-25 DIAGNOSIS — M1712 Unilateral primary osteoarthritis, left knee: Secondary | ICD-10-CM | POA: Diagnosis not present

## 2013-10-25 DIAGNOSIS — M1612 Unilateral primary osteoarthritis, left hip: Secondary | ICD-10-CM | POA: Diagnosis not present

## 2013-10-29 DIAGNOSIS — M25552 Pain in left hip: Secondary | ICD-10-CM | POA: Diagnosis not present

## 2013-10-29 DIAGNOSIS — M25562 Pain in left knee: Secondary | ICD-10-CM | POA: Diagnosis not present

## 2013-10-29 DIAGNOSIS — M1612 Unilateral primary osteoarthritis, left hip: Secondary | ICD-10-CM | POA: Diagnosis not present

## 2013-10-30 DIAGNOSIS — Z23 Encounter for immunization: Secondary | ICD-10-CM | POA: Diagnosis not present

## 2013-10-30 DIAGNOSIS — Z1231 Encounter for screening mammogram for malignant neoplasm of breast: Secondary | ICD-10-CM | POA: Diagnosis not present

## 2013-10-31 ENCOUNTER — Ambulatory Visit (INDEPENDENT_AMBULATORY_CARE_PROVIDER_SITE_OTHER): Payer: BC Managed Care – PPO | Admitting: *Deleted

## 2013-10-31 DIAGNOSIS — I4892 Unspecified atrial flutter: Secondary | ICD-10-CM | POA: Diagnosis not present

## 2013-10-31 DIAGNOSIS — Z7901 Long term (current) use of anticoagulants: Secondary | ICD-10-CM

## 2013-10-31 DIAGNOSIS — Z5181 Encounter for therapeutic drug level monitoring: Secondary | ICD-10-CM | POA: Diagnosis not present

## 2013-10-31 DIAGNOSIS — Z8672 Personal history of thrombophlebitis: Secondary | ICD-10-CM

## 2013-10-31 DIAGNOSIS — I4891 Unspecified atrial fibrillation: Secondary | ICD-10-CM | POA: Diagnosis not present

## 2013-10-31 DIAGNOSIS — Z86718 Personal history of other venous thrombosis and embolism: Secondary | ICD-10-CM | POA: Diagnosis not present

## 2013-10-31 LAB — POCT INR: INR: 4.5

## 2013-11-09 ENCOUNTER — Telehealth: Payer: Self-pay | Admitting: Internal Medicine

## 2013-11-09 MED ORDER — HYDROCODONE-HOMATROPINE 5-1.5 MG/5ML PO SYRP: ORAL_SOLUTION | ORAL | Status: DC

## 2013-11-09 NOTE — Telephone Encounter (Signed)
Notified pt rx ready for pick-up.../lmb 

## 2013-11-09 NOTE — Telephone Encounter (Signed)
OK X1 

## 2013-11-09 NOTE — Telephone Encounter (Signed)
Pt requesting refill Hydrocodone syrup for her cough. [place]

## 2013-11-09 NOTE — Telephone Encounter (Signed)
Pt saw dr. Linna Darner for sinus 10/10/13. Pls advise on msg below...Johny Chess

## 2013-11-21 ENCOUNTER — Ambulatory Visit (INDEPENDENT_AMBULATORY_CARE_PROVIDER_SITE_OTHER): Payer: BC Managed Care – PPO

## 2013-11-21 ENCOUNTER — Encounter: Payer: Self-pay | Admitting: Internal Medicine

## 2013-11-21 DIAGNOSIS — Z86718 Personal history of other venous thrombosis and embolism: Secondary | ICD-10-CM

## 2013-11-21 DIAGNOSIS — Z8672 Personal history of thrombophlebitis: Secondary | ICD-10-CM

## 2013-11-21 DIAGNOSIS — I4891 Unspecified atrial fibrillation: Secondary | ICD-10-CM

## 2013-11-21 DIAGNOSIS — Z5181 Encounter for therapeutic drug level monitoring: Secondary | ICD-10-CM

## 2013-11-21 DIAGNOSIS — Z7901 Long term (current) use of anticoagulants: Secondary | ICD-10-CM

## 2013-11-21 DIAGNOSIS — I4892 Unspecified atrial flutter: Secondary | ICD-10-CM | POA: Diagnosis not present

## 2013-11-21 LAB — POCT INR: INR: 2.1

## 2013-12-07 ENCOUNTER — Other Ambulatory Visit: Payer: Self-pay | Admitting: Internal Medicine

## 2013-12-07 DIAGNOSIS — I4891 Unspecified atrial fibrillation: Secondary | ICD-10-CM

## 2013-12-12 ENCOUNTER — Telehealth: Payer: Self-pay | Admitting: Family

## 2013-12-12 ENCOUNTER — Ambulatory Visit (INDEPENDENT_AMBULATORY_CARE_PROVIDER_SITE_OTHER): Payer: BC Managed Care – PPO

## 2013-12-12 DIAGNOSIS — I4892 Unspecified atrial flutter: Secondary | ICD-10-CM

## 2013-12-12 DIAGNOSIS — Z8672 Personal history of thrombophlebitis: Secondary | ICD-10-CM

## 2013-12-12 DIAGNOSIS — Z7901 Long term (current) use of anticoagulants: Secondary | ICD-10-CM

## 2013-12-12 DIAGNOSIS — Z5181 Encounter for therapeutic drug level monitoring: Secondary | ICD-10-CM

## 2013-12-12 DIAGNOSIS — I4891 Unspecified atrial fibrillation: Secondary | ICD-10-CM

## 2013-12-12 DIAGNOSIS — Z86718 Personal history of other venous thrombosis and embolism: Secondary | ICD-10-CM

## 2013-12-12 LAB — POCT INR: INR: 1.6

## 2013-12-12 NOTE — Telephone Encounter (Signed)
Agree with plan 

## 2013-12-12 NOTE — Progress Notes (Deleted)
   Subjective:    Patient ID: [name] [name], female    DOB: [birth year], 63 y.o.   MRN: [id number]  HPI    Review of Systems     Objective:   Physical Exam        Assessment & Plan:

## 2013-12-26 ENCOUNTER — Ambulatory Visit (INDEPENDENT_AMBULATORY_CARE_PROVIDER_SITE_OTHER): Payer: BC Managed Care – PPO

## 2013-12-26 ENCOUNTER — Telehealth: Payer: Self-pay | Admitting: Family

## 2013-12-26 DIAGNOSIS — I4892 Unspecified atrial flutter: Secondary | ICD-10-CM

## 2013-12-26 DIAGNOSIS — Z7901 Long term (current) use of anticoagulants: Secondary | ICD-10-CM

## 2013-12-26 DIAGNOSIS — Z8672 Personal history of thrombophlebitis: Secondary | ICD-10-CM

## 2013-12-26 DIAGNOSIS — Z5181 Encounter for therapeutic drug level monitoring: Secondary | ICD-10-CM

## 2013-12-26 DIAGNOSIS — I4891 Unspecified atrial fibrillation: Secondary | ICD-10-CM

## 2013-12-26 DIAGNOSIS — Z86718 Personal history of other venous thrombosis and embolism: Secondary | ICD-10-CM

## 2013-12-26 LAB — POCT INR: INR: 4.2

## 2013-12-26 NOTE — Telephone Encounter (Signed)
Agree with plan 

## 2014-01-02 ENCOUNTER — Ambulatory Visit: Payer: BC Managed Care – PPO | Admitting: Family Medicine

## 2014-01-02 DIAGNOSIS — Z86718 Personal history of other venous thrombosis and embolism: Secondary | ICD-10-CM

## 2014-01-02 DIAGNOSIS — Z5181 Encounter for therapeutic drug level monitoring: Secondary | ICD-10-CM

## 2014-01-02 DIAGNOSIS — I4891 Unspecified atrial fibrillation: Secondary | ICD-10-CM

## 2014-01-02 DIAGNOSIS — Z8672 Personal history of thrombophlebitis: Secondary | ICD-10-CM

## 2014-01-02 DIAGNOSIS — I480 Paroxysmal atrial fibrillation: Secondary | ICD-10-CM

## 2014-01-02 DIAGNOSIS — I4892 Unspecified atrial flutter: Secondary | ICD-10-CM

## 2014-01-02 DIAGNOSIS — Z7901 Long term (current) use of anticoagulants: Secondary | ICD-10-CM

## 2014-01-02 LAB — POCT INR: INR: 2.1

## 2014-01-30 ENCOUNTER — Ambulatory Visit (INDEPENDENT_AMBULATORY_CARE_PROVIDER_SITE_OTHER): Payer: BLUE CROSS/BLUE SHIELD

## 2014-01-30 DIAGNOSIS — Z8672 Personal history of thrombophlebitis: Secondary | ICD-10-CM

## 2014-01-30 DIAGNOSIS — Z7901 Long term (current) use of anticoagulants: Secondary | ICD-10-CM

## 2014-01-30 DIAGNOSIS — I4892 Unspecified atrial flutter: Secondary | ICD-10-CM

## 2014-01-30 DIAGNOSIS — I4891 Unspecified atrial fibrillation: Secondary | ICD-10-CM | POA: Diagnosis not present

## 2014-01-30 DIAGNOSIS — Z5181 Encounter for therapeutic drug level monitoring: Secondary | ICD-10-CM

## 2014-01-30 DIAGNOSIS — Z86718 Personal history of other venous thrombosis and embolism: Secondary | ICD-10-CM

## 2014-01-30 LAB — POCT INR: INR: 2.4

## 2014-01-31 ENCOUNTER — Encounter: Payer: Self-pay | Admitting: Internal Medicine

## 2014-01-31 ENCOUNTER — Other Ambulatory Visit (INDEPENDENT_AMBULATORY_CARE_PROVIDER_SITE_OTHER): Payer: BLUE CROSS/BLUE SHIELD

## 2014-01-31 ENCOUNTER — Ambulatory Visit (INDEPENDENT_AMBULATORY_CARE_PROVIDER_SITE_OTHER): Payer: BLUE CROSS/BLUE SHIELD | Admitting: Internal Medicine

## 2014-01-31 VITALS — BP 142/70 | HR 83 | Temp 98.7°F | Ht 64.0 in | Wt 284.2 lb

## 2014-01-31 DIAGNOSIS — R7302 Impaired glucose tolerance (oral): Secondary | ICD-10-CM

## 2014-01-31 DIAGNOSIS — Z0000 Encounter for general adult medical examination without abnormal findings: Secondary | ICD-10-CM

## 2014-01-31 LAB — URINALYSIS, ROUTINE W REFLEX MICROSCOPIC
Bilirubin Urine: NEGATIVE
Hgb urine dipstick: NEGATIVE
Ketones, ur: NEGATIVE
Nitrite: NEGATIVE
Specific Gravity, Urine: 1.02 (ref 1.000–1.030)
Total Protein, Urine: NEGATIVE
Urine Glucose: NEGATIVE
Urobilinogen, UA: 1 (ref 0.0–1.0)
pH: 7.5 (ref 5.0–8.0)

## 2014-01-31 LAB — HEPATIC FUNCTION PANEL
ALT: 13 U/L (ref 0–35)
AST: 16 U/L (ref 0–37)
Albumin: 3.9 g/dL (ref 3.5–5.2)
Alkaline Phosphatase: 105 U/L (ref 39–117)
Bilirubin, Direct: 0.2 mg/dL (ref 0.0–0.3)
Total Bilirubin: 1.1 mg/dL (ref 0.2–1.2)
Total Protein: 6.9 g/dL (ref 6.0–8.3)

## 2014-01-31 LAB — CBC WITH DIFFERENTIAL/PLATELET
Basophils Absolute: 0 K/uL (ref 0.0–0.1)
Basophils Relative: 0.3 % (ref 0.0–3.0)
Eosinophils Absolute: 0.1 K/uL (ref 0.0–0.7)
Eosinophils Relative: 1.5 % (ref 0.0–5.0)
HCT: 33 % — ABNORMAL LOW (ref 36.0–46.0)
Hemoglobin: 11.1 g/dL — ABNORMAL LOW (ref 12.0–15.0)
Lymphocytes Relative: 28.6 % (ref 12.0–46.0)
Lymphs Abs: 2.7 K/uL (ref 0.7–4.0)
MCHC: 33.7 g/dL (ref 30.0–36.0)
MCV: 83.4 fl (ref 78.0–100.0)
Monocytes Absolute: 0.8 K/uL (ref 0.1–1.0)
Monocytes Relative: 8.2 % (ref 3.0–12.0)
Neutro Abs: 5.8 K/uL (ref 1.4–7.7)
Neutrophils Relative %: 61.4 % (ref 43.0–77.0)
Platelets: 257 K/uL (ref 150.0–400.0)
RBC: 3.96 Mil/uL (ref 3.87–5.11)
RDW: 18.7 % — ABNORMAL HIGH (ref 11.5–15.5)
WBC: 9.5 K/uL (ref 4.0–10.5)

## 2014-01-31 LAB — BASIC METABOLIC PANEL
BUN: 12 mg/dL (ref 6–23)
CO2: 35 mEq/L — ABNORMAL HIGH (ref 19–32)
Calcium: 8.9 mg/dL (ref 8.4–10.5)
Chloride: 101 mEq/L (ref 96–112)
Creatinine, Ser: 0.83 mg/dL (ref 0.40–1.20)
GFR: 73.64 mL/min (ref >60.00)
Glucose, Bld: 110 mg/dL — ABNORMAL HIGH (ref 70–99)
Potassium: 3.8 mEq/L (ref 3.5–5.1)
Sodium: 143 mEq/L (ref 135–145)

## 2014-01-31 LAB — HEMOGLOBIN A1C: Hgb A1c MFr Bld: 5.2 % (ref 4.6–6.5)

## 2014-01-31 LAB — LIPID PANEL
Cholesterol: 140 mg/dL (ref 0–200)
HDL: 37.8 mg/dL — ABNORMAL LOW (ref >39.00)
LDL Cholesterol: 73 mg/dL (ref 0–99)
NonHDL: 102.2
Total CHOL/HDL Ratio: 4
Triglycerides: 146 mg/dL (ref 0.0–149.0)
VLDL: 29.2 mg/dL (ref 0.0–40.0)

## 2014-01-31 LAB — TSH: TSH: 4.64 u[IU]/mL — ABNORMAL HIGH (ref 0.35–4.50)

## 2014-01-31 NOTE — Patient Instructions (Signed)

## 2014-01-31 NOTE — Assessment & Plan Note (Signed)

## 2014-01-31 NOTE — Progress Notes (Signed)
Pre visit review using our clinic review tool, if applicable. No additional management support is needed unless otherwise documented below in the visit note. 

## 2014-01-31 NOTE — Assessment & Plan Note (Signed)
Asymt, also for a1c

## 2014-01-31 NOTE — Progress Notes (Signed)
Subjective:    Patient ID: [name] [name], female    DOB: 01/23/1950, 64 y.o.   MRN: [id number]  HPI    .Here for wellness and f/u;  Overall doing ok;  Pt denies CP, worsening SOB, DOE, wheezing, orthopnea, PND, worsening LE edema, palpitations, dizziness or syncope.  Pt denies neurological change such as new headache, facial or extremity weakness.  Pt denies polydipsia, polyuria, or low sugar symptoms. Pt states overall good compliance with treatment and medications, good tolerability, and has been trying to follow lower cholesterol diet.  Pt denies worsening depressive symptoms, suicidal ideation or panic. No fever, night sweats, wt loss, loss of appetite, or other constitutional symptoms.  Pt states good ability with ADL's, has low fall risk, home safety reviewed and adequate, no other significant changes in hearing or vision, and only occasionally active with exercise.  Not checking BP aqt home but plans to start more often. Left hip pain much improved/resolved after seeing Dr Tamala Julian recent Past Medical History  Diagnosis Date  . Chronic rhinitis   . Morbid obesity     target wt=179lb for BMI<30 Peak wt 282lb  . History of DVT (deep vein thrombosis)   . COPD (chronic obstructive pulmonary disease)     Wert. PFTs 07/25/06 FEV1 55% ratio 53, DLC0 51% HFA 50% 12/16/2009  . Depression   . Osteoporosis     last BMD 4/08 -2.4, intolerant of bisphosphonates  . Hypertension   . Atrial fibrillation and flutter   . Anxiety   . Glucose intolerance (impaired glucose tolerance)     on steroids  . VITAMIN D DEFICIENCY   . HYPERLIPIDEMIA   . PREMATURE VENTRICULAR CONTRACTIONS   . Rhabdomyolysis 07/29/2009  . FRACTURE, RIB, RIGHT 06/18/2009  . PULMONARY EMBOLISM, HX OF 06/19/2007  . Tremor   . Hx of cardiac catheterization     LHC (01/2001):  Normal Cors.  EF 65%.;  LexiScan Myoview (03/2013): No ischemia, EF 61%, normal  . Hx of echocardiogram     Echo (11/2011):  Mod LVH, EF 60-65%, no RWMA, MAC, mild  LAE, PASP 34.   Past Surgical History  Procedure Laterality Date  . Abdominal hysterectomy    . Tonsillectomy    . Skin graft to middle r finger  1975  . S/p left arm fracture with fall off chair    . Colonoscopy  10/22/2003, ?2006    reports that she quit smoking about 18 years ago. Her smoking use included Cigarettes. She has a 35 pack-year smoking history. She has never used smokeless tobacco. She reports that she does not drink alcohol or use illicit drugs. family history includes Asthma in her brother and son; Heart disease in her mother; Throat cancer in her brother. Allergies  Allergen Reactions  . Duloxetine     REACTION: rhabdomyolysis  . Penicillins Other (See Comments)    Passed out  . Latex Rash   Current Outpatient Prescriptions on File Prior to Visit  Medication Sig Dispense Refill  . acetaminophen (TYLENOL ARTHRITIS PAIN) 650 MG CR tablet As directed per bottle for pain    . Aclidinium Bromide (TUDORZA PRESSAIR) 400 MCG/ACT AEPB Inhale 1 puff into the lungs 2 (two) times daily.    Marland Kitchen albuterol (PROAIR HFA) 108 (90 BASE) MCG/ACT inhaler Inhale 2 puffs into the lungs every 4 (four) hours as needed for wheezing or shortness of breath.     Marland Kitchen atorvastatin (LIPITOR) 10 MG tablet Take 10 mg by mouth at bedtime.     Marland Kitchen  budesonide-formoterol (SYMBICORT) 160-4.5 MCG/ACT inhaler Inhale 1-2 puffs into the lungs 2 (two) times daily.    . Calcium Carbonate-Vitamin D 600-400 MG-UNIT per tablet Take 1 tablet by mouth 2 (two) times daily.      . cetirizine (ZYRTEC) 10 MG tablet Take 10 mg by mouth at bedtime.     . cholecalciferol (VITAMIN D) 1000 UNITS tablet Take 1,000 Units by mouth every morning.    Marland Kitchen Dextromethorphan-Guaifenesin (MUCINEX DM MAXIMUM STRENGTH) 60-1200 MG TB12 Take 1 tablet by mouth every 12 (twelve) hours as needed (cough/congestion).    Marland Kitchen diltiazem (DILTIAZEM CD) 180 MG 24 hr capsule Take 180 mg by mouth every morning.    Marland Kitchen FLUoxetine (PROZAC) 40 MG capsule TAKE 1  CAPSULE DAILY 90 capsule 3  . furosemide (LASIX) 80 MG tablet Take 80 mg by mouth 2 (two) times daily. May take 1 extra daily as needed w/ Klor Con for leg swelling    . furosemide (LASIX) 80 MG tablet TAKE 1 TABLET TWICE A DAY 180 tablet 3  . glucosamine-chondroitin 500-400 MG tablet Take 1 tablet by mouth every morning.     Marland Kitchen HYDROcodone-homatropine (HYCODAN) 5-1.5 MG/5ML syrup 1 tsp every 6-8 hours prn 120 mL 0  . KLOR-CON M20 20 MEQ tablet Take 1 tablet (20 mEq total) by mouth 2 (two) times daily. 180 tablet 3  . levalbuterol (XOPENEX) 1.25 MG/3ML nebulizer solution Take 1.25 mg by nebulization every 4 (four) hours as needed for wheezing or shortness of breath.    . mometasone (NASONEX) 50 MCG/ACT nasal spray Place 2 sprays into the nose 2 (two) times daily as needed (For nasal congestion.).     Marland Kitchen moxifloxacin (AVELOX) 400 MG tablet Take 1 tablet (400 mg total) by mouth daily. 5 tablet 0  . nitroGLYCERIN (NITROSTAT) 0.4 MG SL tablet Place 1 tablet (0.4 mg total) under the tongue every 5 (five) minutes as needed for chest pain. 25 tablet 3  . nystatin (MYCOSTATIN/NYSTOP) 100000 UNIT/GM POWD AS Needed    . omeprazole (PRILOSEC) 20 MG capsule 2 tab by mouth per day 180 capsule 3  . ondansetron (ZOFRAN) 4 MG tablet Take 1 tablet (4 mg total) by mouth every 8 (eight) hours as needed for nausea or vomiting. 20 tablet 0  . oxymetazoline (AFRIN) 0.05 % nasal spray Place 2 sprays into the nose 2 (two) times daily as needed for congestion. For 5 days before nasonex    . predniSONE (DELTASONE) 10 MG tablet Take  4 each am x 2 days,   2 each am x 2 days,  1 each am x 2 days and stop 14 tablet 0  . propafenone (RYTHMOL SR) 325 MG 12 hr capsule Take 1 capsule (325 mg total) by mouth 2 (two) times daily. 180 capsule 3  . sodium chloride (OCEAN) 0.65 % SOLN nasal spray Place 2 sprays into both nostrils every 4 (four) hours as needed for congestion.    . sulfamethoxazole-trimethoprim (BACTRIM DS) 800-160 MG per  tablet Take 1 tablet by mouth 2 (two) times daily. 14 tablet 0  . SYMBICORT 160-4.5 MCG/ACT inhaler INHALE 2 PUFFS INTO THE LUNGS TWICE A DAY 3 Inhaler 1  . traMADol (ULTRAM) 50 MG tablet Take 50 mg by mouth every 4 (four) hours as needed (For breakthrough cough or pain.).    Marland Kitchen warfarin (COUMADIN) 7.5 MG tablet TAKE AS DIRECTED BY COUMADIN CLINIC 120 tablet 0   No current facility-administered medications on file prior to visit.   Review of Systems  Constitutional: Negative for increased diaphoresis, other activity, appetite or other siginficant weight change  HENT: Negative for worsening hearing loss, ear pain, facial swelling, mouth sores and neck stiffness.   Eyes: Negative for other worsening pain, redness or visual disturbance.  Respiratory: Negative for shortness of breath and wheezing.   Cardiovascular: Negative for chest pain and palpitations.  Gastrointestinal: Negative for diarrhea, blood in stool, abdominal distention or other pain Genitourinary: Negative for hematuria, flank pain or change in urine volume.  Musculoskeletal: Negative for myalgias or other joint complaints.  Skin: Negative for color change and wound.  Neurological: Negative for syncope and numbness. other than noted Hematological: Negative for adenopathy. or other swelling Psychiatric/Behavioral: Negative for hallucinations, self-injury, decreased concentration or other worsening agitation.      Objective:   Physical Exam BP 142/70 mmHg  Pulse 83  Temp(Src) 98.7 F (37.1 C) (Oral)  Ht 5\' 4"  (1.626 m)  Wt 284 lb 4 oz (128.935 kg)  BMI 48.77 kg/m2  SpO2 92% VS noted, not ill appearing Constitutional: Pt is oriented to person, place, and time. Appears well-developed and well-nourished.  Head: Normocephalic and atraumatic.  Right Ear: External ear normal.  Left Ear: External ear normal.  Nose: Nose normal.  Mouth/Throat: Oropharynx is clear and moist.  Eyes: Conjunctivae and EOM are normal. Pupils are  equal, round, and reactive to light.  Neck: Normal range of motion. Neck supple. No JVD present. No tracheal deviation present.  Cardiovascular: Normal rate, regular rhythm, normal heart sounds and intact distal pulses.   Pulmonary/Chest: Effort normal but decreased breath sounds without rales or wheezing  Abdominal: Soft. Bowel sounds are normal. NT. No HSM  Musculoskeletal: Normal range of motion. Exhibits no edema.  Lymphadenopathy:  Has no cervical adenopathy.  Neurological: Pt is alert and oriented to person, place, and time. Pt has normal reflexes. No cranial nerve deficit. Motor grossly intact Skin: Skin is warm and dry. No rash noted.  Psychiatric:  Has normal mood and affect. Behavior is normal.     Assessment & Plan:

## 2014-02-13 ENCOUNTER — Encounter: Payer: Self-pay | Admitting: Internal Medicine

## 2014-02-13 DIAGNOSIS — M25561 Pain in right knee: Secondary | ICD-10-CM | POA: Insufficient documentation

## 2014-02-13 DIAGNOSIS — M25562 Pain in left knee: Principal | ICD-10-CM

## 2014-02-13 NOTE — Telephone Encounter (Signed)
Done hardcopy to [facility] - rx for lift chair

## 2014-02-14 ENCOUNTER — Telehealth: Payer: Self-pay | Admitting: Internal Medicine

## 2014-02-14 NOTE — Telephone Encounter (Signed)
[place] for notes to be faxed

## 2014-02-14 NOTE — Telephone Encounter (Signed)
Notes faxed to [facility] at [phone].

## 2014-02-14 NOTE — Telephone Encounter (Signed)
Is requesting notes for lift chair to be faxed to [facility] on [address].  Patient does not have fax number.

## 2014-02-27 ENCOUNTER — Ambulatory Visit (INDEPENDENT_AMBULATORY_CARE_PROVIDER_SITE_OTHER): Payer: BLUE CROSS/BLUE SHIELD | Admitting: General Practice

## 2014-02-27 DIAGNOSIS — Z86718 Personal history of other venous thrombosis and embolism: Secondary | ICD-10-CM

## 2014-02-27 DIAGNOSIS — Z5181 Encounter for therapeutic drug level monitoring: Secondary | ICD-10-CM

## 2014-02-27 DIAGNOSIS — Z7901 Long term (current) use of anticoagulants: Secondary | ICD-10-CM

## 2014-02-27 DIAGNOSIS — I4892 Unspecified atrial flutter: Secondary | ICD-10-CM

## 2014-02-27 DIAGNOSIS — I4891 Unspecified atrial fibrillation: Secondary | ICD-10-CM | POA: Diagnosis not present

## 2014-02-27 DIAGNOSIS — Z8672 Personal history of thrombophlebitis: Secondary | ICD-10-CM | POA: Diagnosis not present

## 2014-02-27 LAB — POCT INR: INR: 2.3

## 2014-02-27 NOTE — Progress Notes (Signed)
Pre visit review using our clinic review tool, if applicable. No additional management support is needed unless otherwise documented below in the visit note. 

## 2014-02-27 NOTE — Progress Notes (Signed)
Agree with plan 

## 2014-03-04 ENCOUNTER — Telehealth: Payer: Self-pay | Admitting: Internal Medicine

## 2014-03-04 ENCOUNTER — Encounter: Payer: Self-pay | Admitting: Nurse Practitioner

## 2014-03-04 ENCOUNTER — Ambulatory Visit (INDEPENDENT_AMBULATORY_CARE_PROVIDER_SITE_OTHER): Payer: BLUE CROSS/BLUE SHIELD | Admitting: Nurse Practitioner

## 2014-03-04 VITALS — BP 120/86 | HR 84 | Temp 98.1°F | Ht 64.0 in | Wt 275.0 lb

## 2014-03-04 DIAGNOSIS — R202 Paresthesia of skin: Secondary | ICD-10-CM

## 2014-03-04 DIAGNOSIS — J069 Acute upper respiratory infection, unspecified: Secondary | ICD-10-CM

## 2014-03-04 MED ORDER — PREDNISONE 10 MG PO TABS: ORAL_TABLET | ORAL | Status: DC

## 2014-03-04 MED ORDER — DOXYCYCLINE HYCLATE 100 MG PO TABS: 100.0000 mg | ORAL_TABLET | Freq: Two times a day (BID) | ORAL | Status: DC

## 2014-03-04 MED ORDER — HYDROCODONE-HOMATROPINE 5-1.5 MG/5ML PO SYRP: 5.0000 mL | ORAL_SOLUTION | Freq: Every evening | ORAL | Status: DC | PRN

## 2014-03-04 NOTE — Patient Instructions (Signed)
Start antibiotic. Eat yogurt daily to help prevent antibiotic -associated diarrhea  Start prednisone. Take in morning so it will not keep you awake.  Continue mucinex.  Continue albuterol ned 1-2 times daily for 3-4 days. Continue symbicort.  Take cough ONLY at night.  For coumadin, skip Tuesday, Thursday, & Saturday. I will send note to [facility].  Follow up in 10 days or sooner if feeling worse. Nice to meet you!

## 2014-03-04 NOTE — Telephone Encounter (Signed)
Patient called stating her prescriptions from today's visit [facility] [address]. [facility]

## 2014-03-04 NOTE — Progress Notes (Signed)
Pre visit review using our clinic review tool, if applicable. No additional management support is needed unless otherwise documented below in the visit note. 

## 2014-03-04 NOTE — Telephone Encounter (Signed)
[facility]

## 2014-03-04 NOTE — Progress Notes (Signed)
   Subjective:    Patient ID: [name] [name], female    DOB: 19-Dec-1950, 64 y.o.   MRN: [id number]  HPI Comments: Pt also c/o numb fingertips L 2 &3 digits when woke this am. She has Hx arthritis in neck. Denies pain in arm, hand, dizzy, vision change. Has HX pain in shoulder. She c/o "woozy" occasionally for several weeks-had fall at home several weeks ago, was seen & treated.  Fever  This is a new problem. The current episode started yesterday. The problem occurs constantly. The problem has been waxing and waning. The maximum temperature noted was 100 to 100.9 F. The temperature was taken using an oral thermometer. Associated symptoms include congestion, coughing, headaches, muscle aches, nausea and wheezing. Pertinent negatives include no abdominal pain, chest pain, diarrhea, ear pain, sore throat or vomiting. Treatments tried: alb neb twice daily. The treatment provided moderate (Feels better today-more energy & coughing less.) relief.      Review of Systems  Constitutional: Positive for fever and fatigue.  HENT: Positive for congestion. Negative for ear pain and sore throat.   Respiratory: Positive for cough and wheezing. Negative for chest tightness and shortness of breath.   Cardiovascular: Negative for chest pain.  Gastrointestinal: Positive for nausea. Negative for vomiting, abdominal pain and diarrhea.  Neurological: Positive for tremors (reports alb neb makes her shaky for a "while") and headaches. Negative for weakness.       Objective:   Physical Exam  Constitutional: She is oriented to person, place, and time. She appears well-developed and well-nourished. No distress.  HENT:  Head: Normocephalic and atraumatic.  Right Ear: External ear normal.  Left Ear: External ear normal.  Mouth/Throat: Oropharynx is clear and moist. No oropharyngeal exudate.  Eyes: Conjunctivae are normal. Right eye exhibits no discharge. Left eye exhibits no discharge.  Neck: Normal range of motion.  Neck supple. No thyromegaly present.  Cardiovascular: Normal rate and normal heart sounds.   No murmur heard. Pulmonary/Chest: Effort normal and breath sounds normal. No respiratory distress. She has no wheezes. She has no rales.  Using oxygen by [place], portable OXygen  Lymphadenopathy:    She has no cervical adenopathy.  Neurological: She is alert and oriented to person, place, and time.  Skin: Skin is warm.  Sweaty-pt states she has been sweaty since she got out of shower this am.  Psychiatric: She has a normal mood and affect. Her behavior is normal. Thought content normal.          Assessment & Plan:  1. Upper respiratory infection with cough and congestion -prednisone -doxycycline Hold coumadin Tues, Thurs, Sat. Sent msg to coumadin nurse-cindy Luciana Axe, RN - HYDROcodone-homatropine (HYCODAN) 5-1.5 MG/5ML syrup; Take 5 mLs by mouth at bedtime as needed for cough.  Dispense: 118 mL; Refill: 0  2. Paresthesias in left hand Likely cervical radiculopathy. Likely to improve w/prednisone F/u 10 days

## 2014-03-05 ENCOUNTER — Encounter: Payer: Self-pay | Admitting: Internal Medicine

## 2014-03-14 ENCOUNTER — Encounter: Payer: Self-pay | Admitting: Internal Medicine

## 2014-03-15 ENCOUNTER — Ambulatory Visit (INDEPENDENT_AMBULATORY_CARE_PROVIDER_SITE_OTHER): Payer: BLUE CROSS/BLUE SHIELD | Admitting: Internal Medicine

## 2014-03-15 ENCOUNTER — Encounter: Payer: Self-pay | Admitting: Internal Medicine

## 2014-03-15 VITALS — BP 148/80 | HR 69 | Temp 98.0°F | Ht 64.0 in | Wt 275.1 lb

## 2014-03-15 DIAGNOSIS — J309 Allergic rhinitis, unspecified: Secondary | ICD-10-CM | POA: Diagnosis not present

## 2014-03-15 DIAGNOSIS — J449 Chronic obstructive pulmonary disease, unspecified: Secondary | ICD-10-CM

## 2014-03-15 DIAGNOSIS — I10 Essential (primary) hypertension: Secondary | ICD-10-CM | POA: Diagnosis not present

## 2014-03-15 MED ORDER — FLUTICASONE PROPIONATE 50 MCG/ACT NA SUSP: 2.0000 | Freq: Every day | NASAL | Status: DC

## 2014-03-15 MED ORDER — METHYLPREDNISOLONE ACETATE 80 MG/ML IJ SUSP
80.0000 mg | Freq: Once | INTRAMUSCULAR | Status: AC
  Administered 2014-03-15: 80 mg via INTRAMUSCULAR

## 2014-03-15 NOTE — Patient Instructions (Addendum)
You had the steroid shot today  Please continue all other medications as before, and refills have been done if requested - the flonase  Please have the pharmacy call with any other refills you may need..  Please keep your appointments with your specialists as you may have planned  No need for lab work today  Please return in 6 months, or sooner if needed

## 2014-03-15 NOTE — Progress Notes (Signed)
Subjective:    Patient ID: [name] [name], female    DOB: 01-09-1951, 64 y.o.   MRN: [id number]  HPI Here to f/u - Does have several wks ongoing nasal allergy symptoms with clearish congestion, itch and sneezing, without fever, pain, ST, cough, swelling or wheezing.  Pt denies chest pain, increased sob or doe, wheezing, orthopnea, PND, increased LE swelling, palpitations, dizziness or syncope. Good compliance with home o2.  Pt denies polydipsia, polyuria. Pt denies new neurological symptoms such as new headache, or facial or extremity weakness or numbness  No fever or worsening pain.,  Still with ongoing bilat knee pain which limits her ability. Has appt to see specialist Past Medical History  Diagnosis Date  . Chronic rhinitis   . Morbid obesity     target wt=179lb for BMI<30 Peak wt 282lb  . History of DVT (deep vein thrombosis)   . COPD (chronic obstructive pulmonary disease)     [place]. PFTs 07/25/06 FEV1 55% ratio 53, DLC0 51% HFA 50% 12/16/2009  . Depression   . Osteoporosis     last BMD 4/08 -2.4, intolerant of bisphosphonates  . Hypertension   . Atrial fibrillation and flutter   . Anxiety   . Glucose intolerance (impaired glucose tolerance)     on steroids  . VITAMIN D DEFICIENCY   . HYPERLIPIDEMIA   . PREMATURE VENTRICULAR CONTRACTIONS   . Rhabdomyolysis 07/29/2009  . FRACTURE, RIB, RIGHT 06/18/2009  . PULMONARY EMBOLISM, HX OF 06/19/2007  . Tremor   . Hx of cardiac catheterization     LHC (01/2001):  Normal Cors.  EF 65%.;  LexiScan Myoview (03/2013): No ischemia, EF 61%, normal  . Hx of echocardiogram     Echo (11/2011):  Mod LVH, EF 60-65%, no RWMA, MAC, mild LAE, PASP 34.   Past Surgical History  Procedure Laterality Date  . Abdominal hysterectomy    . Tonsillectomy    . Skin graft to middle r finger  1975  . S/p left arm fracture with fall off chair    . Colonoscopy  10/22/2003, ?2006    reports that she quit smoking about 18 years ago. Her smoking use included  Cigarettes. She has a 35 pack-year smoking history. She has never used smokeless tobacco. She reports that she does not drink alcohol or use illicit drugs. family history includes Asthma in her brother and son; Heart disease in her mother; Throat cancer in her brother. Allergies  Allergen Reactions  . Duloxetine     REACTION: rhabdomyolysis  . Penicillins Other (See Comments)    Passed out  . Latex Rash   Current Outpatient Prescriptions on File Prior to Visit  Medication Sig Dispense Refill  . acetaminophen (TYLENOL ARTHRITIS PAIN) 650 MG CR tablet As directed per bottle for pain    . Aclidinium Bromide (TUDORZA PRESSAIR) 400 MCG/ACT AEPB Inhale 1 puff into the lungs 2 (two) times daily.    Marland Kitchen albuterol (PROAIR HFA) 108 (90 BASE) MCG/ACT inhaler Inhale 2 puffs into the lungs every 4 (four) hours as needed for wheezing or shortness of breath.     Marland Kitchen atorvastatin (LIPITOR) 10 MG tablet Take 10 mg by mouth at bedtime.     . budesonide-formoterol (SYMBICORT) 160-4.5 MCG/ACT inhaler Inhale 1-2 puffs into the lungs 2 (two) times daily.    . Calcium Carbonate-Vitamin D 600-400 MG-UNIT per tablet Take 1 tablet by mouth 2 (two) times daily.      . cetirizine (ZYRTEC) 10 MG tablet Take 10 mg by  mouth at bedtime.     . cholecalciferol (VITAMIN D) 1000 UNITS tablet Take 1,000 Units by mouth every morning.    Marland Kitchen Dextromethorphan-Guaifenesin (MUCINEX DM MAXIMUM STRENGTH) 60-1200 MG TB12 Take 1 tablet by mouth every 12 (twelve) hours as needed (cough/congestion).    Marland Kitchen diltiazem (DILTIAZEM CD) 180 MG 24 hr capsule Take 180 mg by mouth every morning.    Marland Kitchen doxycycline (VIBRA-TABS) 100 MG tablet Take 1 tablet (100 mg total) by mouth 2 (two) times daily. 14 tablet 0  . FLUoxetine (PROZAC) 40 MG capsule TAKE 1 CAPSULE DAILY 90 capsule 3  . furosemide (LASIX) 80 MG tablet Take 80 mg by mouth 2 (two) times daily. May take 1 extra daily as needed w/ Klor Con for leg swelling    . furosemide (LASIX) 80 MG tablet TAKE  1 TABLET TWICE A DAY 180 tablet 3  . glucosamine-chondroitin 500-400 MG tablet Take 1 tablet by mouth every morning.     Marland Kitchen HYDROcodone-homatropine (HYCODAN) 5-1.5 MG/5ML syrup Take 5 mLs by mouth at bedtime as needed for cough. 118 mL 0  . KLOR-CON M20 20 MEQ tablet Take 1 tablet (20 mEq total) by mouth 2 (two) times daily. 180 tablet 3  . levalbuterol (XOPENEX) 1.25 MG/3ML nebulizer solution Take 1.25 mg by nebulization every 4 (four) hours as needed for wheezing or shortness of breath.    . mometasone (NASONEX) 50 MCG/ACT nasal spray Place 2 sprays into the nose 2 (two) times daily as needed (For nasal congestion.).     Marland Kitchen nitroGLYCERIN (NITROSTAT) 0.4 MG SL tablet Place 1 tablet (0.4 mg total) under the tongue every 5 (five) minutes as needed for chest pain. 25 tablet 3  . nystatin (MYCOSTATIN/NYSTOP) 100000 UNIT/GM POWD AS Needed    . omeprazole (PRILOSEC) 20 MG capsule 2 tab by mouth per day 180 capsule 3  . ondansetron (ZOFRAN) 4 MG tablet Take 1 tablet (4 mg total) by mouth every 8 (eight) hours as needed for nausea or vomiting. 20 tablet 0  . oxymetazoline (AFRIN) 0.05 % nasal spray Place 2 sprays into the nose 2 (two) times daily as needed for congestion. For 5 days before nasonex    . predniSONE (DELTASONE) 10 MG tablet Take  4 each am x 2 days,   2 each am x 2 days,  1 each am x 2 days and stop 14 tablet 0  . propafenone (RYTHMOL SR) 325 MG 12 hr capsule Take 1 capsule (325 mg total) by mouth 2 (two) times daily. 180 capsule 3  . sodium chloride (OCEAN) 0.65 % SOLN nasal spray Place 2 sprays into both nostrils every 4 (four) hours as needed for congestion.    . SYMBICORT 160-4.5 MCG/ACT inhaler INHALE 2 PUFFS INTO THE LUNGS TWICE A DAY 3 Inhaler 1  . traMADol (ULTRAM) 50 MG tablet Take 50 mg by mouth every 4 (four) hours as needed (For breakthrough cough or pain.).    Marland Kitchen warfarin (COUMADIN) 7.5 MG tablet TAKE AS DIRECTED BY COUMADIN CLINIC 120 tablet 0   No current facility-administered  medications on file prior to visit.   Review of Systems  Constitutional: Negative for unusual diaphoresis or other sweats  HENT: Negative for ringing in ear Eyes: Negative for double vision or worsening visual disturbance.  Respiratory: Negative for choking and stridor.   Gastrointestinal: Negative for vomiting or other signifcant bowel change Genitourinary: Negative for hematuria or decreased urine volume.  Musculoskeletal: Negative for other MSK pain or swelling Skin: Negative for  color change and worsening wound.  Neurological: Negative for tremors and numbness other than noted  Psychiatric/Behavioral: Negative for decreased concentration or agitation other than above       Objective:   Physical Exam BP 148/80 mmHg  Pulse 69  Temp(Src) 98 F (36.7 C) (Oral)  Ht 5\' 4"  (1.626 m)  Wt 275 lb 1.9 oz (124.794 kg)  BMI 47.20 kg/m2  SpO2 97% VS noted,  Constitutional: Pt appears well-developed, well-nourished.  HENT: Head: NCAT.  Right Ear: External ear normal.  Left Ear: External ear normal.  Eyes: . Pupils are equal, round, and reactive to light. Conjunctivae and EOM are normal Bilat tm's with mild erythema.  Max sinus areas non tender.  Pharynx with mild erythema, no exudate Neck: Normal range of motion. Neck supple.  Cardiovascular: Normal rate and regular rhythm.   Pulmonary/Chest: Effort normal and breath sounds decreased without rales or wheezing.  Abd:  Soft, NT, ND, + BS Neurological: Pt is alert. Not confused , motor grossly intact Skin: Skin is warm. No rash Psychiatric: Pt behavior is normal. No agitation.     Assessment & Plan:

## 2014-03-15 NOTE — Assessment & Plan Note (Signed)
Mild to mod, for depomedrol IM, and flonase asd,  to f/u any worsening symptoms or concerns

## 2014-03-15 NOTE — Assessment & Plan Note (Signed)
stable overall by history and exam, recent data reviewed with pt, and pt to continue medical treatment as before,  to f/u any worsening symptoms or concerns SpO2 Readings from Last 3 Encounters:  03/15/14 97%  03/04/14 97%  01/31/14 92%

## 2014-03-15 NOTE — Assessment & Plan Note (Signed)
stable overall by history and exam, recent data reviewed with pt, and pt to continue medical treatment as before,  to f/u any worsening symptoms or concerns BP Readings from Last 3 Encounters:  03/15/14 148/80  03/04/14 120/86  01/31/14 142/70

## 2014-03-15 NOTE — Progress Notes (Signed)
Pre visit review using our clinic review tool, if applicable. No additional management support is needed unless otherwise documented below in the visit note. 

## 2014-03-19 ENCOUNTER — Other Ambulatory Visit: Payer: Self-pay | Admitting: Physician Assistant

## 2014-03-26 ENCOUNTER — Telehealth: Payer: Self-pay | Admitting: Internal Medicine

## 2014-03-26 NOTE — Telephone Encounter (Signed)
Pt want to speak to the assistant concern about her cough, pt came in and got medication for a cough but still not go away, no mucus,no wheezing. Please advise on what to do

## 2014-03-27 NOTE — Telephone Encounter (Signed)
If no fever, or worsening sob, ok to follow, can try delsym otc bid prn as well

## 2014-03-27 NOTE — Telephone Encounter (Signed)
Pt called to check up on this. 

## 2014-03-27 NOTE — Telephone Encounter (Signed)
Notified pt with md response.../lmb 

## 2014-04-03 DIAGNOSIS — Z6841 Body Mass Index (BMI) 40.0 and over, adult: Secondary | ICD-10-CM | POA: Diagnosis not present

## 2014-04-03 DIAGNOSIS — R8290 Unspecified abnormal findings in urine: Secondary | ICD-10-CM | POA: Diagnosis not present

## 2014-04-03 DIAGNOSIS — Z1389 Encounter for screening for other disorder: Secondary | ICD-10-CM | POA: Diagnosis not present

## 2014-04-03 DIAGNOSIS — N816 Rectocele: Secondary | ICD-10-CM | POA: Diagnosis not present

## 2014-04-03 DIAGNOSIS — Z01419 Encounter for gynecological examination (general) (routine) without abnormal findings: Secondary | ICD-10-CM | POA: Diagnosis not present

## 2014-04-03 DIAGNOSIS — Z130 Encounter for screening for diseases of the blood and blood-forming organs and certain disorders involving the immune mechanism: Secondary | ICD-10-CM | POA: Diagnosis not present

## 2014-04-08 ENCOUNTER — Emergency Department (HOSPITAL_COMMUNITY): Payer: BLUE CROSS/BLUE SHIELD

## 2014-04-08 ENCOUNTER — Encounter (HOSPITAL_COMMUNITY): Payer: Self-pay | Admitting: Emergency Medicine

## 2014-04-08 ENCOUNTER — Emergency Department (HOSPITAL_COMMUNITY)
Admission: EM | Admit: 2014-04-08 | Discharge: 2014-04-08 | Disposition: A | Discharge status: Home / Self Care | Payer: BLUE CROSS/BLUE SHIELD | Attending: Emergency Medicine | Admitting: Emergency Medicine

## 2014-04-08 DIAGNOSIS — Z9889 Other specified postprocedural states: Secondary | ICD-10-CM | POA: Insufficient documentation

## 2014-04-08 DIAGNOSIS — Z86718 Personal history of other venous thrombosis and embolism: Secondary | ICD-10-CM | POA: Diagnosis not present

## 2014-04-08 DIAGNOSIS — W19XXXA Unspecified fall, initial encounter: Secondary | ICD-10-CM

## 2014-04-08 DIAGNOSIS — Z87891 Personal history of nicotine dependence: Secondary | ICD-10-CM | POA: Diagnosis not present

## 2014-04-08 DIAGNOSIS — E785 Hyperlipidemia, unspecified: Secondary | ICD-10-CM | POA: Insufficient documentation

## 2014-04-08 DIAGNOSIS — W1839XA Other fall on same level, initial encounter: Secondary | ICD-10-CM | POA: Insufficient documentation

## 2014-04-08 DIAGNOSIS — R071 Chest pain on breathing: Secondary | ICD-10-CM | POA: Diagnosis not present

## 2014-04-08 DIAGNOSIS — F419 Anxiety disorder, unspecified: Secondary | ICD-10-CM | POA: Insufficient documentation

## 2014-04-08 DIAGNOSIS — Y998 Other external cause status: Secondary | ICD-10-CM | POA: Insufficient documentation

## 2014-04-08 DIAGNOSIS — I10 Essential (primary) hypertension: Secondary | ICD-10-CM | POA: Diagnosis not present

## 2014-04-08 DIAGNOSIS — S29001A Unspecified injury of muscle and tendon of front wall of thorax, initial encounter: Secondary | ICD-10-CM | POA: Diagnosis not present

## 2014-04-08 DIAGNOSIS — S3991XA Unspecified injury of abdomen, initial encounter: Secondary | ICD-10-CM | POA: Diagnosis not present

## 2014-04-08 DIAGNOSIS — Y9389 Activity, other specified: Secondary | ICD-10-CM | POA: Insufficient documentation

## 2014-04-08 DIAGNOSIS — F329 Major depressive disorder, single episode, unspecified: Secondary | ICD-10-CM | POA: Diagnosis not present

## 2014-04-08 DIAGNOSIS — Y9289 Other specified places as the place of occurrence of the external cause: Secondary | ICD-10-CM | POA: Diagnosis not present

## 2014-04-08 DIAGNOSIS — E559 Vitamin D deficiency, unspecified: Secondary | ICD-10-CM | POA: Diagnosis not present

## 2014-04-08 DIAGNOSIS — J441 Chronic obstructive pulmonary disease with (acute) exacerbation: Secondary | ICD-10-CM | POA: Insufficient documentation

## 2014-04-08 DIAGNOSIS — Z7901 Long term (current) use of anticoagulants: Secondary | ICD-10-CM | POA: Insufficient documentation

## 2014-04-08 DIAGNOSIS — R0781 Pleurodynia: Secondary | ICD-10-CM

## 2014-04-08 DIAGNOSIS — Z79899 Other long term (current) drug therapy: Secondary | ICD-10-CM | POA: Insufficient documentation

## 2014-04-08 DIAGNOSIS — I509 Heart failure, unspecified: Secondary | ICD-10-CM | POA: Diagnosis not present

## 2014-04-08 DIAGNOSIS — M810 Age-related osteoporosis without current pathological fracture: Secondary | ICD-10-CM | POA: Insufficient documentation

## 2014-04-08 DIAGNOSIS — Z880 Allergy status to penicillin: Secondary | ICD-10-CM | POA: Diagnosis not present

## 2014-04-08 DIAGNOSIS — Z7951 Long term (current) use of inhaled steroids: Secondary | ICD-10-CM | POA: Diagnosis not present

## 2014-04-08 DIAGNOSIS — Z91040 Latex allergy status: Secondary | ICD-10-CM | POA: Insufficient documentation

## 2014-04-08 DIAGNOSIS — T148XXA Other injury of unspecified body region, initial encounter: Secondary | ICD-10-CM

## 2014-04-08 DIAGNOSIS — R112 Nausea with vomiting, unspecified: Secondary | ICD-10-CM | POA: Insufficient documentation

## 2014-04-08 DIAGNOSIS — Z8781 Personal history of (healed) traumatic fracture: Secondary | ICD-10-CM | POA: Insufficient documentation

## 2014-04-08 DIAGNOSIS — S2020XA Contusion of thorax, unspecified, initial encounter: Secondary | ICD-10-CM | POA: Diagnosis not present

## 2014-04-08 HISTORY — DX: Unspecified asthma, uncomplicated: J45.909

## 2014-04-08 LAB — CBC WITH DIFFERENTIAL/PLATELET
Basophils Absolute: 0 10*3/uL (ref 0.0–0.1)
Basophils Relative: 0 % (ref 0–1)
Eosinophils Absolute: 0.1 10*3/uL (ref 0.0–0.7)
Eosinophils Relative: 1 % (ref 0–5)
HCT: 33.5 % — ABNORMAL LOW (ref 36.0–46.0)
Hemoglobin: 10.8 g/dL — ABNORMAL LOW (ref 12.0–15.0)
Lymphocytes Relative: 19 % (ref 12–46)
Lymphs Abs: 1.7 10*3/uL (ref 0.7–4.0)
MCH: 28.3 pg (ref 26.0–34.0)
MCHC: 32.2 g/dL (ref 30.0–36.0)
MCV: 87.7 fL (ref 78.0–100.0)
Monocytes Absolute: 0.7 10*3/uL (ref 0.1–1.0)
Monocytes Relative: 7 % (ref 3–12)
Neutro Abs: 6.7 10*3/uL (ref 1.7–7.7)
Neutrophils Relative %: 73 % (ref 43–77)
Platelets: 227 10*3/uL (ref 150–400)
RBC: 3.82 MIL/uL — ABNORMAL LOW (ref 3.87–5.11)
RDW: 16.7 % — ABNORMAL HIGH (ref 11.5–15.5)
WBC: 9.2 10*3/uL (ref 4.0–10.5)

## 2014-04-08 LAB — TROPONIN I: Troponin I: <0.03 ng/mL (ref <0.031)

## 2014-04-08 LAB — COMPREHENSIVE METABOLIC PANEL
ALT: 15 U/L (ref 0–35)
AST: 16 U/L (ref 0–37)
Albumin: 3.4 g/dL — ABNORMAL LOW (ref 3.5–5.2)
Alkaline Phosphatase: 90 U/L (ref 39–117)
Anion gap: 9 (ref 5–15)
BUN: 11 mg/dL (ref 6–23)
CO2: 30 mmol/L (ref 19–32)
Calcium: 8.5 mg/dL (ref 8.4–10.5)
Chloride: 103 mmol/L (ref 96–112)
Creatinine, Ser: 0.73 mg/dL (ref 0.50–1.10)
GFR calc Af Amer: >90 mL/min (ref >90)
GFR calc non Af Amer: 89 mL/min — ABNORMAL LOW (ref >90)
Glucose, Bld: 157 mg/dL — ABNORMAL HIGH (ref 70–99)
Potassium: 3.9 mmol/L (ref 3.5–5.1)
Sodium: 142 mmol/L (ref 135–145)
Total Bilirubin: 0.8 mg/dL (ref 0.3–1.2)
Total Protein: 6.4 g/dL (ref 6.0–8.3)

## 2014-04-08 LAB — PROTIME-INR
INR: 2.09 — ABNORMAL HIGH (ref 0.00–1.49)
Prothrombin Time: 23.6 seconds — ABNORMAL HIGH (ref 11.6–15.2)

## 2014-04-08 LAB — APTT: aPTT: 35 seconds (ref 24–37)

## 2014-04-08 MED ORDER — IOHEXOL 300 MG/ML  SOLN
100.0000 mL | Freq: Once | INTRAMUSCULAR | Status: AC | PRN
  Administered 2014-04-08: 100 mL via INTRAVENOUS

## 2014-04-08 MED ORDER — SODIUM CHLORIDE 0.9 % IV SOLN
INTRAVENOUS | Status: DC
  Administered 2014-04-08: 18:00:00 via INTRAVENOUS

## 2014-04-08 NOTE — ED Notes (Signed)
Pt ambulated to the restroom, pt got a little winded, but stated she felt fine and normally wears o2 at home. Pt 02 98%

## 2014-04-08 NOTE — ED Notes (Signed)
Patient transported to radiology

## 2014-04-08 NOTE — ED Notes (Addendum)
Pt arrives via [facility] from home with c collar in place. Pt states she tripped over some bricks and fell in her flower garden. C/o rt rib and rt knee pain. EMS reports pt unable to lie supine, states she is unable to breathe in this position. 170mcg fentanyl given prior to arrival. Pt alert, oriented x4. Pt had episode of vomiting upon arrival to ed.

## 2014-04-08 NOTE — Discharge Instructions (Signed)
Please call your doctor for a followup appointment within 24-48 hours. When you talk to your doctor please let them know that you were seen in the emergency department and have them acquire all of your records so that they can discuss the findings with you and formulate a treatment plan to fully care for your new and ongoing problems. Please follow up with your primary care provider within 24-48 hours Please rest and stay hydrated Please avoid any heavy lifting, strenuous activities, physical activities Please apply pressure with a pillow if you have to cough, sneeze, or laugh Please continue to monitor symptoms closely and if symptoms are to worsen or change (fever greater than 101, chills, sweating, nausea, vomiting, chest pain, shortness of breathe, difficulty breathing, weakness, numbness, tingling, worsening or changes to pain pattern, fall, injury, headache, dizziness, weakness, numbness, loss of sensation, visual changes, neck pain, neck stiffness, changes to mental status) please report back to the Emergency Department immediately.   Contusion A contusion is a deep bruise. Contusions are the result of an injury that caused bleeding under the skin. The contusion may turn blue, purple, or yellow. Minor injuries will give you a painless contusion, but more severe contusions may stay painful and swollen for a few weeks.  CAUSES  A contusion is usually caused by a blow, trauma, or direct force to an area of the body. SYMPTOMS   Swelling and redness of the injured area.  Bruising of the injured area.  Tenderness and soreness of the injured area.  Pain. DIAGNOSIS  The diagnosis can be made by taking a history and physical exam. An X-ray, CT scan, or MRI may be needed to determine if there were any associated injuries, such as fractures. TREATMENT  Specific treatment will depend on what area of the body was injured. In general, the best treatment for a contusion is resting, icing, elevating,  and applying cold compresses to the injured area. Over-the-counter medicines may also be recommended for pain control. Ask your caregiver what the best treatment is for your contusion. HOME CARE INSTRUCTIONS   Put ice on the injured area.  Put ice in a plastic bag.  Place a towel between your skin and the bag.  Leave the ice on for 15-20 minutes, 3-4 times a day, or as directed by your health care provider.  Only take over-the-counter or prescription medicines for pain, discomfort, or fever as directed by your caregiver. Your caregiver may recommend avoiding anti-inflammatory medicines (aspirin, ibuprofen, and naproxen) for 48 hours because these medicines may increase bruising.  Rest the injured area.  If possible, elevate the injured area to reduce swelling. SEEK IMMEDIATE MEDICAL CARE IF:   You have increased bruising or swelling.  You have pain that is getting worse.  Your swelling or pain is not relieved with medicines. MAKE SURE YOU:   Understand these instructions.  Will watch your condition.  Will get help right away if you are not doing well or get worse. Document Released: 10/07/2004 Document Revised: 01/02/2013 Document Reviewed: 11/02/2010 [facility] Patient Information 2015 [place], [place]. This information is not intended to replace advice given to you by your health care provider. Make sure you discuss any questions you have with your health care provider.

## 2014-04-08 NOTE — ED Notes (Addendum)
Patient back to room from radiology, patient transported to CT at this time
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
## 2014-04-10 ENCOUNTER — Ambulatory Visit: Payer: Medicare Other

## 2014-04-11 ENCOUNTER — Ambulatory Visit (INDEPENDENT_AMBULATORY_CARE_PROVIDER_SITE_OTHER): Payer: BLUE CROSS/BLUE SHIELD | Admitting: Internal Medicine

## 2014-04-11 ENCOUNTER — Encounter: Payer: Self-pay | Admitting: Internal Medicine

## 2014-04-11 VITALS — BP 140/84 | HR 88 | Temp 97.8°F | Resp 18 | Ht 64.0 in | Wt 275.1 lb

## 2014-04-11 DIAGNOSIS — J449 Chronic obstructive pulmonary disease, unspecified: Secondary | ICD-10-CM

## 2014-04-11 DIAGNOSIS — R0781 Pleurodynia: Secondary | ICD-10-CM

## 2014-04-11 DIAGNOSIS — I10 Essential (primary) hypertension: Secondary | ICD-10-CM | POA: Diagnosis not present

## 2014-04-11 NOTE — Progress Notes (Signed)
Pre visit review using our clinic review tool, if applicable. No additional management support is needed unless otherwise documented below in the visit note. 

## 2014-04-11 NOTE — Patient Instructions (Addendum)
Please continue all other medications as before, and refills have been done if requested.  Please have the pharmacy call with any other refills you may need.  Please continue your efforts at being more active, low cholesterol diet, and weight control.  Please keep your appointments with your specialists as you may have planned  Please return in 6 months, or sooner if needed 

## 2014-04-11 NOTE — Assessment & Plan Note (Signed)
O/w stable overall by history and exam, recent data reviewed with pt, and pt to continue medical treatment as before,  to f/u any worsening symptoms or concerns SpO2 Readings from Last 3 Encounters:  04/11/14 96%  04/08/14 98%  03/15/14 97%

## 2014-04-11 NOTE — Assessment & Plan Note (Signed)
Stable, cont pain control, no need further imaging at this time.  to f/u any worsening symptoms or concerns

## 2014-04-11 NOTE — Assessment & Plan Note (Signed)
stable overall by history and exam, recent data reviewed with pt, and pt to continue medical treatment as before,  to f/u any worsening symptoms or concerns BP Readings from Last 3 Encounters:  04/11/14 140/84  04/08/14 125/63  03/15/14 148/80

## 2014-04-11 NOTE — Progress Notes (Signed)
Subjective:    Patient ID: [name] [name], female    DOB: [birth year], 64 y.o.   MRN: [id number]  HPI Here to f/u after seen in ER after fall in yard, tripped with a misstep working on [place]; Pt denies chest pain except for persistent right pleuritic pain, increased sob or doe, wheezing, orthopnea, PND, increased LE swelling, palpitations, dizziness or syncope.  Pt denies new neurological symptoms such as new headache, or facial or extremity weakness or numbness   Pt denies polydipsia, polyuria.  S/p mult plain films and CT without acute .  No evidence for bleeding on coumadin, no overt bleeding since then.  Recent hgg 10.8, stable Past Medical History  Diagnosis Date  . Chronic rhinitis   . Morbid obesity     target wt=179lb for BMI<30 Peak wt 282lb  . History of DVT (deep vein thrombosis)   . COPD (chronic obstructive pulmonary disease)     [place]. PFTs 07/25/06 FEV1 55% ratio 53, DLC0 51% HFA 50% 12/16/2009  . Depression   . Osteoporosis     last BMD 4/08 -2.4, intolerant of bisphosphonates  . Hypertension   . Atrial fibrillation and flutter   . Anxiety   . Glucose intolerance (impaired glucose tolerance)     on steroids  . VITAMIN D DEFICIENCY   . HYPERLIPIDEMIA   . PREMATURE VENTRICULAR CONTRACTIONS   . Rhabdomyolysis 07/29/2009  . FRACTURE, RIB, RIGHT 06/18/2009  . PULMONARY EMBOLISM, HX OF 06/19/2007  . Tremor   . Hx of cardiac catheterization     LHC (01/2001):  Normal Cors.  EF 65%.;  LexiScan Myoview (03/2013): No ischemia, EF 61%, normal  . Hx of echocardiogram     Echo (11/2011):  Mod LVH, EF 60-65%, no RWMA, MAC, mild LAE, PASP 34.  . Asthma   . CHF (congestive heart failure)    Past Surgical History  Procedure Laterality Date  . Abdominal hysterectomy    . Tonsillectomy    . Skin graft to middle r finger  1975  . S/p left arm fracture with fall off chair    . Colonoscopy  10/22/2003, ?2006    reports that she quit smoking about 18 years ago. Her smoking use  included Cigarettes. She has a 35 pack-year smoking history. She has never used smokeless tobacco. She reports that she does not drink alcohol or use illicit drugs. family history includes Asthma in her brother and son; Heart disease in her mother; Throat cancer in her brother. Allergies  Allergen Reactions  . Duloxetine Other (See Comments)    REACTION: rhabdomyolysis  . Penicillins Other (See Comments)    SYNCOPE  . Latex Rash   Current Outpatient Prescriptions on File Prior to Visit  Medication Sig Dispense Refill  . Aclidinium Bromide (TUDORZA PRESSAIR) 400 MCG/ACT AEPB Inhale 1 puff into the lungs 2 (two) times daily.    Marland Kitchen albuterol (PROAIR HFA) 108 (90 BASE) MCG/ACT inhaler Inhale 2 puffs into the lungs every 4 (four) hours as needed for wheezing or shortness of breath.     Marland Kitchen atorvastatin (LIPITOR) 10 MG tablet Take 10 mg by mouth at bedtime.     . Calcium Carbonate-Vitamin D 600-400 MG-UNIT per tablet Take 1 tablet by mouth 2 (two) times daily.      Marland Kitchen CARTIA XT 180 MG 24 hr capsule TAKE 1 CAPSULE DAILY (Patient taking differently: TAKE 1 CAPSULE BY MOUTH DAILY) 90 capsule 2  . cetirizine (ZYRTEC) 10 MG tablet Take 10 mg by  mouth at bedtime.     . cholecalciferol (VITAMIN D) 1000 UNITS tablet Take 1,000 Units by mouth every morning.    Marland Kitchen Dextromethorphan-Guaifenesin (MUCINEX DM MAXIMUM STRENGTH) 60-1200 MG TB12 Take 1 tablet by mouth every 12 (twelve) hours as needed (cough/congestion).    Marland Kitchen doxycycline (VIBRA-TABS) 100 MG tablet Take 1 tablet (100 mg total) by mouth 2 (two) times daily. 14 tablet 0  . FLUoxetine (PROZAC) 40 MG capsule TAKE 1 CAPSULE DAILY (Patient taking differently: TAKE 1 CAPSULE BY MOUTH DAILY) 90 capsule 3  . fluticasone (FLONASE) 50 MCG/ACT nasal spray Place 2 sprays into both nostrils daily. 16 g 5  . furosemide (LASIX) 80 MG tablet Take 80 mg by mouth 2 (two) times daily. May take 1 extra daily as needed w/ Klor Con for leg swelling    . furosemide (LASIX) 80 MG  tablet TAKE 1 TABLET TWICE A DAY 180 tablet 3  . glucosamine-chondroitin 500-400 MG tablet Take 1 tablet by mouth every morning.     Marland Kitchen HYDROcodone-homatropine (HYCODAN) 5-1.5 MG/5ML syrup Take 5 mLs by mouth at bedtime as needed for cough. 118 mL 0  . KLOR-CON M20 20 MEQ tablet Take 1 tablet (20 mEq total) by mouth 2 (two) times daily. 180 tablet 3  . levalbuterol (XOPENEX) 1.25 MG/3ML nebulizer solution Take 1.25 mg by nebulization every 4 (four) hours as needed for wheezing or shortness of breath.    . mometasone (NASONEX) 50 MCG/ACT nasal spray Place 2 sprays into the nose 2 (two) times daily as needed (For nasal congestion.).     Marland Kitchen omeprazole (PRILOSEC) 20 MG capsule 2 tab by mouth per day (Patient taking differently: Take 40 mg by mouth daily. 2 tab by mouth per day) 180 capsule 3  . predniSONE (DELTASONE) 10 MG tablet Take  4 each am x 2 days,   2 each am x 2 days,  1 each am x 2 days and stop 14 tablet 0  . propafenone (RYTHMOL SR) 325 MG 12 hr capsule Take 1 capsule (325 mg total) by mouth 2 (two) times daily. 180 capsule 3  . sodium chloride (OCEAN) 0.65 % SOLN nasal spray Place 2 sprays into both nostrils 2 (two) times daily as needed for congestion.     . SYMBICORT 160-4.5 MCG/ACT inhaler INHALE 2 PUFFS INTO THE LUNGS TWICE A DAY 3 Inhaler 1  . warfarin (COUMADIN) 7.5 MG tablet TAKE AS DIRECTED BY COUMADIN CLINIC 120 tablet 0  . nitroGLYCERIN (NITROSTAT) 0.4 MG SL tablet Place 1 tablet (0.4 mg total) under the tongue every 5 (five) minutes as needed for chest pain. 25 tablet 3   No current facility-administered medications on file prior to visit.   Review of Systems  Constitutional: Negative for unusual diaphoresis or night sweats HENT: Negative for ringing in ear or discharge Eyes: Negative for double vision or worsening visual disturbance.  Respiratory: Negative for choking and stridor.   Gastrointestinal: Negative for vomiting or other signifcant bowel change Genitourinary:  Negative for hematuria or change in urine volume.  Musculoskeletal: Negative for other MSK pain or swelling Skin: Negative for color change and worsening wound.  Neurological: Negative for tremors and numbness other than noted  Psychiatric/Behavioral: Negative for decreased concentration or agitation other than above       Objective:   Physical Exam BP 140/84 mmHg  Pulse 88  Temp(Src) 97.8 F (36.6 C) (Oral)  Resp 18  Ht 5\' 4"  (1.626 m)  Wt 275 lb 1.9 oz (124.794 kg)  BMI 47.20 kg/m2  SpO2 96% VS noted,  Constitutional: Pt appears in no significant distress HENT: Head: NCAT.  Right Ear: External ear normal.  Left Ear: External ear normal.  Eyes: . Pupils are equal, round, and reactive to light. Conjunctivae and EOM are normal Neck: Normal range of motion. Neck supple.  Cardiovascular: Normal rate and regular rhythm.   Pulmonary/Chest: Effort normal and breath sounds without rales or wheezing. except for few right rales with splinting Abd:  Soft, NT, ND, + BS except tende right lower ant rib cage wihtout bruising Neurological: Pt is alert. Not confused , motor grossly intact Skin: Skin is warm. No rash, no LE edema Psychiatric: Pt behavior is normal. No agitation.     Assessment & Plan:

## 2014-04-22 ENCOUNTER — Other Ambulatory Visit: Payer: Self-pay | Admitting: Physician Assistant

## 2014-04-22 ENCOUNTER — Other Ambulatory Visit: Payer: Self-pay | Admitting: Internal Medicine

## 2014-04-26 ENCOUNTER — Other Ambulatory Visit: Payer: Self-pay

## 2014-04-26 ENCOUNTER — Encounter: Payer: Self-pay | Admitting: Internal Medicine

## 2014-04-26 MED ORDER — KLOR-CON M20 20 MEQ PO TBCR: 20.0000 meq | EXTENDED_RELEASE_TABLET | Freq: Two times a day (BID) | ORAL | Status: DC

## 2014-04-26 NOTE — Telephone Encounter (Signed)
cherina to see above

## 2014-04-26 NOTE — Telephone Encounter (Signed)
Medication has been sent to pharmacy.  °

## 2014-05-08 ENCOUNTER — Ambulatory Visit (INDEPENDENT_AMBULATORY_CARE_PROVIDER_SITE_OTHER): Payer: BLUE CROSS/BLUE SHIELD

## 2014-05-08 DIAGNOSIS — I4892 Unspecified atrial flutter: Secondary | ICD-10-CM

## 2014-05-08 DIAGNOSIS — I4891 Unspecified atrial fibrillation: Secondary | ICD-10-CM | POA: Diagnosis not present

## 2014-05-08 DIAGNOSIS — Z5181 Encounter for therapeutic drug level monitoring: Secondary | ICD-10-CM

## 2014-05-08 DIAGNOSIS — Z7901 Long term (current) use of anticoagulants: Secondary | ICD-10-CM

## 2014-05-08 DIAGNOSIS — Z86718 Personal history of other venous thrombosis and embolism: Secondary | ICD-10-CM

## 2014-05-08 DIAGNOSIS — Z8672 Personal history of thrombophlebitis: Secondary | ICD-10-CM

## 2014-05-08 LAB — POCT INR: INR: 2.4

## 2014-05-08 NOTE — Progress Notes (Signed)
Pre-visit discussion using our clinic review tool. No additional management support is needed unless otherwise documented below in the visit note.  

## 2014-05-08 NOTE — Progress Notes (Signed)
Agree with plan 

## 2014-05-10 ENCOUNTER — Other Ambulatory Visit: Payer: Self-pay | Admitting: Internal Medicine

## 2014-05-13 ENCOUNTER — Telehealth: Payer: Self-pay | Admitting: Internal Medicine

## 2014-05-13 MED ORDER — ACLIDINIUM BROMIDE 400 MCG/ACT IN AEPB: 1.0000 | INHALATION_SPRAY | Freq: Two times a day (BID) | RESPIRATORY_TRACT | Status: DC

## 2014-05-13 NOTE — Telephone Encounter (Signed)
Per 09/11/13 OV: Pulmonary follow up is as needed --   Pt is calling requesting refill on tudorza. Pt has not been seen since 09/11/13. No pending appt. Does pt need OV for refill or call and get this from PCP? Please advise MW thanks

## 2014-05-13 NOTE — Telephone Encounter (Signed)
Plan was f/u prn so we can refill resp meds through 09/2014 but after that all refills per primary care or she can see [name] Np but if she does so must bring all active meds with her for new med calendar  (ie she and dr Jenny Reichmann work out their own system or use ours so either way we maintain med reconciliation which has been a challenge in the past)

## 2014-05-13 NOTE — Telephone Encounter (Signed)
Pt is aware [facility]. Nothing further needed

## 2014-05-28 ENCOUNTER — Encounter: Payer: Self-pay | Admitting: Internal Medicine

## 2014-05-28 ENCOUNTER — Ambulatory Visit (INDEPENDENT_AMBULATORY_CARE_PROVIDER_SITE_OTHER): Payer: BLUE CROSS/BLUE SHIELD | Admitting: General Practice

## 2014-05-28 ENCOUNTER — Ambulatory Visit (INDEPENDENT_AMBULATORY_CARE_PROVIDER_SITE_OTHER): Payer: BLUE CROSS/BLUE SHIELD | Admitting: Internal Medicine

## 2014-05-28 VITALS — BP 138/78 | HR 87 | Temp 97.9°F | Resp 18 | Wt 271.0 lb

## 2014-05-28 DIAGNOSIS — I4892 Unspecified atrial flutter: Secondary | ICD-10-CM

## 2014-05-28 DIAGNOSIS — Z8672 Personal history of thrombophlebitis: Secondary | ICD-10-CM

## 2014-05-28 DIAGNOSIS — Z7901 Long term (current) use of anticoagulants: Secondary | ICD-10-CM

## 2014-05-28 DIAGNOSIS — J441 Chronic obstructive pulmonary disease with (acute) exacerbation: Secondary | ICD-10-CM

## 2014-05-28 DIAGNOSIS — R195 Other fecal abnormalities: Secondary | ICD-10-CM

## 2014-05-28 DIAGNOSIS — J440 Chronic obstructive pulmonary disease with acute lower respiratory infection: Secondary | ICD-10-CM

## 2014-05-28 DIAGNOSIS — Z5181 Encounter for therapeutic drug level monitoring: Secondary | ICD-10-CM

## 2014-05-28 DIAGNOSIS — Z86718 Personal history of other venous thrombosis and embolism: Secondary | ICD-10-CM

## 2014-05-28 DIAGNOSIS — J209 Acute bronchitis, unspecified: Secondary | ICD-10-CM

## 2014-05-28 LAB — POCT INR: INR: 3.5

## 2014-05-28 MED ORDER — PREDNISONE 20 MG PO TABS: 20.0000 mg | ORAL_TABLET | Freq: Two times a day (BID) | ORAL | Status: DC

## 2014-05-28 MED ORDER — DOXYCYCLINE HYCLATE 100 MG PO TABS: 100.0000 mg | ORAL_TABLET | Freq: Two times a day (BID) | ORAL | Status: DC

## 2014-05-28 NOTE — Progress Notes (Signed)
   Subjective:    Patient ID: [name] [name], female    DOB: July 25, 1950, 64 y.o.   MRN: [id number]  HPI  Her symptoms began over the weekend 05/25/14 with a cough which was productive of yellow sputum. The cough has been progressive and is awakening her. She's had associated body aches and nausea without vomiting. She describes nasal congestion and obstruction. She has frontal and maxillary area discomfort and pain. Other symptoms include fatigue and postnasal drainage.   She now has watery and loose stool. She has no other GI symptoms.  She's been using Symbicort 1 puff twice a day and Xopenex twice a day.  She is on oxygen continuously 2.5-3 liters.  She quit smoking in 1998 after 35 pack years.  She did have the flu vaccine.  Pneumonia vaccines are up-to-date. She is followed by Dr. Melvyn Novas, pulmonologist.  PT/INR 2.4 on 4/27   Review of Systems  She has had low-grade fever, chills, and sweats.  She also describes decreased appetite.  Her dyspnea is essentially stable and occurs at rest as well as with exertion. She does have intermittent dysphagia and is on a PPI. She has no weight loss, melena, rectal bleeding.     Objective:   Physical Exam Pertinent or positive findings include: She appears somewhat chronically ill. She is wearing nasal oxygen.  There is erythema of the nasal mucosa, particularly on the left Tympanic membranes are mildly erythematous without exudate.  Heart sounds are distant. There is suggestion of a grade 9/4-7 systolic murmur.  She has diffuse low-grade musical wheezing in all lung fields which is homogenous.  Abdomen is protuberant and quiet.  General appearance :adequately nourished; in no distress. Eyes: No conjunctival inflammation or scleral icterus is present. Oral exam:  Lips and gums are healthy appearing.There is no oropharyngeal erythema or exudate noted. Dental hygiene is good. Heart:  Normal rate and regular rhythm. S1 and S2 normal without  gallop,  click, rub or other extra sounds   Abdomen: bowel sounds decreased w/o ileus; soft and non-tender without masses, organomegaly or hernias noted.  No guarding or rebound.  Vascular : all pulses equal ; no bruits present. Skin:Warm & dry.  Intact without suspicious lesions or rashes ; no tenting or jaundice  Lymphatic: No lymphadenopathy is noted about the head, neck, axilla.  Neuro: Strength, tone  normal.       Assessment & Plan:  #1 COPD exacerbation with asthmatic component #2 loose -watery stool  Plan: See orders and recommendations.Check PT/INR today

## 2014-05-28 NOTE — Progress Notes (Signed)
Pre visit review using our clinic review tool, if applicable. No additional management support is needed unless otherwise documented below in the visit note. 

## 2014-05-28 NOTE — Progress Notes (Signed)
Agree with plan 

## 2014-05-28 NOTE — Patient Instructions (Addendum)
Symbicort two inhalations every 12 hours; gargle and spit after use  Please take a probiotic , Florastor OR Align, every day if the bowels are loose. This will replace the normal bacteria which  are necessary for formation of normal stool and processing of food.  PT/INR today here

## 2014-06-04 ENCOUNTER — Ambulatory Visit (INDEPENDENT_AMBULATORY_CARE_PROVIDER_SITE_OTHER): Payer: BLUE CROSS/BLUE SHIELD | Admitting: Internal Medicine

## 2014-06-04 ENCOUNTER — Encounter: Payer: Self-pay | Admitting: Internal Medicine

## 2014-06-04 ENCOUNTER — Ambulatory Visit (INDEPENDENT_AMBULATORY_CARE_PROVIDER_SITE_OTHER): Payer: BLUE CROSS/BLUE SHIELD | Admitting: General Practice

## 2014-06-04 VITALS — BP 140/78 | HR 72 | Temp 98.4°F | Wt 274.0 lb

## 2014-06-04 DIAGNOSIS — I4892 Unspecified atrial flutter: Secondary | ICD-10-CM

## 2014-06-04 DIAGNOSIS — I4891 Unspecified atrial fibrillation: Secondary | ICD-10-CM | POA: Diagnosis not present

## 2014-06-04 DIAGNOSIS — Z5181 Encounter for therapeutic drug level monitoring: Secondary | ICD-10-CM

## 2014-06-04 DIAGNOSIS — Z86718 Personal history of other venous thrombosis and embolism: Secondary | ICD-10-CM

## 2014-06-04 DIAGNOSIS — Z7901 Long term (current) use of anticoagulants: Secondary | ICD-10-CM

## 2014-06-04 DIAGNOSIS — J449 Chronic obstructive pulmonary disease, unspecified: Secondary | ICD-10-CM

## 2014-06-04 DIAGNOSIS — Z8672 Personal history of thrombophlebitis: Secondary | ICD-10-CM

## 2014-06-04 DIAGNOSIS — R7302 Impaired glucose tolerance (oral): Secondary | ICD-10-CM

## 2014-06-04 DIAGNOSIS — I10 Essential (primary) hypertension: Secondary | ICD-10-CM | POA: Diagnosis not present

## 2014-06-04 LAB — POCT INR: INR: 2.6

## 2014-06-04 NOTE — Progress Notes (Signed)
I have reviewed and agree with the plan. 

## 2014-06-04 NOTE — Progress Notes (Signed)
Pre visit review using our clinic review tool, if applicable. No additional management support is needed unless otherwise documented below in the visit note. 

## 2014-06-04 NOTE — Assessment & Plan Note (Signed)
stable overall by history and exam, recent data reviewed with pt, and pt to continue medical treatment as before,  to f/u any worsening symptoms or concerns BP Readings from Last 3 Encounters:  06/04/14 140/78  05/28/14 138/78  04/11/14 140/84

## 2014-06-04 NOTE — Assessment & Plan Note (Signed)
stable overall by history and exam, recent data reviewed with pt, and pt to continue medical treatment as before,  to f/u any worsening symptoms or concerns SpO2 Readings from Last 3 Encounters:  06/04/14 95%  05/28/14 97%  04/11/14 96%

## 2014-06-04 NOTE — Patient Instructions (Signed)
Please continue all other medications as before, and refills have been done if requested.  Please have the pharmacy call with any other refills you may need.  Please continue your efforts at being more active, low cholesterol diet, and weight control.  You are otherwise up to date with prevention measures today.  Please keep your appointments with your specialists as you may have planned  Your Form for FMLA for your spouse was filled out today

## 2014-06-04 NOTE — Progress Notes (Signed)
Subjective:    Patient ID: [name] [name], female    DOB: 02/15/1950, 64 y.o.   MRN: [id number]  HPI  Here to f/u after seen last wk per Dr Linna Darner with [facility]   overall doing ok,  Pt denies chest pain, increasing sob or doe, wheezing, orthopnea, PND, increased LE swelling, palpitations, dizziness or syncope.  Pt denies new  neurological symptoms such as new headache, or facial or extremity weakness or numbness.  Pt denies polydipsia, polyuria, or low sugar episode.   Pt denies new neurological symptoms such as new headache, or facial or extremity weakness or numbness.Overall improved from last wk, cough/pulm infection seems overall improved, thinks she got from husband, non prod cough, no worsening wheezing or tightness.  Does have several wks ongoing nasal allergy symptoms with clearish congestion, itch and sneezing, without fever, pain, ST, cough, swelling or wheezing. Still with some deep cough but less freq, and pressure to right ear.  Pt denies fever, wt loss, night sweats, loss of appetite, or other constitutional symptoms  Fu INR 2.7 , no overt bleeding or bruising, except to left ant mid arm with a fall 2 days ago Past Medical History  Diagnosis Date  . Chronic rhinitis   . Morbid obesity     target wt=179lb for BMI<30 Peak wt 282lb  . History of DVT (deep vein thrombosis)   . COPD (chronic obstructive pulmonary disease)     Wert. PFTs 07/25/06 FEV1 55% ratio 53, DLC0 51% HFA 50% 12/16/2009  . Depression   . Osteoporosis     last BMD 4/08 -2.4, intolerant of bisphosphonates  . Hypertension   . Atrial fibrillation and flutter   . Anxiety   . Glucose intolerance (impaired glucose tolerance)     on steroids  . VITAMIN D DEFICIENCY   . HYPERLIPIDEMIA   . PREMATURE VENTRICULAR CONTRACTIONS   . Rhabdomyolysis 07/29/2009  . FRACTURE, RIB, RIGHT 06/18/2009  . PULMONARY EMBOLISM, HX OF 06/19/2007  . Tremor   . Hx of cardiac catheterization     LHC (01/2001):  Normal Cors.  EF 65%.;  LexiScan  Myoview (03/2013): No ischemia, EF 61%, normal  . Hx of echocardiogram     Echo (11/2011):  Mod LVH, EF 60-65%, no RWMA, MAC, mild LAE, PASP 34.  . Asthma   . CHF (congestive heart failure)    Past Surgical History  Procedure Laterality Date  . Abdominal hysterectomy    . Tonsillectomy    . Skin graft to middle r finger  1975  . S/p left arm fracture with fall off chair    . Colonoscopy  10/22/2003, ?2006    reports that she quit smoking about 18 years ago. Her smoking use included Cigarettes. She has a 35 pack-year smoking history. She has never used smokeless tobacco. She reports that she does not drink alcohol or use illicit drugs. family history includes Asthma in her brother and son; Heart disease in her mother; Throat cancer in her brother. Allergies  Allergen Reactions  . Duloxetine Other (See Comments)    REACTION: rhabdomyolysis  . Penicillins Other (See Comments)    SYNCOPE  . Latex Rash   Current Outpatient Prescriptions on File Prior to Visit  Medication Sig Dispense Refill  . Aclidinium Bromide (TUDORZA PRESSAIR) 400 MCG/ACT AEPB Inhale 1 puff into the lungs 2 (two) times daily. 3 each 1  . atorvastatin (LIPITOR) 10 MG tablet TAKE 1 TABLET DAILY 90 tablet 2  . Calcium Carbonate-Vitamin D 600-400 MG-UNIT  per tablet Take 1 tablet by mouth 2 (two) times daily.      Marland Kitchen CARTIA XT 180 MG 24 hr capsule TAKE 1 CAPSULE DAILY (Patient taking differently: TAKE 1 CAPSULE BY MOUTH DAILY) 90 capsule 2  . cetirizine (ZYRTEC) 10 MG tablet Take 10 mg by mouth at bedtime.     . cholecalciferol (VITAMIN D) 1000 UNITS tablet Take 1,000 Units by mouth every morning.    Marland Kitchen Dextromethorphan-Guaifenesin (MUCINEX DM MAXIMUM STRENGTH) 60-1200 MG TB12 Take 1 tablet by mouth every 12 (twelve) hours as needed (cough/congestion).    Marland Kitchen doxycycline (VIBRA-TABS) 100 MG tablet Take 1 tablet (100 mg total) by mouth 2 (two) times daily. 20 tablet 0  . FLUoxetine (PROZAC) 40 MG capsule TAKE 1 CAPSULE DAILY  (Patient taking differently: TAKE 1 CAPSULE BY MOUTH DAILY) 90 capsule 3  . fluticasone (FLONASE) 50 MCG/ACT nasal spray Place 2 sprays into both nostrils daily. 16 g 5  . furosemide (LASIX) 80 MG tablet Take 80 mg by mouth 2 (two) times daily. May take 1 extra daily as needed w/ Klor Con for leg swelling    . furosemide (LASIX) 80 MG tablet TAKE 1 TABLET TWICE A DAY 180 tablet 3  . glucosamine-chondroitin 500-400 MG tablet Take 1 tablet by mouth every morning.     Marland Kitchen KLOR-CON M20 20 MEQ tablet Take 1 tablet (20 mEq total) by mouth 2 (two) times daily. 20 tablet 0  . levalbuterol (XOPENEX) 1.25 MG/3ML nebulizer solution Take 1.25 mg by nebulization every 4 (four) hours as needed for wheezing or shortness of breath.    . mometasone (NASONEX) 50 MCG/ACT nasal spray Place 2 sprays into the nose 2 (two) times daily as needed (For nasal congestion.).     Marland Kitchen omeprazole (PRILOSEC) 20 MG capsule 2 tab by mouth per day (Patient taking differently: Take 40 mg by mouth daily. 2 tab by mouth per day) 180 capsule 3  . PROAIR HFA 108 (90 BASE) MCG/ACT inhaler INHALE 2 PUFFS EVERY FOUR HOURS AS NEEDED 3 each 0  . propafenone (RYTHMOL SR) 325 MG 12 hr capsule TAKE 1 CAPSULE TWICE A DAY 180 capsule 2  . sodium chloride (OCEAN) 0.65 % SOLN nasal spray Place 2 sprays into both nostrils 2 (two) times daily as needed for congestion.     . SYMBICORT 160-4.5 MCG/ACT inhaler USE 2 INHALATIONS TWICE A DAY 3 Inhaler 0  . warfarin (COUMADIN) 7.5 MG tablet TAKE AS DIRECTED BY COUMADIN CLINIC 120 tablet 0  . predniSONE (DELTASONE) 20 MG tablet Take 1 tablet (20 mg total) by mouth 2 (two) times daily. (Patient not taking: Reported on 06/04/2014) 14 tablet 0   No current facility-administered medications on file prior to visit.    Review of Systems  Constitutional: Negative for unusual diaphoresis or night sweats HENT: Negative for ringing in ear or discharge Eyes: Negative for double vision or worsening visual disturbance.    Respiratory: Negative for choking and stridor.   Gastrointestinal: Negative for vomiting or other signifcant bowel change Genitourinary: Negative for hematuria or change in urine volume.  Musculoskeletal: Negative for other MSK pain or swelling Skin: Negative for color change and worsening wound.  Neurological: Negative for tremors and numbness other than noted  Psychiatric/Behavioral: Negative for decreased concentration or agitation other than above       Objective:   Physical Exam BP 140/78 mmHg  Pulse 72  Temp(Src) 98.4 F (36.9 C) (Oral)  Wt 274 lb (124.286 kg)  SpO2 95% VS  noted,  Constitutional: Pt appears in no significant distress HENT: Head: NCAT.  Right Ear: External ear normal.  Left Ear: External ear normal.  Eyes: . Pupils are equal, round, and reactive to light. Conjunctivae and EOM are normal Neck: Normal range of motion. Neck supple.  Cardiovascular: Normal rate and regular rhythm.   Pulmonary/Chest: Effort normal and breath sounds  decresased without rales or wheezing.  Abd:  Soft, NT, ND, + BS Neurological: Pt is alert. Not confused , motor grossly intact Skin: Skin is warm. No rash, no LE edema Psychiatric: Pt behavior is normal. No agitation.         Assessment & Plan:

## 2014-06-04 NOTE — Assessment & Plan Note (Signed)
stable overall by history and exam, recent data reviewed with pt, and pt to continue medical treatment as before,  to f/u any worsening symptoms or concerns Lab Results  Component Value Date   HGBA1C 5.2 01/31/2014

## 2014-06-19 ENCOUNTER — Ambulatory Visit: Payer: BLUE CROSS/BLUE SHIELD

## 2014-07-16 ENCOUNTER — Ambulatory Visit (INDEPENDENT_AMBULATORY_CARE_PROVIDER_SITE_OTHER): Payer: BLUE CROSS/BLUE SHIELD | Admitting: General Practice

## 2014-07-16 DIAGNOSIS — Z8672 Personal history of thrombophlebitis: Secondary | ICD-10-CM

## 2014-07-16 DIAGNOSIS — I4891 Unspecified atrial fibrillation: Secondary | ICD-10-CM | POA: Diagnosis not present

## 2014-07-16 DIAGNOSIS — I4892 Unspecified atrial flutter: Secondary | ICD-10-CM

## 2014-07-16 DIAGNOSIS — Z5181 Encounter for therapeutic drug level monitoring: Secondary | ICD-10-CM

## 2014-07-16 DIAGNOSIS — Z86718 Personal history of other venous thrombosis and embolism: Secondary | ICD-10-CM | POA: Diagnosis not present

## 2014-07-16 DIAGNOSIS — Z7901 Long term (current) use of anticoagulants: Secondary | ICD-10-CM

## 2014-07-16 LAB — POCT INR: INR: 1.9

## 2014-07-16 NOTE — Progress Notes (Signed)
Pre visit review using our clinic review tool, if applicable. No additional management support is needed unless otherwise documented below in the visit note. 

## 2014-07-16 NOTE — Progress Notes (Signed)
I have reviewed and agree with the plan. 

## 2014-07-22 ENCOUNTER — Telehealth: Payer: Self-pay | Admitting: Internal Medicine

## 2014-07-22 ENCOUNTER — Other Ambulatory Visit: Payer: Self-pay | Admitting: Internal Medicine

## 2014-07-22 MED ORDER — KLOR-CON M20 20 MEQ PO TBCR: 20.0000 meq | EXTENDED_RELEASE_TABLET | Freq: Two times a day (BID) | ORAL | Status: DC

## 2014-07-22 NOTE — Telephone Encounter (Signed)
Pt advised via home VM that other 2 medications are Rx'd by Cardiology

## 2014-07-22 NOTE — Telephone Encounter (Signed)
In addition to klor-con, patient is also reqeusting [facility]... furosemide (LASIX) 80 MG tablet [[id number]]  propafenone (RYTHMOL SR) 325 MG 12 hr capsule [[id number]]

## 2014-07-24 ENCOUNTER — Other Ambulatory Visit: Payer: Self-pay

## 2014-07-24 MED ORDER — KLOR-CON M20 20 MEQ PO TBCR: 20.0000 meq | EXTENDED_RELEASE_TABLET | Freq: Two times a day (BID) | ORAL | Status: DC

## 2014-08-08 ENCOUNTER — Other Ambulatory Visit: Payer: Self-pay | Admitting: Internal Medicine

## 2014-08-13 ENCOUNTER — Other Ambulatory Visit: Payer: Self-pay | Admitting: General Practice

## 2014-08-13 ENCOUNTER — Ambulatory Visit (INDEPENDENT_AMBULATORY_CARE_PROVIDER_SITE_OTHER): Payer: BLUE CROSS/BLUE SHIELD | Admitting: General Practice

## 2014-08-13 DIAGNOSIS — Z5181 Encounter for therapeutic drug level monitoring: Secondary | ICD-10-CM

## 2014-08-13 DIAGNOSIS — Z7901 Long term (current) use of anticoagulants: Secondary | ICD-10-CM

## 2014-08-13 DIAGNOSIS — I4891 Unspecified atrial fibrillation: Secondary | ICD-10-CM

## 2014-08-13 DIAGNOSIS — Z8672 Personal history of thrombophlebitis: Secondary | ICD-10-CM

## 2014-08-13 DIAGNOSIS — Z86718 Personal history of other venous thrombosis and embolism: Secondary | ICD-10-CM

## 2014-08-13 DIAGNOSIS — I4892 Unspecified atrial flutter: Secondary | ICD-10-CM

## 2014-08-13 LAB — POCT INR: INR: 2

## 2014-08-13 MED ORDER — WARFARIN SODIUM 7.5 MG PO TABS: ORAL_TABLET | ORAL | Status: DC

## 2014-08-13 NOTE — Progress Notes (Signed)
Pre visit review using our clinic review tool, if applicable. No additional management support is needed unless otherwise documented below in the visit note. 

## 2014-08-13 NOTE — Progress Notes (Signed)
I have reviewed and agree with the plan. 

## 2014-09-03 ENCOUNTER — Telehealth: Payer: Self-pay | Admitting: Internal Medicine

## 2014-09-03 ENCOUNTER — Encounter: Payer: Self-pay | Admitting: Internal Medicine

## 2014-09-03 ENCOUNTER — Telehealth: Payer: Self-pay | Admitting: *Deleted

## 2014-09-03 ENCOUNTER — Ambulatory Visit (INDEPENDENT_AMBULATORY_CARE_PROVIDER_SITE_OTHER): Payer: BLUE CROSS/BLUE SHIELD | Admitting: Internal Medicine

## 2014-09-03 VITALS — BP 146/82 | HR 85 | Temp 98.4°F | Ht 64.0 in | Wt 278.0 lb

## 2014-09-03 DIAGNOSIS — J441 Chronic obstructive pulmonary disease with (acute) exacerbation: Secondary | ICD-10-CM | POA: Diagnosis not present

## 2014-09-03 DIAGNOSIS — J019 Acute sinusitis, unspecified: Secondary | ICD-10-CM

## 2014-09-03 DIAGNOSIS — I10 Essential (primary) hypertension: Secondary | ICD-10-CM | POA: Diagnosis not present

## 2014-09-03 MED ORDER — HYDROCODONE-HOMATROPINE 5-1.5 MG/5ML PO SYRP: 5.0000 mL | ORAL_SOLUTION | Freq: Four times a day (QID) | ORAL | Status: DC | PRN

## 2014-09-03 MED ORDER — LEVOFLOXACIN 250 MG PO TABS: 250.0000 mg | ORAL_TABLET | Freq: Every day | ORAL | Status: DC

## 2014-09-03 MED ORDER — PREDNISONE 10 MG PO TABS: 10.0000 mg | ORAL_TABLET | Freq: Every day | ORAL | Status: DC

## 2014-09-03 MED ORDER — PREDNISONE 10 MG PO TABS: 10.0000 mg | ORAL_TABLET | Freq: Two times a day (BID) | ORAL | Status: DC

## 2014-09-03 MED ORDER — ATORVASTATIN CALCIUM 10 MG PO TABS: 10.0000 mg | ORAL_TABLET | Freq: Every day | ORAL | Status: DC

## 2014-09-03 NOTE — Assessment & Plan Note (Signed)
Mild, for prednsione 20 qd x 7 days, .followh

## 2014-09-03 NOTE — Assessment & Plan Note (Signed)
BP Readings from Last 3 Encounters:  09/03/14 146/82  06/04/14 140/78  05/28/14 138/78   Mild elevated, but ow stable overall by history and exam, recent data reviewed with pt, and pt to continue medical treatment as before,  to f/u any worsening symptoms or concerns

## 2014-09-03 NOTE — Assessment & Plan Note (Signed)
Mild to mod, for antibx course,  to f/u any worsening symptoms or concerns 

## 2014-09-03 NOTE — Telephone Encounter (Signed)
Please call [facility] regarding the instructions that were on the prescription for Prednisone

## 2014-09-03 NOTE — Patient Instructions (Signed)
Please take all new medication as prescribed - the antibiotic, cough medicine and lower dose prednisone  /Please continue all other medications as before, and refills have been done if requested.  Please have the pharmacy call with any other refills you may need.  Please keep your appointments with your specialists as you may have planned

## 2014-09-03 NOTE — Telephone Encounter (Signed)
Pt had left msg with triage resent antibiotic & prednisone to [facility]...[name] [name]

## 2014-09-03 NOTE — Progress Notes (Signed)
Subjective:    Patient ID: [name] [name], female    DOB: October 10, 1950, 64 y.o.   MRN: [id number]  HPI   Here with 2-3 days acute onset fever, facial pain, pressure, headache, general weakness and malaise, and greenish d/c, with mild ST and cough, but pt denies chest pain, wheezing, increased sob or doe, orthopnea, PND, increased LE swelling, palpitations, dizziness or syncope, except for onset mild wheezing last PM with sob.  Denies worsening depressive symptoms, suicidal ideation, or panic; has ongoing anxiety, not increased recently.  Past Medical History  Diagnosis Date  . Chronic rhinitis   . Morbid obesity     target wt=179lb for BMI<30 Peak wt 282lb  . History of DVT (deep vein thrombosis)   . COPD (chronic obstructive pulmonary disease)     Wert. PFTs 07/25/06 FEV1 55% ratio 53, DLC0 51% HFA 50% 12/16/2009  . Depression   . Osteoporosis     last BMD 4/08 -2.4, intolerant of bisphosphonates  . Hypertension   . Atrial fibrillation and flutter   . Anxiety   . Glucose intolerance (impaired glucose tolerance)     on steroids  . VITAMIN D DEFICIENCY   . HYPERLIPIDEMIA   . PREMATURE VENTRICULAR CONTRACTIONS   . Rhabdomyolysis 07/29/2009  . FRACTURE, RIB, RIGHT 06/18/2009  . PULMONARY EMBOLISM, HX OF 06/19/2007  . Tremor   . Hx of cardiac catheterization     LHC (01/2001):  Normal Cors.  EF 65%.;  LexiScan Myoview (03/2013): No ischemia, EF 61%, normal  . Hx of echocardiogram     Echo (11/2011):  Mod LVH, EF 60-65%, no RWMA, MAC, mild LAE, PASP 34.  . Asthma   . CHF (congestive heart failure)    Past Surgical History  Procedure Laterality Date  . Abdominal hysterectomy    . Tonsillectomy    . Skin graft to middle r finger  1975  . S/p left arm fracture with fall off chair    . Colonoscopy  10/22/2003, ?2006    reports that she quit smoking about 18 years ago. Her smoking use included Cigarettes. She has a 35 pack-year smoking history. She has never used smokeless tobacco. She  reports that she does not drink alcohol or use illicit drugs. family history includes Asthma in her brother and son; Heart disease in her mother; Throat cancer in her brother. Allergies  Allergen Reactions  . Duloxetine Other (See Comments)    REACTION: rhabdomyolysis  . Penicillins Other (See Comments)    SYNCOPE  . Latex Rash   Current Outpatient Prescriptions on File Prior to Visit  Medication Sig Dispense Refill  . Aclidinium Bromide (TUDORZA PRESSAIR) 400 MCG/ACT AEPB Inhale 1 puff into the lungs 2 (two) times daily. 3 each 1  . Calcium Carbonate-Vitamin D 600-400 MG-UNIT per tablet Take 1 tablet by mouth 2 (two) times daily.      Marland Kitchen CARTIA XT 180 MG 24 hr capsule TAKE 1 CAPSULE DAILY (Patient taking differently: TAKE 1 CAPSULE BY MOUTH DAILY) 90 capsule 2  . cetirizine (ZYRTEC) 10 MG tablet Take 10 mg by mouth at bedtime.     . cholecalciferol (VITAMIN D) 1000 UNITS tablet Take 1,000 Units by mouth every morning.    Marland Kitchen Dextromethorphan-Guaifenesin (MUCINEX DM MAXIMUM STRENGTH) 60-1200 MG TB12 Take 1 tablet by mouth every 12 (twelve) hours as needed (cough/congestion).    Marland Kitchen doxycycline (VIBRA-TABS) 100 MG tablet Take 1 tablet (100 mg total) by mouth 2 (two) times daily. 20 tablet 0  .  FLUoxetine (PROZAC) 40 MG capsule TAKE 1 CAPSULE DAILY 90 capsule 1  . fluticasone (FLONASE) 50 MCG/ACT nasal spray Place 2 sprays into both nostrils daily. 16 g 5  . furosemide (LASIX) 80 MG tablet Take 80 mg by mouth 2 (two) times daily. May take 1 extra daily as needed w/ Klor Con for leg swelling    . furosemide (LASIX) 80 MG tablet TAKE 1 TABLET TWICE A DAY 180 tablet 3  . glucosamine-chondroitin 500-400 MG tablet Take 1 tablet by mouth every morning.     Marland Kitchen KLOR-CON M20 20 MEQ tablet Take 1 tablet (20 mEq total) by mouth 2 (two) times daily. 180 tablet 1  . levalbuterol (XOPENEX) 1.25 MG/3ML nebulizer solution Take 1.25 mg by nebulization every 4 (four) hours as needed for wheezing or shortness of  breath.    . mometasone (NASONEX) 50 MCG/ACT nasal spray Place 2 sprays into the nose 2 (two) times daily as needed (For nasal congestion.).     Marland Kitchen omeprazole (PRILOSEC) 20 MG capsule 2 tab by mouth per day (Patient taking differently: Take 40 mg by mouth daily. 2 tab by mouth per day) 180 capsule 3  . predniSONE (DELTASONE) 20 MG tablet Take 1 tablet (20 mg total) by mouth 2 (two) times daily. 14 tablet 0  . PROAIR HFA 108 (90 BASE) MCG/ACT inhaler INHALE 2 PUFFS EVERY FOUR HOURS AS NEEDED 3 each 0  . propafenone (RYTHMOL SR) 325 MG 12 hr capsule TAKE 1 CAPSULE TWICE A DAY 180 capsule 2  . sodium chloride (OCEAN) 0.65 % SOLN nasal spray Place 2 sprays into both nostrils 2 (two) times daily as needed for congestion.     . SYMBICORT 160-4.5 MCG/ACT inhaler USE 2 INHALATIONS TWICE A DAY 3 Inhaler 0  . warfarin (COUMADIN) 7.5 MG tablet TAKE AS DIRECTED BY COUMADIN CLINIC 120 tablet 1   No current facility-administered medications on file prior to visit.      Review of Systems  Constitutional: Negative for unusual diaphoresis or night sweats HENT: Negative for ringing in ear or discharge Eyes: Negative for double vision or worsening visual disturbance.  Respiratory: Negative for choking and stridor.   Gastrointestinal: Negative for vomiting or other signifcant bowel change Genitourinary: Negative for hematuria or change in urine volume.  Musculoskeletal: Negative for other MSK pain or swelling Skin: Negative for color change and worsening wound.  Neurological: Negative for tremors and numbness other than noted  Psychiatric/Behavioral: Negative for decreased concentration or agitation other than above       Objective:   Physical Exam BP 146/82 mmHg  Pulse 85  Temp(Src) 98.4 F (36.9 C) (Oral)  Ht 5\' 4"  (1.626 m)  Wt 278 lb (126.1 kg)  BMI 47.70 kg/m2  SpO2 96% VS noted,  Constitutional: Pt appears in no significant distress HENT: Head: NCAT.  Right Ear: External ear normal.  Left  Ear: External ear normal.  Bilat tm's with mild erythema.  Max sinus areas mild tender bilat maxillary.  Pharynx with mild erythema, no exudate Eyes: . Pupils are equal, round, and reactive to light. Conjunctivae and EOM are normal Neck: Normal range of motion. Neck supple. with tender bilat submandib LA Cardiovascular: Normal rate and regular rhythm.   Pulmonary/Chest: Effort normal and breath sounds decreased with few mild insp wheeze, and no rales Neurological: Pt is alert. Not confused , motor grossly intact Skin: Skin is warm. No rash, no LE edema Psychiatric: Pt behavior is normal. No agitation. mild nervous , not depressed  affect     Assessment & Plan:

## 2014-09-03 NOTE — Telephone Encounter (Signed)
Pt states she saw Dr. Jenny Reichmann this afternoon he [facility]. Inform pt will send to [facility]...Johny Chess

## 2014-09-03 NOTE — Progress Notes (Signed)
Pre visit review using our clinic review tool, if applicable. No additional management support is needed unless otherwise documented below in the visit note. 

## 2014-09-04 ENCOUNTER — Other Ambulatory Visit: Payer: Self-pay

## 2014-09-04 MED ORDER — DILTIAZEM HCL ER COATED BEADS 180 MG PO CP24: 180.0000 mg | ORAL_CAPSULE | Freq: Every day | ORAL | Status: DC

## 2014-09-05 ENCOUNTER — Ambulatory Visit: Payer: Medicare Other | Admitting: Internal Medicine

## 2014-09-10 ENCOUNTER — Ambulatory Visit (INDEPENDENT_AMBULATORY_CARE_PROVIDER_SITE_OTHER): Payer: BLUE CROSS/BLUE SHIELD | Admitting: General Practice

## 2014-09-10 DIAGNOSIS — Z5181 Encounter for therapeutic drug level monitoring: Secondary | ICD-10-CM

## 2014-09-10 DIAGNOSIS — I4892 Unspecified atrial flutter: Secondary | ICD-10-CM | POA: Diagnosis not present

## 2014-09-10 DIAGNOSIS — Z8672 Personal history of thrombophlebitis: Secondary | ICD-10-CM

## 2014-09-10 DIAGNOSIS — I4891 Unspecified atrial fibrillation: Secondary | ICD-10-CM

## 2014-09-10 DIAGNOSIS — Z7901 Long term (current) use of anticoagulants: Secondary | ICD-10-CM

## 2014-09-10 DIAGNOSIS — Z86718 Personal history of other venous thrombosis and embolism: Secondary | ICD-10-CM

## 2014-09-10 LAB — POCT INR: INR: 2.3

## 2014-09-10 NOTE — Progress Notes (Signed)
Pre visit review using our clinic review tool, if applicable. No additional management support is needed unless otherwise documented below in the visit note. 

## 2014-09-10 NOTE — Progress Notes (Signed)
I have reviewed and agree with the plan. 

## 2014-10-01 ENCOUNTER — Encounter: Payer: Self-pay | Admitting: Internal Medicine

## 2014-10-01 ENCOUNTER — Ambulatory Visit (INDEPENDENT_AMBULATORY_CARE_PROVIDER_SITE_OTHER): Payer: BLUE CROSS/BLUE SHIELD | Admitting: Internal Medicine

## 2014-10-01 VITALS — BP 136/88 | HR 70 | Temp 98.1°F | Ht 64.0 in | Wt 277.0 lb

## 2014-10-01 DIAGNOSIS — R7302 Impaired glucose tolerance (oral): Secondary | ICD-10-CM | POA: Diagnosis not present

## 2014-10-01 DIAGNOSIS — J309 Allergic rhinitis, unspecified: Secondary | ICD-10-CM

## 2014-10-01 DIAGNOSIS — I10 Essential (primary) hypertension: Secondary | ICD-10-CM | POA: Diagnosis not present

## 2014-10-01 MED ORDER — PREDNISONE 10 MG PO TABS: ORAL_TABLET | ORAL | Status: DC

## 2014-10-01 MED ORDER — METHYLPREDNISOLONE ACETATE 80 MG/ML IJ SUSP
80.0000 mg | Freq: Once | INTRAMUSCULAR | Status: AC
  Administered 2014-10-01: 80 mg via INTRAMUSCULAR

## 2014-10-01 NOTE — Progress Notes (Signed)
Subjective:    Patient ID: [name] [name], female    DOB: 07/04/1950, 64 y.o.   MRN: [id number]  HPI  Here to f/u with 3-4 days ongoing mild now mod to severe nasal allergy symptoms with clearish congestion, itch and sneezing and ST, without fever, pain, worsening cough, swelling or wheezing. Pt denies chest pain, increased sob or doe, wheezing, orthopnea, PND, increased LE swelling, palpitations, dizziness or syncope.  All despite current antihist and steroid nasal spray, though admits does not take daily.  Pt denies polydipsia, polyuria  Past Medical History  Diagnosis Date  . Chronic rhinitis   . Morbid obesity     target wt=179lb for BMI<30 Peak wt 282lb  . History of DVT (deep vein thrombosis)   . COPD (chronic obstructive pulmonary disease)     Wert. PFTs 07/25/06 FEV1 55% ratio 53, DLC0 51% HFA 50% 12/16/2009  . Depression   . Osteoporosis     last BMD 4/08 -2.4, intolerant of bisphosphonates  . Hypertension   . Atrial fibrillation and flutter   . Anxiety   . Glucose intolerance (impaired glucose tolerance)     on steroids  . VITAMIN D DEFICIENCY   . HYPERLIPIDEMIA   . PREMATURE VENTRICULAR CONTRACTIONS   . Rhabdomyolysis 07/29/2009  . FRACTURE, RIB, RIGHT 06/18/2009  . PULMONARY EMBOLISM, HX OF 06/19/2007  . Tremor   . Hx of cardiac catheterization     LHC (01/2001):  Normal Cors.  EF 65%.;  LexiScan Myoview (03/2013): No ischemia, EF 61%, normal  . Hx of echocardiogram     Echo (11/2011):  Mod LVH, EF 60-65%, no RWMA, MAC, mild LAE, PASP 34.  . Asthma   . CHF (congestive heart failure)    Past Surgical History  Procedure Laterality Date  . Abdominal hysterectomy    . Tonsillectomy    . Skin graft to middle r finger  1975  . S/p left arm fracture with fall off chair    . Colonoscopy  10/22/2003, ?2006    reports that she quit smoking about 18 years ago. Her smoking use included Cigarettes. She has a 35 pack-year smoking history. She has never used smokeless tobacco. She  reports that she does not drink alcohol or use illicit drugs. family history includes Asthma in her brother and son; Heart disease in her mother; Throat cancer in her brother. Allergies  Allergen Reactions  . Duloxetine Other (See Comments)    REACTION: rhabdomyolysis  . Penicillins Other (See Comments)    SYNCOPE  . Latex Rash   Current Outpatient Prescriptions on File Prior to Visit  Medication Sig Dispense Refill  . Aclidinium Bromide (TUDORZA PRESSAIR) 400 MCG/ACT AEPB Inhale 1 puff into the lungs 2 (two) times daily. 3 each 1  . atorvastatin (LIPITOR) 10 MG tablet Take 1 tablet (10 mg total) by mouth daily. 90 tablet 2  . Calcium Carbonate-Vitamin D 600-400 MG-UNIT per tablet Take 1 tablet by mouth 2 (two) times daily.      . cetirizine (ZYRTEC) 10 MG tablet Take 10 mg by mouth at bedtime.     . cholecalciferol (VITAMIN D) 1000 UNITS tablet Take 1,000 Units by mouth every morning.    Marland Kitchen Dextromethorphan-Guaifenesin (MUCINEX DM MAXIMUM STRENGTH) 60-1200 MG TB12 Take 1 tablet by mouth every 12 (twelve) hours as needed (cough/congestion).    Marland Kitchen diltiazem (CARTIA XT) 180 MG 24 hr capsule Take 1 capsule (180 mg total) by mouth daily. 90 capsule 0  . FLUoxetine (PROZAC) 40 MG  capsule TAKE 1 CAPSULE DAILY 90 capsule 1  . fluticasone (FLONASE) 50 MCG/ACT nasal spray Place 2 sprays into both nostrils daily. 16 g 5  . furosemide (LASIX) 80 MG tablet Take 80 mg by mouth 2 (two) times daily. May take 1 extra daily as needed w/ Klor Con for leg swelling    . furosemide (LASIX) 80 MG tablet TAKE 1 TABLET TWICE A DAY 180 tablet 3  . glucosamine-chondroitin 500-400 MG tablet Take 1 tablet by mouth every morning.     Marland Kitchen KLOR-CON M20 20 MEQ tablet Take 1 tablet (20 mEq total) by mouth 2 (two) times daily. 180 tablet 1  . levalbuterol (XOPENEX) 1.25 MG/3ML nebulizer solution Take 1.25 mg by nebulization every 4 (four) hours as needed for wheezing or shortness of breath.    . mometasone (NASONEX) 50 MCG/ACT  nasal spray Place 2 sprays into the nose 2 (two) times daily as needed (For nasal congestion.).     Marland Kitchen omeprazole (PRILOSEC) 20 MG capsule 2 tab by mouth per day (Patient taking differently: Take 40 mg by mouth daily. 2 tab by mouth per day) 180 capsule 3  . PROAIR HFA 108 (90 BASE) MCG/ACT inhaler INHALE 2 PUFFS EVERY FOUR HOURS AS NEEDED 3 each 0  . propafenone (RYTHMOL SR) 325 MG 12 hr capsule TAKE 1 CAPSULE TWICE A DAY 180 capsule 2  . sodium chloride (OCEAN) 0.65 % SOLN nasal spray Place 2 sprays into both nostrils 2 (two) times daily as needed for congestion.     . SYMBICORT 160-4.5 MCG/ACT inhaler USE 2 INHALATIONS TWICE A DAY 3 Inhaler 0  . warfarin (COUMADIN) 7.5 MG tablet TAKE AS DIRECTED BY COUMADIN CLINIC 120 tablet 1   No current facility-administered medications on file prior to visit.    Review of Systems  Constitutional: Negative for unusual diaphoresis or night sweats HENT: Negative for ringing in ear or discharge Eyes: Negative for double vision or worsening visual disturbance.  Respiratory: Negative for choking and stridor.   Gastrointestinal: Negative for vomiting or other signifcant bowel change Genitourinary: Negative for hematuria or change in urine volume.  Musculoskeletal: Negative for other MSK pain or swelling Skin: Negative for color change and worsening wound.  Neurological: Negative for tremors and numbness other than noted  Psychiatric/Behavioral: Negative for decreased concentration or agitation other than above       Objective:   Physical Exam BP 136/88 mmHg  Pulse 70  Temp(Src) 98.1 F (36.7 C) (Oral)  Ht 5\' 4"  (1.626 m)  Wt 277 lb (125.646 kg)  BMI 47.52 kg/m2  SpO2 96% VS noted,  nontoxic Constitutional: Pt appears in no significant distress HENT: Head: NCAT.  Right Ear: External ear normal.  Left Ear: External ear normal.  Eyes: . Pupils are equal, round, and reactive to light. Conjunctivae and EOM are normal Bilat tm's with mild erythema.   Max sinus areas non tender.  Pharynx with mild erythema, no exudate Neck: Normal range of motion. Neck supple.  Cardiovascular: Normal rate and regular rhythm.   Pulmonary/Chest: Effort normal and breath sounds decreased without rales or wheezing.  Neurological: Pt is alert. Not confused , motor grossly intact Skin: Skin is warm. No rash, no LE edema Psychiatric: Pt behavior is normal. No agitation.     Assessment & Plan:

## 2014-10-01 NOTE — Progress Notes (Signed)
Pre visit review using our clinic review tool, if applicable. No additional management support is needed unless otherwise documented below in the visit note. 

## 2014-10-01 NOTE — Patient Instructions (Signed)
You had the steroid shot today  Please take all new medication as prescribed - the prednisone  Please continue all other medications as before, and refills have been done if requested.  Please have the pharmacy call with any other refills you may need.  Please continue your efforts at being more active, low cholesterol diet, and weight control.  Please keep your appointments with your specialists as you may have planned   

## 2014-10-02 NOTE — Assessment & Plan Note (Signed)
Mild to mod seasonal flare, for depomedrol IM, predpac asd, re-start home meds,  to f/u any worsening symptoms or concerns

## 2014-10-02 NOTE — Assessment & Plan Note (Signed)
stable overall by history and exam, recent data reviewed with pt, and pt to continue medical treatment as before,  to f/u any worsening symptoms or concerns Lab Results  Component Value Date   HGBA1C 5.2 01/31/2014   Pt to f/u onset polys with steroid tx

## 2014-10-02 NOTE — Assessment & Plan Note (Signed)
stable overall by history and exam, recent data reviewed with pt, and pt to continue medical treatment as before,  to f/u any worsening symptoms or concerns BP Readings from Last 3 Encounters:  10/01/14 136/88  09/03/14 146/82  06/04/14 140/78

## 2014-10-07 ENCOUNTER — Telehealth: Payer: Self-pay | Admitting: Internal Medicine

## 2014-10-07 MED ORDER — BUDESONIDE-FORMOTEROL FUMARATE 160-4.5 MCG/ACT IN AERO: INHALATION_SPRAY | RESPIRATORY_TRACT | Status: DC

## 2014-10-07 NOTE — Telephone Encounter (Signed)
Patient needs refill sent to [facility].  Patient has not been seen in a year.  Sent RX to pharmacy. Patient schedule for OV 11/1 for yearly follow up. Patient notified. Nothing further needed.

## 2014-10-08 ENCOUNTER — Ambulatory Visit (INDEPENDENT_AMBULATORY_CARE_PROVIDER_SITE_OTHER): Payer: BLUE CROSS/BLUE SHIELD | Admitting: General Practice

## 2014-10-08 DIAGNOSIS — I4891 Unspecified atrial fibrillation: Secondary | ICD-10-CM | POA: Diagnosis not present

## 2014-10-08 DIAGNOSIS — Z8672 Personal history of thrombophlebitis: Secondary | ICD-10-CM | POA: Diagnosis not present

## 2014-10-08 DIAGNOSIS — Z86718 Personal history of other venous thrombosis and embolism: Secondary | ICD-10-CM

## 2014-10-08 DIAGNOSIS — I4892 Unspecified atrial flutter: Secondary | ICD-10-CM | POA: Diagnosis not present

## 2014-10-08 DIAGNOSIS — Z7901 Long term (current) use of anticoagulants: Secondary | ICD-10-CM

## 2014-10-08 DIAGNOSIS — Z5181 Encounter for therapeutic drug level monitoring: Secondary | ICD-10-CM

## 2014-10-08 LAB — POCT INR: INR: 2.3

## 2014-10-08 NOTE — Progress Notes (Signed)
I have reviewed and agree with the plan. 

## 2014-10-08 NOTE — Progress Notes (Signed)
Pre visit review using our clinic review tool, if applicable. No additional management support is needed unless otherwise documented below in the visit note. 

## 2014-10-09 ENCOUNTER — Other Ambulatory Visit: Payer: Self-pay | Admitting: *Deleted

## 2014-10-09 MED ORDER — DILTIAZEM HCL ER COATED BEADS 180 MG PO CP24: 180.0000 mg | ORAL_CAPSULE | Freq: Every day | ORAL | Status: DC

## 2014-10-30 ENCOUNTER — Other Ambulatory Visit: Payer: Self-pay | Admitting: Internal Medicine

## 2014-11-03 ENCOUNTER — Encounter: Payer: Self-pay | Admitting: Internal Medicine

## 2014-11-04 ENCOUNTER — Other Ambulatory Visit: Payer: Self-pay | Admitting: *Deleted

## 2014-11-04 ENCOUNTER — Telehealth: Payer: Self-pay | Admitting: *Deleted

## 2014-11-04 MED ORDER — FUROSEMIDE 80 MG PO TABS: 80.0000 mg | ORAL_TABLET | Freq: Two times a day (BID) | ORAL | Status: DC

## 2014-11-04 NOTE — Telephone Encounter (Signed)
klor Con is followed by PCP.Marland KitchenMarland Kitchen

## 2014-11-04 NOTE — Telephone Encounter (Signed)
Pt pharm is asking for medication, doxycycline & Fluoxetine, Klor Con & Lasix, all are followed by PCP. Sending this to your per standard work.

## 2014-11-04 NOTE — Telephone Encounter (Signed)
Lasix is followed by PCP.Marland KitchenMarland Kitchen

## 2014-11-04 NOTE — Telephone Encounter (Signed)
pts Diltiazem was ordered in Sep for a 3 month supply and she has a OV in OCT, can be filled then.Marland Kitchen

## 2014-11-04 NOTE — Telephone Encounter (Signed)
Hi Brandy,  Will you please see if refill dept can call pharmacy to let them know that looks like PCP is handling these meds. I will be out of the office 10/25. Thank you for your time and effort in this matter.  Thank you Arbie Cookey

## 2014-11-05 ENCOUNTER — Other Ambulatory Visit: Payer: Self-pay | Admitting: *Deleted

## 2014-11-05 NOTE — Telephone Encounter (Signed)
called and informed pt that her med requested from her phramacy needed to go to her PCP, faxed the pharmacy as well.Marland Kitchen

## 2014-11-07 NOTE — Progress Notes (Signed)
HPI: FU AFib maintaining NSR on Propafenone, prior pulmonary embolism, chronic coumadin, diastolic dysfunction, morbid obesity, COPD, LE edema. Cardiac cath in 2003 was normal. Echo in 11/13 demonstrated normal LVF. Nuclear study March 2015 showed an ejection fraction 61% and normal perfusion. Since she was last seen She notes increased dyspnea on exertion and occasional orthopnea. No PND or pedal edema. No exertional chest pain or syncope.  Current Outpatient Prescriptions  Medication Sig Dispense Refill  . Aclidinium Bromide (TUDORZA PRESSAIR) 400 MCG/ACT AEPB Inhale 1 puff into the lungs 2 (two) times daily. 3 each 1  . atorvastatin (LIPITOR) 10 MG tablet Take 1 tablet (10 mg total) by mouth daily. 90 tablet 2  . budesonide-formoterol (SYMBICORT) 160-4.5 MCG/ACT inhaler USE 2 INHALATIONS TWICE A DAY 3 Inhaler 0  . Calcium Carbonate-Vitamin D 600-400 MG-UNIT per tablet Take 1 tablet by mouth 2 (two) times daily.      . cetirizine (ZYRTEC) 10 MG tablet Take 10 mg by mouth at bedtime.     . cholecalciferol (VITAMIN D) 1000 UNITS tablet Take 1,000 Units by mouth every morning.    Marland Kitchen Dextromethorphan-Guaifenesin (MUCINEX DM MAXIMUM STRENGTH) 60-1200 MG TB12 Take 1 tablet by mouth every 12 (twelve) hours as needed (cough/congestion).    Marland Kitchen diltiazem (CARTIA XT) 180 MG 24 hr capsule Take 1 capsule (180 mg total) by mouth daily. 90 capsule 0  . FLUoxetine (PROZAC) 40 MG capsule TAKE 1 CAPSULE DAILY 90 capsule 1  . furosemide (LASIX) 80 MG tablet Take 1 and 1/2 tablets in the morning and 1 tablet in the afternoon 225 tablet 3  . glucosamine-chondroitin 500-400 MG tablet Take 1 tablet by mouth every morning.     Marland Kitchen HYDROcodone-acetaminophen (NORCO) 10-325 MG tablet Take 1 tablet by mouth every 6 (six) hours as needed.    Marland Kitchen HYDROMET 5-1.5 MG/5ML syrup Take 5 mLs by mouth daily as needed.  0  . KLOR-CON M20 20 MEQ tablet Take 1 tablet (20 mEq total) by mouth 2 (two) times daily. 180 tablet 1  .  levalbuterol (XOPENEX) 1.25 MG/3ML nebulizer solution Take 1.25 mg by nebulization every 4 (four) hours as needed for wheezing or shortness of breath.    . mometasone (NASONEX) 50 MCG/ACT nasal spray Place 2 sprays into the nose 2 (two) times daily as needed (For nasal congestion.).     Marland Kitchen omeprazole (PRILOSEC) 20 MG capsule TAKE 2 CAPSULES DAILY 180 capsule 2  . PROAIR HFA 108 (90 BASE) MCG/ACT inhaler INHALE 2 PUFFS EVERY FOUR HOURS AS NEEDED 3 each 0  . propafenone (RYTHMOL SR) 325 MG 12 hr capsule TAKE 1 CAPSULE TWICE A DAY 180 capsule 2  . sodium chloride (OCEAN) 0.65 % SOLN nasal spray Place 2 sprays into both nostrils 2 (two) times daily as needed for congestion.     Marland Kitchen warfarin (COUMADIN) 7.5 MG tablet TAKE AS DIRECTED BY COUMADIN CLINIC 120 tablet 1   No current facility-administered medications for this visit.     Past Medical History  Diagnosis Date  . Chronic rhinitis   . Morbid obesity ([place])     target wt=179lb for BMI<30 Peak wt 282lb  . History of DVT (deep vein thrombosis)   . COPD (chronic obstructive pulmonary disease) ([place])     [place]. PFTs 07/25/06 FEV1 55% ratio 53, DLC0 51% HFA 50% 12/16/2009  . Depression   . Osteoporosis     last BMD 4/08 -2.4, intolerant of bisphosphonates  . Hypertension   . Atrial  fibrillation and flutter ([place])   . Anxiety   . Glucose intolerance (impaired glucose tolerance)     on steroids  . VITAMIN D DEFICIENCY   . HYPERLIPIDEMIA   . PREMATURE VENTRICULAR CONTRACTIONS   . Rhabdomyolysis 07/29/2009  . FRACTURE, RIB, RIGHT 06/18/2009  . PULMONARY EMBOLISM, HX OF 06/19/2007  . Tremor   . Hx of cardiac catheterization     LHC (01/2001):  Normal Cors.  EF 65%.;  LexiScan Myoview (03/2013): No ischemia, EF 61%, normal  . Hx of echocardiogram     Echo (11/2011):  Mod LVH, EF 60-65%, no RWMA, MAC, mild LAE, PASP 34.  . Asthma   . CHF (congestive heart failure) [facility])     Past Surgical History  Procedure Laterality Date  . Abdominal hysterectomy      . Tonsillectomy    . Skin graft to middle r finger  1975  . S/p left arm fracture with fall off chair    . Colonoscopy  10/22/2003, ?2006    Social History   Social History  . Marital Status: Married    Spouse Name: N/A  . Number of Children: 3  . Years of Education: N/A   Occupational History  . 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 Smoking status: Former Smoker -- 1.00 packs/day for 35 years    Types: Cigarettes    Quit date: 01/12/1996  . Smokeless tobacco: Never Used  . Alcohol Use: No  . Drug Use: No  . Sexual Activity: Not on file   Other Topics Concern  . Not on file   Social History Narrative    ROS: no fevers or chills, productive cough, hemoptysis, dysphasia, odynophagia, melena, hematochezia, dysuria, hematuria, rash, seizure activity, PND, pedal edema, claudication. Remaining systems are negative.  Physical Exam: Well-developed morbidly obese in no acute distress.  Skin is warm and dry.  HEENT is normal.  Neck is supple.  Chest is clear to auscultation with normal expansion.  Cardiovascular exam is regular rate and rhythm.  Abdominal exam nontender or distended. No masses palpated. Extremities show no edema. neuro grossly intact  ECG Normal sinus rhythm at a rate of 90. Nonspecific ST changes.

## 2014-11-11 ENCOUNTER — Ambulatory Visit (INDEPENDENT_AMBULATORY_CARE_PROVIDER_SITE_OTHER): Payer: BLUE CROSS/BLUE SHIELD | Admitting: Cardiology

## 2014-11-11 ENCOUNTER — Telehealth: Payer: Self-pay | Admitting: Cardiology

## 2014-11-11 ENCOUNTER — Encounter: Payer: Self-pay | Admitting: Cardiology

## 2014-11-11 VITALS — BP 154/68 | HR 90 | Ht 64.0 in | Wt 273.7 lb

## 2014-11-11 DIAGNOSIS — R0602 Shortness of breath: Secondary | ICD-10-CM

## 2014-11-11 DIAGNOSIS — I10 Essential (primary) hypertension: Secondary | ICD-10-CM | POA: Diagnosis not present

## 2014-11-11 DIAGNOSIS — I4891 Unspecified atrial fibrillation: Secondary | ICD-10-CM

## 2014-11-11 DIAGNOSIS — I5033 Acute on chronic diastolic (congestive) heart failure: Secondary | ICD-10-CM

## 2014-11-11 MED ORDER — DILTIAZEM HCL ER COATED BEADS 240 MG PO CP24: 240.0000 mg | ORAL_CAPSULE | Freq: Every day | ORAL | Status: DC

## 2014-11-11 MED ORDER — FUROSEMIDE 80 MG PO TABS: ORAL_TABLET | ORAL | Status: DC

## 2014-11-11 NOTE — Assessment & Plan Note (Signed)
Discussed need for weight loss. 

## 2014-11-11 NOTE — Telephone Encounter (Signed)
Spoke with pt, questions regarding medications  And lab work answered.

## 2014-11-11 NOTE — Assessment & Plan Note (Signed)
Blood pressure elevated.Increase Cardizem to 240 mg daily and follow.

## 2014-11-11 NOTE — Assessment & Plan Note (Signed)
Continue statin. 

## 2014-11-11 NOTE — Patient Instructions (Addendum)
Medication Instructions:   INCREASE FUROSEMIDE TO 120 MG IN THE MORNING AND 80 MG IN THE AFTERNOON= 1 AND 1/2 TAB IN THE AM AND 1 TAB IN THE PM  INCREASE DILTIAZEM TO 240 MG ONCE DAILY  Labwork:  Your physician recommends that you return for lab work in: [place]:  Your physician recommends that you schedule a follow-up appointment in: [place]  If you need a refill on your cardiac medications before your next appointment, please call your pharmacy.

## 2014-11-11 NOTE — Assessment & Plan Note (Signed)
Patient remains in sinus rhythm. Continue propafenone, Cardizem and Coumadin.

## 2014-11-11 NOTE — Assessment & Plan Note (Signed)
Patient states that she feels volume overloaded. She is having increased dyspnea but this could be multifactorial including obesity hypoventilation syndrome, sleep apnea, COPD in addition to CHF. She does not appear to be markedly volume overloaded on examination. I will increase Lasix to 120 in the morning and 80 in the evening. In one week check potassium, renal function and BNP. We discussed low-sodium diet and fluid restriction. I asked her to weigh herself daily and she states she has been told this before.

## 2014-11-11 NOTE — Telephone Encounter (Signed)
Pt was instructed to call back to the office to let the doctor know that she did receive her Furosemide in the mail . She also says that she had one more question to ask Hilda Blades ,if she could call back.   Thanks

## 2014-11-12 ENCOUNTER — Other Ambulatory Visit: Payer: Self-pay | Admitting: Cardiology

## 2014-11-12 ENCOUNTER — Ambulatory Visit: Payer: Medicare Other | Admitting: Internal Medicine

## 2014-11-12 MED ORDER — DILTIAZEM HCL ER COATED BEADS 240 MG PO CP24: 240.0000 mg | ORAL_CAPSULE | Freq: Every day | ORAL | Status: DC

## 2014-11-12 NOTE — Telephone Encounter (Signed)
Called pt to inform her that a 15 day supply of the Diltiazem 240 mg was sent to [place], until her mail order arrives. I informed her that if she has any other problems, questions or concerns to call the office. Pt verbalized understanding.

## 2014-11-19 ENCOUNTER — Ambulatory Visit (INDEPENDENT_AMBULATORY_CARE_PROVIDER_SITE_OTHER): Payer: BLUE CROSS/BLUE SHIELD | Admitting: General Practice

## 2014-11-19 DIAGNOSIS — I4892 Unspecified atrial flutter: Secondary | ICD-10-CM

## 2014-11-19 DIAGNOSIS — Z86718 Personal history of other venous thrombosis and embolism: Secondary | ICD-10-CM | POA: Diagnosis not present

## 2014-11-19 DIAGNOSIS — Z8672 Personal history of thrombophlebitis: Secondary | ICD-10-CM

## 2014-11-19 DIAGNOSIS — I4891 Unspecified atrial fibrillation: Secondary | ICD-10-CM | POA: Diagnosis not present

## 2014-11-19 DIAGNOSIS — Z5181 Encounter for therapeutic drug level monitoring: Secondary | ICD-10-CM

## 2014-11-19 DIAGNOSIS — Z7901 Long term (current) use of anticoagulants: Secondary | ICD-10-CM

## 2014-11-19 LAB — CBC
HCT: 34.3 % — ABNORMAL LOW (ref 36.0–46.0)
Hemoglobin: 11.6 g/dL — ABNORMAL LOW (ref 12.0–15.0)
MCH: 28.1 pg (ref 26.0–34.0)
MCHC: 33.8 g/dL (ref 30.0–36.0)
MCV: 83.1 fL (ref 78.0–100.0)
MPV: 10.2 fL (ref 8.6–12.4)
Platelets: 230 10*3/uL (ref 150–400)
RBC: 4.13 MIL/uL (ref 3.87–5.11)
RDW: 17.5 % — ABNORMAL HIGH (ref 11.5–15.5)
WBC: 6.5 10*3/uL (ref 4.0–10.5)

## 2014-11-19 LAB — POCT INR: INR: 3.9

## 2014-11-19 NOTE — Progress Notes (Signed)
Pre visit review using our clinic review tool, if applicable. No additional management support is needed unless otherwise documented below in the visit note. 

## 2014-11-19 NOTE — Progress Notes (Signed)
I have reviewed and agree with the plan. 

## 2014-11-20 LAB — BASIC METABOLIC PANEL
BUN: 10 mg/dL (ref 7–25)
CO2: 34 mmol/L — ABNORMAL HIGH (ref 20–31)
Calcium: 8.4 mg/dL — ABNORMAL LOW (ref 8.6–10.4)
Chloride: 98 mmol/L (ref 98–110)
Creat: 0.74 mg/dL (ref 0.50–0.99)
Glucose, Bld: 140 mg/dL — ABNORMAL HIGH (ref 65–99)
Potassium: 3.6 mmol/L (ref 3.5–5.3)
Sodium: 141 mmol/L (ref 135–146)

## 2014-11-20 LAB — BRAIN NATRIURETIC PEPTIDE: Brain Natriuretic Peptide: 108.1 pg/mL — ABNORMAL HIGH (ref 0.0–100.0)

## 2014-11-26 ENCOUNTER — Ambulatory Visit (INDEPENDENT_AMBULATORY_CARE_PROVIDER_SITE_OTHER)
Admission: RE | Admit: 2014-11-26 | Discharge: 2014-11-26 | Disposition: A | Discharge status: Home / Self Care | Payer: BLUE CROSS/BLUE SHIELD | Source: Ambulatory Visit | Attending: Internal Medicine | Admitting: Internal Medicine

## 2014-11-26 ENCOUNTER — Encounter: Payer: Self-pay | Admitting: Internal Medicine

## 2014-11-26 ENCOUNTER — Ambulatory Visit (INDEPENDENT_AMBULATORY_CARE_PROVIDER_SITE_OTHER): Payer: BLUE CROSS/BLUE SHIELD | Admitting: Internal Medicine

## 2014-11-26 VITALS — BP 118/72 | HR 60 | Ht 64.0 in | Wt 259.0 lb

## 2014-11-26 DIAGNOSIS — J432 Centrilobular emphysema: Secondary | ICD-10-CM | POA: Diagnosis not present

## 2014-11-26 DIAGNOSIS — J9612 Chronic respiratory failure with hypercapnia: Secondary | ICD-10-CM | POA: Diagnosis not present

## 2014-11-26 DIAGNOSIS — J9611 Chronic respiratory failure with hypoxia: Secondary | ICD-10-CM | POA: Diagnosis not present

## 2014-11-26 MED ORDER — BUDESONIDE-FORMOTEROL FUMARATE 160-4.5 MCG/ACT IN AERO: INHALATION_SPRAY | RESPIRATORY_TRACT | Status: DC

## 2014-11-26 NOTE — Progress Notes (Signed)
Subjective:    Patient ID: [name] [name], female    DOB: 08/05/1950.   MRN: [id number]    Brief patient profile:  31 yowf last smoked 2003 with COPD with minimum asthmatic component with an FEV1 of 55%, a ratio of 53%, and a diffusing capacity 51% documented 07/25/06. On 02 24 hours per day at 2lpm  06/2007 weighed 271 and it was felt she was as debilitated as much by obesity as by airflow obstruction.  baseline = when well = walk to mother in [place] but stops at least once because it's uphill and frequently requiring rescue rx.    History of Present Illness  March 19, 2010-- Post hospital visit. Admitted 2/28-03/16/10( dc wt at 269 which was about 10 lbs down) for Acute on chronic resp failure secondary to decompensated diastolic heart failure . She was tx with aggressive diuresis. Conintued on coumadin. INR yesterday was 2.4. 2 D echo showed EF [id number] w/ gr 2 diastolic dysfunction . VQ scan with low probability for PE. Since discharge some better but still real weak. Cough and congestion are some better. Edema worse this am, took with lasix that helped. rec no change in rx    09/11/2013 f/u ov/Jamail Cullers re: aecopd Chief Complaint  Patient presents with  . Follow-up    fever last night sinus infection coughing yellowish throwing up  has med calendar but not using action plan re max dose of symbicort/ afrin/tramadol in event of a flare. Prior to last several days had been doing well on just symbicort 160 1 bid. Has not used neb yet as back up and usually at baseline not needing saba  rec Avelox 400 mg daily x 5 days and let the coumadin clinic know you're taking it  Prednisone 10 mg take  4 each am x 2 days,   2 each am x 2 days,  1 each am x 2 days and stop     11/26/2014  f/u ov/Brandan Glauber re: copd/ severe obesity /uacs  - has med calendar, not really using the action plans at the bottom Chief Complaint  Patient presents with  . Follow-up    Pt c/o increased cough for the past few months non  prod.  She states "feels like I am downding in fluid".     On 2-2.5  24/7 able to do [employer] with 02 in a basket  sleep ok/ sensation of pnds day > noct    No obvious day to day or daytime variabilty or assoc chronic cough or cp or chest tightness, subjective wheeze overt sinus or hb symptoms. No unusual exp hx or h/o childhood pna/ asthma or knowledge of premature birth.  Sleeping ok without nocturnal  or early am exacerbation  of respiratory  c/o's or need for noct saba. Also denies any obvious fluctuation of symptoms with weather or environmental changes or other aggravating or alleviating factors except as outlined above   Current Medications, Allergies, Complete Past Medical History, Past Surgical History, Family History, and Social History were reviewed in [employer] record.  ROS  The following are not active complaints unless bolded sore throat, dysphagia, dental problems, itching, sneezing,  nasal congestion or excess/ purulent secretions, ear ache,   fever, chills, sweats, unintended wt loss, pleuritic or exertional cp, hemoptysis,  orthopnea pnd or leg swelling, presyncope, palpitations, heartburn, abdominal pain, anorexia, nausea, vomiting, diarrhea  or change in bowel or urinary habits, change in stools or urine, dysuria,hematuria,  rash, arthralgias, visual  complaints, headache, numbness weakness or ataxia or problems with walking or coordination,  change in mood/affect or memory.           Past Medical History:  CHRONIC RHINITIS (ICD-472.0)  EDEMA (ICD-782.3)  ACUTE AND CHRONIC RESPIRATORY FAILURE (ICD-518.84)  MORBID OBESITY (ICD-278.01)  - Target wt = 179 for BMI < 30 peak wt 282  - Referred back to nutrition December 18, 2007  DEEP VENOUS THROMBOPHLEBITIS, HX OF (ICD-V12.52)  COPD (ICD-496)..........................................................Marland KitchenWert  - PFTs 07/25/06 FEV1 55% ratio 53, DLC0 51%  - HFA 50% December 16, 2009 > 75% March 27, 2010   DEPRESSION (ICD-311)  OSTEOPOROSIS (ICD-733.00) last BMD 4/08 -2.4, intolerant of bisphosphonates  HYPERTENSION (ICD-401.9)  Atrial fibrillation - paroxysmal  Anticoagulation therapy  Pulmonary embolism, hx of  neg stress myoview 10/07  Anxiety  glucose intolerance - on steroids  Hyperlipidemia  Complex medical regimen  - Med calendar done 05/01/2008, 08/26/2010 , 01/17/12 , 06/26/2013          Objective:   Physical Exam  GEN: A/Ox3; pleasant , NAD, obese wf nad  Wt  274 09/01/10 >   09/29/2010  275 > 01/01/2011  280 > 04/29/2011  279 > 285 11/17/2011  > 288 02/18/2012 > 281 04/19/2012 > 03/13/2013  282 >  09/11/2013  282 >  11/26/2014  259   HEENT:  [place]/AT,  EACs-clear, TMs-wnl, NOSE-clear drainage, THROAT-clear, no lesions, no postnasal drip or exudate noted.   NECK:  Supple w/ fair ROM; no JVD; normal carotid impulses w/o bruits; no thyromegaly or nodules palpated; no lymphadenopathy.  RESP  Diminshed BS in bases  w/o, wheezes/ rales/ or rhonchi.no accessory muscle use, no dullness to percussion  CARD:  RRR, no m/r/g   - bilateral lower ext trace pitting   edema, pulses intact, no cyanosis or clubbing.  GI:   Soft & nt; nml bowel sounds; no organomegaly or masses detected.  Musco: Warm bil, no deformities or joint swelling noted.   Neuro: alert, no focal deficits noted, very min bilat resting tremor    Skin: Warm, no lesions or rashes    CXR PA and Lateral:   11/26/2014 :    I personally reviewed images and agree with radiology impression as follows:    1. Mild enlargement of the cardiopericardial silhouette, with suspected pulmonary venous hypertension but no overt edema. 2. Scarring or subsegmental atelectasis at the left lung base.        Assessment & Plan:

## 2014-11-26 NOTE — Patient Instructions (Addendum)
For drainage / throat tickle try take CHLORPHENIRAMINE  4 mg - take one every 4 hours as needed - available over the counter- may cause drowsiness so start with just a bedtime dose or two and see how you tolerate it before trying in daytime   Please remember to go to the  x-ray department downstairs for your tests - we will call you with the results when they are available.   See calendar for specific medication instructions and bring it back for each and every office visit for every healthcare provider you see.  Without it,  you may not receive the best quality medical care that we feel you deserve.  You will note that the calendar groups together  your maintenance  medications that are timed at particular times of the day.  Think of this as your checklist for what your doctor has instructed you to do until your next evaluation to see what benefit  there is  to staying on a consistent group of medications intended to keep you well.  The other group at the bottom is entirely up to you to use as you see fit  for specific symptoms that may arise between visits that require you to treat them on an as needed basis.  Think of this as your action plan or "what if" list.   Separating the top medications from the bottom group is fundamental to providing you adequate care going forward.    Return yearly for f/u but call sooner if you use the action plan at the bottom of your med calendar and it doesn't address the problem adequately

## 2014-11-27 NOTE — Progress Notes (Signed)
Quick Note:  LMTCB ______ 

## 2014-11-28 NOTE — Progress Notes (Signed)
Quick Note:  atc pt x2, line rang busy  wcb ______

## 2014-11-29 ENCOUNTER — Telehealth: Payer: Self-pay | Admitting: Internal Medicine

## 2014-11-29 NOTE — Telephone Encounter (Signed)
Pt has already spoken with nurse regarding cxr results. Message she received is an old message. Nothing further needed.

## 2014-12-01 ENCOUNTER — Encounter: Payer: Self-pay | Admitting: Internal Medicine

## 2014-12-01 NOTE — Assessment & Plan Note (Signed)
Body mass index is 44.44 kg/(m^2).  Lab Results  Component Value Date   TSH 4.64* 01/31/2014     Contributing to gerd tendency/ doe/reviewed the need and the process to achieve and maintain neg calorie balance > defer f/u primary care including intermittently monitoring thyroid status

## 2014-12-01 NOTE — Assessment & Plan Note (Signed)
-   PFTs 07/25/06 FEV1 55% ratio 53, DLC0 51%  - PFT's 04/19/2012 FEV1 50% (ratio 51) with 19% improvement p B2, dlco 42 > 99% corrected  - med calendar 01/17/2012  -Tudorza trial  01/17/12  - HFA 75% 03/13/13  Adequate control on present rx, reviewed > no change in rx needed  = tudorza and symbicort   Only symptom is pnds/cough not related to copd but should be able to eliminate with 1st gen h1  I had an extended discussion with the patient reviewing all relevant studies completed to date and  lasting 15 to 20 minutes of a 25 minute visit    Each maintenance medication was reviewed in detail including most importantly the difference between maintenance and prns and under what circumstances the prns are to be triggered using an action plan format that is not reflected in the computer generated alphabetically organized AVS but trather by a customized med calendar that reflects the AVS meds with confirmed 100% correlation.   Please see instructions for details which were reviewed in writing and the patient given a copy highlighting the part that I personally wrote and discussed at today's ov.

## 2014-12-01 NOTE — Assessment & Plan Note (Signed)
-   09/29/10  Walked 3lpm  2 laps @ 185 ft each stopped due to  Sob with sats 90%     - HC03 11/08/11 = 34     - 11-17-11--o2 sat on ra at rest 83%ra     - 02/18/2012  Sat 88% RA across the room > Walked 3lpm x 3 laps @ 185 ft each stopped due to  End of study no desat     - 04/19/2012  Walked 3lpm x 3 laps @ 185 ft each stopped due to  End of study, sats dropped to 88 at very end  - HC03  34   11/19/14      Well compensated on 2.5 lpm > no change rx

## 2014-12-03 ENCOUNTER — Telehealth: Payer: Self-pay | Admitting: Cardiology

## 2014-12-03 NOTE — Telephone Encounter (Signed)
Pt is having problems.

## 2014-12-03 NOTE — Telephone Encounter (Signed)
Spoke with pt, she had diarrhea all night and some this am. She is very nauseated and not drinking or eating anything today. She feels she has a stomach bug. She has been tracking her bp for [place] and it has been fine but her heart rate today is 145. She took her diltiazem and lasix this morning. She feels fine other than than the nausea, the diarrhea has calmed down a little. She can not tell if she is in atrial fib or not. Explained she may be dehydrated due to the diarrhea and lasix. She will try to drink a little more fluid. She wants to know if she should take more diltiazem Will forward for dr Stanford Breed review

## 2014-12-03 NOTE — Telephone Encounter (Signed)
Bring to office in AM and check ECG Kirk [name] [name]

## 2014-12-03 NOTE — Telephone Encounter (Signed)
Spoke with pt, Aware of dr Jacalyn Lefevre recommendations.  Pt will come to the office tomorrow for EKG. If she feels worse or something changes she will call or go to the ER.

## 2014-12-04 ENCOUNTER — Ambulatory Visit (INDEPENDENT_AMBULATORY_CARE_PROVIDER_SITE_OTHER): Payer: BLUE CROSS/BLUE SHIELD | Admitting: Cardiology

## 2014-12-04 ENCOUNTER — Other Ambulatory Visit: Payer: Self-pay | Admitting: Cardiology

## 2014-12-04 ENCOUNTER — Encounter: Payer: Self-pay | Admitting: Cardiology

## 2014-12-04 ENCOUNTER — Encounter: Payer: Self-pay | Admitting: *Deleted

## 2014-12-04 VITALS — BP 128/70 | HR 143 | Ht 64.0 in | Wt 253.0 lb

## 2014-12-04 DIAGNOSIS — Z7901 Long term (current) use of anticoagulants: Secondary | ICD-10-CM

## 2014-12-04 DIAGNOSIS — I4891 Unspecified atrial fibrillation: Secondary | ICD-10-CM

## 2014-12-04 DIAGNOSIS — Z86718 Personal history of other venous thrombosis and embolism: Secondary | ICD-10-CM

## 2014-12-04 DIAGNOSIS — I483 Typical atrial flutter: Secondary | ICD-10-CM | POA: Diagnosis not present

## 2014-12-04 DIAGNOSIS — I5032 Chronic diastolic (congestive) heart failure: Secondary | ICD-10-CM

## 2014-12-04 DIAGNOSIS — J432 Centrilobular emphysema: Secondary | ICD-10-CM | POA: Diagnosis not present

## 2014-12-04 LAB — CBC
HCT: 37.3 % (ref 36.0–46.0)
Hemoglobin: 13 g/dL (ref 12.0–15.0)
MCH: 28.8 pg (ref 26.0–34.0)
MCHC: 34.9 g/dL (ref 30.0–36.0)
MCV: 82.7 fL (ref 78.0–100.0)
MPV: 11 fL (ref 8.6–12.4)
Platelets: 325 10*3/uL (ref 150–400)
RBC: 4.51 MIL/uL (ref 3.87–5.11)
RDW: 17.3 % — ABNORMAL HIGH (ref 11.5–15.5)
WBC: 12.2 10*3/uL — ABNORMAL HIGH (ref 4.0–10.5)

## 2014-12-04 NOTE — Progress Notes (Signed)
Cardiology Office Note   Date:  [id number]   ID:  [name] [name], DOB [id number], MRN [id number]  PCP:  Cathlean Cower, MD  Cardiologist:  Kirk Ruths MD  No chief complaint on file.     History of Present Illness: [name] [name] is a 64 y.o. female who is seen as a work in today for atrial flutter. She is a patient of Dr. Stanford Breed. She has a history of atrial fibrillation and flutter. She is s/p flutter ablation in 2010 by Dr. Lovena Le. She had recurrent Afib in 2012 and was placed on Rhythmol. She reports no recurrence since that time. She is on chronic Coumadin with therapeutic INRs. This past Saturday she felt that her pulse was faster. Home readings in 90-105 range. No dizziness. SOB is chronic and unchanged. Yesterday she had a gastroenteritis with diarrhea for over 24 hours. Noted HR staying high all day and came into office. Ecg showed atrial flutter with rate 143. No chest pain. She also has a history of PE, Diastolic dysfunction, morbid obesity, COPD, and chronic edema.     Past Medical History  Diagnosis Date  . Chronic rhinitis   . Morbid obesity ([place])     target wt=179lb for BMI<30 Peak wt 282lb  . History of DVT (deep vein thrombosis)   . COPD (chronic obstructive pulmonary disease) ([place])     [place]. PFTs 07/25/06 FEV1 55% ratio 53, DLC0 51% HFA 50% 12/16/2009  . Depression   . Osteoporosis     last BMD 4/08 -2.4, intolerant of bisphosphonates  . Hypertension   . Atrial fibrillation and flutter ([place])   . Anxiety   . Glucose intolerance (impaired glucose tolerance)     on steroids  . VITAMIN D DEFICIENCY   . HYPERLIPIDEMIA   . PREMATURE VENTRICULAR CONTRACTIONS   . Rhabdomyolysis 07/29/2009  . FRACTURE, RIB, RIGHT 06/18/2009  . PULMONARY EMBOLISM, HX OF 06/19/2007  . Tremor   . Hx of cardiac catheterization     LHC (01/2001):  Normal Cors.  EF 65%.;  LexiScan Myoview (03/2013): No ischemia, EF 61%, normal  . Hx of echocardiogram     Echo (11/2011):  Mod LVH, EF  60-65%, no RWMA, MAC, mild LAE, PASP 34.  . Asthma   . CHF (congestive heart failure) [facility])     Past Surgical History  Procedure Laterality Date  . Abdominal hysterectomy    . Tonsillectomy    . Skin graft to middle r finger  1975  . S/p left arm fracture with fall off chair    . Colonoscopy  10/22/2003, ?2006     Current Outpatient Prescriptions  Medication Sig Dispense Refill  . Aclidinium Bromide (TUDORZA PRESSAIR) 400 MCG/ACT AEPB Inhale 1 puff into the lungs 2 (two) times daily. 3 each 1  . atorvastatin (LIPITOR) 10 MG tablet Take 1 tablet (10 mg total) by mouth daily. 90 tablet 2  . budesonide-formoterol (SYMBICORT) 160-4.5 MCG/ACT inhaler USE 2 INHALATIONS TWICE A DAY (Patient taking differently: Inhale 1 puff into the lungs 2 (two) times daily. USE 2 INHALATIONS TWICE A DAY) 3 Inhaler 3  . Calcium Carbonate-Vitamin D 600-400 MG-UNIT per tablet Take 1 tablet by mouth 2 (two) times daily.      . cetirizine (ZYRTEC) 10 MG tablet Take 10 mg by mouth at bedtime.     . cholecalciferol (VITAMIN D) 1000 UNITS tablet Take 1,000 Units by mouth every morning.    Marland Kitchen Dextromethorphan-Guaifenesin (MUCINEX DM MAXIMUM STRENGTH) 60-1200 MG TB12  Take 1 tablet by mouth every 12 (twelve) hours as needed (cough/congestion).    Marland Kitchen diltiazem (CARDIZEM CD) 240 MG 24 hr capsule Take 1 capsule (240 mg total) by mouth daily. 15 capsule 0  . FLUoxetine (PROZAC) 40 MG capsule TAKE 1 CAPSULE DAILY 90 capsule 1  . furosemide (LASIX) 80 MG tablet Take 1 and 1/2 tablets in the morning and 1 tablet in the afternoon 225 tablet 3  . glucosamine-chondroitin 500-400 MG tablet Take 1 tablet by mouth every morning.     Marland Kitchen HYDROcodone-acetaminophen (NORCO) 10-325 MG tablet Take 1 tablet by mouth every 6 (six) hours as needed.    Marland Kitchen HYDROMET 5-1.5 MG/5ML syrup Take 5 mLs by mouth daily as needed.  0  . KLOR-CON M20 20 MEQ tablet Take 1 tablet (20 mEq total) by mouth 2 (two) times daily. 180 tablet 1  . levalbuterol  (XOPENEX) 1.25 MG/3ML nebulizer solution Take 1.25 mg by nebulization every 4 (four) hours as needed for wheezing or shortness of breath.    . mometasone (NASONEX) 50 MCG/ACT nasal spray Place 2 sprays into the nose 2 (two) times daily as needed (For nasal congestion.).     Marland Kitchen omeprazole (PRILOSEC) 20 MG capsule TAKE 2 CAPSULES DAILY 180 capsule 2  . PROAIR HFA 108 (90 BASE) MCG/ACT inhaler INHALE 2 PUFFS EVERY FOUR HOURS AS NEEDED 3 each 0  . propafenone (RYTHMOL SR) 325 MG 12 hr capsule TAKE 1 CAPSULE TWICE A DAY 180 capsule 2  . sodium chloride (OCEAN) 0.65 % SOLN nasal spray Place 2 sprays into both nostrils 2 (two) times daily as needed for congestion.     Marland Kitchen warfarin (COUMADIN) 7.5 MG tablet TAKE AS DIRECTED BY COUMADIN CLINIC 120 tablet 1   No current facility-administered medications for this visit.    Allergies:   Duloxetine; Penicillins; and Latex    Social History:  The patient  reports that she quit smoking about 18 years ago. Her smoking use included Cigarettes. She has a 35 pack-year smoking history. She has never used smokeless tobacco. She reports that she does not drink alcohol or use illicit drugs.   Family History:  The patient's family history includes Asthma in her brother and son; Heart disease in her mother; Throat cancer in her brother.    ROS:  Please see the history of present illness.   Otherwise, review of systems are positive for none.   All other systems are reviewed and negative.    PHYSICAL EXAM: VS:  BP 128/70 mmHg  Pulse 143  Ht 5\' 4"  (1.626 m)  Wt 114.76 kg (253 lb)  BMI 43.41 kg/m2 , BMI Body mass index is 43.41 kg/(m^2). GEN: Well nourished, well developed, in no acute distress HEENT: normal Neck: no JVD, carotid bruits, or masses Cardiac: RRR; no murmurs, rubs, or gallops,no edema  Respiratory:  clear to auscultation bilaterally, normal work of breathing GI: soft, nontender, nondistended, + BS MS: no deformity or atrophy Skin: warm and dry, no  rash Neuro:  Strength and sensation are intact Psych: euthymic mood, full affect   EKG:  EKG is ordered today. The ekg ordered today demonstrates atrial flutter with rate 143. Nonspecific TWA. I have personally reviewed and interpreted this study.    Recent Labs: 01/31/2014: TSH 4.64* 04/08/2014: ALT 15 11/19/2014: BUN 10; Creat 0.74; Hemoglobin 11.6*; Platelets 230; Potassium 3.6; Sodium 141    Lipid Panel    Component Value Date/Time   CHOL 140 01/31/2014 1532   TRIG 146.0 01/31/2014  1532   HDL 37.80* 01/31/2014 1532   CHOLHDL 4 01/31/2014 1532   VLDL 29.2 01/31/2014 1532   LDLCALC 73 01/31/2014 1532   LDLDIRECT 140.2 04/10/2010 0836      Wt Readings from Last 3 Encounters:  12/04/14 114.76 kg (253 lb)  11/26/14 117.482 kg (259 lb)  11/11/14 124.15 kg (273 lb 11.2 oz)      Other studies Reviewed: Additional studies/ records that were reviewed today include: Prior INRs have all been therapeutic.  Review of the above records demonstrates: N/A  ASSESSMENT AND PLAN:  1.  Atrial flutter with RVR. Exacerbated by gastroenteritis. This also resulted in reduced absorption of her meds. She has done very well on her current regimen for several years. She is minimally symptomatic. Diarrhea is now improved. Recommend resuming same meds. Will arrange for outpatient DCCV. Due to the holiday Monday is the earliest time we can schedule. Will update labs. If patient's symptoms worsen she is instructed to go to the ED.  2. Morbid obesity.  3. COPD on home oxygen.   4. History of pulmonary embolus.  5. Chronic anticoagulation.    Current medicines are reviewed at length with the patient today.  The patient does not have concerns regarding medicines.  The following changes have been made:  As noted   Labs/ tests ordered today include:  Orders Placed This Encounter  Procedures  . CBC  . Basic Metabolic Panel (BMET)  . INR/PT  . EKG 12-Lead     Disposition:   FU with Dr.  Stanford Breed post DCCV.  Signed, Ndia Sampath Martinique, MD  12/04/2014 1:18 PM    [facility] [address], [place], [place], [address] Phone [phone], Fax [phone]

## 2014-12-04 NOTE — Patient Instructions (Addendum)
Medication Instructions:   NO CHANGE  Labwork:  Your physician recommends that you HAVE LAB WORK TODAY  Testing/Procedures:  Your physician has recommended that you have a Cardioversion (DCCV). Electrical Cardioversion uses a jolt of electricity to your heart either through paddles or wired patches attached to your chest. This is a controlled, usually prescheduled, procedure. Defibrillation is done under light anesthesia in the hospital, and you usually go home the day of the procedure. This is done to get your heart back into a normal rhythm. You are not awake for the procedure. Please see the instruction sheet given to you today.    Follow-Up:  Your physician recommends that you schedule a follow-up appointment in: [address] [place] EXT[facility]ER  If you need a refill on your cardiac medications before your next appointment, please call your pharmacy.

## 2014-12-05 LAB — BASIC METABOLIC PANEL
BUN: 12 mg/dL (ref 7–25)
CO2: 29 mmol/L (ref 20–31)
Calcium: 9.1 mg/dL (ref 8.6–10.4)
Chloride: 100 mmol/L (ref 98–110)
Creat: 0.83 mg/dL (ref 0.50–0.99)
Glucose, Bld: 125 mg/dL — ABNORMAL HIGH (ref 65–99)
Potassium: 4.1 mmol/L (ref 3.5–5.3)
Sodium: 143 mmol/L (ref 135–146)

## 2014-12-05 LAB — PROTIME-INR
INR: 1.89 — ABNORMAL HIGH (ref <1.50)
Prothrombin Time: 22 seconds — ABNORMAL HIGH (ref 11.6–15.2)

## 2014-12-09 ENCOUNTER — Ambulatory Visit (HOSPITAL_BASED_OUTPATIENT_CLINIC_OR_DEPARTMENT_OTHER)
Admission: RE | Admit: 2014-12-09 | Payer: BLUE CROSS/BLUE SHIELD | Source: Ambulatory Visit | Admitting: Internal Medicine

## 2014-12-09 DIAGNOSIS — I483 Typical atrial flutter: Secondary | ICD-10-CM

## 2014-12-09 SURGERY — CARDIOVERSION
Anesthesia: Monitor Anesthesia Care

## 2014-12-09 NOTE — Progress Notes (Signed)
Patient seen last week for recurrent atrial flutter by Dr. Martinique in the office (patient of Dr. Stanford Breed) - she had gastroenteritis and diarrhea, presumably also malabsorption of medications. Scheduled for cardioversion today, however, review of labs indicate that INR is subtherapeutic. There is also a mild leukocytosis. I discussed with Dr. Stanford Breed and we will cancel the cardioversion today and try to reschedule in 4-6 weeks after re-establishing a therapeutic INR. From Dr. Doug Sou notes, she was not reportedly very symptomatic and HR was <100.  Pixie Casino, MD, [facility] Attending Cardiologist [place]

## 2014-12-10 ENCOUNTER — Ambulatory Visit (INDEPENDENT_AMBULATORY_CARE_PROVIDER_SITE_OTHER): Payer: BLUE CROSS/BLUE SHIELD | Admitting: Cardiology

## 2014-12-10 ENCOUNTER — Encounter: Payer: Self-pay | Admitting: Cardiology

## 2014-12-10 ENCOUNTER — Other Ambulatory Visit: Payer: Self-pay | Admitting: Cardiology

## 2014-12-10 ENCOUNTER — Ambulatory Visit: Payer: BLUE CROSS/BLUE SHIELD

## 2014-12-10 ENCOUNTER — Encounter: Payer: Self-pay | Admitting: *Deleted

## 2014-12-10 VITALS — BP 136/90 | HR 136 | Ht 64.0 in | Wt 274.4 lb

## 2014-12-10 DIAGNOSIS — I10 Essential (primary) hypertension: Secondary | ICD-10-CM | POA: Diagnosis not present

## 2014-12-10 DIAGNOSIS — I483 Typical atrial flutter: Secondary | ICD-10-CM | POA: Diagnosis not present

## 2014-12-10 DIAGNOSIS — I5032 Chronic diastolic (congestive) heart failure: Secondary | ICD-10-CM

## 2014-12-10 DIAGNOSIS — I4892 Unspecified atrial flutter: Secondary | ICD-10-CM

## 2014-12-10 DIAGNOSIS — I4891 Unspecified atrial fibrillation: Secondary | ICD-10-CM | POA: Diagnosis not present

## 2014-12-10 NOTE — Assessment & Plan Note (Signed)
Continue present dose of lasix.

## 2014-12-10 NOTE — Assessment & Plan Note (Addendum)
Patient has developed atrial flutter. Her rate is elevated and she is symptomatic. Continue cardizem and coumadin; continue propafenone. DCCV was canceled yesterday as INR subtherapeutic; Checked in office today and now 2.9. Proceed with TEE guided cardioversion in AM as she is extremely symptomatic.

## 2014-12-10 NOTE — Assessment & Plan Note (Signed)
Continue present meds

## 2014-12-10 NOTE — Assessment & Plan Note (Signed)
Continue statin. 

## 2014-12-10 NOTE — Patient Instructions (Signed)
Your physician has requested that you have a TEE/Cardioversion. During a TEE, sound waves are used to create images of your heart. It provides your doctor with information about the size and shape of your heart and how well your heart's chambers and valves are working. In this test, a transducer is attached to the end of a flexible tube that is guided down you throat and into your esophagus (the tube leading from your mouth to your stomach) to get a more detailed image of your heart. Once the TEE has determined that a blood clot is not present, the cardioversion begins. Electrical Cardioversion uses a jolt of electricity to your heart either through paddles or wired patches attached to your chest. This is a controlled, usually prescheduled, procedure. This procedure is done at the hospital and you are not awake during the procedure. You usually go home the day of the procedure. Please see the instruction sheet given to you today for more information.

## 2014-12-10 NOTE — Assessment & Plan Note (Signed)
Also with h/o atrial fibrillation; plan as outlined under atrial flutter.

## 2014-12-10 NOTE — Progress Notes (Signed)
HPI: FU AFib (was maintaining NSR on Propafenone), prior pulmonary embolism, chronic coumadin, diastolic dysfunction, morbid obesity, COPD, LE edema. Cardiac cath in 2003 was normal. Echo in 11/13 demonstrated normal LVF. Nuclear study March 2015 showed an ejection fraction 61% and normal perfusion. Patient seen recently and noted to be in recurrent atrial flutter. She was scheduled for cardioversion yesterday but the procedure was canceled as her INR was subtherapeutic. Since she was last seen She notes palpitations and increased dyspnea on exertion. There is no pedal edema, chest pain or syncope.  Current Outpatient Prescriptions  Medication Sig Dispense Refill  . Aclidinium Bromide (TUDORZA PRESSAIR) 400 MCG/ACT AEPB Inhale 1 puff into the lungs 2 (two) times daily. 3 each 1  . atorvastatin (LIPITOR) 10 MG tablet Take 1 tablet (10 mg total) by mouth daily. 90 tablet 2  . budesonide-formoterol (SYMBICORT) 160-4.5 MCG/ACT inhaler USE 2 INHALATIONS TWICE A DAY (Patient taking differently: Inhale 1 puff into the lungs 2 (two) times daily. ) 3 Inhaler 3  . Calcium Carbonate-Vitamin D 600-400 MG-UNIT per tablet Take 1 tablet by mouth 2 (two) times daily.      . cetirizine (ZYRTEC) 10 MG tablet Take 10 mg by mouth at bedtime as needed for allergies.     . cholecalciferol (VITAMIN D) 1000 UNITS tablet Take 1,000 Units by mouth every morning.    Marland Kitchen Dextromethorphan-Guaifenesin (MUCINEX DM MAXIMUM STRENGTH) 60-1200 MG TB12 Take 1 tablet by mouth every 12 (twelve) hours as needed (cough/congestion).    Marland Kitchen diltiazem (CARDIZEM CD) 240 MG 24 hr capsule Take 1 capsule (240 mg total) by mouth daily. 15 capsule 0  . FLUoxetine (PROZAC) 40 MG capsule TAKE 1 CAPSULE DAILY (Patient taking differently: TAKE 40 MG BY MOUTH DAILY) 90 capsule 1  . furosemide (LASIX) 80 MG tablet Take 1 and 1/2 tablets in the morning and 1 tablet in the afternoon (Patient taking differently: Take 80-120 mg by mouth 2 (two) times  daily. Take 120 mg by mouth in the morning and take 80 mg by mouth at night) 225 tablet 3  . glucosamine-chondroitin 500-400 MG tablet Take 1 tablet by mouth every morning.     Marland Kitchen HYDROMET 5-1.5 MG/5ML syrup Take 5 mLs by mouth daily as needed for cough.   0  . KLOR-CON M20 20 MEQ tablet Take 1 tablet (20 mEq total) by mouth 2 (two) times daily. 180 tablet 1  . levalbuterol (XOPENEX) 1.25 MG/3ML nebulizer solution Take 1.25 mg by nebulization every 4 (four) hours as needed for wheezing or shortness of breath.    . mometasone (NASONEX) 50 MCG/ACT nasal spray Place 2 sprays into the nose 2 (two) times daily as needed (For nasal congestion.).     Marland Kitchen omeprazole (PRILOSEC) 20 MG capsule TAKE 2 CAPSULES DAILY (Patient taking differently: Take 20 mg by mouth at night) 180 capsule 2  . oxymetazoline (AFRIN) 0.05 % nasal spray Place 1 spray into both nostrils 2 (two) times daily as needed for congestion.    Marland Kitchen PROAIR HFA 108 (90 BASE) MCG/ACT inhaler INHALE 2 PUFFS EVERY FOUR HOURS AS NEEDED (Patient taking differently: INHALE 1 PUFF EVERY FOUR HOURS AS NEEDED FOR SHORTNESS OF BREATH) 3 each 0  . propafenone (RYTHMOL SR) 325 MG 12 hr capsule TAKE 1 CAPSULE TWICE A DAY (Patient taking differently: Take 325 mg by mouth twice daily) 180 capsule 2  . sodium chloride (OCEAN) 0.65 % SOLN nasal spray Place 2 sprays into both nostrils 2 (two) times  daily as needed for congestion.     Marland Kitchen warfarin (COUMADIN) 7.5 MG tablet TAKE AS DIRECTED BY COUMADIN CLINIC (Patient taking differently: Take 7.5-11.25 mg by mouth daily. Take 7.5 mg by mouth every day except take 11.25mg  by mouth on Wednesday and Saturday.) 120 tablet 1   No current facility-administered medications for this visit.     Past Medical History  Diagnosis Date  . Chronic rhinitis   . Morbid obesity ([place])     target wt=179lb for BMI<30 Peak wt 282lb  . History of DVT (deep vein thrombosis)   . COPD (chronic obstructive pulmonary disease) ([place])     [place].  PFTs 07/25/06 FEV1 55% ratio 53, DLC0 51% HFA 50% 12/16/2009  . Depression   . Osteoporosis     last BMD 4/08 -2.4, intolerant of bisphosphonates  . Hypertension   . Atrial fibrillation and flutter ([place])   . Anxiety   . Glucose intolerance (impaired glucose tolerance)     on steroids  . VITAMIN D DEFICIENCY   . HYPERLIPIDEMIA   . PREMATURE VENTRICULAR CONTRACTIONS   . Rhabdomyolysis 07/29/2009  . FRACTURE, RIB, RIGHT 06/18/2009  . PULMONARY EMBOLISM, HX OF 06/19/2007  . Tremor   . Hx of cardiac catheterization     LHC (01/2001):  Normal Cors.  EF 65%.;  LexiScan Myoview (03/2013): No ischemia, EF 61%, normal  . Hx of echocardiogram     Echo (11/2011):  Mod LVH, EF 60-65%, no RWMA, MAC, mild LAE, PASP 34.  . Asthma   . CHF (congestive heart failure) [facility])     Past Surgical History  Procedure Laterality Date  . Abdominal hysterectomy    . Tonsillectomy    . Skin graft to middle r finger  1975  . S/p left arm fracture with fall off chair    . Colonoscopy  10/22/2003, ?2006    Social History   Social History  . Marital Status: Married    Spouse Name: N/A  . Number of Children: 3  . Years of Education: N/A   Occupational History  . 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 Smoking status: Former Smoker -- 1.00 packs/day for 35 years    Types: Cigarettes    Quit date: 01/12/1996  . Smokeless tobacco: Never Used  . Alcohol Use: No  . Drug Use: No  . Sexual Activity: Not on file   Other Topics Concern  . Not on file   Social History Narrative    ROS: no fevers or chills, productive cough, hemoptysis, dysphasia, odynophagia, melena, hematochezia, dysuria, hematuria, rash, seizure activity, orthopnea, PND, pedal edema, claudication. Remaining systems are negative.  Physical Exam: Well-developed obese in no acute distress.  Skin is warm and dry.  HEENT is normal.  Neck is supple.  Chest is clear to auscultation with normal expansion.    Cardiovascular exam is tachycardic and irregular Abdominal exam nontender or distended. No masses palpated. Extremities show no edema. neuro grossly intact  ECG Atrial flutter at a rate of 133. Nonspecific ST changes.

## 2014-12-11 ENCOUNTER — Other Ambulatory Visit: Payer: Self-pay

## 2014-12-11 ENCOUNTER — Ambulatory Visit (HOSPITAL_COMMUNITY): Payer: BLUE CROSS/BLUE SHIELD | Admitting: Certified Registered Nurse Anesthetist

## 2014-12-11 ENCOUNTER — Encounter (HOSPITAL_COMMUNITY): Payer: Self-pay

## 2014-12-11 ENCOUNTER — Encounter (HOSPITAL_COMMUNITY)
Admission: RE | Disposition: A | Discharge status: Home / Self Care | Payer: Self-pay | Source: Ambulatory Visit | Attending: Cardiovascular Disease

## 2014-12-11 ENCOUNTER — Ambulatory Visit (HOSPITAL_COMMUNITY): Payer: BLUE CROSS/BLUE SHIELD

## 2014-12-11 ENCOUNTER — Ambulatory Visit (HOSPITAL_COMMUNITY)
Admission: RE | Admit: 2014-12-11 | Discharge: 2014-12-11 | Disposition: A | Discharge status: Home / Self Care | Payer: BLUE CROSS/BLUE SHIELD | Source: Ambulatory Visit | Attending: Cardiovascular Disease | Admitting: Cardiovascular Disease

## 2014-12-11 DIAGNOSIS — I4891 Unspecified atrial fibrillation: Secondary | ICD-10-CM | POA: Insufficient documentation

## 2014-12-11 DIAGNOSIS — I10 Essential (primary) hypertension: Secondary | ICD-10-CM | POA: Diagnosis not present

## 2014-12-11 DIAGNOSIS — Z7901 Long term (current) use of anticoagulants: Secondary | ICD-10-CM | POA: Diagnosis not present

## 2014-12-11 DIAGNOSIS — J45909 Unspecified asthma, uncomplicated: Secondary | ICD-10-CM | POA: Insufficient documentation

## 2014-12-11 DIAGNOSIS — E785 Hyperlipidemia, unspecified: Secondary | ICD-10-CM | POA: Diagnosis not present

## 2014-12-11 DIAGNOSIS — J449 Chronic obstructive pulmonary disease, unspecified: Secondary | ICD-10-CM | POA: Insufficient documentation

## 2014-12-11 DIAGNOSIS — I2782 Chronic pulmonary embolism: Secondary | ICD-10-CM | POA: Diagnosis not present

## 2014-12-11 DIAGNOSIS — I340 Nonrheumatic mitral (valve) insufficiency: Secondary | ICD-10-CM | POA: Diagnosis not present

## 2014-12-11 DIAGNOSIS — Z87891 Personal history of nicotine dependence: Secondary | ICD-10-CM | POA: Diagnosis not present

## 2014-12-11 DIAGNOSIS — I4892 Unspecified atrial flutter: Secondary | ICD-10-CM

## 2014-12-11 DIAGNOSIS — Z79899 Other long term (current) drug therapy: Secondary | ICD-10-CM | POA: Diagnosis not present

## 2014-12-11 DIAGNOSIS — M1990 Unspecified osteoarthritis, unspecified site: Secondary | ICD-10-CM | POA: Diagnosis not present

## 2014-12-11 DIAGNOSIS — Z86718 Personal history of other venous thrombosis and embolism: Secondary | ICD-10-CM | POA: Insufficient documentation

## 2014-12-11 HISTORY — PX: CARDIOVERSION: SHX1299

## 2014-12-11 HISTORY — PX: TEE WITHOUT CARDIOVERSION: SHX5443

## 2014-12-11 LAB — PROTIME-INR
INR: 2.45 — ABNORMAL HIGH (ref 0.00–1.49)
Prothrombin Time: 26.3 seconds — ABNORMAL HIGH (ref 11.6–15.2)

## 2014-12-11 SURGERY — CARDIOVERSION
Anesthesia: Monitor Anesthesia Care

## 2014-12-11 MED ORDER — SODIUM CHLORIDE 0.9 % IJ SOLN: 3.0000 mL | Freq: Two times a day (BID) | INTRAMUSCULAR | Status: DC

## 2014-12-11 MED ORDER — SODIUM CHLORIDE 0.9 % IJ SOLN: 3.0000 mL | INTRAMUSCULAR | Status: DC | PRN

## 2014-12-11 MED ORDER — SODIUM CHLORIDE 0.9 % IV SOLN
250.0000 mL | INTRAVENOUS | Status: DC
  Administered 2014-12-11: 14:00:00 via INTRAVENOUS

## 2014-12-11 MED ORDER — BUTAMBEN-TETRACAINE-BENZOCAINE 2-2-14 % EX AERO
INHALATION_SPRAY | CUTANEOUS | Status: DC | PRN
  Administered 2014-12-11: 2 via TOPICAL

## 2014-12-11 MED ORDER — PROPOFOL 500 MG/50ML IV EMUL
INTRAVENOUS | Status: DC | PRN
  Administered 2014-12-11: 35 ug/kg/min via INTRAVENOUS

## 2014-12-11 MED ORDER — PROPOFOL 10 MG/ML IV BOLUS
INTRAVENOUS | Status: DC | PRN
  Administered 2014-12-11: 20 mg via INTRAVENOUS
  Administered 2014-12-11: 30 mg via INTRAVENOUS
  Administered 2014-12-11: 20 mg via INTRAVENOUS

## 2014-12-11 MED ORDER — ONDANSETRON HCL 4 MG/2ML IJ SOLN
INTRAMUSCULAR | Status: DC | PRN
  Administered 2014-12-11: 4 mg via INTRAVENOUS

## 2014-12-11 MED ORDER — SODIUM CHLORIDE 0.9 % IV SOLN
INTRAVENOUS | Status: DC
  Administered 2014-12-11: 500 mL via INTRAVENOUS

## 2014-12-11 NOTE — Transfer of Care (Signed)
Immediate Anesthesia Transfer of Care Note  Patient: [name] [name]  Procedure(s) Performed: Procedure(s): CARDIOVERSION (N/A) TRANSESOPHAGEAL ECHOCARDIOGRAM (TEE) (N/A)  Patient Location: PACU  Anesthesia Type:MAC  Level of Consciousness: awake, alert , oriented and patient cooperative  Airway & Oxygen Therapy: Patient Spontanous Breathing and Patient connected to nasal cannula oxygen  Post-op Assessment: Report given to RN, Post -op Vital signs reviewed and stable, Patient moving all extremities and Patient moving all extremities X 4  Post vital signs: Reviewed and stable  Last Vitals:  Filed Vitals:   12/11/14 1254 12/11/14 1438  BP: 119/94 121/72  Pulse: 133 84  Temp: 36.6 C 36.7 C  Resp: 19 18    Complications: No apparent anesthesia complications

## 2014-12-11 NOTE — H&P (View-Only) (Signed)
HPI: FU AFib (was maintaining NSR on Propafenone), prior pulmonary embolism, chronic coumadin, diastolic dysfunction, morbid obesity, COPD, LE edema. Cardiac cath in 2003 was normal. Echo in 11/13 demonstrated normal LVF. Nuclear study March 2015 showed an ejection fraction 61% and normal perfusion. Patient seen recently and noted to be in recurrent atrial flutter. She was scheduled for cardioversion yesterday but the procedure was canceled as her INR was subtherapeutic. Since she was last seen She notes palpitations and increased dyspnea on exertion. There is no pedal edema, chest pain or syncope.  Current Outpatient Prescriptions  Medication Sig Dispense Refill  . Aclidinium Bromide (TUDORZA PRESSAIR) 400 MCG/ACT AEPB Inhale 1 puff into the lungs 2 (two) times daily. 3 each 1  . atorvastatin (LIPITOR) 10 MG tablet Take 1 tablet (10 mg total) by mouth daily. 90 tablet 2  . budesonide-formoterol (SYMBICORT) 160-4.5 MCG/ACT inhaler USE 2 INHALATIONS TWICE A DAY (Patient taking differently: Inhale 1 puff into the lungs 2 (two) times daily. ) 3 Inhaler 3  . Calcium Carbonate-Vitamin D 600-400 MG-UNIT per tablet Take 1 tablet by mouth 2 (two) times daily.      . cetirizine (ZYRTEC) 10 MG tablet Take 10 mg by mouth at bedtime as needed for allergies.     . cholecalciferol (VITAMIN D) 1000 UNITS tablet Take 1,000 Units by mouth every morning.    Marland Kitchen Dextromethorphan-Guaifenesin (MUCINEX DM MAXIMUM STRENGTH) 60-1200 MG TB12 Take 1 tablet by mouth every 12 (twelve) hours as needed (cough/congestion).    Marland Kitchen diltiazem (CARDIZEM CD) 240 MG 24 hr capsule Take 1 capsule (240 mg total) by mouth daily. 15 capsule 0  . FLUoxetine (PROZAC) 40 MG capsule TAKE 1 CAPSULE DAILY (Patient taking differently: TAKE 40 MG BY MOUTH DAILY) 90 capsule 1  . furosemide (LASIX) 80 MG tablet Take 1 and 1/2 tablets in the morning and 1 tablet in the afternoon (Patient taking differently: Take 80-120 mg by mouth 2 (two) times  daily. Take 120 mg by mouth in the morning and take 80 mg by mouth at night) 225 tablet 3  . glucosamine-chondroitin 500-400 MG tablet Take 1 tablet by mouth every morning.     Marland Kitchen HYDROMET 5-1.5 MG/5ML syrup Take 5 mLs by mouth daily as needed for cough.   0  . KLOR-CON M20 20 MEQ tablet Take 1 tablet (20 mEq total) by mouth 2 (two) times daily. 180 tablet 1  . levalbuterol (XOPENEX) 1.25 MG/3ML nebulizer solution Take 1.25 mg by nebulization every 4 (four) hours as needed for wheezing or shortness of breath.    . mometasone (NASONEX) 50 MCG/ACT nasal spray Place 2 sprays into the nose 2 (two) times daily as needed (For nasal congestion.).     Marland Kitchen omeprazole (PRILOSEC) 20 MG capsule TAKE 2 CAPSULES DAILY (Patient taking differently: Take 20 mg by mouth at night) 180 capsule 2  . oxymetazoline (AFRIN) 0.05 % nasal spray Place 1 spray into both nostrils 2 (two) times daily as needed for congestion.    Marland Kitchen PROAIR HFA 108 (90 BASE) MCG/ACT inhaler INHALE 2 PUFFS EVERY FOUR HOURS AS NEEDED (Patient taking differently: INHALE 1 PUFF EVERY FOUR HOURS AS NEEDED FOR SHORTNESS OF BREATH) 3 each 0  . propafenone (RYTHMOL SR) 325 MG 12 hr capsule TAKE 1 CAPSULE TWICE A DAY (Patient taking differently: Take 325 mg by mouth twice daily) 180 capsule 2  . sodium chloride (OCEAN) 0.65 % SOLN nasal spray Place 2 sprays into both nostrils 2 (two) times  daily as needed for congestion.     Marland Kitchen warfarin (COUMADIN) 7.5 MG tablet TAKE AS DIRECTED BY COUMADIN CLINIC (Patient taking differently: Take 7.5-11.25 mg by mouth daily. Take 7.5 mg by mouth every day except take 11.25mg  by mouth on Wednesday and Saturday.) 120 tablet 1   No current facility-administered medications for this visit.     Past Medical History  Diagnosis Date  . Chronic rhinitis   . Morbid obesity ([place])     target wt=179lb for BMI<30 Peak wt 282lb  . History of DVT (deep vein thrombosis)   . COPD (chronic obstructive pulmonary disease) ([place])     [place]ert.  PFTs 07/25/06 FEV1 55% ratio 53, DLC0 51% HFA 50% 12/16/2009  . Depression   . Osteoporosis     last BMD 4/08 -2.4, intolerant of bisphosphonates  . Hypertension   . Atrial fibrillation and flutter ([place])   . Anxiety   . Glucose intolerance (impaired glucose tolerance)     on steroids  . VITAMIN D DEFICIENCY   . HYPERLIPIDEMIA   . PREMATURE VENTRICULAR CONTRACTIONS   . Rhabdomyolysis 07/29/2009  . FRACTURE, RIB, RIGHT 06/18/2009  . PULMONARY EMBOLISM, HX OF 06/19/2007  . Tremor   . Hx of cardiac catheterization     LHC (01/2001):  Normal Cors.  EF 65%.;  LexiScan Myoview (03/2013): No ischemia, EF 61%, normal  . Hx of echocardiogram     Echo (11/2011):  Mod LVH, EF 60-65%, no RWMA, MAC, mild LAE, PASP 34.  . Asthma   . CHF (congestive heart failure) [facility])     Past Surgical History  Procedure Laterality Date  . Abdominal hysterectomy    . Tonsillectomy    . Skin graft to middle r finger  1975  . S/p left arm fracture with fall off chair    . Colonoscopy  10/22/2003, ?2006    Social History   Social History  . Marital Status: Married    Spouse Name: N/A  . Number of Children: 3  . Years of Education: N/A   Occupational History  . 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 Smoking status: Former Smoker -- 1.00 packs/day for 35 years    Types: Cigarettes    Quit date: 01/12/1996  . Smokeless tobacco: Never Used  . Alcohol Use: No  . Drug Use: No  . Sexual Activity: Not on file   Other Topics Concern  . Not on file   Social History Narrative    ROS: no fevers or chills, productive cough, hemoptysis, dysphasia, odynophagia, melena, hematochezia, dysuria, hematuria, rash, seizure activity, orthopnea, PND, pedal edema, claudication. Remaining systems are negative.  Physical Exam: Well-developed obese in no acute distress.  Skin is warm and dry.  HEENT is normal.  Neck is supple.  Chest is clear to auscultation with normal expansion.    Cardiovascular exam is tachycardic and irregular Abdominal exam nontender or distended. No masses palpated. Extremities show no edema. neuro grossly intact  ECG Atrial flutter at a rate of 133. Nonspecific ST changes.

## 2014-12-11 NOTE — Progress Notes (Signed)
  Echocardiogram 2D Echocardiogram has been performed.  [name] [name] 12/11/2014, 2:46 PM

## 2014-12-11 NOTE — Anesthesia Procedure Notes (Signed)
Procedure Name: MAC Date/Time: 12/11/2014 2:03 PM Performed by: Layla Maw Pre-anesthesia Checklist: Patient identified, Emergency Drugs available, Suction available, Patient being monitored and Timeout performed Patient Re-evaluated:Patient Re-evaluated prior to inductionOxygen Delivery Method: Nasal cannula Intubation Type: IV induction Number of attempts: 1

## 2014-12-11 NOTE — Interval H&P Note (Signed)
History and Physical Interval Note:  [id number] PM  [name] [name]  has presented today for surgery, with the diagnosis of aflutter  The various methods of treatment have been discussed with the patient and family. After consideration of risks, benefits and other options for treatment, the patient has consented to  Procedure(s): CARDIOVERSION (N/A) TRANSESOPHAGEAL ECHOCARDIOGRAM (TEE) (N/A) as a surgical intervention .  The patient's history has been reviewed, patient examined, no change in status, stable for surgery.  I have reviewed the patient's chart and labs.  Questions were answered to the patient's satisfaction.     [name]

## 2014-12-11 NOTE — CV Procedure (Signed)
TEE/DCC: Pateint was somewhat tenuous due to rapid HR, nausea and morbid obesity.  Given zofran prior to procedure INR Rx 2.45 Anesthesia Dr Oletta Lamas 150 propofol as drip and bolus.    Normal EF 60% Mild MR Mild LAE Mild RAE No LAA thrombus Atrial septal aneurysm with PFO Normal RV Normal AV No Effusion  DCC x1 200J synchronized biphasic.  Converted from atrial flutter rate 122 to NSR rate 80  Successful TEE/DCC with no immediate neurologic sequelae.  INR Rx 2.45  [name]

## 2014-12-11 NOTE — Discharge Instructions (Signed)
Electrical Cardioversion, Care After °Refer to this sheet in the next few weeks. These instructions provide you with information on caring for yourself after your procedure. Your health care provider may also give you more specific instructions. Your treatment has been planned according to current medical practices, but problems sometimes occur. Call your health care provider if you have any problems or questions after your procedure. °WHAT TO EXPECT AFTER THE PROCEDURE °After your procedure, it is typical to have the following sensations: °· Some redness on the skin where the shocks were delivered. If this is tender, a sunburn lotion or hydrocortisone cream may help. °· Possible return of an abnormal heart rhythm within hours or days after the procedure. °HOME CARE INSTRUCTIONS °· Take medicines only as directed by your health care provider. Be sure you understand how and when to take your medicine. °· Learn how to feel your pulse and check it often. °· Limit your activity for 48 hours after the procedure or as directed by your health care provider. °· Avoid or minimize caffeine and other stimulants as directed by your health care provider. °SEEK MEDICAL CARE IF: °· You feel like your heart is beating too fast or your pulse is not regular. °· You have any questions about your medicines. °· You have bleeding that will not stop. °SEEK IMMEDIATE MEDICAL CARE IF: °· You are dizzy or feel faint. °· It is hard to breathe or you feel short of breath. °· There is a change in discomfort in your chest. °· Your speech is slurred or you have trouble moving an arm or leg on one side of your body. °· You get a serious muscle cramp that does not go away. °· Your fingers or toes turn cold or blue. °  °This information is not intended to replace advice given to you by your health care provider. Make sure you discuss any questions you have with your health care provider. °  °Document Released: 10/18/2012 Document Revised: 01/18/2014  Document Reviewed: 10/18/2012 °Elsevier Interactive Patient Education ©2016 [employer] °Transesophageal Echocardiogram °Transesophageal echocardiography (TEE) is a picture test of your heart using sound waves. The pictures taken can give very detailed pictures of your heart. This can help your doctor see if there are problems with your heart. TEE can check: °· If your heart has blood clots in it. °· How well your heart valves are working. °· If you have an infection on the inside of your heart. °· Some of the major arteries of your heart. °· If your heart valve is working after a repair. °· Your heart before a procedure that uses a shock to your heart to get the rhythm back to normal. °BEFORE THE PROCEDURE °· Do not eat or drink for 6 hours before the procedure or as told by your doctor. °· Make plans to have someone drive you home after the procedure. Do not drive yourself home. °· An IV tube will be put in your arm. °PROCEDURE °· You will be given a medicine to help you relax (sedative). It will be given through the IV tube. °· A numbing medicine will be sprayed or gargled in the back of your throat to help numb it. °· The tip of the probe is placed into the back of your mouth. You will be asked to swallow. This helps to pass the probe into your esophagus. °· Once the tip of the probe is in the right place, your doctor can take pictures of your heart. °· You   may feel pressure at the back of your throat. °AFTER THE PROCEDURE °· You will be taken to a recovery area so the sedative can wear off. °· Your throat may be sore and scratchy. This will go away slowly over time. °· You will go home when you are fully awake and able to swallow liquids. °· You should have someone stay with you for the next 24 hours. °· Do not drive or operate machinery for the next 24 hours. °  °This information is not intended to replace advice given to you by your health care provider. Make sure you discuss any questions you have with  your health care provider. °  °Document Released: 10/25/2008 Document Revised: 01/02/2013 Document Reviewed: 06/29/2012 °Elsevier Interactive Patient Education ©2016 Elsevier Inc. ° °

## 2014-12-11 NOTE — Anesthesia Postprocedure Evaluation (Signed)
Anesthesia Post Note  Patient: [name] [name]  Procedure(s) Performed: Procedure(s) (LRB): CARDIOVERSION (N/A) TRANSESOPHAGEAL ECHOCARDIOGRAM (TEE) (N/A)  Patient location during evaluation: Endoscopy Anesthesia Type: MAC Level of consciousness: awake Pain management: pain level controlled Vital Signs Assessment: post-procedure vital signs reviewed and stable Respiratory status: spontaneous breathing Cardiovascular status: blood pressure returned to baseline Anesthetic complications: no    Last Vitals:  Filed Vitals:   12/11/14 1450 12/11/14 1500  BP: 118/93 130/67  Pulse: 83 87  Temp:    Resp: 16 19    Last Pain: There were no vitals filed for this visit.               EDWARDS,Anshi Jalloh

## 2014-12-11 NOTE — Anesthesia Preprocedure Evaluation (Addendum)
Anesthesia Evaluation  Patient identified by MRN, date of birth, ID band Patient awake    Reviewed: Allergy & Precautions, NPO status , Patient's Chart, lab work & pertinent test results  History of Anesthesia Complications Negative for: history of anesthetic complications  Airway Mallampati: I  TM Distance: >3 FB Neck ROM: Full    Dental  (+) Missing, Poor Dentition   Pulmonary asthma , COPD,  COPD inhaler, Recent URI , Residual Cough, former smoker, PE    + decreased breath sounds      Cardiovascular Exercise Tolerance: Poor hypertension, Pt. on medications +CHF  + dysrhythmias Atrial Fibrillation  Rhythm:Irregular     Neuro/Psych PSYCHIATRIC DISORDERS Anxiety Depression  Neuromuscular disease    GI/Hepatic Neg liver ROS, GERD  Medicated and Controlled,  Endo/Other  Morbid obesity  Renal/GU negative Renal ROS     Musculoskeletal  (+) Arthritis ,   Abdominal (+) + obese,  Abdomen: soft. Bowel sounds: normal.  Peds  Hematology   Anesthesia Other Findings   Reproductive/Obstetrics                         BP Readings from Last 3 Encounters:  12/10/14 136/90  12/04/14 128/70  11/26/14 118/72   Lab Results  Component Value Date   WBC 12.2* 12/04/2014   HGB 13.0 12/04/2014   HCT 37.3 12/04/2014   MCV 82.7 12/04/2014   PLT 325 12/04/2014   Lab Results  Component Value Date   HGBA1C 5.2 01/31/2014     Chemistry      Component Value Date/Time   NA 143 12/04/2014 1258   K 4.1 12/04/2014 1258   CL 100 12/04/2014 1258   CO2 29 12/04/2014 1258   BUN 12 12/04/2014 1258   CREATININE 0.83 12/04/2014 1258   CREATININE 0.73 04/08/2014 1640      Component Value Date/Time   CALCIUM 9.1 12/04/2014 1258   ALKPHOS 90 04/08/2014 1640   AST 16 04/08/2014 1640   ALT 15 04/08/2014 1640   BILITOT 0.8 04/08/2014 1640     Echo 11/16/14: Study Conclusions  - Left ventricle: The cavity size  was normal. Wall thickness was increased in a pattern of moderate LVH. Systolic function was normal. The estimated ejection fraction was in the range of 60% to 65%. Wall motion was normal; there were no regional wall motion abnormalities. - Mitral valve: Calcified annulus. - Left atrium: The atrium was mildly dilated. - Pulmonary arteries: PA peak pressure: 53mm Hg (S).  Anesthesia Physical Anesthesia Plan  ASA: III  Anesthesia Plan: MAC   Post-op Pain Management:    Induction: Intravenous  Airway Management Planned: Simple Face Mask and Nasal Cannula  Additional Equipment:   Intra-op Plan:   Post-operative Plan:   Informed Consent: I have reviewed the patients History and Physical, chart, labs and discussed the procedure including the risks, benefits and alternatives for the proposed anesthesia with the patient or authorized representative who has indicated his/her understanding and acceptance.   Dental advisory given  Plan Discussed with: CRNA and Anesthesiologist  Anesthesia Plan Comments:         Anesthesia Quick Evaluation

## 2014-12-12 ENCOUNTER — Encounter (HOSPITAL_COMMUNITY): Payer: Self-pay | Admitting: Cardiovascular Disease

## 2015-01-08 ENCOUNTER — Ambulatory Visit (INDEPENDENT_AMBULATORY_CARE_PROVIDER_SITE_OTHER): Payer: BLUE CROSS/BLUE SHIELD | Admitting: General Practice

## 2015-01-08 DIAGNOSIS — Z8672 Personal history of thrombophlebitis: Secondary | ICD-10-CM

## 2015-01-08 DIAGNOSIS — I4891 Unspecified atrial fibrillation: Secondary | ICD-10-CM | POA: Diagnosis not present

## 2015-01-08 DIAGNOSIS — Z5181 Encounter for therapeutic drug level monitoring: Secondary | ICD-10-CM

## 2015-01-08 DIAGNOSIS — Z86718 Personal history of other venous thrombosis and embolism: Secondary | ICD-10-CM | POA: Diagnosis not present

## 2015-01-08 DIAGNOSIS — Z7901 Long term (current) use of anticoagulants: Secondary | ICD-10-CM

## 2015-01-08 DIAGNOSIS — I4892 Unspecified atrial flutter: Secondary | ICD-10-CM | POA: Diagnosis not present

## 2015-01-08 LAB — POCT INR: INR: 2.4

## 2015-01-08 NOTE — Progress Notes (Signed)
Pre visit review using our clinic review tool, if applicable. No additional management support is needed unless otherwise documented below in the visit note. 

## 2015-01-08 NOTE — Progress Notes (Signed)
I have reviewed and agree with the plan. 

## 2015-01-27 ENCOUNTER — Other Ambulatory Visit: Payer: Self-pay | Admitting: Cardiology

## 2015-01-27 MED ORDER — PROPAFENONE HCL ER 325 MG PO CP12
ORAL_CAPSULE | ORAL | Status: DC
Start: 2015-01-27 — End: 2015-04-09

## 2015-01-30 ENCOUNTER — Other Ambulatory Visit: Payer: Self-pay | Admitting: General Practice

## 2015-01-30 ENCOUNTER — Other Ambulatory Visit: Payer: Self-pay

## 2015-01-30 DIAGNOSIS — I4891 Unspecified atrial fibrillation: Secondary | ICD-10-CM

## 2015-01-30 MED ORDER — WARFARIN SODIUM 7.5 MG PO TABS: ORAL_TABLET | ORAL | Status: DC

## 2015-02-06 NOTE — Progress Notes (Signed)
HPI: FU AFib/flutter, prior pulmonary embolism, chronic coumadin, diastolic dysfunction, morbid obesity, COPD, LE edema. Cardiac cath in 2003 was normal. Echo in 11/13 demonstrated normal LVF. Nuclear study March 2015 showed an ejection fraction 61% and normal perfusion. Seen November 2016 by Dr. Martinique and noted to be in atrial flutter. Subsequently had TEE cardioversion 11/16; TEE revealed normal LV function; mild MR; mild biatrial enlargement. Since she was last seen She does have dyspnea on exertion but no orthopnea, PND, pedal edema, chest pain or syncope. No bleeding.  Current Outpatient Prescriptions  Medication Sig Dispense Refill  . Aclidinium Bromide (TUDORZA PRESSAIR) 400 MCG/ACT AEPB Inhale 1 puff into the lungs 2 (two) times daily. 3 each 1  . albuterol (PROVENTIL HFA;VENTOLIN HFA) 108 (90 Base) MCG/ACT inhaler Inhale 2 puffs into the lungs every 4 (four) hours as needed for wheezing or shortness of breath.    Marland Kitchen atorvastatin (LIPITOR) 10 MG tablet Take 1 tablet (10 mg total) by mouth daily. 90 tablet 2  . budesonide-formoterol (SYMBICORT) 160-4.5 MCG/ACT inhaler Inhale 2 puffs into the lungs 2 (two) times daily.    . Calcium Carbonate-Vitamin D 600-400 MG-UNIT per tablet Take 1 tablet by mouth 2 (two) times daily.      . cetirizine (ZYRTEC) 10 MG tablet Take 10 mg by mouth at bedtime as needed for allergies.     . cholecalciferol (VITAMIN D) 1000 UNITS tablet Take 1,000 Units by mouth every morning.    Marland Kitchen Dextromethorphan-Guaifenesin (MUCINEX DM MAXIMUM STRENGTH) 60-1200 MG TB12 Take 1 tablet by mouth every 12 (twelve) hours as needed (cough/congestion).    Marland Kitchen diltiazem (CARDIZEM CD) 240 MG 24 hr capsule Take 1 capsule (240 mg total) by mouth daily. 15 capsule 0  . FLUoxetine (PROZAC) 40 MG capsule Take 40 mg by mouth daily.    . furosemide (LASIX) 80 MG tablet Take one and half (1.5) tablet (120 mg total) by mouth every morning and take one (1) tablet (80 mg total) by mouth in the  afternoon.    Marland Kitchen glucosamine-chondroitin 500-400 MG tablet Take 1 tablet by mouth every morning.     Marland Kitchen HYDROMET 5-1.5 MG/5ML syrup Take 5 mLs by mouth daily as needed for cough.   0  . KLOR-CON M20 20 MEQ tablet Take 1 tablet (20 mEq total) by mouth 2 (two) times daily. 180 tablet 1  . levalbuterol (XOPENEX) 1.25 MG/3ML nebulizer solution Take 1.25 mg by nebulization every 4 (four) hours as needed for wheezing or shortness of breath.    . mometasone (NASONEX) 50 MCG/ACT nasal spray Place 2 sprays into the nose 2 (two) times daily as needed (For nasal congestion.).     Marland Kitchen omeprazole (PRILOSEC) 20 MG capsule Take 40 mg by mouth daily.    . propafenone (RYTHMOL SR) 325 MG 12 hr capsule Take 325 mg by mouth twice daily 180 capsule 0  . sodium chloride (OCEAN) 0.65 % SOLN nasal spray Place 2 sprays into both nostrils 2 (two) times daily as needed for congestion.     Marland Kitchen warfarin (COUMADIN) 7.5 MG tablet TAKE AS DIRECTED BY COUMADIN CLINIC 120 tablet 1   No current facility-administered medications for this visit.     Past Medical History  Diagnosis Date  . Chronic rhinitis   . Morbid obesity ([place])     target wt=179lb for BMI<30 Peak wt 282lb  . History of DVT (deep vein thrombosis)   . COPD (chronic obstructive pulmonary disease) ([place])  [place]. PFTs 07/25/06 FEV1 55% ratio 53, DLC0 51% HFA 50% 12/16/2009  . Depression   . Osteoporosis     last BMD 4/08 -2.4, intolerant of bisphosphonates  . Hypertension   . Atrial fibrillation and flutter ([place])   . Anxiety   . Glucose intolerance (impaired glucose tolerance)     on steroids  . VITAMIN D DEFICIENCY   . HYPERLIPIDEMIA   . PREMATURE VENTRICULAR CONTRACTIONS   . Rhabdomyolysis 07/29/2009  . FRACTURE, RIB, RIGHT 06/18/2009  . PULMONARY EMBOLISM, HX OF 06/19/2007  . Tremor   . Hx of cardiac catheterization     LHC (01/2001):  Normal Cors.  EF 65%.;  LexiScan Myoview (03/2013): No ischemia, EF 61%, normal  . Hx of echocardiogram     Echo (11/2011):   Mod LVH, EF 60-65%, no RWMA, MAC, mild LAE, PASP 34.  . Asthma   . CHF (congestive heart failure) [facility])     Past Surgical History  Procedure Laterality Date  . Abdominal hysterectomy    . Tonsillectomy    . Skin graft to middle r finger  1975  . S/p left arm fracture with fall off chair    . Colonoscopy  10/22/2003, ?2006  . Cardioversion N/A 12/11/2014    Procedure: CARDIOVERSION;  Surgeon: Josue Hector, MD;  Location[facility] ENDOSCOPY;  Service: Cardiovascular;  Laterality: N/A;  . Tee without cardioversion N/A 12/11/2014    Procedure: TRANSESOPHAGEAL ECHOCARDIOGRAM (TEE);  Surgeon: Josue Hector, MD;  Location: [facility] ENDOSCOPY;  Service: Cardiovascular;  Laterality: N/A;    Social History   Social History  . Marital Status: Married    Spouse Name: N/A  . Number of Children: 3  . Years of Education: N/A   Occupational History  . 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 Smoking status: Former Smoker -- 1.00 packs/day for 35 years    Types: Cigarettes    Quit date: 01/12/1996  . Smokeless tobacco: Never Used  . Alcohol Use: No  . Drug Use: No  . Sexual Activity: Not on file   Other Topics Concern  . Not on file   Social History Narrative    Family History  Problem Relation Age of Onset  . Heart disease Mother   . Asthma Brother   . Throat cancer Brother   . Asthma Son     ROS: no fevers or chills, productive cough, hemoptysis, dysphasia, odynophagia, melena, hematochezia, dysuria, hematuria, rash, seizure activity, orthopnea, PND, pedal edema, claudication. Remaining systems are negative.  Physical Exam: Well-developed morbidly obese in no acute distress.  Skin is warm and dry.  HEENT is normal.  Neck is supple.  Chest is clear to auscultation with normal expansion.  Cardiovascular exam is regular rate and rhythm.  Abdominal exam nontender or distended. No masses palpated. Extremities show trace edema. neuro grossly  intact  ECG 12/11/2014-sinus rhythm with nonspecific ST changes.

## 2015-02-07 ENCOUNTER — Telehealth: Payer: Self-pay

## 2015-02-07 ENCOUNTER — Ambulatory Visit (INDEPENDENT_AMBULATORY_CARE_PROVIDER_SITE_OTHER): Payer: BLUE CROSS/BLUE SHIELD | Admitting: General Practice

## 2015-02-07 DIAGNOSIS — I4892 Unspecified atrial flutter: Secondary | ICD-10-CM

## 2015-02-07 DIAGNOSIS — Z7901 Long term (current) use of anticoagulants: Secondary | ICD-10-CM | POA: Diagnosis not present

## 2015-02-07 DIAGNOSIS — I4891 Unspecified atrial fibrillation: Secondary | ICD-10-CM | POA: Diagnosis not present

## 2015-02-07 DIAGNOSIS — Z8672 Personal history of thrombophlebitis: Secondary | ICD-10-CM

## 2015-02-07 DIAGNOSIS — Z86718 Personal history of other venous thrombosis and embolism: Secondary | ICD-10-CM | POA: Diagnosis not present

## 2015-02-07 DIAGNOSIS — Z5181 Encounter for therapeutic drug level monitoring: Secondary | ICD-10-CM

## 2015-02-07 LAB — POCT INR: INR: 2.5

## 2015-02-07 NOTE — Progress Notes (Signed)
I have reviewed and agree with the plan. 

## 2015-02-07 NOTE — Progress Notes (Signed)
Pre visit review using our clinic review tool, if applicable. No additional management support is needed unless otherwise documented below in the visit note. 

## 2015-02-07 NOTE — Telephone Encounter (Signed)
FMLA form received, filled out, and placed on MD's desk for signature. Pt to be contacted at [phone] once completed for pick up

## 2015-02-07 NOTE — Telephone Encounter (Signed)
FMLA form completed and placed in cabinet for pt pick up. Pt advised

## 2015-02-10 ENCOUNTER — Encounter: Payer: Self-pay | Admitting: Cardiology

## 2015-02-10 ENCOUNTER — Ambulatory Visit (INDEPENDENT_AMBULATORY_CARE_PROVIDER_SITE_OTHER): Payer: BLUE CROSS/BLUE SHIELD | Admitting: Cardiology

## 2015-02-10 VITALS — BP 160/60 | HR 70 | Ht 64.0 in | Wt 273.8 lb

## 2015-02-10 DIAGNOSIS — I10 Essential (primary) hypertension: Secondary | ICD-10-CM | POA: Diagnosis not present

## 2015-02-10 DIAGNOSIS — I480 Paroxysmal atrial fibrillation: Secondary | ICD-10-CM | POA: Diagnosis not present

## 2015-02-10 DIAGNOSIS — I5033 Acute on chronic diastolic (congestive) heart failure: Secondary | ICD-10-CM | POA: Diagnosis not present

## 2015-02-10 MED ORDER — DILTIAZEM HCL ER COATED BEADS 120 MG PO CP24: 120.0000 mg | ORAL_CAPSULE | Freq: Every day | ORAL | Status: DC

## 2015-02-10 NOTE — Addendum Note (Signed)
Addended by: Cristopher Estimable on: 02/10/2015 12:34 PM   Modules accepted: Orders

## 2015-02-10 NOTE — Assessment & Plan Note (Signed)
Blood pressure elevated. Increase Cardizem to 360 mg daily.

## 2015-02-10 NOTE — Patient Instructions (Signed)
Medication Instructions:   INCREASE DILTIAZEM TO 360 MG ONCE DAILY= 1- 240 MG TABLET AND 1- 120 MG TABLET ONCE DAILY  Follow-Up:  Your physician wants you to follow-up in: [place] will receive a reminder letter in the mail two months in advance. If you don't receive a letter, please call our office to schedule the follow-up appointment.   If you need a refill on your cardiac medications before your next appointment, please call your pharmacy.

## 2015-02-10 NOTE — Assessment & Plan Note (Signed)
Patient remains in sinus rhythm on examination. Continue Cardizem, propafenone and Coumadin.

## 2015-02-10 NOTE — Assessment & Plan Note (Signed)
We discussed importance of weight loss.  

## 2015-02-10 NOTE — Assessment & Plan Note (Signed)
Continue statin. 

## 2015-02-10 NOTE — Assessment & Plan Note (Signed)
Patient is euvolemic on examination. Continue present dose of diuretics. 

## 2015-03-07 ENCOUNTER — Ambulatory Visit (INDEPENDENT_AMBULATORY_CARE_PROVIDER_SITE_OTHER): Payer: BLUE CROSS/BLUE SHIELD | Admitting: General Practice

## 2015-03-07 DIAGNOSIS — I4892 Unspecified atrial flutter: Secondary | ICD-10-CM | POA: Diagnosis not present

## 2015-03-07 DIAGNOSIS — Z86718 Personal history of other venous thrombosis and embolism: Secondary | ICD-10-CM

## 2015-03-07 DIAGNOSIS — Z8672 Personal history of thrombophlebitis: Secondary | ICD-10-CM | POA: Diagnosis not present

## 2015-03-07 DIAGNOSIS — Z5181 Encounter for therapeutic drug level monitoring: Secondary | ICD-10-CM

## 2015-03-07 DIAGNOSIS — Z7901 Long term (current) use of anticoagulants: Secondary | ICD-10-CM

## 2015-03-07 DIAGNOSIS — I4891 Unspecified atrial fibrillation: Secondary | ICD-10-CM | POA: Diagnosis not present

## 2015-03-07 LAB — POCT INR: INR: 1.5

## 2015-03-07 NOTE — Progress Notes (Signed)
I have reviewed and agree with the plan. 

## 2015-03-07 NOTE — Progress Notes (Signed)
Pre visit review using our clinic review tool, if applicable. No additional management support is needed unless otherwise documented below in the visit note. INR is low today.  Encouraged patient to purchase and use pill box to help manage medication.  Pt did say that she has been sick this week and she may have missed a dose or two of medication.  Reiterated the risks of sub-therapeutic INR.  Patient verbalized understanding.

## 2015-03-28 ENCOUNTER — Ambulatory Visit (INDEPENDENT_AMBULATORY_CARE_PROVIDER_SITE_OTHER): Payer: BLUE CROSS/BLUE SHIELD | Admitting: General Practice

## 2015-03-28 DIAGNOSIS — I4892 Unspecified atrial flutter: Secondary | ICD-10-CM

## 2015-03-28 DIAGNOSIS — I4891 Unspecified atrial fibrillation: Secondary | ICD-10-CM | POA: Diagnosis not present

## 2015-03-28 DIAGNOSIS — Z8672 Personal history of thrombophlebitis: Secondary | ICD-10-CM

## 2015-03-28 DIAGNOSIS — Z5181 Encounter for therapeutic drug level monitoring: Secondary | ICD-10-CM

## 2015-03-28 DIAGNOSIS — Z7901 Long term (current) use of anticoagulants: Secondary | ICD-10-CM | POA: Diagnosis not present

## 2015-03-28 DIAGNOSIS — Z86718 Personal history of other venous thrombosis and embolism: Secondary | ICD-10-CM | POA: Diagnosis not present

## 2015-03-28 LAB — POCT INR: INR: 2.4

## 2015-03-28 NOTE — Progress Notes (Signed)
I have reviewed and agree with the plan. 

## 2015-03-28 NOTE — Progress Notes (Signed)
Pre visit review using our clinic review tool, if applicable. No additional management support is needed unless otherwise documented below in the visit note. 

## 2015-04-07 DIAGNOSIS — Z01419 Encounter for gynecological examination (general) (routine) without abnormal findings: Secondary | ICD-10-CM | POA: Diagnosis not present

## 2015-04-07 DIAGNOSIS — Z1231 Encounter for screening mammogram for malignant neoplasm of breast: Secondary | ICD-10-CM | POA: Diagnosis not present

## 2015-04-07 DIAGNOSIS — Z6841 Body Mass Index (BMI) 40.0 and over, adult: Secondary | ICD-10-CM | POA: Diagnosis not present

## 2015-04-07 DIAGNOSIS — N816 Rectocele: Secondary | ICD-10-CM | POA: Diagnosis not present

## 2015-04-09 ENCOUNTER — Other Ambulatory Visit: Payer: Self-pay | Admitting: Cardiology

## 2015-04-09 NOTE — Telephone Encounter (Signed)
Rx refill sent to pharmacy. 

## 2015-04-10 ENCOUNTER — Other Ambulatory Visit: Payer: Self-pay | Admitting: Obstetrics and Gynecology

## 2015-04-10 DIAGNOSIS — R928 Other abnormal and inconclusive findings on diagnostic imaging of breast: Secondary | ICD-10-CM

## 2015-04-11 ENCOUNTER — Encounter: Payer: Self-pay | Admitting: Internal Medicine

## 2015-04-11 ENCOUNTER — Ambulatory Visit (INDEPENDENT_AMBULATORY_CARE_PROVIDER_SITE_OTHER): Payer: BLUE CROSS/BLUE SHIELD | Admitting: Internal Medicine

## 2015-04-11 ENCOUNTER — Ambulatory Visit: Payer: BLUE CROSS/BLUE SHIELD | Admitting: Nurse Practitioner

## 2015-04-11 ENCOUNTER — Other Ambulatory Visit (INDEPENDENT_AMBULATORY_CARE_PROVIDER_SITE_OTHER): Payer: BLUE CROSS/BLUE SHIELD

## 2015-04-11 ENCOUNTER — Other Ambulatory Visit: Payer: Self-pay | Admitting: Internal Medicine

## 2015-04-11 VITALS — BP 136/86 | HR 89 | Temp 99.2°F | Resp 20 | Wt 276.0 lb

## 2015-04-11 DIAGNOSIS — Z0000 Encounter for general adult medical examination without abnormal findings: Secondary | ICD-10-CM

## 2015-04-11 DIAGNOSIS — J309 Allergic rhinitis, unspecified: Secondary | ICD-10-CM

## 2015-04-11 DIAGNOSIS — Z1159 Encounter for screening for other viral diseases: Secondary | ICD-10-CM

## 2015-04-11 DIAGNOSIS — R7302 Impaired glucose tolerance (oral): Secondary | ICD-10-CM

## 2015-04-11 DIAGNOSIS — I10 Essential (primary) hypertension: Secondary | ICD-10-CM

## 2015-04-11 DIAGNOSIS — R509 Fever, unspecified: Secondary | ICD-10-CM | POA: Diagnosis not present

## 2015-04-11 LAB — HEPATIC FUNCTION PANEL
ALT: 12 U/L (ref 0–35)
AST: 12 U/L (ref 0–37)
Albumin: 4.1 g/dL (ref 3.5–5.2)
Alkaline Phosphatase: 104 U/L (ref 39–117)
Bilirubin, Direct: 0.2 mg/dL (ref 0.0–0.3)
Total Bilirubin: 0.9 mg/dL (ref 0.2–1.2)
Total Protein: 7.2 g/dL (ref 6.0–8.3)

## 2015-04-11 LAB — LIPID PANEL
Cholesterol: 148 mg/dL (ref 0–200)
HDL: 41.2 mg/dL (ref >39.00)
LDL Cholesterol: 84 mg/dL (ref 0–99)
NonHDL: 106.95
Total CHOL/HDL Ratio: 4
Triglycerides: 117 mg/dL (ref 0.0–149.0)
VLDL: 23.4 mg/dL (ref 0.0–40.0)

## 2015-04-11 LAB — URINALYSIS, ROUTINE W REFLEX MICROSCOPIC
Bilirubin Urine: NEGATIVE
Hgb urine dipstick: NEGATIVE
Ketones, ur: NEGATIVE
Nitrite: NEGATIVE
Specific Gravity, Urine: <=1.005 — AB (ref 1.000–1.030)
Total Protein, Urine: 30 — AB
Urine Glucose: NEGATIVE
Urobilinogen, UA: 2 — AB (ref 0.0–1.0)
pH: 8 (ref 5.0–8.0)

## 2015-04-11 LAB — CBC WITH DIFFERENTIAL/PLATELET
Basophils Absolute: 0 10*3/uL (ref 0.0–0.1)
Basophils Relative: 0.4 % (ref 0.0–3.0)
Eosinophils Absolute: 0.1 10*3/uL (ref 0.0–0.7)
Eosinophils Relative: 1.6 % (ref 0.0–5.0)
HCT: 33.5 % — ABNORMAL LOW (ref 36.0–46.0)
Hemoglobin: 11.2 g/dL — ABNORMAL LOW (ref 12.0–15.0)
Lymphocytes Relative: 29.4 % (ref 12.0–46.0)
Lymphs Abs: 2 10*3/uL (ref 0.7–4.0)
MCHC: 33.5 g/dL (ref 30.0–36.0)
MCV: 83.5 fl (ref 78.0–100.0)
Monocytes Absolute: 0.5 10*3/uL (ref 0.1–1.0)
Monocytes Relative: 7.5 % (ref 3.0–12.0)
Neutro Abs: 4.1 10*3/uL (ref 1.4–7.7)
Neutrophils Relative %: 61.1 % (ref 43.0–77.0)
Platelets: 265 10*3/uL (ref 150.0–400.0)
RBC: 4.01 Mil/uL (ref 3.87–5.11)
RDW: 18.4 % — ABNORMAL HIGH (ref 11.5–15.5)
WBC: 6.7 10*3/uL (ref 4.0–10.5)

## 2015-04-11 LAB — BASIC METABOLIC PANEL
BUN: 11 mg/dL (ref 6–23)
CO2: 37 mEq/L — ABNORMAL HIGH (ref 19–32)
Calcium: 9.3 mg/dL (ref 8.4–10.5)
Chloride: 102 mEq/L (ref 96–112)
Creatinine, Ser: 0.68 mg/dL (ref 0.40–1.20)
GFR: 92.34 mL/min (ref >60.00)
Glucose, Bld: 133 mg/dL — ABNORMAL HIGH (ref 70–99)
Potassium: 4 mEq/L (ref 3.5–5.1)
Sodium: 145 mEq/L (ref 135–145)

## 2015-04-11 LAB — HEPATITIS C ANTIBODY: HCV Ab: NEGATIVE

## 2015-04-11 LAB — TSH: TSH: 5.05 u[IU]/mL — ABNORMAL HIGH (ref 0.35–4.50)

## 2015-04-11 LAB — HEMOGLOBIN A1C: Hgb A1c MFr Bld: 5.4 % (ref 4.6–6.5)

## 2015-04-11 MED ORDER — PREDNISONE 10 MG PO TABS: ORAL_TABLET | ORAL | Status: DC

## 2015-04-11 MED ORDER — LEVOTHYROXINE SODIUM 25 MCG PO TABS: 25.0000 ug | ORAL_TABLET | Freq: Every day | ORAL | Status: DC

## 2015-04-11 NOTE — Assessment & Plan Note (Signed)
stable overall by history and exam, recent data reviewed with pt, and pt to continue medical treatment as before,  to f/u any worsening symptoms or concerns BP Readings from Last 3 Encounters:  04/11/15 136/86  02/10/15 160/60  12/11/14 130/67

## 2015-04-11 NOTE — Assessment & Plan Note (Signed)
stable overall by history and exam, recent data reviewed with pt, and pt to continue medical treatment as before,  to f/u any worsening symptoms or concerns Lab Results  Component Value Date   HGBA1C 5.2 01/31/2014   For fu, and to call for onset polys with steroid tx

## 2015-04-11 NOTE — Progress Notes (Signed)
Pre visit review using our clinic review tool, if applicable. No additional management support is needed unless otherwise documented below in the visit note. 

## 2015-04-11 NOTE — Assessment & Plan Note (Signed)

## 2015-04-11 NOTE — Assessment & Plan Note (Addendum)
With seasonal flare mild to mod, for depomedrol IM, predpac asd,  to f/u any worsening symptoms or concerns, cont all other meds including symbicort,nasacort  .In addition to the time spent performing CPE, I spent an additional 40 minutes face to face,in which greater than 50% of this time was spent in counseling and coordination of care for patient's acute illness as documented.

## 2015-04-11 NOTE — Patient Instructions (Signed)
You had the steroid shot today -   Please take all new medication as prescribed - the prednisone (sent to [facility])  Please continue all other medications as before, and refills have been done if requested.  Please have the pharmacy call with any other refills you may need.  Please continue your efforts at being more active, low cholesterol diet, and weight control.  You are otherwise up to date with prevention measures today.  Please keep your appointments with your specialists as you may have planned  Please go to the LAB in the Basement (turn left off the elevator) for the tests to be done today  You will be contacted by phone if any changes need to be made immediately.  Otherwise, you will receive a letter about your results with an explanation, but please check with MyChart first.  Please remember to sign up for MyChart if you have not done so, as this will be important to you in the future with finding out test results, communicating by private email, and scheduling acute appointments online when needed.  Please return in 6 months, or sooner if needed

## 2015-04-11 NOTE — Assessment & Plan Note (Signed)
Etiology unclear, unclear clinical signficance, no overt s/s infection today, for UA with labs,  to f/u any worsening symptoms or concerns

## 2015-04-11 NOTE — Progress Notes (Signed)
Subjective:    Patient ID: [name] [name], female    DOB: 1950-12-11, 65 y.o.   MRN: [id number]  HPI  Here for wellness and f/u;  Overall doing ok;  Pt denies Chest pain, worsening SOB, DOE, wheezing, orthopnea, PND, worsening LE edema, palpitations, dizziness or syncope.  Pt denies neurological change such as new headache, facial or extremity weakness.  Pt denies polydipsia, polyuria, or low sugar symptoms. Pt states overall good compliance with treatment and medications, good tolerability, and has been trying to follow appropriate diet.  Pt denies worsening depressive symptoms, suicidal ideation or panic. No fever, night sweats, wt loss, loss of appetite, or other constitutional symptoms.  Pt states good ability with ADL's, has low fall risk, home safety reviewed and adequate, no other significant changes in hearing or vision, and only occasionally active with exercise. Also, however: "I've been sick for 7 wks."  Not better with mucinex, zyrtec and eye drops and nasal streroid as she was treating allergies mostly nasal and eyes, no chest congestion but always trying to clear her throat she believes from post nasal gtt. Has been trying to avoid sick family, but still today also with feverish, low grade temp on arrival.  Did have Pap and mammogram per GYN recent, asked to return for further films due to ? Abnormal initial films. Declines colonoscopy at least partially she is actually actively paying off the $10000 bill from last in 2006 when there was some mistake about insurance coverage at the time that she did not rectify before 180 days, but wants the hep c screen.  Last seen per pulm nov 2016, due for f/u soon  Denies urinary symptoms such as dysuria, frequency, urgency, flank pain, hematuria or n/v, fever, chills. Past Medical History  Diagnosis Date  . Chronic rhinitis   . Morbid obesity ([place])     target wt=179lb for BMI<30 Peak wt 282lb  . History of DVT (deep vein thrombosis)   . COPD (chronic  obstructive pulmonary disease) ([place])     [place]. PFTs 07/25/06 FEV1 55% ratio 53, DLC0 51% HFA 50% 12/16/2009  . Depression   . Osteoporosis     last BMD 4/08 -2.4, intolerant of bisphosphonates  . Hypertension   . Atrial fibrillation and flutter ([place])   . Anxiety   . Glucose intolerance (impaired glucose tolerance)     on steroids  . VITAMIN D DEFICIENCY   . HYPERLIPIDEMIA   . PREMATURE VENTRICULAR CONTRACTIONS   . Rhabdomyolysis 07/29/2009  . FRACTURE, RIB, RIGHT 06/18/2009  . PULMONARY EMBOLISM, HX OF 06/19/2007  . Tremor   . Hx of cardiac catheterization     LHC (01/2001):  Normal Cors.  EF 65%.;  LexiScan Myoview (03/2013): No ischemia, EF 61%, normal  . Hx of echocardiogram     Echo (11/2011):  Mod LVH, EF 60-65%, no RWMA, MAC, mild LAE, PASP 34.  . Asthma   . CHF (congestive heart failure) [facility])    Past Surgical History  Procedure Laterality Date  . Abdominal hysterectomy    . Tonsillectomy    . Skin graft to middle r finger  1975  . S/p left arm fracture with fall off chair    . Colonoscopy  10/22/2003, ?2006  . Cardioversion N/A 12/11/2014    Procedure: CARDIOVERSION;  Surgeon: Josue Hector, MD;  Location: [facility] ENDOSCOPY;  Service: Cardiovascular;  Laterality: N/A;  . Tee without cardioversion N/A 12/11/2014    Procedure: TRANSESOPHAGEAL ECHOCARDIOGRAM (TEE);  Surgeon: Josue Hector,  MD;  Location: [facility] ENDOSCOPY;  Service: Cardiovascular;  Laterality: N/A;    reports that she quit smoking about 19 years ago. Her smoking use included Cigarettes. She has a 35 pack-year smoking history. She has never used smokeless tobacco. She reports that she does not drink alcohol or use illicit drugs. family history includes Asthma in her brother and son; Heart disease in her mother; Throat cancer in her brother. Allergies  Allergen Reactions  . Duloxetine Other (See Comments)    REACTION: rhabdomyolysis  . Penicillins Other (See Comments)    SYNCOPE  . Latex Rash   Current Outpatient  Prescriptions on File Prior to Visit  Medication Sig Dispense Refill  . Aclidinium Bromide (TUDORZA PRESSAIR) 400 MCG/ACT AEPB Inhale 1 puff into the lungs 2 (two) times daily. 3 each 1  . albuterol (PROVENTIL HFA;VENTOLIN HFA) 108 (90 Base) MCG/ACT inhaler Inhale 2 puffs into the lungs every 4 (four) hours as needed for wheezing or shortness of breath.    Marland Kitchen atorvastatin (LIPITOR) 10 MG tablet Take 1 tablet (10 mg total) by mouth daily. 90 tablet 2  . budesonide-formoterol (SYMBICORT) 160-4.5 MCG/ACT inhaler Inhale 2 puffs into the lungs 2 (two) times daily.    . Calcium Carbonate-Vitamin D 600-400 MG-UNIT per tablet Take 1 tablet by mouth 2 (two) times daily.      . cetirizine (ZYRTEC) 10 MG tablet Take 10 mg by mouth at bedtime as needed for allergies.     . cholecalciferol (VITAMIN D) 1000 UNITS tablet Take 1,000 Units by mouth every morning.    Marland Kitchen Dextromethorphan-Guaifenesin (MUCINEX DM MAXIMUM STRENGTH) 60-1200 MG TB12 Take 1 tablet by mouth every 12 (twelve) hours as needed (cough/congestion).    Marland Kitchen diltiazem (CARDIZEM CD) 120 MG 24 hr capsule Take 1 capsule (120 mg total) by mouth daily. 90 capsule 3  . diltiazem (CARDIZEM CD) 240 MG 24 hr capsule Take 1 capsule (240 mg total) by mouth daily. 15 capsule 0  . FLUoxetine (PROZAC) 40 MG capsule Take 40 mg by mouth daily.    . furosemide (LASIX) 80 MG tablet Take one and half (1.5) tablet (120 mg total) by mouth every morning and take one (1) tablet (80 mg total) by mouth in the afternoon.    Marland Kitchen glucosamine-chondroitin 500-400 MG tablet Take 1 tablet by mouth every morning.     Marland Kitchen HYDROMET 5-1.5 MG/5ML syrup Take 5 mLs by mouth daily as needed for cough.   0  . KLOR-CON M20 20 MEQ tablet Take 1 tablet (20 mEq total) by mouth 2 (two) times daily. 180 tablet 1  . levalbuterol (XOPENEX) 1.25 MG/3ML nebulizer solution Take 1.25 mg by nebulization every 4 (four) hours as needed for wheezing or shortness of breath.    . mometasone (NASONEX) 50 MCG/ACT  nasal spray Place 2 sprays into the nose 2 (two) times daily as needed (For nasal congestion.).     Marland Kitchen omeprazole (PRILOSEC) 20 MG capsule Take 40 mg by mouth daily.    . propafenone (RYTHMOL SR) 325 MG 12 hr capsule TAKE 1 CAPSULE TWICE A DAY 180 capsule 1  . sodium chloride (OCEAN) 0.65 % SOLN nasal spray Place 2 sprays into both nostrils 2 (two) times daily as needed for congestion.     Marland Kitchen warfarin (COUMADIN) 7.5 MG tablet TAKE AS DIRECTED BY COUMADIN CLINIC 120 tablet 1   No current facility-administered medications on file prior to visit.   Review of Systems Constitutional: Negative for increased diaphoresis, or other activity, appetite or  siginficant weight change other than noted HENT: Negative for worsening hearing loss, ear pain, facial swelling, mouth sores and neck stiffness.   Eyes: Negative for other worsening pain, redness or visual disturbance.  Respiratory: Negative for choking or stridor Cardiovascular: Negative for other chest pain and palpitations.  Gastrointestinal: Negative for worsening diarrhea, blood in stool, or abdominal distention Genitourinary: Negative for hematuria, flank pain or change in urine volume.  Musculoskeletal: Negative for myalgias or other joint complaints.  Skin: Negative for other color change and wound or drainage.  Neurological: Negative for syncope and numbness. other than noted Hematological: Negative for adenopathy. or other swelling Psychiatric/Behavioral: Negative for hallucinations, SI, self-injury, decreased concentration or other worsening agitation.      Objective:   Physical Exam BP 136/86 mmHg  Pulse 89  Temp(Src) 99.2 F (37.3 C) (Oral)  Resp 20  Wt 276 lb (125.193 kg)  SpO2 94% VS noted, obese, nontoxic Constitutional: Pt is oriented to person, place, and time. Appears well-developed and well-nourished, in no significant distress Head: Normocephalic and atraumatic  Eyes: Conjunctivae and EOM are normal. Pupils are equal,  round, and reactive to light Right Ear: External ear normal.  Left Ear: External ear normal Nose: Nose normal.  Mouth/Throat: Oropharynx is clear and moist  Bilat tm's with mild erythema.  Max sinus areas non tender.  Pharynx with mild erythema, no exudate Neck: Normal range of motion. Neck supple. No JVD present. No tracheal deviation present or significant neck LA or mass Cardiovascular: Normal rate, regular rhythm, normal heart sounds and intact distal pulses.   Pulmonary/Chest: Effort normal and breath sounds decreased without rales or other significant wheezing  Abdominal: Soft. Bowel sounds are normal. NT. No HSM  Musculoskeletal: Normal range of motion. Exhibits no edema Lymphadenopathy: Has no cervical adenopathy.  Neurological: Pt is alert and oriented to person, place, and time. Pt has normal reflexes. No cranial nerve deficit. Motor grossly intact Skin: Skin is warm and dry. No rash noted or new ulcers Psychiatric:  Has normal mood and affect. Behavior is normal.   Most recent labs Lab Results  Component Value Date   WBC 12.2* 12/04/2014   HGB 13.0 12/04/2014   HCT 37.3 12/04/2014   PLT 325 12/04/2014   GLUCOSE 125* 12/04/2014   CHOL 140 01/31/2014   TRIG 146.0 01/31/2014   HDL 37.80* 01/31/2014   LDLDIRECT 140.2 04/10/2010   LDLCALC 73 01/31/2014   ALT 15 04/08/2014   AST 16 04/08/2014   NA 143 12/04/2014   K 4.1 12/04/2014   CL 100 12/04/2014   CREATININE 0.83 12/04/2014   BUN 12 12/04/2014   CO2 29 12/04/2014   TSH 4.64* 01/31/2014   INR 2.4 03/28/2015   HGBA1C 5.2 01/31/2014   CLINICAL DATA: COPD/emphysema. Hypertension.  EXAM: CHEST 2 VIEW  COMPARISON: 04/08/2014  FINDINGS: Mild enlargement of the cardiopericardial silhouette with slight cephalization of blood flow which may reflect pulmonary venous hypertension. No overt edema.  Mild scarring or atelectasis peripherally at the left lung base.  Thoracic spondylosis.  IMPRESSION: 1.  Mild enlargement of the cardiopericardial silhouette, with suspected pulmonary venous hypertension but no overt edema. 2. Scarring or subsegmental atelectasis at the left lung base.   Electronically Signed  By: Van Clines M.D.  On: 11/26/2014 17:18    Assessment & Plan:

## 2015-04-15 ENCOUNTER — Telehealth: Payer: Self-pay | Admitting: Geriatric Medicine

## 2015-04-15 NOTE — Telephone Encounter (Signed)
Patient is requesting a hand rail to grab onto when she is getting out of her bed. She is asking for a hand written prescription to take to [facility].

## 2015-04-15 NOTE — Telephone Encounter (Signed)
[place] for this patient- Done hardcopy to [employer]

## 2015-04-16 NOTE — Telephone Encounter (Signed)
Patient aware and asked me to mail it to her.

## 2015-04-21 ENCOUNTER — Ambulatory Visit
Admission: RE | Admit: 2015-04-21 | Discharge: 2015-04-21 | Disposition: A | Discharge status: Home / Self Care | Payer: BLUE CROSS/BLUE SHIELD | Source: Ambulatory Visit | Attending: Obstetrics and Gynecology | Admitting: Obstetrics and Gynecology

## 2015-04-21 ENCOUNTER — Other Ambulatory Visit: Payer: Self-pay | Admitting: Obstetrics and Gynecology

## 2015-04-21 DIAGNOSIS — R928 Other abnormal and inconclusive findings on diagnostic imaging of breast: Secondary | ICD-10-CM

## 2015-04-22 ENCOUNTER — Telehealth: Payer: Self-pay | Admitting: General Practice

## 2015-04-22 ENCOUNTER — Other Ambulatory Visit: Payer: Self-pay | Admitting: General Practice

## 2015-04-22 MED ORDER — ENOXAPARIN SODIUM 120 MG/0.8ML ~~LOC~~ SOLN: 120.0000 mg | Freq: Two times a day (BID) | SUBCUTANEOUS | Status: DC

## 2015-04-22 NOTE — Telephone Encounter (Signed)
-----   Message from Biagio Borg, MD sent at 04/21/2015  6:17 PM EDT ----- Regarding: RE: Clearance to stop coumadin Ok to stop, and I would do the lovenox bridge, thanks ----- Message -----    From: Warden Fillers, RN    Sent: 04/21/2015  12:59 PM      To: Biagio Borg, MD Subject: Clearance to stop coumadin                     Dr. Jenny Reichmann,  Please see the message below.  OK to stop coumadin for 5 days?  Lovenox?  Thanks, Jenny Reichmann ----- Message -----    From: Bary Leriche, RN    Sent: 04/21/2015  11:34 AM      To: Warden Fillers, RN  Hello,  Your patient, [name] needs a breast biopsy and is on Coumadin. We have scheduled her for 04-28-15.  Please advise as to how to best manage her Coumadin for this biopsy.  Thank you, Terie Purser RN, RNFA Nurse Navigator [facility] [phone]

## 2015-04-22 NOTE — Telephone Encounter (Signed)
Instructions for Lovenox and coumadin pre and post procedure. 4/12 - Last dose of coumadin 4/13 - Nothing 4/14 - Lovenox in AM and PM 4/15 - Lovenox in AM and PM 4/16 - Lovenox in the AM only 4/17 - Procedure (Nothing) 4/18 - Lovenox in the AM and PM and 2 tablets of coumadin 4/19 - Lovenox in the AM and PM and 2 tablets of coumadin 4/20 - Lovenox X 2 and 1 1/2 tablets of coumadin 4/21 - Keep appointment in clinic.  Patient verbalizes understanding.  Lovenox called into [facility] today.

## 2015-04-28 ENCOUNTER — Other Ambulatory Visit: Payer: Self-pay | Admitting: Obstetrics and Gynecology

## 2015-04-28 ENCOUNTER — Ambulatory Visit
Admission: RE | Admit: 2015-04-28 | Discharge: 2015-04-28 | Disposition: A | Discharge status: Home / Self Care | Payer: BLUE CROSS/BLUE SHIELD | Source: Ambulatory Visit | Attending: Obstetrics and Gynecology | Admitting: Obstetrics and Gynecology

## 2015-04-28 DIAGNOSIS — R921 Mammographic calcification found on diagnostic imaging of breast: Secondary | ICD-10-CM | POA: Diagnosis not present

## 2015-04-28 DIAGNOSIS — R928 Other abnormal and inconclusive findings on diagnostic imaging of breast: Secondary | ICD-10-CM

## 2015-05-02 ENCOUNTER — Other Ambulatory Visit: Payer: Self-pay | Admitting: *Deleted

## 2015-05-02 ENCOUNTER — Ambulatory Visit (INDEPENDENT_AMBULATORY_CARE_PROVIDER_SITE_OTHER): Payer: BLUE CROSS/BLUE SHIELD | Admitting: General Practice

## 2015-05-02 DIAGNOSIS — Z5181 Encounter for therapeutic drug level monitoring: Secondary | ICD-10-CM

## 2015-05-02 DIAGNOSIS — I4892 Unspecified atrial flutter: Secondary | ICD-10-CM

## 2015-05-02 DIAGNOSIS — Z86718 Personal history of other venous thrombosis and embolism: Secondary | ICD-10-CM | POA: Diagnosis not present

## 2015-05-02 DIAGNOSIS — Z7901 Long term (current) use of anticoagulants: Secondary | ICD-10-CM | POA: Diagnosis not present

## 2015-05-02 DIAGNOSIS — I4891 Unspecified atrial fibrillation: Secondary | ICD-10-CM

## 2015-05-02 DIAGNOSIS — Z8672 Personal history of thrombophlebitis: Secondary | ICD-10-CM

## 2015-05-02 LAB — POCT INR: INR: 1.8

## 2015-05-02 MED ORDER — KLOR-CON M20 20 MEQ PO TBCR: 20.0000 meq | EXTENDED_RELEASE_TABLET | Freq: Two times a day (BID) | ORAL | Status: DC

## 2015-05-02 NOTE — Progress Notes (Signed)
Pre visit review using our clinic review tool, if applicable. No additional management support is needed unless otherwise documented below in the visit note. 

## 2015-05-02 NOTE — Telephone Encounter (Signed)
REFILL 

## 2015-05-02 NOTE — Progress Notes (Signed)
I have reviewed and agree with the plan. 

## 2015-05-07 ENCOUNTER — Telehealth: Payer: Self-pay

## 2015-05-07 NOTE — Telephone Encounter (Signed)
Patient is aware 

## 2015-05-07 NOTE — Telephone Encounter (Signed)
Ok to try benadryl cream for itching, but especially if worsening pain, fever or drainage she should make ROV

## 2015-05-07 NOTE — Telephone Encounter (Signed)
Patient states she needs the nurse to call her to talk about some stuff.. Her biggest thing is she has a rash and the itching is driving her crazy.. She believes it is all coming for the hemp shots. She had to have one because she came off her coumadin.. She is now back on her coumadin. Please follow up. Thank you.

## 2015-05-07 NOTE — Telephone Encounter (Signed)
Called and spoke to patient, she states that she had a biopsy done on her right breast on the 10th of April, she was taken off of her coumadin and placed on heprin shots due to the biopsy. She is allergic to the shots. She has a rash and is itching as well as blistering and red. She has used two tubes of cortisone cream and nothing has helped. Please advise on what she can do.

## 2015-05-09 ENCOUNTER — Encounter: Payer: Self-pay | Admitting: General Surgery

## 2015-05-09 ENCOUNTER — Other Ambulatory Visit: Payer: Self-pay | Admitting: General Surgery

## 2015-05-09 DIAGNOSIS — Z8659 Personal history of other mental and behavioral disorders: Secondary | ICD-10-CM | POA: Diagnosis not present

## 2015-05-09 DIAGNOSIS — N6091 Unspecified benign mammary dysplasia of right breast: Secondary | ICD-10-CM

## 2015-05-09 DIAGNOSIS — Z5181 Encounter for therapeutic drug level monitoring: Secondary | ICD-10-CM | POA: Diagnosis not present

## 2015-05-09 DIAGNOSIS — Z7901 Long term (current) use of anticoagulants: Secondary | ICD-10-CM | POA: Diagnosis not present

## 2015-05-09 DIAGNOSIS — Z9981 Dependence on supplemental oxygen: Secondary | ICD-10-CM | POA: Diagnosis not present

## 2015-05-09 DIAGNOSIS — I4892 Unspecified atrial flutter: Secondary | ICD-10-CM | POA: Diagnosis not present

## 2015-05-09 DIAGNOSIS — J45909 Unspecified asthma, uncomplicated: Secondary | ICD-10-CM | POA: Diagnosis not present

## 2015-05-09 DIAGNOSIS — I4891 Unspecified atrial fibrillation: Secondary | ICD-10-CM | POA: Diagnosis not present

## 2015-05-09 DIAGNOSIS — I110 Hypertensive heart disease with heart failure: Secondary | ICD-10-CM | POA: Diagnosis not present

## 2015-05-09 DIAGNOSIS — Z86711 Personal history of pulmonary embolism: Secondary | ICD-10-CM | POA: Diagnosis not present

## 2015-05-09 DIAGNOSIS — J449 Chronic obstructive pulmonary disease, unspecified: Secondary | ICD-10-CM | POA: Diagnosis not present

## 2015-05-09 HISTORY — DX: Unspecified benign mammary dysplasia of right breast: N60.91

## 2015-05-12 ENCOUNTER — Other Ambulatory Visit: Payer: Self-pay | Admitting: Internal Medicine

## 2015-05-12 ENCOUNTER — Telehealth: Payer: Self-pay | Admitting: Cardiology

## 2015-05-12 ENCOUNTER — Encounter: Payer: Self-pay | Admitting: Internal Medicine

## 2015-05-12 MED ORDER — LEVOTHYROXINE SODIUM 25 MCG PO TABS: 25.0000 ug | ORAL_TABLET | Freq: Every day | ORAL | Status: DC

## 2015-05-12 MED ORDER — DILTIAZEM HCL ER COATED BEADS 360 MG PO CP24: 360.0000 mg | ORAL_CAPSULE | Freq: Every day | ORAL | Status: DC

## 2015-05-12 NOTE — Telephone Encounter (Signed)
NEW MESSAGE   Pt is calling to speak to rn she needs to know what medications should she take  Diltiazem HCL 120mg    Diltiazem HCL  240mg   Pt states both was prescribed by Dr.Crenshaw

## 2015-05-12 NOTE — Telephone Encounter (Signed)
Clarified w/ patient, she requests the 360mg  dose, pt needs 1 pill. Informed her I would sent to pharmacy. Aware to call for any further needs.

## 2015-05-13 ENCOUNTER — Telehealth: Payer: Self-pay | Admitting: General Practice

## 2015-05-13 ENCOUNTER — Other Ambulatory Visit: Payer: Self-pay | Admitting: *Deleted

## 2015-05-13 MED ORDER — DILTIAZEM HCL ER COATED BEADS 360 MG PO CP24: 360.0000 mg | ORAL_CAPSULE | Freq: Every day | ORAL | Status: DC

## 2015-05-13 NOTE — Telephone Encounter (Signed)
-----   Message from Biagio Borg, MD sent at 05/13/2015  8:33 AM EDT ----- Regarding: RE: holding coumadin with lovenox bridge Ok , xarelto would be great. thanks ----- Message -----    From: Warden Fillers, RN    Sent: 05/13/2015   8:01 AM      To: Biagio Borg, MD Subject: RE: holding coumadin with lovenox bridge       Actually Eliquis needs to be held 24 to 48 hours prior and Xarelto only 24 hours. ----- Message -----    From: Biagio Borg, MD    Sent: 05/12/2015   9:40 PM      To: Warden Fillers, RN Subject: RE: holding coumadin with lovenox bridge       Would eliquis be better since it can be stopped the day prior? thanks ----- Message -----    From: Warden Fillers, RN    Sent: 05/12/2015   8:29 AM      To: Biagio Borg, MD Subject: RE: holding coumadin with lovenox bridge       Dr. Jenny Reichmann,  Can we bridge with Xarelto?  Patient had a really tough time with the Lovenox.  I believe there might be some samples there at [facility].  What do you think? ----- Message -----    From: Biagio Borg, MD    Sent: 05/09/2015   5:19 PM      To: Warden Fillers, RN Subject: holding coumadin with lovenox bridge           Jenny Reichmann, can we coordinate with [facility] to hold coumadin with lovenox bridge 5 days prior to right lumpectomy (date to be determined).    thanks

## 2015-05-15 ENCOUNTER — Telehealth: Payer: Self-pay | Admitting: Cardiology

## 2015-05-15 NOTE — Telephone Encounter (Signed)
Follow-up      The patient is calling back ;pt saw missed call from the heart doctor she is uncertain about what the office called about on 05/12/15

## 2015-05-15 NOTE — Telephone Encounter (Signed)
Returned call. Pt realized she spoke to me on 5/1. Clarified that her correct diltiazem dose was sent to [facility]. No further questions or concerns.

## 2015-05-16 ENCOUNTER — Telehealth: Payer: Self-pay | Admitting: *Deleted

## 2015-05-16 NOTE — Telephone Encounter (Signed)
Pa eval preop Kirk Ruths

## 2015-05-16 NOTE — Telephone Encounter (Signed)
Spoke with pt, Follow up scheduled  

## 2015-05-16 NOTE — Telephone Encounter (Signed)
Pt needs clearance for right breast lumpectomy under general anesthesia. Will forward for dr Stanford Breed review

## 2015-05-20 ENCOUNTER — Encounter: Payer: Self-pay | Admitting: Cardiology

## 2015-05-20 ENCOUNTER — Ambulatory Visit (INDEPENDENT_AMBULATORY_CARE_PROVIDER_SITE_OTHER): Payer: BLUE CROSS/BLUE SHIELD | Admitting: Cardiology

## 2015-05-20 VITALS — BP 144/76 | HR 66 | Ht 64.0 in | Wt 276.0 lb

## 2015-05-20 DIAGNOSIS — I480 Paroxysmal atrial fibrillation: Secondary | ICD-10-CM

## 2015-05-20 DIAGNOSIS — I483 Typical atrial flutter: Secondary | ICD-10-CM

## 2015-05-20 DIAGNOSIS — I10 Essential (primary) hypertension: Secondary | ICD-10-CM

## 2015-05-20 DIAGNOSIS — J438 Other emphysema: Secondary | ICD-10-CM | POA: Diagnosis not present

## 2015-05-20 DIAGNOSIS — Z86718 Personal history of other venous thrombosis and embolism: Secondary | ICD-10-CM

## 2015-05-20 DIAGNOSIS — Z01810 Encounter for preprocedural cardiovascular examination: Secondary | ICD-10-CM | POA: Diagnosis not present

## 2015-05-20 DIAGNOSIS — I4891 Unspecified atrial fibrillation: Secondary | ICD-10-CM

## 2015-05-20 DIAGNOSIS — I5032 Chronic diastolic (congestive) heart failure: Secondary | ICD-10-CM | POA: Diagnosis not present

## 2015-05-20 NOTE — Assessment & Plan Note (Signed)
She has a history of remote PE/DVT. She has been on Coumadin for >38yrs. VQ in 2012 did not suggest PE.

## 2015-05-20 NOTE — Assessment & Plan Note (Signed)
Controlled.  

## 2015-05-20 NOTE — Assessment & Plan Note (Signed)
-   PFT's 04/19/2012 FEV1 50% (ratio 51) with 19% improvement p B2, dlco 42 > 99% corrected  On chronic O2, Dr Melvyn Novas follows

## 2015-05-20 NOTE — Progress Notes (Signed)
[id number] [name] [name]   [id number]  [id number]  Primary Physician Cathlean Cower, MD Primary Cardiologist: Dr Stanford Breed  HPI:  Pleasant 65 y/o female followed by Dr Stanford Breed with a history of PAF, COPD on home O2, morbid obesity, HTN, and past history of unprovoked PE and DVT- on long term Coumadin. She had normal coronaries in 2003 and a low risk Myoview in 2015 (with normal LVF). She is here today for pre op cardiac clearance prior to proposed breast lumpectomy under general anesthesia. She denies nay cardiac issues. She is on chronic O2 for her her COPD. She has previously had an allergic reaction with "hives" to Heparin and said she did not tolerate Lovenox well with pain and bruising. I discussed Coumadin crossover with Dr Stanford Breed and he feels she can stop her Coumadin 5 days pre op and resume post op ASAP (no bridging).    Current Outpatient Prescriptions  Medication Sig Dispense Refill  . Aclidinium Bromide (TUDORZA PRESSAIR) 400 MCG/ACT AEPB Inhale 1 puff into the lungs 2 (two) times daily. 3 each 1  . albuterol (PROVENTIL HFA;VENTOLIN HFA) 108 (90 Base) MCG/ACT inhaler Inhale 2 puffs into the lungs every 4 (four) hours as needed for wheezing or shortness of breath.    Marland Kitchen atorvastatin (LIPITOR) 10 MG tablet TAKE 1 TABLET DAILY 90 tablet 1  . budesonide-formoterol (SYMBICORT) 160-4.5 MCG/ACT inhaler Inhale 2 puffs into the lungs 2 (two) times daily.    . Calcium Carbonate-Vitamin D 600-400 MG-UNIT per tablet Take 1 tablet by mouth 2 (two) times daily.      . cetirizine (ZYRTEC) 10 MG tablet Take 10 mg by mouth at bedtime as needed for allergies.     . cholecalciferol (VITAMIN D) 1000 UNITS tablet Take 1,000 Units by mouth every morning.    Marland Kitchen Dextromethorphan-Guaifenesin (MUCINEX DM MAXIMUM STRENGTH) 60-1200 MG TB12 Take 1 tablet by mouth every 12 (twelve) hours as needed (cough/congestion).    Marland Kitchen diltiazem (CARDIZEM CD) 360 MG 24 hr capsule Take 1 capsule (360 mg total) by mouth daily. 90  capsule 1  . FLUoxetine (PROZAC) 40 MG capsule Take 40 mg by mouth daily.    . furosemide (LASIX) 80 MG tablet Take one and half (1.5) tablet (120 mg total) by mouth every morning and take one (1) tablet (80 mg total) by mouth in the afternoon.    Marland Kitchen glucosamine-chondroitin 500-400 MG tablet Take 1 tablet by mouth every morning.     Marland Kitchen KLOR-CON M20 20 MEQ tablet Take 1 tablet (20 mEq total) by mouth 2 (two) times daily. 180 tablet 0  . levalbuterol (XOPENEX) 1.25 MG/3ML nebulizer solution Take 1.25 mg by nebulization every 4 (four) hours as needed for wheezing or shortness of breath.    . levothyroxine (SYNTHROID, LEVOTHROID) 25 MCG tablet Take 1 tablet (25 mcg total) by mouth daily before breakfast. 90 tablet 2  . mometasone (NASONEX) 50 MCG/ACT nasal spray Place 2 sprays into the nose 2 (two) times daily as needed (For nasal congestion.).     Marland Kitchen omeprazole (PRILOSEC) 20 MG capsule Take 40 mg by mouth daily.    . propafenone (RYTHMOL SR) 325 MG 12 hr capsule TAKE 1 CAPSULE TWICE A DAY 180 capsule 1  . sodium chloride (OCEAN) 0.65 % SOLN nasal spray Place 2 sprays into both nostrils 2 (two) times daily as needed for congestion.     Marland Kitchen warfarin (COUMADIN) 7.5 MG tablet TAKE AS DIRECTED BY COUMADIN CLINIC 120 tablet 1   No current  facility-administered medications for this visit.    Allergies  Allergen Reactions  . Duloxetine Other (See Comments)    REACTION: rhabdomyolysis  . Penicillins Other (See Comments)    SYNCOPE  . Latex Rash    Social History   Social History  . Marital Status: Married    Spouse Name: N/A  . Number of Children: 3  . Years of Education: N/A   Occupational History  . 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 Smoking status: Former Smoker -- 1.00 packs/day for 35 years    Types: Cigarettes    Quit date: 01/12/1996  . Smokeless tobacco: Never Used  . Alcohol Use: No  . Drug Use: No  . Sexual Activity: Not on file   Other  Topics Concern  . Not on file   Social History Narrative     Review of Systems: General: negative for chills, fever, night sweats or weight changes.  Cardiovascular: negative for chest pain, dyspnea on exertion, edema, orthopnea, palpitations, paroxysmal nocturnal dyspnea or shortness of breath Dermatological: negative for rash Respiratory: negative for cough or wheezing Urologic: negative for hematuria Abdominal: negative for nausea, vomiting, diarrhea, bright red blood per rectum, melena, or hematemesis Neurologic: negative for visual changes, syncope, or dizziness All other systems reviewed and are otherwise negative except as noted above.    Blood pressure 144/76, pulse 66, height 5\' 4"  (1.626 m), weight 276 lb (125.193 kg).  General appearance: alert, cooperative, no distress, morbidly obese and on O2 Neck: no carotid bruit and no JVD Lungs: clear to auscultation bilaterally Heart: regular rate and rhythm Abdomen: morbid obesity Extremities: no edema Pulses: 2+ and symmetric Skin: Skin color, texture, turgor normal. No rashes or lesions Neurologic: Grossly normal  EKG NSR, QTc 410  ASSESSMENT AND PLAN:   Encounter for pre-operative cardiovascular clearance Pt needs rt lumpectomy under general anesthesia, sent by Dr Dalbert Batman for pre op cardiovascular clearance.   PAF (paroxysmal atrial fibrillation) ([place]) Cardioverted Nov 2016- holding NSR on Rythmol CHADs VASc=3  PULMONARY EMBOLISM, HX OF She has a history of remote PE/DVT. She has been on Coumadin for >79yrs. VQ in 2012 did not suggest PE.  COPD (chronic obstructive pulmonary disease) (HCC) - PFT's 04/19/2012 FEV1 50% (ratio 51) with 19% improvement p B2, dlco 42 > 99% corrected  On chronic O2, Dr Melvyn Novas follows  Essential hypertension Controlled  Morbid obesity ([place]) BMI 47   PLAN  OK from a cardiac standpoint for surgery without further cardiac work up. OK to stop Coumadin for 5 days pre op without bridging-  resume ASAP post op.   Kerin Ransom K PA-C 05/20/2015 4:18 PM

## 2015-05-20 NOTE — Assessment & Plan Note (Signed)
BMI 47 

## 2015-05-20 NOTE — Assessment & Plan Note (Addendum)
Cardioverted Nov 2016- holding NSR on Rythmol CHADs VASc=3

## 2015-05-20 NOTE — Assessment & Plan Note (Signed)
Pt needs rt lumpectomy under general anesthesia, sent by Dr Dalbert Batman for pre op cardiovascular clearance.

## 2015-05-20 NOTE — Patient Instructions (Signed)
Your physician wants you to follow-up in: 6 Months with Dr Crenshaw. You will receive a reminder letter in the mail two months in advance. If you don't receive a letter, please call our office to schedule the follow-up appointment.  

## 2015-05-26 ENCOUNTER — Other Ambulatory Visit: Payer: Self-pay | Admitting: General Surgery

## 2015-05-26 DIAGNOSIS — N6091 Unspecified benign mammary dysplasia of right breast: Secondary | ICD-10-CM

## 2015-05-30 ENCOUNTER — Ambulatory Visit (INDEPENDENT_AMBULATORY_CARE_PROVIDER_SITE_OTHER): Payer: BLUE CROSS/BLUE SHIELD | Admitting: General Practice

## 2015-05-30 DIAGNOSIS — I4891 Unspecified atrial fibrillation: Secondary | ICD-10-CM

## 2015-05-30 DIAGNOSIS — Z5181 Encounter for therapeutic drug level monitoring: Secondary | ICD-10-CM

## 2015-05-30 DIAGNOSIS — Z7901 Long term (current) use of anticoagulants: Secondary | ICD-10-CM

## 2015-05-30 DIAGNOSIS — Z8672 Personal history of thrombophlebitis: Secondary | ICD-10-CM

## 2015-05-30 DIAGNOSIS — I4892 Unspecified atrial flutter: Secondary | ICD-10-CM | POA: Diagnosis not present

## 2015-05-30 DIAGNOSIS — Z86718 Personal history of other venous thrombosis and embolism: Secondary | ICD-10-CM | POA: Diagnosis not present

## 2015-05-30 DIAGNOSIS — I480 Paroxysmal atrial fibrillation: Secondary | ICD-10-CM

## 2015-05-30 LAB — POCT INR: INR: 1.7

## 2015-05-30 NOTE — Progress Notes (Signed)
Pre visit review using our clinic review tool, if applicable. No additional management support is needed unless otherwise documented below in the visit note. 

## 2015-05-30 NOTE — Progress Notes (Signed)
I have reviewed and agree with the plan. 

## 2015-05-30 NOTE — Patient Instructions (Addendum)
5/19 - Stop coumadin today 5/20 - Take Xarelto (1 tablet) in the AM 5/21 - Take Xarelto (1 tablet) in the AM 5/22 - Take Xarelto (1 tablet) in the AM 5/23 - Nothing 5/24 - Nothing 5/25 - Nothing 5/26 - Take Xarelto and 2 tablet of coumadin 5/27 - Take Xarelto and 2 tablets of coumadin 5/28 - Take Xarelto and 1 1/2 tablets of coumadin 5/29 - Stop Xarelto and resume coumadin as prescribed.  Re-check INR on Wed, 5/31.

## 2015-06-02 ENCOUNTER — Encounter (HOSPITAL_COMMUNITY)
Admission: RE | Admit: 2015-06-02 | Discharge: 2015-06-02 | Disposition: A | Discharge status: Home / Self Care | Payer: BLUE CROSS/BLUE SHIELD | Source: Ambulatory Visit | Attending: General Surgery | Admitting: General Surgery

## 2015-06-02 ENCOUNTER — Encounter (HOSPITAL_COMMUNITY): Payer: Self-pay

## 2015-06-02 DIAGNOSIS — I509 Heart failure, unspecified: Secondary | ICD-10-CM | POA: Diagnosis not present

## 2015-06-02 DIAGNOSIS — Z79899 Other long term (current) drug therapy: Secondary | ICD-10-CM | POA: Diagnosis not present

## 2015-06-02 DIAGNOSIS — Z7951 Long term (current) use of inhaled steroids: Secondary | ICD-10-CM | POA: Diagnosis not present

## 2015-06-02 DIAGNOSIS — Z86711 Personal history of pulmonary embolism: Secondary | ICD-10-CM | POA: Diagnosis not present

## 2015-06-02 DIAGNOSIS — N62 Hypertrophy of breast: Secondary | ICD-10-CM | POA: Diagnosis not present

## 2015-06-02 DIAGNOSIS — Z803 Family history of malignant neoplasm of breast: Secondary | ICD-10-CM | POA: Diagnosis not present

## 2015-06-02 DIAGNOSIS — E039 Hypothyroidism, unspecified: Secondary | ICD-10-CM | POA: Diagnosis not present

## 2015-06-02 DIAGNOSIS — Z9981 Dependence on supplemental oxygen: Secondary | ICD-10-CM | POA: Diagnosis not present

## 2015-06-02 DIAGNOSIS — F329 Major depressive disorder, single episode, unspecified: Secondary | ICD-10-CM | POA: Diagnosis not present

## 2015-06-02 DIAGNOSIS — Z7901 Long term (current) use of anticoagulants: Secondary | ICD-10-CM | POA: Diagnosis not present

## 2015-06-02 DIAGNOSIS — J449 Chronic obstructive pulmonary disease, unspecified: Secondary | ICD-10-CM | POA: Diagnosis not present

## 2015-06-02 DIAGNOSIS — K219 Gastro-esophageal reflux disease without esophagitis: Secondary | ICD-10-CM | POA: Diagnosis not present

## 2015-06-02 DIAGNOSIS — Z6841 Body Mass Index (BMI) 40.0 and over, adult: Secondary | ICD-10-CM | POA: Diagnosis not present

## 2015-06-02 DIAGNOSIS — E7800 Pure hypercholesterolemia, unspecified: Secondary | ICD-10-CM | POA: Diagnosis not present

## 2015-06-02 DIAGNOSIS — I4892 Unspecified atrial flutter: Secondary | ICD-10-CM | POA: Diagnosis not present

## 2015-06-02 DIAGNOSIS — Z87891 Personal history of nicotine dependence: Secondary | ICD-10-CM | POA: Diagnosis not present

## 2015-06-02 DIAGNOSIS — I110 Hypertensive heart disease with heart failure: Secondary | ICD-10-CM | POA: Diagnosis not present

## 2015-06-02 DIAGNOSIS — I493 Ventricular premature depolarization: Secondary | ICD-10-CM | POA: Diagnosis not present

## 2015-06-02 DIAGNOSIS — Z86718 Personal history of other venous thrombosis and embolism: Secondary | ICD-10-CM | POA: Diagnosis not present

## 2015-06-02 DIAGNOSIS — I4891 Unspecified atrial fibrillation: Secondary | ICD-10-CM | POA: Diagnosis not present

## 2015-06-02 HISTORY — DX: Pneumonia, unspecified organism: J18.9

## 2015-06-02 HISTORY — DX: Hypothyroidism, unspecified: E03.9

## 2015-06-02 HISTORY — DX: Other specified postprocedural states: Z98.890

## 2015-06-02 HISTORY — DX: Unspecified osteoarthritis, unspecified site: M19.90

## 2015-06-02 HISTORY — DX: Malignant neoplasm of unspecified site of unspecified female breast: C50.919

## 2015-06-02 HISTORY — DX: Other specified postprocedural states: R11.2

## 2015-06-02 HISTORY — DX: Peripheral vascular disease, unspecified: I73.9

## 2015-06-02 HISTORY — DX: Anemia, unspecified: D64.9

## 2015-06-02 LAB — COMPREHENSIVE METABOLIC PANEL
ALT: 15 U/L (ref 14–54)
AST: 14 U/L — ABNORMAL LOW (ref 15–41)
Albumin: 3.3 g/dL — ABNORMAL LOW (ref 3.5–5.0)
Alkaline Phosphatase: 86 U/L (ref 38–126)
Anion gap: 5 (ref 5–15)
BUN: 12 mg/dL (ref 6–20)
CO2: 34 mmol/L — ABNORMAL HIGH (ref 22–32)
Calcium: 8.8 mg/dL — ABNORMAL LOW (ref 8.9–10.3)
Chloride: 102 mmol/L (ref 101–111)
Creatinine, Ser: 0.85 mg/dL (ref 0.44–1.00)
GFR calc Af Amer: >60 mL/min (ref >60)
GFR calc non Af Amer: >60 mL/min (ref >60)
Glucose, Bld: 107 mg/dL — ABNORMAL HIGH (ref 65–99)
Potassium: 3.6 mmol/L (ref 3.5–5.1)
Sodium: 141 mmol/L (ref 135–145)
Total Bilirubin: 0.9 mg/dL (ref 0.3–1.2)
Total Protein: 7.1 g/dL (ref 6.5–8.1)

## 2015-06-02 LAB — CBC WITH DIFFERENTIAL/PLATELET
Basophils Absolute: 0 10*3/uL (ref 0.0–0.1)
Basophils Relative: 0 %
Eosinophils Absolute: 0.2 10*3/uL (ref 0.0–0.7)
Eosinophils Relative: 2 %
HCT: 34 % — ABNORMAL LOW (ref 36.0–46.0)
Hemoglobin: 10.7 g/dL — ABNORMAL LOW (ref 12.0–15.0)
Lymphocytes Relative: 32 %
Lymphs Abs: 2.8 10*3/uL (ref 0.7–4.0)
MCH: 27.1 pg (ref 26.0–34.0)
MCHC: 31.5 g/dL (ref 30.0–36.0)
MCV: 86.1 fL (ref 78.0–100.0)
Monocytes Absolute: 0.7 10*3/uL (ref 0.1–1.0)
Monocytes Relative: 8 %
Neutro Abs: 5 10*3/uL (ref 1.7–7.7)
Neutrophils Relative %: 58 %
Platelets: 253 10*3/uL (ref 150–400)
RBC: 3.95 MIL/uL (ref 3.87–5.11)
RDW: 16.5 % — ABNORMAL HIGH (ref 11.5–15.5)
WBC: 8.7 10*3/uL (ref 4.0–10.5)

## 2015-06-02 LAB — PROTIME-INR
INR: 3.3 — ABNORMAL HIGH (ref 0.00–1.49)
Prothrombin Time: 32.9 seconds — ABNORMAL HIGH (ref 11.6–15.2)

## 2015-06-02 NOTE — Pre-Procedure Instructions (Addendum)
[name] [name]  [id number]      [facility] - [place], [place] [address] [place] [address] Phone: [phone] Fax: [phone]  [place], [place] - [place] [address] Phone: [phone] Fax: [phone]    Your procedure is scheduled on 06/05/15.  Report to [facility] Admitting at 530 A.M.  Call this number if you have problems the morning of surgery:  [phone]   Remember:  Do not eat food or drink liquids after midnight.  Take these medicines the morning of surgery with A SIP OF WATER inhalers(bring albuterol), nebulizer, propafenone(rythmol), diltiazem(cardizem),fluoxetine(prozac), levothyroxine, prilosec  STOP all herbel meds, nsaids (aleve,naproxen,advil,ibuprofen) Now including all vitamins(cal-vit D,vit D,glucosamine-chondroitin,            Coumadin per dr(5 days prior-05/31/15)   Do not wear jewelry, make-up or nail polish.  Do not wear lotions, powders, or perfumes.  You may wear deodorant.  Do not shave 48 hours prior to surgery.  Men may shave face and neck.  Do not bring valuables to the hospital.  [facility] is not responsible for any belongings or valuables.  Contacts, dentures or bridgework may not be worn into surgery.  Leave your suitcase in the car.  After surgery it may be brought to your room.  For patients admitted to the hospital, discharge time will be determined by your treatment team.  Patients discharged the day of surgery will not be allowed to drive home.   Name and phone number of your driver:    Special instructions:   Special Instructions: [place] - Preparing for Surgery  Before surgery, you can play an important role.  Because skin is not sterile, your skin needs to be as free of germs as possible.  You can reduce the number of germs on you skin by washing with CHG (chlorahexidine gluconate) soap before  surgery.  CHG is an antiseptic cleaner which kills germs and bonds with the skin to continue killing germs even after washing.  Please DO NOT use if you have an allergy to CHG or antibacterial soaps.  If your skin becomes reddened/irritated stop using the CHG and inform your nurse when you arrive at Short Stay.  Do not shave (including legs and underarms) for at least 48 hours prior to the first CHG shower.  You may shave your face.  Please follow these instructions carefully:   1.  Shower with CHG Soap the night before surgery and the morning of Surgery.  2.  If you choose to wash your hair, wash your hair first as usual with your normal shampoo.  3.  After you shampoo, rinse your hair and body thoroughly to remove the Shampoo.  4.  Use CHG as you would any other liquid soap.  You can apply chg directly  to the skin and wash gently with scrungie or a clean washcloth.  5.  Apply the CHG Soap to your body ONLY FROM THE NECK DOWN.  Do not use on open wounds or open sores.  Avoid contact with your eyes ears, mouth and genitals (private parts).  Wash genitals (private parts)       with your normal soap.  6.  Wash thoroughly, paying special attention to the area where your surgery will be performed.  7.  Thoroughly rinse your body with warm water from the neck down.  8.  DO  NOT shower/wash with your normal soap after using and rinsing off the CHG Soap.  9.  Pat yourself dry with a clean towel.            10.  Wear clean pajamas.            11.  Place clean sheets on your bed the night of your first shower and do not sleep with pets.  Day of Surgery  Do not apply any lotions/deodorants the morning of surgery.  Please wear clean clothes to the hospital/surgery center.  Please read over the following fact sheets that you were given. Pain Booklet

## 2015-06-02 NOTE — H&P (Signed)
[name] [name]  Location: [facility] Patient #: [id number] DOB: 1950/10/07 Married / Language: English / Race: White Female      History of Present Illness The patient is a 65 year old female who presents with a breast mass. This is a pleasant 65 year old woman with numerous medical problems. Referred by Dr. Enriqueta Shutter at the Breast [facility] for evaluation of recent right breast biopsy showing atypical lobular hyperplasia in the upper outer quadrant of the right breast. Dr. Cathlean Cower is her PCP. Dr. Kirk Ruths is her cardiologist. Dr. Cheri Fowler is her gynecologist.  The only breast problems she's had in the past was a wire localized biopsy which was benign. She does not remember the details. She gets annual mammograms. This year they saw a focal area of calcifications in the right breast, upper outer quadrant, 1.3 cm span.. Biopsy shows atypical lobular hyperplasia and fibroadenoma. I discussed this with her. I advised conservative lumpectomy to exclude in situ cancer and she is completely in favor of that.  She is on Coumadin. History of atrial fibrillation and flutter. History of deep venous thrombosis and pulmonary embolism. Morbid obesity. Oxygen dependent COPD with asthma. Hypertension. Depression. PVCs. Congestive heart failure.  She is married. Her husband is not here today. Her health is stable. She denies tobacco.  Family history reveals breast cancer in a paternal aunt and 2 paternal cousins. No pancreatic or ovarian cancer.  She will be scheduled for right breast lumpectomy with radioactive seed localization. I discussed the indications, details, techniques, and numerous risk of the surgery. She is aware of the risk of bleeding, infection, cosmetic deformity, nerve damage with chronic pain, reoperation if this turns out to be cancer. She is also well aware of the risk of cardiac, pulmonary, and thromboembolic problems. She  understands all these issues well. All her questions were answered. She agrees with this plan.  We will ask Dr. Cathlean Cower to take her off of Coumadin 5 days preop and decide whether she needs a bridge or not.  We will request cardiac risk assessment with Dr. Kirk Ruths.   Addendum Note(Luan Urbani M. Dalbert Batman MD; 05/22/2015 9:16 AM) We have received and noted from Dr. Cathlean Cower stating that she is okay for surgery. He is going to ask Villa Herb, RN to provide Lovenox bridge while holding the Coumadin. I have also received a note from [facility] [place] with the cardiology group and he summarized all of her medical problems and said that is okay from a cardiac standpoint to proceed with surgery without further cardiac workup. He also stated it was okay to stop Coumadin for 5 days preop without bridging.   Other Problems  Anxiety Disorder Arthritis Asthma Atrial Fibrillation Chronic Obstructive Lung Disease Congestive Heart Failure Depression Emphysema Of Lung Gastroesophageal Reflux Disease General anesthesia - complications Heart murmur High blood pressure Home Oxygen Use Hypercholesterolemia Lump In Breast Pulmonary Embolism / Blood Clot in Legs Thyroid Disease  Past Surgical History  Breast Biopsy Right. Cataract Surgery Right. Hysterectomy (not due to cancer) - Complete Tonsillectomy  Diagnostic Studies History  Colonoscopy 1-5 years ago Mammogram within last year Pap Smear 1-5 years ago  Allergies  No Known Drug Allergies  Medication History  Aclidinium Bromide (400MCG/ACT Aero Pow Br Act, Inhalation) Active. Albuterol Sulfate HFA (108 (90 Base)MCG/ACT Aerosol Soln, Inhalation) Active. Atorvastatin Calcium (10MG  Tablet, Oral) Active. Symbicort (160-4.5MCG/ACT Aerosol, Inhalation) Active. Calcium Carbonate (600MG  Tablet, Oral) Active. Cetirizine HCl (10MG  Tablet, Oral) Active. Vitamin D (1000UNIT  Tablet, Oral)  Active. Dextromethorphan-GG (60-1200MG  Tablet ER, Oral) Active. DiltiaZEM HCl ER Coated Beads (120MG  Capsule ER 24HR, Oral) Active. PROzac (40MG  Capsule, Oral) Active. Furosemide (80MG  Tablet, Oral) Active. Glucosamine Sulfate-MSM (500-400MG  Capsule, Oral) Active. Medications Reconciled Warfarin Sodium (7.5MG  Tablet, Oral) Active. Propafenone HCl ER (325MG  Capsule ER 12HR, Oral) Active. PredniSONE (10MG  Tablet, Oral) Active. Levothyroxine Sodium (25MCG Tablet, Oral) Active. Klor-Con M20 [facility] Tablet ER, Oral) Active. Fluticasone Propionate (50MCG/ACT Suspension, Nasal) Active. Enoxaparin Sodium (120MG /0.8ML Solution, Subcutaneous) Active. DiltiaZEM HCl ER Coated Beads (240MG  Capsule ER 24HR, Oral) Active.  Social History  Alcohol use Remotely quit alcohol use. Caffeine use Carbonated beverages, Coffee, Tea. No drug use Tobacco use Former smoker.  [place] Alcohol Abuse Father, Mother. Arthritis Brother, Mother. Colon Polyps Brother. Depression Mother. Heart Disease Mother. Heart disease in female family member before age 80 Hypertension Father, Mother. Seizure disorder Daughter. Thyroid problems Brother.  Pregnancy / Birth History  Age at menarche 73 years. Age of menopause 28-60 Gravida 3 Maternal age 53-25 Para 3    Review of Systems  General Present- Fatigue and Weight Gain. Not Present- Appetite Loss, Chills, Fever, Night Sweats and Weight Loss. Skin Present- Dryness and Rash. Not Present- Change in Wart/Mole, Hives, Jaundice, New Lesions, Non-Healing Wounds and Ulcer. HEENT Present- Earache, Hearing Loss, Hoarseness, Nose Bleed, Seasonal Allergies, Sinus Pain, Sore Throat, Visual Disturbances and Wears glasses/contact lenses. Not Present- Oral Ulcers, Ringing in the Ears and Yellow Eyes. Respiratory Present- Chronic Cough, Difficulty Breathing and Wheezing. Not Present- Bloody sputum and Snoring. Breast Not Present- Breast Mass, Breast  Pain, Nipple Discharge and Skin Changes. Cardiovascular Present- Difficulty Breathing Lying Down, Palpitations, Rapid Heart Rate, Shortness of Breath and Swelling of Extremities. Not Present- Chest Pain and Leg Cramps. Gastrointestinal Present- Constipation, Excessive gas, Gets full quickly at meals and Nausea. Not Present- Abdominal Pain, Bloating, Bloody Stool, Change in Bowel Habits, Chronic diarrhea, Difficulty Swallowing, Hemorrhoids, Indigestion, Rectal Pain and Vomiting. Female Genitourinary Present- Frequency and Nocturia. Not Present- Painful Urination, Pelvic Pain and Urgency. Musculoskeletal Present- Joint Pain, Joint Stiffness, Muscle Pain, Muscle Weakness and Swelling of Extremities. Not Present- Back Pain. Neurological Present- Decreased Memory, Tingling, Tremor, Trouble walking and Weakness. Not Present- Fainting, Headaches, Numbness and Seizures. Psychiatric Present- Anxiety, Depression, Fearful and Frequent crying. Not Present- Bipolar and Change in Sleep Pattern. Endocrine Present- Excessive Hunger and Heat Intolerance. Not Present- Cold Intolerance, Hair Changes, Hot flashes and New Diabetes. Hematology Present- Easy Bruising. Not Present- Excessive bleeding, Gland problems, HIV and Persistent Infections.  Vitals  Weight: 277 lb Height: 64in Body Surface Area: 2.25 m Body Mass Index: 47.55 kg/m  Temp.: 97.68F  Pulse: 73 (Regular)  BP: 130/78 (Sitting, Left Arm, Standard)     Physical Exam  General Mental Status-Alert. General Appearance-Consistent with stated age. Hydration-Well hydrated. Voice-Normal. Note: Examined sitting in a chair. Alert and oriented. Friendly and cooperative. Portable oxygen in place. BMI 47.5   Head and Neck Head-normocephalic, atraumatic with no lesions or palpable masses. Trachea-midline. Thyroid Gland Characteristics - normal size and consistency.  Eye Eyeball - Bilateral-Extraocular movements  intact. Sclera/Conjunctiva - Bilateral-No scleral icterus.  Chest and Lung Exam Note: Breath sounds are distant. No wheezes or rhonchi.   Breast Breast - Left-Symmetric, Non Tender, No Biopsy scars, no Dimpling, No Inflammation, No Lumpectomy scars, No Mastectomy scars, No Peau d' Orange. Breast - Right-Symmetric, Non Tender, No Biopsy scars, no Dimpling, No Inflammation, No Lumpectomy scars, No Mastectomy scars, No Peau d' Orange. Breast Lump-No Palpable Breast Mass. Note: Breasts  are large and pendulous. Recent biopsy right breast but no hematoma. No palpable mass or other skin changes. No axillary adenopathy.   Cardiovascular Cardiovascular examination reveals -normal heart sounds, regular rate and rhythm with no murmurs and normal pedal pulses bilaterally.  Abdomen Inspection Inspection of the abdomen reveals - No Hernias. Skin - Scar - no surgical scars. Palpation/Percussion Palpation and Percussion of the abdomen reveal - Soft, Non Tender, No Rebound tenderness, No Rigidity (guarding) and No hepatosplenomegaly. Auscultation Auscultation of the abdomen reveals - Bowel sounds normal. Note: Morbidly obese   Neurologic Neurologic evaluation reveals -alert and oriented x 3 with no impairment of recent or remote memory. Mental Status-Normal.  Musculoskeletal Normal Exam - Left-Upper Extremity Strength Normal and Lower Extremity Strength Normal. Normal Exam - Right-Upper Extremity Strength Normal and Lower Extremity Strength Normal.  Lymphatic Head & Neck  General Head & Neck Lymphatics: Bilateral - Description - Normal. Axillary  General Axillary Region: Bilateral - Description - Normal. Tenderness - Non Tender. Femoral & Inguinal  Generalized Femoral & Inguinal Lymphatics: Bilateral - Description - Normal. Tenderness - Non Tender.    Assessment & Plan  ATYPICAL LOBULAR HYPERPLASIA OF RIGHT BREAST (N60.91)    Your recent breast imaging studies  and biopsy show a focal area of calcifications in the upper outer quadrant of the right breast. The pathology report of the biopsy shows atypical lobular hyperplasia Most likely this is not cancer, but there is a 10-20% chance you could have a very early in situ cancer This area should therefore be conservatively excised, and you have agreed to that  you will be scheduled for right breast lumpectomy with radioactive seed localization We have discussed the indications, techniques, and numerous risk of the surgery in detail  you will need to stop your Coumadin 5 days preop, and we will ask Dr. Cathlean Cower to supervise that  We will need cardiac clearance with Dr. Kirk Ruths The surgery will be done at [facility] hospital because of your heart and lung problems.  Please read the printed information that we gave you  HYPERTENSIVE CHF (I11.0) OBESITY, MORBID, BMI 40.0-49.9 (E66.01) HISTORY OF PULMONARY EMBOLUS (PE) (Z86.711) ANTICOAGULATED ON COUMADIN (Z51.81) COPD WITH ASTHMA (J44.9) OXYGEN DEPENDENT (Z99.81) ATRIAL FIBRILLATION AND FLUTTER (I48.91) HISTORY OF DEPRESSION (Z86.59)

## 2015-06-03 NOTE — Progress Notes (Signed)
Anesthesia Chart Review:  Pt is a 65 year old female scheduled for R breast lumpectomy with radioactive seed localization on 06/05/2015 with Dr. Dalbert Batman.   Cardiologist is Dr. Kirk Ruths; pt cleared for surgery by Kerin Ransom, PA 05/20/15 note. PCP is Dr. Cathlean Cower. Pulmonologist is Dr. Christinia Gully.   PMH includes:  Atrial fibrillation with flutter (s/p cardioversion 11/2014), HTN, CHF, hyperlipidemia, COPD,uses home 02 at 2L, PE, DVT, asthma, hypothyroidism, anemia, breast cancer. Former smoker. BMI 47  Medications include: tudorza pressair, albuterol, lipitor, symbicort, diltiazem, lasix, potassium, levalbuterol, levothyroxine, prilosec, propafenone, xarelto, coumadin. Coumadin stopped 05/30/15. Xarelto only taken 5/20-5/22/17.   Preoperative labs reviewed.  PT 32.9. Will repeat DOS.   Chest x-ray 11/27/14 reviewed.  1. Mild enlargement of the cardiopericardial silhouette, with suspected pulmonary venous hypertension but no overt edema. 2. Scarring or subsegmental atelectasis at the left lung base.  EKG 05/20/15: NSR. Possible inferior infarct, age undetermined.   Nuclear stress test 03/29/13: Normal stress nuclear study. LV Ejection Fraction: 61%. LV Wall Motion: NL LV Function; NL Wall Motion  Echo 11/16/11:  - Left ventricle: The cavity size was normal. Wall thickness was increased in a pattern of moderate LVH. Systolic function was normal. The estimated ejection fraction was in the range of 60% to 65%. Wall motion was normal; there were no regional wall motion abnormalities. - Mitral valve: Calcified annulus. - Left atrium: The atrium was mildly dilated. - Pulmonary arteries: PA peak pressure: 63mm Hg (S).  Cardiac cath 11/26/00:  1. The left main was normal. 2. The LAD was normal. 3. The circumflex was normal. 4. The right coronary artery was a large dominant vessel and was normal.  If PT acceptable DOS, I anticipate pt can proceed with surgery as scheduled.   Willeen Cass,  FNP-BC [facility] Short Stay Surgical Center/Anesthesiology Phone: [phone] 06/03/2015 1:23 PM

## 2015-06-04 ENCOUNTER — Ambulatory Visit
Admission: RE | Admit: 2015-06-04 | Discharge: 2015-06-04 | Disposition: A | Discharge status: Home / Self Care | Payer: BLUE CROSS/BLUE SHIELD | Source: Ambulatory Visit | Attending: General Surgery | Admitting: General Surgery

## 2015-06-04 DIAGNOSIS — N6091 Unspecified benign mammary dysplasia of right breast: Secondary | ICD-10-CM

## 2015-06-04 MED ORDER — VANCOMYCIN HCL 10 G IV SOLR
1500.0000 mg | INTRAVENOUS | Status: AC
  Administered 2015-06-05: 1500 mg via INTRAVENOUS
  Filled 2015-06-04: qty 1500

## 2015-06-05 ENCOUNTER — Encounter (HOSPITAL_COMMUNITY)
Admission: RE | Disposition: A | Discharge status: Home / Self Care | Payer: Self-pay | Source: Ambulatory Visit | Attending: General Surgery

## 2015-06-05 ENCOUNTER — Ambulatory Visit (HOSPITAL_COMMUNITY): Payer: BLUE CROSS/BLUE SHIELD | Admitting: Vascular Surgery

## 2015-06-05 ENCOUNTER — Ambulatory Visit
Admission: RE | Admit: 2015-06-05 | Discharge: 2015-06-05 | Disposition: A | Discharge status: Home / Self Care | Payer: BLUE CROSS/BLUE SHIELD | Source: Ambulatory Visit | Attending: General Surgery | Admitting: General Surgery

## 2015-06-05 ENCOUNTER — Ambulatory Visit (HOSPITAL_COMMUNITY)
Admission: RE | Admit: 2015-06-05 | Discharge: 2015-06-05 | Disposition: A | Discharge status: Home / Self Care | Payer: BLUE CROSS/BLUE SHIELD | Source: Ambulatory Visit | Attending: General Surgery | Admitting: General Surgery

## 2015-06-05 ENCOUNTER — Encounter (HOSPITAL_COMMUNITY): Payer: Self-pay | Admitting: *Deleted

## 2015-06-05 ENCOUNTER — Ambulatory Visit (HOSPITAL_COMMUNITY): Payer: BLUE CROSS/BLUE SHIELD | Admitting: Emergency Medicine

## 2015-06-05 DIAGNOSIS — Z86711 Personal history of pulmonary embolism: Secondary | ICD-10-CM | POA: Insufficient documentation

## 2015-06-05 DIAGNOSIS — N6091 Unspecified benign mammary dysplasia of right breast: Secondary | ICD-10-CM

## 2015-06-05 DIAGNOSIS — F329 Major depressive disorder, single episode, unspecified: Secondary | ICD-10-CM | POA: Insufficient documentation

## 2015-06-05 DIAGNOSIS — Z6841 Body Mass Index (BMI) 40.0 and over, adult: Secondary | ICD-10-CM | POA: Insufficient documentation

## 2015-06-05 DIAGNOSIS — N62 Hypertrophy of breast: Secondary | ICD-10-CM | POA: Diagnosis not present

## 2015-06-05 DIAGNOSIS — Z87891 Personal history of nicotine dependence: Secondary | ICD-10-CM | POA: Insufficient documentation

## 2015-06-05 DIAGNOSIS — D4861 Neoplasm of uncertain behavior of right breast: Secondary | ICD-10-CM | POA: Diagnosis not present

## 2015-06-05 DIAGNOSIS — Z803 Family history of malignant neoplasm of breast: Secondary | ICD-10-CM | POA: Insufficient documentation

## 2015-06-05 DIAGNOSIS — I4892 Unspecified atrial flutter: Secondary | ICD-10-CM | POA: Diagnosis not present

## 2015-06-05 DIAGNOSIS — N6011 Diffuse cystic mastopathy of right breast: Secondary | ICD-10-CM | POA: Diagnosis not present

## 2015-06-05 DIAGNOSIS — Z7901 Long term (current) use of anticoagulants: Secondary | ICD-10-CM | POA: Insufficient documentation

## 2015-06-05 DIAGNOSIS — K219 Gastro-esophageal reflux disease without esophagitis: Secondary | ICD-10-CM | POA: Insufficient documentation

## 2015-06-05 DIAGNOSIS — I509 Heart failure, unspecified: Secondary | ICD-10-CM | POA: Insufficient documentation

## 2015-06-05 DIAGNOSIS — E039 Hypothyroidism, unspecified: Secondary | ICD-10-CM | POA: Insufficient documentation

## 2015-06-05 DIAGNOSIS — Z9981 Dependence on supplemental oxygen: Secondary | ICD-10-CM | POA: Insufficient documentation

## 2015-06-05 DIAGNOSIS — I110 Hypertensive heart disease with heart failure: Secondary | ICD-10-CM | POA: Diagnosis not present

## 2015-06-05 DIAGNOSIS — I493 Ventricular premature depolarization: Secondary | ICD-10-CM | POA: Insufficient documentation

## 2015-06-05 DIAGNOSIS — Z79899 Other long term (current) drug therapy: Secondary | ICD-10-CM | POA: Insufficient documentation

## 2015-06-05 DIAGNOSIS — I4891 Unspecified atrial fibrillation: Secondary | ICD-10-CM | POA: Insufficient documentation

## 2015-06-05 DIAGNOSIS — J449 Chronic obstructive pulmonary disease, unspecified: Secondary | ICD-10-CM | POA: Diagnosis not present

## 2015-06-05 DIAGNOSIS — Z7951 Long term (current) use of inhaled steroids: Secondary | ICD-10-CM | POA: Insufficient documentation

## 2015-06-05 DIAGNOSIS — E7800 Pure hypercholesterolemia, unspecified: Secondary | ICD-10-CM | POA: Insufficient documentation

## 2015-06-05 DIAGNOSIS — Z86718 Personal history of other venous thrombosis and embolism: Secondary | ICD-10-CM | POA: Insufficient documentation

## 2015-06-05 HISTORY — PX: BREAST LUMPECTOMY WITH RADIOACTIVE SEED LOCALIZATION: SHX6424

## 2015-06-05 LAB — PROTIME-INR
INR: 1.03 (ref 0.00–1.49)
Prothrombin Time: 13.7 seconds (ref 11.6–15.2)

## 2015-06-05 SURGERY — BREAST LUMPECTOMY WITH RADIOACTIVE SEED LOCALIZATION
Anesthesia: General | Site: Breast | Laterality: Right

## 2015-06-05 MED ORDER — METOCLOPRAMIDE HCL 5 MG/ML IJ SOLN
INTRAMUSCULAR | Status: AC
  Filled 2015-06-05: qty 2

## 2015-06-05 MED ORDER — SUCCINYLCHOLINE CHLORIDE 20 MG/ML IJ SOLN
INTRAMUSCULAR | Status: DC | PRN
  Administered 2015-06-05: 140 mg via INTRAVENOUS

## 2015-06-05 MED ORDER — ARTIFICIAL TEARS OP OINT
TOPICAL_OINTMENT | OPHTHALMIC | Status: AC
  Filled 2015-06-05: qty 3.5

## 2015-06-05 MED ORDER — CHLORHEXIDINE GLUCONATE 4 % EX LIQD: 1.0000 "application " | Freq: Once | CUTANEOUS | Status: DC

## 2015-06-05 MED ORDER — 0.9 % SODIUM CHLORIDE (POUR BTL) OPTIME
TOPICAL | Status: DC | PRN
  Administered 2015-06-05: 1000 mL

## 2015-06-05 MED ORDER — BUPIVACAINE-EPINEPHRINE 0.25% -1:200000 IJ SOLN
INTRAMUSCULAR | Status: DC | PRN
  Administered 2015-06-05: 30 mL

## 2015-06-05 MED ORDER — ACETAMINOPHEN 10 MG/ML IV SOLN
1000.0000 mg | INTRAVENOUS | Status: AC
  Administered 2015-06-05: 1000 mg via INTRAVENOUS
  Filled 2015-06-05: qty 100

## 2015-06-05 MED ORDER — ALBUTEROL SULFATE (2.5 MG/3ML) 0.083% IN NEBU: 2.5000 mg | INHALATION_SOLUTION | Freq: Once | RESPIRATORY_TRACT | Status: DC

## 2015-06-05 MED ORDER — FENTANYL CITRATE (PF) 250 MCG/5ML IJ SOLN
INTRAMUSCULAR | Status: AC
  Filled 2015-06-05: qty 5

## 2015-06-05 MED ORDER — HYDROMORPHONE HCL 1 MG/ML IJ SOLN: 0.2500 mg | INTRAMUSCULAR | Status: DC | PRN

## 2015-06-05 MED ORDER — DEXAMETHASONE SODIUM PHOSPHATE 10 MG/ML IJ SOLN
INTRAMUSCULAR | Status: DC | PRN
  Administered 2015-06-05: 10 mg via INTRAVENOUS

## 2015-06-05 MED ORDER — ROCURONIUM BROMIDE 100 MG/10ML IV SOLN
INTRAVENOUS | Status: DC | PRN
  Administered 2015-06-05: 30 mg via INTRAVENOUS

## 2015-06-05 MED ORDER — ROCURONIUM BROMIDE 50 MG/5ML IV SOLN
INTRAVENOUS | Status: AC
  Filled 2015-06-05: qty 1

## 2015-06-05 MED ORDER — ARTIFICIAL TEARS OP OINT
TOPICAL_OINTMENT | OPHTHALMIC | Status: DC | PRN
  Administered 2015-06-05: 1 via OPHTHALMIC

## 2015-06-05 MED ORDER — PROPOFOL 10 MG/ML IV BOLUS
INTRAVENOUS | Status: DC | PRN
  Administered 2015-06-05: 120 mg via INTRAVENOUS

## 2015-06-05 MED ORDER — FENTANYL CITRATE (PF) 100 MCG/2ML IJ SOLN
INTRAMUSCULAR | Status: DC | PRN
  Administered 2015-06-05: 50 ug via INTRAVENOUS

## 2015-06-05 MED ORDER — ONDANSETRON HCL 4 MG/2ML IJ SOLN
INTRAMUSCULAR | Status: AC
  Filled 2015-06-05: qty 2

## 2015-06-05 MED ORDER — METOCLOPRAMIDE HCL 5 MG/ML IJ SOLN
INTRAMUSCULAR | Status: DC | PRN
  Administered 2015-06-05: 10 mg via INTRAVENOUS

## 2015-06-05 MED ORDER — PHENYLEPHRINE 40 MCG/ML (10ML) SYRINGE FOR IV PUSH (FOR BLOOD PRESSURE SUPPORT)
PREFILLED_SYRINGE | INTRAVENOUS | Status: AC
  Filled 2015-06-05: qty 10

## 2015-06-05 MED ORDER — DEXAMETHASONE SODIUM PHOSPHATE 10 MG/ML IJ SOLN
INTRAMUSCULAR | Status: AC
  Filled 2015-06-05: qty 1

## 2015-06-05 MED ORDER — MIDAZOLAM HCL 2 MG/2ML IJ SOLN
INTRAMUSCULAR | Status: AC
  Filled 2015-06-05: qty 2

## 2015-06-05 MED ORDER — LACTATED RINGERS IV SOLN
INTRAVENOUS | Status: DC | PRN
  Administered 2015-06-05: 07:00:00 via INTRAVENOUS

## 2015-06-05 MED ORDER — BUPIVACAINE-EPINEPHRINE (PF) 0.25% -1:200000 IJ SOLN
INTRAMUSCULAR | Status: AC
  Filled 2015-06-05: qty 30

## 2015-06-05 MED ORDER — GLYCOPYRROLATE 0.2 MG/ML IJ SOLN
INTRAMUSCULAR | Status: DC | PRN
  Administered 2015-06-05: 0.6 mg via INTRAVENOUS

## 2015-06-05 MED ORDER — NEOSTIGMINE METHYLSULFATE 10 MG/10ML IV SOLN
INTRAVENOUS | Status: DC | PRN
  Administered 2015-06-05: 4 mg via INTRAVENOUS

## 2015-06-05 MED ORDER — PHENYLEPHRINE HCL 10 MG/ML IJ SOLN
INTRAMUSCULAR | Status: DC | PRN
  Administered 2015-06-05: 120 ug via INTRAVENOUS

## 2015-06-05 MED ORDER — PROPOFOL 10 MG/ML IV BOLUS
INTRAVENOUS | Status: AC
  Filled 2015-06-05: qty 20

## 2015-06-05 MED ORDER — LIDOCAINE HCL (CARDIAC) 20 MG/ML IV SOLN
INTRAVENOUS | Status: DC | PRN
  Administered 2015-06-05: 80 mg via INTRAVENOUS

## 2015-06-05 MED ORDER — ONDANSETRON HCL 4 MG/2ML IJ SOLN
INTRAMUSCULAR | Status: DC | PRN
  Administered 2015-06-05: 4 mg via INTRAVENOUS

## 2015-06-05 MED ORDER — ALBUTEROL SULFATE (2.5 MG/3ML) 0.083% IN NEBU
INHALATION_SOLUTION | RESPIRATORY_TRACT | Status: AC
  Administered 2015-06-05: 2.5 mg
  Filled 2015-06-05: qty 3

## 2015-06-05 MED ORDER — PROCHLORPERAZINE EDISYLATE 5 MG/ML IJ SOLN: 10.0000 mg | Freq: Once | INTRAMUSCULAR | Status: DC

## 2015-06-05 MED ORDER — HYDROCODONE-ACETAMINOPHEN 5-325 MG PO TABS: 1.0000 | ORAL_TABLET | Freq: Four times a day (QID) | ORAL | Status: DC | PRN

## 2015-06-05 SURGICAL SUPPLY — 52 items
APPLIER CLIP 9.375 MED OPEN (MISCELLANEOUS)
BINDER BREAST LRG (GAUZE/BANDAGES/DRESSINGS) IMPLANT
BINDER BREAST XLRG (GAUZE/BANDAGES/DRESSINGS) IMPLANT
BINDER BREAST XXLRG (GAUZE/BANDAGES/DRESSINGS) ×2 IMPLANT
BLADE SURG 15 STRL LF DISP TIS (BLADE) ×1 IMPLANT
BLADE SURG 15 STRL SS (BLADE) ×1
CANISTER SUCTION 2500CC (MISCELLANEOUS) ×2 IMPLANT
CHLORAPREP W/TINT 26ML (MISCELLANEOUS) ×2 IMPLANT
CLIP APPLIE 9.375 MED OPEN (MISCELLANEOUS) IMPLANT
COVER PROBE W GEL 5X96 (DRAPES) ×2 IMPLANT
COVER SURGICAL LIGHT HANDLE (MISCELLANEOUS) ×2 IMPLANT
DECANTER SPIKE VIAL GLASS SM (MISCELLANEOUS) ×2 IMPLANT
DERMABOND ADVANCED (GAUZE/BANDAGES/DRESSINGS)
DERMABOND ADVANCED .7 DNX12 (GAUZE/BANDAGES/DRESSINGS) IMPLANT
DEVICE DUBIN SPECIMEN MAMMOGRA (MISCELLANEOUS) ×2 IMPLANT
DRAPE CHEST BREAST 15X10 FENES (DRAPES) ×2 IMPLANT
DRAPE UTILITY XL STRL (DRAPES) ×2 IMPLANT
DRSG PAD ABDOMINAL 8X10 ST (GAUZE/BANDAGES/DRESSINGS) ×2 IMPLANT
ELECT CAUTERY BLADE 6.4 (BLADE) ×2 IMPLANT
ELECT REM PT RETURN 9FT ADLT (ELECTROSURGICAL) ×2
ELECTRODE REM PT RTRN 9FT ADLT (ELECTROSURGICAL) ×1 IMPLANT
GAUZE SPONGE 2X2 8PLY STRL LF (GAUZE/BANDAGES/DRESSINGS) ×1 IMPLANT
GLOVE BIOGEL PI IND STRL 7.0 (GLOVE) ×1 IMPLANT
GLOVE BIOGEL PI INDICATOR 7.0 (GLOVE) ×1
GLOVE EUDERMIC 7 POWDERFREE (GLOVE) ×2 IMPLANT
GLOVE SURG SS PI 7.0 STRL IVOR (GLOVE) ×2 IMPLANT
GLOVE SURG SS PI 7.5 STRL IVOR (GLOVE) ×2 IMPLANT
GOWN STRL REUS W/ TWL LRG LVL3 (GOWN DISPOSABLE) ×1 IMPLANT
GOWN STRL REUS W/ TWL XL LVL3 (GOWN DISPOSABLE) ×1 IMPLANT
GOWN STRL REUS W/TWL LRG LVL3 (GOWN DISPOSABLE) ×1
GOWN STRL REUS W/TWL XL LVL3 (GOWN DISPOSABLE) ×1
ILLUMINATOR WAVEGUIDE N/F (MISCELLANEOUS) IMPLANT
KIT BASIN OR (CUSTOM PROCEDURE TRAY) ×2 IMPLANT
KIT MARKER MARGIN INK (KITS) ×2 IMPLANT
LIGHT WAVEGUIDE WIDE FLAT (MISCELLANEOUS) IMPLANT
LIQUID BAND (GAUZE/BANDAGES/DRESSINGS) ×2 IMPLANT
NEEDLE HYPO 25X1 1.5 SAFETY (NEEDLE) ×2 IMPLANT
NS IRRIG 1000ML POUR BTL (IV SOLUTION) IMPLANT
PACK SURGICAL SETUP 50X90 (CUSTOM PROCEDURE TRAY) ×2 IMPLANT
PENCIL BUTTON HOLSTER BLD 10FT (ELECTRODE) ×2 IMPLANT
SPONGE GAUZE 2X2 STER 10/PKG (GAUZE/BANDAGES/DRESSINGS) ×1
SPONGE GAUZE 4X4 12PLY STER LF (GAUZE/BANDAGES/DRESSINGS) ×2 IMPLANT
SPONGE LAP 18X18 X RAY DECT (DISPOSABLE) ×2 IMPLANT
SUT MNCRL AB 4-0 PS2 18 (SUTURE) ×2 IMPLANT
SUT SILK 2 0 SH (SUTURE) ×2 IMPLANT
SUT VIC AB 3-0 SH 18 (SUTURE) ×2 IMPLANT
SYR BULB 3OZ (MISCELLANEOUS) ×2 IMPLANT
SYR CONTROL 10ML LL (SYRINGE) ×2 IMPLANT
TOWEL OR 17X24 6PK STRL BLUE (TOWEL DISPOSABLE) ×2 IMPLANT
TOWEL OR 17X26 10 PK STRL BLUE (TOWEL DISPOSABLE) ×2 IMPLANT
TUBE CONNECTING 12X1/4 (SUCTIONS) ×2 IMPLANT
YANKAUER SUCT BULB TIP NO VENT (SUCTIONS) ×2 IMPLANT

## 2015-06-05 NOTE — Interval H&P Note (Signed)
History and Physical Interval Note:  [id number] AM  [name] [name]  has presented today for surgery, with the diagnosis of atypical lobular hyperplasia right breast  The various methods of treatment have been discussed with the patient and family. After consideration of risks, benefits and other options for treatment, the patient has consented to  Procedure(s): RIGHT BREAST LUMPECTOMY WITH RADIOACTIVE SEED LOCALIZATION (Right) as a surgical intervention .  The patient's history has been reviewed, patient examined, no change in status, stable for surgery.  I have reviewed the patient's chart and labs.  Questions were answered to the patient's satisfaction.     Adin Hector

## 2015-06-05 NOTE — Op Note (Signed)
Patient Name:           [name] [name]   Date of Surgery:        06/05/2015  Pre op Diagnosis:      Atypical lobular hyperplasia right breast  Post op Diagnosis:    Same  Procedure:                 Right breast lumpectomy with radioactive seed localization and margin assessment  Surgeon:                     Edsel Petrin. Dalbert Batman, M.D., FACS  Assistant:                      Or staff  Operative Indications:    This is a pleasant 65 year old woman with numerous medical problems. Referred by Dr. Enriqueta Shutter at the [facility] for evaluation of recent right breast biopsy showing atypical lobular hyperplasia in the upper outer quadrant of the right breast. Dr. Cathlean Cower is her PCP. Dr. Kirk Ruths is her cardiologist. Dr. Cheri Fowler is her gynecologist.      The only breast problems she's had in the past was a wire localized biopsy which was benign. She does not remember the details. She gets annual mammograms. This year they saw a focal area of calcifications in the right breast, upper outer quadrant, 1.3 cm span.. Biopsy shows atypical lobular hyperplasia and fibroadenoma. I discussed this with her. I advised conservative lumpectomy to exclude in situ cancer and she is completely in favor of that.      She is on Coumadin. History of atrial fibrillation and flutter. History of deep venous thrombosis and pulmonary embolism. Morbid obesity. Oxygen dependent COPD with asthma. Hypertension. Depression. PVCs. Congestive heart failure.      She is scheduled for right breast lumpectomy with radioactive seed localization.        We have taken her off of Coumadin 5 days preop.   Operative Findings:       The radioactive seed was in the deep lateral right breast at about the 9:00 position.  I was able to hear the audible signal of the radioactive seed in the holding area preop.  The specimen mammogram looked good showing the seed and the original biopsy marker clip in the center  of the specimen.  There was no grossly abnormal palpable mass.  Procedure in Detail:          Following the induction of general endotracheal anesthesia the patient's right breast and chest wall were prepped and draped in a sterile fashion.  Surgical timeout was performed.  Intravenous antibiotics were given.  0.25% Marcaine with epinephrine was used as local infiltration anesthetic.  Using the neoprobe as a guide I designed a radially oriented incision in the lateral right breast at the 9:00 position.  The incision was made.  Dissection was carried down into the breast tissue and around the radioactive signal.  The specimen was removed and marked with silk sutures and a 6 color ink kit to orient the pathologist.  The specimen mammogram looked very good as described above.  Specimen was marked and sent to the lab.  The wound was irrigated with saline.  Hemostasis was achieved electrocautery.  The breast tissues were closed in 2 layers with interrupted 3-0 Vicryl and the skin closed with a running subcuticular 4-0 Monocryl and Dermabond.  Breast binder was placed.  The patient tolerated the procedure well  was taken to PACU in stable condition.  EBL 10 mL.  Counts correct.  Complications none.     Edsel Petrin. Dalbert Batman, M.D., FACS General and Minimally Invasive Surgery Breast and Colorectal Surgery  06/05/2015 8:47 AM

## 2015-06-05 NOTE — Anesthesia Procedure Notes (Signed)
Procedure Name: Intubation Date/Time: 06/05/2015 7:42 AM Performed by: Willeen Cass P Pre-anesthesia Checklist: Patient identified, Timeout performed, Emergency Drugs available, Suction available and Patient being monitored Patient Re-evaluated:Patient Re-evaluated prior to inductionOxygen Delivery Method: Circle system utilized Preoxygenation: Pre-oxygenation with 100% oxygen Intubation Type: IV induction Ventilation: Mask ventilation without difficulty and Oral airway inserted - appropriate to patient size Laryngoscope Size: Mac and 3 Grade View: Grade I Tube type: Oral Tube size: 7.0 mm Number of attempts: 1 Airway Equipment and Method: Stylet Placement Confirmation: ETT inserted through vocal cords under direct vision,  breath sounds checked- equal and bilateral and positive ETCO2 Secured at: 22 cm Tube secured with: Tape Dental Injury: Teeth and Oropharynx as per pre-operative assessment

## 2015-06-05 NOTE — Anesthesia Preprocedure Evaluation (Signed)
Anesthesia Evaluation  Patient identified by MRN, date of birth, ID band Patient awake    Reviewed: Allergy & Precautions, NPO status , Patient's Chart, lab work & pertinent test results  Airway Mallampati: II  TM Distance: >3 FB Neck ROM: Full    Dental no notable dental hx.    Pulmonary asthma , COPD, former smoker,    Pulmonary exam normal breath sounds clear to auscultation       Cardiovascular hypertension, Pt. on medications + Peripheral Vascular Disease and +CHF  Normal cardiovascular exam Rhythm:Regular Rate:Normal     Neuro/Psych negative neurological ROS  negative psych ROS   GI/Hepatic negative GI ROS, Neg liver ROS,   Endo/Other  Hypothyroidism Morbid obesity  Renal/GU negative Renal ROS  negative genitourinary   Musculoskeletal negative musculoskeletal ROS (+)   Abdominal   Peds negative pediatric ROS (+)  Hematology negative hematology ROS (+)   Anesthesia Other Findings   Reproductive/Obstetrics negative OB ROS                             Anesthesia Physical Anesthesia Plan  ASA: III  Anesthesia Plan: General   Post-op Pain Management:    Induction: Intravenous  Airway Management Planned: Oral ETT  Additional Equipment:   Intra-op Plan:   Post-operative Plan: Extubation in OR  Informed Consent: I have reviewed the patients History and Physical, chart, labs and discussed the procedure including the risks, benefits and alternatives for the proposed anesthesia with the patient or authorized representative who has indicated his/her understanding and acceptance.   Dental advisory given  Plan Discussed with: CRNA and Surgeon  Anesthesia Plan Comments:         Anesthesia Quick Evaluation

## 2015-06-05 NOTE — Anesthesia Postprocedure Evaluation (Signed)
Anesthesia Post Note  Patient: [name] [name]  Procedure(s) Performed: Procedure(s) (LRB): RIGHT BREAST LUMPECTOMY WITH RADIOACTIVE SEED LOCALIZATION (Right)  Patient location during evaluation: PACU Anesthesia Type: General Level of consciousness: awake and alert Pain management: pain level controlled Vital Signs Assessment: post-procedure vital signs reviewed and stable Respiratory status: spontaneous breathing, nonlabored ventilation, respiratory function stable and patient connected to nasal cannula oxygen Cardiovascular status: blood pressure returned to baseline and stable Postop Assessment: no signs of nausea or vomiting Anesthetic complications: no    Last Vitals:  Filed Vitals:   06/05/15 0611 06/05/15 0853  BP: 176/82   Pulse: 81   Temp: 36.7 C 36.4 C  Resp: 18     Last Pain: There were no vitals filed for this visit.               Brady Plant S

## 2015-06-05 NOTE — Discharge Instructions (Signed)
Do not take Coumadin today Restart your Coumadin tomorrow  Otherwise resume all of your regular medications        [place] Office Phone Number [phone]  BREAST BIOPSY/ PARTIAL MASTECTOMY: POST OP INSTRUCTIONS  Always review your discharge instruction sheet given to you by the facility where your surgery was performed.  IF YOU HAVE DISABILITY OR FAMILY LEAVE FORMS, YOU MUST BRING THEM TO THE OFFICE FOR PROCESSING.  DO NOT GIVE THEM TO YOUR DOCTOR.  1. A prescription for pain medication may be given to you upon discharge.  Take your pain medication as prescribed, if needed.  If narcotic pain medicine is not needed, then you may take acetaminophen (Tylenol) or ibuprofen (Advil) as needed. 2. Take your usually prescribed medications unless otherwise directed 3. If you need a refill on your pain medication, please contact your pharmacy.  They will contact our office to request authorization.  Prescriptions will not be filled after 5pm or on week-ends. 4. You should eat very light the first 24 hours after surgery, such as soup, crackers, pudding, etc.  Resume your normal diet the day after surgery. 5. Most patients will experience some swelling and bruising in the breast.  Ice packs and a good support bra will help.  Swelling and bruising can take several days to resolve.  6. It is common to experience some constipation if taking pain medication after surgery.  Increasing fluid intake and taking a stool softener will usually help or prevent this problem from occurring.  A mild laxative (Milk of Magnesia or Miralax) should be taken according to package directions if there are no bowel movements after 48 hours. 7. Unless discharge instructions indicate otherwise, you may remove your bandages 24-48 hours after surgery, and you may shower at that time.  You may have steri-strips (small skin tapes) in place directly over the incision.  These strips should be left on the skin  for 7-10 days.  If your surgeon used skin glue on the incision, you may shower in 24 hours.  The glue will flake off over the next 2-3 weeks.  Any sutures or staples will be removed at the office during your follow-up visit. 8. ACTIVITIES:  You may resume regular daily activities (gradually increasing) beginning the next day.  Wearing a good support bra or sports bra minimizes pain and swelling.  You may have sexual intercourse when it is comfortable. a. You may drive when you no longer are taking prescription pain medication, you can comfortably wear a seatbelt, and you can safely maneuver your car and apply brakes. b. RETURN TO WORK:  ______________________________________________________________________________________ 9. You should see your doctor in the office for a follow-up appointment approximately two weeks after your surgery.  Your doctors nurse will typically make your follow-up appointment when she calls you with your pathology report.  Expect your pathology report 2-3 business days after your surgery.  You may call to check if you do not hear from [place] after three days. 10. OTHER INSTRUCTIONS: _______________________________________________________________________________________________ _____________________________________________________________________________________________________________________________________ _____________________________________________________________________________________________________________________________________ _____________________________________________________________________________________________________________________________________  WHEN TO CALL YOUR DOCTOR: 1. Fever over 101.0 2. Nausea and/or vomiting. 3. Extreme swelling or bruising. 4. Continued bleeding from incision. 5. Increased pain, redness, or drainage from the incision.  The clinic staff is available to answer your questions during regular business hours.  Please dont hesitate  to call and ask to speak to one of the nurses for clinical concerns.  If you have a medical emergency, go to the nearest emergency room or  call 911.  A surgeon from [facility] is always on call at the hospital. ° °For further questions, please visit [email or web address]  °

## 2015-06-05 NOTE — Transfer of Care (Signed)
Immediate Anesthesia Transfer of Care Note  Patient: [name] [name]  Procedure(s) Performed: Procedure(s): RIGHT BREAST LUMPECTOMY WITH RADIOACTIVE SEED LOCALIZATION (Right)  Patient Location: PACU  Anesthesia Type:General  Level of Consciousness: awake, patient cooperative and lethargic  Airway & Oxygen Therapy: Patient Spontanous Breathing and Patient connected to face mask oxygen  Post-op Assessment: Report given to RN, Post -op Vital signs reviewed and stable and Patient moving all extremities  Post vital signs: Reviewed and stable  Last Vitals:  BP 163/88 HR 89 RR 15 SpO2 97% Resting comfortably, maintains good airway, denies pain.   Last Pain: There were no vitals filed for this visit.    Patients Stated Pain Goal: 2 (99991111 [id number])  Complications: No apparent anesthesia complications

## 2015-06-06 ENCOUNTER — Encounter (HOSPITAL_COMMUNITY): Payer: Self-pay | Admitting: General Surgery

## 2015-06-06 NOTE — Progress Notes (Signed)
Quick Note:  Inform patient of Pathology report,. Final pathology shows atypical lobular hyperplasia, but NO CANCER. Good news. Nothing further will need to be done . I will discuss in detail at next office visit.  hmi ______

## 2015-06-11 ENCOUNTER — Ambulatory Visit (INDEPENDENT_AMBULATORY_CARE_PROVIDER_SITE_OTHER): Payer: BLUE CROSS/BLUE SHIELD | Admitting: General Practice

## 2015-06-11 DIAGNOSIS — I480 Paroxysmal atrial fibrillation: Secondary | ICD-10-CM

## 2015-06-11 DIAGNOSIS — I4892 Unspecified atrial flutter: Secondary | ICD-10-CM

## 2015-06-11 DIAGNOSIS — Z86718 Personal history of other venous thrombosis and embolism: Secondary | ICD-10-CM

## 2015-06-11 DIAGNOSIS — Z8672 Personal history of thrombophlebitis: Secondary | ICD-10-CM

## 2015-06-11 DIAGNOSIS — Z7901 Long term (current) use of anticoagulants: Secondary | ICD-10-CM

## 2015-06-11 DIAGNOSIS — I4891 Unspecified atrial fibrillation: Secondary | ICD-10-CM

## 2015-06-11 DIAGNOSIS — Z5181 Encounter for therapeutic drug level monitoring: Secondary | ICD-10-CM

## 2015-06-11 LAB — POCT INR: INR: 1.7

## 2015-06-11 NOTE — Progress Notes (Signed)
Pre visit review using our clinic review tool, if applicable. No additional management support is needed unless otherwise documented below in the visit note. 

## 2015-06-11 NOTE — Progress Notes (Signed)
I have reviewed and agree with the plan. 

## 2015-07-07 ENCOUNTER — Other Ambulatory Visit: Payer: Self-pay | Admitting: *Deleted

## 2015-07-07 ENCOUNTER — Other Ambulatory Visit: Payer: Self-pay | Admitting: Cardiology

## 2015-07-07 ENCOUNTER — Telehealth: Payer: Self-pay | Admitting: Internal Medicine

## 2015-07-07 MED ORDER — KLOR-CON M20 20 MEQ PO TBCR: 20.0000 meq | EXTENDED_RELEASE_TABLET | Freq: Two times a day (BID) | ORAL | Status: DC

## 2015-07-07 NOTE — Telephone Encounter (Signed)
levothyroxine (SYNTHROID, LEVOTHROID) 25 MCG tablet  Medication   Date: 05/12/2015  Department: [facility]  Ordering/Authorizing: Biagio Borg, MD      Order Providers    Prescribing Provider Encounter Provider   Biagio Borg, MD Biagio Borg, MD    Medication Detail      Disp Refills Start End     levothyroxine (SYNTHROID, LEVOTHROID) 25 MCG tablet 90 tablet 2 05/12/2015     Sig - Route: Take 1 tablet (25 mcg total) by mouth daily before breakfast. - Oral    E-Prescribing Status: Receipt confirmed by pharmacy (05/12/2015 3:52 PM EDT)     Pharmacy    [place], [place]

## 2015-07-07 NOTE — Telephone Encounter (Signed)
Pt called in and wanted to speak with nurse.  She would not tell me what is for.  All she said that she is sick

## 2015-07-08 NOTE — Telephone Encounter (Signed)
Patient appointment made

## 2015-07-09 ENCOUNTER — Ambulatory Visit: Payer: BLUE CROSS/BLUE SHIELD

## 2015-07-10 ENCOUNTER — Telehealth: Payer: Self-pay | Admitting: General Practice

## 2015-07-10 ENCOUNTER — Ambulatory Visit (INDEPENDENT_AMBULATORY_CARE_PROVIDER_SITE_OTHER): Payer: BLUE CROSS/BLUE SHIELD | Admitting: Internal Medicine

## 2015-07-10 ENCOUNTER — Encounter: Payer: Self-pay | Admitting: Internal Medicine

## 2015-07-10 VITALS — BP 134/72 | HR 96 | Temp 99.0°F | Resp 20 | Wt 273.0 lb

## 2015-07-10 DIAGNOSIS — J438 Other emphysema: Secondary | ICD-10-CM

## 2015-07-10 DIAGNOSIS — J309 Allergic rhinitis, unspecified: Secondary | ICD-10-CM

## 2015-07-10 DIAGNOSIS — J019 Acute sinusitis, unspecified: Secondary | ICD-10-CM | POA: Diagnosis not present

## 2015-07-10 MED ORDER — LEVOFLOXACIN 500 MG PO TABS: 500.0000 mg | ORAL_TABLET | Freq: Every day | ORAL | Status: DC

## 2015-07-10 MED ORDER — METHYLPREDNISOLONE ACETATE 40 MG/ML IJ SUSP
40.0000 mg | Freq: Once | INTRAMUSCULAR | Status: AC
  Administered 2015-07-10: 40 mg via INTRAMUSCULAR

## 2015-07-10 NOTE — Progress Notes (Signed)
Pre visit review using our clinic review tool, if applicable. No additional management support is needed unless otherwise documented below in the visit note. 

## 2015-07-10 NOTE — Assessment & Plan Note (Signed)
stable overall by history and exam, recent data reviewed with pt, and pt to continue medical treatment as before,  to f/u any worsening symptoms or concerns SpO2 Readings from Last 3 Encounters:  07/10/15 90%  06/05/15 94%  06/02/15 95%

## 2015-07-10 NOTE — Assessment & Plan Note (Signed)
Mild to mod, for antibx course,  to f/u any worsening symptoms or concerns 

## 2015-07-10 NOTE — Assessment & Plan Note (Signed)
Mild to mod, for dpeomedrol IM, cont all other meds tx,  to f/u any worsening symptoms or concerns

## 2015-07-10 NOTE — Telephone Encounter (Signed)
Instructed patient to take 1/2 of coumadin dosage through next Tuesday.  Check INR on 7/5.  Patient is taking Levaquin.

## 2015-07-10 NOTE — Progress Notes (Signed)
Subjective:    Patient ID: [name] [name], female    DOB: 03-08-1950, 65 y.o.   MRN: [id number]  HPI   Here with 2-3 days acute onset fever, facial pain, pressure, headache, general weakness and malaise, and greenish d/c, with mild ST and cough, but pt denies chest pain, wheezing, increased sob or doe, orthopnea, PND, increased LE swelling, palpitations, dizziness or syncope.  Does have several wks ongoing nasal allergy symptoms with clearish congestion, itch and sneezing, without fever, pain, ST, cough, swelling or wheezing.  Overall good compliance with treatment, and good medicine tolerability.   Pt denies recent wt loss, night sweats, loss of appetite, or other constitutional symptoms Past Medical History  Diagnosis Date  . Chronic rhinitis   . Morbid obesity ([place])     target wt=179lb for BMI<30 Peak wt 282lb  . History of DVT (deep vein thrombosis)   . COPD (chronic obstructive pulmonary disease) ([place])     [place]. PFTs 07/25/06 FEV1 55% ratio 53, DLC0 51% HFA 50% 12/16/2009  . Depression   . Osteoporosis     last BMD 4/08 -2.4, intolerant of bisphosphonates  . Hypertension   . Atrial fibrillation and flutter ([place])   . Anxiety   . Glucose intolerance (impaired glucose tolerance)     on steroids  . VITAMIN D DEFICIENCY   . HYPERLIPIDEMIA   . PREMATURE VENTRICULAR CONTRACTIONS   . Rhabdomyolysis 07/29/2009  . FRACTURE, RIB, RIGHT 06/18/2009  . PULMONARY EMBOLISM, HX OF 06/19/2007  . Tremor   . Hx of cardiac catheterization     LHC (01/2001):  Normal Cors.  EF 65%.;  LexiScan Myoview (03/2013): No ischemia, EF 61%, normal  . Hx of echocardiogram     Echo (11/2011):  Mod LVH, EF 60-65%, no RWMA, MAC, mild LAE, PASP 34.  . Asthma   . CHF (congestive heart failure) ([place])   . Atypical lobular hyperplasia of right breast 05/09/2015  . Complication of anesthesia   . PONV (postoperative nausea and vomiting)   . Peripheral vascular disease ([facility])     hx dvt.pe  . Heart murmur   . Shortness of  breath dyspnea   . Pneumonia     hx  . Hypothyroidism   . Arthritis   . Breast CA ([place])     ?  Marland Kitchen Anemia     hx   Past Surgical History  Procedure Laterality Date  . Abdominal hysterectomy    . Tonsillectomy    . Skin graft to middle r finger  1975  . S/p left arm fracture with fall off chair    . Colonoscopy  10/22/2003, ?2006  . Cardioversion N/A 12/11/2014    Procedure: CARDIOVERSION;  Surgeon: Josue Hector, MD;  Location[facility] ENDOSCOPY;  Service: Cardiovascular;  Laterality: N/A;  . Tee without cardioversion N/A 12/11/2014    Procedure: TRANSESOPHAGEAL ECHOCARDIOGRAM (TEE);  Surgeon: Josue Hector, MD;  Location: [facility] ENDOSCOPY;  Service: Cardiovascular;  Laterality: N/A;  . Breast surgery Left     s/p mass removal  . Breast lumpectomy with radioactive seed localization Right 06/05/2015    Procedure: RIGHT BREAST LUMPECTOMY WITH RADIOACTIVE SEED LOCALIZATION;  Surgeon: Fanny Skates, MD;  Location: [place];  Service: General;  Laterality: Right;    reports that she quit smoking about 19 years ago. Her smoking use included Cigarettes. She has a 35 pack-year smoking history. She has never used smokeless tobacco. She reports that she does not drink alcohol or use illicit drugs. family  history includes Asthma in her brother and son; Heart disease in her mother; Throat cancer in her brother. Allergies  Allergen Reactions  . Duloxetine Other (See Comments)    REACTION: rhabdomyolysis  . Penicillins Other (See Comments)    SYNCOPE  . Latex Rash   Current Outpatient Prescriptions on File Prior to Visit  Medication Sig Dispense Refill  . Aclidinium Bromide (TUDORZA PRESSAIR) 400 MCG/ACT AEPB Inhale 1 puff into the lungs 2 (two) times daily. 3 each 1  . albuterol (PROVENTIL HFA;VENTOLIN HFA) 108 (90 Base) MCG/ACT inhaler Inhale 2 puffs into the lungs every 4 (four) hours as needed for wheezing or shortness of breath.    Marland Kitchen atorvastatin (LIPITOR) 10 MG tablet TAKE 1 TABLET DAILY 90  tablet 1  . budesonide-formoterol (SYMBICORT) 160-4.5 MCG/ACT inhaler Inhale 2 puffs into the lungs 2 (two) times daily.    . Calcium Carbonate-Vitamin D 600-400 MG-UNIT per tablet Take 1 tablet by mouth 2 (two) times daily.      . cetirizine (ZYRTEC) 10 MG tablet Take 10 mg by mouth at bedtime as needed for allergies.     . cholecalciferol (VITAMIN D) 1000 UNITS tablet Take 1,000 Units by mouth every morning.    Marland Kitchen Dextromethorphan-Guaifenesin (MUCINEX DM MAXIMUM STRENGTH) 60-1200 MG TB12 Take 1 tablet by mouth every 12 (twelve) hours as needed (cough/congestion).    Marland Kitchen diltiazem (CARDIZEM CD) 360 MG 24 hr capsule Take 1 capsule (360 mg total) by mouth daily. 90 capsule 1  . FLUoxetine (PROZAC) 40 MG capsule Take 40 mg by mouth daily.    . furosemide (LASIX) 80 MG tablet Take one and half (1.5) tablet (120 mg total) by mouth every morning and take one (1) tablet (80 mg total) by mouth in the afternoon.    Marland Kitchen glucosamine-chondroitin 500-400 MG tablet Take 1 tablet by mouth every morning.     Marland Kitchen HYDROcodone-acetaminophen (NORCO) 5-325 MG tablet Take 1-2 tablets by mouth every 6 (six) hours as needed for moderate pain or severe pain. 30 tablet 0  . KLOR-CON M20 20 MEQ tablet Take 1 tablet (20 mEq total) by mouth 2 (two) times daily. 180 tablet 2  . levalbuterol (XOPENEX) 1.25 MG/3ML nebulizer solution Take 1.25 mg by nebulization every 4 (four) hours as needed for wheezing or shortness of breath.    . levothyroxine (SYNTHROID, LEVOTHROID) 25 MCG tablet Take 1 tablet (25 mcg total) by mouth daily before breakfast. 90 tablet 2  . mometasone (NASONEX) 50 MCG/ACT nasal spray Place 2 sprays into the nose 2 (two) times daily as needed (For nasal congestion.).     Marland Kitchen omeprazole (PRILOSEC) 20 MG capsule Take 40 mg by mouth daily.    . propafenone (RYTHMOL SR) 325 MG 12 hr capsule TAKE 1 CAPSULE TWICE A DAY 180 capsule 1  . rivaroxaban (XARELTO) 10 MG TABS tablet Take 10 mg by mouth daily. 5/20-5/22/17 and resume  as directed per dr after surgery    . sodium chloride (OCEAN) 0.65 % SOLN nasal spray Place 2 sprays into both nostrils 2 (two) times daily as needed for congestion.     Marland Kitchen warfarin (COUMADIN) 7.5 MG tablet TAKE AS DIRECTED BY COUMADIN CLINIC (Patient taking differently: Take 7.5-11.25 mg by mouth daily at 6 PM. TAKE AS DIRECTED BY COUMADIN CLINIC Pt to take 1.5 Tablets (11.25mg ) on Wednesday and Saturday, 1 tablet (7.5) all other days) 120 tablet 1   No current facility-administered medications on file prior to visit.   Review of Systems  Constitutional:  Negative for unusual diaphoresis or night sweats HENT: Negative for ear swelling or discharge Eyes: Negative for worsening visual haziness  Respiratory: Negative for choking and stridor.   Gastrointestinal: Negative for distension or worsening eructation Genitourinary: Negative for retention or change in urine volume.  Musculoskeletal: Negative for other MSK pain or swelling Skin: Negative for color change and worsening wound Neurological: Negative for tremors and numbness other than noted  Psychiatric/Behavioral: Negative for decreased concentration or agitation other than above       Objective:   Physical Exam BP 134/72 mmHg  Pulse 96  Temp(Src) 99 F (37.2 C) (Oral)  Resp 20  Wt 273 lb (123.832 kg)  SpO2 90%  VS noted, mild ill Constitutional: Pt appears in no apparent distress HENT: Head: NCAT.  Right Ear: External ear normal.  Left Ear: External ear normal.  Bilat tm's with mild erythema.  Max sinus areas mild tender.  Pharynx with mild erythema, no exudate Eyes: . Pupils are equal, round, and reactive to light. Conjunctivae and EOM are normal Neck: Normal range of motion. Neck supple.  Cardiovascular: Normal rate and regular rhythm.   Pulmonary/Chest: Effort normal and breath sounds decreased without rales or wheezing.  Neurological: Pt is alert. Not confused , motor grossly intact Skin: Skin is warm. No rash, no LE  edema Psychiatric: Pt behavior is normal. No agitation.     Assessment & Plan:

## 2015-07-10 NOTE — Patient Instructions (Addendum)
You had the steroid shot today  Please take all new medication as prescribed - the antibiotic  Please continue all other medications as before, and refills have been done if requested.  Please have the pharmacy call with any other refills you may need.  Please continue your efforts at being more active, low cholesterol diet, and weight control.  Please keep your appointments with your specialists as you may have planned  We will let Jenny Reichmann B know about your missed appt yesterday and when to follow up with the INR

## 2015-07-16 ENCOUNTER — Ambulatory Visit (INDEPENDENT_AMBULATORY_CARE_PROVIDER_SITE_OTHER): Payer: BLUE CROSS/BLUE SHIELD | Admitting: General Practice

## 2015-07-16 DIAGNOSIS — I4892 Unspecified atrial flutter: Secondary | ICD-10-CM | POA: Diagnosis not present

## 2015-07-16 DIAGNOSIS — I4891 Unspecified atrial fibrillation: Secondary | ICD-10-CM

## 2015-07-16 DIAGNOSIS — Z7901 Long term (current) use of anticoagulants: Secondary | ICD-10-CM

## 2015-07-16 DIAGNOSIS — Z86718 Personal history of other venous thrombosis and embolism: Secondary | ICD-10-CM

## 2015-07-16 DIAGNOSIS — I480 Paroxysmal atrial fibrillation: Secondary | ICD-10-CM

## 2015-07-16 DIAGNOSIS — Z8672 Personal history of thrombophlebitis: Secondary | ICD-10-CM | POA: Diagnosis not present

## 2015-07-16 DIAGNOSIS — Z5181 Encounter for therapeutic drug level monitoring: Secondary | ICD-10-CM

## 2015-07-16 LAB — POCT INR: INR: 1.3

## 2015-07-16 NOTE — Progress Notes (Signed)
Pre visit review using our clinic review tool, if applicable. No additional management support is needed unless otherwise documented below in the visit note. 

## 2015-07-24 ENCOUNTER — Other Ambulatory Visit: Payer: Self-pay

## 2015-07-24 MED ORDER — DILTIAZEM HCL ER COATED BEADS 360 MG PO CP24: 360.0000 mg | ORAL_CAPSULE | Freq: Every day | ORAL | Status: DC

## 2015-07-30 NOTE — Progress Notes (Signed)
HPI: FU AFib/flutter, prior pulmonary embolism, chronic coumadin, diastolic dysfunction, morbid obesity, COPD, LE edema. Cardiac cath in 2003 was normal. Echo in 11/13 demonstrated normal LVF. Nuclear study March 2015 showed an ejection fraction 61% and normal perfusion. Seen November 2016 by Dr. Martinique and noted to be in atrial flutter. Subsequently had TEE cardioversion 11/16; TEE revealed normal LV function; mild MR; mild biatrial enlargement. Since she was last seen She has some dyspnea on exertion but at baseline. No orthopnea, PND, pedal edema, chest pain or syncope. Occasional palpitations.  Current Outpatient Prescriptions  Medication Sig Dispense Refill  . Aclidinium Bromide (TUDORZA PRESSAIR) 400 MCG/ACT AEPB Inhale 1 puff into the lungs 2 (two) times daily. 3 each 1  . albuterol (PROVENTIL HFA;VENTOLIN HFA) 108 (90 Base) MCG/ACT inhaler Inhale 2 puffs into the lungs every 4 (four) hours as needed for wheezing or shortness of breath.    Marland Kitchen atorvastatin (LIPITOR) 10 MG tablet TAKE 1 TABLET DAILY 90 tablet 1  . budesonide-formoterol (SYMBICORT) 160-4.5 MCG/ACT inhaler Inhale 2 puffs into the lungs 2 (two) times daily.    . Calcium Carbonate-Vitamin D 600-400 MG-UNIT per tablet Take 1 tablet by mouth 2 (two) times daily.      . cetirizine (ZYRTEC) 10 MG tablet Take 10 mg by mouth at bedtime as needed for allergies.     . cholecalciferol (VITAMIN D) 1000 UNITS tablet Take 1,000 Units by mouth every morning.    Marland Kitchen Dextromethorphan-Guaifenesin (MUCINEX DM MAXIMUM STRENGTH) 60-1200 MG TB12 Take 1 tablet by mouth every 12 (twelve) hours as needed (cough/congestion).    Marland Kitchen diltiazem (CARDIZEM CD) 360 MG 24 hr capsule Take 1 capsule (360 mg total) by mouth daily. 90 capsule 1  . FLUoxetine (PROZAC) 40 MG capsule Take 40 mg by mouth daily.    . furosemide (LASIX) 80 MG tablet Take one and half (1.5) tablet (120 mg total) by mouth every morning and take one (1) tablet (80 mg total) by mouth in the  afternoon.    Marland Kitchen glucosamine-chondroitin 500-400 MG tablet Take 1 tablet by mouth every morning.     Marland Kitchen HYDROcodone-acetaminophen (NORCO) 5-325 MG tablet Take 1-2 tablets by mouth every 6 (six) hours as needed for moderate pain or severe pain. 30 tablet 0  . HYDROcodone-homatropine (HYCODAN) 5-1.5 MG/5ML syrup Take 5 mLs by mouth every 6 (six) hours as needed for cough. 180 mL 0  . KLOR-CON M20 20 MEQ tablet Take 1 tablet (20 mEq total) by mouth 2 (two) times daily. 180 tablet 2  . levalbuterol (XOPENEX) 1.25 MG/3ML nebulizer solution Take 1.25 mg by nebulization every 4 (four) hours as needed for wheezing or shortness of breath.    . levothyroxine (SYNTHROID, LEVOTHROID) 25 MCG tablet Take 1 tablet (25 mcg total) by mouth daily before breakfast. 90 tablet 2  . mometasone (NASONEX) 50 MCG/ACT nasal spray Place 2 sprays into the nose 2 (two) times daily as needed (For nasal congestion.).     Marland Kitchen omeprazole (PRILOSEC) 20 MG capsule Take 40 mg by mouth daily.    . propafenone (RYTHMOL SR) 325 MG 12 hr capsule TAKE 1 CAPSULE TWICE A DAY 180 capsule 1  . rivaroxaban (XARELTO) 10 MG TABS tablet Take 10 mg by mouth daily. 5/20-5/22/17 and resume as directed per dr after surgery    . sodium chloride (OCEAN) 0.65 % SOLN nasal spray Place 2 sprays into both nostrils 2 (two) times daily as needed for congestion.     Marland Kitchen warfarin (  COUMADIN) 7.5 MG tablet TAKE AS DIRECTED BY COUMADIN CLINIC (Patient taking differently: Take 7.5-11.25 mg by mouth daily at 6 PM. TAKE AS DIRECTED BY COUMADIN CLINIC Pt to take 1.5 Tablets (11.25mg ) on Wednesday and Saturday, 1 tablet (7.5) all other days) 120 tablet 1   No current facility-administered medications for this visit.     Past Medical History  Diagnosis Date  . Chronic rhinitis   . Morbid obesity ([place])     target wt=179lb for BMI<30 Peak wt 282lb  . History of DVT (deep vein thrombosis)   . COPD (chronic obstructive pulmonary disease) ([place])     [place]ert. PFTs 07/25/06 FEV1  55% ratio 53, DLC0 51% HFA 50% 12/16/2009  . Depression   . Osteoporosis     last BMD 4/08 -2.4, intolerant of bisphosphonates  . Hypertension   . Atrial fibrillation and flutter ([place])   . Anxiety   . Glucose intolerance (impaired glucose tolerance)     on steroids  . VITAMIN D DEFICIENCY   . HYPERLIPIDEMIA   . PREMATURE VENTRICULAR CONTRACTIONS   . Rhabdomyolysis 07/29/2009  . FRACTURE, RIB, RIGHT 06/18/2009  . PULMONARY EMBOLISM, HX OF 06/19/2007  . Tremor   . Hx of cardiac catheterization     LHC (01/2001):  Normal Cors.  EF 65%.;  LexiScan Myoview (03/2013): No ischemia, EF 61%, normal  . Hx of echocardiogram     Echo (11/2011):  Mod LVH, EF 60-65%, no RWMA, MAC, mild LAE, PASP 34.  . Asthma   . CHF (congestive heart failure) ([place])   . Atypical lobular hyperplasia of right breast 05/09/2015  . Complication of anesthesia   . PONV (postoperative nausea and vomiting)   . Peripheral vascular disease (H[facility])     hx dvt.pe  . Heart murmur   . Shortness of breath dyspnea   . Pneumonia     hx  . Hypothyroidism   . Arthritis   . Breast CA ([place])     ?  Marland Kitchen Anemia     hx    Past Surgical History  Procedure Laterality Date  . Abdominal hysterectomy    . Tonsillectomy    . Skin graft to middle r finger  1975  . S/p left arm fracture with fall off chair    . Colonoscopy  10/22/2003, ?2006  . Cardioversion N/A 12/11/2014    Procedure: CARDIOVERSION;  Surgeon: Josue Hector, MD;  Location: [facility] ENDOSCOPY;  Service: Cardiovascular;  Laterality: N/A;  . Tee without cardioversion N/A 12/11/2014    Procedure: TRANSESOPHAGEAL ECHOCARDIOGRAM (TEE);  Surgeon: Josue Hector, MD;  Location: [facility] ENDOSCOPY;  Service: Cardiovascular;  Laterality: N/A;  . Breast surgery Left     s/p mass removal  . Breast lumpectomy with radioactive seed localization Right 06/05/2015    Procedure: RIGHT BREAST LUMPECTOMY WITH RADIOACTIVE SEED LOCALIZATION;  Surgeon: Fanny Skates, MD;  Location: [place];  Service:  General;  Laterality: Right;    Social History   Social History  . Marital Status: Married    Spouse Name: N/A  . Number of Children: 3  . Years of Education: N/A   Occupational History  . 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 Smoking status: Former Smoker -- 1.00 packs/day for 35 years    Types: Cigarettes    Quit date: 01/12/1996  . Smokeless tobacco: Never Used  . Alcohol Use: No  . Drug Use: No  . Sexual Activity: Not on file  Other Topics Concern  . Not on file   Social History Narrative    Family History  Problem Relation Age of Onset  . Heart disease Mother   . Asthma Brother   . Throat cancer Brother   . Asthma Son     ROS: no fevers or chills, productive cough, hemoptysis, dysphasia, odynophagia, melena, hematochezia, dysuria, hematuria, rash, seizure activity, orthopnea, PND, pedal edema, claudication. Remaining systems are negative.  Physical Exam: Well-developed obese in no acute distress.  Skin is warm and dry.  HEENT is normal.  Neck is supple.  Chest with diminished BS throughout Cardiovascular exam is regular rate and rhythm.  Abdominal exam nontender or distended. No masses palpated. Extremities show no edema. neuro grossly intact   A/P  1 Chronic diastolic congestive heart failure-continue present dose of diuretic. Patient appears to be euvolemic. Check K and renal function.   2 paroxysmal atrial fibrillation-patient remains in sinus rhythm in exam. Continue propafenone, Cardizem and Coumadin. Patient provided with name of DOACS; she will contact us if affordable and we will transition from coumadin.  3 hypertension-blood pressure control. Continue present medications.  4 hyperlipidemia continue statin.  5 obesity-we discussed the importance of weight loss.  Kirk Ruths, MD

## 2015-07-31 ENCOUNTER — Encounter: Payer: Self-pay | Admitting: Internal Medicine

## 2015-07-31 ENCOUNTER — Ambulatory Visit (INDEPENDENT_AMBULATORY_CARE_PROVIDER_SITE_OTHER): Payer: BLUE CROSS/BLUE SHIELD | Admitting: Internal Medicine

## 2015-07-31 DIAGNOSIS — J309 Allergic rhinitis, unspecified: Secondary | ICD-10-CM | POA: Diagnosis not present

## 2015-07-31 DIAGNOSIS — I5032 Chronic diastolic (congestive) heart failure: Secondary | ICD-10-CM | POA: Diagnosis not present

## 2015-07-31 DIAGNOSIS — J438 Other emphysema: Secondary | ICD-10-CM

## 2015-07-31 MED ORDER — HYDROCODONE-HOMATROPINE 5-1.5 MG/5ML PO SYRP: 5.0000 mL | ORAL_SOLUTION | Freq: Four times a day (QID) | ORAL | Status: DC | PRN

## 2015-07-31 NOTE — Patient Instructions (Signed)
You had the steroid shot today  OK to increase the lasix to 120 mg twice per day for 3 days, then resume the prior dosing  Please take all new medication as prescribed - the cough medicine  Please continue all other medications as before, and refills have been done if requested.  Please have the pharmacy call with any other refills you may need.  Please keep your appointments with your specialists as you may have planned

## 2015-07-31 NOTE — Progress Notes (Signed)
Pre visit review using our clinic review tool, if applicable. No additional management support is needed unless otherwise documented below in the visit note. 

## 2015-08-01 ENCOUNTER — Ambulatory Visit (INDEPENDENT_AMBULATORY_CARE_PROVIDER_SITE_OTHER): Payer: BLUE CROSS/BLUE SHIELD | Admitting: Cardiology

## 2015-08-01 ENCOUNTER — Encounter: Payer: Self-pay | Admitting: Cardiology

## 2015-08-01 VITALS — BP 136/62 | HR 76 | Ht 64.0 in | Wt 274.2 lb

## 2015-08-01 DIAGNOSIS — I10 Essential (primary) hypertension: Secondary | ICD-10-CM

## 2015-08-01 DIAGNOSIS — I4891 Unspecified atrial fibrillation: Secondary | ICD-10-CM

## 2015-08-01 DIAGNOSIS — I480 Paroxysmal atrial fibrillation: Secondary | ICD-10-CM | POA: Diagnosis not present

## 2015-08-01 DIAGNOSIS — E785 Hyperlipidemia, unspecified: Secondary | ICD-10-CM | POA: Diagnosis not present

## 2015-08-01 DIAGNOSIS — I5032 Chronic diastolic (congestive) heart failure: Secondary | ICD-10-CM

## 2015-08-01 LAB — BASIC METABOLIC PANEL
BUN: 9 mg/dL (ref 7–25)
CO2: 34 mmol/L — ABNORMAL HIGH (ref 20–31)
Calcium: 8.5 mg/dL — ABNORMAL LOW (ref 8.6–10.4)
Chloride: 101 mmol/L (ref 98–110)
Creat: 0.72 mg/dL (ref 0.50–0.99)
Glucose, Bld: 131 mg/dL — ABNORMAL HIGH (ref 65–99)
Potassium: 4 mmol/L (ref 3.5–5.3)
Sodium: 144 mmol/L (ref 135–146)

## 2015-08-01 NOTE — Patient Instructions (Signed)
Medication Instructions:   XARELTO OR PRADAXA OR ELIQUIS TO REPLACE WARFARIN  Labwork:  Your physician recommends that you HAVE LAB WORK TODAY  Follow-Up:  Your physician wants you to follow-up in: Why will receive a reminder letter in the mail two months in advance. If you don't receive a letter, please call our office to schedule the follow-up appointment.   If you need a refill on your cardiac medications before your next appointment, please call your pharmacy.

## 2015-08-03 NOTE — Assessment & Plan Note (Signed)
Mild to mod, c/w seasonal flare, for depomedrol IM,,  to f/u any worsening symptoms or concerns

## 2015-08-03 NOTE — Assessment & Plan Note (Addendum)
stable overall by history and exam, recent data reviewed with pt, and pt to continue medical treatment as before,  to f/u any worsening symptoms or concerns @LASTSAO2(3)@  

## 2015-08-03 NOTE — Progress Notes (Signed)
Subjective:    Patient ID: [name] [name], female    DOB: [birth year], 65 y.o.   MRN: [id number]  HPI  Here with CC of cough, nonprod but just miserable as this persists for 1 wk, cant sleep, without fever and Pt denies chest pain, increased sob or doe, wheezing, orthopnea, PND, palpitations, dizziness or syncope, but is assoc with wt gain and leg swelling and puffiness all over.  Does also have several wks ongoing nasal allergy symptoms with clearish congestion, itch and sneezing, without fever, pain, ST, or wheezing.   Past Medical History:  Diagnosis Date  . Anemia    hx  . Anxiety   . Arthritis   . Asthma   . Atrial fibrillation and flutter ([place])   . Atypical lobular hyperplasia of right breast 05/09/2015  . Breast CA ([place])    ?  Marland Kitchen CHF (congestive heart failure) ([place])   . Chronic rhinitis   . Complication of anesthesia   . COPD (chronic obstructive pulmonary disease) ([place])    Wert. PFTs 07/25/06 FEV1 55% ratio 53, DLC0 51% HFA 50% 12/16/2009  . Depression   . FRACTURE, RIB, RIGHT 06/18/2009  . Glucose intolerance (impaired glucose tolerance)    on steroids  . Heart murmur   . History of DVT (deep vein thrombosis)   . Hx of cardiac catheterization    LHC (01/2001):  Normal Cors.  EF 65%.;  LexiScan Myoview (03/2013): No ischemia, EF 61%, normal  . Hx of echocardiogram    Echo (11/2011):  Mod LVH, EF 60-65%, no RWMA, MAC, mild LAE, PASP 34.  Marland Kitchen HYPERLIPIDEMIA   . Hypertension   . Hypothyroidism   . Morbid obesity ([place])    target wt=179lb for BMI<30 Peak wt 282lb  . Osteoporosis    last BMD 4/08 -2.4, intolerant of bisphosphonates  . Peripheral vascular disease (H[facility])    hx dvt.pe  . Pneumonia    hx  . PONV (postoperative nausea and vomiting)   . PREMATURE VENTRICULAR CONTRACTIONS   . PULMONARY EMBOLISM, HX OF 06/19/2007  . Rhabdomyolysis 07/29/2009  . Shortness of breath dyspnea   . Tremor   . VITAMIN D DEFICIENCY    Past Surgical History:  Procedure Laterality Date  .  ABDOMINAL HYSTERECTOMY    . BREAST LUMPECTOMY WITH RADIOACTIVE SEED LOCALIZATION Right 06/05/2015   Procedure: RIGHT BREAST LUMPECTOMY WITH RADIOACTIVE SEED LOCALIZATION;  Surgeon: Fanny Skates, MD;  Location: [place];  Service: General;  Laterality: Right;  . BREAST SURGERY Left    s/p mass removal  . CARDIOVERSION N/A 12/11/2014   Procedure: CARDIOVERSION;  Surgeon: Josue Hector, MD;  Location: [facility] ENDOSCOPY;  Service: Cardiovascular;  Laterality: N/A;  . COLONOSCOPY  10/22/2003, ?2006  . s/p left arm fracture with fall off chair    . skin graft to middle R finger  1975  . TEE WITHOUT CARDIOVERSION N/A 12/11/2014   Procedure: TRANSESOPHAGEAL ECHOCARDIOGRAM (TEE);  Surgeon: Josue Hector, MD;  Location: [facility] ENDOSCOPY;  Service: Cardiovascular;  Laterality: N/A;  . TONSILLECTOMY      reports that she quit smoking about 19 years ago. Her smoking use included Cigarettes. She has a 35.00 pack-year smoking history. She has never used smokeless tobacco. She reports that she does not drink alcohol or use drugs. family history includes Asthma in her brother and son; Heart disease in her mother; Throat cancer in her brother. Allergies  Allergen Reactions  . Duloxetine Other (See Comments)    REACTION: rhabdomyolysis  .  Penicillins Other (See Comments)    SYNCOPE  . Latex Rash   Current Outpatient Prescriptions on File Prior to Visit  Medication Sig Dispense Refill  . Aclidinium Bromide (TUDORZA PRESSAIR) 400 MCG/ACT AEPB Inhale 1 puff into the lungs 2 (two) times daily. 3 each 1  . albuterol (PROVENTIL HFA;VENTOLIN HFA) 108 (90 Base) MCG/ACT inhaler Inhale 2 puffs into the lungs every 4 (four) hours as needed for wheezing or shortness of breath.    Marland Kitchen atorvastatin (LIPITOR) 10 MG tablet TAKE 1 TABLET DAILY 90 tablet 1  . budesonide-formoterol (SYMBICORT) 160-4.5 MCG/ACT inhaler Inhale 2 puffs into the lungs 2 (two) times daily.    . Calcium Carbonate-Vitamin D 600-400 MG-UNIT per tablet Take 1  tablet by mouth 2 (two) times daily.      . cetirizine (ZYRTEC) 10 MG tablet Take 10 mg by mouth at bedtime as needed for allergies.     . cholecalciferol (VITAMIN D) 1000 UNITS tablet Take 1,000 Units by mouth every morning.    Marland Kitchen Dextromethorphan-Guaifenesin (MUCINEX DM MAXIMUM STRENGTH) 60-1200 MG TB12 Take 1 tablet by mouth every 12 (twelve) hours as needed (cough/congestion).    Marland Kitchen diltiazem (CARDIZEM CD) 360 MG 24 hr capsule Take 1 capsule (360 mg total) by mouth daily. 90 capsule 1  . FLUoxetine (PROZAC) 40 MG capsule Take 40 mg by mouth daily.    . furosemide (LASIX) 80 MG tablet Take one and half (1.5) tablet (120 mg total) by mouth every morning and take one (1) tablet (80 mg total) by mouth in the afternoon.    Marland Kitchen glucosamine-chondroitin 500-400 MG tablet Take 1 tablet by mouth every morning.     Marland Kitchen HYDROcodone-acetaminophen (NORCO) 5-325 MG tablet Take 1-2 tablets by mouth every 6 (six) hours as needed for moderate pain or severe pain. 30 tablet 0  . KLOR-CON M20 20 MEQ tablet Take 1 tablet (20 mEq total) by mouth 2 (two) times daily. 180 tablet 2  . levalbuterol (XOPENEX) 1.25 MG/3ML nebulizer solution Take 1.25 mg by nebulization every 4 (four) hours as needed for wheezing or shortness of breath.    . levothyroxine (SYNTHROID, LEVOTHROID) 25 MCG tablet Take 1 tablet (25 mcg total) by mouth daily before breakfast. 90 tablet 2  . mometasone (NASONEX) 50 MCG/ACT nasal spray Place 2 sprays into the nose 2 (two) times daily as needed (For nasal congestion.).     Marland Kitchen omeprazole (PRILOSEC) 20 MG capsule Take 40 mg by mouth daily.    . propafenone (RYTHMOL SR) 325 MG 12 hr capsule TAKE 1 CAPSULE TWICE A DAY 180 capsule 1  . sodium chloride (OCEAN) 0.65 % SOLN nasal spray Place 2 sprays into both nostrils 2 (two) times daily as needed for congestion.     Marland Kitchen warfarin (COUMADIN) 7.5 MG tablet TAKE AS DIRECTED BY COUMADIN CLINIC (Patient taking differently: Take 7.5-11.25 mg by mouth daily at 6 PM. TAKE  AS DIRECTED BY COUMADIN CLINIC Pt to take 1.5 Tablets (11.25mg ) on Wednesday and Saturday, 1 tablet (7.5) all other days) 120 tablet 1   No current facility-administered medications on file prior to visit.     Review of Systems  Constitutional: Negative for unusual diaphoresis or night sweats HENT: Negative for ear swelling or discharge Eyes: Negative for worsening visual haziness  Respiratory: Negative for choking and stridor.   Gastrointestinal: Negative for distension or worsening eructation Genitourinary: Negative for retention or change in urine volume.  Musculoskeletal: Negative for other MSK pain or swelling Skin: Negative for color  change and worsening wound Neurological: Negative for tremors and numbness other than noted  Psychiatric/Behavioral: Negative for decreased concentration or agitation other than above       Objective:   Physical Exam BP 136/74   Pulse 80   Temp 98 F (36.7 C) (Oral)   Resp 20   Wt 276 lb (125.2 kg)   SpO2 93%   BMI 48.89 kg/m  VS noted,  Constitutional: Pt appears in no apparent distress HENT: Head: NCAT.  Right Ear: External ear normal.  Left Ear: External ear normal.  Bilat tm's with mild erythema.  Max sinus areas mild tender.  Pharynx with mild erythema, no exudate Eyes: . Pupils are equal, round, and reactive to light. Conjunctivae and EOM are normal Neck: Normal range of motion. Neck supple.  Cardiovascular: Normal rate and regular rhythm.   Pulmonary/Chest: Effort normal and breath sounds decreased with few basilar rales, no wheezing.  Neurological: Pt is alert. Not confused , motor grossly intact Skin: Skin is warm. No rash, no LE edema Psychiatric: Pt behavior is normal. No agitation.     Assessment & Plan:

## 2015-08-03 NOTE — Assessment & Plan Note (Signed)
With mild worsening symptoms and exam, for incresaed lasix to 120 bid for 3 days, then resume prior dosing

## 2015-08-04 ENCOUNTER — Telehealth: Payer: Self-pay | Admitting: Cardiology

## 2015-08-04 ENCOUNTER — Encounter: Payer: Self-pay | Admitting: *Deleted

## 2015-08-04 NOTE — Telephone Encounter (Signed)
Spoke with pt, aware will send to dr Stanford Breed for dosing and then send to insurance. Explained she will probably need prior authorization, but I will take care of that.

## 2015-08-04 NOTE — Telephone Encounter (Signed)
Based on weight, Scr, and age she would need Eliquis 5mg  BID. She should begin this once INR less than 2.

## 2015-08-04 NOTE — Telephone Encounter (Signed)
New message       Pt was seen last Friday.  She want to switch from warfarin to eliquis.  This was discussed at the visit.  Can she start eliquis immediately or will she need to taper off of it?  She already checked to see if she can afford it and she can.  Please call

## 2015-08-04 NOTE — Telephone Encounter (Signed)
Agree with plan Brian Crenshaw  

## 2015-08-04 NOTE — Telephone Encounter (Signed)
This encounter was created in error - please disregard.

## 2015-08-05 NOTE — Telephone Encounter (Signed)
Spoke with pt, Aware of dr Jacalyn Lefevre recommendations. She reports her bp today is 129/74 and pulse is 95. Will forward for dr Stanford Breed review

## 2015-08-06 MED ORDER — APIXABAN 5 MG PO TABS: 5.0000 mg | ORAL_TABLET | Freq: Two times a day (BID) | ORAL | 3 refills | Status: DC

## 2015-08-06 NOTE — Telephone Encounter (Signed)
Spoke with pt, she will cancel her INR check for Tuesday next week. [facility] eliqu[facility] The pt will call once she has the eliquis in hand so we can get an INR.

## 2015-08-06 NOTE — Telephone Encounter (Signed)
[name] [name] is calling to find out if the Dr. Stanford Breed says what will be the mgs for the Elquis . Please call   Thanks

## 2015-08-13 ENCOUNTER — Telehealth: Payer: Self-pay | Admitting: Cardiology

## 2015-08-13 ENCOUNTER — Ambulatory Visit: Payer: BLUE CROSS/BLUE SHIELD

## 2015-08-13 NOTE — Telephone Encounter (Signed)
Spoke with pt, she has received her eliquis. Appointment made for pt to see the [facility] clinic tomorrow to switch from warfarin to eliquis.

## 2015-08-13 NOTE — Telephone Encounter (Signed)
New message      Pt c/o medication issue:  1. Name of Medication: Eliquis  2. How are you currently taking this medication (dosage and times per day)? 5 mg po taking 2 daily  3. Are you having a reaction (difficulty breathing--STAT)? no  4. What is your medication issue? The pt was instructed to come in for blood before starting the medication, there is not order for blood work    The pt asked if you would call after 12 today she has a dentist appointment

## 2015-08-14 ENCOUNTER — Ambulatory Visit (INDEPENDENT_AMBULATORY_CARE_PROVIDER_SITE_OTHER): Payer: BLUE CROSS/BLUE SHIELD | Admitting: Pharmacist Clinician (PhC)/ Clinical Pharmacy Specialist

## 2015-08-14 DIAGNOSIS — I4892 Unspecified atrial flutter: Secondary | ICD-10-CM

## 2015-08-14 DIAGNOSIS — Z8672 Personal history of thrombophlebitis: Secondary | ICD-10-CM

## 2015-08-14 DIAGNOSIS — I480 Paroxysmal atrial fibrillation: Secondary | ICD-10-CM | POA: Diagnosis not present

## 2015-08-14 DIAGNOSIS — Z86718 Personal history of other venous thrombosis and embolism: Secondary | ICD-10-CM

## 2015-08-14 DIAGNOSIS — Z5181 Encounter for therapeutic drug level monitoring: Secondary | ICD-10-CM

## 2015-08-14 LAB — POCT INR: INR: 1.6

## 2015-08-20 ENCOUNTER — Other Ambulatory Visit: Payer: Self-pay | Admitting: *Deleted

## 2015-08-20 ENCOUNTER — Other Ambulatory Visit: Payer: Self-pay

## 2015-08-20 MED ORDER — DILTIAZEM HCL ER COATED BEADS 360 MG PO CP24: 360.0000 mg | ORAL_CAPSULE | Freq: Every day | ORAL | 1 refills | Status: DC

## 2015-08-28 ENCOUNTER — Ambulatory Visit: Payer: BLUE CROSS/BLUE SHIELD | Admitting: Nurse Practitioner

## 2015-08-29 ENCOUNTER — Encounter: Payer: Self-pay | Admitting: Internal Medicine

## 2015-08-29 ENCOUNTER — Ambulatory Visit (INDEPENDENT_AMBULATORY_CARE_PROVIDER_SITE_OTHER): Payer: BLUE CROSS/BLUE SHIELD | Admitting: Internal Medicine

## 2015-08-29 VITALS — BP 140/80 | HR 94 | Temp 99.0°F | Resp 20 | Wt 276.0 lb

## 2015-08-29 DIAGNOSIS — F329 Major depressive disorder, single episode, unspecified: Secondary | ICD-10-CM

## 2015-08-29 DIAGNOSIS — F32A Depression, unspecified: Secondary | ICD-10-CM

## 2015-08-29 DIAGNOSIS — Z13820 Encounter for screening for osteoporosis: Secondary | ICD-10-CM

## 2015-08-29 DIAGNOSIS — J329 Chronic sinusitis, unspecified: Secondary | ICD-10-CM | POA: Diagnosis not present

## 2015-08-29 DIAGNOSIS — J438 Other emphysema: Secondary | ICD-10-CM | POA: Diagnosis not present

## 2015-08-29 DIAGNOSIS — I10 Essential (primary) hypertension: Secondary | ICD-10-CM | POA: Diagnosis not present

## 2015-08-29 MED ORDER — LEVOFLOXACIN 500 MG PO TABS: 500.0000 mg | ORAL_TABLET | Freq: Every day | ORAL | 0 refills | Status: AC

## 2015-08-29 MED ORDER — PROMETHAZINE-CODEINE 6.25-10 MG/5ML PO SYRP: 5.0000 mL | ORAL_SOLUTION | ORAL | 0 refills | Status: AC | PRN

## 2015-08-29 NOTE — Progress Notes (Signed)
Pre visit review using our clinic review tool, if applicable. No additional management support is needed unless otherwise documented below in the visit note. 

## 2015-08-29 NOTE — Patient Instructions (Signed)
Please take all new medication as prescribed - the antibiotic (sent to pharmacy), and the cough medicine  Please continue all other medications as before, and refills have been done if requested.  Please have the pharmacy call with any other refills you may need.  Please keep your appointments with your specialists as you may have planned  Please schedule the bone density test before leaving today at the scheduling desk (where you check out)  You will be contacted regarding the referral for: ENT

## 2015-08-30 DIAGNOSIS — J329 Chronic sinusitis, unspecified: Secondary | ICD-10-CM | POA: Insufficient documentation

## 2015-08-30 NOTE — Assessment & Plan Note (Signed)
Mild to mod, for antibx course,  to f/u any worsening symptoms or concerns, consider ENT referral

## 2015-08-30 NOTE — Assessment & Plan Note (Signed)
stable overall by history and exam, recent data reviewed with pt, and pt to continue medical treatment as before,  to f/u any worsening symptoms or concerns @LASTSAO2(3)@  

## 2015-08-30 NOTE — Progress Notes (Signed)
Subjective:    Patient ID: [name] [name], female    DOB: [birth year], 65 y.o.   MRN: [id number]  HPI   Here with 2-3 days acute onset fever, facial pain, pressure, headache, general weakness and malaise, and greenish d/c, with mild ST and cough, but pt denies chest pain, wheezing, increased sob or doe, orthopnea, PND, increased LE swelling, palpitations, dizziness or syncope. Due for dxa f/u.  Pt denies new neurological symptoms such as new headache, or facial or extremity weakness or numbness   Pt denies polydipsia, polyuria  Denies worsening depressive symptoms, suicidal ideation, or panic Past Medical History:  Diagnosis Date  . Anemia    hx  . Anxiety   . Arthritis   . Asthma   . Atrial fibrillation and flutter ([place])   . Atypical lobular hyperplasia of right breast 05/09/2015  . Breast CA ([place])    ?  Marland Kitchen CHF (congestive heart failure) ([place])   . Chronic rhinitis   . Complication of anesthesia   . COPD (chronic obstructive pulmonary disease) ([place])    [facility]. PFTs 07/25/06 FEV1 55% ratio 53, DLC0 51% HFA 50% 12/16/2009  . Depression   . FRACTURE, RIB, RIGHT 06/18/2009  . Glucose intolerance (impaired glucose tolerance)    on steroids  . Heart murmur   . History of DVT (deep vein thrombosis)   . Hx of cardiac catheterization    LHC (01/2001):  Normal Cors.  EF 65%.;  LexiScan Myoview (03/2013): No ischemia, EF 61%, normal  . Hx of echocardiogram    Echo (11/2011):  Mod LVH, EF 60-65%, no RWMA, MAC, mild LAE, PASP 34.  Marland Kitchen HYPERLIPIDEMIA   . Hypertension   . Hypothyroidism   . Morbid obesity ([place])    target wt=179lb for BMI<30 Peak wt 282lb  . Osteoporosis    last BMD 4/08 -2.4, intolerant of bisphosphonates  . Peripheral vascular disease (HCC)    hx dvt.pe  . Pneumonia    hx  . PONV (postoperative nausea and vomiting)   . PREMATURE VENTRICULAR CONTRACTIONS   . PULMONARY EMBOLISM, HX OF 06/19/2007  . Rhabdomyolysis 07/29/2009  . Shortness of breath dyspnea   . Tremor   . VITAMIN D  DEFICIENCY    Past Surgical History:  Procedure Laterality Date  . ABDOMINAL HYSTERECTOMY    . BREAST LUMPECTOMY WITH RADIOACTIVE SEED LOCALIZATION Right 06/05/2015   Procedure: RIGHT BREAST LUMPECTOMY WITH RADIOACTIVE SEED LOCALIZATION;  Surgeon: Fanny Skates, MD;  Location: [facility];  Service: General;  Laterality: Right;  . BREAST SURGERY Left    s/p mass removal  . CARDIOVERSION N/A 12/11/2014   Procedure: CARDIOVERSION;  Surgeon: Josue Hector, MD;  Location: [facility] ENDOSCOPY;  Service: Cardiovascular;  Laterality: N/A;  . COLONOSCOPY  10/22/2003, ?2006  . s/p left arm fracture with fall off chair    . skin graft to middle R finger  1975  . TEE WITHOUT CARDIOVERSION N/A 12/11/2014   Procedure: TRANSESOPHAGEAL ECHOCARDIOGRAM (TEE);  Surgeon: Josue Hector, MD;  Location: [facility] ENDOSCOPY;  Service: Cardiovascular;  Laterality: N/A;  . TONSILLECTOMY      reports that she quit smoking about 19 years ago. Her smoking use included Cigarettes. She has a 35.00 pack-year smoking history. She has never used smokeless tobacco. She reports that she does not drink alcohol or use drugs. family history includes Asthma in her brother and son; Heart disease in her mother; Throat cancer in her brother. Allergies  Allergen Reactions  . Duloxetine Other (See Comments)  REACTION: rhabdomyolysis  . Penicillins Other (See Comments)    SYNCOPE  . Latex Rash   Current Outpatient Prescriptions on File Prior to Visit  Medication Sig Dispense Refill  . Aclidinium Bromide (TUDORZA PRESSAIR) 400 MCG/ACT AEPB Inhale 1 puff into the lungs 2 (two) times daily. 3 each 1  . albuterol (PROVENTIL HFA;VENTOLIN HFA) 108 (90 Base) MCG/ACT inhaler Inhale 2 puffs into the lungs every 4 (four) hours as needed for wheezing or shortness of breath.    Marland Kitchen apixaban (ELIQUIS) 5 MG TABS tablet Take 1 tablet (5 mg total) by mouth 2 (two) times daily. 180 tablet 3  . atorvastatin (LIPITOR) 10 MG tablet TAKE 1 TABLET DAILY 90 tablet 1    . budesonide-formoterol (SYMBICORT) 160-4.5 MCG/ACT inhaler Inhale 2 puffs into the lungs 2 (two) times daily.    . Calcium Carbonate-Vitamin D 600-400 MG-UNIT per tablet Take 1 tablet by mouth 2 (two) times daily.      . cetirizine (ZYRTEC) 10 MG tablet Take 10 mg by mouth at bedtime as needed for allergies.     . cholecalciferol (VITAMIN D) 1000 UNITS tablet Take 1,000 Units by mouth every morning.    Marland Kitchen Dextromethorphan-Guaifenesin (MUCINEX DM MAXIMUM STRENGTH) 60-1200 MG TB12 Take 1 tablet by mouth every 12 (twelve) hours as needed (cough/congestion).    Marland Kitchen diltiazem (CARDIZEM CD) 360 MG 24 hr capsule Take 1 capsule (360 mg total) by mouth daily. 90 capsule 1  . FLUoxetine (PROZAC) 40 MG capsule Take 40 mg by mouth daily.    . furosemide (LASIX) 80 MG tablet Take one and half (1.5) tablet (120 mg total) by mouth every morning and take one (1) tablet (80 mg total) by mouth in the afternoon.    Marland Kitchen glucosamine-chondroitin 500-400 MG tablet Take 1 tablet by mouth every morning.     Marland Kitchen HYDROcodone-acetaminophen (NORCO) 5-325 MG tablet Take 1-2 tablets by mouth every 6 (six) hours as needed for moderate pain or severe pain. 30 tablet 0  . KLOR-CON M20 20 MEQ tablet Take 1 tablet (20 mEq total) by mouth 2 (two) times daily. 180 tablet 2  . levalbuterol (XOPENEX) 1.25 MG/3ML nebulizer solution Take 1.25 mg by nebulization every 4 (four) hours as needed for wheezing or shortness of breath.    . levothyroxine (SYNTHROID, LEVOTHROID) 25 MCG tablet Take 1 tablet (25 mcg total) by mouth daily before breakfast. 90 tablet 2  . mometasone (NASONEX) 50 MCG/ACT nasal spray Place 2 sprays into the nose 2 (two) times daily as needed (For nasal congestion.).     Marland Kitchen omeprazole (PRILOSEC) 20 MG capsule Take 40 mg by mouth daily.    . propafenone (RYTHMOL SR) 325 MG 12 hr capsule TAKE 1 CAPSULE TWICE A DAY 180 capsule 1  . sodium chloride (OCEAN) 0.65 % SOLN nasal spray Place 2 sprays into both nostrils 2 (two) times daily  as needed for congestion.      No current facility-administered medications on file prior to visit.    Review of Systems  Constitutional: Negative for unusual diaphoresis or night sweats HENT: Negative for ear swelling or discharge Eyes: Negative for worsening visual haziness  Respiratory: Negative for choking and stridor.   Gastrointestinal: Negative for distension or worsening eructation Genitourinary: Negative for retention or change in urine volume.  Musculoskeletal: Negative for other MSK pain or swelling Skin: Negative for color change and worsening wound Neurological: Negative for tremors and numbness other than noted  Psychiatric/Behavioral: Negative for decreased concentration or agitation other than  above       Objective:   Physical Exam BP 140/80   Pulse 94   Temp 99 F (37.2 C) (Oral)   Resp 20   Wt 276 lb (125.2 kg)   SpO2 93%   BMI 47.38 kg/m  VS noted, mild ill Constitutional: Pt appears in no apparent distress HENT: Head: NCAT.  Right Ear: External ear normal.  Left Ear: External ear normal.  Bilat tm's with mild erythema.  Max sinus areas mild tender.  Pharynx with mild erythema, no exudate Eyes: . Pupils are equal, round, and reactive to light. Conjunctivae and EOM are normal Neck: Normal range of motion. Neck supple.  Cardiovascular: Normal rate and regular rhythm.   Pulmonary/Chest: Effort normal and breath sounds decreased without rales or wheezing.  Neurological: Pt is alert. Not confused , motor grossly intact Skin: Skin is warm. No rash, no LE edema Psychiatric: Pt behavior is normal. No agitation. ill but not depressed affect    Assessment & Plan:

## 2015-08-30 NOTE — Assessment & Plan Note (Signed)
stable overall by history and exam, recent data reviewed with pt, and pt to continue medical treatment as before,  to f/u any worsening symptoms or concerns BP Readings from Last 3 Encounters:  08/29/15 140/80  08/01/15 136/62  07/31/15 136/74

## 2015-08-30 NOTE — Assessment & Plan Note (Signed)
stable overall by history and exam, recent data reviewed with pt, and pt to continue medical treatment as before,  to f/u any worsening symptoms or concerns Lab Results  Component Value Date   WBC 8.7 06/02/2015   HGB 10.7 (L) 06/02/2015   HCT 34.0 (L) 06/02/2015   PLT 253 06/02/2015   GLUCOSE 131 (H) 08/01/2015   CHOL 148 04/11/2015   TRIG 117.0 04/11/2015   HDL 41.20 04/11/2015   LDLDIRECT 140.2 04/10/2010   LDLCALC 84 04/11/2015   ALT 15 06/02/2015   AST 14 (L) 06/02/2015   NA 144 08/01/2015   K 4.0 08/01/2015   CL 101 08/01/2015   CREATININE 0.72 08/01/2015   BUN 9 08/01/2015   CO2 34 (H) 08/01/2015   TSH 5.05 (H) 04/11/2015   INR 1.6 08/14/2015   HGBA1C 5.4 04/11/2015

## 2015-09-01 ENCOUNTER — Encounter: Payer: Self-pay | Admitting: Internal Medicine

## 2015-09-04 ENCOUNTER — Other Ambulatory Visit: Payer: Self-pay | Admitting: Internal Medicine

## 2015-09-08 ENCOUNTER — Telehealth: Payer: Self-pay | Admitting: Internal Medicine

## 2015-09-08 NOTE — Telephone Encounter (Signed)
Please help, pt stating she forgot to schedule the bone density test before she left on 08/29/15. Please check, not sure if order still good in system (do not see the ending date).

## 2015-09-09 NOTE — Telephone Encounter (Signed)
Order is good  Ok to help pt schedule to have done

## 2015-09-10 ENCOUNTER — Telehealth: Payer: Self-pay

## 2015-09-10 ENCOUNTER — Telehealth: Payer: Self-pay | Admitting: *Deleted

## 2015-09-10 DIAGNOSIS — M810 Age-related osteoporosis without current pathological fracture: Secondary | ICD-10-CM

## 2015-09-10 NOTE — Telephone Encounter (Signed)
Left patient vm to call back to schedule.  °

## 2015-09-10 NOTE — Telephone Encounter (Signed)
called to verify Diltiazem 360 mg which is listed on file for pt, verified and [facility] expressed understanding.

## 2015-09-10 NOTE — Telephone Encounter (Signed)
M 81.0 

## 2015-09-10 NOTE — Addendum Note (Signed)
Addended by: Biagio Borg on: 09/10/2015 06:15 PM   Modules accepted: Orders

## 2015-09-10 NOTE — Telephone Encounter (Signed)
Please advise bone density called and they are needing the diagnostic code changed for patients scan

## 2015-09-12 ENCOUNTER — Other Ambulatory Visit: Payer: BLUE CROSS/BLUE SHIELD

## 2015-09-12 ENCOUNTER — Ambulatory Visit (INDEPENDENT_AMBULATORY_CARE_PROVIDER_SITE_OTHER)
Admission: RE | Admit: 2015-09-12 | Discharge: 2015-09-12 | Disposition: A | Discharge status: Home / Self Care | Payer: BLUE CROSS/BLUE SHIELD | Source: Ambulatory Visit | Attending: Internal Medicine | Admitting: Internal Medicine

## 2015-09-12 DIAGNOSIS — M810 Age-related osteoporosis without current pathological fracture: Secondary | ICD-10-CM | POA: Diagnosis not present

## 2015-09-16 ENCOUNTER — Other Ambulatory Visit: Payer: Self-pay | Admitting: Internal Medicine

## 2015-09-16 DIAGNOSIS — M810 Age-related osteoporosis without current pathological fracture: Secondary | ICD-10-CM | POA: Diagnosis not present

## 2015-09-16 MED ORDER — ALENDRONATE SODIUM 70 MG PO TABS: 70.0000 mg | ORAL_TABLET | ORAL | 3 refills | Status: DC

## 2015-09-17 ENCOUNTER — Encounter: Payer: Self-pay | Admitting: Internal Medicine

## 2015-09-18 ENCOUNTER — Other Ambulatory Visit: Payer: Self-pay | Admitting: Internal Medicine

## 2015-09-18 MED ORDER — MOMETASONE FUROATE 50 MCG/ACT NA SUSP: 2.0000 | Freq: Two times a day (BID) | NASAL | 2 refills | Status: DC | PRN

## 2015-09-18 MED ORDER — FLUOXETINE HCL 40 MG PO CAPS: 40.0000 mg | ORAL_CAPSULE | Freq: Every day | ORAL | 1 refills | Status: DC

## 2015-09-18 MED ORDER — FLUOXETINE HCL 40 MG PO CAPS: 40.0000 mg | ORAL_CAPSULE | Freq: Every day | ORAL | 3 refills | Status: DC

## 2015-09-25 DIAGNOSIS — J324 Chronic pansinusitis: Secondary | ICD-10-CM | POA: Diagnosis not present

## 2015-09-25 DIAGNOSIS — R05 Cough: Secondary | ICD-10-CM | POA: Diagnosis not present

## 2015-10-01 DIAGNOSIS — J329 Chronic sinusitis, unspecified: Secondary | ICD-10-CM | POA: Diagnosis not present

## 2015-10-16 ENCOUNTER — Other Ambulatory Visit: Payer: Self-pay | Admitting: Cardiology

## 2015-10-16 MED ORDER — DILTIAZEM HCL ER COATED BEADS 360 MG PO CP24: 360.0000 mg | ORAL_CAPSULE | Freq: Every day | ORAL | 3 refills | Status: DC

## 2015-10-17 ENCOUNTER — Ambulatory Visit (INDEPENDENT_AMBULATORY_CARE_PROVIDER_SITE_OTHER): Payer: BLUE CROSS/BLUE SHIELD | Admitting: Internal Medicine

## 2015-10-17 ENCOUNTER — Ambulatory Visit (INDEPENDENT_AMBULATORY_CARE_PROVIDER_SITE_OTHER)
Admission: RE | Admit: 2015-10-17 | Discharge: 2015-10-17 | Disposition: A | Discharge status: Home / Self Care | Payer: BLUE CROSS/BLUE SHIELD | Source: Ambulatory Visit | Attending: Internal Medicine | Admitting: Internal Medicine

## 2015-10-17 VITALS — BP 140/80 | HR 100 | Temp 98.4°F | Resp 20 | Wt 268.0 lb

## 2015-10-17 DIAGNOSIS — J438 Other emphysema: Secondary | ICD-10-CM

## 2015-10-17 DIAGNOSIS — Z23 Encounter for immunization: Secondary | ICD-10-CM

## 2015-10-17 DIAGNOSIS — R059 Cough, unspecified: Secondary | ICD-10-CM

## 2015-10-17 DIAGNOSIS — R05 Cough: Secondary | ICD-10-CM

## 2015-10-17 DIAGNOSIS — J309 Allergic rhinitis, unspecified: Secondary | ICD-10-CM

## 2015-10-17 MED ORDER — HYDROCODONE-HOMATROPINE 5-1.5 MG/5ML PO SYRP: 5.0000 mL | ORAL_SOLUTION | Freq: Four times a day (QID) | ORAL | 0 refills | Status: AC | PRN

## 2015-10-17 MED ORDER — PREDNISONE 10 MG PO TABS: ORAL_TABLET | ORAL | 0 refills | Status: DC

## 2015-10-17 MED ORDER — MOMETASONE FUROATE 50 MCG/ACT NA SUSP: 2.0000 | Freq: Two times a day (BID) | NASAL | 5 refills | Status: DC | PRN

## 2015-10-17 MED ORDER — METHYLPREDNISOLONE ACETATE 40 MG/ML IJ SUSP
40.0000 mg | Freq: Once | INTRAMUSCULAR | Status: AC
  Administered 2015-10-17: 40 mg via INTRAMUSCULAR

## 2015-10-17 MED ORDER — METHYLPREDNISOLONE ACETATE 40 MG/ML IJ SUSP
40.0000 mg | Freq: Once | INTRAMUSCULAR | Status: AC
Start: 2015-10-17 — End: 2015-10-17
  Administered 2015-10-17: 40 mg via INTRAMUSCULAR

## 2015-10-17 MED ORDER — CETIRIZINE HCL 10 MG PO TABS: 10.0000 mg | ORAL_TABLET | Freq: Every evening | ORAL | 3 refills | Status: DC | PRN

## 2015-10-17 NOTE — Progress Notes (Signed)
Subjective:    Patient ID: [name] [name], female    DOB: 1950-08-22, 65 y.o.   MRN: [id number]  HPI  Here with c/o 3 wks miserable coughing, mild productive, not getting worse, mostly clearish /yellow but no green, blood, pain or fever, Does have several wks ongoing nasal allergy symptoms with clearish congestion, itch and sneezing, without fever, pain, ST, cough, swelling or wheezing.  Did see ENT recently and found to have minimal sinusitis that did not require antibx. Pt denies new neurological symptoms such as new headache, or facial or extremity weakness or numbness   Pt denies polydipsia, polyuria Past Medical History:  Diagnosis Date  . Anemia    hx  . Anxiety   . Arthritis   . Asthma   . Atrial fibrillation and flutter ([place])   . Atypical lobular hyperplasia of right breast 05/09/2015  . Breast CA ([place])    ?  Marland Kitchen CHF (congestive heart failure) ([place])   . Chronic rhinitis   . Complication of anesthesia   . COPD (chronic obstructive pulmonary disease) ([place])    [facility]. PFTs 07/25/06 FEV1 55% ratio 53, DLC0 51% HFA 50% 12/16/2009  . Depression   . FRACTURE, RIB, RIGHT 06/18/2009  . Glucose intolerance (impaired glucose tolerance)    on steroids  . Heart murmur   . History of DVT (deep vein thrombosis)   . Hx of cardiac catheterization    LHC (01/2001):  Normal Cors.  EF 65%.;  LexiScan Myoview (03/2013): No ischemia, EF 61%, normal  . Hx of echocardiogram    Echo (11/2011):  Mod LVH, EF 60-65%, no RWMA, MAC, mild LAE, PASP 34.  Marland Kitchen HYPERLIPIDEMIA   . Hypertension   . Hypothyroidism   . Morbid obesity ([place])    target wt=179lb for BMI<30 Peak wt 282lb  . Osteoporosis    last BMD 4/08 -2.4, intolerant of bisphosphonates  . Peripheral vascular disease (HCC)    hx dvt.pe  . Pneumonia    hx  . PONV (postoperative nausea and vomiting)   . PREMATURE VENTRICULAR CONTRACTIONS   . PULMONARY EMBOLISM, HX OF 06/19/2007  . Rhabdomyolysis 07/29/2009  . Shortness of breath dyspnea   . Tremor   .  VITAMIN D DEFICIENCY    Past Surgical History:  Procedure Laterality Date  . ABDOMINAL HYSTERECTOMY    . BREAST LUMPECTOMY WITH RADIOACTIVE SEED LOCALIZATION Right 06/05/2015   Procedure: RIGHT BREAST LUMPECTOMY WITH RADIOACTIVE SEED LOCALIZATION;  Surgeon: Fanny Skates, MD;  Location: [facility];  Service: General;  Laterality: Right;  . BREAST SURGERY Left    s/p mass removal  . CARDIOVERSION N/A 12/11/2014   Procedure: CARDIOVERSION;  Surgeon: Josue Hector, MD;  Location: [facility] ENDOSCOPY;  Service: Cardiovascular;  Laterality: N/A;  . COLONOSCOPY  10/22/2003, ?2006  . s/p left arm fracture with fall off chair    . skin graft to middle R finger  1975  . TEE WITHOUT CARDIOVERSION N/A 12/11/2014   Procedure: TRANSESOPHAGEAL ECHOCARDIOGRAM (TEE);  Surgeon: Josue Hector, MD;  Location: [facility] ENDOSCOPY;  Service: Cardiovascular;  Laterality: N/A;  . TONSILLECTOMY      reports that she quit smoking about 19 years ago. Her smoking use included Cigarettes. She has a 35.00 pack-year smoking history. She has never used smokeless tobacco. She reports that she does not drink alcohol or use drugs. family history includes Asthma in her brother and son; Heart disease in her mother; Throat cancer in her brother. Allergies  Allergen Reactions  . Duloxetine  Other (See Comments)    REACTION: rhabdomyolysis  . Penicillins Other (See Comments)    SYNCOPE  . Latex Rash   Current Outpatient Prescriptions on File Prior to Visit  Medication Sig Dispense Refill  . Aclidinium Bromide (TUDORZA PRESSAIR) 400 MCG/ACT AEPB Inhale 1 puff into the lungs 2 (two) times daily. 3 each 1  . albuterol (PROVENTIL HFA;VENTOLIN HFA) 108 (90 Base) MCG/ACT inhaler Inhale 2 puffs into the lungs every 4 (four) hours as needed for wheezing or shortness of breath.    Marland Kitchen alendronate (FOSAMAX) 70 MG tablet Take 1 tablet (70 mg total) by mouth every 7 (seven) days. Take with a full glass of water on an empty stomach. 12 tablet 3  .  apixaban (ELIQUIS) 5 MG TABS tablet Take 1 tablet (5 mg total) by mouth 2 (two) times daily. 180 tablet 3  . atorvastatin (LIPITOR) 10 MG tablet TAKE 1 TABLET DAILY 90 tablet 1  . budesonide-formoterol (SYMBICORT) 160-4.5 MCG/ACT inhaler Inhale 2 puffs into the lungs 2 (two) times daily.    . Calcium Carbonate-Vitamin D 600-400 MG-UNIT per tablet Take 1 tablet by mouth 2 (two) times daily.      . cholecalciferol (VITAMIN D) 1000 UNITS tablet Take 1,000 Units by mouth every morning.    Marland Kitchen Dextromethorphan-Guaifenesin (MUCINEX DM MAXIMUM STRENGTH) 60-1200 MG TB12 Take 1 tablet by mouth every 12 (twelve) hours as needed (cough/congestion).    Marland Kitchen diltiazem (CARDIZEM CD) 360 MG 24 hr capsule Take 1 capsule (360 mg total) by mouth daily. 90 capsule 3  . FLUoxetine (PROZAC) 40 MG capsule Take 40 mg by mouth daily.    Marland Kitchen FLUoxetine (PROZAC) 40 MG capsule Take 1 capsule (40 mg total) by mouth daily. 90 capsule 3  . furosemide (LASIX) 80 MG tablet Take one and half (1.5) tablet (120 mg total) by mouth every morning and take one (1) tablet (80 mg total) by mouth in the afternoon.    Marland Kitchen glucosamine-chondroitin 500-400 MG tablet Take 1 tablet by mouth every morning.     Marland Kitchen HYDROcodone-acetaminophen (NORCO) 5-325 MG tablet Take 1-2 tablets by mouth every 6 (six) hours as needed for moderate pain or severe pain. 30 tablet 0  . KLOR-CON M20 20 MEQ tablet Take 1 tablet (20 mEq total) by mouth 2 (two) times daily. 180 tablet 2  . levalbuterol (XOPENEX) 1.25 MG/3ML nebulizer solution Take 1.25 mg by nebulization every 4 (four) hours as needed for wheezing or shortness of breath.    . levothyroxine (SYNTHROID, LEVOTHROID) 25 MCG tablet Take 1 tablet (25 mcg total) by mouth daily before breakfast. 90 tablet 2  . omeprazole (PRILOSEC) 20 MG capsule Take 40 mg by mouth daily.    . propafenone (RYTHMOL SR) 325 MG 12 hr capsule TAKE 1 CAPSULE TWICE A DAY 180 capsule 1  . sodium chloride (OCEAN) 0.65 % SOLN nasal spray Place 2  sprays into both nostrils 2 (two) times daily as needed for congestion.      No current facility-administered medications on file prior to visit.    Review of Systems  Constitutional: Negative for unusual diaphoresis or night sweats HENT: Negative for ear swelling or discharge Eyes: Negative for worsening visual haziness  Respiratory: Negative for choking and stridor.   Gastrointestinal: Negative for distension or worsening eructation Genitourinary: Negative for retention or change in urine volume.  Musculoskeletal: Negative for other MSK pain or swelling Skin: Negative for color change and worsening wound Neurological: Negative for tremors and numbness other than noted  Psychiatric/Behavioral: Negative for decreased concentration or agitation other than above       Objective:   Physical Exam BP 140/80   Pulse 100   Temp 98.4 F (36.9 C) (Oral)   Resp 20   Wt 268 lb (121.6 kg)   SpO2 95%   BMI 46.00 kg/m  VS noted,  Constitutional: Pt appears in no apparent distress HENT: Head: NCAT.  Right Ear: External ear normal.  Left Ear: External ear normal.  Eyes: . Pupils are equal, round, and reactive to light. Conjunctivae and EOM are normal Bilat tm's with mild erythema.  Max sinus areas non  tender.  Pharynx with mild erythema, no exudate Neck: Normal range of motion. Neck supple.  Cardiovascular: Normal rate and regular rhythm.   Pulmonary/Chest: Effort normal and breath sounds decreased without rales or wheezing.  Neurological: Pt is alert. Not confused , motor grossly intact Skin: Skin is warm. No rash, no LE edema Psychiatric: Pt behavior is normal. No agitation.     Assessment & Plan:

## 2015-10-17 NOTE — Patient Instructions (Addendum)
You had the steroid shot today, and the flu shot  Please take all new medication as prescribed - the prednisone, and cough medicine as needed  Please continue all other medications as before, and refills have been done if requested.  Please have the pharmacy call with any other refills you may need.  Please keep your appointments with your specialists as you may have planned  Please go to the XRAY Department in the Basement (go straight as you get off the elevator) for the x-ray testing  You will be contacted by phone if any changes need to be made immediately.  Otherwise, you will receive a letter about your results with an explanation, but please check with MyChart first.  Please remember to sign up for MyChart if you have not done so, as this will be important to you in the future with finding out test results, communicating by private email, and scheduling acute appointments online when needed.

## 2015-10-17 NOTE — Progress Notes (Signed)
Pre visit review using our clinic review tool, if applicable. No additional management support is needed unless otherwise documented below in the visit note. 

## 2015-10-18 NOTE — Assessment & Plan Note (Signed)
stable overall by history and exam, recent data reviewed with pt, and pt to continue medical treatment as before,  to f/u any worsening symptoms or concerns @LASTSAO2(3)@  

## 2015-10-18 NOTE — Assessment & Plan Note (Addendum)
Most likely related to allergic rhinitis and post nasal gtt this visit, for cxr r/o pulm process, but o/w tx as above, also cough med prn as she is just miserable

## 2015-10-18 NOTE — Assessment & Plan Note (Signed)
Mild to mod, for depomedrol IM, predpac asd, and restart nasonex, add zyrtec otc, consider allergy referral,  to f/u any worsening symptoms or concerns

## 2015-10-20 ENCOUNTER — Encounter: Payer: Self-pay | Admitting: Internal Medicine

## 2015-11-11 ENCOUNTER — Other Ambulatory Visit: Payer: Self-pay | Admitting: Cardiology

## 2015-11-11 ENCOUNTER — Other Ambulatory Visit: Payer: Self-pay

## 2015-11-11 MED ORDER — PROPAFENONE HCL ER 325 MG PO CP12: 325.0000 mg | ORAL_CAPSULE | Freq: Two times a day (BID) | ORAL | 3 refills | Status: DC

## 2015-11-11 NOTE — Telephone Encounter (Signed)
Rx(s) sent to pharmacy electronically.  

## 2015-11-12 ENCOUNTER — Other Ambulatory Visit (INDEPENDENT_AMBULATORY_CARE_PROVIDER_SITE_OTHER): Payer: BLUE CROSS/BLUE SHIELD

## 2015-11-12 ENCOUNTER — Encounter: Payer: Self-pay | Admitting: Internal Medicine

## 2015-11-12 ENCOUNTER — Ambulatory Visit (INDEPENDENT_AMBULATORY_CARE_PROVIDER_SITE_OTHER): Payer: BLUE CROSS/BLUE SHIELD | Admitting: Internal Medicine

## 2015-11-12 VITALS — BP 122/78 | HR 80 | Ht 64.0 in | Wt 276.0 lb

## 2015-11-12 DIAGNOSIS — Z7901 Long term (current) use of anticoagulants: Secondary | ICD-10-CM

## 2015-11-12 DIAGNOSIS — D649 Anemia, unspecified: Secondary | ICD-10-CM

## 2015-11-12 DIAGNOSIS — J449 Chronic obstructive pulmonary disease, unspecified: Secondary | ICD-10-CM | POA: Diagnosis not present

## 2015-11-12 DIAGNOSIS — Z9981 Dependence on supplemental oxygen: Secondary | ICD-10-CM

## 2015-11-12 LAB — CBC WITH DIFFERENTIAL/PLATELET
Basophils Absolute: 0 10*3/uL (ref 0.0–0.1)
Basophils Relative: 0.2 % (ref 0.0–3.0)
Eosinophils Absolute: 0.1 10*3/uL (ref 0.0–0.7)
Eosinophils Relative: 1.7 % (ref 0.0–5.0)
HCT: 32.6 % — ABNORMAL LOW (ref 36.0–46.0)
Hemoglobin: 11 g/dL — ABNORMAL LOW (ref 12.0–15.0)
Lymphocytes Relative: 25.5 % (ref 12.0–46.0)
Lymphs Abs: 1.9 10*3/uL (ref 0.7–4.0)
MCHC: 33.8 g/dL (ref 30.0–36.0)
MCV: 85 fl (ref 78.0–100.0)
Monocytes Absolute: 0.5 10*3/uL (ref 0.1–1.0)
Monocytes Relative: 6.9 % (ref 3.0–12.0)
Neutro Abs: 4.9 10*3/uL (ref 1.4–7.7)
Neutrophils Relative %: 65.7 % (ref 43.0–77.0)
Platelets: 243 10*3/uL (ref 150.0–400.0)
RBC: 3.84 Mil/uL — ABNORMAL LOW (ref 3.87–5.11)
RDW: 18.7 % — ABNORMAL HIGH (ref 11.5–15.5)
WBC: 7.5 10*3/uL (ref 4.0–10.5)

## 2015-11-12 LAB — VITAMIN B12: Vitamin B-12: 280 pg/mL (ref 211–911)

## 2015-11-12 LAB — FERRITIN: Ferritin: 12.6 ng/mL (ref 10.0–291.0)

## 2015-11-12 NOTE — Progress Notes (Signed)
Iron level is low so it will be appropriate for her to have an EGD and colonoscopy  She has upcoming appts w/ Drs Melvyn Novas and Stanford Breed - I would like to see her back in the office after those completed so Dec appt ok  Ferrous sulfate 325 mg bid please - warn it will make stools black

## 2015-11-12 NOTE — Patient Instructions (Signed)
Your physician has requested that you go to the basement for the  lab work before leaving today.   I appreciate the opportunity to care for you. Carl Gessner, MD, FACG  

## 2015-11-12 NOTE — Progress Notes (Signed)
[name] [name] 65 y.o. 08-02-1950 [id number]  Assessment & Plan:   Encounter Diagnoses  Name Primary?  . Mild anemia Yes  . Chronic obstructive pulmonary disease, unspecified COPD type ([place])   . On home oxygen therapy   . Chronic anticoagulation      CBC, iron studies, B12 No routine screening given co-morbidities Could need diagnostic w/u if iron deficient - explained Would need EGD/colonoscopy at hospital and to hold Eliquis - she is much higher risk of problems especially if complications would occur   Lab Results  Component Value Date   FERRITIN 12.6 11/12/2015   Lab Results  Component Value Date   WBC 7.5 11/12/2015   HGB 11.0 (L) 11/12/2015   HCT 32.6 (L) 11/12/2015   MCV 85.0 11/12/2015   PLT 243.0 11/12/2015   Lab Results  Component Value Date   VITAMINB12 280 11/12/2015    Turns out she is iron deficient so will need to arrange procedures but needs to see Dr. Melvyn Novas first re: lungs appt him 11/16Has appt Dr. Stanford Breed 11/20 -   Will have her start ferrous sulfate and arrange Dec f/u me  [id number] Jenny Reichmann, MD Clois Comber, MD Kirk Ruths, MD Subjective:   Chief Complaint: colon cancer screening?  HPI Elderly ww w/ COPD/emphysema on 2 L O2 - has hx colonoscopy 11-12 yrs ago negative screening. Here to review options. GI sxs - mild occ constipation. No bleeding. Chart review shows Hgb 10 5 mos ago and 11 7 mos ago - NL MCV but increased RDW. She is having frequent problems with cough and dyspnea - has been tx w/ steroids and Abx but persists. Waiting to see Dr. Melvyn Novas again - had not needed to see him for some time.  She relates frustration with [facility] and billing and believes she had a $10K bill from the previous colonoscopy or heart procedures in past that she should not have had to pay. Is still paying on that bill.  Allergies  Allergen Reactions  . Duloxetine Other (See Comments)    REACTION: rhabdomyolysis  . Penicillins Other (See Comments)    SYNCOPE   . Latex Rash   Outpatient Medications Prior to Visit  Medication Sig Dispense Refill  . Aclidinium Bromide (TUDORZA PRESSAIR) 400 MCG/ACT AEPB Inhale 1 puff into the lungs 2 (two) times daily. 3 each 1  . albuterol (PROVENTIL HFA;VENTOLIN HFA) 108 (90 Base) MCG/ACT inhaler Inhale 2 puffs into the lungs every 4 (four) hours as needed for wheezing or shortness of breath.    Marland Kitchen alendronate (FOSAMAX) 70 MG tablet Take 1 tablet (70 mg total) by mouth every 7 (seven) days. Take with a full glass of water on an empty stomach. 12 tablet 3  . apixaban (ELIQUIS) 5 MG TABS tablet Take 1 tablet (5 mg total) by mouth 2 (two) times daily. 180 tablet 3  . atorvastatin (LIPITOR) 10 MG tablet TAKE 1 TABLET DAILY 90 tablet 1  . budesonide-formoterol (SYMBICORT) 160-4.5 MCG/ACT inhaler Inhale 2 puffs into the lungs 2 (two) times daily.    . Calcium Carbonate-Vitamin D 600-400 MG-UNIT per tablet Take 1 tablet by mouth 2 (two) times daily.      . cetirizine (ZYRTEC) 10 MG tablet Take 1 tablet (10 mg total) by mouth at bedtime as needed for allergies. 90 tablet 3  . cholecalciferol (VITAMIN D) 1000 UNITS tablet Take 1,000 Units by mouth every morning.    Marland Kitchen Dextromethorphan-Guaifenesin (MUCINEX DM MAXIMUM STRENGTH) 60-1200 MG TB12 Take 1 tablet by  mouth every 12 (twelve) hours as needed (cough/congestion).    Marland Kitchen diltiazem (CARDIZEM CD) 360 MG 24 hr capsule Take 1 capsule (360 mg total) by mouth daily. 90 capsule 3  . FLUoxetine (PROZAC) 40 MG capsule Take 1 capsule (40 mg total) by mouth daily. 90 capsule 3  . furosemide (LASIX) 80 MG tablet Take one and half (1.5) tablet (120 mg total) by mouth every morning and take one (1) tablet (80 mg total) by mouth in the afternoon.    Marland Kitchen glucosamine-chondroitin 500-400 MG tablet Take 1 tablet by mouth every morning.     Marland Kitchen KLOR-CON M20 20 MEQ tablet Take 1 tablet (20 mEq total) by mouth 2 (two) times daily. 180 tablet 2  . levalbuterol (XOPENEX) 1.25 MG/3ML nebulizer solution Take  1.25 mg by nebulization every 4 (four) hours as needed for wheezing or shortness of breath.    . levothyroxine (SYNTHROID, LEVOTHROID) 25 MCG tablet Take 1 tablet (25 mcg total) by mouth daily before breakfast. 90 tablet 2  . mometasone (NASONEX) 50 MCG/ACT nasal spray Place 2 sprays into the nose 2 (two) times daily as needed (For nasal congestion.). 17 g 5  . omeprazole (PRILOSEC) 20 MG capsule Take 40 mg by mouth daily.    . propafenone (RYTHMOL SR) 325 MG 12 hr capsule Take 1 capsule (325 mg total) by mouth 2 (two) times daily. 180 capsule 3  . sodium chloride (OCEAN) 0.65 % SOLN nasal spray Place 2 sprays into both nostrils 2 (two) times daily as needed for congestion.     Marland Kitchen FLUoxetine (PROZAC) 40 MG capsule Take 40 mg by mouth daily.    Marland Kitchen HYDROcodone-acetaminophen (NORCO) 5-325 MG tablet Take 1-2 tablets by mouth every 6 (six) hours as needed for moderate pain or severe pain. 30 tablet 0  . predniSONE (DELTASONE) 10 MG tablet 4tab by mouth x 3day,3tab x 3day,2tab x 3day, 1tab x 3day 30 tablet 0   No facility-administered medications prior to visit.    Past Medical History:  Diagnosis Date  . Anemia    hx  . Anxiety   . Arthritis   . Asthma   . Atrial fibrillation and flutter ([place])   . Atypical lobular hyperplasia of right breast 05/09/2015  . Breast CA ([place])    ?  Marland Kitchen CHF (congestive heart failure) ([place])   . Chronic rhinitis   . Complication of anesthesia   . COPD (chronic obstructive pulmonary disease) ([place])    Wert. PFTs 07/25/06 FEV1 55% ratio 53, DLC0 51% HFA 50% 12/16/2009  . Depression   . FRACTURE, RIB, RIGHT 06/18/2009  . Glucose intolerance (impaired glucose tolerance)    on steroids  . Heart murmur   . History of DVT (deep vein thrombosis)   . Hx of cardiac catheterization    LHC (01/2001):  Normal Cors.  EF 65%.;  LexiScan Myoview (03/2013): No ischemia, EF 61%, normal  . Hx of echocardiogram    Echo (11/2011):  Mod LVH, EF 60-65%, no RWMA, MAC, mild LAE, PASP 34.  Marland Kitchen  HYPERLIPIDEMIA   . Hypertension   . Hypothyroidism   . Morbid obesity ([place])    target wt=179lb for BMI<30 Peak wt 282lb  . Osteoporosis    last BMD 4/08 -2.4, intolerant of bisphosphonates  . Peripheral vascular disease ([facility])    hx dvt.pe  . Pneumonia    hx  . PONV (postoperative nausea and vomiting)   . PREMATURE VENTRICULAR CONTRACTIONS   . PULMONARY EMBOLISM, HX OF 06/19/2007  .  Rhabdomyolysis 07/29/2009  . Shortness of breath dyspnea   . Tremor   . VITAMIN D DEFICIENCY    Past Surgical History:  Procedure Laterality Date  . ABDOMINAL HYSTERECTOMY    . BREAST LUMPECTOMY WITH RADIOACTIVE SEED LOCALIZATION Right 06/05/2015   Procedure: RIGHT BREAST LUMPECTOMY WITH RADIOACTIVE SEED LOCALIZATION;  Surgeon: Fanny Skates, MD;  Location: [place];  Service: General;  Laterality: Right;  . BREAST SURGERY Left    s/p mass removal  . CARDIOVERSION N/A 12/11/2014   Procedure: CARDIOVERSION;  Surgeon: Josue Hector, MD;  Location: [facility] ENDOSCOPY;  Service: Cardiovascular;  Laterality: N/A;  . COLONOSCOPY  10/22/2003, ?2006  . s/p left arm fracture with fall off chair    . skin graft to middle R finger  1975  . TEE WITHOUT CARDIOVERSION N/A 12/11/2014   Procedure: TRANSESOPHAGEAL ECHOCARDIOGRAM (TEE);  Surgeon: Josue Hector, MD;  Location: [facility] ENDOSCOPY;  Service: Cardiovascular;  Laterality: N/A;  . TONSILLECTOMY     Social History   Social History  . Marital status: Married    Spouse name: N/A  . Number of children: 3  . Years of education: N/A   Occupational History  . 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 Smoking status: Former Smoker    Packs/day: 1.00    Years: 35.00    Types: Cigarettes    Quit date: 01/12/1996  . Smokeless tobacco: Never Used  . Alcohol use No  . Drug use: No  . Sexual activity: Not Asked   Other Topics Concern  . None   Social History Narrative   Married 1 son 2 daughters   Disabled   2 caffeine/day     Past smoker   11/12/2015      Family History  Problem Relation Age of Onset  . Heart disease Mother   . Liver disease Mother     alcohol related  . Asthma Brother   . Esophageal cancer Brother   . Asthma Son   . Colon cancer Neg Hx   . Stomach cancer Neg Hx   . Pancreatic cancer Neg Hx   . Inflammatory bowel disease Neg Hx        Review of Systems As above, fatigue, joint pains, anxiety, night sweats, insomnia, pedal edema, thirsty and polyuria All other negative  Objective:   Physical Exam @BP  122/78   Pulse 80   Ht 5\' 4"  (1.626 m)   Wt 276 lb (125.2 kg)   BMI 47.38 kg/m @  General:  Chronically ill on 2L [place] O2 and morbidly obese Eyes:  anicteric. ENT:   Mouth and posterior pharynx free of lesions. Upper dentures Neck:   supple w/o thyromegaly or mass.  Lungs: dffusely decreased breath sounds auscultation bilaterally. Heart:  S1S2, no rubs, murmurs, gallops. distant Abdomen:  morbidly obesesoft, non-tender, no hepatosplenomegaly, hernia, or mass and BS+.   Lymph:  no cervical or supraclavicular adenopathy. Extremities:   trace edema, cyanosis or clubbing  Neuro:  A&O x 3.  Psych:  appropriate mood and  Affect.   Data Reviewed:  See HPI

## 2015-11-13 LAB — IRON AND TIBC
%SAT: 13 % (ref 11–50)
Iron: 51 ug/dL (ref 45–160)
TIBC: 379 ug/dL (ref 250–450)
UIBC: 328 ug/dL (ref 125–400)

## 2015-11-14 ENCOUNTER — Other Ambulatory Visit: Payer: Self-pay | Admitting: Internal Medicine

## 2015-11-23 ENCOUNTER — Encounter: Payer: Self-pay | Admitting: Internal Medicine

## 2015-11-24 MED ORDER — ALENDRONATE SODIUM 70 MG PO TABS: 70.0000 mg | ORAL_TABLET | ORAL | 0 refills | Status: DC

## 2015-11-24 NOTE — Progress Notes (Signed)
HPI: FU AFib/flutter, prior pulmonary embolism, chronic coumadin, diastolic dysfunction, morbid obesity, COPD, LE edema. Cardiac cath in 2003 was normal. Echo in 11/13 demonstrated normal LVF. Nuclear study March 2015 showed an ejection fraction 61% and normal perfusion. Seen November 2016 by Dr. Martinique and noted to be in atrial flutter. Subsequently had TEE cardioversion 11/16; TEE revealed normal LV function; mild MR; mild biatrial enlargement. Since she was last seen she does have dyspnea which she attributes to her COPD. She also has a cough. No chest pain, palpitations, syncope or pedal edema. No bleeding.   Current Outpatient Prescriptions  Medication Sig Dispense Refill  . Aclidinium Bromide (TUDORZA PRESSAIR) 400 MCG/ACT AEPB Inhale 1 puff into the lungs 2 (two) times daily. 3 each 1  . albuterol (PROVENTIL HFA;VENTOLIN HFA) 108 (90 Base) MCG/ACT inhaler Inhale 2 puffs into the lungs every 4 (four) hours as needed for wheezing or shortness of breath.    Marland Kitchen alendronate (FOSAMAX) 70 MG tablet Take 1 tablet (70 mg total) by mouth every 7 (seven) days. Take with a full glass of water on an empty stomach. 4 tablet 0  . apixaban (ELIQUIS) 5 MG TABS tablet Take 1 tablet (5 mg total) by mouth 2 (two) times daily. 180 tablet 3  . atorvastatin (LIPITOR) 10 MG tablet TAKE 1 TABLET DAILY 90 tablet 1  . budesonide-formoterol (SYMBICORT) 160-4.5 MCG/ACT inhaler Inhale 2 puffs into the lungs 2 (two) times daily.    . Calcium Carbonate-Vitamin D 600-400 MG-UNIT per tablet Take 1 tablet by mouth 2 (two) times daily.      . cetirizine (ZYRTEC) 10 MG tablet Take 1 tablet (10 mg total) by mouth at bedtime as needed for allergies. 90 tablet 3  . cholecalciferol (VITAMIN D) 1000 UNITS tablet Take 1,000 Units by mouth every morning.    Marland Kitchen Dextromethorphan-Guaifenesin (MUCINEX DM MAXIMUM STRENGTH) 60-1200 MG TB12 Take 1 tablet by mouth every 12 (twelve) hours as needed (cough/congestion).    Marland Kitchen diltiazem  (CARDIZEM CD) 360 MG 24 hr capsule Take 1 capsule (360 mg total) by mouth daily. 90 capsule 3  . FLUoxetine (PROZAC) 40 MG capsule TAKE 1 CAPSULE DAILY 90 capsule 0  . furosemide (LASIX) 80 MG tablet Take one and half (1.5) tablet (120 mg total) by mouth every morning and take one (1) tablet (80 mg total) by mouth in the afternoon.    Marland Kitchen glucosamine-chondroitin 500-400 MG tablet Take 1 tablet by mouth every morning.     Marland Kitchen KLOR-CON M20 20 MEQ tablet Take 1 tablet (20 mEq total) by mouth 2 (two) times daily. 180 tablet 2  . levalbuterol (XOPENEX) 1.25 MG/3ML nebulizer solution Take 1.25 mg by nebulization every 4 (four) hours as needed for wheezing or shortness of breath.    . levothyroxine (SYNTHROID, LEVOTHROID) 25 MCG tablet Take 1 tablet (25 mcg total) by mouth daily before breakfast. 90 tablet 2  . mometasone (NASONEX) 50 MCG/ACT nasal spray Place 2 sprays into the nose 2 (two) times daily as needed (For nasal congestion.). 17 g 5  . omeprazole (PRILOSEC) 20 MG capsule Take 20 mg by mouth daily.     . OXYGEN Inhale 2.5 L/min into the lungs continuous.    . predniSONE (DELTASONE) 10 MG tablet Take  4 each am x 2 days,   2 each am x 2 days,  1 each am x 2 days and stop 14 tablet 0  . propafenone (RYTHMOL SR) 325 MG 12 hr capsule Take 1  capsule (325 mg total) by mouth 2 (two) times daily. 180 capsule 3  . sodium chloride (OCEAN) 0.65 % SOLN nasal spray Place 2 sprays into both nostrils 2 (two) times daily as needed for congestion.      No current facility-administered medications for this visit.      Past Medical History:  Diagnosis Date  . Anemia    hx  . Anxiety   . Arthritis   . Asthma   . Atrial fibrillation and flutter ([place])   . Atypical lobular hyperplasia of right breast 05/09/2015  . Breast CA ([place])    ?  Marland Kitchen CHF (congestive heart failure) ([place])   . Chronic rhinitis   . Complication of anesthesia   . COPD (chronic obstructive pulmonary disease) ([place])    [facility]. PFTs 07/25/06 FEV1  55% ratio 53, DLC0 51% HFA 50% 12/16/2009  . Depression   . FRACTURE, RIB, RIGHT 06/18/2009  . Glucose intolerance (impaired glucose tolerance)    on steroids  . Heart murmur   . History of DVT (deep vein thrombosis)   . Hx of cardiac catheterization    LHC (01/2001):  Normal Cors.  EF 65%.;  LexiScan Myoview (03/2013): No ischemia, EF 61%, normal  . Hx of echocardiogram    Echo (11/2011):  Mod LVH, EF 60-65%, no RWMA, MAC, mild LAE, PASP 34.  Marland Kitchen HYPERLIPIDEMIA   . Hypertension   . Hypothyroidism   . Morbid obesity ([place])    target wt=179lb for BMI<30 Peak wt 282lb  . Osteoporosis    last BMD 4/08 -2.4, intolerant of bisphosphonates  . Peripheral vascular disease ([facility])    hx dvt.pe  . Pneumonia    hx  . PONV (postoperative nausea and vomiting)   . PREMATURE VENTRICULAR CONTRACTIONS   . PULMONARY EMBOLISM, HX OF 06/19/2007  . Rhabdomyolysis 07/29/2009  . Shortness of breath dyspnea   . Tremor   . VITAMIN D DEFICIENCY     Past Surgical History:  Procedure Laterality Date  . ABDOMINAL HYSTERECTOMY    . BREAST LUMPECTOMY WITH RADIOACTIVE SEED LOCALIZATION Right 06/05/2015   Procedure: RIGHT BREAST LUMPECTOMY WITH RADIOACTIVE SEED LOCALIZATION;  Surgeon: Fanny Skates, MD;  Location: [place];  Service: General;  Laterality: Right;  . BREAST SURGERY Left    s/p mass removal  . CARDIOVERSION N/A 12/11/2014   Procedure: CARDIOVERSION;  Surgeon: Josue Hector, MD;  Location: [facility] ENDOSCOPY;  Service: Cardiovascular;  Laterality: N/A;  . COLONOSCOPY  10/22/2003, ?2006  . s/p left arm fracture with fall off chair    . skin graft to middle R finger  1975  . TEE WITHOUT CARDIOVERSION N/A 12/11/2014   Procedure: TRANSESOPHAGEAL ECHOCARDIOGRAM (TEE);  Surgeon: Josue Hector, MD;  Location[facility] ENDOSCOPY;  Service: Cardiovascular;  Laterality: N/A;  . TONSILLECTOMY      Social History   Social History  . Marital status: Married    Spouse name: N/A  . Number of children: 3  . Years of  education: N/A   Occupational History  . 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 Smoking status: Former Smoker    Packs/day: 1.00    Years: 35.00    Types: Cigarettes    Quit date: 01/12/1996  . Smokeless tobacco: Never Used  . Alcohol use No  . Drug use: No  . Sexual activity: Not on file   Other Topics Concern  . Not on file   Social History Narrative   Married  1 son 2 daughters   Disabled   2 caffeine/day   Past smoker   11/12/2015       Family History  Problem Relation Age of Onset  . Heart disease Mother   . Liver disease Mother     alcohol related  . Asthma Brother   . Esophageal cancer Brother   . Asthma Son   . Colon cancer Neg Hx   . Stomach cancer Neg Hx   . Pancreatic cancer Neg Hx   . Inflammatory bowel disease Neg Hx     ROS: no fevers or chills, productive cough, hemoptysis, dysphasia, odynophagia, melena, hematochezia, dysuria, hematuria, rash, seizure activity, orthopnea, PND, pedal edema, claudication. Remaining systems are negative.  Physical Exam: Well-developed obese in no acute distress.  Skin is warm and dry.  HEENT is normal.  Neck is supple.  Chest is clear to auscultation with normal expansion.  Cardiovascular exam is regular rate and rhythm.  Abdominal exam nontender or distended. No masses palpated. Extremities show no edema. neuro grossly intact  ECG-Sinus rhythm with occasional PACs. Normal axis. Nonspecific ST changes.  A/P  1 Chronic diastolic congestive heart failure-continue present dose of diuretic. Patient appears to be euvolemic.   2 paroxysmal atrial fibrillation-patient remains in sinus rhythm in exam. Continue propafenone, Cardizem and apixaban.  3 hypertension-blood pressure mildly elevated. However she follows this at home and it is typically control. Continue present medications and increase as needed.  4 hyperlipidemia continue statin.  5 obesity-we discussed the  importance of weight loss.  Kirk Ruths, MD

## 2015-11-27 ENCOUNTER — Encounter: Payer: Self-pay | Admitting: Internal Medicine

## 2015-11-27 ENCOUNTER — Ambulatory Visit (INDEPENDENT_AMBULATORY_CARE_PROVIDER_SITE_OTHER): Payer: BLUE CROSS/BLUE SHIELD | Admitting: Internal Medicine

## 2015-11-27 VITALS — BP 140/88 | HR 85 | Ht 64.0 in | Wt 273.0 lb

## 2015-11-27 DIAGNOSIS — J9612 Chronic respiratory failure with hypercapnia: Secondary | ICD-10-CM

## 2015-11-27 DIAGNOSIS — J9611 Chronic respiratory failure with hypoxia: Secondary | ICD-10-CM

## 2015-11-27 DIAGNOSIS — R059 Cough, unspecified: Secondary | ICD-10-CM

## 2015-11-27 DIAGNOSIS — J449 Chronic obstructive pulmonary disease, unspecified: Secondary | ICD-10-CM

## 2015-11-27 DIAGNOSIS — R05 Cough: Secondary | ICD-10-CM

## 2015-11-27 MED ORDER — PREDNISONE 10 MG PO TABS: ORAL_TABLET | ORAL | 0 refills | Status: DC

## 2015-11-27 NOTE — Patient Instructions (Addendum)
Prednisone 10 mg take  4 each am x 2 days,   2 each am x 2 days,  1 each am x 2 days and stop  Stay off tudorza and fosfamax for now and take pepcid ac 20 mg at bedtime   GERD (REFLUX)  is an extremely common cause of respiratory symptoms just like yours , many times with no obvious heartburn at all.    It can be treated with medication, but also with lifestyle changes including elevation of the head of your bed (ideally with 6 inch  bed blocks),  Smoking cessation, avoidance of late meals, excessive alcohol, and avoid fatty foods, chocolate, peppermint, colas, red wine, and acidic juices such as orange juice.  NO MINT OR MENTHOL PRODUCTS SO NO COUGH DROPS   USE SUGARLESS CANDY INSTEAD (Jolley ranchers or Stover's or Life Savers) or even ice chips will also do - the key is to swallow to prevent all throat clearing. NO OIL BASED VITAMINS - use powdered substitutes.    For drainage / throat tickle try take CHLORPHENIRAMINE  4 mg - take one every 4 hours as needed - available over the counter- may cause drowsiness so start with just a bedtime dose or two and see how you tolerate it before trying in daytime    See [name] NP w/in 2 weeks or first available with all your medications, even over the counter meds, separated in two separate bags, the ones you take no matter what vs the ones you stop once you feel better and take only as needed when you feel you need them.   [name]  will generate for you a new user friendly medication calendar that will put [place] all on the same page re: your medication use.     Without this process, it simply isn't possible to assure that we are providing  your outpatient care  with  the attention to detail we feel you deserve.   If we cannot assure that you're getting that kind of care,  then we cannot manage your problem effectively from this clinic.  Once you have seen [name] and we are sure that we're all on the same page with your medication use she will arrange follow up  with me.

## 2015-11-27 NOTE — Progress Notes (Signed)
Subjective:    Patient ID: [name] [name] [name], female    DOB: 08-14-1950.   MRN: [id number]    Brief patient profile:  21 yowf last smoked 2003 with COPD GOLD II  with minimum asthmatic component documented 07/25/06. On 02 24 hours per day at 2.5 lpm       History of Present Illness  11/26/2014  f/u ov/[name] [name] [name] re: copd/ severe obesity /uacs  - has med calendar, not really using the action plans at the bottom Chief Complaint  Patient presents with  . Follow-up    Pt c/o increased cough for the past few months non prod.  She states "feels like I am downding in fluid".     On 2-2.5  24/7 able to do [employer] with 02 in a basket  sleep ok/ sensation of pnds day > noct   rec For drainage / throat tickle try take CHLORPHENIRAMINE  4 mg - take one every 4 hours as needed See calendar for specific medication instructions  11/27/2015 extended  f/u ov/[name] [name] [name] re: new cough x ever since ET for surgery on sinuses Jun 05 2015 / symb 160 2bid  Chief Complaint  Patient presents with  . Follow-up    She c/o allergy symptoms today- watery eyes and runny nose. She has seen ENT for her sinus problems recently. She states her breathing is about the same.  She has good and bad days. She rarely uses albuterol due to tachycardia.   doe = MMRC2 = can't walk a nl [name] [name] on a flat grade s sob but does fine slow and flat eg shopping with 02 in cart  Cough was abrupt onset post op/ dry/ day > noct s overt pnds / wheeze or change in doe   No obvious day to day or daytime variabilty or assoc excess/ purulent sputum or mucus plugs   or cp or chest tightness, subjective wheeze overt  hb symptoms. No unusual exp hx or h/o childhood pna/ asthma or knowledge of premature birth.  Sleeping ok without nocturnal  or early am exacerbation  of respiratory  c/o's or need for noct saba. Also denies any obvious fluctuation of symptoms with weather or environmental changes or other aggravating or alleviating factors except as outlined above    Current Medications, Allergies, Complete Past Medical History, Past Surgical History, Family History, and Social History were reviewed in [employer] record.  ROS  The following are not active complaints unless bolded sore throat, dysphagia, dental problems, itching, sneezing,  nasal congestion or excess/ purulent secretions, ear ache,   fever, chills, sweats, unintended wt loss, pleuritic or exertional cp, hemoptysis,  orthopnea pnd or leg swelling, presyncope, palpitations, heartburn, abdominal pain, anorexia, nausea, vomiting, diarrhea  or change in bowel or urinary habits, change in stools or urine, dysuria,hematuria,  rash, arthralgias, visual complaints, headache, numbness weakness or ataxia or problems with walking or coordination,  change in mood/affect or memory.           Past Medical History:  CHRONIC RHINITIS (ICD-472.0)  EDEMA (ICD-782.3)  ACUTE AND CHRONIC RESPIRATORY FAILURE (ICD-518.84)  MORBID OBESITY (ICD-278.01)  - Target wt = 179 for BMI < 30 peak wt 282  - Referred back to nutrition December 18, 2007  DEEP VENOUS THROMBOPHLEBITIS, HX OF (ICD-V12.52)  COPD (ICD-496)..........................................................Marland KitchenWert  - PFTs 07/25/06 FEV1 55% ratio 53, DLC0 51%  - HFA 50% December 16, 2009 > 75% March 27, 2010  DEPRESSION (ICD-311)  OSTEOPOROSIS (ICD-733.00) last BMD 4/08 -2.4, intolerant  of bisphosphonates  HYPERTENSION (ICD-401.9)  Atrial fibrillation - paroxysmal  Anticoagulation therapy  Pulmonary embolism, hx of  neg stress myoview 10/07  Anxiety  glucose intolerance - on steroids  Hyperlipidemia  Complex medical regimen  - Med calendar done 05/01/2008, 08/26/2010 , 01/17/12 , 06/26/2013          Objective:   Physical Exam  GEN: A/Ox3; pleasant , NAD, obese wf nad  Vital signs reviewed   - Note on arrival 02 sats  96% on 2.5lpm NP     Wt  274 09/01/10 >   09/29/2010  275 > 01/01/2011  280 > 04/29/2011  279 > 285 11/17/2011   > 288 02/18/2012 > 281 04/19/2012 > 03/13/2013  282 >  09/11/2013  282 >  11/26/2014  259 > 11/27/2015  273    HEENT:  [place]/AT,  EACs-clear, TMs-wnl, NOSE-clear drainage, THROAT-clear, no lesions, no postnasal drip or exudate noted.   NECK:  Supple w/ fair ROM; no JVD; normal carotid impulses w/o bruits; no thyromegaly or nodules palpated; no lymphadenopathy.    RESP  Diminshed BS in bases  w/o, wheezes/ rales/ or rhonchi. no accessory muscle use, no dullness to percussion  CARD:  RRR, no m/r/g   - trace to 1+ sym bilateral lower ext   edema, pulses intact, no cyanosis or clubbing.  GI:   Soft & nt; nml bowel sounds; no organomegaly or masses detected.   Musco: Warm bil, no deformities or joint swelling noted.   Neuro: alert, no focal deficits noted, very min bilat resting tremor    Skin: Warm, no lesions or rashes      I personally reviewed images and agree with radiology impression as follows:  CXR:   10/17/15  COPD.  No evidence of pneumonia.  Stable mild cardiomegaly.        Assessment & Plan:

## 2015-11-28 ENCOUNTER — Encounter: Payer: Self-pay | Admitting: Internal Medicine

## 2015-11-30 NOTE — Assessment & Plan Note (Signed)
-   PFTs 07/25/06 FEV1 55% ratio 53, DLC0 51%  -  PFT's 04/19/2012 FEV1 50% (ratio 51) with 19% improvement p B2, dlco 42 > 99% corrected  - med calendar 01/17/2012  -Tudorza trial  01/17/12 > permanently d/c 11/27/2015 due to uacs    Well compensated and more limited by obesity than copd clinically/ main issue now is cough so ok to stay off tudorza for now.

## 2015-11-30 NOTE — Assessment & Plan Note (Addendum)
Of the three most common causes of chronic cough, only one (GERD)  can actually cause the other two (asthma and post nasal drip syndrome)  and perpetuate the cylce of cough inducing airway trauma, inflammation, heightened sensitivity to reflux which is prompted by the cough itself via a cyclical mechanism.    This may partially respond to steroids and look like asthma and post nasal drainage but never erradicated completely unless the cough and the secondary reflux are eliminated, preferably both at the same time.  While not intuitively obvious, many patients with chronic low grade reflux do not cough until there is a secondary insult that disturbs the protective epithelial barrier and exposes sensitive nerve endings.  This can be viral or direct physical injury such as with an endotracheal tube as is likely the case here.   The point is that once this occurs, it is difficult to eliminate using anything but a maximally effective acid suppression regimen at least in the short run, accompanied by an appropriate diet to address non acid GERD.   rec try off fosfamax/ max rx for gerd then regroup with med reconciliation  I had an extended discussion with the patient reviewing all relevant studies completed to date and  lasting 25 minutes of a 40  Minute re-establish  visit  re new complaints / non-specific but potentially very serious pulmonary symptoms of unknown etiology.  Each maintenance medication was reviewed in detail including most importantly the difference between maintenance and prns and under what circumstances the prns are to be triggered using an action plan format that is not reflected in the computer generated alphabetically organized AVS.    Please see instructions for details which were reviewed in writing and the patient given a copy highlighting the part that I personally wrote and discussed at today's ov.

## 2015-11-30 NOTE — Assessment & Plan Note (Signed)
-   09/29/10  Walked 3lpm  2 laps @ 185 ft each stopped due to  Sob with sats 90%     - HC03 11/08/11 = 34     - 11-17-11--o2 sat on ra at rest 83%ra     - 02/18/2012  Sat 88% RA across the room > Walked 3lpm x 3 laps @ 185 ft each stopped due to  End of study no desat     - 04/19/2012  Walked 3lpm x 3 laps @ 185 ft each stopped due to  End of study, sats dropped to 88 at very end  - HC03  34    08/01/15   Adequate control on present rx, reviewed > no change in rx needed

## 2015-11-30 NOTE — Assessment & Plan Note (Signed)
Body mass index is 46.86   Lab Results  Component Value Date   TSH 5.05 (H) 04/11/2015     Contributing to gerd tendency/ doe/reviewed the need and the process to achieve and maintain neg calorie balance > defer f/u primary care including intermittently monitoring thyroid status

## 2015-12-01 ENCOUNTER — Ambulatory Visit (INDEPENDENT_AMBULATORY_CARE_PROVIDER_SITE_OTHER): Payer: BLUE CROSS/BLUE SHIELD | Admitting: Cardiology

## 2015-12-01 ENCOUNTER — Encounter: Payer: Self-pay | Admitting: Cardiology

## 2015-12-01 VITALS — BP 154/66 | HR 96 | Ht 64.0 in | Wt 271.4 lb

## 2015-12-01 DIAGNOSIS — I4891 Unspecified atrial fibrillation: Secondary | ICD-10-CM

## 2015-12-01 DIAGNOSIS — I10 Essential (primary) hypertension: Secondary | ICD-10-CM

## 2015-12-01 DIAGNOSIS — I5032 Chronic diastolic (congestive) heart failure: Secondary | ICD-10-CM | POA: Diagnosis not present

## 2015-12-01 DIAGNOSIS — E7800 Pure hypercholesterolemia, unspecified: Secondary | ICD-10-CM

## 2015-12-01 NOTE — Patient Instructions (Signed)
Your physician wants you to follow-up in: 6 MONTHS WITH DR CRENSHAW You will receive a reminder letter in the mail two months in advance. If you don't receive a letter, please call our office to schedule the follow-up appointment.   If you need a refill on your cardiac medications before your next appointment, please call your pharmacy.  

## 2015-12-08 ENCOUNTER — Telehealth: Payer: Self-pay | Admitting: Internal Medicine

## 2015-12-08 NOTE — Telephone Encounter (Signed)
Patient notified iron will turn her stools black.  She is asked to keep her appt on 12/22/15

## 2015-12-09 ENCOUNTER — Telehealth: Payer: Self-pay | Admitting: Cardiology

## 2015-12-09 NOTE — Telephone Encounter (Signed)
Rs.Puerto is calling because she received a letter from [facility] about her medication Eliquis. They are asking that you give them a call for prior authorization for the Eliquis at [phone].Marland Kitchen Please call   Thanks

## 2015-12-09 NOTE — Telephone Encounter (Signed)
Spoke with pt, aware [facility] cover my meds.

## 2015-12-12 ENCOUNTER — Encounter: Payer: Self-pay | Admitting: Adult Health

## 2015-12-12 ENCOUNTER — Ambulatory Visit (INDEPENDENT_AMBULATORY_CARE_PROVIDER_SITE_OTHER): Payer: BLUE CROSS/BLUE SHIELD | Admitting: Adult Health

## 2015-12-12 VITALS — BP 138/70 | HR 90 | Temp 97.6°F | Ht 64.0 in | Wt 272.4 lb

## 2015-12-12 DIAGNOSIS — J9611 Chronic respiratory failure with hypoxia: Secondary | ICD-10-CM | POA: Diagnosis not present

## 2015-12-12 DIAGNOSIS — J9612 Chronic respiratory failure with hypercapnia: Secondary | ICD-10-CM

## 2015-12-12 DIAGNOSIS — J449 Chronic obstructive pulmonary disease, unspecified: Secondary | ICD-10-CM | POA: Diagnosis not present

## 2015-12-12 MED ORDER — BUDESONIDE-FORMOTEROL FUMARATE 160-4.5 MCG/ACT IN AERO: 2.0000 | INHALATION_SPRAY | Freq: Two times a day (BID) | RESPIRATORY_TRACT | 2 refills | Status: DC

## 2015-12-12 NOTE — Assessment & Plan Note (Signed)
Cont on o2 .  

## 2015-12-12 NOTE — Patient Instructions (Signed)
Follow  med calendar closely and bring to each visit.  Follow up Dr. Melvyn Novas  In 4 months and As needed

## 2015-12-12 NOTE — Progress Notes (Signed)
Subjective:    Patient ID: [name] [name], female    DOB: 21-Jun-1950.   MRN: [id number]    Brief patient profile:  16 yowf last smoked 2003 with COPD GOLD II  with minimum asthmatic component documented 07/25/06. On 02 24 hours per day at 2.5 lpm       History of Present Illness  11/26/2014  f/u ov/Wert re: copd/ severe obesity /uacs  - has med calendar, not really using the action plans at the bottom Chief Complaint  Patient presents with  . Follow-up    Pt c/o increased cough for the past few months non prod.  She states "feels like I am downding in fluid".     On 2-2.5  24/7 able to do [employer] with 02 in a basket  sleep ok/ sensation of pnds day > noct   rec For drainage / throat tickle try take CHLORPHENIRAMINE  4 mg - take one every 4 hours as needed See calendar for specific medication instructions  11/27/2015 extended  f/u ov/Wert re: new cough x ever since ET for surgery on sinuses Jun 05 2015 / symb 160 2bid  Chief Complaint  Patient presents with  . Follow-up    She c/o allergy symptoms today- watery eyes and runny nose. She has seen ENT for her sinus problems recently. She states her breathing is about the same.  She has good and bad days. She rarely uses albuterol due to tachycardia.   doe = MMRC2 = can't walk a nl [name] on a flat grade s sob but does fine slow and flat eg shopping with 02 in cart  Cough was abrupt onset post op/ dry/ day > noct s overt pnds / wheeze or change in doe  >>pred taper .   12/12/2015 Follow up : COPD /O2 RF  Pt returns for 2 week follow up for recent COPD flare .  Breathing is doing better. Tx w/ predniosne  Remains on  Symbicort. Caprice Renshaw was stoped last ov due to throat irritation.  We reviewed all her meds and organized them into a med calendar with pt education.  She denies chest pain, orthopnea, edema or fever.   Has stomach bug with diarrhea, vomiting at Thanksgiving . Took 3-4 days to get over it.  Eating better now. No vomiting .  Has soreness in ribs from vomitting so much . It is getting better. No fever, or bloody stools.   Under some stress, husband is losing job.   Current Medications, Allergies, Complete Past Medical History, Past Surgical History, Family History, and Social History were reviewed in [employer] record.  ROS  The following are not active complaints unless bolded sore throat, dysphagia, dental problems, itching, sneezing,  nasal congestion or excess/ purulent secretions, ear ache,   fever, chills, sweats, unintended wt loss, pleuritic or exertional cp, hemoptysis,  orthopnea pnd or leg swelling, presyncope, palpitations, heartburn, abdominal pain, anorexia, nausea, vomiting, diarrhea  or change in bowel or urinary habits, change in stools or urine, dysuria,hematuria,  rash, arthralgias, visual complaints, headache, numbness weakness or ataxia or problems with walking or coordination,  change in mood/affect or memory.           Past Medical History:  CHRONIC RHINITIS (ICD-472.0)  EDEMA (ICD-782.3)  ACUTE AND CHRONIC RESPIRATORY FAILURE (ICD-518.84)  MORBID OBESITY (ICD-278.01)  - Target wt = 179 for BMI < 30 peak wt 282  - Referred back to nutrition December 18, 2007  DEEP VENOUS THROMBOPHLEBITIS,  HX OF (ICD-V12.52)  COPD (ICD-496)..........................................................Marland KitchenWert  - PFTs 07/25/06 FEV1 55% ratio 53, DLC0 51%  - HFA 50% December 16, 2009 > 75% March 27, 2010  DEPRESSION (ICD-311)  OSTEOPOROSIS (ICD-733.00) last BMD 4/08 -2.4, intolerant of bisphosphonates  HYPERTENSION (ICD-401.9)  Atrial fibrillation - paroxysmal  Anticoagulation therapy  Pulmonary embolism, hx of  neg stress myoview 10/07  Anxiety  glucose intolerance - on steroids  Hyperlipidemia  Complex medical regimen  - Med calendar done 05/01/2008, 08/26/2010 , 01/17/12 , 06/26/2013 , 12/12/2015          Objective:   Physical Exam   Vitals:   12/12/15 1211  BP: 138/70  Pulse:  90  Temp: 97.6 F (36.4 C)  TempSrc: Oral  SpO2: 97%  Weight: 272 lb 6.4 oz (123.6 kg)  Height: 5\' 4"  (1.626 m)    GEN: A/Ox3; pleasant , NAD, obese wf nad  Vital signs reviewed   - Note on arrival 02 sats  97% on 2.5lpm NP     Wt  274 09/01/10 >   09/29/2010  275 > 01/01/2011  280 > 04/29/2011  279 > 285 11/17/2011  > 288 02/18/2012 > 281 04/19/2012 > 03/13/2013  282 >  09/11/2013  282 >  11/26/2014  259 > 11/27/2015  273    HEENT:  [place]/AT,  EACs-clear, TMs-wnl, NOSE-clear drainage, THROAT-clear, no lesions, no postnasal drip or exudate noted.   NECK:  Supple w/ fair ROM; no JVD; normal carotid impulses w/o bruits; no thyromegaly or nodules palpated; no lymphadenopathy.    RESP  Diminshed BS in bases  w/o, wheezes/ rales/ or rhonchi. no accessory muscle use, no dullness to percussion  CARD:  RRR, no m/r/g   - trace to 1+ sym bilateral lower ext   edema, pulses intact, no cyanosis or clubbing.  GI:   Soft & nt; nml bowel sounds; no organomegaly or masses detected.   Musco: Warm bil, no deformities or joint swelling noted.   Neuro: alert, no focal deficits noted, very min bilat resting tremor    Skin: Warm, no lesions or rashes       CXR:   10/17/15  COPD.  No evidence of pneumonia.  Stable mild cardiomegaly.    [name] Parrett NP-C  [facility]  12/12/2015

## 2015-12-12 NOTE — Progress Notes (Signed)
Chart and office note reviewed in detail  > agree with a/p as outlined    

## 2015-12-12 NOTE — Assessment & Plan Note (Signed)
Recent flare now resolving  Patient's medications were reviewed today and patient education was given. Computerized medication calendar was adjusted/completed    Plan  Patient Instructions  Follow  med calendar closely and bring to each visit.  Follow up Dr. Melvyn Novas  In 4 months and As needed

## 2015-12-15 ENCOUNTER — Other Ambulatory Visit: Payer: Self-pay | Admitting: Internal Medicine

## 2015-12-18 ENCOUNTER — Other Ambulatory Visit: Payer: Self-pay | Admitting: Cardiology

## 2015-12-18 DIAGNOSIS — I4891 Unspecified atrial fibrillation: Secondary | ICD-10-CM

## 2015-12-22 ENCOUNTER — Ambulatory Visit (INDEPENDENT_AMBULATORY_CARE_PROVIDER_SITE_OTHER): Payer: Medicare Other | Admitting: Internal Medicine

## 2015-12-22 ENCOUNTER — Telehealth: Payer: Self-pay

## 2015-12-22 ENCOUNTER — Encounter: Payer: Self-pay | Admitting: Internal Medicine

## 2015-12-22 VITALS — BP 134/60 | HR 80 | Ht 62.5 in | Wt 275.4 lb

## 2015-12-22 DIAGNOSIS — D508 Other iron deficiency anemias: Secondary | ICD-10-CM | POA: Diagnosis not present

## 2015-12-22 DIAGNOSIS — Z7901 Long term (current) use of anticoagulants: Secondary | ICD-10-CM | POA: Diagnosis not present

## 2015-12-22 NOTE — Progress Notes (Signed)
 [name] [name] 65 y.o. 07/01/1950 [id number]  Assessment & Plan:   Encounter Diagnoses  Name Primary?  . Other iron deficiency anemia Yes  . Chronic anticoagulation     COPD seems relatively stable as does CHF Does have a dry cough and some peripheral edema  It is appropriate to look for cause of iron-def anemia - plan for EGD/colonoscopy at hospital due to co-morbidities. Anticipate holding Eliquis 1-2 d before and will clarify w/ cardiology. She understands extra risk of thrombotic or embolic event and possible stroke off this medication.  Recheck CBC, ferritin  I appreciate the opportunity to care for you. Cc:James John, MD Mike Wert, MD Brian Crenshaw, MD     Subjective:   Chief Complaint: anemia, low iron  HPI  She has been taking iron since I discovered low ferritin 6 weeks ago. Stable overall except frustrated by a dry cough and going back to pulmonary. No signs of bleeding. Does eat iron rich foods  Allergies  Allergen Reactions  . Duloxetine Other (See Comments)    REACTION: rhabdomyolysis  . Penicillins Other (See Comments)    SYNCOPE  . Latex Rash   Outpatient Medications Prior to Visit  Medication Sig Dispense Refill  . albuterol (PROVENTIL HFA;VENTOLIN HFA) 108 (90 Base) MCG/ACT inhaler Inhale 2 puffs into the lungs every 4 (four) hours as needed for wheezing or shortness of breath.    . apixaban (ELIQUIS) 5 MG TABS tablet Take 1 tablet (5 mg total) by mouth 2 (two) times daily. 180 tablet 3  . atorvastatin (LIPITOR) 10 MG tablet TAKE 1 TABLET DAILY 90 tablet 1  . budesonide-formoterol (SYMBICORT) 160-4.5 MCG/ACT inhaler Inhale 2 puffs into the lungs 2 (two) times daily. 3 Inhaler 2  . Calcium Carbonate-Vitamin D 600-400 MG-UNIT per tablet Take 1 tablet by mouth 2 (two) times daily.      . cetirizine (ZYRTEC) 10 MG tablet Take 1 tablet (10 mg total) by mouth at bedtime as needed for allergies. 90 tablet 3  . cholecalciferol (VITAMIN D) 1000 UNITS  tablet Take 1,000 Units by mouth every morning.    . Dextromethorphan-Guaifenesin (MUCINEX DM MAXIMUM STRENGTH) 60-1200 MG TB12 Take 1 tablet by mouth every 12 (twelve) hours as needed (cough/congestion).    . diltiazem (CARDIZEM CD) 360 MG 24 hr capsule Take 1 capsule (360 mg total) by mouth daily. 90 capsule 3  . FLUoxetine (PROZAC) 40 MG capsule TAKE 1 CAPSULE DAILY 90 capsule 0  . furosemide (LASIX) 80 MG tablet Take one and half (1.5) tablet (120 mg total) by mouth every morning and take one (1) tablet (80 mg total) by mouth in the afternoon.    . furosemide (LASIX) 80 MG tablet TAKE ONE AND ONE-HALF TABLETS IN THE MORNING AND 1 TABLET IN THE AFTERNOON 225 tablet 2  . glucosamine-chondroitin 500-400 MG tablet Take 1 tablet by mouth every morning.     . KLOR-CON M20 20 MEQ tablet Take 1 tablet (20 mEq total) by mouth 2 (two) times daily. 180 tablet 2  . levalbuterol (XOPENEX) 1.25 MG/3ML nebulizer solution Take 1.25 mg by nebulization every 4 (four) hours as needed for wheezing or shortness of breath.    . levothyroxine (SYNTHROID, LEVOTHROID) 25 MCG tablet Take 1 tablet (25 mcg total) by mouth daily before breakfast. 90 tablet 2  . mometasone (NASONEX) 50 MCG/ACT nasal spray Place 2 sprays into the nose 2 (two) times daily as needed (For nasal congestion.). 17 g 5  . omeprazole (PRILOSEC) 20 MG   capsule TAKE 2 CAPSULES DAILY 180 capsule 2  . OXYGEN Inhale 2.5 L/min into the lungs continuous.    . propafenone (RYTHMOL SR) 325 MG 12 hr capsule Take 1 capsule (325 mg total) by mouth 2 (two) times daily. 180 capsule 3  . sodium chloride (OCEAN) 0.65 % SOLN nasal spray Place 2 sprays into both nostrils 2 (two) times daily as needed for congestion.     . Aclidinium Bromide (TUDORZA PRESSAIR) 400 MCG/ACT AEPB Inhale 1 puff into the lungs 2 (two) times daily. (Patient not taking: Reported on 12/12/2015) 3 each 1  . alendronate (FOSAMAX) 70 MG tablet Take 1 tablet (70 mg total) by mouth every 7 (seven)  days. Take with a full glass of water on an empty stomach. (Patient not taking: Reported on 12/12/2015) 4 tablet 0  . predniSONE (DELTASONE) 10 MG tablet Take  4 each am x 2 days,   2 each am x 2 days,  1 each am x 2 days and stop 14 tablet 0   No facility-administered medications prior to visit.    Past Medical History:  Diagnosis Date  . Anemia    hx  . Anxiety   . Arthritis   . Asthma   . Atrial fibrillation and flutter (HCC)   . Atypical lobular hyperplasia of right breast 05/09/2015  . Breast CA (HCC)    ?  . CHF (congestive heart failure) (HCC)   . Chronic rhinitis   . Complication of anesthesia   . COPD (chronic obstructive pulmonary disease) (HCC)    Wert. PFTs 07/25/06 FEV1 55% ratio 53, DLC0 51% HFA 50% 12/16/2009  . Depression   . FRACTURE, RIB, RIGHT 06/18/2009  . Glucose intolerance (impaired glucose tolerance)    on steroids  . Heart murmur   . History of DVT (deep vein thrombosis)   . Hx of cardiac catheterization    LHC (01/2001):  Normal Cors.  EF 65%.;  LexiScan Myoview (03/2013): No ischemia, EF 61%, normal  . Hx of echocardiogram    Echo (11/2011):  Mod LVH, EF 60-65%, no RWMA, MAC, mild LAE, PASP 34.  . HYPERLIPIDEMIA   . Hypertension   . Hypothyroidism   . Morbid obesity (HCC)    target wt=179lb for BMI<30 Peak wt 282lb  . Osteoporosis    last BMD 4/08 -2.4, intolerant of bisphosphonates  . Peripheral vascular disease (HCC)    hx dvt.pe  . Pneumonia    hx  . PONV (postoperative nausea and vomiting)   . PREMATURE VENTRICULAR CONTRACTIONS   . PULMONARY EMBOLISM, HX OF 06/19/2007  . Rhabdomyolysis 07/29/2009  . Shortness of breath dyspnea   . Tremor   . VITAMIN D DEFICIENCY    Past Surgical History:  Procedure Laterality Date  . ABDOMINAL HYSTERECTOMY    . BREAST LUMPECTOMY WITH RADIOACTIVE SEED LOCALIZATION Right 06/05/2015   Procedure: RIGHT BREAST LUMPECTOMY WITH RADIOACTIVE SEED LOCALIZATION;  Surgeon: Haywood Ingram, MD;  Location: [facility] OR;  Service:  General;  Laterality: Right;  . BREAST SURGERY Left    s/p mass removal  . CARDIOVERSION N/A 12/11/2014   Procedure: CARDIOVERSION;  Surgeon: Peter C Nishan, MD;  Location: [facility] ENDOSCOPY;  Service: Cardiovascular;  Laterality: N/A;  . COLONOSCOPY  10/22/2003, ?2006  . s/p left arm fracture with fall off chair    . skin graft to middle R finger  1975  . TEE WITHOUT CARDIOVERSION N/A 12/11/2014   Procedure: TRANSESOPHAGEAL ECHOCARDIOGRAM (TEE);  Surgeon: Peter C Nishan, MD;  Location: [facility]   ENDOSCOPY;  Service: Cardiovascular;  Laterality: N/A;  . TONSILLECTOMY     Social History   Social History  . Marital status: Married    Spouse name: N/A  . Number of children: 3  . Years of education: N/A   Occupational History  . 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 Smoking status: Former Smoker    Packs/day: 1.00    Years: 35.00    Types: Cigarettes    Quit date: 01/12/1996  . Smokeless tobacco: Never Used  . Alcohol use No  . Drug use: No  . Sexual activity: Not Asked   Other Topics Concern  . None   Social History Narrative   Married 1 son 2 daughters   Disabled   2 caffeine/day   Past smoker   11/12/2015      Family History  Problem Relation Age of Onset  . Heart disease Mother   . Liver disease Mother     alcohol related  . Asthma Brother   . Esophageal cancer Brother   . Asthma Son   . Colon cancer Neg Hx   . Stomach cancer Neg Hx   . Pancreatic cancer Neg Hx   . Inflammatory bowel disease Neg Hx    .   Review of Systems Chronic dyspnea Leg edema Objective:   Physical Exam BP 134/60 (BP Location: Left Wrist, Patient Position: Sitting, Cuff Size: Normal)   Pulse 80   Ht 5' 2.5" (1.588 m) Comment: height measured without shoes  Wt 275 lb 6 oz (124.9 kg)   BMI 49.56 kg/m  Chronically ill on O2 Lungs clear w/ diffuse dec BS Cor s1 s2 no murmur abd obese Ext 1+ bilat le edema   

## 2015-12-22 NOTE — Telephone Encounter (Signed)
[facility] [address] [place] [address]  [id number]   RE: [name] [name] DOB: [id number] MRN: [id number]   Dear Dr Kirk Ruths,    We have scheduled the above patient for an endoscopic procedure. Our records show that she is on anticoagulation therapy.   Please advise as to how long the patient may come off her therapy of Eliquis prior to the EGD/colonoscopy procedure, which is scheduled for 01/15/2016.  Please fax back/ or route the completed form to Deovion Batrez [place], [place] at [phone].   Sincerely,    Dr Silvano Rusk, [facility]

## 2015-12-22 NOTE — Patient Instructions (Addendum)
   You have been scheduled for an endoscopy and colonoscopy. Please follow the written instructions given to you at your visit today. Please pick up your prep supplies at the pharmacy within the next 1-3 days. If you use inhalers (even only as needed), please bring them with you on the day of your procedure.    You will be contaced by our office prior to your procedure for directions on holding your Eliquis.  If you do not hear from our office 1 week prior to your scheduled procedure, please call [phone] to discuss.  Please hold your iron starting 01/12/16.  I appreciate the opportunity to care for you. Silvano Rusk, MD, [facility]

## 2015-12-23 NOTE — Telephone Encounter (Signed)
Left message for patient to call me back. 

## 2015-12-23 NOTE — Telephone Encounter (Signed)
Hold apixaban 2 days prior to procedure and resume day after Brian Crenshaw  

## 2015-12-23 NOTE — Telephone Encounter (Signed)
Patient given apixaban instructions and verbalized understanding.

## 2015-12-24 ENCOUNTER — Ambulatory Visit (INDEPENDENT_AMBULATORY_CARE_PROVIDER_SITE_OTHER): Payer: BLUE CROSS/BLUE SHIELD | Admitting: Acute Care

## 2015-12-24 ENCOUNTER — Other Ambulatory Visit (INDEPENDENT_AMBULATORY_CARE_PROVIDER_SITE_OTHER): Payer: Medicare Other

## 2015-12-24 ENCOUNTER — Encounter: Payer: Self-pay | Admitting: Acute Care

## 2015-12-24 DIAGNOSIS — R05 Cough: Secondary | ICD-10-CM | POA: Diagnosis not present

## 2015-12-24 DIAGNOSIS — F329 Major depressive disorder, single episode, unspecified: Secondary | ICD-10-CM | POA: Diagnosis not present

## 2015-12-24 DIAGNOSIS — Z7901 Long term (current) use of anticoagulants: Secondary | ICD-10-CM | POA: Diagnosis not present

## 2015-12-24 DIAGNOSIS — F32A Depression, unspecified: Secondary | ICD-10-CM

## 2015-12-24 DIAGNOSIS — R059 Cough, unspecified: Secondary | ICD-10-CM

## 2015-12-24 DIAGNOSIS — D508 Other iron deficiency anemias: Secondary | ICD-10-CM

## 2015-12-24 LAB — CBC WITH DIFFERENTIAL/PLATELET
Basophils Absolute: 0 10*3/uL (ref 0.0–0.1)
Basophils Relative: 0.3 % (ref 0.0–3.0)
Eosinophils Absolute: 0.2 10*3/uL (ref 0.0–0.7)
Eosinophils Relative: 1.9 % (ref 0.0–5.0)
HCT: 33.1 % — ABNORMAL LOW (ref 36.0–46.0)
Hemoglobin: 11.1 g/dL — ABNORMAL LOW (ref 12.0–15.0)
Lymphocytes Relative: 29.2 % (ref 12.0–46.0)
Lymphs Abs: 2.6 10*3/uL (ref 0.7–4.0)
MCHC: 33.5 g/dL (ref 30.0–36.0)
MCV: 88 fl (ref 78.0–100.0)
Monocytes Absolute: 0.7 10*3/uL (ref 0.1–1.0)
Monocytes Relative: 7.9 % (ref 3.0–12.0)
Neutro Abs: 5.3 10*3/uL (ref 1.4–7.7)
Neutrophils Relative %: 60.7 % (ref 43.0–77.0)
Platelets: 267 10*3/uL (ref 150.0–400.0)
RBC: 3.76 Mil/uL — ABNORMAL LOW (ref 3.87–5.11)
RDW: 18.8 % — ABNORMAL HIGH (ref 11.5–15.5)
WBC: 8.8 10*3/uL (ref 4.0–10.5)

## 2015-12-24 LAB — FERRITIN: Ferritin: 23 ng/mL (ref 10.0–291.0)

## 2015-12-24 NOTE — Assessment & Plan Note (Signed)
Unresolved chronic cough Non-productive despite reported compliance with detailed medication plan. Plan: Please use Mucinex twice daily as prescribed. Please use the chlorpheniramine up to every 4 hours as needed for cough. Don't drive if sleepy Delsym for cough during the day. Try sugar free Jolly Ranchers to soothe your throat  Gabapentin 100 mg three times daily with meals Follow up with Dr. Melvyn Novas in 4 weeks. Please contact office for sooner follow up if symptoms do not improve or worsen or seek emergency care

## 2015-12-24 NOTE — Patient Instructions (Addendum)
Please continue to follow the medication calender [name] developed with you 12/12/2015. Please see Dr. Jenny Reichmann about your worsening depression. Please use Mucinex twice daily as prescribed. Please use the chlorpheniramine up to every 4 hours as needed for cough. Don't drive if sleepy Delsym for cough during the day. Try sugar free Jolly Ranchers to soothe your throat  Gabapentin 100 mg three times daily with meals Follow up with Dr. Melvyn Novas in 4 weeks. Please contact office for sooner follow up if symptoms do not improve or worsen or seek emergency care

## 2015-12-24 NOTE — Progress Notes (Signed)
History of Present Illness [name] [name] is a 65 y.o. female with COPD Gold II , obesity with a minimal asthmatic component documented 07/25/2006. She wears oxygen at 2.5 L 24/7.She is followed by Dr. Melvyn Novas.   12/24/2015 Follow Up : COPD/O2 RF Pt. Presents to the office today for follow up of continued cough. She was last seen in the office by [name] Parrett 12/12/2015 for med calender.She was seen prior to that on 11/27/2015  by Dr. Melvyn Novas for cough which she states she has had for 5 months on and off. It gets better for short intervals after prednisone, but then returns. She states that she had breast surgery in November and was intubated for the procedure. There is consideration that this may be secondary to direct physical injury 2/2 ETT when she had breast surgery  05/2015. She states she has no congestion. She states that her cough is non-productive and what she does get up is clear.She has been seen by an ENT. She has had a sinus CT which did not indicate a treatable problem. She states she is compliant with her Prilosec every night at bedtime.She is followed by GI. Dr. Carlean Purl. She is scheduled for an upper GI and Colonoscopy in 01/2016. She is taking Zyrtec and using her Nasonex, she is taking her chlorpheniramine, but not every 4 hours as needed. She feels she is only taking it twice daily. She is not using her Mucinex twice daily.She states she is compliant with her Symbicort daily and only uses her Xopenex nebs as needed. She has not been getting out to walk as she was advised. She appears to be very depressed.Dr. Jenny Reichmann is treating her for depression. Her husband is losing his job at the end of the month and she states she is not going to celebrate Christmas this year. Of Note, she has not coughed once since I have been in the exam room with her.She denies fever, chest pain, orthopnea or hemoptysis.  I have asked the patient to follow up with Dr. Jenny Reichmann regarding her worsening depression. She states  that he manages her medication for her depression. She said she will call and make an appointment.  Tests CXR 10/17/2015  IMPRESSION: COPD.  No evidence of pneumonia.  Stable mild cardiomegaly.   Past medical hx Past Medical History:  Diagnosis Date  . Anemia    hx  . Anxiety   . Arthritis   . Asthma   . Atrial fibrillation and flutter ([place])   . Atypical lobular hyperplasia of right breast 05/09/2015  . Breast CA ([place])    ?  Marland Kitchen CHF (congestive heart failure) ([place])   . Chronic rhinitis   . Complication of anesthesia   . COPD (chronic obstructive pulmonary disease) ([place])    Wert. PFTs 07/25/06 FEV1 55% ratio 53, DLC0 51% HFA 50% 12/16/2009  . Depression   . FRACTURE, RIB, RIGHT 06/18/2009  . Glucose intolerance (impaired glucose tolerance)    on steroids  . Heart murmur   . History of DVT (deep vein thrombosis)   . Hx of cardiac catheterization    LHC (01/2001):  Normal Cors.  EF 65%.;  LexiScan Myoview (03/2013): No ischemia, EF 61%, normal  . Hx of echocardiogram    Echo (11/2011):  Mod LVH, EF 60-65%, no RWMA, MAC, mild LAE, PASP 34.  Marland Kitchen HYPERLIPIDEMIA   . Hypertension   . Hypothyroidism   . Morbid obesity ([place])    target wt=179lb for BMI<30 Peak wt 282lb  .  Osteoporosis    last BMD 4/08 -2.4, intolerant of bisphosphonates  . Peripheral vascular disease (HCC)    hx dvt.pe  . Pneumonia    hx  . PONV (postoperative nausea and vomiting)   . PREMATURE VENTRICULAR CONTRACTIONS   . PULMONARY EMBOLISM, HX OF 06/19/2007  . Rhabdomyolysis 07/29/2009  . Shortness of breath dyspnea   . Tremor   . VITAMIN D DEFICIENCY      Past surgical hx, Family hx, Social hx all reviewed.  Current Outpatient Prescriptions on File Prior to Visit  Medication Sig  . albuterol (PROVENTIL HFA;VENTOLIN HFA) 108 (90 Base) MCG/ACT inhaler Inhale 2 puffs into the lungs every 4 (four) hours as needed for wheezing or shortness of breath.  Marland Kitchen apixaban (ELIQUIS) 5 MG TABS tablet Take 1 tablet (5 mg total)  by mouth 2 (two) times daily.  Marland Kitchen atorvastatin (LIPITOR) 10 MG tablet TAKE 1 TABLET DAILY  . budesonide-formoterol (SYMBICORT) 160-4.5 MCG/ACT inhaler Inhale 2 puffs into the lungs 2 (two) times daily.  . Calcium Carbonate-Vitamin D 600-400 MG-UNIT per tablet Take 1 tablet by mouth 2 (two) times daily.    . cetirizine (ZYRTEC) 10 MG tablet Take 1 tablet (10 mg total) by mouth at bedtime as needed for allergies.  . cholecalciferol (VITAMIN D) 1000 UNITS tablet Take 1,000 Units by mouth every morning.  Marland Kitchen Dextromethorphan-Guaifenesin (MUCINEX DM MAXIMUM STRENGTH) 60-1200 MG TB12 Take 1 tablet by mouth every 12 (twelve) hours as needed (cough/congestion).  Marland Kitchen diltiazem (CARDIZEM CD) 360 MG 24 hr capsule Take 1 capsule (360 mg total) by mouth daily.  . ferrous sulfate 325 (65 FE) MG tablet Take 325 mg by mouth 2 (two) times daily with a meal.   . FLUoxetine (PROZAC) 40 MG capsule TAKE 1 CAPSULE DAILY  . furosemide (LASIX) 80 MG tablet Take one and half (1.5) tablet (120 mg total) by mouth every morning and take one (1) tablet (80 mg total) by mouth in the afternoon.  . furosemide (LASIX) 80 MG tablet TAKE ONE AND ONE-HALF TABLETS IN THE MORNING AND 1 TABLET IN THE AFTERNOON  . glucosamine-chondroitin 500-400 MG tablet Take 1 tablet by mouth every morning.   Marland Kitchen KLOR-CON M20 20 MEQ tablet Take 1 tablet (20 mEq total) by mouth 2 (two) times daily.  Marland Kitchen levalbuterol (XOPENEX) 1.25 MG/3ML nebulizer solution Take 1.25 mg by nebulization every 4 (four) hours as needed for wheezing or shortness of breath.  . levothyroxine (SYNTHROID, LEVOTHROID) 25 MCG tablet Take 1 tablet (25 mcg total) by mouth daily before breakfast.  . mometasone (NASONEX) 50 MCG/ACT nasal spray Place 2 sprays into the nose 2 (two) times daily as needed (For nasal congestion.).  Marland Kitchen omeprazole (PRILOSEC) 20 MG capsule TAKE 2 CAPSULES DAILY  . OXYGEN Inhale 2.5 L/min into the lungs continuous.  . propafenone (RYTHMOL SR) 325 MG 12 hr capsule Take  1 capsule (325 mg total) by mouth 2 (two) times daily.  . sodium chloride (OCEAN) 0.65 % SOLN nasal spray Place 2 sprays into both nostrils 2 (two) times daily as needed for congestion.    No current facility-administered medications on file prior to visit.      Allergies  Allergen Reactions  . Duloxetine Other (See Comments)    REACTION: rhabdomyolysis  . Penicillins Other (See Comments)    SYNCOPE  . Latex Rash    Review Of Systems:  Constitutional:   No  weight loss, night sweats,  Fevers, chills, fatigue, or  lassitude.  HEENT:   No  headaches,  Difficulty swallowing,  Tooth/dental problems, or  Sore throat,                No sneezing, itching, ear ache, nasal congestion, post nasal drip,   CV:  No chest pain,  Orthopnea, PND, swelling in lower extremities, anasarca, dizziness, palpitations, syncope.   GI  No heartburn, indigestion, abdominal pain, nausea, vomiting, diarrhea, change in bowel habits, loss of appetite, bloody stools.   Resp: + shortness of breath with exertion or at rest.  No excess mucus, no productive cough,  + non-productive cough,  No coughing up of blood.  No change in color of mucus.  No wheezing.  No chest wall deformity  Skin: no rash or lesions.  GU: no dysuria, change in color of urine, no urgency or frequency.  No flank pain, no hematuria   MS:  No joint pain or swelling.  No decreased range of motion.  No back pain.  Psych:  No change in mood or affect. + depression or anxiety.  No memory loss.   Vital Signs BP 138/70   Pulse 82   Ht 5' 2.5" (1.588 m)   Wt 276 lb 9.6 oz (125.5 kg)   SpO2 96%   BMI 49.78 kg/m   Wt  274 09/01/10 >   09/29/2010  275 > 01/01/2011  280 > 04/29/2011  279 > 285 11/17/2011  > 288 02/18/2012 > 281 04/19/2012 > 03/13/2013  282 >  09/11/2013  282 >  11/26/2014  259 > 11/27/2015  273 >  12/24/15>> 276 pounds Physical Exam:  General- No distress,  A&Ox3, obese, flat affect ENT: No sinus tenderness, TM clear, pale nasal mucosa,  no oral exudate,no post nasal drip, no LAN Cardiac: S1, S2, regular rate and rhythm, no murmur Chest: No wheeze/ rales/ dullness; no accessory muscle use, no nasal flaring, no sternal retractions Abd.: Soft Non-tender Ext: No clubbing cyanosis, edema Neuro:  normal strength Skin: No rashes, warm and dry Psych: Flat affect, appears depressed   Assessment/Plan  Cough Unresolved chronic cough Non-productive despite reported compliance with detailed medication plan. Plan: Please use Mucinex twice daily as prescribed. Please use the chlorpheniramine up to every 4 hours as needed for cough. Don't drive if sleepy D[place]ym for cough during the day. Try sugar free Jolly Ranchers to soothe your throat  Gabapentin 100 mg three times daily with meals Follow up with Dr. Melvyn Novas in 4 weeks. Please contact office for sooner follow up if symptoms do not improve or worsen or seek emergency care    Depression Depression due to husband losing his job and slow to resolve cough Plan: Follow Up with Dr. Jenny Reichmann regarding worsening depression  Please contact office for sooner follow up if symptoms do not improve or worsen or seek emergency care     Magdalen Spatz, NP 12/24/2015  8:09 PM

## 2015-12-24 NOTE — Assessment & Plan Note (Signed)
Depression due to husband losing his job and slow to resolve cough Plan: Follow Up with Dr. Jenny Reichmann regarding worsening depression  Please contact office for sooner follow up if symptoms do not improve or worsen or seek emergency care

## 2015-12-25 ENCOUNTER — Encounter: Payer: Self-pay | Admitting: Internal Medicine

## 2015-12-25 DIAGNOSIS — R058 Other specified cough: Secondary | ICD-10-CM | POA: Insufficient documentation

## 2015-12-25 DIAGNOSIS — R05 Cough: Secondary | ICD-10-CM | POA: Insufficient documentation

## 2015-12-25 NOTE — Progress Notes (Signed)
Iron level better  Hgb stable Stay with plan My Chart note

## 2015-12-25 NOTE — Progress Notes (Signed)
Chart and office note reviewed in detail  > agree with a/p as outlined    

## 2016-01-07 ENCOUNTER — Encounter (HOSPITAL_COMMUNITY): Payer: Self-pay

## 2016-01-14 NOTE — Anesthesia Preprocedure Evaluation (Signed)
Anesthesia Evaluation  Patient identified by MRN, date of birth, ID band Patient awake    Reviewed: Allergy & Precautions, NPO status , Patient's Chart, lab work & pertinent test results  Airway Mallampati: II  TM Distance: >3 FB Neck ROM: Full    Dental no notable dental hx.    Pulmonary asthma , COPD, former smoker,    Pulmonary exam normal breath sounds clear to auscultation       Cardiovascular hypertension, Pt. on medications + Peripheral Vascular Disease and +CHF  Normal cardiovascular exam Rhythm:Regular Rate:Normal     Neuro/Psych negative neurological ROS  negative psych ROS   GI/Hepatic negative GI ROS, Neg liver ROS,   Endo/Other  Hypothyroidism Morbid obesity  Renal/GU negative Renal ROS  negative genitourinary   Musculoskeletal negative musculoskeletal ROS (+)   Abdominal   Peds negative pediatric ROS (+)  Hematology negative hematology ROS (+)   Anesthesia Other Findings PONV     Morbid obesity (HCC)   DVT (deep vein thrombosis)    Hypertension    Atrial fibrillation and flutter (HCC)   Anxiety    Glucose intolerance   PULMONARY EMBOLISM, HX    Hx of cardiac catheterization  LHC (01/2001): Normal Cors. EF 65%.; LexiScan Myoview (03/2013): No ischemia, EF 61%, normal Hx of echocardiogram  Echo (11/2011): Mod LVH, EF 60-65%, no RWMA, MAC, mild LAE, PASP 34. Asthma    CHF (congestive heart failure) (HCC)      Reproductive/Obstetrics negative OB ROS                             Anesthesia Physical  Anesthesia Plan  ASA: III  Anesthesia Plan: MAC   Post-op Pain Management:    Induction: Intravenous  Airway Management Planned: Mask and Natural Airway  Additional Equipment:   Intra-op Plan:   Post-operative Plan:   Informed Consent: I have reviewed the patients History and Physical, chart, labs and discussed the procedure including the risks, benefits and  alternatives for the proposed anesthesia with the patient or authorized representative who has indicated his/her understanding and acceptance.   Dental advisory given  Plan Discussed with: CRNA and Surgeon  Anesthesia Plan Comments:         Anesthesia Quick Evaluation

## 2016-01-15 ENCOUNTER — Encounter (HOSPITAL_COMMUNITY)
Admission: RE | Disposition: A | Discharge status: Home / Self Care | Payer: Self-pay | Source: Ambulatory Visit | Attending: Internal Medicine

## 2016-01-15 ENCOUNTER — Ambulatory Visit (HOSPITAL_COMMUNITY): Payer: BLUE CROSS/BLUE SHIELD | Admitting: Anesthesiology

## 2016-01-15 ENCOUNTER — Encounter (HOSPITAL_COMMUNITY): Payer: Self-pay | Admitting: *Deleted

## 2016-01-15 ENCOUNTER — Ambulatory Visit (HOSPITAL_COMMUNITY)
Admission: RE | Admit: 2016-01-15 | Discharge: 2016-01-15 | Disposition: A | Discharge status: Home / Self Care | Payer: BLUE CROSS/BLUE SHIELD | Source: Ambulatory Visit | Attending: Internal Medicine | Admitting: Internal Medicine

## 2016-01-15 DIAGNOSIS — E039 Hypothyroidism, unspecified: Secondary | ICD-10-CM | POA: Diagnosis not present

## 2016-01-15 DIAGNOSIS — Z6841 Body Mass Index (BMI) 40.0 and over, adult: Secondary | ICD-10-CM | POA: Diagnosis not present

## 2016-01-15 DIAGNOSIS — D649 Anemia, unspecified: Secondary | ICD-10-CM

## 2016-01-15 DIAGNOSIS — E785 Hyperlipidemia, unspecified: Secondary | ICD-10-CM | POA: Insufficient documentation

## 2016-01-15 DIAGNOSIS — I739 Peripheral vascular disease, unspecified: Secondary | ICD-10-CM | POA: Insufficient documentation

## 2016-01-15 DIAGNOSIS — Z86718 Personal history of other venous thrombosis and embolism: Secondary | ICD-10-CM | POA: Insufficient documentation

## 2016-01-15 DIAGNOSIS — I110 Hypertensive heart disease with heart failure: Secondary | ICD-10-CM | POA: Diagnosis not present

## 2016-01-15 DIAGNOSIS — Z86711 Personal history of pulmonary embolism: Secondary | ICD-10-CM | POA: Diagnosis not present

## 2016-01-15 DIAGNOSIS — Z7901 Long term (current) use of anticoagulants: Secondary | ICD-10-CM | POA: Insufficient documentation

## 2016-01-15 DIAGNOSIS — Z87891 Personal history of nicotine dependence: Secondary | ICD-10-CM | POA: Insufficient documentation

## 2016-01-15 DIAGNOSIS — J449 Chronic obstructive pulmonary disease, unspecified: Secondary | ICD-10-CM | POA: Diagnosis not present

## 2016-01-15 DIAGNOSIS — D509 Iron deficiency anemia, unspecified: Secondary | ICD-10-CM | POA: Diagnosis not present

## 2016-01-15 DIAGNOSIS — Z853 Personal history of malignant neoplasm of breast: Secondary | ICD-10-CM | POA: Insufficient documentation

## 2016-01-15 DIAGNOSIS — Z880 Allergy status to penicillin: Secondary | ICD-10-CM | POA: Insufficient documentation

## 2016-01-15 DIAGNOSIS — Z79899 Other long term (current) drug therapy: Secondary | ICD-10-CM | POA: Diagnosis not present

## 2016-01-15 DIAGNOSIS — K5730 Diverticulosis of large intestine without perforation or abscess without bleeding: Secondary | ICD-10-CM | POA: Insufficient documentation

## 2016-01-15 DIAGNOSIS — I4891 Unspecified atrial fibrillation: Secondary | ICD-10-CM | POA: Insufficient documentation

## 2016-01-15 DIAGNOSIS — I509 Heart failure, unspecified: Secondary | ICD-10-CM | POA: Insufficient documentation

## 2016-01-15 DIAGNOSIS — I4892 Unspecified atrial flutter: Secondary | ICD-10-CM | POA: Diagnosis not present

## 2016-01-15 DIAGNOSIS — M810 Age-related osteoporosis without current pathological fracture: Secondary | ICD-10-CM | POA: Diagnosis not present

## 2016-01-15 DIAGNOSIS — F329 Major depressive disorder, single episode, unspecified: Secondary | ICD-10-CM | POA: Diagnosis not present

## 2016-01-15 DIAGNOSIS — F419 Anxiety disorder, unspecified: Secondary | ICD-10-CM | POA: Diagnosis not present

## 2016-01-15 DIAGNOSIS — D508 Other iron deficiency anemias: Secondary | ICD-10-CM

## 2016-01-15 HISTORY — PX: COLONOSCOPY WITH PROPOFOL: SHX5780

## 2016-01-15 HISTORY — PX: ESOPHAGOGASTRODUODENOSCOPY (EGD) WITH PROPOFOL: SHX5813

## 2016-01-15 SURGERY — ESOPHAGOGASTRODUODENOSCOPY (EGD) WITH PROPOFOL
Anesthesia: Monitor Anesthesia Care

## 2016-01-15 MED ORDER — PROPOFOL 10 MG/ML IV BOLUS
INTRAVENOUS | Status: AC
  Filled 2016-01-15: qty 40

## 2016-01-15 MED ORDER — SODIUM CHLORIDE 0.9 % IV SOLN: INTRAVENOUS | Status: DC

## 2016-01-15 MED ORDER — ONDANSETRON HCL 4 MG/2ML IJ SOLN
INTRAMUSCULAR | Status: DC | PRN
  Administered 2016-01-15: 4 mg via INTRAVENOUS

## 2016-01-15 MED ORDER — DEXAMETHASONE SODIUM PHOSPHATE 10 MG/ML IJ SOLN
INTRAMUSCULAR | Status: AC
  Filled 2016-01-15: qty 1

## 2016-01-15 MED ORDER — DEXAMETHASONE SODIUM PHOSPHATE 10 MG/ML IJ SOLN
INTRAMUSCULAR | Status: DC | PRN
  Administered 2016-01-15: 10 mg via INTRAVENOUS

## 2016-01-15 MED ORDER — PROPOFOL 10 MG/ML IV BOLUS
INTRAVENOUS | Status: DC | PRN
  Administered 2016-01-15 (×17): 20 mg via INTRAVENOUS

## 2016-01-15 MED ORDER — LACTATED RINGERS IV SOLN
INTRAVENOUS | Status: DC
Start: 2016-01-15 — End: 2016-01-15
  Administered 2016-01-15: 1000 mL via INTRAVENOUS

## 2016-01-15 MED ORDER — ONDANSETRON HCL 4 MG/2ML IJ SOLN
INTRAMUSCULAR | Status: AC
  Filled 2016-01-15: qty 2

## 2016-01-15 SURGICAL SUPPLY — 24 items

## 2016-01-15 NOTE — Op Note (Signed)
[facility] Patient Name: [name] [name] Procedure Date: 01/15/2016 MRN: [id number] Attending MD: Gatha Mayer , MD Date of Birth: 01-26-1950 CSN: [id number] Age: 66 Admit Type: Outpatient Procedure:                Upper GI endoscopy Indications:              Iron deficiency anemia Providers:                Gatha Mayer, MD, Tory Emerald, RN, Otilio Saber, Technician, Harle Stanford CRNA, CRNA Referring MD:              Medicines:                Monitored Anesthesia Care Complications:            No immediate complications. Estimated Blood Loss:     Estimated blood loss: none. Procedure:                Pre-Anesthesia Assessment:                           - Prior to the procedure, a History and Physical                            was performed, and patient medications and                            allergies were reviewed. The patient's tolerance of                            previous anesthesia was also reviewed. The risks                            and benefits of the procedure and the sedation                            options and risks were discussed with the patient.                            All questions were answered, and informed consent                            was obtained. Prior Anticoagulants: The patient                            last took Eliquis (apixaban) 2 days prior to the                            procedure. ASA Grade Assessment: III - A patient                            with severe systemic disease. After reviewing the  risks and benefits, the patient was deemed in                            satisfactory condition to undergo the procedure.                           After obtaining informed consent, the endoscope was                            passed under direct vision. Throughout the                            procedure, the patient's blood pressure, pulse, and        oxygen saturations were monitored continuously. The                            [id number]) scope was introduced through the                            mouth, and advanced to the second part of duodenum.                            The upper GI endoscopy was accomplished without                            difficulty. The patient tolerated the procedure                            well. Scope In: Scope Out: Findings:      The esophagus was normal.      The stomach was normal.      The examined duodenum was normal.      The cardia and gastric fundus were normal on retroflexion. Impression:                Moderate Sedation:      N/A- Per Anesthesia Care Recommendation:           - Patient has a contact number available for                            emergencies. The signs and symptoms of potential                            delayed complications were discussed with the                            patient. Return to normal activities tomorrow.                            Written discharge instructions were provided to the                            patient.                           -  Resume previous diet.                           - Continue present medications.                           - See the other procedure note for documentation of                            additional recommendations. Procedure Code(s):        --- Professional ---                           [phone], Esophagogastroduodenoscopy, flexible,                            transoral; diagnostic, including collection of                            specimen(s) by brushing or washing, when performed                            (separate procedure) Diagnosis Code(s):        --- Professional ---                           D50.9, Iron deficiency anemia, unspecified CPT copyright 2016 American Medical Association. All rights reserved. The codes documented in this report are preliminary and upon coder review may  be revised to  meet current compliance requirements. Gatha Mayer, MD 01/15/2016 8:27:29 AM This report has been signed electronically. Number of Addenda: 0

## 2016-01-15 NOTE — H&P (View-Only) (Signed)
[name] [name] 66 y.o. [birth year] [id number]  Assessment & Plan:   Encounter Diagnoses  Name Primary?  . Other iron deficiency anemia Yes  . Chronic anticoagulation     COPD seems relatively stable as does CHF Does have a dry cough and some peripheral edema  It is appropriate to look for cause of iron-def anemia - plan for EGD/colonoscopy at hospital due to co-morbidities. Anticipate holding Eliquis 1-2 d before and will clarify w/ cardiology. She understands extra risk of thrombotic or embolic event and possible stroke off this medication.  Recheck CBC, ferritin  I appreciate the opportunity to care for you. HC:WCBJS Jenny Reichmann, MD Clois Comber, MD Kirk Ruths, MD     Subjective:   Chief Complaint: anemia, low iron  HPI  She has been taking iron since I discovered low ferritin 6 weeks ago. Stable overall except frustrated by a dry cough and going back to pulmonary. No signs of bleeding. Does eat iron rich foods  Allergies  Allergen Reactions  . Duloxetine Other (See Comments)    REACTION: rhabdomyolysis  . Penicillins Other (See Comments)    SYNCOPE  . Latex Rash   Outpatient Medications Prior to Visit  Medication Sig Dispense Refill  . albuterol (PROVENTIL HFA;VENTOLIN HFA) 108 (90 Base) MCG/ACT inhaler Inhale 2 puffs into the lungs every 4 (four) hours as needed for wheezing or shortness of breath.    Marland Kitchen apixaban (ELIQUIS) 5 MG TABS tablet Take 1 tablet (5 mg total) by mouth 2 (two) times daily. 180 tablet 3  . atorvastatin (LIPITOR) 10 MG tablet TAKE 1 TABLET DAILY 90 tablet 1  . budesonide-formoterol (SYMBICORT) 160-4.5 MCG/ACT inhaler Inhale 2 puffs into the lungs 2 (two) times daily. 3 Inhaler 2  . Calcium Carbonate-Vitamin D 600-400 MG-UNIT per tablet Take 1 tablet by mouth 2 (two) times daily.      . cetirizine (ZYRTEC) 10 MG tablet Take 1 tablet (10 mg total) by mouth at bedtime as needed for allergies. 90 tablet 3  . cholecalciferol (VITAMIN D) 1000 UNITS  tablet Take 1,000 Units by mouth every morning.    Marland Kitchen Dextromethorphan-Guaifenesin (MUCINEX DM MAXIMUM STRENGTH) 60-1200 MG TB12 Take 1 tablet by mouth every 12 (twelve) hours as needed (cough/congestion).    Marland Kitchen diltiazem (CARDIZEM CD) 360 MG 24 hr capsule Take 1 capsule (360 mg total) by mouth daily. 90 capsule 3  . FLUoxetine (PROZAC) 40 MG capsule TAKE 1 CAPSULE DAILY 90 capsule 0  . furosemide (LASIX) 80 MG tablet Take one and half (1.5) tablet (120 mg total) by mouth every morning and take one (1) tablet (80 mg total) by mouth in the afternoon.    . furosemide (LASIX) 80 MG tablet TAKE ONE AND ONE-HALF TABLETS IN THE MORNING AND 1 TABLET IN THE AFTERNOON 225 tablet 2  . glucosamine-chondroitin 500-400 MG tablet Take 1 tablet by mouth every morning.     Marland Kitchen KLOR-CON M20 20 MEQ tablet Take 1 tablet (20 mEq total) by mouth 2 (two) times daily. 180 tablet 2  . levalbuterol (XOPENEX) 1.25 MG/3ML nebulizer solution Take 1.25 mg by nebulization every 4 (four) hours as needed for wheezing or shortness of breath.    . levothyroxine (SYNTHROID, LEVOTHROID) 25 MCG tablet Take 1 tablet (25 mcg total) by mouth daily before breakfast. 90 tablet 2  . mometasone (NASONEX) 50 MCG/ACT nasal spray Place 2 sprays into the nose 2 (two) times daily as needed (For nasal congestion.). 17 g 5  . omeprazole (PRILOSEC) 20 MG  capsule TAKE 2 CAPSULES DAILY 180 capsule 2  . OXYGEN Inhale 2.5 L/min into the lungs continuous.    . propafenone (RYTHMOL SR) 325 MG 12 hr capsule Take 1 capsule (325 mg total) by mouth 2 (two) times daily. 180 capsule 3  . sodium chloride (OCEAN) 0.65 % SOLN nasal spray Place 2 sprays into both nostrils 2 (two) times daily as needed for congestion.     . Aclidinium Bromide (TUDORZA PRESSAIR) 400 MCG/ACT AEPB Inhale 1 puff into the lungs 2 (two) times daily. (Patient not taking: Reported on 12/12/2015) 3 each 1  . alendronate (FOSAMAX) 70 MG tablet Take 1 tablet (70 mg total) by mouth every 7 (seven)  days. Take with a full glass of water on an empty stomach. (Patient not taking: Reported on 12/12/2015) 4 tablet 0  . predniSONE (DELTASONE) 10 MG tablet Take  4 each am x 2 days,   2 each am x 2 days,  1 each am x 2 days and stop 14 tablet 0   No facility-administered medications prior to visit.    Past Medical History:  Diagnosis Date  . Anemia    hx  . Anxiety   . Arthritis   . Asthma   . Atrial fibrillation and flutter ([place])   . Atypical lobular hyperplasia of right breast 05/09/2015  . Breast CA ([place])    ?  Marland Kitchen CHF (congestive heart failure) ([place])   . Chronic rhinitis   . Complication of anesthesia   . COPD (chronic obstructive pulmonary disease) ([place])    [facility]. PFTs 07/25/06 FEV1 55% ratio 53, DLC0 51% HFA 50% 12/16/2009  . Depression   . FRACTURE, RIB, RIGHT 06/18/2009  . Glucose intolerance (impaired glucose tolerance)    on steroids  . Heart murmur   . History of DVT (deep vein thrombosis)   . Hx of cardiac catheterization    LHC (01/2001):  Normal Cors.  EF 65%.;  LexiScan Myoview (03/2013): No ischemia, EF 61%, normal  . Hx of echocardiogram    Echo (11/2011):  Mod LVH, EF 60-65%, no RWMA, MAC, mild LAE, PASP 34.  Marland Kitchen HYPERLIPIDEMIA   . Hypertension   . Hypothyroidism   . Morbid obesity ([place])    target wt=179lb for BMI<30 Peak wt 282lb  . Osteoporosis    last BMD 4/08 -2.4, intolerant of bisphosphonates  . Peripheral vascular disease (HCC)    hx dvt.pe  . Pneumonia    hx  . PONV (postoperative nausea and vomiting)   . PREMATURE VENTRICULAR CONTRACTIONS   . PULMONARY EMBOLISM, HX OF 06/19/2007  . Rhabdomyolysis 07/29/2009  . Shortness of breath dyspnea   . Tremor   . VITAMIN D DEFICIENCY    Past Surgical History:  Procedure Laterality Date  . ABDOMINAL HYSTERECTOMY    . BREAST LUMPECTOMY WITH RADIOACTIVE SEED LOCALIZATION Right 06/05/2015   Procedure: RIGHT BREAST LUMPECTOMY WITH RADIOACTIVE SEED LOCALIZATION;  Surgeon: Fanny Skates, MD;  Location: [place];  Service:  General;  Laterality: Right;  . BREAST SURGERY Left    s/p mass removal  . CARDIOVERSION N/A 12/11/2014   Procedure: CARDIOVERSION;  Surgeon: Josue Hector, MD;  Location[facility] ENDOSCOPY;  Service: Cardiovascular;  Laterality: N/A;  . COLONOSCOPY  10/22/2003, ?2006  . s/p left arm fracture with fall off chair    . skin graft to middle R finger  1975  . TEE WITHOUT CARDIOVERSION N/A 12/11/2014   Procedure: TRANSESOPHAGEAL ECHOCARDIOGRAM (TEE);  Surgeon: Josue Hector, MD;  Location[facility]  ENDOSCOPY;  Service: Cardiovascular;  Laterality: N/A;  . TONSILLECTOMY     Social History   Social History  . Marital status: Married    Spouse name: N/A  . Number of children: 3  . Years of education: N/A   Occupational History  . 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 Smoking status: Former Smoker    Packs/day: 1.00    Years: 35.00    Types: Cigarettes    Quit date: 01/12/1996  . Smokeless tobacco: Never Used  . Alcohol use No  . Drug use: No  . Sexual activity: Not Asked   Other Topics Concern  . None   Social History Narrative   Married 1 son 2 daughters   Disabled   2 caffeine/day   Past smoker   11/12/2015      Family History  Problem Relation Age of Onset  . Heart disease Mother   . Liver disease Mother     alcohol related  . Asthma Brother   . Esophageal cancer Brother   . Asthma Son   . Colon cancer Neg Hx   . Stomach cancer Neg Hx   . Pancreatic cancer Neg Hx   . Inflammatory bowel disease Neg Hx    .   Review of Systems Chronic dyspnea Leg edema Objective:   Physical Exam BP 134/60 (BP Location: Left Wrist, Patient Position: Sitting, Cuff Size: Normal)   Pulse 80   Ht 5' 2.5" (1.588 m) Comment: height measured without shoes  Wt 275 lb 6 oz (124.9 kg)   BMI 49.56 kg/m  Chronically ill on O2 Lungs clear w/ diffuse dec BS Cor s1 s2 no murmur abd obese Ext 1+ bilat le edema

## 2016-01-15 NOTE — Discharge Instructions (Signed)
° °  Nothing bad here - no obvious reason for iron to be low. I do not recommend any other testing but want you to take your iron (ferrous sulfate) and let Dr. Jenny Reichmann check up on that. If the iron does not fix things we can consider additional testing.  I appreciate the opportunity to care for you. Gatha Mayer, MD, FACG   YOU HAD AN ENDOSCOPIC PROCEDURE TODAY: Refer to the procedure report and other information in the discharge instructions given to you for any specific questions about what was found during the examination. If this information does not answer your questions, please call Dr. Celesta Aver office at [phone] to clarify.   YOU SHOULD EXPECT: Some feelings of bloating in the abdomen. Passage of more gas than usual. Walking can help get rid of the air that was put into your GI tract during the procedure and reduce the bloating. If you had a lower endoscopy (such as a colonoscopy or flexible sigmoidoscopy) you may notice spotting of blood in your stool or on the toilet paper. Some abdominal soreness may be present for a day or two, also.  DIET: Your first meal following the procedure should be a light meal and then it is ok to progress to your normal diet. A half-sandwich or bowl of soup is an example of a good first meal. Heavy or fried foods are harder to digest and may make you feel nauseous or bloated. Drink plenty of fluids but you should avoid alcoholic beverages for 24 hours.   ACTIVITY: Your care partner should take you home directly after the procedure. You should plan to take it easy, moving slowly for the rest of the day. You can resume normal activity the day after the procedure however YOU SHOULD NOT DRIVE, use power tools, machinery or perform tasks that involve climbing or major physical exertion for 24 hours (because of the sedation medicines used during the test).   SYMPTOMS TO REPORT IMMEDIATELY: A gastroenterologist can be reached at any hour. Please call  [phone]  for any of the following symptoms:  Following lower endoscopy (colonoscopy, flexible sigmoidoscopy) Excessive amounts of blood in the stool  Significant tenderness, worsening of abdominal pains  Swelling of the abdomen that is new, acute  Fever of 100 or higher  Following upper endoscopy (EGD, EUS, ERCP, esophageal dilation) Vomiting of blood or coffee ground material  New, significant abdominal pain  New, significant chest pain or pain under the shoulder blades  Painful or persistently difficult swallowing  New shortness of breath  Black, tarry-looking or red, bloody stools  FOLLOW UP:  If any biopsies were taken you will be contacted by phone or by letter within the next 1-3 weeks. Call [phone]  if you have not heard about the biopsies in 3 weeks.  Please also call with any specific questions about appointments or follow up tests.

## 2016-01-15 NOTE — Op Note (Signed)
[facility] Patient Name: [name] [name] Procedure Date: 01/15/2016 MRN: [id number] Attending MD: Gatha Mayer , MD Date of Birth: 05-30-1950 CSN: [id number] Age: 66 Admit Type: Outpatient Procedure:                Colonoscopy Indications:              Iron deficiency anemia Providers:                Gatha Mayer, MD, Elmer Ramp. Tilden Dome, RN, [address], Technician, Cadiz Alday CRNA, CRNA Referring MD:              Medicines:                Monitored Anesthesia Care Complications:            No immediate complications. Estimated Blood Loss:     Estimated blood loss: none. Procedure:                Pre-Anesthesia Assessment:                           - Prior to the procedure, a History and Physical                            was performed, and patient medications and                            allergies were reviewed. The patient's tolerance of                            previous anesthesia was also reviewed. The risks                            and benefits of the procedure and the sedation                            options and risks were discussed with the patient.                            All questions were answered, and informed consent                            was obtained. Prior Anticoagulants: The patient                            last took Eliquis (apixaban) 2 days prior to the                            procedure. ASA Grade Assessment: III - A patient                            with severe systemic disease. After reviewing the  risks and benefits, the patient was deemed in                            satisfactory condition to undergo the procedure.                           After obtaining informed consent, the colonoscope                            was passed under direct vision. Throughout the                            procedure, the patient's blood pressure, pulse, and         oxygen saturations were monitored continuously. The                            [id number] scope was introduced through                            the anus and advanced to the the cecum, identified                            by appendiceal orifice and ileocecal valve. The                            colonoscopy was performed without difficulty. The                            patient tolerated the procedure well. The quality                            of the bowel preparation was adequate. The                            ileocecal valve, appendiceal orifice, and rectum                            were photographed. Scope In: 7:55:31 AM Scope Out: 8:12:00 AM Scope Withdrawal Time: 0 hours 12 minutes 44 seconds  Total Procedure Duration: 0 hours 16 minutes 29 seconds  Findings:      The perianal and digital rectal examinations were normal.      Multiple small and large-mouthed diverticula were found in the sigmoid       colon.      The exam was otherwise without abnormality on direct and retroflexion       views. Impression:               - Diverticulosis in the sigmoid colon.                           - The examination was otherwise normal on direct                            and retroflexion views.                           -  No specimens collected. Moderate Sedation:      N/A- Per Anesthesia Care Recommendation:           - Patient has a contact number available for                            emergencies. The signs and symptoms of potential                            delayed complications were discussed with the                            patient. Return to normal activities tomorrow.                            Written discharge instructions were provided to the                            patient.                           - Resume previous diet.                           - Continue present medications.                           - Resume Eliquis (apixaban) at prior dose  today.                           - No repeat colonoscopy due to co-morbidities. Procedure Code(s):        --- Professional ---                           [phone], Colonoscopy, flexible; diagnostic, including                            collection of specimen(s) by brushing or washing,                            when performed (separate procedure) Diagnosis Code(s):        --- Professional ---                           D50.9, Iron deficiency anemia, unspecified                           K57.30, Diverticulosis of large intestine without                            perforation or abscess without bleeding CPT copyright 2016 American [employer] Association. All rights reserved. The codes documented in this report are preliminary and upon coder review may  be revised to meet current compliance requirements. Gatha Mayer, MD 01/15/2016 8:34:18 AM This report has been signed electronically. Number of Addenda: 0

## 2016-01-15 NOTE — Interval H&P Note (Signed)
History and Physical Interval Note:  0000000 0000000 AM  [name] [name]  has presented today for surgery, with the diagnosis of iron def. anemia  The various methods of treatment have been discussed with the patient and family. After consideration of risks, benefits and other options for treatment, the patient has consented to  Procedure(s): ESOPHAGOGASTRODUODENOSCOPY (EGD) WITH PROPOFOL (N/A) COLONOSCOPY WITH PROPOFOL (N/A) as a surgical intervention .  The patient's history has been reviewed, patient examined, no change in status, stable for surgery.  I have reviewed the patient's chart and labs.  Questions were answered to the patient's satisfaction.     Silvano Rusk

## 2016-01-15 NOTE — Anesthesia Postprocedure Evaluation (Signed)
Anesthesia Post Note  Patient: [name] [name]  Procedure(s) Performed: Procedure(s) (LRB): ESOPHAGOGASTRODUODENOSCOPY (EGD) WITH PROPOFOL (N/A) COLONOSCOPY WITH PROPOFOL (N/A)  Patient location during evaluation: PACU Anesthesia Type: MAC Level of consciousness: awake and alert Pain management: pain level controlled Vital Signs Assessment: post-procedure vital signs reviewed and stable Respiratory status: spontaneous breathing, nonlabored ventilation, respiratory function stable and patient connected to nasal cannula oxygen Cardiovascular status: stable and blood pressure returned to baseline Anesthetic complications: no        Last Vitals:  Vitals:   01/15/16 0840 01/15/16 0850  BP: (!) 131/59   Pulse: 84 85  Resp: 16 14  Temp:      Last Pain:  Vitals:   01/15/16 0818  TempSrc: Oral   Pain Goal:                 Riccardo Dubin

## 2016-01-15 NOTE — Transfer of Care (Signed)
Immediate Anesthesia Transfer of Care Note  Patient: [name] [name]  Procedure(s) Performed: Procedure(s): ESOPHAGOGASTRODUODENOSCOPY (EGD) WITH PROPOFOL (N/A) COLONOSCOPY WITH PROPOFOL (N/A)  Patient Location: PACU  Anesthesia Type:MAC  Level of Consciousness: sedated  Airway & Oxygen Therapy: Patient Spontanous Breathing and Patient connected to nasal cannula oxygen  Post-op Assessment: Report given to RN and Post -op Vital signs reviewed and stable  Post vital signs: Reviewed and stable  Last Vitals:  Vitals:   01/15/16 0650  BP: (!) 156/67  Pulse: 81  Resp: 15  Temp: 36.8 C    Last Pain:  Vitals:   01/15/16 0650  TempSrc: Oral         Complications: No apparent anesthesia complications

## 2016-01-19 ENCOUNTER — Encounter (HOSPITAL_COMMUNITY): Payer: Self-pay | Admitting: Internal Medicine

## 2016-01-19 NOTE — Addendum Note (Signed)
Addended by: Doroteo Glassman D on: 01/19/2016 04:47 PM   Modules accepted: Orders

## 2016-01-22 ENCOUNTER — Other Ambulatory Visit: Payer: Self-pay | Admitting: Internal Medicine

## 2016-01-23 ENCOUNTER — Telehealth: Payer: Self-pay | Admitting: *Deleted

## 2016-01-23 NOTE — Telephone Encounter (Signed)
PA paperwork for eliquis faxed to the number provided 

## 2016-02-02 ENCOUNTER — Encounter: Payer: Self-pay | Admitting: Internal Medicine

## 2016-02-02 ENCOUNTER — Ambulatory Visit (INDEPENDENT_AMBULATORY_CARE_PROVIDER_SITE_OTHER): Payer: BLUE CROSS/BLUE SHIELD | Admitting: Internal Medicine

## 2016-02-02 VITALS — BP 128/84 | HR 76 | Ht 62.0 in | Wt 272.0 lb

## 2016-02-02 DIAGNOSIS — J449 Chronic obstructive pulmonary disease, unspecified: Secondary | ICD-10-CM

## 2016-02-02 DIAGNOSIS — R05 Cough: Secondary | ICD-10-CM

## 2016-02-02 DIAGNOSIS — J9612 Chronic respiratory failure with hypercapnia: Secondary | ICD-10-CM

## 2016-02-02 DIAGNOSIS — J9611 Chronic respiratory failure with hypoxia: Secondary | ICD-10-CM

## 2016-02-02 DIAGNOSIS — R058 Other specified cough: Secondary | ICD-10-CM

## 2016-02-02 NOTE — Patient Instructions (Signed)
Ok to resume fosfamax but if cough becomes difficult to control again you will need to stop it  For drainage / throat tickle try take CHLORPHENIRAMINE  4 mg - take one every 4 hours as needed - available over the counter- may cause drowsiness so start with just a bedtime dose or two and see how you tolerate it before trying in daytime    See calendar for specific medication instructions and bring it back for each and every office visit for every healthcare provider you see.  Without it,  you may not receive the best quality medical care that we feel you deserve.  You will note that the calendar groups together  your maintenance  medications that are timed at particular times of the day.  Think of this as your checklist for what your doctor has instructed you to do until your next evaluation to see what benefit  there is  to staying on a consistent group of medications intended to keep you well.  The other group at the bottom is entirely up to you to use as you see fit  for specific symptoms that may arise between visits that require you to treat them on an as needed basis.  Think of this as your action plan or "what if" list.   Separating the top medications from the bottom group is fundamental to providing you adequate care going forward.     Please schedule a follow up visit in 3 months but call sooner if needed

## 2016-02-02 NOTE — Progress Notes (Signed)
Subjective:    Patient ID: [name] [name], female    DOB: 18-Feb-1950.   MRN: [id number]    Brief patient profile:  84 yowf last smoked 2003 with Morbid obesity/ COPD GOLD II  with minimum asthmatic component documented 07/25/06. On 02 24 hours per day at 3lpm       History of Present Illness  11/26/2014  f/u ov/[name] [name] re: copd/ severe obesity /uacs  - has med calendar, not really using the action plans at the bottom Chief Complaint  Patient presents with  . Follow-up    Pt c/o increased cough for the past few months non prod.  She states "feels like I am downding in fluid".     On 2-2.5  24/7 able to do [employer] with 02 in a basket  sleep ok/ sensation of pnds day > noct   rec For drainage / throat tickle try take CHLORPHENIRAMINE  4 mg - take one every 4 hours as needed See calendar for specific medication instructions   12/24/15 Cough eval NP /[place] rec Please continue to follow the medication calender [name] [name] developed with you 12/12/2015. Please see Dr. Jenny [name] about your worsening depression. Please use Mucinex twice daily as prescribed. Please use the chlorpheniramine up to every 4 hours as needed for cough. Don't drive if sleepy Delsym for cough during the day. Try sugar free Jolly Ranchers to soothe your throat  Gabapentin 100 mg three times daily with meals>  Did not start     02/02/2016  f/u ov/[name] [name] re:  Cough/ ohs /copd GOLD II   Chief Complaint  Patient presents with  . Follow-up    Breathing is back to normal baseline and she is coughing much less.     never took gabapentin / cough resolved on h1 and did not recur when she stopped it  Doe = MMRC2 = can't walk a nl [name] on a flat grade s sob but does fine slow and flat [employer] shopping and 3lpm pushing buggy   No obvious day to day or daytime variability or assoc excess/ purulent sputum or mucus plugs or hemoptysis or cp or chest tightness, subjective wheeze or overt sinus or hb symptoms. No unusual exp hx or h/o  childhood pna/ asthma or knowledge of premature birth.  Sleeping ok without nocturnal  or early am exacerbation  of respiratory  c/o's or need for noct saba. Also denies any obvious fluctuation of symptoms with weather or environmental changes or other aggravating or alleviating factors except as outlined above   Current Medications, Allergies, Complete Past Medical History, Past Surgical History, Family History, and Social History were reviewed in [employer] record.  ROS  The following are not active complaints unless bolded sore throat, dysphagia, dental problems, itching, sneezing,  nasal congestion or excess/ purulent secretions, ear ache,   fever, chills, sweats, unintended wt loss, classically pleuritic or exertional cp,  orthopnea pnd or leg swelling, presyncope, palpitations, abdominal pain, anorexia, nausea, vomiting, diarrhea  or change in bowel or bladder habits, change in stools or urine, dysuria,hematuria,  rash, arthralgias, visual complaints, headache, numbness, weakness or ataxia or problems with walking or coordination,  change in mood/affect or memory.                 Past Medical History:  CHRONIC RHINITIS (ICD-472.0)  EDEMA (ICD-782.3)  ACUTE AND CHRONIC RESPIRATORY FAILURE (ICD-518.84)  MORBID OBESITY (ICD-278.01)  - Target wt = 179 for BMI < 30 peak wt 282  -  Referred back to nutrition December 18, 2007  DEEP VENOUS THROMBOPHLEBITIS, HX OF (ICD-V12.52)  COPD (ICD-496)..........................................................Marland KitchenWert  - PFTs 07/25/06 FEV1 55% ratio 53, DLC0 51%  - HFA 50% December 16, 2009 > 75% March 27, 2010  DEPRESSION (ICD-311)  OSTEOPOROSIS (ICD-733.00) last BMD 4/08 -2.4, intolerant of bisphosphonates  HYPERTENSION (ICD-401.9)  Atrial fibrillation - paroxysmal  Anticoagulation therapy  Pulmonary embolism, hx of  neg stress myoview 10/07  Anxiety  glucose intolerance - on steroids  Hyperlipidemia  Complex medical regimen   - Med calendar done 05/01/2008, 08/26/2010 , 01/17/12 , 06/26/2013 , 12/12/2015          Objective:   Physical Exam     GEN: A/Ox3; pleasant , NAD, obese wf nad  Vital signs reviewed    -    - Note on arrival 02 sats  96% on 3lpm     Wt  274 09/01/10 >   09/29/2010  275 > 01/01/2011  280 > 04/29/2011  279 > 285 11/17/2011  > 288 02/18/2012 > 281 04/19/2012 > 03/13/2013  282 >  09/11/2013  282 >  11/26/2014  259 > 11/27/2015  273 > 02/02/2016   272    HEENT:  [place]/AT,  EACs-clear, TMs-wnl, NOSE-clear drainage, THROAT-clear, no lesions, no postnasal drip or exudate noted.   NECK:  Supple w/ fair ROM; no JVD; normal carotid impulses w/o bruits; no thyromegaly or nodules palpated; no lymphadenopathy.    RESP  Diminshed BS in bases   -no wheezes/ rales/ or rhonchi. no accessory muscle use, no dullness to percussion  CARD:  RRR, no m/r/g   - trace to 1+ sym bilateral lower ext pitting edema, pulses intact, no cyanosis or clubbing.  GI:   Soft & nt; nml bowel sounds; no organomegaly or masses detected.   Musco: Warm bil, no deformities or joint swelling noted.   Neuro: alert, no focal deficits noted, very min bilat resting tremor    Skin: Warm, no lesions or rashes

## 2016-02-02 NOTE — Telephone Encounter (Signed)
PA complete and approved through 01/22/2017.

## 2016-02-08 NOTE — Assessment & Plan Note (Addendum)
-   PFTs 07/25/06 FEV1 55% ratio 53, DLC0 51%  -  PFT's 04/19/2012 FEV1 50% (ratio 51) with 19% improvement p B2, dlco 42 > 99% corrected  - med calendar 01/17/2012  -Tudorza trial  01/17/12 > permanently d/c 11/27/2015 due to uacs   Adequate control on present rx, reviewed in detail with pt > no change in rx needed    I had an extended discussion with the patient reviewing all relevant studies completed to date and  lasting 15 to 20 minutes of a 25 minute visit    Each maintenance medication was reviewed in detail including most importantly the difference between maintenance and prns and under what circumstances the prns are to be triggered using an action plan format that is not reflected in the computer generated alphabetically organized AVS but trather by a customized med calendar that reflects the AVS meds with confirmed 100% correlation.   In addition, Please see AVS for unique instructions that I personally wrote and verbalized to the the pt in detail and then reviewed with pt  by my nurse highlighting any  changes in therapy recommended at today's visit to their plan of care.

## 2016-02-08 NOTE — Assessment & Plan Note (Addendum)
rec H1 and  neurontin 100 tid 12/24/15 >>> never took gabapentin >> better with 1st gen H1 prn  Response to h1 and gerd rx c/w uacs = Upper airway cough syndrome (previously labeled PNDS) , is  so named because it's frequently impossible to sort out how much is  CR/sinusitis with freq throat clearing (which can be related to primary GERD)   vs  causing  secondary (" extra esophageal")  GERD from wide swings in gastric pressure that occur with throat clearing, often  promoting self use of mint and menthol lozenges that reduce the lower esophageal sphincter tone and exacerbate the problem further in a cyclical fashion.   These are the same pts (now being labeled as having "irritable larynx syndrome" by some cough centers) who not infrequently have a history of having failed to tolerate ace inhibitors,  dry powder inhalers or biphosphonates or report having atypical/extraesophageal reflux symptoms that don't respond to standard doses of PPI  and are easily confused as having aecopd or asthma flares by even experienced allergists/ pulmonologists (myself included).   No additional w/u or rx needed here > ok to rechallenge with fosfamax but if cough flares strongly rec reclast IV yearly instead

## 2016-02-08 NOTE — Assessment & Plan Note (Signed)
Body mass index is 49.75 > no real change  Lab Results  Component Value Date   TSH 5.05 (H) 04/11/2015     Contributing to gerd tendency/ doe/reviewed the need and the process to achieve and maintain neg calorie balance > defer f/u primary care including intermittently monitoring thyroid status

## 2016-02-08 NOTE — Assessment & Plan Note (Signed)
-   09/29/10  Walked 3lpm  2 laps @ 185 ft each stopped due to  Sob with sats 90%     - HC03 11/08/11 = 34     - 11-17-11--o2 sat on ra at rest 83%ra     - 02/18/2012  Sat 88% RA across the room > Walked 3lpm x 3 laps @ 185 ft each stopped due to  End of study no desat     - 04/19/2012  Walked 3lpm x 3 laps @ 185 ft each stopped due to  End of study, sats dropped to 88 at very end  - HC03  34    08/01/15   Adequate control on present rx, reviewed in detail with pt > no change in rx needed  = 3lpm 24/7

## 2016-02-11 ENCOUNTER — Ambulatory Visit (INDEPENDENT_AMBULATORY_CARE_PROVIDER_SITE_OTHER): Payer: BLUE CROSS/BLUE SHIELD | Admitting: Internal Medicine

## 2016-02-11 VITALS — BP 140/80 | HR 84 | Temp 98.3°F | Resp 18 | Wt 271.0 lb

## 2016-02-11 DIAGNOSIS — R69 Illness, unspecified: Secondary | ICD-10-CM | POA: Diagnosis not present

## 2016-02-11 DIAGNOSIS — I10 Essential (primary) hypertension: Secondary | ICD-10-CM | POA: Diagnosis not present

## 2016-02-11 DIAGNOSIS — R7302 Impaired glucose tolerance (oral): Secondary | ICD-10-CM | POA: Diagnosis not present

## 2016-02-11 DIAGNOSIS — J111 Influenza due to unidentified influenza virus with other respiratory manifestations: Secondary | ICD-10-CM

## 2016-02-11 MED ORDER — OSELTAMIVIR PHOSPHATE 75 MG PO CAPS: 75.0000 mg | ORAL_CAPSULE | Freq: Two times a day (BID) | ORAL | 0 refills | Status: DC

## 2016-02-11 MED ORDER — DOXYCYCLINE HYCLATE 100 MG PO TABS: 100.0000 mg | ORAL_TABLET | Freq: Two times a day (BID) | ORAL | 0 refills | Status: DC

## 2016-02-11 MED ORDER — HYDROCODONE-HOMATROPINE 5-1.5 MG/5ML PO SYRP: 5.0000 mL | ORAL_SOLUTION | Freq: Four times a day (QID) | ORAL | 0 refills | Status: AC | PRN

## 2016-02-11 NOTE — Assessment & Plan Note (Signed)
Vs bacterial as well with greenish d/c; for tamiflu empiric, and doxy course, cough med prn,  to f/u any worsening symptoms or concerns

## 2016-02-11 NOTE — Progress Notes (Signed)
Subjective:    Patient ID: [name] [name], female    DOB: 18-Nov-1950, 66 y.o.   MRN: [id number]  HPI   Here with 2-3 days acute onset fever, myalgias, facial  pressure, headache, general weakness and malaise, and clear then greenish d/c, with mild ST and persistent almost debilitating cough, but pt denies chest pain, wheezing, increased sob or doe, orthopnea, PND, increased LE swelling, palpitations, dizziness or syncope. Does not feel "in the chest" at this point. Granddaughter tx now for influenza  Pt denies new neurological symptoms such as new headache, or facial or extremity weakness or numbness   Pt denies polydipsia, polyuria,  No other new history Past Medical History:  Diagnosis Date  . Anemia    hx  . Anxiety   . Arthritis   . Asthma   . Atrial fibrillation and flutter ([place])   . Atypical lobular hyperplasia of right breast 05/09/2015  . Breast CA ([place])    ?  Marland Kitchen CHF (congestive heart failure) ([place])   . Chronic rhinitis   . Complication of anesthesia   . COPD (chronic obstructive pulmonary disease) ([place])    Wert. PFTs 07/25/06 FEV1 55% ratio 53, DLC0 51% HFA 50% 12/16/2009  . Depression   . FRACTURE, RIB, RIGHT 06/18/2009  . Glucose intolerance (impaired glucose tolerance)    on steroids  . Heart murmur   . History of DVT (deep vein thrombosis)   . Hx of cardiac catheterization    LHC (01/2001):  Normal Cors.  EF 65%.;  LexiScan Myoview (03/2013): No ischemia, EF 61%, normal  . Hx of echocardiogram    Echo (11/2011):  Mod LVH, EF 60-65%, no RWMA, MAC, mild LAE, PASP 34.  Marland Kitchen HYPERLIPIDEMIA   . Hypertension   . Hypothyroidism   . Morbid obesity ([place])    target wt=179lb for BMI<30 Peak wt 282lb  . Osteoporosis    last BMD 4/08 -2.4, intolerant of bisphosphonates  . Peripheral vascular disease ([facility])    hx dvt.pe  . Pneumonia    hx  . PONV (postoperative nausea and vomiting)   . PREMATURE VENTRICULAR CONTRACTIONS   . PULMONARY EMBOLISM, HX OF 06/19/2007  . Rhabdomyolysis  07/29/2009  . Shortness of breath dyspnea   . Tremor   . VITAMIN D DEFICIENCY    Past Surgical History:  Procedure Laterality Date  . ABDOMINAL HYSTERECTOMY    . BREAST LUMPECTOMY WITH RADIOACTIVE SEED LOCALIZATION Right 06/05/2015   Procedure: RIGHT BREAST LUMPECTOMY WITH RADIOACTIVE SEED LOCALIZATION;  Surgeon: Fanny Skates, MD;  Location: [facility];  Service: General;  Laterality: Right;  . BREAST SURGERY Left    s/p mass removal  . CARDIOVERSION N/A 12/11/2014   Procedure: CARDIOVERSION;  Surgeon: Josue Hector, MD;  Location: [facility];  Service: Cardiovascular;  Laterality: N/A;  . COLONOSCOPY  10/22/2003, ?2006  . COLONOSCOPY WITH PROPOFOL N/A 01/15/2016   Procedure: COLONOSCOPY WITH PROPOFOL;  Surgeon: Gatha Mayer, MD;  Location: [facility];  Service: Endoscopy;  Laterality: N/A;  . ESOPHAGOGASTRODUODENOSCOPY (EGD) WITH PROPOFOL N/A 01/15/2016   Procedure: ESOPHAGOGASTRODUODENOSCOPY (EGD) WITH PROPOFOL;  Surgeon: Gatha Mayer, MD;  Location: [facility];  Service: Endoscopy;  Laterality: N/A;  . s/p left arm fracture with fall off chair    . skin graft to middle R finger  1975  . TEE WITHOUT CARDIOVERSION N/A 12/11/2014   Procedure: TRANSESOPHAGEAL ECHOCARDIOGRAM (TEE);  Surgeon: Josue Hector, MD;  Location: [place];  Service: Cardiovascular;  Laterality: N/A;  . TONSILLECTOMY  reports that she quit smoking about 20 years ago. Her smoking use included Cigarettes. She has a 35.00 pack-year smoking history. She has never used smokeless tobacco. She reports that she does not drink alcohol or use drugs. family history includes Asthma in her brother and son; Esophageal cancer in her brother; Heart disease in her mother; Liver disease in her mother. Allergies  Allergen Reactions  . Duloxetine Other (See Comments)    REACTION: rhabdomyolysis  . Penicillins Other (See Comments)    SYNCOPE Has patient had a PCN reaction causing immediate rash, facial/tongue/throat  swelling, SOB or lightheadedness with hypotension: Yes Has patient had a PCN reaction causing severe rash involving mucus membranes or skin necrosis: No Has patient had a PCN reaction that required hospitalization No Has patient had a PCN reaction occurring within the last 10 years: No If all of the above answers are "NO", then may proceed with Cephalosporin use.   . Latex Rash   Current Outpatient Prescriptions on File Prior to Visit  Medication Sig Dispense Refill  . acetaminophen (TYLENOL) 650 MG CR tablet Take 650-1,300 mg by mouth every 8 (eight) hours as needed for pain.    Marland Kitchen albuterol (PROAIR HFA) 108 (90 Base) MCG/ACT inhaler Inhale 2 puffs into the lungs every 6 (six) hours as needed for wheezing or shortness of breath.    Marland Kitchen apixaban (ELIQUIS) 5 MG TABS tablet Take 1 tablet (5 mg total) by mouth 2 (two) times daily. 180 tablet 3  . atorvastatin (LIPITOR) 10 MG tablet TAKE 1 TABLET DAILY (Patient taking differently: Take 39ms at night) 90 tablet 1  . budesonide-formoterol (SYMBICORT) 160-4.5 MCG/ACT inhaler Inhale 2 puffs into the lungs 2 (two) times daily. 3 Inhaler 2  . Calcium Carbonate-Vitamin D 600-400 MG-UNIT per tablet Take 1 tablet by mouth 2 (two) times daily.      . cetirizine (ZYRTEC) 10 MG tablet Take 1 tablet (10 mg total) by mouth at bedtime as needed for allergies. (Patient taking differently: Take 10 mg by mouth at bedtime. ) 90 tablet 3  . chlorpheniramine (CHLOR-TRIMETON) 4 MG tablet Take 4 mg by mouth every 4 (four) hours as needed for allergies.     .Marland Kitchendextromethorphan (DELSYM) 30 MG/5ML liquid as needed for cough.    . Dextromethorphan-Guaifenesin (MUCINEX DM MAXIMUM STRENGTH) 60-1200 MG TB12 Take 1 tablet by mouth every 12 (twelve) hours as needed (cough/congestion).    .Marland Kitchendiltiazem (CARDIZEM CD) 360 MG 24 hr capsule Take 1 capsule (360 mg total) by mouth daily. 90 capsule 3  . ferrous sulfate 325 (65 FE) MG tablet Take 325 mg by mouth 2 (two) times daily with a  meal.     . FLUoxetine (PROZAC) 40 MG capsule TAKE 1 CAPSULE DAILY 90 capsule 0  . fluticasone (FLONASE) 50 MCG/ACT nasal spray Place 1-2 sprays into both nostrils daily as needed for allergies or rhinitis.    . furosemide (LASIX) 80 MG tablet Take 1 extra daily as needed along with Klor Con    . furosemide (LASIX) 80 MG tablet Take 80 mg by mouth 2 (two) times daily.    .Marland Kitchenglucosamine-chondroitin 500-400 MG tablet Take 1 tablet by mouth every morning.     .Marland KitchenKLOR-CON M20 20 MEQ tablet Take 1 tablet (20 mEq total) by mouth 2 (two) times daily. 180 tablet 2  . levalbuterol (XOPENEX) 1.25 MG/3ML nebulizer solution Take 1.25 mg by nebulization every 4 (four) hours as needed for wheezing or shortness of breath.    .Marland Kitchen  levothyroxine (SYNTHROID, LEVOTHROID) 25 MCG tablet TAKE 1 TABLET DAILY BEFORE BREAKFAST 90 tablet 2  . omeprazole (PRILOSEC) 20 MG capsule TAKE 2 CAPSULES DAILY (Patient taking differently: Take 60ms before supper daily) 180 capsule 2  . OXYGEN Inhale 2.5 L/min into the lungs continuous.    .Marland Kitchenoxymetazoline (AFRIN) 0.05 % nasal spray Place 1 spray into both nostrils 2 (two) times daily as needed for congestion.    . propafenone (RYTHMOL SR) 325 MG 12 hr capsule Take 1 capsule (325 mg total) by mouth 2 (two) times daily. 180 capsule 3  . sodium chloride (OCEAN) 0.65 % SOLN nasal spray Place 2 sprays into both nostrils 2 (two) times daily as needed for congestion.      No current facility-administered medications on file prior to visit.    Review of Systems  Constitutional: Negative for unusual diaphoresis or night sweats HENT: Negative for ear swelling or discharge Eyes: Negative for worsening visual haziness  Respiratory: Negative for choking and stridor.   Gastrointestinal: Negative for distension or worsening eructation Genitourinary: Negative for retention or change in urine volume.  Musculoskeletal: Negative for other MSK pain or swelling Skin: Negative for color change and  worsening wound Neurological: Negative for tremors and numbness other than noted  Psychiatric/Behavioral: Negative for decreased concentration or agitation other than above   All other system neg per pt    Objective:   Physical Exam BP 140/80   Pulse 84   Temp 98.3 F (36.8 C) (Oral)   Resp 18   Wt 271 lb (122.9 kg)   SpO2 96%   BMI 49.57 kg/m  VS noted, obese Constitutional: Pt appears in no apparent distress HENT: Head: NCAT.  Right Ear: External ear normal.  Left Ear: External ear normal.  Bilat tm's with mild erythema.  Max sinus areas non tender.  Pharynx with mod erythema, no exudate Eyes: . Pupils are equal, round, and reactive to light. Conjunctivae and EOM are normal Neck: Normal range of motion. Neck supple. with bilat submandib LA tender Cardiovascular: Normal rate and regular rhythm.   Pulmonary/Chest: Effort normal and breath sounds decreased without rales or wheezing.  Neurological: Pt is alert. Not confused , motor grossly intact Skin: Skin is warm. No rash, no LE edema Psychiatric: Pt behavior is normal. No agitation.  No other new exam findings    Assessment & Plan:

## 2016-02-11 NOTE — Progress Notes (Signed)
Pre visit review using our clinic review tool, if applicable. No additional management support is needed unless otherwise documented below in the visit note. 

## 2016-02-11 NOTE — Assessment & Plan Note (Signed)
stable overall by history and exam, recent data reviewed with pt, and pt to continue medical treatment as before,  to f/u any worsening symptoms or concerns Lab Results  Component Value Date   HGBA1C 5.4 04/11/2015   

## 2016-02-11 NOTE — Patient Instructions (Addendum)
Please take all new medication as prescribed - the tamiflu, antibiotic, and cough medicine as needed  Please continue all other medications as before, and refills have been done if requested.  Please have the pharmacy call with any other refills you may need.  Please keep your appointments with your specialists as you may have planned

## 2016-02-11 NOTE — Assessment & Plan Note (Signed)
.  stable overall by history and exam, recent data reviewed with pt, and pt to continue medical treatment as before,  to f/u any worsening symptoms or concerns BP Readings from Last 3 Encounters:  02/11/16 140/80  02/02/16 128/84  01/15/16 (!) 131/59

## 2016-02-12 ENCOUNTER — Other Ambulatory Visit: Payer: Self-pay | Admitting: Internal Medicine

## 2016-02-25 ENCOUNTER — Encounter: Payer: Self-pay | Admitting: Internal Medicine

## 2016-02-25 NOTE — Telephone Encounter (Signed)
Ok to refill once but needs to be picked up.

## 2016-02-27 ENCOUNTER — Other Ambulatory Visit: Payer: Self-pay | Admitting: Family

## 2016-02-27 MED ORDER — HYDROCODONE-HOMATROPINE 5-1.5 MG/5ML PO SYRP: 5.0000 mL | ORAL_SOLUTION | Freq: Three times a day (TID) | ORAL | 0 refills | Status: DC | PRN

## 2016-04-08 DIAGNOSIS — N816 Rectocele: Secondary | ICD-10-CM | POA: Diagnosis not present

## 2016-04-08 DIAGNOSIS — R8299 Other abnormal findings in urine: Secondary | ICD-10-CM | POA: Diagnosis not present

## 2016-04-08 DIAGNOSIS — Z01419 Encounter for gynecological examination (general) (routine) without abnormal findings: Secondary | ICD-10-CM | POA: Diagnosis not present

## 2016-04-08 DIAGNOSIS — Z1389 Encounter for screening for other disorder: Secondary | ICD-10-CM | POA: Diagnosis not present

## 2016-04-08 DIAGNOSIS — Z130 Encounter for screening for diseases of the blood and blood-forming organs and certain disorders involving the immune mechanism: Secondary | ICD-10-CM | POA: Diagnosis not present

## 2016-04-08 DIAGNOSIS — Z6841 Body Mass Index (BMI) 40.0 and over, adult: Secondary | ICD-10-CM | POA: Diagnosis not present

## 2016-04-12 ENCOUNTER — Ambulatory Visit: Payer: BLUE CROSS/BLUE SHIELD | Admitting: Internal Medicine

## 2016-04-19 ENCOUNTER — Encounter: Payer: Self-pay | Admitting: Internal Medicine

## 2016-05-04 ENCOUNTER — Other Ambulatory Visit (INDEPENDENT_AMBULATORY_CARE_PROVIDER_SITE_OTHER): Payer: BLUE CROSS/BLUE SHIELD

## 2016-05-04 ENCOUNTER — Ambulatory Visit (INDEPENDENT_AMBULATORY_CARE_PROVIDER_SITE_OTHER): Payer: BLUE CROSS/BLUE SHIELD | Admitting: Internal Medicine

## 2016-05-04 ENCOUNTER — Encounter: Payer: Self-pay | Admitting: Internal Medicine

## 2016-05-04 VITALS — BP 118/70 | HR 88 | Ht 62.0 in | Wt 261.0 lb

## 2016-05-04 DIAGNOSIS — J9611 Chronic respiratory failure with hypoxia: Secondary | ICD-10-CM | POA: Diagnosis not present

## 2016-05-04 DIAGNOSIS — R05 Cough: Secondary | ICD-10-CM

## 2016-05-04 DIAGNOSIS — J9612 Chronic respiratory failure with hypercapnia: Secondary | ICD-10-CM | POA: Diagnosis not present

## 2016-05-04 DIAGNOSIS — J449 Chronic obstructive pulmonary disease, unspecified: Secondary | ICD-10-CM | POA: Diagnosis not present

## 2016-05-04 DIAGNOSIS — R058 Other specified cough: Secondary | ICD-10-CM

## 2016-05-04 LAB — CBC WITH DIFFERENTIAL/PLATELET
Basophils Absolute: 0.1 10*3/uL (ref 0.0–0.1)
Basophils Relative: 0.8 % (ref 0.0–3.0)
Eosinophils Absolute: 0.1 10*3/uL (ref 0.0–0.7)
Eosinophils Relative: 1.4 % (ref 0.0–5.0)
HCT: 34.3 % — ABNORMAL LOW (ref 36.0–46.0)
Hemoglobin: 12 g/dL (ref 12.0–15.0)
Lymphocytes Relative: 29.3 % (ref 12.0–46.0)
Lymphs Abs: 2.4 10*3/uL (ref 0.7–4.0)
MCHC: 34.9 g/dL (ref 30.0–36.0)
MCV: 85.3 fl (ref 78.0–100.0)
Monocytes Absolute: 0.6 10*3/uL (ref 0.1–1.0)
Monocytes Relative: 7.4 % (ref 3.0–12.0)
Neutro Abs: 5.1 10*3/uL (ref 1.4–7.7)
Neutrophils Relative %: 61.1 % (ref 43.0–77.0)
Platelets: 260 10*3/uL (ref 150.0–400.0)
RBC: 4.02 Mil/uL (ref 3.87–5.11)
RDW: 16.4 % — ABNORMAL HIGH (ref 11.5–15.5)
WBC: 8.4 10*3/uL (ref 4.0–10.5)

## 2016-05-04 MED ORDER — PREDNISONE 10 MG PO TABS: ORAL_TABLET | ORAL | 0 refills | Status: DC

## 2016-05-04 NOTE — Patient Instructions (Addendum)
Prednisone 10 mg take  4 each am x 2 days,   2 each am x 2 days,  1 each am x 2 days and stop   Please remember to go to the lab department downstairs in the basement  for your tests - we will call you with the results when they are available.  Remember for tickle/ allergy/ drainage to take chlorpheniramine up to every 4 hours during the day   When walking turn the 02 up to 4lpm on your portable concentrator   See [name] NP in 3 years  with all your medications, even over the counter meds, separated in two separate bags, the ones you take no matter what vs the ones you stop once you feel better and take only as needed when you feel you need them.  [name]  will generate for you a new user friendly medication calendar that will put [place] all on the same page re: your medication use.

## 2016-05-04 NOTE — Assessment & Plan Note (Addendum)
rec H1 and  neurontin 100 tid 12/24/15 >>> never took gabapentin >> better with 1st gen H1 prn - given ok to resume fosfamax 02/02/2016  > d/c'd 05/04/2016  - Allergy profile 05/04/2016 >  Eos 0.1 /  IgE  2 with neg RAST   Given her propensity to UACS would permanently avoid oral biphosphonates and if needed use yearly IV reclast instead   She is not using the 1st gen as much as she could > advised to follow med calendar action plan as written   For now rx Prednisone 10 mg take  4 each am x 2 days,   2 each am x 2 days,  1 each am x 2 days and stop and f/u prn or at 3 m for med calendar whichever comes first

## 2016-05-04 NOTE — Assessment & Plan Note (Addendum)
-   PFTs 07/25/06 FEV1 55% ratio 53, DLC0 51%  -  PFT's 04/19/2012 FEV1 50% (ratio 51) with 19% improvement p B2, dlco 42 > 99% corrected  - med calendar 01/17/2012  -Tudorza trial  01/17/12 > permanently d/c 11/27/2015 due to uacs   - 05/04/2016  After extensive coaching HFA effectiveness =    75%   If she improved her hfa consistently she might be able to do just as well on the symbicort 160 one bid as that's the dose she's taking anyway when she only gets half of the 2bid in correctly and this might make her tremor better or we could try her on LAMA/ ics in separate devices.  I had an extended discussion with the patient reviewing all relevant studies completed to date and  lasting 15 to 20 minutes of a 25 minute visit    Each maintenance medication was reviewed in detail including most importantly the difference between maintenance and prns and under what circumstances the prns are to be triggered using an action plan format that is not reflected in the computer generated alphabetically organized AVS but trather by a customized med calendar that reflects the AVS meds with confirmed 100% correlation.   In addition, Please see AVS for unique instructions that I personally wrote and verbalized to the the pt in detail and then reviewed with pt  by my nurse highlighting any  changes in therapy recommended at today's visit to their plan of care.

## 2016-05-04 NOTE — Progress Notes (Signed)
Subjective:    Patient ID: [name] [name], female    DOB: 02-24-1950.   MRN: [id number]    Brief patient profile:  85 yowf last smoked 2003 with Morbid obesity/ COPD GOLD II  with minimum asthmatic component documented 07/25/06. On 02 24 hours per day at 3lpm       History of Present Illness  11/26/2014  f/u ov/Shanan Fitzpatrick re: copd/ severe obesity /uacs  - has med calendar, not really using the action plans at the bottom Chief Complaint  Patient presents with  . Follow-up    Pt c/o increased cough for the past few months non prod.  She states "feels like I am downding in fluid".     On 2-2.5  24/7 able to do [employer] with 02 in a basket  sleep ok/ sensation of pnds day > noct   rec For drainage / throat tickle try take CHLORPHENIRAMINE  4 mg - take one every 4 hours as needed See calendar for specific medication instructions     12/24/15 Cough eval NP /[place] rec Please continue to follow the medication calender [name] developed with you 12/12/2015. Please see Dr. Jenny Reichmann about your worsening depression. Please use Mucinex twice daily as prescribed. Please use the chlorpheniramine up to every 4 hours as needed for cough. Don't drive if sleepy Delsym for cough during the day. Try sugar free [name] to soothe your throat  Gabapentin 100 mg three times daily with meals>  Did not start       05/04/2016  f/u ov/Yemaya Barnier re: GOLD II /  ohs  maint on symb 160 2bid  Chief Complaint  Patient presents with  . Follow-up    Pt states her breathing is good "as long as I stay inside". She is coughing less.    tremors started x one year prior to OV  Worse x 3 months but no different if misses dose of symbicort/ no need for saba at all  Cough better if sips water / worse with "allergy season" though has no obvious nasal allergy symptoms   No obvious day to day or daytime variability or assoc excess/ purulent sputum or mucus plugs or hemoptysis or cp or chest tightness, subjective wheeze or overt  sinus or hb symptoms. No unusual exp hx or h/o childhood pna/ asthma or knowledge of premature birth.  Sleeping ok without nocturnal  or early am exacerbation  of respiratory  c/o's or need for noct saba. Also denies any obvious fluctuation of symptoms with weather or environmental changes or other aggravating or alleviating factors except as outlined above   Current Medications, Allergies, Complete Past Medical History, Past Surgical History, Family History, and Social History were reviewed in [employer] record.  ROS  The following are not active complaints unless bolded sore throat, dysphagia, dental problems, itching, sneezing,  nasal congestion or excess/ purulent secretions, ear ache,   fever, chills, sweats, unintended wt loss, classically pleuritic or exertional cp,  orthopnea pnd or leg swelling, presyncope, palpitations, abdominal pain, anorexia, nausea, vomiting, diarrhea  or change in bowel or bladder habits, change in stools or urine, dysuria,hematuria,  rash, arthralgias, visual complaints, headache, numbness, weakness or ataxia or problems with walking or coordination,  change in mood/affect or memory.                     Past Medical History:  CHRONIC RHINITIS (ICD-472.0)  EDEMA (ICD-782.3)  ACUTE AND CHRONIC RESPIRATORY FAILURE (ICD-518.84)  MORBID OBESITY (  ICD-278.01)  - Target wt = 179 for BMI < 30 peak wt 282  - Referred back to nutrition December 18, 2007  DEEP VENOUS THROMBOPHLEBITIS, HX OF (ICD-V12.52)  COPD (ICD-496)..........................................................Marland KitchenWert  - PFTs 07/25/06 FEV1 55% ratio 53, DLC0 51%  - HFA 50% December 16, 2009 > 75% March 27, 2010  DEPRESSION (ICD-311)  OSTEOPOROSIS (ICD-733.00) last BMD 4/08 -2.4, intolerant of bisphosphonates  HYPERTENSION (ICD-401.9)  Atrial fibrillation - paroxysmal  Anticoagulation therapy  Pulmonary embolism, hx of  neg stress myoview 10/07  Anxiety  glucose intolerance - on  steroids  Hyperlipidemia  Complex medical regimen  - Med calendar done 05/01/2008, 08/26/2010 , 01/17/12 , 06/26/2013 , 12/12/2015          Objective:   Physical Exam     GEN: A/Ox3; pleasant , NAD, obese wf nad incessant coughing/ harsh upper airway pattern    Vital signs reviewed    -    - Note on arrival 02 sats  86% on RA   Wt  274 09/01/10 >   09/29/2010  275 > 01/01/2011  280 > 04/29/2011  279 > 285 11/17/2011  > 288 02/18/2012 > 281 04/19/2012 > 03/13/2013  282 >  09/11/2013  282 >  11/26/2014  259 > 11/27/2015  273 > 02/02/2016   272 > 05/04/2016   261   HEENT:  [place]/AT,  EACs-clear, TMs-wnl, NOSE-clear drainage, THROAT-clear, no lesions, no postnasal drip or exudate noted.   NECK:  Supple w/ fair ROM; no JVD; normal carotid impulses w/o bruits; no thyromegaly or nodules palpated; no lymphadenopathy.    RESP  Diminshed BS in bases   -no wheezes/ rales/ or rhonchi. no accessory muscle use, no dullness to percussion  CARD:  RRR, no m/r/g   - trace  sym bilateral lower ext pitting edema, pulses intact, no cyanosis or clubbing.  GI:   Soft & nt; nml bowel sounds; no organomegaly or masses detected.   Musco: Warm bil, no deformities or joint swelling noted.   Neuro: alert, no focal deficits noted, very min bilat resting tremor    Skin: Warm, no lesions or rashes    Labs ordered 05/04/2016  Allergy profile

## 2016-05-05 LAB — RESPIRATORY ALLERGY PROFILE REGION II ~~LOC~~
Allergen, A. alternata, m6: <0.1 kU/L
Allergen, C. Herbarum, M2: <0.1 kU/L
Allergen, Cedar tree, t12: <0.1 kU/L
Allergen, Comm Silver Birch, t9: <0.1 kU/L
Allergen, Cottonwood, t14: <0.1 kU/L
Allergen, D pternoyssinus,d7: <0.1 kU/L
Allergen, Mouse Urine Protein, e78: <0.1 kU/L
Allergen, Mulberry, t76: <0.1 kU/L
Allergen, Oak,t7: <0.1 kU/L
Allergen, P. notatum, m1: <0.1 kU/L
Aspergillus fumigatus, m3: <0.1 kU/L
Bermuda Grass: <0.1 kU/L
Box Elder IgE: <0.1 kU/L
Cat Dander: <0.1 kU/L
Cockroach: <0.1 kU/L
Common Ragweed: <0.1 kU/L
D. farinae: <0.1 kU/L
Dog Dander: <0.1 kU/L
Elm IgE: <0.1 kU/L
IgE (Immunoglobulin E), Serum: <2 kU/L (ref <115)
Johnson Grass: <0.1 kU/L
Pecan/Hickory Tree IgE: <0.1 kU/L
Rough Pigweed  IgE: <0.1 kU/L
Sheep Sorrel IgE: <0.1 kU/L
Timothy Grass: <0.1 kU/L

## 2016-05-06 ENCOUNTER — Encounter: Payer: Self-pay | Admitting: Internal Medicine

## 2016-05-06 NOTE — Telephone Encounter (Signed)
Dr Melvyn Novas, I was in the office on the 24th , you put me on Prednisone10 mg , 4 for 2 dys, 72for 2 dys,and 1 for 2dys. I took my first dose on the 25th, I have not been able to sleep at all ,I am still awack today no sleeping coming , I feel restlist , keeping a headache on the left side of my head ,don'twant to go away ,just looking to let you know whatsgoing on with me Mrs [name] [name] [phone] thank you4/26/18  Please respond to the above email thanks

## 2016-05-06 NOTE — Assessment & Plan Note (Signed)
-   09/29/10  Walked 3lpm  2 laps @ 185 ft each stopped due to  Sob with sats 90%     - HC03 11/08/11 = 34     - 11-17-11--o2 sat on ra at rest 83%ra     - 02/18/2012  Sat 88% RA across the room > Walked 3lpm x 3 laps @ 185 ft each stopped due to  End of study no desat     - 04/19/2012  Walked 3lpm x 3 laps @ 185 ft each stopped due to  End of study, sats dropped to 88 at very end  - HC03  34    08/01/15  - 05/04/2016 Patient Saturations on Room Air at Rest = 86% Patient Saturations on 4 Liters of oxygen while Ambulating = 90% Please briefly explain why patient needs home oxygen: Pt oxygen drops while walking.   As of 05/04/2016 rec 2lpm at rest and 4lpm walking

## 2016-05-06 NOTE — Telephone Encounter (Signed)
Dr Melvyn Novas replied to the e-mail himself. Nothing further needed.

## 2016-05-06 NOTE — Assessment & Plan Note (Signed)
Body mass index is 47.74 kg/m.  -  trending down/ encouraged Lab Results  Component Value Date   TSH 5.05 (H) 04/11/2015     Contributing to gerd risk/ doe/reviewed the need and the process to achieve and maintain neg calorie balance > defer f/u primary care including intermittently monitoring thyroid status

## 2016-05-06 NOTE — Progress Notes (Signed)
Spoke with pt and notified of results per Dr. Wert. Pt verbalized understanding and denied any questions. 

## 2016-05-11 ENCOUNTER — Other Ambulatory Visit: Payer: Self-pay | Admitting: Cardiology

## 2016-05-11 NOTE — Telephone Encounter (Signed)
Rx request sent to pharmacy.  

## 2016-05-21 ENCOUNTER — Other Ambulatory Visit: Payer: Self-pay | Admitting: Internal Medicine

## 2016-06-09 ENCOUNTER — Ambulatory Visit (INDEPENDENT_AMBULATORY_CARE_PROVIDER_SITE_OTHER): Payer: BLUE CROSS/BLUE SHIELD | Admitting: Internal Medicine

## 2016-06-09 ENCOUNTER — Encounter: Payer: Self-pay | Admitting: Internal Medicine

## 2016-06-09 VITALS — BP 162/90 | HR 90 | Ht 64.0 in | Wt 266.0 lb

## 2016-06-09 DIAGNOSIS — J309 Allergic rhinitis, unspecified: Secondary | ICD-10-CM

## 2016-06-09 DIAGNOSIS — I10 Essential (primary) hypertension: Secondary | ICD-10-CM

## 2016-06-09 DIAGNOSIS — R7302 Impaired glucose tolerance (oral): Secondary | ICD-10-CM | POA: Diagnosis not present

## 2016-06-09 DIAGNOSIS — J449 Chronic obstructive pulmonary disease, unspecified: Secondary | ICD-10-CM

## 2016-06-09 MED ORDER — PREDNISONE 10 MG PO TABS: ORAL_TABLET | ORAL | 0 refills | Status: DC

## 2016-06-09 MED ORDER — METHYLPREDNISOLONE ACETATE 80 MG/ML IJ SUSP
80.0000 mg | Freq: Once | INTRAMUSCULAR | Status: AC
  Administered 2016-06-09: 80 mg via INTRAMUSCULAR

## 2016-06-09 NOTE — Assessment & Plan Note (Signed)
Mild elevated today, likely reactive, to cont current med tx, pt declines any med changes

## 2016-06-09 NOTE — Patient Instructions (Signed)
You had the steroid shot today  Please take all new medication as prescribed - the prednisone  Please continue all other medications as before, and refills have been done if requested.  Please have the pharmacy call with any other refills you may need  Please keep your appointments with your specialists as you may have planned     

## 2016-06-09 NOTE — Addendum Note (Signed)
Addended by: Juliet Rude on: 06/09/2016 11:43 AM   Modules accepted: Orders

## 2016-06-09 NOTE — Progress Notes (Signed)
Subjective:    Patient ID: [name] [name], female    DOB: Jul 17, 1950, 66 y.o.   MRN: [id number]  HPI  Here to f/u - Does have several wks ongoing nasal allergy symptoms with clearish congestion, itch and sneezing, ear pain and nasal gtt, scant prod cough without fever, pain, ST, swelling or wheezing.  Pt denies chest pain, increased sob or doe, wheezing, orthopnea, PND, increased LE swelling, palpitations, dizziness or syncope.  Recent prednisone helped with nasal symptoms but now returned.  Overall mild but constant, mucinex DM not working well for cough. Overall good compliance with treatment, and good medicine tolerability, including the ocean spary, Chlor-trimeton, and flonase.   Pt denies polydipsia,  Past Medical History:  Diagnosis Date  . Anemia    hx  . Anxiety   . Arthritis   . Asthma   . Atrial fibrillation and flutter ([place])   . Atypical lobular hyperplasia of right breast 05/09/2015  . Breast CA ([place])    ?  Marland Kitchen CHF (congestive heart failure) ([place])   . Chronic rhinitis   . Complication of anesthesia   . COPD (chronic obstructive pulmonary disease) ([place])    [place]ert. PFTs 07/25/06 FEV1 55% ratio 53, DLC0 51% HFA 50% 12/16/2009  . Depression   . FRACTURE, RIB, RIGHT 06/18/2009  . Glucose intolerance (impaired glucose tolerance)    on steroids  . Heart murmur   . History of DVT (deep vein thrombosis)   . Hx of cardiac catheterization    LHC (01/2001):  Normal Cors.  EF 65%.;  LexiScan Myoview (03/2013): No ischemia, EF 61%, normal  . Hx of echocardiogram    Echo (11/2011):  Mod LVH, EF 60-65%, no RWMA, MAC, mild LAE, PASP 34.  Marland Kitchen HYPERLIPIDEMIA   . Hypertension   . Hypothyroidism   . Morbid obesity ([place])    target wt=179lb for BMI<30 Peak wt 282lb  . Osteoporosis    last BMD 4/08 -2.4, intolerant of bisphosphonates  . Peripheral vascular disease (HCC)    hx dvt.pe  . Pneumonia    hx  . PONV (postoperative nausea and vomiting)   . PREMATURE VENTRICULAR CONTRACTIONS   .  PULMONARY EMBOLISM, HX OF 06/19/2007  . Rhabdomyolysis 07/29/2009  . Shortness of breath dyspnea   . Tremor   . VITAMIN D DEFICIENCY    Past Surgical History:  Procedure Laterality Date  . ABDOMINAL HYSTERECTOMY    . BREAST LUMPECTOMY WITH RADIOACTIVE SEED LOCALIZATION Right 06/05/2015   Procedure: RIGHT BREAST LUMPECTOMY WITH RADIOACTIVE SEED LOCALIZATION;  Surgeon: Fanny Skates, MD;  Location: [place];  Service: General;  Laterality: Right;  . BREAST SURGERY Left    s/p mass removal  . CARDIOVERSION N/A 12/11/2014   Procedure: CARDIOVERSION;  Surgeon: Josue Hector, MD;  Location: [place];  Service: Cardiovascular;  Laterality: N/A;  . COLONOSCOPY  10/22/2003, ?2006  . COLONOSCOPY WITH PROPOFOL N/A 01/15/2016   Procedure: COLONOSCOPY WITH PROPOFOL;  Surgeon: Gatha Mayer, MD;  [facility];  Service: Endoscopy;  Laterality: N/A;  . ESOPHAGOGASTRODUODENOSCOPY (EGD) WITH PROPOFOL N/A 01/15/2016   Procedure: ESOPHAGOGASTRODUODENOSCOPY (EGD) WITH PROPOFOL;  Surgeon: Gatha Mayer, MD;  [facility];  Service: Endoscopy;  Laterality: N/A;  . s/p left arm fracture with fall off chair    . skin graft to middle R finger  1975  . TEE WITHOUT CARDIOVERSION N/A 12/11/2014   Procedure: TRANSESOPHAGEAL ECHOCARDIOGRAM (TEE);  Surgeon: Josue Hector, MD;  Location: [place];  Service: Cardiovascular;  Laterality: N/A;  . TONSILLECTOMY      reports that she quit smoking about 20 years ago. Her smoking use included Cigarettes. She has a 35.00 pack-year smoking history. She has never used smokeless tobacco. She reports that she does not drink alcohol or use drugs. family history includes Asthma in her brother and son; Esophageal cancer in her brother; Heart disease in her mother; Liver disease in her mother. Allergies  Allergen Reactions  . Duloxetine Other (See Comments)    REACTION: rhabdomyolysis  . Penicillins Other (See Comments)    SYNCOPE Has patient had a PCN  reaction causing immediate rash, facial/tongue/throat swelling, SOB or lightheadedness with hypotension: Yes Has patient had a PCN reaction causing severe rash involving mucus membranes or skin necrosis: No Has patient had a PCN reaction that required hospitalization No Has patient had a PCN reaction occurring within the last 10 years: No If all of the above answers are "NO", then may proceed with Cephalosporin use.   . Latex Rash   Current Outpatient Prescriptions on File Prior to Visit  Medication Sig Dispense Refill  . acetaminophen (TYLENOL) 650 MG CR tablet Take 650-1,300 mg by mouth every 8 (eight) hours as needed for pain.    Marland Kitchen albuterol (PROAIR HFA) 108 (90 Base) MCG/ACT inhaler Inhale 2 puffs into the lungs every 6 (six) hours as needed for wheezing or shortness of breath.    Marland Kitchen apixaban (ELIQUIS) 5 MG TABS tablet Take 1 tablet (5 mg total) by mouth 2 (two) times daily. 180 tablet 3  . atorvastatin (LIPITOR) 10 MG tablet TAKE 1 TABLET DAILY 90 tablet 0  . budesonide-formoterol (SYMBICORT) 160-4.5 MCG/ACT inhaler Inhale 2 puffs into the lungs 2 (two) times daily. 3 Inhaler 2  . Calcium Carbonate-Vitamin D 600-400 MG-UNIT per tablet Take 1 tablet by mouth 2 (two) times daily.      . cetirizine (ZYRTEC) 10 MG tablet Take 1 tablet (10 mg total) by mouth at bedtime as needed for allergies. (Patient taking differently: Take 10 mg by mouth at bedtime. ) 90 tablet 3  . chlorpheniramine (CHLOR-TRIMETON) 4 MG tablet Take 4 mg by mouth every 4 (four) hours as needed for allergies.     . Dextromethorphan-Guaifenesin (MUCINEX DM MAXIMUM STRENGTH) 60-1200 MG TB12 Take 1 tablet by mouth every 12 (twelve) hours as needed (cough/congestion).    Marland Kitchen diltiazem (CARDIZEM CD) 360 MG 24 hr capsule Take 1 capsule (360 mg total) by mouth daily. 90 capsule 3  . ferrous sulfate 325 (65 FE) MG tablet Take 325 mg by mouth 2 (two) times daily with a meal.     . FLUoxetine (PROZAC) 40 MG capsule TAKE 1 CAPSULE DAILY 90  capsule 0  . fluticasone (FLONASE) 50 MCG/ACT nasal spray Place 1-2 sprays into both nostrils daily as needed for allergies or rhinitis.    . furosemide (LASIX) 80 MG tablet Take 1 extra daily as needed along with Klor Con    . glucosamine-chondroitin 500-400 MG tablet Take 1 tablet by mouth every morning.     Marland Kitchen KLOR-CON M20 20 MEQ tablet Take 1 tablet (20 mEq total) by mouth 2 (two) times daily. Please schedule appointment for refills. 180 tablet 0  . levalbuterol (XOPENEX) 1.25 MG/3ML nebulizer solution Take 1.25 mg by nebulization every 4 (four) hours as needed for wheezing or shortness of breath.    . levothyroxine (SYNTHROID, LEVOTHROID) 25 MCG tablet TAKE 1 TABLET DAILY BEFORE BREAKFAST 90 tablet 2  . Olopatadine HCl (PAZEO) 0.7 % SOLN  Apply 1 drop to eye daily.    Marland Kitchen omeprazole (PRILOSEC) 20 MG capsule TAKE 2 CAPSULES DAILY (Patient taking differently: Take 51ms before supper daily) 180 capsule 2  . OXYGEN Inhale 2.5 L/min into the lungs continuous.    .Marland Kitchenoxymetazoline (AFRIN) 0.05 % nasal spray Place 1 spray into both nostrils 2 (two) times daily as needed for congestion.    . propafenone (RYTHMOL SR) 325 MG 12 hr capsule Take 1 capsule (325 mg total) by mouth 2 (two) times daily. 180 capsule 3  . sodium chloride (OCEAN) 0.65 % SOLN nasal spray Place 2 sprays into both nostrils 2 (two) times daily as needed for congestion.      No current facility-administered medications on file prior to visit.    Review of Systems  Constitutional: Negative for other unusual diaphoresis or sweats HENT: Negative for ear discharge or swelling Eyes: Negative for other worsening visual disturbances Respiratory: Negative for stridor or other swelling  Gastrointestinal: Negative for worsening distension or other blood Genitourinary: Negative for retention or other urinary change Musculoskeletal: Negative for other MSK pain or swelling Skin: Negative for color change or other new lesions Neurological:  Negative for worsening tremors and other numbness  Psychiatric/Behavioral: Negative for worsening agitation or other fatigue All other system neg per pt    Objective:   Physical Exam BP (!) 162/90   Pulse 90   Ht 5' 4"  (1.626 m)   Wt 266 lb (120.7 kg)   SpO2 96%   BMI 45.66 kg/m  VS noted, not ill appaering Constitutional: Pt appears in NAD HENT: Head: NCAT.  Right Ear: External ear normal.  Left Ear: External ear normal.  Bilat tm's with mild erythema.  Max sinus areas non tender.  Pharynx with mild erythema, no exudate Eyes: . Pupils are equal, round, and reactive to light. Conjunctivae and EOM are normal Nose: without d/c or deformity Neck: Neck supple. Gross normal ROM Cardiovascular: Normal rate and regular rhythm.   Pulmonary/Chest: Effort normal and breath sounds decreased but without rales or wheezing.  Neurological: Pt is alert. At baseline orientation, motor grossly intact Skin: Skin is warm. No rashes, other new lesions, no LE edema Psychiatric: Pt behavior is normal without agitation , mild nervous only No other exam findings       Assessment & Plan:

## 2016-06-09 NOTE — Assessment & Plan Note (Signed)
stable overall by history and exam, recent data reviewed with pt, and pt to continue medical treatment as before,  to f/u any worsening symptoms or concerns Lab Results  Component Value Date   HGBA1C 5.4 04/11/2015  pt to call for onset polys or cbg > 200 with steroid tx

## 2016-06-09 NOTE — Assessment & Plan Note (Signed)
Mild to mod kikely seasonal flare, for depomedrol IM 80, predpac asd,  Restart flonase asd, to f/u any worsening symptoms or concerns

## 2016-06-09 NOTE — Assessment & Plan Note (Signed)
stable overall by history and exam, and pt to continue medical treatment as before,  to f/u any worsening symptoms or concerns 

## 2016-06-21 ENCOUNTER — Telehealth: Payer: Self-pay | Admitting: Internal Medicine

## 2016-06-21 MED ORDER — ALBUTEROL SULFATE HFA 108 (90 BASE) MCG/ACT IN AERS: 2.0000 | INHALATION_SPRAY | Freq: Four times a day (QID) | RESPIRATORY_TRACT | 1 refills | Status: DC | PRN

## 2016-06-21 NOTE — Telephone Encounter (Signed)
Spoke with pt. She needs a refill on Proair sent to [facility]. Rx has been sent in. Nothing further was needed.

## 2016-06-22 ENCOUNTER — Other Ambulatory Visit: Payer: Self-pay | Admitting: Internal Medicine

## 2016-06-22 NOTE — Telephone Encounter (Signed)
Done erx 

## 2016-07-03 ENCOUNTER — Other Ambulatory Visit: Payer: Self-pay | Admitting: Internal Medicine

## 2016-07-03 ENCOUNTER — Other Ambulatory Visit: Payer: Self-pay | Admitting: Cardiology

## 2016-07-05 MED ORDER — ATORVASTATIN CALCIUM 10 MG PO TABS: 10.0000 mg | ORAL_TABLET | Freq: Every day | ORAL | 2 refills | Status: DC

## 2016-07-17 ENCOUNTER — Other Ambulatory Visit: Payer: Self-pay | Admitting: Cardiology

## 2016-07-17 DIAGNOSIS — I4892 Unspecified atrial flutter: Secondary | ICD-10-CM

## 2016-07-27 ENCOUNTER — Ambulatory Visit (INDEPENDENT_AMBULATORY_CARE_PROVIDER_SITE_OTHER): Payer: BLUE CROSS/BLUE SHIELD | Admitting: Internal Medicine

## 2016-07-27 VITALS — BP 142/88 | HR 100 | Ht 64.0 in | Wt 265.0 lb

## 2016-07-27 DIAGNOSIS — Z23 Encounter for immunization: Secondary | ICD-10-CM | POA: Diagnosis not present

## 2016-07-27 DIAGNOSIS — Z0000 Encounter for general adult medical examination without abnormal findings: Secondary | ICD-10-CM

## 2016-07-27 DIAGNOSIS — J302 Other seasonal allergic rhinitis: Secondary | ICD-10-CM

## 2016-07-27 DIAGNOSIS — H1013 Acute atopic conjunctivitis, bilateral: Secondary | ICD-10-CM

## 2016-07-27 DIAGNOSIS — I10 Essential (primary) hypertension: Secondary | ICD-10-CM

## 2016-07-27 DIAGNOSIS — R7302 Impaired glucose tolerance (oral): Secondary | ICD-10-CM | POA: Diagnosis not present

## 2016-07-27 MED ORDER — METHYLPREDNISOLONE ACETATE 80 MG/ML IJ SUSP
80.0000 mg | Freq: Once | INTRAMUSCULAR | Status: AC
  Administered 2016-07-27: 80 mg via INTRAMUSCULAR

## 2016-07-27 MED ORDER — OLOPATADINE HCL 0.7 % OP SOLN: 1.0000 [drp] | Freq: Every day | OPHTHALMIC | 3 refills | Status: DC

## 2016-07-27 MED ORDER — AZELASTINE HCL 0.1 % NA SOLN: 2.0000 | Freq: Two times a day (BID) | NASAL | 3 refills | Status: DC

## 2016-07-27 NOTE — Progress Notes (Signed)
Subjective:    Patient ID: [name] [name], female    DOB: 02/10/1950, 66 y.o.   MRN: [id number]  HPI  Here to f/u, c/o worsening allergy symptoms to eyes and nasal with increased nonprod cough; Does have several wks ongoing nasal and eye allergy symptoms with clearish congestion, itch and sneezing, without fever, pain, ST, cough, swelling or wheezing. Pt denies chest pain, increased sob or doe, wheezing, orthopnea, PND, increased LE swelling, palpitations, dizziness or syncope.  Pt denies fever, wt loss, night sweats, loss of appetite, or other constitutional symptoms   Pt denies polydipsia, polyuria  Incidentally due for pneumovax Past Medical History:  Diagnosis Date  . Anemia    hx  . Anxiety   . Arthritis   . Asthma   . Atrial fibrillation and flutter ([place])   . Atypical lobular hyperplasia of right breast 05/09/2015  . Breast CA ([place])    ?  Marland Kitchen CHF (congestive heart failure) ([place])   . Chronic rhinitis   . Complication of anesthesia   . COPD (chronic obstructive pulmonary disease) ([place])    [facility]. PFTs 07/25/06 FEV1 55% ratio 53, DLC0 51% HFA 50% 12/16/2009  . Depression   . FRACTURE, RIB, RIGHT 06/18/2009  . Glucose intolerance (impaired glucose tolerance)    on steroids  . Heart murmur   . History of DVT (deep vein thrombosis)   . Hx of cardiac catheterization    LHC (01/2001):  Normal Cors.  EF 65%.;  LexiScan Myoview (03/2013): No ischemia, EF 61%, normal  . Hx of echocardiogram    Echo (11/2011):  Mod LVH, EF 60-65%, no RWMA, MAC, mild LAE, PASP 34.  Marland Kitchen HYPERLIPIDEMIA   . Hypertension   . Hypothyroidism   . Morbid obesity ([place])    target wt=179lb for BMI<30 Peak wt 282lb  . Osteoporosis    last BMD 4/08 -2.4, intolerant of bisphosphonates  . Peripheral vascular disease (HCC)    hx dvt.pe  . Pneumonia    hx  . PONV (postoperative nausea and vomiting)   . PREMATURE VENTRICULAR CONTRACTIONS   . PULMONARY EMBOLISM, HX OF 06/19/2007  . Rhabdomyolysis 07/29/2009  . Shortness of  breath dyspnea   . Tremor   . VITAMIN D DEFICIENCY    Past Surgical History:  Procedure Laterality Date  . ABDOMINAL HYSTERECTOMY    . BREAST LUMPECTOMY WITH RADIOACTIVE SEED LOCALIZATION Right 06/05/2015   Procedure: RIGHT BREAST LUMPECTOMY WITH RADIOACTIVE SEED LOCALIZATION;  Surgeon: Fanny Skates, MD;  Location: [facility];  Service: General;  Laterality: Right;  . BREAST SURGERY Left    s/p mass removal  . CARDIOVERSION N/A 12/11/2014   Procedure: CARDIOVERSION;  Surgeon: Josue Hector, MD;  Location: [place];  Service: Cardiovascular;  Laterality: N/A;  . COLONOSCOPY  10/22/2003, ?2006  . COLONOSCOPY WITH PROPOFOL N/A 01/15/2016   Procedure: COLONOSCOPY WITH PROPOFOL;  Surgeon: Gatha Mayer, MD;  [facility];  Service: Endoscopy;  Laterality: N/A;  . ESOPHAGOGASTRODUODENOSCOPY (EGD) WITH PROPOFOL N/A 01/15/2016   Procedure: ESOPHAGOGASTRODUODENOSCOPY (EGD) WITH PROPOFOL;  Surgeon: Gatha Mayer, MD;  [facility];  Service: Endoscopy;  Laterality: N/A;  . s/p left arm fracture with fall off chair    . skin graft to middle R finger  1975  . TEE WITHOUT CARDIOVERSION N/A 12/11/2014   Procedure: TRANSESOPHAGEAL ECHOCARDIOGRAM (TEE);  Surgeon: Josue Hector, MD;  Location: [facility] END[facility];  Service: Cardiovascular;  Laterality: N/A;  . TONSILLECTOMY      reports that she  quit smoking about 20 years ago. Her smoking use included Cigarettes. She has a 35.00 pack-year smoking history. She has never used smokeless tobacco. She reports that she does not drink alcohol or use drugs. family history includes Asthma in her brother and son; Esophageal cancer in her brother; Heart disease in her mother; Liver disease in her mother. Allergies  Allergen Reactions  . Duloxetine Other (See Comments)    REACTION: rhabdomyolysis  . Penicillins Other (See Comments)    SYNCOPE Has patient had a PCN reaction causing immediate rash, facial/tongue/throat swelling, SOB or lightheadedness  with hypotension: Yes Has patient had a PCN reaction causing severe rash involving mucus membranes or skin necrosis: No Has patient had a PCN reaction that required hospitalization No Has patient had a PCN reaction occurring within the last 10 years: No If all of the above answers are "NO", then may proceed with Cephalosporin use.   . Latex Rash   Current Outpatient Prescriptions on File Prior to Visit  Medication Sig Dispense Refill  . acetaminophen (TYLENOL) 650 MG CR tablet Take 650-1,300 mg by mouth every 8 (eight) hours as needed for pain.    Marland Kitchen albuterol (PROAIR HFA) 108 (90 Base) MCG/ACT inhaler Inhale 2 puffs into the lungs every 6 (six) hours as needed for wheezing or shortness of breath. 3 Inhaler 1  . atorvastatin (LIPITOR) 10 MG tablet Take 1 tablet (10 mg total) by mouth daily. 90 tablet 2  . budesonide-formoterol (SYMBICORT) 160-4.5 MCG/ACT inhaler Inhale 2 puffs into the lungs 2 (two) times daily. 3 Inhaler 2  . Calcium Carbonate-Vitamin D 600-400 MG-UNIT per tablet Take 1 tablet by mouth 2 (two) times daily.      . cetirizine (ZYRTEC) 10 MG tablet Take 1 tablet (10 mg total) by mouth at bedtime as needed for allergies. (Patient taking differently: Take 10 mg by mouth at bedtime. ) 90 tablet 3  . chlorpheniramine (CHLOR-TRIMETON) 4 MG tablet Take 4 mg by mouth every 4 (four) hours as needed for allergies.     . Dextromethorphan-Guaifenesin (MUCINEX DM MAXIMUM STRENGTH) 60-1200 MG TB12 Take 1 tablet by mouth every 12 (twelve) hours as needed (cough/congestion).    Marland Kitchen diltiazem (CARDIZEM CD) 360 MG 24 hr capsule Take 1 capsule (360 mg total) by mouth daily. 90 capsule 3  . ELIQUIS 5 MG TABS tablet TAKE 1 TABLET TWICE A DAY 180 tablet 0  . ferrous sulfate 325 (65 FE) MG tablet Take 325 mg by mouth 2 (two) times daily with a meal.     . FLUoxetine (PROZAC) 40 MG capsule TAKE 1 CAPSULE DAILY 90 capsule 3  . furosemide (LASIX) 80 MG tablet Take 1 extra daily as needed along with Klor Con     . glucosamine-chondroitin 500-400 MG tablet Take 1 tablet by mouth every morning.     Marland Kitchen KLOR-CON M20 20 MEQ tablet Take 1 tablet (20 mEq total) by mouth 2 (two) times daily. Please schedule appointment for refills. 180 tablet 0  . levalbuterol (XOPENEX) 1.25 MG/3ML nebulizer solution Take 1.25 mg by nebulization every 4 (four) hours as needed for wheezing or shortness of breath.    . levothyroxine (SYNTHROID, LEVOTHROID) 25 MCG tablet TAKE 1 TABLET DAILY BEFORE BREAKFAST 90 tablet 2  . omeprazole (PRILOSEC) 20 MG capsule TAKE 2 CAPSULES DAILY (Patient taking differently: Take 32ms before supper daily) 180 capsule 2  . OXYGEN Inhale 2.5 L/min into the lungs continuous.    .Marland Kitchenoxymetazoline (AFRIN) 0.05 % nasal spray Place 1 spray  into both nostrils 2 (two) times daily as needed for congestion.    . propafenone (RYTHMOL SR) 325 MG 12 hr capsule Take 1 capsule (325 mg total) by mouth 2 (two) times daily. 180 capsule 3  . sodium chloride (OCEAN) 0.65 % SOLN nasal spray Place 2 sprays into both nostrils 2 (two) times daily as needed for congestion.      No current facility-administered medications on file prior to visit.    Review of Systems  Constitutional: Negative for other unusual diaphoresis or sweats HENT: Negative for ear discharge or swelling Eyes: Negative for other worsening visual disturbances Respiratory: Negative for stridor or other swelling  Gastrointestinal: Negative for worsening distension or other blood Genitourinary: Negative for retention or other urinary change Musculoskeletal: Negative for other MSK pain or swelling Skin: Negative for color change or other new lesions Neurological: Negative for worsening tremors and other numbness  Psychiatric/Behavioral: Negative for worsening agitation or other fatigue All other system neg per pt    Objective:   Physical Exam BP (!) 142/88   Pulse 100   Ht _0  (1.626 m)   Wt 265 lb (120.2 kg)   SpO2 95%   BMI 45.49 kg/m  VS  noted, not ill appearing, wearing home o2 Constitutional: Pt appears in NAD HENT: Head: NCAT.  Right Ear: External ear normal.  Left Ear: External ear normal.  Eyes: . Pupils are equal, round, and reactive to light. Conjunctivae with bilat weepy clearish d/c, and EOM are normal Bilat tm's with mild erythema.  Max sinus areas non tender.  Pharynx with mild erythema, no exudate Nose: without d/c or deformity Neck: Neck supple. Gross normal ROM Cardiovascular: Normal rate and regular rhythm.   Pulmonary/Chest: Effort normal and breath sounds decreased without rales or wheezing.  Neurological: Pt is alert. At baseline orientation, motor grossly intact Skin: Skin is warm. No rashes, other new lesions, no LE edema Psychiatric: Pt behavior is normal without agitation  No other exam findings    Assessment & Plan:

## 2016-07-27 NOTE — Patient Instructions (Addendum)
You had the Pneumovax pneumonia shot today  You had the steroid shot today  OK for astelin nasal as prescribed  Please continue all other medications as before, and refills have been done if requested - the eye drops  Please have the pharmacy call with any other refills you may need.  Please continue your efforts at being more active, low cholesterol diet, and weight control.  Please keep your appointments with your specialists as you may have planned  Please return in 3 months, or sooner if needed, with Lab testing done 3-5 days before

## 2016-07-28 DIAGNOSIS — H1010 Acute atopic conjunctivitis, unspecified eye: Secondary | ICD-10-CM | POA: Insufficient documentation

## 2016-07-28 NOTE — Assessment & Plan Note (Signed)
Mild to mod, for patanol asd,  to f/u any worsening symptoms or concerns

## 2016-07-28 NOTE — Assessment & Plan Note (Signed)
Mild to mod, for depomedrol IM 80, astelin nasal, to f/u any worsening symptoms or concerns

## 2016-07-28 NOTE — Assessment & Plan Note (Signed)
stable overall by history and exam, recent data reviewed with pt, and pt to continue medical treatment as before,  to f/u any worsening symptoms or concerns Lab Results  Component Value Date   HGBA1C 5.4 04/11/2015   

## 2016-07-28 NOTE — Assessment & Plan Note (Signed)
stable overall by history and exam, recent data reviewed with pt, and pt to continue medical treatment as before,  to f/u any worsening symptoms or concerns BP Readings from Last 3 Encounters:  07/27/16 (!) 142/88  06/09/16 (!) 162/90  05/04/16 118/70

## 2016-08-03 ENCOUNTER — Ambulatory Visit (INDEPENDENT_AMBULATORY_CARE_PROVIDER_SITE_OTHER): Payer: BLUE CROSS/BLUE SHIELD | Admitting: Adult Health

## 2016-08-03 ENCOUNTER — Encounter: Payer: Self-pay | Admitting: Adult Health

## 2016-08-03 ENCOUNTER — Ambulatory Visit (INDEPENDENT_AMBULATORY_CARE_PROVIDER_SITE_OTHER)
Admission: RE | Admit: 2016-08-03 | Discharge: 2016-08-03 | Disposition: A | Discharge status: Home / Self Care | Payer: BLUE CROSS/BLUE SHIELD | Source: Ambulatory Visit | Attending: Adult Health | Admitting: Adult Health

## 2016-08-03 VITALS — BP 112/62 | HR 85 | Ht 62.0 in | Wt 261.2 lb

## 2016-08-03 DIAGNOSIS — J9612 Chronic respiratory failure with hypercapnia: Secondary | ICD-10-CM | POA: Diagnosis not present

## 2016-08-03 DIAGNOSIS — J449 Chronic obstructive pulmonary disease, unspecified: Secondary | ICD-10-CM

## 2016-08-03 DIAGNOSIS — J9611 Chronic respiratory failure with hypoxia: Secondary | ICD-10-CM

## 2016-08-03 MED ORDER — PREDNISONE 10 MG PO TABS: ORAL_TABLET | ORAL | 0 refills | Status: DC

## 2016-08-03 NOTE — Progress Notes (Signed)
<[id number]<\/[id number]>_[id number]  ID: [name] [name], female    DOB: 1950-07-21, 66 y.o.   MRN: [id number]  Chief Complaint  Patient presents with  . Follow-up    COPD     Referring provider: Biagio Borg, MD  HPI: 66 yo female former smoker followed for COPD GOLD III with minimal asthmatic component and O2 RF   TEST  PFTs 07/25/06 FEV1 55% ratio 53, DLC0 51%  -  PFT's 04/19/2012 FEV1 50% (ratio 51) with 19% improvement p B2, dlco 42 > 99% corrected   08/03/2016 Acute OV COPD  Patient presents for an acute office visit. She complains over the last 4 days, that she's had increased cough that is mainly dry and wheezing. She's had watery itchy eyes and nasal drainage. Patient denies any fever or discolored mucus, orthopnea, PND, or increased leg swelling. She remains on Symbicort twice daily.   Complains of  Allergies  Allergen Reactions  . Duloxetine Other (See Comments)    REACTION: rhabdomyolysis  . Penicillins Other (See Comments)    SYNCOPE Has patient had a PCN reaction causing immediate rash, facial/tongue/throat swelling, SOB or lightheadedness with hypotension: Yes Has patient had a PCN reaction causing severe rash involving mucus membranes or skin necrosis: No Has patient had a PCN reaction that required hospitalization No Has patient had a PCN reaction occurring within the last 10 years: No If all of the above answers are "NO", then may proceed with Cephalosporin use.   . Latex Rash    Immunization History  Administered Date(s) Administered  . Influenza Split 10/14/2010, 10/13/2011  . Influenza Whole 10/02/2007, 10/14/2008, 10/16/2009  . Influenza, Seasonal, Injecte, Preservative Fre 11/01/2013  . Influenza,inj,Quad PF,36+ Mos 10/17/2012, 10/17/2015  . Pneumococcal Conjugate-13 10/17/2012, 01/03/2013  . Pneumococcal Polysaccharide-23 09/12/2006, 07/27/2016  . Td 12/05/2007  . Zoster 10/14/2010    Past Medical History:  Diagnosis Date  . Anemia    hx  . Anxiety   . Arthritis   .  Asthma   . Atrial fibrillation and flutter ([place])   . Atypical lobular hyperplasia of right breast 05/09/2015  . Breast CA ([place])    ?  Marland Kitchen CHF (congestive heart failure) ([place])   . Chronic rhinitis   . Complication of anesthesia   . COPD (chronic obstructive pulmonary disease) ([place])    [place]. PFTs 07/25/06 FEV1 55% ratio 53, DLC0 51% HFA 50% 12/16/2009  . Depression   . FRACTURE, RIB, RIGHT 06/18/2009  . Glucose intolerance (impaired glucose tolerance)    on steroids  . Heart murmur   . History of DVT (deep vein thrombosis)   . Hx of cardiac catheterization    LHC (01/2001):  Normal Cors.  EF 65%.;  LexiScan Myoview (03/2013): No ischemia, EF 61%, normal  . Hx of echocardiogram    Echo (11/2011):  Mod LVH, EF 60-65%, no RWMA, MAC, mild LAE, PASP 34.  Marland Kitchen HYPERLIPIDEMIA   . Hypertension   . Hypothyroidism   . Morbid obesity ([place])    target wt=179lb for BMI<30 Peak wt 282lb  . Osteoporosis    last BMD 4/08 -2.4, intolerant of bisphosphonates  . Peripheral vascular disease ([facility])    hx dvt.pe  . Pneumonia    hx  . PONV (postoperative nausea and vomiting)   . PREMATURE VENTRICULAR CONTRACTIONS   . PULMONARY EMBOLISM, HX OF 06/19/2007  . Rhabdomyolysis 07/29/2009  . Shortness of breath dyspnea   . Tremor   . VITAMIN D DEFICIENCY     Tobacco History: History  Smoking Status  . Former Smoker  . Packs/day: 1.00  . Years: 35.00  . Types: Cigarettes  . Quit date: 01/12/1996  Smokeless Tobacco  . Never Used   Counseling given: Not Answered   Outpatient Encounter Prescriptions as of 08/03/2016  Medication Sig  . acetaminophen (TYLENOL) 650 MG CR tablet Take 650-1,300 mg by mouth every 8 (eight) hours as needed for pain.  Marland Kitchen albuterol (PROAIR HFA) 108 (90 Base) MCG/ACT inhaler Inhale 2 puffs into the lungs every 6 (six) hours as needed for wheezing or shortness of breath.  Marland Kitchen atorvastatin (LIPITOR) 10 MG tablet Take 1 tablet (10 mg total) by mouth daily.  Marland Kitchen azelastine (ASTELIN) 0.1 % nasal  spray Place 2 sprays into both nostrils 2 (two) times daily. Use in each nostril as directed  . budesonide-formoterol (SYMBICORT) 160-4.5 MCG/ACT inhaler Inhale 2 puffs into the lungs 2 (two) times daily.  . Calcium Carbonate-Vitamin D 600-400 MG-UNIT per tablet Take 1 tablet by mouth 2 (two) times daily.    . cetirizine (ZYRTEC) 10 MG tablet Take 1 tablet (10 mg total) by mouth at bedtime as needed for allergies. (Patient taking differently: Take 10 mg by mouth at bedtime. )  . chlorpheniramine (CHLOR-TRIMETON) 4 MG tablet Take 4 mg by mouth every 4 (four) hours as needed for allergies.   . Dextromethorphan-Guaifenesin (MUCINEX DM MAXIMUM STRENGTH) 60-1200 MG TB12 Take 1 tablet by mouth every 12 (twelve) hours as needed (cough/congestion).  Marland Kitchen diltiazem (CARDIZEM CD) 360 MG 24 hr capsule Take 1 capsule (360 mg total) by mouth daily.  Marland Kitchen ELIQUIS 5 MG TABS tablet TAKE 1 TABLET TWICE A DAY  . ferrous sulfate 325 (65 FE) MG tablet Take 325 mg by mouth 2 (two) times daily with a meal.   . FLUoxetine (PROZAC) 40 MG capsule TAKE 1 CAPSULE DAILY  . furosemide (LASIX) 80 MG tablet Take 1 extra daily as needed along with Klor Con  . glucosamine-chondroitin 500-400 MG tablet Take 1 tablet by mouth every morning.   Marland Kitchen KLOR-CON M20 20 MEQ tablet Take 1 tablet (20 mEq total) by mouth 2 (two) times daily. Please schedule appointment for refills.  Marland Kitchen levalbuterol (XOPENEX) 1.25 MG/3ML nebulizer solution Take 1.25 mg by nebulization every 4 (four) hours as needed for wheezing or shortness of breath.  . levothyroxine (SYNTHROID, LEVOTHROID) 25 MCG tablet TAKE 1 TABLET DAILY BEFORE BREAKFAST  . Olopatadine HCl (PAZEO) 0.7 % SOLN Apply 1 drop to eye daily.  Marland Kitchen omeprazole (PRILOSEC) 20 MG capsule TAKE 2 CAPSULES DAILY (Patient taking differently: Take 48ms before supper daily)  . OXYGEN Inhale 2.5 L/min into the lungs continuous.  .Marland Kitchenoxymetazoline (AFRIN) 0.05 % nasal spray Place 1 spray into both nostrils 2 (two) times  daily as needed for congestion.  . propafenone (RYTHMOL SR) 325 MG 12 hr capsule Take 1 capsule (325 mg total) by mouth 2 (two) times daily.  . sodium chloride (OCEAN) 0.65 % SOLN nasal spray Place 2 sprays into both nostrils 2 (two) times daily as needed for congestion.   . predniSONE (DELTASONE) 10 MG tablet 4 tabs for 2 days, then 3 tabs for 2 days, 2 tabs for 2 days, then 1 tab for 2 days, then stop   No facility-administered encounter medications on file as of 08/03/2016.      Review of Systems  Constitutional:   No  weight loss, night sweats,  Fevers, chills, + fatigue, or  lassitude.  HEENT:   No headaches,  Difficulty swallowing,  Tooth/dental  problems, or  Sore throat,                No sneezing, itching, ear ache, + nasal congestion, post nasal drip,   CV:  No chest pain,  Orthopnea, PND, swelling in lower extremities, anasarca, dizziness, palpitations, syncope.   GI  No heartburn, indigestion, abdominal pain, nausea, vomiting, diarrhea, change in bowel habits, loss of appetite, bloody stools.   Resp:  No wheezing.  No chest wall deformity  Skin: no rash or lesions.  GU: no dysuria, change in color of urine, no urgency or frequency.  No flank pain, no hematuria   MS:  No joint pain or swelling.  No decreased range of motion.  No back pain.    Physical Exam  BP 112/62 (BP Location: Left Arm, Cuff Size: Large)   Pulse 85   Ht _0  (1.575 m)   Wt 261 lb 3.2 oz (118.5 kg)   SpO2 95%   BMI 47.77 kg/m   GEN: A/Ox3; pleasant , NAD, obese  On O2    HEENT:  [place]/AT,  EACs-clear, TMs-wnl, NOSE-clear, THROAT-clear, no lesions, no postnasal drip or exudate noted.   NECK:  Supple w/ fair ROM; no JVD; normal carotid impulses w/o bruits; no thyromegaly or nodules palpated; no lymphadenopathy.    RESP  Decreased bs in bases ; w/o, wheezes/ rales/ or rhonchi. no accessory muscle use, no dullness to percussion  CARD:  RRR, no m/r/g, no peripheral edema, pulses intact, no  cyanosis or clubbing.  GI:   Soft & nt; nml bowel sounds; no organomegaly or masses detected.   Musco: Warm bil, no deformities or joint swelling noted.   Neuro: alert, no focal deficits noted.    Skin: Warm, no lesions or rashes    Lab Results:  CBC   BNP Imaging: Dg Chest 2 View  Result Date: 08/03/2016 CLINICAL DATA:  Chronic obstructive pulmonary disease. EXAM: CHEST  2 VIEW COMPARISON:  Radiographs of October 17, 2015. FINDINGS: Stable cardiomediastinal silhouette. No pneumothorax or pleural effusion is noted. No acute pulmonary disease is noted. Bony thorax is unremarkable. IMPRESSION: No active cardiopulmonary disease. Electronically Signed   By: Marijo Conception, M.D.   On: 08/03/2016 13:08     Assessment & Plan:   COPD GOLD II Flare  Defer abx at this time  Check cxr   Plan  Patient Instructions  Chest xray today  Prednisone taper over next week.  Mucinex and Delsym As needed  Cough .  Flonase and Chlortrimeton As needed  Drainage.  follow up Dr. Melvyn Novas  In 3 months and As needed   Please contact office for sooner follow up if symptoms do not improve or worsen or seek emergency care       Chronic respiratory failure with hypoxia and hypercapnia ([place]) Cont on O2      [name] Parrett, NP 08/03/2016

## 2016-08-03 NOTE — Patient Instructions (Addendum)
Chest xray today  Prednisone taper over next week.  Mucinex and Delsym As needed  Cough .  Flonase and Chlortrimeton As needed  Drainage.  follow up Dr. Melvyn Novas  In 3 months and As needed   Please contact office for sooner follow up if symptoms do not improve or worsen or seek emergency care

## 2016-08-03 NOTE — Assessment & Plan Note (Signed)
Cont on O2 .  

## 2016-08-03 NOTE — Assessment & Plan Note (Signed)
Flare  Defer abx at this time  Check cxr   Plan  Patient Instructions  Chest xray today  Prednisone taper over next week.  Mucinex and Delsym As needed  Cough .  Flonase and Chlortrimeton As needed  Drainage.  follow up Dr. Melvyn Novas  In 3 months and As needed   Please contact office for sooner follow up if symptoms do not improve or worsen or seek emergency care

## 2016-08-04 ENCOUNTER — Telehealth: Payer: Self-pay | Admitting: Cardiology

## 2016-08-04 MED ORDER — KLOR-CON M20 20 MEQ PO TBCR: 20.0000 meq | EXTENDED_RELEASE_TABLET | Freq: Two times a day (BID) | ORAL | 0 refills | Status: DC

## 2016-08-04 NOTE — Telephone Encounter (Signed)
°*  STAT* If patient is at the pharmacy, call can be transferred to refill team.   1. Which medications need to be refilled? (please list name of each medication and dose if known) need new prescription for Klor-Con  2. Which pharmacy/location (including street and city if local pharmacy) is medication to be sent to?[facility]  3. Do they need a 30 day or 90 day supply? 90 and refills

## 2016-08-04 NOTE — Telephone Encounter (Signed)
Rx has been sent to the pharmacy electronically. ° °

## 2016-08-04 NOTE — Progress Notes (Signed)
Chart and office note reviewed in detail  > agree with a/p as outlined    

## 2016-08-05 ENCOUNTER — Telehealth: Payer: Self-pay | Admitting: Internal Medicine

## 2016-08-05 MED ORDER — TRAMADOL HCL 50 MG PO TABS: 50.0000 mg | ORAL_TABLET | ORAL | 0 refills | Status: DC | PRN

## 2016-08-05 NOTE — Telephone Encounter (Signed)
Prednisone would be very effective if this ere an allergy so rec  for cough mucinex dm 1200 mg every 12 hours and add tramadol 50 mg 1-2 every 4 hours as needed #40 and use the flutter valve if she has one

## 2016-08-05 NOTE — Telephone Encounter (Signed)
Spoke with pt. She is aware of MW's recommendations. Rx has been called in for Tramadol. Nothing further was needed.

## 2016-08-05 NOTE — Telephone Encounter (Signed)
MW  Please Advise- [facility] staff offered pt to come in for an appointment but she declined and she is requesting something to be called in. She states she is still on the prednisone that TP placed her on, she has 4 days left. She is c/o on going severe cough, but unable to produce any mucus. She states the cough makes her chest hurts. She thinks it might be because her of her allergies. She states she is currently taking mucinex, delsym,  Denies wheezing,fever,chest tightness. She states TP did a chest xray but she has not received the results.   Allergies  Allergen Reactions  . Duloxetine Other (See Comments)    REACTION: rhabdomyolysis  . Penicillins Other (See Comments)    SYNCOPE Has patient had a PCN reaction causing immediate rash, facial/tongue/throat swelling, SOB or lightheadedness with hypotension: Yes Has patient had a PCN reaction causing severe rash involving mucus membranes or skin necrosis: No Has patient had a PCN reaction that required hospitalization No Has patient had a PCN reaction occurring within the last 10 years: No If all of the above answers are "NO", then may proceed with Cephalosporin use.   . Latex Rash   COPD GOLD II - Parrett, [name] S, NP at 08/03/2016 2:19 PM   Status: Written  Related Problem: COPD GOLD II    Flare  Defer abx at this time  Check cxr   Plan  Patient Instructions  Chest xray today  Prednisone taper over next week.  Mucinex and Delsym As needed  Cough .  Flonase and Chlortrimeton As needed  Drainage.  follow up Dr. Melvyn Novas  In 3 months and As needed   Please contact office for sooner follow up if symptoms do not improve or worsen or seek emergency care

## 2016-08-20 NOTE — Addendum Note (Signed)
Addended by: Parke Poisson E on: 08/20/2016 12:00 PM   Modules accepted: Orders

## 2016-09-13 ENCOUNTER — Other Ambulatory Visit: Payer: Self-pay | Admitting: Cardiology

## 2016-09-14 ENCOUNTER — Encounter (HOSPITAL_COMMUNITY): Payer: Self-pay | Admitting: Emergency Medicine

## 2016-09-14 ENCOUNTER — Emergency Department (HOSPITAL_COMMUNITY): Payer: BLUE CROSS/BLUE SHIELD

## 2016-09-14 ENCOUNTER — Encounter: Payer: Self-pay | Admitting: Internal Medicine

## 2016-09-14 ENCOUNTER — Inpatient Hospital Stay (HOSPITAL_COMMUNITY)
Admission: EM | Admit: 2016-09-14 | Discharge: 2016-09-15 | DRG: 308 | Disposition: A | Discharge status: Home / Self Care | Payer: BLUE CROSS/BLUE SHIELD | Attending: Internal Medicine | Admitting: Internal Medicine

## 2016-09-14 ENCOUNTER — Ambulatory Visit (INDEPENDENT_AMBULATORY_CARE_PROVIDER_SITE_OTHER): Payer: BLUE CROSS/BLUE SHIELD | Admitting: Internal Medicine

## 2016-09-14 ENCOUNTER — Other Ambulatory Visit: Payer: Self-pay

## 2016-09-14 ENCOUNTER — Other Ambulatory Visit (HOSPITAL_COMMUNITY): Payer: Self-pay

## 2016-09-14 VITALS — BP 138/82 | HR 120 | Ht 64.0 in | Wt 257.0 lb

## 2016-09-14 DIAGNOSIS — J069 Acute upper respiratory infection, unspecified: Secondary | ICD-10-CM | POA: Diagnosis not present

## 2016-09-14 DIAGNOSIS — Z87891 Personal history of nicotine dependence: Secondary | ICD-10-CM

## 2016-09-14 DIAGNOSIS — J441 Chronic obstructive pulmonary disease with (acute) exacerbation: Secondary | ICD-10-CM | POA: Diagnosis present

## 2016-09-14 DIAGNOSIS — I480 Paroxysmal atrial fibrillation: Secondary | ICD-10-CM | POA: Diagnosis not present

## 2016-09-14 DIAGNOSIS — M1990 Unspecified osteoarthritis, unspecified site: Secondary | ICD-10-CM | POA: Diagnosis present

## 2016-09-14 DIAGNOSIS — E785 Hyperlipidemia, unspecified: Secondary | ICD-10-CM | POA: Diagnosis present

## 2016-09-14 DIAGNOSIS — I739 Peripheral vascular disease, unspecified: Secondary | ICD-10-CM | POA: Diagnosis present

## 2016-09-14 DIAGNOSIS — Z800 Family history of malignant neoplasm of digestive organs: Secondary | ICD-10-CM

## 2016-09-14 DIAGNOSIS — E559 Vitamin D deficiency, unspecified: Secondary | ICD-10-CM | POA: Diagnosis present

## 2016-09-14 DIAGNOSIS — Z91040 Latex allergy status: Secondary | ICD-10-CM

## 2016-09-14 DIAGNOSIS — J9621 Acute and chronic respiratory failure with hypoxia: Secondary | ICD-10-CM | POA: Diagnosis not present

## 2016-09-14 DIAGNOSIS — Z86718 Personal history of other venous thrombosis and embolism: Secondary | ICD-10-CM

## 2016-09-14 DIAGNOSIS — J449 Chronic obstructive pulmonary disease, unspecified: Secondary | ICD-10-CM

## 2016-09-14 DIAGNOSIS — I5032 Chronic diastolic (congestive) heart failure: Secondary | ICD-10-CM | POA: Diagnosis not present

## 2016-09-14 DIAGNOSIS — Z86711 Personal history of pulmonary embolism: Secondary | ICD-10-CM | POA: Diagnosis not present

## 2016-09-14 DIAGNOSIS — E039 Hypothyroidism, unspecified: Secondary | ICD-10-CM | POA: Diagnosis present

## 2016-09-14 DIAGNOSIS — Z7901 Long term (current) use of anticoagulants: Secondary | ICD-10-CM | POA: Diagnosis not present

## 2016-09-14 DIAGNOSIS — Z6841 Body Mass Index (BMI) 40.0 and over, adult: Secondary | ICD-10-CM | POA: Diagnosis not present

## 2016-09-14 DIAGNOSIS — Z8249 Family history of ischemic heart disease and other diseases of the circulatory system: Secondary | ICD-10-CM

## 2016-09-14 DIAGNOSIS — J310 Chronic rhinitis: Secondary | ICD-10-CM | POA: Diagnosis present

## 2016-09-14 DIAGNOSIS — M810 Age-related osteoporosis without current pathological fracture: Secondary | ICD-10-CM | POA: Diagnosis present

## 2016-09-14 DIAGNOSIS — Z888 Allergy status to other drugs, medicaments and biological substances status: Secondary | ICD-10-CM | POA: Diagnosis not present

## 2016-09-14 DIAGNOSIS — F411 Generalized anxiety disorder: Secondary | ICD-10-CM | POA: Diagnosis present

## 2016-09-14 DIAGNOSIS — F32A Depression, unspecified: Secondary | ICD-10-CM | POA: Diagnosis present

## 2016-09-14 DIAGNOSIS — Z9981 Dependence on supplemental oxygen: Secondary | ICD-10-CM

## 2016-09-14 DIAGNOSIS — J9622 Acute and chronic respiratory failure with hypercapnia: Secondary | ICD-10-CM | POA: Diagnosis present

## 2016-09-14 DIAGNOSIS — I4892 Unspecified atrial flutter: Secondary | ICD-10-CM | POA: Diagnosis present

## 2016-09-14 DIAGNOSIS — Z923 Personal history of irradiation: Secondary | ICD-10-CM

## 2016-09-14 DIAGNOSIS — R000 Tachycardia, unspecified: Secondary | ICD-10-CM

## 2016-09-14 DIAGNOSIS — Z8672 Personal history of thrombophlebitis: Secondary | ICD-10-CM

## 2016-09-14 DIAGNOSIS — I10 Essential (primary) hypertension: Secondary | ICD-10-CM | POA: Diagnosis not present

## 2016-09-14 DIAGNOSIS — I110 Hypertensive heart disease with heart failure: Secondary | ICD-10-CM | POA: Diagnosis present

## 2016-09-14 DIAGNOSIS — Z90710 Acquired absence of both cervix and uterus: Secondary | ICD-10-CM | POA: Diagnosis not present

## 2016-09-14 DIAGNOSIS — J9612 Chronic respiratory failure with hypercapnia: Secondary | ICD-10-CM

## 2016-09-14 DIAGNOSIS — I4891 Unspecified atrial fibrillation: Secondary | ICD-10-CM

## 2016-09-14 DIAGNOSIS — I509 Heart failure, unspecified: Secondary | ICD-10-CM | POA: Diagnosis not present

## 2016-09-14 DIAGNOSIS — J9611 Chronic respiratory failure with hypoxia: Secondary | ICD-10-CM | POA: Diagnosis present

## 2016-09-14 DIAGNOSIS — R0602 Shortness of breath: Secondary | ICD-10-CM | POA: Diagnosis not present

## 2016-09-14 DIAGNOSIS — Z7951 Long term (current) use of inhaled steroids: Secondary | ICD-10-CM

## 2016-09-14 DIAGNOSIS — D649 Anemia, unspecified: Secondary | ICD-10-CM | POA: Diagnosis present

## 2016-09-14 DIAGNOSIS — Z853 Personal history of malignant neoplasm of breast: Secondary | ICD-10-CM | POA: Diagnosis not present

## 2016-09-14 DIAGNOSIS — Z880 Allergy status to penicillin: Secondary | ICD-10-CM | POA: Diagnosis not present

## 2016-09-14 DIAGNOSIS — Z825 Family history of asthma and other chronic lower respiratory diseases: Secondary | ICD-10-CM

## 2016-09-14 DIAGNOSIS — F329 Major depressive disorder, single episode, unspecified: Secondary | ICD-10-CM | POA: Diagnosis present

## 2016-09-14 LAB — BRAIN NATRIURETIC PEPTIDE: B Natriuretic Peptide: 98.4 pg/mL (ref 0.0–100.0)

## 2016-09-14 LAB — CBC WITH DIFFERENTIAL/PLATELET
Basophils Absolute: 0 10*3/uL (ref 0.0–0.1)
Basophils Relative: 0 %
Eosinophils Absolute: 0.1 10*3/uL (ref 0.0–0.7)
Eosinophils Relative: 1 %
HCT: 36.5 % (ref 36.0–46.0)
Hemoglobin: 11.8 g/dL — ABNORMAL LOW (ref 12.0–15.0)
Lymphocytes Relative: 19 %
Lymphs Abs: 1.6 10*3/uL (ref 0.7–4.0)
MCH: 29.5 pg (ref 26.0–34.0)
MCHC: 32.3 g/dL (ref 30.0–36.0)
MCV: 91.3 fL (ref 78.0–100.0)
Monocytes Absolute: 1 10*3/uL (ref 0.1–1.0)
Monocytes Relative: 12 %
Neutro Abs: 5.7 10*3/uL (ref 1.7–7.7)
Neutrophils Relative %: 68 %
Platelets: 240 10*3/uL (ref 150–400)
RBC: 4 MIL/uL (ref 3.87–5.11)
RDW: 16.7 % — ABNORMAL HIGH (ref 11.5–15.5)
WBC: 8.3 10*3/uL (ref 4.0–10.5)

## 2016-09-14 LAB — COMPREHENSIVE METABOLIC PANEL
ALT: 13 U/L — ABNORMAL LOW (ref 14–54)
AST: 14 U/L — ABNORMAL LOW (ref 15–41)
Albumin: 3.7 g/dL (ref 3.5–5.0)
Alkaline Phosphatase: 82 U/L (ref 38–126)
Anion gap: 8 (ref 5–15)
BUN: 10 mg/dL (ref 6–20)
CO2: 33 mmol/L — ABNORMAL HIGH (ref 22–32)
Calcium: 8.5 mg/dL — ABNORMAL LOW (ref 8.9–10.3)
Chloride: 102 mmol/L (ref 101–111)
Creatinine, Ser: 0.74 mg/dL (ref 0.44–1.00)
GFR calc Af Amer: >60 mL/min (ref >60)
GFR calc non Af Amer: >60 mL/min (ref >60)
Glucose, Bld: 101 mg/dL — ABNORMAL HIGH (ref 65–99)
Potassium: 3.7 mmol/L (ref 3.5–5.1)
Sodium: 143 mmol/L (ref 135–145)
Total Bilirubin: 1.3 mg/dL — ABNORMAL HIGH (ref 0.3–1.2)
Total Protein: 6.7 g/dL (ref 6.5–8.1)

## 2016-09-14 LAB — PROTIME-INR
INR: 1.13
Prothrombin Time: 14.4 seconds (ref 11.4–15.2)

## 2016-09-14 LAB — I-STAT CG4 LACTIC ACID, ED
Lactic Acid, Venous: 1.28 mmol/L (ref 0.5–1.9)
Lactic Acid, Venous: 0.87 mmol/L (ref 0.5–1.9)

## 2016-09-14 LAB — TROPONIN I: Troponin I: <0.03 ng/mL (ref <0.03)

## 2016-09-14 LAB — RAPID STREP SCREEN (MED CTR MEBANE ONLY): Streptococcus, Group A Screen (Direct): NEGATIVE

## 2016-09-14 MED ORDER — DILTIAZEM HCL 25 MG/5ML IV SOLN
15.0000 mg | Freq: Once | INTRAVENOUS | Status: AC
  Administered 2016-09-14: 15 mg via INTRAVENOUS
  Filled 2016-09-14: qty 5

## 2016-09-14 MED ORDER — PREDNISONE 20 MG PO TABS
60.0000 mg | ORAL_TABLET | Freq: Once | ORAL | Status: AC
  Administered 2016-09-14: 60 mg via ORAL
  Filled 2016-09-14: qty 3

## 2016-09-14 MED ORDER — DILTIAZEM HCL 100 MG IV SOLR
5.0000 mg/h | Freq: Once | INTRAVENOUS | Status: AC
  Administered 2016-09-14: 5 mg/h via INTRAVENOUS
  Filled 2016-09-14: qty 100

## 2016-09-14 MED ORDER — METOPROLOL TARTRATE 5 MG/5ML IV SOLN: 2.5000 mg | INTRAVENOUS | Status: DC

## 2016-09-14 MED ORDER — DILTIAZEM LOAD VIA INFUSION: 15.0000 mg | Freq: Once | INTRAVENOUS | Status: DC

## 2016-09-14 MED ORDER — IPRATROPIUM BROMIDE 0.02 % IN SOLN
0.5000 mg | Freq: Once | RESPIRATORY_TRACT | Status: AC
  Administered 2016-09-14: 0.5 mg via RESPIRATORY_TRACT
  Filled 2016-09-14: qty 2.5

## 2016-09-14 MED ORDER — METOPROLOL TARTRATE 5 MG/5ML IV SOLN: 2.5000 mg | INTRAVENOUS | Status: DC | PRN

## 2016-09-14 MED ORDER — METOPROLOL TARTRATE 5 MG/5ML IV SOLN
2.5000 mg | INTRAVENOUS | Status: AC
Start: 2016-09-14 — End: 2016-09-14
  Administered 2016-09-14: 2.5 mg via INTRAVENOUS
  Filled 2016-09-14: qty 5

## 2016-09-14 MED ORDER — DEXTROSE 5 % IV SOLN
5.0000 mg/h | INTRAVENOUS | Status: DC
  Administered 2016-09-14 – 2016-09-15 (×3): 15 mg/h via INTRAVENOUS
  Filled 2016-09-14 (×3): qty 100

## 2016-09-14 MED ORDER — LEVOFLOXACIN 500 MG PO TABS: 500.0000 mg | ORAL_TABLET | Freq: Every day | ORAL | 0 refills | Status: DC

## 2016-09-14 MED ORDER — ALBUTEROL SULFATE (2.5 MG/3ML) 0.083% IN NEBU
5.0000 mg | INHALATION_SOLUTION | Freq: Once | RESPIRATORY_TRACT | Status: AC
  Administered 2016-09-14: 5 mg via RESPIRATORY_TRACT
  Filled 2016-09-14: qty 6

## 2016-09-14 MED ORDER — DILTIAZEM HCL 25 MG/5ML IV SOLN
10.0000 mg | Freq: Once | INTRAVENOUS | Status: AC
  Administered 2016-09-14: 10 mg via INTRAVENOUS

## 2016-09-14 MED ORDER — APIXABAN 5 MG PO TABS
5.0000 mg | ORAL_TABLET | Freq: Once | ORAL | Status: AC
  Administered 2016-09-14: 5 mg via ORAL
  Filled 2016-09-14: qty 1

## 2016-09-14 NOTE — Assessment & Plan Note (Signed)
Mild to mod, for antibx course,  to f/u any worsening symptoms or concerns 

## 2016-09-14 NOTE — Patient Instructions (Signed)
Please take all new medication as prescribed - the antibiotic  Please go now to [facility] ED for hour heart rhythm problem  Please continue all other medications as before, and refills have been done if requested.  Please have the pharmacy call with any other refills you may need.  Please keep your appointments with your specialists as you may have planned

## 2016-09-14 NOTE — H&P (Addendum)
History and Physical    [name] [name] PFX:[id number] DOB: 09/09/1950 DOA: 09/14/2016  PCP: Biagio Borg, MD  Patient coming from: Home   Chief Complaint: Fatigue, Cough.  HPI: [name] [name] is a 66 y.o. female with medical history significant for HTN, paroxysmal Atria Fib/Atri flutter, morbid obesity, COPD, depression, HTN, who presented to the ED from PCP's office with Atria Fibrillation with RVR, with heart rates- 130s- 140s. Patient reports generalized weakness of one day duration, cough productive of yellow sputum that started this morning. She reports some wheezing this morning and shortness of breath, mild, chronic stable 4 pillow orthopnea. No leg swelling or redness. She has been compliant with her anticoagulation- eliquis. Patient reports sore throat, with subjective fever 2 days ago, no chills. Patient denies palpitations. No alcohol use. No IV drug use. Quit smoking cigarettes 25 years ago. At home she is on 2.5 L of O2, but when using her portable O2 device she uses 4 L.   ED Course: Patient was wheezing, Heart rate 129-132, O2 sats 95% on 2 L O2 by nasal cannula, respiratory rates 21 temperature 99. Rapid strep test was negative , Blood work showed WBC normal at 8, hemoglobin stable at 11.8, platelets normal at 240, unremarkable electrolytes creatinine stable at 0.74, i-STAT lactic acid 1.28. Troponin < 0.03. BNP 98. Chest x-ray showed chronic interstitial coarsening without acute findings.   Review of Systems: As per HPI otherwise 10 point review of systems negative.   Past Medical History:  Diagnosis Date  . Anemia    hx  . Anxiety   . Arthritis   . Asthma   . Atrial fibrillation and flutter ([place])   . Atypical lobular hyperplasia of right breast 05/09/2015  . Breast CA ([place])    ?  Marland Kitchen CHF (congestive heart failure) ([place])   . Chronic rhinitis   . Complication of anesthesia   . COPD (chronic obstructive pulmonary disease) ([place])    [place]. PFTs 07/25/06 FEV1 55% ratio 53,  DLC0 51% HFA 50% 12/16/2009  . Depression   . FRACTURE, RIB, RIGHT 06/18/2009  . Glucose intolerance (impaired glucose tolerance)    on steroids  . Heart murmur   . History of DVT (deep vein thrombosis)   . Hx of cardiac catheterization    LHC (01/2001):  Normal Cors.  EF 65%.;  LexiScan Myoview (03/2013): No ischemia, EF 61%, normal  . Hx of echocardiogram    Echo (11/2011):  Mod LVH, EF 60-65%, no RWMA, MAC, mild LAE, PASP 34.  Marland Kitchen HYPERLIPIDEMIA   . Hypertension   . Hypothyroidism   . Morbid obesity ([place])    target wt=179lb for BMI<30 Peak wt 282lb  . Osteoporosis    last BMD 4/08 -2.4, intolerant of bisphosphonates  . Peripheral vascular disease (H[facility])    hx dvt.pe  . Pneumonia    hx  . PONV (postoperative nausea and vomiting)   . PREMATURE VENTRICULAR CONTRACTIONS   . PULMONARY EMBOLISM, HX OF 06/19/2007  . Rhabdomyolysis 07/29/2009  . Shortness of breath dyspnea   . Tremor   . VITAMIN D DEFICIENCY     Past Surgical History:  Procedure Laterality Date  . ABDOMINAL HYSTERECTOMY    . BREAST LUMPECTOMY WITH RADIOACTIVE SEED LOCALIZATION Right 06/05/2015   Procedure: RIGHT BREAST LUMPECTOMY WITH RADIOACTIVE SEED LOCALIZATION;  Surgeon: Fanny Skates, MD;  Location: [facility];  Service: General;  Laterality: Right;  . BREAST SURGERY Left    s/p mass removal  . CARDIOVERSION N/A 12/11/2014  Procedure: CARDIOVERSION;  Surgeon: Josue Hector, MD;  Location: [facility] ENDOSCOPY;  Service: Cardiovascular;  Laterality: N/A;  . COLONOSCOPY  10/22/2003, ?2006  . COLONOSCOPY WITH PROPOFOL N/A 01/15/2016   Procedure: COLONOSCOPY WITH PROPOFOL;  Surgeon: Gatha Mayer, MD;  Location: [facility] ENDOSCOPY;  Service: Endoscopy;  Laterality: N/A;  . ESOPHAGOGASTRODUODENOSCOPY (EGD) WITH PROPOFOL N/A 01/15/2016   Procedure: ESOPHAGOGASTRODUODENOSCOPY (EGD) WITH PROPOFOL;  Surgeon: Gatha Mayer, MD;  Location: [facility] ENDOSCOPY;  Service: Endoscopy;  Laterality: N/A;  . s/p left arm fracture with fall off chair    .  skin graft to middle R finger  1975  . TEE WITHOUT CARDIOVERSION N/A 12/11/2014   Procedure: TRANSESOPHAGEAL ECHOCARDIOGRAM (TEE);  Surgeon: Josue Hector, MD;  Location: [facility] ENDOSCOPY;  Service: Cardiovascular;  Laterality: N/A;  . TONSILLECTOMY       reports that she quit smoking about 20 years ago. Her smoking use included Cigarettes. She has a 35.00 pack-year smoking history. She has never used smokeless tobacco. She reports that she does not drink alcohol or use drugs.  Allergies  Allergen Reactions  . Duloxetine Other (See Comments)    REACTION: rhabdomyolysis  . Penicillins Other (See Comments)    SYNCOPE Has patient had a PCN reaction causing immediate rash, facial/tongue/throat swelling, SOB or lightheadedness with hypotension: Yes Has patient had a PCN reaction causing severe rash involving mucus membranes or skin necrosis: No Has patient had a PCN reaction that required hospitalization No Has patient had a PCN reaction occurring within the last 10 years: No If all of the above answers are "NO", then may proceed with Cephalosporin use.   . Latex Rash    Family History  Problem Relation Age of Onset  . Heart disease Mother   . Liver disease Mother        alcohol related  . Asthma Brother   . Esophageal cancer Brother   . Asthma Son   . Colon cancer Neg Hx   . Stomach cancer Neg Hx   . Pancreatic cancer Neg Hx   . Inflammatory bowel disease Neg Hx    Prior to Admission medications   Medication Sig Start Date End Date Taking? Authorizing Provider  acetaminophen (TYLENOL) 650 MG CR tablet Per bottle as needed   Yes [provider]  albuterol (PROAIR HFA) 108 (90 Base) MCG/ACT inhaler Inhale 2 puffs into the lungs every 6 (six) hours as needed for wheezing or shortness of breath. 06/21/16  Yes Tanda Rockers, MD  atorvastatin (LIPITOR) 10 MG tablet Take 1 tablet (10 mg total) by mouth daily. 07/05/16  Yes Biagio Borg, MD  budesonide-formoterol [facility])  160-4.5 MCG/ACT inhaler Inhale 2 puffs into the lungs 2 (two) times daily. 12/12/15  Yes Parrett, [name] S, NP  Calcium Carbonate-Vitamin D 600-400 MG-UNIT per tablet Take 1 tablet by mouth 2 (two) times daily.     Yes [provider]  chlorpheniramine (CHLOR-TRIMETON) 4 MG tablet Take 4 mg by mouth every 4 (four) hours as needed for allergies.    Yes [provider]  dextromethorphan (DELSYM) 30 MG/5ML liquid Take 60 mg by mouth 2 (two) times daily as needed. 2 tsp twice daily as needed    Yes [provider]  Dextromethorphan-Guaifenesin (MUCINEX DM MAXIMUM STRENGTH) 60-1200 MG TB12 Take 1 tablet by mouth every 12 (twelve) hours as needed (cough/congestion). With flutter   Yes [provider]  diltiazem (CARDIZEM CD) 360 MG 24 hr capsule Take 1 capsule (360 mg total) by  mouth daily. 10/16/15  Yes Lelon Perla, MD  ELIQUIS 5 MG TABS tablet TAKE 1 TABLET TWICE A DAY 07/19/16  Yes Lelon Perla, MD  ferrous sulfate 325 (65 FE) MG tablet Take 325 mg by mouth 2 (two) times daily with a meal.    Yes [provider]  FLUoxetine (PROZAC) 40 MG capsule TAKE 1 CAPSULE DAILY 06/22/16  Yes Biagio Borg, MD  glucosamine-chondroitin 500-400 MG tablet Take 1 tablet by mouth every morning.    Yes [provider]  KLOR-CON M20 20 MEQ tablet Take 1 tablet (20 mEq total) by mouth 2 (two) times daily. Please schedule appointment for refills. 08/04/16  Yes Lelon Perla, MD  levalbuterol Penne Lash) 1.25 MG/3ML nebulizer solution Take 1.25 mg by nebulization every 4 (four) hours as needed for wheezing or shortness of breath.   Yes [provider]  levothyroxine (SYNTHROID, LEVOTHROID) 25 MCG tablet TAKE 1 TABLET DAILY BEFORE BREAKFAST 01/22/16  Yes Biagio Borg, MD  Olopatadine HCl (PATADAY) 0.2 % SOLN Place 1 drop into both eyes daily.   Yes [provider]  omeprazole (PRILOSEC) 20 MG capsule TAKE 2 CAPSULES DAILY Patient taking differently:  Take 64ms before supper daily 12/15/15  Yes JBiagio Borg MD  OXYGEN Inhale 2.5 L/min into the lungs continuous.   Yes [provider]  oxymetazoline (AFRIN) 0.05 % nasal spray 1 puff twice daily x5 days as needed for stuffy nose - before nasonex   Yes [provider]  PRESCRIPTION MEDICATION Place 1 spray into the nose daily. Nasal spray- pt unaware of the drug name.,  I called the pharmacy and it was not on file as filled   Yes [provider]  propafenone (RYTHMOL SR) 325 MG 12 hr capsule Take 1 capsule (325 mg total) by mouth 2 (two) times daily. 11/11/15  Yes CLelon Perla MD  sodium chloride (OCEAN) 0.65 % SOLN nasal spray Place 2 sprays into both nostrils every 4 (four) hours as needed for congestion.    Yes [provider]  traMADol (ULTRAM) 50 MG tablet Take 1-2 tablets (50-100 mg total) by mouth every 4 (four) hours as needed. 08/05/16  Yes WTanda Rockers MD  fluticasone (FLONASE) 50 MCG/ACT nasal spray Place 2 sprays into both nostrils 2 (two) times daily as needed for allergies or rhinitis.    [provider]  furosemide (LASIX) 80 MG tablet TAKE ONE AND ONE-HALF TABLETS IN THE MORNING AND 1 TABLET IN THE AFTERNOON 09/14/16   CLelon Perla MD  levofloxacin (LEVAQUIN) 500 MG tablet Take 1 tablet (500 mg total) by mouth daily. 09/14/16 09/24/16  JBiagio Borg MD  predniSONE (DELTASONE) 10 MG tablet 4 tabs for 2 days, then 3 tabs for 2 days, 2 tabs for 2 days, then 1 tab for 2 days, then stop 08/03/16   PMelvenia Needles NP    Physical Exam: Vitals:   09/14/16 1815 09/14/16 1845 09/14/16 1900 09/14/16 1915  BP: (!) 135/100 (!) 124/94 (!) 138/93 130/89  Pulse: (!) 131 (!) 132 (!) 133 (!) 132  Resp: (!) 22 16 17  (!) 21  Temp:      TempSrc:      SpO2: 97% 95% 95% 96%    Constitutional: NAD, calm, comfortable, and O2 by nasal cannula, obese Vitals:   09/14/16 1815 09/14/16 1845 09/14/16 1900 09/14/16 1915  BP: (!) 135/100 (!) 124/94  (!) 138/93 130/89  Pulse: (!) 131 (!) 132 (!) 133 (Marland Kitchen  132  Resp: (!) 22 16 17  (!) 21  Temp:      TempSrc:      SpO2: 97% 95% 95% 96%   Eyes: PERRL, lids and conjunctivae normal ENMT: Mucous membranes are moist. Posterior pharynx, Not fully visualized due to habitus but clear of any exudate or lesions, no erythema . Neck: normal, supple, no masses, no thyromegaly Respiratory: clear to auscultation bilaterally, no wheezing, no crackles. Normal respiratory effort. No accessory muscle use.  Cardiovascular: Tachycardic irregular rate and rhythm, no murmurs / rubs / gallops. No extremity edema. 2+ pedal pulses. Abdomen: no tenderness, no masses palpated. No hepatosplenomegaly. Bowel sounds positive.  Musculoskeletal: no clubbing / cyanosis. No joint deformity upper and lower extremities. Good ROM, no contractures. Normal muscle tone.  Skin: no rashes, lesions, ulcers. No induration Neurologic: CN 2-12 grossly intact. Sensation intact, Strength 5/5 in all 4.  Psychiatric: Normal judgment and insight. Alert and oriented x 3. Normal mood.   Labs on Admission: I have personally reviewed following labs and imaging studies  CBC:  Recent Labs Lab 09/14/16 1252  WBC 8.3  NEUTROABS 5.7  HGB 11.8*  HCT 36.5  MCV 91.3  PLT 892   Basic Metabolic Panel:  Recent Labs Lab 09/14/16 1252  NA 143  K 3.7  CL 102  CO2 33*  GLUCOSE 101*  BUN 10  CREATININE 0.74  CALCIUM 8.5*   GFR: Estimated Creatinine Clearance: 86.8 mL/min (by C-G formula based on SCr of 0.74 mg/dL). Liver Function Tests:  Recent Labs Lab 09/14/16 1252  AST 14*  ALT 13*  ALKPHOS 82  BILITOT 1.3*  PROT 6.7  ALBUMIN 3.7   Coagulation Profile:  Recent Labs Lab 09/14/16 1252  INR 1.13   Cardiac Enzymes:  Recent Labs Lab 09/14/16 1542  TROPONINI <0.03   Urine analysis:    Component Value Date/Time   COLORURINE YELLOW 04/11/2015 1011   APPEARANCEUR CLEAR 04/11/2015 1011   LABSPEC <=1.005 (A) 04/11/2015  1011   PHURINE 8.0 04/11/2015 1011   GLUCOSEU NEGATIVE 04/11/2015 1011   HGBUR NEGATIVE 04/11/2015 1011   HGBUR negative 08/06/2008 1214   BILIRUBINUR NEGATIVE 04/11/2015 1011   BILIRUBINUR NEG 04/24/2012 1156   KETONESUR NEGATIVE 04/11/2015 1011   PROTEINUR NEGATIVE 06/20/2013 0816   UROBILINOGEN 2.0 (A) 04/11/2015 1011   NITRITE NEGATIVE 04/11/2015 1011   LEUKOCYTESUR MODERATE (A) 04/11/2015 1011    Radiological Exams on Admission: Dg Chest 2 View  Result Date: 09/14/2016 CLINICAL DATA:  Productive cough and worsening shortness of breath. EXAM: CHEST  2 VIEW COMPARISON:  08/03/2016 FINDINGS: The cardio pericardial silhouette is enlarged. The lungs are clear without focal pneumonia, edema, pneumothorax or pleural effusion. Interstitial markings are diffusely coarsened with chronic features. The visualized bony structures of the thorax are intact. IMPRESSION: Stable exam. Chronic interstitial coarsening without acute cardiopulmonary findings. Electronically Signed   By: Misty Stanley M.D.   On: 09/14/2016 13:20    EKG- Atria flutter, 2-1 conduction, rates-  133, QTC- 363.  Assessment/Plan Principal Problem:   Atrial fibrillation with RVR (HCC) Active Problems:   Severe obesity (BMI >= 40) (HCC)   Anxiety state   Depression   Essential hypertension   PAF (paroxysmal atrial fibrillation) (HCC)   Chronic respiratory failure with hypoxia and hypercapnia (HCC)   PULMONARY EMBOLISM, HX OF   DEEP VENOUS THROMBOPHLEBITIS, HX OF   Long term current use of anticoagulant therapy   Major depressive disorder, single episode, unspecified   Chronic diastolic CHF (congestive heart failure) (  [place])  Atrial flutter- heart rates 130s despite IV Cardizem drip. EKG shows atrial flutter. On chronic anticoagulation eliquis, reports compliance. History of TEE cardioversion - 11/2014. Home medications Cardizem and propafenone. Blood pressure systolic 814G to 818H. Last TSH -03/2016- 5.05. Trop X1 <0.03. -  admit to stepdown, inpatient - Continue Cardizem drip - Talked to cardiology recommended 2.5 mg of metoprolol X2, Q5-10 mins, continue home propafenone, no amiodarone as patient is on propafenone.  - Formal Cardiology consult, recs appreciated ECHO, NPO from midnight for possible DCCV am - Continue home anticoagulation eliquis - TSH- 0.9 - Trend troponins  COPD exacerbation- purulent cough, mild increased shortness of breath, reports of wheezing- which had resolved on my exam after bronchodilator treatment. WBC 8, chest x-ray chronic interstitial coarsening. 60 mg prednisone given in ED 1. On chronic 2.5 L O2, and 4L with portable O2 device. Quit smoking >14yr ago. - Continue bronchodilators with Xopenex and ipratropium - Continue home Symbicort - Continue Prednisone 60 daily - IV Levaquin 500 mg daily - O2 by nasal cannula - Mucolytics  History of DVT and PE- has been on anticoagulation for several years - Continue Eliquis  Depression/anxiety- continue home Prozac  Diastolic CHF- stable, patient appears euvolemic . TEE - 11/2014- cannot few details. Last echo on file- 11/2011- EF 60-65%. Patient is on Lasix 1275m a.m., 8088m.m.. -Marland KitchenContinue home Lasix dose, and K supplementation  DVT prophylaxis: Eliquis  Code Status: Full Family Communication: Patient spouse RicSamia Kuklat bedside Disposition Plan: Home Consults called: Cardiology  Admission status: Inpatient, stepdown    EjiBethena Roys [facility] Pager [phone] 7PM-7AM, please contact night-coverage [email or web address] Password [email or web address]  09/14/2016, 8:15 PM

## 2016-09-14 NOTE — Assessment & Plan Note (Signed)
No wheezing or worsening sob, stable Exam, does not appear volume increased, to f/u any worsening symptoms or concerns

## 2016-09-14 NOTE — Telephone Encounter (Signed)
REFILL 

## 2016-09-14 NOTE — Consult Note (Addendum)
Cardiology Consultation:   Patient ID: [name] [name]; [id number]; [birth year]   Admit date: 09/14/2016 Date of Consult: 09/14/2016  Primary Care Provider: Biagio Borg, MD Primary Cardiologist: Dr. Debara Pickett Primary Electrophysiologist:  None   Patient Profile:   [name] [name] is a 66 y.o. female with a hx of atrial fibrillation  who is being seen today for the evaluation of atrial fibrillation with RVR at the request of Dr. Denton Brick.  History of Present Illness:   [name] [name] is a 66 y.o. female with a PMH of  HTN, paroxysmal Atrial Fib/Atrial flutter, morbid obesity and COPD who presented to the ED from PCP's office with reportedly Afib with RVR with heart rates 130-140bpm.  Patient reports generalized weakness of one day duration and cough productive of yellow sputum that started this morning. She also had some wheezing this morning with shortness of breath.  She has chronic stable 4 pillow orthopnea. She denies any recent LE edema and has been compliant with her anticoagulation- eliquis not missing any doses in the pat 4 weeks. She had a sore throat with subjective fever 2 days ago.  She denies any ETOH use or IV drug use. At home she is on 2.5 L of O2, but when using her portable O2 device she uses 4 L.  In the ER she was wheezing, O2 sats 95% on 2 L O2 by nasal cannula, respiratory rates 21 temperature 99. Rapid strep test was negative , Blood work showed WBC normal at 8, hemoglobin stable at 11.8, platelets normal at 240, unremarkable electrolytes creatinine stable at 0.74, i-STAT lactic acid 1.28. Troponin < 0.03. BNP 98. Chest x-ray showed chronic interstitial coarsening without acute findings.   TRH admitted the patient and placed her on IV Cardizem gtt and titrated as high as 59m /hr without any significant improvement in her HR.  She was given a total of 563mLopressor IV as well with no improvement.  Cardiology is now asked to help with management of her atrial flutter with RVR.    Past  Medical History:  Diagnosis Date  . Anemia    hx  . Anxiety   . Arthritis   . Asthma   . Atrial fibrillation and flutter ([facility]  . Atypical lobular hyperplasia of right breast 05/09/2015  . Breast CA ([facility]   ?  . Marland KitchenHF (congestive heart failure) ([facility]  . Chronic rhinitis   . Complication of anesthesia   . COPD (chronic obstructive pulmonary disease) ([facility]   [place]. PFTs 07/25/06 FEV1 55% ratio 53, DLC0 51% HFA 50% 12/16/2009  . Depression   . FRACTURE, RIB, RIGHT 06/18/2009  . Glucose intolerance (impaired glucose tolerance)    on steroids  . Heart murmur   . History of DVT (deep vein thrombosis)   . Hx of cardiac catheterization    LHC (01/2001):  Normal Cors.  EF 65%.;  LexiScan Myoview (03/2013): No ischemia, EF 61%, normal  . Hx of echocardiogram    Echo (11/2011):  Mod LVH, EF 60-65%, no RWMA, MAC, mild LAE, PASP 34.  . Marland KitchenYPERLIPIDEMIA   . Hypertension   . Hypothyroidism   . Morbid obesity (HCI[facility]   target wt=179lb for BMI<30 Peak wt 282lb  . Osteoporosis    last BMD 4/08 -2.4, intolerant of bisphosphonates  . Peripheral vascular disease (HCC)    hx dvt.pe  . Pneumonia    hx  . PONV (postoperative nausea and vomiting)   . PREMATURE VENTRICULAR CONTRACTIONS   .  PULMONARY EMBOLISM, HX OF 06/19/2007  . Rhabdomyolysis 07/29/2009  . Shortness of breath dyspnea   . Tremor   . VITAMIN D DEFICIENCY     Past Surgical History:  Procedure Laterality Date  . ABDOMINAL HYSTERECTOMY    . BREAST LUMPECTOMY WITH RADIOACTIVE SEED LOCALIZATION Right 06/05/2015   Procedure: RIGHT BREAST LUMPECTOMY WITH RADIOACTIVE SEED LOCALIZATION;  Surgeon: Fanny Skates, MD;  Location: [facility];  Service: General;  Laterality: Right;  . BREAST SURGERY Left    s/p mass removal  . CARDIOVERSION N/A 12/11/2014   Procedure: CARDIOVERSION;  Surgeon: Josue Hector, MD;  Location: [place];  Service: Cardiovascular;  Laterality: N/A;  . COLONOSCOPY  10/22/2003, ?2006  . COLONOSCOPY WITH PROPOFOL N/A  01/15/2016   Procedure: COLONOSCOPY WITH PROPOFOL;  Surgeon: Gatha Mayer, MD;  Location: [facility];  Service: Endoscopy;  Laterality: N/A;  . ESOPHAGOGASTRODUODENOSCOPY (EGD) WITH PROPOFOL N/A 01/15/2016   Procedure: ESOPHAGOGASTRODUODENOSCOPY (EGD) WITH PROPOFOL;  Surgeon: Gatha Mayer, MD;  Location: [facility];  Service: Endoscopy;  Laterality: N/A;  . s/p left arm fracture with fall off chair    . skin graft to middle R finger  1975  . TEE WITHOUT CARDIOVERSION N/A 12/11/2014   Procedure: TRANSESOPHAGEAL ECHOCARDIOGRAM (TEE);  Surgeon: Josue Hector, MD;  Location[facility] END[facility]COPY;  Service: Cardiovascular;  Laterality: N/A;  . TONSILLECTOMY       Home Medications:  Prior to Admission medications   Medication Sig Start Date End Date Taking? Authorizing Provider  acetaminophen (TYLENOL) 650 MG CR tablet Per bottle as needed   Yes [provider]  albuterol (PROAIR HFA) 108 (90 Base) MCG/ACT inhaler Inhale 2 puffs into the lungs every 6 (six) hours as needed for wheezing or shortness of breath. 06/21/16  Yes Tanda Rockers, MD  atorvastatin (LIPITOR) 10 MG tablet Take 1 tablet (10 mg total) by mouth daily. 07/05/16  Yes Biagio Borg, MD  budesonide-formoterol [facility]) 160-4.5 MCG/ACT inhaler Inhale 2 puffs into the lungs 2 (two) times daily. 12/12/15  Yes Parrett, [name] S, NP  Calcium Carbonate-Vitamin D 600-400 MG-UNIT per tablet Take 1 tablet by mouth 2 (two) times daily.     Yes [provider]  chlorpheniramine (CHLOR-TRIMETON) 4 MG tablet Take 4 mg by mouth every 4 (four) hours as needed for allergies.    Yes [provider]  dextromethorphan (DELSYM) 30 MG/5ML liquid Take 60 mg by mouth 2 (two) times daily as needed. 2 tsp twice daily as needed    Yes [provider]  Dextromethorphan-Guaifenesin (MUCINEX DM MAXIMUM STRENGTH) 60-1200 MG TB12 Take 1 tablet by mouth every 12 (twelve) hours as needed (cough/congestion). With flutter   Yes [provider]  diltiazem (CARDIZEM CD) 360 MG 24 hr capsule Take 1 capsule (360 mg total) by mouth daily. 10/16/15  Yes Lelon Perla, MD  ELIQUIS 5 MG TABS tablet TAKE 1 TABLET TWICE A DAY 07/19/16  Yes Lelon Perla, MD  ferrous sulfate 325 (65 FE) MG tablet Take 325 mg by mouth 2 (two) times daily with a meal.    Yes [provider]  FLUoxetine (PROZAC) 40 MG capsule TAKE 1 CAPSULE DAILY 06/22/16  Yes Biagio Borg, MD  glucosamine-chondroitin 500-400 MG tablet Take 1 tablet by mouth every morning.    Yes [provider]  KLOR-CON M20 20 MEQ tablet Take 1 tablet (20 mEq total) by mouth 2 (two) times daily. Please schedule appointment for refills. 08/04/16  Yes Kirk Ruths  S, MD  levalbuterol (XOPENEX) 1.25 MG/3ML nebulizer solution Take 1.25 mg by nebulization every 4 (four) hours as needed for wheezing or shortness of breath.   Yes [provider]  levothyroxine (SYNTHROID, LEVOTHROID) 25 MCG tablet TAKE 1 TABLET DAILY BEFORE BREAKFAST 01/22/16  Yes Biagio Borg, MD  Olopatadine HCl (PATADAY) 0.2 % SOLN Place 1 drop into both eyes daily.   Yes [provider]  omeprazole (PRILOSEC) 20 MG capsule TAKE 2 CAPSULES DAILY Patient taking differently: Take 84ms before supper daily 12/15/15  Yes JBiagio Borg MD  OXYGEN Inhale 2.5 L/min into the lungs continuous.   Yes [provider]  oxymetazoline (AFRIN) 0.05 % nasal spray 1 puff twice daily x5 days as needed for stuffy nose - before nasonex   Yes [provider]  PRESCRIPTION MEDICATION Place 1 spray into the nose daily. Nasal spray- pt unaware of the drug name.,  I called the pharmacy and it was not on file as filled   Yes [provider]  propafenone (RYTHMOL SR) 325 MG 12 hr capsule Take 1 capsule (325 mg total) by mouth 2 (two) times daily. 11/11/15  Yes CLelon Perla MD  sodium chloride (OCEAN) 0.65 % SOLN nasal spray Place 2 sprays into both nostrils every 4 (four)  hours as needed for congestion.    Yes [provider]  traMADol (ULTRAM) 50 MG tablet Take 1-2 tablets (50-100 mg total) by mouth every 4 (four) hours as needed. 08/05/16  Yes WTanda Rockers MD  fluticasone (FLONASE) 50 MCG/ACT nasal spray Place 2 sprays into both nostrils 2 (two) times daily as needed for allergies or rhinitis.    [provider]  furosemide (LASIX) 80 MG tablet TAKE ONE AND ONE-HALF TABLETS IN THE MORNING AND 1 TABLET IN THE AFTERNOON 09/14/16   CLelon Perla MD  levofloxacin (LEVAQUIN) 500 MG tablet Take 1 tablet (500 mg total) by mouth daily. 09/14/16 09/24/16  JBiagio Borg MD  predniSONE (DELTASONE) 10 MG tablet 4 tabs for 2 days, then 3 tabs for 2 days, 2 tabs for 2 days, then 1 tab for 2 days, then stop 08/03/16   Parrett, TFonnie Mu NP    Inpatient Medications: Scheduled Meds:  Continuous Infusions: . diltiazem (CARDIZEM) infusion     PRN Meds:   Allergies:    Allergies  Allergen Reactions  . Duloxetine Other (See Comments)    REACTION: rhabdomyolysis  . Penicillins Other (See Comments)    SYNCOPE Has patient had a PCN reaction causing immediate rash, facial/tongue/throat swelling, SOB or lightheadedness with hypotension: Yes Has patient had a PCN reaction causing severe rash involving mucus membranes or skin necrosis: No Has patient had a PCN reaction that required hospitalization No Has patient had a PCN reaction occurring within the last 10 years: No If all of the above answers are "NO", then may proceed with Cephalosporin use.   . Latex Rash    Social History:   Social History   Social History  . Marital status: Married    Spouse name: N/A  . Number of children: 3  . Years of education: N/A   Occupational History  . 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 Smoking status: Former Smoker    Packs/day: 1.00    Years: 35.00    Types: Cigarettes    Quit date: 01/12/1996  . Smokeless  tobacco: Never Used  . Alcohol use No  . Drug use:  No  . Sexual activity: Not on file   Other Topics Concern  . Not on file   Social History Narrative   Married 1 son 2 daughters   Disabled   2 caffeine/day   Past smoker   11/12/2015       Family History:    Family History  Problem Relation Age of Onset  . Heart disease Mother   . Liver disease Mother        alcohol related  . Asthma Brother   . Esophageal cancer Brother   . Asthma Son   . Colon cancer Neg Hx   . Stomach cancer Neg Hx   . Pancreatic cancer Neg Hx   . Inflammatory bowel disease Neg Hx      ROS:  Please see the history of present illness.  ROS  All other ROS reviewed and negative.     Physical Exam/Data:   Vitals:   09/14/16 2015 09/14/16 2145 09/14/16 2200 09/14/16 2230  BP: 117/75 (!) 96/59 (!) 104/58 116/87  Pulse: (!) 132 (!) 127 (!) 126 (!) 132  Resp: (!) 21     Temp:      TempSrc:      SpO2: 96% 96% 95% 96%   No intake or output data in the 24 hours ending 09/14/16 2253 There were no vitals filed for this visit. There is no height or weight on file to calculate BMI.  General:  Well nourished, well developed, in no acute distress HEENT: normal Lymph: no adenopathy Neck: no JVD Endocrine:  No thryomegaly Vascular: No carotid bruits; FA pulses 2+ bilaterally without bruits  Cardiac:  normal S1, S2; RRR but tachy; no murmur  Lungs:  Diffuse wheezing Abd: soft, nontender, no hepatomegaly  Ext: no edema Musculoskeletal:  No deformities, BUE and BLE strength normal and equal Skin: warm and dry  Neuro:  CNs 2-12 intact, no focal abnormalities noted Psych:  Normal affect   EKG:  The EKG was personally reviewed and demonstrates:  Atrial flutter with 2:1 block and HR 133bpm Telemetry:  Telemetry was personally reviewed and demonstrates:  Atrial flutter with RVR  Relevant CV Studies: none  Laboratory Data:  Chemistry Recent Labs Lab 09/14/16 1252  NA 143  K 3.7  CL 102  CO2 33*    GLUCOSE 101*  BUN 10  CREATININE 0.74  CALCIUM 8.5*  GFRNONAA >60  GFRAA >60  ANIONGAP 8     Recent Labs Lab 09/14/16 1252  PROT 6.7  ALBUMIN 3.7  AST 14*  ALT 13*  ALKPHOS 82  BILITOT 1.3*   Hematology Recent Labs Lab 09/14/16 1252  WBC 8.3  RBC 4.00  HGB 11.8*  HCT 36.5  MCV 91.3  MCH 29.5  MCHC 32.3  RDW 16.7*  PLT 240   Cardiac Enzymes Recent Labs Lab 09/14/16 1542  TROPONINI <0.03   No results for input(s): TROPIPOC in the last 168 hours.  BNP Recent Labs Lab 09/14/16 1542  BNP 98.4    DDimer No results for input(s): DDIMER in the last 168 hours.  Radiology/Studies:  Dg Chest 2 View  Result Date: 09/14/2016 CLINICAL DATA:  Productive cough and worsening shortness of breath. EXAM: CHEST  2 VIEW COMPARISON:  08/03/2016 FINDINGS: The cardio pericardial silhouette is enlarged. The lungs are clear without focal pneumonia, edema, pneumothorax or pleural effusion. Interstitial markings are diffusely coarsened with chronic features. The visualized bony structures of the thorax are intact. IMPRESSION: Stable exam. Chronic interstitial coarsening without acute cardiopulmonary findings.  Electronically Signed   By: Misty Stanley M.D.   On: 09/14/2016 13:20    Assessment and Plan:   1.  Atrial flutter with RVR - no afib noted just atrial flutter which can be more difficult to treat.  This likely was triggered by her recent URI.  She is on propafenone at home 381m BID and Apixaban 564mBID and has not missed any doses .   - continue IV Cardizem gtt - will give another bolus of 105m- continue propafenone for now - likely breakthrough secondary to URI and not considered a drug failure - would avoid Amio IV due to taking her propafenone today. - avoid BB due to acute COPD exacerbation - make NPO after MN for possible DCCV in am. She has not missed any doses of apixaban in the past 4 weeks.   - continue Eliquis for CHADS2VASC score of 5 - check TSH - repeat 2D  echo as it has been a while to reassess LVF and make sure she does not have a tachy induced DCM as she cannot feel palpitations with her aflutter.  2.   HTN - BP stable on IV Cardizem gtt  3.   Acute URI with COPD exacerbation- per TRH  4.  Chronic diastolic CHF - she does not appear to have any volume overload on exam.  BNP is normal although she is obese and BNP can be falsely low in obesity.  Cxray wit no edema.  - continue current dose of home Lasix   Signed, TraFransico HimD  09/14/2016 10:53 PM

## 2016-09-14 NOTE — ED Triage Notes (Signed)
Pt to ER sent by PCP for afib RVR and further eval of productive cough and worsening shortness of breath. Wears 4L O2 nasal cannula daily for COPD. States yesterday began feeling fatigued with no appetite, states remained in bed majority of the day. Today began having productive cough with yellow mucus, as well as sore throat. Lungs diminished bilaterally, rate of a fib at 130-140.

## 2016-09-14 NOTE — ED Provider Notes (Signed)
[facility] DEPT Provider Note   CSN: [id number] Arrival date & time: 09/14/16  1241     History   Chief Complaint Chief Complaint  Patient presents with  . Atrial Fibrillation  . Shortness of Breath  . Cough    HPI  [name] [name] is a 65 y.o. Female with a history of A. fib, CHF, COPD who presents from her PCP complaining of palpitations and shortness of breath. Patient reports she went to her PCP today because she's been feeling fatigued, and feverish and has had a productive cough with yellow mucus as well as a sore throat which began over the weekend. Patient reports no aggravating or alleviating factors, did not take any medications at home to treat symptoms. PCP did EKG and patient was found to be in A. fib with RVR at 130-140 and so she was sent to the emergency department. Patient is anticoagulated with Eliquis, she also takes Cardizem and Rythmol for her A. fib, she reports she took all of her medications this morning. Patient uses 2.5 L O2 nasal cannula daily at home she is not requiring additional oxygen today. She reports increased sputum production, she reports her chest feels tight but she does not feel like she is wheezing. PCP had plan to start her on Levaquin, prednisone and albuterol nebulizers for COPD exacerbation. She reports her weight has been fluctuating slightly, has not missed any doses of her Lasix and denies lower extremity swelling. Patient denies chest pain, she reports recent runny nose and congestion likely related to allergies, she denies abdominal pain nausea or vomiting.        Past Medical History:  Diagnosis Date  . Anemia    hx  . Anxiety   . Arthritis   . Asthma   . Atrial fibrillation and flutter ([place])   . Atypical lobular hyperplasia of right breast 05/09/2015  . Breast CA ([place])    ?  Marland Kitchen CHF (congestive heart failure) ([place])   . Chronic rhinitis   . Complication of anesthesia   . COPD (chronic obstructive pulmonary disease) ([place])    [place]. PFTs 07/25/06 FEV1 55% ratio 53, DLC0 51% HFA 50% 12/16/2009  . Depression   . FRACTURE, RIB, RIGHT 06/18/2009  . Glucose intolerance (impaired glucose tolerance)    on steroids  . Heart murmur   . History of DVT (deep vein thrombosis)   . Hx of cardiac catheterization    LHC (01/2001):  Normal Cors.  EF 65%.;  LexiScan Myoview (03/2013): No ischemia, EF 61%, normal  . Hx of echocardiogram    Echo (11/2011):  Mod LVH, EF 60-65%, no RWMA, MAC, mild LAE, PASP 34.  Marland Kitchen HYPERLIPIDEMIA   . Hypertension   . Hypothyroidism   . Morbid obesity ([place])    target wt=179lb for BMI<30 Peak wt 282lb  . Osteoporosis    last BMD 4/08 -2.4, intolerant of bisphosphonates  . Peripheral vascular disease (H[facility])    hx dvt.pe  . Pneumonia    hx  . PONV (postoperative nausea and vomiting)   . PREMATURE VENTRICULAR CONTRACTIONS   . PULMONARY EMBOLISM, HX OF 06/19/2007  . Rhabdomyolysis 07/29/2009  . Shortness of breath dyspnea   . Tremor   . VITAMIN D DEFICIENCY     Patient Active Problem List   Diagnosis Date Noted  . Acute upper respiratory infection 09/14/2016  . Allergic conjunctivitis 07/28/2016  . Influenza-like illness 02/11/2016  . Absolute anemia   . Upper airway cough syndrome 12/25/2015  . Recurrent  sinus infections 08/30/2015  . Encounter for pre-operative cardiovascular clearance 05/20/2015  . Morbid obesity ([place]) 05/20/2015  . Atypical lobular hyperplasia of right breast 05/09/2015  . Fever, unspecified 04/11/2015  . Chronic diastolic CHF (congestive heart failure) ([place]) 12/04/2014  . Acute on chronic diastolic CHF (congestive heart failure), NYHA class 1 ([place]) 11/11/2014  . Acute sinus infection 09/03/2014  . Rib pain on right side 04/11/2014  . Paresthesias in left hand 03/04/2014  . Bilateral knee pain 02/13/2014  . Nausea alone 07/10/2013  . Encounter for therapeutic drug monitoring 02/09/2013  . COPD exacerbation ([place]) 01/17/2013  . Chest pain 04/19/2012  . Suicide gesture  ([place]) 03/30/2012  . Major depressive disorder, single episode, unspecified 03/30/2012  . Tremor due to multiple drugs 11/11/2011  . Diplopia 11/11/2011  . Allergic rhinitis 09/14/2011  . Right leg weakness 04/14/2011  . Long term current use of anticoagulant therapy 04/22/2010  . Impaired glucose tolerance 04/15/2010  . Preventative health care 04/15/2010  . Chronic rhinitis 04/15/2010  . PREMATURE VENTRICULAR CONTRACTIONS 10/23/2008  . Atrial flutter ([place]) 07/01/2008  . Cough 06/20/2008  . VITAMIN D DEFICIENCY 10/27/2007  . HYPERLIPIDEMIA 06/19/2007  . ANXIETY 06/19/2007  . PAF (paroxysmal atrial fibrillation) ([place]) 06/19/2007  . PULMONARY EMBOLISM, HX OF 06/19/2007  . Edema 03/20/2007  . Chronic respiratory failure with hypoxia and hypercapnia ([place]) 01/13/2007  . Depression 08/06/2006  . COPD GOLD II 08/06/2006  . Severe obesity (BMI >= 40) ([place]) 07/29/2006  . Essential hypertension 07/29/2006  . OSTEOPOROSIS 07/29/2006  . DEEP VENOUS THROMBOPHLEBITIS, HX OF 07/29/2006    Past Surgical History:  Procedure Laterality Date  . ABDOMINAL HYSTERECTOMY    . BREAST LUMPECTOMY WITH RADIOACTIVE SEED LOCALIZATION Right 06/05/2015   Procedure: RIGHT BREAST LUMPECTOMY WITH RADIOACTIVE SEED LOCALIZATION;  Surgeon: Fanny Skates, MD;  Location: [facility];  Service: General;  Laterality: Right;  . BREAST SURGERY Left    s/p mass removal  . CARDIOVERSION N/A 12/11/2014   Procedure: CARDIOVERSION;  Surgeon: Josue Hector, MD;  Location: [facility];  Service: Cardiovascular;  Laterality: N/A;  . COLONOSCOPY  10/22/2003, ?2006  . COLONOSCOPY WITH PROPOFOL N/A 01/15/2016   Procedure: COLONOSCOPY WITH PROPOFOL;  Surgeon: Gatha Mayer, MD;  Location: [facility] ENDOSCOPY;  Service: Endoscopy;  Laterality: N/A;  . ESOPHAGOGASTRODUODENOSCOPY (EGD) WITH PROPOFOL N/A 01/15/2016   Procedure: ESOPHAGOGASTRODUODENOSCOPY (EGD) WITH PROPOFOL;  Surgeon: Gatha Mayer, MD;  Location: [facility] ENDOSCOPY;  Service:  Endoscopy;  Laterality: N/A;  . s/p left arm fracture with fall off chair    . skin graft to middle R finger  1975  . TEE WITHOUT CARDIOVERSION N/A 12/11/2014   Procedure: TRANSESOPHAGEAL ECHOCARDIOGRAM (TEE);  Surgeon: Josue Hector, MD;  Location: [facility] ENDOSCOPY;  Service: Cardiovascular;  Laterality: N/A;  . TONSILLECTOMY      OB History    No data available       Home Medications    Prior to Admission medications   Medication Sig Start Date End Date Taking? Authorizing Provider  acetaminophen (TYLENOL) 650 MG CR tablet Per bottle as needed    [provider]  albuterol (PROAIR HFA) 108 (90 Base) MCG/ACT inhaler Inhale 2 puffs into the lungs every 6 (six) hours as needed for wheezing or shortness of breath. 06/21/16   Tanda Rockers, MD  atorvastatin (LIPITOR) 10 MG tablet Take 1 tablet (10 mg total) by mouth daily. 07/05/16   Biagio Borg, MD  budesonide-formoterol [facility]) 160-4.5 MCG/ACT inhaler Inhale 2 puffs into the  lungs 2 (two) times daily. 12/12/15   Parrett, Fonnie Mu, NP  Calcium Carbonate-Vitamin D 600-400 MG-UNIT per tablet Take 1 tablet by mouth 2 (two) times daily.      [provider]  chlorpheniramine (CHLOR-TRIMETON) 4 MG tablet Take 4 mg by mouth every 4 (four) hours as needed for allergies.     [provider]  dextromethorphan (DELSYM) 30 MG/5ML liquid 2 tsp twice daily as needed    [provider]  Dextromethorphan-Guaifenesin (MUCINEX DM MAXIMUM STRENGTH) 60-1200 MG TB12 Take 1 tablet by mouth every 12 (twelve) hours as needed (cough/congestion). With flutter    [provider]  diltiazem (CARDIZEM CD) 360 MG 24 hr capsule Take 1 capsule (360 mg total) by mouth daily. 10/16/15   Lelon Perla, MD  ELIQUIS 5 MG TABS tablet TAKE 1 TABLET TWICE A DAY 07/19/16   Lelon Perla, MD  ferrous sulfate 325 (65 FE) MG tablet Take 325 mg by mouth 2 (two) times daily with a meal.     [provider]  FLUoxetine  (PROZAC) 40 MG capsule TAKE 1 CAPSULE DAILY 06/22/16   Biagio Borg, MD  fluticasone [facility]) 50 MCG/ACT nasal spray Place 2 sprays into both nostrils 2 (two) times daily as needed for allergies or rhinitis.    [provider]  furosemide (LASIX) 80 MG tablet Take 80 mg by mouth 2 (two) times daily. May take 1 extra daily as needed for leg swelling along with Klor Con    [provider]  glucosamine-chondroitin 500-400 MG tablet Take 1 tablet by mouth every morning.     [provider]  KLOR-CON M20 20 MEQ tablet Take 1 tablet (20 mEq total) by mouth 2 (two) times daily. Please schedule appointment for refills. 08/04/16   Lelon Perla, MD  levalbuterol Penne Lash) 1.25 MG/3ML nebulizer solution Take 1.25 mg by nebulization every 4 (four) hours as needed for wheezing or shortness of breath.    [provider]  levofloxacin (LEVAQUIN) 500 MG tablet Take 1 tablet (500 mg total) by mouth daily. 09/14/16 09/24/16  Biagio Borg, MD  levothyroxine (SYNTHROID, LEVOTHROID) 25 MCG tablet TAKE 1 TABLET DAILY BEFORE BREAKFAST 01/22/16   Biagio Borg, MD  omeprazole (PRILOSEC) 20 MG capsule TAKE 2 CAPSULES DAILY Patient taking differently: Take 7ms before supper daily 12/15/15   JBiagio Borg MD  OXYGEN Inhale 2.5 L/min into the lungs continuous.    [provider]  oxymetazoline (AFRIN) 0.05 % nasal spray 1 puff twice daily x5 days as needed for stuffy nose - before nasonex    [provider]  predniSONE (DELTASONE) 10 MG tablet 4 tabs for 2 days, then 3 tabs for 2 days, 2 tabs for 2 days, then 1 tab for 2 days, then stop 08/03/16   Parrett, [name] S, NP  propafenone (RYTHMOL SR) 325 MG 12 hr capsule Take 1 capsule (325 mg total) by mouth 2 (two) times daily. 11/11/15   CLelon Perla MD  sodium chloride (OCEAN) 0.65 % SOLN nasal spray Place 2 sprays into both nostrils every 4 (four) hours as needed for congestion.     [provider]  traMADol  (ULTRAM) 50 MG tablet Take 1-2 tablets (50-100 mg total) by mouth every 4 (four) hours as needed. 08/05/16   WTanda Rockers MD    Family History Family History  Problem Relation Age of Onset  . Heart disease Mother   . Liver disease Mother  alcohol related  . Asthma Brother   . Esophageal cancer Brother   . Asthma Son   . Colon cancer Neg Hx   . Stomach cancer Neg Hx   . Pancreatic cancer Neg Hx   . Inflammatory bowel disease Neg Hx     Social History Social History  Substance Use Topics  . Smoking status: Former Smoker    Packs/day: 1.00    Years: 35.00    Types: Cigarettes    Quit date: 01/12/1996  . Smokeless tobacco: Never Used  . Alcohol use No     Allergies   Duloxetine; Penicillins; and Latex   Review of Systems Review of Systems  Constitutional: Positive for chills and fever.       Patient reports low-grade fever at home  HENT: Positive for congestion, rhinorrhea and sore throat. Negative for ear pain.   Eyes: Negative for photophobia and visual disturbance.  Respiratory: Positive for cough, chest tightness and shortness of breath. Negative for wheezing.   Cardiovascular: Positive for palpitations. Negative for chest pain and leg swelling.  Gastrointestinal: Negative for abdominal pain, nausea and vomiting.  Genitourinary: Negative for difficulty urinating and dysuria.  Musculoskeletal: Negative for myalgias.  Skin: Negative for pallor and rash.  Neurological: Negative for dizziness, syncope, weakness, light-headedness, numbness and headaches.     Physical Exam Updated Vital Signs BP 114/79 (BP Location: Right Arm)   Pulse (!) 135   Temp 99 F (37.2 C) (Oral)   Resp (!) 25   SpO2 94%   Physical Exam  Constitutional: She appears well-developed and well-nourished. No distress.  HENT:  Head: Normocephalic and atraumatic.  Eyes: Pupils are equal, round, and reactive to light. EOM are normal. Right eye exhibits no discharge. Left eye exhibits no  discharge.  Cardiovascular: Normal heart sounds and intact distal pulses.   Tachycardic with irregular rhythm  Pulmonary/Chest: Effort normal. No respiratory distress.  Decreased breath sounds bilaterally, no wheezes, faint crackles in lung bases  Abdominal: Soft. Bowel sounds are normal. There is no tenderness. There is no guarding.  Musculoskeletal: She exhibits no edema or deformity.  Neurological: She is alert. Coordination normal.  Speech is clear, able to follow commands CN III-XII intact Normal strength in upper and lower extremities bilaterally including dorsiflexion and plantar flexion, strong and equal grip strength Sensation normal to light and sharp touch Moves extremities without ataxia, coordination intact   Skin: Skin is warm and dry. Capillary refill takes less than 2 seconds. She is not diaphoretic.  Psychiatric: She has a normal mood and affect. Her behavior is normal.  Nursing note and vitals reviewed.    ED Treatments / Results  Labs (all labs ordered are listed, but only abnormal results are displayed) Labs Reviewed  COMPREHENSIVE METABOLIC PANEL - Abnormal; Notable for the following:       Result Value   CO2 33 (*)    Glucose, Bld 101 (*)    Calcium 8.5 (*)    AST 14 (*)    ALT 13 (*)    Total Bilirubin 1.3 (*)    All other components within normal limits  CBC WITH DIFFERENTIAL/PLATELET - Abnormal; Notable for the following:    Hemoglobin 11.8 (*)    RDW 16.7 (*)    All other components within normal limits  RAPID STREP SCREEN (NOT AT [facility])  CULTURE, BLOOD (ROUTINE X 2)  CULTURE, BLOOD (ROUTINE X 2)  CULTURE, GROUP A STREP ([place])  PROTIME-INR  URINALYSIS, ROUTINE W REFLEX MICROSCOPIC  I-STAT  CG4 LACTIC ACID, ED    EKG  EKG Interpretation None       Radiology Dg Chest 2 View  Result Date: 09/14/2016 CLINICAL DATA:  Productive cough and worsening shortness of breath. EXAM: CHEST  2 VIEW COMPARISON:  08/03/2016 FINDINGS: The cardio pericardial  silhouette is enlarged. The lungs are clear without focal pneumonia, edema, pneumothorax or pleural effusion. Interstitial markings are diffusely coarsened with chronic features. The visualized bony structures of the thorax are intact. IMPRESSION: Stable exam. Chronic interstitial coarsening without acute cardiopulmonary findings. Electronically Signed   By: Misty Stanley M.D.   On: 09/14/2016 13:20    Procedures Procedures (including critical care time)  Medications Ordered in ED Medications  albuterol (PROVENTIL) (2.5 MG/3ML) 0.083% nebulizer solution 5 mg (5 mg Nebulization Given 09/14/16 1442)  ipratropium (ATROVENT) nebulizer solution 0.5 mg (0.5 mg Nebulization Given 09/14/16 1442)  predniSONE (DELTASONE) tablet 60 mg (60 mg Oral Given 09/14/16 1443)  diltiazem (CARDIZEM) injection 15 mg (15 mg Intravenous Given 09/14/16 1552)  diltiazem (CARDIZEM) 100 mg in dextrose 5 % 100 mL (1 mg/mL) infusion (5 mg/hr Intravenous New Bag/Given 09/14/16 1645)     Initial Impression / Assessment and Plan / ED Course  I have reviewed the triage vital signs and the nursing notes.  Pertinent labs & imaging results that were available during my care of the patient were reviewed by me and considered in my medical decision making (see chart for details).  Patient presents with cough and shortness of breath, currently in A. fib with RVR. On initial eval patient is tachycardic to the 130s, tachypneic, afebrile. Patient is not moving much air on lung exam, no wheezes. Patient likely has some combination of CHF and COPD exacerbation, likely in the setting of viral illness. CBC and CMP are unremarkable, rapid strep negative, chest x-ray shows no evidence of infection or pulmonary edema. Lactate negative, not concerned for sepsis. Will add on troponin and BNP. Diltiazem bolus for A. fib with RVR, nebulizers and prednisone for COPD exacerbation.  Patient still tachycardic to the 120s after diltiazem bolus, will start drip.  Troponin negative and BNP unremarkable. Patient will likely need to be admitted for continued management of A. fib with RVR and COPD exacerbation.   At shift change care was transferred to Dr. Theotis Burrow who will follow pending studies, re-evaulate and determine disposition.     Final Clinical Impressions(s) / ED Diagnoses   Final diagnoses:  Atrial fibrillation with RVR ([place])  COPD exacerbation [facility])    New Prescriptions New Prescriptions   No medications on file     Janet Berlin 09/15/16 0715    Little, Wenda Over[place], MD 09/21/16 [phone]

## 2016-09-14 NOTE — Assessment & Plan Note (Signed)
ecg reveiwed with pt - HR to 130 with hemodynamic stable, and good med compliance including this AM; I urged her to go to ED for further management, maybe repeat Cardioversion?  Husband agrees to take her

## 2016-09-14 NOTE — Progress Notes (Addendum)
Subjective:    Patient ID: [name] [name], female    DOB: 03-Oct-1950, 66 y.o.   MRN: [id number]  HPI   Here with 3 days acute onset fever, facial pain, pressure, headache, general weakness and malaise, and greenish d/c, with mild ST and cough, but pt denies chest pain, wheezing, increased sob or doe, orthopnea, PND, increased LE swelling, palpitations, dizziness or syncope, but just feels bad today, fatigued and HR 120 at intake  Denies worsening cough, wheezing, sob   Has hx of aflutter requiring CV in ED Past Medical History:  Diagnosis Date  . Anemia    hx  . Anxiety   . Arthritis   . Asthma   . Atrial fibrillation and flutter ([place])   . Atypical lobular hyperplasia of right breast 05/09/2015  . Breast CA ([place])    ?  Marland Kitchen CHF (congestive heart failure) ([place])   . Chronic rhinitis   . Complication of anesthesia   . COPD (chronic obstructive pulmonary disease) ([place])    [place]. PFTs 07/25/06 FEV1 55% ratio 53, DLC0 51% HFA 50% 12/16/2009  . Depression   . FRACTURE, RIB, RIGHT 06/18/2009  . Glucose intolerance (impaired glucose tolerance)    on steroids  . Heart murmur   . History of DVT (deep vein thrombosis)   . Hx of cardiac catheterization    LHC (01/2001):  Normal Cors.  EF 65%.;  LexiScan Myoview (03/2013): No ischemia, EF 61%, normal  . Hx of echocardiogram    Echo (11/2011):  Mod LVH, EF 60-65%, no RWMA, MAC, mild LAE, PASP 34.  Marland Kitchen HYPERLIPIDEMIA   . Hypertension   . Hypothyroidism   . Morbid obesity ([place])    target wt=179lb for BMI<30 Peak wt 282lb  . Osteoporosis    last BMD 4/08 -2.4, intolerant of bisphosphonates  . Peripheral vascular disease (HCC)    hx dvt.pe  . Pneumonia    hx  . PONV (postoperative nausea and vomiting)   . PREMATURE VENTRICULAR CONTRACTIONS   . PULMONARY EMBOLISM, HX OF 06/19/2007  . Rhabdomyolysis 07/29/2009  . Shortness of breath dyspnea   . Tremor   . VITAMIN D DEFICIENCY    Past Surgical History:  Procedure Laterality Date  . ABDOMINAL  HYSTERECTOMY    . BREAST LUMPECTOMY WITH RADIOACTIVE SEED LOCALIZATION Right 06/05/2015   Procedure: RIGHT BREAST LUMPECTOMY WITH RADIOACTIVE SEED LOCALIZATION;  Surgeon: Fanny Skates, MD;  Location: [facility];  Service: General;  Laterality: Right;  . BREAST SURGERY Left    s/p mass removal  . CARDIOVERSION N/A 12/11/2014   Procedure: CARDIOVERSION;  Surgeon: Josue Hector, MD;  Location: [facility];  Service: Cardiovascular;  Laterality: N/A;  . COLONOSCOPY  10/22/2003, ?2006  . COLONOSCOPY WITH PROPOFOL N/A 01/15/2016   Procedure: COLONOSCOPY WITH PROPOFOL;  Surgeon: Gatha Mayer, MD;  Location: [facility];  Service: Endoscopy;  Laterality: N/A;  . ESOPHAGOGASTRODUODENOSCOPY (EGD) WITH PROPOFOL N/A 01/15/2016   Procedure: ESOPHAGOGASTRODUODENOSCOPY (EGD) WITH PROPOFOL;  Surgeon: Gatha Mayer, MD;  Location: [facility];  Service: Endoscopy;  Laterality: N/A;  . s/p left arm fracture with fall off chair    . skin graft to middle R finger  1975  . TEE WITHOUT CARDIOVERSION N/A 12/11/2014   Procedure: TRANSESOPHAGEAL ECHOCARDIOGRAM (TEE);  Surgeon: Josue Hector, MD;  Location: [facility] ENDOSCOPY;  Service: Cardiovascular;  Laterality: N/A;  . TONSILLECTOMY      reports that she quit smoking about 20 years ago. Her smoking use included Cigarettes. She has  a 35.00 pack-year smoking history. She has never used smokeless tobacco. She reports that she does not drink alcohol or use drugs. family history includes Asthma in her brother and son; Esophageal cancer in her brother; Heart disease in her mother; Liver disease in her mother. Allergies  Allergen Reactions  . Duloxetine Other (See Comments)    REACTION: rhabdomyolysis  . Penicillins Other (See Comments)    SYNCOPE Has patient had a PCN reaction causing immediate rash, facial/tongue/throat swelling, SOB or lightheadedness with hypotension: Yes Has patient had a PCN reaction causing severe rash involving mucus membranes or skin necrosis:  No Has patient had a PCN reaction that required hospitalization No Has patient had a PCN reaction occurring within the last 10 years: No If all of the above answers are "NO", then may proceed with Cephalosporin use.   . Latex Rash   Current Outpatient Prescriptions on File Prior to Visit  Medication Sig Dispense Refill  . acetaminophen (TYLENOL) 650 MG CR tablet Per bottle as needed    . albuterol (PROAIR HFA) 108 (90 Base) MCG/ACT inhaler Inhale 2 puffs into the lungs every 6 (six) hours as needed for wheezing or shortness of breath. 3 Inhaler 1  . atorvastatin (LIPITOR) 10 MG tablet Take 1 tablet (10 mg total) by mouth daily. 90 tablet 2  . budesonide-formoterol (SYMBICORT) 160-4.5 MCG/ACT inhaler Inhale 2 puffs into the lungs 2 (two) times daily. 3 Inhaler 2  . Calcium Carbonate-Vitamin D 600-400 MG-UNIT per tablet Take 1 tablet by mouth 2 (two) times daily.      . chlorpheniramine (CHLOR-TRIMETON) 4 MG tablet Take 4 mg by mouth every 4 (four) hours as needed for allergies.     Marland Kitchen dextromethorphan (DELSYM) 30 MG/5ML liquid 2 tsp twice daily as needed    . Dextromethorphan-Guaifenesin (MUCINEX DM MAXIMUM STRENGTH) 60-1200 MG TB12 Take 1 tablet by mouth every 12 (twelve) hours as needed (cough/congestion). With flutter    . diltiazem (CARDIZEM CD) 360 MG 24 hr capsule Take 1 capsule (360 mg total) by mouth daily. 90 capsule 3  . ELIQUIS 5 MG TABS tablet TAKE 1 TABLET TWICE A DAY 180 tablet 0  . ferrous sulfate 325 (65 FE) MG tablet Take 325 mg by mouth 2 (two) times daily with a meal.     . FLUoxetine (PROZAC) 40 MG capsule TAKE 1 CAPSULE DAILY 90 capsule 3  . fluticasone (FLONASE) 50 MCG/ACT nasal spray Place 2 sprays into both nostrils 2 (two) times daily as needed for allergies or rhinitis.    . furosemide (LASIX) 80 MG tablet Take 80 mg by mouth 2 (two) times daily. May take 1 extra daily as needed for leg swelling along with Klor Con    . glucosamine-chondroitin 500-400 MG tablet Take 1  tablet by mouth every morning.     Marland Kitchen KLOR-CON M20 20 MEQ tablet Take 1 tablet (20 mEq total) by mouth 2 (two) times daily. Please schedule appointment for refills. 180 tablet 0  . levalbuterol (XOPENEX) 1.25 MG/3ML nebulizer solution Take 1.25 mg by nebulization every 4 (four) hours as needed for wheezing or shortness of breath.    . levothyroxine (SYNTHROID, LEVOTHROID) 25 MCG tablet TAKE 1 TABLET DAILY BEFORE BREAKFAST 90 tablet 2  . omeprazole (PRILOSEC) 20 MG capsule TAKE 2 CAPSULES DAILY (Patient taking differently: Take 32ms before supper daily) 180 capsule 2  . OXYGEN Inhale 2.5 L/min into the lungs continuous.    .Marland Kitchenoxymetazoline (AFRIN) 0.05 % nasal spray 1 puff twice daily  x5 days as needed for stuffy nose - before nasonex    . predniSONE (DELTASONE) 10 MG tablet 4 tabs for 2 days, then 3 tabs for 2 days, 2 tabs for 2 days, then 1 tab for 2 days, then stop 20 tablet 0  . propafenone (RYTHMOL SR) 325 MG 12 hr capsule Take 1 capsule (325 mg total) by mouth 2 (two) times daily. 180 capsule 3  . sodium chloride (OCEAN) 0.65 % SOLN nasal spray Place 2 sprays into both nostrils every 4 (four) hours as needed for congestion.     . traMADol (ULTRAM) 50 MG tablet Take 1-2 tablets (50-100 mg total) by mouth every 4 (four) hours as needed. 40 tablet 0   No current facility-administered medications on file prior to visit.    Review of Systems  Constitutional: Negative for other unusual diaphoresis or sweats HENT: Negative for ear discharge or swelling Eyes: Negative for other worsening visual disturbances Respiratory: Negative for stridor or other swelling  Gastrointestinal: Negative for worsening distension or other blood Genitourinary: Negative for retention or other urinary change Musculoskeletal: Negative for other MSK pain or swelling Skin: Negative for color change or other new lesions Neurological: Negative for worsening tremors and other numbness  Psychiatric/Behavioral: Negative for  worsening agitation or other fatigue All other system neg per pt    Objective:   Physical Exam BP 138/82   Pulse (!) 120   Ht _0  (1.626 m)   Wt 257 lb (116.6 kg)   SpO2 (!) 80%   BMI 44.11 kg/m  VS noted, mild ill Constitutional: Pt appears in NAD HENT: Head: NCAT.  Right Ear: External ear normal.  Left Ear: External ear normal.  Eyes: . Pupils are equal, round, and reactive to light. Conjunctivae and EOM are normal Bilat tm's with mild erythema.  Max sinus areas non tender.  Pharynx with mild erythema, no exudate Nose: without d/c or deformity Neck: Neck supple. Gross normal ROM Cardiovascular: tachy rate and irregular rhythm.   Pulmonary/Chest: Effort normal and breath sounds decreased without rales or wheezing.  Neurological: Pt is alert. At baseline orientation, motor grossly intact Skin: Skin is warm. No rashes, other new lesions, no LE edema Psychiatric: Pt behavior is normal without agitation  No other exam findings Lab Results  Component Value Date   WBC 8.4 05/04/2016   HGB 12.0 05/04/2016   HCT 34.3 (L) 05/04/2016   PLT 260.0 05/04/2016   GLUCOSE 131 (H) 08/01/2015   CHOL 148 04/11/2015   TRIG 117.0 04/11/2015   HDL 41.20 04/11/2015   LDLDIRECT 140.2 04/10/2010   LDLCALC 84 04/11/2015   ALT 15 06/02/2015   AST 14 (L) 06/02/2015   NA 144 08/01/2015   K 4.0 08/01/2015   CL 101 08/01/2015   CREATININE 0.72 08/01/2015   BUN 9 08/01/2015   CO2 34 (H) 08/01/2015   TSH 5.05 (H) 04/11/2015   INR 1.6 08/14/2015   HGBA1C 5.4 04/11/2015   ECG today I have perosnally interpreted Atrial flutter at 130 BMP    Assessment & Plan:

## 2016-09-15 ENCOUNTER — Inpatient Hospital Stay (HOSPITAL_COMMUNITY): Payer: BLUE CROSS/BLUE SHIELD | Admitting: Anesthesiology

## 2016-09-15 ENCOUNTER — Encounter (HOSPITAL_COMMUNITY)
Admission: EM | Disposition: A | Discharge status: Home / Self Care | Payer: Self-pay | Source: Home / Self Care | Attending: Internal Medicine

## 2016-09-15 ENCOUNTER — Encounter (HOSPITAL_COMMUNITY): Payer: Self-pay | Admitting: Anesthesiology

## 2016-09-15 ENCOUNTER — Other Ambulatory Visit: Payer: Self-pay

## 2016-09-15 ENCOUNTER — Inpatient Hospital Stay (HOSPITAL_COMMUNITY): Payer: BLUE CROSS/BLUE SHIELD

## 2016-09-15 DIAGNOSIS — J9621 Acute and chronic respiratory failure with hypoxia: Secondary | ICD-10-CM

## 2016-09-15 DIAGNOSIS — I10 Essential (primary) hypertension: Secondary | ICD-10-CM

## 2016-09-15 DIAGNOSIS — J441 Chronic obstructive pulmonary disease with (acute) exacerbation: Secondary | ICD-10-CM

## 2016-09-15 DIAGNOSIS — Z7901 Long term (current) use of anticoagulants: Secondary | ICD-10-CM

## 2016-09-15 DIAGNOSIS — I4892 Unspecified atrial flutter: Secondary | ICD-10-CM

## 2016-09-15 DIAGNOSIS — I5032 Chronic diastolic (congestive) heart failure: Secondary | ICD-10-CM

## 2016-09-15 HISTORY — PX: CARDIOVERSION: SHX1299

## 2016-09-15 LAB — URINALYSIS, ROUTINE W REFLEX MICROSCOPIC
Bilirubin Urine: NEGATIVE
Glucose, UA: >=500 mg/dL — AB
Hgb urine dipstick: NEGATIVE
Ketones, ur: NEGATIVE mg/dL
Nitrite: NEGATIVE
Protein, ur: NEGATIVE mg/dL
Specific Gravity, Urine: 1.021 (ref 1.005–1.030)
pH: 5 (ref 5.0–8.0)

## 2016-09-15 LAB — TSH: TSH: 0.993 u[IU]/mL (ref 0.350–4.500)

## 2016-09-15 LAB — TROPONIN I
Troponin I: <0.03 ng/mL (ref <0.03)
Troponin I: <0.03 ng/mL (ref <0.03)

## 2016-09-15 SURGERY — CARDIOVERSION
Anesthesia: General

## 2016-09-15 MED ORDER — SODIUM CHLORIDE 0.9% FLUSH: 3.0000 mL | INTRAVENOUS | Status: DC | PRN

## 2016-09-15 MED ORDER — FUROSEMIDE 80 MG PO TABS: 80.0000 mg | ORAL_TABLET | Freq: Every day | ORAL | Status: DC

## 2016-09-15 MED ORDER — HYDROCORTISONE 1 % EX CREA
1.0000 "application " | TOPICAL_CREAM | Freq: Three times a day (TID) | CUTANEOUS | Status: DC | PRN
  Filled 2016-09-15: qty 28

## 2016-09-15 MED ORDER — DM-GUAIFENESIN ER 30-600 MG PO TB12: 2.0000 | ORAL_TABLET | Freq: Two times a day (BID) | ORAL | Status: DC | PRN

## 2016-09-15 MED ORDER — ACETAMINOPHEN 325 MG PO TABS: 650.0000 mg | ORAL_TABLET | Freq: Four times a day (QID) | ORAL | Status: DC | PRN

## 2016-09-15 MED ORDER — TRAMADOL HCL 50 MG PO TABS: 50.0000 mg | ORAL_TABLET | Freq: Four times a day (QID) | ORAL | Status: DC | PRN

## 2016-09-15 MED ORDER — POTASSIUM CHLORIDE CRYS ER 20 MEQ PO TBCR
20.0000 meq | EXTENDED_RELEASE_TABLET | Freq: Two times a day (BID) | ORAL | Status: DC
  Administered 2016-09-15 – 2016-09-16 (×3): 20 meq via ORAL
  Filled 2016-09-15 (×3): qty 1

## 2016-09-15 MED ORDER — MOMETASONE FURO-FORMOTEROL FUM 200-5 MCG/ACT IN AERO
2.0000 | INHALATION_SPRAY | Freq: Two times a day (BID) | RESPIRATORY_TRACT | Status: DC
  Administered 2016-09-15 – 2016-09-16 (×2): 2 via RESPIRATORY_TRACT
  Filled 2016-09-15: qty 8.8

## 2016-09-15 MED ORDER — LEVOTHYROXINE SODIUM 25 MCG PO TABS
25.0000 ug | ORAL_TABLET | Freq: Every day | ORAL | Status: DC
  Administered 2016-09-15 – 2016-09-16 (×2): 25 ug via ORAL
  Filled 2016-09-15 (×2): qty 1

## 2016-09-15 MED ORDER — SODIUM CHLORIDE 0.9% FLUSH
3.0000 mL | Freq: Two times a day (BID) | INTRAVENOUS | Status: DC
  Administered 2016-09-15: 3 mL via INTRAVENOUS

## 2016-09-15 MED ORDER — LEVALBUTEROL HCL 0.63 MG/3ML IN NEBU: 0.6300 mg | INHALATION_SOLUTION | Freq: Three times a day (TID) | RESPIRATORY_TRACT | Status: DC

## 2016-09-15 MED ORDER — FUROSEMIDE 80 MG PO TABS
120.0000 mg | ORAL_TABLET | Freq: Every day | ORAL | Status: DC
  Administered 2016-09-15 – 2016-09-16 (×2): 120 mg via ORAL
  Filled 2016-09-15 (×2): qty 1

## 2016-09-15 MED ORDER — ATORVASTATIN CALCIUM 10 MG PO TABS
10.0000 mg | ORAL_TABLET | Freq: Every day | ORAL | Status: DC
  Administered 2016-09-15 – 2016-09-16 (×2): 10 mg via ORAL
  Filled 2016-09-15 (×2): qty 1

## 2016-09-15 MED ORDER — FLUOXETINE HCL 20 MG PO CAPS
40.0000 mg | ORAL_CAPSULE | Freq: Every day | ORAL | Status: DC
  Administered 2016-09-15 – 2016-09-16 (×2): 40 mg via ORAL
  Filled 2016-09-15 (×2): qty 2

## 2016-09-15 MED ORDER — IPRATROPIUM BROMIDE 0.02 % IN SOLN
0.5000 mg | Freq: Three times a day (TID) | RESPIRATORY_TRACT | Status: DC
  Administered 2016-09-15 – 2016-09-16 (×3): 0.5 mg via RESPIRATORY_TRACT
  Filled 2016-09-15 (×5): qty 2.5

## 2016-09-15 MED ORDER — PREDNISONE 20 MG PO TABS
60.0000 mg | ORAL_TABLET | Freq: Every day | ORAL | Status: DC
  Administered 2016-09-16: 60 mg via ORAL
  Filled 2016-09-15 (×2): qty 3

## 2016-09-15 MED ORDER — PANTOPRAZOLE SODIUM 40 MG PO TBEC
40.0000 mg | DELAYED_RELEASE_TABLET | Freq: Every day | ORAL | Status: DC
  Administered 2016-09-15 – 2016-09-16 (×2): 40 mg via ORAL
  Filled 2016-09-15 (×2): qty 1

## 2016-09-15 MED ORDER — FERROUS SULFATE 325 (65 FE) MG PO TABS
325.0000 mg | ORAL_TABLET | Freq: Two times a day (BID) | ORAL | Status: DC
  Administered 2016-09-15 – 2016-09-16 (×2): 325 mg via ORAL
  Filled 2016-09-15 (×3): qty 1

## 2016-09-15 MED ORDER — SODIUM CHLORIDE 0.9 % IV SOLN
250.0000 mL | INTRAVENOUS | Status: DC
  Administered 2016-09-15: 14:00:00 via INTRAVENOUS

## 2016-09-15 MED ORDER — DILTIAZEM HCL ER COATED BEADS 180 MG PO CP24
360.0000 mg | ORAL_CAPSULE | Freq: Every day | ORAL | Status: DC
  Administered 2016-09-15 – 2016-09-16 (×2): 360 mg via ORAL
  Filled 2016-09-15 (×2): qty 2

## 2016-09-15 MED ORDER — PROPAFENONE HCL ER 325 MG PO CP12
325.0000 mg | ORAL_CAPSULE | Freq: Two times a day (BID) | ORAL | Status: DC
  Administered 2016-09-15 – 2016-09-16 (×4): 325 mg via ORAL
  Filled 2016-09-15 (×4): qty 1

## 2016-09-15 MED ORDER — APIXABAN 5 MG PO TABS
5.0000 mg | ORAL_TABLET | Freq: Two times a day (BID) | ORAL | Status: DC
  Administered 2016-09-15 – 2016-09-16 (×3): 5 mg via ORAL
  Filled 2016-09-15 (×3): qty 1

## 2016-09-15 MED ORDER — LEVOFLOXACIN IN D5W 500 MG/100ML IV SOLN
500.0000 mg | Freq: Every day | INTRAVENOUS | Status: DC
  Administered 2016-09-15 (×2): 500 mg via INTRAVENOUS
  Filled 2016-09-15 (×2): qty 100

## 2016-09-15 MED ORDER — LIDOCAINE HCL (CARDIAC) 20 MG/ML IV SOLN
INTRAVENOUS | Status: DC | PRN
  Administered 2016-09-15: 80 mg via INTRAVENOUS

## 2016-09-15 MED ORDER — OLOPATADINE HCL 0.1 % OP SOLN
1.0000 [drp] | Freq: Two times a day (BID) | OPHTHALMIC | Status: DC
  Administered 2016-09-15 (×2): 1 [drp] via OPHTHALMIC
  Filled 2016-09-15: qty 5

## 2016-09-15 MED ORDER — IPRATROPIUM BROMIDE 0.02 % IN SOLN: 0.5000 mg | Freq: Three times a day (TID) | RESPIRATORY_TRACT | Status: DC

## 2016-09-15 MED ORDER — PROPOFOL 10 MG/ML IV BOLUS
INTRAVENOUS | Status: DC | PRN
  Administered 2016-09-15: 100 mg via INTRAVENOUS

## 2016-09-15 MED ORDER — LEVALBUTEROL HCL 0.63 MG/3ML IN NEBU
0.6300 mg | INHALATION_SOLUTION | Freq: Three times a day (TID) | RESPIRATORY_TRACT | Status: DC
  Administered 2016-09-15 – 2016-09-16 (×3): 0.63 mg via RESPIRATORY_TRACT
  Filled 2016-09-15 (×5): qty 3

## 2016-09-15 MED ORDER — LEVALBUTEROL HCL 0.63 MG/3ML IN NEBU: 0.6300 mg | INHALATION_SOLUTION | Freq: Four times a day (QID) | RESPIRATORY_TRACT | Status: DC | PRN

## 2016-09-15 NOTE — Interval H&P Note (Signed)
History and Physical Interval Note:  06/16/4401 4:74 PM  [name] [name]  has presented today for surgery, with the diagnosis of a fib  The various methods of treatment have been discussed with the patient and family. After consideration of risks, benefits and other options for treatment, the patient has consented to  Procedure(s): CARDIOVERSION (N/A) as a surgical intervention .  The patient's history has been reviewed, patient examined, no change in status, stable for surgery.  I have reviewed the patient's chart and labs.  Questions were answered to the patient's satisfaction.     [employer]

## 2016-09-15 NOTE — Progress Notes (Signed)
Progress Note  Patient Name: [name] [name] Date of Encounter: 09/15/2016  Primary Cardiologist: Hilty  Subjective   No chest pain, no shortness of breath, continued atrial flutter however.  Inpatient Medications    Scheduled Meds: . apixaban  5 mg Oral BID  . atorvastatin  10 mg Oral Daily  . ferrous sulfate  325 mg Oral BID WC  . FLUoxetine  40 mg Oral Daily  . furosemide  120 mg Oral Daily  . ipratropium  0.5 mg Nebulization TID  . levalbuterol  0.63 mg Nebulization TID  . levothyroxine  25 mcg Oral QAC breakfast  . mometasone-formoterol  2 puff Inhalation BID  . olopatadine  1 drop Both Eyes BID  . pantoprazole  40 mg Oral Daily  . potassium chloride SA  20 mEq Oral BID  . predniSONE  60 mg Oral Q breakfast  . propafenone  325 mg Oral BID   Continuous Infusions: . diltiazem (CARDIZEM) infusion 15 mg/hr (09/15/16 1156)  . levofloxacin (LEVAQUIN) IV Stopped (09/15/16 0215)   PRN Meds: dextromethorphan-guaiFENesin, traMADol   Vital Signs    Vitals:   09/15/16 0800 09/15/16 0834 09/15/16 0843 09/15/16 1159  BP: 106/81   123/74  Pulse: (!) 120   (!) 130  Resp: 14   19  Temp:    98.3 F (36.8 C)  TempSrc:    Oral  SpO2: 98% 97% 98% 97%  Weight:      Height:        Intake/Output Summary (Last 24 hours) at 09/15/16 1214 Last data filed at 09/15/16 0955  Gross per 24 hour  Intake              205 ml  Output              225 ml  Net              -20 ml   Filed Weights   09/15/16 0031 09/15/16 0444  Weight: 259 lb 11.2 oz (117.8 kg) 259 lb 11.2 oz (117.8 kg)    Telemetry    Atrial flutter with rapid ventricular response heart rate in the 130s range - Personally Reviewed  ECG    Atrial flutter with rapid ventricular response - Personally Reviewed  Physical Exam   GEN: No acute distress.  Obese Neck: No JVD Cardiac:  tachycardia regular, no murmurs, rubs, or gallops.  Respiratory: Clear to auscultation bilaterally. GI: Soft, nontender,  non-distended  MS: No edema; No deformity. Neuro:  Nonfocal  Psych: Normal affect   Labs    Chemistry Recent Labs Lab 09/14/16 1252  NA 143  K 3.7  CL 102  CO2 33*  GLUCOSE 101*  BUN 10  CREATININE 0.74  CALCIUM 8.5*  PROT 6.7  ALBUMIN 3.7  AST 14*  ALT 13*  ALKPHOS 82  BILITOT 1.3*  GFRNONAA >60  GFRAA >60  ANIONGAP 8     Hematology Recent Labs Lab 09/14/16 1252  WBC 8.3  RBC 4.00  HGB 11.8*  HCT 36.5  MCV 91.3  MCH 29.5  MCHC 32.3  RDW 16.7*  PLT 240    Cardiac Enzymes Recent Labs Lab 09/14/16 1542 09/15/16 0047 09/15/16 0707  TROPONINI <0.03 <0.03 <0.03   No results for input(s): TROPIPOC in the last 168 hours.   BNP Recent Labs Lab 09/14/16 1542  BNP 98.4     DDimer No results for input(s): DDIMER in the last 168 hours.   Radiology    Dg Chest  2 View  Result Date: 09/14/2016 CLINICAL DATA:  Productive cough and worsening shortness of breath. EXAM: CHEST  2 VIEW COMPARISON:  08/03/2016 FINDINGS: The cardio pericardial silhouette is enlarged. The lungs are clear without focal pneumonia, edema, pneumothorax or pleural effusion. Interstitial markings are diffusely coarsened with chronic features. The visualized bony structures of the thorax are intact. IMPRESSION: Stable exam. Chronic interstitial coarsening without acute cardiopulmonary findings. Electronically Signed   By: Misty Stanley M.D.   On: 09/14/2016 13:20    Cardiac Studies   04/06/13-normal stress test nuclear 11/16/11-echocardiogram-EF 65% with mild LAE enlargement   Patient Profile     66 y.o. female with atrial flutter rapid ventricular response on antiarrhythmic propafenone, morbid obesity  Assessment & Plan    Atrial flutter  - We will proceed with DC cardioversion - discussed risks and benefits with patient. Husband present.  - Wannetta Sender is looking into availability today  - She has not missed any dose of Eliquis  - She is continuing with propafenone  - She may need a  second ablative therapy in the future.  - Discussed risk factor such as obesity. She denies sleep apnea  Essential hypertension  - Currently well controlled  Acute upper respiratory infection, possible COPD exacerbation  - Hospitalist team  Chronic diastolic heart failure/morbid obesity  - No significant volume overload. No edema on x-ray.  - Continuing home Lasix.  Signed, Candee Furbish, MD  09/15/2016, 12:14 PM

## 2016-09-15 NOTE — Anesthesia Procedure Notes (Signed)
Date/Time: 09/15/2016 2:18 PM Performed by: Jenne Campus Pre-anesthesia Checklist: Patient identified, Emergency Drugs available, Suction available, Patient being monitored and Timeout performed Patient Re-evaluated:Patient Re-evaluated prior to induction Oxygen Delivery Method: Ambu bag Preoxygenation: Pre-oxygenation with 100% oxygen Induction Type: IV induction

## 2016-09-15 NOTE — Progress Notes (Signed)
Pt returned from endo. Vitals obtained. Pt denies pain. Will continue current plan of care.   Grant Fontana BSN, RN

## 2016-09-15 NOTE — Progress Notes (Addendum)
PROGRESS NOTE   [name] [name]  XVQ:[id number]    DOB: May 30, 1950    DOA: 09/14/2016  PCP: Biagio Borg, MD   I have briefly reviewed patients previous medical records in [employer]  Brief Narrative:  66 year old married female, independent of activities of daily living, PMH of COPD (Dr. Melvyn Novas, Pulmonology), chronic hypoxic respiratory failure on home oxygen (2.5 L when using her concentrator and 4 L when using portable device), PAF, chronic diastolic CHF, anxiety, depression, HTN, HLD, hypothyroid, morbid obesity, DVT/PE, former smoker, had been having ongoing cough for several weeks which worsened 3-4 days prior to admission, sent from PCPs office due to A. fib with RVR in the 130s-140s. Admitted for atrial flutter with RVR and COPD exacerbation. Cardiology consulted and plan DCCV 9/5.   Assessment & Plan:   Principal Problem:   Atrial fibrillation with RVR (HCC) Active Problems:   Severe obesity (BMI >= 40) (HCC)   Anxiety state   Depression   Essential hypertension   PAF (paroxysmal atrial fibrillation) (HCC)   Chronic respiratory failure with hypoxia and hypercapnia (HCC)   PULMONARY EMBOLISM, HX OF   DEEP VENOUS THROMBOPHLEBITIS, HX OF   Long term current use of anticoagulant therapy   Major depressive disorder, single episode, unspecified   Chronic diastolic CHF (congestive heart failure) ([place])   1. Atrial flutter with RVR: Likely precipitated by acute respiratory illness. Cardiology consultation and follow-up appreciated. Continued on home Propafenone 325 MG bid, Eliquis 5 mg BID. Titrated IV Cardizem infusion was added. Avoiding IV amiodarone due to Propafenone and avoiding beta blockers due to COPD exacerbation. TSH normal/0.993. Follow repeat 2-D echo. Despite these measures, remains in intermittent atrial flutter with RVR in the 130s. Cardiology plans DC cardioversion on 9/5 and indicate that she may need a second ablative therapy in the future. As per patient and  spouse, patient has had multiple DC cardioversions in the past. 2. COPD exacerbation: Precipitated by URTI and seasonal allergies. Continue oral prednisone, IV levofloxacin, Xopenex and Atrovent nebulizations & Dulera. Added flutter valve. Former smoker but quit almost 2 decades ago. Rapid strep screen negative. Blood cultures 2: Negative to date. 3. Acute on chronic hypoxic respiratory failure: Acute respiratory failure precipitated by COPD exacerbation. Management as indicated above. Wean to home level of oxygen as tolerated. 4. Chronic diastolic CHF: Clinically appears euvolemic. Continue home dose of furosemide. 5. Morbid obesity/Body mass index is 47.5 kg/m. Denies using CPAP at home. 6. Anemia: Follow CBCs. Continue ferrous sulfate. 7. Hyperlipidemia: Continue atorvastatin. 8. Anxiety and depression: Stable. Continue fluoxetine. 9. Hypothyroid: Continue Synthroid. TSH normal. 10. History of DVT and PE: Already on anticoagulation.   DVT prophylaxis: Apixaban Code Status: Full Family Communication: Discussed in detail with patient's spouse at bedside. Updated care and answered questions. Disposition: Admitted to stepdown. Continue management in stepdown for now. DC home when medically improved and stable.   Consultants:  Cardiology.   Procedures:  None  Antimicrobials:  IV levofloxacin    Subjective: States that she does not feel much different than yesterday. Cough with productive yellow sputum and dyspnea with mild exertion. No chest pain, palpitations, dizziness or lightheadedness.   ROS: No fever or chills. Hungry but NPO for possible cardioversion later today.  Objective:  Vitals:   09/15/16 0800 09/15/16 0834 09/15/16 0843 09/15/16 1159  BP: 106/81   123/74  Pulse: (!) 120   (!) 130  Resp: 14   19  Temp:    98.3 F (36.8 C)  TempSrc:    Oral  SpO2: 98% 97% 98% 97%  Weight:      Height:        Examination:  General exam: Pleasant middle-aged female,  moderately built and morbidly obese, lying comfortably propped up in bed. Respiratory system: Reduced breath sounds bilaterally with few bilateral expiratory rhonchi but no crackles. Respiratory effort normal. Able to speak in full sentences. Cardiovascular system: S1 & S2 heard, regular tachycardia. No JVD, murmurs, rubs, gallops or clicks. No pedal edema. Telemetry: Mostly atrial flutter with RVR in the 130s. Occasional he and transiently heart rate will drop to the 90s. Gastrointestinal system: Abdomen is nondistended, soft and nontender. No organomegaly or masses felt. Normal bowel sounds heard. Central nervous system: Alert and oriented. No focal neurological deficits. Extremities: Symmetric 5 x 5 power. Skin: No rashes, lesions or ulcers Psychiatry: Judgement and insight appear normal. Mood & affect appropriate.     Data Reviewed: I have personally reviewed following labs and imaging studies  CBC:  Recent Labs Lab 09/14/16 1252  WBC 8.3  NEUTROABS 5.7  HGB 11.8*  HCT 36.5  MCV 91.3  PLT 542   Basic Metabolic Panel:  Recent Labs Lab 09/14/16 1252  NA 143  K 3.7  CL 102  CO2 33*  GLUCOSE 101*  BUN 10  CREATININE 0.74  CALCIUM 8.5*   Liver Function Tests:  Recent Labs Lab 09/14/16 1252  AST 14*  ALT 13*  ALKPHOS 82  BILITOT 1.3*  PROT 6.7  ALBUMIN 3.7   Coagulation Profile:  Recent Labs Lab 09/14/16 1252  INR 1.13   Cardiac Enzymes:  Recent Labs Lab 09/14/16 1542 09/15/16 0047 09/15/16 0707  TROPONINI <0.03 <0.03 <0.03   HbA1C: No results for input(s): HGBA1C in the last 72 hours. CBG: No results for input(s): GLUCAP in the last 168 hours.  Recent Results (from the past 240 hour(s))  Rapid strep screen     Status: None   Collection Time: 09/14/16 12:56 PM  Result Value Ref Range Status   Streptococcus, Group A Screen (Direct) NEGATIVE NEGATIVE Final    Comment: (NOTE) A Rapid Antigen test may result negative if the antigen level in  the sample is below the detection level of this test. The FDA has not cleared this test as a stand-alone test therefore the rapid antigen negative result has reflexed to a Group A Strep culture.   Culture, group A strep     Status: None (Preliminary result)   Collection Time: 09/14/16 12:56 PM  Result Value Ref Range Status   Specimen Description THROAT  Final   Special Requests NONE Reflexed from [phone]  Final   Culture CULTURE REINCUBATED FOR BETTER GROWTH  Final   Report Status PENDING  Incomplete  Culture, blood (Routine x 2)     Status: None (Preliminary result)   Collection Time: 09/14/16  1:45 PM  Result Value Ref Range Status   Specimen Description BLOOD RIGHT ANTECUBITAL  Final   Special Requests   Final    BOTTLES DRAWN AEROBIC AND ANAEROBIC Blood Culture adequate volume   Culture NO GROWTH < 24 HOURS  Final   Report Status PENDING  Incomplete  Culture, blood (Routine x 2)     Status: None (Preliminary result)   Collection Time: 09/14/16  2:00 PM  Result Value Ref Range Status   Specimen Description BLOOD RIGHT HAND  Final   Special Requests   Final    BOTTLES DRAWN AEROBIC AND ANAEROBIC Blood Culture adequate volume  Culture NO GROWTH < 24 HOURS  Final   Report Status PENDING  Incomplete         Radiology Studies: Dg Chest 2 View  Result Date: 09/14/2016 CLINICAL DATA:  Productive cough and worsening shortness of breath. EXAM: CHEST  2 VIEW COMPARISON:  08/03/2016 FINDINGS: The cardio pericardial silhouette is enlarged. The lungs are clear without focal pneumonia, edema, pneumothorax or pleural effusion. Interstitial markings are diffusely coarsened with chronic features. The visualized bony structures of the thorax are intact. IMPRESSION: Stable exam. Chronic interstitial coarsening without acute cardiopulmonary findings. Electronically Signed   By: Misty Stanley M.D.   On: 09/14/2016 13:20        Scheduled Meds: . apixaban  5 mg Oral BID  . atorvastatin  10  mg Oral Daily  . ferrous sulfate  325 mg Oral BID WC  . FLUoxetine  40 mg Oral Daily  . furosemide  120 mg Oral Daily  . ipratropium  0.5 mg Nebulization TID  . levalbuterol  0.63 mg Nebulization TID  . levothyroxine  25 mcg Oral QAC breakfast  . mometasone-formoterol  2 puff Inhalation BID  . olopatadine  1 drop Both Eyes BID  . pantoprazole  40 mg Oral Daily  . potassium chloride SA  20 mEq Oral BID  . predniSONE  60 mg Oral Q breakfast  . propafenone  325 mg Oral BID   Continuous Infusions: . diltiazem (CARDIZEM) infusion 15 mg/hr (09/15/16 1156)  . levofloxacin (LEVAQUIN) IV Stopped (09/15/16 0215)     LOS: 1 day     Juliett Eastburn, MD, FACP, FHM. [facility] Pager [phone]  If 7PM-7AM, please contact night-coverage [email or web address] Password [email or web address] 09/15/2016, 12:40 PM

## 2016-09-15 NOTE — H&P (View-Only) (Signed)
   We'll be able to perform cardioversion today at approximately 2-3pm in endoscopy with the help of anesthesia.  Continue to keep her nothing by mouth.  Candee Furbish, MD

## 2016-09-15 NOTE — Progress Notes (Signed)
   We'll be able to perform cardioversion today at approximately 2-3pm in endoscopy with the help of anesthesia.  Continue to keep her nothing by mouth.  Candee Furbish, MD

## 2016-09-15 NOTE — Anesthesia Preprocedure Evaluation (Addendum)
Anesthesia Evaluation  Patient identified by MRN, date of birth, ID band Patient awake    Reviewed: Allergy & Precautions, NPO status , Patient's Chart, lab work & pertinent test results  Airway Mallampati: II  TM Distance: >3 FB Neck ROM: Full    Dental no notable dental hx. (+) Teeth Intact, Dental Advisory Given   Pulmonary asthma , COPD,  COPD inhaler and oxygen dependent, former smoker,    Pulmonary exam normal breath sounds clear to auscultation       Cardiovascular hypertension, Pt. on medications and Pt. on home beta blockers + Peripheral Vascular Disease, +CHF and + DVT  Normal cardiovascular exam+ dysrhythmias Atrial Fibrillation  Rhythm:Regular Rate:Tachycardia     Neuro/Psych    GI/Hepatic GERD  Medicated and Controlled,  Endo/Other  Morbid obesity  Renal/GU      Musculoskeletal   Abdominal   Peds  Hematology   Anesthesia Other Findings   Reproductive/Obstetrics                            Anesthesia Physical Anesthesia Plan  ASA: III  Anesthesia Plan: General   Post-op Pain Management:    Induction: Intravenous  PONV Risk Score and Plan:   Airway Management Planned: Natural Airway  Additional Equipment:   Intra-op Plan:   Post-operative Plan:   Informed Consent: I have reviewed the patients History and Physical, chart, labs and discussed the procedure including the risks, benefits and alternatives for the proposed anesthesia with the patient or authorized representative who has indicated his/her understanding and acceptance.   Dental advisory given  Plan Discussed with: CRNA and Anesthesiologist  Anesthesia Plan Comments:         Anesthesia Quick Evaluation

## 2016-09-15 NOTE — CV Procedure (Signed)
    Electrical Cardioversion Procedure Note [name] [name] [id number] June 25, 1950  Procedure: Electrical Cardioversion Indications:  Atrial Flutter  Time Out: Verified patient identification, verified procedure,medications/allergies/relevent history reviewed, required imaging and test results available.  Performed  Procedure Details  The patient was NPO after midnight. Anesthesia was administered at the beside  by Dr.Miller with 100mg  of propofol.  Cardioversion was performed with synchronized biphasic defibrillation via AP pads with 120 joules.  1 attempt(s) were performed.  The patient converted to normal sinus rhythm. The patient tolerated the procedure well   IMPRESSION:  Successful cardioversion of atrial flutter. Continue home diltiazem and propafenone. May need another ablation.  Should be OK to be discharged this evening if feeling well post cardioversion.    [name] [name] 09/15/2016, 2:25 PM

## 2016-09-15 NOTE — Progress Notes (Signed)
pt stated that she mentioned to nurse thaat if RT wasnt in room by 2100, that she wasnt going to take any meds. RT never received a call.

## 2016-09-15 NOTE — Transfer of Care (Signed)
Immediate Anesthesia Transfer of Care Note  Patient: [name] [name]  Procedure(s) Performed: Procedure(s): CARDIOVERSION (N/A)  Patient Location: Endoscopy Unit  Anesthesia Type:General  Level of Consciousness: awake, oriented and patient cooperative  Airway & Oxygen Therapy: Patient Spontanous Breathing and Patient connected to nasal cannula oxygen  Post-op Assessment: Report given to RN and Post -op Vital signs reviewed and stable  Post vital signs: Reviewed  Last Vitals:  Vitals:   09/15/16 1159 09/15/16 1356  BP: 123/74 133/86  Pulse: (!) 130 (!) 133  Resp: 19 19  Temp: 36.8 C 36.7 C  SpO2: 97% 96%    Last Pain:  Vitals:   09/15/16 1356  TempSrc: Oral  PainSc:          Complications: No apparent anesthesia complications

## 2016-09-16 ENCOUNTER — Inpatient Hospital Stay (HOSPITAL_COMMUNITY): Payer: BLUE CROSS/BLUE SHIELD

## 2016-09-16 LAB — CBC
HCT: 31.4 % — ABNORMAL LOW (ref 36.0–46.0)
Hemoglobin: 10.3 g/dL — ABNORMAL LOW (ref 12.0–15.0)
MCH: 29.7 pg (ref 26.0–34.0)
MCHC: 32.8 g/dL (ref 30.0–36.0)
MCV: 90.5 fL (ref 78.0–100.0)
Platelets: 182 10*3/uL (ref 150–400)
RBC: 3.47 MIL/uL — ABNORMAL LOW (ref 3.87–5.11)
RDW: 16.4 % — ABNORMAL HIGH (ref 11.5–15.5)
WBC: 5.9 10*3/uL (ref 4.0–10.5)

## 2016-09-16 LAB — CULTURE, GROUP A STREP (THRC)

## 2016-09-16 MED ORDER — LEVALBUTEROL HCL 1.25 MG/3ML IN NEBU: 1.2500 mg | INHALATION_SOLUTION | RESPIRATORY_TRACT | 0 refills | Status: DC | PRN

## 2016-09-16 MED ORDER — LEVOFLOXACIN 500 MG PO TABS: 500.0000 mg | ORAL_TABLET | Freq: Every day | ORAL | Status: DC

## 2016-09-16 MED ORDER — FUROSEMIDE 80 MG PO TABS: 80.0000 mg | ORAL_TABLET | Freq: Two times a day (BID) | ORAL | Status: DC

## 2016-09-16 MED ORDER — DOXYCYCLINE HYCLATE 100 MG PO TABS: 100.0000 mg | ORAL_TABLET | Freq: Two times a day (BID) | ORAL | 0 refills | Status: DC

## 2016-09-16 MED ORDER — PREDNISONE 10 MG PO TABS: ORAL_TABLET | ORAL | 0 refills | Status: DC

## 2016-09-16 MED ORDER — OMEPRAZOLE 20 MG PO CPDR: DELAYED_RELEASE_CAPSULE | ORAL | Status: DC

## 2016-09-16 NOTE — Anesthesia Postprocedure Evaluation (Signed)
Anesthesia Post Note  Patient: [name] [name]  Procedure(s) Performed: Procedure(s) (LRB): CARDIOVERSION (N/A)     Patient location during evaluation: PACU Anesthesia Type: General Level of consciousness: awake and alert Pain management: pain level controlled Vital Signs Assessment: post-procedure vital signs reviewed and stable Respiratory status: spontaneous breathing, nonlabored ventilation and respiratory function stable Cardiovascular status: blood pressure returned to baseline and stable Postop Assessment: no signs of nausea or vomiting Anesthetic complications: no    Last Vitals:  Vitals:   09/16/16 0020 09/16/16 0421  BP: 121/68 124/66  Pulse: (!) 106 85  Resp: (!) 22 14  Temp: 36.8 C 36.6 C  SpO2: 99% 99%    Last Pain:  Vitals:   09/16/16 0421  TempSrc: Oral  PainSc:                  Lynda Rainwater

## 2016-09-16 NOTE — Progress Notes (Signed)
Discharge instructions (including medications) discussed with and copy provided to patient/caregiver 

## 2016-09-16 NOTE — Care Management Note (Signed)
Case Management Note Marvetta Gibbons RN, BSN Unit 4E-Case Manager [phone]  Patient Details  Name: [name] [name] MRN: [id number] Date of Birth: 12/02/1950  Subjective/Objective:   Pt admitted with afib- s/p cardioversion                  Action/Plan: PTA pt lived at home with spouse- as home 02- plan to return home- no CM needs noted for discharge.   Expected Discharge Date:  09/16/16               Expected Discharge Plan:  Home/Self Care  In-House Referral:  NA  Discharge planning Services  CM Consult  Post Acute Care Choice:  NA Choice offered to:  NA  DME Arranged:    DME Agency:     HH Arranged:    HH Agency:     Status of Service:  Completed, signed off  If discussed at [place] of Stay Meetings, dates discussed:    Discharge Disposition: home;self care   Additional Comments:  Dawayne Patricia, RN 09/16/2016, 2:15 PM

## 2016-09-16 NOTE — Discharge Summary (Addendum)
Physician Discharge Summary  [name] [name] GYJ:[id number] DOB: 22-Jan-1950  PCP: Biagio Borg, MD   The patient was actually discharged from the hospital on 09/16/16. However date of service on top of this note shows 09/15/16. I have made several attempts to fix this without success and I have tried to redo the discharge summary today 09/16/16 but date of service still appears as 09/15/16.  Admit date: 09/14/2016 Discharge date: 09/16/2016  Recommendations for Outpatient Follow-up:  1. Dr. Cathlean Cower, PCP on 09/17/16 with repeat labs (CBC & BMP). Follow-up appointment in 4 days was requested but patient apparently has gotten an earlier appointment. Please follow final blood culture results that were sent from the hospital. 2. Dr. Lyman Bishop, Cardiology and patient advised to call for appointment. 3. Dr. Christinia Gully, Pulmonology in one week.  Home Health: None Equipment/Devices: None    Discharge Condition: Improved and stable  CODE STATUS: Full  Diet recommendation: Heart healthy diet.  Discharge Diagnoses:  Principal Problem:   Atrial fibrillation with RVR (HCC) Active Problems:   Severe obesity (BMI >= 40) (HCC)   Anxiety state   Depression   Essential hypertension   PAF (paroxysmal atrial fibrillation) (HCC)   Chronic respiratory failure with hypoxia and hypercapnia (HCC)   PULMONARY EMBOLISM, HX OF   DEEP VENOUS THROMBOPHLEBITIS, HX OF   Long term current use of anticoagulant therapy   Major depressive disorder, single episode, unspecified   Chronic diastolic CHF (congestive heart failure) (HCC)   Brief Summary: 66 year old married female, independent of activities of daily living, PMH of COPD (Dr. Melvyn Novas, Pulmonology), chronic hypoxic respiratory failure on home oxygen (2.5 L when using her concentrator and 4 L when using portable device), PAF, chronic diastolic CHF, anxiety, depression, HTN, HLD, hypothyroid, morbid obesity, DVT/PE, former smoker, had been having ongoing cough for  several weeks which worsened 3-4 days prior to admission, sent from PCPs office due to A. fib with RVR in the 130s-140s. Admitted for atrial flutter with RVR and COPD exacerbation. Cardiology consulted and patient underwent DCCV 9/5.    Assessment & Plan:   1. Atrial flutter with RVR: Likely precipitated by acute respiratory illness. Cardiology was consulted. Continued on home Propafenone 325 MG bid, Eliquis 5 mg BID. Titrated IV Cardizem infusion was added. Avoiding IV amiodarone due to Propafenone and avoiding beta blockers due to COPD exacerbation. TSH normal/0.993. Despite these measures, remained in intermittent atrial flutter with RVR in the 130s. As per patient and spouse, patient has had multiple DC cardioversions in the past. Cardiology then performed successful cardioversion of atrial flutter on 9/5 and recommended continuing prior home medications, felt that she may need another ablation and cleared patient for discharge with outpatient follow-up with primary Cardiologist in approximately a month's time. Discussed with Cardiology who did not see a need for repeating echo at this time. 2. COPD exacerbation: Precipitated by URTI and seasonal allergies. Patient was treated with oral prednisone, IV levofloxacin Xopenex and Atrovent nebulizations & Dulera. Added flutter valve. Former smoker but quit almost 2 decades ago. Rapid strep screen negative. Blood cultures 2: Negative to date. Clinically improved. Discharged home on oral prednisone taper, doxycycline to complete total 5 days of antibiotics (levofloxacin discontinued due to concern for prolonged QT as per cardiology input). Close outpatient follow-up with pulmonology and may consider consultation with allergist due to ongoing issues with severe allergies at home. 3. Acute on chronic hypoxic respiratory failure: Acute respiratory failure precipitated by COPD exacerbation. Management as indicated above. Acute  respiratory failure resolved.  Continue home oxygen. 4. Chronic diastolic CHF: Clinically appears euvolemic. Patient reported that she actually takes Lasix 80 mg 2 times daily and home medications were adjusted to reflect this. 5. Morbid obesity/Body mass index is 47.5 kg/m. Denies using CPAP at home. 6. Anemia: Continue ferrous sulfate. Hemoglobin has dropped slightly from 11.8-10.3 in the absence of overt bleeding. Suspect due to acute illness. Follow CBCs closely as outpatient. 7. Hyperlipidemia: Continue atorvastatin. 8. Anxiety and depression: Stable. Continue fluoxetine. 9. Hypothyroid: Continue Synthroid. TSH normal. 10. History of DVT and PE: Already on anticoagulation.   Consultants:  Cardiology.   Procedures:  Cardioversion of atrial flutter 09/5   Discharge Instructions  Discharge Instructions    (HEART FAILURE PATIENTS) Call MD:  Anytime you have any of the following symptoms: 1) 3 pound weight gain in 24 hours or 5 pounds in 1 week 2) shortness of breath, with or without a dry hacking cough 3) swelling in the hands, feet or stomach 4) if you have to sleep on extra pillows at night in order to breathe.    Complete by:  As directed    Call MD for:  difficulty breathing, headache or visual disturbances    Complete by:  As directed    Call MD for:  extreme fatigue    Complete by:  As directed    Call MD for:  persistant dizziness or light-headedness    Complete by:  As directed    Call MD for:  severe uncontrolled pain    Complete by:  As directed    Call MD for:  temperature >100.4    Complete by:  As directed    Diet - low sodium heart healthy    Complete by:  As directed    Increase activity slowly    Complete by:  As directed        Medication List    STOP taking these medications   chlorpheniramine 4 MG tablet Commonly known as:  CHLOR-TRIMETON   DELSYM 30 MG/5ML liquid Generic drug:  dextromethorphan   levofloxacin 500 MG tablet Commonly known as:  LEVAQUIN   oxymetazoline 0.05  % nasal spray Commonly known as:  AFRIN   PRESCRIPTION MEDICATION   sodium chloride 0.65 % Soln nasal spray Commonly known as:  OCEAN     TAKE these medications   acetaminophen 650 MG CR tablet Commonly known as:  TYLENOL Per bottle as needed   albuterol 108 (90 Base) MCG/ACT inhaler Commonly known as:  PROAIR HFA Inhale 2 puffs into the lungs every 6 (six) hours as needed for wheezing or shortness of breath.   atorvastatin 10 MG tablet Commonly known as:  LIPITOR Take 1 tablet (10 mg total) by mouth daily.   budesonide-formoterol 160-4.5 MCG/ACT inhaler Commonly known as:  SYMBICORT Inhale 2 puffs into the lungs 2 (two) times daily.   Calcium Carbonate-Vitamin D 600-400 MG-UNIT tablet Take 1 tablet by mouth 2 (two) times daily.   diltiazem 360 MG 24 hr capsule Commonly known as:  CARDIZEM CD Take 1 capsule (360 mg total) by mouth daily.   doxycycline 100 MG tablet Commonly known as:  VIBRA-TABS Take 1 tablet (100 mg total) by mouth 2 (two) times daily.   ELIQUIS 5 MG Tabs tablet Generic drug:  apixaban TAKE 1 TABLET TWICE A DAY   ferrous sulfate 325 (65 FE) MG tablet Take 325 mg by mouth 2 (two) times daily with a meal.   FLUoxetine 40 MG capsule Commonly  known as:  PROZAC TAKE 1 CAPSULE DAILY   fluticasone 50 MCG/ACT nasal spray Commonly known as:  FLONASE Place 2 sprays into both nostrils 2 (two) times daily as needed for allergies or rhinitis.   furosemide 80 MG tablet Commonly known as:  LASIX Take 1 tablet (80 mg total) by mouth 2 (two) times daily. What changed:  additional instructions   glucosamine-chondroitin 500-400 MG tablet Take 1 tablet by mouth every morning.   KLOR-CON M20 20 MEQ tablet Generic drug:  potassium chloride SA Take 1 tablet (20 mEq total) by mouth 2 (two) times daily. Please schedule appointment for refills.   levalbuterol 1.25 MG/3ML nebulizer solution Commonly known as:  XOPENEX Take 1.25 mg by nebulization every 4  (four) hours as needed for wheezing or shortness of breath.   levothyroxine 25 MCG tablet Commonly known as:  SYNTHROID, LEVOTHROID TAKE 1 TABLET DAILY BEFORE BREAKFAST   MUCINEX DM MAXIMUM STRENGTH 60-1200 MG Tb12 Take 1 tablet by mouth every 12 (twelve) hours as needed (cough/congestion). With flutter   omeprazole 20 MG capsule Commonly known as:  PRILOSEC Take 20mg s before supper daily   OXYGEN Inhale 2.5 L/min into the lungs continuous.   PATADAY 0.2 % Soln Generic drug:  Olopatadine HCl Place 1 drop into both eyes daily.   predniSONE 10 MG tablet Commonly known as:  DELTASONE 4 tabs daily for 2 days, then 3 tabs daily for 2 days, 2 tabs daily for 2 days, then 1 tab daily for 2 days, then stop What changed:  additional instructions   propafenone 325 MG 12 hr capsule Commonly known as:  RYTHMOL SR Take 1 capsule (325 mg total) by mouth 2 (two) times daily.   traMADol 50 MG tablet Commonly known as:  ULTRAM Take 1-2 tablets (50-100 mg total) by mouth every 4 (four) hours as needed.      Follow-up Information    Biagio Borg, MD. Schedule an appointment as soon as possible for a visit in 4 day(s).   Specialties:  Internal Medicine, Radiology Why:  To be seen with repeat labs (CBC & BMP). Contact information: [place] [address] [phone]        Pixie Casino, MD. Schedule an appointment as soon as possible for a visit.   Specialty:  Cardiology Contact information: [address] [place] [address] [phone]        Tanda Rockers, MD. Schedule an appointment as soon as possible for a visit in 1 week(s).   Specialty:  Pulmonary Disease Contact information: [address] [address] [phone]          Allergies  Allergen Reactions  . Duloxetine Other (See Comments)    REACTION: rhabdomyolysis  . Penicillins Other (See Comments)    SYNCOPE Has patient had a PCN reaction causing  immediate rash, facial/tongue/throat swelling, SOB or lightheadedness with hypotension: Yes Has patient had a PCN reaction causing severe rash involving mucus membranes or skin necrosis: No Has patient had a PCN reaction that required hospitalization No Has patient had a PCN reaction occurring within the last 10 years: No If all of the above answers are "NO", then may proceed with Cephalosporin use.   . Latex Rash      Procedures/Studies: Dg Chest 2 View  Result Date: 09/14/2016 CLINICAL DATA:  Productive cough and worsening shortness of breath. EXAM: CHEST  2 VIEW COMPARISON:  08/03/2016 FINDINGS: The cardio pericardial silhouette is enlarged.  The lungs are clear without focal pneumonia, edema, pneumothorax or pleural effusion. Interstitial markings are diffusely coarsened with chronic features. The visualized bony structures of the thorax are intact. IMPRESSION: Stable exam. Chronic interstitial coarsening without acute cardiopulmonary findings. Electronically Signed   By: Misty Stanley M.D.   On: 09/14/2016 13:20      Subjective: Feels much better. Ongoing intermittent mild cough with yellow sputum but better than before. Dyspnea improved and states that her breathing is almost back to baseline. No chest pain, palpitations, fever or chills. Anxious to go home.  Discharge Exam:  Vitals:   09/16/16 0020 09/16/16 0421 09/16/16 0843 09/16/16 1234  BP: 121/68 124/66 (!) 143/72 126/72  Pulse: (!) 106 85 87 88  Resp: (!) 22 14 18 18   Temp: 98.2 F (36.8 C) 97.9 F (36.6 C) 97.8 F (36.6 C) 98.4 F (36.9 C)  TempSrc: Oral Oral Oral Oral  SpO2: 99% 99% 98% 96%  Weight:  117.2 kg (258 lb 6.4 oz)    Height:        General exam: Pleasant middle-aged female, moderately built and morbidly obese, lying comfortably propped up in bed. Respiratory system: Much improved breath sounds bilaterally. Occasional rhonchi in the bases posteriorly but rest of the lung fields clear to auscultation  without wheezing, rhonchi or crackles. No increased work of breathing. Cardiovascular system: S1 & S2 heard, RRR. No JVD, murmurs, rubs, gallops or clicks. No pedal edema. Telemetry: Sinus rhythm since cardioversion on 9/5. Gastrointestinal system: Abdomen is nondistended, soft and nontender. No organomegaly or masses felt. Normal bowel sounds heard. Central nervous system: Alert and oriented. No focal neurological deficits. Extremities: Symmetric 5 x 5 power. Skin: No rashes, lesions or ulcers Psychiatry: Judgement and insight appear normal. Mood & affect appropriate.     The results of significant diagnostics from this hospitalization (including imaging, microbiology, ancillary and laboratory) are listed below for reference.     Microbiology: Recent Results (from the past 240 hour(s))  Rapid strep screen     Status: None   Collection Time: 09/14/16 12:56 PM  Result Value Ref Range Status   Streptococcus, Group A Screen (Direct) NEGATIVE NEGATIVE Final    Comment: (NOTE) A Rapid Antigen test may result negative if the antigen level in the sample is below the detection level of this test. The FDA has not cleared this test as a stand-alone test therefore the rapid antigen negative result has reflexed to a Group A Strep culture.   Culture, group A strep     Status: None   Collection Time: 09/14/16 12:56 PM  Result Value Ref Range Status   Specimen Description THROAT  Final   Special Requests NONE Reflexed from [phone]  Final   Culture NO GROUP A STREP (S.PYOGENES) ISOLATED  Final   Report Status 09/16/2016 FINAL  Final  Culture, blood (Routine x 2)     Status: None (Preliminary result)   Collection Time: 09/14/16  1:45 PM  Result Value Ref Range Status   Specimen Description BLOOD RIGHT ANTECUBITAL  Final   Special Requests   Final    BOTTLES DRAWN AEROBIC AND ANAEROBIC Blood Culture adequate volume   Culture NO GROWTH 2 DAYS  Final   Report Status PENDING  Incomplete  Culture,  blood (Routine x 2)     Status: None (Preliminary result)   Collection Time: 09/14/16  2:00 PM  Result Value Ref Range Status   Specimen Description BLOOD RIGHT HAND  Final   Special Requests  Final    BOTTLES DRAWN AEROBIC AND ANAEROBIC Blood Culture adequate volume   Culture NO GROWTH 2 DAYS  Final   Report Status PENDING  Incomplete     Labs: CBC:  Recent Labs Lab 09/14/16 1252 09/16/16 0238  WBC 8.3 5.9  NEUTROABS 5.7  --   HGB 11.8* 10.3*  HCT 36.5 31.4*  MCV 91.3 90.5  PLT 240 253   Basic Metabolic Panel:  Recent Labs Lab 09/14/16 1252  NA 143  K 3.7  CL 102  CO2 33*  GLUCOSE 101*  BUN 10  CREATININE 0.74  CALCIUM 8.5*   Liver Function Tests:  Recent Labs Lab 09/14/16 1252  AST 14*  ALT 13*  ALKPHOS 82  BILITOT 1.3*  PROT 6.7  ALBUMIN 3.7   BNP (last 3 results)  Recent Labs  09/14/16 1542  BNP 98.4   Cardiac Enzymes:  Recent Labs Lab 09/14/16 1542 09/15/16 0047 09/15/16 0707  TROPONINI <0.03 <0.03 <0.03   Thyroid function studies  Recent Labs  09/15/16 0047  TSH 0.993   Urinalysis    Component Value Date/Time   COLORURINE YELLOW 09/14/2016 0040   APPEARANCEUR HAZY (A) 09/14/2016 0040   LABSPEC 1.021 09/14/2016 0040   PHURINE 5.0 09/14/2016 0040   GLUCOSEU >=500 (A) 09/14/2016 0040   GLUCOSEU NEGATIVE 04/11/2015 1011   HGBUR NEGATIVE 09/14/2016 0040   HGBUR negative 08/06/2008 1214   BILIRUBINUR NEGATIVE 09/14/2016 0040   BILIRUBINUR NEG 04/24/2012 1156   KETONESUR NEGATIVE 09/14/2016 0040   PROTEINUR NEGATIVE 09/14/2016 0040   UROBILINOGEN 2.0 (A) 04/11/2015 1011   NITRITE NEGATIVE 09/14/2016 0040   LEUKOCYTESUR MODERATE (A) 09/14/2016 0040    Advised patient that she should take all her home medication bottles during office visit with PCP for review. She verbalized understanding.  Time coordinating discharge: Over 30 minutes  SIGNED:  Vernell Leep, MD, FACP, [place]. [facility] Pager [phone]  If  7PM-7AM, please contact night-coverage [email or web address] Password [email or web address] 09/16/2016, 2:17 PM

## 2016-09-17 ENCOUNTER — Telehealth: Payer: Self-pay | Admitting: *Deleted

## 2016-09-17 ENCOUNTER — Other Ambulatory Visit (INDEPENDENT_AMBULATORY_CARE_PROVIDER_SITE_OTHER): Payer: BLUE CROSS/BLUE SHIELD

## 2016-09-17 ENCOUNTER — Ambulatory Visit (INDEPENDENT_AMBULATORY_CARE_PROVIDER_SITE_OTHER): Payer: BLUE CROSS/BLUE SHIELD | Admitting: Internal Medicine

## 2016-09-17 ENCOUNTER — Encounter: Payer: Self-pay | Admitting: Internal Medicine

## 2016-09-17 VITALS — BP 136/86 | HR 85 | Temp 98.5°F | Ht 62.0 in | Wt 260.0 lb

## 2016-09-17 DIAGNOSIS — I4892 Unspecified atrial flutter: Secondary | ICD-10-CM | POA: Diagnosis not present

## 2016-09-17 DIAGNOSIS — J441 Chronic obstructive pulmonary disease with (acute) exacerbation: Secondary | ICD-10-CM | POA: Diagnosis not present

## 2016-09-17 DIAGNOSIS — I5032 Chronic diastolic (congestive) heart failure: Secondary | ICD-10-CM | POA: Diagnosis not present

## 2016-09-17 LAB — CBC WITH DIFFERENTIAL/PLATELET
Basophils Absolute: 0.1 10*3/uL (ref 0.0–0.1)
Basophils Relative: 0.5 % (ref 0.0–3.0)
Eosinophils Absolute: 0 10*3/uL (ref 0.0–0.7)
Eosinophils Relative: 0 % (ref 0.0–5.0)
HCT: 32.6 % — ABNORMAL LOW (ref 36.0–46.0)
Hemoglobin: 11.3 g/dL — ABNORMAL LOW (ref 12.0–15.0)
Lymphocytes Relative: 13.6 % (ref 12.0–46.0)
Lymphs Abs: 1.5 10*3/uL (ref 0.7–4.0)
MCHC: 34.5 g/dL (ref 30.0–36.0)
MCV: 88 fl (ref 78.0–100.0)
Monocytes Absolute: 0.5 10*3/uL (ref 0.1–1.0)
Monocytes Relative: 4.2 % (ref 3.0–12.0)
Neutro Abs: 8.8 10*3/uL — ABNORMAL HIGH (ref 1.4–7.7)
Neutrophils Relative %: 81.7 % — ABNORMAL HIGH (ref 43.0–77.0)
Platelets: 236 10*3/uL (ref 150.0–400.0)
RBC: 3.71 Mil/uL — ABNORMAL LOW (ref 3.87–5.11)
RDW: 17.5 % — ABNORMAL HIGH (ref 11.5–15.5)
WBC: 10.8 10*3/uL — ABNORMAL HIGH (ref 4.0–10.5)

## 2016-09-17 LAB — BASIC METABOLIC PANEL
BUN: 14 mg/dL (ref 6–23)
CO2: 34 mEq/L — ABNORMAL HIGH (ref 19–32)
Calcium: 9.1 mg/dL (ref 8.4–10.5)
Chloride: 99 mEq/L (ref 96–112)
Creatinine, Ser: 0.77 mg/dL (ref 0.40–1.20)
GFR: 79.64 mL/min (ref >60.00)
Glucose, Bld: 200 mg/dL — ABNORMAL HIGH (ref 70–99)
Potassium: 3.3 mEq/L — ABNORMAL LOW (ref 3.5–5.1)
Sodium: 142 mEq/L (ref 135–145)

## 2016-09-17 NOTE — Progress Notes (Signed)
Subjective:    Patient ID: [name] [name], female    DOB: 1950-08-03, 66 y.o.   MRN: [id number]  HPI  Here to f/u with husband after recent hospn 9/4-9/6 with aflutter in the setting of acute upper respiratory infection, COPD and chronic hypoxic respiratory failure. Denies worsening fever, cough, sob or wheezing.  Pt denies chest pain, orthopnea, PND, increased LE swelling, palpitations, dizziness or syncope.  Did have Cardioversion of persistent aflutter and states she has much more stamina with exertion and better color.  Pt is to be considered for ablation in f/u soon with Dr Hilty/cardiology.   Pt denies polydipsia, polyuria.  Pt denies new neurological symptoms such as new headache, or facial or extremity weakness or numbness No other new complaints.  Blood cultures from hospn remain negative.  Due for f/u cbc and bmp   Pt is very concerned her planned appt with cardiology is late october Past Medical History:  Diagnosis Date  . Anemia    hx  . Anxiety   . Arthritis   . Asthma   . Atrial fibrillation and flutter ([place])   . Atypical lobular hyperplasia of right breast 05/09/2015  . Breast CA ([place])    ?  Marland Kitchen CHF (congestive heart failure) ([place])   . Chronic rhinitis   . Complication of anesthesia   . COPD (chronic obstructive pulmonary disease) ([place])    [place]. PFTs 07/25/06 FEV1 55% ratio 53, DLC0 51% HFA 50% 12/16/2009  . Depression   . FRACTURE, RIB, RIGHT 06/18/2009  . Glucose intolerance (impaired glucose tolerance)    on steroids  . Heart murmur   . History of DVT (deep vein thrombosis)   . Hx of cardiac catheterization    LHC (01/2001):  Normal Cors.  EF 65%.;  LexiScan Myoview (03/2013): No ischemia, EF 61%, normal  . Hx of echocardiogram    Echo (11/2011):  Mod LVH, EF 60-65%, no RWMA, MAC, mild LAE, PASP 34.  Marland Kitchen HYPERLIPIDEMIA   . Hypertension   . Hypothyroidism   . Morbid obesity ([place])    target wt=179lb for BMI<30 Peak wt 282lb  . Osteoporosis    last BMD 4/08 -2.4, intolerant  of bisphosphonates  . Peripheral vascular disease ([facility])    hx dvt.pe  . Pneumonia    hx  . PONV (postoperative nausea and vomiting)   . PREMATURE VENTRICULAR CONTRACTIONS   . PULMONARY EMBOLISM, HX OF 06/19/2007  . Rhabdomyolysis 07/29/2009  . Shortness of breath dyspnea   . Tremor   . VITAMIN D DEFICIENCY    Past Surgical History:  Procedure Laterality Date  . ABDOMINAL HYSTERECTOMY    . BREAST LUMPECTOMY WITH RADIOACTIVE SEED LOCALIZATION Right 06/05/2015   Procedure: RIGHT BREAST LUMPECTOMY WITH RADIOACTIVE SEED LOCALIZATION;  Surgeon: Fanny Skates, MD;  Location: [place];  Service: General;  Laterality: Right;  . BREAST SURGERY Left    s/p mass removal  . CARDIOVERSION N/A 12/11/2014   Procedure: CARDIOVERSION;  Surgeon: Josue Hector, MD;  Location: [place];  Service: Cardiovascular;  Laterality: N/A;  . CARDIOVERSION N/A 09/15/2016   Procedure: CARDIOVERSION;  Surgeon: Jerline Pain, MD;  Location: [facility] ENDOSCOPY;  Service: Cardiovascular;  Laterality: N/A;  . COLONOSCOPY  10/22/2003, ?2006  . COLONOSCOPY WITH PROPOFOL N/A 01/15/2016   Procedure: COLONOSCOPY WITH PROPOFOL;  Surgeon: Gatha Mayer, MD;  Location: [facility] ENDOSCOPY;  Service: Endoscopy;  Laterality: N/A;  . ESOPHAGOGASTRODUODENOSCOPY (EGD) WITH PROPOFOL N/A 01/15/2016   Procedure: ESOPHAGOGASTRODUODENOSCOPY (EGD) WITH PROPOFOL;  Surgeon:  Gatha Mayer, MD;  Location: [facility];  Service: Endoscopy;  Laterality: N/A;  . s/p left arm fracture with fall off chair    . skin graft to middle R finger  1975  . TEE WITHOUT CARDIOVERSION N/A 12/11/2014   Procedure: TRANSESOPHAGEAL ECHOCARDIOGRAM (TEE);  Surgeon: Josue Hector, MD;  Location: [facility] ENDOSCOPY;  Service: Cardiovascular;  Laterality: N/A;  . TONSILLECTOMY      reports that she quit smoking about 20 years ago. Her smoking use included Cigarettes. She has a 35.00 pack-year smoking history. She has never used smokeless tobacco. She reports that she does not drink  alcohol or use drugs. family history includes Asthma in her brother and son; Esophageal cancer in her brother; Heart disease in her mother; Liver disease in her mother. Allergies  Allergen Reactions  . Duloxetine Other (See Comments)    REACTION: rhabdomyolysis  . Penicillins Other (See Comments)    SYNCOPE Has patient had a PCN reaction causing immediate rash, facial/tongue/throat swelling, SOB or lightheadedness with hypotension: Yes Has patient had a PCN reaction causing severe rash involving mucus membranes or skin necrosis: No Has patient had a PCN reaction that required hospitalization No Has patient had a PCN reaction occurring within the last 10 years: No If all of the above answers are "NO", then may proceed with Cephalosporin use.   . Latex Rash   Current Outpatient Prescriptions on File Prior to Visit  Medication Sig Dispense Refill  . acetaminophen (TYLENOL) 650 MG CR tablet Per bottle as needed    . albuterol (PROAIR HFA) 108 (90 Base) MCG/ACT inhaler Inhale 2 puffs into the lungs every 6 (six) hours as needed for wheezing or shortness of breath. 3 Inhaler 1  . atorvastatin (LIPITOR) 10 MG tablet Take 1 tablet (10 mg total) by mouth daily. 90 tablet 2  . budesonide-formoterol (SYMBICORT) 160-4.5 MCG/ACT inhaler Inhale 2 puffs into the lungs 2 (two) times daily. 3 Inhaler 2  . Calcium Carbonate-Vitamin D 600-400 MG-UNIT per tablet Take 1 tablet by mouth 2 (two) times daily.      Marland Kitchen Dextromethorphan-Guaifenesin (MUCINEX DM MAXIMUM STRENGTH) 60-1200 MG TB12 Take 1 tablet by mouth every 12 (twelve) hours as needed (cough/congestion). With flutter    . diltiazem (CARDIZEM CD) 360 MG 24 hr capsule Take 1 capsule (360 mg total) by mouth daily. 90 capsule 3  . doxycycline (VIBRA-TABS) 100 MG tablet Take 1 tablet (100 mg total) by mouth 2 (two) times daily. 6 tablet 0  . ELIQUIS 5 MG TABS tablet TAKE 1 TABLET TWICE A DAY 180 tablet 0  . ferrous sulfate 325 (65 FE) MG tablet Take 325 mg  by mouth 2 (two) times daily with a meal.     . FLUoxetine (PROZAC) 40 MG capsule TAKE 1 CAPSULE DAILY 90 capsule 3  . fluticasone (FLONASE) 50 MCG/ACT nasal spray Place 2 sprays into both nostrils 2 (two) times daily as needed for allergies or rhinitis.    . furosemide (LASIX) 80 MG tablet Take 1 tablet (80 mg total) by mouth 2 (two) times daily.    Marland Kitchen glucosamine-chondroitin 500-400 MG tablet Take 1 tablet by mouth every morning.     Marland Kitchen KLOR-CON M20 20 MEQ tablet Take 1 tablet (20 mEq total) by mouth 2 (two) times daily. Please schedule appointment for refills. 180 tablet 0  . levalbuterol (XOPENEX) 1.25 MG/3ML nebulizer solution Take 1.25 mg by nebulization every 4 (four) hours as needed for wheezing or shortness of breath. 72 mL 0  .  levothyroxine (SYNTHROID, LEVOTHROID) 25 MCG tablet TAKE 1 TABLET DAILY BEFORE BREAKFAST 90 tablet 2  . Olopatadine HCl (PATADAY) 0.2 % SOLN Place 1 drop into both eyes daily.    Marland Kitchen omeprazole (PRILOSEC) 20 MG capsule Take 49ms before supper daily    . OXYGEN Inhale 2.5 L/min into the lungs continuous.    . predniSONE (DELTASONE) 10 MG tablet 4 tabs daily for 2 days, then 3 tabs daily for 2 days, 2 tabs daily for 2 days, then 1 tab daily for 2 days, then stop 20 tablet 0  . propafenone (RYTHMOL SR) 325 MG 12 hr capsule Take 1 capsule (325 mg total) by mouth 2 (two) times daily. 180 capsule 3  . traMADol (ULTRAM) 50 MG tablet Take 1-2 tablets (50-100 mg total) by mouth every 4 (four) hours as needed. 40 tablet 0   No current facility-administered medications on file prior to visit.    Review of Systems  Constitutional: Negative for other unusual diaphoresis or sweats HENT: Negative for ear discharge or swelling Eyes: Negative for other worsening visual disturbances Respiratory: Negative for stridor or other swelling  Gastrointestinal: Negative for worsening distension or other blood Genitourinary: Negative for retention or other urinary change Musculoskeletal:  Negative for other MSK pain or swelling Skin: Negative for color change or other new lesions Neurological: Negative for worsening tremors and other numbness  Psychiatric/Behavioral: Negative for worsening agitation or other fatigue All other system neg per pt    Objective:   Physical Exam BP 136/86   Pulse 85   Temp 98.5 F (36.9 C) (Oral)   Ht _0  (1.575 m)   Wt 260 lb (117.9 kg)   SpO2 92%   BMI 47.55 kg/m  VS noted, not ill appaering Constitutional: Pt appears in NAD HENT: Head: NCAT.  Right Ear: External ear normal.  Left Ear: External ear normal.  Eyes: . Pupils are equal, round, and reactive to light. Conjunctivae and EOM are normal Nose: without d/c or deformity Neck: Neck supple. Gross normal ROM Cardiovascular: Normal rate and regular rhythm.   Pulmonary/Chest: Effort normal and breath sounds decreased without rales or wheezing.  Neurological: Pt is alert. At baseline orientation, motor grossly intact Skin: Skin is warm. No rashes, other new lesions, no LE edema Psychiatric: Pt behavior is normal without agitation  No other exam findings  Culture, blood (Routine x 2)  Order: [id number] Status:  Preliminary result Visible to patient:  No (Not Released)  Component 3d ago  Specimen Description BLOOD RIGHT HAND   Special Requests BOTTLES DRAWN AEROBIC AND ANAEROBIC Blood Culture adequate volume   Culture NO GROWTH 3 DAYS   Report Status PENDING   Resulting Agency [facility]    Specimen Collected: 09/14/16 14:00 Last Resulted: 09/17/16 16:14                  Culture, blood (Routine x 2)  Order: [id number] Status:  Preliminary result Visible to patient:  No (Not Released)  Newer results are available. Click to view them now.  Component 3d ago  Specimen Description BLOOD RIGHT ANTECUBITAL   Special Requests BOTTLES DRAWN AEROBIC AND ANAEROBIC Blood Culture adequate volume   Culture NO GROWTH 3 DAYS   Report Status PENDING   Resulting Agency [id number]     Specimen Collected: 09/14/16 13:45 Last Resulted: 09/17/16 16:14             Assessment & Plan:

## 2016-09-17 NOTE — Patient Instructions (Signed)
Please continue all other medications as before, and refills have been done if requested.  Please have the pharmacy call with any other refills you may need.  Please continue your efforts at being more active, low cholesterol diet, and weight control.  You are otherwise up to date with prevention measures today.  Please keep your appointments with your specialists as you may have planned  We will ask if the Cardiology Appt can be moved up to 2-3 wks from now  Please go to the LAB in the Basement (turn left off the elevator) for the tests to be done today  You will be contacted by phone if any changes need to be made immediately.  Otherwise, you will receive a letter about your results with an explanation, but please check with MyChart first.  Please remember to sign up for MyChart if you have not done so, as this will be important to you in the future with finding out test results, communicating by private email, and scheduling acute appointments online when needed.

## 2016-09-17 NOTE — Telephone Encounter (Signed)
Called pt to verify the hosp f/u appt that was scheduled by the hospital for today to make sure she was aware of the appt whuch pt statwes she is aware. Inform her also need to ask some additional questions concerning hospital stay. Completed TCM call below.Johny Chess  Transition Care Management Follow-up Telephone Call   Date discharged? 09/16/16   How have you been since you were released from the hospital? Pt states she is feeling alright just a little tired   Do you understand why you were in the hospital? YES   Do you understand the discharge instructions? YES   Where were you discharged to? Home   Items Reviewed:  Medications reviewed: YES  Allergies reviewed: YES  Dietary changes reviewed: YES, heart healthy  Referrals reviewed: YES, both cardiology and pulmonology appt are set up sometime in Oct   Functional Questionnaire:   Activities of Daily Living (ADLs):   She states she are independent in the following: bathing and hygiene, feeding, continence, grooming, toileting and dressing States she require assistance with the following: ambulation   Any transportation issues/concerns?: NO   Any patient concerns? NO   Confirmed importance and date/time of follow-up visits scheduled YES, appt 09/17/16  Provider Appointment booked with Dr. Jenny Reichmann   Confirmed with patient if condition begins to worsen call PCP or go to the ER.  Patient was given the office number and encouraged to call back with question or concerns.  : YES

## 2016-09-18 ENCOUNTER — Other Ambulatory Visit: Payer: Self-pay | Admitting: Internal Medicine

## 2016-09-18 NOTE — Assessment & Plan Note (Signed)
stable overall by history and exam, and pt to continue medical treatment as before,  to f/u any worsening symptoms or concerns 

## 2016-09-18 NOTE — Assessment & Plan Note (Signed)
Improved, to finish steroid taper,  to f/u any worsening symptoms or concerns

## 2016-09-18 NOTE — Assessment & Plan Note (Signed)
Now s/p cardioversion and has regular rhythm by exam today, declines repeat ECG, to f/u with cardiology as planned, for f/u cbc and bmp as recommended at d/c

## 2016-09-19 LAB — CULTURE, BLOOD (ROUTINE X 2)
Culture: NO GROWTH
Culture: NO GROWTH
Special Requests: ADEQUATE
Special Requests: ADEQUATE

## 2016-09-20 ENCOUNTER — Telehealth: Payer: Self-pay

## 2016-09-20 ENCOUNTER — Telehealth: Payer: Self-pay | Admitting: Internal Medicine

## 2016-09-20 NOTE — Telephone Encounter (Signed)
-----   Message from Biagio Borg, MD sent at 09/18/2016  5:28 PM EDT ----- Correction, pt is already taking kdur 20 meq bid;  Ok to take extra 20 meq per day for 3 days

## 2016-09-20 NOTE — Telephone Encounter (Signed)
Pt has been informed and expressed understanding.  

## 2016-09-20 NOTE — Telephone Encounter (Signed)
New Message     Pt is trying to get ahold of [facility] to get medication for her nebulizer and there is no answer please call

## 2016-09-20 NOTE — Telephone Encounter (Signed)
Returned call to patient. She states she was recently discharged from hospital and her neb med was changed. She decided it was time to get a new nebulizer so she went to [facility] and got a new one but she does not think her new machine is working properly. Advised that she should take her machine to [facility] to have them troubleshoot what may be malfunctioning. Explained that she may not be able to get thru via phone quickly as incelment weather is impending and there may be many people trying to call in. Advised that she try to coordinate this early in the week so she has what she needs before end of the week. Advised that neb refills will come from Dr. Melvyn Novas

## 2016-09-21 ENCOUNTER — Telehealth: Payer: Self-pay | Admitting: Internal Medicine

## 2016-09-21 DIAGNOSIS — J449 Chronic obstructive pulmonary disease, unspecified: Secondary | ICD-10-CM

## 2016-09-21 NOTE — Telephone Encounter (Signed)
Called pt and advised her that I would send an order to [facility] for the nebulizer. Pt has an upcoming appt. Pt understtod and nothing further is needed. Order placed.

## 2016-09-21 NOTE — Telephone Encounter (Signed)
Spoke with patient. She stated that she got a new neb machine from [facility] after being discharged from the hospital on 9/7. Because she did not have a rx, she had to pay out of pocket. Now that she has the machine, it is not working. She was told that if she had a RX, she would not have to pay. She wants to know if you are willing to write a RX for a new nebulizer machine.    MW, please advise. Thanks!

## 2016-09-21 NOTE — Telephone Encounter (Signed)
Patient is returning phone call. Patient stated she is at home.

## 2016-09-21 NOTE — Telephone Encounter (Signed)
lmtcb x1 for pt. 

## 2016-09-21 NOTE — Telephone Encounter (Signed)
Fine with me but needs to set up ov with me or [name] asap to regroup with all meds and med calendar in hand

## 2016-09-22 ENCOUNTER — Telehealth: Payer: Self-pay | Admitting: Internal Medicine

## 2016-09-22 NOTE — Telephone Encounter (Signed)
Left message for Susan

## 2016-09-23 NOTE — Telephone Encounter (Signed)
lmomtcb x 2 for [name] [name] with [employer].

## 2016-09-23 NOTE — Telephone Encounter (Signed)
[name] with [place] returning call, [phone] ext [phone]

## 2016-09-23 NOTE — Telephone Encounter (Signed)
lmtcb for [name] with [employer].

## 2016-09-27 ENCOUNTER — Telehealth: Payer: Self-pay | Admitting: Internal Medicine

## 2016-09-27 NOTE — Telephone Encounter (Signed)
lmomtcb x 2 for [name] [name]

## 2016-09-27 NOTE — Telephone Encounter (Signed)
[place] x 1  - Does pt want [place] to send [facility] also HFA inhaler to [facility]

## 2016-09-27 NOTE — Telephone Encounter (Signed)
Pt returned call back.  -tr

## 2016-09-27 NOTE — Telephone Encounter (Signed)
Pt states that she was changed from Albuterol to Xopenex neb while in hospital due to Atrial Fib.  She was told not to use the Albuterol rescue inhaler either.  She is requesting refills for Xopenex neb and also a rescue inhaler if Dr Melvyn Novas thinks she needs it.  Please advise

## 2016-09-27 NOTE — Telephone Encounter (Signed)
Fine with me for the xopenex neb and hfa to use up to q 4 h prn

## 2016-09-28 MED ORDER — ALBUTEROL SULFATE HFA 108 (90 BASE) MCG/ACT IN AERS: 2.0000 | INHALATION_SPRAY | Freq: Four times a day (QID) | RESPIRATORY_TRACT | 3 refills | Status: DC | PRN

## 2016-09-28 MED ORDER — LEVALBUTEROL HCL 1.25 MG/3ML IN NEBU: 1.2500 mg | INHALATION_SOLUTION | RESPIRATORY_TRACT | 11 refills | Status: AC

## 2016-09-28 NOTE — Telephone Encounter (Signed)
LMTCB for [name] [name] with [employer].

## 2016-09-28 NOTE — Telephone Encounter (Signed)
Called and spoke with [name] [name]. [name] [name] states during pt's admission for A-FIB on 09/14/16. pt was switched from albuterol to Xopenex HFA. Pt will need Xopenex refills, as she is almost out of medication. Pt will also need Rx for Xopenex neb solution. Per [name] [name] pt is also requesting that humidifier be added to her O2.  MW please advise. Thanks.

## 2016-09-28 NOTE — Telephone Encounter (Signed)
[name] [name] with [employer] is returning phone

## 2016-09-28 NOTE — Telephone Encounter (Signed)
[name] [name] with [employer] is returning phone call.

## 2016-09-28 NOTE — Telephone Encounter (Signed)
lmomtcb x 3 [name] [name]

## 2016-09-28 NOTE — Telephone Encounter (Signed)
Spoke with [name], she states she was just calling on pt;s behalf to get refills. I called pt to verify pharmacy, pt states she [facility] and nothing further is needed.

## 2016-09-28 NOTE — Telephone Encounter (Signed)
Pended Rx's and called [name] and left voicemail to call us back.

## 2016-09-28 NOTE — Telephone Encounter (Signed)
All requests are fine with me

## 2016-09-28 NOTE — Telephone Encounter (Signed)
Called and spoke with pt and she wanted to have these medications sent to the mail order pharmacy.  This has been done and nothing further is needed.

## 2016-10-06 ENCOUNTER — Inpatient Hospital Stay (HOSPITAL_COMMUNITY)
Admission: EM | Admit: 2016-10-06 | Discharge: 2016-10-09 | DRG: 274 | Disposition: A | Discharge status: Home / Self Care | Payer: BLUE CROSS/BLUE SHIELD | Attending: Internal Medicine | Admitting: Internal Medicine

## 2016-10-06 ENCOUNTER — Telehealth: Payer: Self-pay | Admitting: Cardiology

## 2016-10-06 ENCOUNTER — Emergency Department (HOSPITAL_COMMUNITY): Payer: BLUE CROSS/BLUE SHIELD

## 2016-10-06 ENCOUNTER — Encounter (HOSPITAL_COMMUNITY): Payer: Self-pay | Admitting: Emergency Medicine

## 2016-10-06 DIAGNOSIS — I5032 Chronic diastolic (congestive) heart failure: Secondary | ICD-10-CM | POA: Diagnosis present

## 2016-10-06 DIAGNOSIS — J9612 Chronic respiratory failure with hypercapnia: Secondary | ICD-10-CM | POA: Diagnosis not present

## 2016-10-06 DIAGNOSIS — Z880 Allergy status to penicillin: Secondary | ICD-10-CM

## 2016-10-06 DIAGNOSIS — Z6841 Body Mass Index (BMI) 40.0 and over, adult: Secondary | ICD-10-CM

## 2016-10-06 DIAGNOSIS — G251 Drug-induced tremor: Secondary | ICD-10-CM | POA: Diagnosis present

## 2016-10-06 DIAGNOSIS — I454 Nonspecific intraventricular block: Secondary | ICD-10-CM | POA: Diagnosis present

## 2016-10-06 DIAGNOSIS — E039 Hypothyroidism, unspecified: Secondary | ICD-10-CM | POA: Diagnosis present

## 2016-10-06 DIAGNOSIS — Z23 Encounter for immunization: Secondary | ICD-10-CM

## 2016-10-06 DIAGNOSIS — J9611 Chronic respiratory failure with hypoxia: Secondary | ICD-10-CM | POA: Diagnosis present

## 2016-10-06 DIAGNOSIS — I4819 Other persistent atrial fibrillation: Secondary | ICD-10-CM

## 2016-10-06 DIAGNOSIS — R05 Cough: Secondary | ICD-10-CM | POA: Diagnosis not present

## 2016-10-06 DIAGNOSIS — G4730 Sleep apnea, unspecified: Secondary | ICD-10-CM | POA: Diagnosis present

## 2016-10-06 DIAGNOSIS — H538 Other visual disturbances: Secondary | ICD-10-CM | POA: Diagnosis not present

## 2016-10-06 DIAGNOSIS — I10 Essential (primary) hypertension: Secondary | ICD-10-CM | POA: Diagnosis not present

## 2016-10-06 DIAGNOSIS — Z79890 Hormone replacement therapy (postmenopausal): Secondary | ICD-10-CM

## 2016-10-06 DIAGNOSIS — K219 Gastro-esophageal reflux disease without esophagitis: Secondary | ICD-10-CM | POA: Diagnosis present

## 2016-10-06 DIAGNOSIS — F329 Major depressive disorder, single episode, unspecified: Secondary | ICD-10-CM | POA: Diagnosis present

## 2016-10-06 DIAGNOSIS — Z8781 Personal history of (healed) traumatic fracture: Secondary | ICD-10-CM

## 2016-10-06 DIAGNOSIS — Z7901 Long term (current) use of anticoagulants: Secondary | ICD-10-CM | POA: Diagnosis not present

## 2016-10-06 DIAGNOSIS — F32A Depression, unspecified: Secondary | ICD-10-CM | POA: Diagnosis present

## 2016-10-06 DIAGNOSIS — I480 Paroxysmal atrial fibrillation: Secondary | ICD-10-CM | POA: Diagnosis not present

## 2016-10-06 DIAGNOSIS — J441 Chronic obstructive pulmonary disease with (acute) exacerbation: Secondary | ICD-10-CM | POA: Diagnosis present

## 2016-10-06 DIAGNOSIS — I481 Persistent atrial fibrillation: Secondary | ICD-10-CM | POA: Diagnosis present

## 2016-10-06 DIAGNOSIS — Z86718 Personal history of other venous thrombosis and embolism: Secondary | ICD-10-CM

## 2016-10-06 DIAGNOSIS — Z7951 Long term (current) use of inhaled steroids: Secondary | ICD-10-CM

## 2016-10-06 DIAGNOSIS — Z800 Family history of malignant neoplasm of digestive organs: Secondary | ICD-10-CM

## 2016-10-06 DIAGNOSIS — Z825 Family history of asthma and other chronic lower respiratory diseases: Secondary | ICD-10-CM

## 2016-10-06 DIAGNOSIS — I4891 Unspecified atrial fibrillation: Secondary | ICD-10-CM | POA: Diagnosis not present

## 2016-10-06 DIAGNOSIS — Y92230 Patient room in hospital as the place of occurrence of the external cause: Secondary | ICD-10-CM | POA: Diagnosis not present

## 2016-10-06 DIAGNOSIS — Z91040 Latex allergy status: Secondary | ICD-10-CM

## 2016-10-06 DIAGNOSIS — Z86711 Personal history of pulmonary embolism: Secondary | ICD-10-CM

## 2016-10-06 DIAGNOSIS — I483 Typical atrial flutter: Secondary | ICD-10-CM | POA: Diagnosis not present

## 2016-10-06 DIAGNOSIS — I4892 Unspecified atrial flutter: Secondary | ICD-10-CM | POA: Diagnosis present

## 2016-10-06 DIAGNOSIS — Z9981 Dependence on supplemental oxygen: Secondary | ICD-10-CM

## 2016-10-06 DIAGNOSIS — E876 Hypokalemia: Secondary | ICD-10-CM

## 2016-10-06 DIAGNOSIS — Z888 Allergy status to other drugs, medicaments and biological substances status: Secondary | ICD-10-CM

## 2016-10-06 DIAGNOSIS — F411 Generalized anxiety disorder: Secondary | ICD-10-CM | POA: Diagnosis present

## 2016-10-06 DIAGNOSIS — Z8672 Personal history of thrombophlebitis: Secondary | ICD-10-CM

## 2016-10-06 DIAGNOSIS — E559 Vitamin D deficiency, unspecified: Secondary | ICD-10-CM | POA: Diagnosis present

## 2016-10-06 DIAGNOSIS — E785 Hyperlipidemia, unspecified: Secondary | ICD-10-CM | POA: Diagnosis present

## 2016-10-06 DIAGNOSIS — R059 Cough, unspecified: Secondary | ICD-10-CM

## 2016-10-06 DIAGNOSIS — T50905A Adverse effect of unspecified drugs, medicaments and biological substances, initial encounter: Secondary | ICD-10-CM | POA: Diagnosis not present

## 2016-10-06 DIAGNOSIS — Z8249 Family history of ischemic heart disease and other diseases of the circulatory system: Secondary | ICD-10-CM

## 2016-10-06 DIAGNOSIS — Z0001 Encounter for general adult medical examination with abnormal findings: Secondary | ICD-10-CM

## 2016-10-06 DIAGNOSIS — I110 Hypertensive heart disease with heart failure: Secondary | ICD-10-CM | POA: Diagnosis present

## 2016-10-06 DIAGNOSIS — Z87891 Personal history of nicotine dependence: Secondary | ICD-10-CM

## 2016-10-06 DIAGNOSIS — Z90710 Acquired absence of both cervix and uterus: Secondary | ICD-10-CM

## 2016-10-06 DIAGNOSIS — H269 Unspecified cataract: Secondary | ICD-10-CM

## 2016-10-06 DIAGNOSIS — M810 Age-related osteoporosis without current pathological fracture: Secondary | ICD-10-CM | POA: Diagnosis present

## 2016-10-06 DIAGNOSIS — Z79891 Long term (current) use of opiate analgesic: Secondary | ICD-10-CM

## 2016-10-06 DIAGNOSIS — Z79899 Other long term (current) drug therapy: Secondary | ICD-10-CM

## 2016-10-06 DIAGNOSIS — Z853 Personal history of malignant neoplasm of breast: Secondary | ICD-10-CM

## 2016-10-06 LAB — CBC
HCT: 37.5 % (ref 36.0–46.0)
Hemoglobin: 12.3 g/dL (ref 12.0–15.0)
MCH: 29.9 pg (ref 26.0–34.0)
MCHC: 32.8 g/dL (ref 30.0–36.0)
MCV: 91.2 fL (ref 78.0–100.0)
Platelets: 244 10*3/uL (ref 150–400)
RBC: 4.11 MIL/uL (ref 3.87–5.11)
RDW: 15.6 % — ABNORMAL HIGH (ref 11.5–15.5)
WBC: 9.5 10*3/uL (ref 4.0–10.5)

## 2016-10-06 LAB — BASIC METABOLIC PANEL
Anion gap: 8 (ref 5–15)
BUN: 11 mg/dL (ref 6–20)
CO2: 32 mmol/L (ref 22–32)
Calcium: 8.9 mg/dL (ref 8.9–10.3)
Chloride: 101 mmol/L (ref 101–111)
Creatinine, Ser: 0.85 mg/dL (ref 0.44–1.00)
GFR calc Af Amer: >60 mL/min (ref >60)
GFR calc non Af Amer: >60 mL/min (ref >60)
Glucose, Bld: 128 mg/dL — ABNORMAL HIGH (ref 65–99)
Potassium: 3.7 mmol/L (ref 3.5–5.1)
Sodium: 141 mmol/L (ref 135–145)

## 2016-10-06 LAB — TROPONIN I
Troponin I: <0.03 ng/mL (ref <0.03)
Troponin I: <0.03 ng/mL (ref <0.03)
Troponin I: <0.03 ng/mL (ref <0.03)

## 2016-10-06 MED ORDER — DILTIAZEM HCL 100 MG IV SOLR
5.0000 mg/h | INTRAVENOUS | Status: DC
  Administered 2016-10-06: 5 mg/h via INTRAVENOUS
  Administered 2016-10-07: 15 mg/h via INTRAVENOUS
  Administered 2016-10-07 (×2): 10 mg/h via INTRAVENOUS
  Filled 2016-10-06 (×6): qty 100

## 2016-10-06 MED ORDER — TRAMADOL HCL 50 MG PO TABS: 50.0000 mg | ORAL_TABLET | Freq: Four times a day (QID) | ORAL | Status: DC | PRN

## 2016-10-06 MED ORDER — FLUOXETINE HCL 20 MG PO CAPS
40.0000 mg | ORAL_CAPSULE | Freq: Every day | ORAL | Status: DC
  Administered 2016-10-07 – 2016-10-09 (×3): 40 mg via ORAL
  Filled 2016-10-06 (×3): qty 2

## 2016-10-06 MED ORDER — METOPROLOL TARTRATE 5 MG/5ML IV SOLN
5.0000 mg | INTRAVENOUS | Status: AC | PRN
  Administered 2016-10-06 (×2): 5 mg via INTRAVENOUS
  Filled 2016-10-06 (×2): qty 5

## 2016-10-06 MED ORDER — FERROUS SULFATE 325 (65 FE) MG PO TABS
325.0000 mg | ORAL_TABLET | Freq: Two times a day (BID) | ORAL | Status: DC
  Administered 2016-10-06 – 2016-10-09 (×6): 325 mg via ORAL
  Filled 2016-10-06 (×6): qty 1

## 2016-10-06 MED ORDER — DILTIAZEM LOAD VIA INFUSION
10.0000 mg | Freq: Once | INTRAVENOUS | Status: AC
  Administered 2016-10-06: 10 mg via INTRAVENOUS
  Filled 2016-10-06: qty 10

## 2016-10-06 MED ORDER — MOMETASONE FURO-FORMOTEROL FUM 200-5 MCG/ACT IN AERO
2.0000 | INHALATION_SPRAY | Freq: Two times a day (BID) | RESPIRATORY_TRACT | Status: DC
  Administered 2016-10-07 – 2016-10-09 (×5): 2 via RESPIRATORY_TRACT
  Filled 2016-10-06: qty 8.8

## 2016-10-06 MED ORDER — SENNOSIDES-DOCUSATE SODIUM 8.6-50 MG PO TABS: 1.0000 | ORAL_TABLET | Freq: Every evening | ORAL | Status: DC | PRN

## 2016-10-06 MED ORDER — ALBUTEROL SULFATE (2.5 MG/3ML) 0.083% IN NEBU: 2.5000 mg | INHALATION_SOLUTION | Freq: Four times a day (QID) | RESPIRATORY_TRACT | Status: DC | PRN

## 2016-10-06 MED ORDER — ONDANSETRON HCL 4 MG PO TABS: 4.0000 mg | ORAL_TABLET | Freq: Four times a day (QID) | ORAL | Status: DC | PRN

## 2016-10-06 MED ORDER — ACETAMINOPHEN 325 MG PO TABS
650.0000 mg | ORAL_TABLET | Freq: Four times a day (QID) | ORAL | Status: DC | PRN
  Administered 2016-10-08: 650 mg via ORAL
  Filled 2016-10-06: qty 2

## 2016-10-06 MED ORDER — LEVALBUTEROL HCL 1.25 MG/0.5ML IN NEBU
1.2500 mg | INHALATION_SOLUTION | RESPIRATORY_TRACT | Status: DC
  Administered 2016-10-06: 1.25 mg via RESPIRATORY_TRACT
  Filled 2016-10-06: qty 0.5

## 2016-10-06 MED ORDER — METOPROLOL TARTRATE 12.5 MG HALF TABLET
12.5000 mg | ORAL_TABLET | Freq: Four times a day (QID) | ORAL | Status: DC
  Administered 2016-10-06 – 2016-10-09 (×11): 12.5 mg via ORAL
  Filled 2016-10-06 (×12): qty 1

## 2016-10-06 MED ORDER — ACETAMINOPHEN 650 MG RE SUPP: 650.0000 mg | Freq: Four times a day (QID) | RECTAL | Status: DC | PRN

## 2016-10-06 MED ORDER — CALCIUM CARBONATE-VITAMIN D 500-200 MG-UNIT PO TABS
1.0000 | ORAL_TABLET | Freq: Two times a day (BID) | ORAL | Status: DC
  Administered 2016-10-07 – 2016-10-09 (×5): 1 via ORAL
  Filled 2016-10-06 (×6): qty 1

## 2016-10-06 MED ORDER — DM-GUAIFENESIN ER 30-600 MG PO TB12: 1.0000 | ORAL_TABLET | Freq: Two times a day (BID) | ORAL | Status: DC | PRN

## 2016-10-06 MED ORDER — ATORVASTATIN CALCIUM 10 MG PO TABS
10.0000 mg | ORAL_TABLET | Freq: Every day | ORAL | Status: DC
  Administered 2016-10-07 – 2016-10-09 (×3): 10 mg via ORAL
  Filled 2016-10-06 (×3): qty 1

## 2016-10-06 MED ORDER — APIXABAN 5 MG PO TABS: 5.0000 mg | ORAL_TABLET | Freq: Two times a day (BID) | ORAL | Status: DC

## 2016-10-06 MED ORDER — BISACODYL 10 MG RE SUPP: 10.0000 mg | Freq: Every day | RECTAL | Status: DC | PRN

## 2016-10-06 MED ORDER — OLOPATADINE HCL 0.1 % OP SOLN
1.0000 [drp] | Freq: Two times a day (BID) | OPHTHALMIC | Status: DC
  Administered 2016-10-06 – 2016-10-09 (×6): 1 [drp] via OPHTHALMIC
  Filled 2016-10-06: qty 5

## 2016-10-06 MED ORDER — LEVOTHYROXINE SODIUM 25 MCG PO TABS
25.0000 ug | ORAL_TABLET | Freq: Every day | ORAL | Status: DC
  Administered 2016-10-07 – 2016-10-09 (×3): 25 ug via ORAL
  Filled 2016-10-06 (×3): qty 1

## 2016-10-06 MED ORDER — METOPROLOL TARTRATE 5 MG/5ML IV SOLN
5.0000 mg | INTRAVENOUS | Status: DC | PRN
  Administered 2016-10-08: 5 mg via INTRAVENOUS
  Filled 2016-10-06: qty 5

## 2016-10-06 MED ORDER — FUROSEMIDE 80 MG PO TABS
80.0000 mg | ORAL_TABLET | Freq: Two times a day (BID) | ORAL | Status: DC
  Administered 2016-10-06 – 2016-10-09 (×6): 80 mg via ORAL
  Filled 2016-10-06: qty 1
  Filled 2016-10-06 (×2): qty 4
  Filled 2016-10-06: qty 1
  Filled 2016-10-06 (×2): qty 4

## 2016-10-06 MED ORDER — ONDANSETRON HCL 4 MG/2ML IJ SOLN: 4.0000 mg | Freq: Four times a day (QID) | INTRAMUSCULAR | Status: DC | PRN

## 2016-10-06 MED ORDER — GLUCOSAMINE-CHONDROITIN 500-400 MG PO TABS: 1.0000 | ORAL_TABLET | Freq: Every morning | ORAL | Status: DC

## 2016-10-06 MED ORDER — APIXABAN 5 MG PO TABS
5.0000 mg | ORAL_TABLET | Freq: Two times a day (BID) | ORAL | Status: DC
  Administered 2016-10-06 – 2016-10-09 (×6): 5 mg via ORAL
  Filled 2016-10-06 (×6): qty 1

## 2016-10-06 MED ORDER — HYDROCODONE-ACETAMINOPHEN 5-325 MG PO TABS: 1.0000 | ORAL_TABLET | ORAL | Status: DC | PRN

## 2016-10-06 MED ORDER — DILTIAZEM HCL ER COATED BEADS 360 MG PO CP24
360.0000 mg | ORAL_CAPSULE | Freq: Every day | ORAL | Status: DC
  Administered 2016-10-07 – 2016-10-09 (×3): 360 mg via ORAL
  Filled 2016-10-06 (×3): qty 1

## 2016-10-06 MED ORDER — PANTOPRAZOLE SODIUM 40 MG PO TBEC
40.0000 mg | DELAYED_RELEASE_TABLET | Freq: Every day | ORAL | Status: DC
  Administered 2016-10-07 – 2016-10-09 (×3): 40 mg via ORAL
  Filled 2016-10-06 (×3): qty 1

## 2016-10-06 MED ORDER — POTASSIUM CHLORIDE CRYS ER 20 MEQ PO TBCR
20.0000 meq | EXTENDED_RELEASE_TABLET | Freq: Two times a day (BID) | ORAL | Status: DC
  Administered 2016-10-06 – 2016-10-09 (×6): 20 meq via ORAL
  Filled 2016-10-06 (×6): qty 1

## 2016-10-06 MED ORDER — SODIUM CHLORIDE 0.9 % IV BOLUS (SEPSIS)
500.0000 mL | Freq: Once | INTRAVENOUS | Status: AC
  Administered 2016-10-06: 500 mL via INTRAVENOUS

## 2016-10-06 MED ORDER — PROPAFENONE HCL ER 325 MG PO CP12
325.0000 mg | ORAL_CAPSULE | Freq: Two times a day (BID) | ORAL | Status: DC
Start: 2016-10-06 — End: 2016-10-09
  Administered 2016-10-06 – 2016-10-09 (×6): 325 mg via ORAL
  Filled 2016-10-06 (×6): qty 1

## 2016-10-06 NOTE — Consult Note (Signed)
CARDIOLOGY CONSULT NOTE  Patient ID: [name] [name] MRN: [id number] DOB/AGE: 03/22/1950 66 y.o.  Admit date: 10/06/2016 Primary Physician Biagio Borg, MD Primary Cardiologist Dr. Stanford Breed Chief Complaint  Atrial fib with rapid rate Requesting  Dr. Marily Memos  HPI:  The patient has a history of atrial fib/flutter paroxysmal with RVR.  She has had multiple DCCV.   She had ablation by Dr. Lovena Le in 2010.  I do not see that she has seen EP in many years.   She has COPD and uses O2 at home.   She was in the hospital earlier this month with atrial flutter.  We saw her in consultation at that time as well.   She had DCCV.   She has been treated with propafenone and we have been avoiding amiodarone secondary to her chronic lung disease.  She has been treated with Eliquis.    She does not notice that she is in atrial flutter.  She was feeling flushed this morning.  She does this periodically.  Her husband took her BP and it was 163/104 with a HR of 147.  She came to the ED where she was found to be in flutter.  In the ED she was treated with a Cardizem drip.  Rate is still increased.  The patient denies any new symptoms such as chest discomfort, neck or arm discomfort. There has been no new shortness of breath, PND or orthopnea. There have been no reported palpitations, presyncope or syncope.  She always wears her O2 and this has been unchanged.  She has had a chronic non productive cough.  She denies any fevers or chills.  She has no weight gain or edema.     Past Medical History:  Diagnosis Date  . Anemia    hx  . Anxiety   . Arthritis   . Asthma   . Atrial fibrillation and flutter ([place])   . Atypical lobular hyperplasia of right breast 05/09/2015  . Breast CA ([place])    ?  Marland Kitchen Chronic rhinitis   . COPD (chronic obstructive pulmonary disease) ([place])    [place]. PFTs 07/25/06 FEV1 55% ratio 53, DLC0 51% HFA 50% 12/16/2009  . Depression   . FRACTURE, RIB, RIGHT 06/18/2009  . Glucose intolerance (impaired  glucose tolerance)    on steroids  . History of DVT (deep vein thrombosis)   . Hx of cardiac catheterization    LHC (01/2001):  Normal Cors.  EF 65%.;  LexiScan Myoview (03/2013): No ischemia, EF 61%, normal  . Hx of echocardiogram    Echo (11/2011):  Mod LVH, EF 60-65%, no RWMA, MAC, mild LAE, PASP 34.  Marland Kitchen HYPERLIPIDEMIA   . Hypertension   . Hypothyroidism   . Morbid obesity ([place])    target wt=179lb for BMI<30 Peak wt 282lb  . Osteoporosis    last BMD 4/08 -2.4, intolerant of bisphosphonates  . Peripheral vascular disease ([facility])    hx dvt.pe  . Pneumonia    hx  . PONV (postoperative nausea and vomiting)   . PREMATURE VENTRICULAR CONTRACTIONS   . PULMONARY EMBOLISM, HX OF 06/19/2007  . Rhabdomyolysis 07/29/2009  . Tremor   . VITAMIN D DEFICIENCY     Past Surgical History:  Procedure Laterality Date  . ABDOMINAL HYSTERECTOMY    . BREAST LUMPECTOMY WITH RADIOACTIVE SEED LOCALIZATION Right 06/05/2015   Procedure: RIGHT BREAST LUMPECTOMY WITH RADIOACTIVE SEED LOCALIZATION;  Surgeon: Fanny Skates, MD;  Location: [place];  Service: General;  Laterality: Right;  .  BREAST SURGERY Left    s/p mass removal  . CARDIOVERSION N/A 12/11/2014   Procedure: CARDIOVERSION;  Surgeon: Josue Hector, MD;  Location: [place];  Service: Cardiovascular;  Laterality: N/A;  . CARDIOVERSION N/A 09/15/2016   Procedure: CARDIOVERSION;  Surgeon: Jerline Pain, MD;  Location: [facility] ENDOSCOPY;  Service: Cardiovascular;  Laterality: N/A;  . COLONOSCOPY  10/22/2003, ?2006  . COLONOSCOPY WITH PROPOFOL N/A 01/15/2016   Procedure: COLONOSCOPY WITH PROPOFOL;  Surgeon: Gatha Mayer, MD;  Location: [facility] ENDOSCOPY;  Service: Endoscopy;  Laterality: N/A;  . ESOPHAGOGASTRODUODENOSCOPY (EGD) WITH PROPOFOL N/A 01/15/2016   Procedure: ESOPHAGOGASTRODUODENOSCOPY (EGD) WITH PROPOFOL;  Surgeon: Gatha Mayer, MD;  Location: [facility] ENDOSCOPY;  Service: Endoscopy;  Laterality: N/A;  . s/p left arm fracture with fall off chair    . skin  graft to middle R finger  1975  . TEE WITHOUT CARDIOVERSION N/A 12/11/2014   Procedure: TRANSESOPHAGEAL ECHOCARDIOGRAM (TEE);  Surgeon: Josue Hector, MD;  Location: [facility] ENDOSCOPY;  Service: Cardiovascular;  Laterality: N/A;  . TONSILLECTOMY      Allergies  Allergen Reactions  . Duloxetine Other (See Comments)    REACTION: rhabdomyolysis  . Penicillins Other (See Comments)    SYNCOPE Has patient had a PCN reaction causing immediate rash, facial/tongue/throat swelling, SOB or lightheadedness with hypotension: Yes Has patient had a PCN reaction causing severe rash involving mucus membranes or skin necrosis: No Has patient had a PCN reaction that required hospitalization No Has patient had a PCN reaction occurring within the last 10 years: No If all of the above answers are "NO", then may proceed with Cephalosporin use.   . Latex Rash   Prior to Admission medications   Medication Sig Start Date End Date Taking? Authorizing Provider  acetaminophen (TYLENOL) 650 MG CR tablet Take 650-1,300 mg by mouth as needed for pain. Per bottle as needed   Yes [provider]  albuterol (PROAIR HFA) 108 (90 Base) MCG/ACT inhaler Inhale 2 puffs into the lungs every 6 (six) hours as needed for wheezing or shortness of breath. 09/28/16  Yes Tanda Rockers, MD  atorvastatin (LIPITOR) 10 MG tablet Take 1 tablet (10 mg total) by mouth daily. 07/05/16  Yes Biagio Borg, MD  budesonide-formoterol [facility]) 160-4.5 MCG/ACT inhaler Inhale 2 puffs into the lungs 2 (two) times daily. 12/12/15  Yes Parrett, [name] S, NP  Calcium Carbonate-Vitamin D 600-400 MG-UNIT per tablet Take 1 tablet by mouth 2 (two) times daily.     Yes [provider]  Dextromethorphan-Guaifenesin (MUCINEX DM MAXIMUM STRENGTH) 60-1200 MG TB12 Take 1 tablet by mouth every 12 (twelve) hours as needed (cough/congestion). With flutter   Yes [provider]  diltiazem (CARDIZEM CD) 360 MG 24 hr capsule Take 1 capsule (360 mg  total) by mouth daily. 10/16/15  Yes Lelon Perla, MD  ELIQUIS 5 MG TABS tablet TAKE 1 TABLET TWICE A DAY 07/19/16  Yes Lelon Perla, MD  ferrous sulfate 325 (65 FE) MG tablet Take 325 mg by mouth 2 (two) times daily with a meal.    Yes [provider]  FLUoxetine (PROZAC) 40 MG capsule TAKE 1 CAPSULE DAILY 06/22/16  Yes Biagio Borg, MD  fluticasone [facility]) 50 MCG/ACT nasal spray Place 2 sprays into both nostrils 2 (two) times daily as needed for allergies or rhinitis.   Yes [provider]  furosemide (LASIX) 80 MG tablet Take 1 tablet (80 mg total) by mouth 2 (two) times daily. 09/16/16  Yes  Modena Jansky, MD  glucosamine-chondroitin 500-400 MG tablet Take 1 tablet by mouth every morning.    Yes [provider]  KLOR-CON M20 20 MEQ tablet Take 1 tablet (20 mEq total) by mouth 2 (two) times daily. Please schedule appointment for refills. 08/04/16  Yes Lelon Perla, MD  levalbuterol Penne Lash) 1.25 MG/3ML nebulizer solution Take 1.25 mg by nebulization every 4 (four) hours. 09/28/16  Yes Tanda Rockers, MD  levothyroxine (SYNTHROID, LEVOTHROID) 25 MCG tablet TAKE 1 TABLET DAILY BEFORE BREAKFAST 01/22/16  Yes Biagio Borg, MD  Olopatadine HCl (PATADAY) 0.2 % SOLN Place 1 drop into both eyes daily.   Yes [provider]  omeprazole (PRILOSEC) 20 MG capsule Take 43ms before supper daily 09/16/16  Yes Hongalgi, ALenis Dickinson MD  OXYGEN Inhale 2.5 L/min into the lungs continuous.   Yes [provider]  propafenone (RYTHMOL SR) 325 MG 12 hr capsule Take 1 capsule (325 mg total) by mouth 2 (two) times daily. 11/11/15  Yes CLelon Perla MD  traMADol (ULTRAM) 50 MG tablet Take 1-2 tablets (50-100 mg total) by mouth every 4 (four) hours as needed. 08/05/16  Yes WTanda Rockers MD  doxycycline (VIBRA-TABS) 100 MG tablet Take 1 tablet (100 mg total) by mouth 2 (two) times daily. 09/16/16   Hongalgi, ALenis Dickinson MD  predniSONE (DELTASONE) 10 MG tablet 4 tabs  daily for 2 days, then 3 tabs daily for 2 days, 2 tabs daily for 2 days, then 1 tab daily for 2 days, then stop 09/17/16   HModena Jansky MD     (Not in a hospital admission) Family History  Problem Relation Age of Onset  . Heart disease Mother   . Liver disease Mother        alcohol related  . Asthma Brother   . Esophageal cancer Brother   . Asthma Son   . Colon cancer Neg Hx   . Stomach cancer Neg Hx   . Pancreatic cancer Neg Hx   . Inflammatory bowel disease Neg Hx     Social History   Social History  . Marital status: Married    Spouse name: N/A  . Number of children: 3  . Years of education: N/A   Occupational History  . 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 Smoking status: Former Smoker    Packs/day: 1.00    Years: 35.00    Types: Cigarettes    Quit date: 01/12/1996  . Smokeless tobacco: Never Used  . Alcohol use No  . Drug use: No  . Sexual activity: Not on file   Other Topics Concern  . Not on file   Social History Narrative   Married 1 son 2 daughters   Disabled   2 caffeine/day   Past smoker   11/12/2015        ROS:    As stated in the HPI and negative for all other systems.  Physical Exam: Blood pressure 101/89, pulse (!) 135, temperature 98.4 F (36.9 C), resp. rate 20, height _0  (1.575 m), weight 260 lb (117.9 kg), SpO2 99 %.  GENERAL:  Well appearing HEENT:  Pupils equal round and reactive, fundi not visualized, oral mucosa unremarkable NECK:  No jugular venous distention, waveform within normal limits, carotid upstroke brisk and symmetric, no bruits, no thyromegaly LYMPHATICS:  No cervical, inguinal adenopathy LUNGS:  Decreased breath sounds BACK:  No CVA tenderness CHEST:  Unremarkable HEART:  PMI  not displaced or sustained,S1 and S2 within normal limits, no S3, no clicks, no rubs, no murmurs ABD:  Flat, positive bowel sounds normal in frequency in pitch, no bruits, no rebound, no guarding,  no midline pulsatile mass, no hepatomegaly, no splenomegaly, obese EXT:  2 plus pulses throughout, no edema, no cyanosis no clubbing SKIN:  No rashes no nodules NEURO:  Cranial nerves II through XII grossly intact, motor grossly intact throughout PSYCH:  Cognitively intact, oriented to person place and time    Labs: Lab Results  Component Value Date   BUN 11 10/06/2016   Lab Results  Component Value Date   CREATININE 0.85 10/06/2016   Lab Results  Component Value Date   NA 141 10/06/2016   K 3.7 10/06/2016   CL 101 10/06/2016   CO2 32 10/06/2016   Lab Results  Component Value Date   TROPONINI <0.03 09/15/2016   Lab Results  Component Value Date   WBC 9.5 10/06/2016   HGB 12.3 10/06/2016   HCT 37.5 10/06/2016   MCV 91.2 10/06/2016   PLT 244 10/06/2016    Lab Results  Component Value Date   ALT 13 (L) 09/14/2016   AST 14 (L) 09/14/2016   ALKPHOS 82 09/14/2016   BILITOT 1.3 (H) 09/14/2016    Radiology:   CXR:   No active cardiopulmonary disease.  EKG:  Atrial flutter 2:1.  Axis WNL, intervals WNL, no acute ST T wave changes.   ASSESSMENT AND PLAN:   ATRIAL FLUTTER WITH RVR:  Recurrent and failing propafenone.  At this point she needs to be considered for repeat atrial flutter ablation.  (I can see that she had a procedure in 2010 but I don't see the details).  We will ask EP to see her tomorrow.  For tonight we can try to control her rate with IV Cardizem.  Keep NPO after MN as she might need DCCV.    CHRONIC DIASTOLIC HF:   She seems to be euvolemic.  Continue current therapy.    MORBID OBESITY:  She understands the need to lose weight and she says that she has started on this.    HYPOTHYROID:  She had a norma TSH previously.     SignedMinus Breeding 10/06/2016, 5:46 PM

## 2016-10-06 NOTE — ED Notes (Signed)
Per Dr. Marily Memos, increase rate of cardizem, keep systolic BP above 349, if in 90's keep cardizem going and call him.

## 2016-10-06 NOTE — ED Notes (Signed)
Pt given cup of ice. 

## 2016-10-06 NOTE — ED Notes (Signed)
Dr. Eulis Foster at bedside - per Dr. Eulis Foster do not give more lopressor

## 2016-10-06 NOTE — ED Provider Notes (Addendum)
[place] DEPT Provider Note   CSN: [id number] Arrival date & time: 10/06/16  1253     History   Chief Complaint No chief complaint on file.   HPI [name] [name] is a 66 y.o. female.  She is here for evaluation of feeling poorly, associated with tachycardia and hypertension.  She was recently hospitalized, with atrial fibrillation and rapid ventricular response.  It was noted at that time she had prior multiple cardioversions, including one on 09/15/16. She has been referred to cardiology, for consideration of ablation procedure.   HPI  Past Medical History:  Diagnosis Date  . Anemia    hx  . Anxiety   . Arthritis   . Asthma   . Atrial fibrillation and flutter ([place])   . Atypical lobular hyperplasia of right breast 05/09/2015  . Breast CA ([place])    ?  Marland Kitchen CHF (congestive heart failure) ([place])   . Chronic rhinitis   . Complication of anesthesia   . COPD (chronic obstructive pulmonary disease) ([place])    [place]. PFTs 07/25/06 FEV1 55% ratio 53, DLC0 51% HFA 50% 12/16/2009  . Depression   . FRACTURE, RIB, RIGHT 06/18/2009  . Glucose intolerance (impaired glucose tolerance)    on steroids  . Heart murmur   . History of DVT (deep vein thrombosis)   . Hx of cardiac catheterization    LHC (01/2001):  Normal Cors.  EF 65%.;  LexiScan Myoview (03/2013): No ischemia, EF 61%, normal  . Hx of echocardiogram    Echo (11/2011):  Mod LVH, EF 60-65%, no RWMA, MAC, mild LAE, PASP 34.  Marland Kitchen HYPERLIPIDEMIA   . Hypertension   . Hypothyroidism   . Morbid obesity ([place])    target wt=179lb for BMI<30 Peak wt 282lb  . Osteoporosis    last BMD 4/08 -2.4, intolerant of bisphosphonates  . Peripheral vascular disease (HCC)    hx dvt.pe  . Pneumonia    hx  . PONV (postoperative nausea and vomiting)   . PREMATURE VENTRICULAR CONTRACTIONS   . PULMONARY EMBOLISM, HX OF 06/19/2007  . Rhabdomyolysis 07/29/2009  . Shortness of breath dyspnea   . Tremor   . VITAMIN D DEFICIENCY     Patient Active Problem  List   Diagnosis Date Noted  . Acute upper respiratory infection 09/14/2016  . Atrial fibrillation with RVR ([place]) 09/14/2016  . Atrial flutter with rapid ventricular response ([place])   . Allergic conjunctivitis 07/28/2016  . Influenza-like illness 02/11/2016  . Absolute anemia   . Upper airway cough syndrome 12/25/2015  . Recurrent sinus infections 08/30/2015  . Encounter for pre-operative cardiovascular clearance 05/20/2015  . Morbid obesity ([place]) 05/20/2015  . Atypical lobular hyperplasia of right breast 05/09/2015  . Fever, unspecified 04/11/2015  . Chronic diastolic CHF (congestive heart failure) ([place]) 12/04/2014  . Acute on chronic diastolic CHF (congestive heart failure), NYHA class 1 ([place]) 11/11/2014  . Acute sinus infection 09/03/2014  . Rib pain on right side 04/11/2014  . Paresthesias in left hand 03/04/2014  . Bilateral knee pain 02/13/2014  . Nausea alone 07/10/2013  . Encounter for therapeutic drug monitoring 02/09/2013  . COPD exacerbation ([place]) 01/17/2013  . Chest pain 04/19/2012  . Suicide gesture ([place]) 03/30/2012  . Major depressive disorder, single episode, unspecified 03/30/2012  . Tremor due to multiple drugs 11/11/2011  . Diplopia 11/11/2011  . Allergic rhinitis 09/14/2011  . Right leg weakness 04/14/2011  . Long term current use of anticoagulant therapy 04/22/2010  . Impaired glucose tolerance 04/15/2010  .  Preventative health care 04/15/2010  . Chronic rhinitis 04/15/2010  . PREMATURE VENTRICULAR CONTRACTIONS 10/23/2008  . Atrial flutter ([place]) 07/01/2008  . Cough 06/20/2008  . VITAMIN D DEFICIENCY 10/27/2007  . HYPERLIPIDEMIA 06/19/2007  . Anxiety state 06/19/2007  . PAF (paroxysmal atrial fibrillation) ([place]) 06/19/2007  . PULMONARY EMBOLISM, HX OF 06/19/2007  . Edema 03/20/2007  . Chronic respiratory failure with hypoxia and hypercapnia ([place]) 01/13/2007  . Depression 08/06/2006  . COPD GOLD II 08/06/2006  . Severe obesity (BMI >= 40) ([place])  07/29/2006  . Essential hypertension 07/29/2006  . OSTEOPOROSIS 07/29/2006  . DEEP VENOUS THROMBOPHLEBITIS, HX OF 07/29/2006    Past Surgical History:  Procedure Laterality Date  . ABDOMINAL HYSTERECTOMY    . BREAST LUMPECTOMY WITH RADIOACTIVE SEED LOCALIZATION Right 06/05/2015   Procedure: RIGHT BREAST LUMPECTOMY WITH RADIOACTIVE SEED LOCALIZATION;  Surgeon: Fanny Skates, MD;  Location: [facility];  Service: General;  Laterality: Right;  . BREAST SURGERY Left    s/p mass removal  . CARDIOVERSION N/A 12/11/2014   Procedure: CARDIOVERSION;  Surgeon: Josue Hector, MD;  Location: [facility];  Service: Cardiovascular;  Laterality: N/A;  . CARDIOVERSION N/A 09/15/2016   Procedure: CARDIOVERSION;  Surgeon: Jerline Pain, MD;  Location: [facility];  Service: Cardiovascular;  Laterality: N/A;  . COLONOSCOPY  10/22/2003, ?2006  . COLONOSCOPY WITH PROPOFOL N/A 01/15/2016   Procedure: COLONOSCOPY WITH PROPOFOL;  Surgeon: Gatha Mayer, MD;  Location: [facility];  Service: Endoscopy;  Laterality: N/A;  . ESOPHAGOGASTRODUODENOSCOPY (EGD) WITH PROPOFOL N/A 01/15/2016   Procedure: ESOPHAGOGASTRODUODENOSCOPY (EGD) WITH PROPOFOL;  Surgeon: Gatha Mayer, MD;  Location: [facility];  Service: Endoscopy;  Laterality: N/A;  . s/p left arm fracture with fall off chair    . skin graft to middle R finger  1975  . TEE WITHOUT CARDIOVERSION N/A 12/11/2014   Procedure: TRANSESOPHAGEAL ECHOCARDIOGRAM (TEE);  Surgeon: Josue Hector, MD;  Location: [facility] ENDOSCOPY;  Service: Cardiovascular;  Laterality: N/A;  . TONSILLECTOMY      OB History    No data available       Home Medications    Prior to Admission medications   Medication Sig Start Date End Date Taking? Authorizing Provider  acetaminophen (TYLENOL) 650 MG CR tablet Take 650-1,300 mg by mouth as needed for pain. Per bottle as needed   Yes [provider]  albuterol (PROAIR HFA) 108 (90 Base) MCG/ACT inhaler Inhale 2 puffs into the lungs  every 6 (six) hours as needed for wheezing or shortness of breath. 09/28/16  Yes Tanda Rockers, MD  atorvastatin (LIPITOR) 10 MG tablet Take 1 tablet (10 mg total) by mouth daily. 07/05/16  Yes Biagio Borg, MD  budesonide-formoterol [facility]) 160-4.5 MCG/ACT inhaler Inhale 2 puffs into the lungs 2 (two) times daily. 12/12/15  Yes Parrett, [name] S, NP  Calcium Carbonate-Vitamin D 600-400 MG-UNIT per tablet Take 1 tablet by mouth 2 (two) times daily.     Yes [provider]  Dextromethorphan-Guaifenesin (MUCINEX DM MAXIMUM STRENGTH) 60-1200 MG TB12 Take 1 tablet by mouth every 12 (twelve) hours as needed (cough/congestion). With flutter   Yes [provider]  diltiazem (CARDIZEM CD) 360 MG 24 hr capsule Take 1 capsule (360 mg total) by mouth daily. 10/16/15  Yes Lelon Perla, MD  ELIQUIS 5 MG TABS tablet TAKE 1 TABLET TWICE A DAY 07/19/16  Yes Lelon Perla, MD  ferrous sulfate 325 (65 FE) MG tablet Take 325 mg by mouth 2 (two) times daily with  a meal.    Yes [provider]  FLUoxetine (PROZAC) 40 MG capsule TAKE 1 CAPSULE DAILY 06/22/16  Yes Biagio Borg, MD  fluticasone [facility]) 50 MCG/ACT nasal spray Place 2 sprays into both nostrils 2 (two) times daily as needed for allergies or rhinitis.   Yes [provider]  furosemide (LASIX) 80 MG tablet Take 1 tablet (80 mg total) by mouth 2 (two) times daily. 09/16/16  Yes Hongalgi, Lenis Dickinson, MD  glucosamine-chondroitin 500-400 MG tablet Take 1 tablet by mouth every morning.    Yes [provider]  KLOR-CON M20 20 MEQ tablet Take 1 tablet (20 mEq total) by mouth 2 (two) times daily. Please schedule appointment for refills. 08/04/16  Yes Lelon Perla, MD  levalbuterol Penne Lash) 1.25 MG/3ML nebulizer solution Take 1.25 mg by nebulization every 4 (four) hours. 09/28/16  Yes Tanda Rockers, MD  levothyroxine (SYNTHROID, LEVOTHROID) 25 MCG tablet TAKE 1 TABLET DAILY BEFORE BREAKFAST 01/22/16  Yes Biagio Borg, MD  Olopatadine HCl (PATADAY) 0.2 % SOLN Place 1 drop into both eyes daily.   Yes [provider]  omeprazole (PRILOSEC) 20 MG capsule Take 32ms before supper daily 09/16/16  Yes Hongalgi, ALenis Dickinson MD  OXYGEN Inhale 2.5 L/min into the lungs continuous.   Yes [provider]  propafenone (RYTHMOL SR) 325 MG 12 hr capsule Take 1 capsule (325 mg total) by mouth 2 (two) times daily. 11/11/15  Yes CLelon Perla MD  traMADol (ULTRAM) 50 MG tablet Take 1-2 tablets (50-100 mg total) by mouth every 4 (four) hours as needed. 08/05/16  Yes WTanda Rockers MD  doxycycline (VIBRA-TABS) 100 MG tablet Take 1 tablet (100 mg total) by mouth 2 (two) times daily. 09/16/16   Hongalgi, ALenis Dickinson MD  predniSONE (DELTASONE) 10 MG tablet 4 tabs daily for 2 days, then 3 tabs daily for 2 days, 2 tabs daily for 2 days, then 1 tab daily for 2 days, then stop 09/17/16   HModena Jansky MD    Family History Family History  Problem Relation Age of Onset  . Heart disease Mother   . Liver disease Mother        alcohol related  . Asthma Brother   . Esophageal cancer Brother   . Asthma Son   . Colon cancer Neg Hx   . Stomach cancer Neg Hx   . Pancreatic cancer Neg Hx   . Inflammatory bowel disease Neg Hx     Social History Social History  Substance Use Topics  . Smoking status: Former Smoker    Packs/day: 1.00    Years: 35.00    Types: Cigarettes    Quit date: 01/12/1996  . Smokeless tobacco: Never Used  . Alcohol use No     Allergies   Duloxetine; Penicillins; and Latex   Review of Systems Review of Systems   Physical Exam Updated Vital Signs BP 132/85   Pulse (!) 133   Temp 98.4 F (36.9 C)   Resp (!) 22   Ht _0  (1.575 m)   Wt 117.9 kg (260 lb)   SpO2 99%   BMI 47.55 kg/m   Physical Exam  Constitutional: She is oriented to person, place, and time. She appears well-developed.  Elderly, frail, debilitated  HENT:  Head: Normocephalic and atraumatic.  Eyes: Pupils  are equal, round, and reactive to light. Conjunctivae and EOM are normal.  Neck: Normal range of motion and phonation normal. Neck supple.  Cardiovascular:  Irregular tachycardia  Pulmonary/Chest: Effort normal and breath sounds normal. She exhibits no tenderness.  Abdominal: Soft. She exhibits no distension. There is no tenderness. There is no guarding.  Musculoskeletal: Normal range of motion.  Neurological: She is alert and oriented to person, place, and time. She exhibits normal muscle tone.  Skin: Skin is warm and dry.  Psychiatric: She has a normal mood and affect. Her behavior is normal.  Nursing note and vitals reviewed.    ED Treatments / Results  Labs (all labs ordered are listed, but only abnormal results are displayed) Labs Reviewed  BASIC METABOLIC PANEL - Abnormal; Notable for the following:       Result Value   Glucose, Bld 128 (*)    All other components within normal limits  CBC - Abnormal; Notable for the following:    RDW 15.6 (*)    All other components within normal limits    EKG  EKG Interpretation  Date/Time:  Wednesday October 06 2016 12:56:46 EDT Ventricular Rate:  144 PR Interval:    QRS Duration: 134 QT Interval:  386 QTC Calculation: 597 R Axis:   56 Text Interpretation:  Wide QRS tachycardia Non-specific intra-ventricular conduction block Abnormal ECG Since last tracing rate faster Confirmed by Daleen Bo [phone]) on 10/06/2016 2:40:37 PM      CHA2DS2/VAS Stroke Risk Points      6 >= 2 Points: High Risk  1 - 1.99 Points: Medium Risk  0 Points: Low Risk    The patient's score has not changed in the past year.:  No Change         Details    Note: External data might be a factor in metrics not marked with    Points Metrics   This score determines the patient's risk of having a stroke if the  patient has atrial fibrillation.       1 Has Congestive Heart Failure:  Yes   0 Has Vascular Disease:  No   1 Has Hypertension:  Yes   1 Age:   84   0 Has Diabetes:  No   2 Had Stroke:  No Had TIA:  No Had thromboembolism:  Yes   1 Female:  Yes         Radiology Dg Chest Portable 1 View  Result Date: 10/06/2016 CLINICAL DATA:  A true for ablation. EXAM: PORTABLE CHEST 1 VIEW COMPARISON:  September 14, 2016 FINDINGS: The heart size and mediastinal contours are stable. The heart size is enlarged. There is no focal infiltrate, pulmonary edema, or pleural effusion. The visualized skeletal structures are stable. IMPRESSION: No active cardiopulmonary disease. Electronically Signed   By: Abelardo Diesel M.D.   On: 10/06/2016 13:38    Procedures Procedures (including critical care time)  Medications Ordered in ED Medications  metoprolol tartrate (LOPRESSOR) injection 5 mg (5 mg Intravenous Given 10/06/16 1457)  diltiazem (CARDIZEM) 1 mg/mL load via infusion 10 mg (not administered)    And  diltiazem (CARDIZEM) 100 mg in dextrose 5 % 100 mL (1 mg/mL) infusion (not administered)  sodium chloride 0.9 % bolus 500 mL (0 mLs Intravenous Stopped 10/06/16 1631)     Initial Impression / Assessment and Plan / ED Course  I have reviewed the triage vital signs and the nursing notes.  Pertinent labs & imaging results that were available during my care of the patient were reviewed by me and considered in my medical decision making (see chart for details).  Clinical Course as of Sep  [place] Oct 06, 2016  1631 Normal Sodium: 141 [EW]  1631 Normal CO2: 32 [EW]  1632 Hemoglobin: 12.3 [EW]  1632 Hemoglobin: 12.3 [EW]  1632 Normal Hemoglobin: 12.3 [EW]  1632 No acute disease DG Chest Portable 1 View [EW]  1633 Tachycardia Pulse Rate: (!) 145 [EW]    Clinical Course User Index [EW] Daleen Bo, MD     Patient Vitals for the past 24 hrs:  BP Temp Pulse Resp SpO2 Height Weight  10/06/16 1505 132/85 - (!) 133 (!) 22 99 % - -  10/06/16 1500 (!) 128/94 - (!) 140 17 98 % - -  10/06/16 1455 (!) 127/93 - (!) 141 20 99 % - -  10/06/16 1445  128/72 - (!) 140 17 100 % - -  10/06/16 1430 126/81 - (!) 139 19 98 % - -  10/06/16 1415 (!) 121/98 - (!) 140 16 98 % - -  10/06/16 1400 (!) 137/92 - (!) 141 18 99 % - -  10/06/16 1345 (!) 114/91 - (!) 142 (!) 23 99 % - -  10/06/16 1340 (!) 129/93 - (!) 143 20 99 % - -  10/06/16 1304 - - - - - _0  (1.575 m) 117.9 kg (260 lb)  10/06/16 1303 (!) 147/99 98.4 F (36.9 C) (!) 145 18 98 % - -   -Consult complete with Hospitalist. Patient case explained and discussed. She agrees to admit patient for further evaluation and treatment.  Discussed with Cardiology, to see as consultant.  CRITICAL CARE Performed by: Richarda Blade Total critical care time: 35 minutes Critical care time was exclusive of separately billable procedures and treating other patients. Critical care was necessary to treat or prevent imminent or life-threatening deterioration. Critical care was time spent personally by me on the following activities: development of treatment plan with patient and/or surrogate as well as nursing, discussions with consultants, evaluation of patient's response to treatment, examination of patient, obtaining history from patient or surrogate, ordering and performing treatments and interventions, ordering and review of laboratory studies, ordering and review of radiographic studies, pulse oximetry and re-evaluation of patient's condition.   Final Clinical Impressions(s) / ED Diagnoses   Final diagnoses:  Persistent atrial fibrillation (HCC)  Cough   Recurrent atrial fibrillation, previously failed cardioversions.  Patient was scheduled to see an electrophysiologist, but has not done that yet.  Patient with cough and history of COPD which may be aggravating cardiac instability.  Doubt serious bacterial infection or metabolic instability.  Nursing Notes Reviewed/ Care Coordinated Applicable Imaging Reviewed Interpretation of Laboratory Data incorporated into ED treatment  Plan:  Admit   New Prescriptions New Prescriptions   No medications on file     Daleen Bo, MD 10/06/16 2219    Daleen Bo, MD 11/05/16 [phone]

## 2016-10-06 NOTE — Telephone Encounter (Signed)
New message    Pt c/o BP issue: STAT if pt c/o blurred vision, one-sided weakness or slurred speech  1. What are your last 5 BP readings? 163/104 147 at 1015 116/99 142 at 1140am  2. Are you having any other symptoms (ex. Dizziness, headache, blurred vision, passed out)? Pt states she is burning up   3. What is your BP issue? Pt bp is high

## 2016-10-06 NOTE — Telephone Encounter (Signed)
Received a call from patient she stated she woke up this morning not feeling good, perspiring heavy.Stated B/P 163/104 pulse 147.She took medications and a cool shower, B/P now 116/99 pulse 142.Stated she just don't feel right,no chest pain.Advised to go to [facility] ED.Trish notified.

## 2016-10-06 NOTE — H&P (Signed)
History and Physical    [name] [name] YJE:[id number] DOB: 10-31-1950 DOA: 10/06/2016   PCP: [name] Borg, MD   Patient coming from:  Home    Chief Complaint: Shortness of breath   HPI: [name] [name] is a 66 y.o. female with medical history significant for HTN, paroxysmal atrial fibrillation,/atrial flutter, morbid obesity, Chronic respiratory failure/ COPD on 4 L prn , depression, who presented to the ER with complaints of feeling poorly, associated with tachycardia, "feeling hot ". Of note, the patient was recently hospitalized with similar symptoms, diagnosed with atrial fibrillation with RVR. Of note, the patient had multiple cardioversions, including one on 09/15/2016, and was referred to cardiology in consideration for ablation procedure.On presentation, the patient was noted to have a heart rate goes between 130s 140s, but without any increasing her blood pressure. She is afebrile. She was not hypoxic. She denies any sick contacts. She denies any significant sputum production. She denies any worsening of her wheezing, or orthopnea. She denies any nausea or vomiting. She denies any lower extremity swelling. She denies any tobacco, alcohol, or IV drug use.   ED Course:  BP (!) 144/93   Pulse (!) 134   Temp 98.4 F (36.9 C)   Resp 18   Ht _0  (1.575 m)   Wt 117.9 kg (260 lb)   SpO2 100%   BMI 47.55 kg/m   At the ED, the patient was placed on Cardizem drip, as well as Lopressor as needed IV, with improvement of her rate and Rhythm. She has reported feeling better since presentation. CBC, and BMET are unremarkable. Tn pending Glucose 128 White count 9.5 hemoglobin 12.3 Chest x-ray with no active cardiopulmonary disease EKG Wide QRS tachycardia Non-specific intra-ventricular conduction block Abnormal ECG    Review of Systems:  As per HPI otherwise all other systems reviewed and are negative  Past Medical History:  Diagnosis Date  . Anemia    hx  . Anxiety   . Arthritis     . Asthma   . Atrial fibrillation and flutter ([place])   . Atypical lobular hyperplasia of right breast 05/09/2015  . Breast CA ([place])    ?  Marland Kitchen CHF (congestive heart failure) ([place])   . Chronic rhinitis   . Complication of anesthesia   . COPD (chronic obstructive pulmonary disease) ([place])    [place]. PFTs 07/25/06 FEV1 55% ratio 53, DLC0 51% HFA 50% 12/16/2009  . Depression   . FRACTURE, RIB, RIGHT 06/18/2009  . Glucose intolerance (impaired glucose tolerance)    on steroids  . Heart murmur   . History of DVT (deep vein thrombosis)   . Hx of cardiac catheterization    LHC (01/2001):  Normal Cors.  EF 65%.;  LexiScan Myoview (03/2013): No ischemia, EF 61%, normal  . Hx of echocardiogram    Echo (11/2011):  Mod LVH, EF 60-65%, no RWMA, MAC, mild LAE, PASP 34.  Marland Kitchen HYPERLIPIDEMIA   . Hypertension   . Hypothyroidism   . Morbid obesity ([place])    target wt=179lb for BMI<30 Peak wt 282lb  . Osteoporosis    last BMD 4/08 -2.4, intolerant of bisphosphonates  . Peripheral vascular disease (H[facility])    hx dvt.pe  . Pneumonia    hx  . PONV (postoperative nausea and vomiting)   . PREMATURE VENTRICULAR CONTRACTIONS   . PULMONARY EMBOLISM, HX OF 06/19/2007  . Rhabdomyolysis 07/29/2009  . Shortness of breath dyspnea   . Tremor   . VITAMIN D DEFICIENCY  Past Surgical History:  Procedure Laterality Date  . ABDOMINAL HYSTERECTOMY    . BREAST LUMPECTOMY WITH RADIOACTIVE SEED LOCALIZATION Right 06/05/2015   Procedure: RIGHT BREAST LUMPECTOMY WITH RADIOACTIVE SEED LOCALIZATION;  Surgeon: Fanny Skates, MD;  Location: [facility];  Service: General;  Laterality: Right;  . BREAST SURGERY Left    s/p mass removal  . CARDIOVERSION N/A 12/11/2014   Procedure: CARDIOVERSION;  Surgeon: [name] Hector, MD;  Location: [facility];  Service: Cardiovascular;  Laterality: N/A;  . CARDIOVERSION N/A 09/15/2016   Procedure: CARDIOVERSION;  Surgeon: [name] Pain, MD;  Location: [facility] ENDOSCOPY;  Service: Cardiovascular;  Laterality:  N/A;  . COLONOSCOPY  10/22/2003, ?2006  . COLONOSCOPY WITH PROPOFOL N/A 01/15/2016   Procedure: COLONOSCOPY WITH PROPOFOL;  Surgeon: [name] Mayer, MD;  Location: [facility] ENDOSCOPY;  Service: Endoscopy;  Laterality: N/A;  . ESOPHAGOGASTRODUODENOSCOPY (EGD) WITH PROPOFOL N/A 01/15/2016   Procedure: ESOPHAGOGASTRODUODENOSCOPY (EGD) WITH PROPOFOL;  Surgeon: [name] Mayer, MD;  Location: [facility] ENDOSCOPY;  Service: Endoscopy;  Laterality: N/A;  . s/p left arm fracture with fall off chair    . skin graft to middle R finger  1975  . TEE WITHOUT CARDIOVERSION N/A 12/11/2014   Procedure: TRANSESOPHAGEAL ECHOCARDIOGRAM (TEE);  Surgeon: [name] Hector, MD;  Location[facility] ENDOSCOPY;  Service: Cardiovascular;  Laterality: N/A;  . TONSILLECTOMY      Social History Social History   Social History  . Marital status: Married    Spouse name: N/A  . Number of children: 3  . Years of education: N/A   Occupational History  . 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 Smoking status: Former Smoker    Packs/day: 1.00    Years: 35.00    Types: Cigarettes    Quit date: 01/12/1996  . Smokeless tobacco: Never Used  . Alcohol use No  . Drug use: No  . Sexual activity: Not on file   Other Topics Concern  . Not on file   Social History Narrative   Married 1 son 2 daughters   Disabled   2 caffeine/day   Past smoker   11/12/2015        Allergies  Allergen Reactions  . Duloxetine Other (See Comments)    REACTION: rhabdomyolysis  . Penicillins Other (See Comments)    SYNCOPE Has patient had a PCN reaction causing immediate rash, facial/tongue/throat swelling, SOB or lightheadedness with hypotension: Yes Has patient had a PCN reaction causing severe rash involving mucus membranes or skin necrosis: No Has patient had a PCN reaction that required hospitalization No Has patient had a PCN reaction occurring within the last 10 years: No If all of the above answers are  "NO", then may proceed with Cephalosporin use.   . Latex Rash    Family History  Problem Relation Age of Onset  . Heart disease Mother   . Liver disease Mother        alcohol related  . Asthma Brother   . Esophageal cancer Brother   . Asthma Son   . Colon cancer Neg Hx   . Stomach cancer Neg Hx   . Pancreatic cancer Neg Hx   . Inflammatory bowel disease Neg Hx       Prior to Admission medications   Medication Sig Start Date End Date Taking? Authorizing Provider  acetaminophen (TYLENOL) 650 MG CR tablet Take 650-1,300 mg by mouth as needed for [name]. Per bottle as needed   Yes [provider]  albuterol (PROAIR HFA) 108 (90 Base) MCG/ACT inhaler Inhale 2 puffs into the lungs every 6 (six) hours as needed for wheezing or shortness of breath. 09/28/16  Yes Tanda Rockers, MD  atorvastatin (LIPITOR) 10 MG tablet Take 1 tablet (10 mg total) by mouth daily. 07/05/16  Yes [name] Borg, MD  budesonide-formoterol [facility]) 160-4.5 MCG/ACT inhaler Inhale 2 puffs into the lungs 2 (two) times daily. 12/12/15  Yes Parrett, [name] S, NP  Calcium Carbonate-Vitamin D 600-400 MG-UNIT per tablet Take 1 tablet by mouth 2 (two) times daily.     Yes [provider]  Dextromethorphan-Guaifenesin (MUCINEX DM MAXIMUM STRENGTH) 60-1200 MG TB12 Take 1 tablet by mouth every 12 (twelve) hours as needed (cough/congestion). With flutter   Yes [provider]  diltiazem (CARDIZEM CD) 360 MG 24 hr capsule Take 1 capsule (360 mg total) by mouth daily. 10/16/15  Yes Lelon Perla, MD  ELIQUIS 5 MG TABS tablet TAKE 1 TABLET TWICE A DAY 07/19/16  Yes Lelon Perla, MD  ferrous sulfate 325 (65 FE) MG tablet Take 325 mg by mouth 2 (two) times daily with a meal.    Yes [provider]  FLUoxetine (PROZAC) 40 MG capsule TAKE 1 CAPSULE DAILY 06/22/16  Yes [name] Borg, MD  fluticasone [facility]) 50 MCG/ACT nasal spray Place 2 sprays into both nostrils 2 (two) times daily as needed for  allergies or rhinitis.   Yes [provider]  furosemide (LASIX) 80 MG tablet Take 1 tablet (80 mg total) by mouth 2 (two) times daily. 09/16/16  Yes Hongalgi, Lenis Dickinson, MD  glucosamine-chondroitin 500-400 MG tablet Take 1 tablet by mouth every morning.    Yes [provider]  KLOR-CON M20 20 MEQ tablet Take 1 tablet (20 mEq total) by mouth 2 (two) times daily. Please schedule appointment for refills. 08/04/16  Yes Lelon Perla, MD  levalbuterol Penne Lash) 1.25 MG/3ML nebulizer solution Take 1.25 mg by nebulization every 4 (four) hours. 09/28/16  Yes Tanda Rockers, MD  levothyroxine (SYNTHROID, LEVOTHROID) 25 MCG tablet TAKE 1 TABLET DAILY BEFORE BREAKFAST 01/22/16  Yes [name] Borg, MD  Olopatadine HCl (PATADAY) 0.2 % SOLN Place 1 drop into both eyes daily.   Yes [provider]  omeprazole (PRILOSEC) 20 MG capsule Take 52ms before supper daily 09/16/16  Yes Hongalgi, ALenis Dickinson MD  OXYGEN Inhale 2.5 L/min into the lungs continuous.   Yes [provider]  propafenone (RYTHMOL SR) 325 MG 12 hr capsule Take 1 capsule (325 mg total) by mouth 2 (two) times daily. 11/11/15  Yes CLelon Perla MD  traMADol (ULTRAM) 50 MG tablet Take 1-2 tablets (50-100 mg total) by mouth every 4 (four) hours as needed. 08/05/16  Yes WTanda Rockers MD  doxycycline (VIBRA-TABS) 100 MG tablet Take 1 tablet (100 mg total) by mouth 2 (two) times daily. 09/16/16   Hongalgi, ALenis Dickinson MD  predniSONE (DELTASONE) 10 MG tablet 4 tabs daily for 2 days, then 3 tabs daily for 2 days, 2 tabs daily for 2 days, then 1 tab daily for 2 days, then stop 09/17/16   HModena Jansky MD    Physical Exam:  Vitals:   10/06/16 1455 10/06/16 1500 10/06/16 1505 10/06/16 1638  BP: (!) 127/93 (!) 128/94 132/85 (!) 144/93  Pulse: (!) 141 (!) 140 (!) 133 (!) 134  Resp: 20 17 (!) 22 18  Temp:      SpO2: 99% 98% 99% 100%  Weight:  Height:       Constitutional: NAD, calm, alert and conversant  Eyes:  PERRL, lids and conjunctivae normal ENMT: Mucous membranes are moist, without exudate or lesions  Neck: normal, supple, no masses, no thyromegaly Respiratory: essentially clear to auscultation bilaterally, no significant  Wheezing, rhonchi, no crackles. Normal respiratory effort with O2 [place]  Cardiovascular: Regular rate and rhythm,  murmur, rubs or gallops. No extremity edema. 2+ pedal pulses. No carotid bruits.  Abdomen: Soft, obese non tender, No hepatosplenomegaly. Bowel sounds positive.  Musculoskeletal: no clubbing / cyanosis. Moves all extremities Skin: no jaundice, No lesions. Tattoos noted  Neurologic: Sensation intact  Strength equal in all extremities Psychiatric:   Alert and oriented x 3. Normal mood.     Labs on Admission: I have personally reviewed following labs and imaging studies  CBC:  Recent Labs Lab 10/06/16 1303  WBC 9.5  HGB 12.3  HCT 37.5  MCV 91.2  PLT 413    Basic Metabolic Panel:  Recent Labs Lab 10/06/16 1303  NA 141  K 3.7  CL 101  CO2 32  GLUCOSE 128*  BUN 11  CREATININE 0.85  CALCIUM 8.9    GFR: Estimated Creatinine Clearance: 79.3 mL/min (by C-G formula based on SCr of 0.85 mg/dL).  Liver Function Tests: No results for input(s): AST, ALT, ALKPHOS, BILITOT, PROT, ALBUMIN in the last 168 hours. No results for input(s): LIPASE, AMYLASE in the last 168 hours. No results for input(s): AMMONIA in the last 168 hours.  Coagulation Profile: No results for input(s): INR, PROTIME in the last 168 hours.  Cardiac Enzymes: No results for input(s): CKTOTAL, CKMB, CKMBINDEX, TROPONINI in the last 168 hours.  BNP (last 3 results) No results for input(s): PROBNP in the last 8760 hours.  HbA1C: No results for input(s): HGBA1C in the last 72 hours.  CBG: No results for input(s): GLUCAP in the last 168 hours.  Lipid Profile: No results for input(s): CHOL, HDL, LDLCALC, TRIG, CHOLHDL, LDLDIRECT in the last 72 hours.  Thyroid Function  Tests: No results for input(s): TSH, T4TOTAL, FREET4, T3FREE, THYROIDAB in the last 72 hours.  Anemia Panel: No results for input(s): VITAMINB12, FOLATE, FERRITIN, TIBC, IRON, RETICCTPCT in the last 72 hours.  Urine analysis:    Component Value Date/Time   COLORURINE YELLOW 09/14/2016 0040   APPEARANCEUR HAZY (A) 09/14/2016 0040   LABSPEC 1.021 09/14/2016 0040   PHURINE 5.0 09/14/2016 0040   GLUCOSEU >=500 (A) 09/14/2016 0040   GLUCOSEU NEGATIVE 04/11/2015 1011   HGBUR NEGATIVE 09/14/2016 0040   HGBUR negative 08/06/2008 1214   BILIRUBINUR NEGATIVE 09/14/2016 0040   BILIRUBINUR NEG 04/24/2012 1156   KETONESUR NEGATIVE 09/14/2016 0040   PROTEINUR NEGATIVE 09/14/2016 0040   UROBILINOGEN 2.0 (A) 04/11/2015 1011   NITRITE NEGATIVE 09/14/2016 0040   LEUKOCYTESUR MODERATE (A) 09/14/2016 0040    Sepsis Labs: _0 (procalcitonin:4,lacticidven:4) )No results found for this or any previous visit (from the past 240 hour(s)).   Radiological Exams on Admission: Dg Chest Portable 1 View  Result Date: 10/06/2016 CLINICAL DATA:  A true for ablation. EXAM: PORTABLE CHEST 1 VIEW COMPARISON:  September 14, 2016 FINDINGS: The heart size and mediastinal contours are stable. The heart size is enlarged. There is no focal infiltrate, pulmonary edema, or pleural effusion. The visualized skeletal structures are stable. IMPRESSION: No active cardiopulmonary disease. Electronically Signed   By: Abelardo Diesel M.D.   On: 10/06/2016 13:38    EKG: Independently reviewed.  Assessment/Plan Active Problems:   Atrial fibrillation with  RVR (HCC)   Vitamin D deficiency   Severe obesity (BMI >= 40) (HCC)   Anxiety state   Depression   Essential hypertension   PAF (paroxysmal atrial fibrillation) (HCC)   Atrial flutter (HCC)   Chronic respiratory failure with hypoxia and hypercapnia (HCC)   PULMONARY EMBOLISM, HX OF   DEEP VENOUS THROMBOPHLEBITIS, HX OF   Preventative health care   Long term current  use of anticoagulant therapy   Tremor due to multiple drugs   COPD exacerbation (HCC)   Paroxysmal Atrial Fibrillation with RVR  on anticoagulation with Eliquis. History of cardioversions.  This may have been exacerbated by COPD exacerbation. She had a recent hospitalization for the same complaints, and was to undergo ablation as outpatient.  At home on Cardizem and Rythmol last dose this morning . On presentation the patient was placed on Cardizem drip, as well as Lopressor as needed IV, with improvement of her rate and Rhythm. Initial EKG Wide QRS tachycardia Non-specific intra-ventricular conduction block. Troponin pending. Greater than admission was between 130s 140s, now at 127 130s. Patient was compliant with her meds.   admit to stepdown telemetry Cardiology valuation is pending, appreciate your input. With keep the patient nothing by mouth, in case that ablation is to take place today. Continue Cardizem drip for now, would Lopressor IV as needed, further recommendations as per cardiology. [place] patient with EKGs. Follow troponins Continue Eliquis tonight    COPD, chronic without  exacerbation CXR  without active pulmonary disease  WBC  9.5  Afebrile Currently on O2 at 4 L.  Continue Xopenex prn, Albuterol when rate improves, and home respiratory med   Mucinex O2 prn CBC in am  Chronic diastolic heart failure, last echocardiogram 2013. Weight 260 lbs Appears compensated despite Afib w RVR  Continue Lasix   Daily weights, strict I/O Lasix  CXR in am  Hypertension BP 101/89    Continue home anti-hypertensive medications in am    Hyperlipidemia Continue home statins    History of PE/DVT, no acute issues.  COntinue ELiquis   Depression and Anxiety Continue home  Prozac    Hypothyroidism: Continue home Synthroid   GERD, no acute symptoms Continue PPI     DVT prophylaxis:  ELiquis  Code Status: Full      Family Communication:  Discussed with  patient Disposition Plan: Expect patient to be discharged to home after condition improves Consults called:    Cardiology by EDP  Admission status: SDU OBs    Marchetta Navratil E, PA-C [facility]   10/06/2016, 4:44 PM

## 2016-10-06 NOTE — ED Triage Notes (Signed)
Pt here with Afib/flutter  RVR from home -- started this am around 8. Pt on home O2, became diaphoretic when started no chest pain.

## 2016-10-06 NOTE — Progress Notes (Signed)
Called about patient being in AFIB RVR, patient is already Cardizem 15mg /hr, Rythmol SR, Lopressor 5mg  IV already, per RN, patient is symptomatic now of HR. I asked RNs to call CARDS FELLOW on call to come assess patient at the bedside.  Per RN, pending EP procedure tomorrow.   Upon arrival, patient was alert, pleasant, skin warm, lungs clear uppers/diminished lowers, stated she now breathing better. Primary RN did speak with CARDS MD, received orders for PO Lopressor and IV Lopressor PRN.  SBP maintaining, HR 110-130s AFlutter, oxygen sats > 94% on 2.5 L.     Start Time: 2133 End Time 2155

## 2016-10-07 ENCOUNTER — Ambulatory Visit: Payer: BLUE CROSS/BLUE SHIELD | Admitting: Physician Assistant

## 2016-10-07 ENCOUNTER — Other Ambulatory Visit (HOSPITAL_COMMUNITY): Payer: Medicare Other

## 2016-10-07 ENCOUNTER — Telehealth: Payer: Self-pay | Admitting: Internal Medicine

## 2016-10-07 ENCOUNTER — Encounter (HOSPITAL_COMMUNITY): Payer: Self-pay | Admitting: *Deleted

## 2016-10-07 ENCOUNTER — Observation Stay (HOSPITAL_COMMUNITY): Payer: BLUE CROSS/BLUE SHIELD

## 2016-10-07 DIAGNOSIS — I454 Nonspecific intraventricular block: Secondary | ICD-10-CM | POA: Diagnosis present

## 2016-10-07 DIAGNOSIS — I10 Essential (primary) hypertension: Secondary | ICD-10-CM

## 2016-10-07 DIAGNOSIS — I5032 Chronic diastolic (congestive) heart failure: Secondary | ICD-10-CM | POA: Diagnosis present

## 2016-10-07 DIAGNOSIS — J9611 Chronic respiratory failure with hypoxia: Secondary | ICD-10-CM | POA: Diagnosis present

## 2016-10-07 DIAGNOSIS — Z23 Encounter for immunization: Secondary | ICD-10-CM | POA: Diagnosis not present

## 2016-10-07 DIAGNOSIS — Z79899 Other long term (current) drug therapy: Secondary | ICD-10-CM | POA: Diagnosis not present

## 2016-10-07 DIAGNOSIS — I110 Hypertensive heart disease with heart failure: Secondary | ICD-10-CM | POA: Diagnosis present

## 2016-10-07 DIAGNOSIS — Z7901 Long term (current) use of anticoagulants: Secondary | ICD-10-CM | POA: Diagnosis not present

## 2016-10-07 DIAGNOSIS — R05 Cough: Secondary | ICD-10-CM

## 2016-10-07 DIAGNOSIS — F329 Major depressive disorder, single episode, unspecified: Secondary | ICD-10-CM

## 2016-10-07 DIAGNOSIS — E559 Vitamin D deficiency, unspecified: Secondary | ICD-10-CM | POA: Diagnosis present

## 2016-10-07 DIAGNOSIS — E039 Hypothyroidism, unspecified: Secondary | ICD-10-CM | POA: Diagnosis present

## 2016-10-07 DIAGNOSIS — J9612 Chronic respiratory failure with hypercapnia: Secondary | ICD-10-CM

## 2016-10-07 DIAGNOSIS — J441 Chronic obstructive pulmonary disease with (acute) exacerbation: Secondary | ICD-10-CM | POA: Diagnosis present

## 2016-10-07 DIAGNOSIS — I480 Paroxysmal atrial fibrillation: Secondary | ICD-10-CM | POA: Diagnosis present

## 2016-10-07 DIAGNOSIS — Y92230 Patient room in hospital as the place of occurrence of the external cause: Secondary | ICD-10-CM | POA: Diagnosis not present

## 2016-10-07 DIAGNOSIS — F411 Generalized anxiety disorder: Secondary | ICD-10-CM | POA: Diagnosis present

## 2016-10-07 DIAGNOSIS — Z79890 Hormone replacement therapy (postmenopausal): Secondary | ICD-10-CM | POA: Diagnosis not present

## 2016-10-07 DIAGNOSIS — Z8672 Personal history of thrombophlebitis: Secondary | ICD-10-CM | POA: Diagnosis not present

## 2016-10-07 DIAGNOSIS — E785 Hyperlipidemia, unspecified: Secondary | ICD-10-CM | POA: Diagnosis present

## 2016-10-07 DIAGNOSIS — H538 Other visual disturbances: Secondary | ICD-10-CM | POA: Diagnosis not present

## 2016-10-07 DIAGNOSIS — E876 Hypokalemia: Secondary | ICD-10-CM | POA: Diagnosis present

## 2016-10-07 DIAGNOSIS — I4892 Unspecified atrial flutter: Secondary | ICD-10-CM | POA: Diagnosis not present

## 2016-10-07 DIAGNOSIS — Z86718 Personal history of other venous thrombosis and embolism: Secondary | ICD-10-CM

## 2016-10-07 DIAGNOSIS — K219 Gastro-esophageal reflux disease without esophagitis: Secondary | ICD-10-CM | POA: Diagnosis present

## 2016-10-07 DIAGNOSIS — I4891 Unspecified atrial fibrillation: Secondary | ICD-10-CM | POA: Diagnosis not present

## 2016-10-07 DIAGNOSIS — I483 Typical atrial flutter: Secondary | ICD-10-CM | POA: Diagnosis not present

## 2016-10-07 DIAGNOSIS — Z6841 Body Mass Index (BMI) 40.0 and over, adult: Secondary | ICD-10-CM | POA: Diagnosis not present

## 2016-10-07 DIAGNOSIS — Z86711 Personal history of pulmonary embolism: Secondary | ICD-10-CM | POA: Diagnosis not present

## 2016-10-07 LAB — CBC
HCT: 34.6 % — ABNORMAL LOW (ref 36.0–46.0)
Hemoglobin: 11 g/dL — ABNORMAL LOW (ref 12.0–15.0)
MCH: 29.3 pg (ref 26.0–34.0)
MCHC: 31.8 g/dL (ref 30.0–36.0)
MCV: 92.3 fL (ref 78.0–100.0)
Platelets: 199 10*3/uL (ref 150–400)
RBC: 3.75 MIL/uL — ABNORMAL LOW (ref 3.87–5.11)
RDW: 15.9 % — ABNORMAL HIGH (ref 11.5–15.5)
WBC: 8.1 10*3/uL (ref 4.0–10.5)

## 2016-10-07 LAB — BASIC METABOLIC PANEL
Anion gap: 8 (ref 5–15)
BUN: 7 mg/dL (ref 6–20)
CO2: 33 mmol/L — ABNORMAL HIGH (ref 22–32)
Calcium: 8.1 mg/dL — ABNORMAL LOW (ref 8.9–10.3)
Chloride: 102 mmol/L (ref 101–111)
Creatinine, Ser: 0.64 mg/dL (ref 0.44–1.00)
GFR calc Af Amer: >60 mL/min (ref >60)
GFR calc non Af Amer: >60 mL/min (ref >60)
Glucose, Bld: 156 mg/dL — ABNORMAL HIGH (ref 65–99)
Potassium: 3.2 mmol/L — ABNORMAL LOW (ref 3.5–5.1)
Sodium: 143 mmol/L (ref 135–145)

## 2016-10-07 LAB — MAGNESIUM: Magnesium: 1.8 mg/dL (ref 1.7–2.4)

## 2016-10-07 LAB — PROTIME-INR
INR: 1.15
Prothrombin Time: 14.6 seconds (ref 11.4–15.2)

## 2016-10-07 LAB — MRSA PCR SCREENING: MRSA by PCR: NEGATIVE

## 2016-10-07 MED ORDER — POTASSIUM CHLORIDE 10 MEQ/100ML IV SOLN
10.0000 meq | INTRAVENOUS | Status: AC
  Administered 2016-10-07: 10 meq via INTRAVENOUS
  Filled 2016-10-07 (×3): qty 100

## 2016-10-07 MED ORDER — ORAL CARE MOUTH RINSE
15.0000 mL | Freq: Two times a day (BID) | OROMUCOSAL | Status: DC
  Administered 2016-10-07 – 2016-10-09 (×2): 15 mL via OROMUCOSAL

## 2016-10-07 MED ORDER — INFLUENZA VAC SPLIT HIGH-DOSE 0.5 ML IM SUSY
0.5000 mL | PREFILLED_SYRINGE | INTRAMUSCULAR | Status: AC
  Administered 2016-10-09: 0.5 mL via INTRAMUSCULAR
  Filled 2016-10-07: qty 0.5

## 2016-10-07 MED ORDER — LEVALBUTEROL HCL 1.25 MG/0.5ML IN NEBU
1.2500 mg | INHALATION_SOLUTION | Freq: Four times a day (QID) | RESPIRATORY_TRACT | Status: DC
  Administered 2016-10-07 – 2016-10-09 (×9): 1.25 mg via RESPIRATORY_TRACT
  Filled 2016-10-07 (×9): qty 0.5

## 2016-10-07 NOTE — Discharge Instructions (Addendum)
PROCEDURE SITE CARE No driving for 4 days. No lifting over 5 lbs for 1 week. No vigorous or sexual activity for 1 week. You may return to work on 09/15/16. Keep procedure site clean & dry. If you notice increased pain, swelling, bleeding or pus, call/return!  You may shower, but no soaking baths/hot tubs/pools for 1 week.     Information on my medicine - ELIQUIS (apixaban)  This medication education was reviewed with me or my healthcare representative as part of my discharge preparation.    Why was Eliquis prescribed for you? Eliquis was prescribed for you to reduce the risk of a blood clot forming that can cause a stroke if you have a medical condition called atrial fibrillation (a type of irregular heartbeat).  What do You need to know about Eliquis ? Take your Eliquis TWICE DAILY - one tablet in the morning and one tablet in the evening with or without food. If you have difficulty swallowing the tablet whole please discuss with your pharmacist how to take the medication safely.  Take Eliquis exactly as prescribed by your doctor and DO NOT stop taking Eliquis without talking to the doctor who prescribed the medication.  Stopping may increase your risk of developing a stroke.  Refill your prescription before you run out.  After discharge, you should have regular check-up appointments with your healthcare provider that is prescribing your Eliquis.  In the future your dose may need to be changed if your kidney function or weight changes by a significant amount or as you get older.  What do you do if you miss a dose? If you miss a dose, take it as soon as you remember on the same day and resume taking twice daily.  Do not take more than one dose of ELIQUIS at the same time to make up a missed dose.  Important Safety Information A possible side effect of Eliquis is bleeding. You should call your healthcare provider right away if you experience any of the following: ? Bleeding from an  injury or your nose that does not stop. ? Unusual colored urine (red or dark brown) or unusual colored stools (red or black). ? Unusual bruising for unknown reasons. ? A serious fall or if you hit your head (even if there is no bleeding).  Some medicines may interact with Eliquis and might increase your risk of bleeding or clotting while on Eliquis. To help avoid this, consult your healthcare provider or pharmacist prior to using any new prescription or non-prescription medications, including herbals, vitamins, non-steroidal anti-inflammatory drugs (NSAIDs) and supplements.  This website has more information on Eliquis (apixaban): http://www.eliquis.com/eliquis/home

## 2016-10-07 NOTE — Progress Notes (Signed)
Pt HR afib/flutter 130's since admit with titration of cardizem to 15mg /hr.  Pt previously asymptomatic with HR but now with c/o CP and "feeling hot." Pt noted to be diaphoretic and tachypneic.  Scheduled Rhythmol SR given and PRN Lopressor 5mg  IV given with no effect.  Cardiology fellow paged and rapid response to floor to evaluate patient.  Awaiting return call from cardiology.

## 2016-10-07 NOTE — Consult Note (Signed)
Cardiology Consultation:   Patient ID: [name] [name]; [id number]; 12-08-1950   Admit date: 10/06/2016 Date of Consult: 10/07/2016  Primary Care Provider: Biagio Borg, MD Primary Cardiologist: Dr. Stanford Breed Primary Electrophysiologist:  Dr. Lovena Le (remotely)   Patient Profile:   [name] [name] is a 66 y.o. female with a hx of PAFib and flutter (flutter ablation in 2010), COPD w/chronic O2, HTN, hypothyroidism, HLD, ASD w/PFO by TEE 2016, anxiety/depression, hx opf remote PE, DVT,   who is being seen today for the evaluation of AFlutter at the request of Dr. Percival Spanish.  History of Present Illness:   Ms. Fera states that she has done wel from a heart standpoint for the years since her ablation in 2010, not having any issues until 2016 when she had her DCCV, and not again until earlier this month when she was here for AFlutter RVR, COPD exacerbation, had another DCCV during that stay.  She mentions that this month and the couple before has been unusually stressful with her husband out of work with a knee problem, and finally had surgery a few days ago, and she worries about him.    She had no heart awareness, did not feel like she had any CP or palpitations, but suddenly very hot (though not like fever), finally taking a cool shower after several minutes, this didn't help, found her BP and HR unusually elevated and rechecked again minutes later remained the same 160's/100 or so, and HR near 150 and they decided to have her come in.  Since here she is feeling better this morning, heat has resolved.  She denies any CP associated with the RVR or otherwise.  She reports baseline SOB and her COPD is her limiting factor.  No hx of near syncope or syncope.   LABS K+ 3.2, replacement ordered already BUN/Creat 7/0.64 Mag 1.8 WBC 8.1 H/H 11/34/plts 199 Trop I: <0.03 x3  AF history: AFlutter ablation 8/302010, Dr. Lovena Le AFib dx dates back to prior to the AFlutter (unknown initial diagnosis  timing) 2016 recurrent AFlutter DCCV : 12/11/14, 09/15/16 AAD propafenone going back to at least 2010   Past Medical History:  Diagnosis Date  . Anemia    hx  . Anxiety   . Arthritis   . Asthma   . Atrial fibrillation and flutter ([place])   . Atypical lobular hyperplasia of right breast 05/09/2015  . Breast CA ([place])    ?  Marland Kitchen Chronic rhinitis   . COPD (chronic obstructive pulmonary disease) ([place])    [place]. PFTs 07/25/06 FEV1 55% ratio 53, DLC0 51% HFA 50% 12/16/2009  . Depression   . FRACTURE, RIB, RIGHT 06/18/2009  . Glucose intolerance (impaired glucose tolerance)    on steroids  . History of DVT (deep vein thrombosis)   . Hx of cardiac catheterization    LHC (01/2001):  Normal Cors.  EF 65%.;  LexiScan Myoview (03/2013): No ischemia, EF 61%, normal  . Hx of echocardiogram    Echo (11/2011):  Mod LVH, EF 60-65%, no RWMA, MAC, mild LAE, PASP 34.  Marland Kitchen HYPERLIPIDEMIA   . Hypertension   . Hypothyroidism   . Morbid obesity ([place])    target wt=179lb for BMI<30 Peak wt 282lb  . Osteoporosis    last BMD 4/08 -2.4, intolerant of bisphosphonates  . Peripheral vascular disease (HCC)    hx dvt.pe  . Pneumonia    hx  . PONV (postoperative nausea and vomiting)   . PREMATURE VENTRICULAR CONTRACTIONS   . PULMONARY EMBOLISM,  HX OF 06/19/2007  . Rhabdomyolysis 07/29/2009  . Tremor   . VITAMIN D DEFICIENCY     Past Surgical History:  Procedure Laterality Date  . ABDOMINAL HYSTERECTOMY    . BREAST LUMPECTOMY WITH RADIOACTIVE SEED LOCALIZATION Right 06/05/2015   Procedure: RIGHT BREAST LUMPECTOMY WITH RADIOACTIVE SEED LOCALIZATION;  Surgeon: Fanny Skates, MD;  Location: [facility];  Service: General;  Laterality: Right;  . BREAST SURGERY Left    s/p mass removal  . CARDIOVERSION N/A 12/11/2014   Procedure: CARDIOVERSION;  Surgeon: Josue Hector, MD;  Location: [facility];  Service: Cardiovascular;  Laterality: N/A;  . CARDIOVERSION N/A 09/15/2016   Procedure: CARDIOVERSION;  Surgeon: Jerline Pain,  MD;  Location: [facility] ENDOSCOPY;  Service: Cardiovascular;  Laterality: N/A;  . COLONOSCOPY  10/22/2003, ?2006  . COLONOSCOPY WITH PROPOFOL N/A 01/15/2016   Procedure: COLONOSCOPY WITH PROPOFOL;  Surgeon: Gatha Mayer, MD;  Location: [facility] ENDOSCOPY;  Service: Endoscopy;  Laterality: N/A;  . ESOPHAGOGASTRODUODENOSCOPY (EGD) WITH PROPOFOL N/A 01/15/2016   Procedure: ESOPHAGOGASTRODUODENOSCOPY (EGD) WITH PROPOFOL;  Surgeon: Gatha Mayer, MD;  Location: [facility] ENDOSCOPY;  Service: Endoscopy;  Laterality: N/A;  . s/p left arm fracture with fall off chair    . skin graft to middle R finger  1975  . TEE WITHOUT CARDIOVERSION N/A 12/11/2014   Procedure: TRANSESOPHAGEAL ECHOCARDIOGRAM (TEE);  Surgeon: Josue Hector, MD;  Location: [facility] ENDOSCOPY;  Service: Cardiovascular;  Laterality: N/A;  . TONSILLECTOMY         Inpatient Medications: Scheduled Meds: . apixaban  5 mg Oral BID  . atorvastatin  10 mg Oral Daily  . calcium-vitamin D  1 tablet Oral BID  . diltiazem  360 mg Oral Daily  . ferrous sulfate  325 mg Oral BID WC  . FLUoxetine  40 mg Oral Daily  . furosemide  80 mg Oral BID  . [START ON 10/08/2016] Influenza vac split quadrivalent PF  0.5 mL Intramuscular Tomorrow-1000  . levalbuterol  1.25 mg Nebulization QID  . levothyroxine  25 mcg Oral QAC breakfast  . mouth rinse  15 mL Mouth Rinse BID  . metoprolol tartrate  12.5 mg Oral Q6H  . mometasone-formoterol  2 puff Inhalation BID  . olopatadine  1 drop Both Eyes BID  . pantoprazole  40 mg Oral Daily  . potassium chloride SA  20 mEq Oral BID  . propafenone  325 mg Oral BID   Continuous Infusions: . diltiazem (CARDIZEM) infusion 10 mg/hr (10/07/16 1001)   PRN Meds: acetaminophen **OR** acetaminophen, albuterol, bisacodyl, dextromethorphan-guaiFENesin, HYDROcodone-acetaminophen, metoprolol tartrate, ondansetron **OR** ondansetron (ZOFRAN) IV, senna-docusate, traMADol  Allergies:    Allergies  Allergen Reactions  . Duloxetine Other (See  Comments)    REACTION: rhabdomyolysis  . Penicillins Other (See Comments)    SYNCOPE Has patient had a PCN reaction causing immediate rash, facial/tongue/throat swelling, SOB or lightheadedness with hypotension: Yes Has patient had a PCN reaction causing severe rash involving mucus membranes or skin necrosis: No Has patient had a PCN reaction that required hospitalization No Has patient had a PCN reaction occurring within the last 10 years: No If all of the above answers are "NO", then may proceed with Cephalosporin use.   . Latex Rash    Social History:   Social History   Social History  . Marital status: Married    Spouse name: N/A  . Number of children: 3  . Years of education: N/A   Occupational History  . 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 Smoking status: Former Smoker    Packs/day: 1.00    Years: 35.00    Types: Cigarettes    Quit date: 01/12/1996  . Smokeless tobacco: Never Used  . Alcohol use No  . Drug use: No  . Sexual activity: Yes   Other Topics Concern  . Not on file   Social History Narrative   Married 1 son 2 daughters   Disabled   2 caffeine/day   Past smoker   11/12/2015       Family History:    Family History  Problem Relation Age of Onset  . Heart disease Mother   . Liver disease Mother        alcohol related  . Asthma Brother   . Esophageal cancer Brother   . Asthma Son   . Colon cancer Neg Hx   . Stomach cancer Neg Hx   . Pancreatic cancer Neg Hx   . Inflammatory bowel disease Neg Hx      ROS:  Please see the history of present illness.  ROS  All other ROS reviewed and negative.     Physical Exam/Data:   Vitals:   10/07/16 0516 10/07/16 0804 10/07/16 0900 10/07/16 1000  BP:   (!) 116/92 101/73  Pulse:   65 (!) 139  Resp:   14 20  Temp:  98.4 F (36.9 C)    TempSrc:  Oral    SpO2:   99%   Weight: 257 lb 8 oz (116.8 kg)     Height:        Intake/Output Summary (Last 24  hours) at 10/07/16 1126 Last data filed at 10/07/16 1001  Gross per 24 hour  Intake           910.66 ml  Output             1750 ml  Net          -839.34 ml   Filed Weights   10/06/16 1304 10/06/16 1900 10/07/16 0516  Weight: 260 lb (117.9 kg) 256 lb 2.8 oz (116.2 kg) 257 lb 8 oz (116.8 kg)   Body mass index is 47.1 kg/m.  General:  Well nourished, obese, well developed, in no acute distress HEENT: normal Lymph: no adenopathy Neck: no JVD Endocrine:  No thryomegaly Vascular: No carotid bruits Cardiac:  IRRR; no murmurs, gallops or rubs are appreciated Lungs:  CTA b/l w slight end exp wheeze b/l, no rhonchi or rales  Abd: soft, nontender, obese Ext: no edema Musculoskeletal:  No deformities, BUE and BLE strength normal and equal Skin: warm and dry  Neuro:  No gross focal abnormalities noted Psych:  Normal affect   EKG:  The EKG was personally reviewed and demonstrates:   Initial: AFlutter 144bpm, QRS 134m Last: AFlutter 114bpm, QRS 759m looks typical Telemetry:  Telemetry was personally reviewed and demonstrates:   AFlutter currently 80's  Relevant CV Studies:  Echo has been ordered is pending  12/10/16: TEE Normal EF 60% Mild MR Mild LAE Mild RAE No LAA thrombus Atrial septal aneurysm with PFO Normal RV Normal AV No Effusion  Laboratory Data:  Chemistry Recent Labs Lab 10/06/16 1303 10/07/16 0314  NA 141 143  K 3.7 3.2*  CL 101 102  CO2 32 33*  GLUCOSE 128* 156*  BUN 11 7  CREATININE 0.85 0.64  CALCIUM 8.9 8.1*  GFRNONAA >60 >60  GFRAA >60 >60  ANIONGAP 8 8    No results for input(s):  PROT, ALBUMIN, AST, ALT, ALKPHOS, BILITOT in the last 168 hours. Hematology Recent Labs Lab 10/06/16 1303 10/07/16 0314  WBC 9.5 8.1  RBC 4.11 3.75*  HGB 12.3 11.0*  HCT 37.5 34.6*  MCV 91.2 92.3  MCH 29.9 29.3  MCHC 32.8 31.8  RDW 15.6* 15.9*  PLT 244 199   Cardiac Enzymes Recent Labs Lab 10/06/16 1727 10/06/16 2001 10/06/16 2229  TROPONINI  <0.03 <0.03 <0.03   No results for input(s): TROPIPOC in the last 168 hours.  BNPNo results for input(s): BNP, PROBNP in the last 168 hours.  DDimer No results for input(s): DDIMER in the last 168 hours.  Radiology/Studies:  Dg Chest 2 View Result Date: 10/07/2016 CLINICAL DATA:  Four days of shortness of breath. History of atrial fibrillation, chronic respiratory failure, previous pulmonary embolism, former smoker, COPD. EXAM: CHEST  2 VIEW COMPARISON:  Portal chest x-ray of October 06, 2016 FINDINGS: Today's frontal view is obtained in a lordotic fashion. The lungs remain well-expanded. The interstitial markings are mildly prominent. The cardiac silhouette remains enlarged. The central pulmonary vascularity is prominent. There is no pleural effusion or pneumothorax. There is no discrete infiltrate. IMPRESSION: COPD with superimposed low-grade CHF. There is no alveolar pneumonia nor edema. There is no pleural effusion. Electronically Signed   By: David  Martinique M.D.   On: 10/07/2016 08:33      Assessment and Plan:   1. AFlutter w/RVR S/p ablation in 2010 and has done well with only one event in 2016 and not again until this month 2 episodes.     CHA2DS2Vasc is at least 3 on Eliquis appropriately dosed.     She reports no missed doses of her [place] for the last 3 weeks and even longer     She carries initial dx of AFib  Echo is pending  Discussed potentially a repeat ablation, she would very much like to consider this since she had done so well for so long  Dr. Rayann Heman will see later today,will look at schedule availability tomorrow  2. Chronic resp failure, COPD     She reports at her baseline     On home O2  3. HTN     No changes today    For questions or updates, please contact [place] Please consult [email or web address] for contact info under Cardiology/STEMI.   Signed, Baldwin Jamaica, PA-C  10/07/2016 11:26 AM   I have seen, examined the patient, and reviewed the above  assessment and plan.  Changes to above are made where necessary.  On exam, morbidly obese and chronically ill appearing. She has recurrent typical appearing atrial flutter post ablation.  I do not see recent afib.  She has failed medical therapy with rhythmol.  I would therefore recommend repeat ablation. Will make NPO for possible atrial flutter ablation by Dr Lovena Le tomorrow.  If schedule does not allow, could consider cardioversion instead, though I worry that this may not be the best option as she was just cardioverted several days ago and already has recurrence.  Co Sign: Thompson Grayer, MD 10/07/2016 5:13 PM

## 2016-10-07 NOTE — Progress Notes (Signed)
PROGRESS NOTE    [name] [name]  UXL:[id number] DOB: July 27, 1950 DOA: 10/06/2016 PCP: Biagio Borg, MD   Brief Narrative: [name] [name] is a 66 y.o. female with medical history significant for HTN, paroxysmal atrial fibrillation,/atrial flutter, morbid obesity, Chronic respiratory failure/ COPD on 4 L prn , depression, who presented to the ER with complaints of feeling poorly, associated with tachycardia, "feeling hot." Of note, the patient was recently hospitalized with similar symptoms, diagnosed with atrial fibrillation with RVR. The patient has had multiple cardioversions, including one on 09/15/2016, and was referred to cardiology in consideration for ablation procedure.On presentation, the patient was noted to have a heart rate goes between 130s 140s, but without any increasing her blood pressure. She is afebrile. She was not hypoxic. She was admitted for Atrial Flutter with RVR and is to possibly undergo ablation in AM.   Assessment & Plan:   Active Problems:   Vitamin D deficiency   Severe obesity (BMI >= 40) (HCC)   Anxiety state   Depression   Essential hypertension   PAF (paroxysmal atrial fibrillation) (HCC)   Atrial flutter (HCC)   Chronic respiratory failure with hypoxia and hypercapnia (HCC)   PULMONARY EMBOLISM, HX OF   DEEP VENOUS THROMBOPHLEBITIS, HX OF   Preventative health care   Long term current use of anticoagulant therapy   Tremor due to multiple drugs   COPD exacerbation (HCC)   Atrial fibrillation with RVR (HCC)  Paroxysmal Atrial Flutter with RVR on Anticoagulation -Has Hx of Atrial Fib/Flutter with RVR -History of cardioversion's and recently Cardioverted several days ago -This may have been exacerbated by COPD exacerbation. She had a recent hospitalization for the same complaints, and was to undergo ablation as outpatient.   -At home on Cardizem and Rythmol -n presentation the patient was placed on Cardizem drip, as well as Lopressor as needed IV, with  improvement of her rate and Rhythm.  -Initial EKG Wide QRS tachycardia Non-specific intra-ventricular conduction block.  -Admit to Stepdown Telemetry -Continue Cardizem drip for now and po Cardizem and IV Metoprolol and po Metoprolol  -Cardiology Dr. Percival Spanish evaluated and consulted EP and patient was to undergo DCCV today but will hold in anticipation for Ablation tomorrow to likely be Done by Dr. Lovena Le -NPO at [facility] -Cardiology Fellow added po Lopressor last night at 12.5 mg po q6h and C/w Cardizem 360 mg po Daily -Repeat ECHO Pending  -C/w Propafenone 325 mg po BID -Continue to Follow Cardiology Recc's  COPD, chronic without Exacerbation  -CXR without active pulmonary disease  -Continue Xopenex prn, Albuterol when rate improves, and home Respiratory meds with Dulera   -C/w Mucinex DM -C/w Supplemental O2  Chronic Diastolic Heart Failure -Last echocardiogram 2013. Weight 260 lbs Appears -Compensated despite Afib w RVR  -Continue Lasix 80 mg po BID  -C/w Daily weights, strict I/O  Hypertension  Continue Antihypertensive Medications with Metoprolol and Cardizem -C/w Home Lasix  Hyperlipidemia Continue home Atorvastatin 10 mg  History of PE/DVT,  -no acute issues.  -C/w Apixaban  Depression and Anxiety -Continue home Fluoxetine   Hypothyroidism -TSH on 09/15/16 was 0.993; Will repeat given Atrial Flutter with RVR -Continue home Levothyroxine 25 mcg po Daily   GERD  -No acute symptoms -Continue PPI with Pantoprazole 40 mg po Daily  Hypokalemia -Patient's K+ was 3.2 this AM -Replete with po KCl 20 mEQ BID -Check Mag Level this AM -Continue to Monitor and Replete as Necessary -Repeat K+ in AM   DVT prophylaxis: Anticoagulated with  Apixaban Code Status: FULL CODE Family Communication: Discussed with Family at bedside Disposition Plan: Remain Inpatient for Ablation  Consultants:   Cardiology  Electrophysiology  Procedures:  Possible Ablation  10/08/16   Antimicrobials:  Anti-infectives    None     Subjective: Seen and examined and states she has some SOB but no real CP. Denied lightheadedness or dizziness at this time.   Objective: Vitals:   10/07/16 0235 10/07/16 0300 10/07/16 0500 10/07/16 0516  BP: 101/61 108/71 99/69   Pulse: (!) 130 (!) 129 (!) 128   Resp: 16 20 14    Temp: 98.7 F (37.1 C)     TempSrc: Oral     SpO2: 100%     Weight:    116.8 kg (257 lb 8 oz)  Height:        Intake/Output Summary (Last 24 hours) at 10/07/16 0734 Last data filed at 10/07/16 0523  Gross per 24 hour  Intake           910.66 ml  Output             1650 ml  Net          -739.34 ml   Filed Weights   10/06/16 1304 10/06/16 1900 10/07/16 0516  Weight: 117.9 kg (260 lb) 116.2 kg (256 lb 2.8 oz) 116.8 kg (257 lb 8 oz)   Examination: Physical Exam:  Constitutional: WN/WD obese Caucasian female NAD and appears calm and comfortable sitting on edge of bed Eyes: Lids and conjunctivae normal, sclerae anicteric  ENMT: External Ears, Nose appear normal. Grossly normal hearing. Mucous membranes are moist. Neck: Appears normal, supple, no cervical masses, normal ROM, no appreciable thyromegaly, no JVD Respiratory: Diminished to auscultation bilaterally with mild wheezes; No rales, rhonchi or crackles. Normal respiratory effort and patient is not tachypenic. No accessory muscle use.  Cardiovascular: Irregularly Irregular, no murmurs / rubs / gallops. S1 and S2 auscultated. No extremity edema.  Abdomen: Soft, non-tender, Distended due to body habitus. No masses palpated. No appreciable hepatosplenomegaly. Bowel sounds positive.  GU: Deferred. Musculoskeletal: No clubbing / cyanosis of digits/nails. No joint deformity upper and lower extremities. Good ROM, no contractures. Skin: No rashes, lesions, ulcers on a limited skin eval. No induration; Warm and dry.  Neurologic: CN 2-12 grossly intact with no focal deficits. Romberg sign cerebellar  reflexes not assessed.  Psychiatric: Normal judgment and insight. Alert and oriented x 3. Normal mood and appropriate affect.   Data Reviewed: I have personally reviewed following labs and imaging studies  CBC:  Recent Labs Lab 10/06/16 1303 10/07/16 0314  WBC 9.5 8.1  HGB 12.3 11.0*  HCT 37.5 34.6*  MCV 91.2 92.3  PLT 244 474   Basic Metabolic Panel:  Recent Labs Lab 10/06/16 1303 10/07/16 0314  NA 141 143  K 3.7 3.2*  CL 101 102  CO2 32 33*  GLUCOSE 128* 156*  BUN 11 7  CREATININE 0.85 0.64  CALCIUM 8.9 8.1*   GFR: Estimated Creatinine Clearance: 83.9 mL/min (by C-G formula based on SCr of 0.64 mg/dL). Liver Function Tests: No results for input(s): AST, ALT, ALKPHOS, BILITOT, PROT, ALBUMIN in the last 168 hours. No results for input(s): LIPASE, AMYLASE in the last 168 hours. No results for input(s): AMMONIA in the last 168 hours. Coagulation Profile:  Recent Labs Lab 10/07/16 0314  INR 1.15   Cardiac Enzymes:  Recent Labs Lab 10/06/16 1727 10/06/16 2001 10/06/16 2229  TROPONINI <0.03 <0.03 <0.03   BNP (last 3 results)  No results for input(s): PROBNP in the last 8760 hours. HbA1C: No results for input(s): HGBA1C in the last 72 hours. CBG: No results for input(s): GLUCAP in the last 168 hours. Lipid Profile: No results for input(s): CHOL, HDL, LDLCALC, TRIG, CHOLHDL, LDLDIRECT in the last 72 hours. Thyroid Function Tests: No results for input(s): TSH, T4TOTAL, FREET4, T3FREE, THYROIDAB in the last 72 hours. Anemia Panel: No results for input(s): VITAMINB12, FOLATE, FERRITIN, TIBC, IRON, RETICCTPCT in the last 72 hours. Sepsis Labs: No results for input(s): PROCALCITON, LATICACIDVEN in the last 168 hours.  Recent Results (from the past 240 hour(s))  MRSA PCR Screening     Status: None   Collection Time: 10/06/16  7:48 PM  Result Value Ref Range Status   MRSA by PCR NEGATIVE NEGATIVE Final    Comment:        The GeneXpert MRSA Assay  (FDA approved for NASAL specimens only), is one component of a comprehensive MRSA colonization surveillance program. It is not intended to diagnose MRSA infection nor to guide or monitor treatment for MRSA infections.     Radiology Studies: Dg Chest Portable 1 View  Result Date: 10/06/2016 CLINICAL DATA:  A true for ablation. EXAM: PORTABLE CHEST 1 VIEW COMPARISON:  September 14, 2016 FINDINGS: The heart size and mediastinal contours are stable. The heart size is enlarged. There is no focal infiltrate, pulmonary edema, or pleural effusion. The visualized skeletal structures are stable. IMPRESSION: No active cardiopulmonary disease. Electronically Signed   By: Abelardo Diesel M.D.   On: 10/06/2016 13:38   Scheduled Meds: . apixaban  5 mg Oral BID  . atorvastatin  10 mg Oral Daily  . calcium-vitamin D  1 tablet Oral BID  . diltiazem  360 mg Oral Daily  . ferrous sulfate  325 mg Oral BID WC  . FLUoxetine  40 mg Oral Daily  . furosemide  80 mg Oral BID  . [START ON 10/08/2016] Influenza vac split quadrivalent PF  0.5 mL Intramuscular Tomorrow-1000  . levalbuterol  1.25 mg Nebulization QID  . levothyroxine  25 mcg Oral QAC breakfast  . mouth rinse  15 mL Mouth Rinse BID  . metoprolol tartrate  12.5 mg Oral Q6H  . mometasone-formoterol  2 puff Inhalation BID  . olopatadine  1 drop Both Eyes BID  . pantoprazole  40 mg Oral Daily  . potassium chloride SA  20 mEq Oral BID  . propafenone  325 mg Oral BID   Continuous Infusions: . diltiazem (CARDIZEM) infusion 10 mg/hr (10/07/16 0630)    LOS: 0 days   Kerney Elbe, DO [facility] Pager [phone]  If 7PM-7AM, please contact night-coverage [email or web address] Password [facility] 10/07/2016, 7:34 AM

## 2016-10-07 NOTE — Progress Notes (Signed)
Received return call from cardiology.  Orders received for scheduled PO metoprolol and IV metoprolol PRN.  Patient states "I'm feeling better."  Pt denies worsening SOB or chest pain and noted tachypnea to be decreased.  Pt educated on symptoms and to call if they return.  Pt verbalizes understanding.  HR currently 110-130's Aflutter.

## 2016-10-07 NOTE — Progress Notes (Signed)
Notified by ccmd of 2.34 second pause on telemetry.  Hospitalist T. Opyd notified for pause > 2 seconds with pt on cardizem drip.  Order received for magnesium level.

## 2016-10-07 NOTE — Telephone Encounter (Signed)
Attempted to call [facility]---on hold for over 5 mins---due to all representatives being busy---will need to try back later and need to know the name of the medication

## 2016-10-08 ENCOUNTER — Encounter (HOSPITAL_COMMUNITY)
Admission: EM | Disposition: A | Discharge status: Home / Self Care | Payer: Self-pay | Source: Home / Self Care | Attending: Internal Medicine

## 2016-10-08 ENCOUNTER — Inpatient Hospital Stay (HOSPITAL_COMMUNITY): Payer: BLUE CROSS/BLUE SHIELD

## 2016-10-08 ENCOUNTER — Encounter (HOSPITAL_COMMUNITY): Payer: Self-pay | Admitting: Internal Medicine

## 2016-10-08 DIAGNOSIS — E876 Hypokalemia: Secondary | ICD-10-CM

## 2016-10-08 DIAGNOSIS — H538 Other visual disturbances: Secondary | ICD-10-CM

## 2016-10-08 DIAGNOSIS — I483 Typical atrial flutter: Secondary | ICD-10-CM

## 2016-10-08 DIAGNOSIS — I4891 Unspecified atrial fibrillation: Secondary | ICD-10-CM

## 2016-10-08 HISTORY — PX: A-FLUTTER ABLATION: EP1230

## 2016-10-08 LAB — COMPREHENSIVE METABOLIC PANEL
ALT: 12 U/L — ABNORMAL LOW (ref 14–54)
AST: 13 U/L — ABNORMAL LOW (ref 15–41)
Albumin: 3.1 g/dL — ABNORMAL LOW (ref 3.5–5.0)
Alkaline Phosphatase: 68 U/L (ref 38–126)
Anion gap: 7 (ref 5–15)
BUN: 11 mg/dL (ref 6–20)
CO2: 32 mmol/L (ref 22–32)
Calcium: 8.4 mg/dL — ABNORMAL LOW (ref 8.9–10.3)
Chloride: 101 mmol/L (ref 101–111)
Creatinine, Ser: 0.75 mg/dL (ref 0.44–1.00)
GFR calc Af Amer: >60 mL/min (ref >60)
GFR calc non Af Amer: >60 mL/min (ref >60)
Glucose, Bld: 164 mg/dL — ABNORMAL HIGH (ref 65–99)
Potassium: 3.6 mmol/L (ref 3.5–5.1)
Sodium: 140 mmol/L (ref 135–145)
Total Bilirubin: 0.9 mg/dL (ref 0.3–1.2)
Total Protein: 5.5 g/dL — ABNORMAL LOW (ref 6.5–8.1)

## 2016-10-08 LAB — CBC WITH DIFFERENTIAL/PLATELET
Basophils Absolute: 0 10*3/uL (ref 0.0–0.1)
Basophils Relative: 0 %
Eosinophils Absolute: 0.2 10*3/uL (ref 0.0–0.7)
Eosinophils Relative: 2 %
HCT: 32.9 % — ABNORMAL LOW (ref 36.0–46.0)
Hemoglobin: 10.8 g/dL — ABNORMAL LOW (ref 12.0–15.0)
Lymphocytes Relative: 33 %
Lymphs Abs: 2.6 10*3/uL (ref 0.7–4.0)
MCH: 30.3 pg (ref 26.0–34.0)
MCHC: 32.8 g/dL (ref 30.0–36.0)
MCV: 92.2 fL (ref 78.0–100.0)
Monocytes Absolute: 0.6 10*3/uL (ref 0.1–1.0)
Monocytes Relative: 8 %
Neutro Abs: 4.5 10*3/uL (ref 1.7–7.7)
Neutrophils Relative %: 57 %
Platelets: 207 10*3/uL (ref 150–400)
RBC: 3.57 MIL/uL — ABNORMAL LOW (ref 3.87–5.11)
RDW: 16.1 % — ABNORMAL HIGH (ref 11.5–15.5)
WBC: 7.8 10*3/uL (ref 4.0–10.5)

## 2016-10-08 LAB — PHOSPHORUS: Phosphorus: 4.2 mg/dL (ref 2.5–4.6)

## 2016-10-08 LAB — MAGNESIUM: Magnesium: 1.9 mg/dL (ref 1.7–2.4)

## 2016-10-08 SURGERY — A-FLUTTER ABLATION

## 2016-10-08 MED ORDER — OFF THE BEAT BOOK
Freq: Once | Status: AC
  Administered 2016-10-08: 08:00:00
  Filled 2016-10-08 (×2): qty 1

## 2016-10-08 MED ORDER — MIDAZOLAM HCL 5 MG/5ML IJ SOLN
INTRAMUSCULAR | Status: DC | PRN
  Administered 2016-10-08 (×3): 1 mg via INTRAVENOUS
  Administered 2016-10-08: 2 mg via INTRAVENOUS
  Administered 2016-10-08: 1 mg via INTRAVENOUS

## 2016-10-08 MED ORDER — BUPIVACAINE HCL (PF) 0.25 % IJ SOLN
INTRAMUSCULAR | Status: DC | PRN
  Administered 2016-10-08: 30 mL

## 2016-10-08 MED ORDER — HEPARIN (PORCINE) IN NACL 2-0.9 UNIT/ML-% IJ SOLN
INTRAMUSCULAR | Status: AC
  Filled 2016-10-08: qty 500

## 2016-10-08 MED ORDER — FENTANYL CITRATE (PF) 100 MCG/2ML IJ SOLN
INTRAMUSCULAR | Status: DC | PRN
  Administered 2016-10-08: 12.5 ug via INTRAVENOUS
  Administered 2016-10-08 (×2): 25 ug via INTRAVENOUS
  Administered 2016-10-08: 12.5 ug via INTRAVENOUS

## 2016-10-08 MED ORDER — BUPIVACAINE HCL (PF) 0.25 % IJ SOLN
INTRAMUSCULAR | Status: AC
  Filled 2016-10-08: qty 30

## 2016-10-08 MED ORDER — HEPARIN (PORCINE) IN NACL 2-0.9 UNIT/ML-% IJ SOLN
INTRAMUSCULAR | Status: AC | PRN
  Administered 2016-10-08: 500 mL

## 2016-10-08 MED ORDER — FENTANYL CITRATE (PF) 100 MCG/2ML IJ SOLN
INTRAMUSCULAR | Status: AC
  Filled 2016-10-08: qty 2

## 2016-10-08 MED ORDER — MIDAZOLAM HCL 5 MG/5ML IJ SOLN
INTRAMUSCULAR | Status: AC
  Filled 2016-10-08: qty 5

## 2016-10-08 SURGICAL SUPPLY — 11 items
BAG SNAP BAND KOVER 36X36 (MISCELLANEOUS) ×2 IMPLANT
CATH EZ STEER NAV 8MM F-J CUR (ABLATOR) ×2 IMPLANT
CATH WEBSTER BI DIR CS D-F CRV (CATHETERS) ×2 IMPLANT
HOVERMATT SINGLE USE (MISCELLANEOUS) ×4 IMPLANT
PACK EP LATEX FREE (CUSTOM PROCEDURE TRAY) ×1
PACK EP LF (CUSTOM PROCEDURE TRAY) ×1 IMPLANT
PAD DEFIB LIFELINK (PAD) ×2 IMPLANT
SHEATH PINNACLE 6F 10CM (SHEATH) ×2 IMPLANT
SHEATH PINNACLE 7F 10CM (SHEATH) ×2 IMPLANT
SHEATH PINNACLE 8F 10CM (SHEATH) ×2 IMPLANT
SHIELD RADPAD SCOOP 12X17 (MISCELLANEOUS) ×2 IMPLANT

## 2016-10-08 NOTE — Interval H&P Note (Signed)
History and Physical Interval Note:  9/82/6415 8:30 PM  [name] [name]  has presented today for surgery, with the diagnosis of flutter  The various methods of treatment have been discussed with the patient and family. After consideration of risks, benefits and other options for treatment, the patient has consented to  Procedure(s): A-FLUTTER ABLATION (N/A) as a surgical intervention .  The patient's history has been reviewed, patient examined, no change in status, stable for surgery.  I have reviewed the patient's chart and labs.  Questions were answered to the patient's satisfaction.     [name] [name]

## 2016-10-08 NOTE — Telephone Encounter (Signed)
Spoke with [name] with [facility] and confirmed that Xopenex should be prn per 09/27/16 phone note.  Nothing further needed.

## 2016-10-08 NOTE — Telephone Encounter (Signed)
[name], [place], [place] is calling back.  States needing dosage and if on as needed basis.  Medication is albuterol. Ref #[id number]

## 2016-10-08 NOTE — Progress Notes (Signed)
Paged dr Alfredia Ferguson and renee usury regarding pt c/o blurred vision in left eye, pt doesn't have her prescriptions glasses here and was trying to read. layed back down in bed.  Grips strong and equal, face symmetrical. Vs 129 aflutter, bp 132/81 (96). Dr Alfredia Ferguson returned paged will order ct scan of head. Will continue to monitor.

## 2016-10-08 NOTE — Progress Notes (Signed)
Cardizem drip held for hypotension with BP 70/62.  Pt denies CP, SOB, palpitations, dizziness, syncope.  Dr. Myna Hidalgo notified.  HR currently maintaining 90's-100's.  MD notified staff to hold cardizem currently and resume if HR sustains greater than 120 as blood pressure allows.

## 2016-10-08 NOTE — Progress Notes (Signed)
PROGRESS NOTE    [name] [name]  OJJ:[id number] DOB: September 21, 1950 DOA: 10/06/2016 PCP: Biagio Borg, MD   Brief Narrative: [name] [name] is a 66 y.o. female with medical history significant for HTN, paroxysmal atrial fibrillation,/atrial flutter, morbid obesity, Chronic respiratory failure/ COPD on 4 L prn , depression, who presented to the ER with complaints of feeling poorly, associated with tachycardia, "feeling hot." Of note, the patient was recently hospitalized with similar symptoms, diagnosed with atrial fibrillation with RVR. The patient has had multiple cardioversions, including one on 09/15/2016, and was referred to cardiology in consideration for ablation procedure.On presentation, the patient was noted to have a heart rate goes between 130s 140s, but without any increasing her blood pressure. She is afebrile. She was not hypoxic. She was admitted for Atrial Flutter with RVR and is to undergo EP Study and Catheter ablation this Afternoon. Patient was complaining of blurred vision in Left eye but was reading without her prescription glasses; Will order a Head CT to evaluate but likely it is from her not having her reading glasses.   Assessment & Plan:   Active Problems:   Vitamin D deficiency   Severe obesity (BMI >= 40) (HCC)   Anxiety state   Depression   Essential hypertension   PAF (paroxysmal atrial fibrillation) (HCC)   Atrial flutter (HCC)   Chronic respiratory failure with hypoxia and hypercapnia (HCC)   PULMONARY EMBOLISM, HX OF   DEEP VENOUS THROMBOPHLEBITIS, HX OF   Preventative health care   Long term current use of anticoagulant therapy   Tremor due to multiple drugs   COPD exacerbation (HCC)   Atrial fibrillation with RVR (HCC)   Atrial flutter with rapid ventricular response (HCC)  Paroxysmal Atrial Flutter and Atrial Fibrillation with RVR on Anticoagulation -Has Hx of Atrial Fib/Flutter with RVR and appeared to be Atrial Fibrillation this AM -History of  cardioversion's and recently Cardioverted several days ago in Early Septemeber  -This may have been exacerbated by COPD exacerbation. She had a recent hospitalization for the same complaints, and was to undergo ablation as outpatient.   -At home on Cardizem and Rythmol -On presentation the patient was placed on Cardizem drip, as well as Lopressor as needed IV, with improvement of her rate and Rhythm.  -Initial EKG Wide QRS tachycardia Non-specific intra-ventricular conduction block.  -Admit to Stepdown Telemetry -Continue Cardizem drip for now and po Cardizem and IV Metoprolol and po Metoprolol  -Cardiology Dr. Percival Spanish evaluated and consulted EP and patient was to undergo DCCV today but will hold in anticipation for Ablation tomorrow to likely be Done by Dr. Lovena Le; Dr. Lovena Le evaluated and patient to undergo EP study and Catheter ablation today  -NPO at [facility] -Cardiology Fellow added po Lopressor at 12.5 mg po q6h and C/w Cardizem 360 mg po Daily -Repeat ECHO Still Pending  -C/w Propafenone 325 mg po BID -Continue to Follow Cardiology Recc's  Blurred Left Eye Vision -Nurse Paged stated that patient was complaining of blurred vision when reading handout for Ablation  -Patient normally wears reading glasses but did not bring them to the hospital  -Patient reading without Prescription most likely the etiology -Will order a Head CT w/o Contrast to r/o any Intracranial Abnormalities though it is unlikely   COPD, Chronic without Exacerbation  -CXR without active pulmonary disease  -Continue Xopenex prn, Albuterol when rate improves, and home Respiratory meds with Dulera   -C/w Mucinex DM -C/w Supplemental O2  Chronic Diastolic Heart Failure -Last echocardiogram 2013.  Weight 260 lbs Appears -Compensated despite Afib w RVR  -Continue Lasix 80 mg po BID  -C/w Daily weights, strict I/O -Patient is -1.2291 Liters and Weight is down 5 lbs since admission  Hypertension  Continue  Antihypertensive Medications with Metoprolol and Cardizem -C/w Home Furosemide at 80 mg po BID  Hyperlipidemia Continue home Atorvastatin 10 mg  History of PE/DVT,  -No acute issues.  -C/w Apixaban 5 mg po BID  Depression and Anxiety -Continue home Fluoxetine 40 mg po Daily  Hypothyroidism -TSH on 09/15/16 was 0.993; Will repeat given Atrial Flutter with RVR -Continue home Levothyroxine 25 mcg po Daily   GERD  -No acute symptoms -Continue PPI with Pantoprazole 40 mg po Daily  Hypokalemia, improved -Patient's K+ was 3.6 this AM -Replete with po KCl 20 mEQ BID -Checked Mag Level and was 1.9 -Continue to Monitor and Replete as Necessary -Repeat CMP and Mag Level in AM   DVT prophylaxis: Anticoagulated with Apixaban Code Status: FULL CODE Family Communication: Discussed with daughter at bedside Disposition Plan: Remain Inpatient for Ablation  Consultants:   Cardiology  Electrophysiology  Procedures:  Possible Ablation 10/08/16   Antimicrobials:  Anti-infectives    None     Subjective: Seen and examined this AM and denied CP but states she has CP sometimes when she exerts herself. No Lightheadedness or dizziness. Nursing paged and stated she was having blurred vision but patient didn't have her glasses when she was reading. No other complaints or concerns.   Objective: Vitals:   10/08/16 0715 10/08/16 0730 10/08/16 0745 10/08/16 0800  BP: 106/81 96/82 106/68 106/71  Pulse: (!) 56 (!) 107 (!) 124 (!) 125  Resp: 13 13 14 18   Temp:   98 F (36.7 C)   TempSrc:   Oral   SpO2:   99%   Weight:      Height:        Intake/Output Summary (Last 24 hours) at 10/08/16 1143 Last data filed at 10/08/16 6222  Gross per 24 hour  Intake           410.25 ml  Output              800 ml  Net          -389.75 ml   Filed Weights   10/06/16 1900 10/07/16 0516 10/08/16 0645  Weight: 116.2 kg (256 lb 2.8 oz) 116.8 kg (257 lb 8 oz) 116.1 kg (255 lb 15.3 oz)    Examination: Physical Exam:  Constitutional: WN/WD obese Caucasian female in NAD appears calm and comfortable  Eyes:  Sclerae anicteric; Conjunctivae non-injected ENMT: External Ears and nose appear normal. Neck: Supple with no JVD Respiratory: Diminished but no appreciable wheezing/rales/rhonchi. Patient was not using accessory muscles to breathe Cardiovascular: Irregularly Irregular. No m/r/g. No edema Abdomen: Soft, NT, Distended due to body habitus. Bowel Sounds present GU: Deferred Musculoskeletal: No contractures; No cyanosis Skin: Warm and Dry. No rashes or lesions on a limited skin eval Neurologic: CN 2-12 grossly intact. No appreciable focal deficits  Psychiatric: Pleasant mood and affect; Intact judgement and insight  Data Reviewed: I have personally reviewed following labs and imaging studies  CBC:  Recent Labs Lab 10/06/16 1303 10/07/16 0314 10/08/16 0302  WBC 9.5 8.1 7.8  NEUTROABS  --   --  4.5  HGB 12.3 11.0* 10.8*  HCT 37.5 34.6* 32.9*  MCV 91.2 92.3 92.2  PLT 244 199 979   Basic Metabolic Panel:  Recent Labs Lab 10/06/16 1303 10/07/16  4627 10/07/16 0835 10/08/16 0302  NA 141 143  --  140  K 3.7 3.2*  --  3.6  CL 101 102  --  101  CO2 32 33*  --  32  GLUCOSE 128* 156*  --  164*  BUN 11 7  --  11  CREATININE 0.85 0.64  --  0.75  CALCIUM 8.9 8.1*  --  8.4*  MG  --   --  1.8 1.9  PHOS  --   --   --  4.2   GFR: Estimated Creatinine Clearance: 83.5 mL/min (by C-G formula based on SCr of 0.75 mg/dL). Liver Function Tests:  Recent Labs Lab 10/08/16 0302  AST 13*  ALT 12*  ALKPHOS 68  BILITOT 0.9  PROT 5.5*  ALBUMIN 3.1*   No results for input(s): LIPASE, AMYLASE in the last 168 hours. No results for input(s): AMMONIA in the last 168 hours. Coagulation Profile:  Recent Labs Lab 10/07/16 0314  INR 1.15   Cardiac Enzymes:  Recent Labs Lab 10/06/16 1727 10/06/16 2001 10/06/16 2229  TROPONINI <0.03 <0.03 <0.03   BNP (last 3  results) No results for input(s): PROBNP in the last 8760 hours. HbA1C: No results for input(s): HGBA1C in the last 72 hours. CBG: No results for input(s): GLUCAP in the last 168 hours. Lipid Profile: No results for input(s): CHOL, HDL, LDLCALC, TRIG, CHOLHDL, LDLDIRECT in the last 72 hours. Thyroid Function Tests: No results for input(s): TSH, T4TOTAL, FREET4, T3FREE, THYROIDAB in the last 72 hours. Anemia Panel: No results for input(s): VITAMINB12, FOLATE, FERRITIN, TIBC, IRON, RETICCTPCT in the last 72 hours. Sepsis Labs: No results for input(s): PROCALCITON, LATICACIDVEN in the last 168 hours.  Recent Results (from the past 240 hour(s))  MRSA PCR Screening     Status: None   Collection Time: 10/06/16  7:48 PM  Result Value Ref Range Status   MRSA by PCR NEGATIVE NEGATIVE Final    Comment:        The GeneXpert MRSA Assay (FDA approved for NASAL specimens only), is one component of a comprehensive MRSA colonization surveillance program. It is not intended to diagnose MRSA infection nor to guide or monitor treatment for MRSA infections.     Radiology Studies: Dg Chest 2 View  Result Date: 10/07/2016 CLINICAL DATA:  Four days of shortness of breath. History of atrial fibrillation, chronic respiratory failure, previous pulmonary embolism, former smoker, COPD. EXAM: CHEST  2 VIEW COMPARISON:  Portal chest x-ray of October 06, 2016 FINDINGS: Today's frontal view is obtained in a lordotic fashion. The lungs remain well-expanded. The interstitial markings are mildly prominent. The cardiac silhouette remains enlarged. The central pulmonary vascularity is prominent. There is no pleural effusion or pneumothorax. There is no discrete infiltrate. IMPRESSION: COPD with superimposed low-grade CHF. There is no alveolar pneumonia nor edema. There is no pleural effusion. Electronically Signed   By: David  Martinique M.D.   On: 10/07/2016 08:33   Dg Chest Portable 1 View  Result Date:  10/06/2016 CLINICAL DATA:  A true for ablation. EXAM: PORTABLE CHEST 1 VIEW COMPARISON:  September 14, 2016 FINDINGS: The heart size and mediastinal contours are stable. The heart size is enlarged. There is no focal infiltrate, pulmonary edema, or pleural effusion. The visualized skeletal structures are stable. IMPRESSION: No active cardiopulmonary disease. Electronically Signed   By: Abelardo Diesel M.D.   On: 10/06/2016 13:38   Scheduled Meds: . apixaban  5 mg Oral BID  . atorvastatin  10 mg Oral  Daily  . calcium-vitamin D  1 tablet Oral BID  . diltiazem  360 mg Oral Daily  . ferrous sulfate  325 mg Oral BID WC  . FLUoxetine  40 mg Oral Daily  . furosemide  80 mg Oral BID  . Influenza vac split quadrivalent PF  0.5 mL Intramuscular Tomorrow-1000  . levalbuterol  1.25 mg Nebulization QID  . levothyroxine  25 mcg Oral QAC breakfast  . mouth rinse  15 mL Mouth Rinse BID  . metoprolol tartrate  12.5 mg Oral Q6H  . mometasone-formoterol  2 puff Inhalation BID  . olopatadine  1 drop Both Eyes BID  . pantoprazole  40 mg Oral Daily  . potassium chloride SA  20 mEq Oral BID  . propafenone  325 mg Oral BID   Continuous Infusions: . diltiazem (CARDIZEM) infusion Stopped (10/08/16 0648)    LOS: 1 day   Kerney Elbe, DO [facility] Pager [phone]  If 7PM-7AM, please contact night-coverage [email or web address] Password [facility] 10/08/2016, 11:43 AM

## 2016-10-08 NOTE — Progress Notes (Signed)
Patient Name: [name] [name] Date of Encounter: 10/08/2016     Active Problems:   Vitamin D deficiency   Severe obesity (BMI >= 40) (HCC)   Anxiety state   Depression   Essential hypertension   PAF (paroxysmal atrial fibrillation) (HCC)   Atrial flutter (HCC)   Chronic respiratory failure with hypoxia and hypercapnia (HCC)   PULMONARY EMBOLISM, HX OF   DEEP VENOUS THROMBOPHLEBITIS, HX OF   Preventative health care   Long term current use of anticoagulant therapy   Tremor due to multiple drugs   COPD exacerbation (HCC)   Atrial fibrillation with RVR (HCC)   Atrial flutter with rapid ventricular response (HCC)    SUBJECTIVE  Stable over night with no worsening sob. No chest pain.   CURRENT MEDS . apixaban  5 mg Oral BID  . atorvastatin  10 mg Oral Daily  . calcium-vitamin D  1 tablet Oral BID  . diltiazem  360 mg Oral Daily  . ferrous sulfate  325 mg Oral BID WC  . FLUoxetine  40 mg Oral Daily  . furosemide  80 mg Oral BID  . Influenza vac split quadrivalent PF  0.5 mL Intramuscular Tomorrow-1000  . levalbuterol  1.25 mg Nebulization QID  . levothyroxine  25 mcg Oral QAC breakfast  . mouth rinse  15 mL Mouth Rinse BID  . metoprolol tartrate  12.5 mg Oral Q6H  . mometasone-formoterol  2 puff Inhalation BID  . off the beat book   Does not apply Once  . olopatadine  1 drop Both Eyes BID  . pantoprazole  40 mg Oral Daily  . potassium chloride SA  20 mEq Oral BID  . propafenone  325 mg Oral BID    OBJECTIVE  Vitals:   10/08/16 0715 10/08/16 0730 10/08/16 0745 10/08/16 0800  BP: 106/81 96/82 106/68 106/71  Pulse: (!) 56 (!) 107 (!) 124 (!) 125  Resp: 13 13 14 18   Temp:   98 F (36.7 C)   TempSrc:   Oral   SpO2:   99%   Weight:      Height:        Intake/Output Summary (Last 24 hours) at 10/08/16 0810 Last data filed at 10/08/16 0648  Gross per 24 hour  Intake           410.25 ml  Output              900 ml  Net          -489.75 ml   Filed Weights   10/06/16 1900 10/07/16 0516 10/08/16 0645  Weight: 256 lb 2.8 oz (116.2 kg) 257 lb 8 oz (116.8 kg) 255 lb 15.3 oz (116.1 kg)    PHYSICAL EXAM  General: Pleasant, NAD. Neuro: Alert and oriented X 3. Moves all extremities spontaneously. Psych: Normal affect. HEENT:  Normal  Neck: Supple without bruits or JVD. Lungs:  Resp regular and unlabored, CTA. Heart: RRR no s3, s4, or murmurs. Abdomen: Soft, non-tender, non-distended, BS + x 4.  Extremities: No clubbing, cyanosis or edema. DP/PT/Radials 2+ and equal bilaterally.  Accessory Clinical Findings  CBC  Recent Labs  10/07/16 0314 10/08/16 0302  WBC 8.1 7.8  NEUTROABS  --  4.5  HGB 11.0* 10.8*  HCT 34.6* 32.9*  MCV 92.3 92.2  PLT 199 102   Basic Metabolic Panel  Recent Labs  10/07/16 0314 10/07/16 0835 10/08/16 0302  NA 143  --  140  K 3.2*  --  3.6  CL 102  --  101  CO2 33*  --  32  GLUCOSE 156*  --  164*  BUN 7  --  11  CREATININE 0.64  --  0.75  CALCIUM 8.1*  --  8.4*  MG  --  1.8 1.9  PHOS  --   --  4.2   Liver Function Tests  Recent Labs  10/08/16 0302  AST 13*  ALT 12*  ALKPHOS 68  BILITOT 0.9  PROT 5.5*  ALBUMIN 3.1*   No results for input(s): LIPASE, AMYLASE in the last 72 hours. Cardiac Enzymes  Recent Labs  10/06/16 1727 10/06/16 2001 10/06/16 2229  TROPONINI <0.03 <0.03 <0.03   BNP Invalid input(s): POCBNP D-Dimer No results for input(s): DDIMER in the last 72 hours. Hemoglobin A1C No results for input(s): HGBA1C in the last 72 hours. Fasting Lipid Panel No results for input(s): CHOL, HDL, LDLCALC, TRIG, CHOLHDL, LDLDIRECT in the last 72 hours. Thyroid Function Tests No results for input(s): TSH, T4TOTAL, T3FREE, THYROIDAB in the last 72 hours.  Invalid input(s): FREET3  TELE  Atrial flutter with RVR  ECG  Typical appearing atrial flutter  Radiology/Studies  Dg Chest 2 View  Result Date: 10/07/2016 CLINICAL DATA:  Four days of shortness of breath. History of atrial  fibrillation, chronic respiratory failure, previous pulmonary embolism, former smoker, COPD. EXAM: CHEST  2 VIEW COMPARISON:  Portal chest x-ray of October 06, 2016 FINDINGS: Today's frontal view is obtained in a lordotic fashion. The lungs remain well-expanded. The interstitial markings are mildly prominent. The cardiac silhouette remains enlarged. The central pulmonary vascularity is prominent. There is no pleural effusion or pneumothorax. There is no discrete infiltrate. IMPRESSION: COPD with superimposed low-grade CHF. There is no alveolar pneumonia nor edema. There is no pleural effusion. Electronically Signed   By: David  Martinique M.D.   On: 10/07/2016 08:33   Dg Chest 2 View  Result Date: 09/14/2016 CLINICAL DATA:  Productive cough and worsening shortness of breath. EXAM: CHEST  2 VIEW COMPARISON:  08/03/2016 FINDINGS: The cardio pericardial silhouette is enlarged. The lungs are clear without focal pneumonia, edema, pneumothorax or pleural effusion. Interstitial markings are diffusely coarsened with chronic features. The visualized bony structures of the thorax are intact. IMPRESSION: Stable exam. Chronic interstitial coarsening without acute cardiopulmonary findings. Electronically Signed   By: Misty Stanley M.D.   On: 09/14/2016 13:20   Dg Chest Portable 1 View  Result Date: 10/06/2016 CLINICAL DATA:  A true for ablation. EXAM: PORTABLE CHEST 1 VIEW COMPARISON:  September 14, 2016 FINDINGS: The heart size and mediastinal contours are stable. The heart size is enlarged. There is no focal infiltrate, pulmonary edema, or pleural effusion. The visualized skeletal structures are stable. IMPRESSION: No active cardiopulmonary disease. Electronically Signed   By: Abelardo Diesel M.D.   On: 10/06/2016 13:38    ASSESSMENT AND PLAN  1. Recurrent atrial flutter after catheter ablation 8 years ago - I have recommended proceeding with EP study and catheter ablation. She wishes to proceed.  2. Obesity - this is  affecting her health significantly. I wonder about sleep apnea. I have recommended this be evaluated after her ablation as an outpatient. 3. HTN - her blood pressure is well controlled Will follow.   Skyy Nilan,M.D.  Carleene Overlie Dantre Yearwood,M.D.  10/08/2016 8:18 AMPatient ID: [name] [name], female   DOB: 03/19/1950, 66 y.o.   MRN: [id number]

## 2016-10-08 NOTE — H&P (View-Only) (Signed)
Patient Name: [name] [name] Date of Encounter: 10/08/2016     Active Problems:   Vitamin D deficiency   Severe obesity (BMI >= 40) (HCC)   Anxiety state   Depression   Essential hypertension   PAF (paroxysmal atrial fibrillation) (HCC)   Atrial flutter (HCC)   Chronic respiratory failure with hypoxia and hypercapnia (HCC)   PULMONARY EMBOLISM, HX OF   DEEP VENOUS THROMBOPHLEBITIS, HX OF   Preventative health care   Long term current use of anticoagulant therapy   Tremor due to multiple drugs   COPD exacerbation (HCC)   Atrial fibrillation with RVR (HCC)   Atrial flutter with rapid ventricular response (HCC)    SUBJECTIVE  Stable over night with no worsening sob. No chest pain.   CURRENT MEDS . apixaban  5 mg Oral BID  . atorvastatin  10 mg Oral Daily  . calcium-vitamin D  1 tablet Oral BID  . diltiazem  360 mg Oral Daily  . ferrous sulfate  325 mg Oral BID WC  . FLUoxetine  40 mg Oral Daily  . furosemide  80 mg Oral BID  . Influenza vac split quadrivalent PF  0.5 mL Intramuscular Tomorrow-1000  . levalbuterol  1.25 mg Nebulization QID  . levothyroxine  25 mcg Oral QAC breakfast  . mouth rinse  15 mL Mouth Rinse BID  . metoprolol tartrate  12.5 mg Oral Q6H  . mometasone-formoterol  2 puff Inhalation BID  . off the beat book   Does not apply Once  . olopatadine  1 drop Both Eyes BID  . pantoprazole  40 mg Oral Daily  . potassium chloride SA  20 mEq Oral BID  . propafenone  325 mg Oral BID    OBJECTIVE  Vitals:   10/08/16 0715 10/08/16 0730 10/08/16 0745 10/08/16 0800  BP: 106/81 96/82 106/68 106/71  Pulse: (!) 56 (!) 107 (!) 124 (!) 125  Resp: 13 13 14 18   Temp:   98 F (36.7 C)   TempSrc:   Oral   SpO2:   99%   Weight:      Height:        Intake/Output Summary (Last 24 hours) at 10/08/16 0810 Last data filed at 10/08/16 0648  Gross per 24 hour  Intake           410.25 ml  Output              900 ml  Net          -489.75 ml   Filed Weights   10/06/16 1900 10/07/16 0516 10/08/16 0645  Weight: 256 lb 2.8 oz (116.2 kg) 257 lb 8 oz (116.8 kg) 255 lb 15.3 oz (116.1 kg)    PHYSICAL EXAM  General: Pleasant, NAD. Neuro: Alert and oriented X 3. Moves all extremities spontaneously. Psych: Normal affect. HEENT:  Normal  Neck: Supple without bruits or JVD. Lungs:  Resp regular and unlabored, CTA. Heart: RRR no s3, s4, or murmurs. Abdomen: Soft, non-tender, non-distended, BS + x 4.  Extremities: No clubbing, cyanosis or edema. DP/PT/Radials 2+ and equal bilaterally.  Accessory Clinical Findings  CBC  Recent Labs  10/07/16 0314 10/08/16 0302  WBC 8.1 7.8  NEUTROABS  --  4.5  HGB 11.0* 10.8*  HCT 34.6* 32.9*  MCV 92.3 92.2  PLT 199 562   Basic Metabolic Panel  Recent Labs  10/07/16 0314 10/07/16 0835 10/08/16 0302  NA 143  --  140  K 3.2*  --  3.6  CL 102  --  101  CO2 33*  --  32  GLUCOSE 156*  --  164*  BUN 7  --  11  CREATININE 0.64  --  0.75  CALCIUM 8.1*  --  8.4*  MG  --  1.8 1.9  PHOS  --   --  4.2   Liver Function Tests  Recent Labs  10/08/16 0302  AST 13*  ALT 12*  ALKPHOS 68  BILITOT 0.9  PROT 5.5*  ALBUMIN 3.1*   No results for input(s): LIPASE, AMYLASE in the last 72 hours. Cardiac Enzymes  Recent Labs  10/06/16 1727 10/06/16 2001 10/06/16 2229  TROPONINI <0.03 <0.03 <0.03   BNP Invalid input(s): POCBNP D-Dimer No results for input(s): DDIMER in the last 72 hours. Hemoglobin A1C No results for input(s): HGBA1C in the last 72 hours. Fasting Lipid Panel No results for input(s): CHOL, HDL, LDLCALC, TRIG, CHOLHDL, LDLDIRECT in the last 72 hours. Thyroid Function Tests No results for input(s): TSH, T4TOTAL, T3FREE, THYROIDAB in the last 72 hours.  Invalid input(s): FREET3  TELE  Atrial flutter with RVR  ECG  Typical appearing atrial flutter  Radiology/Studies  Dg Chest 2 View  Result Date: 10/07/2016 CLINICAL DATA:  Four days of shortness of breath. History of atrial  fibrillation, chronic respiratory failure, previous pulmonary embolism, former smoker, COPD. EXAM: CHEST  2 VIEW COMPARISON:  Portal chest x-ray of October 06, 2016 FINDINGS: Today's frontal view is obtained in a lordotic fashion. The lungs remain well-expanded. The interstitial markings are mildly prominent. The cardiac silhouette remains enlarged. The central pulmonary vascularity is prominent. There is no pleural effusion or pneumothorax. There is no discrete infiltrate. IMPRESSION: COPD with superimposed low-grade CHF. There is no alveolar pneumonia nor edema. There is no pleural effusion. Electronically Signed   By: David  Martinique M.D.   On: 10/07/2016 08:33   Dg Chest 2 View  Result Date: 09/14/2016 CLINICAL DATA:  Productive cough and worsening shortness of breath. EXAM: CHEST  2 VIEW COMPARISON:  08/03/2016 FINDINGS: The cardio pericardial silhouette is enlarged. The lungs are clear without focal pneumonia, edema, pneumothorax or pleural effusion. Interstitial markings are diffusely coarsened with chronic features. The visualized bony structures of the thorax are intact. IMPRESSION: Stable exam. Chronic interstitial coarsening without acute cardiopulmonary findings. Electronically Signed   By: Misty Stanley M.D.   On: 09/14/2016 13:20   Dg Chest Portable 1 View  Result Date: 10/06/2016 CLINICAL DATA:  A true for ablation. EXAM: PORTABLE CHEST 1 VIEW COMPARISON:  September 14, 2016 FINDINGS: The heart size and mediastinal contours are stable. The heart size is enlarged. There is no focal infiltrate, pulmonary edema, or pleural effusion. The visualized skeletal structures are stable. IMPRESSION: No active cardiopulmonary disease. Electronically Signed   By: Abelardo Diesel M.D.   On: 10/06/2016 13:38    ASSESSMENT AND PLAN  1. Recurrent atrial flutter after catheter ablation 8 years ago - I have recommended proceeding with EP study and catheter ablation. She wishes to proceed.  2. Obesity - this is  affecting her health significantly. I wonder about sleep apnea. I have recommended this be evaluated after her ablation as an outpatient. 3. HTN - her blood pressure is well controlled Will follow.   [name],M.D.  Carleene Overlie Taylor,M.D.  10/08/2016 2:95 AMPatient ID: [name] [name], female   DOB: November 02, 1950, 66 y.o.   MRN: [id number]

## 2016-10-09 ENCOUNTER — Other Ambulatory Visit (HOSPITAL_COMMUNITY): Payer: Medicare Other

## 2016-10-09 DIAGNOSIS — E876 Hypokalemia: Secondary | ICD-10-CM

## 2016-10-09 DIAGNOSIS — Z0000 Encounter for general adult medical examination without abnormal findings: Secondary | ICD-10-CM

## 2016-10-09 DIAGNOSIS — Z8672 Personal history of thrombophlebitis: Secondary | ICD-10-CM

## 2016-10-09 LAB — PHOSPHORUS: Phosphorus: 3.4 mg/dL (ref 2.5–4.6)

## 2016-10-09 LAB — COMPREHENSIVE METABOLIC PANEL
ALT: 15 U/L (ref 14–54)
AST: 21 U/L (ref 15–41)
Albumin: 3.4 g/dL — ABNORMAL LOW (ref 3.5–5.0)
Alkaline Phosphatase: 71 U/L (ref 38–126)
Anion gap: 10 (ref 5–15)
BUN: 12 mg/dL (ref 6–20)
CO2: 28 mmol/L (ref 22–32)
Calcium: 8.8 mg/dL — ABNORMAL LOW (ref 8.9–10.3)
Chloride: 100 mmol/L — ABNORMAL LOW (ref 101–111)
Creatinine, Ser: 0.71 mg/dL (ref 0.44–1.00)
GFR calc Af Amer: >60 mL/min (ref >60)
GFR calc non Af Amer: >60 mL/min (ref >60)
Glucose, Bld: 203 mg/dL — ABNORMAL HIGH (ref 65–99)
Potassium: 3.9 mmol/L (ref 3.5–5.1)
Sodium: 138 mmol/L (ref 135–145)
Total Bilirubin: 1.1 mg/dL (ref 0.3–1.2)
Total Protein: 6.1 g/dL — ABNORMAL LOW (ref 6.5–8.1)

## 2016-10-09 LAB — CBC WITH DIFFERENTIAL/PLATELET
Basophils Absolute: 0 10*3/uL (ref 0.0–0.1)
Basophils Relative: 0 %
Eosinophils Absolute: 0.1 10*3/uL (ref 0.0–0.7)
Eosinophils Relative: 1 %
HCT: 32.9 % — ABNORMAL LOW (ref 36.0–46.0)
Hemoglobin: 10.6 g/dL — ABNORMAL LOW (ref 12.0–15.0)
Lymphocytes Relative: 21 %
Lymphs Abs: 2.1 10*3/uL (ref 0.7–4.0)
MCH: 30.1 pg (ref 26.0–34.0)
MCHC: 32.2 g/dL (ref 30.0–36.0)
MCV: 93.5 fL (ref 78.0–100.0)
Monocytes Absolute: 0.7 10*3/uL (ref 0.1–1.0)
Monocytes Relative: 6 %
Neutro Abs: 7.5 10*3/uL (ref 1.7–7.7)
Neutrophils Relative %: 72 %
Platelets: 226 10*3/uL (ref 150–400)
RBC: 3.52 MIL/uL — ABNORMAL LOW (ref 3.87–5.11)
RDW: 16.6 % — ABNORMAL HIGH (ref 11.5–15.5)
WBC: 10.4 10*3/uL (ref 4.0–10.5)

## 2016-10-09 LAB — MAGNESIUM: Magnesium: 1.9 mg/dL (ref 1.7–2.4)

## 2016-10-09 MED ORDER — METOPROLOL TARTRATE 25 MG PO TABS: 25.0000 mg | ORAL_TABLET | Freq: Two times a day (BID) | ORAL | 0 refills | Status: DC

## 2016-10-09 MED ORDER — SENNOSIDES-DOCUSATE SODIUM 8.6-50 MG PO TABS: 1.0000 | ORAL_TABLET | Freq: Every evening | ORAL | 0 refills | Status: DC | PRN

## 2016-10-09 NOTE — Progress Notes (Signed)
Patient Name: [name] [name] Date of Encounter: 10/09/2016     Active Problems:   Vitamin D deficiency   Severe obesity (BMI >= 40) (HCC)   Anxiety state   Depression   Essential hypertension   PAF (paroxysmal atrial fibrillation) (HCC)   Atrial flutter (HCC)   Chronic respiratory failure with hypoxia and hypercapnia (HCC)   PULMONARY EMBOLISM, HX OF   DEEP VENOUS THROMBOPHLEBITIS, HX OF   Preventative health care   Long term current use of anticoagulant therapy   Tremor due to multiple drugs   COPD exacerbation (HCC)   Atrial fibrillation with RVR (HCC)   Atrial flutter with rapid ventricular response (HCC)   Blurred vision, left eye   Hypokalemia    SUBJECTIVE  Feeling well without CP or SOB.   CURRENT MEDS . apixaban  5 mg Oral BID  . atorvastatin  10 mg Oral Daily  . calcium-vitamin D  1 tablet Oral BID  . diltiazem  360 mg Oral Daily  . ferrous sulfate  325 mg Oral BID WC  . FLUoxetine  40 mg Oral Daily  . furosemide  80 mg Oral BID  . Influenza vac split quadrivalent PF  0.5 mL Intramuscular Tomorrow-1000  . levalbuterol  1.25 mg Nebulization QID  . levothyroxine  25 mcg Oral QAC breakfast  . mouth rinse  15 mL Mouth Rinse BID  . metoprolol tartrate  12.5 mg Oral Q6H  . mometasone-formoterol  2 puff Inhalation BID  . olopatadine  1 drop Both Eyes BID  . pantoprazole  40 mg Oral Daily  . potassium chloride SA  20 mEq Oral BID  . propafenone  325 mg Oral BID    OBJECTIVE  Vitals:   10/09/16 0319 10/09/16 0616 10/09/16 0808 10/09/16 0845  BP: 124/67 120/69    Pulse: 73 82    Resp: 15     Temp: 98 F (36.7 C)  97.8 F (36.6 C)   TempSrc: Oral  Oral   SpO2: 99%   98%  Weight: 258 lb 9.6 oz (117.3 kg)     Height:        Intake/Output Summary (Last 24 hours) at 10/09/16 0957 Last data filed at 10/09/16 0319  Gross per 24 hour  Intake              240 ml  Output              925 ml  Net             -685 ml   Filed Weights   10/07/16 0516  10/08/16 0645 10/09/16 0319  Weight: 257 lb 8 oz (116.8 kg) 255 lb 15.3 oz (116.1 kg) 258 lb 9.6 oz (117.3 kg)    PHYSICAL EXAM  GEN: Well nourished, well developed, in no acute distress  HEENT: normal  Neck: no JVD, carotid bruits, or masses Cardiac: RRR; no murmurs, rubs, or gallops,no edema  Respiratory:  clear to auscultation bilaterally, normal work of breathing GI: soft, nontender, nondistended, + BS MS: no deformity or atrophy  Skin: warm and dry Neuro:  Strength and sensation are intact Psych: euthymic mood, full affect   Accessory Clinical Findings  CBC  Recent Labs  10/07/16 0314 10/08/16 0302  WBC 8.1 7.8  NEUTROABS  --  4.5  HGB 11.0* 10.8*  HCT 34.6* 32.9*  MCV 92.3 92.2  PLT 199 026   Basic Metabolic Panel  Recent Labs  10/07/16 0314 10/07/16 0835 10/08/16 0302  NA 143  --  140  K 3.2*  --  3.6  CL 102  --  101  CO2 33*  --  32  GLUCOSE 156*  --  164*  BUN 7  --  11  CREATININE 0.64  --  0.75  CALCIUM 8.1*  --  8.4*  MG  --  1.8 1.9  PHOS  --   --  4.2   Liver Function Tests  Recent Labs  10/08/16 0302  AST 13*  ALT 12*  ALKPHOS 68  BILITOT 0.9  PROT 5.5*  ALBUMIN 3.1*   No results for input(s): LIPASE, AMYLASE in the last 72 hours. Cardiac Enzymes  Recent Labs  10/06/16 1727 10/06/16 2001 10/06/16 2229  TROPONINI <0.03 <0.03 <0.03   BNP Invalid input(s): POCBNP D-Dimer No results for input(s): DDIMER in the last 72 hours. Hemoglobin A1C No results for input(s): HGBA1C in the last 72 hours. Fasting Lipid Panel No results for input(s): CHOL, HDL, LDLCALC, TRIG, CHOLHDL, LDLDIRECT in the last 72 hours. Thyroid Function Tests No results for input(s): TSH, T4TOTAL, T3FREE, THYROIDAB in the last 72 hours.  Invalid input(s): FREET3  TELE  Sinus rhythm - personally reviewed  ECG  None new  Radiology/Studies  Dg Chest 2 View  Result Date: 10/07/2016 CLINICAL DATA:  Four days of shortness of breath. History of  atrial fibrillation, chronic respiratory failure, previous pulmonary embolism, former smoker, COPD. EXAM: CHEST  2 VIEW COMPARISON:  Portal chest x-ray of October 06, 2016 FINDINGS: Today's frontal view is obtained in a lordotic fashion. The lungs remain well-expanded. The interstitial markings are mildly prominent. The cardiac silhouette remains enlarged. The central pulmonary vascularity is prominent. There is no pleural effusion or pneumothorax. There is no discrete infiltrate. IMPRESSION: COPD with superimposed low-grade CHF. There is no alveolar pneumonia nor edema. There is no pleural effusion. Electronically Signed   By: David  Martinique M.D.   On: 10/07/2016 08:33   Dg Chest 2 View  Result Date: 09/14/2016 CLINICAL DATA:  Productive cough and worsening shortness of breath. EXAM: CHEST  2 VIEW COMPARISON:  08/03/2016 FINDINGS: The cardio pericardial silhouette is enlarged. The lungs are clear without focal pneumonia, edema, pneumothorax or pleural effusion. Interstitial markings are diffusely coarsened with chronic features. The visualized bony structures of the thorax are intact. IMPRESSION: Stable exam. Chronic interstitial coarsening without acute cardiopulmonary findings. Electronically Signed   By: Misty Stanley M.D.   On: 09/14/2016 13:20   Ct Head Wo Contrast  Result Date: 10/08/2016 CLINICAL DATA:  Subacute neuro deficits. Blurred vision and headache EXAM: CT HEAD WITHOUT CONTRAST TECHNIQUE: Contiguous axial images were obtained from the base of the skull through the vertex without intravenous contrast. COMPARISON:  CT head 04/08/2014 FINDINGS: Brain: Ventricle size normal. Negative for acute infarct. Negative for hemorrhage or mass. Mild chronic microvascular ischemic change in the white matter unchanged from the prior study. Vascular: Negative for hyperdense vessel Skull: Negative Sinuses/Orbits: Mild mucosal edema left maxillary sinus. Negative orbit. Cataract removal on the right Other: None  IMPRESSION: Mild chronic microvascular ischemic change in the white matter. No acute abnormality. Electronically Signed   By: Franchot Gallo M.D.   On: 10/08/2016 12:56   Dg Chest Portable 1 View  Result Date: 10/06/2016 CLINICAL DATA:  A true for ablation. EXAM: PORTABLE CHEST 1 VIEW COMPARISON:  September 14, 2016 FINDINGS: The heart size and mediastinal contours are stable. The heart size is enlarged. There is no focal infiltrate, pulmonary edema, or pleural effusion.  The visualized skeletal structures are stable. IMPRESSION: No active cardiopulmonary disease. Electronically Signed   By: Abelardo Diesel M.D.   On: 10/06/2016 13:38    ASSESSMENT AND PLAN  1. Recurrent atrial flutter after catheter ablation 8 years ago - s/p atrial flutter ablation. In sinus rhythm today. Continue anticoagulation with Eliquis. Continue Rhythmol, metoprolol.  2. Obesity - would recommend outpatient sleep study 3. HTN - Well controlled. No changes   Allegra Lai, MD 10/09/2016 5:46 AMPatient ID: [name] [name], female   DOB: 1950/10/02, 66 y.o.   MRN: [id number]

## 2016-10-09 NOTE — Progress Notes (Signed)
Pt discharge instructions provided with daughter at bedside-formed signed and copy provided.  Reviewed medications with patient in daughter in detail. Provided prescriptions. PIV removed.  Groin site C/D/I. No signs of infection at discharge.  Patient take to private vehicle with wheelchair and oxygen (o2 at discharge was 97 on 2.5L via [place]).  She was switched to her portable oxygen tank once she got into her vehicle.

## 2016-10-09 NOTE — Discharge Summary (Signed)
Physician Discharge Summary  [name] [name] BZJ:[id number] DOB: [birth year] DOA: 10/06/2016  PCP: [name] Borg, MD  Admit date: 10/06/2016 Discharge date: 10/09/2016  Admitted From: Home Disposition: Home  Recommendations for Outpatient Follow-up:  1. Follow up with PCP in 1-2 weeks 2. Follow up with Cardiology as an outpatient 3. Follow up with Pulmonary Dr. Melvyn Novas as an outpatient 4. Follow up for outpatient Sleep Study 5. Please obtain CMP/CBC, Mag, Phos in one week  Home Health: No  Equipment/Devices: None   Discharge Condition: Stable CODE STATUS: FULL CODE Diet recommendation: Heart Healthy Diet  Brief/Interim Summary: [name] Stoneman Nelsonis a 66 y.o.femalewith medical history significant for HTN, paroxysmal atrial fibrillation,/atrial flutter, morbid obesity, Chronic respiratory failure/ COPD on 4 L prn , depression, who presented to the ER with complaints of feeling poorly, associated with tachycardia, "feeling hot." Of note, the patient was recently hospitalized with similar symptoms, diagnosed with atrial fibrillation with RVR. The patient has had multiple cardioversions, including one on 09/15/2016, and was referred to cardiology in consideration for ablation procedure.   On presentation, the patient was noted to have a heart rate goes between 130s 140s, but without any increasing her blood pressure. She is afebrile. She was not hypoxic.During the hospitalization the patient was complaining of blurred vision in Left eye but was reading without her prescription glasses; Head CT was negative and patient is on Anticoagulation with Eliquis  Patient was admitted for Atrial Flutter with RVR and underwent EP Study and Catheter ablation with successful conversion back into Sinus Rhythm. Cardiology evaluated and recommended continuing AC, Rhythmol, and Metoprolol. She was deemed medically stable to D/C Home and will need to follow up with PCP, Cardiology, Pulmonology and will need to have an  outpatient Sleep Study.   Discharge Diagnoses:  Active Problems:   Vitamin D deficiency   Severe obesity (BMI >= 40) (HCC)   Anxiety state   Depression   Essential hypertension   PAF (paroxysmal atrial fibrillation) (HCC)   Atrial flutter (HCC)   Chronic respiratory failure with hypoxia and hypercapnia (HCC)   PULMONARY EMBOLISM, HX OF   DEEP VENOUS THROMBOPHLEBITIS, HX OF   Preventative health care   Long term current use of anticoagulant therapy   Tremor due to multiple drugs   COPD exacerbation (HCC)   Atrial fibrillation with RVR (HCC)   Atrial flutter with rapid ventricular response (HCC)   Blurred vision, left eye   Hypokalemia  Paroxysmal Atrial Flutter with RVR on Anticoagulation s/p A Flutter Ablation now in NSR -Has Hx of Atrial Fib/Flutter with RVR and appeared to be Atrial Fibrillation yesterday AM -History of cardioversion's and recently Cardioverted several days ago in Early Septemeber  -This may have been exacerbated by COPD exacerbation.She had a recent hospitalization for the same complaints, and was to undergo ablation as outpatient.  -At home on Cardizem and Rythmol but per Cardiology D/C Cardizem and start Metoprolol  -On presentation the patient was placed on Cardizem drip, as well as Lopressor as needed IV, with some improvement of her rate and Rhythm.  -Initial EKG Wide QRS tachycardia Non-specific intra-ventricular conduction block.  -Admitted to [facility] -Cardiology Dr. Percival Spanish evaluated and consulted EP and patient was to undergo DCCV instead went for Ablation; Dr. Lovena Le evaluated and patient to underwent EP study and Catheter ablation 10/08/16 -Repeat ECHO as an outpatient  -C/w Propafenone 325 mg po BID and Metoprolol 25 mg po BID per Cardiology Recc's -Appreciated Cardiology following and follow up as an outpatient  at next scheduled appointment.  -Have Sleep Study done as an outpatient   [place] -Nurse Paged stated that  patient was complaining of blurred vision when reading handout for Ablation yesterday -Patient normally wears reading glasses but did not bring them to the hospital  -Patient reading without Prescription most likely the etiology -Head CT showed Mild chronic microvascular ischemic change in the white matter. No acute abnormality. -Blurred vision improved some   COPD, Chronic without Exacerbation -CXR without active pulmonary disease  -Continue Home Meds -C/w Mucinex DM -C/w Supplemental O2 -Follow up with Pulmonology as an outpatient   Chronic Diastolic Heart Failure -Last echocardiogram 2013. Weight 260 lbs Appears -Compensated -Continue Lasix 80 mg po BID  -C/w Daily weights, strict I/O -Patient is -1.6741 Liters and Weight is down 2 lbs since admission -Follow up with Cardiology as an outpatient   Hypertension  -Continue Antihypertensive Medications with Metoprolol and stopped Cardizem -C/w Home Furosemide at 80 mg po BID  Hyperlipidemia Continue home Atorvastatin 10 mg  History of PE/DVT,  -No acute issues.  -C/w Apixaban 5 mg po BID  Depression and Anxiety -Continue home Fluoxetine 40 mg po Daily  Hypothyroidism -TSH on 09/15/16 was 0.993;  -Continue home Levothyroxine 25 mcg po Daily   GERD -No acute symptoms -Continue PPI with Pantoprazole 40 mg po Daily  Hypokalemia, improved -Patient's K+ was 3.9 this AM -Continue to Monitor and Replete as Necessary -Repeat CMP and Mag Level as an out patient  Discharge Instructions  Allergies as of 10/09/2016      Reactions   Duloxetine Other (See Comments)   REACTION: rhabdomyolysis   Penicillins Other (See Comments)   SYNCOPE Has patient had a PCN reaction causing immediate rash, facial/tongue/throat swelling, SOB or lightheadedness with hypotension: Yes Has patient had a PCN reaction causing severe rash involving mucus membranes or skin necrosis: No Has patient had a PCN reaction that required  hospitalization No Has patient had a PCN reaction occurring within the last 10 years: No If all of the above answers are "NO", then may proceed with Cephalosporin use.   Latex Rash      Medication List    STOP taking these medications   diltiazem 360 MG 24 hr capsule Commonly known as:  CARDIZEM CD   doxycycline 100 MG tablet Commonly known as:  VIBRA-TABS   predniSONE 10 MG tablet Commonly known as:  DELTASONE     TAKE these medications   acetaminophen 650 MG CR tablet Commonly known as:  TYLENOL Take 650-1,300 mg by mouth as needed for pain. Per bottle as needed   albuterol 108 (90 Base) MCG/ACT inhaler Commonly known as:  PROAIR HFA Inhale 2 puffs into the lungs every 6 (six) hours as needed for wheezing or shortness of breath.   atorvastatin 10 MG tablet Commonly known as:  LIPITOR Take 1 tablet (10 mg total) by mouth daily.   budesonide-formoterol 160-4.5 MCG/ACT inhaler Commonly known as:  SYMBICORT Inhale 2 puffs into the lungs 2 (two) times daily.   Calcium Carbonate-Vitamin D 600-400 MG-UNIT tablet Take 1 tablet by mouth 2 (two) times daily.   ELIQUIS 5 MG Tabs tablet Generic drug:  apixaban TAKE 1 TABLET TWICE A DAY   ferrous sulfate 325 (65 FE) MG tablet Take 325 mg by mouth 2 (two) times daily with a meal.   FLUoxetine 40 MG capsule Commonly known as:  PROZAC TAKE 1 CAPSULE DAILY   fluticasone 50 MCG/ACT nasal spray Commonly known as:  FLONASE Place 2 sprays into both nostrils 2 (two) times daily as needed for allergies or rhinitis.   furosemide 80 MG tablet Commonly known as:  LASIX Take 1 tablet (80 mg total) by mouth 2 (two) times daily.   glucosamine-chondroitin 500-400 MG tablet Take 1 tablet by mouth every morning.   KLOR-CON M20 20 MEQ tablet Generic drug:  potassium chloride SA Take 1 tablet (20 mEq total) by mouth 2 (two) times daily. Please schedule appointment for refills.   levalbuterol 1.25 MG/3ML nebulizer solution Commonly  known as:  XOPENEX Take 1.25 mg by nebulization every 4 (four) hours.   levothyroxine 25 MCG tablet Commonly known as:  SYNTHROID, LEVOTHROID TAKE 1 TABLET DAILY BEFORE BREAKFAST   metoprolol tartrate 25 MG tablet Commonly known as:  LOPRESSOR Take 1 tablet (25 mg total) by mouth 2 (two) times daily.   MUCINEX DM MAXIMUM STRENGTH 60-1200 MG Tb12 Take 1 tablet by mouth every 12 (twelve) hours as needed (cough/congestion). With flutter   omeprazole 20 MG capsule Commonly known as:  PRILOSEC Take 20mg s before supper daily   OXYGEN Inhale 2.5 L/min into the lungs continuous.   PATADAY 0.2 % Soln Generic drug:  Olopatadine HCl Place 1 drop into both eyes daily.   propafenone 325 MG 12 hr capsule Commonly known as:  RYTHMOL SR Take 1 capsule (325 mg total) by mouth 2 (two) times daily.   senna-docusate 8.6-50 MG tablet Commonly known as:  Senokot-S Take 1 tablet by mouth at bedtime as needed for mild constipation.   traMADol 50 MG tablet Commonly known as:  ULTRAM Take 1-2 tablets (50-100 mg total) by mouth every 4 (four) hours as needed.            Discharge Care Instructions        Start     Ordered   10/09/16 0000  metoprolol tartrate (LOPRESSOR) 25 MG tablet  2 times daily     10/09/16 1214   10/09/16 0000  senna-docusate (SENOKOT-S) 8.6-50 MG tablet  At bedtime PRN     10/09/16 1214     Follow-up Information    Baldwin Jamaica, PA-C Follow up on 11/11/2016.   Specialty:  Cardiology Why:  11:30AM Contact information: [address] [place] [address] [phone]          Allergies  Allergen Reactions  . Duloxetine Other (See Comments)    REACTION: rhabdomyolysis  . Penicillins Other (See Comments)    SYNCOPE Has patient had a PCN reaction causing immediate rash, facial/tongue/throat swelling, SOB or lightheadedness with hypotension: Yes Has patient had a PCN reaction causing severe rash involving mucus membranes or skin necrosis:  No Has patient had a PCN reaction that required hospitalization No Has patient had a PCN reaction occurring within the last 10 years: No If all of the above answers are "NO", then may proceed with Cephalosporin use.   . Latex Rash    Consultations:  Cardiology  Electrophysiology  Procedures/Studies: Dg Chest 2 View  Result Date: 10/07/2016 CLINICAL DATA:  Four days of shortness of breath. History of atrial fibrillation, chronic respiratory failure, previous pulmonary embolism, former smoker, COPD. EXAM: CHEST  2 VIEW COMPARISON:  Portal chest x-ray of October 06, 2016 FINDINGS: Today's frontal view is obtained in a lordotic fashion. The lungs remain well-expanded. The interstitial markings are mildly prominent. The cardiac silhouette remains enlarged. The central pulmonary vascularity is prominent. There is no pleural effusion or pneumothorax. There is no discrete infiltrate.  IMPRESSION: COPD with superimposed low-grade CHF. There is no alveolar pneumonia nor edema. There is no pleural effusion. Electronically Signed   By: David  Martinique M.D.   On: 10/07/2016 08:33   Dg Chest 2 View  Result Date: 09/14/2016 CLINICAL DATA:  Productive cough and worsening shortness of breath. EXAM: CHEST  2 VIEW COMPARISON:  08/03/2016 FINDINGS: The cardio pericardial silhouette is enlarged. The lungs are clear without focal pneumonia, edema, pneumothorax or pleural effusion. Interstitial markings are diffusely coarsened with chronic features. The visualized bony structures of the thorax are intact. IMPRESSION: Stable exam. Chronic interstitial coarsening without acute cardiopulmonary findings. Electronically Signed   By: Misty Stanley M.D.   On: 09/14/2016 13:20   Ct Head Wo Contrast  Result Date: 10/08/2016 CLINICAL DATA:  Subacute neuro deficits. Blurred vision and headache EXAM: CT HEAD WITHOUT CONTRAST TECHNIQUE: Contiguous axial images were obtained from the base of the skull through the vertex without  intravenous contrast. COMPARISON:  CT head 04/08/2014 FINDINGS: Brain: Ventricle size normal. Negative for acute infarct. Negative for hemorrhage or mass. Mild chronic microvascular ischemic change in the white matter unchanged from the prior study. Vascular: Negative for hyperdense vessel Skull: Negative Sinuses/Orbits: Mild mucosal edema left maxillary sinus. Negative orbit. Cataract removal on the right Other: None IMPRESSION: Mild chronic microvascular ischemic change in the white matter. No acute abnormality. Electronically Signed   By: Franchot Gallo M.D.   On: 10/08/2016 12:56   Dg Chest Portable 1 View  Result Date: 10/06/2016 CLINICAL DATA:  A true for ablation. EXAM: PORTABLE CHEST 1 VIEW COMPARISON:  September 14, 2016 FINDINGS: The heart size and mediastinal contours are stable. The heart size is enlarged. There is no focal infiltrate, pulmonary edema, or pleural effusion. The visualized skeletal structures are stable. IMPRESSION: No active cardiopulmonary disease. Electronically Signed   By: Abelardo Diesel M.D.   On: 10/06/2016 13:38    EP STUDY CONCLUSIONS:  1. Isthmus-dependent right atrial flutter upon presentation.  2. Successful radiofrequency ablation of atrial flutter along the cavotricuspid isthmus with complete bidirectional isthmus block achieved.  3. No inducible arrhythmias following ablation.  4. No early apparent complications.   Subjective: Seen and examined at beside and had no complaints. Ready to go home. States she is a little SOB but that is chronic.  Discharge Exam: Vitals:   10/09/16 1132 10/09/16 1210  BP:    Pulse:    Resp:    Temp: 99.3 F (37.4 C)   SpO2:  98%   Vitals:   10/09/16 0808 10/09/16 0845 10/09/16 1132 10/09/16 1210  BP:      Pulse:      Resp:      Temp: 97.8 F (36.6 C)  99.3 F (37.4 C)   TempSrc: Oral  Oral   SpO2:  98%  98%  Weight:      Height:       General: Pt is an obese Caucasian female who is alert, awake, not in acute  distress Cardiovascular: RRR, S1/S2 +, no rubs, no gallops Respiratory: Diminished bilaterally, no wheezing, no rhonchi Abdominal: Soft, NT, Distended due to body habitus, bowel sounds + Extremities: no edema, no cyanosis  The results of significant diagnostics from this hospitalization (including imaging, microbiology, ancillary and laboratory) are listed below for reference.    Microbiology: Recent Results (from the past 240 hour(s))  MRSA PCR Screening     Status: None   Collection Time: 10/06/16  7:48 PM  Result Value Ref Range Status  MRSA by PCR NEGATIVE NEGATIVE Final    Comment:        The GeneXpert MRSA Assay (FDA approved for NASAL specimens only), is one component of a comprehensive MRSA colonization surveillance program. It is not intended to diagnose MRSA infection nor to guide or monitor treatment for MRSA infections.     Labs: BNP (last 3 results)  Recent Labs  09/14/16 1542  BNP 16.1   Basic Metabolic Panel:  Recent Labs Lab 10/06/16 1303 10/07/16 0314 10/07/16 0835 10/08/16 0302  NA 141 143  --  140  K 3.7 3.2*  --  3.6  CL 101 102  --  101  CO2 32 33*  --  32  GLUCOSE 128* 156*  --  164*  BUN 11 7  --  11  CREATININE 0.85 0.64  --  0.75  CALCIUM 8.9 8.1*  --  8.4*  MG  --   --  1.8 1.9  PHOS  --   --   --  4.2   Liver Function Tests:  Recent Labs Lab 10/08/16 0302  AST 13*  ALT 12*  ALKPHOS 68  BILITOT 0.9  PROT 5.5*  ALBUMIN 3.1*   No results for input(s): LIPASE, AMYLASE in the last 168 hours. No results for input(s): AMMONIA in the last 168 hours. CBC:  Recent Labs Lab 10/06/16 1303 10/07/16 0314 10/08/16 0302 10/09/16 0959  WBC 9.5 8.1 7.8 10.4  NEUTROABS  --   --  4.5 7.5  HGB 12.3 11.0* 10.8* 10.6*  HCT 37.5 34.6* 32.9* 32.9*  MCV 91.2 92.3 92.2 93.5  PLT 244 199 207 226   Cardiac Enzymes:  Recent Labs Lab 10/06/16 1727 10/06/16 2001 10/06/16 2229  TROPONINI <0.03 <0.03 <0.03   BNP: Invalid input(s):  POCBNP CBG: No results for input(s): GLUCAP in the last 168 hours. D-Dimer No results for input(s): DDIMER in the last 72 hours. Hgb A1c No results for input(s): HGBA1C in the last 72 hours. Lipid Profile No results for input(s): CHOL, HDL, LDLCALC, TRIG, CHOLHDL, LDLDIRECT in the last 72 hours. Thyroid function studies No results for input(s): TSH, T4TOTAL, T3FREE, THYROIDAB in the last 72 hours.  Invalid input(s): FREET3 Anemia work up No results for input(s): VITAMINB12, FOLATE, FERRITIN, TIBC, IRON, RETICCTPCT in the last 72 hours. Urinalysis    Component Value Date/Time   COLORURINE YELLOW 09/14/2016 0040   APPEARANCEUR HAZY (A) 09/14/2016 0040   LABSPEC 1.021 09/14/2016 0040   PHURINE 5.0 09/14/2016 0040   GLUCOSEU >=500 (A) 09/14/2016 0040   GLUCOSEU NEGATIVE 04/11/2015 1011   HGBUR NEGATIVE 09/14/2016 0040   HGBUR negative 08/06/2008 1214   BILIRUBINUR NEGATIVE 09/14/2016 0040   BILIRUBINUR NEG 04/24/2012 1156   KETONESUR NEGATIVE 09/14/2016 0040   PROTEINUR NEGATIVE 09/14/2016 0040   UROBILINOGEN 2.0 (A) 04/11/2015 1011   NITRITE NEGATIVE 09/14/2016 0040   LEUKOCYTESUR MODERATE (A) 09/14/2016 0040   Sepsis Labs Invalid input(s): PROCALCITONIN,  WBC,  LACTICIDVEN Microbiology Recent Results (from the past 240 hour(s))  MRSA PCR Screening     Status: None   Collection Time: 10/06/16  7:48 PM  Result Value Ref Range Status   MRSA by PCR NEGATIVE NEGATIVE Final    Comment:        The GeneXpert MRSA Assay (FDA approved for NASAL specimens only), is one component of a comprehensive MRSA colonization surveillance program. It is not intended to diagnose MRSA infection nor to guide or monitor treatment for MRSA infections.    Time coordinating  discharge: 35 minutes  SIGNED:  Kerney Elbe, DO [facility] 10/09/2016, 12:14 PM Pager [phone]  If 7PM-7AM, please contact night-coverage [email or web address] Password [email or web address]

## 2016-10-11 ENCOUNTER — Telehealth: Payer: Self-pay | Admitting: *Deleted

## 2016-10-11 NOTE — Telephone Encounter (Signed)
Transition Care Management Follow-up Telephone Call   Date discharged? 10/09/16   How have you been since you were released from the hospital? Pt states she could be feeling a lot better. Still have constant cough (dry). als been nauseated     Do you understand why you were in the hospital? YES   Do you understand the discharge instructions? YES   Where were you discharged to? Home   Items Reviewed:  Medications reviewed: YES  Allergies reviewed: YES  Dietary changes reviewed: YES, Heart healthy  Referrals reviewed: YES, have appt w/Dr. Melvyn Novas on 10/17/*18   Functional Questionnaire:   Activities of Daily Living (ADLs):   She states she are independent in the following: bathing and hygiene, feeding, continence, grooming, toileting and dressing States she require assistance with the following: ambulation sometimes   Any transportation issues/concerns?: NO   Any patient concerns? YES, ? About the sleep study that she need to have done. Inform pt when she see MD this Thursday he may do an referral, or she may need to talk w/Dr. Melvyn Novas   Confirmed importance and date/time of follow-up visits scheduled YES, 10/14/16  Provider Appointment booked with Dr. Jenny Reichmann  Confirmed with patient if condition begins to worsen call PCP or go to the ER.  Patient was given the office number and encouraged to call back with question or concerns.  : YES

## 2016-10-14 ENCOUNTER — Emergency Department (HOSPITAL_COMMUNITY)
Admission: EM | Admit: 2016-10-14 | Discharge: 2016-10-14 | Disposition: A | Discharge status: Home / Self Care | Payer: BLUE CROSS/BLUE SHIELD | Attending: Emergency Medicine | Admitting: Emergency Medicine

## 2016-10-14 ENCOUNTER — Ambulatory Visit (INDEPENDENT_AMBULATORY_CARE_PROVIDER_SITE_OTHER): Payer: BLUE CROSS/BLUE SHIELD | Admitting: Internal Medicine

## 2016-10-14 ENCOUNTER — Encounter (HOSPITAL_COMMUNITY): Payer: Self-pay | Admitting: Emergency Medicine

## 2016-10-14 ENCOUNTER — Other Ambulatory Visit: Payer: Self-pay

## 2016-10-14 ENCOUNTER — Emergency Department (HOSPITAL_COMMUNITY): Payer: BLUE CROSS/BLUE SHIELD

## 2016-10-14 ENCOUNTER — Telehealth: Payer: Self-pay | Admitting: Cardiology

## 2016-10-14 ENCOUNTER — Encounter: Payer: Self-pay | Admitting: Internal Medicine

## 2016-10-14 VITALS — BP 134/76 | HR 104 | Temp 97.6°F | Ht 62.0 in | Wt 253.0 lb

## 2016-10-14 DIAGNOSIS — I5033 Acute on chronic diastolic (congestive) heart failure: Secondary | ICD-10-CM | POA: Insufficient documentation

## 2016-10-14 DIAGNOSIS — J9612 Chronic respiratory failure with hypercapnia: Secondary | ICD-10-CM

## 2016-10-14 DIAGNOSIS — Z79899 Other long term (current) drug therapy: Secondary | ICD-10-CM | POA: Diagnosis not present

## 2016-10-14 DIAGNOSIS — I5032 Chronic diastolic (congestive) heart failure: Secondary | ICD-10-CM

## 2016-10-14 DIAGNOSIS — J45909 Unspecified asthma, uncomplicated: Secondary | ICD-10-CM | POA: Diagnosis not present

## 2016-10-14 DIAGNOSIS — R197 Diarrhea, unspecified: Secondary | ICD-10-CM | POA: Diagnosis not present

## 2016-10-14 DIAGNOSIS — E039 Hypothyroidism, unspecified: Secondary | ICD-10-CM | POA: Diagnosis not present

## 2016-10-14 DIAGNOSIS — Z87891 Personal history of nicotine dependence: Secondary | ICD-10-CM | POA: Diagnosis not present

## 2016-10-14 DIAGNOSIS — Z853 Personal history of malignant neoplasm of breast: Secondary | ICD-10-CM | POA: Insufficient documentation

## 2016-10-14 DIAGNOSIS — Z91040 Latex allergy status: Secondary | ICD-10-CM | POA: Insufficient documentation

## 2016-10-14 DIAGNOSIS — I4891 Unspecified atrial fibrillation: Secondary | ICD-10-CM | POA: Diagnosis not present

## 2016-10-14 DIAGNOSIS — I481 Persistent atrial fibrillation: Secondary | ICD-10-CM

## 2016-10-14 DIAGNOSIS — J9611 Chronic respiratory failure with hypoxia: Secondary | ICD-10-CM

## 2016-10-14 DIAGNOSIS — Z7901 Long term (current) use of anticoagulants: Secondary | ICD-10-CM | POA: Insufficient documentation

## 2016-10-14 DIAGNOSIS — R000 Tachycardia, unspecified: Secondary | ICD-10-CM | POA: Diagnosis not present

## 2016-10-14 DIAGNOSIS — J449 Chronic obstructive pulmonary disease, unspecified: Secondary | ICD-10-CM | POA: Diagnosis not present

## 2016-10-14 DIAGNOSIS — I480 Paroxysmal atrial fibrillation: Secondary | ICD-10-CM | POA: Insufficient documentation

## 2016-10-14 DIAGNOSIS — I110 Hypertensive heart disease with heart failure: Secondary | ICD-10-CM | POA: Insufficient documentation

## 2016-10-14 LAB — I-STAT TROPONIN, ED: Troponin i, poc: 0 ng/mL (ref 0.00–0.08)

## 2016-10-14 LAB — CBC
HCT: 30.2 % — ABNORMAL LOW (ref 36.0–46.0)
Hemoglobin: 9.7 g/dL — ABNORMAL LOW (ref 12.0–15.0)
MCH: 29.7 pg (ref 26.0–34.0)
MCHC: 32.1 g/dL (ref 30.0–36.0)
MCV: 92.4 fL (ref 78.0–100.0)
Platelets: 280 10*3/uL (ref 150–400)
RBC: 3.27 MIL/uL — ABNORMAL LOW (ref 3.87–5.11)
RDW: 16.3 % — ABNORMAL HIGH (ref 11.5–15.5)
WBC: 7.1 10*3/uL (ref 4.0–10.5)

## 2016-10-14 LAB — BASIC METABOLIC PANEL
Anion gap: 9 (ref 5–15)
BUN: 9 mg/dL (ref 6–20)
CO2: 32 mmol/L (ref 22–32)
Calcium: 8.5 mg/dL — ABNORMAL LOW (ref 8.9–10.3)
Chloride: 98 mmol/L — ABNORMAL LOW (ref 101–111)
Creatinine, Ser: 0.85 mg/dL (ref 0.44–1.00)
GFR calc Af Amer: >60 mL/min (ref >60)
GFR calc non Af Amer: >60 mL/min (ref >60)
Glucose, Bld: 133 mg/dL — ABNORMAL HIGH (ref 65–99)
Potassium: 3.4 mmol/L — ABNORMAL LOW (ref 3.5–5.1)
Sodium: 139 mmol/L (ref 135–145)

## 2016-10-14 MED ORDER — POTASSIUM CHLORIDE CRYS ER 20 MEQ PO TBCR: 20.0000 meq | EXTENDED_RELEASE_TABLET | Freq: Every day | ORAL | 0 refills | Status: DC

## 2016-10-14 MED ORDER — MIDAZOLAM HCL 2 MG/2ML IJ SOLN
0.5000 mg | Freq: Once | INTRAMUSCULAR | Status: AC
  Administered 2016-10-14: 0.5 mg via INTRAVENOUS
  Filled 2016-10-14: qty 2

## 2016-10-14 MED ORDER — ETOMIDATE 2 MG/ML IV SOLN
10.0000 mg | Freq: Once | INTRAVENOUS | Status: AC
  Administered 2016-10-14: 10 mg via INTRAVENOUS
  Filled 2016-10-14: qty 10

## 2016-10-14 MED ORDER — POTASSIUM CHLORIDE CRYS ER 20 MEQ PO TBCR
40.0000 meq | EXTENDED_RELEASE_TABLET | Freq: Once | ORAL | Status: AC
  Administered 2016-10-14: 40 meq via ORAL
  Filled 2016-10-14: qty 2

## 2016-10-14 NOTE — Assessment & Plan Note (Addendum)
ecg reviewed, pt is quite dissapointed that the ecg is c/w afib with RVR again; unfortunately due to this, we need to refer her back to the ED; perhaps labs studies I have described below can be pursued there  Note:  Total time for pt hx, exam, review of record with pt in the room, determination of diagnoses and plan for further eval and tx is > 40 min, with over 50% spent in coordination and counseling of patient, incluiding the differential dx, tx, further evaluatoin and other management of afib with RVR, dCHF, acute diarrhea  And copd

## 2016-10-14 NOTE — Assessment & Plan Note (Signed)
Etiology unclear, ? Viral incidental related to recent hospn, vs cidff vs other  - for cbc, stool cx and C diff GBH/toxins A&B, for immodium prn

## 2016-10-14 NOTE — Telephone Encounter (Signed)
Spoke with pt, Aware of dr crenshaw's recommendations.  °

## 2016-10-14 NOTE — ED Provider Notes (Signed)
[facility] DEPT Provider Note   CSN: [id number] Arrival date & time: 10/14/16  1531     History   Chief Complaint Chief Complaint  Patient presents with  . Atrial Fibrillation    HPI [name] [name] is a 66 y.o. female.  HPI Patient had ablation last week for atrial fibrillation. She rolled over in bed last night and felt her heart start to race and skip. She has had increased shortness of breath. He does have some degree of chronic shortness of breath on home oxygen. With exertion though she is becoming quite short of breath and heart rate is increasing. She went to her PCPs office today to be checked and her heart rate was identified at first to be at 165. At rest it has been ranging from 100s to 140s. She denies associated chest pain. No syncope. Patient is taking Eliquis and metoprolol 25 mg twice daily. She has also been taking Lasix. Past Medical History:  Diagnosis Date  . Anemia    hx  . Anxiety   . Arthritis   . Asthma   . Atrial fibrillation and flutter ([place])   . Atypical lobular hyperplasia of right breast 05/09/2015  . Breast CA ([place])    ?  Marland Kitchen Chronic rhinitis   . COPD (chronic obstructive pulmonary disease) ([place])    [place]. PFTs 07/25/06 FEV1 55% ratio 53, DLC0 51% HFA 50% 12/16/2009  . Depression   . FRACTURE, RIB, RIGHT 06/18/2009  . Glucose intolerance (impaired glucose tolerance)    on steroids  . History of DVT (deep vein thrombosis)   . Hx of cardiac catheterization    LHC (01/2001):  Normal Cors.  EF 65%.;  LexiScan Myoview (03/2013): No ischemia, EF 61%, normal  . Hx of echocardiogram    Echo (11/2011):  Mod LVH, EF 60-65%, no RWMA, MAC, mild LAE, PASP 34.  Marland Kitchen HYPERLIPIDEMIA   . Hypertension   . Hypothyroidism   . Morbid obesity ([place])    target wt=179lb for BMI<30 Peak wt 282lb  . Osteoporosis    last BMD 4/08 -2.4, intolerant of bisphosphonates  . Peripheral vascular disease (H[facility])    hx dvt.pe  . Pneumonia    hx  . PONV (postoperative nausea and  vomiting)   . PREMATURE VENTRICULAR CONTRACTIONS   . PULMONARY EMBOLISM, HX OF 06/19/2007  . Rhabdomyolysis 07/29/2009  . Tremor   . VITAMIN D DEFICIENCY     Patient Active Problem List   Diagnosis Date Noted  . Diarrhea 10/14/2016  . Tachycardia 10/14/2016  . Blurred vision, left eye 10/08/2016  . Hypokalemia 10/08/2016  . Acute upper respiratory infection 09/14/2016  . Atrial fibrillation with RVR ([place]) 09/14/2016  . Atrial flutter with rapid ventricular response ([place])   . Allergic conjunctivitis 07/28/2016  . Influenza-like illness 02/11/2016  . Absolute anemia   . Upper airway cough syndrome 12/25/2015  . Recurrent sinus infections 08/30/2015  . Encounter for pre-operative cardiovascular clearance 05/20/2015  . Morbid obesity ([place]) 05/20/2015  . Atypical lobular hyperplasia of right breast 05/09/2015  . Fever, unspecified 04/11/2015  . Chronic diastolic CHF (congestive heart failure) ([place]) 12/04/2014  . Acute on chronic diastolic CHF (congestive heart failure), NYHA class 1 ([place]) 11/11/2014  . Acute sinus infection 09/03/2014  . Rib pain on right side 04/11/2014  . Paresthesias in left hand 03/04/2014  . Bilateral knee pain 02/13/2014  . Nausea alone 07/10/2013  . Encounter for therapeutic drug monitoring 02/09/2013  . COPD exacerbation ([place]) 01/17/2013  . Chest  pain 04/19/2012  . Suicide gesture ([place]) 03/30/2012  . Major depressive disorder, single episode, unspecified 03/30/2012  . Tremor due to multiple drugs 11/11/2011  . Diplopia 11/11/2011  . Allergic rhinitis 09/14/2011  . Right leg weakness 04/14/2011  . Long term current use of anticoagulant therapy 04/22/2010  . Impaired glucose tolerance 04/15/2010  . Preventative health care 04/15/2010  . Chronic rhinitis 04/15/2010  . PREMATURE VENTRICULAR CONTRACTIONS 10/23/2008  . Atrial flutter ([place]) 07/01/2008  . Cough 06/20/2008  . Vitamin D deficiency 10/27/2007  . HYPERLIPIDEMIA 06/19/2007  . Anxiety state  06/19/2007  . PAF (paroxysmal atrial fibrillation) ([place]) 06/19/2007  . PULMONARY EMBOLISM, HX OF 06/19/2007  . Edema 03/20/2007  . Chronic respiratory failure with hypoxia and hypercapnia ([place]) 01/13/2007  . Depression 08/06/2006  . COPD GOLD II 08/06/2006  . Severe obesity (BMI >= 40) ([place]) 07/29/2006  . Essential hypertension 07/29/2006  . OSTEOPOROSIS 07/29/2006  . DEEP VENOUS THROMBOPHLEBITIS, HX OF 07/29/2006    Past Surgical History:  Procedure Laterality Date  . A-FLUTTER ABLATION N/A 10/08/2016   Procedure: A-FLUTTER ABLATION;  Surgeon: Evans Lance, MD;  Location: [facility];  Service: Cardiovascular;  Laterality: N/A;  . ABDOMINAL HYSTERECTOMY    . BREAST LUMPECTOMY WITH RADIOACTIVE SEED LOCALIZATION Right 06/05/2015   Procedure: RIGHT BREAST LUMPECTOMY WITH RADIOACTIVE SEED LOCALIZATION;  Surgeon: Fanny Skates, MD;  Location: [facility];  Service: General;  Laterality: Right;  . BREAST SURGERY Left    s/p mass removal  . CARDIOVERSION N/A 12/11/2014   Procedure: CARDIOVERSION;  Surgeon: Josue Hector, MD;  Location: [facility];  Service: Cardiovascular;  Laterality: N/A;  . CARDIOVERSION N/A 09/15/2016   Procedure: CARDIOVERSION;  Surgeon: Jerline Pain, MD;  Location[facility];  Service: Cardiovascular;  Laterality: N/A;  . COLONOSCOPY  10/22/2003, ?2006  . COLONOSCOPY WITH PROPOFOL N/A 01/15/2016   Procedure: COLONOSCOPY WITH PROPOFOL;  Surgeon: Gatha Mayer, MD;  Location: [facility] ENDOSCOPY;  Service: Endoscopy;  Laterality: N/A;  . ESOPHAGOGASTRODUODENOSCOPY (EGD) WITH PROPOFOL N/A 01/15/2016   Procedure: ESOPHAGOGASTRODUODENOSCOPY (EGD) WITH PROPOFOL;  Surgeon: Gatha Mayer, MD;  Location: [facility] ENDOSCOPY;  Service: Endoscopy;  Laterality: N/A;  . s/p left arm fracture with fall off chair    . skin graft to middle R finger  1975  . TEE WITHOUT CARDIOVERSION N/A 12/11/2014   Procedure: TRANSESOPHAGEAL ECHOCARDIOGRAM (TEE);  Surgeon: Josue Hector, MD;  Location:  [facility] ENDOSCOPY;  Service: Cardiovascular;  Laterality: N/A;  . TONSILLECTOMY      OB History    No data available       Home Medications    Prior to Admission medications   Medication Sig Start Date End Date Taking? Authorizing Provider  acetaminophen (TYLENOL) 650 MG CR tablet Take 650-1,300 mg by mouth as needed for pain. Per bottle as needed   Yes [provider]  albuterol (PROAIR HFA) 108 (90 Base) MCG/ACT inhaler Inhale 2 puffs into the lungs every 6 (six) hours as needed for wheezing or shortness of breath. 09/28/16  Yes Tanda Rockers, MD  atorvastatin (LIPITOR) 10 MG tablet Take 1 tablet (10 mg total) by mouth daily. 07/05/16  Yes Biagio Borg, MD  budesonide-formoterol [facility]) 160-4.5 MCG/ACT inhaler Inhale 2 puffs into the lungs 2 (two) times daily. 12/12/15  Yes Parrett, [name] S, NP  Calcium Carbonate-Vitamin D 600-400 MG-UNIT per tablet Take 1 tablet by mouth 2 (two) times daily.     Yes [provider]  Dextromethorphan-Guaifenesin [facility] DM  MAXIMUM STRENGTH) 60-1200 MG TB12 Take 1 tablet by mouth every 12 (twelve) hours as needed (cough/congestion). With flutter   Yes [provider]  ELIQUIS 5 MG TABS tablet TAKE 1 TABLET TWICE A DAY Patient taking differently: TAKE 5 MG  TABLET TWICE A DAY 07/19/16  Yes Lelon Perla, MD  ferrous sulfate 325 (65 FE) MG tablet Take 325 mg by mouth 2 (two) times daily with a meal.    Yes [provider]  FLUoxetine (PROZAC) 40 MG capsule TAKE 1 CAPSULE DAILY Patient taking differently: TAKE 40 mg CAPSULE DAILY 06/22/16  Yes Biagio Borg, MD  fluticasone [facility]) 50 MCG/ACT nasal spray Place 2 sprays into both nostrils 2 (two) times daily as needed for allergies or rhinitis.   Yes [provider]  furosemide (LASIX) 80 MG tablet Take 1 tablet (80 mg total) by mouth 2 (two) times daily. 09/16/16  Yes Hongalgi, Lenis Dickinson, MD  glucosamine-chondroitin 500-400 MG tablet Take 1 tablet by mouth every  morning.    Yes [provider]  KLOR-CON M20 20 MEQ tablet Take 1 tablet (20 mEq total) by mouth 2 (two) times daily. Please schedule appointment for refills. 08/04/16  Yes Lelon Perla, MD  levalbuterol Penne Lash) 1.25 MG/3ML nebulizer solution Take 1.25 mg by nebulization every 4 (four) hours. 09/28/16  Yes Tanda Rockers, MD  levothyroxine (SYNTHROID, LEVOTHROID) 25 MCG tablet TAKE 1 TABLET DAILY BEFORE BREAKFAST Patient taking differently: TAKE 25 mg TABLET DAILY BEFORE BREAKFAST 01/22/16  Yes Biagio Borg, MD  metoprolol tartrate (LOPRESSOR) 25 MG tablet Take 1 tablet (25 mg total) by mouth 2 (two) times daily. 10/09/16  Yes Sheikh, Omair Latif, DO  Olopatadine HCl (PATADAY) 0.2 % SOLN Place 1 drop into both eyes daily.   Yes [provider]  omeprazole (PRILOSEC) 20 MG capsule Take 30ms before supper daily 09/16/16  Yes Hongalgi, ALenis Dickinson MD  OXYGEN Inhale 2.5 L/min into the lungs continuous.   Yes [provider]  propafenone (RYTHMOL SR) 325 MG 12 hr capsule Take 1 capsule (325 mg total) by mouth 2 (two) times daily. 11/11/15  Yes CLelon Perla MD  senna-docusate (SENOKOT-S) 8.6-50 MG tablet Take 1 tablet by mouth at bedtime as needed for mild constipation. 10/09/16  Yes Sheikh, Omair Latif, DO  traMADol (ULTRAM) 50 MG tablet Take 1-2 tablets (50-100 mg total) by mouth every 4 (four) hours as needed. 08/05/16  Yes WTanda Rockers MD    Family History Family History  Problem Relation Age of Onset  . Heart disease Mother   . Liver disease Mother        alcohol related  . Asthma Brother   . Esophageal cancer Brother   . Asthma Son   . Colon cancer Neg Hx   . Stomach cancer Neg Hx   . Pancreatic cancer Neg Hx   . Inflammatory bowel disease Neg Hx     Social History Social History  Substance Use Topics  . Smoking status: Former Smoker    Packs/day: 1.00    Years: 35.00    Types: Cigarettes    Quit date: 01/12/1996  . Smokeless tobacco: Never Used   . Alcohol use No     Allergies   Duloxetine; Penicillins; and Latex   Review of Systems Review of Systems 10 Systems reviewed and are negative for acute change except as noted in the HPI.   Physical Exam Updated Vital Signs BP 130/68 (BP Location: Left Arm)  Pulse 64   Temp 98.4 F (36.9 C) (Oral)   Resp 19   Ht _0  (1.575 m)   Wt 114.8 kg (253 lb)   SpO2 93%   BMI 46.27 kg/m   Physical Exam  Constitutional: She is oriented to person, place, and time. No distress.  Alert and nontoxic. Obesity. Mildly short of breath at rest.  HENT:  Head: Normocephalic and atraumatic.  Mouth/Throat: Oropharynx is clear and moist.  Eyes: Conjunctivae and EOM are normal.  Neck: Neck supple.  Cardiovascular:  No murmur heard. Tachycardia. Irregularly irregular. Monitor shows atrial fibrillation rate ranging from 90 occasionally up to 150, predominantly 120s.  Pulmonary/Chest: No respiratory distress.  Mild increased work of breathing at rest. Basilar rails.  Abdominal: Soft. She exhibits no distension. There is no tenderness. There is no guarding.  Musculoskeletal: Normal range of motion. She exhibits no edema or tenderness.  Neurological: She is alert and oriented to person, place, and time. No cranial nerve deficit. She exhibits normal muscle tone. Coordination normal.  Skin: Skin is warm and dry.  Psychiatric: She has a normal mood and affect.  Nursing note and vitals reviewed.    ED Treatments / Results  Labs (all labs ordered are listed, but only abnormal results are displayed) Labs Reviewed  BASIC METABOLIC PANEL - Abnormal; Notable for the following:       Result Value   Potassium 3.4 (*)    Chloride 98 (*)    Glucose, Bld 133 (*)    Calcium 8.5 (*)    All other components within normal limits  CBC - Abnormal; Notable for the following:    RBC 3.27 (*)    Hemoglobin 9.7 (*)    HCT 30.2 (*)    RDW 16.3 (*)    All other components within normal limits  BRAIN  NATRIURETIC PEPTIDE  TROPONIN I  TROPONIN I  TROPONIN I  I-STAT TROPONIN, ED    EKG  EKG Interpretation  Date/Time:  Thursday October 14 2016 15:38:16 EDT Ventricular Rate:  125 PR Interval:    QRS Duration: 82 QT Interval:  316 QTC Calculation: 456 R Axis:   54 Text Interpretation:  Atrial fibrillation with rapid ventricular response Nonspecific ST and T wave abnormality Abnormal ECG agree. similar to old Confirmed by Charlesetta Shanks [phone]) on 10/14/2016 4:37:35 PM       Radiology Dg Chest 2 View  Result Date: 10/14/2016 CLINICAL DATA:  Pt c/o left-sided chest pain, SOB, dry cough, and congestion x 1 day. Hx of asthma, AFIB, COPD, cardiac catheterization, HTN, PNA, AND PE. Pt is a former smoker. EXAM: CHEST  2 VIEW COMPARISON:  10/07/2016 FINDINGS: The heart is mildly enlarged. There are mild interstitial changes compatible with interstitial edema. No focal consolidations or overt edema. Small effusions are present. IMPRESSION: Cardiomegaly and mild pulmonary edema. Electronically Signed   By: Nolon Nations M.D.   On: 10/14/2016 16:11    Procedures .Sedation Date/Time: 10/14/2016 6:36 PM Performed by: Charlesetta Shanks Authorized by: Charlesetta Shanks   Consent:    Consent obtained:  Verbal and written   Consent given by:  Patient   Risks discussed:  Allergic reaction, dysrhythmia, inadequate sedation, nausea, prolonged hypoxia resulting in organ damage, prolonged sedation necessitating reversal, respiratory compromise necessitating ventilatory assistance and intubation and vomiting Indications:    Procedure performed:  Cardioversion   Procedure necessitating sedation performed by:  Different physician   Intended level of sedation:  Moderate (conscious sedation) Pre-sedation assessment:  Time since last food or drink:  8 hours   ASA classification: class 3 - patient with severe systemic disease     Neck mobility: normal     Mouth opening:  3 or more finger widths    Thyromental distance:  3 finger widths   Mallampati score:  III - soft palate, base of uvula visible   Pre-sedation assessments completed and reviewed: airway patency, cardiovascular function, hydration status, mental status, nausea/vomiting, pain level, respiratory function and temperature     Pre-sedation assessment completed:  10/14/2016 6:38 PM Immediate pre-procedure details:    Reassessment: Patient reassessed immediately prior to procedure     Reviewed: vital signs and NPO status     Verified: bag valve mask available, emergency equipment available, intubation equipment available, IV patency confirmed, oxygen available, reversal medications available and suction available   Procedure details (see MAR for exact dosages):    Preoxygenation:  Nasal cannula   Sedation:  Etomidate and midazolam   Intra-procedure monitoring:  Blood pressure monitoring, cardiac monitor, continuous pulse oximetry, continuous capnometry, frequent LOC assessments and frequent vital sign checks   Intra-procedure events: none     Total Provider sedation time (minutes):  30 Post-procedure details:    Post-sedation assessment completed:  10/14/2016 7:12 PM   Attendance: Constant attendance by certified staff until patient recovered     Recovery: Patient returned to pre-procedure baseline     Post-sedation assessments completed and reviewed: airway patency, cardiovascular function, hydration status, mental status, nausea/vomiting, pain level, respiratory function and temperature     Patient tolerance:  Tolerated well, no immediate complications    (including critical care time)  Medications Ordered in ED Medications  midazolam (VERSED) injection 0.5 mg (0.5 mg Intravenous Given 10/14/16 1845)  etomidate (AMIDATE) injection 10 mg (10 mg Intravenous Given 10/14/16 1848)     Initial Impression / Assessment and Plan / ED Course  I have reviewed the triage vital signs and the nursing notes.  Pertinent labs & imaging  results that were available during my care of the patient were reviewed by me and considered in my medical decision making (see chart for details).  Clinical Course as of Oct 15 2003  Thu Oct 14, 2016  1636 HCT: Deeann Dowse [MP]    Clinical Course User Index [MP] Charlesetta Shanks, MD   Blood pressures are slightly low. Systolic pressures 820 or less on monitor. Consultation: Dr. Florina Ou has seen the patient and performed cardioversion in the emergency department with sedation provided by myself. Final Clinical Impressions(s) / ED Diagnoses   Final diagnoses:  Atrial fibrillation with RVR [facility])   Patient presents status post ablation. She did develop atrial fibrillation yesterday evening. Cardioversion performed in the emergency department as outlined. Patient is discharged in stable condition. She reports she feels improved. She is alert with normal mental status. Patient has chronic respiratory failure on home oxygen. She is at baseline respiratory status. New Prescriptions New Prescriptions   No medications on file     Charlesetta Shanks, MD 10/14/16 2007

## 2016-10-14 NOTE — Telephone Encounter (Signed)
New message     Pt c/o medication issue:  1. Name of Medication:  metoprolol tartrate (LOPRESSOR) 25 MG tablet Take 1 tablet (25 mg total) by mouth 2 (two) times daily.     2. How are you currently taking this medication (dosage and times per day)? 1 tab 2x a day  3. Are you having a reaction (difficulty breathing--STAT)?  yes  4. What is your medication issue? Diarrhea, upset stomach, very weak , does not think she is getting better because of this medication

## 2016-10-14 NOTE — Progress Notes (Signed)
Subjective:    Patient ID: [name] [name], female    DOB: [birth year], 66 y.o.   MRN: [id number]  HPI 66 y.o.femalewith medical history significant for HTN, paroxysmal atrial fibrillation,/atrial flutter, morbid obesity, Chronic respiratory failure/ COPD on 4 L prn , depression, who presented to the ER with complaints of feeling poorly, associated with tachycardia, and "feeling hot." Of note, the patient had been recently hospitalized with similar symptoms, diagnosed with atrial fibrillation with RVR. The patient has had multiple cardioversions, including one on 09/15/2016, and was referred to cardiology in consideration for ablation procedure. Patient was admitted for Atrial Flutter with RVR and underwent EP Study and Catheterablation with successful conversion back into Sinus Rhythm. Cardiology evaluated and recommended continuing AC, Rhythmol, and Metoprolol.  Pt was felt stable for d/c with: Recommendations for Outpatient Follow-up:  1. Follow up with PCP in 1-2 weeks 2. Follow up with Cardiology as an outpatient 3. Follow up with Pulmonary Dr. Melvyn Novas as an outpatient 4. Follow up for outpatient Sleep Study 5. Please obtain CMP/CBC, Mag, Phos in one week Today, she dioes not feel well, wonders if the med change is causing her to not bounce back this time, and still has occas palpitations, also noted with mild elevated heart rate today.  Has been compliant with all meds including the lasix.  C/o low appetite, sweats, and multiple episodes watery diarrhea without high fever, chills, worsening abd pain or blood (but hard to tell since stool always black on the iron).  No hx of c diff.  No sick contacts.  Pt denies chest pain, increased sob or doe, wheezing, orthopnea, PND, increased LE swelling, or syncope, but has been occasionally dizzy.  No falls.  Just feels weak and terrible today.  Past Medical History:  Diagnosis Date  . Anemia    hx  . Anxiety   . Arthritis   . Asthma   . Atrial  fibrillation and flutter ([place])   . Atypical lobular hyperplasia of right breast 05/09/2015  . Breast CA ([place])    ?  Marland Kitchen Chronic rhinitis   . COPD (chronic obstructive pulmonary disease) ([place])    [place]. PFTs 07/25/06 FEV1 55% ratio 53, DLC0 51% HFA 50% 12/16/2009  . Depression   . FRACTURE, RIB, RIGHT 06/18/2009  . Glucose intolerance (impaired glucose tolerance)    on steroids  . History of DVT (deep vein thrombosis)   . Hx of cardiac catheterization    LHC (01/2001):  Normal Cors.  EF 65%.;  LexiScan Myoview (03/2013): No ischemia, EF 61%, normal  . Hx of echocardiogram    Echo (11/2011):  Mod LVH, EF 60-65%, no RWMA, MAC, mild LAE, PASP 34.  Marland Kitchen HYPERLIPIDEMIA   . Hypertension   . Hypothyroidism   . Morbid obesity ([place])    target wt=179lb for BMI<30 Peak wt 282lb  . Osteoporosis    last BMD 4/08 -2.4, intolerant of bisphosphonates  . Peripheral vascular disease ([facility])    hx dvt.pe  . Pneumonia    hx  . PONV (postoperative nausea and vomiting)   . PREMATURE VENTRICULAR CONTRACTIONS   . PULMONARY EMBOLISM, HX OF 06/19/2007  . Rhabdomyolysis 07/29/2009  . Tremor   . VITAMIN D DEFICIENCY    Past Surgical History:  Procedure Laterality Date  . A-FLUTTER ABLATION N/A 10/08/2016   Procedure: A-FLUTTER ABLATION;  Surgeon: Evans Lance, MD;  Location: [place] [facility];  Service: Cardiovascular;  Laterality: N/A;  . ABDOMINAL HYSTERECTOMY    . BREAST  LUMPECTOMY WITH RADIOACTIVE SEED LOCALIZATION Right 06/05/2015   Procedure: RIGHT BREAST LUMPECTOMY WITH RADIOACTIVE SEED LOCALIZATION;  Surgeon: Fanny Skates, MD;  Location: [place];  Service: General;  Laterality: Right;  . BREAST SURGERY Left    s/p mass removal  . CARDIOVERSION N/A 12/11/2014   Procedure: CARDIOVERSION;  Surgeon: Josue Hector, MD;  Location: [place];  Service: Cardiovascular;  Laterality: N/A;  . CARDIOVERSION N/A 09/15/2016   Procedure: CARDIOVERSION;  Surgeon: Jerline Pain, MD;  Location: [facility] ENDOSCOPY;  Service:  Cardiovascular;  Laterality: N/A;  . COLONOSCOPY  10/22/2003, ?2006  . COLONOSCOPY WITH PROPOFOL N/A 01/15/2016   Procedure: COLONOSCOPY WITH PROPOFOL;  Surgeon: Gatha Mayer, MD;  Location: [facility] ENDOSCOPY;  Service: Endoscopy;  Laterality: N/A;  . ESOPHAGOGASTRODUODENOSCOPY (EGD) WITH PROPOFOL N/A 01/15/2016   Procedure: ESOPHAGOGASTRODUODENOSCOPY (EGD) WITH PROPOFOL;  Surgeon: Gatha Mayer, MD;  Location: [facility] ENDOS[facility];  Service: Endoscopy;  Laterality: N/A;  . s/p left arm fracture with fall off chair    . skin graft to middle R finger  1975  . TEE WITHOUT CARDIOVERSION N/A 12/11/2014   Procedure: TRANSESOPHAGEAL ECHOCARDIOGRAM (TEE);  Surgeon: Josue Hector, MD;  Location: [facility] ENDOSCOPY;  Service: Cardiovascular;  Laterality: N/A;  . TONSILLECTOMY      reports that she quit smoking about 20 years ago. Her smoking use included Cigarettes. She has a 35.00 pack-year smoking history. She has never used smokeless tobacco. She reports that she does not drink alcohol or use drugs. family history includes Asthma in her brother and son; Esophageal cancer in her brother; Heart disease in her mother; Liver disease in her mother. Allergies  Allergen Reactions  . Duloxetine Other (See Comments)    REACTION: rhabdomyolysis  . Penicillins Other (See Comments)    SYNCOPE Has patient had a PCN reaction causing immediate rash, facial/tongue/throat swelling, SOB or lightheadedness with hypotension: Yes Has patient had a PCN reaction causing severe rash involving mucus membranes or skin necrosis: No Has patient had a PCN reaction that required hospitalization No Has patient had a PCN reaction occurring within the last 10 years: No If all of the above answers are "NO", then may proceed with Cephalosporin use.   . Latex Rash   Current Outpatient Prescriptions on File Prior to Visit  Medication Sig Dispense Refill  . acetaminophen (TYLENOL) 650 MG CR tablet Take 650-1,300 mg by mouth as needed for pain. Per  bottle as needed    . albuterol (PROAIR HFA) 108 (90 Base) MCG/ACT inhaler Inhale 2 puffs into the lungs every 6 (six) hours as needed for wheezing or shortness of breath. 3 Inhaler 3  . atorvastatin (LIPITOR) 10 MG tablet Take 1 tablet (10 mg total) by mouth daily. 90 tablet 2  . budesonide-formoterol (SYMBICORT) 160-4.5 MCG/ACT inhaler Inhale 2 puffs into the lungs 2 (two) times daily. 3 Inhaler 2  . Calcium Carbonate-Vitamin D 600-400 MG-UNIT per tablet Take 1 tablet by mouth 2 (two) times daily.      Marland Kitchen Dextromethorphan-Guaifenesin (MUCINEX DM MAXIMUM STRENGTH) 60-1200 MG TB12 Take 1 tablet by mouth every 12 (twelve) hours as needed (cough/congestion). With flutter    . ELIQUIS 5 MG TABS tablet TAKE 1 TABLET TWICE A DAY 180 tablet 0  . ferrous sulfate 325 (65 FE) MG tablet Take 325 mg by mouth 2 (two) times daily with a meal.     . FLUoxetine (PROZAC) 40 MG capsule TAKE 1 CAPSULE DAILY 90 capsule 3  . fluticasone (FLONASE) 50 MCG/ACT nasal spray  Place 2 sprays into both nostrils 2 (two) times daily as needed for allergies or rhinitis.    . furosemide (LASIX) 80 MG tablet Take 1 tablet (80 mg total) by mouth 2 (two) times daily.    Marland Kitchen glucosamine-chondroitin 500-400 MG tablet Take 1 tablet by mouth every morning.     Marland Kitchen KLOR-CON M20 20 MEQ tablet Take 1 tablet (20 mEq total) by mouth 2 (two) times daily. Please schedule appointment for refills. 180 tablet 0  . levalbuterol (XOPENEX) 1.25 MG/3ML nebulizer solution Take 1.25 mg by nebulization every 4 (four) hours. 360 mL 11  . levothyroxine (SYNTHROID, LEVOTHROID) 25 MCG tablet TAKE 1 TABLET DAILY BEFORE BREAKFAST 90 tablet 2  . metoprolol tartrate (LOPRESSOR) 25 MG tablet Take 1 tablet (25 mg total) by mouth 2 (two) times daily. 60 tablet 0  . Olopatadine HCl (PATADAY) 0.2 % SOLN Place 1 drop into both eyes daily.    Marland Kitchen omeprazole (PRILOSEC) 20 MG capsule Take 6ms before supper daily    . OXYGEN Inhale 2.5 L/min into the lungs continuous.    .  propafenone (RYTHMOL SR) 325 MG 12 hr capsule Take 1 capsule (325 mg total) by mouth 2 (two) times daily. 180 capsule 3  . senna-docusate (SENOKOT-S) 8.6-50 MG tablet Take 1 tablet by mouth at bedtime as needed for mild constipation. 30 tablet 0  . traMADol (ULTRAM) 50 MG tablet Take 1-2 tablets (50-100 mg total) by mouth every 4 (four) hours as needed. 40 tablet 0   No current facility-administered medications on file prior to visit.    Review of Systems  Constitutional: Negative for other unusual diaphoresis or sweats HENT: Negative for ear discharge or swelling Eyes: Negative for other worsening visual disturbances Respiratory: Negative for stridor or other swelling  Gastrointestinal: Negative for worsening distension or other blood Genitourinary: Negative for retention or other urinary change Musculoskeletal: Negative for other MSK pain or swelling Skin: Negative for color change or other new lesions Neurological: Negative for worsening tremors and other numbness  Psychiatric/Behavioral: Negative for worsening agitation or other fatigue All other system neg epr pt    Objective:   Physical Exam BP 134/76   Pulse (!) 104   Temp 97.6 F (36.4 C) (Oral)   Ht _0  (1.575 m)   Wt 253 lb (114.8 kg)   SpO2 91%   BMI 46.27 kg/m  VS noted, fatigued appearing but non toxic Constitutional: Pt appears in NAD HENT: Head: NCAT.  Right Ear: External ear normal.  Left Ear: External ear normal.  Eyes: . Pupils are equal, round, and reactive to light. Conjunctivae and EOM are normal Nose: without d/c or deformity Neck: Neck supple. Gross normal ROM Cardiovascular: tachycaridc with some irregularity  Pulmonary/Chest: Effort normal and breath sounds decreased without rales or wheezing.  Abd:  Soft, NT, ND, + BS, no organomegaly - benign exam, no flank tender Neurological: Pt is alert. At baseline orientation, motor grossly intact Skin: Skin is warm. No rashes, other new lesions, no LE  edema Psychiatric: Pt behavior is normal without agitation , somewhat dysphoric, mild nervous No other exam findings Lab Results  Component Value Date   WBC 10.4 10/09/2016   HGB 10.6 (L) 10/09/2016   HCT 32.9 (L) 10/09/2016   PLT 226 10/09/2016   GLUCOSE 203 (H) 10/09/2016   CHOL 148 04/11/2015   TRIG 117.0 04/11/2015   HDL 41.20 04/11/2015   LDLDIRECT 140.2 04/10/2010   LDLCALC 84 04/11/2015   ALT 15 10/09/2016  AST 21 10/09/2016   NA 138 10/09/2016   K 3.9 10/09/2016   CL 100 (L) 10/09/2016   CREATININE 0.71 10/09/2016   BUN 12 10/09/2016   CO2 28 10/09/2016   TSH 0.993 09/15/2016   INR 1.15 10/07/2016   HGBA1C 5.4 04/11/2015   ECG today I have personally interpreted Afib with RVR 144 -     Assessment & Plan:

## 2016-10-14 NOTE — Patient Instructions (Signed)
Please go to ED now due to the afib  Please continue all other medications as before, and refills have been done if requested.  Please have the pharmacy call with any other refills you may need.  Please keep your appointments with your specialists as you may have planned

## 2016-10-14 NOTE — Telephone Encounter (Signed)
Spoke with pt, she reports water diarrhea that she has no control over. She reports she had this while in the hospital but did not realize the extent until she got home. She feels it is related to the metoprolol. She also reports nausea. She has black stools because she takes iron daily and has not seen any blood. She has a follow up with her medical doctor today at 2 pm. Advised I will send this message to dr Stanford Breed but to make sure she discusses it also with dr Jenny Reichmann. Pt agreed with this plan. Will forward for dr Stanford Breed review

## 2016-10-14 NOTE — Consult Note (Signed)
Cardiology Consultation:   Patient ID: [name] [name]; [id number]; 24-Dec-1950   Admit date: 10/14/2016 Date of Consult: 10/14/2016  Primary Care Provider: Biagio Borg, MD Primary Cardiologist: Stanford Breed Primary Electrophysiologist:  Lovena Le   Patient Profile:   [name] [name] is a 66 y.o. female with a hx of paroxysmal atrial fibrillation and typical atrial flutter, status post recent redo cavotricuspid isthmus ablation who is being seen today for the evaluation of symptomatic persistent atrial fibrillation at the request of Dr. Johnney Killian.  History of Present Illness:   [name] underwent elective flutter ablation roughly a week ago by Dr. Lovena Le (previous flutter ablation 2010) and did well with the procedure. She has been on uninterrupted anticoagulation. When she turned over in bed last night she suddenly felt her rhythm become irregular and she has been feeling poorly ever since. In addition to palpitations she has worsening shortness of breath. She always has functional class III dyspnea and uses home oxygen, due to COPD. She does not have orthopnea or PND. She has not experienced syncope or new focal neurological complaints.  Her ventricular rate today has been as fast as 165, now her heart rate is in the 110 range at rest and increases to the 120s with minimal activity. She is already taking a relatively high dose of per [place] known as well as metoprolol tartrate. Her systolic blood pressure is around 100 mmHg, and while in the emergency room has been as low as 87.  She has been anticoagulated with Eliquis without interruption since her ablation procedure. She denies any recent bleeding problems.  Additional medical problems include COPD, morbid obesity, history of DVT/pulmonary embolism, treated hypertension and hypothyroidism.  Past Medical History:  Diagnosis Date  . Anemia    hx  . Anxiety   . Arthritis   . Asthma   . Atrial fibrillation and flutter ([place])   . Atypical  lobular hyperplasia of right breast 05/09/2015  . Breast CA ([place])    ?  Marland Kitchen Chronic rhinitis   . COPD (chronic obstructive pulmonary disease) ([place])    [place]. PFTs 07/25/06 FEV1 55% ratio 53, DLC0 51% HFA 50% 12/16/2009  . Depression   . FRACTURE, RIB, RIGHT 06/18/2009  . Glucose intolerance (impaired glucose tolerance)    on steroids  . History of DVT (deep vein thrombosis)   . Hx of cardiac catheterization    LHC (01/2001):  Normal Cors.  EF 65%.;  LexiScan Myoview (03/2013): No ischemia, EF 61%, normal  . Hx of echocardiogram    Echo (11/2011):  Mod LVH, EF 60-65%, no RWMA, MAC, mild LAE, PASP 34.  Marland Kitchen HYPERLIPIDEMIA   . Hypertension   . Hypothyroidism   . Morbid obesity ([place])    target wt=179lb for BMI<30 Peak wt 282lb  . Osteoporosis    last BMD 4/08 -2.4, intolerant of bisphosphonates  . Peripheral vascular disease ([facility])    hx dvt.pe  . Pneumonia    hx  . PONV (postoperative nausea and vomiting)   . PREMATURE VENTRICULAR CONTRACTIONS   . PULMONARY EMBOLISM, HX OF 06/19/2007  . Rhabdomyolysis 07/29/2009  . Tremor   . VITAMIN D DEFICIENCY     Past Surgical History:  Procedure Laterality Date  . A-FLUTTER ABLATION N/A 10/08/2016   Procedure: A-FLUTTER ABLATION;  Surgeon: Evans Lance, MD;  Location: [facility];  Service: Cardiovascular;  Laterality: N/A;  . ABDOMINAL HYSTERECTOMY    . BREAST LUMPECTOMY WITH RADIOACTIVE SEED LOCALIZATION Right 06/05/2015   Procedure:  RIGHT BREAST LUMPECTOMY WITH RADIOACTIVE SEED LOCALIZATION;  Surgeon: Fanny Skates, MD;  Location: [facility];  Service: General;  Laterality: Right;  . BREAST SURGERY Left    s/p mass removal  . CARDIOVERSION N/A 12/11/2014   Procedure: CARDIOVERSION;  Surgeon: Josue Hector, MD;  Location: [facility];  Service: Cardiovascular;  Laterality: N/A;  . CARDIOVERSION N/A 09/15/2016   Procedure: CARDIOVERSION;  Surgeon: Jerline Pain, MD;  Location[facility] ENDOSCOPY;  Service: Cardiovascular;  Laterality: N/A;  .  COLONOSCOPY  10/22/2003, ?2006  . COLONOSCOPY WITH PROPOFOL N/A 01/15/2016   Procedure: COLONOSCOPY WITH PROPOFOL;  Surgeon: Gatha Mayer, MD;  Location: [facility] ENDOSCOPY;  Service: Endoscopy;  Laterality: N/A;  . ESOPHAGOGASTRODUODENOSCOPY (EGD) WITH PROPOFOL N/A 01/15/2016   Procedure: ESOPHAGOGASTRODUODENOSCOPY (EGD) WITH PROPOFOL;  Surgeon: Gatha Mayer, MD;  Location: [facility] ENDOSCOPY;  Service: Endoscopy;  Laterality: N/A;  . s/p left arm fracture with fall off chair    . skin graft to middle R finger  1975  . TEE WITHOUT CARDIOVERSION N/A 12/11/2014   Procedure: TRANSESOPHAGEAL ECHOCARDIOGRAM (TEE);  Surgeon: Josue Hector, MD;  Location: [facility] ENDOSCOPY;  Service: Cardiovascular;  Laterality: N/A;  . TONSILLECTOMY       Home Medications:  Prior to Admission medications   Medication Sig Start Date End Date Taking? Authorizing Provider  acetaminophen (TYLENOL) 650 MG CR tablet Take 650-1,300 mg by mouth as needed for pain. Per bottle as needed   Yes [provider]  albuterol (PROAIR HFA) 108 (90 Base) MCG/ACT inhaler Inhale 2 puffs into the lungs every 6 (six) hours as needed for wheezing or shortness of breath. 09/28/16  Yes Tanda Rockers, MD  atorvastatin (LIPITOR) 10 MG tablet Take 1 tablet (10 mg total) by mouth daily. 07/05/16  Yes Biagio Borg, MD  budesonide-formoterol [facility]) 160-4.5 MCG/ACT inhaler Inhale 2 puffs into the lungs 2 (two) times daily. 12/12/15  Yes Parrett, [name] S, NP  Calcium Carbonate-Vitamin D 600-400 MG-UNIT per tablet Take 1 tablet by mouth 2 (two) times daily.     Yes [provider]  Dextromethorphan-Guaifenesin (MUCINEX DM MAXIMUM STRENGTH) 60-1200 MG TB12 Take 1 tablet by mouth every 12 (twelve) hours as needed (cough/congestion). With flutter   Yes [provider]  ELIQUIS 5 MG TABS tablet TAKE 1 TABLET TWICE A DAY Patient taking differently: TAKE 5 MG  TABLET TWICE A DAY 07/19/16  Yes Lelon Perla, MD  ferrous sulfate 325 (65  FE) MG tablet Take 325 mg by mouth 2 (two) times daily with a meal.    Yes [provider]  FLUoxetine (PROZAC) 40 MG capsule TAKE 1 CAPSULE DAILY Patient taking differently: TAKE 40 mg CAPSULE DAILY 06/22/16  Yes Biagio Borg, MD  fluticasone [facility]) 50 MCG/ACT nasal spray Place 2 sprays into both nostrils 2 (two) times daily as needed for allergies or rhinitis.   Yes [provider]  furosemide (LASIX) 80 MG tablet Take 1 tablet (80 mg total) by mouth 2 (two) times daily. 09/16/16  Yes Hongalgi, Lenis Dickinson, MD  glucosamine-chondroitin 500-400 MG tablet Take 1 tablet by mouth every morning.    Yes [provider]  KLOR-CON M20 20 MEQ tablet Take 1 tablet (20 mEq total) by mouth 2 (two) times daily. Please schedule appointment for refills. 08/04/16  Yes Lelon Perla, MD  levalbuterol Penne Lash) 1.25 MG/3ML nebulizer solution Take 1.25 mg by nebulization every 4 (four) hours. 09/28/16  Yes Tanda Rockers, MD  levothyroxine (SYNTHROID, [place])  25 MCG tablet TAKE 1 TABLET DAILY BEFORE BREAKFAST Patient taking differently: TAKE 25 mg TABLET DAILY BEFORE BREAKFAST 01/22/16  Yes Biagio Borg, MD  metoprolol tartrate (LOPRESSOR) 25 MG tablet Take 1 tablet (25 mg total) by mouth 2 (two) times daily. 10/09/16  Yes Sheikh, Omair Latif, DO  Olopatadine HCl (PATADAY) 0.2 % SOLN Place 1 drop into both eyes daily.   Yes [provider]  omeprazole (PRILOSEC) 20 MG capsule Take 1ms before supper daily 09/16/16  Yes Hongalgi, ALenis Dickinson MD  OXYGEN Inhale 2.5 L/min into the lungs continuous.   Yes [provider]  propafenone (RYTHMOL SR) 325 MG 12 hr capsule Take 1 capsule (325 mg total) by mouth 2 (two) times daily. 11/11/15  Yes CLelon Perla MD  senna-docusate (SENOKOT-S) 8.6-50 MG tablet Take 1 tablet by mouth at bedtime as needed for mild constipation. 10/09/16  Yes Sheikh, Omair Latif, DO  traMADol (ULTRAM) 50 MG tablet Take 1-2 tablets (50-100 mg total) by  mouth every 4 (four) hours as needed. 08/05/16  Yes WTanda Rockers MD    Inpatient Medications: Scheduled Meds:  Continuous Infusions:  PRN Meds:   Allergies:    Allergies  Allergen Reactions  . Duloxetine Other (See Comments)    REACTION: rhabdomyolysis  . Penicillins Other (See Comments)    SYNCOPE Has patient had a PCN reaction causing immediate rash, facial/tongue/throat swelling, SOB or lightheadedness with hypotension: Yes Has patient had a PCN reaction causing severe rash involving mucus membranes or skin necrosis: No Has patient had a PCN reaction that required hospitalization No Has patient had a PCN reaction occurring within the last 10 years: No If all of the above answers are "NO", then may proceed with Cephalosporin use.   . Latex Rash    Social History:   Social History   Social History  . Marital status: Married    Spouse name: N/A  . Number of children: 3  . Years of education: N/A   Occupational History  . 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 Smoking status: Former Smoker    Packs/day: 1.00    Years: 35.00    Types: Cigarettes    Quit date: 01/12/1996  . Smokeless tobacco: Never Used  . Alcohol use No  . Drug use: No  . Sexual activity: Yes   Other Topics Concern  . Not on file   Social History Narrative   Married 1 son 2 daughters   Disabled   2 caffeine/day   Past smoker   11/12/2015       Family History:    Family History  Problem Relation Age of Onset  . Heart disease Mother   . Liver disease Mother        alcohol related  . Asthma Brother   . Esophageal cancer Brother   . Asthma Son   . Colon cancer Neg Hx   . Stomach cancer Neg Hx   . Pancreatic cancer Neg Hx   . Inflammatory bowel disease Neg Hx      ROS:  Please see the history of present illness.  ROS  All other ROS reviewed and negative.     Physical Exam/Data:   Vitals:   10/14/16 1543 10/14/16 1630 10/14/16  1730 10/14/16 1734  BP: 110/66 106/63 (!) 97/56 (!) 97/56  Pulse: (!) 51 65 72 (!) 107  Resp: 20 (!) 21 (!) 24 18  Temp: 98.4 F (36.9 C)  TempSrc: Oral     SpO2: 96% 98% 94% 99%  Weight:      Height:       No intake or output data in the 24 hours ending 10/14/16 1825 Filed Weights   10/14/16 1540  Weight: 253 lb (114.8 kg)   Body mass index is 46.27 kg/m.  General:  Well nourished, well developed, in no acute distress, but clearly uncomfortable, morbidly obese HEENT: normal Lymph: no adenopathy Neck: Unable to evaluate JVD Endocrine:  No thryomegaly Vascular: No carotid bruits; FA pulses 2+ bilaterally without bruits  Cardiac:  normal S1, S2; irregular; no murmur , bounding distal pulses in all 4 extremities Lungs:  clear to auscultation bilaterally, no wheezing, rhonchi or rales  Abd: soft, nontender, no hepatomegaly  Ext: no edema Musculoskeletal:  No deformities, BUE and BLE strength normal and equal Skin: warm and dry  Neuro:  CNs 2-12 intact, no focal abnormalities noted Psych:  Normal affect   EKG:  The EKG was personally reviewed and demonstrates:  Atrial fibrillation with rapid ventricular response, prominent R waves and T-wave inversion in the septal leads suggestive of right ventricular hypertrophy Telemetry:  Telemetry was personally reviewed and demonstrates:  Atrial fibrillation  Relevant CV Studies: Normal coronary arteries at remote catheterization 2003 Normal nuclear stress test 04/06/2013  Echo 2013 - Left ventricle: The cavity size was normal. Wall thickness was increased in a pattern of moderate LVH. Systolic function was normal. The estimated ejection fraction was in the range of 60% to 65%. Wall motion was normal; there were no regional wall motion abnormalities. - Mitral valve: Calcified annulus. - Left atrium: The atrium was mildly dilated. - Pulmonary arteries: PA peak pressure: 33m Hg (S).  09/30/2016 atrial flutter  ablation CONCLUSIONS:  1. Isthmus-dependent right atrial flutter upon presentation.  2. Successful radiofrequency ablation of atrial flutter along the cavotricuspid isthmus with complete bidirectional isthmus block achieved.  3. No inducible arrhythmias following ablation.  4. No early apparent complications.    Laboratory Data:  Chemistry Recent Labs Lab 10/08/16 0302 10/09/16 0959 10/14/16 1543  NA 140 138 139  K 3.6 3.9 3.4*  CL 101 100* 98*  CO2 32 28 32  GLUCOSE 164* 203* 133*  BUN _0 CREATININE 0.75 0.71 0.85  CALCIUM 8.4* 8.8* 8.5*  GFRNONAA >60 >60 >60  GFRAA >60 >60 >60  ANIONGAP _1 Recent Labs Lab 10/08/16 0302 10/09/16 0959  PROT 5.5* 6.1*  ALBUMIN 3.1* 3.4*  AST 13* 21  ALT 12* 15  ALKPHOS 68 71  BILITOT 0.9 1.1   Hematology Recent Labs Lab 10/08/16 0302 10/09/16 0959 10/14/16 1543  WBC 7.8 10.4 7.1  RBC 3.57* 3.52* 3.27*  HGB 10.8* 10.6* 9.7*  HCT 32.9* 32.9* 30.2*  MCV 92.2 93.5 92.4  MCH 30.3 30.1 29.7  MCHC 32.8 32.2 32.1  RDW 16.1* 16.6* 16.3*  PLT 207 226 280   Cardiac EnzymesNo results for input(s): TROPONINI in the last 168 hours.  Recent Labs Lab 10/14/16 1551  TROPIPOC 0.00    BNPNo results for input(s): BNP, PROBNP in the last 168 hours.  DDimer No results for input(s): DDIMER in the last 168 hours.  Radiology/Studies:  Dg Chest 2 View  Result Date: 10/14/2016 CLINICAL DATA:  Pt c/o left-sided chest pain, SOB, dry cough, and congestion x 1 day. Hx of asthma, AFIB, COPD, cardiac catheterization, HTN, PNA, AND PE. Pt is a former smoker. EXAM: CHEST  2 VIEW COMPARISON:  10/07/2016 FINDINGS: The heart is mildly enlarged. There are mild interstitial changes compatible with interstitial edema. No focal consolidations or overt edema. Small effusions are present. IMPRESSION: Cardiomegaly and mild pulmonary edema. Electronically Signed   By: Nolon Nations M.D.   On: 10/14/2016 16:11    Assessment and Plan:    1. Atrial fibrillation: Symptomatic, persistent, poorly rate controlled. Already on propafenone and metoprolol and unable to tolerate higher doses due to hypotension. Will benefit from early cardioversion. This plan was already discussed with the patient, her electrophysiologist Dr. Lovena Le and the emergency room staff Dr. Johnney Killian. If cardioversion is successful and uncomplicated she would be able to return home today, continuing anticoagulation in the same dose of per puff and known and metoprolol. Would then have follow-up with Dr. Lovena Le to discuss any potential changes and antiarrhythmic therapy. However, this is not an unexpected complication so soon after an ablation procedure. The electrical cardioversion procedure has been fully reviewed with the patient and written informed consent has been obtained. 2. Morbid obesity: Increases the risk of recurrent atrial tachyarrhythmia as well as a risk of complications with sedation. 3. COPD with chronic hypoxic/hypercapnic respiratory failure: Also increases the likelihood of recurrent arrhythmia. 4. DVT/PE: On uninterrupted anticoagulation for this as well.   For questions or updates, please contact [place] Please consult [email or web address] for contact info under Cardiology/STEMI.   Signed, Sanda Klein, MD  10/14/2016 6:25 PM  ADDENDUM: Successful cardioversion.  Felt better immediately afterwards. NSR 64 bpm and BP increased to 130/80 mm Hg. Will arrange f/u in AFib clinic next week.  Sanda Klein, MD, [facility] [phone] office [phone] pager 7:05 PM

## 2016-10-14 NOTE — Telephone Encounter (Signed)
Agree with reviewing with Dr Jenny Reichmann; if diarrhea persists, can then DC metoprolol to see if improves. Kirk Ruths

## 2016-10-14 NOTE — Assessment & Plan Note (Signed)
Suspect overcontrolled given recent diarrhea and lasix use, with fatigue and dizzy - will ask pt to hold on taking lasix until Mon oct 8

## 2016-10-14 NOTE — Assessment & Plan Note (Signed)
Ow stable,  to f/u any worsening symptoms or concerns ?

## 2016-10-14 NOTE — Sedation Documentation (Signed)
10mg  of etomidate administered

## 2016-10-14 NOTE — CV Procedure (Signed)
Procedure: Electrical Cardioversion Indications:  Atrial Fibrillation  Procedure Details:  Consent: Risks of procedure as well as the alternatives and risks of each were explained to the (patient/caregiver).  Consent for procedure obtained.  Time Out: Verified patient identification, verified procedure, site/side was marked, verified correct patient position, special equipment/implants available, medications/allergies/relevent history reviewed, required imaging and test results available.  Performed  Patient placed on cardiac monitor, pulse oximetry, supplemental oxygen as necessary.  Sedation given: Etomidate, Dr. Johnney Killian Pacer pads placed anterior and posterior chest.  Cardioverted 1 time(s).  Cardioversion with synchronized biphasic 200J shock.  Evaluation: Findings: Post procedure EKG shows: NSR Complications: None Patient did tolerate procedure well.  Time Spent Directly with the Patient:  30 minutes   [name] [name] 10/14/2016, 7:03 PM

## 2016-10-14 NOTE — Assessment & Plan Note (Signed)
See afib with RVR above

## 2016-10-14 NOTE — ED Triage Notes (Signed)
Per Pt, pt complaining of increased heart rate associated with their A-fib. Her HR started to increase around 8pm last night. Pt has history of CHF. Pt is experiencing SOB. Pt denies chest pain at this time.

## 2016-10-14 NOTE — Sedation Documentation (Signed)
Synchronized Shock delivered at 200j.

## 2016-10-15 ENCOUNTER — Telehealth (HOSPITAL_COMMUNITY): Payer: Self-pay | Admitting: *Deleted

## 2016-10-15 NOTE — Telephone Encounter (Signed)
I cld pt to offer a follow up appt  Since cardioverted in the ED.  PT declined appt since she has so many appts coming up.  Pt was given our number and told to call for sooner f/u if she should change her mind

## 2016-10-19 ENCOUNTER — Ambulatory Visit (INDEPENDENT_AMBULATORY_CARE_PROVIDER_SITE_OTHER): Payer: BLUE CROSS/BLUE SHIELD | Admitting: Internal Medicine

## 2016-10-19 ENCOUNTER — Encounter: Payer: Self-pay | Admitting: Internal Medicine

## 2016-10-19 VITALS — BP 104/76 | HR 71 | Temp 98.0°F | Ht 62.0 in | Wt 257.0 lb

## 2016-10-19 DIAGNOSIS — R7302 Impaired glucose tolerance (oral): Secondary | ICD-10-CM

## 2016-10-19 DIAGNOSIS — I480 Paroxysmal atrial fibrillation: Secondary | ICD-10-CM | POA: Diagnosis not present

## 2016-10-19 DIAGNOSIS — I10 Essential (primary) hypertension: Secondary | ICD-10-CM | POA: Diagnosis not present

## 2016-10-19 DIAGNOSIS — E876 Hypokalemia: Secondary | ICD-10-CM | POA: Diagnosis not present

## 2016-10-19 NOTE — Assessment & Plan Note (Signed)
Mild low now being replaced at short term relatively high dose K, will need f/u K q Monday starting oct 15 x 3

## 2016-10-19 NOTE — Assessment & Plan Note (Signed)
Symptomatically improved, overall feels much better stamina, less fatigue, and more mobility, to cont same tx, f/u cardiology as planned

## 2016-10-19 NOTE — Assessment & Plan Note (Signed)
stable overall by history and exam, recent data reviewed with pt, and pt to continue medical treatment as before,  to f/u any worsening symptoms or concerns Lab Results  Component Value Date   HGBA1C 5.4 04/11/2015

## 2016-10-19 NOTE — Progress Notes (Signed)
Subjective:    Patient ID: [name] [name], female    DOB: March 18, 1950, 66 y.o.   MRN: [id number]  HPI  Here to f/u recent ED visit 10/4 with afib with RVR post ablation which required cardioversion.  Since that time Pt denies chest pain, increased sob or doe, wheezing, orthopnea, PND, increased LE swelling, palpitations, dizziness or syncope.  Has been taking increased K supplement total 60 meq per day for 10 days (completed 5 days now), with plan to return to 20 bid after, and will need relatively close f/u K monitoring.  Pt denies polydipsia, polyuria. Past Medical History:  Diagnosis Date  . Anemia    hx  . Anxiety   . Arthritis   . Asthma   . Atrial fibrillation and flutter ([place])   . Atypical lobular hyperplasia of right breast 05/09/2015  . Breast CA ([place])    ?  Marland Kitchen Chronic rhinitis   . COPD (chronic obstructive pulmonary disease) ([place])    [place]. PFTs 07/25/06 FEV1 55% ratio 53, DLC0 51% HFA 50% 12/16/2009  . Depression   . FRACTURE, RIB, RIGHT 06/18/2009  . Glucose intolerance (impaired glucose tolerance)    on steroids  . History of DVT (deep vein thrombosis)   . Hx of cardiac catheterization    LHC (01/2001):  Normal Cors.  EF 65%.;  LexiScan Myoview (03/2013): No ischemia, EF 61%, normal  . Hx of echocardiogram    Echo (11/2011):  Mod LVH, EF 60-65%, no RWMA, MAC, mild LAE, PASP 34.  Marland Kitchen HYPERLIPIDEMIA   . Hypertension   . Hypothyroidism   . Morbid obesity ([place])    target wt=179lb for BMI<30 Peak wt 282lb  . Osteoporosis    last BMD 4/08 -2.4, intolerant of bisphosphonates  . Peripheral vascular disease (HCC)    hx dvt.pe  . Pneumonia    hx  . PONV (postoperative nausea and vomiting)   . PREMATURE VENTRICULAR CONTRACTIONS   . PULMONARY EMBOLISM, HX OF 06/19/2007  . Rhabdomyolysis 07/29/2009  . Tremor   . VITAMIN D DEFICIENCY    Past Surgical History:  Procedure Laterality Date  . A-FLUTTER ABLATION N/A 10/08/2016   Procedure: A-FLUTTER ABLATION;  Surgeon: Evans Lance,  MD;  Location: [place] [facility];  Service: Cardiovascular;  Laterality: N/A;  . ABDOMINAL HYSTERECTOMY    . BREAST LUMPECTOMY WITH RADIOACTIVE SEED LOCALIZATION Right 06/05/2015   Procedure: RIGHT BREAST LUMPECTOMY WITH RADIOACTIVE SEED LOCALIZATION;  Surgeon: Fanny Skates, MD;  Location: [place];  Service: General;  Laterality: Right;  . BREAST SURGERY Left    s/p mass removal  . CARDIOVERSION N/A 12/11/2014   Procedure: CARDIOVERSION;  Surgeon: Josue Hector, MD;  Location: [place];  Service: Cardiovascular;  Laterality: N/A;  . CARDIOVERSION N/A 09/15/2016   Procedure: CARDIOVERSION;  Surgeon: Jerline Pain, MD;  Location: [facility] ENDOSCOPY;  Service: Cardiovascular;  Laterality: N/A;  . COLONOSCOPY  10/22/2003, ?2006  . COLONOSCOPY WITH PROPOFOL N/A 01/15/2016   Procedure: COLONOSCOPY WITH PROPOFOL;  Surgeon: Gatha Mayer, MD;  Location: [facility]OS[facility];  Service: Endoscopy;  Laterality: N/A;  . ESOPHAGOGASTRODUODENOSCOPY (EGD) WITH PROPOFOL N/A 01/15/2016   Procedure: ESOPHAGOGASTRODUODENOSCOPY (EGD) WITH PROPOFOL;  Surgeon: Gatha Mayer, MD;  Location: [facility];  Service: Endoscopy;  Laterality: N/A;  . s/p left arm fracture with fall off chair    . skin graft to middle R finger  1975  . TEE WITHOUT CARDIOVERSION N/A 12/11/2014   Procedure: TRANSESOPHAGEAL ECHOCARDIOGRAM (TEE);  Surgeon: Wallis Bamberg  Johnsie Cancel, MD;  Location: [place] ENDOSCOPY;  Service: Cardiovascular;  Laterality: N/A;  . TONSILLECTOMY      reports that she quit smoking about 20 years ago. Her smoking use included Cigarettes. She has a 35.00 pack-year smoking history. She has never used smokeless tobacco. She reports that she does not drink alcohol or use drugs. family history includes Asthma in her brother and son; Esophageal cancer in her brother; Heart disease in her mother; Liver disease in her mother. Allergies  Allergen Reactions  . Duloxetine Other (See Comments)    REACTION: rhabdomyolysis  . Penicillins Other (See  Comments)    SYNCOPE Has patient had a PCN reaction causing immediate rash, facial/tongue/throat swelling, SOB or lightheadedness with hypotension: Yes Has patient had a PCN reaction causing severe rash involving mucus membranes or skin necrosis: No Has patient had a PCN reaction that required hospitalization No Has patient had a PCN reaction occurring within the last 10 years: No If all of the above answers are "NO", then may proceed with Cephalosporin use.   . Latex Rash   Current Outpatient Prescriptions on File Prior to Visit  Medication Sig Dispense Refill  . acetaminophen (TYLENOL) 650 MG CR tablet Take 650-1,300 mg by mouth as needed for pain. Per bottle as needed    . albuterol (PROAIR HFA) 108 (90 Base) MCG/ACT inhaler Inhale 2 puffs into the lungs every 6 (six) hours as needed for wheezing or shortness of breath. 3 Inhaler 3  . atorvastatin (LIPITOR) 10 MG tablet Take 1 tablet (10 mg total) by mouth daily. 90 tablet 2  . budesonide-formoterol (SYMBICORT) 160-4.5 MCG/ACT inhaler Inhale 2 puffs into the lungs 2 (two) times daily. 3 Inhaler 2  . Calcium Carbonate-Vitamin D 600-400 MG-UNIT per tablet Take 1 tablet by mouth 2 (two) times daily.      Marland Kitchen Dextromethorphan-Guaifenesin (MUCINEX DM MAXIMUM STRENGTH) 60-1200 MG TB12 Take 1 tablet by mouth every 12 (twelve) hours as needed (cough/congestion). With flutter    . ELIQUIS 5 MG TABS tablet TAKE 1 TABLET TWICE A DAY (Patient taking differently: TAKE 5 MG  TABLET TWICE A DAY) 180 tablet 0  . ferrous sulfate 325 (65 FE) MG tablet Take 325 mg by mouth 2 (two) times daily with a meal.     . FLUoxetine (PROZAC) 40 MG capsule TAKE 1 CAPSULE DAILY (Patient taking differently: TAKE 40 mg CAPSULE DAILY) 90 capsule 3  . fluticasone (FLONASE) 50 MCG/ACT nasal spray Place 2 sprays into both nostrils 2 (two) times daily as needed for allergies or rhinitis.    . furosemide (LASIX) 80 MG tablet Take 1 tablet (80 mg total) by mouth 2 (two) times daily.     Marland Kitchen glucosamine-chondroitin 500-400 MG tablet Take 1 tablet by mouth every morning.     Marland Kitchen KLOR-CON M20 20 MEQ tablet Take 1 tablet (20 mEq total) by mouth 2 (two) times daily. Please schedule appointment for refills. 180 tablet 0  . levalbuterol (XOPENEX) 1.25 MG/3ML nebulizer solution Take 1.25 mg by nebulization every 4 (four) hours. 360 mL 11  . levothyroxine (SYNTHROID, LEVOTHROID) 25 MCG tablet TAKE 1 TABLET DAILY BEFORE BREAKFAST (Patient taking differently: TAKE 25 mg TABLET DAILY BEFORE BREAKFAST) 90 tablet 2  . metoprolol tartrate (LOPRESSOR) 25 MG tablet Take 1 tablet (25 mg total) by mouth 2 (two) times daily. 60 tablet 0  . Olopatadine HCl (PATADAY) 0.2 % SOLN Place 1 drop into both eyes daily.    Marland Kitchen omeprazole (PRILOSEC) 20 MG capsule Take 57ms before  supper daily    . OXYGEN Inhale 2.5 L/min into the lungs continuous.    . propafenone (RYTHMOL SR) 325 MG 12 hr capsule Take 1 capsule (325 mg total) by mouth 2 (two) times daily. 180 capsule 3  . senna-docusate (SENOKOT-S) 8.6-50 MG tablet Take 1 tablet by mouth at bedtime as needed for mild constipation. 30 tablet 0  . traMADol (ULTRAM) 50 MG tablet Take 1-2 tablets (50-100 mg total) by mouth every 4 (four) hours as needed. 40 tablet 0   No current facility-administered medications on file prior to visit.    Review of Systems  Constitutional: Negative for other unusual diaphoresis or sweats HENT: Negative for ear discharge or swelling Eyes: Negative for other worsening visual disturbances Respiratory: Negative for stridor or other swelling  Gastrointestinal: Negative for worsening distension or other blood Genitourinary: Negative for retention or other urinary change Musculoskeletal: Negative for other MSK pain or swelling Skin: Negative for color change or other new lesions Neurological: Negative for worsening tremors and other numbness  Psychiatric/Behavioral: Negative for worsening agitation or other fatigue All other system  neg per pt    Objective:   Physical Exam BP 104/76   Pulse 71   Temp 98 F (36.7 C) (Oral)   Ht _0  (1.575 m)   Wt 257 lb (116.6 kg)   SpO2 94%   BMI 47.01 kg/m  VS noted,  Constitutional: Pt appears in NAD HENT: Head: NCAT.  Right Ear: External ear normal.  Left Ear: External ear normal.  Eyes: . Pupils are equal, round, and reactive to light. Conjunctivae and EOM are normal Nose: without d/c or deformity Neck: Neck supple. Gross normal ROM Cardiovascular: Normal rate and regular rhythm.   Pulmonary/Chest: Effort normal and breath sounds decreased without rales or wheezing.  Neurological: Pt is alert. At baseline orientation, motor grossly intact Skin: Skin is warm. No rashes, other new lesions, no LE edema Psychiatric: Pt behavior is normal without agitation  No other exam findings Lab Results  Component Value Date   WBC 7.1 10/14/2016   HGB 9.7 (L) 10/14/2016   HCT 30.2 (L) 10/14/2016   PLT 280 10/14/2016   GLUCOSE 133 (H) 10/14/2016   CHOL 148 04/11/2015   TRIG 117.0 04/11/2015   HDL 41.20 04/11/2015   LDLDIRECT 140.2 04/10/2010   LDLCALC 84 04/11/2015   ALT 15 10/09/2016   AST 21 10/09/2016   NA 139 10/14/2016   K 3.4 (L) 10/14/2016   CL 98 (L) 10/14/2016   CREATININE 0.85 10/14/2016   BUN 9 10/14/2016   CO2 32 10/14/2016   TSH 0.993 09/15/2016   INR 1.15 10/07/2016   HGBA1C 5.4 04/11/2015      Assessment & Plan:

## 2016-10-19 NOTE — Assessment & Plan Note (Signed)
stable overall by history and exam, recent data reviewed with pt, and pt to continue medical treatment as before,  to f/u any worsening symptoms or concerns BP Readings from Last 3 Encounters:  10/19/16 104/76  10/14/16 110/67  10/14/16 134/76

## 2016-10-19 NOTE — Patient Instructions (Addendum)
OK to finish the whole 10 days of the potassium pills at 1 and 1/2 twice per day (then go back to 1 pill twice per day)  Please return next Monday oct 15 for potassium check, and repeat every 2 wks after that for at least 2 more times  OK to cancel the Oct 17 appt  Please continue all other medications as before, and refills have been done if requested.  Please have the pharmacy call with any other refills you may need.  Please keep your appointments with your specialists as you may have planned  Please return in 3 months, or sooner if needed

## 2016-10-26 ENCOUNTER — Ambulatory Visit (HOSPITAL_COMMUNITY)
Admission: RE | Admit: 2016-10-26 | Discharge: 2016-10-26 | Disposition: A | Discharge status: Home / Self Care | Payer: BLUE CROSS/BLUE SHIELD | Source: Ambulatory Visit | Attending: Nurse Practitioner | Admitting: Nurse Practitioner

## 2016-10-26 ENCOUNTER — Encounter (HOSPITAL_COMMUNITY): Payer: Self-pay | Admitting: Nurse Practitioner

## 2016-10-26 VITALS — BP 118/62 | HR 67 | Ht 62.0 in | Wt 260.6 lb

## 2016-10-26 DIAGNOSIS — Z825 Family history of asthma and other chronic lower respiratory diseases: Secondary | ICD-10-CM | POA: Diagnosis not present

## 2016-10-26 DIAGNOSIS — J449 Chronic obstructive pulmonary disease, unspecified: Secondary | ICD-10-CM | POA: Insufficient documentation

## 2016-10-26 DIAGNOSIS — I739 Peripheral vascular disease, unspecified: Secondary | ICD-10-CM | POA: Diagnosis not present

## 2016-10-26 DIAGNOSIS — I480 Paroxysmal atrial fibrillation: Secondary | ICD-10-CM | POA: Insufficient documentation

## 2016-10-26 DIAGNOSIS — E039 Hypothyroidism, unspecified: Secondary | ICD-10-CM | POA: Insufficient documentation

## 2016-10-26 DIAGNOSIS — Z888 Allergy status to other drugs, medicaments and biological substances status: Secondary | ICD-10-CM | POA: Insufficient documentation

## 2016-10-26 DIAGNOSIS — Z7902 Long term (current) use of antithrombotics/antiplatelets: Secondary | ICD-10-CM | POA: Diagnosis not present

## 2016-10-26 DIAGNOSIS — F329 Major depressive disorder, single episode, unspecified: Secondary | ICD-10-CM | POA: Diagnosis not present

## 2016-10-26 DIAGNOSIS — M810 Age-related osteoporosis without current pathological fracture: Secondary | ICD-10-CM | POA: Insufficient documentation

## 2016-10-26 DIAGNOSIS — Z9889 Other specified postprocedural states: Secondary | ICD-10-CM | POA: Insufficient documentation

## 2016-10-26 DIAGNOSIS — Z880 Allergy status to penicillin: Secondary | ICD-10-CM | POA: Insufficient documentation

## 2016-10-26 DIAGNOSIS — E559 Vitamin D deficiency, unspecified: Secondary | ICD-10-CM | POA: Insufficient documentation

## 2016-10-26 DIAGNOSIS — Z90710 Acquired absence of both cervix and uterus: Secondary | ICD-10-CM | POA: Diagnosis not present

## 2016-10-26 DIAGNOSIS — Z86718 Personal history of other venous thrombosis and embolism: Secondary | ICD-10-CM | POA: Diagnosis not present

## 2016-10-26 DIAGNOSIS — M1990 Unspecified osteoarthritis, unspecified site: Secondary | ICD-10-CM | POA: Insufficient documentation

## 2016-10-26 DIAGNOSIS — Z86711 Personal history of pulmonary embolism: Secondary | ICD-10-CM | POA: Diagnosis not present

## 2016-10-26 DIAGNOSIS — Z87891 Personal history of nicotine dependence: Secondary | ICD-10-CM | POA: Diagnosis not present

## 2016-10-26 DIAGNOSIS — F419 Anxiety disorder, unspecified: Secondary | ICD-10-CM | POA: Insufficient documentation

## 2016-10-26 DIAGNOSIS — Z7901 Long term (current) use of anticoagulants: Secondary | ICD-10-CM | POA: Insufficient documentation

## 2016-10-26 DIAGNOSIS — Z853 Personal history of malignant neoplasm of breast: Secondary | ICD-10-CM | POA: Diagnosis not present

## 2016-10-26 DIAGNOSIS — I10 Essential (primary) hypertension: Secondary | ICD-10-CM | POA: Insufficient documentation

## 2016-10-26 DIAGNOSIS — E785 Hyperlipidemia, unspecified: Secondary | ICD-10-CM | POA: Insufficient documentation

## 2016-10-26 DIAGNOSIS — Z800 Family history of malignant neoplasm of digestive organs: Secondary | ICD-10-CM | POA: Insufficient documentation

## 2016-10-26 DIAGNOSIS — Z8249 Family history of ischemic heart disease and other diseases of the circulatory system: Secondary | ICD-10-CM | POA: Insufficient documentation

## 2016-10-26 DIAGNOSIS — Z8379 Family history of other diseases of the digestive system: Secondary | ICD-10-CM | POA: Insufficient documentation

## 2016-10-26 LAB — COMPREHENSIVE METABOLIC PANEL
ALT: 12 U/L — ABNORMAL LOW (ref 14–54)
AST: 14 U/L — ABNORMAL LOW (ref 15–41)
Albumin: 3.3 g/dL — ABNORMAL LOW (ref 3.5–5.0)
Alkaline Phosphatase: 81 U/L (ref 38–126)
Anion gap: 6 (ref 5–15)
BUN: 13 mg/dL (ref 6–20)
CO2: 34 mmol/L — ABNORMAL HIGH (ref 22–32)
Calcium: 8.5 mg/dL — ABNORMAL LOW (ref 8.9–10.3)
Chloride: 102 mmol/L (ref 101–111)
Creatinine, Ser: 0.74 mg/dL (ref 0.44–1.00)
GFR calc Af Amer: >60 mL/min (ref >60)
GFR calc non Af Amer: >60 mL/min (ref >60)
Glucose, Bld: 152 mg/dL — ABNORMAL HIGH (ref 65–99)
Potassium: 3.7 mmol/L (ref 3.5–5.1)
Sodium: 142 mmol/L (ref 135–145)
Total Bilirubin: 0.9 mg/dL (ref 0.3–1.2)
Total Protein: 6.1 g/dL — ABNORMAL LOW (ref 6.5–8.1)

## 2016-10-27 ENCOUNTER — Ambulatory Visit: Payer: BLUE CROSS/BLUE SHIELD | Admitting: Internal Medicine

## 2016-10-27 ENCOUNTER — Encounter: Payer: Self-pay | Admitting: Internal Medicine

## 2016-10-27 ENCOUNTER — Ambulatory Visit (INDEPENDENT_AMBULATORY_CARE_PROVIDER_SITE_OTHER): Payer: BLUE CROSS/BLUE SHIELD | Admitting: Internal Medicine

## 2016-10-27 VITALS — BP 128/74 | HR 70 | Ht 62.0 in | Wt 260.0 lb

## 2016-10-27 DIAGNOSIS — J9612 Chronic respiratory failure with hypercapnia: Secondary | ICD-10-CM

## 2016-10-27 DIAGNOSIS — J9611 Chronic respiratory failure with hypoxia: Secondary | ICD-10-CM

## 2016-10-27 DIAGNOSIS — J449 Chronic obstructive pulmonary disease, unspecified: Secondary | ICD-10-CM | POA: Diagnosis not present

## 2016-10-27 NOTE — Progress Notes (Signed)
Primary Care Physician: Biagio Borg, MD Referring Physician: [facility] ER f/u   [name] [name] is a 66 y.o. female with a h/o COPD, Asthma, HTN,underwent elective flutter ablation roughly a week ago by Dr. Lovena Le (previous flutter ablation 2010) and did well with the procedure. She has been on uninterrupted anticoagulation. When she turned over in bed prior to ER visit, she suddenly felt her rhythm become irregular and she has been feeling poorly ever since. In addition to palpitations she has worsening shortness of breath. She always has functional class III dyspnea and uses home oxygen, due to COPD. She does not have orthopnea or PND. She has not experienced syncope or new focal neurological complaints.  Her ventricular rate in the ER, 10/4, has been as fast as 165, now her heart rate is in the 110 range at rest and increases to the 120s with minimal activity. She is already taking a relatively high dose of per [place] known as well as metoprolol tartrate. Her systolic blood pressure is around 100 mmHg, and while in the emergency room has been as low as 87. It was decided that pt would be cardioverted which was successful. She is now in the  Afib clinic for  F/u. She has continued in SR. She is on propafenone and metoprolol.  She is on continuous O2 via [place]. Husband states that she does snore, was suppose to have sleep study which never got scheduled. Last TTE was in  2013.Continues on eliquis for chadsvasc score of at least 5.  Today, she denies symptoms of palpitations, chest pain, shortness of breath, orthopnea, PND, lower extremity edema, dizziness, presyncope, syncope, or neurologic sequela. The patient is tolerating medications without difficulties and is otherwise without complaint today.   Past Medical History:  Diagnosis Date  . Anemia    hx  . Anxiety   . Arthritis   . Asthma   . Atrial fibrillation and flutter ([place])   . Atypical lobular hyperplasia of right breast 05/09/2015  . Breast  CA ([place])    ?  Marland Kitchen Chronic rhinitis   . COPD (chronic obstructive pulmonary disease) ([place])    [place]. PFTs 07/25/06 FEV1 55% ratio 53, DLC0 51% HFA 50% 12/16/2009  . Depression   . FRACTURE, RIB, RIGHT 06/18/2009  . Glucose intolerance (impaired glucose tolerance)    on steroids  . History of DVT (deep vein thrombosis)   . Hx of cardiac catheterization    LHC (01/2001):  Normal Cors.  EF 65%.;  LexiScan Myoview (03/2013): No ischemia, EF 61%, normal  . Hx of echocardiogram    Echo (11/2011):  Mod LVH, EF 60-65%, no RWMA, MAC, mild LAE, PASP 34.  Marland Kitchen HYPERLIPIDEMIA   . Hypertension   . Hypothyroidism   . Morbid obesity ([place])    target wt=179lb for BMI<30 Peak wt 282lb  . Osteoporosis    last BMD 4/08 -2.4, intolerant of bisphosphonates  . Peripheral vascular disease (HCC)    hx dvt.pe  . Pneumonia    hx  . PONV (postoperative nausea and vomiting)   . PREMATURE VENTRICULAR CONTRACTIONS   . PULMONARY EMBOLISM, HX OF 06/19/2007  . Rhabdomyolysis 07/29/2009  . Tremor   . VITAMIN D DEFICIENCY    Past Surgical History:  Procedure Laterality Date  . A-FLUTTER ABLATION N/A 10/08/2016   Procedure: A-FLUTTER ABLATION;  Surgeon: Evans Lance, MD;  Location: [place] [facility];  Service: Cardiovascular;  Laterality: N/A;  . ABDOMINAL HYSTERECTOMY    . BREAST  LUMPECTOMY WITH RADIOACTIVE SEED LOCALIZATION Right 06/05/2015   Procedure: RIGHT BREAST LUMPECTOMY WITH RADIOACTIVE SEED LOCALIZATION;  Surgeon: Fanny Skates, MD;  Location: [place];  Service: General;  Laterality: Right;  . BREAST SURGERY Left    s/p mass removal  . CARDIOVERSION N/A 12/11/2014   Procedure: CARDIOVERSION;  Surgeon: Josue Hector, MD;  Location: [place];  Service: Cardiovascular;  Laterality: N/A;  . CARDIOVERSION N/A 09/15/2016   Procedure: CARDIOVERSION;  Surgeon: Jerline Pain, MD;  Location: [facility] ENDOSCOPY;  Service: Cardiovascular;  Laterality: N/A;  . COLONOSCOPY  10/22/2003, ?2006  . COLONOSCOPY WITH PROPOFOL N/A  01/15/2016   Procedure: COLONOSCOPY WITH PROPOFOL;  Surgeon: Gatha Mayer, MD;  [facility];  Service: Endoscopy;  Laterality: N/A;  . ESOPHAGOGASTRODUODENOSCOPY (EGD) WITH PROPOFOL N/A 01/15/2016   Procedure: ESOPHAGOGASTRODUODENOSCOPY (EGD) WITH PROPOFOL;  Surgeon: Gatha Mayer, MD;  Location[facility];  Service: Endoscopy;  Laterality: N/A;  . s/p left arm fracture with fall off chair    . skin graft to middle R finger  1975  . TEE WITHOUT CARDIOVERSION N/A 12/11/2014   Procedure: TRANSESOPHAGEAL ECHOCARDIOGRAM (TEE);  Surgeon: Josue Hector, MD;  Location: [facility] ENDOSCOPY;  Service: Cardiovascular;  Laterality: N/A;  . TONSILLECTOMY      Current Outpatient Prescriptions  Medication Sig Dispense Refill  . acetaminophen (TYLENOL) 650 MG CR tablet Take 650-1,300 mg by mouth as needed for pain. Per bottle as needed    . albuterol (PROAIR HFA) 108 (90 Base) MCG/ACT inhaler Inhale 2 puffs into the lungs every 6 (six) hours as needed for wheezing or shortness of breath. 3 Inhaler 3  . atorvastatin (LIPITOR) 10 MG tablet Take 1 tablet (10 mg total) by mouth daily. 90 tablet 2  . budesonide-formoterol (SYMBICORT) 160-4.5 MCG/ACT inhaler Inhale 2 puffs into the lungs 2 (two) times daily. 3 Inhaler 2  . Calcium Carbonate-Vitamin D 600-400 MG-UNIT per tablet Take 1 tablet by mouth 2 (two) times daily.      Marland Kitchen ELIQUIS 5 MG TABS tablet TAKE 1 TABLET TWICE A DAY (Patient taking differently: TAKE 5 MG  TABLET TWICE A DAY) 180 tablet 0  . ferrous sulfate 325 (65 FE) MG tablet Take 325 mg by mouth 2 (two) times daily with a meal.     . FLUoxetine (PROZAC) 40 MG capsule TAKE 1 CAPSULE DAILY (Patient taking differently: TAKE 40 mg CAPSULE DAILY) 90 capsule 3  . fluticasone (FLONASE) 50 MCG/ACT nasal spray Place 2 sprays into both nostrils 2 (two) times daily as needed for allergies or rhinitis.    . furosemide (LASIX) 80 MG tablet Take 1 tablet (80 mg total) by mouth 2 (two) times daily.    Marland Kitchen  glucosamine-chondroitin 500-400 MG tablet Take 1 tablet by mouth every morning.     Marland Kitchen KLOR-CON M20 20 MEQ tablet Take 1 tablet (20 mEq total) by mouth 2 (two) times daily. Please schedule appointment for refills. 180 tablet 0  . levalbuterol (XOPENEX) 1.25 MG/3ML nebulizer solution Take 1.25 mg by nebulization every 4 (four) hours. 360 mL 11  . levothyroxine (SYNTHROID, LEVOTHROID) 25 MCG tablet TAKE 1 TABLET DAILY BEFORE BREAKFAST (Patient taking differently: TAKE 25 mg TABLET DAILY BEFORE BREAKFAST) 90 tablet 2  . metoprolol tartrate (LOPRESSOR) 25 MG tablet Take 1 tablet (25 mg total) by mouth 2 (two) times daily. 60 tablet 0  . Olopatadine HCl (PATADAY) 0.2 % SOLN Place 1 drop into both eyes daily.    Marland Kitchen omeprazole (PRILOSEC) 20 MG capsule  Take 92ms before supper daily    . OXYGEN Inhale 2.5 L/min into the lungs continuous.    . propafenone (RYTHMOL SR) 325 MG 12 hr capsule Take 1 capsule (325 mg total) by mouth 2 (two) times daily. 180 capsule 3  . senna-docusate (SENOKOT-S) 8.6-50 MG tablet Take 1 tablet by mouth at bedtime as needed for mild constipation. 30 tablet 0  . traMADol (ULTRAM) 50 MG tablet Take 1-2 tablets (50-100 mg total) by mouth every 4 (four) hours as needed. 40 tablet 0  . Dextromethorphan-Guaifenesin (MUCINEX DM MAXIMUM STRENGTH) 60-1200 MG TB12 Take 1 tablet by mouth every 12 (twelve) hours as needed (cough/congestion). With flutter     No current facility-administered medications for this encounter.     Allergies  Allergen Reactions  . Duloxetine Other (See Comments)    REACTION: rhabdomyolysis  . Penicillins Other (See Comments)    SYNCOPE Has patient had a PCN reaction causing immediate rash, facial/tongue/throat swelling, SOB or lightheadedness with hypotension: Yes Has patient had a PCN reaction causing severe rash involving mucus membranes or skin necrosis: No Has patient had a PCN reaction that required hospitalization No Has patient had a PCN reaction  occurring within the last 10 years: No If all of the above answers are "NO", then may proceed with Cephalosporin use.   . Latex Rash    Social History   Social History  . Marital status: Married    Spouse name: N/A  . Number of children: 3  . Years of education: N/A   Occupational History  . 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 Smoking status: Former Smoker    Packs/day: 1.00    Years: 35.00    Types: Cigarettes    Quit date: 01/12/1996  . Smokeless tobacco: Never Used  . Alcohol use No  . Drug use: No  . Sexual activity: Yes   Other Topics Concern  . Not on file   Social History Narrative   Married 1 son 2 daughters   Disabled   2 caffeine/day   Past smoker   11/12/2015       Family History  Problem Relation Age of Onset  . Heart disease Mother   . Liver disease Mother        alcohol related  . Asthma Brother   . Esophageal cancer Brother   . Asthma Son   . Colon cancer Neg Hx   . Stomach cancer Neg Hx   . Pancreatic cancer Neg Hx   . Inflammatory bowel disease Neg Hx     ROS- All systems are reviewed and negative except as per the HPI above  Physical Exam: Vitals:   10/26/16 1349  BP: 118/62  Pulse: 67  Weight: 260 lb 9.6 oz (118.2 kg)  Height: _0  (1.575 m)   Wt Readings from Last 3 Encounters:  10/26/16 260 lb 9.6 oz (118.2 kg)  10/19/16 257 lb (116.6 kg)  10/14/16 253 lb (114.8 kg)    Labs: Lab Results  Component Value Date   NA 142 10/26/2016   K 3.7 10/26/2016   CL 102 10/26/2016   CO2 34 (H) 10/26/2016   GLUCOSE 152 (H) 10/26/2016   BUN 13 10/26/2016   CREATININE 0.74 10/26/2016   CALCIUM 8.5 (L) 10/26/2016   PHOS 3.4 10/09/2016   MG 1.9 10/09/2016   Lab Results  Component Value Date   INR 1.15 10/07/2016   Lab Results  Component Value Date  CHOL 148 04/11/2015   HDL 41.20 04/11/2015   LDLCALC 84 04/11/2015   TRIG 117.0 04/11/2015     GEN- The patient is well  appearing, alert and oriented x 3 today.   Head- normocephalic, atraumatic Eyes-  Sclera clear, conjunctiva pink Ears- hearing intact Oropharynx- clear Neck- supple, no JVP Lymph- no cervical lymphadenopathy Lungs- Clear to ausculation bilaterally, normal work of breathing Heart- Regular rate and rhythm, no murmurs, rubs or gallops, PMI not laterally displaced GI- soft, NT, ND, + BS Extremities- no clubbing, cyanosis, or edema MS- no significant deformity or atrophy Skin- no rash or lesion Psych- euthymic mood, full affect Neuro- strength and sensation are intact  EKG-NSR at 67 bpm, pr int 158 ms, qrs int 86 ms, qtc 412 ms Epic records reviewed Last TTE in 2013-Study Conclusions  - Left ventricle: The cavity size was normal. Wall thickness was increased in a pattern of moderate LVH. Systolic function was normal. The estimated ejection fraction was in the range of 60% to 65%. Wall motion was normal; there were no regional wall motion abnormalities. - Mitral valve: Calcified annulus. - Left atrium: The atrium was mildly dilated. - Pulmonary arteries: PA peak pressure: 85m Hg (S).    Assessment and Plan: 1. Atrial flutter S/p ablation x 2, last one 10/08/16  2. Paroxysmal afib S/p successful cardioversion 10/4 For now, will continue propafenone 325 mg bid Continue metoprolol tartrate at 25 mg bid Continue Eliquis 5 mg bid for chadsvasc score of at least 5 Update echo Sleep study scheduled  I will ask for pt to be seen by Dr. TLovena Lein the next 1-2 weeks for consideration if change in antiarrythmic is needed  DButch PennyC. [place], ACollier Hospital[address] Ford[address] StreetGDeRidder Dyer [phone]

## 2016-10-27 NOTE — Patient Instructions (Addendum)
See calendar for specific medication instructions and bring it back for each and every office visit for every healthcare provider you see.  Without it,  you may not receive the best quality medical care that we feel you deserve.  You will note that the calendar groups together  your maintenance  medications that are timed at particular times of the day.  Think of this as your checklist for what your doctor has instructed you to do until your next evaluation to see what benefit  there is  to staying on a consistent group of medications intended to keep you well.  The other group at the bottom is entirely up to you to use as you see fit  for specific symptoms that may arise between visits that require you to treat them on an as needed basis.  Think of this as your action plan or "what if" list.   Separating the top medications from the bottom group is fundamental to providing you adequate care going forward.    Work on pacing yourself with exertion on 4lpm per POC - walk at pace where you can keep going for up to 20-30 minutes at a time     See [name] NP 6 weeks with all your medications, even over the counter meds, separated in two separate bags, the ones you take no matter what vs the ones you stop once you feel better and take only as needed when you feel you need them.   [name]  will generate for you a new user friendly medication calendar that will put [place] all on the same page re: your medication use.   Late add Increase 02 to 5lpm walking/ consider adding lama = spiriva respimat next ov

## 2016-10-27 NOTE — Progress Notes (Signed)
Subjective:    Patient ID: [name] [name], female    DOB: 04-Apr-1950.   MRN: [id number]    Brief patient profile:  54 yowf last smoked 2003 with Morbid obesity/ COPD GOLD II  with minimum asthmatic component documented 07/25/06. On 02 24 hours per day at 3lpm       History of Present Illness  11/26/2014  f/u ov/[name] re: copd/ severe obesity /uacs  - has med calendar, not really using the action plans at the bottom Chief Complaint  Patient presents with  . Follow-up    Pt c/o increased cough for the past few months non prod.  She states "feels like I am downding in fluid".     On 2-2.5  24/7 able to do [employer] with 02 in a basket  sleep ok/ sensation of pnds day > noct   rec For drainage / throat tickle try take CHLORPHENIRAMINE  4 mg - take one every 4 hours as needed See calendar for specific medication instructions     12/24/15 Cough eval NP /[place] rec Please continue to follow the medication calender [name] developed with you 12/12/2015. Please see Dr. Jenny Reichmann about your worsening depression. Please use Mucinex twice daily as prescribed. Please use the chlorpheniramine up to every 4 hours as needed for cough. Don't drive if sleepy Delsym for cough during the day. Try sugar free [name] to soothe your throat  Gabapentin 100 mg three times daily with meals>  Did not start       05/04/2016  f/u ov/[name] re: GOLD II /  ohs  maint on symb 160 2bid  Chief Complaint  Patient presents with  . Follow-up    Pt states her breathing is good "as long as I stay inside". She is coughing less.    tremors started x one year prior to OV  Worse x 3 months but no different if misses dose of symbicort/ no need for saba at all  Cough better if sips water / worse with "allergy season" though has no obvious nasal allergy symptoms  Rec Prednisone 10 mg take  4 each am x 2 days,   2 each am x 2 days,  1 each am x 2 days and stop  Remember for tickle/ allergy/ drainage to take  chlorpheniramine up to every 4 hours during the day  When walking turn the 02 up to 4lpm on your portable concentrator  See [name] NP in 3 years  with all your medication    10/27/2016  f/u ov/[name] re:  GOLD II   02 dep on 2.5 home/ 4lpm POC Chief Complaint  Patient presents with  . Follow-up    Breathing is unchanged. She is using her albuterol inhaler 2 x per wk on average and she rarely uses neb.   doe = MMRC3 = can't walk 100 yards even at a slow [name] at a flat grade s stopping due to sob  Even on 4lpm  Trouble with eyes watering / rhinitis     Better on eyedrops, 1st gen H1 blockers per guidelines      No obvious day to day or daytime variability or assoc excess/ purulent sputum or mucus plugs or hemoptysis or cp or chest tightness, subjective wheeze or overt sinus or hb symptoms. No unusual exp hx or h/o childhood pna/ asthma or knowledge of premature birth.  Sleeping ok without nocturnal  or early am exacerbation  of respiratory  c/o's or need for noct saba. Also denies  any obvious fluctuation of symptoms with weather or environmental changes or other aggravating or alleviating factors except as outlined above   Current Medications, Allergies, Complete Past Medical History, Past Surgical History, Family History, and Social History were reviewed in [employer] record.  ROS  The following are not active complaints unless bolded sore throat, dysphagia, dental problems, itching, sneezing,  nasal congestion or excess/ purulent secretions, ear ache,   fever, chills, sweats, unintended wt loss, classically pleuritic or exertional cp,  orthopnea pnd or leg swelling, presyncope, palpitations, abdominal pain, anorexia, nausea, vomiting, diarrhea  or change in bowel or bladder habits, change in stools or urine, dysuria,hematuria,  rash, arthralgias, visual complaints, headache, numbness, weakness or ataxia or problems with walking or coordination,  change in mood/affect or  memory.                     Past Medical History:  CHRONIC RHINITIS (ICD-472.0)  EDEMA (ICD-782.3)  ACUTE AND CHRONIC RESPIRATORY FAILURE (ICD-518.84)  MORBID OBESITY (ICD-278.01)  - Target wt = 179 for BMI < 30 peak wt 282  - Referred back to nutrition December 18, 2007  DEEP VENOUS THROMBOPHLEBITIS, HX OF (ICD-V12.52)  COPD (ICD-496)..........................................................Marland KitchenWert  - PFTs 07/25/06 FEV1 55% ratio 53, DLC0 51%  - HFA 50% December 16, 2009 > 75% March 27, 2010  DEPRESSION (ICD-311)  OSTEOPOROSIS (ICD-733.00) last BMD 4/08 -2.4, intolerant of bisphosphonates  HYPERTENSION (ICD-401.9)  Atrial fibrillation - paroxysmal  Anticoagulation therapy  Pulmonary embolism, hx of  neg stress myoview 10/07  Anxiety  glucose intolerance - on steroids  Hyperlipidemia  Complex medical regimen  - Med calendar done 05/01/2008, 08/26/2010 , 01/17/12 , 06/26/2013 , 12/12/2015          Objective:   Physical Exam     GEN: A/Ox3; pleasant , NAD, obese wf nad     Vital signs reviewed    -    - Note on arrival 02 sats  96% 2lpm POC  Wt  274 09/01/10 >   09/29/2010  275 > 01/01/2011  280 > 04/29/2011  279 > 285 11/17/2011  > 288 02/18/2012 > 281 04/19/2012 > 03/13/2013  282 >  09/11/2013  282 >  11/26/2014  259 > 11/27/2015  273 > 02/02/2016   272 > 05/04/2016   261>  10/27/2016  260    HEENT:  [place]/AT,  EACs-clear, TMs-wnl, NOSE-clear drainage, THROAT-clear, no lesions, no postnasal drip or exudate noted.   NECK:  Supple w/ fair ROM; no JVD; normal carotid impulses w/o bruits; no thyromegaly or nodules palpated; no lymphadenopathy.    RESP  Diminshed BS in bases   -no wheezes/ rales/ or rhonchi. no accessory muscle use, no dullness to percussion  CARD:  RRR, no m/r/g   -  Min sym bilateral lower ext pitting edema, pulses intact, no cyanosis or clubbing.  GI:   Soft & nt; nml bowel sounds; no organomegaly or masses detected.   Musco: Warm bil, no deformities or joint swelling  noted.   Neuro: alert, no focal deficits noted, very min bilat resting tremor    Skin: Warm, no lesions or rashes        I personally reviewed images and agree with radiology impression as follows:  CXR:   10/14/16 Cardiomegaly and mild pulmonary edema.

## 2016-10-28 ENCOUNTER — Encounter: Payer: Self-pay | Admitting: Internal Medicine

## 2016-10-28 ENCOUNTER — Telehealth: Payer: Self-pay | Admitting: *Deleted

## 2016-10-28 ENCOUNTER — Telehealth: Payer: Self-pay | Admitting: Internal Medicine

## 2016-10-28 DIAGNOSIS — I4891 Unspecified atrial fibrillation: Secondary | ICD-10-CM

## 2016-10-28 DIAGNOSIS — J9611 Chronic respiratory failure with hypoxia: Secondary | ICD-10-CM

## 2016-10-28 DIAGNOSIS — J9612 Chronic respiratory failure with hypercapnia: Principal | ICD-10-CM

## 2016-10-28 NOTE — Telephone Encounter (Deleted)
-----   Message from Juluis Mire, RN sent at 10/27/2016  1:21 PM EDT ----- Regarding: sleep study Pt needs sleep study for afib.  Thanks! Marzetta Board

## 2016-10-28 NOTE — Telephone Encounter (Signed)
-----   Message from Tanda Rockers, MD sent at 10/28/2016  8:39 AM EDT ----- Advise to increase to 5lpm with exertion/ 5lpm ok with slow adls  But continue 4lpm at rest

## 2016-10-28 NOTE — Assessment & Plan Note (Signed)
-   PFTs 07/25/06 FEV1 55% ratio 53, DLC0 51%  -  PFT's 04/19/2012 FEV1 50% (ratio 51) with 19% improvement p B2, dlco 42 > 99% corrected  - med calendar 01/17/2012  -Tudorza trial  01/17/12 > permanently d/c 11/27/2015 due to uacs   - 10/27/2016  After extensive coaching HFA effectiveness =    75%   Despite poor hfa relatively well compensated on just laba/ics and could add lama next ov if needed but she already struggles severely keeping her meds straight so I did not change them today   I had an extended discussion with the patient reviewing all relevant studies completed to date and  lasting 15 to 20 minutes of a 25 minute visit    Each maintenance medication was reviewed in detail including most importantly the difference between maintenance and prns and under what circumstances the prns are to be triggered using an action plan format that is not reflected in the computer generated alphabetically organized AVS but trather by a customized med calendar that reflects the AVS meds with confirmed 100% correlation.   In addition, Please see AVS for unique instructions that I personally wrote and verbalized to the the pt in detail and then reviewed with pt  by my nurse highlighting any  changes in therapy recommended at today's visit to their plan of care.

## 2016-10-28 NOTE — Telephone Encounter (Signed)
Pt aware of recs per MW  I sent order to [facility] to get her a tank that will go up to 5lpm

## 2016-10-28 NOTE — Assessment & Plan Note (Addendum)
-   09/29/10  Walked 3lpm  2 laps @ 185 ft each stopped due to  Sob with sats 90%     - HC03 11/08/11 = 34     - 11-17-11--o2 sat on ra at rest 83%ra     - 02/18/2012  Sat 88% RA across the room > Walked 3lpm x 3 laps @ 185 ft each stopped due to  End of study no desat     - 04/19/2012  Walked 3lpm x 3 laps @ 185 ft each stopped due to  End of study, sats dropped to 88 at very end  - HC03  34    08/01/15  - 05/04/2016 Patient Saturations on Room Air at Rest = 86% Patient Saturations on 4 Liters of oxygen while Ambulating = 90% Please briefly explain why patient needs home oxygen: Pt oxygen drops while walking. - 10/27/2016   Walked 4lpm POC  x one lap @ 185 stopped due to  Sob with desat 87% slow pace    As of 10/27/2016 rec 2lpm at rest and increase  5lpm walking

## 2016-10-28 NOTE — Telephone Encounter (Signed)
Left message for Susan

## 2016-10-28 NOTE — Telephone Encounter (Signed)
-----   Message from Juluis Mire, RN sent at 10/27/2016  1:21 PM EDT ----- Regarding: sleep study Pt needs sleep study for afib.  Thanks! Marzetta Board

## 2016-10-28 NOTE — Assessment & Plan Note (Signed)
Body mass index is 47.55 kg/m.  -  trending no change  Lab Results  Component Value Date   TSH 0.993 09/15/2016     Contributing to gerd risk/ doe/reviewed the need and the process to achieve and maintain neg calorie balance > defer f/u primary care including intermittently monitoring thyroid status

## 2016-10-28 NOTE — Telephone Encounter (Signed)
[name] [name] with [employer] returned call, CB is [phone].

## 2016-10-28 NOTE — Telephone Encounter (Signed)
Informed patient of upcoming sleep study and patient understanding was verbalized. Patient understands her sleep study is scheduled for Thursday November 18 2016. Patient understands her sleep study will be done at [facility] sleep lab. Patient understands she will receive a sleep packet in a week or so. Patient understands to call if she does not receive the sleep packet in a timely manner. Patient agrees with treatment and thanked me for call.

## 2016-10-29 NOTE — Telephone Encounter (Signed)
ATC pt, no answer. Left message for pt to call back.  

## 2016-11-01 ENCOUNTER — Other Ambulatory Visit: Payer: Self-pay | Admitting: Internal Medicine

## 2016-11-01 ENCOUNTER — Other Ambulatory Visit: Payer: Self-pay | Admitting: Cardiology

## 2016-11-01 NOTE — Telephone Encounter (Signed)
lmtcb for [name] [name] with [place]

## 2016-11-02 NOTE — Telephone Encounter (Signed)
LMTCB for [name] [name]. Will close this message.

## 2016-11-03 ENCOUNTER — Ambulatory Visit (HOSPITAL_COMMUNITY)
Admission: RE | Admit: 2016-11-03 | Discharge: 2016-11-03 | Disposition: A | Discharge status: Home / Self Care | Payer: BLUE CROSS/BLUE SHIELD | Source: Ambulatory Visit | Attending: Nurse Practitioner | Admitting: Nurse Practitioner

## 2016-11-03 ENCOUNTER — Other Ambulatory Visit (INDEPENDENT_AMBULATORY_CARE_PROVIDER_SITE_OTHER): Payer: BLUE CROSS/BLUE SHIELD

## 2016-11-03 DIAGNOSIS — E785 Hyperlipidemia, unspecified: Secondary | ICD-10-CM | POA: Insufficient documentation

## 2016-11-03 DIAGNOSIS — Z86711 Personal history of pulmonary embolism: Secondary | ICD-10-CM | POA: Diagnosis not present

## 2016-11-03 DIAGNOSIS — J449 Chronic obstructive pulmonary disease, unspecified: Secondary | ICD-10-CM | POA: Insufficient documentation

## 2016-11-03 DIAGNOSIS — I071 Rheumatic tricuspid insufficiency: Secondary | ICD-10-CM | POA: Insufficient documentation

## 2016-11-03 DIAGNOSIS — I480 Paroxysmal atrial fibrillation: Secondary | ICD-10-CM | POA: Diagnosis present

## 2016-11-03 DIAGNOSIS — Z87891 Personal history of nicotine dependence: Secondary | ICD-10-CM | POA: Insufficient documentation

## 2016-11-03 DIAGNOSIS — R197 Diarrhea, unspecified: Secondary | ICD-10-CM | POA: Diagnosis not present

## 2016-11-03 DIAGNOSIS — I10 Essential (primary) hypertension: Secondary | ICD-10-CM | POA: Diagnosis not present

## 2016-11-03 DIAGNOSIS — E119 Type 2 diabetes mellitus without complications: Secondary | ICD-10-CM | POA: Diagnosis not present

## 2016-11-03 LAB — HEPATIC FUNCTION PANEL
ALT: 8 U/L (ref 0–35)
AST: 10 U/L (ref 0–37)
Albumin: 3.7 g/dL (ref 3.5–5.2)
Alkaline Phosphatase: 68 U/L (ref 39–117)
Bilirubin, Direct: 0.2 mg/dL (ref 0.0–0.3)
Total Bilirubin: 0.9 mg/dL (ref 0.2–1.2)
Total Protein: 6.1 g/dL (ref 6.0–8.3)

## 2016-11-03 LAB — CBC WITH DIFFERENTIAL/PLATELET
Basophils Absolute: 0 10*3/uL (ref 0.0–0.1)
Basophils Relative: 0.2 % (ref 0.0–3.0)
Eosinophils Absolute: 0.2 10*3/uL (ref 0.0–0.7)
Eosinophils Relative: 2.3 % (ref 0.0–5.0)
HCT: 31.2 % — ABNORMAL LOW (ref 36.0–46.0)
Hemoglobin: 10.6 g/dL — ABNORMAL LOW (ref 12.0–15.0)
Lymphocytes Relative: 27.3 % (ref 12.0–46.0)
Lymphs Abs: 2.3 10*3/uL (ref 0.7–4.0)
MCHC: 33.9 g/dL (ref 30.0–36.0)
MCV: 91.7 fl (ref 78.0–100.0)
Monocytes Absolute: 0.6 10*3/uL (ref 0.1–1.0)
Monocytes Relative: 7.3 % (ref 3.0–12.0)
Neutro Abs: 5.2 10*3/uL (ref 1.4–7.7)
Neutrophils Relative %: 62.9 % (ref 43.0–77.0)
Platelets: 210 10*3/uL (ref 150.0–400.0)
RBC: 3.4 Mil/uL — ABNORMAL LOW (ref 3.87–5.11)
RDW: 17.5 % — ABNORMAL HIGH (ref 11.5–15.5)
WBC: 8.3 10*3/uL (ref 4.0–10.5)

## 2016-11-03 LAB — BASIC METABOLIC PANEL
BUN: 13 mg/dL (ref 6–23)
CO2: 39 mEq/L — ABNORMAL HIGH (ref 19–32)
Calcium: 8.7 mg/dL (ref 8.4–10.5)
Chloride: 101 mEq/L (ref 96–112)
Creatinine, Ser: 0.75 mg/dL (ref 0.40–1.20)
GFR: 82.07 mL/min (ref >60.00)
Glucose, Bld: 98 mg/dL (ref 70–99)
Potassium: 3.7 mEq/L (ref 3.5–5.1)
Sodium: 145 mEq/L (ref 135–145)

## 2016-11-03 LAB — MAGNESIUM: Magnesium: 2 mg/dL (ref 1.5–2.5)

## 2016-11-03 NOTE — Progress Notes (Signed)
  Echocardiogram 2D Echocardiogram has been performed.  Jennette Dubin 11/03/2016, 3:54 PM

## 2016-11-05 ENCOUNTER — Other Ambulatory Visit: Payer: Self-pay | Admitting: Cardiology

## 2016-11-08 ENCOUNTER — Telehealth: Payer: Self-pay

## 2016-11-08 ENCOUNTER — Other Ambulatory Visit (INDEPENDENT_AMBULATORY_CARE_PROVIDER_SITE_OTHER): Payer: BLUE CROSS/BLUE SHIELD

## 2016-11-08 DIAGNOSIS — R7302 Impaired glucose tolerance (oral): Secondary | ICD-10-CM | POA: Diagnosis not present

## 2016-11-08 DIAGNOSIS — Z0000 Encounter for general adult medical examination without abnormal findings: Secondary | ICD-10-CM | POA: Diagnosis not present

## 2016-11-08 DIAGNOSIS — R197 Diarrhea, unspecified: Secondary | ICD-10-CM

## 2016-11-08 LAB — CBC WITH DIFFERENTIAL/PLATELET
Basophils Absolute: 0 10*3/uL (ref 0.0–0.1)
Basophils Relative: 0.4 % (ref 0.0–3.0)
Eosinophils Absolute: 0.2 10*3/uL (ref 0.0–0.7)
Eosinophils Relative: 2.3 % (ref 0.0–5.0)
HCT: 31.2 % — ABNORMAL LOW (ref 36.0–46.0)
Hemoglobin: 10.7 g/dL — ABNORMAL LOW (ref 12.0–15.0)
Lymphocytes Relative: 24 % (ref 12.0–46.0)
Lymphs Abs: 1.6 10*3/uL (ref 0.7–4.0)
MCHC: 34.4 g/dL (ref 30.0–36.0)
MCV: 90.7 fl (ref 78.0–100.0)
Monocytes Absolute: 0.5 10*3/uL (ref 0.1–1.0)
Monocytes Relative: 7.8 % (ref 3.0–12.0)
Neutro Abs: 4.4 10*3/uL (ref 1.4–7.7)
Neutrophils Relative %: 65.5 % (ref 43.0–77.0)
Platelets: 216 10*3/uL (ref 150.0–400.0)
RBC: 3.44 Mil/uL — ABNORMAL LOW (ref 3.87–5.11)
RDW: 17.3 % — ABNORMAL HIGH (ref 11.5–15.5)
WBC: 6.7 10*3/uL (ref 4.0–10.5)

## 2016-11-08 LAB — URINALYSIS, ROUTINE W REFLEX MICROSCOPIC
Bilirubin Urine: NEGATIVE
Hgb urine dipstick: NEGATIVE
Ketones, ur: NEGATIVE
Nitrite: NEGATIVE
RBC / HPF: NONE SEEN (ref 0–?)
Specific Gravity, Urine: 1.015 (ref 1.000–1.030)
Total Protein, Urine: NEGATIVE
Urine Glucose: NEGATIVE
Urobilinogen, UA: 0.2 (ref 0.0–1.0)
pH: 7 (ref 5.0–8.0)

## 2016-11-08 LAB — BASIC METABOLIC PANEL
BUN: 9 mg/dL (ref 6–23)
CO2: 39 mEq/L — ABNORMAL HIGH (ref 19–32)
Calcium: 8.8 mg/dL (ref 8.4–10.5)
Chloride: 100 mEq/L (ref 96–112)
Creatinine, Ser: 0.56 mg/dL (ref 0.40–1.20)
GFR: 114.96 mL/min (ref >60.00)
Glucose, Bld: 160 mg/dL — ABNORMAL HIGH (ref 70–99)
Potassium: 3.6 mEq/L (ref 3.5–5.1)
Sodium: 146 mEq/L — ABNORMAL HIGH (ref 135–145)

## 2016-11-08 LAB — LIPID PANEL
Cholesterol: 125 mg/dL (ref 0–200)
HDL: 35.8 mg/dL — ABNORMAL LOW (ref >39.00)
LDL Cholesterol: 68 mg/dL (ref 0–99)
NonHDL: 89.19
Total CHOL/HDL Ratio: 3
Triglycerides: 105 mg/dL (ref 0.0–149.0)
VLDL: 21 mg/dL (ref 0.0–40.0)

## 2016-11-08 LAB — HEPATIC FUNCTION PANEL
ALT: 7 U/L (ref 0–35)
AST: 9 U/L (ref 0–37)
Albumin: 3.8 g/dL (ref 3.5–5.2)
Alkaline Phosphatase: 77 U/L (ref 39–117)
Bilirubin, Direct: 0.1 mg/dL (ref 0.0–0.3)
Total Bilirubin: 0.8 mg/dL (ref 0.2–1.2)
Total Protein: 6.4 g/dL (ref 6.0–8.3)

## 2016-11-08 LAB — HEMOGLOBIN A1C: Hgb A1c MFr Bld: 4.7 % (ref 4.6–6.5)

## 2016-11-08 LAB — TSH: TSH: 4.59 u[IU]/mL — ABNORMAL HIGH (ref 0.35–4.50)

## 2016-11-08 NOTE — Telephone Encounter (Signed)
If there is no fever or pain of any kind, such as dysuria, abd pain or flank pain, I think we can hold off on the antibx for now  Please let us know if any of this starts

## 2016-11-08 NOTE — Telephone Encounter (Signed)
-----   Message from Biagio Borg, MD sent at 11/08/2016 12:42 PM EDT ----- Left message on MyChart, pt to cont same tx except  The test results show that your current treatment is OK, except the urine testing shows an elevation of the white blood cells.  This may or may not be consistent with infection (UTI).  I will ask Shirron from the office to call you regarding any fever or urinary symptoms that might warrant an antibiotic.  Marland Kitchen    Redmond Baseman to please inform pt, and ask if any fever, pain or other urinary symptoms

## 2016-11-08 NOTE — Telephone Encounter (Signed)
Pt has been informed of results and expressed understanding. She had no urinary issues that she is aware of, she mentioned frequency but stated that she is also on a fluid pill. She asked if the antibiotic could be sent to [facility].

## 2016-11-09 NOTE — Telephone Encounter (Signed)
Patient  Advised and she will call back if becomes symptomatic

## 2016-11-10 ENCOUNTER — Ambulatory Visit: Payer: BLUE CROSS/BLUE SHIELD | Admitting: Internal Medicine

## 2016-11-11 ENCOUNTER — Ambulatory Visit: Payer: BLUE CROSS/BLUE SHIELD | Admitting: Physician Assistant

## 2016-11-11 ENCOUNTER — Telehealth: Payer: Self-pay | Admitting: *Deleted

## 2016-11-11 ENCOUNTER — Other Ambulatory Visit: Payer: Self-pay | Admitting: *Deleted

## 2016-11-11 MED ORDER — DILTIAZEM HCL ER COATED BEADS 360 MG PO CP24: 360.0000 mg | ORAL_CAPSULE | Freq: Every day | ORAL | 3 refills | Status: DC

## 2016-11-11 MED ORDER — PROPAFENONE HCL ER 325 MG PO CP12: 325.0000 mg | ORAL_CAPSULE | Freq: Two times a day (BID) | ORAL | 3 refills | Status: DC

## 2016-11-11 NOTE — Telephone Encounter (Signed)
LMVOM TO  SEE ABOUT CURRENT SYMPTOMS PER URSUY. TO DETERMINE IF OFFICE VISIT FOR TODAY IS NEEDED.

## 2016-11-11 NOTE — Progress Notes (Deleted)
Cardiology Office Note Date:  23/03/74  Patient ID:  [name], [name] [birth year], MRN [id number] PCP:  Biagio Borg, MD  Cardiologist:  Dr. Stanford Breed Electrophysiologist: Dr. Lovena Le  ***refresh   Chief Complaint: follow up  History of Present Illness: [name] [name] is a 66 y.o. female with history of PAFib and flutter (flutter ablation in 2010), COPD w/chronic O2, HTN, hypothyroidism, HLD, ASD w/PFO by TEE 2016, anxiety/depression, hx opf remote PE, DVT.    She was hospitalized with recurrent AFlutter and COPD exacerbation early Sept, underwent DCCV, readmitted 10/07/16 with rapid AFlutter underwent AFlutter ablation during that stay maintained on a/c, Rythmol and BB, Unfortunately again in ER 10/14/16 with rapid Afib and underwent DCCV.  She had follow up in the AF clinic 10/26/16 was in [place], referred back to Dr. Lovena Le to discuss alternative AAD therapy.  *** symptoms *** bleeding/Eliquis *** meds *** AFib ablation? *** echo OK, mod AS, p.HTN  AF history: AFlutter ablation 8/302010, 10/08/16, Dr. Lovena Le AFib dx dates back to prior to the AFlutter (unknown initial diagnosis timing) 2016, 09/2016 x2, recurrent AFlutter DCCV : 12/11/14, 09/15/16, 10/14/16 AAD propafenone going back to at least 2010   Past Medical History:  Diagnosis Date  . Anemia    hx  . Anxiety   . Arthritis   . Asthma   . Atrial fibrillation and flutter ([place])   . Atypical lobular hyperplasia of right breast 05/09/2015  . Breast CA ([place])    ?  Marland Kitchen Chronic rhinitis   . COPD (chronic obstructive pulmonary disease) ([place])    Wert. PFTs 07/25/06 FEV1 55% ratio 53, DLC0 51% HFA 50% 12/16/2009  . Depression   . FRACTURE, RIB, RIGHT 06/18/2009  . Glucose intolerance (impaired glucose tolerance)    on steroids  . History of DVT (deep vein thrombosis)   . Hx of cardiac catheterization    LHC (01/2001):  Normal Cors.  EF 65%.;  LexiScan Myoview (03/2013): No ischemia, EF 61%, normal  . Hx of echocardiogram    Echo  (11/2011):  Mod LVH, EF 60-65%, no RWMA, MAC, mild LAE, PASP 34.  Marland Kitchen HYPERLIPIDEMIA   . Hypertension   . Hypothyroidism   . Morbid obesity ([place])    target wt=179lb for BMI<30 Peak wt 282lb  . Osteoporosis    last BMD 4/08 -2.4, intolerant of bisphosphonates  . Peripheral vascular disease ([facility])    hx dvt.pe  . Pneumonia    hx  . PONV (postoperative nausea and vomiting)   . PREMATURE VENTRICULAR CONTRACTIONS   . PULMONARY EMBOLISM, HX OF 06/19/2007  . Rhabdomyolysis 07/29/2009  . Tremor   . VITAMIN D DEFICIENCY     Past Surgical History:  Procedure Laterality Date  . A-FLUTTER ABLATION N/A 10/08/2016   Procedure: A-FLUTTER ABLATION;  Surgeon: Evans Lance, MD;  Location: [facility];  Service: Cardiovascular;  Laterality: N/A;  . ABDOMINAL HYSTERECTOMY    . BREAST LUMPECTOMY WITH RADIOACTIVE SEED LOCALIZATION Right 06/05/2015   Procedure: RIGHT BREAST LUMPECTOMY WITH RADIOACTIVE SEED LOCALIZATION;  Surgeon: Fanny Skates, MD;  Location: [facility];  Service: General;  Laterality: Right;  . BREAST SURGERY Left    s/p mass removal  . CARDIOVERSION N/A 12/11/2014   Procedure: CARDIOVERSION;  Surgeon: Josue Hector, MD;  Location: [facility];  Service: Cardiovascular;  Laterality: N/A;  . CARDIOVERSION N/A 09/15/2016   Procedure: CARDIOVERSION;  Surgeon: Jerline Pain, MD;  Location: [facility];  Service: Cardiovascular;  Laterality: N/A;  .  COLONOSCOPY  10/22/2003, ?2006  . COLONOSCOPY WITH PROPOFOL N/A 01/15/2016   Procedure: COLONOSCOPY WITH PROPOFOL;  Surgeon: Gatha Mayer, MD;  Location: [facility];  Service: Endoscopy;  Laterality: N/A;  . ESOPHAGOGASTRODUODENOSCOPY (EGD) WITH PROPOFOL N/A 01/15/2016   Procedure: ESOPHAGOGASTRODUODENOSCOPY (EGD) WITH PROPOFOL;  Surgeon: Gatha Mayer, MD;  Location: [facility];  Service: Endoscopy;  Laterality: N/A;  . s/p left arm fracture with fall off chair    . skin graft to middle R finger  1975  . TEE WITHOUT CARDIOVERSION N/A  12/11/2014   Procedure: TRANSESOPHAGEAL ECHOCARDIOGRAM (TEE);  Surgeon: Josue Hector, MD;  Location: [facility];  Service: Cardiovascular;  Laterality: N/A;  . TONSILLECTOMY      Current Outpatient Prescriptions  Medication Sig Dispense Refill  . acetaminophen (TYLENOL) 650 MG CR tablet Take 650-1,300 mg by mouth as needed for pain. Per bottle as needed    . albuterol (PROAIR HFA) 108 (90 Base) MCG/ACT inhaler Inhale 2 puffs into the lungs every 6 (six) hours as needed for wheezing or shortness of breath. 3 Inhaler 3  . apixaban (ELIQUIS) 5 MG TABS tablet Take 1 tablet (5 mg total) by mouth 2 (two) times daily. 180 tablet 1  . atorvastatin (LIPITOR) 10 MG tablet Take 1 tablet (10 mg total) by mouth daily. 90 tablet 2  . budesonide-formoterol (SYMBICORT) 160-4.5 MCG/ACT inhaler Inhale 2 puffs into the lungs 2 (two) times daily. 3 Inhaler 2  . Calcium Carbonate-Vitamin D 600-400 MG-UNIT per tablet Take 1 tablet by mouth 2 (two) times daily.      Marland Kitchen Dextromethorphan-Guaifenesin (MUCINEX DM MAXIMUM STRENGTH) 60-1200 MG TB12 Take 1 tablet by mouth every 12 (twelve) hours as needed (cough/congestion). With flutter    . ferrous sulfate 325 (65 FE) MG tablet Take 325 mg by mouth 2 (two) times daily with a meal.     . FLUoxetine (PROZAC) 40 MG capsule TAKE 1 CAPSULE DAILY (Patient taking differently: TAKE 40 mg CAPSULE DAILY) 90 capsule 3  . fluticasone (FLONASE) 50 MCG/ACT nasal spray Place 2 sprays into both nostrils 2 (two) times daily as needed for allergies or rhinitis.    . furosemide (LASIX) 80 MG tablet Take 1 tablet (80 mg total) by mouth 2 (two) times daily.    Marland Kitchen glucosamine-chondroitin 500-400 MG tablet Take 1 tablet by mouth every morning.     Marland Kitchen KLOR-CON M20 20 MEQ tablet TAKE 1 TABLET TWICE A DAY. PLEASE SCHEDULE APPOINTMENT FOR REFILLS 180 tablet 0  . levalbuterol (XOPENEX) 1.25 MG/3ML nebulizer solution Take 1.25 mg by nebulization every 4 (four) hours. 360 mL 11  . levothyroxine  (SYNTHROID, LEVOTHROID) 25 MCG tablet TAKE 1 TABLET DAILY BEFORE BREAKFAST 90 tablet 0  . metoprolol tartrate (LOPRESSOR) 25 MG tablet Take 1 tablet (25 mg total) by mouth 2 (two) times daily. 60 tablet 0  . Olopatadine HCl (PATADAY) 0.2 % SOLN Place 1 drop into both eyes daily.    Marland Kitchen omeprazole (PRILOSEC) 20 MG capsule Take 57ms before supper daily    . OXYGEN Inhale 2.5 L/min into the lungs continuous.    . propafenone (RYTHMOL SR) 325 MG 12 hr capsule Take 1 capsule (325 mg total) by mouth 2 (two) times daily. 180 capsule 3  . senna-docusate (SENOKOT-S) 8.6-50 MG tablet Take 1 tablet by mouth at bedtime as needed for mild constipation. 30 tablet 0  . traMADol (ULTRAM) 50 MG tablet Take 1-2 tablets (50-100 mg total) by mouth every 4 (four) hours as needed. 40 tablet  0   No current facility-administered medications for this visit.     Allergies:   Duloxetine; Penicillins; and Latex   Social History:  The patient  reports that she quit smoking about 20 years ago. Her smoking use included Cigarettes. She has a 35.00 pack-year smoking history. She has never used smokeless tobacco. She reports that she does not drink alcohol or use drugs.   Family History:  The patient's family history includes Asthma in her brother and son; Esophageal cancer in her brother; Heart disease in her mother; Liver disease in her mother.  ROS:  Please see the history of present illness.    All other systems are reviewed and otherwise negative.   PHYSICAL EXAM: *** VS:  There were no vitals taken for this visit. BMI: There is no height or weight on file to calculate BMI. Well nourished, well developed, in no acute distress  HEENT: normocephalic, atraumatic  Neck: no JVD, carotid bruits or masses Cardiac:  *** RRR; no significant murmurs, no rubs, or gallops Lungs:  *** CTA b/l, no wheezing, rhonchi or rales  Abd: soft, nontender MS: no deformity or *** atrophy Ext: *** no edema  Skin: warm and dry, no  rash Neuro:  No gross deficits appreciated Psych: euthymic mood, full affect    EKG:  Done today shows ***  11/03/16: TTE Study Conclusions - Left ventricle: The cavity size was normal. Wall thickness was   normal. Systolic function was normal. The estimated ejection   fraction was in the range of 55% to 60%. Wall motion was normal;   there were no regional wall motion abnormalities. Features are   consistent with a pseudonormal left ventricular filling pattern,   with concomitant abnormal relaxation and increased filling   pressure (grade 2 diastolic dysfunction). - Aortic valve: Mildly calcified annulus. There was moderate   stenosis. Valve area (VTI): 1.59 cm^2. Valve area (Vmax): 1.51   cm^2. Valve area (Vmean): 1.56 cm^2. - Mitral valve: Valve area by pressure half-time: 2.14 cm^2. Valve   area by continuity equation (using LVOT flow): 2.02 cm^2. - Left atrium: The atrium was severely dilated. - Pulmonary arteries: Systolic pressure was moderately increased.   PA peak pressure: 47 mm Hg (S).  10/08/16: EPS/Ablation, Dr. Lovena Le CONCLUSIONS:  1. Isthmus-dependent right atrial flutter upon presentation.  2. Successful radiofrequency ablation of atrial flutter along the cavotricuspid isthmus with complete bidirectional isthmus block achieved.  3. No inducible arrhythmias following ablation.  4. No early apparent complications.   Recent Labs: 09/14/2016: B Natriuretic Peptide 98.4 11/03/2016: Magnesium 2.0 11/08/2016: ALT 7; BUN 9; Creatinine, Ser 0.56; Hemoglobin 10.7; Platelets 216.0; Potassium 3.6; Sodium 146; TSH 4.59  11/08/2016: Cholesterol 125; HDL 35.80; LDL Cholesterol 68; Total CHOL/HDL Ratio 3; Triglycerides 105.0; VLDL 21.0   CrCl cannot be calculated (Unknown ideal weight.).   Wt Readings from Last 3 Encounters:  10/27/16 260 lb (117.9 kg)  10/26/16 260 lb 9.6 oz (118.2 kg)  10/19/16 257 lb (116.6 kg)     Other studies reviewed: Additional studies/records  reviewed today include: summarized above  ASSESSMENT AND PLAN:  1. PAFib (Hx of AFlutter ablation x2)     CHA2DS2Vasc is at least 3, + hx of DVT/PE on Eliquis     ***  2. HTN     ***  3. VHD w/mod AS     ***    Disposition: F/u with ***  Current medicines are reviewed at length with the patient today.  The patient did not  have any concerns regarding medicines.***  Signed, Tommye Standard, PA-C 11/11/2016 6:12 AM     [facility] [address] [place] [address] [phone] (office)  [phone] (fax)

## 2016-11-11 NOTE — Telephone Encounter (Signed)
LMOVM

## 2016-11-12 LAB — STOOL CULTURE
MICRO NUMBER:: 81209039
MICRO NUMBER:: 81209040
MICRO NUMBER:: 81209041
SHIGA RESULT:: NOT DETECTED
SPECIMEN QUALITY:: ADEQUATE
SPECIMEN QUALITY:: ADEQUATE
SPECIMEN QUALITY:: ADEQUATE

## 2016-11-18 ENCOUNTER — Ambulatory Visit (HOSPITAL_BASED_OUTPATIENT_CLINIC_OR_DEPARTMENT_OTHER): Payer: BLUE CROSS/BLUE SHIELD | Attending: Cardiology | Admitting: Cardiology

## 2016-11-18 VITALS — Ht 62.0 in | Wt 260.0 lb

## 2016-11-18 DIAGNOSIS — I493 Ventricular premature depolarization: Secondary | ICD-10-CM | POA: Diagnosis not present

## 2016-11-18 DIAGNOSIS — I4891 Unspecified atrial fibrillation: Secondary | ICD-10-CM

## 2016-11-18 DIAGNOSIS — G4733 Obstructive sleep apnea (adult) (pediatric): Secondary | ICD-10-CM | POA: Insufficient documentation

## 2016-11-24 ENCOUNTER — Telehealth: Payer: Self-pay | Admitting: *Deleted

## 2016-11-24 DIAGNOSIS — G4733 Obstructive sleep apnea (adult) (pediatric): Secondary | ICD-10-CM

## 2016-11-24 NOTE — Procedures (Signed)
   Patient Name: [name] [name], [name] [name] Date: 45/80/9983 Gender: Female D.O.B: May 20, 1950 Age (years): 33 Referring Provider: Fransico Him MD, ABSM Height (inches): 62 Interpreting Physician: Fransico Him MD, ABSM Weight (lbs): 260 RPSGT: Laren Everts BMI: 48 MRN: [id number] Neck Size: 16.5  CLINICAL INFORMATION Sleep Study Type: NPSG  Indication for sleep study: Congestive Heart Failure, COPD, Fatigue, Hypertension, Obesity  Epworth Sleepiness Score: 10  SLEEP STUDY TECHNIQUE As per the AASM Manual for the Scoring of Sleep and Associated Events v2.3 (April 2016) with a hypopnea requiring 4% desaturations.  The channels recorded and monitored were frontal, central and occipital EEG, electrooculogram (EOG), submentalis EMG (chin), nasal and oral airflow, thoracic and abdominal wall motion, anterior tibialis EMG, snore microphone, electrocardiogram, and pulse oximetry.  MEDICATIONS Medications self-administered by patient taken the night of the study : N/A  SLEEP ARCHITECTURE The study was initiated at 10:08:44 PM and ended at 4:49:22 AM.  Sleep onset time was 39.0 minutes and the sleep efficiency was 78.8%. The total sleep time was 315.6 minutes.  Stage REM latency was 297.0 minutes.  The patient spent 7.32% of the night in stage N1 sleep, 71.77% in stage N2 sleep, 5.07% in stage N3 and 15.84% in REM.  Alpha intrusion was absent.  Supine sleep was 27.88%.  RESPIRATORY PARAMETERS The overall apnea/hypopnea index (AHI) was 8.4 per hour. There were 44 total apneas, including 43 obstructive, 1 central and 0 mixed apneas. There were 0 hypopneas and 64 RERAs.  The AHI during Stage REM sleep was 45.6 per hour.  AHI while supine was 1.4 per hour.  The mean oxygen saturation was 97.85%. The minimum SpO2 during sleep was 94.00%.  moderate snoring was noted during this study.  CARDIAC DATA The 2 lead EKG demonstrated atrial fibrillation. The mean heart rate was 77.84 beats  per minute. Other EKG findings include: PVCs.  LEG MOVEMENT DATA The total PLMS were 151 with a resulting PLMS index of 28.71. Associated arousal with leg movement index was 3.0 .  IMPRESSIONS Mild obstructive sleep apnea occurred during this study (AHI = 8.4/h). No significant central sleep apnea occurred during this study (CAI = 0.2/h). Mild oxygen desaturation was noted during this study (Min O2 = 94.00%). The patient snored with moderate snoring volume. EKG findings include PVCs. Moderate periodic limb movements of sleep occurred during the study. No significant associated arousals.  DIAGNOSIS Obstructive Sleep Apnea (327.23 [G47.33 ICD-10])  RECOMMENDATIONS Therapeutic CPAP titration to determine optimal pressure required to alleviate sleep disordered breathing. Avoid alcohol, sedatives and other CNS depressants that may worsen sleep apnea and disrupt normal sleep architecture. Sleep hygiene should be reviewed to assess factors that may improve sleep quality. Weight management and regular exercise should be initiated or continued if appropriate.  [place], American Board of Sleep Medicine  ELECTRONICALLY SIGNED ON:  11/24/2016, 1:07 PM [place] PH: [phone]   FX: [phone] [place]

## 2016-11-24 NOTE — Telephone Encounter (Signed)
Informed patient of sleep study results and patient understanding was verbalized. Patient understands she has sleep apnea and that Dr Radford Pax recommends a CPAP Titration to treat her sleep apnea. Patient wants to speak to DrTurner before making a decision to wear a CPAP for the rest of her life.  Best contact number is [phone].

## 2016-11-24 NOTE — Telephone Encounter (Signed)
-----   Message from Sueanne Margarita, MD sent at 11/24/2016  1:10 PM EST ----- Please let patient know that they have sleep apnea and recommend CPAP titration. Please set up titration in the sleep lab.

## 2016-11-24 NOTE — Progress Notes (Deleted)
   Patient Name: [name] [name], [name] [name] Date: 97/02/6376 Gender: Female D.O.B: Jun 01, 1950 Age (years): 89 Referring Provider: Fransico Him MD, ABSM Height (inches): 62 Interpreting Physician: Fransico Him MD, ABSM Weight (lbs): 260 RPSGT: Laren Everts BMI: 48 MRN: [id number] Neck Size: 16.5  CLINICAL INFORMATION Sleep Study Type: NPSG  Indication for sleep study: Congestive Heart Failure, COPD, Fatigue, Hypertension, Obesity  Epworth Sleepiness Score: 10  SLEEP STUDY TECHNIQUE As per the AASM Manual for the Scoring of Sleep and Associated Events v2.3 (April 2016) with a hypopnea requiring 4% desaturations.  The channels recorded and monitored were frontal, central and occipital EEG, electrooculogram (EOG), submentalis EMG (chin), nasal and oral airflow, thoracic and abdominal wall motion, anterior tibialis EMG, snore microphone, electrocardiogram, and pulse oximetry.  MEDICATIONS Medications self-administered by patient taken the night of the study : N/A  SLEEP ARCHITECTURE The study was initiated at 10:08:44 PM and ended at 4:49:22 AM.  Sleep onset time was 39.0 minutes and the sleep efficiency was 78.8%. The total sleep time was 315.6 minutes.  Stage REM latency was 297.0 minutes.  The patient spent 7.32% of the night in stage N1 sleep, 71.77% in stage N2 sleep, 5.07% in stage N3 and 15.84% in REM.  Alpha intrusion was absent.  Supine sleep was 27.88%.  RESPIRATORY PARAMETERS The overall apnea/hypopnea index (AHI) was 8.4 per hour. There were 44 total apneas, including 43 obstructive, 1 central and 0 mixed apneas. There were 0 hypopneas and 64 RERAs.  The AHI during Stage REM sleep was 45.6 per hour.  AHI while supine was 1.4 per hour.  The mean oxygen saturation was 97.85%. The minimum SpO2 during sleep was 94.00%.  moderate snoring was noted during this study.  CARDIAC DATA The 2 lead EKG demonstrated atrial fibrillation. The mean heart rate was 77.84  beats per minute. Other EKG findings include: PVCs.  LEG MOVEMENT DATA The total PLMS were 151 with a resulting PLMS index of 28.71. Associated arousal with leg movement index was 3.0 .  IMPRESSIONS Mild obstructive sleep apnea occurred during this study (AHI = 8.4/h). No significant central sleep apnea occurred during this study (CAI = 0.2/h). Mild oxygen desaturation was noted during this study (Min O2 = 94.00%). The patient snored with moderate snoring volume. EKG findings include PVCs. Moderate periodic limb movements of sleep occurred during the study. No significant associated arousals.  DIAGNOSIS Obstructive Sleep Apnea (327.23 [G47.33 ICD-10])  RECOMMENDATIONS Therapeutic CPAP titration to determine optimal pressure required to alleviate sleep disordered breathing. Avoid alcohol, sedatives and other CNS depressants that may worsen sleep apnea and disrupt normal sleep architecture. Sleep hygiene should be reviewed to assess factors that may improve sleep quality. Weight management and regular exercise should be initiated or continued if appropriate.  [place], American Board of Sleep Medicine  ELECTRONICALLY SIGNED ON:  11/24/2016, 1:07 PM [place] PH: [phone]   FX: [phone] [place]

## 2016-11-25 ENCOUNTER — Telehealth: Payer: Self-pay | Admitting: Internal Medicine

## 2016-11-25 NOTE — Telephone Encounter (Signed)
Called spoke with patient, discussed MW's recommendations  Pt okay with this and voiced her understanding - does not live in [place] so requests an appt for tomorrow MW has a 0945 and TP has a 1045 Pt preferred the 1045 w/ TP >> scheduled Pt aware to bring all meds to the appt Because this is a new problem and pt is actively struggling with this cough, appt scheduled as an ACUTE visit rather than a MED CAL. Will leave the 11.30.18 appt for now and let TP determine if that follow up is needed  Nothing further needed at this time; will sign off

## 2016-11-25 NOTE — Telephone Encounter (Signed)
   Called spoke with patient who c/o dry hacking cough - reports this cough "comes and goes" and is "with the weather change."  Some PND, sore throat and hoarseness from coughing.  Denies any chest congestion, wheezing, tightness, increased SOB, head congestion, sinus pressure.  No ProAir or Xopenex neb use.    Patient stated she is using her eye drops, nose spray and is on her second bottle of Delsym without any relief.  She is scheduled to see TP on 11.30.18 for med calendar but does not want to wait that long for recommendations.  Dr Melvyn Novas please advise, thank you.

## 2016-11-25 NOTE — Telephone Encounter (Signed)
We were not working on a cough at her last ov so this should be considered a new problem but if she's already following all the instuctions on the action part of her med calendar then needs ov asap with NP or Me with all meds in hand

## 2016-11-26 ENCOUNTER — Ambulatory Visit (INDEPENDENT_AMBULATORY_CARE_PROVIDER_SITE_OTHER): Payer: BLUE CROSS/BLUE SHIELD | Admitting: Adult Health

## 2016-11-26 ENCOUNTER — Encounter: Payer: Self-pay | Admitting: Adult Health

## 2016-11-26 DIAGNOSIS — J449 Chronic obstructive pulmonary disease, unspecified: Secondary | ICD-10-CM | POA: Diagnosis not present

## 2016-11-26 DIAGNOSIS — J9611 Chronic respiratory failure with hypoxia: Secondary | ICD-10-CM

## 2016-11-26 DIAGNOSIS — J310 Chronic rhinitis: Secondary | ICD-10-CM | POA: Diagnosis not present

## 2016-11-26 DIAGNOSIS — J9612 Chronic respiratory failure with hypercapnia: Secondary | ICD-10-CM | POA: Diagnosis not present

## 2016-11-26 MED ORDER — BUDESONIDE-FORMOTEROL FUMARATE 160-4.5 MCG/ACT IN AERO: 2.0000 | INHALATION_SPRAY | Freq: Two times a day (BID) | RESPIRATORY_TRACT | 1 refills | Status: DC

## 2016-11-26 MED ORDER — TIOTROPIUM BROMIDE MONOHYDRATE 1.25 MCG/ACT IN AERS: 2.0000 | INHALATION_SPRAY | Freq: Every day | RESPIRATORY_TRACT | 0 refills | Status: DC

## 2016-11-26 MED ORDER — PREDNISONE 10 MG PO TABS: ORAL_TABLET | ORAL | 0 refills | Status: DC

## 2016-11-26 MED ORDER — ALBUTEROL SULFATE HFA 108 (90 BASE) MCG/ACT IN AERS: 2.0000 | INHALATION_SPRAY | Freq: Four times a day (QID) | RESPIRATORY_TRACT | 1 refills | Status: DC | PRN

## 2016-11-26 NOTE — Assessment & Plan Note (Signed)
Cont on o2 .  

## 2016-11-26 NOTE — Addendum Note (Signed)
Addended by: Parke Poisson E on: 11/26/2016 12:41 PM   Modules accepted: Orders

## 2016-11-26 NOTE — Assessment & Plan Note (Addendum)
Mild flare with URI/AR  Hold on abx for now  Trial of LAMA   Patient's medications were reviewed today and patient education was given. Computerized medication calendar was adjusted/completed    Plan  Patient Instructions  Prednisone taper over the next week Begin Spiriva 2 puffs daily Follow  med calendar closely and bring to each visit.  Follow up Dr. Melvyn Novas  In 2 months and As needed   Please contact office for sooner follow up if symptoms do not improve or worsen or seek emergency care.

## 2016-11-26 NOTE — Assessment & Plan Note (Signed)
Flare   Plan  Use zytec , astelin As needed

## 2016-11-26 NOTE — Progress Notes (Signed)
<[id number]  ID: [name] [name], female    DOB: 1950/07/02, 66 y.o.   MRN: [id number]  Chief Complaint  Patient presents with  . Acute Visit    COPD     Referring provider: Biagio Borg, MD  HPI: 66 yo female former smoker followed for COPD GOLD III with minimal asthmatic component and O2 RF   TEST  PFTs 07/25/06 FEV1 55% ratio 53, DLC0 51%  - PFT's 04/19/2012 FEV1 50% (ratio 51) with 19% improvement p B2, dlco 42 >99% corrected    11/26/2016 Acute OV : COPD  Patient presents for an acute office visit.  She complains of 1 week of sore throat , ear ache , drainage and increased cough . Cough is minimally productive . No increased wheezing or dyspnea. Denies fever, hemotpysis edema or orthopnea. Taking Mucinex and Delsym and Astelin .  Without much help.   She remains on Symbicort twice daily. Previous on [place] in past, did not tolerate powder .   Remains on O2 2.5l/m rest and 4l/m walking .     Allergies  Allergen Reactions  . Duloxetine Other (See Comments)    REACTION: rhabdomyolysis  . Penicillins Other (See Comments)    SYNCOPE Has patient had a PCN reaction causing immediate rash, facial/tongue/throat swelling, SOB or lightheadedness with hypotension: Yes Has patient had a PCN reaction causing severe rash involving mucus membranes or skin necrosis: No Has patient had a PCN reaction that required hospitalization No Has patient had a PCN reaction occurring within the last 10 years: No If all of the above answers are "NO", then may proceed with Cephalosporin use.   . Latex Rash    Immunization History  Administered Date(s) Administered  . Influenza Split 10/14/2010, 10/13/2011  . Influenza Whole 10/02/2007, 10/14/2008, 10/16/2009  . Influenza, High Dose Seasonal PF 10/09/2016  . Influenza, Seasonal, Injecte, Preservative Fre 11/01/2013  . Influenza,inj,Quad PF,6+ Mos 10/17/2012, 10/17/2015  . Pneumococcal Conjugate-13 10/17/2012, 01/03/2013  . Pneumococcal  Polysaccharide-23 09/12/2006, 07/27/2016  . Td 12/05/2007  . Zoster 10/14/2010    Past Medical History:  Diagnosis Date  . Anemia    hx  . Anxiety   . Arthritis   . Asthma   . Atrial fibrillation and flutter ([place])   . Atypical lobular hyperplasia of right breast 05/09/2015  . Breast CA ([place])    ?  Marland Kitchen Chronic rhinitis   . COPD (chronic obstructive pulmonary disease) ([place])    [place]. PFTs 07/25/06 FEV1 55% ratio 53, DLC0 51% HFA 50% 12/16/2009  . Depression   . FRACTURE, RIB, RIGHT 06/18/2009  . Glucose intolerance (impaired glucose tolerance)    on steroids  . History of DVT (deep vein thrombosis)   . Hx of cardiac catheterization    LHC (01/2001):  Normal Cors.  EF 65%.;  LexiScan Myoview (03/2013): No ischemia, EF 61%, normal  . Hx of echocardiogram    Echo (11/2011):  Mod LVH, EF 60-65%, no RWMA, MAC, mild LAE, PASP 34.  Marland Kitchen HYPERLIPIDEMIA   . Hypertension   . Hypothyroidism   . Morbid obesity ([place])    target wt=179lb for BMI<30 Peak wt 282lb  . Osteoporosis    last BMD 4/08 -2.4, intolerant of bisphosphonates  . Peripheral vascular disease ([facility])    hx dvt.pe  . Pneumonia    hx  . PONV (postoperative nausea and vomiting)   . PREMATURE VENTRICULAR CONTRACTIONS   . PULMONARY EMBOLISM, HX OF 06/19/2007  . Rhabdomyolysis 07/29/2009  . Tremor   .  VITAMIN D DEFICIENCY     Tobacco History: Social History   Tobacco Use  Smoking Status Former Smoker  . Packs/day: 1.00  . Years: 35.00  . Pack years: 35.00  . Types: Cigarettes  . Last attempt to quit: 01/12/1996  . Years since quitting: 20.8  Smokeless Tobacco Never Used   Counseling given: Not Answered   Outpatient Encounter Medications as of 11/26/2016  Medication Sig  . acetaminophen (TYLENOL) 650 MG CR tablet Take 650-1,300 mg by mouth as needed for pain. Per bottle as needed  . albuterol (PROAIR HFA) 108 (90 Base) MCG/ACT inhaler Inhale 2 puffs into the lungs every 6 (six) hours as needed for wheezing or shortness of  breath.  Marland Kitchen apixaban (ELIQUIS) 5 MG TABS tablet Take 1 tablet (5 mg total) by mouth 2 (two) times daily.  Marland Kitchen atorvastatin (LIPITOR) 10 MG tablet Take 1 tablet (10 mg total) by mouth daily.  . budesonide-formoterol (SYMBICORT) 160-4.5 MCG/ACT inhaler Inhale 2 puffs into the lungs 2 (two) times daily.  . Calcium Carbonate-Vitamin D 600-400 MG-UNIT per tablet Take 1 tablet by mouth 2 (two) times daily.    Marland Kitchen Dextromethorphan-Guaifenesin (MUCINEX DM MAXIMUM STRENGTH) 60-1200 MG TB12 Take 1 tablet by mouth every 12 (twelve) hours as needed (cough/congestion). With flutter  . diltiazem (CARDIZEM CD) 360 MG 24 hr capsule Take 1 capsule (360 mg total) by mouth daily.  . ferrous sulfate 325 (65 FE) MG tablet Take 325 mg by mouth 2 (two) times daily with a meal.   . FLUoxetine (PROZAC) 40 MG capsule TAKE 1 CAPSULE DAILY (Patient taking differently: TAKE 40 mg CAPSULE DAILY)  . fluticasone (FLONASE) 50 MCG/ACT nasal spray Place 2 sprays into both nostrils 2 (two) times daily as needed for allergies or rhinitis.  . furosemide (LASIX) 80 MG tablet Take 1 tablet (80 mg total) by mouth 2 (two) times daily.  Marland Kitchen glucosamine-chondroitin 500-400 MG tablet Take 1 tablet by mouth every morning.   Marland Kitchen KLOR-CON M20 20 MEQ tablet TAKE 1 TABLET TWICE A DAY. PLEASE SCHEDULE APPOINTMENT FOR REFILLS  . levalbuterol (XOPENEX) 1.25 MG/3ML nebulizer solution Take 1.25 mg by nebulization every 4 (four) hours.  Marland Kitchen levothyroxine (SYNTHROID, LEVOTHROID) 25 MCG tablet TAKE 1 TABLET DAILY BEFORE BREAKFAST  . metoprolol tartrate (LOPRESSOR) 25 MG tablet Take 1 tablet (25 mg total) by mouth 2 (two) times daily.  . Olopatadine HCl (PATADAY) 0.2 % SOLN Place 1 drop into both eyes daily.  Marland Kitchen omeprazole (PRILOSEC) 20 MG capsule Take 50ms before supper daily  . OXYGEN Inhale 2.5 L/min into the lungs continuous.  . propafenone (RYTHMOL SR) 325 MG 12 hr capsule Take 1 capsule (325 mg total) by mouth 2 (two) times daily.  .Marland Kitchensenna-docusate  (SENOKOT-S) 8.6-50 MG tablet Take 1 tablet by mouth at bedtime as needed for mild constipation.  . traMADol (ULTRAM) 50 MG tablet Take 1-2 tablets (50-100 mg total) by mouth every 4 (four) hours as needed.  . predniSONE (DELTASONE) 10 MG tablet 4 tabs for 2 days, then 3 tabs for 2 days, 2 tabs for 2 days, then 1 tab for 2 days, then stop   No facility-administered encounter medications on file as of 11/26/2016.      Review of Systems  Constitutional:   No  weight loss, night sweats,  Fevers, chills,  +fatigue, or  lassitude.  HEENT:   No headaches,  Difficulty swallowing,  Tooth/dental problems, or  Sore throat,  No sneezing, itching, ear ache, nasal congestion, post nasal drip,   CV:  No chest pain,  Orthopnea, PND, swelling in lower extremities, anasarca, dizziness, palpitations, syncope.   GI  No heartburn, indigestion, abdominal pain, nausea, vomiting, diarrhea, change in bowel habits, loss of appetite, bloody stools.   Resp:    No chest wall deformity  Skin: no rash or lesions.  GU: no dysuria, change in color of urine, no urgency or frequency.  No flank pain, no hematuria   MS:  No joint pain or swelling.  No decreased range of motion.  No back pain.    Physical Exam  BP 122/74 (BP Location: Left Arm, Cuff Size: Large)   Pulse 82   Ht _0  (1.575 m)   Wt 259 lb (117.5 kg)   SpO2 97%   BMI 47.37 kg/m   GEN: A/Ox3; pleasant , NAD, elderly , obese on o2    HEENT:  [place]/AT,  EACs-clear, TMs-wnl, NOSE-clear, THROAT-clear, no lesions, no postnasal drip or exudate noted.   NECK:  Supple w/ fair ROM; no JVD; normal carotid impulses w/o bruits; no thyromegaly or nodules palpated; no lymphadenopathy.    RESP  Decreased BS in bases ,   no accessory muscle use, no dullness to percussion  CARD:  RRR, no m/r/g, no peripheral edema, pulses intact, no cyanosis or clubbing.  GI:   Soft & nt; nml bowel sounds; no organomegaly or masses detected.   Musco: Warm bil,  no deformities or joint swelling noted.   Neuro: alert, no focal deficits noted.    Skin: Warm, no lesions or rashes    Lab Results:  CBC  BNP   Imaging: No results found.   Assessment & Plan:   COPD GOLD II Mild flare with URI/AR  Hold on abx for now  Trial of LAMA   Patient's medications were reviewed today and patient education was given. Computerized medication calendar was adjusted/completed    Plan  Patient Instructions  Prednisone taper over the next week Begin Spiriva 2 puffs daily Follow  med calendar closely and bring to each visit.  Follow up Dr. Melvyn Novas  In 2 months and As needed   Please contact office for sooner follow up if symptoms do not improve or worsen or seek emergency care.         Chronic respiratory failure with hypoxia and hypercapnia (HCC) Cont on o2   Chronic rhinitis Flare   Plan  Use zytec , astelin As needed       Rexene Edison, NP 11/26/2016

## 2016-11-26 NOTE — Patient Instructions (Addendum)
Prednisone taper over the next week Begin Spiriva 2 puffs daily Follow  med calendar closely and bring to each visit.  Follow up Dr. Melvyn Novas  In 2 months and As needed   Please contact office for sooner follow up if symptoms do not improve or worsen or seek emergency care.

## 2016-11-26 NOTE — Progress Notes (Signed)
Patient seen in the office today and instructed on use of Spiriva Resp 1.35mcg.  Patient expressed understanding and demonstrated technique. Parke Poisson, CMA 11/26/16

## 2016-11-28 NOTE — Progress Notes (Signed)
Chart and office note reviewed in detail  > agree with a/p as outlined    

## 2016-11-29 ENCOUNTER — Telehealth: Payer: Self-pay | Admitting: Adult Health

## 2016-11-29 MED ORDER — TIOTROPIUM BROMIDE MONOHYDRATE 1.25 MCG/ACT IN AERS: 2.0000 | INHALATION_SPRAY | Freq: Every day | RESPIRATORY_TRACT | 3 refills | Status: DC

## 2016-11-29 NOTE — Telephone Encounter (Signed)
Discussed results of sleep study with patient.  Recommended proceeding with CPAP titration to help with afib suppresion and CHF - patient is not interested in proceeding at this time.

## 2016-11-29 NOTE — Telephone Encounter (Signed)
Called pt and advised message from the provider. Pt understood and verbalized understanding. Nothing further is needed.   She agreed to take the Spiriva every other day. Janett Billow can you please change this on her med calender for me. Thanks.

## 2016-11-29 NOTE — Telephone Encounter (Signed)
I am unable to adjust meds on med calendar Routing to TP

## 2016-11-29 NOTE — Telephone Encounter (Signed)
Called and spoke with pt and she stated that she has been taking the spiriva and she stated that her tremors are terrible.  She stated that she cannot drive like this---with all of the shaking and tremors.  She stated that she feels so much better and that her cough has cleared up on the spiriva and the pred that she was taking.  She wanted to find out if TP wants to call her something else in or what she needs to do.  TP please advise. Thanks  Allergies  Allergen Reactions  . Duloxetine Other (See Comments)    REACTION: rhabdomyolysis  . Penicillins Other (See Comments)    SYNCOPE Has patient had a PCN reaction causing immediate rash, facial/tongue/throat swelling, SOB or lightheadedness with hypotension: Yes Has patient had a PCN reaction causing severe rash involving mucus membranes or skin necrosis: No Has patient had a PCN reaction that required hospitalization No Has patient had a PCN reaction occurring within the last 10 years: No If all of the above answers are "NO", then may proceed with Cephalosporin use.   . Latex Rash

## 2016-11-29 NOTE — Telephone Encounter (Signed)
Glad she is feeling better with breathing  Couple of options, she did not tolerate Caprice Renshaw which is alternative to spiriva  She could try to take every other day , also could decrease to lower dose. If she would like to try to stay on this .  Please contact office for sooner follow up if symptoms do not improve or worsen or seek emergency care

## 2016-11-29 NOTE — Addendum Note (Signed)
Addended by: Desmond Dike C on: 11/29/2016 03:49 PM   Modules accepted: Orders

## 2016-11-30 NOTE — Telephone Encounter (Signed)
Per TP: we can adjust the med calendar the next time she's in the office.

## 2016-12-10 ENCOUNTER — Ambulatory Visit: Payer: BLUE CROSS/BLUE SHIELD | Admitting: Adult Health

## 2016-12-13 ENCOUNTER — Other Ambulatory Visit: Payer: Self-pay | Admitting: Cardiology

## 2017-01-06 NOTE — Telephone Encounter (Signed)
  Lula Olszewski, CMA        Pt can be scheduled. Let me know when so I can enter precert info.  Thanks,  Amy

## 2017-01-10 ENCOUNTER — Telehealth: Payer: Self-pay | Admitting: *Deleted

## 2017-01-10 NOTE — Telephone Encounter (Signed)
KEY #[id number] FOR COVERMYMEDS   [facility] SCRIPT SENT A FORM --STARTING RENEWAL FOR ELIQUIS 2019  COMPLETED PRIOR AUTHORIZATION-  APPROVED TODAY  DX  ATRIAL FLUTTER  I48.92

## 2017-01-19 ENCOUNTER — Ambulatory Visit (INDEPENDENT_AMBULATORY_CARE_PROVIDER_SITE_OTHER): Payer: BLUE CROSS/BLUE SHIELD | Admitting: Internal Medicine

## 2017-01-19 ENCOUNTER — Encounter: Payer: Self-pay | Admitting: Internal Medicine

## 2017-01-19 VITALS — BP 144/88 | HR 92 | Temp 97.7°F | Ht 62.0 in | Wt 260.0 lb

## 2017-01-19 DIAGNOSIS — I10 Essential (primary) hypertension: Secondary | ICD-10-CM

## 2017-01-19 DIAGNOSIS — Z7189 Other specified counseling: Secondary | ICD-10-CM | POA: Diagnosis not present

## 2017-01-19 DIAGNOSIS — J449 Chronic obstructive pulmonary disease, unspecified: Secondary | ICD-10-CM | POA: Diagnosis not present

## 2017-01-19 DIAGNOSIS — R7302 Impaired glucose tolerance (oral): Secondary | ICD-10-CM

## 2017-01-19 NOTE — Patient Instructions (Signed)
Your DNR form is signed today  Please continue all other medications as before, and refills have been done if requested.  Please have the pharmacy call with any other refills you may need.  Please continue your efforts at being more active, low cholesterol diet, and weight control.  You are otherwise up to date with prevention measures today.  Please keep your appointments with your specialists as you may have planned  Please return in 4 months, or sooner if needed

## 2017-01-19 NOTE — Progress Notes (Signed)
Subjective:    Patient ID: [name] [name], female    DOB: 1950-08-25, 67 y.o.   MRN: [id number]  HPI    Here to f/u; overall doing ok,  Pt denies chest pain, increasing sob or doe, wheezing, orthopnea, PND, increased LE swelling, palpitations, dizziness or syncope.  Pt denies new neurological symptoms such as new headache, or facial or extremity weakness or numbness.  Pt denies polydipsia, polyuria, or low sugar episode.  Pt states overall good compliance with meds, mostly trying to follow appropriate diet.   No longer driving, has worsening bilat hand tremors left > right,somewhat worse with symbicort use.   Has been overwhelmed and more depressed nervous recently as children dont see her for the holidays and cannot put up with their drama.  Declines med changes today   No Hi or SI.   Has appt later this wk with cardiology, and next wk  Had persistent chronic shakiness now resolved after addition of xopenex nebs per pt.  No other complaint or interval hx Past Medical History:  Diagnosis Date  . Anemia    hx  . Anxiety   . Arthritis   . Asthma   . Atrial fibrillation and flutter ([place])   . Atypical lobular hyperplasia of right breast 05/09/2015  . Breast CA ([place])    ?  Marland Kitchen Chronic rhinitis   . COPD (chronic obstructive pulmonary disease) ([place])    [facility]ert. PFTs 07/25/06 FEV1 55% ratio 53, DLC0 51% HFA 50% 12/16/2009  . Depression   . FRACTURE, RIB, RIGHT 06/18/2009  . Glucose intolerance (impaired glucose tolerance)    on steroids  . History of DVT (deep vein thrombosis)   . Hx of cardiac catheterization    LHC (01/2001):  Normal Cors.  EF 65%.;  LexiScan Myoview (03/2013): No ischemia, EF 61%, normal  . Hx of echocardiogram    Echo (11/2011):  Mod LVH, EF 60-65%, no RWMA, MAC, mild LAE, PASP 34.  Marland Kitchen HYPERLIPIDEMIA   . Hypertension   . Hypothyroidism   . Morbid obesity ([place])    target wt=179lb for BMI<30 Peak wt 282lb  . Osteoporosis    last BMD 4/08 -2.4, intolerant of bisphosphonates  .  Peripheral vascular disease (HCC)    hx dvt.pe  . Pneumonia    hx  . PONV (postoperative nausea and vomiting)   . PREMATURE VENTRICULAR CONTRACTIONS   . PULMONARY EMBOLISM, HX OF 06/19/2007  . Rhabdomyolysis 07/29/2009  . Tremor   . VITAMIN D DEFICIENCY    Past Surgical History:  Procedure Laterality Date  . A-FLUTTER ABLATION N/A 10/08/2016   Procedure: A-FLUTTER ABLATION;  Surgeon: Evans Lance, MD;  Location: [place] [facility];  Service: Cardiovascular;  Laterality: N/A;  . ABDOMINAL HYSTERECTOMY    . BREAST LUMPECTOMY WITH RADIOACTIVE SEED LOCALIZATION Right 06/05/2015   Procedure: RIGHT BREAST LUMPECTOMY WITH RADIOACTIVE SEED LOCALIZATION;  Surgeon: Fanny Skates, MD;  Location: [place];  Service: General;  Laterality: Right;  . BREAST SURGERY Left    s/p mass removal  . CARDIOVERSION N/A 12/11/2014   Procedure: CARDIOVERSION;  Surgeon: Josue Hector, MD;  Location: [place];  Service: Cardiovascular;  Laterality: N/A;  . CARDIOVERSION N/A 09/15/2016   Procedure: CARDIOVERSION;  Surgeon: Jerline Pain, MD;  Location: [facility] ENDOSCOPY;  Service: Cardiovascular;  Laterality: N/A;  . COLONOSCOPY  10/22/2003, ?2006  . COLONOSCOPY WITH PROPOFOL N/A 01/15/2016   Procedure: COLONOSCOPY WITH PROPOFOL;  Surgeon: Gatha Mayer, MD;  Location: [facility] ENDOSCOPY;  Service: Endoscopy;  Laterality: N/A;  . ESOPHAGOGASTRODUODENOSCOPY (EGD) WITH PROPOFOL N/A 01/15/2016   Procedure: ESOPHAGOGASTRODUODENOSCOPY (EGD) WITH PROPOFOL;  Surgeon: Gatha Mayer, MD;  Location: [facility] ENDOSCOPY;  Service: Endoscopy;  Laterality: N/A;  . s/p left arm fracture with fall off chair    . skin graft to middle R finger  1975  . TEE WITHOUT CARDIOVERSION N/A 12/11/2014   Procedure: TRANSESOPHAGEAL ECHOCARDIOGRAM (TEE);  Surgeon: Josue Hector, MD;  Location: [facility] ENDOSCOPY;  Service: Cardiovascular;  Laterality: N/A;  . TONSILLECTOMY      reports that she quit smoking about 21 years ago. Her smoking use included cigarettes.  She has a 35.00 pack-year smoking history. she has never used smokeless tobacco. She reports that she does not drink alcohol or use drugs. family history includes Asthma in her brother and son; Esophageal cancer in her brother; Heart disease in her mother; Liver disease in her mother. Allergies  Allergen Reactions  . Duloxetine Other (See Comments)    REACTION: rhabdomyolysis  . Penicillins Other (See Comments)    SYNCOPE Has patient had a PCN reaction causing immediate rash, facial/tongue/throat swelling, SOB or lightheadedness with hypotension: Yes Has patient had a PCN reaction causing severe rash involving mucus membranes or skin necrosis: No Has patient had a PCN reaction that required hospitalization No Has patient had a PCN reaction occurring within the last 10 years: No If all of the above answers are "NO", then may proceed with Cephalosporin use.   . Latex Rash   Current Outpatient Medications on File Prior to Visit  Medication Sig Dispense Refill  . acetaminophen (TYLENOL) 650 MG CR tablet Take 650-1,300 mg by mouth as needed for pain. Per bottle as needed    . apixaban (ELIQUIS) 5 MG TABS tablet Take 1 tablet (5 mg total) by mouth 2 (two) times daily. 180 tablet 1  . atorvastatin (LIPITOR) 10 MG tablet Take 1 tablet (10 mg total) by mouth daily. 90 tablet 2  . azelastine (ASTELIN) 0.1 % nasal spray Place 2 sprays at bedtime as needed into both nostrils for rhinitis. Use in each nostril as directed    . budesonide-formoterol (SYMBICORT) 160-4.5 MCG/ACT inhaler Inhale 2 puffs 2 (two) times daily into the lungs. 3 Inhaler 1  . Calcium Carbonate-Vitamin D 600-400 MG-UNIT per tablet Take 1 tablet by mouth 2 (two) times daily.      . cetirizine (ZYRTEC) 10 MG tablet Take 10 mg daily as needed by mouth for allergies.    Marland Kitchen dextromethorphan (DELSYM) 30 MG/5ML liquid Take 2 tsp twice daily as needed    . Dextromethorphan-Guaifenesin (MUCINEX DM MAXIMUM STRENGTH) 60-1200 MG TB12 Take 1  tablet by mouth every 12 (twelve) hours as needed (cough/congestion). With flutter    . ferrous sulfate 325 (65 FE) MG tablet Take 325 mg by mouth 2 (two) times daily with a meal.     . FLUoxetine (PROZAC) 40 MG capsule TAKE 1 CAPSULE DAILY (Patient taking differently: TAKE 40 mg CAPSULE DAILY) 90 capsule 3  . fluticasone (FLONASE) 50 MCG/ACT nasal spray Place 2 sprays into both nostrils 2 (two) times daily as needed for allergies or rhinitis.    . furosemide (LASIX) 80 MG tablet TAKE ONE AND ONE-HALF TABLETS IN THE MORNING AND 1 TABLET IN THE AFTERNOON 225 tablet 0  . glucosamine-chondroitin 500-400 MG tablet Take 1 tablet by mouth every morning.     Marland Kitchen KLOR-CON M20 20 MEQ tablet TAKE 1 TABLET TWICE A DAY. PLEASE SCHEDULE APPOINTMENT FOR REFILLS  180 tablet 0  . levalbuterol (XOPENEX) 1.25 MG/3ML nebulizer solution Take 1.25 mg by nebulization every 4 (four) hours. 360 mL 11  . levothyroxine (SYNTHROID, LEVOTHROID) 25 MCG tablet TAKE 1 TABLET DAILY BEFORE BREAKFAST 90 tablet 0  . metoprolol tartrate (LOPRESSOR) 25 MG tablet Take 1 tablet (25 mg total) by mouth 2 (two) times daily. 60 tablet 0  . Olopatadine HCl (PATADAY) 0.2 % SOLN Place 1 drop into both eyes daily.    Marland Kitchen omeprazole (PRILOSEC) 20 MG capsule Take 30ms before supper daily    . OXYGEN Inhale 2.5 L/min into the lungs continuous.    . propafenone (RYTHMOL SR) 325 MG 12 hr capsule Take 1 capsule (325 mg total) by mouth 2 (two) times daily. 180 capsule 3  . sodium chloride (OCEAN) 0.65 % SOLN nasal spray Place 2 sprays every 4 (four) hours as needed into both nostrils for congestion.    . Tiotropium Bromide Monohydrate (SPIRIVA RESPIMAT) 1.25 MCG/ACT AERS Inhale 2 puffs daily into the lungs. (Patient taking differently: Inhale 2 puffs into the lungs 3 (three) times a week. Monday, Wednesday and Friday) 1 Inhaler 3   No current facility-administered medications on file prior to visit.    Review of Systems  Constitutional: Negative for  other unusual diaphoresis or sweats HENT: Negative for ear discharge or swelling Eyes: Negative for other worsening visual disturbances Respiratory: Negative for stridor or other swelling  Gastrointestinal: Negative for worsening distension or other blood Genitourinary: Negative for retention or other urinary change Musculoskeletal: Negative for other MSK pain or swelling Skin: Negative for color change or other new lesions Neurological: Negative for worsening tremors and other numbness  Psychiatric/Behavioral: Negative for worsening agitation or other fatigue All other system neg per pt    Objective:   Physical Exam BP (!) 144/88   Pulse 92   Temp 97.7 F (36.5 C) (Oral)   Ht 5' 2"  (1.575 m)   Wt 260 lb (117.9 kg)   SpO2 94%   BMI 47.55 kg/m  VS noted,  Constitutional: Pt appears in NAD HENT: Head: NCAT.  Right Ear: External ear normal.  Left Ear: External ear normal.  Eyes: . Pupils are equal, round, and reactive to light. Conjunctivae and EOM are normal Nose: without d/c or deformity Neck: Neck supple. Gross normal ROM Cardiovascular: Normal rate and regular rhythm.   Pulmonary/Chest: Effort normal and breath sounds without rales or wheezing.  Abd:  Soft, NT, ND, + BS, no organomegaly Neurological: Pt is alert. At baseline orientation, motor grossly intact Skin: Skin is warm. No rashes, other new lesions, no LE edema Psychiatric: Pt behavior is normal without agitation  No other exam findings Lab Results  Component Value Date   WBC 6.7 11/08/2016   HGB 10.7 (L) 11/08/2016   HCT 31.2 (L) 11/08/2016   PLT 216.0 11/08/2016   GLUCOSE 160 (H) 11/08/2016   CHOL 125 11/08/2016   TRIG 105.0 11/08/2016   HDL 35.80 (L) 11/08/2016   LDLDIRECT 140.2 04/10/2010   LDLCALC 68 11/08/2016   ALT 7 11/08/2016   AST 9 11/08/2016   NA 146 (H) 11/08/2016   K 3.6 11/08/2016   CL 100 11/08/2016   CREATININE 0.56 11/08/2016   BUN 9 11/08/2016   CO2 39 (H) 11/08/2016   TSH 4.59 (H)  11/08/2016   INR 1.15 10/07/2016   HGBA1C 4.7 11/08/2016      Assessment & Plan:

## 2017-01-21 ENCOUNTER — Ambulatory Visit (INDEPENDENT_AMBULATORY_CARE_PROVIDER_SITE_OTHER): Payer: BLUE CROSS/BLUE SHIELD | Admitting: Internal Medicine

## 2017-01-21 ENCOUNTER — Encounter: Payer: Self-pay | Admitting: Internal Medicine

## 2017-01-21 VITALS — BP 148/82 | HR 93 | Ht 62.0 in | Wt 258.0 lb

## 2017-01-21 DIAGNOSIS — Z9889 Other specified postprocedural states: Secondary | ICD-10-CM | POA: Diagnosis not present

## 2017-01-21 DIAGNOSIS — I4892 Unspecified atrial flutter: Secondary | ICD-10-CM | POA: Diagnosis not present

## 2017-01-21 NOTE — Assessment & Plan Note (Signed)
stable overall by history and exam, and pt to continue medical treatment as before,  to f/u any worsening symptoms or concerns 

## 2017-01-21 NOTE — Patient Instructions (Signed)

## 2017-01-21 NOTE — Assessment & Plan Note (Signed)
Mild elevated, o/w stable overall by history and exam, recent data reviewed with pt, and pt to continue medical treatment as before as she declines any change to tx,  to f/u any worsening symptoms or concerns BP Readings from Last 3 Encounters:  01/21/17 (!) 148/82  01/19/17 (!) 144/88  11/26/16 122/74

## 2017-01-21 NOTE — Progress Notes (Signed)
HPI [name] [name] returns today for followup. She is a pleasant 67 yo woman with a h/o HTN, atrial flutter, s/p ablation, who developed atrial fib and has been treated with rhythmol. She has not had any symptomatic atrial fib.No chest. She has improved after starting home oxygen. She is able to ambulate with oxygen with out desaturating. Minimal peripheral edema.  Allergies  Allergen Reactions  . Duloxetine Other (See Comments)    REACTION: rhabdomyolysis  . Penicillins Other (See Comments)    SYNCOPE Has patient had a PCN reaction causing immediate rash, facial/tongue/throat swelling, SOB or lightheadedness with hypotension: Yes Has patient had a PCN reaction causing severe rash involving mucus membranes or skin necrosis: No Has patient had a PCN reaction that required hospitalization No Has patient had a PCN reaction occurring within the last 10 years: No If all of the above answers are "NO", then may proceed with Cephalosporin use.   . Latex Rash     Current Outpatient Medications  Medication Sig Dispense Refill  . acetaminophen (TYLENOL) 650 MG CR tablet Take 650-1,300 mg by mouth as needed for pain. Per bottle as needed    . apixaban (ELIQUIS) 5 MG TABS tablet Take 1 tablet (5 mg total) by mouth 2 (two) times daily. 180 tablet 1  . atorvastatin (LIPITOR) 10 MG tablet Take 1 tablet (10 mg total) by mouth daily. 90 tablet 2  . azelastine (ASTELIN) 0.1 % nasal spray Place 2 sprays at bedtime as needed into both nostrils for rhinitis. Use in each nostril as directed    . budesonide-formoterol (SYMBICORT) 160-4.5 MCG/ACT inhaler Inhale 2 puffs 2 (two) times daily into the lungs. 3 Inhaler 1  . Calcium Carbonate-Vitamin D 600-400 MG-UNIT per tablet Take 1 tablet by mouth 2 (two) times daily.      . cetirizine (ZYRTEC) 10 MG tablet Take 10 mg daily as needed by mouth for allergies.    Marland Kitchen dextromethorphan (DELSYM) 30 MG/5ML liquid Take 2 tsp twice daily as needed    .  Dextromethorphan-Guaifenesin (MUCINEX DM MAXIMUM STRENGTH) 60-1200 MG TB12 Take 1 tablet by mouth every 12 (twelve) hours as needed (cough/congestion). With flutter    . ferrous sulfate 325 (65 FE) MG tablet Take 325 mg by mouth 2 (two) times daily with a meal.     . FLUoxetine (PROZAC) 40 MG capsule TAKE 1 CAPSULE DAILY (Patient taking differently: TAKE 40 mg CAPSULE DAILY) 90 capsule 3  . fluticasone (FLONASE) 50 MCG/ACT nasal spray Place 2 sprays into both nostrils 2 (two) times daily as needed for allergies or rhinitis.    . furosemide (LASIX) 80 MG tablet TAKE ONE AND ONE-HALF TABLETS IN THE MORNING AND 1 TABLET IN THE AFTERNOON 225 tablet 0  . glucosamine-chondroitin 500-400 MG tablet Take 1 tablet by mouth every morning.     Marland Kitchen KLOR-CON M20 20 MEQ tablet TAKE 1 TABLET TWICE A DAY. PLEASE SCHEDULE APPOINTMENT FOR REFILLS 180 tablet 0  . levalbuterol (XOPENEX) 1.25 MG/3ML nebulizer solution Take 1.25 mg by nebulization every 4 (four) hours. 360 mL 11  . levothyroxine (SYNTHROID, LEVOTHROID) 25 MCG tablet TAKE 1 TABLET DAILY BEFORE BREAKFAST 90 tablet 0  . metoprolol tartrate (LOPRESSOR) 25 MG tablet Take 1 tablet (25 mg total) by mouth 2 (two) times daily. 60 tablet 0  . Olopatadine HCl (PATADAY) 0.2 % SOLN Place 1 drop into both eyes daily.    Marland Kitchen omeprazole (PRILOSEC) 20 MG capsule Take 37ms before supper daily    .  OXYGEN Inhale 2.5 L/min into the lungs continuous.    . propafenone (RYTHMOL SR) 325 MG 12 hr capsule Take 1 capsule (325 mg total) by mouth 2 (two) times daily. 180 capsule 3  . sodium chloride (OCEAN) 0.65 % SOLN nasal spray Place 2 sprays every 4 (four) hours as needed into both nostrils for congestion.    . Tiotropium Bromide Monohydrate (SPIRIVA RESPIMAT) 1.25 MCG/ACT AERS Inhale 2 puffs daily into the lungs. (Patient taking differently: Inhale 2 puffs into the lungs 3 (three) times a week. Monday, Wednesday and Friday) 1 Inhaler 3   No current facility-administered medications  for this visit.      Past Medical History:  Diagnosis Date  . Anemia    hx  . Anxiety   . Arthritis   . Asthma   . Atrial fibrillation and flutter ([place])   . Atypical lobular hyperplasia of right breast 05/09/2015  . Breast CA ([place])    ?  Marland Kitchen Chronic rhinitis   . COPD (chronic obstructive pulmonary disease) ([place])    [facility]. PFTs 07/25/06 FEV1 55% ratio 53, DLC0 51% HFA 50% 12/16/2009  . Depression   . FRACTURE, RIB, RIGHT 06/18/2009  . Glucose intolerance (impaired glucose tolerance)    on steroids  . History of DVT (deep vein thrombosis)   . Hx of cardiac catheterization    LHC (01/2001):  Normal Cors.  EF 65%.;  LexiScan Myoview (03/2013): No ischemia, EF 61%, normal  . Hx of echocardiogram    Echo (11/2011):  Mod LVH, EF 60-65%, no RWMA, MAC, mild LAE, PASP 34.  Marland Kitchen HYPERLIPIDEMIA   . Hypertension   . Hypothyroidism   . Morbid obesity ([place])    target wt=179lb for BMI<30 Peak wt 282lb  . Osteoporosis    last BMD 4/08 -2.4, intolerant of bisphosphonates  . Peripheral vascular disease (HCC)    hx dvt.pe  . Pneumonia    hx  . PONV (postoperative nausea and vomiting)   . PREMATURE VENTRICULAR CONTRACTIONS   . PULMONARY EMBOLISM, HX OF 06/19/2007  . Rhabdomyolysis 07/29/2009  . Tremor   . VITAMIN D DEFICIENCY     ROS:   All systems reviewed and negative except as noted in the HPI.   Past Surgical History:  Procedure Laterality Date  . A-FLUTTER ABLATION N/A 10/08/2016   Procedure: A-FLUTTER ABLATION;  Surgeon: Evans Lance, MD;  Location: [place] [facility];  Service: Cardiovascular;  Laterality: N/A;  . ABDOMINAL HYSTERECTOMY    . BREAST LUMPECTOMY WITH RADIOACTIVE SEED LOCALIZATION Right 06/05/2015   Procedure: RIGHT BREAST LUMPECTOMY WITH RADIOACTIVE SEED LOCALIZATION;  Surgeon: Fanny Skates, MD;  Location: [place];  Service: General;  Laterality: Right;  . BREAST SURGERY Left    s/p mass removal  . CARDIOVERSION N/A 12/11/2014   Procedure: CARDIOVERSION;  Surgeon: Josue Hector, MD;  Location: [place];  Service: Cardiovascular;  Laterality: N/A;  . CARDIOVERSION N/A 09/15/2016   Procedure: CARDIOVERSION;  Surgeon: Jerline Pain, MD;  Location: [facility] ENDOSCOPY;  Service: Cardiovascular;  Laterality: N/A;  . COLONOSCOPY  10/22/2003, ?2006  . COLONOSCOPY WITH PROPOFOL N/A 01/15/2016   Procedure: COLONOSCOPY WITH PROPOFOL;  Surgeon: Gatha Mayer, MD;  [facility] ENDOSCOPY;  Service: Endoscopy;  Laterality: N/A;  . ESOPHAGOGASTRODUODENOSCOPY (EGD) WITH PROPOFOL N/A 01/15/2016   Procedure: ESOPHAGOGASTRODUODENOSCOPY (EGD) WITH PROPOFOL;  Surgeon: Gatha Mayer, MD;  [facility] ENDOSCOPY;  Service: Endoscopy;  Laterality: N/A;  . s/p left arm fracture with fall off chair    .  skin graft to middle R finger  1975  . TEE WITHOUT CARDIOVERSION N/A 12/11/2014   Procedure: TRANSESOPHAGEAL ECHOCARDIOGRAM (TEE);  Surgeon: Josue Hector, MD;  Location: [facility] ENDOSCOPY;  Service: Cardiovascular;  Laterality: N/A;  . TONSILLECTOMY       Family History  Problem Relation Age of Onset  . Heart disease Mother   . Liver disease Mother        alcohol related  . Asthma Brother   . Esophageal cancer Brother   . Asthma Son   . Colon cancer Neg Hx   . Stomach cancer Neg Hx   . Pancreatic cancer Neg Hx   . Inflammatory bowel disease Neg Hx      Social History   Socioeconomic History  . Marital status: Married    Spouse name: Not on file  . Number of children: 3  . Years of education: Not on file  . Highest education level: Not on file  Social Needs  . Financial resource strain: Not on file  . Food insecurity - worry: Not on file  . Food insecurity - inability: Not on file  . Transportation needs - medical: Not on file  . Transportation needs - non-medical: Not on file  Occupational History  . 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 Smoking status: Former Smoker    Packs/day: 1.00    Years: 35.00    Pack  years: 35.00    Types: Cigarettes    Last attempt to quit: 01/12/1996    Years since quitting: 21.0  . Smokeless tobacco: Never Used  Substance and Sexual Activity  . Alcohol use: No    Alcohol/week: 0.0 oz  . Drug use: No  . Sexual activity: Yes  Other Topics Concern  . Not on file  Social History Narrative   Married 1 son 2 daughters   Disabled   2 caffeine/day   Past smoker   11/12/2015     BP (!) 148/82   Pulse 93   Ht _0  (1.575 m)   Wt 258 lb (117 kg)   BMI 47.19 kg/m   Physical Exam:  stable appearing 67 yo woman, NAD HEENT: Unremarkable Neck:  No JVD, no thyromegally Lymphatics:  No adenopathy Back:  No CVA tenderness Lungs:  Clear with no wheezes HEART:  Regular rate rhythm, no murmurs, no rubs, no clicks Abd:  soft, positive bowel sounds, no organomegally, no rebound, no guarding Ext:  2 plus pulses, no edema, no cyanosis, no clubbing Skin:  No rashes no nodules Neuro:  CN II through XII intact, motor grossly intact  EKG - nsr   Assess/Plan: 1. Atrial flutter - she has had no arrhythmias since her ablation.  2. PAF - this was documented and has been well controlled on Rhythmol. 3. COPD - she requires oxygen. No change in her meds. 4. Obesity - I have asked her to reduce her calorie intake.  [email or web address] [email or web address].

## 2017-01-21 NOTE — Assessment & Plan Note (Signed)
stable overall by history and exam, recent data reviewed with pt, and pt to continue medical treatment as before,  to f/u any worsening symptoms or concerns Lab Results  Component Value Date   HGBA1C 4.7 11/08/2016

## 2017-01-21 NOTE — Assessment & Plan Note (Signed)
Pt initiated d/w pt regarding DNR, out of hosp form signed

## 2017-01-25 NOTE — Progress Notes (Signed)
HPI: FU AFib/flutter, prior pulmonary embolism, chronic coumadin, diastolic dysfunction, morbid obesity, COPD, LE edema. Cardiac cath in 2003 was normal. Nuclear study March 2015 showed an ejection fraction 61% and normal perfusion. Seen November 2016 by Dr. Martinique and noted to be in atrial flutter. Subsequently had TEE cardioversion 11/16; TEE revealed normal LV function; mild MR; mild biatrial enlargement. Had atrial flutter ablation September 2018. Last echocardiogram October 2018 shows normal LV function, grade 2 diastolic dysfunction, moderate aortic stenosis with mean gradient 20 mmHg, severe left atrial enlargement and moderately elevated pulmonary pressure. Recurrent atrial fibrillation October 2 018 requiring cardioversion. Since she was last seen  she does have some dyspnea on exertion which is unchanged and at baseline for her.  No orthopnea, PND, pedal edema, chest pain, palpitations or syncope.  Current Outpatient Medications  Medication Sig Dispense Refill  . acetaminophen (TYLENOL) 650 MG CR tablet Take 650-1,300 mg by mouth as needed for pain. Per bottle as needed    . albuterol (PROAIR HFA) 108 (90 Base) MCG/ACT inhaler Inhale 2 puffs into the lungs every 6 (six) hours as needed for wheezing or shortness of breath.    Marland Kitchen apixaban (ELIQUIS) 5 MG TABS tablet Take 1 tablet (5 mg total) by mouth 2 (two) times daily. 180 tablet 1  . atorvastatin (LIPITOR) 10 MG tablet Take 1 tablet (10 mg total) by mouth daily. 90 tablet 2  . azelastine (ASTELIN) 0.1 % nasal spray Place 2 sprays at bedtime as needed into both nostrils for rhinitis. Use in each nostril as directed    . budesonide-formoterol (SYMBICORT) 160-4.5 MCG/ACT inhaler Inhale 2 puffs 2 (two) times daily into the lungs. 3 Inhaler 1  . Calcium Carbonate-Vitamin D 600-400 MG-UNIT per tablet Take 1 tablet by mouth 2 (two) times daily.      . cetirizine (ZYRTEC) 10 MG tablet Take 10 mg daily as needed by mouth for allergies.    Marland Kitchen  dextromethorphan (DELSYM) 30 MG/5ML liquid Take 2 tsp twice daily as needed    . Dextromethorphan-Guaifenesin (MUCINEX DM MAXIMUM STRENGTH) 60-1200 MG TB12 Take 1 tablet by mouth every 12 (twelve) hours as needed (cough/congestion). With flutter    . ferrous sulfate 325 (65 FE) MG tablet Take 325 mg by mouth 2 (two) times daily with a meal.     . FLUoxetine (PROZAC) 40 MG capsule TAKE 1 CAPSULE DAILY (Patient taking differently: TAKE 40 mg CAPSULE DAILY) 90 capsule 3  . fluticasone (FLONASE) 50 MCG/ACT nasal spray Place 2 sprays into both nostrils 2 (two) times daily as needed for allergies or rhinitis.    . furosemide (LASIX) 80 MG tablet TAKE ONE AND ONE-HALF TABLETS IN THE MORNING AND 1 TABLET IN THE AFTERNOON 225 tablet 0  . glucosamine-chondroitin 500-400 MG tablet Take 1 tablet by mouth every morning.     Marland Kitchen KLOR-CON M20 20 MEQ tablet TAKE 1 TABLET TWICE A DAY. PLEASE SCHEDULE APPOINTMENT FOR REFILLS 180 tablet 0  . levalbuterol (XOPENEX) 1.25 MG/3ML nebulizer solution Take 1.25 mg by nebulization every 4 (four) hours. 360 mL 11  . levothyroxine (SYNTHROID, LEVOTHROID) 25 MCG tablet TAKE 1 TABLET DAILY BEFORE BREAKFAST 90 tablet 0  . metoprolol tartrate (LOPRESSOR) 25 MG tablet Take 1 tablet (25 mg total) by mouth 2 (two) times daily. 60 tablet 0  . Olopatadine HCl (PATADAY) 0.2 % SOLN Place 1 drop into both eyes daily.    Marland Kitchen omeprazole (PRILOSEC) 20 MG capsule Take 73ms before supper daily    .  OXYGEN Inhale 2.5 L/min into the lungs continuous.    . propafenone (RYTHMOL SR) 325 MG 12 hr capsule Take 1 capsule (325 mg total) by mouth 2 (two) times daily. 180 capsule 3  . sodium chloride (OCEAN) 0.65 % SOLN nasal spray Place 2 sprays every 4 (four) hours as needed into both nostrils for congestion.    . Tiotropium Bromide Monohydrate (SPIRIVA RESPIMAT) 1.25 MCG/ACT AERS Inhale 2 puffs daily into the lungs. (Patient taking differently: Inhale 2 puffs into the lungs 3 (three) times a week. Monday,  Wednesday and Friday) 1 Inhaler 3   No current facility-administered medications for this visit.      Past Medical History:  Diagnosis Date  . Anemia    hx  . Anxiety   . Arthritis   . Asthma   . Atrial fibrillation and flutter ([place])   . Atypical lobular hyperplasia of right breast 05/09/2015  . Breast CA ([place])    ?  Marland Kitchen Chronic rhinitis   . COPD (chronic obstructive pulmonary disease) ([place])    Wert. PFTs 07/25/06 FEV1 55% ratio 53, DLC0 51% HFA 50% 12/16/2009  . Depression   . FRACTURE, RIB, RIGHT 06/18/2009  . Glucose intolerance (impaired glucose tolerance)    on steroids  . History of DVT (deep vein thrombosis)   . Hx of cardiac catheterization    LHC (01/2001):  Normal Cors.  EF 65%.;  LexiScan Myoview (03/2013): No ischemia, EF 61%, normal  . Hx of echocardiogram    Echo (11/2011):  Mod LVH, EF 60-65%, no RWMA, MAC, mild LAE, PASP 34.  Marland Kitchen HYPERLIPIDEMIA   . Hypertension   . Hypothyroidism   . Morbid obesity ([place])    target wt=179lb for BMI<30 Peak wt 282lb  . Osteoporosis    last BMD 4/08 -2.4, intolerant of bisphosphonates  . Peripheral vascular disease ([facility])    hx dvt.pe  . Pneumonia    hx  . PONV (postoperative nausea and vomiting)   . PREMATURE VENTRICULAR CONTRACTIONS   . PULMONARY EMBOLISM, HX OF 06/19/2007  . Rhabdomyolysis 07/29/2009  . Tremor   . VITAMIN D DEFICIENCY     Past Surgical History:  Procedure Laterality Date  . A-FLUTTER ABLATION N/A 10/08/2016   Procedure: A-FLUTTER ABLATION;  Surgeon: Evans Lance, MD;  Location: [place] [facility];  Service: Cardiovascular;  Laterality: N/A;  . ABDOMINAL HYSTERECTOMY    . BREAST LUMPECTOMY WITH RADIOACTIVE SEED LOCALIZATION Right 06/05/2015   Procedure: RIGHT BREAST LUMPECTOMY WITH RADIOACTIVE SEED LOCALIZATION;  Surgeon: Fanny Skates, MD;  Location: [place];  Service: General;  Laterality: Right;  . BREAST SURGERY Left    s/p mass removal  . CARDIOVERSION N/A 12/11/2014   Procedure: CARDIOVERSION;  Surgeon:  Josue Hector, MD;  Location: [place];  Service: Cardiovascular;  Laterality: N/A;  . CARDIOVERSION N/A 09/15/2016   Procedure: CARDIOVERSION;  Surgeon: Jerline Pain, MD;  Location: [facility] ENDOSCOPY;  Service: Cardiovascular;  Laterality: N/A;  . COLONOSCOPY  10/22/2003, ?2006  . COLONOSCOPY WITH PROPOFOL N/A 01/15/2016   Procedure: COLONOSCOPY WITH PROPOFOL;  Surgeon: Gatha Mayer, MD;  Location: [facility] ENDOSCOPY;  Service: Endoscopy;  Laterality: N/A;  . ESOPHAGOGASTRODUODENOSCOPY (EGD) WITH PROPOFOL N/A 01/15/2016   Procedure: ESOPHAGOGASTRODUODENOSCOPY (EGD) WITH PROPOFOL;  Surgeon: Gatha Mayer, MD;  Location: [facility] ENDOSCOPY;  Service: Endoscopy;  Laterality: N/A;  . s/p left arm fracture with fall off chair    . skin graft to middle R finger  1975  . TEE WITHOUT CARDIOVERSION  N/A 12/11/2014   Procedure: TRANSESOPHAGEAL ECHOCARDIOGRAM (TEE);  Surgeon: Josue Hector, MD;  Location: [facility] ENDOSCOPY;  Service: Cardiovascular;  Laterality: N/A;  . TONSILLECTOMY      Social History   Socioeconomic History  . Marital status: Married    Spouse name: Not on file  . Number of children: 3  . Years of education: Not on file  . Highest education level: Not on file  Social Needs  . Financial resource strain: Not on file  . Food insecurity - worry: Not on file  . Food insecurity - inability: Not on file  . Transportation needs - medical: Not on file  . Transportation needs - non-medical: Not on file  Occupational History  . 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 Smoking status: Former Smoker    Packs/day: 1.00    Years: 35.00    Pack years: 35.00    Types: Cigarettes    Last attempt to quit: 01/12/1996    Years since quitting: 21.0  . Smokeless tobacco: Never Used  Substance and Sexual Activity  . Alcohol use: No    Alcohol/week: 0.0 oz  . Drug use: No  . Sexual activity: Yes  Other Topics Concern  . Not on file  Social History  Narrative   Married 1 son 2 daughters   Disabled   2 caffeine/day   Past smoker   11/12/2015    Family History  Problem Relation Age of Onset  . Heart disease Mother   . Liver disease Mother        alcohol related  . Asthma Brother   . Esophageal cancer Brother   . Asthma Son   . Colon cancer Neg Hx   . Stomach cancer Neg Hx   . Pancreatic cancer Neg Hx   . Inflammatory bowel disease Neg Hx     ROS: no fevers or chills, productive cough, hemoptysis, dysphasia, odynophagia, melena, hematochezia, dysuria, hematuria, rash, seizure activity, orthopnea, PND, pedal edema, claudication. Remaining systems are negative.  Physical Exam: Well-developed obese in no acute distress.  Skin is warm and dry.  HEENT is normal.  Neck is supple.  Chest is clear to auscultation with normal expansion.  Cardiovascular exam is regular rate and rhythm.  Abdominal exam nontender or distended. No masses palpated. Extremities show no edema. neuro grossly intact   A/P  1 paroxysmal atrial fibrillation-patient is in sinus rhythm today on exam.  We will continue with metoprolol for rate control if atrial fibrillation recurs.  Continue apixaban.  Continue propafenone.  2 moderate aortic stenosis-patient will need follow-up echocardiogram October 2019.  3 chronic diastolic congestive heart failure-she appears to be euvolemic this morning.  Continue present dose of Lasix.  We discussed low-sodium diet and fluid restriction.  4 Hypertension-blood pressure is elevated; however she brought records from home and her blood pressure is typically controlled.  Continue present medications and follow.  5 hyperlipidemia-continue statin.  6 obesity-we discussed the importance of weight loss.  Kirk Ruths, MD

## 2017-01-26 ENCOUNTER — Ambulatory Visit (INDEPENDENT_AMBULATORY_CARE_PROVIDER_SITE_OTHER): Payer: BLUE CROSS/BLUE SHIELD | Admitting: Internal Medicine

## 2017-01-26 ENCOUNTER — Encounter: Payer: Self-pay | Admitting: Internal Medicine

## 2017-01-26 VITALS — BP 116/70 | HR 78 | Ht 62.0 in | Wt 259.0 lb

## 2017-01-26 DIAGNOSIS — J449 Chronic obstructive pulmonary disease, unspecified: Secondary | ICD-10-CM | POA: Diagnosis not present

## 2017-01-26 DIAGNOSIS — J9611 Chronic respiratory failure with hypoxia: Secondary | ICD-10-CM | POA: Diagnosis not present

## 2017-01-26 DIAGNOSIS — J9612 Chronic respiratory failure with hypercapnia: Secondary | ICD-10-CM | POA: Diagnosis not present

## 2017-01-26 DIAGNOSIS — R058 Other specified cough: Secondary | ICD-10-CM

## 2017-01-26 DIAGNOSIS — R05 Cough: Secondary | ICD-10-CM

## 2017-01-26 NOTE — Patient Instructions (Addendum)
Change spiriva to the 1.25 x one pff each am   Work on inhaler technique:  relax and gently blow all the way out then take a nice smooth deep breath back in, triggering the inhaler at same time you start breathing in.  Hold for up to 5 seconds if you can. Blow out thru nose. Rinse and gargle with water when done     Each maintenance medication was reviewed in detail including most importantly the difference between maintenance and as needed and under what circumstances the prns are to be used. This was done in the context of a medication calendar review which provided the patient with a user-friendly unambiguous mechanism for medication administration and reconciliation and provides an action plan for all active problems. It is critical that this be shown to every doctor  for modification during the office visit if necessary so the patient can use it as a working document.        Please schedule a follow up visit in 3 months but call sooner if needed

## 2017-01-26 NOTE — Progress Notes (Signed)
Subjective:    Patient ID: [name] [name], female    DOB: [birth year].   MRN: [id number]    Brief patient profile:  86 yowf last smoked 2003 with Morbid obesity/ COPD GOLD II  with minimum asthmatic component documented 07/25/06. On 02 24 hours per day at 3lpm       History of Present Illness  11/26/2014  f/u ov/Wert re: copd/ severe obesity /uacs  - has med calendar, not really using the action plans at the bottom Chief Complaint  Patient presents with  . Follow-up    Pt c/o increased cough for the past few months non prod.  She states "feels like I am downding in fluid".     On 2-2.5  24/7 able to do [employer] with 02 in a basket  sleep ok/ sensation of pnds day > noct   rec For drainage / throat tickle try take CHLORPHENIRAMINE  4 mg - take one every 4 hours as needed See calendar for specific medication instructions     12/24/15 Cough eval NP /[place] rec Please continue to follow the medication calender [name] developed with you 12/12/2015. Please see Dr. Jenny Reichmann about your worsening depression. Please use Mucinex twice daily as prescribed. Please use the chlorpheniramine up to every 4 hours as needed for cough. Don't drive if sleepy Delsym for cough during the day. Try sugar free [name] to soothe your throat  Gabapentin 100 mg three times daily with meals>  Did not start       05/04/2016  f/u ov/Wert re: GOLD II /  ohs  maint on symb 160 2bid  Chief Complaint  Patient presents with  . Follow-up    Pt states her breathing is good "as long as I stay inside". She is coughing less.    tremors started x one year prior to OV  Worse x 3 months but no different if misses dose of symbicort/ no need for saba at all  Cough better if sips water / worse with "allergy season" though has no obvious nasal allergy symptoms  Rec Prednisone 10 mg take  4 each am x 2 days,   2 each am x 2 days,  1 each am x 2 days and stop  Remember for tickle/ allergy/ drainage to take  chlorpheniramine up to every 4 hours during the day  When walking turn the 02 up to 4lpm on your portable concentrator  See [name] NP in 3 years  with all your medication    10/27/2016  f/u ov/Wert re:  GOLD II   02 dep on 2.5 home/ 4lpm POC Chief Complaint  Patient presents with  . Follow-up    Breathing is unchanged. She is using her albuterol inhaler 2 x per wk on average and she rarely uses neb.   doe = MMRC3 = can't walk 100 yards even at a slow [name] at a flat grade s stopping due to sob  Even on 4lpm  Trouble with eyes watering / rhinitis     Better on eyedrops, 1st gen H1 blockers per guidelines  rec No change rx  4lpm POC walking /   2.5 lpm o/w      01/26/2017  f/u ov/Wert re:  Has med calendar / not using consistently esp action plan  Chief Complaint  Patient presents with  . Follow-up    Breathing is about the same. She rarely uses her proair or xopenex neb. Her cough is much improved on Spiriva Respimat, but it  causes her hands to shake. She cut back to using it 3 x per wk and this has not helped the shaking.    sleeps on 2.5 lpm and 4 pillows  Doe still MMRC3 = can't walk 100 yards even at a slow [name] at a flat grade s stopping due to sob even on up to 5lpm walking   No obvious day to day or daytime variability or assoc excess/ purulent sputum or mucus plugs or hemoptysis or cp or chest tightness, subjective wheeze or overt sinus or hb symptoms. No unusual exposure hx or h/o childhood pna/ asthma or knowledge of premature birth.  Sleeping ok on 2.5lpm and 4 pillows/  without nocturnal  or early am exacerbation  of respiratory  c/o's or need for noct saba. Also denies any obvious fluctuation of symptoms with weather or environmental changes or other aggravating or alleviating factors except as outlined above   Current Allergies, Complete Past Medical History, Past Surgical History, Family History, and Social History were reviewed in [employer]  record.  ROS  The following are not active complaints unless bolded Hoarseness, sore throat, dysphagia, dental problems, itching, sneezing,  nasal congestion or discharge of excess mucus or purulent secretions, ear ache,   fever, chills, sweats, unintended wt loss or wt gain, classically pleuritic or exertional cp,  orthopnea pnd or leg swelling, presyncope, palpitations, abdominal pain, anorexia, nausea, vomiting, diarrhea  or change in bowel habits or change in bladder habits, change in stools or change in urine, dysuria, hematuria,  rash, arthralgias, visual complaints, headache, numbness, weakness or ataxia or problems with walking or coordination,  change in mood/affect or memory.        Current Meds  Medication Sig  . acetaminophen (TYLENOL) 650 MG CR tablet Take 650-1,300 mg by mouth as needed for pain. Per bottle as needed  . albuterol (PROAIR HFA) 108 (90 Base) MCG/ACT inhaler Inhale 2 puffs into the lungs every 6 (six) hours as needed for wheezing or shortness of breath.  Marland Kitchen apixaban (ELIQUIS) 5 MG TABS tablet Take 1 tablet (5 mg total) by mouth 2 (two) times daily.  Marland Kitchen atorvastatin (LIPITOR) 10 MG tablet Take 1 tablet (10 mg total) by mouth daily.  Marland Kitchen azelastine (ASTELIN) 0.1 % nasal spray Place 2 sprays at bedtime as needed into both nostrils for rhinitis. Use in each nostril as directed  . budesonide-formoterol (SYMBICORT) 160-4.5 MCG/ACT inhaler Inhale 2 puffs 2 (two) times daily into the lungs.  . Calcium Carbonate-Vitamin D 600-400 MG-UNIT per tablet Take 1 tablet by mouth 2 (two) times daily.    . cetirizine (ZYRTEC) 10 MG tablet Take 10 mg daily as needed by mouth for allergies.  Marland Kitchen dextromethorphan (DELSYM) 30 MG/5ML liquid Take 2 tsp twice daily as needed  . Dextromethorphan-Guaifenesin (MUCINEX DM MAXIMUM STRENGTH) 60-1200 MG TB12 Take 1 tablet by mouth every 12 (twelve) hours as needed (cough/congestion). With flutter  . ferrous sulfate 325 (65 FE) MG tablet Take 325 mg by mouth 2  (two) times daily with a meal.   . FLUoxetine (PROZAC) 40 MG capsule TAKE 1 CAPSULE DAILY (Patient taking differently: TAKE 40 mg CAPSULE DAILY)  . fluticasone (FLONASE) 50 MCG/ACT nasal spray Place 2 sprays into both nostrils 2 (two) times daily as needed for allergies or rhinitis.  . furosemide (LASIX) 80 MG tablet TAKE ONE AND ONE-HALF TABLETS IN THE MORNING AND 1 TABLET IN THE AFTERNOON  . glucosamine-chondroitin 500-400 MG tablet Take 1 tablet by mouth every morning.   Marland Kitchen  KLOR-CON M20 20 MEQ tablet TAKE 1 TABLET TWICE A DAY. PLEASE SCHEDULE APPOINTMENT FOR REFILLS  . levalbuterol (XOPENEX) 1.25 MG/3ML nebulizer solution Take 1.25 mg by nebulization every 4 (four) hours.  Marland Kitchen levothyroxine (SYNTHROID, LEVOTHROID) 25 MCG tablet TAKE 1 TABLET DAILY BEFORE BREAKFAST  . metoprolol tartrate (LOPRESSOR) 25 MG tablet Take 1 tablet (25 mg total) by mouth 2 (two) times daily.  . Olopatadine HCl (PATADAY) 0.2 % SOLN Place 1 drop into both eyes daily.  Marland Kitchen omeprazole (PRILOSEC) 20 MG capsule Take 20mg s before supper daily  . OXYGEN Inhale 2.5 L/min into the lungs continuous.  . propafenone (RYTHMOL SR) 325 MG 12 hr capsule Take 1 capsule (325 mg total) by mouth 2 (two) times daily.  . sodium chloride (OCEAN) 0.65 % SOLN nasal spray Place 2 sprays every 4 (four) hours as needed into both nostrils for congestion.  . Tiotropium Bromide Monohydrate (SPIRIVA RESPIMAT) 1.25 MCG/ACT AERS Inhale 2 puffs daily into the lungs. (Patient taking differently: Inhale 2 puffs into the lungs 3 (three) times a week. Monday, Wednesday and Friday)                         Past Medical History:  CHRONIC RHINITIS (ICD-472.0)  EDEMA (ICD-782.3)  ACUTE AND CHRONIC RESPIRATORY FAILURE (ICD-518.84)  MORBID OBESITY (ICD-278.01)  - Target wt = 179 for BMI < 30 peak wt 282  - Referred back to nutrition December 18, 2007  DEEP VENOUS THROMBOPHLEBITIS, HX OF (ICD-V12.52)  COPD  (ICD-496)..........................................................Marland KitchenWert  - PFTs 07/25/06 FEV1 55% ratio 53, DLC0 51%  - HFA 50% December 16, 2009 > 75% March 27, 2010  DEPRESSION (ICD-311)  OSTEOPOROSIS (ICD-733.00) last BMD 4/08 -2.4, intolerant of bisphosphonates  HYPERTENSION (ICD-401.9)  Atrial fibrillation - paroxysmal  Anticoagulation therapy  Pulmonary embolism, hx of  neg stress myoview 10/07  Anxiety  glucose intolerance - on steroids  Hyperlipidemia  Complex medical regimen  - Med calendar done 05/01/2008, 08/26/2010 , 01/17/12 , 06/26/2013 , 12/12/2015          Objective:   Physical Exam   Obese amb somber wf nad  Vital signs reviewed - Note on arrival 02 sats  96% on 3lpm pulsed     Wt  274 09/01/10 >   09/29/2010  275 > 01/01/2011  280 > 04/29/2011  279 > 285 11/17/2011  > 288 02/18/2012 > 281 04/19/2012 > 03/13/2013  282 >  09/11/2013  282 >  11/26/2014  259 > 11/27/2015  273 > 02/02/2016   272 > 05/04/2016   261>  10/27/2016  260> 01/26/2017   259    HEENT: nl dentition, turbinates bilaterally, and oropharynx. Nl external ear canals without cough reflex   NECK :  without JVD/Nodes/TM/ nl carotid upstrokes bilaterally   LUNGS: no acc muscle use,  Nl contour chest which is clear to A and P bilaterally without cough on insp or exp maneuvers   CV:  RRR  no s3 or murmur or increase in P2, and no edema   ABD:  qjuite obese but soft and nontender with very limited  inspiratory excursion in the supine position. No bruits or organomegaly appreciated, bowel sounds nl  MS:  Nl gait/ ext warm without deformities, calf tenderness, cyanosis or clubbing No obvious joint restrictions   SKIN: warm and dry without lesions    NEURO:  alert, approp, nl sensorium with  no motor or cerebellar deficits apparent.

## 2017-01-27 ENCOUNTER — Encounter: Payer: Self-pay | Admitting: Internal Medicine

## 2017-01-27 NOTE — Assessment & Plan Note (Signed)
rec H1 and  neurontin 100 tid 12/24/15 >>> never took gabapentin >> better with 1st gen H1 prn - given ok to resume fosfamax 02/02/2016  > d/c'd 05/04/2016  - Allergy profile 05/04/2016 >  Eos 0.1 /  IgE  2 with neg RAST     Adequate control on present rx, reviewed in detail with pt > no change in rx needed

## 2017-01-27 NOTE — Assessment & Plan Note (Signed)
Followed in Pulmonary clinic/ [facility]/ Verneice Caspers      - 09/29/10  Walked 3lpm  2 laps @ 185 ft each stopped due to  Sob with sats 90%     - HC03 11/08/11 = 34     - 11-17-11--o2 sat on ra at rest 83%ra     - 02/18/2012  Sat 88% RA across the room > Walked 3lpm x 3 laps @ 185 ft each stopped due to  End of study no desat     - 04/19/2012  Walked 3lpm x 3 laps @ 185 ft each stopped due to  End of study, sats dropped to 88 at very end  - HC03  34    08/01/15  - 05/04/2016 Patient Saturations on Room Air at Rest = 86% Patient Saturations on 4 Liters of oxygen while Ambulating = 90% Please briefly explain why patient needs home oxygen: Pt oxygen drops while walking. - 10/27/2016   Walked 4lpm POC  x one lap @ 185 stopped due to  Sob with desat 87% slow pace    As of 01/26/2017 rec 2lpm at rest and up to  5lpm walking

## 2017-01-27 NOTE — Assessment & Plan Note (Signed)
Body mass index is 47.37 kg/m.  - no change  Lab Results  Component Value Date   TSH 4.59 (H) 11/08/2016     Contributing to gerd risk/ doe/reviewed the need and the process to achieve and maintain neg calorie balance > defer f/u primary care including intermittently monitoring thyroid status

## 2017-01-27 NOTE — Assessment & Plan Note (Signed)
-   PFTs 07/25/06 FEV1 55% ratio 53, DLC0 51%  -  PFT's 04/19/2012 FEV1 50% (ratio 51) with 19% improvement p B2, dlco 42 > 99% corrected  - med calendar 01/17/2012  -Tudorza trial  01/17/12 > permanently d/c 11/27/2015 due to uacs   - 01/26/2017  After extensive coaching HFA effectiveness =    75% (short Ti) change to spiriva 1.25 one puff each am (higher doses not tol)   I doubt the tremor has anything to do with the spiriva but not sure she really benefits much anyway from use of laba so rec she start with the lowest dose and then possibly increase gradually if helps doe and doesn't aggravate the tremor issue  I had an extended discussion with the patient reviewing all relevant studies completed to date and  lasting 15 to 20 minutes of a 25 minute visit    Each maintenance medication was reviewed in detail including most importantly the difference between maintenance and prns and under what circumstances the prns are to be triggered using an action plan format that is not reflected in the computer generated alphabetically organized AVS but trather by a customized med calendar that reflects the AVS meds with confirmed 100% correlation.   In addition, Please see AVS for unique instructions that I personally wrote and verbalized to the the pt in detail and then reviewed with pt  by my nurse highlighting any  changes in therapy recommended at today's visit to their plan of care.

## 2017-02-01 ENCOUNTER — Ambulatory Visit (INDEPENDENT_AMBULATORY_CARE_PROVIDER_SITE_OTHER): Payer: BLUE CROSS/BLUE SHIELD | Admitting: Cardiology

## 2017-02-01 ENCOUNTER — Encounter: Payer: Self-pay | Admitting: Cardiology

## 2017-02-01 VITALS — BP 164/89 | HR 84 | Ht 62.0 in | Wt 261.8 lb

## 2017-02-01 DIAGNOSIS — I10 Essential (primary) hypertension: Secondary | ICD-10-CM

## 2017-02-01 DIAGNOSIS — I5032 Chronic diastolic (congestive) heart failure: Secondary | ICD-10-CM

## 2017-02-01 DIAGNOSIS — E7800 Pure hypercholesterolemia, unspecified: Secondary | ICD-10-CM | POA: Diagnosis not present

## 2017-02-01 DIAGNOSIS — I480 Paroxysmal atrial fibrillation: Secondary | ICD-10-CM

## 2017-02-01 NOTE — Patient Instructions (Signed)
Your physician wants you to follow-up in: 6 MONTHS WITH DR CRENSHAW You will receive a reminder letter in the mail two months in advance. If you don't receive a letter, please call our office to schedule the follow-up appointment.   If you need a refill on your cardiac medications before your next appointment, please call your pharmacy.  

## 2017-02-07 ENCOUNTER — Encounter: Payer: Self-pay | Admitting: Cardiology

## 2017-02-15 ENCOUNTER — Other Ambulatory Visit: Payer: Self-pay | Admitting: Cardiology

## 2017-02-15 ENCOUNTER — Other Ambulatory Visit: Payer: Self-pay | Admitting: Internal Medicine

## 2017-02-17 ENCOUNTER — Telehealth: Payer: Self-pay | Admitting: Cardiology

## 2017-02-17 MED ORDER — APIXABAN 5 MG PO TABS: 5.0000 mg | ORAL_TABLET | Freq: Two times a day (BID) | ORAL | 1 refills | Status: DC

## 2017-02-17 NOTE — Telephone Encounter (Signed)
New message     *STAT* If patient is at the pharmacy, call can be transferred to refill team.   1. Which medications need to be refilled? (please list name of each medication and dose if known) apixaban (ELIQUIS) 5 MG TABS tablet and atorvastatin (LIPITOR) 10 MG tablet  2. Which pharmacy/location (including street and city if local pharmacy) is medication to be sent to?[facility] [place], [place]  3. Do they need a 30 day or 90 day supply? [place]

## 2017-02-18 ENCOUNTER — Other Ambulatory Visit: Payer: Self-pay | Admitting: Cardiology

## 2017-02-18 MED ORDER — KLOR-CON M20 20 MEQ PO TBCR: 20.0000 meq | EXTENDED_RELEASE_TABLET | Freq: Once | ORAL | 2 refills | Status: DC

## 2017-02-18 NOTE — Telephone Encounter (Signed)
°*  STAT* If patient is at the pharmacy, call can be transferred to refill team.   1. Which medications need to be refilled? (please list name of each medication and dose if known)Klor-Con M20   2. Which pharmacy/location (including street and city if local pharmacy) is medication to be sent to?[facility]  3. Do they need a 30 day or 90 day supply? [place]

## 2017-03-08 NOTE — Telephone Encounter (Signed)
Sueanne Margarita, MD  to Lelon Perla, MD    Discussed results of sleep study with patient.  Recommended proceeding with CPAP titration to help with afib suppresion and CHF - patient is not interested in proceeding at this time.

## 2017-03-10 ENCOUNTER — Ambulatory Visit (INDEPENDENT_AMBULATORY_CARE_PROVIDER_SITE_OTHER): Payer: BLUE CROSS/BLUE SHIELD | Admitting: Internal Medicine

## 2017-03-10 ENCOUNTER — Encounter: Payer: Self-pay | Admitting: Internal Medicine

## 2017-03-10 VITALS — BP 142/82 | HR 91 | Temp 99.5°F | Ht 62.0 in | Wt 260.0 lb

## 2017-03-10 DIAGNOSIS — J9612 Chronic respiratory failure with hypercapnia: Secondary | ICD-10-CM | POA: Diagnosis not present

## 2017-03-10 DIAGNOSIS — J9611 Chronic respiratory failure with hypoxia: Secondary | ICD-10-CM | POA: Diagnosis not present

## 2017-03-10 DIAGNOSIS — J449 Chronic obstructive pulmonary disease, unspecified: Secondary | ICD-10-CM | POA: Diagnosis not present

## 2017-03-10 DIAGNOSIS — R058 Other specified cough: Secondary | ICD-10-CM

## 2017-03-10 DIAGNOSIS — R05 Cough: Secondary | ICD-10-CM

## 2017-03-10 MED ORDER — PANTOPRAZOLE SODIUM 40 MG PO TBEC: 40.0000 mg | DELAYED_RELEASE_TABLET | Freq: Every day | ORAL | 2 refills | Status: DC

## 2017-03-10 MED ORDER — METHYLPREDNISOLONE ACETATE 80 MG/ML IJ SUSP
120.0000 mg | Freq: Once | INTRAMUSCULAR | Status: AC
  Administered 2017-03-10: 120 mg via INTRAMUSCULAR

## 2017-03-10 MED ORDER — FAMOTIDINE 20 MG PO TABS: ORAL_TABLET | ORAL | 11 refills | Status: DC

## 2017-03-10 NOTE — Progress Notes (Signed)
Subjective:    Patient ID: [name] [name], female    DOB: [birth year].   MRN: [id number]    Brief patient profile:  11 yowf last smoked 2003 with Morbid obesity/ COPD GOLD II  with minimum asthmatic component documented 07/25/06. On 02 24 hours per day at 3lpm       History of Present Illness  11/26/2014  f/u ov/Wert re: copd/ severe obesity /uacs  - has med calendar, not really using the action plans at the bottom Chief Complaint  Patient presents with  . Follow-up    Pt c/o increased cough for the past few months non prod.  She states "feels like I am downding in fluid".     On 2-2.5  24/7 able to do [employer] with 02 in a basket  sleep ok/ sensation of pnds day > noct   rec For drainage / throat tickle try take CHLORPHENIRAMINE  4 mg - take one every 4 hours as needed See calendar for specific medication instructions     12/24/15 Cough eval NP /[facility] rec Please continue to follow the medication calender [name] developed with you 12/12/2015. Please see Dr. Jenny Reichmann about your worsening depression. Please use Mucinex twice daily as prescribed. Please use the chlorpheniramine up to every 4 hours as needed for cough. Don't drive if sleepy Delsym for cough during the day. Try sugar free [name] to soothe your throat  Gabapentin 100 mg three times daily with meals>  Did not start       05/04/2016  f/u ov/Wert re: GOLD II /  ohs  maint on symb 160 2bid  Chief Complaint  Patient presents with  . Follow-up    Pt states her breathing is good "as long as I stay inside". She is coughing less.    tremors started x one year prior to OV  Worse x 3 months but no different if misses dose of symbicort/ no need for saba at all  Cough better if sips water / worse with "allergy season" though has no obvious nasal allergy symptoms  Rec Prednisone 10 mg take  4 each am x 2 days,   2 each am x 2 days,  1 each am x 2 days and stop  Remember for tickle/ allergy/ drainage to take  chlorpheniramine up to every 4 hours during the day  When walking turn the 02 up to 4lpm on your portable concentrator  See [name] NP in 3 years  with all your medication    10/27/2016  f/u ov/Wert re:  GOLD II   02 dep on 2.5 home/ 4lpm POC Chief Complaint  Patient presents with  . Follow-up    Breathing is unchanged. She is using her albuterol inhaler 2 x per wk on average and she rarely uses neb.   doe = MMRC3 = can't walk 100 yards even at a slow [name] at a flat grade s stopping due to sob  Even on 4lpm  Trouble with eyes watering / rhinitis     Better on eyedrops, 1st gen H1 blockers per guidelines  rec No change rx  4lpm POC walking /   2.5 lpm o/w      01/26/2017  f/u ov/Wert re:  Has med calendar / not using consistently esp action plan  Chief Complaint  Patient presents with  . Follow-up    Breathing is about the same. She rarely uses her proair or xopenex neb. Her cough is much improved on Spiriva Respimat, but it  causes her hands to shake. She cut back to using it 3 x per wk and this has not helped the shaking.    sleeps on 2.5 lpm and 4 pillows  Doe still MMRC3 = can't walk 100 yards even at a slow [name] at a flat grade s stopping due to sob even on up to 5lpm walking  Sleeping ok on 2.5lpm and 4 pillows/  without nocturnal  or early am exacerbation  of respiratory  c/o's or need for noct saba. Also denies any obvious fluctuation of symptoms with weather or environmental changes or other aggravating or alleviating factors except as outlined above  rec Change spiriva to the 1.25 x one pff each am  Work on inhaler technique     03/10/2017  Acute ext ov/Wert re: GOLD II copd/ 02 dep/ acute cough  - no med cal  Chief Complaint  Patient presents with  . Acute Visit    cough for 3 days- non prod. She states her breathing is unchanged. She has been using her albuterol inhaler 1 x per wk on average.   Dyspnea:  Using 4lpm walking pulsed = . MMRC3 = can't walk 100 yards even at  a slow [name] at a flat grade s stopping due to sob   Cough: acutely x 3 days / 24/7 feels like started with a tickle in throat Sleep: 4 pillws/ 2.5 lpm  SABA use:  As above  No obvious day to day or daytime variability or assoc excess/ purulent sputum or mucus plugs or hemoptysis or cp or chest tightness, subjective wheeze or overt sinus or hb symptoms. No unusual exposure hx or h/o childhood pna/ asthma or knowledge of premature birth.  Sleeping ok flat without nocturnal  or early am exacerbation  of respiratory  c/o's or need for noct saba. Also denies any obvious fluctuation of symptoms with weather or environmental changes or other aggravating or alleviating factors except as outlined above   Current Allergies, Complete Past Medical History, Past Surgical History, Family History, and Social History were reviewed in [employer] record.  ROS  The following are not active complaints unless bolded Hoarseness, sore throat, dysphagia, dental problems, itching, sneezing,  nasal congestion or discharge of excess mucus or purulent secretions, ear ache,   fever, chills, sweats, unintended wt loss or wt gain, classically pleuritic or exertional cp,  orthopnea pnd or leg swelling, presyncope, palpitations, abdominal pain, anorexia, nausea, vomiting, diarrhea  or change in bowel habits or change in bladder habits, change in stools or change in urine, dysuria, hematuria,  rash, arthralgias, visual complaints, headache, numbness, weakness or ataxia or problems with walking or coordination,  change in mood/affect or memory.        Current Meds  Medication Sig  . acetaminophen (TYLENOL) 650 MG CR tablet Take 650-1,300 mg by mouth as needed for pain. Per bottle as needed  . albuterol (PROAIR HFA) 108 (90 Base) MCG/ACT inhaler Inhale 2 puffs into the lungs every 6 (six) hours as needed for wheezing or shortness of breath.  Marland Kitchen apixaban (ELIQUIS) 5 MG TABS tablet Take 1 tablet (5 mg total) by  mouth 2 (two) times daily.  Marland Kitchen atorvastatin (LIPITOR) 10 MG tablet Take 1 tablet (10 mg total) by mouth daily.  Marland Kitchen azelastine (ASTELIN) 0.1 % nasal spray Place 2 sprays at bedtime as needed into both nostrils for rhinitis. Use in each nostril as directed  . budesonide-formoterol (SYMBICORT) 160-4.5 MCG/ACT inhaler Inhale 2 puffs 2 (two) times  daily into the lungs.  . Calcium Carbonate-Vitamin D 600-400 MG-UNIT per tablet Take 1 tablet by mouth 2 (two) times daily.    . cetirizine (ZYRTEC) 10 MG tablet Take 10 mg daily as needed by mouth for allergies.  Marland Kitchen dextromethorphan (DELSYM) 30 MG/5ML liquid Take 2 tsp twice daily as needed  . Dextromethorphan-Guaifenesin (MUCINEX DM MAXIMUM STRENGTH) 60-1200 MG TB12 Take 1 tablet by mouth every 12 (twelve) hours as needed (cough/congestion). With flutter  . ferrous sulfate 325 (65 FE) MG tablet Take 325 mg by mouth 2 (two) times daily with a meal.   . FLUoxetine (PROZAC) 40 MG capsule TAKE 1 CAPSULE DAILY (Patient taking differently: TAKE 40 mg CAPSULE DAILY)  . fluticasone (FLONASE) 50 MCG/ACT nasal spray Place 2 sprays into both nostrils 2 (two) times daily as needed for allergies or rhinitis.  . furosemide (LASIX) 80 MG tablet TAKE ONE AND ONE-HALF TABLETS IN THE MORNING AND 1 TABLET IN THE AFTERNOON  . glucosamine-chondroitin 500-400 MG tablet Take 1 tablet by mouth every morning.   Marland Kitchen KLOR-CON M20 20 MEQ tablet Take 1 tablet (20 mEq total) by mouth once for 1 dose. (Patient taking differently: Take 20 mEq by mouth daily. )  . levalbuterol (XOPENEX) 1.25 MG/3ML nebulizer solution Take 1.25 mg by nebulization every 4 (four) hours.  Marland Kitchen levothyroxine (SYNTHROID, LEVOTHROID) 25 MCG tablet TAKE 1 TABLET DAILY BEFORE BREAKFAST  . metoprolol tartrate (LOPRESSOR) 25 MG tablet Take 1 tablet (25 mg total) by mouth 2 (two) times daily.  . Olopatadine HCl (PATADAY) 0.2 % SOLN Place 1 drop into both eyes daily.  . OXYGEN Inhale 2.5 L/min into the lungs continuous. And 4  with exertion  . propafenone (RYTHMOL SR) 325 MG 12 hr capsule Take 1 capsule (325 mg total) by mouth 2 (two) times daily.  . sodium chloride (OCEAN) 0.65 % SOLN nasal spray Place 2 sprays every 4 (four) hours as needed into both nostrils for congestion.  . Tiotropium Bromide Monohydrate (SPIRIVA RESPIMAT) 1.25 MCG/ACT AERS Inhale 2 puffs daily into the lungs. (Patient taking differently: Inhale 2 puffs into the lungs 3 (three) times a week. Monday, Wednesday and Friday)  . [DISCONTINUED] omeprazole (PRILOSEC) 20 MG capsule Take 20mg s before supper daily           Past Medical History:  CHRONIC RHINITIS (ICD-472.0)  EDEMA (ICD-782.3)  ACUTE AND CHRONIC RESPIRATORY FAILURE (ICD-518.84)  MORBID OBESITY (ICD-278.01)  - Target wt = 179 for BMI < 30 peak wt 282  - Referred back to nutrition December 18, 2007  DEEP VENOUS THROMBOPHLEBITIS, HX OF (ICD-V12.52)  COPD (ICD-496)..........................................................Marland KitchenWert  - PFTs 07/25/06 FEV1 55% ratio 53, DLC0 51%  - HFA 50% December 16, 2009 > 75% March 27, 2010  DEPRESSION (ICD-311)  OSTEOPOROSIS (ICD-733.00) last BMD 4/08 -2.4, intolerant of bisphosphonates  HYPERTENSION (ICD-401.9)  Atrial fibrillation - paroxysmal  Anticoagulation therapy  Pulmonary embolism, hx of  neg stress myoview 10/07  Anxiety  glucose intolerance - on steroids  Hyperlipidemia  Complex medical regimen            Objective:   Physical Exam      Wt  274 09/01/10 >   09/29/2010  275 > 01/01/2011  280 > 04/29/2011  279 > 285 11/17/2011  > 288 02/18/2012 > 281 04/19/2012 > 03/13/2013  282 >  09/11/2013  282 >  11/26/2014  259 > 11/27/2015  273 > 02/02/2016   272 > 05/04/2016   261>  10/27/2016  260> 01/26/2017  259 > 03/10/2017   260   amb hoarse wf nad  Vital signs reviewed - Note on arrival 02 sats  92% on 4lpm pulsed      HEENT: nl dentition, turbinates bilaterally, and oropharynx. Nl external ear canals without cough reflex   NECK :  without  JVD/Nodes/TM/ nl carotid upstrokes bilaterally   LUNGS: no acc muscle use,  Nl contour chest with distant bs/ minimal rhonchi on insp/exp   CV:  RRR  no s3 or murmur or increase in P2, and no edema   ABD:  Quite obese but  soft and nontender with limited inspiratory excursion in the supine position. No bruits or organomegaly appreciated, bowel sounds nl  MS:  Nl gait/ ext warm without deformities, calf tenderness, cyanosis or clubbing No obvious joint restrictions   SKIN: warm and dry without lesions    NEURO:  alert, approp, nl sensorium with  no motor or cerebellar deficits apparent.

## 2017-03-10 NOTE — Patient Instructions (Signed)
Depomedrol 120 mg IM today    Work on inhaler technique:  relax and gently blow all the way out then take a nice smooth deep breath back in, triggering the inhaler at same time you start breathing in.  Hold for up to 5 seconds if you can. Blow symbicort out thru nose. Rinse and gargle with water when done      Stop omeprazole and start Pantoprazole (protonix) 40 mg   Take  30-60 min before first meal of the day and Pepcid (famotidine)  20 mg one @  bedtime     Keep your appt, bring all meds in two bags

## 2017-03-13 ENCOUNTER — Encounter: Payer: Self-pay | Admitting: Internal Medicine

## 2017-03-13 ENCOUNTER — Other Ambulatory Visit: Payer: Self-pay | Admitting: Cardiology

## 2017-03-13 NOTE — Assessment & Plan Note (Signed)
-   09/29/10  Walked 3lpm  2 laps @ 185 ft each stopped due to  Sob with sats 90%     - HC03 11/08/11 = 34     - 11-17-11--o2 sat on ra at rest 83%ra     - 02/18/2012  Sat 88% RA across the room > Walked 3lpm x 3 laps @ 185 ft each stopped due to  End of study no desat     - 04/19/2012  Walked 3lpm x 3 laps @ 185 ft each stopped due to  End of study, sats dropped to 88 at very end  - HC03  34    08/01/15  - 05/04/2016 Patient Saturations on Room Air at Rest = 86% Patient Saturations on 4 Liters of oxygen while Ambulating = 90% Please briefly explain why patient needs home oxygen: Pt oxygen drops while walking. - 10/27/2016   Walked 4lpm POC  x one lap @ 185 stopped due to  Sob with desat 87% slow pace      As of 03/10/2017 rec 2lpm at rest and up to  4lpm pulsed walking(highest setting)

## 2017-03-13 NOTE — Assessment & Plan Note (Signed)
rec H1 and  neurontin 100 tid 12/24/15 >>> never took gabapentin >> better with 1st gen H1 prn - given ok to resume fosfamax 02/02/2016  > d/c'd 05/04/2016  - Allergy profile 05/04/2016 >  Eos 0.1 /  IgE  2 with neg RAST    - restart max gerd rx 03/10/2017    Acute flare in setting of ? [place] Of the three most common causes of  Sub-acute or recurrent or chronic cough, only one (GERD)  can actually contribute to/ trigger  the other two (asthma and post nasal drip syndrome)  and perpetuate the cylce of cough.  While not intuitively obvious, many patients with chronic low grade reflux do not cough until there is a primary insult that disturbs the protective epithelial barrier and exposes sensitive nerve endings.   This is typically viral as likely the case here but can be direct physical injury such as with an endotracheal tube.   The point is that once this occurs, it is difficult to eliminate the cycle  using anything but a maximally effective acid suppression regimen at least in the short run, accompanied by an appropriate diet to address non acid GERD and control / eliminate the cough itself for at least 3 days.

## 2017-03-13 NOTE — Assessment & Plan Note (Signed)
Body mass index is 47.55 kg/m.  -  trending up slightly  Lab Results  Component Value Date   TSH 4.59 (H) 11/08/2016     Contributing to gerd risk/ doe/reviewed the need and the process to achieve and maintain neg calorie balance > defer f/u primary care including intermittently monitoring thyroid status

## 2017-03-13 NOTE — Assessment & Plan Note (Addendum)
-   PFTs 07/25/06 FEV1 55% ratio 53, DLC0 51%  -  PFT's 04/19/2012 FEV1 50% (ratio 51) with 19% improvement p B2, dlco 42 > 99% corrected  - med calendar 01/17/2012  -Tudorza trial  01/17/12 > permanently d/c 11/27/2015 due to uacs   - 01/26/2017  After extensive coaching HFA effectiveness =    75% (short Ti) change to spiriva 1.25 one puff each am (higher doses not tol)   Minimal flare in setting of ? Viral uri/ no abx indicated rx Depomedrol 120 mg IM and follow the action plan as written/newly edited/ reviewed    I had an extended discussion with the patient reviewing all relevant studies completed to date and  lasting 15 to 20 minutes of a 25 minute visit    Each maintenance medication was reviewed in detail including most importantly the difference between maintenance and prns and under what circumstances the prns are to be triggered using an action plan format that is not reflected in the computer generated alphabetically organized AVS but trather by a customized med calendar that reflects the AVS meds with confirmed 100% correlation.   In addition, Please see AVS for unique instructions that I personally wrote and verbalized to the the pt in detail and then reviewed with pt  by my nurse highlighting any  changes in therapy recommended at today's visit to their plan of care.

## 2017-03-30 ENCOUNTER — Other Ambulatory Visit: Payer: Self-pay | Admitting: Internal Medicine

## 2017-03-30 MED ORDER — FAMOTIDINE 20 MG PO TABS: ORAL_TABLET | ORAL | 3 refills | Status: DC

## 2017-03-30 MED ORDER — PANTOPRAZOLE SODIUM 40 MG PO TBEC: 40.0000 mg | DELAYED_RELEASE_TABLET | Freq: Every day | ORAL | 3 refills | Status: DC

## 2017-04-26 ENCOUNTER — Encounter: Payer: Self-pay | Admitting: Internal Medicine

## 2017-04-26 ENCOUNTER — Ambulatory Visit (INDEPENDENT_AMBULATORY_CARE_PROVIDER_SITE_OTHER): Payer: BLUE CROSS/BLUE SHIELD | Admitting: Internal Medicine

## 2017-04-26 VITALS — BP 132/80 | HR 78 | Temp 98.0°F | Ht 62.0 in | Wt 258.0 lb

## 2017-04-26 DIAGNOSIS — J449 Chronic obstructive pulmonary disease, unspecified: Secondary | ICD-10-CM | POA: Diagnosis not present

## 2017-04-26 DIAGNOSIS — J9611 Chronic respiratory failure with hypoxia: Secondary | ICD-10-CM

## 2017-04-26 DIAGNOSIS — J9612 Chronic respiratory failure with hypercapnia: Secondary | ICD-10-CM

## 2017-04-26 DIAGNOSIS — J309 Allergic rhinitis, unspecified: Secondary | ICD-10-CM | POA: Diagnosis not present

## 2017-04-26 MED ORDER — PREDNISONE 10 MG PO TABS: ORAL_TABLET | ORAL | 0 refills | Status: DC

## 2017-04-26 NOTE — Progress Notes (Signed)
Subjective:    Patient ID: [name] [name] [name], female    DOB: 04/13/1950.   MRN: [id number]    Brief patient profile:  89 yowf last smoked 2003 with Morbid obesity/ COPD GOLD II  with minimum asthmatic component documented 07/25/06. On 02 24 hours per day at 3lpm       History of Present Illness  11/26/2014  f/u ov/[name] [name] [name] re: copd/ severe obesity /uacs  - has med calendar, not really using the action plans at the bottom Chief Complaint  Patient presents with  . Follow-up    Pt c/o increased cough for the past few months non prod.  She states "feels like I am downding in fluid".     On 2-2.5  24/7 able to do [employer] with 02 in a basket  sleep ok/ sensation of pnds day > noct   rec For drainage / throat tickle try take CHLORPHENIRAMINE  4 mg - take one every 4 hours as needed See calendar for specific medication instructions     12/24/15 Cough eval NP /[place] rec Please continue to follow the medication calender [name] [name] [name] developed with you 12/12/2015. Please see Dr. Jenny [name] [name] about your worsening depression. Please use Mucinex twice daily as prescribed. Please use the chlorpheniramine up to every 4 hours as needed for cough. Don't drive if sleepy Delsym for cough during the day. Try sugar free [name] to soothe your throat  Gabapentin 100 mg three times daily with meals>  Did not start        03/10/2017  Acute ext ov/[name] [name] [name] re: GOLD II copd/ 02 dep/ acute cough  - no med cal  Chief Complaint  Patient presents with  . Acute Visit    cough for 3 days- non prod. She states her breathing is unchanged. She has been using her albuterol inhaler 1 x per wk on average.   Dyspnea:  Using 4lpm walking pulsed = . MMRC3 = can't walk 100 yards even at a slow [name] [name] at a flat grade s stopping due to sob   Cough: acutely x 3 days / 24/7 feels like started with a tickle in throat Sleep: 4 pillws/ 2.5 lpm  SABA use:  As above rec Depomedrol 120 mg IM today  Work on inhaler technique:    Stop  omeprazole and start Pantoprazole (protonix) 40 mg   Take  30-60 min before first meal of the day and Pepcid (famotidine)  20 mg one @  bedtime   Keep your appt, bring all meds in two bags     04/26/2017  f/u ov/[name] [name] [name] re:  GOLD II but 02 dep resp failure / rhinitis/ /MO  - brought med calendar and some meds Chief Complaint  Patient presents with  . Follow-up    headaches, increased non-productive cough, increased SOB, some chest tightness. Reports inhalers are effective.   Dyspnea:  MMRC3 = can't walk 100 yards even at a slow [name] [name] at a flat grade s stopping due to sob  Even on 4lpm  Cough: dry daytime  Sleep: on 2.5 lpm and sev pillows  SABA use:  Maybe once a day neb > proair HA frontal x 1 week not worse in am attributes to outdoor exposures to pollen assoc with sneezing/ watery rhinorrhea  No obvious day to day or daytime variability or assoc excess/ purulent sputum or mucus plugs or hemoptysis or cp or chest tightness, subjective wheeze or overt sinus or hb symptoms. No unusual exposure hx or h/o childhood pna/ asthma or  knowledge of premature birth.  Sleeping  On 2.52m and sev pillows  without nocturnal  or early am exacerbation  of respiratory  c/o's or need for noct saba. Also denies any obvious fluctuation of symptoms with weather or environmental changes or other aggravating or alleviating factors except as outlined above   Current Allergies, Complete Past Medical History, Past Surgical History, Family History, and Social History were reviewed in [employer] record.  ROS  The following are not active complaints unless bolded Hoarseness, sore throat, dysphagia, dental problems, itching, sneezing,  nasal congestion or discharge of excess mucus or purulent secretions, ear ache,   fever, chills, sweats, unintended wt loss or wt gain, classically pleuritic or exertional cp,  orthopnea pnd or arm/hand swelling  or leg swelling, presyncope, palpitations, abdominal pain,  anorexia, nausea, vomiting, diarrhea  or change in bowel habits or change in bladder habits, change in stools or change in urine, dysuria, hematuria,  rash, arthralgias, visual complaints, headache, numbness, weakness or ataxia or problems with walking or coordination,  change in mood or  memory.        Current Meds  Medication Sig  . acetaminophen (TYLENOL) 650 MG CR tablet Take 650-1,300 mg by mouth as needed for pain. Per bottle as needed  . albuterol (PROAIR HFA) 108 (90 Base) MCG/ACT inhaler Inhale 2 puffs into the lungs every 6 (six) hours as needed for wheezing or shortness of breath.  Marland Kitchen apixaban (ELIQUIS) 5 MG TABS tablet Take 1 tablet (5 mg total) by mouth 2 (two) times daily.  Marland Kitchen atorvastatin (LIPITOR) 10 MG tablet Take 1 tablet (10 mg total) by mouth daily.  Marland Kitchen azelastine (ASTELIN) 0.1 % nasal spray Place 2 sprays at bedtime as needed into both nostrils for rhinitis. Use in each nostril as directed  . budesonide-formoterol (SYMBICORT) 160-4.5 MCG/ACT inhaler Inhale 2 puffs 2 (two) times daily into the lungs.  . Calcium Carbonate-Vitamin D 600-400 MG-UNIT per tablet Take 1 tablet by mouth 2 (two) times daily.    . cetirizine (ZYRTEC) 10 MG tablet Take 10 mg daily as needed by mouth for allergies.  Marland Kitchen dextromethorphan (DELSYM) 30 MG/5ML liquid Take 2 tsp twice daily as needed  . Dextromethorphan-Guaifenesin (MUCINEX DM MAXIMUM STRENGTH) 60-1200 MG TB12 Take 1 tablet by mouth every 12 (twelve) hours as needed (cough/congestion). With flutter  . famotidine (PEPCID) 20 MG tablet One at bedtime  . ferrous sulfate 325 (65 FE) MG tablet Take 325 mg by mouth 2 (two) times daily with a meal.   . FLUoxetine (PROZAC) 40 MG capsule TAKE 1 CAPSULE DAILY (Patient taking differently: TAKE 40 mg CAPSULE DAILY)  . fluticasone (FLONASE) 50 MCG/ACT nasal spray Place 2 sprays into both nostrils 2 (two) times daily as needed for allergies or rhinitis.  . furosemide (LASIX) 80 MG tablet TAKE ONE AND ONE-HALF  TABLETS IN THE MORNING AND 1 TABLET IN THE AFTERNOON  . glucosamine-chondroitin 500-400 MG tablet Take 1 tablet by mouth every morning.   . levalbuterol (XOPENEX) 1.25 MG/3ML nebulizer solution Take 1.25 mg by nebulization every 4 (four) hours.  Marland Kitchen levothyroxine (SYNTHROID, LEVOTHROID) 25 MCG tablet TAKE 1 TABLET DAILY BEFORE BREAKFAST  . metoprolol tartrate (LOPRESSOR) 25 MG tablet Take 1 tablet (25 mg total) by mouth 2 (two) times daily.  . Olopatadine HCl (PATADAY) 0.2 % SOLN Place 1 drop into both eyes daily.  . OXYGEN Inhale 2.5 L/min into the lungs continuous. And 4 with exertion  . pantoprazole (PROTONIX) 40 MG tablet Take  1 tablet (40 mg total) by mouth daily. Take 30-60 min before first meal of the day  . propafenone (RYTHMOL SR) 325 MG 12 hr capsule Take 1 capsule (325 mg total) by mouth 2 (two) times daily.  . sodium chloride (OCEAN) 0.65 % SOLN nasal spray Place 2 sprays every 4 (four) hours as needed into both nostrils for congestion.  . Tiotropium Bromide Monohydrate (SPIRIVA RESPIMAT) 1.25 MCG/ACT AERS Inhale 2 puffs daily into the lungs. (Patient taking differently: Inhale 2 puffs into the lungs 3 (three) times a week. Monday, Wednesday and Friday)                   Past Medical History:  CHRONIC RHINITIS (ICD-472.0)  EDEMA (ICD-782.3)  ACUTE AND CHRONIC RESPIRATORY FAILURE (ICD-518.84)  MORBID OBESITY (ICD-278.01)  - Target wt = 179 for BMI < 30 peak wt 282  - Referred back to nutrition December 18, 2007  DEEP VENOUS THROMBOPHLEBITIS, HX OF (ICD-V12.52)  COPD (ICD-496)..........................................................Marland KitchenWert  - PFTs 07/25/06 FEV1 55% ratio 53, DLC0 51%  - HFA 50% December 16, 2009 > 75% March 27, 2010  DEPRESSION (ICD-311)  OSTEOPOROSIS (ICD-733.00) last BMD 4/08 -2.4, intolerant of bisphosphonates  HYPERTENSION (ICD-401.9)  Atrial fibrillation - paroxysmal  Anticoagulation therapy  Pulmonary embolism, hx of  neg stress myoview 10/07  Anxiety   glucose intolerance - on steroids  Hyperlipidemia  Complex medical regimen            Objective:   Physical Exam      Wt  274 09/01/10 >   09/29/2010  275 > 01/01/2011  280 > 04/29/2011  279 > 285 11/17/2011  > 288 02/18/2012 > 281 04/19/2012 > 03/13/2013  282 >  09/11/2013  282 >  11/26/2014  259 > 11/27/2015  273 > 02/02/2016   272 > 05/04/2016   261>  10/27/2016  260> 01/26/2017   259 > 03/10/2017   260 > 04/26/2017   258   amb hoarse wf  nad  Vital signs reviewed - Note on arrival 02 sats  97% on 4lpm      HEENT: nl dentition, turbinates bilaterally, and oropharynx. Nl external ear canals without cough reflex   NECK :  without JVD/Nodes/TM/ nl carotid upstrokes bilaterally   LUNGS: no acc muscle use,  Nl contour chest which is clear to A and P bilaterally without cough on insp or exp maneuvers   CV:  RRR  no s3 or murmur or increase in P2, and no edema   ABD:  Quite obese soft and nontender with poor inspiratory excursion in the supine position. No bruits or organomegaly appreciated, bowel sounds nl  MS:  Nl gait/ ext warm without deformities, calf tenderness, cyanosis or clubbing No obvious joint restrictions   SKIN: warm and dry without lesions    NEURO:  alert, approp, nl sensorium with  no motor or cerebellar deficits apparent.

## 2017-04-26 NOTE — Patient Instructions (Signed)
If not improving >  Prednisone 10 mg take  4 each am x 2 days,   2 each am x 2 days,  1 each am x 2 days and stop    Follow the med calendar with the new action plan off adding afrin x 5 days a few minutes before your flonase / astelin    See [name] NP in 3 months with all your medications, even over the counter meds, separated in two separate bags, the ones you take no matter what vs the ones you stop once you feel better and take only as needed when you feel you need them.   [name]  will generate for you a new user friendly medication calendar that will put [place] all on the same page re: your medication use.

## 2017-04-27 ENCOUNTER — Encounter: Payer: Self-pay | Admitting: Internal Medicine

## 2017-04-27 ENCOUNTER — Telehealth: Payer: Self-pay | Admitting: Internal Medicine

## 2017-04-27 MED ORDER — PREDNISONE 10 MG PO TABS: ORAL_TABLET | ORAL | 0 refills | Status: DC

## 2017-04-27 NOTE — Assessment & Plan Note (Signed)
Reviewed approp use of afrin: I emphasized that nasal steroids have no immediate benefit in terms of improving symptoms.  To help them reached the target tissue, the patient should use Afrin two puffs every 12 hours applied one min before using the nasal steroids.  Afrin should be stopped after no more than 5 days.  If the symptoms worsen, Afrin can be restarted after 5 days off of therapy to prevent rebound congestion from overuse of Afrin.  I also emphasized that in no way are nasal steroids a concern in terms of "addiction".    If not improving >  Prednisone 10 mg take  4 each am x 2 days,   2 each am x 2 days,  1 each am x 2 days and stop

## 2017-04-27 NOTE — Assessment & Plan Note (Signed)
Body mass index is 47.19 kg/m.  -  trending down slightly / encouraged to wear 4lpm with acitivity to help burn fat  Lab Results  Component Value Date   TSH 4.59 (H) 11/08/2016     Contributing to gerd risk/ doe/reviewed the need and the process to achieve and maintain neg calorie balance > defer f/u primary care including intermittently monitoring thyroid status

## 2017-04-27 NOTE — Assessment & Plan Note (Signed)
-   PFTs 07/25/06 FEV1 55% ratio 53, DLC0 51%  -  PFT's 04/19/2012 FEV1 50% (ratio 51) with 19% improvement p B2, dlco 42 > 99% corrected  - med calendar 01/17/2012  -Tudorza trial  01/17/12 > permanently d/c 11/27/2015 due to uacs   - 01/26/2017  After extensive coaching HFA effectiveness =    75% (short Ti) change to spiriva 1.25 one puff each am (higher doses not tol)   Despite active rhinitis ? allergic > adequate control on present rx, reviewed in detail with pt > no change in rx needed

## 2017-04-27 NOTE — Telephone Encounter (Signed)
Spoke with pt. States that she saw Dr. Melvyn Novas yesterday and was prescribed a prednisone taper. This prescription was sent to her mail order pharmacy instead of her local pharmacy. Rx has been sent to the correct pharmacy. Nothing further was needed.

## 2017-04-27 NOTE — Assessment & Plan Note (Signed)
-   09/29/10  Walked 3lpm  2 laps @ 185 ft each stopped due to  Sob with sats 90%     - HC03 11/08/11 = 34     - 11-17-11--o2 sat on ra at rest 83%ra     - 02/18/2012  Sat 88% RA across the room > Walked 3lpm x 3 laps @ 185 ft each stopped due to  End of study no desat     - 04/19/2012  Walked 3lpm x 3 laps @ 185 ft each stopped due to  End of study, sats dropped to 88 at very end  - HC03  34    08/01/15  - 05/04/2016 Patient Saturations on Room Air at Rest = 86% Patient Saturations on 4 Liters of oxygen while Ambulating = 90% Please briefly explain why patient needs home oxygen: Pt oxygen drops while walking. - 10/27/2016   Walked 4lpm POC  x one lap @ 185 stopped due to  Sob with desat 87% slow pace      As of 04/26/2017 rec 2.5 lpm at rest and up to  4lpm pulsed walking(highest setting)   Adequate control on present rx, reviewed in detail with pt > no change in rx needed

## 2017-05-10 ENCOUNTER — Encounter: Payer: Self-pay | Admitting: Cardiology

## 2017-05-10 ENCOUNTER — Other Ambulatory Visit: Payer: Self-pay | Admitting: Cardiology

## 2017-05-10 ENCOUNTER — Encounter: Payer: Self-pay | Admitting: *Deleted

## 2017-05-10 MED ORDER — KLOR-CON M20 20 MEQ PO TBCR: 20.0000 meq | EXTENDED_RELEASE_TABLET | Freq: Two times a day (BID) | ORAL | 2 refills | Status: DC

## 2017-05-10 NOTE — Telephone Encounter (Signed)
New Message:        *STAT* If patient is at the pharmacy, call can be transferred to refill team.   1. Which medications need to be refilled? (please list name of each medication and dose if known) KLOR-CON M20 20 MEQ tablet(Expired)  2. Which pharmacy/location (including street and city if local pharmacy) is medication to be sent to?[facility], [place]  3. Do they need a 30 day or 90 day supply? 90    Pt states she only has 8 pills left.

## 2017-05-10 NOTE — Telephone Encounter (Signed)
This encounter was created in error - please disregard.

## 2017-05-10 NOTE — Telephone Encounter (Signed)
Refilled per Rx in last office note

## 2017-05-12 DIAGNOSIS — Z1389 Encounter for screening for other disorder: Secondary | ICD-10-CM | POA: Diagnosis not present

## 2017-05-12 DIAGNOSIS — Z130 Encounter for screening for diseases of the blood and blood-forming organs and certain disorders involving the immune mechanism: Secondary | ICD-10-CM | POA: Diagnosis not present

## 2017-05-12 DIAGNOSIS — Z6841 Body Mass Index (BMI) 40.0 and over, adult: Secondary | ICD-10-CM | POA: Diagnosis not present

## 2017-05-12 DIAGNOSIS — Z01419 Encounter for gynecological examination (general) (routine) without abnormal findings: Secondary | ICD-10-CM | POA: Diagnosis not present

## 2017-05-12 DIAGNOSIS — N816 Rectocele: Secondary | ICD-10-CM | POA: Diagnosis not present

## 2017-05-12 DIAGNOSIS — Z1231 Encounter for screening mammogram for malignant neoplasm of breast: Secondary | ICD-10-CM | POA: Diagnosis not present

## 2017-05-12 DIAGNOSIS — N9412 Deep dyspareunia: Secondary | ICD-10-CM | POA: Diagnosis not present

## 2017-05-13 ENCOUNTER — Ambulatory Visit (INDEPENDENT_AMBULATORY_CARE_PROVIDER_SITE_OTHER): Payer: BLUE CROSS/BLUE SHIELD | Admitting: Internal Medicine

## 2017-05-13 ENCOUNTER — Encounter: Payer: Self-pay | Admitting: Internal Medicine

## 2017-05-13 ENCOUNTER — Ambulatory Visit (INDEPENDENT_AMBULATORY_CARE_PROVIDER_SITE_OTHER)
Admission: RE | Admit: 2017-05-13 | Discharge: 2017-05-13 | Disposition: A | Discharge status: Home / Self Care | Payer: BLUE CROSS/BLUE SHIELD | Source: Ambulatory Visit | Attending: Internal Medicine | Admitting: Internal Medicine

## 2017-05-13 VITALS — BP 136/86 | HR 90 | Temp 98.2°F | Ht 62.0 in | Wt 254.0 lb

## 2017-05-13 DIAGNOSIS — J449 Chronic obstructive pulmonary disease, unspecified: Secondary | ICD-10-CM

## 2017-05-13 DIAGNOSIS — R1031 Right lower quadrant pain: Secondary | ICD-10-CM

## 2017-05-13 DIAGNOSIS — R7302 Impaired glucose tolerance (oral): Secondary | ICD-10-CM

## 2017-05-13 DIAGNOSIS — Z0001 Encounter for general adult medical examination with abnormal findings: Secondary | ICD-10-CM | POA: Diagnosis not present

## 2017-05-13 DIAGNOSIS — R739 Hyperglycemia, unspecified: Secondary | ICD-10-CM | POA: Diagnosis not present

## 2017-05-13 DIAGNOSIS — J309 Allergic rhinitis, unspecified: Secondary | ICD-10-CM | POA: Diagnosis not present

## 2017-05-13 LAB — POCT GLYCOSYLATED HEMOGLOBIN (HGB A1C): Hemoglobin A1C: 5

## 2017-05-13 MED ORDER — PREDNISONE 10 MG PO TABS: ORAL_TABLET | ORAL | 0 refills | Status: DC

## 2017-05-13 NOTE — Progress Notes (Signed)
Subjective:    Patient ID: [name] [name], female    DOB: 1950-08-15, 67 y.o.   MRN: [id number]  HPI  Here for wellness and f/u;  Overall doing ok;  Pt denies Chest pain, worsening SOB, DOE, wheezing, orthopnea, PND, worsening LE edema, palpitations, dizziness or syncope.  Pt denies neurological change such as new headache, facial or extremity weakness.  Pt denies polydipsia, polyuria, or low sugar symptoms. Pt states overall good compliance with treatment and medications, good tolerability, and has been trying to follow appropriate diet.  Pt denies worsening depressive symptoms, suicidal ideation or panic. No fever, night sweats, wt loss, loss of appetite, or other constitutional symptoms.  Pt states good ability with ADL's, has low fall risk, home safety reviewed and adequate, no other significant changes in hearing or vision, and only occasionally active with exercise.  Just had mammogram and pap with labs at GYN, does not want further labs today.   Does have several wks ongoing nasal allergy symptoms with clearish congestion, itch and cough and sneezing, without fever, pain, ST, swelling or wheezing.  Also incidentally has 1-2 mo worsening right groin pain, sharp and dull, mild to mod, better with alleve prn, worse to walk, better to sit, did almost have a giveaway episode recently but fortunately did not fall.  Past Medical History:  Diagnosis Date  . Anemia    hx  . Anxiety   . Arthritis   . Asthma   . Atrial fibrillation and flutter ([place])   . Atypical lobular hyperplasia of right breast 05/09/2015  . Breast CA ([place])    ?  Marland Kitchen Chronic rhinitis   . COPD (chronic obstructive pulmonary disease) ([place])    [facility]. PFTs 07/25/06 FEV1 55% ratio 53, DLC0 51% HFA 50% 12/16/2009  . Depression   . FRACTURE, RIB, RIGHT 06/18/2009  . Glucose intolerance (impaired glucose tolerance)    on steroids  . History of DVT (deep vein thrombosis)   . Hx of cardiac catheterization    LHC (01/2001):  Normal Cors.  EF  65%.;  LexiScan Myoview (03/2013): No ischemia, EF 61%, normal  . Hx of echocardiogram    Echo (11/2011):  Mod LVH, EF 60-65%, no RWMA, MAC, mild LAE, PASP 34.  Marland Kitchen HYPERLIPIDEMIA   . Hypertension   . Hypothyroidism   . Morbid obesity ([place])    target wt=179lb for BMI<30 Peak wt 282lb  . Osteoporosis    last BMD 4/08 -2.4, intolerant of bisphosphonates  . Peripheral vascular disease (H[facility])    hx dvt.pe  . Pneumonia    hx  . PONV (postoperative nausea and vomiting)   . PREMATURE VENTRICULAR CONTRACTIONS   . PULMONARY EMBOLISM, HX OF 06/19/2007  . Rhabdomyolysis 07/29/2009  . Tremor   . VITAMIN D DEFICIENCY    Past Surgical History:  Procedure Laterality Date  . A-FLUTTER ABLATION N/A 10/08/2016   Procedure: A-FLUTTER ABLATION;  Surgeon: Evans Lance, MD;  Location: [place] [facility];  Service: Cardiovascular;  Laterality: N/A;  . ABDOMINAL HYSTERECTOMY    . BREAST LUMPECTOMY WITH RADIOACTIVE SEED LOCALIZATION Right 06/05/2015   Procedure: RIGHT BREAST LUMPECTOMY WITH RADIOACTIVE SEED LOCALIZATION;  Surgeon: Fanny Skates, MD;  Location: [place];  Service: General;  Laterality: Right;  . BREAST SURGERY Left    s/p mass removal  . CARDIOVERSION N/A 12/11/2014   Procedure: CARDIOVERSION;  Surgeon: Josue Hector, MD;  Location: [facility]COPY;  Service: Cardiovascular;  Laterality: N/A;  . CARDIOVERSION N/A 09/15/2016  Procedure: CARDIOVERSION;  Surgeon: Jerline Pain, MD;  Location: [facility] ENDOSCOPY;  Service: Cardiovascular;  Laterality: N/A;  . COLONOSCOPY  10/22/2003, ?2006  . COLONOSCOPY WITH PROPOFOL N/A 01/15/2016   Procedure: COLONOSCOPY WITH PROPOFOL;  Surgeon: Gatha Mayer, MD;  Location: [facility] ENDOSCOPY;  Service: Endoscopy;  Laterality: N/A;  . ESOPHAGOGASTRODUODENOSCOPY (EGD) WITH PROPOFOL N/A 01/15/2016   Procedure: ESOPHAGOGASTRODUODENOSCOPY (EGD) WITH PROPOFOL;  Surgeon: Gatha Mayer, MD;  Location: [facility]OS[facility];  Service: Endoscopy;  Laterality: N/A;  . s/p left arm fracture with  fall off chair    . skin graft to middle R finger  1975  . TEE WITHOUT CARDIOVERSION N/A 12/11/2014   Procedure: TRANSESOPHAGEAL ECHOCARDIOGRAM (TEE);  Surgeon: Josue Hector, MD;  Location: [facility] ENDOSCOPY;  Service: Cardiovascular;  Laterality: N/A;  . TONSILLECTOMY      reports that she quit smoking about 21 years ago. Her smoking use included cigarettes. She has a 35.00 pack-year smoking history. She has never used smokeless tobacco. She reports that she does not drink alcohol or use drugs. family history includes Asthma in her brother and son; Esophageal cancer in her brother; Heart disease in her mother; Liver disease in her mother. Allergies  Allergen Reactions  . Duloxetine Other (See Comments)    REACTION: rhabdomyolysis  . Penicillins Other (See Comments)    SYNCOPE Has patient had a PCN reaction causing immediate rash, facial/tongue/throat swelling, SOB or lightheadedness with hypotension: Yes Has patient had a PCN reaction causing severe rash involving mucus membranes or skin necrosis: No Has patient had a PCN reaction that required hospitalization No Has patient had a PCN reaction occurring within the last 10 years: No If all of the above answers are "NO", then may proceed with Cephalosporin use.   . Latex Rash   Current Outpatient Medications on File Prior to Visit  Medication Sig Dispense Refill  . acetaminophen (TYLENOL) 650 MG CR tablet Take 650-1,300 mg by mouth as needed for pain. Per bottle as needed    . albuterol (PROAIR HFA) 108 (90 Base) MCG/ACT inhaler Inhale 2 puffs into the lungs every 6 (six) hours as needed for wheezing or shortness of breath.    Marland Kitchen apixaban (ELIQUIS) 5 MG TABS tablet Take 1 tablet (5 mg total) by mouth 2 (two) times daily. 180 tablet 1  . atorvastatin (LIPITOR) 10 MG tablet Take 1 tablet (10 mg total) by mouth daily. 90 tablet 2  . azelastine (ASTELIN) 0.1 % nasal spray Place 2 sprays at bedtime as needed into both nostrils for rhinitis. Use in  each nostril as directed    . budesonide-formoterol (SYMBICORT) 160-4.5 MCG/ACT inhaler Inhale 2 puffs 2 (two) times daily into the lungs. 3 Inhaler 1  . Calcium Carbonate-Vitamin D 600-400 MG-UNIT per tablet Take 1 tablet by mouth 2 (two) times daily.      . cetirizine (ZYRTEC) 10 MG tablet Take 10 mg daily as needed by mouth for allergies.    . Dextromethorphan-Guaifenesin (MUCINEX DM MAXIMUM STRENGTH) 60-1200 MG TB12 Take 1 tablet by mouth every 12 (twelve) hours as needed (cough/congestion). With flutter    . famotidine (PEPCID) 20 MG tablet One at bedtime 90 tablet 3  . ferrous sulfate 325 (65 FE) MG tablet Take 325 mg by mouth 2 (two) times daily with a meal.     . FLUoxetine (PROZAC) 40 MG capsule TAKE 1 CAPSULE DAILY (Patient taking differently: TAKE 40 mg CAPSULE DAILY) 90 capsule 3  . fluticasone (FLONASE) 50 MCG/ACT nasal spray Place  2 sprays into both nostrils 2 (two) times daily as needed for allergies or rhinitis.    . furosemide (LASIX) 80 MG tablet TAKE ONE AND ONE-HALF TABLETS IN THE MORNING AND 1 TABLET IN THE AFTERNOON 225 tablet 1  . glucosamine-chondroitin 500-400 MG tablet Take 1 tablet by mouth every morning.     Marland Kitchen KLOR-CON M20 20 MEQ tablet Take 1 tablet (20 mEq total) by mouth 2 (two) times daily. 180 tablet 2  . levalbuterol (XOPENEX) 1.25 MG/3ML nebulizer solution Take 1.25 mg by nebulization every 4 (four) hours. 360 mL 11  . levothyroxine (SYNTHROID, LEVOTHROID) 25 MCG tablet TAKE 1 TABLET DAILY BEFORE BREAKFAST 90 tablet 0  . metoprolol tartrate (LOPRESSOR) 25 MG tablet Take 1 tablet (25 mg total) by mouth 2 (two) times daily. 60 tablet 0  . Olopatadine HCl (PATADAY) 0.2 % SOLN Place 1 drop into both eyes daily.    . OXYGEN Inhale 2.5 L/min into the lungs continuous. And 4 with exertion    . pantoprazole (PROTONIX) 40 MG tablet Take 1 tablet (40 mg total) by mouth daily. Take 30-60 min before first meal of the day 90 tablet 3  . propafenone (RYTHMOL SR) 325 MG 12 hr  capsule Take 1 capsule (325 mg total) by mouth 2 (two) times daily. 180 capsule 3  . sodium chloride (OCEAN) 0.65 % SOLN nasal spray Place 2 sprays every 4 (four) hours as needed into both nostrils for congestion.    . Tiotropium Bromide Monohydrate (SPIRIVA RESPIMAT) 1.25 MCG/ACT AERS Inhale 2 puffs daily into the lungs. (Patient taking differently: Inhale 2 puffs into the lungs 3 (three) times a week. Monday, Wednesday and Friday) 1 Inhaler 3   No current facility-administered medications on file prior to visit.    Review of Systems Constitutional: Negative for other unusual diaphoresis, sweats, appetite or weight changes HENT: Negative for other worsening hearing loss, ear pain, facial swelling, mouth sores or neck stiffness.   Eyes: Negative for other worsening pain, redness or other visual disturbance.  Respiratory: Negative for other stridor or swelling Cardiovascular: Negative for other palpitations or other chest pain  Gastrointestinal: Negative for worsening diarrhea or loose stools, blood in stool, distention or other pain Genitourinary: Negative for hematuria, flank pain or other change in urine volume.  Musculoskeletal: Negative for myalgias or other joint swelling.  Skin: Negative for other color change, or other wound or worsening drainage.  Neurological: Negative for other syncope or numbness. Hematological: Negative for other adenopathy or swelling Psychiatric/Behavioral: Negative for hallucinations, other worsening agitation, SI, self-injury, or new decreased concentration All other system neg per pt    Objective:   Physical Exam BP 136/86   Pulse 90   Temp 98.2 F (36.8 C) (Oral)   Ht 5' 2"  (1.575 m)   Wt 254 lb (115.2 kg)   SpO2 93%   BMI 46.46 kg/m  VS noted, obese, fatigued, chronic ill, on home o2 Constitutional: Pt is oriented to person, place, and time. Appears well-developed and well-nourished, in no significant distress and comfortable Head: Normocephalic  and atraumatic  Eyes: Conjunctivae and EOM are normal. Pupils are equal, round, and reactive to light Right Ear: External ear normal without discharge Left Ear: External ear normal without discharge Nose: Nose without discharge or deformity Bilat tm's with mild erythema.  Max sinus areas non tender.  Pharynx with mild erythema, no exudate Mouth/Throat: Oropharynx is without other ulcerations and moist  Neck: Normal range of motion. Neck supple. No JVD present. No  tracheal deviation present or significant neck LA or mass Cardiovascular: Normal rate, regular rhythm, normal heart sounds and intact distal pulses.   Pulmonary/Chest: WOB normal and breath sounds without rales or wheezing  Abdominal: Soft. Bowel sounds are normal. NT. No HSM  Musculoskeletal: Normal range of motion. Exhibits no edema Lymphadenopathy: Has no other cervical adenopathy.  Neurological: Pt is alert and oriented to person, place, and time. Pt has normal reflexes. No cranial nerve deficit. Motor grossly intact, Gait intact Skin: Skin is warm and dry. No rash noted or new ulcerations Psychiatric:  Has normal mood and affect. Behavior is normal without agitation No other exam findings POCT glycosylated hemoglobin (Hb A1C)  Component 11:34  Hemoglobin A1C 5.0           Assessment & Plan:

## 2017-05-13 NOTE — Patient Instructions (Addendum)
Your A1c was OK today  Please take all new medication as prescribed - the prednisone  Please continue all other medications as before, and refills have been done if requested.  Please have the pharmacy call with any other refills you may need.  Please continue your efforts at being more active, low cholesterol diet, and weight control.  You are otherwise up to date with prevention measures today.  Please keep your appointments with your specialists as you may have planned  Please go to the XRAY Department in the Basement (go straight as you get off the elevator) for the x-ray testing  You will be contacted by phone if any changes need to be made immediately.  Otherwise, you will receive a letter about your results with an explanation, but please check with MyChart first.  Please remember to sign up for MyChart if you have not done so, as this will be important to you in the future with finding out test results, communicating by private email, and scheduling acute appointments online when needed.  Please return in 6 months, or sooner if needed

## 2017-05-14 NOTE — Assessment & Plan Note (Addendum)
Mod seasonal uncontrolled, to continue zyrtec, optivar, nasacort, and short prednisone course  In addition to the time spent performing CPE, I spent an additional 25 minutes face to face,in which greater than 50% of this time was spent in counseling and coordination of care for patient's acute illness as documented, including the differential dx, treatment, further evaluation and other management of allergic rhinitis uncontrolled, copd, hyperglycemia, and right groin pain

## 2017-05-14 NOTE — Assessment & Plan Note (Signed)

## 2017-05-14 NOTE — Assessment & Plan Note (Signed)
stable overall by history and exam, recent data reviewed with pt, and pt to continue medical treatment as before,  to f/u any worsening symptoms or concerns  

## 2017-05-14 NOTE — Assessment & Plan Note (Signed)
stable overall by history and exam, and pt to continue medical treatment as before,  to f/u any worsening symptoms or concerns 

## 2017-05-14 NOTE — Assessment & Plan Note (Signed)
suspicous for OA right hip, declines any pain tx, but for hip films and to f/u with sports medicine

## 2017-05-23 ENCOUNTER — Other Ambulatory Visit: Payer: Self-pay | Admitting: Obstetrics and Gynecology

## 2017-05-23 DIAGNOSIS — R928 Other abnormal and inconclusive findings on diagnostic imaging of breast: Secondary | ICD-10-CM

## 2017-05-25 ENCOUNTER — Ambulatory Visit: Payer: BLUE CROSS/BLUE SHIELD

## 2017-05-25 ENCOUNTER — Ambulatory Visit
Admission: RE | Admit: 2017-05-25 | Discharge: 2017-05-25 | Disposition: A | Discharge status: Home / Self Care | Payer: BLUE CROSS/BLUE SHIELD | Source: Ambulatory Visit | Attending: Obstetrics and Gynecology | Admitting: Obstetrics and Gynecology

## 2017-05-25 DIAGNOSIS — R928 Other abnormal and inconclusive findings on diagnostic imaging of breast: Secondary | ICD-10-CM

## 2017-06-04 ENCOUNTER — Other Ambulatory Visit: Payer: Self-pay | Admitting: Internal Medicine

## 2017-06-10 ENCOUNTER — Encounter: Payer: Self-pay | Admitting: Internal Medicine

## 2017-06-10 ENCOUNTER — Ambulatory Visit (INDEPENDENT_AMBULATORY_CARE_PROVIDER_SITE_OTHER): Payer: BLUE CROSS/BLUE SHIELD | Admitting: Internal Medicine

## 2017-06-10 VITALS — BP 136/84 | HR 100 | Temp 98.4°F | Ht 62.0 in | Wt 266.0 lb

## 2017-06-10 DIAGNOSIS — J309 Allergic rhinitis, unspecified: Secondary | ICD-10-CM | POA: Diagnosis not present

## 2017-06-10 DIAGNOSIS — R05 Cough: Secondary | ICD-10-CM | POA: Diagnosis not present

## 2017-06-10 DIAGNOSIS — J449 Chronic obstructive pulmonary disease, unspecified: Secondary | ICD-10-CM | POA: Diagnosis not present

## 2017-06-10 DIAGNOSIS — R059 Cough, unspecified: Secondary | ICD-10-CM

## 2017-06-10 MED ORDER — HYDROCODONE-HOMATROPINE 5-1.5 MG/5ML PO SYRP: 5.0000 mL | ORAL_SOLUTION | Freq: Four times a day (QID) | ORAL | 0 refills | Status: AC | PRN

## 2017-06-10 MED ORDER — MONTELUKAST SODIUM 10 MG PO TABS: 10.0000 mg | ORAL_TABLET | Freq: Every day | ORAL | 3 refills | Status: DC

## 2017-06-10 MED ORDER — METHYLPREDNISOLONE ACETATE 80 MG/ML IJ SUSP
80.0000 mg | Freq: Once | INTRAMUSCULAR | Status: AC
  Administered 2017-06-10: 80 mg via INTRAMUSCULAR

## 2017-06-10 NOTE — Progress Notes (Signed)
Subjective:    Patient ID: [name] [name], female    DOB: 1950/02/04, 67 y.o.   MRN: [id number]  HPI  Here to f/u allergy symptoms, Does have several wks ongoing nasal allergy symptoms with clearish congestion, itch and sneezing, without fever, pain, ST, cough, swelling or wheezing. Allergies worse despite staying in the house and taking nasal spray, eye drop and zyrtec and mucinex, S/p 3 rounds of prednisone with symptoms better and worst, and now worst of all, declines further prednisone.  No fever but Sinus congestion and ear aching, just miserable with cough that wont quit  Pt denies chest pain, increased sob or doe, wheezing, orthopnea, PND, increased LE swelling, palpitations, dizziness or syncope.  Pt denies new neurological symptoms such as new headache, or facial or extremity weakness or numbness   Pt denies polydipsia, polyuria  No other new complaints or interval hx Past Medical History:  Diagnosis Date  . Anemia    hx  . Anxiety   . Arthritis   . Asthma   . Atrial fibrillation and flutter ([place])   . Atypical lobular hyperplasia of right breast 05/09/2015  . Breast CA ([place])    ?  Marland Kitchen Chronic rhinitis   . COPD (chronic obstructive pulmonary disease) ([place])    [facility]. PFTs 07/25/06 FEV1 55% ratio 53, DLC0 51% HFA 50% 12/16/2009  . Depression   . FRACTURE, RIB, RIGHT 06/18/2009  . Glucose intolerance (impaired glucose tolerance)    on steroids  . History of DVT (deep vein thrombosis)   . Hx of cardiac catheterization    LHC (01/2001):  Normal Cors.  EF 65%.;  LexiScan Myoview (03/2013): No ischemia, EF 61%, normal  . Hx of echocardiogram    Echo (11/2011):  Mod LVH, EF 60-65%, no RWMA, MAC, mild LAE, PASP 34.  Marland Kitchen HYPERLIPIDEMIA   . Hypertension   . Hypothyroidism   . Morbid obesity ([place])    target wt=179lb for BMI<30 Peak wt 282lb  . Osteoporosis    last BMD 4/08 -2.4, intolerant of bisphosphonates  . Peripheral vascular disease (H[facility])    hx dvt.pe  . Pneumonia    hx  . PONV  (postoperative nausea and vomiting)   . PREMATURE VENTRICULAR CONTRACTIONS   . PULMONARY EMBOLISM, HX OF 06/19/2007  . Rhabdomyolysis 07/29/2009  . Tremor   . VITAMIN D DEFICIENCY    Past Surgical History:  Procedure Laterality Date  . A-FLUTTER ABLATION N/A 10/08/2016   Procedure: A-FLUTTER ABLATION;  Surgeon: Evans Lance, MD;  Location: [place] [facility];  Service: Cardiovascular;  Laterality: N/A;  . ABDOMINAL HYSTERECTOMY    . BREAST LUMPECTOMY WITH RADIOACTIVE SEED LOCALIZATION Right 06/05/2015   Procedure: RIGHT BREAST LUMPECTOMY WITH RADIOACTIVE SEED LOCALIZATION;  Surgeon: Fanny Skates, MD;  Location: [place];  Service: General;  Laterality: Right;  . BREAST SURGERY Left    s/p mass removal  . CARDIOVERSION N/A 12/11/2014   Procedure: CARDIOVERSION;  Surgeon: Josue Hector, MD;  Location: [place];  Service: Cardiovascular;  Laterality: N/A;  . CARDIOVERSION N/A 09/15/2016   Procedure: CARDIOVERSION;  Surgeon: Jerline Pain, MD;  Location: [facility] ENDOSCOPY;  Service: Cardiovascular;  Laterality: N/A;  . COLONOSCOPY  10/22/2003, ?2006  . COLONOSCOPY WITH PROPOFOL N/A 01/15/2016   Procedure: COLONOSCOPY WITH PROPOFOL;  Surgeon: Gatha Mayer, MD;  Location: [facility] ENDOSCOPY;  Service: Endoscopy;  Laterality: N/A;  . ESOPHAGOGASTRODUODENOSCOPY (EGD) WITH PROPOFOL N/A 01/15/2016   Procedure: ESOPHAGOGASTRODUODENOSCOPY (EGD) WITH PROPOFOL;  Surgeon: Gatha Mayer,  MD;  Location: [facility] ENDOSCOPY;  Service: Endoscopy;  Laterality: N/A;  . s/p left arm fracture with fall off chair    . skin graft to middle R finger  1975  . TEE WITHOUT CARDIOVERSION N/A 12/11/2014   Procedure: TRANSESOPHAGEAL ECHOCARDIOGRAM (TEE);  Surgeon: Josue Hector, MD;  Location: [facility] ENDOSCOPY;  Service: Cardiovascular;  Laterality: N/A;  . TONSILLECTOMY      reports that she quit smoking about 21 years ago. Her smoking use included cigarettes. She has a 35.00 pack-year smoking history. She has never used smokeless  tobacco. She reports that she does not drink alcohol or use drugs. family history includes Asthma in her brother and son; Esophageal cancer in her brother; Heart disease in her mother; Liver disease in her mother. Allergies  Allergen Reactions  . Duloxetine Other (See Comments)    REACTION: rhabdomyolysis  . Penicillins Other (See Comments)    SYNCOPE Has patient had a PCN reaction causing immediate rash, facial/tongue/throat swelling, SOB or lightheadedness with hypotension: Yes Has patient had a PCN reaction causing severe rash involving mucus membranes or skin necrosis: No Has patient had a PCN reaction that required hospitalization No Has patient had a PCN reaction occurring within the last 10 years: No If all of the above answers are "NO", then may proceed with Cephalosporin use.   . Latex Rash   Current Outpatient Medications on File Prior to Visit  Medication Sig Dispense Refill  . acetaminophen (TYLENOL) 650 MG CR tablet Take 650-1,300 mg by mouth as needed for pain. Per bottle as needed    . albuterol (PROAIR HFA) 108 (90 Base) MCG/ACT inhaler Inhale 2 puffs into the lungs every 6 (six) hours as needed for wheezing or shortness of breath.    Marland Kitchen apixaban (ELIQUIS) 5 MG TABS tablet Take 1 tablet (5 mg total) by mouth 2 (two) times daily. 180 tablet 1  . atorvastatin (LIPITOR) 10 MG tablet Take 1 tablet (10 mg total) by mouth daily. 90 tablet 2  . azelastine (ASTELIN) 0.1 % nasal spray Place 2 sprays at bedtime as needed into both nostrils for rhinitis. Use in each nostril as directed    . budesonide-formoterol (SYMBICORT) 160-4.5 MCG/ACT inhaler Inhale 2 puffs 2 (two) times daily into the lungs. 3 Inhaler 1  . Calcium Carbonate-Vitamin D 600-400 MG-UNIT per tablet Take 1 tablet by mouth 2 (two) times daily.      . cetirizine (ZYRTEC) 10 MG tablet Take 10 mg daily as needed by mouth for allergies.    . Dextromethorphan-Guaifenesin (MUCINEX DM MAXIMUM STRENGTH) 60-1200 MG TB12 Take 1  tablet by mouth every 12 (twelve) hours as needed (cough/congestion). With flutter    . famotidine (PEPCID) 20 MG tablet One at bedtime 90 tablet 3  . ferrous sulfate 325 (65 FE) MG tablet Take 325 mg by mouth 2 (two) times daily with a meal.     . FLUoxetine (PROZAC) 40 MG capsule TAKE 1 CAPSULE DAILY (Patient taking differently: TAKE 40 mg CAPSULE DAILY) 90 capsule 3  . fluticasone (FLONASE) 50 MCG/ACT nasal spray Place 2 sprays into both nostrils 2 (two) times daily as needed for allergies or rhinitis.    . furosemide (LASIX) 80 MG tablet TAKE ONE AND ONE-HALF TABLETS IN THE MORNING AND 1 TABLET IN THE AFTERNOON 225 tablet 1  . glucosamine-chondroitin 500-400 MG tablet Take 1 tablet by mouth every morning.     Marland Kitchen KLOR-CON M20 20 MEQ tablet Take 1 tablet (20 mEq total) by mouth 2 (  two) times daily. 180 tablet 2  . levalbuterol (XOPENEX) 1.25 MG/3ML nebulizer solution Take 1.25 mg by nebulization every 4 (four) hours. 360 mL 11  . levothyroxine (SYNTHROID, LEVOTHROID) 25 MCG tablet TAKE 1 TABLET DAILY BEFORE BREAKFAST 90 tablet 1  . metoprolol tartrate (LOPRESSOR) 25 MG tablet Take 1 tablet (25 mg total) by mouth 2 (two) times daily. 60 tablet 0  . Olopatadine HCl (PATADAY) 0.2 % SOLN Place 1 drop into both eyes daily.    . OXYGEN Inhale 2.5 L/min into the lungs continuous. And 4 with exertion    . pantoprazole (PROTONIX) 40 MG tablet Take 1 tablet (40 mg total) by mouth daily. Take 30-60 min before first meal of the day 90 tablet 3  . predniSONE (DELTASONE) 10 MG tablet 3 tabs by mouth per day for 3 days,2tabs per day for 3 days,1tab per day for 3 days 18 tablet 0  . propafenone (RYTHMOL SR) 325 MG 12 hr capsule Take 1 capsule (325 mg total) by mouth 2 (two) times daily. 180 capsule 3  . sodium chloride (OCEAN) 0.65 % SOLN nasal spray Place 2 sprays every 4 (four) hours as needed into both nostrils for congestion.    . Tiotropium Bromide Monohydrate (SPIRIVA RESPIMAT) 1.25 MCG/ACT AERS Inhale 2  puffs daily into the lungs. (Patient taking differently: Inhale 2 puffs into the lungs 3 (three) times a week. Monday, Wednesday and Friday) 1 Inhaler 3   No current facility-administered medications on file prior to visit.    Review of Systems  Constitutional: Negative for other unusual diaphoresis or sweats HENT: Negative for ear discharge or swelling Eyes: Negative for other worsening visual disturbances Respiratory: Negative for stridor or other swelling  Gastrointestinal: Negative for worsening distension or other blood Genitourinary: Negative for retention or other urinary change Musculoskeletal: Negative for other MSK pain or swelling Skin: Negative for color change or other new lesions Neurological: Negative for worsening tremors and other numbness  Psychiatric/Behavioral: Negative for worsening agitation or other fatigue All other system neg per pt    Objective:   Physical Exam BP 136/84   Pulse 100   Temp 98.4 F (36.9 C) (Oral)   Ht 5' 2"  (1.575 m)   Wt 266 lb (120.7 kg)   SpO2 95%   BMI 48.65 kg/m  VS noted, not ill appearing but tearful, nonprod cough repeats during exam Constitutional: Pt appears in NAD HENT: Head: NCAT.  Right Ear: External ear normal.  Left Ear: External ear normal.  Eyes: . Pupils are equal, round, and reactive to light. Conjunctivae and EOM are normal Nose: without d/c or deformity Bilat tm's with mild erythema.  Max sinus areas non tender.  Pharynx with mild erythema, no exudate Neck: Neck supple. Gross normal ROM Cardiovascular: Normal rate and regular rhythm.   Pulmonary/Chest: Effort normal and breath sounds decreased without rales or wheezing.  Neurological: Pt is alert. At baseline orientation, motor grossly intact Skin: Skin is warm. No rashes, other new lesions, no LE edema Psychiatric: Pt behavior is normal without agitation  No other exam findings  Lab Results  Component Value Date   WBC 6.7 11/08/2016   HGB 10.7 (L)  11/08/2016   HCT 31.2 (L) 11/08/2016   PLT 216.0 11/08/2016   GLUCOSE 160 (H) 11/08/2016   CHOL 125 11/08/2016   TRIG 105.0 11/08/2016   HDL 35.80 (L) 11/08/2016   LDLDIRECT 140.2 04/10/2010   LDLCALC 68 11/08/2016   ALT 7 11/08/2016   AST 9 11/08/2016  NA 146 (H) 11/08/2016   K 3.6 11/08/2016   CL 100 11/08/2016   CREATININE 0.56 11/08/2016   BUN 9 11/08/2016   CO2 39 (H) 11/08/2016   TSH 4.59 (H) 11/08/2016   INR 1.15 10/07/2016   HGBA1C 5.0 05/13/2017       Assessment & Plan:

## 2017-06-10 NOTE — Patient Instructions (Addendum)
You had the steroid shot today  Please take all new medication as prescribed - the singulair  Please continue all other medications as before, and refills have been done if requested - the cough medicine  Please have the pharmacy call with any other refills you may need.  Please continue your efforts at being more active, low cholesterol diet, and weight control.  You are otherwise up to date with prevention measures today.  Please keep your appointments with your specialists as you may have planned

## 2017-06-11 NOTE — Assessment & Plan Note (Signed)
Mild to mod, will avoid further prednisone, no s/s infection at this time, for singulair 10 qd, allegra otc, depomedrol IM 80, cough med prn, to f/u any worsening symptoms or concerns

## 2017-06-11 NOTE — Assessment & Plan Note (Signed)
stable overall by history and exam, and pt to continue medical treatment as before,  to f/u any worsening symptoms or concerns 

## 2017-06-11 NOTE — Assessment & Plan Note (Signed)
Just miserable with freq cough most likely due to post nasal gtt, for cough med prn,  to f/u any worsening symptoms or concerns

## 2017-06-29 ENCOUNTER — Telehealth: Payer: Self-pay | Admitting: Internal Medicine

## 2017-06-29 NOTE — Progress Notes (Signed)
@Patient  ID: [name] [name], female    DOB: [birth year], 67 y.o.   MRN: [id number]  Chief Complaint  Patient presents with  . Follow-up    Switching from spiriva to incruse due to insurance fee's. Needs teaching on incruse. Since starting spiriva she feels it has made her tremors worse. She does report the spiriva is effective. Last pred taper 5/31.     Referring provider: Biagio Borg, MD  HPI: 67 yowf last smoked 2003 with Morbid obesity/ COPD GOLD II  with minimum asthmatic component documented 07/25/06. On 02 24 hours per day at 3lpm   Recent [facility] Encounters:   03/10/2017  Acute ext ov/Wert re: GOLD II copd/ 02 dep/ acute cough  - no med cal  Chief Complaint  Patient presents with  . Acute Visit    cough for 3 days- non prod. She states her breathing is unchanged. She has been using her albuterol inhaler 1 x per wk on average.   Dyspnea:  Using 4lpm walking pulsed = . MMRC3 = can't walk 100 yards even at a slow pace at a flat grade s stopping due to sob   Cough: acutely x 3 days / 24/7 feels like started with a tickle in throat Sleep: 4 pillws/ 2.5 lpm  SABA use:  As above rec Depomedrol 120 mg IM today  Work on inhaler technique:    Stop omeprazole and start Pantoprazole (protonix) 40 mg   Take  30-60 min before first meal of the day and Pepcid (famotidine)  20 mg one @  bedtime   Keep your appt, bring all meds in two bags     04/26/2017  f/u ov/Wert re:  GOLD II but 02 dep resp failure / rhinitis/ /MO  - brought med calendar and some meds Chief Complaint  Patient presents with  . Follow-up    headaches, increased non-productive cough, increased SOB, some chest tightness. Reports inhalers are effective.   Dyspnea:  MMRC3 = can't walk 100 yards even at a slow pace at a flat grade s stopping due to sob  Even on 4lpm  Cough: dry daytime  Sleep: on 2.5 lpm and sev pillows  SABA use:  Maybe once a day neb > proair HA frontal x 1 week not worse in am attributes to  outdoor exposures to pollen assoc with sneezing/ watery rhinorrhea  No obvious day to day or daytime variability or assoc excess/ purulent sputum or mucus plugs or hemoptysis or cp or chest tightness, subjective wheeze or overt sinus or hb symptoms. No unusual exposure hx or h/o childhood pna/ asthma or knowledge of premature birth.  Sleeping  On 2.63mand sev pillows  without nocturnal  or early am exacerbation  of respiratory  c/o's or need for noct saba. Also denies any obvious fluctuation of symptoms with weather or environmental changes or other aggravating or alleviating factors except as outlined above    06/29/2017-telephone encounter- patient received letter notification that Spiriva will now be full price after July from [facility], patient needs to be switched to Incruse   Tests:  10/14/2016-chest x-ray- cardiomegaly and mild pulmonary edema    06/30/17 OV  67year old patient seen office today for follow-up because were having to switch her controller therapy.  Patient uses [facility] and Spiriva is no longer formulary.  [place]  Patient reporting that symptoms while she is been doing fine.  Has a continued follow-up with Dr. WMelvyn Novasnext  month.  Patient also reports that she has an allergy appointment on 07/01/17.   Patient does report having slight peripheral tremors since starting the Spiriva.  Allergies  Allergen Reactions  . Duloxetine Other (See Comments)    REACTION: rhabdomyolysis  . Penicillins Other (See Comments)    SYNCOPE Has patient had a PCN reaction causing immediate rash, facial/tongue/throat swelling, SOB or lightheadedness with hypotension: Yes Has patient had a PCN reaction causing severe rash involving mucus membranes or skin necrosis: No Has patient had a PCN reaction that required hospitalization No Has patient had a PCN reaction occurring within the last 10 years: No If all of the above answers are "NO",  then may proceed with Cephalosporin use.   . Latex Rash    Immunization History  Administered Date(s) Administered  . Influenza Split 10/14/2010, 10/13/2011  . Influenza Whole 10/02/2007, 10/14/2008, 10/16/2009  . Influenza, High Dose Seasonal PF 10/09/2016  . Influenza, Seasonal, Injecte, Preservative Fre 11/01/2013  . Influenza,inj,Quad PF,6+ Mos 10/17/2012, 10/17/2015  . Pneumococcal Conjugate-13 10/17/2012, 01/03/2013  . Pneumococcal Polysaccharide-23 09/12/2006, 07/27/2016  . Td 12/05/2007  . Zoster 10/14/2010    Past Medical History:  Diagnosis Date  . Anemia    hx  . Anxiety   . Arthritis   . Asthma   . Atrial fibrillation and flutter ([place])   . Atypical lobular hyperplasia of right breast 05/09/2015  . Breast CA ([place])    ?  Marland Kitchen Chronic rhinitis   . COPD (chronic obstructive pulmonary disease) ([place])    Wert. PFTs 07/25/06 FEV1 55% ratio 53, DLC0 51% HFA 50% 12/16/2009  . Depression   . FRACTURE, RIB, RIGHT 06/18/2009  . Glucose intolerance (impaired glucose tolerance)    on steroids  . History of DVT (deep vein thrombosis)   . Hx of cardiac catheterization    LHC (01/2001):  Normal Cors.  EF 65%.;  LexiScan Myoview (03/2013): No ischemia, EF 61%, normal  . Hx of echocardiogram    Echo (11/2011):  Mod LVH, EF 60-65%, no RWMA, MAC, mild LAE, PASP 34.  Marland Kitchen HYPERLIPIDEMIA   . Hypertension   . Hypothyroidism   . Morbid obesity ([place])    target wt=179lb for BMI<30 Peak wt 282lb  . Osteoporosis    last BMD 4/08 -2.4, intolerant of bisphosphonates  . Peripheral vascular disease ([facility])    hx dvt.pe  . Pneumonia    hx  . PONV (postoperative nausea and vomiting)   . PREMATURE VENTRICULAR CONTRACTIONS   . PULMONARY EMBOLISM, HX OF 06/19/2007  . Rhabdomyolysis 07/29/2009  . Tremor   . VITAMIN D DEFICIENCY     Tobacco History: Social History   Tobacco Use  Smoking Status Former Smoker  . Packs/day: 1.00  . Years: 35.00  . Pack years: 35.00  . Types: Cigarettes  . Last  attempt to quit: 01/12/1996  . Years since quitting: 21.4  Smokeless Tobacco Never Used   Counseling given: Yes Continue not smoking.  Outpatient Encounter Medications as of 06/30/2017  Medication Sig  . acetaminophen (TYLENOL) 650 MG CR tablet Take 650-1,300 mg by mouth as needed for pain. Per bottle as needed  . albuterol (PROAIR HFA) 108 (90 Base) MCG/ACT inhaler Inhale 2 puffs into the lungs every 6 (six) hours as needed for wheezing or shortness of breath.  Marland Kitchen apixaban (ELIQUIS) 5 MG TABS tablet Take 1 tablet (5 mg total) by mouth 2 (two) times daily.  Marland Kitchen atorvastatin (LIPITOR) 10 MG tablet Take 1 tablet (10 mg total) by  mouth daily.  Marland Kitchen azelastine (ASTELIN) 0.1 % nasal spray Place 2 sprays at bedtime as needed into both nostrils for rhinitis. Use in each nostril as directed  . budesonide-formoterol (SYMBICORT) 160-4.5 MCG/ACT inhaler Inhale 2 puffs 2 (two) times daily into the lungs.  . Calcium Carbonate-Vitamin D 600-400 MG-UNIT per tablet Take 1 tablet by mouth 2 (two) times daily.    . cetirizine (ZYRTEC) 10 MG tablet Take 10 mg daily as needed by mouth for allergies.  . Dextromethorphan-Guaifenesin (MUCINEX DM MAXIMUM STRENGTH) 60-1200 MG TB12 Take 1 tablet by mouth every 12 (twelve) hours as needed (cough/congestion). With flutter  . famotidine (PEPCID) 20 MG tablet One at bedtime  . ferrous sulfate 325 (65 FE) MG tablet Take 325 mg by mouth 2 (two) times daily with a meal.   . FLUoxetine (PROZAC) 40 MG capsule TAKE 1 CAPSULE DAILY (Patient taking differently: TAKE 40 mg CAPSULE DAILY)  . fluticasone (FLONASE) 50 MCG/ACT nasal spray Place 2 sprays into both nostrils 2 (two) times daily as needed for allergies or rhinitis.  . furosemide (LASIX) 80 MG tablet TAKE ONE AND ONE-HALF TABLETS IN THE MORNING AND 1 TABLET IN THE AFTERNOON  . glucosamine-chondroitin 500-400 MG tablet Take 1 tablet by mouth every morning.   Marland Kitchen KLOR-CON M20 20 MEQ tablet Take 1 tablet (20 mEq total) by mouth 2 (two)  times daily.  Marland Kitchen levalbuterol (XOPENEX) 1.25 MG/3ML nebulizer solution Take 1.25 mg by nebulization every 4 (four) hours.  Marland Kitchen levothyroxine (SYNTHROID, LEVOTHROID) 25 MCG tablet TAKE 1 TABLET DAILY BEFORE BREAKFAST  . metoprolol tartrate (LOPRESSOR) 25 MG tablet Take 1 tablet (25 mg total) by mouth 2 (two) times daily.  . montelukast (SINGULAIR) 10 MG tablet Take 1 tablet (10 mg total) by mouth daily.  . Olopatadine HCl (PATADAY) 0.2 % SOLN Place 1 drop into both eyes daily.  . OXYGEN Inhale 2.5 L/min into the lungs continuous. And 4 with exertion  . pantoprazole (PROTONIX) 40 MG tablet Take 1 tablet (40 mg total) by mouth daily. Take 30-60 min before first meal of the day  . predniSONE (DELTASONE) 10 MG tablet 3 tabs by mouth per day for 3 days,2tabs per day for 3 days,1tab per day for 3 days  . propafenone (RYTHMOL SR) 325 MG 12 hr capsule Take 1 capsule (325 mg total) by mouth 2 (two) times daily.  . sodium chloride (OCEAN) 0.65 % SOLN nasal spray Place 2 sprays every 4 (four) hours as needed into both nostrils for congestion.  . Tiotropium Bromide Monohydrate (SPIRIVA RESPIMAT) 1.25 MCG/ACT AERS Inhale 2 puffs daily into the lungs. (Patient taking differently: Inhale 2 puffs into the lungs 3 (three) times a week. Monday, Wednesday and Friday)  . umeclidinium bromide (INCRUSE ELLIPTA) 62.5 MCG/INH AEPB Inhale 1 puff into the lungs daily.   No facility-administered encounter medications on file as of 06/30/2017.      Review of Systems  Constitutional: +fatigue  No  weight loss, night sweats,  fevers, chills  HEENT:   No headaches,  Difficulty swallowing,  Tooth/dental problems, or  Sore throat, No sneezing, itching, ear ache, nasal congestion, post nasal drip  CV: No chest pain,  orthopnea, PND, swelling in lower extremities, anasarca, dizziness, palpitations, syncope  GI: No heartburn, indigestion, abdominal pain, nausea, vomiting, diarrhea, change in bowel habits, loss of appetite, bloody  stools Resp: No shortness of breath with exertion or at rest.  No excess mucus, no productive cough,  No non-productive cough,  No coughing up of  blood.  No change in color of mucus.  No wheezing.  No chest wall deformity Skin: no rash, lesions, no skin changes. GU: no dysuria, change in color of urine, no urgency or frequency.  No flank pain, no hematuria  MS: +slight tremors   No joint pain or swelling.  No decreased range of motion.  No back pain. Psych:  No change in mood or affect. No depression or anxiety.  No memory loss.   Physical Exam  BP 132/74   Pulse 68   Ht 5' 2"  (1.575 m)   Wt 261 lb 3.2 oz (118.5 kg)   SpO2 96%   BMI 47.77 kg/m    Wt Readings from Last 3 Encounters:  06/30/17 261 lb 3.2 oz (118.5 kg)  06/10/17 266 lb (120.7 kg)  05/13/17 254 lb (115.2 kg)    GEN: A/Ox3; pleasant , NAD, well nourished    HEENT:  [place]/AT,  EACs-clear, TMs-wnl, NOSE-clear, THROAT-clear, no lesions, no postnasal drip or exudate noted.   NECK:  Supple w/ fair ROM; no JVD; normal carotid impulses w/o bruits; no thyromegaly or nodules palpated; no lymphadenopathy.    RESP:  Clear  P & A; +diminished air movement in the bases w/o, wheezes/ rales/ or rhonchi. no accessory muscle use, no dullness to percussion  CARD:  RRR, no m/r/g, no peripheral edema, pulses intact, no cyanosis or clubbing.  GI:   Soft & nt; nml bowel sounds; no organomegaly or masses detected.   Musco: Warm bil, no deformities or joint swelling noted.   Neuro: alert, no focal deficits noted.    Skin: Warm, no lesions or rashes    Lab Results:  CBC    Component Value Date/Time   WBC 6.7 11/08/2016 1020   RBC 3.44 (L) 11/08/2016 1020   HGB 10.7 (L) 11/08/2016 1020   HCT 31.2 (L) 11/08/2016 1020   PLT 216.0 11/08/2016 1020   MCV 90.7 11/08/2016 1020   MCH 29.7 10/14/2016 1543   MCHC 34.4 11/08/2016 1020   RDW 17.3 (H) 11/08/2016 1020   LYMPHSABS 1.6 11/08/2016 1020   MONOABS 0.5 11/08/2016 1020   EOSABS  0.2 11/08/2016 1020   BASOSABS 0.0 11/08/2016 1020    BMET    Component Value Date/Time   NA 146 (H) 11/08/2016 1020   K 3.6 11/08/2016 1020   CL 100 11/08/2016 1020   CO2 39 (H) 11/08/2016 1020   GLUCOSE 160 (H) 11/08/2016 1020   BUN 9 11/08/2016 1020   CREATININE 0.56 11/08/2016 1020   CREATININE 0.72 08/01/2015 0935   CALCIUM 8.8 11/08/2016 1020   GFRNONAA >60 10/26/2016 1454   GFRNONAA 81 09/25/2013 1034   GFRAA >60 10/26/2016 1454   GFRAA >89 09/25/2013 1034    BNP    Component Value Date/Time   BNP 98.4 09/14/2016 1542   BNP 108.1 (H) 11/19/2014 1029    ProBNP    Component Value Date/Time   PROBNP 120.0 (H) 03/12/2010 1105    Imaging: No results found.   Assessment & Plan:   We will switch patient to [place].  Instructed patient on how to take medication, as well as observed patient using inhaler teaching device.  Unfortunately, we do not have any samples in office today.  Patient can finish Spiriva Respimat that she is already purchased, and that we have put in a new order for Incruse Ellipta to go to [facility].  Patient to keep follow-up with Dr. Melvyn Novas next month.  COPD GOLD II Will  switch patient to Incruse Ellipta based off of formulary change today >>> 1 puff daily  Okay for you to finish Spiriva Respimat that you have at home, then start Incruse Ellipta >>> Do not combine these inhalers  Keep follow-up to see Dr. Melvyn Novas next month  If you have any difficulties obtaining these medications, or changes with your breathing, or oxygen needs please follow-up with our office sooner    Chronic respiratory failure with hypoxia and hypercapnia ([place]) Continue oxygen therapy as prescribed Keep follow-up with Dr. Melvyn Novas next month     Lauraine Rinne, NP 06/30/2017

## 2017-06-29 NOTE — Telephone Encounter (Signed)
Ok but should come in to see me or NP for training on the device

## 2017-06-29 NOTE — Telephone Encounter (Signed)
Spoke with pt, she states she received a letter about her Spiriva, stating she would have to pay full price for Spiriva after July. Can we switch pt to Incruse which is the covered alternative. Dr. Melvyn Novas please advise.  [facility].  Patient Instructions by Tanda Rockers, MD at 04/26/2017 4:00 PM  Author: Tanda Rockers, MD Author Type: Physician Filed: 04/26/2017 4:12 PM  Note Status: Signed Cosign: Cosign Not Required Encounter Date: 04/26/2017  Editor: Tanda Rockers, MD (Physician)    If not improving >  Prednisone 10 mg take  4 each am x 2 days,   2 each am x 2 days,  1 each am x 2 days and stop    Follow the med calendar with the new action plan off adding afrin x 5 days a few minutes before your flonase / astelin    See [name] NP in 3 months with all your medications, even over the counter meds, separated in two separate bags, the ones you take no matter what vs the ones you stop once you feel better and take only as needed when you feel you need them.   [name]  will generate for you a new user friendly medication calendar that will put [place] all on the same page re: your medication use.             Instructions   If not improving >  Prednisone 10 mg take  4 each am x 2 days,   2 each am x 2 days,  1 each am x 2 days and stop    Follow the med calendar with the new action plan off adding afrin x 5 days a few minutes before your flonase / astelin    See [name] NP in 3 months with all your medications, even over the counter meds, separated in two separate bags, the ones you take no matter what vs the ones you stop once you feel better and take only as needed when you feel you need them.   [name]  will generate for you a new user friendly medication calendar that will put [place] all on the same page re: your medication use.                 After Visit Summary (Printed 04/26/2017)

## 2017-06-29 NOTE — Telephone Encounter (Signed)
Pt is aware of recs from MW-patient has apt to come in to see Wyn Quaker, NP 06/29/17 at 10:30am. Nothing more needed at this time.

## 2017-06-30 ENCOUNTER — Ambulatory Visit (INDEPENDENT_AMBULATORY_CARE_PROVIDER_SITE_OTHER): Payer: BLUE CROSS/BLUE SHIELD | Admitting: Pulmonary Disease

## 2017-06-30 ENCOUNTER — Encounter: Payer: Self-pay | Admitting: Pulmonary Disease

## 2017-06-30 VITALS — BP 132/74 | HR 68 | Ht 62.0 in | Wt 261.2 lb

## 2017-06-30 DIAGNOSIS — J9612 Chronic respiratory failure with hypercapnia: Secondary | ICD-10-CM | POA: Diagnosis not present

## 2017-06-30 DIAGNOSIS — J9611 Chronic respiratory failure with hypoxia: Secondary | ICD-10-CM

## 2017-06-30 DIAGNOSIS — J449 Chronic obstructive pulmonary disease, unspecified: Secondary | ICD-10-CM | POA: Diagnosis not present

## 2017-06-30 MED ORDER — UMECLIDINIUM BROMIDE 62.5 MCG/INH IN AEPB: 1.0000 | INHALATION_SPRAY | Freq: Every day | RESPIRATORY_TRACT | 6 refills | Status: DC

## 2017-06-30 NOTE — Patient Instructions (Signed)
Will switch patient to Incruse Ellipta based off of formulary change today >>> 1 puff daily  Okay for you to finish Spiriva Respimat that you have at home, then start Incruse Ellipta >>> Do not combine these inhalers  Keep follow-up to see Dr. Melvyn Novas next month  If you have any difficulties obtaining these medications, or changes with your breathing, or oxygen needs please follow-up with our office sooner      Please contact the office if your symptoms worsen or you have concerns that you are not improving.   Thank you for choosing [facility] for your healthcare, and for allowing [place] to partner with you on your healthcare journey. I am thankful to be able to provide care to you today.   Wyn Quaker FNP-C

## 2017-06-30 NOTE — Assessment & Plan Note (Signed)
Will switch patient to Incruse Ellipta based off of formulary change today >>> 1 puff daily  Okay for you to finish Spiriva Respimat that you have at home, then start Incruse Ellipta >>> Do not combine these inhalers  Keep follow-up to see Dr. Melvyn Novas next month  If you have any difficulties obtaining these medications, or changes with your breathing, or oxygen needs please follow-up with our office sooner

## 2017-06-30 NOTE — Assessment & Plan Note (Signed)
Continue oxygen therapy as prescribed Keep follow-up with Dr. Melvyn Novas next month

## 2017-07-01 ENCOUNTER — Ambulatory Visit (INDEPENDENT_AMBULATORY_CARE_PROVIDER_SITE_OTHER): Payer: BLUE CROSS/BLUE SHIELD | Admitting: Allergy

## 2017-07-01 ENCOUNTER — Encounter: Payer: Self-pay | Admitting: Allergy

## 2017-07-01 VITALS — BP 140/74 | HR 90 | Temp 98.3°F | Resp 24 | Ht 62.2 in | Wt 262.0 lb

## 2017-07-01 DIAGNOSIS — J449 Chronic obstructive pulmonary disease, unspecified: Secondary | ICD-10-CM

## 2017-07-01 DIAGNOSIS — J3089 Other allergic rhinitis: Secondary | ICD-10-CM | POA: Diagnosis not present

## 2017-07-01 DIAGNOSIS — K219 Gastro-esophageal reflux disease without esophagitis: Secondary | ICD-10-CM | POA: Diagnosis not present

## 2017-07-01 MED ORDER — MONTELUKAST SODIUM 10 MG PO TABS: 10.0000 mg | ORAL_TABLET | Freq: Every day | ORAL | 3 refills | Status: DC

## 2017-07-01 MED ORDER — IPRATROPIUM BROMIDE 0.06 % NA SOLN: 2.0000 | Freq: Four times a day (QID) | NASAL | 5 refills | Status: DC

## 2017-07-01 MED ORDER — LEVOCETIRIZINE DIHYDROCHLORIDE 5 MG PO TABS: 5.0000 mg | ORAL_TABLET | Freq: Every evening | ORAL | 5 refills | Status: DC

## 2017-07-01 NOTE — Patient Instructions (Addendum)
Allergies     - believe you have allergic rhinitis with post-nasal drainage leading to increased nasal mucus and drainage which can lead to throat clearing and cough    - start nasal atrovent 0.06% use 2 sprays in each nostril and may use up to 3-4 times a day for control of nasal drainage    - if your nose is getting to dry reduce use of nasal atrovent and recommend nasal saline spray to keep nose moisturized    - continue singulair 10mg  daily    - start use of Xyzal 5mg  daily -- this an antihistamine and replaces medications like Zyrtec or Claritin    - you can use your flonase 2 sprays once a day if you are having nasal congestion.  Use for 1-2 weeks at a time before stopping once symptoms improve    - will obtain environmental allergy panel  COPD/chronic respiratory failure and hypoxia    - continue care with [facility]    - continue oxygen therapy as directed    - start Incruse Ellipta as directed -- this is replacing Spiriva  Reflux    - continue Protonix and Pepcid for control of reflux symptoms  Follow-up 4 months or sooner if needed

## 2017-07-01 NOTE — Progress Notes (Signed)
New Patient Note  RE: [name] [name] MRN: [id number] DOB: [birth year] Date of Office Visit: 07/01/2017  Referring provider: Biagio Borg, MD Primary care provider: Biagio Borg, MD  Chief Complaint: nasal drainage  History of present illness: [name] [name] is a 67 y.o. female presenting today for consultation for allergic rhinitis.  She presents today with her daughter.    In April when it got warmer she states she opened up her window and a lot of pollen blew in.  She states she is still cleaning up all pollen from around the house.  She states she is keeping her home clean and trying to keep dust down.  Since this day when she has a lot of pollen exposure she states she has been having increased nasal drainage and does feel it draining down her throat and it is leading to more cough.  She states she is constantly clearing her throat.   She denies any itchy/watery/red eyes and no significant nasal congestion or sneezing symptoms.   She states she has Astelin at home and does feel like she has taken in since April and is not sure if it was helpful but she has not been taking this consistently.  She also does not take any antihistamines at this time.  She was started on Singulair within the past several days by either her PCP or pulmonologist.   She also has flonase but has not been using this spray either.   She has never had any allergy testing done before.   She states she does take pepcid and protonix daily with good reflux control.    She does follow with [facility] for her COPD gold stage 2. She saw NP Mack yesterday at which time her spiriva was changed to Incruse as her mail-in pharmacy "does not want to fill" her spiriva.  She is on chronic oxygen therapy and uses 2.5L while sitting and 4L when walking/active.  She has f/u with Dr. Melvyn Novas next month.     She has not history of eczema or food allergy.   Review of systems: Review of Systems  Constitutional: Positive for  malaise/fatigue. Negative for chills, fever and weight loss.  HENT: Positive for congestion. Negative for ear discharge, nosebleeds and sore throat.   Eyes: Negative for pain, discharge and redness.  Respiratory: Positive for cough and shortness of breath.   Cardiovascular: Negative for chest pain.  Gastrointestinal: Negative for abdominal pain, constipation, diarrhea, heartburn, nausea and vomiting.  Musculoskeletal: Negative for joint pain.  Skin: Negative for itching and rash.  Neurological: Positive for tremors. Negative for headaches.    All other systems negative unless noted above in HPI  Past medical history: Past Medical History:  Diagnosis Date  . Anemia    hx  . Anxiety   . Arthritis   . Asthma   . Atrial fibrillation and flutter ([place])   . Atypical lobular hyperplasia of right breast 05/09/2015  . Breast CA ([place])    ?  Marland Kitchen Chronic rhinitis   . COPD (chronic obstructive pulmonary disease) ([place])    [facility]. PFTs 07/25/06 FEV1 55% ratio 53, DLC0 51% HFA 50% 12/16/2009  . Depression   . FRACTURE, RIB, RIGHT 06/18/2009  . Glucose intolerance (impaired glucose tolerance)    on steroids  . History of DVT (deep vein thrombosis)   . Hx of cardiac catheterization    LHC (01/2001):  Normal Cors.  EF 65%.;  LexiScan Myoview (03/2013): No ischemia, EF  61%, normal  . Hx of echocardiogram    Echo (11/2011):  Mod LVH, EF 60-65%, no RWMA, MAC, mild LAE, PASP 34.  Marland Kitchen HYPERLIPIDEMIA   . Hypertension   . Hypothyroidism   . Morbid obesity ([place])    target wt=179lb for BMI<30 Peak wt 282lb  . Osteoporosis    last BMD 4/08 -2.4, intolerant of bisphosphonates  . Peripheral vascular disease (HCC)    hx dvt.pe  . Pneumonia    hx  . PONV (postoperative nausea and vomiting)   . PREMATURE VENTRICULAR CONTRACTIONS   . PULMONARY EMBOLISM, HX OF 06/19/2007  . Recurrent upper respiratory infection (URI)   . Rhabdomyolysis 07/29/2009  . Tremor   . VITAMIN D DEFICIENCY     Past surgical  history: Past Surgical History:  Procedure Laterality Date  . A-FLUTTER ABLATION N/A 10/08/2016   Procedure: A-FLUTTER ABLATION;  Surgeon: Evans Lance, MD;  Location: [facility];  Service: Cardiovascular;  Laterality: N/A;  . ABDOMINAL HYSTERECTOMY    . BREAST LUMPECTOMY WITH RADIOACTIVE SEED LOCALIZATION Right 06/05/2015   Procedure: RIGHT BREAST LUMPECTOMY WITH RADIOACTIVE SEED LOCALIZATION;  Surgeon: Fanny Skates, MD;  Location: [facility];  Service: General;  Laterality: Right;  . BREAST SURGERY Left    s/p mass removal  . CARDIOVERSION N/A 12/11/2014   Procedure: CARDIOVERSION;  Surgeon: Josue Hector, MD;  Location: [facility];  Service: Cardiovascular;  Laterality: N/A;  . CARDIOVERSION N/A 09/15/2016   Procedure: CARDIOVERSION;  Surgeon: Jerline Pain, MD;  Location: [facility];  Service: Cardiovascular;  Laterality: N/A;  . COLONOSCOPY  10/22/2003, ?2006  . COLONOSCOPY WITH PROPOFOL N/A 01/15/2016   Procedure: COLONOSCOPY WITH PROPOFOL;  Surgeon: Gatha Mayer, MD;  Location: [facility];  Service: Endoscopy;  Laterality: N/A;  . ESOPHAGOGASTRODUODENOSCOPY (EGD) WITH PROPOFOL N/A 01/15/2016   Procedure: ESOPHAGOGASTRODUODENOSCOPY (EGD) WITH PROPOFOL;  Surgeon: Gatha Mayer, MD;  Location: [facility];  Service: Endoscopy;  Laterality: N/A;  . s/p left arm fracture with fall off chair    . skin graft to middle R finger  1975  . TEE WITHOUT CARDIOVERSION N/A 12/11/2014   Procedure: TRANSESOPHAGEAL ECHOCARDIOGRAM (TEE);  Surgeon: Josue Hector, MD;  Location: [facility] ENDOSCOPY;  Service: Cardiovascular;  Laterality: N/A;  . TONSILLECTOMY      Family history:  Family History  Problem Relation Age of Onset  . Heart disease Mother   . Liver disease Mother        alcohol related  . Asthma Brother   . Esophageal cancer Brother   . Asthma Son   . Colon cancer Neg Hx   . Stomach cancer Neg Hx   . Pancreatic cancer Neg Hx   . Inflammatory bowel disease Neg Hx   . Allergic  rhinitis Neg Hx   . Eczema Neg Hx   . Urticaria Neg Hx     Social history: Lives in a home with carpeting with electric heating and central cooling.  No pets in the home.  Chickens outside the home.  No concern for water damage, mildew or roaches in the home.   Occupational History  . 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 Smoking status: Former Smoker    Packs/day: 1.00    Years: 35.00    Pack years: 35.00    Types: Cigarettes    Last attempt to quit: 01/11/2006  . Smokeless tobacco: Never Used    Medication List: Allergies as of  07/01/2017      Reactions   Duloxetine Other (See Comments)   REACTION: rhabdomyolysis   Penicillins Other (See Comments)   SYNCOPE Has patient had a PCN reaction causing immediate rash, facial/tongue/throat swelling, SOB or lightheadedness with hypotension: Yes Has patient had a PCN reaction causing severe rash involving mucus membranes or skin necrosis: No Has patient had a PCN reaction that required hospitalization No Has patient had a PCN reaction occurring within the last 10 years: No If all of the above answers are "NO", then may proceed with Cephalosporin use.   Latex Rash      Medication List        Accurate as of 07/01/17  4:28 PM. Always use your most recent med list.          apixaban 5 MG Tabs tablet Commonly known as:  ELIQUIS Take 1 tablet (5 mg total) by mouth 2 (two) times daily.   atorvastatin 10 MG tablet Commonly known as:  LIPITOR Take 1 tablet (10 mg total) by mouth daily.   azelastine 0.1 % nasal spray Commonly known as:  ASTELIN Place 2 sprays at bedtime as needed into both nostrils for rhinitis. Use in each nostril as directed   budesonide-formoterol 160-4.5 MCG/ACT inhaler Commonly known as:  SYMBICORT Inhale 2 puffs 2 (two) times daily into the lungs.   Calcium Carbonate-Vitamin D 600-400 MG-UNIT tablet Take 1 tablet by mouth 2 (two) times daily.    cetirizine 10 MG tablet Commonly known as:  ZYRTEC Take 10 mg daily as needed by mouth for allergies.   famotidine 20 MG tablet Commonly known as:  PEPCID One at bedtime   ferrous sulfate 325 (65 FE) MG tablet Take 325 mg by mouth 2 (two) times daily with a meal.   FLUoxetine 40 MG capsule Commonly known as:  PROZAC TAKE 1 CAPSULE DAILY   furosemide 80 MG tablet Commonly known as:  LASIX TAKE ONE AND ONE-HALF TABLETS IN THE MORNING AND 1 TABLET IN THE AFTERNOON   glucosamine-chondroitin 500-400 MG tablet Take 1 tablet by mouth every morning.   ipratropium 0.06 % nasal spray Commonly known as:  ATROVENT Place 2 sprays into both nostrils 4 (four) times daily.   KLOR-CON M20 20 MEQ tablet Generic drug:  potassium chloride SA Take 1 tablet (20 mEq total) by mouth 2 (two) times daily.   levalbuterol 1.25 MG/3ML nebulizer solution Commonly known as:  XOPENEX Take 1.25 mg by nebulization every 4 (four) hours.   levocetirizine 5 MG tablet Commonly known as:  XYZAL Take 1 tablet (5 mg total) by mouth every evening.   metoprolol tartrate 25 MG tablet Commonly known as:  LOPRESSOR Take 1 tablet (25 mg total) by mouth 2 (two) times daily.   montelukast 10 MG tablet Commonly known as:  SINGULAIR Take 1 tablet (10 mg total) by mouth daily.   MUCINEX DM MAXIMUM STRENGTH 60-1200 MG Tb12 Take 1 tablet by mouth every 12 (twelve) hours as needed (cough/congestion). With flutter   naproxen sodium 220 MG tablet Commonly known as:  ALEVE Take 220 mg by mouth.   OXYGEN Inhale 2.5 L/min into the lungs continuous. And 4 with exertion   pantoprazole 40 MG tablet Commonly known as:  PROTONIX Take 1 tablet (40 mg total) by mouth daily. Take 30-60 min before first meal of the day   PATADAY 0.2 % Soln Generic drug:  Olopatadine HCl Place 1 drop into both eyes daily.   PROAIR HFA 108 (90 Base) MCG/ACT inhaler  Generic drug:  albuterol Inhale 2 puffs into the lungs every 6 (six)  hours as needed for wheezing or shortness of breath.   propafenone 325 MG 12 hr capsule Commonly known as:  RYTHMOL SR Take 1 capsule (325 mg total) by mouth 2 (two) times daily.   sodium chloride 0.65 % Soln nasal spray Commonly known as:  OCEAN Place 2 sprays every 4 (four) hours as needed into both nostrils for congestion.   Tiotropium Bromide Monohydrate 1.25 MCG/ACT Aers Commonly known as:  SPIRIVA RESPIMAT Inhale 2 puffs daily into the lungs.   umeclidinium bromide 62.5 MCG/INH Aepb Commonly known as:  INCRUSE ELLIPTA Inhale 1 puff into the lungs daily.       Known medication allergies: Allergies  Allergen Reactions  . Duloxetine Other (See Comments)    REACTION: rhabdomyolysis  . Penicillins Other (See Comments)    SYNCOPE Has patient had a PCN reaction causing immediate rash, facial/tongue/throat swelling, SOB or lightheadedness with hypotension: Yes Has patient had a PCN reaction causing severe rash involving mucus membranes or skin necrosis: No Has patient had a PCN reaction that required hospitalization No Has patient had a PCN reaction occurring within the last 10 years: No If all of the above answers are "NO", then may proceed with Cephalosporin use.   . Latex Rash     Physical examination: Blood pressure 140/74, pulse 90, temperature 98.3 F (36.8 C), temperature source Oral, resp. rate (!) 24, height 5' 2.2" (1.58 m), weight 262 lb (118.8 kg), SpO2 94 %.  General: Alert, interactive, in no acute distress, obese, [place] in place. HEENT: PERRLA, TMs pearly gray, turbinates mildly edematous with clear discharge, post-pharynx non erythematous, mild cobblestoning. Neck: Supple without lymphadenopathy. Lungs: Mildly decreased breath sounds bilaterally without wheezing, rhonchi or rales. {no increased work of breathing. CV: Normal S1, S2 without murmurs. Abdomen: Nondistended, nontender. Skin: Warm and dry, without lesions or rashes. Extremities:  No clubbing,  cyanosis or edema. Neuro:   Grossly intact.  Diagnositics/Labs: None today  Assessment and plan:   Allergic rhinitis    - believe you have allergic rhinitis with post-nasal drainage leading to increased nasal mucus and drainage which can lead to throat clearing and cough    - start nasal atrovent 0.06% use 2 sprays in each nostril and may use up to 3-4 times a day for control of nasal drainage    - if your nose is getting to dry reduce use of nasal atrovent and recommend nasal saline spray to keep nose moisturized    - continue singulair 50m daily    - start use of Xyzal 566mdaily -- this an antihistamine and replaces medications like Zyrtec or Claritin    - you can use your flonase 2 sprays once a day if you are having nasal congestion.  Use for 1-2 weeks at a time before stopping once symptoms improve    - will obtain environmental allergy panel  COPD/chronic respiratory failure and hypoxia    - continue care with [facility]    - continue oxygen therapy as directed    - start Incruse Ellipta as directed -- this is replacing Spiriva  Reflux    - continue Protonix and Pepcid for control of reflux symptoms  Follow-up 4 months or sooner if needed  I appreciate the opportunity to take part in Joely's care. Please do not hesitate to contact me with questions.  Sincerely,   ShPrudy FeelerMD Allergy/Immunology [facility] [place]

## 2017-07-02 ENCOUNTER — Other Ambulatory Visit: Payer: Self-pay | Admitting: Internal Medicine

## 2017-07-06 LAB — ALLERGENS, ZONE 2

## 2017-07-08 ENCOUNTER — Other Ambulatory Visit: Payer: Self-pay | Admitting: *Deleted

## 2017-07-18 ENCOUNTER — Telehealth: Payer: Self-pay | Admitting: Internal Medicine

## 2017-07-18 MED ORDER — BUDESONIDE-FORMOTEROL FUMARATE 160-4.5 MCG/ACT IN AERO: 2.0000 | INHALATION_SPRAY | Freq: Two times a day (BID) | RESPIRATORY_TRACT | 2 refills | Status: DC

## 2017-07-18 NOTE — Telephone Encounter (Signed)
Pt requesting a 90 day supply of Symbicort to [facility].  This has been sent as requested.  Nothing further needed.

## 2017-07-21 ENCOUNTER — Ambulatory Visit: Payer: Self-pay | Admitting: Allergy

## 2017-07-27 ENCOUNTER — Encounter: Payer: BLUE CROSS/BLUE SHIELD | Admitting: Adult Health

## 2017-07-28 ENCOUNTER — Ambulatory Visit (INDEPENDENT_AMBULATORY_CARE_PROVIDER_SITE_OTHER): Payer: BLUE CROSS/BLUE SHIELD | Admitting: Adult Health

## 2017-07-28 ENCOUNTER — Encounter: Payer: Self-pay | Admitting: Adult Health

## 2017-07-28 DIAGNOSIS — J9612 Chronic respiratory failure with hypercapnia: Secondary | ICD-10-CM | POA: Diagnosis not present

## 2017-07-28 DIAGNOSIS — J449 Chronic obstructive pulmonary disease, unspecified: Secondary | ICD-10-CM

## 2017-07-28 DIAGNOSIS — J9611 Chronic respiratory failure with hypoxia: Secondary | ICD-10-CM

## 2017-07-28 MED ORDER — METOPROLOL TARTRATE 25 MG PO TABS: 25.0000 mg | ORAL_TABLET | Freq: Two times a day (BID) | ORAL | 0 refills | Status: DC

## 2017-07-28 NOTE — Progress Notes (Signed)
@Patient  ID: [name] [name], female    DOB: 14-Dec-1950, 67 y.o.   MRN: [id number]  Chief Complaint  Patient presents with  . Follow-up    COPD     Referring provider: Biagio Borg, MD  HPI: 32 yowf last smoked 2003 with Morbid obesity/ COPD GOLD II  with minimum asthmatic component documented 07/25/06. On 02 24 hours per day at 3lpm   07/28/2017 Follow up ; COPD and med review  Patient presents for a 71-monthfollow-up.  Patient has underlying COPD.  That is oxygen dependent.  Patient says overall breathing is doing about the same.  She does get short of breath with heavy activity.  She denies any flare of cough or wheezing.  She remains on oxygen.  She says she is doing well.  We reviewed all her medications organize them into a medication calendar.  Patient appears to be taking her medications correctly.  She does need a refill of metoprolol.  Until she can see her cardiologist.   Allergies  Allergen Reactions  . Duloxetine Other (See Comments)    REACTION: rhabdomyolysis  . Penicillins Other (See Comments)    SYNCOPE Has patient had a PCN reaction causing immediate rash, facial/tongue/throat swelling, SOB or lightheadedness with hypotension: Yes Has patient had a PCN reaction causing severe rash involving mucus membranes or skin necrosis: No Has patient had a PCN reaction that required hospitalization No Has patient had a PCN reaction occurring within the last 10 years: No If all of the above answers are "NO", then may proceed with Cephalosporin use.   . Latex Rash    Immunization History  Administered Date(s) Administered  . Influenza Split 10/14/2010, 10/13/2011  . Influenza Whole 10/02/2007, 10/14/2008, 10/16/2009  . Influenza, High Dose Seasonal PF 10/09/2016  . Influenza, Seasonal, Injecte, Preservative Fre 11/01/2013  . Influenza,inj,Quad PF,6+ Mos 10/17/2012, 10/17/2015  . Pneumococcal Conjugate-13 10/17/2012, 01/03/2013  . Pneumococcal Polysaccharide-23  09/12/2006, 07/27/2016  . Td 12/05/2007  . Zoster 10/14/2010    Past Medical History:  Diagnosis Date  . Anemia    hx  . Anxiety   . Arthritis   . Asthma   . Atrial fibrillation and flutter ([facility]   . Atypical lobular hyperplasia of right breast 05/09/2015  . Breast CA ([facility]    ?  .Marland KitchenChronic rhinitis   . COPD [place] pulmonary disease) ([place]    [facility]. PFTs 07/25/06 FEV1 55% ratio 53, DLC0 51% HFA 50% 12/16/2009  . Depression   . FRACTURE, RIB, RIGHT 06/18/2009  . Glucose intolerance (impaired glucose tolerance)    on steroids  . History of DVT (deep vein thrombosis)   . Hx of cardiac catheterization    LHC (01/2001):  Normal Cors.  EF 65%.;  LexiScan Myoview (03/2013): No ischemia, EF 61%, normal  . Hx of echocardiogram    Echo (11/2011):  Mod LVH, EF 60-65%, no RWMA, MAC, mild LAE, PASP 34.  .Marland KitchenHYPERLIPIDEMIA   . Hypertension   . Hypothyroidism   . Morbid obesity ([facility]    target wt=179lb for BMI<30 Peak wt 282lb  . Osteoporosis    last BMD 4/08 -2.4, intolerant of bisphosphonates  . Peripheral vascular disease (HCC)    hx dvt.pe  . Pneumonia    hx  . PONV (postoperative nausea and vomiting)   . PREMATURE VENTRICULAR CONTRACTIONS   . PULMONARY EMBOLISM, HX OF 06/19/2007  . Recurrent upper respiratory infection (URI)   . Rhabdomyolysis 07/29/2009  . Tremor   .  VITAMIN D DEFICIENCY     Tobacco History: Social History   Tobacco Use  Smoking Status Former Smoker  . Packs/day: 1.00  . Years: 35.00  . Pack years: 35.00  . Types: Cigarettes  . Last attempt to quit: 01/11/2006  . Years since quitting: 11.5  Smokeless Tobacco Never Used   Counseling given: Not Answered   Outpatient Medications Prior to Visit  Medication Sig Dispense Refill  . albuterol (PROAIR HFA) 108 (90 Base) MCG/ACT inhaler Inhale 2 puffs into the lungs every 6 (six) hours as needed for wheezing or shortness of breath.    Marland Kitchen apixaban (ELIQUIS) 5 MG TABS tablet Take 1 tablet (5 mg total) by  mouth 2 (two) times daily. 180 tablet 1  . atorvastatin (LIPITOR) 10 MG tablet TAKE 1 TABLET DAILY 90 tablet 2  . azelastine (ASTELIN) 0.1 % nasal spray Place 2 sprays at bedtime as needed into both nostrils for rhinitis. Use in each nostril as directed    . budesonide-formoterol (SYMBICORT) 160-4.5 MCG/ACT inhaler Inhale 2 puffs into the lungs 2 (two) times daily. 3 Inhaler 2  . Calcium Carbonate-Vitamin D 600-400 MG-UNIT per tablet Take 1 tablet by mouth 2 (two) times daily.      . cetirizine (ZYRTEC) 10 MG tablet Take 10 mg daily as needed by mouth for allergies.    . Dextromethorphan-Guaifenesin (MUCINEX DM MAXIMUM STRENGTH) 60-1200 MG TB12 Take 1 tablet by mouth every 12 (twelve) hours as needed (cough/congestion). With flutter    . famotidine (PEPCID) 20 MG tablet One at bedtime 90 tablet 3  . ferrous sulfate 325 (65 FE) MG tablet Take 325 mg by mouth 2 (two) times daily with a meal.     . FLUoxetine (PROZAC) 40 MG capsule TAKE 1 CAPSULE DAILY 90 capsule 3  . furosemide (LASIX) 80 MG tablet TAKE ONE AND ONE-HALF TABLETS IN THE MORNING AND 1 TABLET IN THE AFTERNOON 225 tablet 1  . glucosamine-chondroitin 500-400 MG tablet Take 1 tablet by mouth every morning.     Marland Kitchen ipratropium (ATROVENT) 0.06 % nasal spray Place 2 sprays into both nostrils 4 (four) times daily. 15 mL 5  . KLOR-CON M20 20 MEQ tablet Take 1 tablet (20 mEq total) by mouth 2 (two) times daily. 180 tablet 2  . levalbuterol (XOPENEX) 1.25 MG/3ML nebulizer solution Take 1.25 mg by nebulization every 4 (four) hours. 360 mL 11  . levocetirizine (XYZAL) 5 MG tablet Take 1 tablet (5 mg total) by mouth every evening. 30 tablet 5  . metoprolol tartrate (LOPRESSOR) 25 MG tablet Take 1 tablet (25 mg total) by mouth 2 (two) times daily. 60 tablet 0  . montelukast (SINGULAIR) 10 MG tablet Take 1 tablet (10 mg total) by mouth daily. 90 tablet 3  . naproxen sodium (ALEVE) 220 MG tablet Take 220 mg by mouth.    . Olopatadine HCl (PATADAY) 0.2 %  SOLN Place 1 drop into both eyes daily.    . OXYGEN Inhale 2.5 L/min into the lungs continuous. And 4 with exertion    . pantoprazole (PROTONIX) 40 MG tablet Take 1 tablet (40 mg total) by mouth daily. Take 30-60 min before first meal of the day 90 tablet 3  . propafenone (RYTHMOL SR) 325 MG 12 hr capsule Take 1 capsule (325 mg total) by mouth 2 (two) times daily. 180 capsule 3  . sodium chloride (OCEAN) 0.65 % SOLN nasal spray Place 2 sprays every 4 (four) hours as needed into both nostrils for congestion.    Marland Kitchen  Tiotropium Bromide Monohydrate (SPIRIVA RESPIMAT) 1.25 MCG/ACT AERS Inhale 2 puffs daily into the lungs. (Patient not taking: Reported on 07/28/2017) 1 Inhaler 3  . umeclidinium bromide (INCRUSE ELLIPTA) 62.5 MCG/INH AEPB Inhale 1 puff into the lungs daily. 1 each 6   No facility-administered medications prior to visit.      Review of Systems  Constitutional:   No  weight loss, night sweats,  Fevers, chills,  +fatigue, or  lassitude.  HEENT:   No headaches,  Difficulty swallowing,  Tooth/dental problems, or  Sore throat,                No sneezing, itching, ear ache,  +nasal congestion, post nasal drip,   CV:  No chest pain,  Orthopnea, PND, swelling in lower extremities, anasarca, dizziness, palpitations, syncope.   GI  No heartburn, indigestion, abdominal pain, nausea, vomiting, diarrhea, change in bowel habits, loss of appetite, bloody stools.   Resp:    No chest wall deformity  Skin: no rash or lesions.  GU: no dysuria, change in color of urine, no urgency or frequency.  No flank pain, no hematuria   MS:  No joint pain or swelling.  No decreased range of motion.  No back pain.    Physical Exam  BP 134/68 (BP Location: Right Arm, Cuff Size: Normal)   Pulse 76   Ht 5' 2"  (1.575 m)   Wt 264 lb (119.7 kg)   SpO2 96%   BMI 48.29 kg/m   GEN: A/Ox3; pleasant , NAD, obese    HEENT:  [place]/AT,  EACs-clear, TMs-wnl, NOSE-clear, THROAT-clear, no lesions, no postnasal drip  or exudate noted.   NECK:  Supple w/ fair ROM; no JVD; normal carotid impulses w/o bruits; no thyromegaly or nodules palpated; no lymphadenopathy.    RESP  Clear  P & A; w/o, wheezes/ rales/ or rhonchi. no accessory muscle use, no dullness to percussion  CARD:  RRR, no m/r/g, tr  peripheral edema, pulses intact, no cyanosis or clubbing.  GI:   Soft & nt; nml bowel sounds; no organomegaly or masses detected.   Musco: Warm bil, no deformities or joint swelling noted.   Neuro: alert, no focal deficits noted.    Skin: Warm, no lesions or rashes    Lab Results:  CBCBMET   BNP  Imaging: No results found.   Assessment & Plan:   COPD GOLD II Currently controlled on current regimen  Patient's medications were reviewed today and patient education was given. Computerized medication calendar was adjusted/completed   Plan  Patient Instructions  Continue on current regimen .  Follow  med calendar closely and bring to each visit.  Follow up Dr. Melvyn Novas  In 2-3 months and As needed   Please contact office for sooner follow up if symptoms do not improve or worsen or seek emergency care.          Chronic respiratory failure with hypoxia and hypercapnia (HCC) Cont on O2      [name] Parrett, NP 07/28/2017

## 2017-07-28 NOTE — Progress Notes (Signed)
Chart and office note reviewed in detail  > agree with a/p as outlined    

## 2017-07-28 NOTE — Assessment & Plan Note (Signed)
Cont on O2 .  

## 2017-07-28 NOTE — Assessment & Plan Note (Signed)
Currently controlled on current regimen  Patient's medications were reviewed today and patient education was given. Computerized medication calendar was adjusted/completed   Plan  Patient Instructions  Continue on current regimen .  Follow  med calendar closely and bring to each visit.  Follow up Dr. Melvyn Novas  In 2-3 months and As needed   Please contact office for sooner follow up if symptoms do not improve or worsen or seek emergency care.

## 2017-07-28 NOTE — Patient Instructions (Signed)
Continue on current regimen .  Follow  med calendar closely and bring to each visit.  Follow up Dr. Melvyn Novas  In 2-3 months and As needed   Please contact office for sooner follow up if symptoms do not improve or worsen or seek emergency care.

## 2017-07-28 NOTE — Addendum Note (Signed)
Addended by: Parke Poisson E on: 07/28/2017 05:26 PM   Modules accepted: Orders

## 2017-07-29 NOTE — Addendum Note (Signed)
Addended by: Valerie Salts on: 07/29/2017 12:32 PM   Modules accepted: Orders

## 2017-08-15 ENCOUNTER — Telehealth: Payer: Self-pay | Admitting: Internal Medicine

## 2017-08-15 NOTE — Telephone Encounter (Signed)
Levothyroxine 72mcg refill Last Refilled by historical provider Last OV: 05/13/17 PCP: Dr. Jenny Reichmann Pharmacy: [facility]

## 2017-08-15 NOTE — Telephone Encounter (Signed)
Copied from [place] [id number]. Topic: Quick Communication - Rx Refill/Question >> Aug 15, 2017 12:10 PM Oliver Pila B wrote: Medication: levothyroxine (SYNTHROID, LEVOTHROID) 25 MCG tablet [[id number]]    Has the patient contacted their pharmacy? Yes.   (Agent: If no, request that the patient contact the pharmacy for the refill.) (Agent: If yes, when and what did the pharmacy advise?)  Preferred Pharmacy (with phone number [facility]   Agent: Please be advised that RX refills may take up to 3 business days. We ask that you follow-up with your pharmacy.

## 2017-08-16 MED ORDER — LEVOTHYROXINE SODIUM 25 MCG PO TABS: 25.0000 ug | ORAL_TABLET | Freq: Every day | ORAL | 1 refills | Status: DC

## 2017-08-25 ENCOUNTER — Other Ambulatory Visit: Payer: Self-pay | Admitting: Adult Health

## 2017-08-25 NOTE — Telephone Encounter (Signed)
Received faxed refill request from [facility] requesting 90 day supply on patient's Metoprolol 25mg  Patient last seen 6.20.19 and refill given as courtesy until patient could see cardiology  Will deny refill and defer to Dr Stanford Breed - faxed back to [facility] Will sign off

## 2017-08-29 ENCOUNTER — Telehealth: Payer: Self-pay

## 2017-08-29 MED ORDER — HYDROCODONE-HOMATROPINE 5-1.5 MG/5ML PO SYRP: 5.0000 mL | ORAL_SOLUTION | Freq: Four times a day (QID) | ORAL | 0 refills | Status: AC | PRN

## 2017-08-29 NOTE — Telephone Encounter (Signed)
Please advise.   Copied from [place] [id number]. Topic: General - Other >> Aug 29, 2017 11:32 AM Judyann Munson wrote: Reason for CRM:  Patient is calling to request something to be called in regards to her cough. She stated she was given something a few months ago and is unsure of the name.  Best contact number  [phone]. Please advise

## 2017-08-29 NOTE — Addendum Note (Signed)
Addended by: Biagio Borg on: 08/29/2017 01:01 PM   Modules accepted: Orders

## 2017-08-29 NOTE — Telephone Encounter (Signed)
I think she means the hycodaon - I refilled - done erx

## 2017-09-07 ENCOUNTER — Other Ambulatory Visit: Payer: Self-pay | Admitting: Adult Health

## 2017-09-11 ENCOUNTER — Other Ambulatory Visit: Payer: Self-pay | Admitting: Cardiology

## 2017-09-15 ENCOUNTER — Telehealth: Payer: Self-pay | Admitting: Internal Medicine

## 2017-09-15 MED ORDER — METOPROLOL TARTRATE 25 MG PO TABS: 25.0000 mg | ORAL_TABLET | Freq: Two times a day (BID) | ORAL | 0 refills | Status: DC

## 2017-09-15 NOTE — Telephone Encounter (Signed)
I called pt to see if she was requesting a refill and she states she does need a [facility]. [facility]. Pt understood and nothing further is needed.

## 2017-09-17 ENCOUNTER — Other Ambulatory Visit: Payer: Self-pay | Admitting: Internal Medicine

## 2017-10-03 DIAGNOSIS — H25812 Combined forms of age-related cataract, left eye: Secondary | ICD-10-CM | POA: Diagnosis not present

## 2017-10-07 ENCOUNTER — Other Ambulatory Visit: Payer: Self-pay

## 2017-10-07 ENCOUNTER — Encounter (HOSPITAL_COMMUNITY): Payer: Self-pay | Admitting: *Deleted

## 2017-10-07 ENCOUNTER — Ambulatory Visit: Payer: Self-pay | Admitting: Internal Medicine

## 2017-10-07 ENCOUNTER — Emergency Department (HOSPITAL_COMMUNITY): Payer: BLUE CROSS/BLUE SHIELD

## 2017-10-07 ENCOUNTER — Emergency Department (HOSPITAL_COMMUNITY)
Admission: EM | Admit: 2017-10-07 | Discharge: 2017-10-07 | Disposition: A | Discharge status: Home / Self Care | Payer: BLUE CROSS/BLUE SHIELD | Attending: Emergency Medicine | Admitting: Emergency Medicine

## 2017-10-07 DIAGNOSIS — Z79899 Other long term (current) drug therapy: Secondary | ICD-10-CM | POA: Insufficient documentation

## 2017-10-07 DIAGNOSIS — E039 Hypothyroidism, unspecified: Secondary | ICD-10-CM | POA: Insufficient documentation

## 2017-10-07 DIAGNOSIS — R197 Diarrhea, unspecified: Secondary | ICD-10-CM | POA: Diagnosis not present

## 2017-10-07 DIAGNOSIS — Z87891 Personal history of nicotine dependence: Secondary | ICD-10-CM | POA: Insufficient documentation

## 2017-10-07 DIAGNOSIS — I110 Hypertensive heart disease with heart failure: Secondary | ICD-10-CM | POA: Diagnosis not present

## 2017-10-07 DIAGNOSIS — J449 Chronic obstructive pulmonary disease, unspecified: Secondary | ICD-10-CM | POA: Insufficient documentation

## 2017-10-07 DIAGNOSIS — I5032 Chronic diastolic (congestive) heart failure: Secondary | ICD-10-CM | POA: Insufficient documentation

## 2017-10-07 DIAGNOSIS — R1030 Lower abdominal pain, unspecified: Secondary | ICD-10-CM | POA: Diagnosis present

## 2017-10-07 DIAGNOSIS — Z7901 Long term (current) use of anticoagulants: Secondary | ICD-10-CM | POA: Diagnosis not present

## 2017-10-07 DIAGNOSIS — Z91040 Latex allergy status: Secondary | ICD-10-CM | POA: Insufficient documentation

## 2017-10-07 DIAGNOSIS — R112 Nausea with vomiting, unspecified: Secondary | ICD-10-CM

## 2017-10-07 DIAGNOSIS — N390 Urinary tract infection, site not specified: Secondary | ICD-10-CM

## 2017-10-07 LAB — URINALYSIS, ROUTINE W REFLEX MICROSCOPIC
Bilirubin Urine: NEGATIVE
Glucose, UA: NEGATIVE mg/dL
Hgb urine dipstick: NEGATIVE
Ketones, ur: 5 mg/dL — AB
Nitrite: NEGATIVE
Protein, ur: NEGATIVE mg/dL
Specific Gravity, Urine: >1.046 — ABNORMAL HIGH (ref 1.005–1.030)
pH: 5 (ref 5.0–8.0)

## 2017-10-07 LAB — COMPREHENSIVE METABOLIC PANEL
ALT: 13 U/L (ref 0–44)
AST: 16 U/L (ref 15–41)
Albumin: 4.3 g/dL (ref 3.5–5.0)
Alkaline Phosphatase: 71 U/L (ref 38–126)
Anion gap: 12 (ref 5–15)
BUN: 12 mg/dL (ref 8–23)
CO2: 31 mmol/L (ref 22–32)
Calcium: 9.2 mg/dL (ref 8.9–10.3)
Chloride: 103 mmol/L (ref 98–111)
Creatinine, Ser: 0.76 mg/dL (ref 0.44–1.00)
GFR calc Af Amer: >60 mL/min (ref >60)
GFR calc non Af Amer: >60 mL/min (ref >60)
Glucose, Bld: 130 mg/dL — ABNORMAL HIGH (ref 70–99)
Potassium: 3.7 mmol/L (ref 3.5–5.1)
Sodium: 146 mmol/L — ABNORMAL HIGH (ref 135–145)
Total Bilirubin: 1.7 mg/dL — ABNORMAL HIGH (ref 0.3–1.2)
Total Protein: 7.4 g/dL (ref 6.5–8.1)

## 2017-10-07 LAB — CBC
HCT: 35 % — ABNORMAL LOW (ref 36.0–46.0)
Hemoglobin: 11.6 g/dL — ABNORMAL LOW (ref 12.0–15.0)
MCH: 29.5 pg (ref 26.0–34.0)
MCHC: 33.1 g/dL (ref 30.0–36.0)
MCV: 89.1 fL (ref 78.0–100.0)
Platelets: 228 10*3/uL (ref 150–400)
RBC: 3.93 MIL/uL (ref 3.87–5.11)
RDW: 15.2 % (ref 11.5–15.5)
WBC: 6 10*3/uL (ref 4.0–10.5)

## 2017-10-07 LAB — LIPASE, BLOOD: Lipase: 33 U/L (ref 11–51)

## 2017-10-07 MED ORDER — CIPROFLOXACIN HCL 500 MG PO TABS: 500.0000 mg | ORAL_TABLET | Freq: Two times a day (BID) | ORAL | 0 refills | Status: DC

## 2017-10-07 MED ORDER — ONDANSETRON HCL 4 MG/2ML IJ SOLN
4.0000 mg | Freq: Once | INTRAMUSCULAR | Status: AC
  Administered 2017-10-07: 4 mg via INTRAVENOUS
  Filled 2017-10-07: qty 2

## 2017-10-07 MED ORDER — CIPROFLOXACIN IN D5W 400 MG/200ML IV SOLN: 400.0000 mg | Freq: Once | INTRAVENOUS | Status: DC

## 2017-10-07 MED ORDER — IOPAMIDOL (ISOVUE-300) INJECTION 61%
100.0000 mL | Freq: Once | INTRAVENOUS | Status: AC | PRN
  Administered 2017-10-07: 100 mL via INTRAVENOUS

## 2017-10-07 MED ORDER — LACTATED RINGERS IV BOLUS
500.0000 mL | Freq: Once | INTRAVENOUS | Status: AC
  Administered 2017-10-07: 500 mL via INTRAVENOUS

## 2017-10-07 MED ORDER — ONDANSETRON HCL 4 MG PO TABS: 4.0000 mg | ORAL_TABLET | Freq: Three times a day (TID) | ORAL | 0 refills | Status: DC | PRN

## 2017-10-07 MED ORDER — HYDROMORPHONE HCL 1 MG/ML IJ SOLN
0.7500 mg | Freq: Once | INTRAMUSCULAR | Status: AC
  Administered 2017-10-07: 0.75 mg via INTRAVENOUS
  Filled 2017-10-07: qty 1

## 2017-10-07 MED ORDER — IOPAMIDOL (ISOVUE-300) INJECTION 61%
INTRAVENOUS | Status: AC
  Filled 2017-10-07: qty 100

## 2017-10-07 MED ORDER — CIPROFLOXACIN HCL 500 MG PO TABS
500.0000 mg | ORAL_TABLET | Freq: Once | ORAL | Status: AC
  Administered 2017-10-07: 500 mg via ORAL
  Filled 2017-10-07: qty 1

## 2017-10-07 MED ORDER — SODIUM CHLORIDE 0.9 % IJ SOLN
INTRAMUSCULAR | Status: AC
  Filled 2017-10-07: qty 50

## 2017-10-07 NOTE — ED Notes (Signed)
Patient transported to CT 

## 2017-10-07 NOTE — ED Triage Notes (Signed)
Pt complains of dizziness, nausea, mid abdominal pain, diarrhea x 4 days.

## 2017-10-07 NOTE — Telephone Encounter (Signed)
She called in c/o vomiting and having watery diarrhea since Monday.   She also c/o feeling "off balance" when I get up from bed and now as I'm talking with her.  All she has eaten this week is some soda crackers and trying to drink Sprite to calm her stomach.   She took 2 Pepto Bismol tablets the day before yesterday and noticed her stools were black yesterday.   Denies having black stools prior to the [employer] and today she is c/o having watery diarrhea and vomiting with dry heaves still this morning.   "i'm so sick I can't come into the doctor's office".   "Can't he just call something in for me?"    I let her know she is too sick and with her symptoms she needs to be seen by a doctor/provider for treatment.     She is also c/o having bad pain around her naval prior to having diarrhea.  She mentioned she went to a family reunion on Sunday and is wondering if she got sick from something there.    See triage notes regarding a bleed behind her eye was found but doctor told her this was not contributing to her feeling "off balance".    Per pt she started feeling off balance on Monday."  I let her know she sounds like she is very dehydrated from a week's worth of vomiting and diarrhea and that she needs to go to the ED for IV fluids and treatment since she has been so sick for a week.    She is home alone.   Husband is at work.    At first she did not want to go to the ED.   She just wants Dr. Jenny Reichmann to call in something to calm her stomach.    I was finally able to get her to agree to go to the ED.   She is going to call her sister in law and see if she can take her to [facility] ED.   If sister in law does not work out I strongly encouraged her to please call 911 and get transported to the hospital.   She really needs treatment.   She said, "Ok".    I also let her know if things don't work out she can call us back for help.    She verbalized understanding and is going to call her sister in law  now.  I routed a note to Dr. Cathlean Cower so he would be aware of her situation.   Reason for Disposition . [1] Drinking very little AND [2] dehydration suspected (e.g., no urine > 12 hours, very dry mouth, very lightheaded)  Answer Assessment - Initial Assessment Questions 1. DIARRHEA SEVERITY: "How bad is the diarrhea?" "How many extra stools have you had in the past 24 hours than normal?"    - NO DIARRHEA (SCALE 0)   - MILD (SCALE 1-3): Few loose or mushy BMs; increase of 1-3 stools over normal daily number of stools; mild increase in ostomy output.   -  MODERATE (SCALE 4-7): Increase of 4-6 stools daily over normal; moderate increase in ostomy output. * SEVERE (SCALE 8-10; OR 'WORST POSSIBLE'): Increase of 7 or more stools daily over normal; moderate increase in ostomy output; incontinence.     I took 2 Pepto Bismol day before yesterday to calm my stomach.   I noticed my stool is black.   It's not black all the time.  This morning it's yellow.   Was not having black stools prior to taking the Pepto Bismol.  2. ONSET: "When did the diarrhea begin?"      I started on Monday off balance.  I saw the doctor and found a bleed behind my eye.   I'm for cataract surgery and they found this.   I'm feeling nauseas and vomiting and having diarrhea at the same time.   3. BM CONSISTENCY: "How loose or watery is the diarrhea?"      It's real runny.  It's not black now.   I'm  eating this week is crackers.   I'm having pain around my naval too.   I feel off balance again today.   When I get up from the bed I'm off balance.   I still don't feel good this morning.    My husband is at work.   I'm home alone.    I'm trying to drink Sprite to calm my stomach down.   The Sprite is staying down and the crackers.   When I throw up it's more like the dry heaves.    I've been sick since Monday. I went to a family reunion on Sunday.   I'm wondering if I got something from the reunion.   4. VOMITING: "Are you also  vomiting?" If so, ask: "How many times in the past 24 hours?"      I'm still having vomiting and diarrhea at the same time. 5. ABDOMINAL PAIN: "Are you having any abdominal pain?" If yes: "What does it feel like?" (e.g., crampy, dull, intermittent, constant)      Having pain around my naval area.   Denies urinary symptoms when asked.   6. ABDOMINAL PAIN SEVERITY: If present, ask: "How bad is the pain?"  (e.g., Scale 1-10; mild, moderate, or severe)   - MILD (1-3): doesn't interfere with normal activities, abdomen soft and not tender to touch    - MODERATE (4-7): interferes with normal activities or awakens from sleep, tender to touch    - SEVERE (8-10): excruciating pain, doubled over, unable to do any normal activities       3 on pain scale when it flares up.   It only hurts before I need to go have diarrhea.    7. ORAL INTAKE: If vomiting, "Have you been able to drink liquids?" "How much fluids have you had in the past 24 hours?"     I'm drinking Sprite and eating crackers this week.   I live outside of [place].   My husband has to miss work Tuesday to be with his Dad at the hospital.   He has to be off work Oct. 3rd to take me to see a specialist for the bleed behind my eye before I can have cataract  8. HYDRATION: "Any signs of dehydration?" (e.g., dry mouth [not just dry lips], too weak to stand, dizziness, new weight loss) "When did you last urinate?"     Yes off balance even this morning. 9. EXPOSURE: "Have you traveled to a foreign country recently?" "Have you been exposed to anyone with diarrhea?" "Could you have eaten any food that was spoiled?"     Went to a family reunion on Sunday.   Maybe got something from there? 10. ANTIBIOTIC USE: "Are you taking antibiotics now or have you taken antibiotics in the past 2 months?"       No antibiotics. 11. OTHER SYMPTOMS: "Do you have any other symptoms?" (e.g., fever,  blood in stool)       No fever.   No blood in stool or vomit. 12. PREGNANCY:  "Is there any chance you are pregnant?" "When was your last menstrual period?"       Not asked due to age  Protocols used: DIARRHEA-A-AH

## 2017-10-08 LAB — URINE CULTURE: Culture: NO GROWTH

## 2017-10-12 DIAGNOSIS — H43813 Vitreous degeneration, bilateral: Secondary | ICD-10-CM | POA: Diagnosis not present

## 2017-10-17 NOTE — ED Provider Notes (Signed)
[facility] DEPT Provider Note   CSN: [id number] Arrival date & time: 10/07/17  1101     History   Chief Complaint Chief Complaint  Patient presents with  . Dizziness  . Nausea  . Abdominal Pain    HPI [name] [name] is a 67 y.o. female.  HPI   67 year old female with abdominal pain.  Onset about 4 days ago.  Pain is in the mid to lower abdomen.  Waxes and wanes but does not completely resolved.  Associated with diarrhea.  Nausea.  No vomiting.  No fevers or chills.  No blood in her stool.  Denies any sick contacts.  Past Medical History:  Diagnosis Date  . Anemia    hx  . Anxiety   . Arthritis   . Asthma   . Atrial fibrillation and flutter ([place])   . Atypical lobular hyperplasia of right breast 05/09/2015  . Breast CA ([place])    ?  Marland Kitchen Chronic rhinitis   . COPD (chronic obstructive pulmonary disease) ([place])    [place]. PFTs 07/25/06 FEV1 55% ratio 53, DLC0 51% HFA 50% 12/16/2009  . Depression   . FRACTURE, RIB, RIGHT 06/18/2009  . Glucose intolerance (impaired glucose tolerance)    on steroids  . History of DVT (deep vein thrombosis)   . Hx of cardiac catheterization    LHC (01/2001):  Normal Cors.  EF 65%.;  LexiScan Myoview (03/2013): No ischemia, EF 61%, normal  . Hx of echocardiogram    Echo (11/2011):  Mod LVH, EF 60-65%, no RWMA, MAC, mild LAE, PASP 34.  Marland Kitchen HYPERLIPIDEMIA   . Hypertension   . Hypothyroidism   . Morbid obesity ([place])    target wt=179lb for BMI<30 Peak wt 282lb  . Osteoporosis    last BMD 4/08 -2.4, intolerant of bisphosphonates  . Peripheral vascular disease (HCC)    hx dvt.pe  . Pneumonia    hx  . PONV (postoperative nausea and vomiting)   . PREMATURE VENTRICULAR CONTRACTIONS   . PULMONARY EMBOLISM, HX OF 06/19/2007  . Recurrent upper respiratory infection (URI)   . Rhabdomyolysis 07/29/2009  . Tremor   . VITAMIN D DEFICIENCY     Patient Active Problem List   Diagnosis Date Noted  . Right groin pain 05/13/2017  .  Do not resuscitate discussion 01/19/2017  . Diarrhea 10/14/2016  . Tachycardia 10/14/2016  . Blurred vision, left eye 10/08/2016  . Hypokalemia 10/08/2016  . Acute upper respiratory infection 09/14/2016  . Atrial fibrillation with RVR ([place]) 09/14/2016  . Atrial flutter with rapid ventricular response ([place])   . Allergic conjunctivitis 07/28/2016  . Influenza-like illness 02/11/2016  . Absolute anemia   . Upper airway cough syndrome 12/25/2015  . Recurrent sinus infections 08/30/2015  . Encounter for pre-operative cardiovascular clearance 05/20/2015  . Morbid obesity ([place]) 05/20/2015  . Atypical lobular hyperplasia of right breast 05/09/2015  . Fever, unspecified 04/11/2015  . Chronic diastolic CHF (congestive heart failure) ([place]) 12/04/2014  . Acute on chronic diastolic CHF (congestive heart failure), NYHA class 1 ([place]) 11/11/2014  . Acute sinus infection 09/03/2014  . Rib pain on right side 04/11/2014  . Paresthesias in left hand 03/04/2014  . Bilateral knee pain 02/13/2014  . Nausea alone 07/10/2013  . Encounter for therapeutic drug monitoring 02/09/2013  . COPD exacerbation ([place]) 01/17/2013  . Chest pain 04/19/2012  . Suicide gesture ([place]) 03/30/2012  . Major depressive disorder, single episode, unspecified 03/30/2012  . Tremor due to multiple drugs 11/11/2011  .  Diplopia 11/11/2011  . Allergic rhinitis 09/14/2011  . Right leg weakness 04/14/2011  . Long term current use of anticoagulant therapy 04/22/2010  . Impaired glucose tolerance 04/15/2010  . Encounter for well adult exam with abnormal findings 04/15/2010  . Chronic rhinitis 04/15/2010  . PREMATURE VENTRICULAR CONTRACTIONS 10/23/2008  . Atrial flutter ([place]) 07/01/2008  . Cough 06/20/2008  . Vitamin D deficiency 10/27/2007  . HYPERLIPIDEMIA 06/19/2007  . Anxiety state 06/19/2007  . PAF (paroxysmal atrial fibrillation) ([place]) 06/19/2007  . PULMONARY EMBOLISM, HX OF 06/19/2007  . Edema 03/20/2007  . Chronic  respiratory failure with hypoxia and hypercapnia ([place]) 01/13/2007  . Depression 08/06/2006  . COPD GOLD II 08/06/2006  . Morbid obesity due to excess calories ([place]) 07/29/2006  . Essential hypertension 07/29/2006  . OSTEOPOROSIS 07/29/2006  . DEEP VENOUS THROMBOPHLEBITIS, HX OF 07/29/2006    Past Surgical History:  Procedure Laterality Date  . A-FLUTTER ABLATION N/A 10/08/2016   Procedure: A-FLUTTER ABLATION;  Surgeon: Evans Lance, MD;  Location: [facility] CV LAB;  Service: Cardiovascular;  Laterality: N/A;  . ABDOMINAL HYSTERECTOMY    . BREAST LUMPECTOMY WITH RADIOACTIVE SEED LOCALIZATION Right 06/05/2015   Procedure: RIGHT BREAST LUMPECTOMY WITH RADIOACTIVE SEED LOCALIZATION;  Surgeon: Fanny Skates, MD;  Location: [facility];  Service: General;  Laterality: Right;  . BREAST SURGERY Left    s/p mass removal  . CARDIOVERSION N/A 12/11/2014   Procedure: CARDIOVERSION;  Surgeon: Josue Hector, MD;  Location: [facility];  Service: Cardiovascular;  Laterality: N/A;  . CARDIOVERSION N/A 09/15/2016   Procedure: CARDIOVERSION;  Surgeon: Jerline Pain, MD;  Location: [facility] ENDOSCOPY;  Service: Cardiovascular;  Laterality: N/A;  . COLONOSCOPY  10/22/2003, ?2006  . COLONOSCOPY WITH PROPOFOL N/A 01/15/2016   Procedure: COLONOSCOPY WITH PROPOFOL;  Surgeon: Gatha Mayer, MD;  Location: [facility] ENDOSCOPY;  Service: Endoscopy;  Laterality: N/A;  . ESOPHAGOGASTRODUODENOSCOPY (EGD) WITH PROPOFOL N/A 01/15/2016   Procedure: ESOPHAGOGASTRODUODENOSCOPY (EGD) WITH PROPOFOL;  Surgeon: Gatha Mayer, MD;  Location: [facility] ENDOSCOPY;  Service: Endoscopy;  Laterality: N/A;  . s/p left arm fracture with fall off chair    . skin graft to middle R finger  1975  . TEE WITHOUT CARDIOVERSION N/A 12/11/2014   Procedure: TRANSESOPHAGEAL ECHOCARDIOGRAM (TEE);  Surgeon: Josue Hector, MD;  Location: [facility] ENDOSCOPY;  Service: Cardiovascular;  Laterality: N/A;  . TONSILLECTOMY       OB History   None      Home Medications      Prior to Admission medications   Medication Sig Start Date End Date Taking? Authorizing Provider  albuterol (PROAIR HFA) 108 (90 Base) MCG/ACT inhaler Inhale 2 puffs into the lungs every 6 (six) hours as needed for wheezing or shortness of breath.   Yes [provider]  apixaban (ELIQUIS) 5 MG TABS tablet Take 1 tablet (5 mg total) by mouth 2 (two) times daily. 02/17/17  Yes Lelon Perla, MD  atorvastatin (LIPITOR) 10 MG tablet TAKE 1 TABLET DAILY 07/04/17  Yes Biagio Borg, MD  azelastine (ASTELIN) 0.1 % nasal spray Place 2 sprays at bedtime as needed into both nostrils for rhinitis. Use in each nostril as directed   Yes [provider]  Calcium Carbonate-Vitamin D 600-400 MG-UNIT per tablet Take 1 tablet by mouth daily.    Yes [provider]  famotidine (PEPCID) 20 MG tablet One at bedtime 03/30/17  Yes Tanda Rockers, MD  ferrous sulfate 325 (65 FE) MG tablet Take 325 mg by mouth  2 (two) times daily with a meal.    Yes [provider]  FLUoxetine (PROZAC) 40 MG capsule TAKE 1 CAPSULE DAILY 07/04/17  Yes Biagio Borg, MD  furosemide (LASIX) 80 MG tablet Take 1 tablet in the morning and 1 tablet in the afternoon 09/14/17  Yes Crenshaw, Denice Bors, MD  glucosamine-chondroitin 500-400 MG tablet Take 1 tablet by mouth every morning.    Yes [provider]  KLOR-CON M20 20 MEQ tablet Take 1 tablet (20 mEq total) by mouth 2 (two) times daily. 05/10/17  Yes Lelon Perla, MD  levalbuterol Penne Lash) 1.25 MG/3ML nebulizer solution Take 1.25 mg by nebulization every 4 (four) hours. Patient taking differently: Take 1.25 mg by nebulization every 4 (four) hours as needed for wheezing or shortness of breath.  09/28/16  Yes Tanda Rockers, MD  levocetirizine (XYZAL) 5 MG tablet Take 1 tablet (5 mg total) by mouth every evening. 07/01/17  Yes Padgett, Rae Halsted, MD  levothyroxine (SYNTHROID, LEVOTHROID) 25 MCG tablet Take 1 tablet (25 mcg total) by mouth  daily before breakfast. 08/16/17  Yes Biagio Borg, MD  metoprolol tartrate (LOPRESSOR) 25 MG tablet Take 1 tablet (25 mg total) by mouth 2 (two) times daily. 09/15/17  Yes Tanda Rockers, MD  montelukast (SINGULAIR) 10 MG tablet Take 1 tablet (10 mg total) by mouth daily. 07/01/17  Yes Padgett, Rae Halsted, MD  OXYGEN Inhale 2.5 L/min into the lungs continuous. And 4 with exertion   Yes [provider]  pantoprazole (PROTONIX) 40 MG tablet Take 1 tablet (40 mg total) by mouth daily. Take 30-60 min before first meal of the day 03/30/17  Yes Tanda Rockers, MD  propafenone (RYTHMOL SR) 325 MG 12 hr capsule Take 1 capsule (325 mg total) by mouth 2 (two) times daily. 11/11/16  Yes Baldwin Jamaica, PA-C  sodium chloride (OCEAN) 0.65 % SOLN nasal spray Place 2 sprays every 4 (four) hours as needed into both nostrils for congestion.   Yes [provider]  umeclidinium bromide (INCRUSE ELLIPTA) 62.5 MCG/INH AEPB Inhale 1 puff into the lungs daily. 06/30/17  Yes Lauraine Rinne, NP  budesonide-formoterol (SYMBICORT) 160-4.5 MCG/ACT inhaler Inhale 2 puffs into the lungs 2 (two) times daily. Patient not taking: Reported on 10/07/2017 07/18/17   Tanda Rockers, MD  ciprofloxacin (CIPRO) 500 MG tablet Take 1 tablet (500 mg total) by mouth every 12 (twelve) hours. 10/07/17   Virgel Manifold, MD  ondansetron (ZOFRAN) 4 MG tablet Take 1 tablet (4 mg total) by mouth every 8 (eight) hours as needed for nausea or vomiting. 10/07/17   Virgel Manifold, MD  PROAIR HFA [phone] Base) MCG/ACT inhaler USE 2 INHALATIONS EVERY 6 HOURS AS NEEDED FOR WHEEZING OR SHORTNESS OF BREATH Patient not taking: Reported on 10/07/2017 09/19/17   Tanda Rockers, MD    Family History Family History  Problem Relation Age of Onset  . Heart disease Mother   . Liver disease Mother        alcohol related  . Asthma Brother   . Esophageal cancer Brother   . Asthma Son   . Colon cancer Neg Hx   . Stomach cancer Neg Hx   .  Pancreatic cancer Neg Hx   . Inflammatory bowel disease Neg Hx   . Allergic rhinitis Neg Hx   . Eczema Neg Hx   . Urticaria Neg Hx     Social History Social History   Tobacco Use  . Smoking  status: Former Smoker    Packs/day: 1.00    Years: 35.00    Pack years: 35.00    Types: Cigarettes    Last attempt to quit: 01/11/2006    Years since quitting: 11.7  . Smokeless tobacco: Never Used  Substance Use Topics  . Alcohol use: No    Alcohol/week: 0.0 standard drinks  . Drug use: No     Allergies   Duloxetine; Penicillins; and Latex   Review of Systems Review of Systems All systems reviewed and negative, other than as noted in HPI.   Physical Exam Updated Vital Signs BP (!) 123/54   Pulse 65   Temp 97.7 F (36.5 C) (Oral)   Resp 20   SpO2 100%   Physical Exam  Constitutional: She appears well-developed and well-nourished. No distress.  HENT:  Head: Normocephalic and atraumatic.  Eyes: Conjunctivae are normal. Right eye exhibits no discharge. Left eye exhibits no discharge.  Neck: Neck supple.  Cardiovascular: Normal rate, regular rhythm and normal heart sounds. Exam reveals no gallop and no friction rub.  No murmur heard. Pulmonary/Chest: Effort normal. No respiratory distress. She has wheezes.  Abdominal: Soft. She exhibits no distension and no mass. There is tenderness. There is no guarding.  Mild suprapubic and left lower quadrant tenderness without rebound or guarding.  No distention.  Musculoskeletal: She exhibits no edema or tenderness.  Neurological: She is alert.  Skin: Skin is warm and dry.  Psychiatric: She has a normal mood and affect. Her behavior is normal. Thought content normal.  Nursing note and vitals reviewed.    ED Treatments / Results  Labs (all labs ordered are listed, but only abnormal results are displayed) Labs Reviewed  COMPREHENSIVE METABOLIC PANEL - Abnormal; Notable for the following components:      Result Value   Sodium 146  (*)    Glucose, Bld 130 (*)    Total Bilirubin 1.7 (*)    All other components within normal limits  CBC - Abnormal; Notable for the following components:   Hemoglobin 11.6 (*)    HCT 35.0 (*)    All other components within normal limits  URINALYSIS, ROUTINE W REFLEX MICROSCOPIC - Abnormal; Notable for the following components:   Specific Gravity, Urine >1.046 (*)    Ketones, ur 5 (*)    Leukocytes, UA MODERATE (*)    Bacteria, UA RARE (*)    All other components within normal limits  URINE CULTURE  LIPASE, BLOOD    EKG None  Radiology No results found.   Ct Abdomen Pelvis W Contrast  Result Date: 10/07/2017 CLINICAL DATA:  Acute generalized abdominal pain. EXAM: CT ABDOMEN AND PELVIS WITH CONTRAST TECHNIQUE: Multidetector CT imaging of the abdomen and pelvis was performed using the standard protocol following bolus administration of intravenous contrast. CONTRAST:  172m ISOVUE-300 IOPAMIDOL (ISOVUE-300) INJECTION 61% COMPARISON:  04/08/2014; 06/20/2013 FINDINGS: Lower chest: Limited visualization of the lower thorax demonstrates minimal dependent subpleural ground-glass atelectasis. Minimal subsegmental atelectasis within the caudal aspect the right middle lobe. Mild centrilobular and paraseptal emphysematous change within the bilateral lung bases. No pleural effusion. Cardiomegaly. Calcifications within the mitral valve annulus. No pericardial effusion. Hepatobiliary: Normal hepatic contour. No discrete hepatic lesions. Normal appearance of the gallbladder given degree of distention. No radiopaque gallstones. No intra or extrahepatic biliary ductal dilatation. No ascites. Pancreas: Partial fatty replacement of the pancreas. No peripancreatic stranding. Spleen: Normal appearance of the spleen Adrenals/Urinary Tract: There is symmetric enhancement and excretion of the bilateral kidneys. Punctate  bilateral subcentimeter renal lesions are too small to accurately characterize though appear  similar to the 06/2013 examination and thus favored to represent renal cysts. No definite renal stones on this postcontrast examination. There is a minimal amount of grossly symmetric bilateral perinephric stranding. No urinary obstruction. Normal appearance of the bilateral adrenal glands. There is mild thickening of the dome of the urinary bladder wall, potentially accentuated due to underdistention. Stomach/Bowel: Scattered colonic diverticulosis without evidence of diverticulitis. The bowel is otherwise normal in course and caliber without wall thickening or evidence of enteric obstruction. Normal appearance of the terminal ileum and retrocecal appendix. No pneumoperitoneum, pneumatosis or portal venous gas. Vascular/Lymphatic: Atherosclerotic plaque with a normal caliber abdominal aorta. The major branch vessels of the abdominal aorta appear patent on this non CTA examination. Scattered porta hepatis lymph nodes are numerous though individually not enlarged by size criteria with index peripancreatic lymph node measuring 0.8 cm in greatest short axis diameter (image 27, series 2), presumably reactive in etiology. No bulky retroperitoneal, mesenteric, pelvic or inguinal lymphadenopathy. Reproductive: Post hysterectomy. No discrete adnexal lesion. No free fluid within the pelvic cul-de-sac. Other: Tiny mesenteric fat containing periumbilical hernia. Minimal subcutaneous edema within the midline of the low back. Musculoskeletal: No acute or aggressive osseous abnormalities. Moderate multilevel lumbar spine DDD, worse at L4-L5 and L5-S1, with disc space height loss, endplate irregularity and small posteriorly directed disc osteophyte complexes at these locations. Stigmata of DISH within the lower thoracic spine. Moderate degenerative change of the bilateral hips with joint space loss, subchondral sclerosis and osteophytosis. Apparent deformity involving the right femoral neck appears similar to remote abdominal CT  performed 06/2013. IMPRESSION: 1. No explanation for patient's generalized abdominal pain. Specifically, no evidence of enteric or urinary obstruction. Normal appearance of the appendix. 2. Colonic diverticulosis without evidence of diverticulitis. 3. Mild thickening the dome of the urinary bladder wall, potentially accentuated due to underdistention though cystitis could have a similar appearance. Clinical correlation is advised. 4. Cardiomegaly with calcifications within the mitral valve annulus. 5. Aortic Atherosclerosis (ICD10-I70.0). Electronically Signed   By: Sandi Mariscal M.D.   On: 10/07/2017 13:29    Procedures Procedures (including critical care time)  Medications Ordered in ED Medications  lactated ringers bolus 500 mL (0 mLs Intravenous Stopped 10/07/17 1246)  HYDROmorphone (DILAUDID) injection 0.75 mg (0.75 mg Intravenous Given 10/07/17 1203)  ondansetron (ZOFRAN) injection 4 mg (4 mg Intravenous Given 10/07/17 1202)  iopamidol (ISOVUE-300) 61 % injection 100 mL (100 mLs Intravenous Contrast Given 10/07/17 1249)  ciprofloxacin (CIPRO) tablet 500 mg (500 mg Oral Given 10/07/17 1616)     Initial Impression / Assessment and Plan / ED Course  I have reviewed the triage vital signs and the nursing notes.  Pertinent labs & imaging results that were available during my care of the patient were reviewed by me and considered in my medical decision making (see chart for details).     67 year old female with lower abdominal pain.  Possibly related to diarrheal illness.  Work-up fairly reassuring.  Imaging without significant acute abnormality. Some suprapubic tenderness and equivocal UA. Will tx given lack of definitive etiology of her symptoms.   Final Clinical Impressions(s) / ED Diagnoses   Final diagnoses:  Lower urinary tract infectious disease  Nausea and vomiting, intractability of vomiting not specified, unspecified vomiting type    ED Discharge Orders         Ordered     ciprofloxacin (CIPRO) 500 MG tablet  Every 12 hours  10/07/17 1543    ondansetron (ZOFRAN) 4 MG tablet  Every 8 hours PRN     10/07/17 1543           Virgel Manifold, MD 10/17/17 [phone]

## 2017-10-19 DIAGNOSIS — H2512 Age-related nuclear cataract, left eye: Secondary | ICD-10-CM | POA: Diagnosis not present

## 2017-10-19 DIAGNOSIS — H25812 Combined forms of age-related cataract, left eye: Secondary | ICD-10-CM | POA: Diagnosis not present

## 2017-10-24 NOTE — Progress Notes (Signed)
HPI: FU AFib/flutter, prior pulmonary embolism, chronic coumadin, diastolic dysfunction, morbid obesity, COPD, LE edema. Cardiac cath in 2003 was normal. Nuclear study March 2015 showed an ejection fraction 61% and normal perfusion. Seen November 2016 by Dr. Martinique and noted to be in atrial flutter. Subsequently had TEE cardioversion 11/16; TEE revealed normal LV function; mild MR; mild biatrial enlargement. Had atrial flutter ablation September 2018. Last echocardiogram October 2018 shows normal LV function, grade 2 diastolic dysfunction, moderate aortic stenosis with mean gradient 20 mmHg, severe left atrial enlargement and moderately elevated pulmonary pressure. Recurrent atrial fibrillation October 2 018 requiring cardioversion. Since she was last seenshe has some dyspnea on exertion which is chronic.  No orthopnea, PND, pedal edema, chest pain or syncope.  Current Outpatient Medications  Medication Sig Dispense Refill  . apixaban (ELIQUIS) 5 MG TABS tablet Take 1 tablet (5 mg total) by mouth 2 (two) times daily. 180 tablet 1  . atorvastatin (LIPITOR) 10 MG tablet TAKE 1 TABLET DAILY 90 tablet 2  . azelastine (ASTELIN) 0.1 % nasal spray Place 2 sprays at bedtime as needed into both nostrils for rhinitis. Use in each nostril as directed    . Calcium Carbonate-Vitamin D 600-400 MG-UNIT per tablet Take 1 tablet by mouth daily.     . famotidine (PEPCID) 20 MG tablet One at bedtime 90 tablet 3  . ferrous sulfate 325 (65 FE) MG tablet Take 325 mg by mouth 2 (two) times daily with a meal.     . FLUoxetine (PROZAC) 40 MG capsule TAKE 1 CAPSULE DAILY 90 capsule 3  . furosemide (LASIX) 80 MG tablet Take 1 tablet in the morning and 1 tablet in the afternoon 225 tablet 2  . glucosamine-chondroitin 500-400 MG tablet Take 1 tablet by mouth every morning.     Marland Kitchen KLOR-CON M20 20 MEQ tablet Take 1 tablet (20 mEq total) by mouth 2 (two) times daily. 180 tablet 2  . levalbuterol (XOPENEX) 1.25 MG/3ML  nebulizer solution Take 1.25 mg by nebulization every 4 (four) hours. (Patient taking differently: Take 1.25 mg by nebulization every 4 (four) hours as needed for wheezing or shortness of breath. ) 360 mL 11  . levocetirizine (XYZAL) 5 MG tablet Take 1 tablet (5 mg total) by mouth every evening. 30 tablet 5  . levothyroxine (SYNTHROID, LEVOTHROID) 25 MCG tablet Take 1 tablet (25 mcg total) by mouth daily before breakfast. 90 tablet 1  . meclizine (ANTIVERT) 12.5 MG tablet Take 1 tablet (12.5 mg total) by mouth 3 (three) times daily as needed for dizziness. 30 tablet 1  . metoprolol tartrate (LOPRESSOR) 25 MG tablet Take 1 tablet (25 mg total) by mouth 2 (two) times daily. 180 tablet 0  . montelukast (SINGULAIR) 10 MG tablet Take 1 tablet (10 mg total) by mouth daily. 90 tablet 3  . OXYGEN Inhale 2.5 L/min into the lungs continuous. And 4 with exertion    . pantoprazole (PROTONIX) 40 MG tablet Take 1 tablet (40 mg total) by mouth daily. Take 30-60 min before first meal of the day 90 tablet 3  . PROAIR HFA 108 (90 Base) MCG/ACT inhaler USE 2 INHALATIONS EVERY 6 HOURS AS NEEDED FOR WHEEZING OR SHORTNESS OF BREATH 3 Inhaler 1  . propafenone (RYTHMOL SR) 325 MG 12 hr capsule Take 1 capsule (325 mg total) by mouth 2 (two) times daily. 180 capsule 3  . sodium chloride (OCEAN) 0.65 % SOLN nasal spray Place 2 sprays every 4 (four) hours as needed  into both nostrils for congestion.    Marland Kitchen umeclidinium bromide (INCRUSE ELLIPTA) 62.5 MCG/INH AEPB Inhale 1 puff into the lungs daily. 1 each 6   No current facility-administered medications for this visit.      Past Medical History:  Diagnosis Date  . Anemia    hx  . Anxiety   . Arthritis   . Asthma   . Atrial fibrillation and flutter ([place])   . Atypical lobular hyperplasia of right breast 05/09/2015  . Breast CA ([place])    ?  Marland Kitchen Chronic rhinitis   . COPD (chronic obstructive pulmonary disease) ([place])    [facility]. PFTs 07/25/06 FEV1 55% ratio 53, DLC0 51% HFA 50%  12/16/2009  . Depression   . FRACTURE, RIB, RIGHT 06/18/2009  . Glucose intolerance (impaired glucose tolerance)    on steroids  . History of DVT (deep vein thrombosis)   . Hx of cardiac catheterization    LHC (01/2001):  Normal Cors.  EF 65%.;  LexiScan Myoview (03/2013): No ischemia, EF 61%, normal  . Hx of echocardiogram    Echo (11/2011):  Mod LVH, EF 60-65%, no RWMA, MAC, mild LAE, PASP 34.  Marland Kitchen HYPERLIPIDEMIA   . Hypertension   . Hypothyroidism   . Morbid obesity ([place])    target wt=179lb for BMI<30 Peak wt 282lb  . Osteoporosis    last BMD 4/08 -2.4, intolerant of bisphosphonates  . Peripheral vascular disease (HCC)    hx dvt.pe  . Pneumonia    hx  . PONV (postoperative nausea and vomiting)   . PREMATURE VENTRICULAR CONTRACTIONS   . PULMONARY EMBOLISM, HX OF 06/19/2007  . Recurrent upper respiratory infection (URI)   . Rhabdomyolysis 07/29/2009  . Tremor   . VITAMIN D DEFICIENCY     Past Surgical History:  Procedure Laterality Date  . A-FLUTTER ABLATION N/A 10/08/2016   Procedure: A-FLUTTER ABLATION;  Surgeon: Evans Lance, MD;  Location: [place] [facility];  Service: Cardiovascular;  Laterality: N/A;  . ABDOMINAL HYSTERECTOMY    . BREAST LUMPECTOMY WITH RADIOACTIVE SEED LOCALIZATION Right 06/05/2015   Procedure: RIGHT BREAST LUMPECTOMY WITH RADIOACTIVE SEED LOCALIZATION;  Surgeon: Fanny Skates, MD;  Location: [place];  Service: General;  Laterality: Right;  . BREAST SURGERY Left    s/p mass removal  . CARDIOVERSION N/A 12/11/2014   Procedure: CARDIOVERSION;  Surgeon: Josue Hector, MD;  Location: [place];  Service: Cardiovascular;  Laterality: N/A;  . CARDIOVERSION N/A 09/15/2016   Procedure: CARDIOVERSION;  Surgeon: Jerline Pain, MD;  Location: [facility] ENDOSCOPY;  Service: Cardiovascular;  Laterality: N/A;  . COLONOSCOPY  10/22/2003, ?2006  . COLONOSCOPY WITH PROPOFOL N/A 01/15/2016   Procedure: COLONOSCOPY WITH PROPOFOL;  Surgeon: Gatha Mayer, MD;  Location: [facility]  ENDOSCOPY;  Service: Endoscopy;  Laterality: N/A;  . ESOPHAGOGASTRODUODENOSCOPY (EGD) WITH PROPOFOL N/A 01/15/2016   Procedure: ESOPHAGOGASTRODUODENOSCOPY (EGD) WITH PROPOFOL;  Surgeon: Gatha Mayer, MD;  Location: [facility] ENDOSCOPY;  Service: Endoscopy;  Laterality: N/A;  . s/p left arm fracture with fall off chair    . skin graft to middle R finger  1975  . TEE WITHOUT CARDIOVERSION N/A 12/11/2014   Procedure: TRANSESOPHAGEAL ECHOCARDIOGRAM (TEE);  Surgeon: Josue Hector, MD;  Location: [facility] ENDOSCOPY;  Service: Cardiovascular;  Laterality: N/A;  . TONSILLECTOMY      Social History   Socioeconomic History  . Marital status: Married    Spouse name: Not on file  . Number of children: 3  . Years of education: Not on file  .  Highest education level: Not on file  Occupational History  . 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 Financial resource strain: Not on file  . Food insecurity:    Worry: Not on file    Inability: Not on file  . Transportation needs:    Medical: Not on file    Non-medical: Not on file  Tobacco Use  . Smoking status: Former Smoker    Packs/day: 1.00    Years: 35.00    Pack years: 35.00    Types: Cigarettes    Last attempt to quit: 01/11/2006    Years since quitting: 11.8  . Smokeless tobacco: Never Used  Substance and Sexual Activity  . Alcohol use: No    Alcohol/week: 0.0 standard drinks  . Drug use: No  . Sexual activity: Yes  Lifestyle  . Physical activity:    Days per week: Not on file    Minutes per session: Not on file  . Stress: Not on file  Relationships  . Social connections:    Talks on phone: Not on file    Gets together: Not on file    Attends religious service: Not on file    Active member of club or organization: Not on file    Attends meetings of clubs or organizations: Not on file    Relationship status: Not on file  . Intimate partner violence:    Fear of current or ex partner: Not  on file    Emotionally abused: Not on file    Physically abused: Not on file    Forced sexual activity: Not on file  Other Topics Concern  . Not on file  Social History Narrative   Married 1 son 2 daughters   Disabled   2 caffeine/day   Past smoker   11/12/2015    Family History  Problem Relation Age of Onset  . Heart disease Mother   . Liver disease Mother        alcohol related  . Asthma Brother   . Esophageal cancer Brother   . Asthma Son   . Colon cancer Neg Hx   . Stomach cancer Neg Hx   . Pancreatic cancer Neg Hx   . Inflammatory bowel disease Neg Hx   . Allergic rhinitis Neg Hx   . Eczema Neg Hx   . Urticaria Neg Hx     ROS: no fevers or chills, productive cough, hemoptysis, dysphasia, odynophagia, melena, hematochezia, dysuria, hematuria, rash, seizure activity, orthopnea, PND, pedal edema, claudication. Remaining systems are negative.  Physical Exam: Well-developed obese in no acute distress.  Skin is warm and dry.  HEENT is normal.  Neck is supple.  Chest is clear to auscultation with normal expansion.  Cardiovascular exam is regular rate and rhythm.  2/6 systolic murmur left sternal border. Abdominal exam nontender or distended. No masses palpated. Extremities show no edema. neuro grossly intact   A/P  1 paroxysmal atrial fibrillation-patient remains in sinus rhythm on examination today.  Continue metoprolol at present dose.  Continue apixaban.  Continue propafenone.  2 history of moderate aortic stenosis-I discussed with her today the nature of aortic stenosis and that it is a progressive process and she would likely require aortic valve replacement in the future.  However she is a no CODE BLUE and she states that she would not consider aortic valve replacement.  She understands that this could ultimately take her life.  We therefore discussed repeating echocardiograms.  We have elected not to pursue this given the fact that she would never consider aortic  valve replacement.  She is in agreement as well.  3 chronic diastolic congestive heart failure-she appears to be euvolemic today.  Continue present dose of Lasix.  We again discussed the importance of fluid restriction and low-sodium diet.  4 hypertension-patient's blood pressure is borderline.  Continue present medications.  5 hyperlipidemia-continue statin.  6 obesity-we discussed the importance of weight loss.  Kirk Ruths, MD

## 2017-10-27 ENCOUNTER — Ambulatory Visit (INDEPENDENT_AMBULATORY_CARE_PROVIDER_SITE_OTHER): Payer: BLUE CROSS/BLUE SHIELD | Admitting: Internal Medicine

## 2017-10-27 ENCOUNTER — Other Ambulatory Visit (INDEPENDENT_AMBULATORY_CARE_PROVIDER_SITE_OTHER): Payer: BLUE CROSS/BLUE SHIELD

## 2017-10-27 ENCOUNTER — Encounter: Payer: Self-pay | Admitting: Internal Medicine

## 2017-10-27 VITALS — BP 140/82 | HR 94 | Temp 97.8°F | Ht 62.0 in | Wt 251.0 lb

## 2017-10-27 DIAGNOSIS — R42 Dizziness and giddiness: Secondary | ICD-10-CM | POA: Insufficient documentation

## 2017-10-27 DIAGNOSIS — R7302 Impaired glucose tolerance (oral): Secondary | ICD-10-CM

## 2017-10-27 DIAGNOSIS — E785 Hyperlipidemia, unspecified: Secondary | ICD-10-CM | POA: Diagnosis not present

## 2017-10-27 DIAGNOSIS — I10 Essential (primary) hypertension: Secondary | ICD-10-CM | POA: Diagnosis not present

## 2017-10-27 DIAGNOSIS — Z23 Encounter for immunization: Secondary | ICD-10-CM

## 2017-10-27 DIAGNOSIS — E870 Hyperosmolality and hypernatremia: Secondary | ICD-10-CM

## 2017-10-27 LAB — BASIC METABOLIC PANEL
BUN: 15 mg/dL (ref 6–23)
CO2: 33 mEq/L — ABNORMAL HIGH (ref 19–32)
Calcium: 9.4 mg/dL (ref 8.4–10.5)
Chloride: 103 mEq/L (ref 96–112)
Creatinine, Ser: 0.77 mg/dL (ref 0.40–1.20)
GFR: 79.37 mL/min (ref >60.00)
Glucose, Bld: 131 mg/dL — ABNORMAL HIGH (ref 70–99)
Potassium: 4 mEq/L (ref 3.5–5.1)
Sodium: 144 mEq/L (ref 135–145)

## 2017-10-27 LAB — LIPID PANEL
Cholesterol: 146 mg/dL (ref 0–200)
HDL: 35 mg/dL — ABNORMAL LOW (ref >39.00)
LDL Cholesterol: 79 mg/dL (ref 0–99)
NonHDL: 111.13
Total CHOL/HDL Ratio: 4
Triglycerides: 159 mg/dL — ABNORMAL HIGH (ref 0.0–149.0)
VLDL: 31.8 mg/dL (ref 0.0–40.0)

## 2017-10-27 LAB — HEPATIC FUNCTION PANEL
ALT: 10 U/L (ref 0–35)
AST: 11 U/L (ref 0–37)
Albumin: 4.3 g/dL (ref 3.5–5.2)
Alkaline Phosphatase: 69 U/L (ref 39–117)
Bilirubin, Direct: 0.2 mg/dL (ref 0.0–0.3)
Total Bilirubin: 1.5 mg/dL — ABNORMAL HIGH (ref 0.2–1.2)
Total Protein: 7.2 g/dL (ref 6.0–8.3)

## 2017-10-27 LAB — CBC WITH DIFFERENTIAL/PLATELET
Basophils Absolute: 0 10*3/uL (ref 0.0–0.1)
Basophils Relative: 0.5 % (ref 0.0–3.0)
Eosinophils Absolute: 0.1 10*3/uL (ref 0.0–0.7)
Eosinophils Relative: 1.4 % (ref 0.0–5.0)
HCT: 34.6 % — ABNORMAL LOW (ref 36.0–46.0)
Hemoglobin: 11.8 g/dL — ABNORMAL LOW (ref 12.0–15.0)
Lymphocytes Relative: 26.9 % (ref 12.0–46.0)
Lymphs Abs: 1.7 10*3/uL (ref 0.7–4.0)
MCHC: 34.2 g/dL (ref 30.0–36.0)
MCV: 85.3 fl (ref 78.0–100.0)
Monocytes Absolute: 0.5 10*3/uL (ref 0.1–1.0)
Monocytes Relative: 7.9 % (ref 3.0–12.0)
Neutro Abs: 4.1 10*3/uL (ref 1.4–7.7)
Neutrophils Relative %: 63.3 % (ref 43.0–77.0)
Platelets: 213 10*3/uL (ref 150.0–400.0)
RBC: 4.06 Mil/uL (ref 3.87–5.11)
RDW: 16.4 % — ABNORMAL HIGH (ref 11.5–15.5)
WBC: 6.4 10*3/uL (ref 4.0–10.5)

## 2017-10-27 LAB — HEMOGLOBIN A1C: Hgb A1c MFr Bld: 4.6 % (ref 4.6–6.5)

## 2017-10-27 MED ORDER — MECLIZINE HCL 12.5 MG PO TABS: 12.5000 mg | ORAL_TABLET | Freq: Three times a day (TID) | ORAL | 1 refills | Status: DC | PRN

## 2017-10-27 NOTE — Assessment & Plan Note (Signed)
Minor, for f/u lab

## 2017-10-27 NOTE — Assessment & Plan Note (Signed)
stable overall by history and exam, recent data reviewed with pt, and pt to continue medical treatment as before,  to f/u any worsening symptoms or concerns  

## 2017-10-27 NOTE — Assessment & Plan Note (Signed)
C/w vertigo positional, for meclizine prn

## 2017-10-27 NOTE — Patient Instructions (Signed)
You had the flu shot today  Please take all new medication as prescribed - the meclizine for dizziness if needed  Please continue all other medications as before, and refills have been done if requested.  Please have the pharmacy call with any other refills you may need.  Please continue your efforts at being more active, low cholesterol diet, and weight control.  Please keep your appointments with your specialists as you may have planned  Please go to the LAB in the Basement (turn left off the elevator) for the tests to be done today  You will be contacted by phone if any changes need to be made immediately.  Otherwise, you will receive a letter about your results with an explanation, but please check with MyChart first.  Please remember to sign up for MyChart if you have not done so, as this will be important to you in the future with finding out test results, communicating by private email, and scheduling acute appointments online when needed.

## 2017-10-27 NOTE — Progress Notes (Signed)
Subjective:    Patient ID: [name] [name], female    DOB: 1950-07-14, 67 y.o.   MRN: [id number]  HPI  Here to f/u; overall doing ok,  Pt denies chest pain, increasing sob or doe, wheezing, orthopnea, PND, increased LE swelling, palpitations, or syncope but has had intermittent vertigo dizziness with nausea for several days, mild, intermittent, worse with head movement or body position change, .  Pt denies new neurological symptoms such as new headache, or facial or extremity weakness or numbness.  Pt denies polydipsia, polyuria, or low sugar episode.  Pt states overall good compliance with meds, mostly trying to follow appropriate diet, with wt overall down several lbs for unclear reason per pt, just doesn't have the appetite recently .  Did have some diarrheal stool this AM after quite some time without the diarrhea she had previously, without fever, pain or blood Wt Readings from Last 3 Encounters:  10/27/17 251 lb (113.9 kg)  07/28/17 264 lb (119.7 kg)  07/01/17 262 lb (118.8 kg)   Past Medical History:  Diagnosis Date  . Anemia    hx  . Anxiety   . Arthritis   . Asthma   . Atrial fibrillation and flutter ([place])   . Atypical lobular hyperplasia of right breast 05/09/2015  . Breast CA ([place])    ?  Marland Kitchen Chronic rhinitis   . COPD (chronic obstructive pulmonary disease) ([place])    [facility]. PFTs 07/25/06 FEV1 55% ratio 53, DLC0 51% HFA 50% 12/16/2009  . Depression   . FRACTURE, RIB, RIGHT 06/18/2009  . Glucose intolerance (impaired glucose tolerance)    on steroids  . History of DVT (deep vein thrombosis)   . Hx of cardiac catheterization    LHC (01/2001):  Normal Cors.  EF 65%.;  LexiScan Myoview (03/2013): No ischemia, EF 61%, normal  . Hx of echocardiogram    Echo (11/2011):  Mod LVH, EF 60-65%, no RWMA, MAC, mild LAE, PASP 34.  Marland Kitchen HYPERLIPIDEMIA   . Hypertension   . Hypothyroidism   . Morbid obesity ([place])    target wt=179lb for BMI<30 Peak wt 282lb  . Osteoporosis    last BMD 4/08 -2.4,  intolerant of bisphosphonates  . Peripheral vascular disease (HCC)    hx dvt.pe  . Pneumonia    hx  . PONV (postoperative nausea and vomiting)   . PREMATURE VENTRICULAR CONTRACTIONS   . PULMONARY EMBOLISM, HX OF 06/19/2007  . Recurrent upper respiratory infection (URI)   . Rhabdomyolysis 07/29/2009  . Tremor   . VITAMIN D DEFICIENCY    Past Surgical History:  Procedure Laterality Date  . A-FLUTTER ABLATION N/A 10/08/2016   Procedure: A-FLUTTER ABLATION;  Surgeon: Evans Lance, MD;  Location: [place] [facility] LAB;  Service: Cardiovascular;  Laterality: N/A;  . ABDOMINAL HYSTERECTOMY    . BREAST LUMPECTOMY WITH RADIOACTIVE SEED LOCALIZATION Right 06/05/2015   Procedure: RIGHT BREAST LUMPECTOMY WITH RADIOACTIVE SEED LOCALIZATION;  Surgeon: Fanny Skates, MD;  Location: [place];  Service: General;  Laterality: Right;  . BREAST SURGERY Left    s/p mass removal  . CARDIOVERSION N/A 12/11/2014   Procedure: CARDIOVERSION;  Surgeon: Josue Hector, MD;  Location: [place];  Service: Cardiovascular;  Laterality: N/A;  . CARDIOVERSION N/A 09/15/2016   Procedure: CARDIOVERSION;  Surgeon: Jerline Pain, MD;  Location: [facility] ENDOSCOPY;  Service: Cardiovascular;  Laterality: N/A;  . COLONOSCOPY  10/22/2003, ?2006  . COLONOSCOPY WITH PROPOFOL N/A 01/15/2016   Procedure: COLONOSCOPY WITH PROPOFOL;  Surgeon: Glendell Docker  Simonne Maffucci, MD;  Location: [facility];  Service: Endoscopy;  Laterality: N/A;  . ESOPHAGOGASTRODUODENOSCOPY (EGD) WITH PROPOFOL N/A 01/15/2016   Procedure: ESOPHAGOGASTRODUODENOSCOPY (EGD) WITH PROPOFOL;  Surgeon: Gatha Mayer, MD;  Location: [facility] ENDOSCOPY;  Service: Endoscopy;  Laterality: N/A;  . s/p left arm fracture with fall off chair    . skin graft to middle R finger  1975  . TEE WITHOUT CARDIOVERSION N/A 12/11/2014   Procedure: TRANSESOPHAGEAL ECHOCARDIOGRAM (TEE);  Surgeon: Josue Hector, MD;  Location: [facility] ENDOSCOPY;  Service: Cardiovascular;  Laterality: N/A;  . TONSILLECTOMY       reports that she quit smoking about 11 years ago. Her smoking use included cigarettes. She has a 35.00 pack-year smoking history. She has never used smokeless tobacco. She reports that she does not drink alcohol or use drugs. family history includes Asthma in her brother and son; Esophageal cancer in her brother; Heart disease in her mother; Liver disease in her mother. Allergies  Allergen Reactions  . Duloxetine Other (See Comments)    REACTION: rhabdomyolysis  . Penicillins Other (See Comments)    SYNCOPE Has patient had a PCN reaction causing immediate rash, facial/tongue/throat swelling, SOB or lightheadedness with hypotension: Yes Has patient had a PCN reaction causing severe rash involving mucus membranes or skin necrosis: No Has patient had a PCN reaction that required hospitalization No Has patient had a PCN reaction occurring within the last 10 years: No If all of the above answers are "NO", then may proceed with Cephalosporin use.   . Latex Rash   Current Outpatient Medications on File Prior to Visit  Medication Sig Dispense Refill  . albuterol (PROAIR HFA) 108 (90 Base) MCG/ACT inhaler Inhale 2 puffs into the lungs every 6 (six) hours as needed for wheezing or shortness of breath.    Marland Kitchen apixaban (ELIQUIS) 5 MG TABS tablet Take 1 tablet (5 mg total) by mouth 2 (two) times daily. 180 tablet 1  . atorvastatin (LIPITOR) 10 MG tablet TAKE 1 TABLET DAILY 90 tablet 2  . azelastine (ASTELIN) 0.1 % nasal spray Place 2 sprays at bedtime as needed into both nostrils for rhinitis. Use in each nostril as directed    . budesonide-formoterol (SYMBICORT) 160-4.5 MCG/ACT inhaler Inhale 2 puffs into the lungs 2 (two) times daily. 3 Inhaler 2  . Calcium Carbonate-Vitamin D 600-400 MG-UNIT per tablet Take 1 tablet by mouth daily.     . ciprofloxacin (CIPRO) 500 MG tablet Take 1 tablet (500 mg total) by mouth every 12 (twelve) hours. 10 tablet 0  . famotidine (PEPCID) 20 MG tablet One at bedtime 90  tablet 3  . ferrous sulfate 325 (65 FE) MG tablet Take 325 mg by mouth 2 (two) times daily with a meal.     . FLUoxetine (PROZAC) 40 MG capsule TAKE 1 CAPSULE DAILY 90 capsule 3  . furosemide (LASIX) 80 MG tablet Take 1 tablet in the morning and 1 tablet in the afternoon 225 tablet 2  . glucosamine-chondroitin 500-400 MG tablet Take 1 tablet by mouth every morning.     Marland Kitchen KLOR-CON M20 20 MEQ tablet Take 1 tablet (20 mEq total) by mouth 2 (two) times daily. 180 tablet 2  . levalbuterol (XOPENEX) 1.25 MG/3ML nebulizer solution Take 1.25 mg by nebulization every 4 (four) hours. (Patient taking differently: Take 1.25 mg by nebulization every 4 (four) hours as needed for wheezing or shortness of breath. ) 360 mL 11  . levocetirizine (XYZAL) 5 MG tablet Take 1 tablet (5  mg total) by mouth every evening. 30 tablet 5  . levothyroxine (SYNTHROID, LEVOTHROID) 25 MCG tablet Take 1 tablet (25 mcg total) by mouth daily before breakfast. 90 tablet 1  . metoprolol tartrate (LOPRESSOR) 25 MG tablet Take 1 tablet (25 mg total) by mouth 2 (two) times daily. 180 tablet 0  . montelukast (SINGULAIR) 10 MG tablet Take 1 tablet (10 mg total) by mouth daily. 90 tablet 3  . ondansetron (ZOFRAN) 4 MG tablet Take 1 tablet (4 mg total) by mouth every 8 (eight) hours as needed for nausea or vomiting. 10 tablet 0  . OXYGEN Inhale 2.5 L/min into the lungs continuous. And 4 with exertion    . pantoprazole (PROTONIX) 40 MG tablet Take 1 tablet (40 mg total) by mouth daily. Take 30-60 min before first meal of the day 90 tablet 3  . PROAIR HFA 108 (90 Base) MCG/ACT inhaler USE 2 INHALATIONS EVERY 6 HOURS AS NEEDED FOR WHEEZING OR SHORTNESS OF BREATH 3 Inhaler 1  . propafenone (RYTHMOL SR) 325 MG 12 hr capsule Take 1 capsule (325 mg total) by mouth 2 (two) times daily. 180 capsule 3  . sodium chloride (OCEAN) 0.65 % SOLN nasal spray Place 2 sprays every 4 (four) hours as needed into both nostrils for congestion.    Marland Kitchen umeclidinium  bromide (INCRUSE ELLIPTA) 62.5 MCG/INH AEPB Inhale 1 puff into the lungs daily. 1 each 6   No current facility-administered medications on file prior to visit.    Review of Systems  Constitutional: Negative for other unusual diaphoresis or sweats HENT: Negative for ear discharge or swelling Eyes: Negative for other worsening visual disturbances Respiratory: Negative for stridor or other swelling  Gastrointestinal: Negative for worsening distension or other blood Genitourinary: Negative for retention or other urinary change Musculoskeletal: Negative for other MSK pain or swelling Skin: Negative for color change or other new lesions Neurological: Negative for worsening tremors and other numbness  Psychiatric/Behavioral: Negative for worsening agitation or other fatigue All other system neg per pt    Objective:   Physical Exam BP 140/82   Pulse 94   Temp 97.8 F (36.6 C) (Oral)   Ht _0  (1.575 m)   Wt 251 lb (113.9 kg)   SpO2 97%   BMI 45.91 kg/m  VS noted,  Constitutional: Pt appears in NAD HENT: Head: NCAT.  Right Ear: External ear normal.  Left Ear: External ear normal.  Eyes: . Pupils are equal, round, and reactive to light. Conjunctivae and EOM are normal Nose: without d/c or deformity Neck: Neck supple. Gross normal ROM Cardiovascular: Normal rate and regular rhythm.   Pulmonary/Chest: Effort normal and breath sounds without rales or wheezing.  Abd:  Soft, NT, ND, + BS, no organomegaly Neurological: Pt is alert. At baseline orientation, motor grossly intact Skin: Skin is warm. No rashes, other new lesions, no LE edema Psychiatric: Pt behavior is normal without agitation  No other exam findings Lab Results  Component Value Date   WBC 6.4 10/27/2017   HGB 11.8 (L) 10/27/2017   HCT 34.6 (L) 10/27/2017   PLT 213.0 10/27/2017   GLUCOSE 131 (H) 10/27/2017   CHOL 146 10/27/2017   TRIG 159.0 (H) 10/27/2017   HDL 35.00 (L) 10/27/2017   LDLDIRECT 140.2 04/10/2010    LDLCALC 79 10/27/2017   ALT 10 10/27/2017   AST 11 10/27/2017   NA 144 10/27/2017   K 4.0 10/27/2017   CL 103 10/27/2017   CREATININE 0.77 10/27/2017   BUN 15  10/27/2017   CO2 33 (H) 10/27/2017   TSH 4.59 (H) 11/08/2016   INR 1.15 10/07/2016   HGBA1C 4.6 10/27/2017       Assessment & Plan:

## 2017-10-28 ENCOUNTER — Ambulatory Visit: Payer: BLUE CROSS/BLUE SHIELD | Admitting: Internal Medicine

## 2017-10-31 ENCOUNTER — Encounter: Payer: Self-pay | Admitting: Internal Medicine

## 2017-10-31 ENCOUNTER — Ambulatory Visit (INDEPENDENT_AMBULATORY_CARE_PROVIDER_SITE_OTHER): Payer: BLUE CROSS/BLUE SHIELD | Admitting: Internal Medicine

## 2017-10-31 ENCOUNTER — Ambulatory Visit: Payer: BLUE CROSS/BLUE SHIELD | Admitting: Cardiology

## 2017-10-31 VITALS — BP 132/72 | HR 84 | Ht 62.0 in | Wt 252.4 lb

## 2017-10-31 DIAGNOSIS — J9611 Chronic respiratory failure with hypoxia: Secondary | ICD-10-CM

## 2017-10-31 DIAGNOSIS — J9612 Chronic respiratory failure with hypercapnia: Secondary | ICD-10-CM

## 2017-10-31 DIAGNOSIS — J449 Chronic obstructive pulmonary disease, unspecified: Secondary | ICD-10-CM

## 2017-10-31 NOTE — Patient Instructions (Signed)
No change in medications   See [name] NP w/in 4 weeks with  [place]  all your medications, even over the counter meds, separated in two separate bags, the ones you take no matter what vs the ones you stop once you feel better and take only as needed when you feel you need them.   [name]  will generate for you a new user friendly medication calendar that will put [place] all on the same page re: your medication use.     Without this process, it simply isn't possible to assure that we are providing  your outpatient care  with  the attention to detail we feel you deserve.   If we cannot assure that you're getting that kind of care,  then we cannot manage your problem effectively from this clinic.  Once you have seen [name] and we are sure that we're all on the same page with your medication use she will arrange follow up with me.

## 2017-10-31 NOTE — Progress Notes (Signed)
Subjective:    Patient ID: [name] [name], female    DOB: Mar 06, 1950.   MRN: [id number]    Brief patient profile:  67yowf last smoked 2003 with Morbid obesity/ COPD GOLD II  with minimum asthmatic component documented 07/25/06. On 02 24 hours per day at 3lpm       History of Present Illness  11/26/2014  f/u ov/[name] re: copd/ severe obesity /uacs  - has med calendar, not really using the action plans at the bottom Chief Complaint  Patient presents with  . Follow-up    Pt c/o increased cough for the past few months non prod.  She states "feels like I am downding in fluid".     On 2-2.5  24/7 able to do [employer] with 02 in a basket  sleep ok/ sensation of pnds day > noct   rec For drainage / throat tickle try take CHLORPHENIRAMINE  4 mg - take one every 4 hours as needed See calendar for specific medication instructions    04/26/2017  f/u ov/[name] re:  GOLD II but 02 dep resp failure / rhinitis/ /MO  - brought med calendar and some meds Chief Complaint  Patient presents with  . Follow-up    headaches, increased non-productive cough, increased SOB, some chest tightness. Reports inhalers are effective.   Dyspnea:  MMRC3 = can't walk 100 yards even at a slow [name] at a flat grade s stopping due to sob  Even on 4lpm  Cough: dry daytime  Sleep: on 2.5 lpm and sev pillows  SABA use:  Maybe once a day neb > proair HA frontal x 1 week not worse in am attributes to outdoor exposures to pollen assoc with sneezing/ watery rhinorrhea rec If not improving >  Prednisone 10 mg take  4 each am x 2 days,   2 each am x 2 days,  1 each am x 2 days and stop  Follow the med calendar with the new action plan off adding afrin x 5 days a few minutes before your flonase / astelin     06/30/17 NP instructions Will switch patient to [place] based off of formulary change today >>> 1 puff daily  Okay for you to finish Spiriva Respimat that you have at home, then start Incruse Ellipta >>> Do not  combine these inhalers    10/31/2017  f/u ov/[name] re: copd / chronic resp failure / no med calendar/ confusing with symb vs spiriva/ incruse instructions  Chief Complaint  Patient presents with  . Follow-up    COPD GOLD II    Dyspnea:  Breathing gives out > 50 ft but so does back "about the same time)  Cough: none now Sleeping: bed flat/ 2pillows and 4lpm  SABA use: rare  02: 4lpm ,  Sometimes drops to two at rest but not monitoring sats    No obvious day to day or daytime variability or assoc excess/ purulent sputum or mucus plugs or hemoptysis or cp or chest tightness, subjective wheeze or overt sinus or hb symptoms.   Sleeping as above  without nocturnal  or early am exacerbation  of respiratory  c/o's or need for noct saba. Also denies any obvious fluctuation of symptoms with weather or environmental changes or other aggravating or alleviating factors except as outlined above   No unusual exposure hx or h/o childhood pna/ asthma or knowledge of premature birth.  Current Allergies, Complete Past Medical History, Past Surgical History, Family History, and Social History were  reviewed in [facility] record.  ROS  The following are not active complaints unless bolded Hoarseness, sore throat, dysphagia, dental problems, itching, sneezing,  nasal congestion or discharge of excess mucus or purulent secretions, ear ache,   fever, chills, sweats, unintended wt loss or wt gain, classically pleuritic or exertional cp,  orthopnea pnd or arm/hand swelling  or leg swelling, presyncope, palpitations, abdominal pain, anorexia, nausea, vomiting, diarrhea  or change in bowel habits or change in bladder habits, change in stools or change in urine, dysuria, hematuria,  rash, arthralgias, visual complaints, headache, numbness, weakness or ataxia or problems with walking or coordination,  change in mood or  memory.        Current Meds  Medication Sig  . albuterol (PROAIR HFA) 108  (90 Base) MCG/ACT inhaler Inhale 2 puffs into the lungs every 6 (six) hours as needed for wheezing or shortness of breath.  Marland Kitchen apixaban (ELIQUIS) 5 MG TABS tablet Take 1 tablet (5 mg total) by mouth 2 (two) times daily.  Marland Kitchen atorvastatin (LIPITOR) 10 MG tablet TAKE 1 TABLET DAILY  . azelastine (ASTELIN) 0.1 % nasal spray Place 2 sprays at bedtime as needed into both nostrils for rhinitis. Use in each nostril as directed  . budesonide-formoterol (SYMBICORT) 160-4.5 MCG/ACT inhaler Inhale 2 puffs into the lungs 2 (two) times daily.  . Calcium Carbonate-Vitamin D 600-400 MG-UNIT per tablet Take 1 tablet by mouth daily.   . ciprofloxacin (CIPRO) 500 MG tablet Take 1 tablet (500 mg total) by mouth every 12 (twelve) hours.  . famotidine (PEPCID) 20 MG tablet One at bedtime  . ferrous sulfate 325 (65 FE) MG tablet Take 325 mg by mouth 2 (two) times daily with a meal.   . FLUoxetine (PROZAC) 40 MG capsule TAKE 1 CAPSULE DAILY  . furosemide (LASIX) 80 MG tablet Take 1 tablet in the morning and 1 tablet in the afternoon  . glucosamine-chondroitin 500-400 MG tablet Take 1 tablet by mouth every morning.   Marland Kitchen KLOR-CON M20 20 MEQ tablet Take 1 tablet (20 mEq total) by mouth 2 (two) times daily.  Marland Kitchen levalbuterol (XOPENEX) 1.25 MG/3ML nebulizer solution Take 1.25 mg by nebulization every 4 (four) hours. (Patient taking differently: Take 1.25 mg by nebulization every 4 (four) hours as needed for wheezing or shortness of breath. )  . levocetirizine (XYZAL) 5 MG tablet Take 1 tablet (5 mg total) by mouth every evening.  Marland Kitchen levothyroxine (SYNTHROID, LEVOTHROID) 25 MCG tablet Take 1 tablet (25 mcg total) by mouth daily before breakfast.  . meclizine (ANTIVERT) 12.5 MG tablet Take 1 tablet (12.5 mg total) by mouth 3 (three) times daily as needed for dizziness.  . metoprolol tartrate (LOPRESSOR) 25 MG tablet Take 1 tablet (25 mg total) by mouth 2 (two) times daily.  . montelukast (SINGULAIR) 10 MG tablet Take 1 tablet (10 mg  total) by mouth daily.  . OXYGEN Inhale 2.5 L/min into the lungs continuous. And 4 with exertion  . pantoprazole (PROTONIX) 40 MG tablet Take 1 tablet (40 mg total) by mouth daily. Take 30-60 min before first meal of the day  . PROAIR HFA 108 (90 Base) MCG/ACT inhaler USE 2 INHALATIONS EVERY 6 HOURS AS NEEDED FOR WHEEZING OR SHORTNESS OF BREATH  . propafenone (RYTHMOL SR) 325 MG 12 hr capsule Take 1 capsule (325 mg total) by mouth 2 (two) times daily.  . sodium chloride (OCEAN) 0.65 % SOLN nasal spray Place 2 sprays every 4 (four) hours as needed into both  nostrils for congestion.  . [DISCONTINUED] ondansetron (ZOFRAN) 4 MG tablet Take 1 tablet (4 mg total) by mouth every 8 (eight) hours as needed for nausea or vomiting.                      Past Medical History:  CHRONIC RHINITIS (ICD-472.0)  EDEMA (ICD-782.3)  ACUTE AND CHRONIC RESPIRATORY FAILURE (ICD-518.84)  MORBID OBESITY (ICD-278.01)  - Target wt = 179 for BMI < 30 peak wt 282  - Referred back to nutrition December 18, 2007  DEEP VENOUS THROMBOPHLEBITIS, HX OF (ICD-V12.52)  COPD (ICD-496)..........................................................Marland KitchenWert  - PFTs 07/25/06 FEV1 55% ratio 53, DLC0 51%  - HFA 50% December 16, 2009 > 75% March 27, 2010  DEPRESSION (ICD-311)  OSTEOPOROSIS (ICD-733.00) last BMD 4/08 -2.4, intolerant of bisphosphonates  HYPERTENSION (ICD-401.9)  Atrial fibrillation - paroxysmal  Anticoagulation therapy  Pulmonary embolism, hx of  neg stress myoview 10/07  Anxiety  glucose intolerance - on steroids  Hyperlipidemia  Complex medical regimen            Objective:   Physical Exam      Wt  274 09/01/10 >   09/29/2010  275 > 01/01/2011  280 > 04/29/2011  279 > 285 11/17/2011  > 288 02/18/2012 > 281 04/19/2012 > 03/13/2013  282 >  09/11/2013  282 >  11/26/2014  259 > 11/27/2015  273 > 02/02/2016   272 > 05/04/2016   261>  10/27/2016  260> 01/26/2017   259 > 03/10/2017   260 > 04/26/2017   258 >  10/31/2017  252    amb obese wf nad using rollator /  on 4lpm  POC     HEENT: nl dentition / oropharynx. Nl external ear canals without cough reflex -  Mild bilateral non-specific turbinate edema     NECK :  without JVD/Nodes/TM/ nl carotid upstrokes bilaterally   LUNGS: no acc muscle use,  Mild barrel  contour chest wall with bilateral  Distant bs s audible wheeze and  without cough on insp or exp maneuver and mild  Hyperresonant  to  percussion bilaterally     CV:  RRR  no s3 or murmur or increase in P2, and no edema   ABD:  soft and nontender with poor excursion   in the supine position. No bruits or organomegaly appreciated, bowel sounds nl  MS:   Slow gait using rollator/  ext warm without deformities, calf tenderness, cyanosis or clubbing No obvious joint restrictions   SKIN: warm and dry without lesions    NEURO:  alert, approp, nl sensorium with  no motor or cerebellar deficits apparent.

## 2017-11-01 ENCOUNTER — Encounter: Payer: Self-pay | Admitting: Cardiology

## 2017-11-01 ENCOUNTER — Encounter: Payer: Self-pay | Admitting: Internal Medicine

## 2017-11-01 ENCOUNTER — Ambulatory Visit (INDEPENDENT_AMBULATORY_CARE_PROVIDER_SITE_OTHER): Payer: BLUE CROSS/BLUE SHIELD | Admitting: Cardiology

## 2017-11-01 VITALS — BP 141/78 | HR 67 | Ht 62.0 in | Wt 253.2 lb

## 2017-11-01 DIAGNOSIS — I480 Paroxysmal atrial fibrillation: Secondary | ICD-10-CM

## 2017-11-01 DIAGNOSIS — I10 Essential (primary) hypertension: Secondary | ICD-10-CM | POA: Diagnosis not present

## 2017-11-01 DIAGNOSIS — I5032 Chronic diastolic (congestive) heart failure: Secondary | ICD-10-CM

## 2017-11-01 DIAGNOSIS — I350 Nonrheumatic aortic (valve) stenosis: Secondary | ICD-10-CM | POA: Diagnosis not present

## 2017-11-01 NOTE — Patient Instructions (Signed)

## 2017-11-01 NOTE — Assessment & Plan Note (Signed)
-   PFTs 07/25/06 FEV1 55% ratio 53, DLC0 51%  -  PFT's 04/19/2012 FEV1 50% (ratio 51) with 19% improvement p B2, dlco 42 > 99% corrected  - med calendar 01/17/2012  -Tudorza trial  01/17/12 > permanently d/c 11/27/2015 due to uacs  - 01/26/2017  After extensive coaching HFA effectiveness =    75% (short Ti) change to spiriva 1.25 one puff each am (higher doses not tol)   - 10/31/2017 changed spiriva to incruse due to insurance   She got confused with instruction to substitute spiriva with incruse and has of today not tried it yet but we may see flare of uacs again on the dpi so spent extra time sorting this all out today:  - The proper method of use, as well as anticipated side effects, of a metered-dose inhaler are discussed and demonstrated to the patient.      Each maintenance medication was reviewed in detail including most importantly the difference between maintenance and as needed and under what circumstances the prns are to be used. This was done in the context of a medication calendar review which provided the patient with a user-friendly unambiguous mechanism for medication administration and reconciliation and provides an action plan for all active problems. It is critical that this be shown to every doctor  for modification during the office visit if necessary so the patient can use it as a working document.

## 2017-11-01 NOTE — Assessment & Plan Note (Signed)
-   09/29/10  Walked 3lpm  2 laps @ 185 ft each stopped due to  Sob with sats 90%     - HC03 11/08/11 = 34     - 11-17-11--o2 sat on ra at rest 83%ra     - 02/18/2012  Sat 88% RA across the room > Walked 3lpm x 3 laps @ 185 ft each stopped due to  End of study no desat     - 04/19/2012  Walked 3lpm x 3 laps @ 185 ft each stopped due to  End of study, sats dropped to 88 at very end  - HC03  34    08/01/15  - 05/04/2016 Patient Saturations on Room Air at Rest = 86% Patient Saturations on 4 Liters of oxygen while Ambulating = 90% Please briefly explain why patient needs home oxygen: Pt oxygen drops while walking. - 10/27/2016   Walked 4lpm POC  x one lap @ 185 stopped due to  Sob with desat 87% slow pace  - HC03  10/27/17 =  33   C/w component of OHS    As of 10/31/2017 rec 2  lpm at rest and up to  4lpm pulsed walking(highest setting)    Adequate control on present rx, reviewed in detail with pt > no change in rx needed

## 2017-11-01 NOTE — Assessment & Plan Note (Addendum)
Body mass index is 46.16 kg/m.  -  trending down slightly / encouraged Lab Results  Component Value Date   TSH 4.59 (H) 11/08/2016     Contributing to gerd risk/ doe/reviewed the need and the process to achieve and maintain neg calorie balance > defer f/u primary care including intermittently monitoring thyroid status         I had an extended discussion with the patient reviewing all relevant studies completed to date and  lasting 15 to 20 minutes of a 25 minute visit    See device teaching which extended face to face time for this visit.  Reviewed: Formulary restrictions will be an ongoing challenge for the forseable future and I would be happy to pick an alternative if the pt will first  provide me a list of them but pt  will need to return here for training for any new device that is required eg dpi vs hfa vs respimat.    In meantime we can always provide samples so the patient never runs out of any needed respiratory medications.      Please see AVS for specific instructions unique to this visit that I personally wrote and verbalized to the the pt in detail and then reviewed with pt  by my nurse highlighting any  changes in therapy recommended at today's visit to their plan of care.

## 2017-11-24 ENCOUNTER — Other Ambulatory Visit: Payer: Self-pay | Admitting: Physician Assistant

## 2017-12-05 ENCOUNTER — Encounter: Payer: Self-pay | Admitting: Adult Health

## 2017-12-05 ENCOUNTER — Ambulatory Visit (INDEPENDENT_AMBULATORY_CARE_PROVIDER_SITE_OTHER): Payer: BLUE CROSS/BLUE SHIELD | Admitting: Adult Health

## 2017-12-05 DIAGNOSIS — J9611 Chronic respiratory failure with hypoxia: Secondary | ICD-10-CM

## 2017-12-05 DIAGNOSIS — J449 Chronic obstructive pulmonary disease, unspecified: Secondary | ICD-10-CM | POA: Diagnosis not present

## 2017-12-05 DIAGNOSIS — I5032 Chronic diastolic (congestive) heart failure: Secondary | ICD-10-CM | POA: Diagnosis not present

## 2017-12-05 DIAGNOSIS — J9612 Chronic respiratory failure with hypercapnia: Secondary | ICD-10-CM

## 2017-12-05 NOTE — Assessment & Plan Note (Signed)
Continue on oxygen 

## 2017-12-05 NOTE — Progress Notes (Signed)
@Patient  ID: [name] [name], female    DOB: Mar 22, 1950, 67 y.o.   MRN: [id number]  Chief Complaint  Patient presents with  . Follow-up    COPD    Referring provider: Biagio Borg, MD  HPI: 29 yowf last smoked 2003 with Morbid obesity/ COPD GOLD II with minimum asthmatic component documented 07/25/06. On 02 24 hours per day at 3lpm   TEST/EVENTS :   - PFTs 07/25/06 FEV1 55% ratio 53, DLC0 51%  -  PFT's 04/19/2012 FEV1 50% (ratio 51) with 19% improvement p B2, dlco 42 > 99% corrected   12/05/2017 Follow up : COPD , O2 RF , Med Review  Patient returns for a 6-week follow-up.  Patient has underlying severe COPD oxygen dependent.  She remains on Incruse daily.  Had previously been on Symbicort.  But this was stopped due to increased tremor.  She says her tremor never really changed.  But has not had a flare of her cough or shortness of breath off of Symbicort. She is switching insurances in January 2020 and says that she will need her increase changed to an alternative.  She was previously on Spiriva and tolerated well.  We reviewed all her medications and organize them into a medication calendar with patient education    Allergies  Allergen Reactions  . Duloxetine Other (See Comments)    REACTION: rhabdomyolysis  . Penicillins Other (See Comments)    SYNCOPE Has patient had a PCN reaction causing immediate rash, facial/tongue/throat swelling, SOB or lightheadedness with hypotension: Yes Has patient had a PCN reaction causing severe rash involving mucus membranes or skin necrosis: No Has patient had a PCN reaction that required hospitalization No Has patient had a PCN reaction occurring within the last 10 years: No If all of the above answers are "NO", then may proceed with Cephalosporin use.   . Latex Rash    Immunization History  Administered Date(s) Administered  . Influenza Split 10/14/2010, 10/13/2011  . Influenza Whole 10/02/2007, 10/14/2008, 10/16/2009  . Influenza,  High Dose Seasonal PF 10/09/2016, 10/27/2017  . Influenza, Seasonal, Injecte, Preservative Fre 11/01/2013  . Influenza,inj,Quad PF,6+ Mos 10/17/2012, 10/17/2015  . Pneumococcal Conjugate-13 10/17/2012, 01/03/2013  . Pneumococcal Polysaccharide-23 09/12/2006, 07/27/2016  . Td 12/05/2007  . Zoster 10/14/2010    Past Medical History:  Diagnosis Date  . Anemia    hx  . Anxiety   . Arthritis   . Asthma   . Atrial fibrillation and flutter ([place])   . Atypical lobular hyperplasia of right breast 05/09/2015  . Breast CA ([place])    ?  Marland Kitchen Chronic rhinitis   . COPD (chronic obstructive pulmonary disease) ([place])    Wert. PFTs 07/25/06 FEV1 55% ratio 53, DLC0 51% HFA 50% 12/16/2009  . Depression   . FRACTURE, RIB, RIGHT 06/18/2009  . Glucose intolerance (impaired glucose tolerance)    on steroids  . History of DVT (deep vein thrombosis)   . Hx of cardiac catheterization    LHC (01/2001):  Normal Cors.  EF 65%.;  LexiScan Myoview (03/2013): No ischemia, EF 61%, normal  . Hx of echocardiogram    Echo (11/2011):  Mod LVH, EF 60-65%, no RWMA, MAC, mild LAE, PASP 34.  Marland Kitchen HYPERLIPIDEMIA   . Hypertension   . Hypothyroidism   . Morbid obesity ([place])    target wt=179lb for BMI<30 Peak wt 282lb  . Osteoporosis    last BMD 4/08 -2.4, intolerant of bisphosphonates  . Peripheral vascular disease ([place])  hx dvt.pe  . Pneumonia    hx  . PONV (postoperative nausea and vomiting)   . PREMATURE VENTRICULAR CONTRACTIONS   . PULMONARY EMBOLISM, HX OF 06/19/2007  . Recurrent upper respiratory infection (URI)   . Rhabdomyolysis 07/29/2009  . Tremor   . VITAMIN D DEFICIENCY     Tobacco History: Social History   Tobacco Use  Smoking Status Former Smoker  . Packs/day: 1.00  . Years: 35.00  . Pack years: 35.00  . Types: Cigarettes  . Last attempt to quit: 01/11/2006  . Years since quitting: 11.9  Smokeless Tobacco Never Used   Counseling given: Not Answered   Outpatient Medications Prior to Visit    Medication Sig Dispense Refill  . apixaban (ELIQUIS) 5 MG TABS tablet Take 1 tablet (5 mg total) by mouth 2 (two) times daily. 180 tablet 1  . atorvastatin (LIPITOR) 10 MG tablet TAKE 1 TABLET DAILY 90 tablet 2  . azelastine (ASTELIN) 0.1 % nasal spray Place 2 sprays at bedtime as needed into both nostrils for rhinitis. Use in each nostril as directed    . Calcium Carbonate-Vitamin D 600-400 MG-UNIT per tablet Take 1 tablet by mouth daily.     . famotidine (PEPCID) 20 MG tablet One at bedtime 90 tablet 3  . ferrous sulfate 325 (65 FE) MG tablet Take 325 mg by mouth 2 (two) times daily with a meal.     . FLUoxetine (PROZAC) 40 MG capsule TAKE 1 CAPSULE DAILY 90 capsule 3  . furosemide (LASIX) 80 MG tablet Take 1 tablet in the morning and 1 tablet in the afternoon 225 tablet 2  . glucosamine-chondroitin 500-400 MG tablet Take 1 tablet by mouth every morning.     Marland Kitchen KLOR-CON M20 20 MEQ tablet Take 1 tablet (20 mEq total) by mouth 2 (two) times daily. 180 tablet 2  . levalbuterol (XOPENEX) 1.25 MG/3ML nebulizer solution Take 1.25 mg by nebulization every 4 (four) hours. (Patient taking differently: Take 1.25 mg by nebulization every 4 (four) hours as needed for wheezing or shortness of breath. ) 360 mL 11  . levocetirizine (XYZAL) 5 MG tablet Take 1 tablet (5 mg total) by mouth every evening. 30 tablet 5  . levothyroxine (SYNTHROID, LEVOTHROID) 25 MCG tablet Take 1 tablet (25 mcg total) by mouth daily before breakfast. 90 tablet 1  . meclizine (ANTIVERT) 12.5 MG tablet Take 1 tablet (12.5 mg total) by mouth 3 (three) times daily as needed for dizziness. 30 tablet 1  . metoprolol tartrate (LOPRESSOR) 25 MG tablet Take 1 tablet (25 mg total) by mouth 2 (two) times daily. 180 tablet 0  . montelukast (SINGULAIR) 10 MG tablet Take 1 tablet (10 mg total) by mouth daily. 90 tablet 3  . OXYGEN Inhale 2.5 L/min into the lungs continuous. And 4 with exertion    . pantoprazole (PROTONIX) 40 MG tablet Take 1  tablet (40 mg total) by mouth daily. Take 30-60 min before first meal of the day 90 tablet 3  . PROAIR HFA 108 (90 Base) MCG/ACT inhaler USE 2 INHALATIONS EVERY 6 HOURS AS NEEDED FOR WHEEZING OR SHORTNESS OF BREATH 3 Inhaler 1  . propafenone (RYTHMOL SR) 325 MG 12 hr capsule TAKE 1 CAPSULE TWICE A DAY 180 capsule 0  . sodium chloride (OCEAN) 0.65 % SOLN nasal spray Place 2 sprays every 4 (four) hours as needed into both nostrils for congestion.    Marland Kitchen umeclidinium bromide (INCRUSE ELLIPTA) 62.5 MCG/INH AEPB Inhale 1 puff into the lungs daily.  1 each 6   No facility-administered medications prior to visit.      Review of Systems  Constitutional:   No  weight loss, night sweats,  Fevers, chills,  +fatigue, or  lassitude.  HEENT:   No headaches,  Difficulty swallowing,  Tooth/dental problems, or  Sore throat,                No sneezing, itching, ear ache, nasal congestion, post nasal drip,   CV:  No chest pain,  Orthopnea, PND, swelling in lower extremities, anasarca, dizziness, palpitations, syncope.   GI  No heartburn, indigestion, abdominal pain, nausea, vomiting, diarrhea, change in bowel habits, loss of appetite, bloody stools.   Resp:   No chest wall deformity  Skin: no rash or lesions.  GU: no dysuria, change in color of urine, no urgency or frequency.  No flank pain, no hematuria   MS:  No joint pain or swelling.  No decreased range of motion.  No back pain.    Physical Exam  BP 116/74 (BP Location: Left Arm, Cuff Size: Large)   Pulse 78   Ht 5' 2"  (1.575 m)   Wt 255 lb (115.7 kg)   SpO2 97%   BMI 46.64 kg/m   GEN: A/Ox3; pleasant , NAD, obese , elderly , on O2    HEENT:  [place]/AT,  EACs-clear, TMs-wnl, NOSE-clear, THROAT-clear, no lesions, no postnasal drip or exudate noted.   NECK:  Supple w/ fair ROM; no JVD; normal carotid impulses w/o bruits; no thyromegaly or nodules palpated; no lymphadenopathy.    RESP decreased breath sounds in the bases, wheezes/ rales/ or  rhonchi. no accessory muscle use, no dullness to percussion  CARD:  RRR, no m/r/g, tr  peripheral edema, pulses intact, no cyanosis or clubbing.  GI:   Soft & nt; nml bowel sounds; no organomegaly or masses detected.   Musco: Warm bil, no deformities or joint swelling noted.   Neuro: alert, no focal deficits noted.    Skin: Warm, no lesions or rashes    Lab Results:  CBC    Component Value Date/Time   WBC 6.4 10/27/2017 1054   RBC 4.06 10/27/2017 1054   HGB 11.8 (L) 10/27/2017 1054   HCT 34.6 (L) 10/27/2017 1054   PLT 213.0 10/27/2017 1054   MCV 85.3 10/27/2017 1054   MCH 29.5 10/07/2017 1126   MCHC 34.2 10/27/2017 1054   RDW 16.4 (H) 10/27/2017 1054   LYMPHSABS 1.7 10/27/2017 1054   MONOABS 0.5 10/27/2017 1054   EOSABS 0.1 10/27/2017 1054   BASOSABS 0.0 10/27/2017 1054    BMET    Component Value Date/Time   NA 144 10/27/2017 1054   K 4.0 10/27/2017 1054   CL 103 10/27/2017 1054   CO2 33 (H) 10/27/2017 1054   GLUCOSE 131 (H) 10/27/2017 1054   BUN 15 10/27/2017 1054   CREATININE 0.77 10/27/2017 1054   CREATININE 0.72 08/01/2015 0935   CALCIUM 9.4 10/27/2017 1054   GFRNONAA >60 10/07/2017 1126   GFRNONAA 81 09/25/2013 1034   GFRAA >60 10/07/2017 1126   GFRAA >89 09/25/2013 1034    BNP    Component Value Date/Time   BNP 98.4 09/14/2016 1542   BNP 108.1 (H) 11/19/2014 1029    ProBNP    Component Value Date/Time   PROBNP 120.0 (H) 03/12/2010 1105    Imaging: No results found.    No flowsheet data found.  No results found for: NITRICOXIDE      Assessment & Plan:  COPD GOLD II Compensated on present regimen.  Will need to change increase to Spiriva January 2020 due to insurance coverage.  Patient's medications were reviewed today and patient education was given. Computerized medication calendar was adjusted/completed   Patient would like to be transferred to her [facility] location as it is closer for her at home  Chronic diastolic CHF  (congestive heart failure) ([place]) Compensated without evidence of volume overload on exam continue on current regimen.  Chronic respiratory failure with hypoxia and hypercapnia ([facility]) Continue on oxygen     Rexene Edison, NP 12/05/2017

## 2017-12-05 NOTE — Assessment & Plan Note (Signed)
Compensated on present regimen.  Will need to change increase to Spiriva January 2020 due to insurance coverage.  Patient's medications were reviewed today and patient education was given. Computerized medication calendar was adjusted/completed   Patient would like to be transferred to her [place] location as it is closer for her at home

## 2017-12-05 NOTE — Patient Instructions (Signed)
Continue on current regimen .  Follow  med calendar closely and bring to each visit.  Call in January , will switch INCRUSE to Spiriva if covered by new insurance.  Refer to [facility] in [place] for follow up in 3 months .  Please contact office for sooner follow up if symptoms do not improve or worsen or seek emergency care.

## 2017-12-05 NOTE — Assessment & Plan Note (Addendum)
Compensated without evidence of volume overload on exam continue on current regimen.

## 2017-12-06 NOTE — Progress Notes (Signed)
Chart and office note reviewed in detail  > agree with a/p as outlined    

## 2017-12-13 ENCOUNTER — Telehealth: Payer: Self-pay | Admitting: Internal Medicine

## 2017-12-13 NOTE — Telephone Encounter (Signed)
Spoke with pt, she would like a copy of her med calendar. She stated she was supposed to have one mailed to her but never received it. Janett Billow do you remember sending her a copy in the mail?

## 2017-12-14 NOTE — Telephone Encounter (Signed)
Ok I will close encounter.

## 2017-12-14 NOTE — Telephone Encounter (Signed)
Yes, this was placed in outgoing mail on 11/26 - suspect the delay is from the holidays and perhaps now that we do not have an actual mail room here on site.  So sorry that she hasn't received it yet.  Another copy has been placed in outgoing mail now.  Thank you.

## 2017-12-22 ENCOUNTER — Other Ambulatory Visit: Payer: Self-pay | Admitting: Cardiology

## 2017-12-22 MED ORDER — APIXABAN 5 MG PO TABS: 5.0000 mg | ORAL_TABLET | Freq: Two times a day (BID) | ORAL | 3 refills | Status: DC

## 2017-12-22 NOTE — Telephone Encounter (Signed)
 *  STAT* If patient is at the pharmacy, call can be transferred to refill team.   1. Which medications need to be refilled? (please list name of each medication and dose if known) apixaban (ELIQUIS) 5 MG TABS tablet  2. Which pharmacy/location (including street and city if local pharmacy) is medication to be sent to?[facility]  3. Do they need a 30 day or 90 day supply? [place]

## 2017-12-22 NOTE — Telephone Encounter (Signed)
Eliquis 90 day refill sent to pharmacy.

## 2018-01-16 ENCOUNTER — Telehealth: Payer: Self-pay

## 2018-01-16 NOTE — Telephone Encounter (Signed)
Noted Pharmacy has been updated.     Copied from [place] [phone]. Topic: General - Other >> Jan 16, 2018  3:05 PM Windy Kalata wrote: Reason for CRM: Patient called to state that going forward all her medication refills need to go through [facility].  Best call back is [phone]

## 2018-01-25 ENCOUNTER — Ambulatory Visit (INDEPENDENT_AMBULATORY_CARE_PROVIDER_SITE_OTHER): Payer: Medicare Other | Admitting: Internal Medicine

## 2018-01-25 ENCOUNTER — Encounter: Payer: Self-pay | Admitting: Internal Medicine

## 2018-01-25 ENCOUNTER — Encounter

## 2018-01-25 VITALS — BP 130/60 | HR 60 | Temp 97.9°F | Ht 62.0 in | Wt 250.0 lb

## 2018-01-25 DIAGNOSIS — R059 Cough, unspecified: Secondary | ICD-10-CM

## 2018-01-25 DIAGNOSIS — I10 Essential (primary) hypertension: Secondary | ICD-10-CM

## 2018-01-25 DIAGNOSIS — R05 Cough: Secondary | ICD-10-CM | POA: Diagnosis not present

## 2018-01-25 DIAGNOSIS — J441 Chronic obstructive pulmonary disease with (acute) exacerbation: Secondary | ICD-10-CM | POA: Diagnosis not present

## 2018-01-25 DIAGNOSIS — R6889 Other general symptoms and signs: Secondary | ICD-10-CM

## 2018-01-25 LAB — POC INFLUENZA A&B (BINAX/QUICKVUE)
Influenza A, POC: NEGATIVE
Influenza B, POC: NEGATIVE

## 2018-01-25 MED ORDER — LEVOFLOXACIN 500 MG PO TABS: 500.0000 mg | ORAL_TABLET | Freq: Every day | ORAL | 0 refills | Status: AC

## 2018-01-25 MED ORDER — METHYLPREDNISOLONE ACETATE 80 MG/ML IJ SUSP
80.0000 mg | Freq: Once | INTRAMUSCULAR | Status: AC
  Administered 2018-01-25: 80 mg via INTRAMUSCULAR

## 2018-01-25 MED ORDER — HYDROCODONE-HOMATROPINE 5-1.5 MG/5ML PO SYRP: 5.0000 mL | ORAL_SOLUTION | Freq: Four times a day (QID) | ORAL | 0 refills | Status: AC | PRN

## 2018-01-25 MED ORDER — PREDNISONE 10 MG PO TABS: ORAL_TABLET | ORAL | 0 refills | Status: DC

## 2018-01-25 NOTE — Assessment & Plan Note (Signed)
Mild to mod, for depomedrol IM 80, predpac asd, cough med prn, cont inhalers,   to f/u any worsening symptoms or concerns

## 2018-01-25 NOTE — Patient Instructions (Signed)
Your Rapid Flu test was negative  Please take all new medication as prescribed - the antibiotic, cough medicine, and prednisone  You had the steroid shot today  Please continue all other medications as before, and refills have been done if requested.  Please have the pharmacy call with any other refills you may need.  Please keep your appointments with your specialists as you may have planned

## 2018-01-25 NOTE — Progress Notes (Signed)
Subjective:    Patient ID: [name], female    DOB: [birth year], 68 y.o.   MRN: [id number]  HPI  Here with sick visit with illness x just over 1 wk not getting better; inlaws have visited and may have been ill at the time;  Had URI symptoms with dizziness to start, but now Here with acute onset mild to mod 2-3 days ST, HA, general weakness and malaise, with prod cough greenish sputum assoc with wheezing increased with mild worsening sob/doe as well as mild dizziness and even diarrhea the first few days, but Pt denies chest pain, orthopnea, PND, increased LE swelling, palpitations, dizziness or syncope.   Pt denies fever, wt loss, night sweats, loss of appetite, or other constitutional symptoms, but thinks she may have influenza despite her flu shot last visit in October.   Pt denies polydipsia, polyuria Past Medical History:  Diagnosis Date  . Anemia    hx  . Anxiety   . Arthritis   . Asthma   . Atrial fibrillation and flutter ([place])   . Atypical lobular hyperplasia of right breast 05/09/2015  . Breast CA ([place])    ?  Marland Kitchen Chronic rhinitis   . COPD (chronic obstructive pulmonary disease) ([place])    [place]. PFTs 07/25/06 FEV1 55% ratio 53, DLC0 51% HFA 50% 12/16/2009  . Depression   . FRACTURE, RIB, RIGHT 06/18/2009  . Glucose intolerance (impaired glucose tolerance)    on steroids  . History of DVT (deep vein thrombosis)   . Hx of cardiac catheterization    LHC (01/2001):  Normal Cors.  EF 65%.;  LexiScan Myoview (03/2013): No ischemia, EF 61%, normal  . Hx of echocardiogram    Echo (11/2011):  Mod LVH, EF 60-65%, no RWMA, MAC, mild LAE, PASP 34.  Marland Kitchen HYPERLIPIDEMIA   . Hypertension   . Hypothyroidism   . Morbid obesity ([place])    target wt=179lb for BMI<30 Peak wt 282lb  . Osteoporosis    last BMD 4/08 -2.4, intolerant of bisphosphonates  . Peripheral vascular disease ([facility])    hx dvt.pe  . Pneumonia    hx  . PONV (postoperative nausea and vomiting)   . PREMATURE VENTRICULAR CONTRACTIONS     . PULMONARY EMBOLISM, HX OF 06/19/2007  . Recurrent upper respiratory infection (URI)   . Rhabdomyolysis 07/29/2009  . Tremor   . VITAMIN D DEFICIENCY    Past Surgical History:  Procedure Laterality Date  . A-FLUTTER ABLATION N/A 10/08/2016   Procedure: A-FLUTTER ABLATION;  Surgeon: Evans Lance, MD;  Location: [facility];  Service: Cardiovascular;  Laterality: N/A;  . ABDOMINAL HYSTERECTOMY    . BREAST LUMPECTOMY WITH RADIOACTIVE SEED LOCALIZATION Right 06/05/2015   Procedure: RIGHT BREAST LUMPECTOMY WITH RADIOACTIVE SEED LOCALIZATION;  Surgeon: Fanny Skates, MD;  Location: [facility];  Service: General;  Laterality: Right;  . BREAST SURGERY Left    s/p mass removal  . CARDIOVERSION N/A 12/11/2014   Procedure: CARDIOVERSION;  Surgeon: Josue Hector, MD;  Location: [facility];  Service: Cardiovascular;  Laterality: N/A;  . CARDIOVERSION N/A 09/15/2016   Procedure: CARDIOVERSION;  Surgeon: Jerline Pain, MD;  Location: [facility];  Service: Cardiovascular;  Laterality: N/A;  . COLONOSCOPY  10/22/2003, ?2006  . COLONOSCOPY WITH PROPOFOL N/A 01/15/2016   Procedure: COLONOSCOPY WITH PROPOFOL;  Surgeon: Gatha Mayer, MD;  Location: [facility];  Service: Endoscopy;  Laterality: N/A;  . ESOPHAGOGASTRODUODENOSCOPY (EGD) WITH PROPOFOL N/A 01/15/2016   Procedure: ESOPHAGOGASTRODUODENOSCOPY (EGD) WITH  PROPOFOL;  Surgeon: Gatha Mayer, MD;  Location: [facility];  Service: Endoscopy;  Laterality: N/A;  . s/p left arm fracture with fall off chair    . skin graft to middle R finger  1975  . TEE WITHOUT CARDIOVERSION N/A 12/11/2014   Procedure: TRANSESOPHAGEAL ECHOCARDIOGRAM (TEE);  Surgeon: Josue Hector, MD;  Location: [facility] ENDOSCOPY;  Service: Cardiovascular;  Laterality: N/A;  . TONSILLECTOMY      reports that she quit smoking about 12 years ago. Her smoking use included cigarettes. She has a 35.00 pack-year smoking history. She has never used smokeless tobacco. She reports that she  does not drink alcohol or use drugs. family history includes Asthma in her brother and son; Esophageal cancer in her brother; Heart disease in her mother; Liver disease in her mother. Allergies  Allergen Reactions  . Duloxetine Other (See Comments)    REACTION: rhabdomyolysis  . Penicillins Other (See Comments)    SYNCOPE Has patient had a PCN reaction causing immediate rash, facial/tongue/throat swelling, SOB or lightheadedness with hypotension: Yes Has patient had a PCN reaction causing severe rash involving mucus membranes or skin necrosis: No Has patient had a PCN reaction that required hospitalization No Has patient had a PCN reaction occurring within the last 10 years: No If all of the above answers are "NO", then may proceed with Cephalosporin use.   . Latex Rash   Current Outpatient Medications on File Prior to Visit  Medication Sig Dispense Refill  . apixaban (ELIQUIS) 5 MG TABS tablet Take 1 tablet (5 mg total) by mouth 2 (two) times daily. 180 tablet 3  . atorvastatin (LIPITOR) 10 MG tablet TAKE 1 TABLET DAILY 90 tablet 2  . azelastine (ASTELIN) 0.1 % nasal spray Place 2 sprays at bedtime as needed into both nostrils for rhinitis. Use in each nostril as directed    . Calcium Carbonate-Vitamin D 600-400 MG-UNIT per tablet Take 1 tablet by mouth daily.     . famotidine (PEPCID) 20 MG tablet One at bedtime 90 tablet 3  . ferrous sulfate 325 (65 FE) MG tablet Take 325 mg by mouth 2 (two) times daily with a meal.     . FLUoxetine (PROZAC) 40 MG capsule TAKE 1 CAPSULE DAILY 90 capsule 3  . furosemide (LASIX) 80 MG tablet Take 1 tablet in the morning and 1 tablet in the afternoon 225 tablet 2  . glucosamine-chondroitin 500-400 MG tablet Take 1 tablet by mouth every morning.     Marland Kitchen KLOR-CON M20 20 MEQ tablet Take 1 tablet (20 mEq total) by mouth 2 (two) times daily. 180 tablet 2  . levalbuterol (XOPENEX) 1.25 MG/3ML nebulizer solution Take 1.25 mg by nebulization every 4 (four) hours.  (Patient taking differently: Take 1.25 mg by nebulization every 4 (four) hours as needed for wheezing or shortness of breath. ) 360 mL 11  . levocetirizine (XYZAL) 5 MG tablet Take 1 tablet (5 mg total) by mouth every evening. 30 tablet 5  . levothyroxine (SYNTHROID, LEVOTHROID) 25 MCG tablet Take 1 tablet (25 mcg total) by mouth daily before breakfast. 90 tablet 1  . metoprolol tartrate (LOPRESSOR) 25 MG tablet Take 1 tablet (25 mg total) by mouth 2 (two) times daily. 180 tablet 0  . montelukast (SINGULAIR) 10 MG tablet Take 1 tablet (10 mg total) by mouth daily. 90 tablet 3  . OXYGEN Inhale 2.5 L/min into the lungs continuous. And 4 with exertion    . pantoprazole (PROTONIX) 40 MG tablet Take 1 tablet (  40 mg total) by mouth daily. Take 30-60 min before first meal of the day 90 tablet 3  . PROAIR HFA 108 (90 Base) MCG/ACT inhaler USE 2 INHALATIONS EVERY 6 HOURS AS NEEDED FOR WHEEZING OR SHORTNESS OF BREATH 3 Inhaler 1  . propafenone (RYTHMOL SR) 325 MG 12 hr capsule TAKE 1 CAPSULE TWICE A DAY 180 capsule 0  . sodium chloride (OCEAN) 0.65 % SOLN nasal spray Place 2 sprays every 4 (four) hours as needed into both nostrils for congestion.    Marland Kitchen umeclidinium bromide (INCRUSE ELLIPTA) 62.5 MCG/INH AEPB Inhale 1 puff into the lungs daily. 1 each 6   No current facility-administered medications on file prior to visit.    Review of Systems  Constitutional: Negative for other unusual diaphoresis or sweats HENT: Negative for ear discharge or swelling Eyes: Negative for other worsening visual disturbances Respiratory: Negative for stridor or other swelling  Gastrointestinal: Negative for worsening distension or other blood Genitourinary: Negative for retention or other urinary change Musculoskeletal: Negative for other MSK pain or swelling Skin: Negative for color change or other new lesions Neurological: Negative for worsening tremors and other numbness  Psychiatric/Behavioral: Negative for worsening  agitation or other fatigue All other system neg per pt    Objective:   Physical Exam /BP 130/60 (BP Location: Left Arm, Patient Position: Sitting, Cuff Size: Large)   Pulse 60   Temp 97.9 F (36.6 C) (Oral)   Ht 5' 2"  (1.575 m)   Wt 250 lb (113.4 kg)   SpO2 95%   BMI 45.73 kg/m  VS noted, mild to mod ill appaering Constitutional: Pt appears in NAD HENT: Head: NCAT.  Right Ear: External ear normal.  Left Ear: External ear normal.  Eyes: . Pupils are equal, round, and reactive to light. Conjunctivae and EOM are normal Bilat tm's with mild erythema.  Max sinus areas non tender.  Pharynx with mild erythema, no exudate Nose: without d/c or deformity Neck: Neck supple. Gross normal ROM, No LA noted Cardiovascular: Normal rate and regular rhythm.   Pulmonary/Chest: Effort normal and breath sounds mod decreased bialt without rales but with mild bilat wheezing.  Neurological: Pt is alert. At baseline orientation, motor grossly intact Skin: Skin is warm. No rashes, other new lesions, no LE edema Psychiatric: Pt behavior is normal without agitation  No other exam findings Lab Results  Component Value Date   WBC 6.4 10/27/2017   HGB 11.8 (L) 10/27/2017   HCT 34.6 (L) 10/27/2017   PLT 213.0 10/27/2017   GLUCOSE 131 (H) 10/27/2017   CHOL 146 10/27/2017   TRIG 159.0 (H) 10/27/2017   HDL 35.00 (L) 10/27/2017   LDLDIRECT 140.2 04/10/2010   LDLCALC 79 10/27/2017   ALT 10 10/27/2017   AST 11 10/27/2017   NA 144 10/27/2017   K 4.0 10/27/2017   CL 103 10/27/2017   CREATININE 0.77 10/27/2017   BUN 15 10/27/2017   CO2 33 (H) 10/27/2017   TSH 4.59 (H) 11/08/2016   INR 1.15 10/07/2016   HGBA1C 4.6 10/27/2017   Rapid flu POCT -     Assessment & Plan:

## 2018-01-25 NOTE — Assessment & Plan Note (Signed)
stable overall by history and exam, recent data reviewed with pt, and pt to continue medical treatment as before,  to f/u any worsening symptoms or concerns  

## 2018-01-25 NOTE — Assessment & Plan Note (Signed)
C/w probable bronchitis vs pna, declines cxr, for antibx,  to f/u any worsening symptoms or concerns

## 2018-01-31 DIAGNOSIS — J449 Chronic obstructive pulmonary disease, unspecified: Secondary | ICD-10-CM | POA: Diagnosis not present

## 2018-02-02 ENCOUNTER — Other Ambulatory Visit: Payer: Self-pay | Admitting: *Deleted

## 2018-02-02 ENCOUNTER — Telehealth: Payer: Self-pay | Admitting: *Deleted

## 2018-02-02 ENCOUNTER — Telehealth: Payer: Self-pay | Admitting: Cardiology

## 2018-02-02 ENCOUNTER — Other Ambulatory Visit: Payer: Self-pay

## 2018-02-02 MED ORDER — APIXABAN 5 MG PO TABS: 5.0000 mg | ORAL_TABLET | Freq: Two times a day (BID) | ORAL | 1 refills | Status: DC

## 2018-02-02 MED ORDER — ATORVASTATIN CALCIUM 10 MG PO TABS: 10.0000 mg | ORAL_TABLET | Freq: Every day | ORAL | 1 refills | Status: DC

## 2018-02-02 MED ORDER — PROPAFENONE HCL ER 325 MG PO CP12: 325.0000 mg | ORAL_CAPSULE | Freq: Two times a day (BID) | ORAL | 1 refills | Status: DC

## 2018-02-02 MED ORDER — FUROSEMIDE 80 MG PO TABS: ORAL_TABLET | ORAL | 1 refills | Status: DC

## 2018-02-02 NOTE — Telephone Encounter (Signed)
No note needed 

## 2018-02-02 NOTE — Telephone Encounter (Signed)
refill 

## 2018-02-03 ENCOUNTER — Other Ambulatory Visit: Payer: Self-pay | Admitting: Internal Medicine

## 2018-02-03 ENCOUNTER — Other Ambulatory Visit: Payer: Self-pay | Admitting: *Deleted

## 2018-02-03 MED ORDER — FAMOTIDINE 20 MG PO TABS: ORAL_TABLET | ORAL | 3 refills | Status: DC

## 2018-02-03 MED ORDER — PANTOPRAZOLE SODIUM 40 MG PO TBEC: 40.0000 mg | DELAYED_RELEASE_TABLET | Freq: Every day | ORAL | 3 refills | Status: DC

## 2018-02-03 MED ORDER — APIXABAN 5 MG PO TABS: 5.0000 mg | ORAL_TABLET | Freq: Two times a day (BID) | ORAL | 1 refills | Status: DC

## 2018-02-03 MED ORDER — PROPAFENONE HCL ER 325 MG PO CP12: 325.0000 mg | ORAL_CAPSULE | Freq: Two times a day (BID) | ORAL | 1 refills | Status: DC

## 2018-02-03 NOTE — Addendum Note (Signed)
Addended by: Rockne Menghini on: 02/03/2018 08:33 AM   Modules accepted: Orders

## 2018-02-10 ENCOUNTER — Telehealth: Payer: Self-pay | Admitting: Internal Medicine

## 2018-02-10 MED ORDER — BUDESONIDE-FORMOTEROL FUMARATE 160-4.5 MCG/ACT IN AERO: 2.0000 | INHALATION_SPRAY | Freq: Two times a day (BID) | RESPIRATORY_TRACT | 0 refills | Status: DC

## 2018-02-10 NOTE — Telephone Encounter (Signed)
Call made to patient, she states she is in the process of transitioning to a pulmonologist in [place] and she would like enough to hold her over. I informed the patient symbicort was not on her med list. She states she was given a sample. Refill sent with 2 inhalers and no refills. Nothing further is needed at this time.

## 2018-02-14 ENCOUNTER — Telehealth: Payer: Self-pay | Admitting: Internal Medicine

## 2018-02-14 MED ORDER — HYDROCODONE-HOMATROPINE 5-1.5 MG/5ML PO SYRP: 5.0000 mL | ORAL_SOLUTION | Freq: Four times a day (QID) | ORAL | 0 refills | Status: AC | PRN

## 2018-02-14 MED ORDER — HYDROCODONE-HOMATROPINE 5-1.5 MG/5ML PO SYRP: 5.0000 mL | ORAL_SOLUTION | Freq: Four times a day (QID) | ORAL | 0 refills | Status: DC | PRN

## 2018-02-14 NOTE — Addendum Note (Signed)
Addended by: Biagio Borg on: 02/14/2018 01:06 PM   Modules accepted: Orders

## 2018-02-14 NOTE — Telephone Encounter (Signed)
Ok for refill - done erx °

## 2018-02-14 NOTE — Telephone Encounter (Signed)
Copied from [place] [id number]. Topic: Quick Communication - See Telephone Encounter >> Feb 14, 2018 10:51 AM Rutherford Nail, NT wrote: CRM for notification. See Telephone encounter for: 02/14/18. Patient calling and states that she was seen on 01/25/2018 by Dr Jenny Reichmann. States that he prescribed a cough syrup. States that her cough has come back and wanted to know if he could send in more cough syrup? Please advise. [place], [place]

## 2018-02-16 ENCOUNTER — Other Ambulatory Visit: Payer: Self-pay | Admitting: Cardiology

## 2018-02-16 MED ORDER — KLOR-CON M20 20 MEQ PO TBCR: 20.0000 meq | EXTENDED_RELEASE_TABLET | Freq: Two times a day (BID) | ORAL | 1 refills | Status: DC

## 2018-02-16 NOTE — Telephone Encounter (Signed)
New Message     *STAT* If patient is at the pharmacy, call can be transferred to refill team.   1. Which medications need to be refilled? (please list name of each medication and dose if known) [place]-con m20  2. Which pharmacy/location (including street and city if local pharmacy) is medication to be sent to? [facility]  3. Do they need a 30 day or 90 day supply? 90 day supply

## 2018-02-23 ENCOUNTER — Other Ambulatory Visit: Payer: Self-pay | Admitting: Internal Medicine

## 2018-02-23 MED ORDER — FLUOXETINE HCL 40 MG PO CAPS: 40.0000 mg | ORAL_CAPSULE | Freq: Every day | ORAL | 1 refills | Status: DC

## 2018-02-23 NOTE — Telephone Encounter (Signed)
Call placed to patient.. She is switching physicians. [place] office is more convient for her.

## 2018-02-23 NOTE — Telephone Encounter (Signed)
Copied from [place] [id number]. Topic: Quick Communication - Rx Refill/Question >> Feb 23, 2018 12:02 PM Virl Axe D wrote: Medication: FLUoxetine (PROZAC) 40 MG capsule   Has the patient contacted their pharmacy? Yes.   (Agent: If no, request that the patient contact the pharmacy for the refill.) (Agent: If yes, when and what did the pharmacy advise?)  Preferred Pharmacy (with phone number or street name): [place], [place] [phone] (Phone) [phone] (Fax)  Agent: Please be advised that RX refills may take up to 3 business days. We ask that you follow-up with your pharmacy.

## 2018-02-23 NOTE — Telephone Encounter (Signed)
Requested medication (s) are due for refill today: yes  Requested medication (s) are on the active medication list: yes  Last refill:  07/04/17  #90 3 refills  Future visit scheduled: yes with Mclean-Scocuzza.   Notes to clinic:  Pt states she is switching to [facility] office     Requested Prescriptions  Pending Prescriptions Disp Refills   FLUoxetine (PROZAC) 40 MG capsule 90 capsule 3    Sig: Take 1 capsule (40 mg total) by mouth daily.     Psychiatry:  Antidepressants - SSRI Passed - 02/23/2018 12:37 PM      Passed - Completed PHQ-2 or PHQ-9 in the last 360 days.      Passed - Valid encounter within last 6 months    Recent Outpatient Visits          4 weeks ago Flu-like symptoms   [place] John, James W, MD   3 months ago [facility] [place] John, James W, MD   8 months ago Allergic rhinitis, unspecified seasonality, unspecified trigger   [facility] John, James W, MD   9 months ago Encounter for well adult exam with abnormal findings   [place] John, James W, MD   1 year ago COPD [facility] -Georges Mouse, MD      Future Appointments            In 1 week McLean-Scocuzza, Nino Glow, MD [facility] [place]

## 2018-02-27 ENCOUNTER — Telehealth: Payer: Self-pay | Admitting: Internal Medicine

## 2018-02-27 ENCOUNTER — Ambulatory Visit: Payer: Self-pay

## 2018-02-27 MED ORDER — HYDROCODONE-HOMATROPINE 5-1.5 MG/5ML PO SYRP: 5.0000 mL | ORAL_SOLUTION | Freq: Four times a day (QID) | ORAL | 0 refills | Status: AC | PRN

## 2018-02-27 NOTE — Telephone Encounter (Signed)
Done erx 

## 2018-02-27 NOTE — Telephone Encounter (Signed)
See Triage note of 02/27/18

## 2018-02-27 NOTE — Telephone Encounter (Signed)
Copied from [place] [id number]. Topic: Quick Communication - See Telephone Encounter >> Feb 27, 2018 11:28 AM Ivar Drape wrote: CRM for notification. See Telephone encounter for: 02/27/18. Patient would like to know if she can be called in HOMATROPINE again because her cough is back.  Please advise, and have it sent to her preferred pharmacy [facility].

## 2018-02-27 NOTE — Telephone Encounter (Signed)
Returned call to pt. To discuss cough.  Reported she has had a cough off and on, since November.  Reported her cough is intermittent and "coughing up thin, clear fluid."  Stated "I stay short of breath."  Denied worsening of shortness of breath from her baseline.  Reported on 02 at 4 liters/ [place].  Reported 02 sats. range 95-97 % on 02.  Requested refill on Hycodan cough syrup.  Last seen in office 01/25/18.  Denied chest pain.  Denied any swelling of extremities.  Denied fever/ chills.  Reported clear nasal drainage; stated "I have allergies."  Advised she will need to be re-evaluated in office, before cough syrup can be prescribed.  Stated she has an appt. With new Pulmonologist next Tues., 2/25, and prefers not to schedule appt. with Dr. Jenny Reichmann at present time.  Advised will send note to Dr. Jenny Reichmann re: pt's symptoms.  Care advice given per protocol.  Verb. Understanding; agreed.            Reason for Disposition . [1] Continuous (nonstop) coughing interferes with work or school AND [2] no improvement using cough treatment per protocol  Answer Assessment - Initial Assessment Questions 1. ONSET: "When did the cough begin?"      "I've had this cough since November, off and on" 2. SEVERITY: "How bad is the cough today?"      Coughing up thin, clear liquid  3. RESPIRATORY DISTRESS: "Describe your breathing."      On 02 @ 4 liter [place]; stated she stays short of breath   4. FEVER: "Do you have a fever?" If so, ask: "What is your temperature, how was it measured, and when did it start?"     Denied  5. SPUTUM: "Describe the color of your sputum" (e.g., clear, white, yellow, green), "Has there been any change recently?"     Clear thin liquid 6. HEMOPTYSIS: "Are you coughing up any blood?" If so ask: "How much, flecks, streaks, tablespoons, etc.?"     Denied any blood in phlegm 7. CARDIAC HISTORY: "Do you have any history of heart disease?" (e.g., heart attack, congestive heart failure)     CHF 8. LUNG HISTORY:  "Do you have any history of lung disease?"  (e.g., pulmonary embolus, asthma, emphysema/COPD)     Hx of COPD 9. OTHER SYMPTOMS: "Do you have any other symptoms? (e.g., runny nose, wheezing, chest pain)     Denied swelling of LE's;  Denied wheezing , denied chest pain, runny nose-clear   10. PREGNANCY: "Is there any chance you are pregnant?" "When was your last menstrual period?"       N/a  11. TRAVEL: "Have you traveled out of the country in the last month?" (e.g., travel history, exposures)       Denied  Protocols used: COUGH - CHRONIC-A-AH  Copied from [place] [phone]. Topic: Quick Communication - See Telephone Encounter >> Feb 27, 2018 11:28 AM Ivar Drape wrote: CRM for notification. See Telephone encounter for: 02/27/18. Patient would like to know if she can be called in HOMATROPINE again because her cough is back.  Please advise, and have it sent to her preferred pharmacy [facility].

## 2018-02-27 NOTE — Addendum Note (Signed)
Addended by: Biagio Borg on: 02/27/2018 04:57 PM   Modules accepted: Orders

## 2018-03-03 DIAGNOSIS — J449 Chronic obstructive pulmonary disease, unspecified: Secondary | ICD-10-CM | POA: Diagnosis not present

## 2018-03-07 ENCOUNTER — Ambulatory Visit: Payer: BLUE CROSS/BLUE SHIELD | Admitting: Internal Medicine

## 2018-03-07 ENCOUNTER — Encounter: Payer: Self-pay | Admitting: Internal Medicine

## 2018-03-07 VITALS — BP 132/68 | HR 54 | Temp 98.6°F | Ht 62.0 in | Wt 251.0 lb

## 2018-03-07 DIAGNOSIS — I350 Nonrheumatic aortic (valve) stenosis: Secondary | ICD-10-CM | POA: Insufficient documentation

## 2018-03-07 DIAGNOSIS — E611 Iron deficiency: Secondary | ICD-10-CM

## 2018-03-07 DIAGNOSIS — I4891 Unspecified atrial fibrillation: Secondary | ICD-10-CM

## 2018-03-07 DIAGNOSIS — R053 Chronic cough: Secondary | ICD-10-CM

## 2018-03-07 DIAGNOSIS — N281 Cyst of kidney, acquired: Secondary | ICD-10-CM

## 2018-03-07 DIAGNOSIS — I10 Essential (primary) hypertension: Secondary | ICD-10-CM | POA: Diagnosis not present

## 2018-03-07 DIAGNOSIS — K760 Fatty (change of) liver, not elsewhere classified: Secondary | ICD-10-CM

## 2018-03-07 DIAGNOSIS — E039 Hypothyroidism, unspecified: Secondary | ICD-10-CM | POA: Insufficient documentation

## 2018-03-07 DIAGNOSIS — I480 Paroxysmal atrial fibrillation: Secondary | ICD-10-CM

## 2018-03-07 DIAGNOSIS — R05 Cough: Secondary | ICD-10-CM

## 2018-03-07 DIAGNOSIS — I5032 Chronic diastolic (congestive) heart failure: Secondary | ICD-10-CM

## 2018-03-07 DIAGNOSIS — E785 Hyperlipidemia, unspecified: Secondary | ICD-10-CM

## 2018-03-07 DIAGNOSIS — F32A Depression, unspecified: Secondary | ICD-10-CM

## 2018-03-07 DIAGNOSIS — E538 Deficiency of other specified B group vitamins: Secondary | ICD-10-CM

## 2018-03-07 DIAGNOSIS — I483 Typical atrial flutter: Secondary | ICD-10-CM

## 2018-03-07 DIAGNOSIS — J9611 Chronic respiratory failure with hypoxia: Secondary | ICD-10-CM

## 2018-03-07 DIAGNOSIS — J9612 Chronic respiratory failure with hypercapnia: Secondary | ICD-10-CM

## 2018-03-07 DIAGNOSIS — M5136 Other intervertebral disc degeneration, lumbar region: Secondary | ICD-10-CM

## 2018-03-07 DIAGNOSIS — H269 Unspecified cataract: Secondary | ICD-10-CM

## 2018-03-07 DIAGNOSIS — Z1231 Encounter for screening mammogram for malignant neoplasm of breast: Secondary | ICD-10-CM

## 2018-03-07 DIAGNOSIS — J449 Chronic obstructive pulmonary disease, unspecified: Secondary | ICD-10-CM

## 2018-03-07 DIAGNOSIS — F329 Major depressive disorder, single episode, unspecified: Secondary | ICD-10-CM

## 2018-03-07 DIAGNOSIS — J439 Emphysema, unspecified: Secondary | ICD-10-CM

## 2018-03-07 DIAGNOSIS — E559 Vitamin D deficiency, unspecified: Secondary | ICD-10-CM

## 2018-03-07 NOTE — Patient Instructions (Signed)

## 2018-03-07 NOTE — Progress Notes (Signed)
Pre visit review using our clinic review tool, if applicable. No additional management support is needed unless otherwise documented below in the visit note. 

## 2018-03-09 ENCOUNTER — Telehealth: Payer: Self-pay | Admitting: Radiology

## 2018-03-09 ENCOUNTER — Other Ambulatory Visit (INDEPENDENT_AMBULATORY_CARE_PROVIDER_SITE_OTHER): Payer: Medicare Other

## 2018-03-09 DIAGNOSIS — E611 Iron deficiency: Secondary | ICD-10-CM | POA: Diagnosis not present

## 2018-03-09 DIAGNOSIS — E039 Hypothyroidism, unspecified: Secondary | ICD-10-CM | POA: Diagnosis not present

## 2018-03-09 DIAGNOSIS — E559 Vitamin D deficiency, unspecified: Secondary | ICD-10-CM

## 2018-03-09 DIAGNOSIS — E538 Deficiency of other specified B group vitamins: Secondary | ICD-10-CM

## 2018-03-09 DIAGNOSIS — I10 Essential (primary) hypertension: Secondary | ICD-10-CM | POA: Diagnosis not present

## 2018-03-09 LAB — LIPID PANEL
Cholesterol: 146 mg/dL (ref 0–200)
HDL: 40.3 mg/dL (ref >39.00)
LDL Cholesterol: 83 mg/dL (ref 0–99)
NonHDL: 105.53
Total CHOL/HDL Ratio: 4
Triglycerides: 111 mg/dL (ref 0.0–149.0)
VLDL: 22.2 mg/dL (ref 0.0–40.0)

## 2018-03-09 LAB — COMPREHENSIVE METABOLIC PANEL
ALT: 9 U/L (ref 0–35)
AST: 10 U/L (ref 0–37)
Albumin: 4.2 g/dL (ref 3.5–5.2)
Alkaline Phosphatase: 69 U/L (ref 39–117)
BUN: 14 mg/dL (ref 6–23)
CO2: 35 mEq/L — ABNORMAL HIGH (ref 19–32)
Calcium: 8.7 mg/dL (ref 8.4–10.5)
Chloride: 102 mEq/L (ref 96–112)
Creatinine, Ser: 0.8 mg/dL (ref 0.40–1.20)
GFR: 71.38 mL/min (ref >60.00)
Glucose, Bld: 131 mg/dL — ABNORMAL HIGH (ref 70–99)
Potassium: 4 mEq/L (ref 3.5–5.1)
Sodium: 144 mEq/L (ref 135–145)
Total Bilirubin: 1.3 mg/dL — ABNORMAL HIGH (ref 0.2–1.2)
Total Protein: 6.6 g/dL (ref 6.0–8.3)

## 2018-03-09 LAB — CBC WITH DIFFERENTIAL/PLATELET
Basophils Absolute: 0 10*3/uL (ref 0.0–0.1)
Basophils Relative: 0.3 % (ref 0.0–3.0)
Eosinophils Absolute: 0.1 10*3/uL (ref 0.0–0.7)
Eosinophils Relative: 1.5 % (ref 0.0–5.0)
HCT: 32 % — ABNORMAL LOW (ref 36.0–46.0)
Hemoglobin: 11.1 g/dL — ABNORMAL LOW (ref 12.0–15.0)
Lymphocytes Relative: 29.3 % (ref 12.0–46.0)
Lymphs Abs: 1.8 10*3/uL (ref 0.7–4.0)
MCHC: 34.6 g/dL (ref 30.0–36.0)
MCV: 87.4 fl (ref 78.0–100.0)
Monocytes Absolute: 0.4 10*3/uL (ref 0.1–1.0)
Monocytes Relative: 6.7 % (ref 3.0–12.0)
Neutro Abs: 3.8 10*3/uL (ref 1.4–7.7)
Neutrophils Relative %: 62.2 % (ref 43.0–77.0)
Platelets: 238 10*3/uL (ref 150.0–400.0)
RBC: 3.66 Mil/uL — ABNORMAL LOW (ref 3.87–5.11)
RDW: 17.2 % — ABNORMAL HIGH (ref 11.5–15.5)
WBC: 6.1 10*3/uL (ref 4.0–10.5)

## 2018-03-09 LAB — VITAMIN B12: Vitamin B-12: 212 pg/mL (ref 211–911)

## 2018-03-09 LAB — TSH: TSH: 4.55 u[IU]/mL — ABNORMAL HIGH (ref 0.35–4.50)

## 2018-03-09 LAB — VITAMIN D 25 HYDROXY (VIT D DEFICIENCY, FRACTURES): VITD: 35.54 ng/mL (ref 30.00–100.00)

## 2018-03-09 NOTE — Telephone Encounter (Signed)
Pt came in for labs today with no orders. While orders were being placed by provider, asked pt if she was on any blood thinners incase provider would want to draw for that. Drew tube to have INR sent incase provider would like. Would you like to send out for this as well? Pt stated it has not been checked in a long time.

## 2018-03-09 NOTE — Telephone Encounter (Signed)
shes on eliquis no inr needed for that  Lab orders placed otherwise

## 2018-03-10 LAB — IRON,TIBC AND FERRITIN PANEL
%SAT: 23 % (calc) (ref 16–45)
Ferritin: 77 ng/mL (ref 16–288)
Iron: 73 ug/dL (ref 45–160)
TIBC: 318 mcg/dL (calc) (ref 250–450)

## 2018-03-12 ENCOUNTER — Other Ambulatory Visit: Payer: Self-pay | Admitting: Internal Medicine

## 2018-03-12 ENCOUNTER — Encounter: Payer: Self-pay | Admitting: Internal Medicine

## 2018-03-12 DIAGNOSIS — D509 Iron deficiency anemia, unspecified: Secondary | ICD-10-CM

## 2018-03-12 DIAGNOSIS — K219 Gastro-esophageal reflux disease without esophagitis: Secondary | ICD-10-CM

## 2018-03-12 DIAGNOSIS — J449 Chronic obstructive pulmonary disease, unspecified: Secondary | ICD-10-CM

## 2018-03-12 DIAGNOSIS — I4892 Unspecified atrial flutter: Secondary | ICD-10-CM

## 2018-03-12 DIAGNOSIS — I4891 Unspecified atrial fibrillation: Secondary | ICD-10-CM

## 2018-03-12 DIAGNOSIS — E039 Hypothyroidism, unspecified: Secondary | ICD-10-CM

## 2018-03-12 DIAGNOSIS — I10 Essential (primary) hypertension: Secondary | ICD-10-CM

## 2018-03-12 DIAGNOSIS — J309 Allergic rhinitis, unspecified: Secondary | ICD-10-CM

## 2018-03-12 MED ORDER — AZELASTINE HCL 0.1 % NA SOLN: 2.0000 | Freq: Every evening | NASAL | 3 refills | Status: AC | PRN

## 2018-03-12 MED ORDER — METOPROLOL TARTRATE 25 MG PO TABS: 25.0000 mg | ORAL_TABLET | Freq: Two times a day (BID) | ORAL | 3 refills | Status: DC

## 2018-03-12 MED ORDER — FERROUS SULFATE 325 (65 FE) MG PO TABS: 325.0000 mg | ORAL_TABLET | Freq: Two times a day (BID) | ORAL | 3 refills | Status: DC

## 2018-03-12 MED ORDER — FAMOTIDINE 20 MG PO TABS: ORAL_TABLET | ORAL | 3 refills | Status: DC

## 2018-03-12 MED ORDER — ALBUTEROL SULFATE HFA 108 (90 BASE) MCG/ACT IN AERS: INHALATION_SPRAY | RESPIRATORY_TRACT | 4 refills | Status: DC

## 2018-03-12 MED ORDER — LEVOCETIRIZINE DIHYDROCHLORIDE 5 MG PO TABS: 5.0000 mg | ORAL_TABLET | Freq: Every evening | ORAL | 3 refills | Status: DC

## 2018-03-12 MED ORDER — LEVOTHYROXINE SODIUM 50 MCG PO TABS: 50.0000 ug | ORAL_TABLET | Freq: Every day | ORAL | 3 refills | Status: DC

## 2018-03-12 MED ORDER — BUDESONIDE-FORMOTEROL FUMARATE 160-4.5 MCG/ACT IN AERO: 2.0000 | INHALATION_SPRAY | Freq: Two times a day (BID) | RESPIRATORY_TRACT | 0 refills | Status: DC

## 2018-03-12 NOTE — Progress Notes (Signed)
No chief complaint on file.  TOC from [place]. She needs refill on all primary care meds [facility]  1. COPD on 3L portable O2 today O2 sat 93% c/o sob, cough worse recently x 4 months Hycodan helps. Symbicort 1 puff bid makes her shaky. She thinks she tried Advair in the past. Incruse is too expensive  She was smoker x 30 years max 1.5 ppd quit in 2008/2009  2. H/o PE/DVT in the past  3. Hypothyroidism on levo 25 mcg qd  4. H/o AS (moderate), chronic HFpEF, HTN, pAFib with RVR now rate controlled/flutter, HLD, atherosclerosis  -f/u Cards Dr. Stanford Breed last seen 11/01/17  5. HTN/HLD on lasix 80 mg bid and metoprolol 25 mg bid and for #4. BP controlled today  6. Depression stable on prozac 40 mg qd   Review of Systems  Constitutional: Negative for weight loss.  HENT: Negative for hearing loss.   Eyes: Negative for blurred vision.  Respiratory: Positive for cough and shortness of breath.   Cardiovascular: Negative for chest pain.  Gastrointestinal: Negative for abdominal pain.  Musculoskeletal: Negative for falls.  Skin: Negative for rash.  Neurological: Negative for headaches.  Psychiatric/Behavioral: Negative for depression.   Past Medical History:  Diagnosis Date  . Anemia    hx  . Anxiety   . Arthritis   . Asthma   . Atrial fibrillation and flutter ([place])   . Atypical lobular hyperplasia of right breast 05/09/2015  . Breast CA ([place])    ?  Marland Kitchen Chronic rhinitis   . COPD (chronic obstructive pulmonary disease) ([place])    [place]. PFTs 07/25/06 FEV1 55% ratio 53, DLC0 51% HFA 50% 12/16/2009  . Depression   . FRACTURE, RIB, RIGHT 06/18/2009  . Glucose intolerance (impaired glucose tolerance)    on steroids  . History of DVT (deep vein thrombosis)   . Hx of cardiac catheterization    LHC (01/2001):  Normal Cors.  EF 65%.;  LexiScan Myoview (03/2013): No ischemia, EF 61%, normal  . Hx of echocardiogram    Echo (11/2011):  Mod LVH, EF 60-65%, no RWMA, MAC, mild LAE, PASP 34.  Marland Kitchen HYPERLIPIDEMIA    . Hypertension   . Hypothyroidism   . Morbid obesity ([place])    target wt=179lb for BMI<30 Peak wt 282lb  . Osteoporosis    last BMD 4/08 -2.4, intolerant of bisphosphonates  . Peripheral vascular disease ([facility])    hx dvt.pe  . Pneumonia    hx  . PONV (postoperative nausea and vomiting)   . PREMATURE VENTRICULAR CONTRACTIONS   . PULMONARY EMBOLISM, HX OF 06/19/2007  . Recurrent upper respiratory infection (URI)   . Rhabdomyolysis 07/29/2009  . Tremor   . VITAMIN D DEFICIENCY    Past Surgical History:  Procedure Laterality Date  . A-FLUTTER ABLATION N/A 10/08/2016   Procedure: A-FLUTTER ABLATION;  Surgeon: Evans Lance, MD;  Location: [facility];  Service: Cardiovascular;  Laterality: N/A;  . ABDOMINAL HYSTERECTOMY    . BREAST LUMPECTOMY WITH RADIOACTIVE SEED LOCALIZATION Right 06/05/2015   Procedure: RIGHT BREAST LUMPECTOMY WITH RADIOACTIVE SEED LOCALIZATION;  Surgeon: Fanny Skates, MD;  Location: [facility];  Service: General;  Laterality: Right;  . BREAST SURGERY Left    s/p mass removal  . CARDIOVERSION N/A 12/11/2014   Procedure: CARDIOVERSION;  Surgeon: Josue Hector, MD;  Location: [facility];  Service: Cardiovascular;  Laterality: N/A;  . CARDIOVERSION N/A 09/15/2016   Procedure: CARDIOVERSION;  Surgeon: Jerline Pain, MD;  Location: [facility]  ENDOSCOPY;  Service: Cardiovascular;  Laterality: N/A;  . COLONOSCOPY  10/22/2003, ?2006  . COLONOSCOPY WITH PROPOFOL N/A 01/15/2016   Procedure: COLONOSCOPY WITH PROPOFOL;  Surgeon: Gatha Mayer, MD;  Location: [facility]COPY;  Service: Endoscopy;  Laterality: N/A;  . ESOPHAGOGASTRODUODENOSCOPY (EGD) WITH PROPOFOL N/A 01/15/2016   Procedure: ESOPHAGOGASTRODUODENOSCOPY (EGD) WITH PROPOFOL;  Surgeon: Gatha Mayer, MD;  Location: [facility];  Service: Endoscopy;  Laterality: N/A;  . s/p left arm fracture with fall off chair    . skin graft to middle R finger  1975  . TEE WITHOUT CARDIOVERSION N/A 12/11/2014   Procedure: TRANSESOPHAGEAL  ECHOCARDIOGRAM (TEE);  Surgeon: Josue Hector, MD;  Location: [facility] ENDOSCOPY;  Service: Cardiovascular;  Laterality: N/A;  . TONSILLECTOMY     Family History  Problem Relation Age of Onset  . Heart disease Mother   . Liver disease Mother        alcohol related  . Asthma Brother   . Esophageal cancer Brother   . Asthma Son   . Colon cancer Neg Hx   . Stomach cancer Neg Hx   . Pancreatic cancer Neg Hx   . Inflammatory bowel disease Neg Hx   . Allergic rhinitis Neg Hx   . Eczema Neg Hx   . Urticaria Neg Hx    Social History   Socioeconomic History  . Marital status: Married    Spouse name: Not on file  . Number of children: 3  . Years of education: Not on file  . Highest education level: Not on file  Occupational History  . 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 Financial resource strain: Not on file  . Food insecurity:    Worry: Not on file    Inability: Not on file  . Transportation needs:    Medical: Not on file    Non-medical: Not on file  Tobacco Use  . Smoking status: Former Smoker    Packs/day: 1.00    Years: 35.00    Pack years: 35.00    Types: Cigarettes    Last attempt to quit: 01/11/2006    Years since quitting: 12.1  . Smokeless tobacco: Never Used  Substance and Sexual Activity  . Alcohol use: No    Alcohol/week: 0.0 standard drinks  . Drug use: No  . Sexual activity: Yes  Lifestyle  . Physical activity:    Days per week: Not on file    Minutes per session: Not on file  . Stress: Not on file  Relationships  . Social connections:    Talks on phone: Not on file    Gets together: Not on file    Attends religious service: Not on file    Active member of club or organization: Not on file    Attends meetings of clubs or organizations: Not on file    Relationship status: Not on file  . Intimate partner violence:    Fear of current or ex partner: Not on file    Emotionally abused: Not on file     Physically abused: Not on file    Forced sexual activity: Not on file  Other Topics Concern  . Not on file  Social History Narrative   Married 1 son 2 daughters   Disabled   2 caffeine/day   Past smoker   11/12/2015   Current Meds  Medication Sig  . apixaban (ELIQUIS) 5 MG TABS tablet Take 1 tablet (5 mg total)  by mouth 2 (two) times daily.  Marland Kitchen atorvastatin (LIPITOR) 10 MG tablet Take 1 tablet (10 mg total) by mouth daily.  . Calcium Carbonate-Vitamin D 600-400 MG-UNIT per tablet Take 1 tablet by mouth daily.   Marland Kitchen FLUoxetine (PROZAC) 40 MG capsule Take 1 capsule (40 mg total) by mouth daily.  . furosemide (LASIX) 80 MG tablet Take 1 tablet in the morning and 1 tablet in the afternoon  . glucosamine-chondroitin 500-400 MG tablet Take 1 tablet by mouth every morning.   . [EXPIRED] HYDROcodone-homatropine (HYCODAN) 5-1.5 MG/5ML syrup Take 5 mLs by mouth every 6 (six) hours as needed for up to 10 days for cough.  Marland Kitchen KLOR-CON M20 20 MEQ tablet Take 1 tablet (20 mEq total) by mouth 2 (two) times daily.  Marland Kitchen levalbuterol (XOPENEX) 1.25 MG/3ML nebulizer solution Take 1.25 mg by nebulization every 4 (four) hours. (Patient taking differently: Take 1.25 mg by nebulization every 4 (four) hours as needed for wheezing or shortness of breath. )  . montelukast (SINGULAIR) 10 MG tablet Take 1 tablet (10 mg total) by mouth daily.  . OXYGEN Inhale 2.5 L/min into the lungs continuous. And 4 with exertion  . pantoprazole (PROTONIX) 40 MG tablet Take 1 tablet (40 mg total) by mouth daily. Take 30-60 min before first meal of the day  . propafenone (RYTHMOL SR) 325 MG 12 hr capsule Take 1 capsule (325 mg total) by mouth 2 (two) times daily.  . sodium chloride (OCEAN) 0.65 % SOLN nasal spray Place 2 sprays every 4 (four) hours as needed into both nostrils for congestion.  Marland Kitchen umeclidinium bromide (INCRUSE ELLIPTA) 62.5 MCG/INH AEPB Inhale 1 puff into the lungs daily.  . [DISCONTINUED] azelastine (ASTELIN) 0.1 % nasal  spray Place 2 sprays at bedtime as needed into both nostrils for rhinitis. Use in each nostril as directed  . [DISCONTINUED] budesonide-formoterol (SYMBICORT) 160-4.5 MCG/ACT inhaler Inhale 2 puffs into the lungs every 12 (twelve) hours.  . [DISCONTINUED] famotidine (PEPCID) 20 MG tablet One at bedtime  . [DISCONTINUED] ferrous sulfate 325 (65 FE) MG tablet Take 325 mg by mouth 2 (two) times daily with a meal.   . [DISCONTINUED] levocetirizine (XYZAL) 5 MG tablet Take 1 tablet (5 mg total) by mouth every evening.  . [DISCONTINUED] levothyroxine (SYNTHROID, LEVOTHROID) 25 MCG tablet Take 1 tablet (25 mcg total) by mouth daily before breakfast.  . [DISCONTINUED] metoprolol tartrate (LOPRESSOR) 25 MG tablet Take 1 tablet (25 mg total) by mouth 2 (two) times daily.  . [DISCONTINUED] PROAIR HFA 108 (90 Base) MCG/ACT inhaler USE 2 INHALATIONS EVERY 6 HOURS AS NEEDED FOR WHEEZING OR SHORTNESS OF BREATH   Allergies  Allergen Reactions  . Duloxetine Other (See Comments)    REACTION: rhabdomyolysis  . Penicillins Other (See Comments)    SYNCOPE Has patient had a PCN reaction causing immediate rash, facial/tongue/throat swelling, SOB or lightheadedness with hypotension: Yes Has patient had a PCN reaction causing severe rash involving mucus membranes or skin necrosis: No Has patient had a PCN reaction that required hospitalization No Has patient had a PCN reaction occurring within the last 10 years: No If all of the above answers are "NO", then may proceed with Cephalosporin use.   . Latex Rash   Recent Results (from the past 2160 hour(s))  POC Influenza A&B (Binax test)     Status: Normal   Collection Time: 01/25/18  3:51 PM  Result Value Ref Range   Influenza A, POC Negative Negative   Influenza B, POC Negative Negative  Comprehensive metabolic panel     Status: Abnormal   Collection Time: 03/09/18  9:49 AM  Result Value Ref Range   Sodium 144 135 - 145 mEq/L   Potassium 4.0 3.5 - 5.1 mEq/L    Chloride 102 96 - 112 mEq/L   CO2 35 (H) 19 - 32 mEq/L   Glucose, Bld 131 (H) 70 - 99 mg/dL   BUN 14 6 - 23 mg/dL   Creatinine, Ser 0.80 0.40 - 1.20 mg/dL   Total Bilirubin 1.3 (H) 0.2 - 1.2 mg/dL   Alkaline Phosphatase 69 39 - 117 U/L   AST 10 0 - 37 U/L   ALT 9 0 - 35 U/L   Total Protein 6.6 6.0 - 8.3 g/dL   Albumin 4.2 3.5 - 5.2 g/dL   Calcium 8.7 8.4 - 10.5 mg/dL   GFR 71.38 >60.00 mL/min  CBC with Differential/Platelet     Status: Abnormal   Collection Time: 03/09/18  9:49 AM  Result Value Ref Range   WBC 6.1 4.0 - 10.5 K/uL   RBC 3.66 (L) 3.87 - 5.11 Mil/uL   Hemoglobin 11.1 (L) 12.0 - 15.0 g/dL   HCT 32.0 (L) 36.0 - 46.0 %   MCV 87.4 78.0 - 100.0 fl   MCHC 34.6 30.0 - 36.0 g/dL   RDW 17.2 (H) 11.5 - 15.5 %   Platelets 238.0 150.0 - 400.0 K/uL   Neutrophils Relative % 62.2 43.0 - 77.0 %   Lymphocytes Relative 29.3 12.0 - 46.0 %   Monocytes Relative 6.7 3.0 - 12.0 %   Eosinophils Relative 1.5 0.0 - 5.0 %   Basophils Relative 0.3 0.0 - 3.0 %   Neutro Abs 3.8 1.4 - 7.7 K/uL   Lymphs Abs 1.8 0.7 - 4.0 K/uL   Monocytes Absolute 0.4 0.1 - 1.0 K/uL   Eosinophils Absolute 0.1 0.0 - 0.7 K/uL   Basophils Absolute 0.0 0.0 - 0.1 K/uL  TSH     Status: Abnormal   Collection Time: 03/09/18  9:49 AM  Result Value Ref Range   TSH 4.55 (H) 0.35 - 4.50 uIU/mL  Iron, TIBC and Ferritin Panel     Status: None   Collection Time: 03/09/18  9:49 AM  Result Value Ref Range   Iron 73 45 - 160 mcg/dL   TIBC 318 250 - 450 mcg/dL (calc)   %SAT 23 16 - 45 % (calc)   Ferritin 77 16 - 288 ng/mL  Lipid panel     Status: None   Collection Time: 03/09/18  9:49 AM  Result Value Ref Range   Cholesterol 146 0 - 200 mg/dL    Comment: ATP III Classification       Desirable:  < 200 mg/dL               Borderline High:  200 - 239 mg/dL          High:  > = 240 mg/dL   Triglycerides 111.0 0.0 - 149.0 mg/dL    Comment: Normal:  <150 mg/dLBorderline High:  150 - 199 mg/dL   HDL 40.30 >39.00 mg/dL   VLDL  22.2 0.0 - 40.0 mg/dL   LDL Cholesterol 83 0 - 99 mg/dL   Total CHOL/HDL Ratio 4     Comment:                Men          Women1/2 Average Risk     3.4  3.3Average Risk          5.0          4.42X Average Risk          9.6          7.13X Average Risk          15.0          11.0                       NonHDL 105.53     Comment: NOTE:  Non-HDL goal should be 30 mg/dL higher than patient's LDL goal (i.e. LDL goal of < 70 mg/dL, would have non-HDL goal of < 100 mg/dL)  Vitamin D (25 hydroxy)     Status: None   Collection Time: 03/09/18  9:49 AM  Result Value Ref Range   VITD 35.54 30.00 - 100.00 ng/mL  Vitamin B12     Status: None   Collection Time: 03/09/18  9:49 AM  Result Value Ref Range   Vitamin B-12 212 211 - 911 pg/mL   Objective  Body mass index is 45.91 kg/m. Wt Readings from Last 3 Encounters:  03/07/18 251 lb (113.9 kg)  01/25/18 250 lb (113.4 kg)  12/05/17 255 lb (115.7 kg)   Temp Readings from Last 3 Encounters:  03/07/18 98.6 F (37 C) (Oral)  01/25/18 97.9 F (36.6 C) (Oral)  10/27/17 97.8 F (36.6 C) (Oral)   BP Readings from Last 3 Encounters:  03/07/18 132/68  01/25/18 130/60  12/05/17 116/74   Pulse Readings from Last 3 Encounters:  03/07/18 (!) 54  01/25/18 60  12/05/17 78    Physical Exam Vitals signs and nursing note reviewed.  Constitutional:      Appearance: Normal appearance. She is well-developed and well-groomed. She is morbidly obese.  HENT:     Head: Normocephalic and atraumatic.     Nose: Nose normal.     Mouth/Throat:     Mouth: Mucous membranes are moist.     Pharynx: Oropharynx is clear.  Eyes:     Conjunctiva/sclera: Conjunctivae normal.     Pupils: Pupils are equal, round, and reactive to light.  Cardiovascular:     Rate and Rhythm: Normal rate and regular rhythm.     Heart sounds: Murmur present.     Comments: Rate controlled  Mod AS with + murmur   Pulmonary:     Effort: Pulmonary effort is normal.     Breath  sounds: Normal breath sounds.     Comments: On 3L today   Skin:    General: Skin is warm and dry.  Neurological:     General: No focal deficit present.     Mental Status: She is alert and oriented to person, place, and time. Mental status is at baseline.     Gait: Gait normal.  Psychiatric:        Attention and Perception: Attention and perception normal.        Mood and Affect: Mood and affect normal.        Speech: Speech normal.        Behavior: Behavior normal. Behavior is cooperative.        Thought Content: Thought content normal.        Cognition and Memory: Cognition and memory normal.        Judgment: Judgment normal.     Assessment   1. COPD with chronic resp. Failure uncontrolled with hypoxia/hypercapnea with worsening cough x  4 months, sob  -on 3L o2 today O2 sat 93%  2. H/o AS (moderate), chronic HFpEF, HTN, pAFib with RVR now rate controlled/flutter, HLD, atherosclerosis  3. HTN/HLD  4. Hypothyroidism on levothyroxine 25 mcg qd  5. Depression stable on prozac 40 mg qd  6. HM  Plan  1.  Stat CT chest to w/u in the past CXR with cardiomegaly and mild pulm edema  Per pt  Hycodan helps   Will refer stat to [facility] to help manage COPD   Given Trelegy sample x 14 day supply x 1 today  Prn Albuterol symbicort 160/4.5 doing 1 puff bid due to 2 puffs making her feel shaky  She has tried Advair in the past  She stopped using incruse 2/2 cost  Cont O2  2. F/u cards Dr. Stanford Breed cont meds per cards on Eliquis 5 mg bid  Cards appt 05/16/18 3. See #2  Controlled on lasix 80 mg bid, lopressor 25 mg bid  on lipitor 10 mg qhs  4. Check TSh on levo 25 mcg qam increase to levo 50 mcg qam TSH 4.55 elevated 5. Cont prozac 40 mg qd stable  6.  utd flu, prevnar, pna 23  Tdap consider repeat at pharmacy  Consider shingrix in future if has not had   HCV negative 04/11/15   Pap out of age window  mammo [facility] 05/25/17 h/o atypical hyperplasia s/p removal right breast in  2017  DEXA 09/12/15 osteopenia consider repeat in 3-5 years  Colonoscopy Carlean Purl 01/15/16 diverticulosis no repeat was rec due to chronic medical conditions  CT chest ordered former smoker 30 years max 1.5 ppd quit in 2008/2009  H/o bl cataracts   sch fasting labs asap   Former PCP Dr. Cathlean Cower Cards Dr. Stanford Breed   Prev saw Dr. Melvyn Novas will switch to [facility] in [facility] closer to home Provider: Dr. Olivia Mackie McLean-Scocuzza-[facility]

## 2018-03-13 NOTE — Progress Notes (Signed)
Called pt to schedule. She wants appointment to be scheduled wk of 3/2 d/t having a lot of appointments last week.

## 2018-03-17 ENCOUNTER — Encounter: Payer: Self-pay | Admitting: Pulmonary Disease

## 2018-03-17 ENCOUNTER — Ambulatory Visit: Payer: Medicare Other | Admitting: Pulmonary Disease

## 2018-03-17 VITALS — BP 132/88 | HR 64 | Ht 60.0 in | Wt 249.0 lb

## 2018-03-17 DIAGNOSIS — J9611 Chronic respiratory failure with hypoxia: Secondary | ICD-10-CM | POA: Diagnosis not present

## 2018-03-17 DIAGNOSIS — R0609 Other forms of dyspnea: Secondary | ICD-10-CM

## 2018-03-17 DIAGNOSIS — J309 Allergic rhinitis, unspecified: Secondary | ICD-10-CM

## 2018-03-17 DIAGNOSIS — Z86711 Personal history of pulmonary embolism: Secondary | ICD-10-CM

## 2018-03-17 DIAGNOSIS — I350 Nonrheumatic aortic (valve) stenosis: Secondary | ICD-10-CM

## 2018-03-17 DIAGNOSIS — Z87891 Personal history of nicotine dependence: Secondary | ICD-10-CM | POA: Diagnosis not present

## 2018-03-17 DIAGNOSIS — J9612 Chronic respiratory failure with hypercapnia: Secondary | ICD-10-CM

## 2018-03-17 DIAGNOSIS — R0600 Dyspnea, unspecified: Secondary | ICD-10-CM

## 2018-03-17 DIAGNOSIS — J449 Chronic obstructive pulmonary disease, unspecified: Secondary | ICD-10-CM | POA: Diagnosis not present

## 2018-03-17 DIAGNOSIS — I5032 Chronic diastolic (congestive) heart failure: Secondary | ICD-10-CM

## 2018-03-17 DIAGNOSIS — Z66 Do not resuscitate: Secondary | ICD-10-CM

## 2018-03-17 MED ORDER — LEVOCETIRIZINE DIHYDROCHLORIDE 5 MG PO TABS: 5.0000 mg | ORAL_TABLET | Freq: Every day | ORAL | 3 refills | Status: DC | PRN

## 2018-03-17 MED ORDER — FLUTICASONE-UMECLIDIN-VILANT 100-62.5-25 MCG/INH IN AEPB: 1.0000 | INHALATION_SPRAY | Freq: Every day | RESPIRATORY_TRACT | 5 refills | Status: DC

## 2018-03-17 MED ORDER — FLUTICASONE-UMECLIDIN-VILANT 100-62.5-25 MCG/INH IN AEPB: 1.0000 | INHALATION_SPRAY | Freq: Every day | RESPIRATORY_TRACT | 0 refills | Status: AC

## 2018-03-17 NOTE — Patient Instructions (Addendum)
Continue Trelegy inhaler - one inhalation daily. Rinse mouth after use.  We will help you obtain assistance so that you can afford this medication  Continue Xopenex (inhaler or nebulizer) as needed for increased shortness of breath, wheezing, chest tightness, cough  Continue oxygen therapy 24 hours/day  When you meet with Dr. Stanford Breed, discuss possible repeat echocardiogram to assess heart valve and blood pressure and lungs  Follow-up in 3 months.  Call sooner if needed

## 2018-03-23 ENCOUNTER — Other Ambulatory Visit: Payer: Self-pay

## 2018-03-23 ENCOUNTER — Ambulatory Visit
Admission: RE | Admit: 2018-03-23 | Discharge: 2018-03-23 | Disposition: A | Discharge status: Home / Self Care | Payer: Medicare Other | Source: Ambulatory Visit | Attending: Internal Medicine | Admitting: Internal Medicine

## 2018-03-23 DIAGNOSIS — J439 Emphysema, unspecified: Secondary | ICD-10-CM

## 2018-03-23 DIAGNOSIS — R05 Cough: Secondary | ICD-10-CM

## 2018-03-23 DIAGNOSIS — R053 Chronic cough: Secondary | ICD-10-CM

## 2018-03-24 ENCOUNTER — Other Ambulatory Visit: Payer: Self-pay | Admitting: Pulmonary Disease

## 2018-03-24 ENCOUNTER — Telehealth: Payer: Self-pay | Admitting: Cardiology

## 2018-03-24 MED ORDER — PROPAFENONE HCL ER 325 MG PO CP12: 325.0000 mg | ORAL_CAPSULE | Freq: Two times a day (BID) | ORAL | 1 refills | Status: DC

## 2018-03-24 NOTE — Telephone Encounter (Signed)
Called and  Spoke to [place] with [facility]. Jenny Reichmann is requesting clarification on Symbicort Rx I have made John aware that per 03/17/18 AVS, pt is to continue Trelegy.  I have made John aware that symbicort could be discontinued, as pt should not be taking both Symbicort and trelegy.  John states that Trelegy is covered with 150 co-pay for 3 months. John stated that [facility] would contact pt prior to shipping this medication out. Nothing further is needed at this time.

## 2018-03-24 NOTE — Telephone Encounter (Signed)
New Message   Pt c/o medication issue:  1. Name of Medication: Propafenone  2. How are you currently taking this medication (dosage and times per day)? 325mg  twice a day    3. Are you having a reaction (difficulty breathing--STAT)? NO  4. What is your medication issue? Patient needs medication refilled but state it's too expensive since she switch insurances wants to know if there is any other medication she can get that's cheaper.

## 2018-03-24 NOTE — Telephone Encounter (Signed)
Spoke with patient and suggested [facility] since her insurance copay is $150 for 30 day supply. Rx sent to [facility] in [place] so she can use [facility]

## 2018-03-25 NOTE — Progress Notes (Signed)
PULMONARY CONSULT NOTE  Requesting MD/Service: Self Date of initial consultation: 03/17/18 Reason for consultation: Chronic hypoxemic and hypercarbic respiratory failure  PT PROFILE: 68 y.o. female former smoker previously followed by Dr Melvyn Novas with chronic hypoxemic/hypercarbic respiratory failure due to COPD, morbid obesity, OHS, history of pulmonary embolism, moderate PAH, aortic stenosis, HFpEF  DATA: 04/19/12 PFTs: FVC: 2.12 > 2.35 L (72 > 80 %pred), FEV1: 1.08 > 1.28 L (50 > 60 %pred), FEV1/FVC: 51%, TLC: 4.18 L (89 %pred), DLCO 42 %pred, DLCO/VA 99% predicted    INTERVAL:  HPI:  She has previously been followed for years by Dr. Melvyn Novas.  She wishes to establish care closer to her home.  She notes chronic, severe exertional dyspnea (class III-IV) with little day-to-day variation.  She suffered a pulmonary embolism in 2008 and has been on oxygen therapy since that time.  She also has aortic stenosis, followed by Dr. Stanford Breed.  At her baseline, she is able to do ADLs with some difficulty.  She has been DNR for some time and is well educated regarding the implications of this decision.  She was recently given a Trelegy inhaler which she believes has been very beneficial.  However, she is unable to afford it.  In fact, she has difficulty affording all of her maintenance inhalers that have been prescribed in the past.  She smoked from age 43 until age 19.  At her peak, she smoked approximately 1 0.5 packs of cigarettes per day.  She was also employed at [employer] with exposure to cotton dusts.  She has no exotic pets.  She has not traveled to third world countries.  Presently, she denies significant cough, sputum production, hemoptysis, chest pain, fever.  Past Medical History:  Diagnosis Date  . Anemia    hx  . Anxiety   . Arthritis   . Asthma   . Atrial fibrillation and flutter ([place])   . Atypical lobular hyperplasia of right breast 05/09/2015  . Breast CA ([place])    ?  Marland Kitchen Chronic  rhinitis   . COPD (chronic obstructive pulmonary disease) ([place])    [place]. PFTs 07/25/06 FEV1 55% ratio 53, DLC0 51% HFA 50% 12/16/2009  . Depression   . FRACTURE, RIB, RIGHT 06/18/2009  . Glucose intolerance (impaired glucose tolerance)    on steroids  . History of DVT (deep vein thrombosis)   . Hx of cardiac catheterization    LHC (01/2001):  Normal Cors.  EF 65%.;  LexiScan Myoview (03/2013): No ischemia, EF 61%, normal  . Hx of echocardiogram    Echo (11/2011):  Mod LVH, EF 60-65%, no RWMA, MAC, mild LAE, PASP 34.  Marland Kitchen HYPERLIPIDEMIA   . Hypertension   . Hypothyroidism   . Morbid obesity ([place])    target wt=179lb for BMI<30 Peak wt 282lb  . Osteoporosis    last BMD 4/08 -2.4, intolerant of bisphosphonates  . Peripheral vascular disease (HCC)    hx dvt.pe  . Pneumonia    hx  . PONV (postoperative nausea and vomiting)   . PREMATURE VENTRICULAR CONTRACTIONS   . PULMONARY EMBOLISM, HX OF 06/19/2007  . Recurrent upper respiratory infection (URI)   . Rhabdomyolysis 07/29/2009  . Tremor   . VITAMIN D DEFICIENCY     Past Surgical History:  Procedure Laterality Date  . A-FLUTTER ABLATION N/A 10/08/2016   Procedure: A-FLUTTER ABLATION;  Surgeon: Evans Lance, MD;  Location: [facility];  Service: Cardiovascular;  Laterality: N/A;  . ABDOMINAL HYSTERECTOMY    .  BREAST LUMPECTOMY WITH RADIOACTIVE SEED LOCALIZATION Right 06/05/2015   Procedure: RIGHT BREAST LUMPECTOMY WITH RADIOACTIVE SEED LOCALIZATION;  Surgeon: Fanny Skates, MD;  Location: [facility];  Service: General;  Laterality: Right;  . BREAST SURGERY Left    s/p mass removal  . CARDIOVERSION N/A 12/11/2014   Procedure: CARDIOVERSION;  Surgeon: Josue Hector, MD;  Location: [facility];  Service: Cardiovascular;  Laterality: N/A;  . CARDIOVERSION N/A 09/15/2016   Procedure: CARDIOVERSION;  Surgeon: Jerline Pain, MD;  Location: [facility];  Service: Cardiovascular;  Laterality: N/A;  . COLONOSCOPY  10/22/2003, ?2006  .  COLONOSCOPY WITH PROPOFOL N/A 01/15/2016   Procedure: COLONOSCOPY WITH PROPOFOL;  Surgeon: Gatha Mayer, MD;  Location: [facility];  Service: Endoscopy;  Laterality: N/A;  . ESOPHAGOGASTRODUODENOSCOPY (EGD) WITH PROPOFOL N/A 01/15/2016   Procedure: ESOPHAGOGASTRODUODENOSCOPY (EGD) WITH PROPOFOL;  Surgeon: Gatha Mayer, MD;  Location: [facility];  Service: Endoscopy;  Laterality: N/A;  . s/p left arm fracture with fall off chair    . skin graft to middle R finger  1975  . TEE WITHOUT CARDIOVERSION N/A 12/11/2014   Procedure: TRANSESOPHAGEAL ECHOCARDIOGRAM (TEE);  Surgeon: Josue Hector, MD;  Location: [facility] ENDOSCOPY;  Service: Cardiovascular;  Laterality: N/A;  . TONSILLECTOMY      MEDICATIONS: I have reviewed all medications and confirmed regimen as documented  Social History   Socioeconomic History  . Marital status: Married    Spouse name: Not on file  . Number of children: 3  . Years of education: Not on file  . Highest education level: Not on file  Occupational History  . 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 Financial resource strain: Not on file  . Food insecurity:    Worry: Not on file    Inability: Not on file  . Transportation needs:    Medical: Not on file    Non-medical: Not on file  Tobacco Use  . Smoking status: Former Smoker    Packs/day: 1.00    Years: 35.00    Pack years: 35.00    Types: Cigarettes    Last attempt to quit: 01/11/2006    Years since quitting: 12.2  . Smokeless tobacco: Never Used  Substance and Sexual Activity  . Alcohol use: No    Alcohol/week: 0.0 standard drinks  . Drug use: No  . Sexual activity: Yes  Lifestyle  . Physical activity:    Days per week: Not on file    Minutes per session: Not on file  . Stress: Not on file  Relationships  . Social connections:    Talks on phone: Not on file    Gets together: Not on file    Attends religious service: Not on file    Active  member of club or organization: Not on file    Attends meetings of clubs or organizations: Not on file    Relationship status: Not on file  . Intimate partner violence:    Fear of current or ex partner: Not on file    Emotionally abused: Not on file    Physically abused: Not on file    Forced sexual activity: Not on file  Other Topics Concern  . Not on file  Social History Narrative   Married 1 son 2 daughters   Disabled   2 caffeine/day   Past smoker   11/12/2015    Family History  Problem Relation Age of Onset  . Heart disease  Mother   . Liver disease Mother        alcohol related  . Asthma Brother   . Esophageal cancer Brother   . Asthma Son   . Colon cancer Neg Hx   . Stomach cancer Neg Hx   . Pancreatic cancer Neg Hx   . Inflammatory bowel disease Neg Hx   . Allergic rhinitis Neg Hx   . Eczema Neg Hx   . Urticaria Neg Hx     ROS: No fever, myalgias/arthralgias, unexplained weight loss or weight gain No new focal weakness or sensory deficits No otalgia, hearing loss, visual changes, nasal and sinus symptoms, mouth and throat problems No neck pain or adenopathy No abdominal pain, N/V/D, diarrhea, change in bowel pattern No dysuria, change in urinary pattern   Vitals:   03/17/18 1102  BP: 132/88  Pulse: 64  SpO2: 96%  Weight: 249 lb (112.9 kg)  Height: 5' (1.524 m)  3 LPM (pulse)   EXAM:  Gen: Very obese, pleasant No overt respiratory distress HEENT: NCAT, sclera white, oropharynx normal Neck: Supple without LAN.  JVP cannot be visualized Lungs: Breath sounds are diminished throughout without adventitious sounds Cardiovascular: RRR, II/VI systolic murmur radiating to neck Abdomen: Obese, soft, nontender, normal BS Ext: without clubbing, cyanosis, edema Neuro: CNs grossly intact, motor and sensory intact Skin: Limited exam, no lesions noted  DATA:   BMP Latest Ref Rng & Units 03/09/2018 10/27/2017 10/07/2017  Glucose 70 - 99 mg/dL 131(H) 131(H) 130(H)   BUN 6 - 23 mg/dL 14 15 12   Creatinine 0.40 - 1.20 mg/dL 0.80 0.77 0.76  Sodium 135 - 145 mEq/L 144 144 146(H)  Potassium 3.5 - 5.1 mEq/L 4.0 4.0 3.7  Chloride 96 - 112 mEq/L 102 103 103  CO2 19 - 32 mEq/L 35(H) 33(H) 31  Calcium 8.4 - 10.5 mg/dL 8.7 9.4 9.2    CBC Latest Ref Rng & Units 03/09/2018 10/27/2017 10/07/2017  WBC 4.0 - 10.5 K/uL 6.1 6.4 6.0  Hemoglobin 12.0 - 15.0 g/dL 11.1(L) 11.8(L) 11.6(L)  Hematocrit 36.0 - 46.0 % 32.0(L) 34.6(L) 35.0(L)  Platelets 150.0 - 400.0 K/uL 238.0 213.0 228    CXR: No recent film  I have personally reviewed all chest radiographs reported above including CXRs and CT chest unless otherwise indicated  IMPRESSION:     ICD-10-CM   1. Chronic respiratory failure with hypoxia and hypercapnia (HCC) J96.11    J96.12   2. Former smoker Z87.891   3. Moderate COPD (chronic obstructive pulmonary disease) (HCC) J44.9   4. History of pulmonary embolism 2009 Z86.711   5. Aortic stenosis, graded as moderate 2018 I35.0   6. Morbid obesity ([place]) E66.01   7. Chronic heart failure with preserved ejection fraction (HCC) I50.32   8. Dyspnea on exertion, multifactorial R06.09   9. Allergic rhinitis, unspecified seasonality, unspecified trigger J30.9 levocetirizine (XYZAL) 5 MG tablet  10. DNR (do not resuscitate) Z66      PLAN:  Continue Trelegy inhaler, 1 inhalation daily.  Rinse mouth after use.  We will help her obtain assistance to afford this medication  Continue Xopenex inhaler or nebulizer as needed for increased shortness of breath, wheezing, chest tightness, cough  Continue oxygen therapy as close to 24 hours/day as possible  She is to discuss possible repeat echocardiogram with Dr. Stanford Breed to reassess aortic valve and pulmonary artery pressures  Follow-up in 3 months.  Call sooner if needed   Merton Border, MD PCCM service Mobile [phone] Pager [phone] 03/25/2018 3:06  PM    

## 2018-03-26 ENCOUNTER — Encounter: Payer: Self-pay | Admitting: Internal Medicine

## 2018-03-26 DIAGNOSIS — D3501 Benign neoplasm of right adrenal gland: Secondary | ICD-10-CM | POA: Insufficient documentation

## 2018-03-27 ENCOUNTER — Telehealth: Payer: Self-pay | Admitting: Pulmonary Disease

## 2018-03-27 NOTE — Telephone Encounter (Signed)
Requested sample of Trelegy. Notified her that we have 1 she can have, but none after that due to reps not being able to bring in samples. She questioned whether there was something cheaper he could put her on since she cannot afford her medications. Will send to DS for suggestions.

## 2018-03-27 NOTE — Telephone Encounter (Signed)
Combinations that are equivalent to Trelegy would be:  Any ICS/LABA (Symbicort, Dulera, Advair, [place], AirDuo, Lapoint)     Plus   Any LAMA (Spiriva, Incruse, Tudorza)    OR  Any ICS (Flovent, Arnuity, Asmanex, Qvar, Pulmicort)     Plus  Any LABA/LAMA (Anoro, Stiolto, Utibron)  It is not clear to me that she needs all three classes of medication (ICS, LABA, LAMA). If Anoro or any other LABA/LAMA is significantly less expensive, she may try one of those in place of Trelegy.   I know of no way to figure out which will be most affordable for her...  Thanks Waunita Schooner

## 2018-03-27 NOTE — Telephone Encounter (Signed)
I spoke to patient, let her know that we could switch to combination therapy, but copays would still be close to the same as compared to the Trelegy. Patient stated that she was able to call [facility] and the trelegy has been shipped and she is able to afford it. She does not think she will run out of her sample at this time. Nothing further.

## 2018-03-29 ENCOUNTER — Ambulatory Visit: Payer: Medicare Other

## 2018-03-29 ENCOUNTER — Other Ambulatory Visit: Payer: Self-pay

## 2018-03-29 ENCOUNTER — Encounter: Payer: Medicare Other | Admitting: Internal Medicine

## 2018-03-29 ENCOUNTER — Telehealth: Payer: Self-pay

## 2018-03-29 MED ORDER — FLUTICASONE-UMECLIDIN-VILANT 100-62.5-25 MCG/INH IN AEPB: 1.0000 | INHALATION_SPRAY | Freq: Every day | RESPIRATORY_TRACT | 11 refills | Status: DC

## 2018-03-29 NOTE — Telephone Encounter (Signed)
Called and spoke to patient, let her know that we received the [place] patient assistance application in the mail she filled out. Will get faxed over to [place].

## 2018-03-30 ENCOUNTER — Encounter: Payer: Self-pay | Admitting: Internal Medicine

## 2018-03-30 ENCOUNTER — Ambulatory Visit (INDEPENDENT_AMBULATORY_CARE_PROVIDER_SITE_OTHER): Payer: Medicare Other

## 2018-03-30 ENCOUNTER — Ambulatory Visit (INDEPENDENT_AMBULATORY_CARE_PROVIDER_SITE_OTHER): Payer: Medicare Other | Admitting: Internal Medicine

## 2018-03-30 ENCOUNTER — Other Ambulatory Visit: Payer: Self-pay

## 2018-03-30 VITALS — BP 134/66 | HR 61 | Temp 98.7°F | Resp 20 | Ht 60.0 in | Wt 252.0 lb

## 2018-03-30 VITALS — BP 134/66 | HR 61 | Temp 98.7°F | Ht 60.0 in | Wt 252.0 lb

## 2018-03-30 DIAGNOSIS — I480 Paroxysmal atrial fibrillation: Secondary | ICD-10-CM

## 2018-03-30 DIAGNOSIS — Z0000 Encounter for general adult medical examination without abnormal findings: Secondary | ICD-10-CM

## 2018-03-30 DIAGNOSIS — E039 Hypothyroidism, unspecified: Secondary | ICD-10-CM

## 2018-03-30 DIAGNOSIS — J449 Chronic obstructive pulmonary disease, unspecified: Secondary | ICD-10-CM | POA: Diagnosis not present

## 2018-03-30 DIAGNOSIS — I272 Pulmonary hypertension, unspecified: Secondary | ICD-10-CM | POA: Diagnosis not present

## 2018-03-30 NOTE — Telephone Encounter (Addendum)
Forms have been faxed to [place] patient assistance. Received fax confirmation.

## 2018-03-30 NOTE — Progress Notes (Signed)
Subjective:   [name] [name] is a 68 y.o. female who presents for an Initial Medicare Annual Wellness Visit.  Review of Systems    No ROS.  Medicare Wellness Visit. Additional risk factors are reflected in the social history.  Cardiac Risk Factors include: advanced age (>63mn, >>10women);hypertension     Objective:    Today's Vitals   03/30/18 1030  BP: 134/66  Pulse: 61  Resp: 20  Temp: 98.7 F (37.1 C)  TempSrc: Oral  SpO2: 91%  Weight: 252 lb (114.3 kg)  Height: 5' (1.524 m)   Body mass index is 49.22 kg/m.  Advanced Directives 03/30/2018 10/07/2017 11/18/2016 10/14/2016 10/07/2016 10/06/2016 09/15/2016  Does Patient Have a Medical Advance Directive? Yes _0  No  Type of Advance Directive Out of facility DNR (pink MOST or yellow form) - - - - - -  Does patient want to make changes to medical advance directive? No - Patient declined - - - - - -  Would patient like information on creating a medical advance directive? - No - Patient declined No - Patient declined - No - Patient declined - No - Patient declined  Pre-existing out of facility DNR order (yellow form or pink MOST form) Yellow form placed in chart (order not valid for inpatient use) - - - - - -    Current Medications (verified) Outpatient Encounter Medications as of 03/30/2018  Medication Sig  . apixaban (ELIQUIS) 5 MG TABS tablet Take 1 tablet (5 mg total) by mouth 2 (two) times daily.  .Marland Kitchenatorvastatin (LIPITOR) 10 MG tablet Take 1 tablet (10 mg total) by mouth daily.  .Marland Kitchenazelastine (ASTELIN) 0.1 % nasal spray Place 2 sprays into both nostrils at bedtime as needed for rhinitis. Use in each nostril as directed  . Calcium Carbonate-Vitamin D 600-400 MG-UNIT per tablet Take 1 tablet by mouth daily.   . ferrous sulfate 325 (65 FE) MG tablet Take 1 tablet (325 mg total) by mouth 2 (two) times daily with a meal. With lunch and dinner  . FLUoxetine (PROZAC) 40 MG capsule Take 1 capsule (40 mg total) by mouth daily.   . Fluticasone-Umeclidin-Vilant (TRELEGY ELLIPTA) 100-62.5-25 MCG/INH AEPB Inhale 1 puff into the lungs daily.  . furosemide (LASIX) 80 MG tablet Take 1 tablet in the morning and 1 tablet in the afternoon  . glucosamine-chondroitin 500-400 MG tablet Take 1 tablet by mouth every morning.   .Marland KitchenKLOR-CON M20 20 MEQ tablet Take 1 tablet (20 mEq total) by mouth 2 (two) times daily.  .Marland Kitchenlevalbuterol (XOPENEX) 1.25 MG/3ML nebulizer solution Take 1.25 mg by nebulization every 4 (four) hours. (Patient taking differently: Take 1.25 mg by nebulization every 4 (four) hours as needed for wheezing or shortness of breath. )  . levocetirizine (XYZAL) 5 MG tablet Take 1 tablet (5 mg total) by mouth daily as needed for allergies.  .Marland Kitchenlevothyroxine (SYNTHROID, LEVOTHROID) 50 MCG tablet Take 1 tablet (50 mcg total) by mouth daily before breakfast.  . metoprolol tartrate (LOPRESSOR) 25 MG tablet Take 1 tablet (25 mg total) by mouth 2 (two) times daily.  . OXYGEN Inhale 2.5 L/min into the lungs continuous. And 4 with exertion  . pantoprazole (PROTONIX) 40 MG tablet Take 1 tablet (40 mg total) by mouth daily. Take 30-60 min before first meal of the day  . propafenone (RYTHMOL SR) 325 MG 12 hr capsule Take 1 capsule (325 mg total) by mouth 2 (two) times daily.  . sodium chloride (  OCEAN) 0.65 % SOLN nasal spray Place 2 sprays every 4 (four) hours as needed into both nostrils for congestion.   No facility-administered encounter medications on file as of 03/30/2018.     Allergies (verified) Duloxetine; Penicillins; and Latex   History: Past Medical History:  Diagnosis Date  . Anemia    hx  . Anxiety   . Arthritis   . Asthma   . Atrial fibrillation and flutter ([place])   . Atypical lobular hyperplasia of right breast 05/09/2015  . Breast CA ([place])    ?  Marland Kitchen Chronic rhinitis   . COPD (chronic obstructive pulmonary disease) ([place])    [place]. PFTs 07/25/06 FEV1 55% ratio 53, DLC0 51% HFA 50% 12/16/2009  . Depression   .  FRACTURE, RIB, RIGHT 06/18/2009  . Glucose intolerance (impaired glucose tolerance)    on steroids  . History of DVT (deep vein thrombosis)   . Hx of cardiac catheterization    LHC (01/2001):  Normal Cors.  EF 65%.;  LexiScan Myoview (03/2013): No ischemia, EF 61%, normal  . Hx of echocardiogram    Echo (11/2011):  Mod LVH, EF 60-65%, no RWMA, MAC, mild LAE, PASP 34.  Marland Kitchen HYPERLIPIDEMIA   . Hypertension   . Hypothyroidism   . Morbid obesity ([place])    target wt=179lb for BMI<30 Peak wt 282lb  . Osteoporosis    last BMD 4/08 -2.4, intolerant of bisphosphonates  . Peripheral vascular disease ([facility])    hx dvt.pe  . Pneumonia    hx  . PONV (postoperative nausea and vomiting)   . PREMATURE VENTRICULAR CONTRACTIONS   . PULMONARY EMBOLISM, HX OF 06/19/2007  . Recurrent upper respiratory infection (URI)   . Rhabdomyolysis 07/29/2009  . Tremor   . VITAMIN D DEFICIENCY    Past Surgical History:  Procedure Laterality Date  . A-FLUTTER ABLATION N/A 10/08/2016   Procedure: A-FLUTTER ABLATION;  Surgeon: Evans Lance, MD;  Location: [facility];  Service: Cardiovascular;  Laterality: N/A;  . ABDOMINAL HYSTERECTOMY    . BREAST LUMPECTOMY WITH RADIOACTIVE SEED LOCALIZATION Right 06/05/2015   Procedure: RIGHT BREAST LUMPECTOMY WITH RADIOACTIVE SEED LOCALIZATION;  Surgeon: Fanny Skates, MD;  Location: [facility];  Service: General;  Laterality: Right;  . BREAST SURGERY Left    s/p mass removal  . CARDIOVERSION N/A 12/11/2014   Procedure: CARDIOVERSION;  Surgeon: Josue Hector, MD;  Location: [facility];  Service: Cardiovascular;  Laterality: N/A;  . CARDIOVERSION N/A 09/15/2016   Procedure: CARDIOVERSION;  Surgeon: Jerline Pain, MD;  Location: [facility];  Service: Cardiovascular;  Laterality: N/A;  . COLONOSCOPY  10/22/2003, ?2006  . COLONOSCOPY WITH PROPOFOL N/A 01/15/2016   Procedure: COLONOSCOPY WITH PROPOFOL;  Surgeon: Gatha Mayer, MD;  Location: [facility];  Service: Endoscopy;   Laterality: N/A;  . ESOPHAGOGASTRODUODENOSCOPY (EGD) WITH PROPOFOL N/A 01/15/2016   Procedure: ESOPHAGOGASTRODUODENOSCOPY (EGD) WITH PROPOFOL;  Surgeon: Gatha Mayer, MD;  Location: [facility];  Service: Endoscopy;  Laterality: N/A;  . s/p left arm fracture with fall off chair    . skin graft to middle R finger  1975  . TEE WITHOUT CARDIOVERSION N/A 12/11/2014   Procedure: TRANSESOPHAGEAL ECHOCARDIOGRAM (TEE);  Surgeon: Josue Hector, MD;  Location: [facility] ENDOSCOPY;  Service: Cardiovascular;  Laterality: N/A;  . TONSILLECTOMY     Family History  Problem Relation Age of Onset  . Heart disease Mother   . Liver disease Mother        alcohol related  . Asthma  Brother   . Esophageal cancer Brother   . Asthma Son   . Colon cancer Neg Hx   . Stomach cancer Neg Hx   . Pancreatic cancer Neg Hx   . Inflammatory bowel disease Neg Hx   . Allergic rhinitis Neg Hx   . Eczema Neg Hx   . Urticaria Neg Hx    Social History   Socioeconomic History  . Marital status: Married    Spouse name: Not on file  . Number of children: 3  . Years of education: Not on file  . Highest education level: Not on file  Occupational History  . 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 Financial resource strain: Not hard at all  . Food insecurity:    Worry: Never true    Inability: Never true  . Transportation needs:    Medical: No    Non-medical: No  Tobacco Use  . Smoking status: Former Smoker    Packs/day: 1.00    Years: 35.00    Pack years: 35.00    Types: Cigarettes    Last attempt to quit: 01/11/2006    Years since quitting: 12.2  . Smokeless tobacco: Never Used  Substance and Sexual Activity  . Alcohol use: No    Alcohol/week: 0.0 standard drinks  . Drug use: No  . Sexual activity: Yes  Lifestyle  . Physical activity:    Days per week: 0 days    Minutes per session: Not on file  . Stress: Not on file  Relationships  . Social  connections:    Talks on phone: Not on file    Gets together: Not on file    Attends religious service: Not on file    Active member of club or organization: Not on file    Attends meetings of clubs or organizations: Not on file    Relationship status: Not on file  Other Topics Concern  . Not on file  Social History Narrative   Married 1 son 2 daughters   Disabled   2 caffeine/day   Past smoker   11/12/2015    Tobacco Counseling Counseling given: Not Answered   Clinical Intake:  Pre-visit preparation completed: Yes        Diabetes: No  How often do you need to have someone help you when you read instructions, pamphlets, or other written materials from your doctor or pharmacy?: 1 - Never  Interpreter Needed?: No      Activities of Daily Living In your present state of health, do you have any difficulty performing the following activities: 03/30/2018  Hearing? Y  Comment Hearing loss R ear  Vision? N  Difficulty concentrating or making decisions? N  Walking or climbing stairs? Y  Dressing or bathing? N  Doing errands, shopping? N  Preparing Food and eating ? N  Using the Toilet? N  In the past six months, have you accidently leaked urine? N  Do you have problems with loss of bowel control? N  Managing your Medications? N  Managing your Finances? N  Housekeeping or managing your Housekeeping? N  Some recent data might be hidden     Immunizations and Health Maintenance Immunization History  Administered Date(s) Administered  . Influenza Split 10/14/2010, 10/13/2011  . Influenza Whole 10/02/2007, 10/14/2008, 10/16/2009  . Influenza, High Dose Seasonal PF 10/09/2016, 10/27/2017  . Influenza, Seasonal, Injecte, Preservative Fre 11/01/2013  . Influenza,inj,Quad PF,6+ Mos 10/17/2012, 10/17/2015  . Pneumococcal  Conjugate-13 10/17/2012, 01/03/2013  . Pneumococcal Polysaccharide-23 09/12/2006, 07/27/2016  . Td 12/05/2007  . Zoster 10/14/2010   Health  Maintenance Due  Topic Date Due  . Samul Dada  12/04/2017    Patient Care Team: McLean-Scocuzza, Nino Glow, MD as PCP - General (Internal Medicine) Stanford Breed Denice Bors, MD as PCP - Cardiology (Cardiology) Tanda Rockers, MD (Pulmonary Disease)  Indicate any recent Medical Services you may have received from other than [facility] providers in the past year (date may be approximate).     Assessment:   This is a routine wellness examination for Coleen.  Oxygen in use at 2.5L while home. 3L when ambulating.   Health Screenings  Mammogram -05/29/18 Colonoscopy -01/15/16 Bone Density -09/12/15 Glaucoma -none Hearing -hearing loss in R ear.  Hemoglobin A1C -10/27/17 (4.6) Cholesterol -03/09/18 (146)  Social  Alcohol intake -no Smoking history- former Smokers in home? none Illicit drug use? none Exercise -none. Plans to walk for exercise. Diet -regular Sexually Active -yes Multiple Partners -no  Safety  Patient feels safe at home.  Patient does have smoke detectors at home. Patient does wear sunscreen or protective clothing when in direct sunlight  Patient does wear seat belt when driving or riding with others.   Activities of Daily Living Patient can do their own household chores. Denies needing assistance with: driving, feeding themselves, getting from bed to chair, getting to the toilet, bathing/showering, dressing, managing money, or preparing meals.   Depression Screen Patient denies losing interest in daily life, feeling hopeless, or crying easily over simple problems. Taking medication as prescribed.  Fall Screen Patient denies being afraid of falling or falling in the last year.   Memory Screen Patient denies problems with memory, misplacing items, and is able to balance checkbook/bank accounts. Patient is alert, normal appearance, oriented to person/place/and time. Correctly identified the president of the [place], recall of 2/3 objects, and performing simple calculations.   Patient displays appropriate judgement and can read correct time from watch face.   Immunizations The following Immunizations are up to date: Influenza, shingles, pneumonia, and tetanus.   Other Providers Patient Care Team: McLean-Scocuzza, Nino Glow, MD as PCP - General (Internal Medicine) Stanford Breed Denice Bors, MD as PCP - Cardiology (Cardiology) Tanda Rockers, MD (Pulmonary Disease)  Hearing/Vision screen Hearing Screening Comments: Patient is able to hear conversational tones without difficulty.  No issues reported.   Vision Screening Comments: Followed by [facility] Wears corrective lenses Last OV 11/2017 Cataract extraction, bilateral Visual acuity not assessed per patient preference since they have regular follow up with the ophthalmologist  Dietary issues and exercise activities discussed: Current Exercise Habits: The patient does not participate in regular exercise at present  Goals      Patient Stated   . Increase physical activity (pt-stated)     Walk for exercise as tolerated.      Depression Screen PHQ 2/9 Scores 03/30/2018 05/13/2017 01/19/2017 06/09/2016 01/31/2014 01/03/2013 10/17/2012  PHQ - 2 Score 0 0 _0 PHQ- 9 Score - - - 4 - - -    Fall Risk Fall Risk  03/30/2018 05/13/2017 01/19/2017 06/09/2016 01/31/2014  Falls in the past year? 0 No No No Yes  Number falls in past yr: - - - - 1  Injury with Fall? - - - - No   Cognitive Function:     6CIT Screen 03/30/2018  What Year? 0 points  What month? 0 points  What time? 0 points  Count back  from 20 0 points  Months in reverse 0 points  Repeat phrase 0 points  Total Score 0    Screening Tests Health Maintenance  Topic Date Due  . TETANUS/TDAP  12/04/2017  . MAMMOGRAM  05/13/2019  . COLONOSCOPY  01/14/2026  . INFLUENZA VACCINE  Completed  . DEXA SCAN  Completed  . Hepatitis C Screening  Completed  . PNA vac Low Risk Adult  Completed     Plan:    End of life planning; Advance aging; Advanced  directives discussed. Copy of current HCPOA/Living Will on file.    I have personally reviewed and noted the following in the patient's chart:   . Medical and social history . Use of alcohol, tobacco or illicit drugs  . Current medications and supplements . Functional ability and status . Nutritional status . Physical activity . Advanced directives . List of other physicians . Hospitalizations, surgeries, and ER visits in previous 12 months . Vitals . Screenings to include cognitive, depression, and falls . Referrals and appointments  In addition, I have reviewed and discussed with patient certain preventive protocols, quality metrics, and best practice recommendations. A written personalized care plan for preventive services as well as general preventive health recommendations were provided to patient.     Varney Biles, LPN   02/21/1733

## 2018-03-30 NOTE — Patient Instructions (Addendum)
Levothyroxine 50 mcg check dose you should be on this dose   Robitussin DM or Mucinex DM green label   F/u 6 months sooner if needed    Chronic Obstructive Pulmonary Disease  Chronic obstructive pulmonary disease (COPD) is a long-term (chronic) condition that affects the lungs. COPD is a general term that can be used to describe many different lung problems that cause lung swelling (inflammation) and limit airflow, including chronic bronchitis and emphysema. If you have COPD, your lung function will probably never return to normal. In most cases, it gets worse over time. However, there are steps you can take to slow the progression of the disease and improve your quality of life. What are the causes? This condition may be caused by:  Smoking. This is the most common cause.  Certain genes passed down through families. What increases the risk? The following factors may make you more likely to develop this condition:  Secondhand smoke from cigarettes, pipes, or cigars.  Exposure to chemicals and other irritants such as fumes and dust in the work environment.  Chronic lung conditions or infections. What are the signs or symptoms? Symptoms of this condition include:  Shortness of breath, especially during physical activity.  Chronic cough with a large amount of thick mucus. Sometimes the cough may not have any mucus (dry cough).  Wheezing.  Rapid breaths.  Gray or bluish discoloration (cyanosis) of the skin, especially in your fingers, toes, or lips.  Feeling tired (fatigue).  Weight loss.  Chest tightness.  Frequent infections.  Episodes when breathing symptoms become much worse (exacerbations).  Swelling in the ankles, feet, or legs. This may occur in later stages of the disease. How is this diagnosed? This condition is diagnosed based on:  Your medical history.  A physical exam. You may also have tests, including:  Lung (pulmonary) function tests. This may  include a spirometry test, which measures your ability to exhale properly.  Chest X-ray.  CT scan.  Blood tests. How is this treated? This condition may be treated with:  Medicines. These may include inhaled rescue medicines to treat acute exacerbations as well as long-term, or maintenance, medicines to prevent flare-ups of COPD. ? Bronchodilators help treat COPD by dilating the airways to allow increased airflow and make your breathing more comfortable. ? Steroids can reduce airway inflammation and help prevent exacerbations.  Smoking cessation. If you smoke, your health care provider may ask you to quit, and may also recommend therapy or replacement products to help you quit.  Pulmonary rehabilitation. This may involve working with a team of health care providers and specialists, such as respiratory, occupational, and physical therapists.  Exercise and physical activity. These are beneficial for nearly all people with COPD.  Nutrition therapy to gain weight, if you are underweight.  Oxygen. Supplemental oxygen therapy is only helpful if you have a low oxygen level in your blood (hypoxemia).  Lung surgery or transplant.  Palliative care. This is to help people with COPD feel comfortable when treatment is no longer working. Follow these instructions at home: Medicines  Take over-the-counter and prescription medicines (inhaled or pills) only as told by your health care provider.  Talk to your health care provider before taking any cough or allergy medicines. You may need to avoid certain medicines that dry out your airways. Lifestyle  If you are a smoker, the most important thing that you can do is to stop smoking. Do not use any products that contain nicotine or tobacco, such as  cigarettes and e-cigarettes. If you need help quitting, ask your health care provider. Continuing to smoke will cause the disease to progress faster.  Avoid exposure to things that irritate your lungs,  such as smoke, chemicals, and fumes.  Stay active, but balance activity with periods of rest. Exercise and physical activity will help you maintain your ability to do things you want to do.  Learn and use relaxation techniques to manage stress and to control your breathing.  Get the right amount of sleep and get quality sleep. Most adults need 7 or more hours per night.  Eat healthy foods. Eating smaller, more frequent meals and resting before meals may help you maintain your strength. Controlled breathing Learn and use controlled breathing techniques as directed by your health care provider. Controlled breathing techniques include:  Pursed lip breathing. Start by breathing in (inhaling) through your nose for 1 second. Then, purse your lips as if you were going to whistle and breathe out (exhale) through the pursed lips for 2 seconds.  Diaphragmatic breathing. Start by putting one hand on your abdomen just above your waist. Inhale slowly through your nose. The hand on your abdomen should move out. Then purse your lips and exhale slowly. You should be able to feel the hand on your abdomen moving in as you exhale. Controlled coughing Learn and use controlled coughing to clear mucus from your lungs. Controlled coughing is a series of short, progressive coughs. The steps of controlled coughing are: 1. Lean your head slightly forward. 2. Breathe in deeply using diaphragmatic breathing. 3. Try to hold your breath for 3 seconds. 4. Keep your mouth slightly open while coughing twice. 5. Spit any mucus out into a tissue. 6. Rest and repeat the steps once or twice as needed. General instructions  Make sure you receive all the vaccines that your health care provider recommends, especially the pneumococcal and influenza vaccines. Preventing infection and hospitalization is very important when you have COPD.  Use oxygen therapy and pulmonary rehabilitation if directed to by your health care provider.  If you require home oxygen therapy, ask your health care provider whether you should purchase a pulse oximeter to measure your oxygen level at home.  Work with your health care provider to develop a COPD action plan. This will help you know what steps to take if your condition gets worse.  Keep other chronic health conditions under control as told by your health care provider.  Avoid extreme temperature and humidity changes.  Avoid contact with people who have an illness that spreads from person to person (is contagious), such as viral infections or pneumonia.  Keep all follow-up visits as told by your health care provider. This is important. Contact a health care provider if:  You are coughing up more mucus than usual.  There is a change in the color or thickness of your mucus.  Your breathing is more labored than usual.  Your breathing is faster than usual.  You have difficulty sleeping.  You need to use your rescue medicines or inhalers more often than expected.  You have trouble doing routine activities such as getting dressed or walking around the house. Get help right away if:  You have shortness of breath while you are resting.  You have shortness of breath that prevents you from: ? Being able to talk. ? Performing your usual physical activities.  You have chest pain lasting longer than 5 minutes.  Your skin color is more blue (cyanotic) than usual.  You measure low oxygen saturations for longer than 5 minutes with a pulse oximeter.  You have a fever.  You feel too tired to breathe normally. Summary  Chronic obstructive pulmonary disease (COPD) is a long-term (chronic) condition that affects the lungs.  Your lung function will probably never return to normal. In most cases, it gets worse over time. However, there are steps you can take to slow the progression of the disease and improve your quality of life.  Treatment for COPD may include taking medicines,  quitting smoking, pulmonary rehabilitation, and changes to diet and exercise. As the disease progresses, you may need oxygen therapy, a lung transplant, or palliative care.  To help manage your condition, do not smoke, avoid exposure to things that irritate your lungs, stay up to date on all vaccines, and follow your health care provider's instructions for taking medicines. This information is not intended to replace advice given to you by your health care provider. Make sure you discuss any questions you have with your health care provider. Document Released: 10/07/2004 Document Revised: 06/23/2016 Document Reviewed: 02/02/2016 Elsevier Interactive Patient Education  2019 [employer].

## 2018-03-30 NOTE — Patient Instructions (Addendum)
  Ms. Gintz , Thank you for taking time to come for your Medicare Wellness Visit. I appreciate your ongoing commitment to your health goals. Please review the following plan we discussed and let me know if I can assist you in the future.   These are the goals we discussed: Goals      Patient Stated   . Increase physical activity (pt-stated)     Walk for exercise as tolerated.       This is a list of the screening recommended for you and due dates:  Health Maintenance  Topic Date Due  . Tetanus Vaccine  12/04/2017  . Mammogram  05/13/2019  . Colon Cancer Screening  01/14/2026  . Flu Shot  Completed  . DEXA scan (bone density measurement)  Completed  .  Hepatitis C: One time screening is recommended by Center for Disease Control  (CDC) for  adults born from [birth year] through 1965.   Completed  . Pneumonia vaccines  Completed

## 2018-03-30 NOTE — Progress Notes (Signed)
Chief Complaint  Patient presents with  . Follow-up   F/u  1. Hypothyroidism tsh elevated does not know if has 25 mcg of 50 mcg daily  2. COPD with chronic cough on 2.5 L at home and 3 L today with exertion gets SOB f/u pulmonary trying to get trelegy approved this helps  3. pafib unable to afford rythmol sr 325 messaged cards and also pulm wants echo due to c/w pulm HTN on CT chest 03/2018    Review of Systems  Constitutional: Negative for weight loss.  HENT: Negative for hearing loss.   Eyes: Negative for blurred vision.  Respiratory: Positive for cough. Negative for shortness of breath and wheezing.   Cardiovascular: Negative for chest pain and leg swelling.  Gastrointestinal: Negative for abdominal pain.  Musculoskeletal: Negative for falls.  Skin: Negative for rash.  Neurological: Negative for headaches.  Psychiatric/Behavioral: Negative for depression and memory loss.   Past Medical History:  Diagnosis Date  . Anemia    hx  . Anxiety   . Arthritis   . Asthma   . Atrial fibrillation and flutter ([place])   . Atypical lobular hyperplasia of right breast 05/09/2015  . Breast CA ([place])    ?  Marland Kitchen Chronic rhinitis   . COPD (chronic obstructive pulmonary disease) ([place])    [place]. PFTs 07/25/06 FEV1 55% ratio 53, DLC0 51% HFA 50% 12/16/2009  . Depression   . FRACTURE, RIB, RIGHT 06/18/2009  . Glucose intolerance (impaired glucose tolerance)    on steroids  . History of DVT (deep vein thrombosis)   . Hx of cardiac catheterization    LHC (01/2001):  Normal Cors.  EF 65%.;  LexiScan Myoview (03/2013): No ischemia, EF 61%, normal  . Hx of echocardiogram    Echo (11/2011):  Mod LVH, EF 60-65%, no RWMA, MAC, mild LAE, PASP 34.  Marland Kitchen HYPERLIPIDEMIA   . Hypertension   . Hypothyroidism   . Morbid obesity ([place])    target wt=179lb for BMI<30 Peak wt 282lb  . Osteoporosis    last BMD 4/08 -2.4, intolerant of bisphosphonates  . Peripheral vascular disease ([facility])    hx dvt.pe  . Pneumonia    hx  .  PONV (postoperative nausea and vomiting)   . PREMATURE VENTRICULAR CONTRACTIONS   . PULMONARY EMBOLISM, HX OF 06/19/2007  . Recurrent upper respiratory infection (URI)   . Rhabdomyolysis 07/29/2009  . Tremor   . VITAMIN D DEFICIENCY    Past Surgical History:  Procedure Laterality Date  . A-FLUTTER ABLATION N/A 10/08/2016   Procedure: A-FLUTTER ABLATION;  Surgeon: Evans Lance, MD;  Location: [facility];  Service: Cardiovascular;  Laterality: N/A;  . ABDOMINAL HYSTERECTOMY    . BREAST LUMPECTOMY WITH RADIOACTIVE SEED LOCALIZATION Right 06/05/2015   Procedure: RIGHT BREAST LUMPECTOMY WITH RADIOACTIVE SEED LOCALIZATION;  Surgeon: Fanny Skates, MD;  Location: [facility];  Service: General;  Laterality: Right;  . BREAST SURGERY Left    s/p mass removal  . CARDIOVERSION N/A 12/11/2014   Procedure: CARDIOVERSION;  Surgeon: Josue Hector, MD;  Location: [facility];  Service: Cardiovascular;  Laterality: N/A;  . CARDIOVERSION N/A 09/15/2016   Procedure: CARDIOVERSION;  Surgeon: Jerline Pain, MD;  Location: [facility]OSCOPY;  Service: Cardiovascular;  Laterality: N/A;  . COLONOSCOPY  10/22/2003, ?2006  . COLONOSCOPY WITH PROPOFOL N/A 01/15/2016   Procedure: COLONOSCOPY WITH PROPOFOL;  Surgeon: Gatha Mayer, MD;  Location: [facility];  Service: Endoscopy;  Laterality: N/A;  . ESOPHAGOGASTRODUODENOSCOPY (EGD) WITH PROPOFOL  N/A 01/15/2016   Procedure: ESOPHAGOGASTRODUODENOSCOPY (EGD) WITH PROPOFOL;  Surgeon: Gatha Mayer, MD;  Location: [facility];  Service: Endoscopy;  Laterality: N/A;  . s/p left arm fracture with fall off chair    . skin graft to middle R finger  1975  . TEE WITHOUT CARDIOVERSION N/A 12/11/2014   Procedure: TRANSESOPHAGEAL ECHOCARDIOGRAM (TEE);  Surgeon: Josue Hector, MD;  Location: [facility] ENDOSCOPY;  Service: Cardiovascular;  Laterality: N/A;  . TONSILLECTOMY     Family History  Problem Relation Age of Onset  . Heart disease Mother   . Liver disease Mother         alcohol related  . Asthma Brother   . Esophageal cancer Brother   . Asthma Son   . Colon cancer Neg Hx   . Stomach cancer Neg Hx   . Pancreatic cancer Neg Hx   . Inflammatory bowel disease Neg Hx   . Allergic rhinitis Neg Hx   . Eczema Neg Hx   . Urticaria Neg Hx    Social History   Socioeconomic History  . Marital status: Married    Spouse name: Not on file  . Number of children: 3  . Years of education: Not on file  . Highest education level: Not on file  Occupational History  . 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 Financial resource strain: Not on file  . Food insecurity:    Worry: Not on file    Inability: Not on file  . Transportation needs:    Medical: Not on file    Non-medical: Not on file  Tobacco Use  . Smoking status: Former Smoker    Packs/day: 1.00    Years: 35.00    Pack years: 35.00    Types: Cigarettes    Last attempt to quit: 01/11/2006    Years since quitting: 12.2  . Smokeless tobacco: Never Used  Substance and Sexual Activity  . Alcohol use: No    Alcohol/week: 0.0 standard drinks  . Drug use: No  . Sexual activity: Yes  Lifestyle  . Physical activity:    Days per week: Not on file    Minutes per session: Not on file  . Stress: Not on file  Relationships  . Social connections:    Talks on phone: Not on file    Gets together: Not on file    Attends religious service: Not on file    Active member of club or organization: Not on file    Attends meetings of clubs or organizations: Not on file    Relationship status: Not on file  . Intimate partner violence:    Fear of current or ex partner: Not on file    Emotionally abused: Not on file    Physically abused: Not on file    Forced sexual activity: Not on file  Other Topics Concern  . Not on file  Social History Narrative   Married 1 son 2 daughters   Disabled   2 caffeine/day   Past smoker   11/12/2015   Current Meds   Medication Sig  . apixaban (ELIQUIS) 5 MG TABS tablet Take 1 tablet (5 mg total) by mouth 2 (two) times daily.  Marland Kitchen atorvastatin (LIPITOR) 10 MG tablet Take 1 tablet (10 mg total) by mouth daily.  Marland Kitchen azelastine (ASTELIN) 0.1 % nasal spray Place 2 sprays into both nostrils at bedtime as needed for rhinitis. Use in each nostril as directed  .  Calcium Carbonate-Vitamin D 600-400 MG-UNIT per tablet Take 1 tablet by mouth daily.   . ferrous sulfate 325 (65 FE) MG tablet Take 1 tablet (325 mg total) by mouth 2 (two) times daily with a meal. With lunch and dinner  . FLUoxetine (PROZAC) 40 MG capsule Take 1 capsule (40 mg total) by mouth daily.  . Fluticasone-Umeclidin-Vilant (TRELEGY ELLIPTA) 100-62.5-25 MCG/INH AEPB Inhale 1 puff into the lungs daily.  . furosemide (LASIX) 80 MG tablet Take 1 tablet in the morning and 1 tablet in the afternoon  . glucosamine-chondroitin 500-400 MG tablet Take 1 tablet by mouth every morning.   Marland Kitchen KLOR-CON M20 20 MEQ tablet Take 1 tablet (20 mEq total) by mouth 2 (two) times daily.  Marland Kitchen levalbuterol (XOPENEX) 1.25 MG/3ML nebulizer solution Take 1.25 mg by nebulization every 4 (four) hours. (Patient taking differently: Take 1.25 mg by nebulization every 4 (four) hours as needed for wheezing or shortness of breath. )  . levocetirizine (XYZAL) 5 MG tablet Take 1 tablet (5 mg total) by mouth daily as needed for allergies.  Marland Kitchen levothyroxine (SYNTHROID, LEVOTHROID) 50 MCG tablet Take 1 tablet (50 mcg total) by mouth daily before breakfast.  . metoprolol tartrate (LOPRESSOR) 25 MG tablet Take 1 tablet (25 mg total) by mouth 2 (two) times daily.  . OXYGEN Inhale 2.5 L/min into the lungs continuous. And 4 with exertion  . pantoprazole (PROTONIX) 40 MG tablet Take 1 tablet (40 mg total) by mouth daily. Take 30-60 min before first meal of the day  . propafenone (RYTHMOL SR) 325 MG 12 hr capsule Take 1 capsule (325 mg total) by mouth 2 (two) times daily.  . sodium chloride (OCEAN) 0.65 %  SOLN nasal spray Place 2 sprays every 4 (four) hours as needed into both nostrils for congestion.   Allergies  Allergen Reactions  . Duloxetine Other (See Comments)    REACTION: rhabdomyolysis  . Penicillins Other (See Comments)    SYNCOPE Has patient had a PCN reaction causing immediate rash, facial/tongue/throat swelling, SOB or lightheadedness with hypotension: Yes Has patient had a PCN reaction causing severe rash involving mucus membranes or skin necrosis: No Has patient had a PCN reaction that required hospitalization No Has patient had a PCN reaction occurring within the last 10 years: No If all of the above answers are "NO", then may proceed with Cephalosporin use.   . Latex Rash   Recent Results (from the past 2160 hour(s))  POC Influenza A&B (Binax test)     Status: Normal   Collection Time: 01/25/18  3:51 PM  Result Value Ref Range   Influenza A, POC Negative Negative   Influenza B, POC Negative Negative  Comprehensive metabolic panel     Status: Abnormal   Collection Time: 03/09/18  9:49 AM  Result Value Ref Range   Sodium 144 135 - 145 mEq/L   Potassium 4.0 3.5 - 5.1 mEq/L   Chloride 102 96 - 112 mEq/L   CO2 35 (H) 19 - 32 mEq/L   Glucose, Bld 131 (H) 70 - 99 mg/dL   BUN 14 6 - 23 mg/dL   Creatinine, Ser 0.80 0.40 - 1.20 mg/dL   Total Bilirubin 1.3 (H) 0.2 - 1.2 mg/dL   Alkaline Phosphatase 69 39 - 117 U/L   AST 10 0 - 37 U/L   ALT 9 0 - 35 U/L   Total Protein 6.6 6.0 - 8.3 g/dL   Albumin 4.2 3.5 - 5.2 g/dL   Calcium 8.7 8.4 - 10.5  mg/dL   GFR 71.38 >60.00 mL/min  CBC with Differential/Platelet     Status: Abnormal   Collection Time: 03/09/18  9:49 AM  Result Value Ref Range   WBC 6.1 4.0 - 10.5 K/uL   RBC 3.66 (L) 3.87 - 5.11 Mil/uL   Hemoglobin 11.1 (L) 12.0 - 15.0 g/dL   HCT 32.0 (L) 36.0 - 46.0 %   MCV 87.4 78.0 - 100.0 fl   MCHC 34.6 30.0 - 36.0 g/dL   RDW 17.2 (H) 11.5 - 15.5 %   Platelets 238.0 150.0 - 400.0 K/uL   Neutrophils Relative % 62.2  43.0 - 77.0 %   Lymphocytes Relative 29.3 12.0 - 46.0 %   Monocytes Relative 6.7 3.0 - 12.0 %   Eosinophils Relative 1.5 0.0 - 5.0 %   Basophils Relative 0.3 0.0 - 3.0 %   Neutro Abs 3.8 1.4 - 7.7 K/uL   Lymphs Abs 1.8 0.7 - 4.0 K/uL   Monocytes Absolute 0.4 0.1 - 1.0 K/uL   Eosinophils Absolute 0.1 0.0 - 0.7 K/uL   Basophils Absolute 0.0 0.0 - 0.1 K/uL  TSH     Status: Abnormal   Collection Time: 03/09/18  9:49 AM  Result Value Ref Range   TSH 4.55 (H) 0.35 - 4.50 uIU/mL  Iron, TIBC and Ferritin Panel     Status: None   Collection Time: 03/09/18  9:49 AM  Result Value Ref Range   Iron 73 45 - 160 mcg/dL   TIBC 318 250 - 450 mcg/dL (calc)   %SAT 23 16 - 45 % (calc)   Ferritin 77 16 - 288 ng/mL  Lipid panel     Status: None   Collection Time: 03/09/18  9:49 AM  Result Value Ref Range   Cholesterol 146 0 - 200 mg/dL    Comment: ATP III Classification       Desirable:  < 200 mg/dL               Borderline High:  200 - 239 mg/dL          High:  > = 240 mg/dL   Triglycerides 111.0 0.0 - 149.0 mg/dL    Comment: Normal:  <150 mg/dLBorderline High:  150 - 199 mg/dL   HDL 40.30 >39.00 mg/dL   VLDL 22.2 0.0 - 40.0 mg/dL   LDL Cholesterol 83 0 - 99 mg/dL   Total CHOL/HDL Ratio 4     Comment:                Men          Women1/2 Average Risk     3.4          3.3Average Risk          5.0          4.42X Average Risk          9.6          7.13X Average Risk          15.0          11.0                       NonHDL 105.53     Comment: NOTE:  Non-HDL goal should be 30 mg/dL higher than patient's LDL goal (i.e. LDL goal of < 70 mg/dL, would have non-HDL goal of < 100 mg/dL)  Vitamin D (25 hydroxy)     Status: None   Collection Time: 03/09/18  9:49 AM  Result Value Ref Range   VITD 35.54 30.00 - 100.00 ng/mL  Vitamin B12     Status: None   Collection Time: 03/09/18  9:49 AM  Result Value Ref Range   Vitamin B-12 212 211 - 911 pg/mL   Objective  Body mass index is 49.22 kg/m. Wt Readings  from Last 3 Encounters:  03/30/18 252 lb (114.3 kg)  03/17/18 249 lb (112.9 kg)  03/07/18 251 lb (113.9 kg)   Temp Readings from Last 3 Encounters:  03/30/18 98.7 F (37.1 C) (Oral)  03/07/18 98.6 F (37 C) (Oral)  01/25/18 97.9 F (36.6 C) (Oral)   BP Readings from Last 3 Encounters:  03/30/18 134/66  03/17/18 132/88  03/07/18 132/68   Pulse Readings from Last 3 Encounters:  03/30/18 61  03/17/18 64  03/07/18 (!) 54    Physical Exam Vitals signs and nursing note reviewed.  Constitutional:      Appearance: Normal appearance. She is well-developed and well-groomed. She is obese.  HENT:     Head: Normocephalic and atraumatic.     Nose: Nose normal.     Mouth/Throat:     Mouth: Mucous membranes are moist.     Pharynx: Oropharynx is clear.  Eyes:     Conjunctiva/sclera: Conjunctivae normal.     Pupils: Pupils are equal, round, and reactive to light.  Cardiovascular:     Rate and Rhythm: Normal rate and regular rhythm.     Heart sounds: Normal heart sounds. No murmur.  Pulmonary:     Effort: Pulmonary effort is normal.     Breath sounds: Normal breath sounds.  Skin:    General: Skin is warm and dry.  Neurological:     General: No focal deficit present.     Mental Status: She is alert and oriented to person, place, and time. Mental status is at baseline.     Gait: Gait normal.  Psychiatric:        Attention and Perception: Attention and perception normal.        Mood and Affect: Mood and affect normal.        Speech: Speech normal.        Behavior: Behavior normal. Behavior is cooperative.        Thought Content: Thought content normal.        Cognition and Memory: Cognition and memory normal.        Judgment: Judgment normal.     Assessment   1. Hypothyroidism  2. Copd, pulm HTN  3. p Afib  4. HM Plan   1. Make sure has levo 50 mcg dose dont mix with supplements  2. trelegy prn xopenex f/u pulm 3. F/u cards Dr. Stanford Breed 05/16/2018  Consider echo  Advised  cards pt cant afford anti arrythmic drug and needs different option or asst program  4.  utd flu, prevnar, pna 23  Tdap consider repeat at pharmacy given Rx today  Consider shingrix in future if has not had   HCV negative 04/11/15   Pap out of age window  mammo [facility] 05/25/17 h/o atypical hyperplasia s/p removal right breast in 2017  -appt 05/29/2018   DEXA 09/12/15 osteopenia consider repeat in 3-5 years  Colonoscopy Carlean Purl 01/15/16 diverticulosis no repeat was rec due to chronic medical conditions  CT chest ordered former smoker 30 years max 1.5 ppd quit in 2008/2009  CT chest had 02/2018  H/o bl cataracts  Former PCP Dr. Cathlean Cower Cards Dr. Stanford Breed  Provider: Dr. Olivia Mackie McLean-Scocuzza-Internal Medicine

## 2018-03-30 NOTE — Progress Notes (Signed)
Pre visit review using our clinic review tool, if applicable. No additional management support is needed unless otherwise documented below in the visit note. 

## 2018-03-30 NOTE — Progress Notes (Signed)
Agree with note   tMS 

## 2018-03-31 ENCOUNTER — Telehealth: Payer: Self-pay | Admitting: *Deleted

## 2018-03-31 DIAGNOSIS — I272 Pulmonary hypertension, unspecified: Secondary | ICD-10-CM

## 2018-03-31 NOTE — Telephone Encounter (Signed)
-----   Message from Lelon Perla, MD sent at 03/31/2018  7:27 AM EDT ----- Schedule elective fu echo; no urgency Kirk Ruths ----- Message ----- From: McLean-Scocuzza, Nino Glow, MD Sent: 03/30/2018  10:13 AM EDT To: Lelon Perla, MD  Also please consider repeat echo per lung doctors   Thanks [place]

## 2018-04-01 DIAGNOSIS — J449 Chronic obstructive pulmonary disease, unspecified: Secondary | ICD-10-CM | POA: Diagnosis not present

## 2018-04-05 ENCOUNTER — Telehealth: Payer: Self-pay | Admitting: Cardiology

## 2018-04-05 NOTE — Telephone Encounter (Signed)
° ° °  Pt c/o medication issue:  1. Name of Medication: propafenone (RYTHMOL SR) 325 MG 12 hr capsule  2. How are you currently taking this medication (dosage and times per day)? n/a  3. Are you having a reaction (difficulty breathing--STAT)? no  4. What is your medication issue? Patient requesting help with assistance form

## 2018-04-05 NOTE — Telephone Encounter (Signed)
LMTCB

## 2018-04-07 ENCOUNTER — Encounter: Payer: Self-pay | Admitting: *Deleted

## 2018-04-07 MED ORDER — PROPAFENONE HCL 150 MG PO TABS: 150.0000 mg | ORAL_TABLET | Freq: Three times a day (TID) | ORAL | 3 refills | Status: DC

## 2018-04-07 NOTE — Telephone Encounter (Signed)
Follow up:    Patient returning call back from a few days ago. Patient states no one has call  Her Please call patient back.

## 2018-04-07 NOTE — Telephone Encounter (Signed)
With the help of the pharm md patient changed to short acting rythmol 150 mg TID. New script sent to the [facility]. The patient will call and see if affordable and let us know.

## 2018-04-10 ENCOUNTER — Other Ambulatory Visit: Payer: Self-pay

## 2018-04-10 MED ORDER — PROPAFENONE HCL 150 MG PO TABS: 150.0000 mg | ORAL_TABLET | Freq: Three times a day (TID) | ORAL | 3 refills | Status: DC

## 2018-04-12 ENCOUNTER — Other Ambulatory Visit: Payer: Self-pay

## 2018-04-12 ENCOUNTER — Ambulatory Visit: Payer: Self-pay

## 2018-04-12 ENCOUNTER — Encounter: Payer: Self-pay | Admitting: Internal Medicine

## 2018-04-12 ENCOUNTER — Ambulatory Visit (INDEPENDENT_AMBULATORY_CARE_PROVIDER_SITE_OTHER): Payer: Medicare Other | Admitting: Internal Medicine

## 2018-04-12 DIAGNOSIS — J309 Allergic rhinitis, unspecified: Secondary | ICD-10-CM

## 2018-04-12 DIAGNOSIS — J0111 Acute recurrent frontal sinusitis: Secondary | ICD-10-CM

## 2018-04-12 DIAGNOSIS — R001 Bradycardia, unspecified: Secondary | ICD-10-CM

## 2018-04-12 DIAGNOSIS — H04203 Unspecified epiphora, bilateral lacrimal glands: Secondary | ICD-10-CM | POA: Diagnosis not present

## 2018-04-12 DIAGNOSIS — R42 Dizziness and giddiness: Secondary | ICD-10-CM

## 2018-04-12 DIAGNOSIS — I480 Paroxysmal atrial fibrillation: Secondary | ICD-10-CM | POA: Diagnosis not present

## 2018-04-12 DIAGNOSIS — I483 Typical atrial flutter: Secondary | ICD-10-CM

## 2018-04-12 MED ORDER — OLOPATADINE HCL 0.1 % OP SOLN: 1.0000 [drp] | Freq: Two times a day (BID) | OPHTHALMIC | 4 refills | Status: AC

## 2018-04-12 MED ORDER — MONTELUKAST SODIUM 10 MG PO TABS: 10.0000 mg | ORAL_TABLET | Freq: Every day | ORAL | 0 refills | Status: DC

## 2018-04-12 MED ORDER — DOXYCYCLINE HYCLATE 100 MG PO TABS: 100.0000 mg | ORAL_TABLET | Freq: Two times a day (BID) | ORAL | 0 refills | Status: DC

## 2018-04-12 NOTE — Telephone Encounter (Signed)
Returned call to pt.  Reported onset of weakness and light headed feeling before she got out of bed this morning.  Stated the light-headed feeling comes and goes.  Admitted to intermittently feeling the room is spinning and tilting.  C/o intermittent nausea.  Reported one episode last evening, that she became hot and sweating, but denied any other symptoms at that time.  Today, denied any sweating, chest pain, heart palpitations, or shortness of breath.  Stated she feels somewhat weak and off balance.  Stated she is using her walker for balance today.  VS: 12:30 PM BP 142/89, pulse 107 with home BP wrist cuff.  VS: 12:45 PM BP 143/9, pulse 109.  Denied dry mouth, or decreased urination.  Pt stated she thought the Xyzal that she took at bedtime, may have caused the above sx's.  Pt. admitted to taking Xyzal every night, at bedtime, since December.       Reason for Disposition . [1] MODERATE dizziness (e.g., interferes with normal activities) AND [2] has NOT been evaluated by physician for this  (Exception: dizziness caused by heat exposure, sudden standing, or poor fluid intake)  Answer Assessment - Initial Assessment Questions 1. DESCRIPTION: "Describe your dizziness."     Feels off balance  2. LIGHTHEADED: "Do you feel lightheaded?" (e.g., somewhat faint, woozy, weak upon standing)     Denied feeling faint; feels off balance 3. VERTIGO: "Do you feel like either you or the room is spinning or tilting?" (i.e. vertigo)     Yes- admits to room spinning and ceiling tilting 4. SEVERITY: "How bad is it?"  "Do you feel like you are going to faint?" "Can you stand and walk?"   - MILD - walking normally   - MODERATE - interferes with normal activities (e.g., work, school)    - SEVERE - unable to stand, requires support to walk, feels like passing out now.      Moderate to severe when 1st getting up in the morning 5. ONSET:  "When did the dizziness begin?"     This morning before she got out of bed  6.  AGGRAVATING FACTORS: "Does anything make it worse?" (e.g., standing, change in head position)     Bending over 7. HEART RATE: "Can you tell me your heart rate?" "How many beats in 15 seconds?"  (Note: not all patients can do this)       BP 142/89, pulse 107 @ 12:27 PM with home BP monitor 8. CAUSE: "What do you think is causing the dizziness?"     Thinks it is a side effect to the Xyzal; takes Xyzal every night, at bedtime. 9. RECURRENT SYMPTOM: "Have you had dizziness before?" If so, ask: "When was the last time?" "What happened that time?"     Yes in November- went to the ER ; had been exposed to stomach virus 10. OTHER SYMPTOMS: "Do you have any other symptoms?" (e.g., fever, chest pain, vomiting, diarrhea, bleeding)      C/o weakness and feeling off balance, nausea; denied chest discomfort, shortness of breath, or any heart palpitations; no sweating at present; had heavy sweating last night  11. PREGNANCY: "Is there any chance you are pregnant?" "When was your last menstrual period?"       n/a  Protocols used: DIZZINESS [facility]  Message from Leretha Pol sent at 04/12/2018 12:03 PM EDT   Summary: medication problem   Pt called and is concerned that the medication levocetirizine (XYZAL) 5 MG tablet [[id number]] May  be causing her side affects nausea, unable to get up, off balance. Please advise

## 2018-04-12 NOTE — Telephone Encounter (Signed)
Addendum to previous note, as Epic kicked Chief Strategy Officer out of the note.    Advised pt. Will send note to the provider to schedule a virtual visit for evaluation of symptoms.  Pt. Agreed with plan.  Stated she is not good with computers, and would be more comfortable with a Telephonic visit.  Care advice given per protocol.  Pt. Verb. Understanding.

## 2018-04-12 NOTE — Progress Notes (Signed)
Virtual Visit via Video Note  I connected with [name] [name] [birth year] on 04/12/18 at  3:10-3:36 PM EDT by a video enabled telemedicine application and verified that I am speaking with the correct person using two identifiers. Location patient: home Location provider:work  Persons participating in the virtual visit: patient, provider  I discussed the limitations of evaluation and management by telemedicine and the availability of in person appointments. The patient expressed understanding and agreed to proceed.   HPI: 1. Dizziness and sinus pressure frontal (x 2-3 days) and nausea noted this am 04/12/2018. She woke up off balance and the room was not really spinning. This is the 2nd time this has happened since 11/2017.  She was tested for the flu and negative.   She thinks dizziness related to xyzal which is new since 03/2018 she takes 5 mg at night. Reviewed side effects <1% chance dizziness and nausea for this medication. Only other new medication change is increase in levothyroxine to 50 mcg qd. She denies chest pain  Reviewed CT head 09/2016 left mucosal thickening chronic changes no infarct otherwise negative   2. Variable HR from 50s to 107-109 at home with device she checks. Will [facility] cardiology Dr. Stanford Breed upcoming appt 05/16/2018 h/o Afib/Flutter 3. Allergies worse using nose sprays I.e astelin previously on singulair but not sure if made a difference in the past. Nose is runny with clear nasal discharge, watery eyes and denies chest congestion  4. COPD on 2.5 L O2 today O2 sat 98%  ROS: See pertinent positives and negatives per HPI.  Past Medical History:  Diagnosis Date  . Anemia    hx  . Anxiety   . Arthritis   . Asthma   . Atrial fibrillation and flutter ([place])   . Atypical lobular hyperplasia of right breast 05/09/2015  . Breast CA ([place])    ?  Marland Kitchen Chronic rhinitis   . COPD (chronic obstructive pulmonary disease) ([place])    [facility]. PFTs 07/25/06 FEV1 55% ratio 53, DLC0 51% HFA 50%  12/16/2009  . Depression   . FRACTURE, RIB, RIGHT 06/18/2009  . Glucose intolerance (impaired glucose tolerance)    on steroids  . History of DVT (deep vein thrombosis)   . Hx of cardiac catheterization    LHC (01/2001):  Normal Cors.  EF 65%.;  LexiScan Myoview (03/2013): No ischemia, EF 61%, normal  . Hx of echocardiogram    Echo (11/2011):  Mod LVH, EF 60-65%, no RWMA, MAC, mild LAE, PASP 34.  Marland Kitchen HYPERLIPIDEMIA   . Hypertension   . Hypothyroidism   . Morbid obesity ([place])    target wt=179lb for BMI<30 Peak wt 282lb  . Osteoporosis    last BMD 4/08 -2.4, intolerant of bisphosphonates  . Peripheral vascular disease (HCC)    hx dvt.pe  . Pneumonia    hx  . PONV (postoperative nausea and vomiting)   . PREMATURE VENTRICULAR CONTRACTIONS   . PULMONARY EMBOLISM, HX OF 06/19/2007  . Recurrent upper respiratory infection (URI)   . Rhabdomyolysis 07/29/2009  . Tremor   . VITAMIN D DEFICIENCY     Past Surgical History:  Procedure Laterality Date  . A-FLUTTER ABLATION N/A 10/08/2016   Procedure: A-FLUTTER ABLATION;  Surgeon: Evans Lance, MD;  Location: [facility];  Service: Cardiovascular;  Laterality: N/A;  . ABDOMINAL HYSTERECTOMY    . BREAST LUMPECTOMY WITH RADIOACTIVE SEED LOCALIZATION Right 06/05/2015   Procedure: RIGHT BREAST LUMPECTOMY WITH RADIOACTIVE SEED LOCALIZATION;  Surgeon: Fanny Skates, MD;  Location:  [facility];  Service: General;  Laterality: Right;  . BREAST SURGERY Left    s/p mass removal  . CARDIOVERSION N/A 12/11/2014   Procedure: CARDIOVERSION;  Surgeon: Josue Hector, MD;  Location: [facility];  Service: Cardiovascular;  Laterality: N/A;  . CARDIOVERSION N/A 09/15/2016   Procedure: CARDIOVERSION;  Surgeon: Jerline Pain, MD;  Location: [facility]OSCOPY;  Service: Cardiovascular;  Laterality: N/A;  . COLONOSCOPY  10/22/2003, ?2006  . COLONOSCOPY WITH PROPOFOL N/A 01/15/2016   Procedure: COLONOSCOPY WITH PROPOFOL;  Surgeon: Gatha Mayer, MD;  Location: [facility]  ENDOSCOPY;  Service: Endoscopy;  Laterality: N/A;  . ESOPHAGOGASTRODUODENOSCOPY (EGD) WITH PROPOFOL N/A 01/15/2016   Procedure: ESOPHAGOGASTRODUODENOSCOPY (EGD) WITH PROPOFOL;  Surgeon: Gatha Mayer, MD;  Location: [facility]OS[facility];  Service: Endoscopy;  Laterality: N/A;  . s/p left arm fracture with fall off chair    . skin graft to middle R finger  1975  . TEE WITHOUT CARDIOVERSION N/A 12/11/2014   Procedure: TRANSESOPHAGEAL ECHOCARDIOGRAM (TEE);  Surgeon: Josue Hector, MD;  Location: [facility] ENDOSCOPY;  Service: Cardiovascular;  Laterality: N/A;  . TONSILLECTOMY      Family History  Problem Relation Age of Onset  . Heart disease Mother   . Liver disease Mother        alcohol related  . Asthma Brother   . Esophageal cancer Brother   . Asthma Son   . Colon cancer Neg Hx   . Stomach cancer Neg Hx   . Pancreatic cancer Neg Hx   . Inflammatory bowel disease Neg Hx   . Allergic rhinitis Neg Hx   . Eczema Neg Hx   . Urticaria Neg Hx     SOCIAL HX:  Married    Current Outpatient Medications:  .  apixaban (ELIQUIS) 5 MG TABS tablet, Take 1 tablet (5 mg total) by mouth 2 (two) times daily., Disp: 180 tablet, Rfl: 1 .  atorvastatin (LIPITOR) 10 MG tablet, Take 1 tablet (10 mg total) by mouth daily., Disp: 90 tablet, Rfl: 1 .  azelastine (ASTELIN) 0.1 % nasal spray, Place 2 sprays into both nostrils at bedtime as needed for rhinitis. Use in each nostril as directed, Disp: 90 mL, Rfl: 3 .  Calcium Carbonate-Vitamin D 600-400 MG-UNIT per tablet, Take 1 tablet by mouth daily. , Disp: , Rfl:  .  doxycycline (VIBRA-TABS) 100 MG tablet, Take 1 tablet (100 mg total) by mouth 2 (two) times daily. With food, Disp: 14 tablet, Rfl: 0 .  ferrous sulfate 325 (65 FE) MG tablet, Take 1 tablet (325 mg total) by mouth 2 (two) times daily with a meal. With lunch and dinner, Disp: 180 tablet, Rfl: 3 .  FLUoxetine (PROZAC) 40 MG capsule, Take 1 capsule (40 mg total) by mouth daily., Disp: 90 capsule, Rfl: 1 .   Fluticasone-Umeclidin-Vilant (TRELEGY ELLIPTA) 100-62.5-25 MCG/INH AEPB, Inhale 1 puff into the lungs daily., Disp: 60 each, Rfl: 11 .  furosemide (LASIX) 80 MG tablet, Take 1 tablet in the morning and 1 tablet in the afternoon, Disp: 180 tablet, Rfl: 1 .  glucosamine-chondroitin 500-400 MG tablet, Take 1 tablet by mouth every morning. , Disp: , Rfl:  .  KLOR-CON M20 20 MEQ tablet, Take 1 tablet (20 mEq total) by mouth 2 (two) times daily., Disp: 180 tablet, Rfl: 1 .  levalbuterol (XOPENEX) 1.25 MG/3ML nebulizer solution, Take 1.25 mg by nebulization every 4 (four) hours. (Patient taking differently: Take 1.25 mg by nebulization every 4 (four) hours as needed for wheezing or shortness  of breath. ), Disp: 360 mL, Rfl: 11 .  levocetirizine (XYZAL) 5 MG tablet, Take 1 tablet (5 mg total) by mouth daily as needed for allergies., Disp: 90 tablet, Rfl: 3 .  levothyroxine (SYNTHROID, LEVOTHROID) 50 MCG tablet, Take 1 tablet (50 mcg total) by mouth daily before breakfast., Disp: 90 tablet, Rfl: 3 .  metoprolol tartrate (LOPRESSOR) 25 MG tablet, Take 1 tablet (25 mg total) by mouth 2 (two) times daily., Disp: 180 tablet, Rfl: 3 .  montelukast (SINGULAIR) 10 MG tablet, Take 1 tablet (10 mg total) by mouth at bedtime., Disp: 30 tablet, Rfl: 0 .  olopatadine (PATANOL) 0.1 % ophthalmic solution, Place 1 drop into both eyes 2 (two) times daily., Disp: 15 mL, Rfl: 4 .  OXYGEN, Inhale 2.5 L/min into the lungs continuous. And 4 with exertion, Disp: , Rfl:  .  pantoprazole (PROTONIX) 40 MG tablet, Take 1 tablet (40 mg total) by mouth daily. Take 30-60 min before first meal of the day, Disp: 90 tablet, Rfl: 3 .  propafenone (RYTHMOL) 150 MG tablet, Take 1 tablet (150 mg total) by mouth every 8 (eight) hours., Disp: 270 tablet, Rfl: 3 .  sodium chloride (OCEAN) 0.65 % SOLN nasal spray, Place 2 sprays every 4 (four) hours as needed into both nostrils for congestion., Disp: , Rfl:   EXAM:  VITALS per patient if  applicable: BP 696/29 pulse 52-53, O2 sat 98% on 2.5L checked by pt at home  GENERAL: alert, oriented, appears well and in no acute distress  HEENT: atraumatic, conjunttiva clear, no obvious abnormalities on inspection of external nose and ears  NECK: normal movements of the head and neck  LUNGS: on inspection no signs of respiratory distress, breathing rate appears normal, no obvious gross SOB, gasping or wheezing  CV: no obvious cyanosis  MS: moves all visible extremities without noticeable abnormality  PSYCH/NEURO: pleasant and cooperative, no obvious depression or anxiety, speech and thought processing grossly intact  ASSESSMENT AND PLAN:  Discussed the following assessment and plan: 1. Dizziness ddx sinusitis, cardiac related with variable HR 50s to >100s, neurologic If continues will order stat CT head or go to ED tx sinus disease as below with doxycycline and NS, Astelin  Try to cut Xyzal from 5 mg qhs to 2.5 mg qhs  Pt to call back in 1 week if sx's persist Increase hydration with water   2. Allergic rhinitis/Watery eyes - Plan:  olopatadine (PATANOL) 0.1 % ophthalmic solution refilled today optum Add back sinuglair Cont NS and Astelin  3. Acute recurrent frontal sinusitis - Plan: montelukast (SINGULAIR) 10 MG tablet, doxycycline (VIBRA-TABS) 100 MG tablet bid x 1 week   4. PAF (paroxysmal atrial fibrillation) (HCC)/Aflutter/Bradycardia ? If symptomatic with dizziness  -f/u with Dr. Stanford Breed will message him about variable HR     I discussed the assessment and treatment plan with the patient. The patient was provided an opportunity to ask questions and all were answered. The patient agreed with the plan and demonstrated an understanding of the instructions.   The patient was advised to call back or seek an in-person evaluation if the symptoms worsen or if the condition fails to improve as anticipated.  I provided 25 minutes of non-face-to-face time during this  encounter.   Nino Glow McLean-Scocuzza, MD

## 2018-04-13 ENCOUNTER — Telehealth: Payer: Self-pay | Admitting: *Deleted

## 2018-04-13 NOTE — Telephone Encounter (Signed)
Spoke with pt, her bp today around 2 pm was 194/75 pulse 78 and this morning 139/75 pulse of 50. She is currently out of rythmol for the last pill this morning. She does not feel like she is going back into atrial fib. She is having sinus issues and she thinks the off balance is due to sinus issues. She can tell when she goes into atrial fib. We will keep the follow up for may right now. She does not feel she needs to be seen at this time.

## 2018-04-13 NOTE — Telephone Encounter (Signed)
-----   Message from Lelon Perla, MD sent at 04/13/2018  7:24 AM EDT ----- Regarding: FW: Afib/flutter/bradycardia/tachycardia Please check with pt to see if she needs ov or how she feels [place] ----- Message ----- From: McLean-Scocuzza, Nino Glow, MD Sent: 04/12/2018   5:20 PM EDT To: Lelon Perla, MD Subject: Afib/flutter/bradycardia/tachycardia           Televisit today pt reports HR variable from 50s  to > 100 with h/o Afib/flutter now with brady tachycardia and she is also having dizziness which ddx for me is cardiac   She has cards f/u 05/16/2018 not sure if you all could coordinate holter or zio for her  Thanks [place]

## 2018-04-17 ENCOUNTER — Telehealth: Payer: Self-pay | Admitting: Internal Medicine

## 2018-04-17 NOTE — Telephone Encounter (Signed)
Patient left voicemail message requesting medication for a cough to be called in to pharmacy. Pt states that the cough has come back. Mucinex is not working and the cough seems to be getting worse.Pt can be contacted at [phone]

## 2018-04-18 ENCOUNTER — Ambulatory Visit (INDEPENDENT_AMBULATORY_CARE_PROVIDER_SITE_OTHER): Payer: Medicare Other | Admitting: Internal Medicine

## 2018-04-18 DIAGNOSIS — J309 Allergic rhinitis, unspecified: Secondary | ICD-10-CM

## 2018-04-18 DIAGNOSIS — R42 Dizziness and giddiness: Secondary | ICD-10-CM | POA: Diagnosis not present

## 2018-04-18 DIAGNOSIS — J449 Chronic obstructive pulmonary disease, unspecified: Secondary | ICD-10-CM | POA: Diagnosis not present

## 2018-04-18 DIAGNOSIS — H9191 Unspecified hearing loss, right ear: Secondary | ICD-10-CM | POA: Diagnosis not present

## 2018-04-18 DIAGNOSIS — J320 Chronic maxillary sinusitis: Secondary | ICD-10-CM | POA: Diagnosis not present

## 2018-04-18 NOTE — Telephone Encounter (Signed)
Patient stated she was calling pulmonary once finish current call with myself to schedule appointment.

## 2018-04-18 NOTE — Telephone Encounter (Signed)
Called and spoke to pt.  Pt c/o having a lingering dry, non-productive cough for the past several months.  Pt also c/o of having allergy symptoms:  running nose, watery eyes, sinus drainage in back of throat, sinus pressure headache, and at times her nose bleeds a little when blowing.  No fever.  No travel.  Denies being around anyone else who has been sick or traveled.    Pt has used nasal spray and Mucinex.  Pt said that she picked up the Rx for eye drops on yesterday.  Pt said that she does have some codeine cough syrup prescribed by Dr. Jenny Reichmann but she knows she cannot take it all the time.    Pt would like a Rx to control and clear up cough.   Pt scheduled phone appt w/ PCP today @ 11:00 am.  Notes forwarded to PCP for review.

## 2018-04-18 NOTE — Progress Notes (Signed)
Telephone Note  I connected with [name] [name] on [birth year] at 11:03 AM EDT by telephone and verified that I am speaking with the correct person using two identifiers. Location patient: home Location provider:work  Persons participating in the virtual visit: patient, provider  I discussed the limitations of evaluation and management by telemedicine and the availability of in person appointments. The patient expressed understanding and agreed to proceed.   HPI: Chronic oough/COPD using Trelegy and allergies appt with pulmonary 04/20/2018 to discuss.   Dizziness has been on doxycycline since 04/12/2018 for sinusitis and still getting dizzy while lying on her left side only. She reports long term hearing loss on right side. She has seen ENT in [place] but never [place] before   Allergies resumed singulair 10 mg qhs and patanol eye drops   ROS: See pertinent positives and negatives per HPI.  Past Medical History:  Diagnosis Date  . Anemia    hx  . Anxiety   . Arthritis   . Asthma   . Atrial fibrillation and flutter ([place])   . Atypical lobular hyperplasia of right breast 05/09/2015  . Breast CA ([place])    ?  Marland Kitchen Chronic rhinitis   . COPD (chronic obstructive pulmonary disease) ([place])    [facility]. PFTs 07/25/06 FEV1 55% ratio 53, DLC0 51% HFA 50% 12/16/2009  . Depression   . FRACTURE, RIB, RIGHT 06/18/2009  . Glucose intolerance (impaired glucose tolerance)    on steroids  . History of DVT (deep vein thrombosis)   . Hx of cardiac catheterization    LHC (01/2001):  Normal Cors.  EF 65%.;  LexiScan Myoview (03/2013): No ischemia, EF 61%, normal  . Hx of echocardiogram    Echo (11/2011):  Mod LVH, EF 60-65%, no RWMA, MAC, mild LAE, PASP 34.  Marland Kitchen HYPERLIPIDEMIA   . Hypertension   . Hypothyroidism   . Morbid obesity ([place])    target wt=179lb for BMI<30 Peak wt 282lb  . Osteoporosis    last BMD 4/08 -2.4, intolerant of bisphosphonates  . Peripheral vascular disease (HCC)    hx dvt.pe  . Pneumonia    hx   . PONV (postoperative nausea and vomiting)   . PREMATURE VENTRICULAR CONTRACTIONS   . PULMONARY EMBOLISM, HX OF 06/19/2007  . Recurrent upper respiratory infection (URI)   . Rhabdomyolysis 07/29/2009  . Tremor   . VITAMIN D DEFICIENCY     Past Surgical History:  Procedure Laterality Date  . A-FLUTTER ABLATION N/A 10/08/2016   Procedure: A-FLUTTER ABLATION;  Surgeon: Evans Lance, MD;  Location: [place] [facility];  Service: Cardiovascular;  Laterality: N/A;  . ABDOMINAL HYSTERECTOMY    . BREAST LUMPECTOMY WITH RADIOACTIVE SEED LOCALIZATION Right 06/05/2015   Procedure: RIGHT BREAST LUMPECTOMY WITH RADIOACTIVE SEED LOCALIZATION;  Surgeon: Fanny Skates, MD;  Location: [facility];  Service: General;  Laterality: Right;  . BREAST SURGERY Left    s/p mass removal  . CARDIOVERSION N/A 12/11/2014   Procedure: CARDIOVERSION;  Surgeon: Josue Hector, MD;  Location: [facility];  Service: Cardiovascular;  Laterality: N/A;  . CARDIOVERSION N/A 09/15/2016   Procedure: CARDIOVERSION;  Surgeon: Jerline Pain, MD;  Location: [facility] ENDOSCOPY;  Service: Cardiovascular;  Laterality: N/A;  . COLONOSCOPY  10/22/2003, ?2006  . COLONOSCOPY WITH PROPOFOL N/A 01/15/2016   Procedure: COLONOSCOPY WITH PROPOFOL;  Surgeon: Gatha Mayer, MD;  Location: [facility]OS[facility];  Service: Endoscopy;  Laterality: N/A;  . ESOPHAGOGASTRODUODENOSCOPY (EGD) WITH PROPOFOL N/A 01/15/2016   Procedure: ESOPHAGOGASTRODUODENOSCOPY (EGD) WITH PROPOFOL;  Surgeon: Gatha Mayer, MD;  Location: [facility];  Service: Endoscopy;  Laterality: N/A;  . s/p left arm fracture with fall off chair    . skin graft to middle R finger  1975  . TEE WITHOUT CARDIOVERSION N/A 12/11/2014   Procedure: TRANSESOPHAGEAL ECHOCARDIOGRAM (TEE);  Surgeon: Josue Hector, MD;  Location: [facility] ENDOSCOPY;  Service: Cardiovascular;  Laterality: N/A;  . TONSILLECTOMY      Family History  Problem Relation Age of Onset  . Heart disease Mother   . Liver disease Mother         alcohol related  . Asthma Brother   . Esophageal cancer Brother   . Asthma Son   . Colon cancer Neg Hx   . Stomach cancer Neg Hx   . Pancreatic cancer Neg Hx   . Inflammatory bowel disease Neg Hx   . Allergic rhinitis Neg Hx   . Eczema Neg Hx   . Urticaria Neg Hx     SOCIAL HX: married    Current Outpatient Medications:  .  apixaban (ELIQUIS) 5 MG TABS tablet, Take 1 tablet (5 mg total) by mouth 2 (two) times daily., Disp: 180 tablet, Rfl: 1 .  atorvastatin (LIPITOR) 10 MG tablet, Take 1 tablet (10 mg total) by mouth daily., Disp: 90 tablet, Rfl: 1 .  azelastine (ASTELIN) 0.1 % nasal spray, Place 2 sprays into both nostrils at bedtime as needed for rhinitis. Use in each nostril as directed, Disp: 90 mL, Rfl: 3 .  Calcium Carbonate-Vitamin D 600-400 MG-UNIT per tablet, Take 1 tablet by mouth daily. , Disp: , Rfl:  .  doxycycline (VIBRA-TABS) 100 MG tablet, Take 1 tablet (100 mg total) by mouth 2 (two) times daily. With food, Disp: 14 tablet, Rfl: 0 .  ferrous sulfate 325 (65 FE) MG tablet, Take 1 tablet (325 mg total) by mouth 2 (two) times daily with a meal. With lunch and dinner, Disp: 180 tablet, Rfl: 3 .  FLUoxetine (PROZAC) 40 MG capsule, Take 1 capsule (40 mg total) by mouth daily., Disp: 90 capsule, Rfl: 1 .  Fluticasone-Umeclidin-Vilant (TRELEGY ELLIPTA) 100-62.5-25 MCG/INH AEPB, Inhale 1 puff into the lungs daily., Disp: 60 each, Rfl: 11 .  furosemide (LASIX) 80 MG tablet, Take 1 tablet in the morning and 1 tablet in the afternoon, Disp: 180 tablet, Rfl: 1 .  glucosamine-chondroitin 500-400 MG tablet, Take 1 tablet by mouth every morning. , Disp: , Rfl:  .  KLOR-CON M20 20 MEQ tablet, Take 1 tablet (20 mEq total) by mouth 2 (two) times daily., Disp: 180 tablet, Rfl: 1 .  levalbuterol (XOPENEX) 1.25 MG/3ML nebulizer solution, Take 1.25 mg by nebulization every 4 (four) hours. (Patient taking differently: Take 1.25 mg by nebulization every 4 (four) hours as needed for wheezing or  shortness of breath. ), Disp: 360 mL, Rfl: 11 .  levocetirizine (XYZAL) 5 MG tablet, Take 1 tablet (5 mg total) by mouth daily as needed for allergies., Disp: 90 tablet, Rfl: 3 .  levothyroxine (SYNTHROID, LEVOTHROID) 50 MCG tablet, Take 1 tablet (50 mcg total) by mouth daily before breakfast., Disp: 90 tablet, Rfl: 3 .  metoprolol tartrate (LOPRESSOR) 25 MG tablet, Take 1 tablet (25 mg total) by mouth 2 (two) times daily., Disp: 180 tablet, Rfl: 3 .  montelukast (SINGULAIR) 10 MG tablet, Take 1 tablet (10 mg total) by mouth at bedtime., Disp: 30 tablet, Rfl: 0 .  olopatadine (PATANOL) 0.1 % ophthalmic solution, Place 1 drop into both eyes 2 (two) times  daily., Disp: 15 mL, Rfl: 4 .  OXYGEN, Inhale 2.5 L/min into the lungs continuous. And 4 with exertion, Disp: , Rfl:  .  pantoprazole (PROTONIX) 40 MG tablet, Take 1 tablet (40 mg total) by mouth daily. Take 30-60 min before first meal of the day, Disp: 90 tablet, Rfl: 3 .  propafenone (RYTHMOL) 150 MG tablet, Take 1 tablet (150 mg total) by mouth every 8 (eight) hours., Disp: 270 tablet, Rfl: 3 .  sodium chloride (OCEAN) 0.65 % SOLN nasal spray, Place 2 sprays every 4 (four) hours as needed into both nostrils for congestion., Disp: , Rfl:   EXAM: telephone  VITALS per patient if applicable:  GENERAL: alert, oriented, appears well and in no acute distress  PSYCH/NEURO: pleasant and cooperative, no obvious depression or anxiety, speech and thought processing grossly intact  ASSESSMENT AND PLAN:  Discussed the following assessment and plan:  Chronic obstructive pulmonary disease, unspecified COPD type (HCC)-appt with Dr. Alva Garnet 04/20/2018 continue inhalers I.e trelegy, xopenex pulmonary to make further recs  Dizziness Chronic maxillary sinusitis Hearing loss of right ear, unspecified hearing loss type -consider ENT in [place] in future for vestibular rehab as well  Complete doxycycline 7 day course   Allergic rhinitis, unspecified  seasonality, unspecified trigger-on Singulair qhs again      I discussed the assessment and treatment plan with the patient. The patient was provided an opportunity to ask questions and all were answered. The patient agreed with the plan and demonstrated an understanding of the instructions.   The patient was advised to call back or seek an in-person evaluation if the symptoms worsen or if the condition fails to improve as anticipated.  I provided 10 minutes of non-face-to-face time during this encounter.   Nino Glow McLean-Scocuzza, MD

## 2018-04-18 NOTE — Telephone Encounter (Signed)
Of note cough is chronic likely related to COPD possibly not being controlled I think she needs to speak with pulmonary about this as well   Have pt call Dr. Alva Garnet pulmonary to schedule a visit with him as well please even if video for now  [place]

## 2018-04-20 ENCOUNTER — Ambulatory Visit (INDEPENDENT_AMBULATORY_CARE_PROVIDER_SITE_OTHER): Payer: Medicare Other | Admitting: Pulmonary Disease

## 2018-04-20 ENCOUNTER — Encounter: Payer: Self-pay | Admitting: Pulmonary Disease

## 2018-04-20 DIAGNOSIS — J9612 Chronic respiratory failure with hypercapnia: Secondary | ICD-10-CM | POA: Diagnosis not present

## 2018-04-20 DIAGNOSIS — J9611 Chronic respiratory failure with hypoxia: Secondary | ICD-10-CM

## 2018-04-20 DIAGNOSIS — R05 Cough: Secondary | ICD-10-CM

## 2018-04-20 DIAGNOSIS — R059 Cough, unspecified: Secondary | ICD-10-CM

## 2018-04-20 MED ORDER — BENZONATATE 200 MG PO CAPS: 200.0000 mg | ORAL_CAPSULE | Freq: Three times a day (TID) | ORAL | 5 refills | Status: DC | PRN

## 2018-04-20 NOTE — Patient Instructions (Signed)
Continue current medical regimen as reviewed on intake except with following changes Continue Mucinex DM as needed up to every 4 hours Double Xyzal to 5 mg daily New prescription: [name] [name] Keep follow-up as scheduled June 15

## 2018-04-20 NOTE — Progress Notes (Signed)
This was a telephone visit due to COVID-19 pandemic. Last visit was my first encounter with her.   My initial IMPRESSION/PLAN was as follows:  IMPRESSION:     ICD-10-CM   1. Chronic respiratory failure with hypoxia and hypercapnia (HCC) J96.11    J96.12   2. Former smoker Z87.891   3. Moderate COPD (chronic obstructive pulmonary disease) (HCC) J44.9   4. History of pulmonary embolism 2009 Z86.711   5. Aortic stenosis, graded as moderate 2018 I35.0   6. Morbid obesity ([place]) E66.01   7. Chronic heart failure with preserved ejection fraction (HCC) I50.32   8. Dyspnea on exertion, multifactorial R06.09   9. Allergic rhinitis, unspecified seasonality, unspecified trigger J30.9 levocetirizine (XYZAL) 5 MG tablet  10. DNR (do not resuscitate) Z66      PLAN:  Continue Trelegy inhaler, 1 inhalation daily.  Rinse mouth after use.  We will help her obtain assistance to afford this medication  Continue Xopenex inhaler or nebulizer as needed for increased shortness of breath, wheezing, chest tightness, cough  Continue oxygen therapy as close to 24 hours/day as possible  She is to discuss possible repeat echocardiogram with Dr. Stanford Breed to reassess aortic valve and pulmonary artery pressures  Follow-up in 3 months.  Call sooner if needed   This was an "acute evaluation" She was informed of the limitations of evaluation over the phone and options were discussed. She agreed to proceed. I confirmed that I was speaking with [name] [name]. She stated "I have been better".  Specifically she reports vigorous intermittent cough which is largely nonproductive. She has been using Mucinex DM 2-3 times per day with short lived improvement. Cough has been present since Nov but is now worse due to pollens. It originates as a throat tickle and is worse after eating something that irritates throat. She denies fever and reports no change in SOB. She has been prescribed doxycycline by Dr  Aundra Dubin. She is using Xyzal @ 2.5 mg daily and Astelin nasal spray  She is not breathless over the phone. Rest of exam could not be performed  IMPRESSION: Chronic respiratory failure with hypoxia and hypercapnia (HCC)  Cough, acute and nonproductive.  Exacerbated by exposures to pollens   PLAN/REC: Continue current medical regimen as reviewed on intake except with following changes: Continue Mucinex DM as needed up to every 4 hours Double Xyzal to 5 mg daily New prescription: Ladona Ridgel Keep follow-up as scheduled June 15  Merton Border, MD PCCM service Mobile [phone] Pager [phone] 04/30/2018 8:35 PM

## 2018-04-28 IMAGING — CT CT HEAD W/O CM
4 series · 17 of 47 positions shown, 19 images · non-contrast
Comparison: CT head 04/08/2014

CLINICAL DATA: Subacute neuro deficits. Blurred vision and headache

EXAM:
CT HEAD WITHOUT CONTRAST
TECHNIQUE: Contiguous axial images were obtained from the base of the skull
through the vertex without intravenous contrast.

[Series 3: head wo · axial · 0.41mm/px · z∈[-148,-28]mm · 7 of 32 slices shown, 9 images]
[im 4/32  brain]
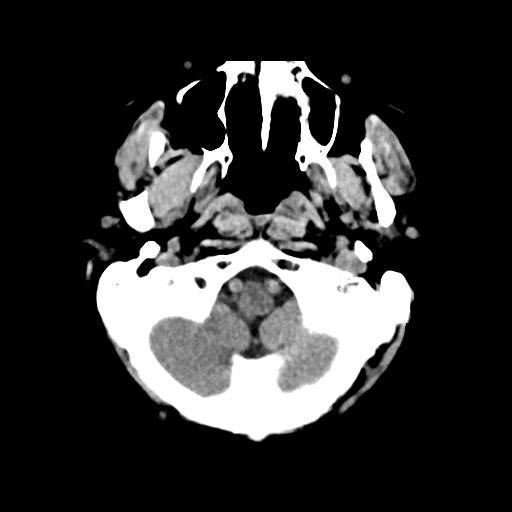
[im 4/32  bone]
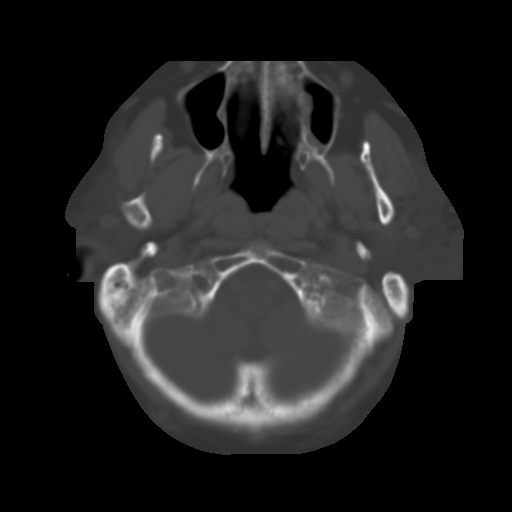
[im 8/32  brain]
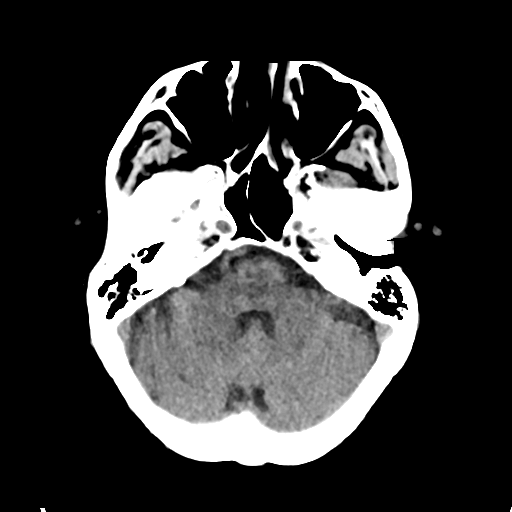
[im 12/32  brain]
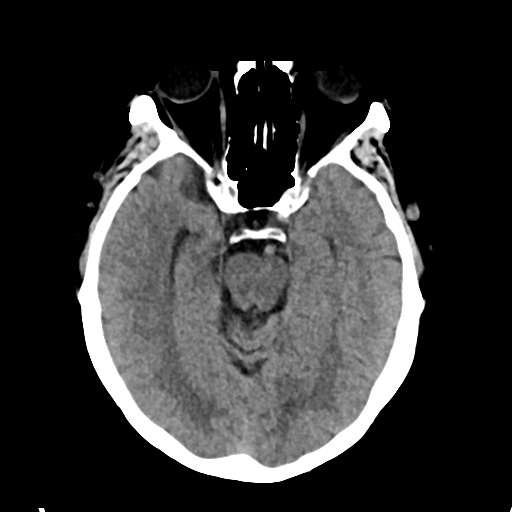
[im 16/32  brain]
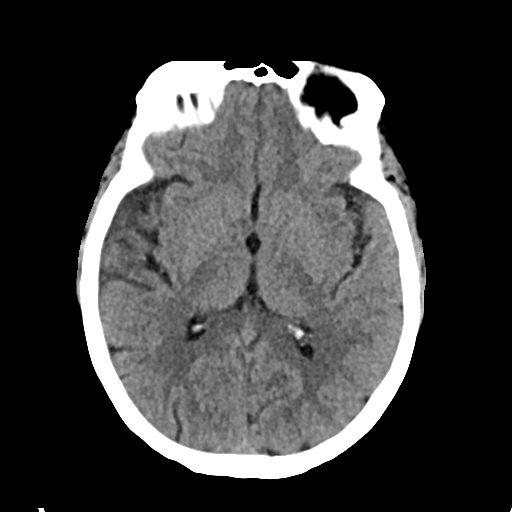
[im 20/32  brain]
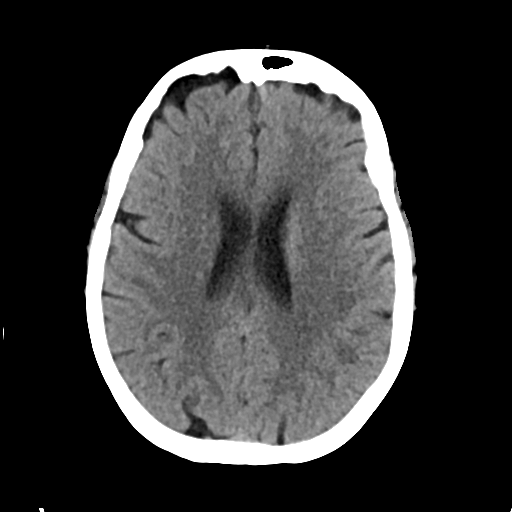
[im 20/32  bone]
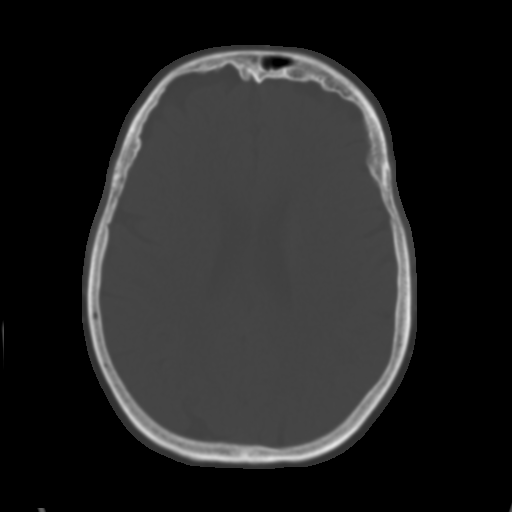
[im 24/32  brain]
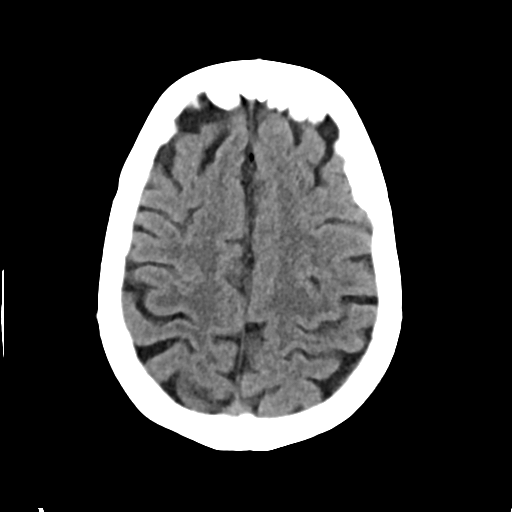
[im 28/32  brain]
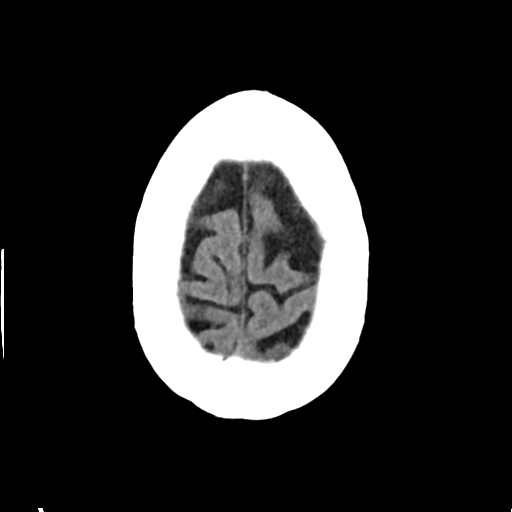

[Series 4: head bone · axial · 0.41mm/px · z∈[-148,-92]mm · 4 of 79 slices shown]
[im 8/79  bone]
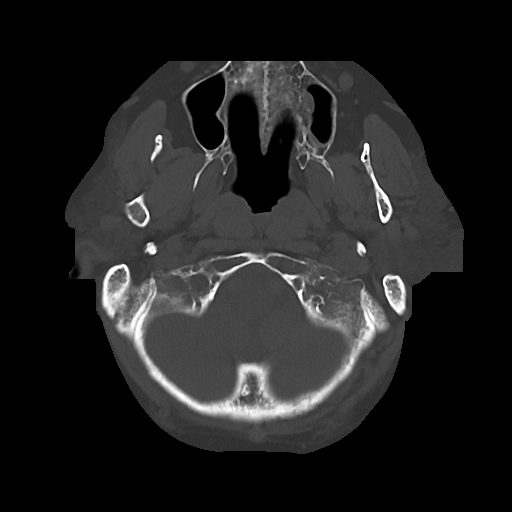
[im 16/79  bone]
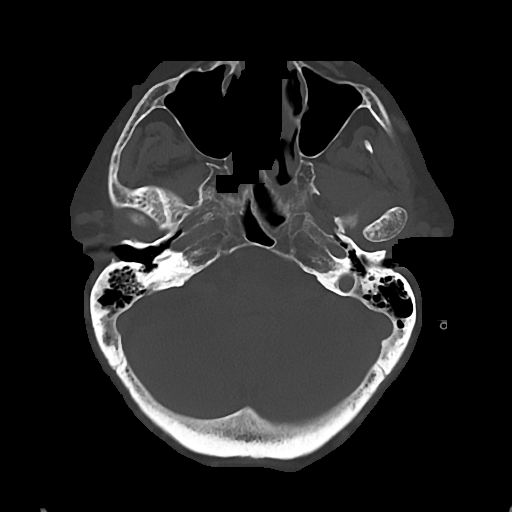
[im 24/79  bone]
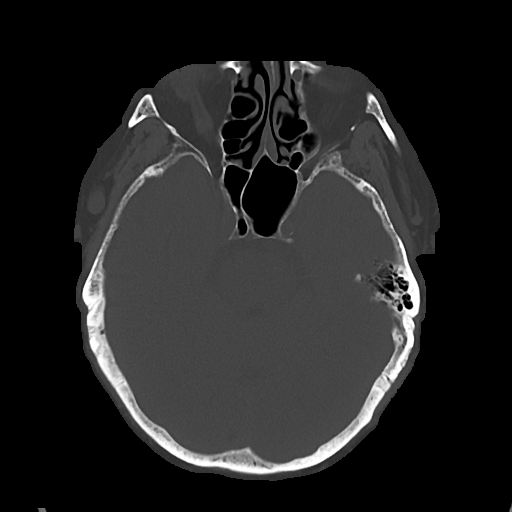
[im 36/79  bone]
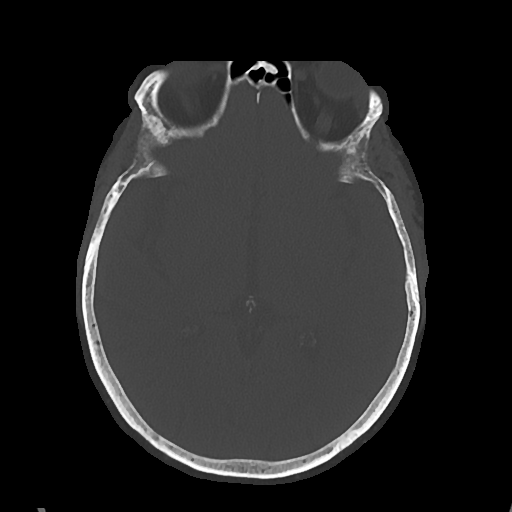

[Series 5: cor soft · coronal · 0.34mm/px · 3 of 62 slices shown]
[im 21/62  brain]
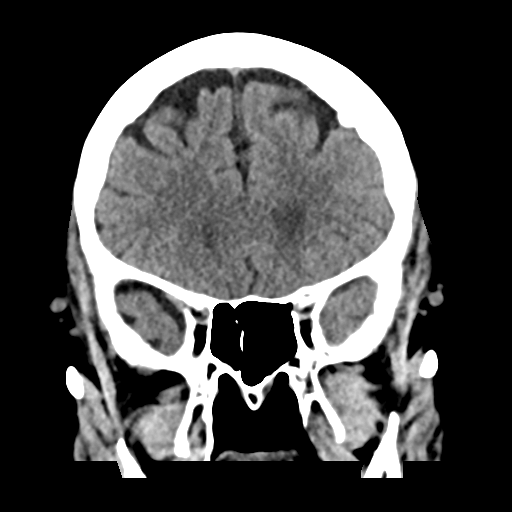
[im 28/62  brain]
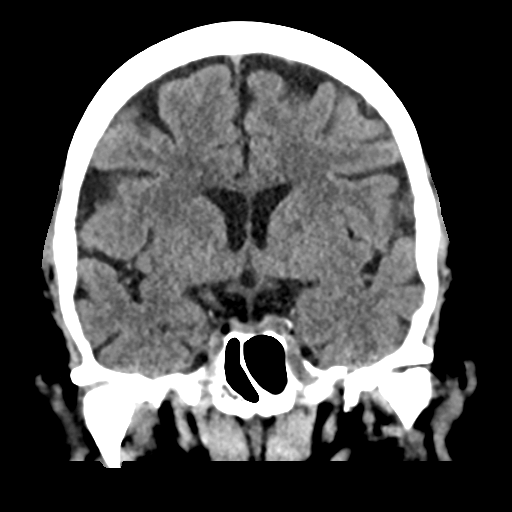
[im 34/62  brain]
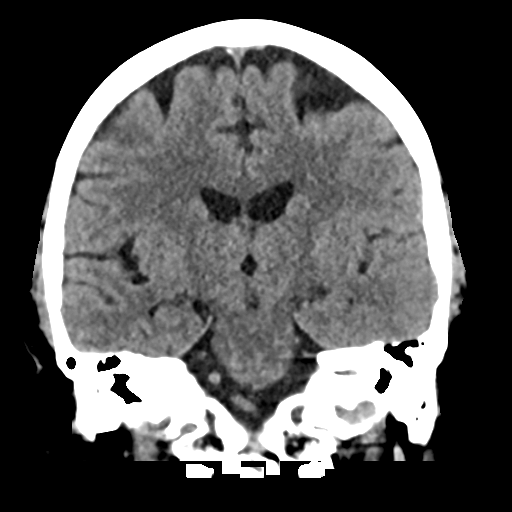

[Series 6: sag soft · sagittal · 0.34mm/px · 3 of 50 slices shown]
[im 17/50  brain]
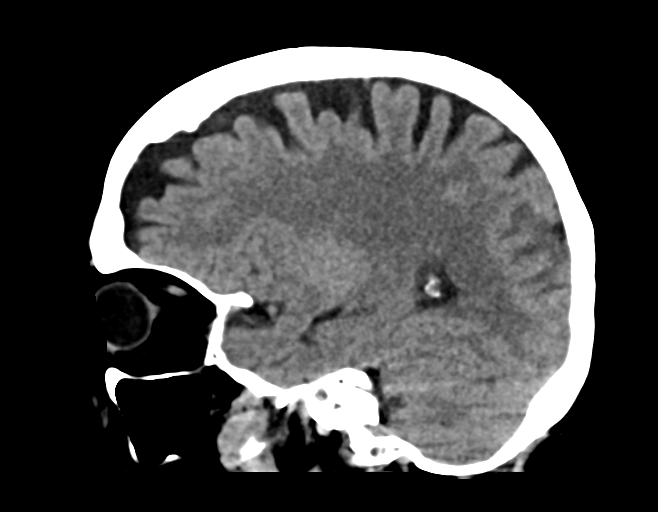
[im 25/50  brain]
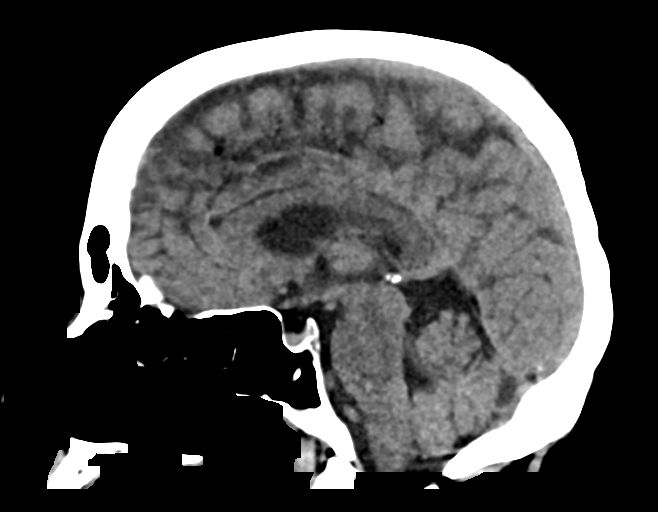
[im 33/50  brain]
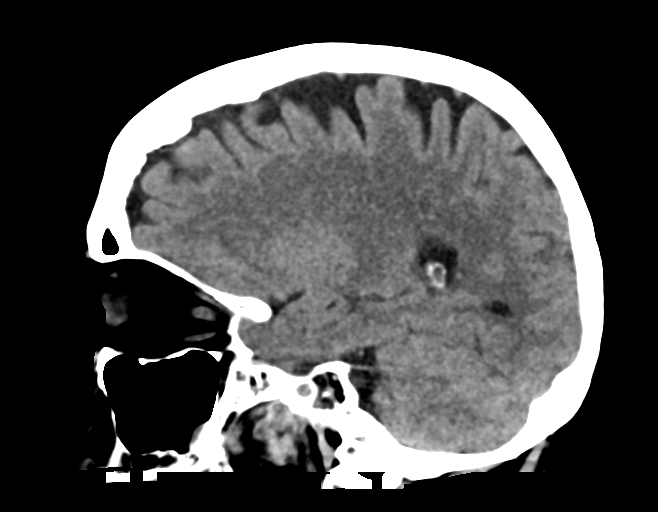

[17 of 47 positions shown; findings below may reference images not displayed]

FINDINGS: Brain: Ventricle size normal. Negative for acute infarct. Negative
for hemorrhage or mass. Mild chronic microvascular ischemic change
in the white matter unchanged from the prior study.

Vascular: Negative for hyperdense vessel

Skull: Negative

Sinuses/Orbits: Mild mucosal edema left maxillary sinus. Negative
orbit. Cataract removal on the right

Other: None
IMPRESSION: Mild chronic microvascular ischemic change in the white matter. No
acute abnormality.

## 2018-04-30 ENCOUNTER — Encounter: Payer: Self-pay | Admitting: Pulmonary Disease

## 2018-05-02 DIAGNOSIS — J449 Chronic obstructive pulmonary disease, unspecified: Secondary | ICD-10-CM | POA: Diagnosis not present

## 2018-05-03 ENCOUNTER — Telehealth: Payer: Self-pay | Admitting: Internal Medicine

## 2018-05-03 NOTE — Telephone Encounter (Signed)
Was singulair helping her?  [facility] sent a refill request  Thanks  [place]

## 2018-05-04 NOTE — Telephone Encounter (Signed)
Patient has not been taking Singulair. She will call [facility] to get it off list.

## 2018-05-11 ENCOUNTER — Telehealth: Payer: Self-pay | Admitting: Cardiology

## 2018-05-11 NOTE — Telephone Encounter (Signed)
Spoke with pt, virtual visit scheduled.

## 2018-05-11 NOTE — Telephone Encounter (Signed)
New Message   Please call patient to help setup for virtual visit.

## 2018-05-15 ENCOUNTER — Telehealth: Payer: Self-pay | Admitting: Cardiology

## 2018-05-15 NOTE — Progress Notes (Signed)
Virtual Visit via Video Note changed to phone visit due to technical difficulties.   This visit type was conducted due to national recommendations for restrictions regarding the COVID-19 Pandemic (e.g. social distancing) in an effort to limit this patient's exposure and mitigate transmission in our community.  Due to her co-morbid illnesses, this patient is at least at moderate risk for complications without adequate follow up.  This format is felt to be most appropriate for this patient at this time.  All issues noted in this document were discussed and addressed.  A limited physical exam was performed with this format.  Please refer to the patient's chart for her consent to telehealth for [facility].   Date:  04/13/8766   ID:  [name] [name], DOB [birth year], MRN [id number]  Patient Location: Home Provider Location: Home  PCP:  McLean-Scocuzza, Nino Glow, MD  Cardiologist:  Kirk Ruths, MD   Evaluation Performed:  Follow-Up Visit  Chief Complaint:  FU atrial fibrillation/flutter  History of Present Illness:    FU AFib/flutter, prior pulmonary embolism, chronic coumadin, diastolic dysfunction, morbid obesity, COPD, LE edema. Cardiac cath in 2003 was normal. Nuclear study March 2015 showed an ejection fraction 61% and normal perfusion. Seen November 2016 by Dr. Martinique and noted to be in atrial flutter. Subsequently had TEE cardioversion 11/16; TEE revealed normal LV function; mild MR; mild biatrial enlargement.Had atrial flutter ablation September 2018. Last echocardiogram October 2018 shows normal LV function, grade 2 diastolic dysfunction, moderate aortic stenosis with mean gradient 20 mmHg, severe left atrial enlargement and moderately elevated pulmonary pressure. Recurrent atrial fibrillation October 2018requiringcardioversion. Since she was last seen she has chronic dyspnea on exertion unchanged.  She has some dizziness with lying on her left side.  No chest pain, palpitations or  syncope.  No pedal edema.  The patient does not have symptoms concerning for COVID-19 infection (fever, chills, cough, or new shortness of breath).    Past Medical History:  Diagnosis Date  . Anemia    hx  . Anxiety   . Arthritis   . Asthma   . Atrial fibrillation and flutter ([place])   . Atypical lobular hyperplasia of right breast 05/09/2015  . Breast CA ([place])    ?  Marland Kitchen Chronic rhinitis   . COPD (chronic obstructive pulmonary disease) ([place])    [place]. PFTs 07/25/06 FEV1 55% ratio 53, DLC0 51% HFA 50% 12/16/2009  . Depression   . FRACTURE, RIB, RIGHT 06/18/2009  . Glucose intolerance (impaired glucose tolerance)    on steroids  . History of DVT (deep vein thrombosis)   . Hx of cardiac catheterization    LHC (01/2001):  Normal Cors.  EF 65%.;  LexiScan Myoview (03/2013): No ischemia, EF 61%, normal  . Hx of echocardiogram    Echo (11/2011):  Mod LVH, EF 60-65%, no RWMA, MAC, mild LAE, PASP 34.  Marland Kitchen HYPERLIPIDEMIA   . Hypertension   . Hypothyroidism   . Morbid obesity ([place])    target wt=179lb for BMI<30 Peak wt 282lb  . Osteoporosis    last BMD 4/08 -2.4, intolerant of bisphosphonates  . Peripheral vascular disease (HCC)    hx dvt.pe  . Pneumonia    hx  . PONV (postoperative nausea and vomiting)   . PREMATURE VENTRICULAR CONTRACTIONS   . PULMONARY EMBOLISM, HX OF 06/19/2007  . Recurrent upper respiratory infection (URI)   . Rhabdomyolysis 07/29/2009  . Tremor   . VITAMIN D DEFICIENCY    Past Surgical History:  Procedure Laterality  Date  . A-FLUTTER ABLATION N/A 10/08/2016   Procedure: A-FLUTTER ABLATION;  Surgeon: Evans Lance, MD;  Location: [facility];  Service: Cardiovascular;  Laterality: N/A;  . ABDOMINAL HYSTERECTOMY    . BREAST LUMPECTOMY WITH RADIOACTIVE SEED LOCALIZATION Right 06/05/2015   Procedure: RIGHT BREAST LUMPECTOMY WITH RADIOACTIVE SEED LOCALIZATION;  Surgeon: Fanny Skates, MD;  Location: [facility];  Service: General;  Laterality: Right;  . BREAST SURGERY  Left    s/p mass removal  . CARDIOVERSION N/A 12/11/2014   Procedure: CARDIOVERSION;  Surgeon: Josue Hector, MD;  Location: [facility];  Service: Cardiovascular;  Laterality: N/A;  . CARDIOVERSION N/A 09/15/2016   Procedure: CARDIOVERSION;  Surgeon: Jerline Pain, MD;  Location: [facility]OSCOPY;  Service: Cardiovascular;  Laterality: N/A;  . COLONOSCOPY  10/22/2003, ?2006  . COLONOSCOPY WITH PROPOFOL N/A 01/15/2016   Procedure: COLONOSCOPY WITH PROPOFOL;  Surgeon: Gatha Mayer, MD;  Location: [facility];  Service: Endoscopy;  Laterality: N/A;  . ESOPHAGOGASTRODUODENOSCOPY (EGD) WITH PROPOFOL N/A 01/15/2016   Procedure: ESOPHAGOGASTRODUODENOSCOPY (EGD) WITH PROPOFOL;  Surgeon: Gatha Mayer, MD;  Location: [facility];  Service: Endoscopy;  Laterality: N/A;  . s/p left arm fracture with fall off chair    . skin graft to middle R finger  1975  . TEE WITHOUT CARDIOVERSION N/A 12/11/2014   Procedure: TRANSESOPHAGEAL ECHOCARDIOGRAM (TEE);  Surgeon: Josue Hector, MD;  Location: [facility] ENDOSCOPY;  Service: Cardiovascular;  Laterality: N/A;  . TONSILLECTOMY       Current Meds  Medication Sig  . apixaban (ELIQUIS) 5 MG TABS tablet Take 1 tablet (5 mg total) by mouth 2 (two) times daily.  Marland Kitchen atorvastatin (LIPITOR) 10 MG tablet Take 1 tablet (10 mg total) by mouth daily.  Marland Kitchen azelastine (ASTELIN) 0.1 % nasal spray Place 2 sprays into both nostrils at bedtime as needed for rhinitis. Use in each nostril as directed  . benzonatate (TESSALON) 200 MG capsule Take 1 capsule (200 mg total) by mouth 3 (three) times daily as needed for cough.  . Calcium Carbonate-Vitamin D 600-400 MG-UNIT per tablet Take 1 tablet by mouth daily.   . ferrous sulfate 325 (65 FE) MG tablet Take 1 tablet (325 mg total) by mouth 2 (two) times daily with a meal. With lunch and dinner  . FLUoxetine (PROZAC) 40 MG capsule Take 1 capsule (40 mg total) by mouth daily.  . Fluticasone-Umeclidin-Vilant (TRELEGY ELLIPTA) 100-62.5-25 MCG/INH  AEPB Inhale 1 puff into the lungs daily.  . furosemide (LASIX) 80 MG tablet Take 1 tablet in the morning and 1 tablet in the afternoon  . glucosamine-chondroitin 500-400 MG tablet Take 1 tablet by mouth every morning.   Marland Kitchen KLOR-CON M20 20 MEQ tablet Take 1 tablet (20 mEq total) by mouth 2 (two) times daily.  Marland Kitchen levalbuterol (XOPENEX) 1.25 MG/3ML nebulizer solution Take 1.25 mg by nebulization every 4 (four) hours. (Patient taking differently: Take 1.25 mg by nebulization every 4 (four) hours as needed for wheezing or shortness of breath. )  . levocetirizine (XYZAL) 5 MG tablet Take 1 tablet (5 mg total) by mouth daily as needed for allergies.  . metoprolol tartrate (LOPRESSOR) 25 MG tablet Take 1 tablet (25 mg total) by mouth 2 (two) times daily.  Marland Kitchen olopatadine (PATANOL) 0.1 % ophthalmic solution Place 1 drop into both eyes 2 (two) times daily.  . OXYGEN Inhale 2.5 L/min into the lungs continuous. And 4 with exertion  . pantoprazole (PROTONIX) 40 MG tablet Take 1 tablet (40 mg total)  by mouth daily. Take 30-60 min before first meal of the day  . propafenone (RYTHMOL) 150 MG tablet Take 1 tablet (150 mg total) by mouth every 8 (eight) hours.     Allergies:   Duloxetine; Penicillins; and Latex   Social History   Tobacco Use  . Smoking status: Former Smoker    Packs/day: 1.00    Years: 35.00    Pack years: 35.00    Types: Cigarettes    Last attempt to quit: 01/11/2006    Years since quitting: 12.3  . Smokeless tobacco: Never Used  Substance Use Topics  . Alcohol use: No    Alcohol/week: 0.0 standard drinks  . Drug use: No     Family Hx: The patient's family history includes Asthma in her brother and son; Esophageal cancer in her brother; Heart disease in her mother; Liver disease in her mother. There is no history of Colon cancer, Stomach cancer, Pancreatic cancer, Inflammatory bowel disease, Allergic rhinitis, Eczema, or Urticaria.  ROS:   Please see the history of present illness.     No fevers, chills or productive cough. All other systems reviewed and are negative.   Recent Labs: 03/09/2018: ALT 9; BUN 14; Creatinine, Ser 0.80; Hemoglobin 11.1; Platelets 238.0; Potassium 4.0; Sodium 144; TSH 4.55   Recent Lipid Panel Lab Results  Component Value Date/Time   CHOL 146 03/09/2018 09:49 AM   TRIG 111.0 03/09/2018 09:49 AM   HDL 40.30 03/09/2018 09:49 AM   CHOLHDL 4 03/09/2018 09:49 AM   LDLCALC 83 03/09/2018 09:49 AM   LDLDIRECT 140.2 04/10/2010 08:36 AM    Wt Readings from Last 3 Encounters:  05/16/18 256 lb (116.1 kg)  03/30/18 252 lb (114.3 kg)  03/30/18 252 lb (114.3 kg)     Objective:    Vital Signs:  BP 139/69   Pulse 77   Ht _0  (1.575 m)   Wt 256 lb (116.1 kg)   SpO2 97%   BMI 46.82 kg/m    VITAL SIGNS:  reviewed  Answers questions appropriately Normal affect Remainder of physical examination not performed (telehealth visit; coronavirus pandemic)  ASSESSMENT & PLAN:    1. Paroxysmal atrial fibrillation/flutter-based on history she has not had recurrences.  Plan to continue propafenone and apixaban. Continue metoprolol. 2. History of moderate aortic stenosis-at previous office visit we discussed progression of aortic stenosis and potential need for aortic valve replacement in the future.  She was clear that given her no CODE BLUE status and overall medical condition she would not consider aortic valve replacement and therefore we have elected not to pursue follow-up echocardiograms.  Patient remains in agreement with this approach. 3. Chronic diastolic congestive heart failure-she apparently is doing well from a symptomatic standpoint.  We will continue with present dose of diuretic.  Continue sodium restriction and fluid restriction. 4. Hypertension-patient's blood pressure is controlled.  Continue present medications and follow. 5. Hyperlipidemia-continue statin. 6. Obesity-we discussed the importance of weight loss today.  COVID-19  Education: The importance of social distancing was discussed today.  Time:   Today, I have spent 15 minutes with the patient with telehealth technology discussing the above problems.     Medication Adjustments/Labs and Tests Ordered: Current medicines are reviewed at length with the patient today.  Concerns regarding medicines are outlined above.   Tests Ordered: No orders of the defined types were placed in this encounter.   Medication Changes: No orders of the defined types were placed in this encounter.   Disposition:  Follow up in 6 month(s)  Signed, Kirk Ruths, MD  05/16/2018 10:21 AM    [place]

## 2018-05-16 ENCOUNTER — Telehealth (INDEPENDENT_AMBULATORY_CARE_PROVIDER_SITE_OTHER): Payer: Medicare Other | Admitting: Cardiology

## 2018-05-16 ENCOUNTER — Encounter: Payer: Self-pay | Admitting: Cardiology

## 2018-05-16 VITALS — BP 139/69 | HR 77 | Ht 62.0 in | Wt 256.0 lb

## 2018-05-16 DIAGNOSIS — I350 Nonrheumatic aortic (valve) stenosis: Secondary | ICD-10-CM

## 2018-05-16 DIAGNOSIS — I10 Essential (primary) hypertension: Secondary | ICD-10-CM

## 2018-05-16 DIAGNOSIS — I480 Paroxysmal atrial fibrillation: Secondary | ICD-10-CM

## 2018-05-16 DIAGNOSIS — I5032 Chronic diastolic (congestive) heart failure: Secondary | ICD-10-CM

## 2018-05-16 NOTE — Patient Instructions (Signed)

## 2018-05-17 ENCOUNTER — Telehealth: Payer: Self-pay | Admitting: Cardiology

## 2018-05-17 NOTE — Telephone Encounter (Signed)
° °  Pt c/o medication issue:  1. Name of Medication: Eliquis  2. How are you currently taking this medication (dosage and times per day)? As written  3. Are you having a reaction (difficulty breathing--STAT)? no  4. What is your medication issue? medication too costly, would like to change to another drug

## 2018-05-17 NOTE — Telephone Encounter (Signed)
Per pt is needing to change Eliquis to cheaper med Pt has been on Coumadin in the past and is willing to go back as 90 day supply is $347.00 of the Eliquis Pt currently has about 2 months of Eliquis and will finish this and will start new med at that time Will forward to [facility] to address when isrequired .

## 2018-05-17 NOTE — Telephone Encounter (Signed)
Spoke with patient.  She is also on another brand drug (inhaler) and will hit the donut hole soon.  Also her husband is retiring, so they need to cut back on expenses.  She was on warfarin in the past.    She will call when she has 2 wks supply of Eliquis and we will start conversion back to warfarin at that time.  Patient voiced understanding.

## 2018-05-29 ENCOUNTER — Ambulatory Visit: Payer: Medicare Other

## 2018-06-01 DIAGNOSIS — J449 Chronic obstructive pulmonary disease, unspecified: Secondary | ICD-10-CM | POA: Diagnosis not present

## 2018-06-12 ENCOUNTER — Encounter: Payer: Self-pay | Admitting: Cardiology

## 2018-06-19 ENCOUNTER — Telehealth: Payer: Self-pay | Admitting: Pulmonary Disease

## 2018-06-19 NOTE — Telephone Encounter (Signed)
Called patient, it looks like Dr. Stanford Breed manages her Elliquis. I will send this to their office for refill.

## 2018-06-19 NOTE — Telephone Encounter (Signed)
Refill apixaban [name] [name]

## 2018-06-20 MED ORDER — APIXABAN 5 MG PO TABS: 5.0000 mg | ORAL_TABLET | Freq: Two times a day (BID) | ORAL | 1 refills | Status: DC

## 2018-06-20 NOTE — Telephone Encounter (Signed)
Refill sent to the pharmacy electronically.  

## 2018-06-26 ENCOUNTER — Encounter: Payer: Self-pay | Admitting: Pulmonary Disease

## 2018-06-26 ENCOUNTER — Ambulatory Visit (INDEPENDENT_AMBULATORY_CARE_PROVIDER_SITE_OTHER): Payer: Medicare Other | Admitting: Pulmonary Disease

## 2018-06-26 DIAGNOSIS — R0609 Other forms of dyspnea: Secondary | ICD-10-CM

## 2018-06-26 DIAGNOSIS — Z86711 Personal history of pulmonary embolism: Secondary | ICD-10-CM

## 2018-06-26 DIAGNOSIS — Z87891 Personal history of nicotine dependence: Secondary | ICD-10-CM | POA: Diagnosis not present

## 2018-06-26 DIAGNOSIS — J9611 Chronic respiratory failure with hypoxia: Secondary | ICD-10-CM | POA: Diagnosis not present

## 2018-06-26 DIAGNOSIS — I5032 Chronic diastolic (congestive) heart failure: Secondary | ICD-10-CM

## 2018-06-26 DIAGNOSIS — R0600 Dyspnea, unspecified: Secondary | ICD-10-CM

## 2018-06-26 DIAGNOSIS — J449 Chronic obstructive pulmonary disease, unspecified: Secondary | ICD-10-CM

## 2018-06-26 DIAGNOSIS — J9612 Chronic respiratory failure with hypercapnia: Secondary | ICD-10-CM

## 2018-06-26 MED ORDER — TRELEGY ELLIPTA 100-62.5-25 MCG/INH IN AEPB: 1.0000 | INHALATION_SPRAY | Freq: Every day | RESPIRATORY_TRACT | 0 refills | Status: DC

## 2018-06-26 MED ORDER — TRELEGY ELLIPTA 100-62.5-25 MCG/INH IN AEPB: 1.0000 | INHALATION_SPRAY | Freq: Every day | RESPIRATORY_TRACT | 3 refills | Status: DC

## 2018-06-26 NOTE — Patient Instructions (Addendum)
Continue Trelegy inhaler, 1 inhalation daily.  Prescription entered for 1 inhaler to be obtained at [facility] drug  Then refills for 3 months at a time through [facility]  Continue Xopenex nebulizer or rescue inhaler as needed Continue oxygen therapy is close to 24 hours/day as possible Follow-up in 6 months.  Call sooner if needed

## 2018-06-26 NOTE — Progress Notes (Signed)
PULMONARY OFFICE FOLLOW UP NOTE  Requesting MD/Service: Self Date of initial consultation: 03/17/18 Reason for consultation: Chronic hypoxemic and hypercarbic respiratory failure  PT PROFILE: 68 y.o. female former smoker previously followed by Dr Melvyn Novas with chronic hypoxemic/hypercarbic respiratory failure due to COPD, morbid obesity, OHS, history of pulmonary embolism, moderate PAH, aortic stenosis, HFpEF  PROBLEMS: Chronic hypoxemic/hypercarbic respiratory failure Moderate COPD Former smoker History of pulmonary embolism Severe aortic stenosis Morbid obesity DNR  Virtual Visit via Telephone Note  I connected with [name] [name] on [birth year] at 10:00 AM EDT by telephone and verified that I am speaking with the correct person using two identifiers. I discussed the limitations, risks, security and privacy concerns of performing an evaluation and management service by telephone and the availability of in person appointments. I also discussed with the patient that there may be a patient responsible charge related to this service. The patient expressed understanding and agreed to proceed.  SUBJ: Last visit was a telephone encounter on 04/20/2018.  At that time, she reported intermittent vigorous cough.  She was using Mucinex DM with some improvement that was short-lived.  Ended that she double Xyzal to 5 mg daily and placed a prescription for [employer].  Her cough is much improved.  She remains on Trelegy inhaler.  She states that she is doing very well with overall improvement in exertional dyspnea.  She has no new complaints.  She denies CP, fever, purulent sputum, hemoptysis, LE edema and calf tenderness.   OBJ: There were no vitals filed for this visit.  Due to the remote nature of this encounter, physical examination cannot be performed.   BMP Latest Ref Rng & Units 03/09/2018 10/27/2017 10/07/2017  Glucose 70 - 99 mg/dL 131(H) 131(H) 130(H)  BUN 6 - 23 mg/dL 14 15 12   Creatinine  0.40 - 1.20 mg/dL 0.80 0.77 0.76  Sodium 135 - 145 mEq/L 144 144 146(H)  Potassium 3.5 - 5.1 mEq/L 4.0 4.0 3.7  Chloride 96 - 112 mEq/L 102 103 103  CO2 19 - 32 mEq/L 35(H) 33(H) 31  Calcium 8.4 - 10.5 mg/dL 8.7 9.4 9.2   CBC Latest Ref Rng & Units 03/09/2018 10/27/2017 10/07/2017  WBC 4.0 - 10.5 K/uL 6.1 6.4 6.0  Hemoglobin 12.0 - 15.0 g/dL 11.1(L) 11.8(L) 11.6(L)  Hematocrit 36.0 - 46.0 % 32.0(L) 34.6(L) 35.0(L)  Platelets 150.0 - 400.0 K/uL 238.0 213.0 228   Current Outpatient Medications  Medication Instructions  . apixaban (ELIQUIS) 5 mg, Oral, 2 times daily  . atorvastatin (LIPITOR) 10 mg, Oral, Daily  . azelastine (ASTELIN) 0.1 % nasal spray 2 sprays, Each Nare, At bedtime PRN, Use in each nostril as directed   . benzonatate (TESSALON) 200 mg, Oral, 3 times daily PRN  . Calcium Carbonate-Vitamin D 600-400 MG-UNIT per tablet 1 tablet, Oral, Daily  . ferrous sulfate 325 mg, Oral, 2 times daily with meals, With lunch and dinner  . FLUoxetine (PROZAC) 40 mg, Oral, Daily  . Fluticasone-Umeclidin-Vilant (TRELEGY ELLIPTA) 100-62.5-25 MCG/INH AEPB 1 puff, Inhalation, Daily  . Fluticasone-Umeclidin-Vilant (TRELEGY ELLIPTA) 100-62.5-25 MCG/INH AEPB 1 puff, Inhalation, Daily  . furosemide (LASIX) 80 MG tablet Take 1 tablet in the morning and 1 tablet in the afternoon  . glucosamine-chondroitin 500-400 MG tablet 1 tablet,  Every morning - 10a  . KLOR-CON M20 20 MEQ tablet 20 mEq, Oral, 2 times daily  . levalbuterol (XOPENEX) 1.25 mg, Nebulization, Every 4 hours  . levocetirizine (XYZAL) 5 mg, Oral, Daily PRN  . metoprolol tartrate (LOPRESSOR)  25 mg, Oral, 2 times daily  . olopatadine (PATANOL) 0.1 % ophthalmic solution 1 drop, Both Eyes, 2 times daily  . OXYGEN 2.5 L/min, Inhalation, Continuous, And 4 with exertion  . pantoprazole (PROTONIX) 40 mg, Oral, Daily, Take 30-60 min before first meal of the day  . propafenone (RYTHMOL) 150 mg, Oral, Every 8 hours  . sodium chloride (OCEAN) 0.65 %  SOLN nasal spray 2 sprays, Each Nare, Every 4 hours PRN    CXR: No recent film  IMPRESSION: Chronic respiratory failure with hypoxia and hypercapnia (HCC)  Moderate COPD (chronic obstructive pulmonary disease) (HCC)  History of pulmonary embolism  Former smoker Morbid obesity ([place])  Chronic heart failure with preserved ejection fraction (HCC) Multifactorial exertional dyspnea with major factors listed above  PLAN/REC: Continue Trelegy inhaler, 1 inhalation daily.  Rinse mouth after use  Prescription entered for 1 inhaler to be obtained at [facility] drug  Then refills for 3 months at a time through [facility]  Continue Xopenex nebulizer or rescue inhaler as needed Continue oxygen therapy as close to 24 hours/day as possible I encouraged her to continue efforts at weight loss Follow-up in 6 months.  Call sooner if needed   Merton Border, MD PCCM service Mobile [phone] Pager [phone] 06/26/2018 10:12 AM

## 2018-07-02 DIAGNOSIS — J449 Chronic obstructive pulmonary disease, unspecified: Secondary | ICD-10-CM | POA: Diagnosis not present

## 2018-07-04 ENCOUNTER — Telehealth (HOSPITAL_COMMUNITY): Payer: Self-pay | Admitting: Radiology

## 2018-07-04 NOTE — Telephone Encounter (Signed)

## 2018-07-05 ENCOUNTER — Other Ambulatory Visit (HOSPITAL_COMMUNITY): Payer: Medicare Other

## 2018-07-07 ENCOUNTER — Ambulatory Visit: Payer: Medicare Other

## 2018-07-07 DIAGNOSIS — Z1231 Encounter for screening mammogram for malignant neoplasm of breast: Secondary | ICD-10-CM | POA: Diagnosis not present

## 2018-07-07 LAB — HM MAMMOGRAPHY

## 2018-07-10 NOTE — Telephone Encounter (Signed)
Opened in error

## 2018-07-17 ENCOUNTER — Other Ambulatory Visit: Payer: Self-pay

## 2018-07-17 ENCOUNTER — Ambulatory Visit (HOSPITAL_COMMUNITY): Payer: Medicare Other | Attending: Internal Medicine

## 2018-07-17 DIAGNOSIS — I272 Pulmonary hypertension, unspecified: Secondary | ICD-10-CM

## 2018-08-01 DIAGNOSIS — J449 Chronic obstructive pulmonary disease, unspecified: Secondary | ICD-10-CM | POA: Diagnosis not present

## 2018-08-15 ENCOUNTER — Other Ambulatory Visit: Payer: Self-pay | Admitting: Internal Medicine

## 2018-08-15 ENCOUNTER — Other Ambulatory Visit: Payer: Self-pay

## 2018-08-15 DIAGNOSIS — E785 Hyperlipidemia, unspecified: Secondary | ICD-10-CM

## 2018-08-15 MED ORDER — ATORVASTATIN CALCIUM 10 MG PO TABS: 10.0000 mg | ORAL_TABLET | Freq: Every day | ORAL | 3 refills | Status: DC

## 2018-08-18 ENCOUNTER — Other Ambulatory Visit: Payer: Self-pay

## 2018-08-18 DIAGNOSIS — E785 Hyperlipidemia, unspecified: Secondary | ICD-10-CM

## 2018-08-18 MED ORDER — ATORVASTATIN CALCIUM 10 MG PO TABS: 10.0000 mg | ORAL_TABLET | Freq: Every day | ORAL | 3 refills | Status: DC

## 2018-08-28 ENCOUNTER — Other Ambulatory Visit: Payer: Self-pay | Admitting: Cardiology

## 2018-08-31 ENCOUNTER — Other Ambulatory Visit: Payer: Self-pay | Admitting: Internal Medicine

## 2018-08-31 DIAGNOSIS — E785 Hyperlipidemia, unspecified: Secondary | ICD-10-CM

## 2018-08-31 MED ORDER — ATORVASTATIN CALCIUM 10 MG PO TABS: 10.0000 mg | ORAL_TABLET | Freq: Every day | ORAL | 3 refills | Status: AC

## 2018-08-31 NOTE — Telephone Encounter (Signed)
Copied from [place]. Topic: Quick Communication - Rx Refill/Question >> Aug 31, 2018  1:30 PM Erick Blinks wrote: Medication: atorvastatin (LIPITOR) 10 MG tablet   Has the patient contacted their pharmacy? Yes  (Agent: If no, request that the patient contact the pharmacy for the refill.) (Agent: If yes, when and what did the pharmacy advise?)  Preferred Pharmacy (with phone number or street name): [place] [place] [facility] [address] [place] [address] [place] [address] Phone: [phone] Fax: [phone]    Agent: Please be advised that RX refills may take up to 3 business days. We ask that you follow-up with your pharmacy.

## 2018-09-01 DIAGNOSIS — J449 Chronic obstructive pulmonary disease, unspecified: Secondary | ICD-10-CM | POA: Diagnosis not present

## 2018-09-04 ENCOUNTER — Telehealth: Payer: Self-pay | Admitting: Pulmonary Disease

## 2018-09-04 MED ORDER — TRELEGY ELLIPTA 100-62.5-25 MCG/INH IN AEPB: 1.0000 | INHALATION_SPRAY | Freq: Every day | RESPIRATORY_TRACT | 3 refills | Status: DC

## 2018-09-04 NOTE — Telephone Encounter (Signed)
Called and spoke to pt, who is request refill on Trelegy.  Rx has been sent to preferred pharmacy. Nothing further is needed.

## 2018-09-08 ENCOUNTER — Ambulatory Visit (INDEPENDENT_AMBULATORY_CARE_PROVIDER_SITE_OTHER): Payer: Medicare Other

## 2018-09-08 ENCOUNTER — Other Ambulatory Visit: Payer: Self-pay | Admitting: Cardiology

## 2018-09-08 ENCOUNTER — Other Ambulatory Visit: Payer: Self-pay

## 2018-09-08 DIAGNOSIS — Z23 Encounter for immunization: Secondary | ICD-10-CM

## 2018-10-02 DIAGNOSIS — J449 Chronic obstructive pulmonary disease, unspecified: Secondary | ICD-10-CM | POA: Diagnosis not present

## 2018-10-03 ENCOUNTER — Ambulatory Visit (INDEPENDENT_AMBULATORY_CARE_PROVIDER_SITE_OTHER): Payer: Medicare Other | Admitting: Internal Medicine

## 2018-10-03 ENCOUNTER — Telehealth: Payer: Self-pay | Admitting: *Deleted

## 2018-10-03 ENCOUNTER — Other Ambulatory Visit: Payer: Self-pay

## 2018-10-03 ENCOUNTER — Encounter: Payer: Self-pay | Admitting: Internal Medicine

## 2018-10-03 VITALS — BP 124/60 | HR 60 | Ht 64.0 in | Wt 255.0 lb

## 2018-10-03 DIAGNOSIS — M8580 Other specified disorders of bone density and structure, unspecified site: Secondary | ICD-10-CM | POA: Diagnosis not present

## 2018-10-03 DIAGNOSIS — N816 Rectocele: Secondary | ICD-10-CM | POA: Insufficient documentation

## 2018-10-03 DIAGNOSIS — J9612 Chronic respiratory failure with hypercapnia: Secondary | ICD-10-CM

## 2018-10-03 DIAGNOSIS — E2839 Other primary ovarian failure: Secondary | ICD-10-CM | POA: Diagnosis not present

## 2018-10-03 DIAGNOSIS — I10 Essential (primary) hypertension: Secondary | ICD-10-CM | POA: Diagnosis not present

## 2018-10-03 DIAGNOSIS — Z6841 Body Mass Index (BMI) 40.0 and over, adult: Secondary | ICD-10-CM | POA: Insufficient documentation

## 2018-10-03 DIAGNOSIS — J449 Chronic obstructive pulmonary disease, unspecified: Secondary | ICD-10-CM | POA: Diagnosis not present

## 2018-10-03 DIAGNOSIS — J9611 Chronic respiratory failure with hypoxia: Secondary | ICD-10-CM | POA: Diagnosis not present

## 2018-10-03 NOTE — Progress Notes (Signed)
Telephone Note  I connected with Brennan Bailey  on 40/98/11 at 11:00 AM EDT telephone and verified that I am speaking with the correct person using two identifiers.  Location patient: home Location provider:work or home office Persons participating in the virtual visit: patient, provider  I discussed the limitations of evaluation and management by telemedicine and the availability of in person appointments. The patient expressed understanding and agreed to proceed.   HPI: 1. Copd stable and improved on trelegy not having to do rescue inhalres O2 at 9:45 today 95% on 2.5 L at times turns up to 3 L with exertion  -she needs new pulm md and will let me know in future about referral  2. Dizziness and ear pressure resolved  3. Wants Tdap and shingrix vaccine  4. HTN controlled on meds    ROS: See pertinent positives and negatives per HPI.  Past Medical History:  Diagnosis Date  . Anemia    hx  . Anxiety   . Arthritis   . Asthma   . Atrial fibrillation and flutter ([place])   . Atypical lobular hyperplasia of right breast 05/09/2015  . Breast CA ([place])    ?  Marland Kitchen Chronic rhinitis   . COPD (chronic obstructive pulmonary disease) ([place])    [place]. PFTs 07/25/06 FEV1 55% ratio 53, DLC0 51% HFA 50% 12/16/2009  . Depression   . FRACTURE, RIB, RIGHT 06/18/2009  . Glucose intolerance (impaired glucose tolerance)    on steroids  . History of DVT (deep vein thrombosis)   . Hx of cardiac catheterization    LHC (01/2001):  Normal Cors.  EF 65%.;  LexiScan Myoview (03/2013): No ischemia, EF 61%, normal  . Hx of echocardiogram    Echo (11/2011):  Mod LVH, EF 60-65%, no RWMA, MAC, mild LAE, PASP 34.  Marland Kitchen HYPERLIPIDEMIA   . Hypertension   . Hypothyroidism   . Morbid obesity ([place])    target wt=179lb for BMI<30 Peak wt 282lb  . Osteoporosis    last BMD 4/08 -2.4, intolerant of bisphosphonates  . Peripheral vascular disease (HCC)    hx dvt.pe  . Pneumonia    hx  . PONV (postoperative nausea and vomiting)    . PREMATURE VENTRICULAR CONTRACTIONS   . PULMONARY EMBOLISM, HX OF 06/19/2007  . Recurrent upper respiratory infection (URI)   . Rhabdomyolysis 07/29/2009  . Tremor   . VITAMIN D DEFICIENCY     Past Surgical History:  Procedure Laterality Date  . A-FLUTTER ABLATION N/A 10/08/2016   Procedure: A-FLUTTER ABLATION;  Surgeon: Evans Lance, MD;  Location: [facility];  Service: Cardiovascular;  Laterality: N/A;  . ABDOMINAL HYSTERECTOMY    . BREAST LUMPECTOMY WITH RADIOACTIVE SEED LOCALIZATION Right 06/05/2015   Procedure: RIGHT BREAST LUMPECTOMY WITH RADIOACTIVE SEED LOCALIZATION;  Surgeon: Fanny Skates, MD;  Location: [facility];  Service: General;  Laterality: Right;  . BREAST SURGERY Left    s/p mass removal  . CARDIOVERSION N/A 12/11/2014   Procedure: CARDIOVERSION;  Surgeon: Josue Hector, MD;  Location: [facility];  Service: Cardiovascular;  Laterality: N/A;  . CARDIOVERSION N/A 09/15/2016   Procedure: CARDIOVERSION;  Surgeon: Jerline Pain, MD;  Location: [facility]OSCOPY;  Service: Cardiovascular;  Laterality: N/A;  . COLONOSCOPY  10/22/2003, ?2006  . COLONOSCOPY WITH PROPOFOL N/A 01/15/2016   Procedure: COLONOSCOPY WITH PROPOFOL;  Surgeon: Gatha Mayer, MD;  Location: [facility];  Service: Endoscopy;  Laterality: N/A;  . ESOPHAGOGASTRODUODENOSCOPY (EGD) WITH PROPOFOL N/A 01/15/2016   Procedure: ESOPHAGOGASTRODUODENOSCOPY (  EGD) WITH PROPOFOL;  Surgeon: Gatha Mayer, MD;  Location: [facility];  Service: Endoscopy;  Laterality: N/A;  . s/p left arm fracture with fall off chair    . skin graft to middle R finger  1975  . TEE WITHOUT CARDIOVERSION N/A 12/11/2014   Procedure: TRANSESOPHAGEAL ECHOCARDIOGRAM (TEE);  Surgeon: Josue Hector, MD;  Location[facility]COPY;  Service: Cardiovascular;  Laterality: N/A;  . TONSILLECTOMY      Family History  Problem Relation Age of Onset  . Heart disease Mother   . Liver disease Mother        alcohol related  . Asthma Brother   .  Esophageal cancer Brother   . Asthma Son   . Colon cancer Neg Hx   . Stomach cancer Neg Hx   . Pancreatic cancer Neg Hx   . Inflammatory bowel disease Neg Hx   . Allergic rhinitis Neg Hx   . Eczema Neg Hx   . Urticaria Neg Hx     SOCIAL HX:  Married 1 son 2 daughters Disabled 2 caffeine/day Past smoker 11/12/2015   Current Outpatient Medications:  .  apixaban (ELIQUIS) 5 MG TABS tablet, Take 1 tablet (5 mg total) by mouth 2 (two) times daily., Disp: 180 tablet, Rfl: 1 .  atorvastatin (LIPITOR) 10 MG tablet, Take 1 tablet (10 mg total) by mouth daily at 6 PM., Disp: 90 tablet, Rfl: 3 .  azelastine (ASTELIN) 0.1 % nasal spray, Place 2 sprays into both nostrils at bedtime as needed for rhinitis. Use in each nostril as directed, Disp: 90 mL, Rfl: 3 .  benzonatate (TESSALON) 200 MG capsule, Take 1 capsule (200 mg total) by mouth 3 (three) times daily as needed for cough., Disp: 30 capsule, Rfl: 5 .  Calcium Carbonate-Vitamin D 600-400 MG-UNIT per tablet, Take 1 tablet by mouth daily. , Disp: , Rfl:  .  ferrous sulfate 325 (65 FE) MG tablet, Take 1 tablet (325 mg total) by mouth 2 (two) times daily with a meal. With lunch and dinner, Disp: 180 tablet, Rfl: 3 .  FLUoxetine (PROZAC) 40 MG capsule, Take 1 capsule (40 mg total) by mouth daily., Disp: 90 capsule, Rfl: 1 .  Fluticasone-Umeclidin-Vilant (TRELEGY ELLIPTA) 100-62.5-25 MCG/INH AEPB, Inhale 1 puff into the lungs daily., Disp: 60 each, Rfl: 0 .  Fluticasone-Umeclidin-Vilant (TRELEGY ELLIPTA) 100-62.5-25 MCG/INH AEPB, Inhale 1 puff into the lungs daily., Disp: 180 each, Rfl: 3 .  furosemide (LASIX) 80 MG tablet, TAKE 1 TABLET BY MOUTH IN  THE MORNING AND 1 TABLET IN THE AFTERNOON, Disp: 180 tablet, Rfl: 0 .  glucosamine-chondroitin 500-400 MG tablet, Take 1 tablet by mouth every morning. , Disp: , Rfl:  .  KLOR-CON M20 20 MEQ tablet, TAKE 1 TABLET BY MOUTH TWO  TIMES DAILY, Disp: 180 tablet, Rfl: 2 .  levalbuterol (XOPENEX) 1.25 MG/3ML  nebulizer solution, Take 1.25 mg by nebulization every 4 (four) hours. (Patient taking differently: Take 1.25 mg by nebulization every 4 (four) hours as needed for wheezing or shortness of breath. ), Disp: 360 mL, Rfl: 11 .  levocetirizine (XYZAL) 5 MG tablet, Take 1 tablet (5 mg total) by mouth daily as needed for allergies., Disp: 90 tablet, Rfl: 3 .  metoprolol tartrate (LOPRESSOR) 25 MG tablet, Take 1 tablet (25 mg total) by mouth 2 (two) times daily., Disp: 180 tablet, Rfl: 3 .  olopatadine (PATANOL) 0.1 % ophthalmic solution, Place 1 drop into both eyes 2 (two) times daily., Disp: 15 mL, Rfl: 4 .  OXYGEN, Inhale 2.5 L/min into the lungs continuous. And 4 with exertion, Disp: , Rfl:  .  pantoprazole (PROTONIX) 40 MG tablet, Take 1 tablet (40 mg total) by mouth daily. Take 30-60 min before first meal of the day, Disp: 90 tablet, Rfl: 3 .  propafenone (RYTHMOL) 150 MG tablet, Take 1 tablet (150 mg total) by mouth every 8 (eight) hours., Disp: 270 tablet, Rfl: 3 .  sodium chloride (OCEAN) 0.65 % SOLN nasal spray, Place 2 sprays every 4 (four) hours as needed into both nostrils for congestion., Disp: , Rfl:   EXAM:  VITALS per patient if applicable:  GENERAL: alert, oriented, appears well and in no acute distress  LUNGS: on inspection no signs of respiratory distress, breathing rate appears normal, no obvious gross SOB, gasping or wheezing On 2.5 L O2 95% O2   PSYCH/NEURO: pleasant and cooperative, no obvious depression or anxiety, speech and thought processing grossly intact  ASSESSMENT AND PLAN:  Discussed the following assessment and plan:  Chronic respiratory failure with hypoxia and hypercapnia (HCC) On O2 2.5 to 3L doing well  -cont meds  -will let me know about new lung MD  Estrogen deficiency - Plan: DG Bone Density Osteopenia, unspecified location - Plan: DG Bone Density  Essential hypertension controlled  -cont meds lasix, lopressor   HM utd flu, prevnar, pna 23  Tdap  and shingrix mailed Rx to get pharmacy   HCV negative 04/11/15   Pap out of age window  mammo [facility] 05/25/17 h/o atypical hyperplasia s/p removal right breast in 2017  -pt to call and schedule with DEXA  DEXA 09/12/15 osteopenia consider repeat in 3-5 years  Colonoscopy Carle[facility] Purl 01/15/16 diverticulosis no repeat was rec due to chronic medical conditions  CT chest ordered former smoker 30 years max 1.5 ppd quit in 2008/2009  CT chest had 02/2018  H/o bl cataracts  Former PCP Dr. Cathlean Cower Cards Dr. Stanford Breed    -we discussed possible serious and likely etiologies, options for evaluation and workup, limitations of telemedicine visit vs in person visit, treatment, treatment risks and precautions. Pt prefers to treat via telemedicine empirically rather then risking or undertaking an in person visit at this moment. Patient agrees to seek prompt in person care if worsening, new symptoms arise, or if is not improving with treatment.   I discussed the assessment and treatment plan with the patient. The patient was provided an opportunity to ask questions and all were answered. The patient agreed with the plan and demonstrated an understanding of the instructions.   The patient was advised to call back or seek an in-person evaluation if the symptoms worsen or if the condition fails to improve as anticipated.   Nino Glow McLean-Scocuzza, MD

## 2018-10-03 NOTE — Telephone Encounter (Signed)
Order for dexa needs to be faxed to [facility]  And also mammogram   Inform patient and schedule please  Standard

## 2018-10-03 NOTE — Telephone Encounter (Signed)
Copied from [place] [id number]. Topic: General - Other >> Oct 03, 2018  3:49 PM Leward Quan A wrote: Reason for CRM: Patient called to say that she had her mammogram at [facility] mammography state that they will be faxing over the results but that they need an order for the bone density. Ph# [phone]

## 2018-10-09 ENCOUNTER — Encounter: Payer: Self-pay | Admitting: Internal Medicine

## 2018-10-09 ENCOUNTER — Other Ambulatory Visit: Payer: Self-pay | Admitting: Internal Medicine

## 2018-10-17 ENCOUNTER — Other Ambulatory Visit: Payer: Self-pay | Admitting: Cardiology

## 2018-10-17 MED ORDER — PROPAFENONE HCL 150 MG PO TABS: 150.0000 mg | ORAL_TABLET | Freq: Three times a day (TID) | ORAL | 2 refills | Status: DC

## 2018-10-17 NOTE — Telephone Encounter (Signed)
Pt's medication was sent to pt's pharmacy as requested. Confirmation received.  °

## 2018-11-01 DIAGNOSIS — J449 Chronic obstructive pulmonary disease, unspecified: Secondary | ICD-10-CM | POA: Diagnosis not present

## 2018-12-02 DIAGNOSIS — J449 Chronic obstructive pulmonary disease, unspecified: Secondary | ICD-10-CM | POA: Diagnosis not present

## 2018-12-12 ENCOUNTER — Other Ambulatory Visit: Payer: Self-pay | Admitting: Internal Medicine

## 2018-12-12 ENCOUNTER — Telehealth: Payer: Self-pay | Admitting: Internal Medicine

## 2018-12-12 DIAGNOSIS — K219 Gastro-esophageal reflux disease without esophagitis: Secondary | ICD-10-CM

## 2018-12-12 MED ORDER — FAMOTIDINE 20 MG PO TABS: 20.0000 mg | ORAL_TABLET | Freq: Every day | ORAL | 3 refills | Status: DC

## 2018-12-12 NOTE — Telephone Encounter (Signed)
Medication Refill - Medication: famotidine 20mg     Has the patient contacted their pharmacy? Yes.   [facility] does not have this medication in stock. Please advise.  (Agent: If no, request that the patient contact the pharmacy for the refill.) (Agent: If yes, when and what did the pharmacy advise?)  Preferred Pharmacy (with phone number or street name):  [facility], [address]  [place] [address]  Phone: [phone] Fax: [phone]  Not a 24 hour pharmacy; exact hours not known.     Agent: Please be advised that RX refills may take up to 3 business days. We ask that you follow-up with your pharmacy.

## 2018-12-13 ENCOUNTER — Ambulatory Visit: Payer: Medicare Other | Admitting: Pulmonary Disease

## 2018-12-13 ENCOUNTER — Other Ambulatory Visit: Payer: Self-pay

## 2018-12-13 ENCOUNTER — Encounter: Payer: Self-pay | Admitting: Pulmonary Disease

## 2018-12-13 VITALS — BP 120/76 | HR 58 | Temp 97.5°F | Ht 64.0 in | Wt 246.4 lb

## 2018-12-13 DIAGNOSIS — J9611 Chronic respiratory failure with hypoxia: Secondary | ICD-10-CM

## 2018-12-13 DIAGNOSIS — J9612 Chronic respiratory failure with hypercapnia: Secondary | ICD-10-CM

## 2018-12-13 DIAGNOSIS — Z87891 Personal history of nicotine dependence: Secondary | ICD-10-CM | POA: Diagnosis not present

## 2018-12-13 DIAGNOSIS — J449 Chronic obstructive pulmonary disease, unspecified: Secondary | ICD-10-CM

## 2018-12-13 DIAGNOSIS — Z86711 Personal history of pulmonary embolism: Secondary | ICD-10-CM | POA: Diagnosis not present

## 2018-12-13 NOTE — Patient Instructions (Addendum)
Thank you for visiting Dr. Valeta Harms at [facility]. Today we recommend the following:  Continue Trelegy Please call DME company regarding error message on POC If you need refills of trelegy please call   Return in about 6 months (around 06/13/2019) for with APP or Dr. Valeta Harms.    Please do your part to reduce the spread of COVID-19.

## 2018-12-13 NOTE — Addendum Note (Signed)
Addended by: Valerie Salts on: 12/13/2018 11:20 AM   Modules accepted: Orders

## 2018-12-13 NOTE — Progress Notes (Signed)
Synopsis: Referred in Dec 2020 to est care with new pulmonary provider, former patient Dr. Alva Garnet by McLean-Scocuzza, Olivia Mackie *  Subjective:   PATIENT ID: [name] [name] GENDER: female DOB: January 26, 1950, MRN: [id number]  Chief Complaint  Patient presents with  . Follow-up    Former Dr. Melvyn Novas and Dr. Alva Garnet pt for resp failire. States her breathing has been ok. Only issue she has is her POC is not working well.     PMH afib, copd FEV1 55%, morbidily obese, HTN. Currently managed on trelegy inhaler.  Overall doing well.  She has seen Dr. Melvyn Novas in the past as well as Dr. Jamal Collin.  Dr. Jamal Collin recently relocated and she is here to establish care with new pulmonary provider.  She has chronic respiratory failure on 2 L POC.  At this time doing well she did have a new error message on her POC.  We talked about calling her DME supplier to have them come out and check this.  Is currently not showing the error message today in the office.  She is using her Trelegy inhaler regularly.  This past year she has not even needed to use her rescue inhaler.  Her breathing is doing much better.  She has also been trying to lose weight and she has been successful in this.  She does take a vitamin D supplement daily.    Past Medical History:  Diagnosis Date  . Anemia    hx  . Anxiety   . Arthritis   . Asthma   . Atrial fibrillation and flutter ([place])   . Atypical lobular hyperplasia of right breast 05/09/2015  . Breast CA ([place])    ?  Marland Kitchen Chronic rhinitis   . COPD (chronic obstructive pulmonary disease) ([place])    [place]. PFTs 07/25/06 FEV1 55% ratio 53, DLC0 51% HFA 50% 12/16/2009  . Depression   . FRACTURE, RIB, RIGHT 06/18/2009  . Glucose intolerance (impaired glucose tolerance)    on steroids  . History of DVT (deep vein thrombosis)   . Hx of cardiac catheterization    LHC (01/2001):  Normal Cors.  EF 65%.;  LexiScan Myoview (03/2013): No ischemia, EF 61%, normal  . Hx of echocardiogram    Echo (11/2011):  Mod LVH,  EF 60-65%, no RWMA, MAC, mild LAE, PASP 34.  Marland Kitchen HYPERLIPIDEMIA   . Hypertension   . Hypothyroidism   . Morbid obesity ([place])    target wt=179lb for BMI<30 Peak wt 282lb  . Osteoporosis    last BMD 4/08 -2.4, intolerant of bisphosphonates  . Peripheral vascular disease (H[facility])    hx dvt.pe  . Pneumonia    hx  . PONV (postoperative nausea and vomiting)   . PREMATURE VENTRICULAR CONTRACTIONS   . PULMONARY EMBOLISM, HX OF 06/19/2007  . Recurrent upper respiratory infection (URI)   . Rhabdomyolysis 07/29/2009  . Tremor   . VITAMIN D DEFICIENCY      Family History  Problem Relation Age of Onset  . Heart disease Mother   . Liver disease Mother        alcohol related  . Asthma Brother   . Esophageal cancer Brother   . Asthma Son   . Colon cancer Neg Hx   . Stomach cancer Neg Hx   . Pancreatic cancer Neg Hx   . Inflammatory bowel disease Neg Hx   . Allergic rhinitis Neg Hx   . Eczema Neg Hx   . Urticaria Neg Hx      Past Surgical  History:  Procedure Laterality Date  . A-FLUTTER ABLATION N/A 10/08/2016   Procedure: A-FLUTTER ABLATION;  Surgeon: Evans Lance, MD;  Location: [facility];  Service: Cardiovascular;  Laterality: N/A;  . ABDOMINAL HYSTERECTOMY    . BREAST LUMPECTOMY WITH RADIOACTIVE SEED LOCALIZATION Right 06/05/2015   Procedure: RIGHT BREAST LUMPECTOMY WITH RADIOACTIVE SEED LOCALIZATION;  Surgeon: Fanny Skates, MD;  Location: [facility];  Service: General;  Laterality: Right;  . BREAST SURGERY Left    s/p mass removal  . CARDIOVERSION N/A 12/11/2014   Procedure: CARDIOVERSION;  Surgeon: Josue Hector, MD;  Location: [facility];  Service: Cardiovascular;  Laterality: N/A;  . CARDIOVERSION N/A 09/15/2016   Procedure: CARDIOVERSION;  Surgeon: Jerline Pain, MD;  Location: [facility] ENDOSCOPY;  Service: Cardiovascular;  Laterality: N/A;  . COLONOSCOPY  10/22/2003, ?2006  . COLONOSCOPY WITH PROPOFOL N/A 01/15/2016   Procedure: COLONOSCOPY WITH PROPOFOL;  Surgeon: Gatha Mayer, MD;  Location: [facility];  Service: Endoscopy;  Laterality: N/A;  . ESOPHAGOGASTRODUODENOSCOPY (EGD) WITH PROPOFOL N/A 01/15/2016   Procedure: ESOPHAGOGASTRODUODENOSCOPY (EGD) WITH PROPOFOL;  Surgeon: Gatha Mayer, MD;  Location: [facility];  Service: Endoscopy;  Laterality: N/A;  . s/p left arm fracture with fall off chair    . skin graft to middle R finger  1975  . TEE WITHOUT CARDIOVERSION N/A 12/11/2014   Procedure: TRANSESOPHAGEAL ECHOCARDIOGRAM (TEE);  Surgeon: Josue Hector, MD;  Location: [facility] ENDOSCOPY;  Service: Cardiovascular;  Laterality: N/A;  . TONSILLECTOMY      Social History   Socioeconomic History  . Marital status: Married    Spouse name: Not on file  . Number of children: 3  . Years of education: Not on file  . Highest education level: Not on file  Occupational History  . 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 Financial resource strain: Not hard at all  . Food insecurity    Worry: Never true    Inability: Never true  . Transportation needs    Medical: No    Non-medical: No  Tobacco Use  . Smoking status: Former Smoker    Packs/day: 1.00    Years: 35.00    Pack years: 35.00    Types: Cigarettes    Quit date: 01/11/2006    Years since quitting: 12.9  . Smokeless tobacco: Never Used  Substance and Sexual Activity  . Alcohol use: No    Alcohol/week: 0.0 standard drinks  . Drug use: No  . Sexual activity: Yes  Lifestyle  . Physical activity    Days per week: 0 days    Minutes per session: Not on file  . Stress: Not on file  Relationships  . Social Herbalist on phone: Not on file    Gets together: Not on file    Attends religious service: Not on file    Active member of club or organization: Not on file    Attends meetings of clubs or organizations: Not on file    Relationship status: Not on file  . Intimate partner violence    Fear of current or ex partner: No     Emotionally abused: No    Physically abused: No    Forced sexual activity: No  Other Topics Concern  . Not on file  Social History Narrative   Married 1 son 2 daughters   Disabled   2 caffeine/day   Past smoker   11/12/2015  Allergies  Allergen Reactions  . Duloxetine Other (See Comments)    REACTION: rhabdomyolysis  . Penicillins Other (See Comments)    SYNCOPE Has patient had a PCN reaction causing immediate rash, facial/tongue/throat swelling, SOB or lightheadedness with hypotension: Yes Has patient had a PCN reaction causing severe rash involving mucus membranes or skin necrosis: No Has patient had a PCN reaction that required hospitalization No Has patient had a PCN reaction occurring within the last 10 years: No If all of the above answers are "NO", then may proceed with Cephalosporin use.   . Latex Rash     Outpatient Medications Prior to Visit  Medication Sig Dispense Refill  . apixaban (ELIQUIS) 5 MG TABS tablet Take 1 tablet (5 mg total) by mouth 2 (two) times daily. 180 tablet 1  . atorvastatin (LIPITOR) 10 MG tablet Take 1 tablet (10 mg total) by mouth daily at 6 PM. 90 tablet 3  . azelastine (ASTELIN) 0.1 % nasal spray Place 2 sprays into both nostrils at bedtime as needed for rhinitis. Use in each nostril as directed 90 mL 3  . benzonatate (TESSALON) 200 MG capsule Take 1 capsule (200 mg total) by mouth 3 (three) times daily as needed for cough. 30 capsule 5  . Calcium Carbonate-Vitamin D 600-400 MG-UNIT per tablet Take 1 tablet by mouth daily.     . famotidine (PEPCID) 20 MG tablet Take 1 tablet (20 mg total) by mouth daily. 90 tablet 3  . ferrous sulfate 325 (65 FE) MG tablet Take 1 tablet (325 mg total) by mouth 2 (two) times daily with a meal. With lunch and dinner 180 tablet 3  . FLUoxetine (PROZAC) 40 MG capsule TAKE 1 CAPSULE BY MOUTH  DAILY 90 capsule 3  . Fluticasone-Umeclidin-Vilant (TRELEGY ELLIPTA) 100-62.5-25 MCG/INH AEPB Inhale 1 puff into the lungs  daily. 180 each 3  . furosemide (LASIX) 80 MG tablet TAKE 1 TABLET BY MOUTH IN  THE MORNING AND 1 TABLET IN THE AFTERNOON 180 tablet 0  . glucosamine-chondroitin 500-400 MG tablet Take 1 tablet by mouth every morning.     Marland Kitchen KLOR-CON M20 20 MEQ tablet TAKE 1 TABLET BY MOUTH TWO  TIMES DAILY 180 tablet 2  . levalbuterol (XOPENEX) 1.25 MG/3ML nebulizer solution Take 1.25 mg by nebulization every 4 (four) hours. (Patient taking differently: Take 1.25 mg by nebulization every 4 (four) hours as needed for wheezing or shortness of breath. ) 360 mL 11  . levocetirizine (XYZAL) 5 MG tablet Take 1 tablet (5 mg total) by mouth daily as needed for allergies. 90 tablet 3  . levothyroxine (SYNTHROID) 50 MCG tablet Take 50 mcg by mouth daily.    . metoprolol tartrate (LOPRESSOR) 25 MG tablet Take 1 tablet (25 mg total) by mouth 2 (two) times daily. 180 tablet 3  . olopatadine (PATANOL) 0.1 % ophthalmic solution Place 1 drop into both eyes 2 (two) times daily. 15 mL 4  . OXYGEN Inhale 2.5 L/min into the lungs continuous. And 4 with exertion    . pantoprazole (PROTONIX) 40 MG tablet Take 1 tablet (40 mg total) by mouth daily. Take 30-60 min before first meal of the day 90 tablet 3  . propafenone (RYTHMOL) 150 MG tablet Take 1 tablet (150 mg total) by mouth every 8 (eight) hours. 270 tablet 2  . sodium chloride (OCEAN) 0.65 % SOLN nasal spray Place 2 sprays every 4 (four) hours as needed into both nostrils for congestion.    . Fluticasone-Umeclidin-Vilant (TRELEGY ELLIPTA) 100-62.5-25 MCG/INH  AEPB Inhale 1 puff into the lungs daily. 60 each 0   No facility-administered medications prior to visit.     Review of Systems  Constitutional: Negative for chills, fever, malaise/fatigue and weight loss.  HENT: Negative for hearing loss, sore throat and tinnitus.   Eyes: Negative for blurred vision and double vision.  Respiratory: Positive for shortness of breath. Negative for cough, hemoptysis, sputum production,  wheezing and stridor.   Cardiovascular: Negative for chest pain, palpitations, orthopnea, leg swelling and PND.  Gastrointestinal: Negative for abdominal pain, constipation, diarrhea, heartburn, nausea and vomiting.  Genitourinary: Negative for dysuria, hematuria and urgency.  Musculoskeletal: Negative for joint pain and myalgias.  Skin: Negative for itching and rash.  Neurological: Negative for dizziness, tingling, weakness and headaches.  Endo/Heme/Allergies: Negative for environmental allergies. Does not bruise/bleed easily.  Psychiatric/Behavioral: Negative for depression. The patient is not nervous/anxious and does not have insomnia.   All other systems reviewed and are negative.    Objective:  Physical Exam Vitals signs reviewed.  Constitutional:      General: She is not in acute distress.    Appearance: She is well-developed. She is obese.  HENT:     Head: Normocephalic and atraumatic.  Eyes:     General: No scleral icterus.    Conjunctiva/sclera: Conjunctivae normal.     Pupils: Pupils are equal, round, and reactive to light.  Neck:     Musculoskeletal: Neck supple.     Vascular: No JVD.     Trachea: No tracheal deviation.  Cardiovascular:     Rate and Rhythm: Normal rate and regular rhythm.     Heart sounds: Normal heart sounds. No murmur.  Pulmonary:     Effort: Pulmonary effort is normal. No tachypnea, accessory muscle usage or respiratory distress.     Breath sounds: Normal breath sounds. No stridor. No wheezing, rhonchi or rales.  Abdominal:     General: There is no distension.     Palpations: Abdomen is soft.     Tenderness: There is no abdominal tenderness.  Musculoskeletal:        General: No tenderness.     Right lower leg: No edema.     Left lower leg: No edema.  Lymphadenopathy:     Cervical: No cervical adenopathy.  Skin:    General: Skin is warm and dry.     Capillary Refill: Capillary refill takes less than 2 seconds.     Findings: No rash.   Neurological:     Mental Status: She is alert and oriented to person, place, and time.  Psychiatric:        Behavior: Behavior normal.      Vitals:   12/13/18 1037  BP: 120/76  Pulse: (!) 58  Temp: (!) 97.5 F (36.4 C)  TempSrc: Temporal  SpO2: 100%  Weight: 246 lb 6.4 oz (111.8 kg)  Height: _0  (1.626 m)   100% on 2 L/min BMI Readings from Last 3 Encounters:  12/13/18 42.29 kg/m  10/03/18 43.77 kg/m  05/16/18 46.82 kg/m   Wt Readings from Last 3 Encounters:  12/13/18 246 lb 6.4 oz (111.8 kg)  10/03/18 255 lb (115.7 kg)  05/16/18 256 lb (116.1 kg)     CBC    Component Value Date/Time   WBC 6.1 03/09/2018 0949   RBC 3.66 (L) 03/09/2018 0949   HGB 11.1 (L) 03/09/2018 0949   HCT 32.0 (L) 03/09/2018 0949   PLT 238.0 03/09/2018 0949   MCV 87.4 03/09/2018 0949  MCH 29.5 10/07/2017 1126   MCHC 34.6 03/09/2018 0949   RDW 17.2 (H) 03/09/2018 0949   LYMPHSABS 1.8 03/09/2018 0949   MONOABS 0.4 03/09/2018 0949   EOSABS 0.1 03/09/2018 0949   BASOSABS 0.0 03/09/2018 0949    Chest Imaging: 03/23/2018: Ct Chest  - IMPRESSION: 1. Enlarged heart. Calcific atherosclerotic disease of the aorta and coronary arteries. Annular calcifications of the aortic valve. 2. Enlargement of the main pulmonary trunk, which may be seen with pulmonary arterial hypertension. 3. Mild hepatic steatosis.  Pulmonary Functions Testing Results: No flowsheet data found.  FeNO: None   Pathology: None   Echocardiogram:  07/2018: presevered EF 60-65%, elevated left ventricular EDP  Heart Catheterization: None      Assessment & Plan:      ICD-10-CM   1. Moderate COPD (chronic obstructive pulmonary disease) (HCC)  J44.9   2. Chronic respiratory failure with hypoxia and hypercapnia (HCC)  J96.11    J96.12   3. History of pulmonary embolism  Z86.711   4. Former smoker  Z87.891   5. Morbid obesity ([place])  E66.01     Discussion:  This is a 68 year old female, morbidly obese,  history of moderate COPD former smoker.  Currently managed on triple therapy regimen.  She is using Trelegy inhaler and doing well.  She has atrial fibrillation and a history of pulmonary embolism and is on full dose anticoagulation with apixaban.  She will continue this per primary.  Plan: Continue use of Trelegy inhaler daily Call for any additional refills. Continue use of albuterol as needed for shortness of breath and wheezing as needed. Continue increasing her exercise tolerance and walking. Patient was counseled on continued weight loss and encouraged her with this activity. She can follow-up in our clinic in 6 months or as needed.  Greater than 50% of this patient's 15-minute of visit was been face-to-face discussing the above recommendation and treatment plan.    Current Outpatient Medications:  .  apixaban (ELIQUIS) 5 MG TABS tablet, Take 1 tablet (5 mg total) by mouth 2 (two) times daily., Disp: 180 tablet, Rfl: 1 .  atorvastatin (LIPITOR) 10 MG tablet, Take 1 tablet (10 mg total) by mouth daily at 6 PM., Disp: 90 tablet, Rfl: 3 .  azelastine (ASTELIN) 0.1 % nasal spray, Place 2 sprays into both nostrils at bedtime as needed for rhinitis. Use in each nostril as directed, Disp: 90 mL, Rfl: 3 .  benzonatate (TESSALON) 200 MG capsule, Take 1 capsule (200 mg total) by mouth 3 (three) times daily as needed for cough., Disp: 30 capsule, Rfl: 5 .  Calcium Carbonate-Vitamin D 600-400 MG-UNIT per tablet, Take 1 tablet by mouth daily. , Disp: , Rfl:  .  famotidine (PEPCID) 20 MG tablet, Take 1 tablet (20 mg total) by mouth daily., Disp: 90 tablet, Rfl: 3 .  ferrous sulfate 325 (65 FE) MG tablet, Take 1 tablet (325 mg total) by mouth 2 (two) times daily with a meal. With lunch and dinner, Disp: 180 tablet, Rfl: 3 .  FLUoxetine (PROZAC) 40 MG capsule, TAKE 1 CAPSULE BY MOUTH  DAILY, Disp: 90 capsule, Rfl: 3 .  Fluticasone-Umeclidin-Vilant (TRELEGY ELLIPTA) 100-62.5-25 MCG/INH AEPB, Inhale 1  puff into the lungs daily., Disp: 180 each, Rfl: 3 .  furosemide (LASIX) 80 MG tablet, TAKE 1 TABLET BY MOUTH IN  THE MORNING AND 1 TABLET IN THE AFTERNOON, Disp: 180 tablet, Rfl: 0 .  glucosamine-chondroitin 500-400 MG tablet, Take 1 tablet by mouth every morning. , Disp: ,  Rfl:  .  KLOR-CON M20 20 MEQ tablet, TAKE 1 TABLET BY MOUTH TWO  TIMES DAILY, Disp: 180 tablet, Rfl: 2 .  levalbuterol (XOPENEX) 1.25 MG/3ML nebulizer solution, Take 1.25 mg by nebulization every 4 (four) hours. (Patient taking differently: Take 1.25 mg by nebulization every 4 (four) hours as needed for wheezing or shortness of breath. ), Disp: 360 mL, Rfl: 11 .  levocetirizine (XYZAL) 5 MG tablet, Take 1 tablet (5 mg total) by mouth daily as needed for allergies., Disp: 90 tablet, Rfl: 3 .  levothyroxine (SYNTHROID) 50 MCG tablet, Take 50 mcg by mouth daily., Disp: , Rfl:  .  metoprolol tartrate (LOPRESSOR) 25 MG tablet, Take 1 tablet (25 mg total) by mouth 2 (two) times daily., Disp: 180 tablet, Rfl: 3 .  olopatadine (PATANOL) 0.1 % ophthalmic solution, Place 1 drop into both eyes 2 (two) times daily., Disp: 15 mL, Rfl: 4 .  OXYGEN, Inhale 2.5 L/min into the lungs continuous. And 4 with exertion, Disp: , Rfl:  .  pantoprazole (PROTONIX) 40 MG tablet, Take 1 tablet (40 mg total) by mouth daily. Take 30-60 min before first meal of the day, Disp: 90 tablet, Rfl: 3 .  propafenone (RYTHMOL) 150 MG tablet, Take 1 tablet (150 mg total) by mouth every 8 (eight) hours., Disp: 270 tablet, Rfl: 2 .  sodium chloride (OCEAN) 0.65 % SOLN nasal spray, Place 2 sprays every 4 (four) hours as needed into both nostrils for congestion., Disp: , Rfl:    Garner Nash, DO [facility] 12/13/2018 10:57 AM

## 2018-12-14 ENCOUNTER — Other Ambulatory Visit: Payer: Self-pay | Admitting: Cardiology

## 2018-12-22 NOTE — Progress Notes (Signed)
HPI: FU AFib/flutter, prior pulmonary embolism, chronic coumadin, diastolic dysfunction, morbid obesity, COPD, LE edema. Cardiac cath in 2003 was normal. Nuclear study March 2015 showed an ejection fraction 61% and normal perfusion. Seen November 2016 by Dr. Martinique and noted to be in atrial flutter. Subsequently had TEE cardioversion 11/16; TEE revealed normal LV function; mild MR; mild biatrial enlargement.Had atrial flutter ablation September 2018. Recurrent atrial fibrillation October 2018requiring cardioversion. Echocardiogram July 2020 showed normal LV function, moderate left ventricular hypertrophy, grade 2 diastolic dysfunction, severe left atrial enlargement, mild aortic stenosis with mean gradient 11 mmHg, trace aortic insufficiency.  Since she was last seen  she denies increased dyspnea, chest pain, palpitations or syncope.  No pedal edema.  No bleeding.  Current Outpatient Medications  Medication Sig Dispense Refill  . atorvastatin (LIPITOR) 10 MG tablet Take 1 tablet (10 mg total) by mouth daily at 6 PM. 90 tablet 3  . azelastine (ASTELIN) 0.1 % nasal spray Place 2 sprays into both nostrils at bedtime as needed for rhinitis. Use in each nostril as directed 90 mL 3  . benzonatate (TESSALON) 200 MG capsule Take 1 capsule (200 mg total) by mouth 3 (three) times daily as needed for cough. 30 capsule 5  . Calcium Carbonate-Vitamin D 600-400 MG-UNIT per tablet Take 1 tablet by mouth daily.     Marland Kitchen ELIQUIS 5 MG TABS tablet TAKE 1 TABLET BY MOUTH  TWICE A DAY 180 tablet 1  . famotidine (PEPCID) 20 MG tablet Take 1 tablet (20 mg total) by mouth daily. 90 tablet 3  . ferrous sulfate 325 (65 FE) MG tablet Take 1 tablet (325 mg total) by mouth 2 (two) times daily with a meal. With lunch and dinner 180 tablet 3  . FLUoxetine (PROZAC) 40 MG capsule TAKE 1 CAPSULE BY MOUTH  DAILY 90 capsule 3  . Fluticasone-Umeclidin-Vilant (TRELEGY ELLIPTA) 100-62.5-25 MCG/INH AEPB Inhale 1 puff into the lungs  daily. 180 each 3  . furosemide (LASIX) 80 MG tablet TAKE 1 TABLET BY MOUTH IN  THE MORNING AND 1 TABLET IN THE AFTERNOON 180 tablet 0  . glucosamine-chondroitin 500-400 MG tablet Take 1 tablet by mouth every morning.     Marland Kitchen KLOR-CON M20 20 MEQ tablet TAKE 1 TABLET BY MOUTH TWO  TIMES DAILY 180 tablet 2  . levalbuterol (XOPENEX) 1.25 MG/3ML nebulizer solution Take 1.25 mg by nebulization every 4 (four) hours. (Patient taking differently: Take 1.25 mg by nebulization every 4 (four) hours as needed for wheezing or shortness of breath. ) 360 mL 11  . levocetirizine (XYZAL) 5 MG tablet Take 1 tablet (5 mg total) by mouth daily as needed for allergies. 90 tablet 3  . levothyroxine (SYNTHROID) 50 MCG tablet Take 50 mcg by mouth daily.    . metoprolol tartrate (LOPRESSOR) 25 MG tablet Take 1 tablet (25 mg total) by mouth 2 (two) times daily. 180 tablet 3  . olopatadine (PATANOL) 0.1 % ophthalmic solution Place 1 drop into both eyes 2 (two) times daily. 15 mL 4  . OXYGEN Inhale 2.5 L/min into the lungs continuous. And 4 with exertion    . pantoprazole (PROTONIX) 40 MG tablet Take 1 tablet (40 mg total) by mouth daily. Take 30-60 min before first meal of the day 90 tablet 3  . propafenone (RYTHMOL) 150 MG tablet Take 1 tablet (150 mg total) by mouth every 8 (eight) hours. 270 tablet 2  . sodium chloride (OCEAN) 0.65 % SOLN nasal spray Place 2 sprays  every 4 (four) hours as needed into both nostrils for congestion.     No current facility-administered medications for this visit.     Past Medical History:  Diagnosis Date  . Anemia    hx  . Anxiety   . Arthritis   . Asthma   . Atrial fibrillation and flutter ([place])   . Atypical lobular hyperplasia of right breast 05/09/2015  . Breast CA ([place])    ?  Marland Kitchen Chronic rhinitis   . COPD (chronic obstructive pulmonary disease) ([place])    [place]. PFTs 07/25/06 FEV1 55% ratio 53, DLC0 51% HFA 50% 12/16/2009  . Depression   . FRACTURE, RIB, RIGHT 06/18/2009  . Glucose  intolerance (impaired glucose tolerance)    on steroids  . History of DVT (deep vein thrombosis)   . Hx of cardiac catheterization    LHC (01/2001):  Normal Cors.  EF 65%.;  LexiScan Myoview (03/2013): No ischemia, EF 61%, normal  . Hx of echocardiogram    Echo (11/2011):  Mod LVH, EF 60-65%, no RWMA, MAC, mild LAE, PASP 34.  Marland Kitchen HYPERLIPIDEMIA   . Hypertension   . Hypothyroidism   . Morbid obesity ([place])    target wt=179lb for BMI<30 Peak wt 282lb  . Osteoporosis    last BMD 4/08 -2.4, intolerant of bisphosphonates  . Peripheral vascular disease ([facility])    hx dvt.pe  . Pneumonia    hx  . PONV (postoperative nausea and vomiting)   . PREMATURE VENTRICULAR CONTRACTIONS   . PULMONARY EMBOLISM, HX OF 06/19/2007  . Recurrent upper respiratory infection (URI)   . Rhabdomyolysis 07/29/2009  . Tremor   . VITAMIN D DEFICIENCY     Past Surgical History:  Procedure Laterality Date  . A-FLUTTER ABLATION N/A 10/08/2016   Procedure: A-FLUTTER ABLATION;  Surgeon: Evans Lance, MD;  Location: [facility];  Service: Cardiovascular;  Laterality: N/A;  . ABDOMINAL HYSTERECTOMY    . BREAST LUMPECTOMY WITH RADIOACTIVE SEED LOCALIZATION Right 06/05/2015   Procedure: RIGHT BREAST LUMPECTOMY WITH RADIOACTIVE SEED LOCALIZATION;  Surgeon: Fanny Skates, MD;  Location: [facility];  Service: General;  Laterality: Right;  . BREAST SURGERY Left    s/p mass removal  . CARDIOVERSION N/A 12/11/2014   Procedure: CARDIOVERSION;  Surgeon: Josue Hector, MD;  Location: [facility];  Service: Cardiovascular;  Laterality: N/A;  . CARDIOVERSION N/A 09/15/2016   Procedure: CARDIOVERSION;  Surgeon: Jerline Pain, MD;  Location: [facility];  Service: Cardiovascular;  Laterality: N/A;  . COLONOSCOPY  10/22/2003, ?2006  . COLONOSCOPY WITH PROPOFOL N/A 01/15/2016   Procedure: COLONOSCOPY WITH PROPOFOL;  Surgeon: Gatha Mayer, MD;  Location: [facility] ENDOSCOPY;  Service: Endoscopy;  Laterality: N/A;  . ESOPHAGOGASTRODUODENOSCOPY  (EGD) WITH PROPOFOL N/A 01/15/2016   Procedure: ESOPHAGOGASTRODUODENOSCOPY (EGD) WITH PROPOFOL;  Surgeon: Gatha Mayer, MD;  Location: [facility] ENDOSCOPY;  Service: Endoscopy;  Laterality: N/A;  . s/p left arm fracture with fall off chair    . skin graft to middle R finger  1975  . TEE WITHOUT CARDIOVERSION N/A 12/11/2014   Procedure: TRANSESOPHAGEAL ECHOCARDIOGRAM (TEE);  Surgeon: Josue Hector, MD;  Location: [facility] ENDOSCOPY;  Service: Cardiovascular;  Laterality: N/A;  . TONSILLECTOMY      Social History   Socioeconomic History  . Marital status: Married    Spouse name: Not on file  . Number of children: 3  . Years of education: Not on file  . Highest education level: Not on file  Occupational History  . 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 Smoking status: Former Smoker    Packs/day: 1.00    Years: 35.00    Pack years: 35.00    Types: Cigarettes    Quit date: 01/11/2006    Years since quitting: 12.9  . Smokeless tobacco: Never Used  Substance and Sexual Activity  . Alcohol use: No    Alcohol/week: 0.0 standard drinks  . Drug use: No  . Sexual activity: Yes  Other Topics Concern  . Not on file  Social History Narrative   Married 1 son 2 daughters   Disabled   2 caffeine/day   Past smoker   11/12/2015   Social Determinants of Health   Financial Resource Strain: Low Risk   . Difficulty of Paying Living Expenses: Not hard at all  Food Insecurity: No Food Insecurity  . Worried About Charity fundraiser in the Last Year: Never true  . Ran Out of Food in the Last Year: Never true  Transportation Needs: No Transportation Needs  . Lack of Transportation (Medical): No  . Lack of Transportation (Non-Medical): No  Physical Activity: Unknown  . Days of Exercise per Week: 0 days  . Minutes of Exercise per Session: Not on file  Stress:   . Feeling of Stress : Not on file  Social Connections:   . Frequency of Communication with  Friends and Family: Not on file  . Frequency of Social Gatherings with Friends and Family: Not on file  . Attends Religious Services: Not on file  . Active Member of Clubs or Organizations: Not on file  . Attends Archivist Meetings: Not on file  . Marital Status: Not on file  Intimate Partner Violence: Not At Risk  . Fear of Current or Ex-Partner: No  . Emotionally Abused: No  . Physically Abused: No  . Sexually Abused: No    Family History  Problem Relation Age of Onset  . Heart disease Mother   . Liver disease Mother        alcohol related  . Asthma Brother   . Esophageal cancer Brother   . Asthma Son   . Colon cancer Neg Hx   . Stomach cancer Neg Hx   . Pancreatic cancer Neg Hx   . Inflammatory bowel disease Neg Hx   . Allergic rhinitis Neg Hx   . Eczema Neg Hx   . Urticaria Neg Hx     ROS: no fevers or chills, productive cough, hemoptysis, dysphasia, odynophagia, melena, hematochezia, dysuria, hematuria, rash, seizure activity, orthopnea, PND, pedal edema, claudication. Remaining systems are negative.  Physical Exam: Well-developed obese in no acute distress.  Skin is warm and dry.  HEENT is normal.  Neck is supple.  Chest is clear to auscultation with normal expansion.  Cardiovascular exam is regular rate and rhythm. 2/6systolic murmur Abdominal exam nontender or distended. No masses palpated. Extremities show no edema. neuro grossly intact  ECG-sinus rhythm at a rate of 71, nonspecific ST changes.  Personally reviewed  A/P  1 paroxysmal atrial fibrillation-no recurrences based on patient's history.  Continue propafenone and apixaban.  Continue beta-blocker. Check hgb and renal function.  2 history of moderate aortic stenosis-she will need follow-up echoes in the future.  If aortic stenosis became severe she may be a candidate for TAVR.  3 chronic diastolic congestive heart failure-volume status appears to be reasonable.  Continue present dose of  diuretic.  Check potassium and renal function.  4 hypertension-blood pressure elevated; add amlodipine 5 mg daily.  Follow and adjust regimen as needed.  5 hyperlipidemia-continue statin.  Check lipids and liver.  6 obesity-we discussed the importance of weight loss.  Kirk Ruths, MD

## 2018-12-28 ENCOUNTER — Other Ambulatory Visit: Payer: Self-pay

## 2018-12-28 ENCOUNTER — Encounter: Payer: Self-pay | Admitting: Cardiology

## 2018-12-28 ENCOUNTER — Ambulatory Visit: Payer: Medicare Other | Admitting: Cardiology

## 2018-12-28 VITALS — BP 152/76 | HR 71 | Ht 62.0 in | Wt 246.0 lb

## 2018-12-28 DIAGNOSIS — I350 Nonrheumatic aortic (valve) stenosis: Secondary | ICD-10-CM

## 2018-12-28 DIAGNOSIS — I480 Paroxysmal atrial fibrillation: Secondary | ICD-10-CM

## 2018-12-28 DIAGNOSIS — I10 Essential (primary) hypertension: Secondary | ICD-10-CM | POA: Diagnosis not present

## 2018-12-28 DIAGNOSIS — E7800 Pure hypercholesterolemia, unspecified: Secondary | ICD-10-CM | POA: Diagnosis not present

## 2018-12-28 LAB — CBC
Hematocrit: 35.1 % (ref 34.0–46.6)
Hemoglobin: 11.4 g/dL (ref 11.1–15.9)
MCH: 28.6 pg (ref 26.6–33.0)
MCHC: 32.5 g/dL (ref 31.5–35.7)
MCV: 88 fL (ref 79–97)
Platelets: 272 10*3/uL (ref 150–450)
RBC: 3.98 x10E6/uL (ref 3.77–5.28)
RDW: 15.5 % — ABNORMAL HIGH (ref 11.7–15.4)
WBC: 5.9 10*3/uL (ref 3.4–10.8)

## 2018-12-28 LAB — LIPID PANEL
Chol/HDL Ratio: 3.8 ratio (ref 0.0–4.4)
Cholesterol, Total: 152 mg/dL (ref 100–199)
HDL: 40 mg/dL (ref >39)
LDL Chol Calc (NIH): 93 mg/dL (ref 0–99)
Triglycerides: 102 mg/dL (ref 0–149)
VLDL Cholesterol Cal: 19 mg/dL (ref 5–40)

## 2018-12-28 LAB — BASIC METABOLIC PANEL
BUN/Creatinine Ratio: 12 (ref 12–28)
BUN: 9 mg/dL (ref 8–27)
CO2: 26 mmol/L (ref 20–29)
Calcium: 8.7 mg/dL (ref 8.7–10.3)
Chloride: 103 mmol/L (ref 96–106)
Creatinine, Ser: 0.77 mg/dL (ref 0.57–1.00)
GFR calc Af Amer: 92 mL/min/{1.73_m2} (ref >59)
GFR calc non Af Amer: 80 mL/min/{1.73_m2} (ref >59)
Glucose: 122 mg/dL — ABNORMAL HIGH (ref 65–99)
Potassium: 4 mmol/L (ref 3.5–5.2)
Sodium: 145 mmol/L — ABNORMAL HIGH (ref 134–144)

## 2018-12-28 MED ORDER — AMLODIPINE BESYLATE 5 MG PO TABS: 5.0000 mg | ORAL_TABLET | Freq: Every day | ORAL | 3 refills | Status: DC

## 2018-12-28 NOTE — Patient Instructions (Signed)
Medication Instructions:  START AMLODIPINE 5 MG ONCE DAILY *If you need a refill on your cardiac medications before your next appointment, please call your pharmacy*  Lab Work: Your physician recommends that you HAVE LAB WORK TODAY If you have labs (blood work) drawn today and your tests are completely normal, you will receive your results only by: Marland Kitchen MyChart Message (if you have MyChart) OR . A paper copy in the mail If you have any lab test that is abnormal or we need to change your treatment, we will call you to review the results.  Follow-Up: At [facility], you and your health needs are our priority.  As part of our continuing mission to provide you with exceptional heart care, we have created designated Provider Care Teams.  These Care Teams include your primary Cardiologist (physician) and Advanced Practice Providers (APPs -  Physician Assistants and Nurse Practitioners) who all work together to provide you with the care you need, when you need it.  Your next appointment:   6 month(s)  The format for your next appointment:   Either In Person or Virtual  Provider:   You may see Kirk Ruths, MD or one of the following Advanced Practice Providers on your designated Care Team:    Kerin Ransom, PA-C  [place], [place]  Coletta Memos, [place]

## 2019-01-01 DIAGNOSIS — J449 Chronic obstructive pulmonary disease, unspecified: Secondary | ICD-10-CM | POA: Diagnosis not present

## 2019-02-01 DIAGNOSIS — J449 Chronic obstructive pulmonary disease, unspecified: Secondary | ICD-10-CM | POA: Diagnosis not present

## 2019-02-03 ENCOUNTER — Other Ambulatory Visit: Payer: Self-pay | Admitting: Cardiology

## 2019-02-26 ENCOUNTER — Other Ambulatory Visit: Payer: Self-pay | Admitting: Internal Medicine

## 2019-03-02 ENCOUNTER — Other Ambulatory Visit: Payer: Self-pay

## 2019-03-02 MED ORDER — AMLODIPINE BESYLATE 5 MG PO TABS: 5.0000 mg | ORAL_TABLET | Freq: Every day | ORAL | 3 refills | Status: DC

## 2019-03-04 ENCOUNTER — Other Ambulatory Visit: Payer: Self-pay | Admitting: Internal Medicine

## 2019-03-04 DIAGNOSIS — J449 Chronic obstructive pulmonary disease, unspecified: Secondary | ICD-10-CM | POA: Diagnosis not present

## 2019-03-04 DIAGNOSIS — K219 Gastro-esophageal reflux disease without esophagitis: Secondary | ICD-10-CM

## 2019-03-04 MED ORDER — FAMOTIDINE 20 MG PO TABS: 20.0000 mg | ORAL_TABLET | Freq: Every day | ORAL | 3 refills | Status: AC

## 2019-03-06 ENCOUNTER — Other Ambulatory Visit: Payer: Self-pay | Admitting: Internal Medicine

## 2019-03-06 DIAGNOSIS — I4891 Unspecified atrial fibrillation: Secondary | ICD-10-CM

## 2019-03-06 DIAGNOSIS — I4892 Unspecified atrial flutter: Secondary | ICD-10-CM

## 2019-03-06 MED ORDER — METOPROLOL TARTRATE 25 MG PO TABS: 25.0000 mg | ORAL_TABLET | Freq: Two times a day (BID) | ORAL | 3 refills | Status: DC

## 2019-03-23 ENCOUNTER — Ambulatory Visit: Payer: Medicare Other | Attending: Internal Medicine

## 2019-03-23 DIAGNOSIS — Z23 Encounter for immunization: Secondary | ICD-10-CM

## 2019-03-23 NOTE — Progress Notes (Signed)
   Covid-19 Vaccination Clinic  Name:  [name] [name]    MRN: [id number] DOB: 02-Jan-1951  03/23/2019  [name] was observed post Covid-19 immunization for 15 minutes without incident. She was provided with Vaccine Information Sheet and instruction to access the V-Safe system.   [name] was instructed to call 911 with any severe reactions post vaccine: Marland Kitchen Difficulty breathing  . Swelling of face and throat  . A fast heartbeat  . A bad rash all over body  . Dizziness and weakness   Immunizations Administered    Name Date Dose VIS Date Route   Pfizer COVID-19 Vaccine 03/23/2019  1:09 PM 0.3 mL 12/22/2018 Intramuscular   Manufacturer: [place]   Lot: [id number]   [place]: [id number]

## 2019-03-26 ENCOUNTER — Other Ambulatory Visit: Payer: Self-pay | Admitting: Cardiology

## 2019-03-30 ENCOUNTER — Other Ambulatory Visit: Payer: Self-pay

## 2019-03-31 ENCOUNTER — Telehealth: Payer: Self-pay | Admitting: Physician Assistant

## 2019-03-31 NOTE — Telephone Encounter (Signed)
Ms. Bunting called because she thinks she has gone back into atrial fibrillation.  She had a mechanical fall about a week ago.  She had significant bruising on her wrist and left chest, these bruises are improving.  She feels that her wrist is moving fine and does not think she broke anything.  She has developed palpitations, and feels that she has gone back into atrial fibrillation.  On her home blood pressure machine, she has noted that her heart rate has been high at times and she feels that it is irregular.  She just does not feel very good.  She has not had chest pain.  She has not had lower extremity edema.  She has not had orthopnea or PND.  She has had palpitations.  She has not had presyncope or syncope.  She assures me that the fall was a mechanical fall trying to carry groceries and her oxygen at the same time.  I reviewed the situation and her medications with her.  She is compliant with her Rythmol 150 mg twice daily, her metoprolol 25 mg twice daily, and Eliquis 5 mg twice daily.  She took her blood pressure while she was on the phone with me.  It was [id number] systolic with a heart rate of 101.  At times, her heart rate has been 140.  I discussed the possibility of her going to the hospital and she does not wish to do that at this time.  She prefers outpatient management if possible.  I requested that she increase the metoprolol to 50 mg twice daily and track her blood pressure daily.  If she starts to feel dizzy or lightheaded or feels that she is not tolerating the higher dose of metoprolol, she is to call 911 and come in.    If she tolerates the higher dose of metoprolol, okay to wait until next week to be seen.   I advised that I would send a message to the office so that she could get a call about a follow-up appointment on Monday.  She is in agreement with this as a plan of care.  She will call 911 or recontact [place] if her symptoms worsen.  Rosaria Ferries,  PA-C 03/31/2019 3:43 PM

## 2019-04-01 DIAGNOSIS — J449 Chronic obstructive pulmonary disease, unspecified: Secondary | ICD-10-CM | POA: Diagnosis not present

## 2019-04-02 ENCOUNTER — Encounter: Payer: Self-pay | Admitting: Physician Assistant

## 2019-04-02 ENCOUNTER — Other Ambulatory Visit: Payer: Self-pay

## 2019-04-02 ENCOUNTER — Other Ambulatory Visit: Payer: Self-pay | Admitting: Family Medicine

## 2019-04-02 ENCOUNTER — Ambulatory Visit (INDEPENDENT_AMBULATORY_CARE_PROVIDER_SITE_OTHER): Payer: Medicare Other

## 2019-04-02 ENCOUNTER — Ambulatory Visit: Payer: Medicare Other | Admitting: Family Medicine

## 2019-04-02 VITALS — BP 98/67 | HR 120 | Ht 62.0 in | Wt 246.0 lb

## 2019-04-02 VITALS — BP 110/68 | HR 122 | Temp 97.1°F | Ht 62.0 in | Wt 251.6 lb

## 2019-04-02 DIAGNOSIS — I480 Paroxysmal atrial fibrillation: Secondary | ICD-10-CM | POA: Diagnosis not present

## 2019-04-02 DIAGNOSIS — I350 Nonrheumatic aortic (valve) stenosis: Secondary | ICD-10-CM

## 2019-04-02 DIAGNOSIS — E782 Mixed hyperlipidemia: Secondary | ICD-10-CM | POA: Diagnosis not present

## 2019-04-02 DIAGNOSIS — I5032 Chronic diastolic (congestive) heart failure: Secondary | ICD-10-CM

## 2019-04-02 DIAGNOSIS — I4891 Unspecified atrial fibrillation: Secondary | ICD-10-CM

## 2019-04-02 DIAGNOSIS — Z0000 Encounter for general adult medical examination without abnormal findings: Secondary | ICD-10-CM | POA: Diagnosis not present

## 2019-04-02 DIAGNOSIS — I10 Essential (primary) hypertension: Secondary | ICD-10-CM

## 2019-04-02 DIAGNOSIS — Z01810 Encounter for preprocedural cardiovascular examination: Secondary | ICD-10-CM | POA: Diagnosis not present

## 2019-04-02 DIAGNOSIS — I4892 Unspecified atrial flutter: Secondary | ICD-10-CM

## 2019-04-02 MED ORDER — METOPROLOL TARTRATE 50 MG PO TABS: 50.0000 mg | ORAL_TABLET | Freq: Two times a day (BID) | ORAL | 3 refills | Status: DC

## 2019-04-02 NOTE — Progress Notes (Addendum)
Subjective:   [name] [name] is a 69 y.o. female who presents for Medicare Annual (Subsequent) preventive examination.  Review of Systems:  No ROS.  Medicare Wellness Virtual Visit.  Visual/audio telehealth visit. Vital signs provided by patient.  See social history for additional risk factors.   Cardiac Risk Factors include: advanced age (>22mn, >>9women);hypertension     Objective:     Vitals: BP 98/67 (BP Location: Right Arm, Patient Position: Sitting, Cuff Size: Normal) Comment: Deferred due to audio visit  Pulse (!) 120   Ht _0  (1.575 m)   Wt 246 lb (111.6 kg)   BMI 44.99 kg/m   Body mass index is 44.99 kg/m.  Advanced Directives 04/02/2019 03/30/2018 10/07/2017 11/18/2016 10/14/2016 10/07/2016 10/06/2016  Does Patient Have a Medical Advance Directive? Yes Yes _1   Type of Advance Directive Out of facility DNR (pink MOST or yellow form) Out of facility DNR (pink MOST or yellow form) - - - - -  Does patient want to make changes to medical advance directive? - No - Patient declined - - - - -  Would patient like information on creating a medical advance directive? - - No - Patient declined No - Patient declined - No - Patient declined -  Pre-existing out of facility DNR order (yellow form or pink MOST form) - Yellow form placed in chart (order not valid for inpatient use) - - - - -    Tobacco Social History   Tobacco Use  Smoking Status Former Smoker  . Packs/day: 1.00  . Years: 35.00  . Pack years: 35.00  . Types: Cigarettes  . Quit date: 01/11/2006  . Years since quitting: 13.2  Smokeless Tobacco Never Used     Counseling given: Not Answered   Clinical Intake:  Pre-visit preparation completed: Yes        Diabetes: No  How often do you need to have someone help you when you read instructions, pamphlets, or other written materials from your doctor or pharmacy?: 1 - Never  Interpreter Needed?: No     Past Medical History:  Diagnosis Date    . Anemia    hx  . Anxiety   . Arthritis   . Asthma   . Atrial fibrillation and flutter ([facility]   . Atypical lobular hyperplasia of right breast 05/09/2015  . Breast CA ([place]    ?  .Marland KitchenChronic rhinitis   . COPD (chronic obstructive pulmonary disease) ([place]. PFTs 07/25/06 FEV1 55% ratio 53, DLC0 51% HFA 50% 12/16/2009  . Depression   . FRACTURE, RIB, RIGHT 06/18/2009  . Glucose intolerance (impaired glucose tolerance)    on steroids  . History of DVT (deep vein thrombosis)   . Hx of cardiac catheterization    LHC (01/2001):  Normal Cors.  EF 65%.;  LexiScan Myoview (03/2013): No ischemia, EF 61%, normal  . Hx of echocardiogram    Echo (11/2011):  Mod LVH, EF 60-65%, no RWMA, MAC, mild LAE, PASP 34.  .Marland KitchenHYPERLIPIDEMIA   . Hypertension   . Hypothyroidism   . Morbid obesity ([facility]    target wt=179lb for BMI<30 Peak wt 282lb  . Osteoporosis    last BMD 4/08 -2.4, intolerant of bisphosphonates  . Peripheral vascular disease (HCC)    hx dvt.pe  . Pneumonia    hx  . PONV (postoperative nausea and vomiting)   . PREMATURE VENTRICULAR CONTRACTIONS   . PULMONARY EMBOLISM, HX OF 06/19/2007  .  Recurrent upper respiratory infection (URI)   . Rhabdomyolysis 07/29/2009  . Tremor   . VITAMIN D DEFICIENCY    Past Surgical History:  Procedure Laterality Date  . A-FLUTTER ABLATION N/A 10/08/2016   Procedure: A-FLUTTER ABLATION;  Surgeon: Evans Lance, MD;  Location: [facility];  Service: Cardiovascular;  Laterality: N/A;  . ABDOMINAL HYSTERECTOMY    . BREAST LUMPECTOMY WITH RADIOACTIVE SEED LOCALIZATION Right 06/05/2015   Procedure: RIGHT BREAST LUMPECTOMY WITH RADIOACTIVE SEED LOCALIZATION;  Surgeon: Fanny Skates, MD;  Location: [facility];  Service: General;  Laterality: Right;  . BREAST SURGERY Left    s/p mass removal  . CARDIOVERSION N/A 12/11/2014   Procedure: CARDIOVERSION;  Surgeon: Josue Hector, MD;  Location: [facility];  Service: Cardiovascular;  Laterality: N/A;  .  CARDIOVERSION N/A 09/15/2016   Procedure: CARDIOVERSION;  Surgeon: Jerline Pain, MD;  Location: [facility] ENDOSCOPY;  Service: Cardiovascular;  Laterality: N/A;  . COLONOSCOPY  10/22/2003, ?2006  . COLONOSCOPY WITH PROPOFOL N/A 01/15/2016   Procedure: COLONOSCOPY WITH PROPOFOL;  Surgeon: Gatha Mayer, MD;  Location: [facility] ENDOSCOPY;  Service: Endoscopy;  Laterality: N/A;  . ESOPHAGOGASTRODUODENOSCOPY (EGD) WITH PROPOFOL N/A 01/15/2016   Procedure: ESOPHAGOGASTRODUODENOSCOPY (EGD) WITH PROPOFOL;  Surgeon: Gatha Mayer, MD;  Location: [facility]OSCOPY;  Service: Endoscopy;  Laterality: N/A;  . s/p left arm fracture with fall off chair    . skin graft to middle R finger  1975  . TEE WITHOUT CARDIOVERSION N/A 12/11/2014   Procedure: TRANSESOPHAGEAL ECHOCARDIOGRAM (TEE);  Surgeon: Josue Hector, MD;  Location: [facility] ENDOSCOPY;  Service: Cardiovascular;  Laterality: N/A;  . TONSILLECTOMY     Family History  Problem Relation Age of Onset  . Heart disease Mother   . Liver disease Mother        alcohol related  . Asthma Brother   . Esophageal cancer Brother   . Asthma Son   . Colon cancer Neg Hx   . Stomach cancer Neg Hx   . Pancreatic cancer Neg Hx   . Inflammatory bowel disease Neg Hx   . Allergic rhinitis Neg Hx   . Eczema Neg Hx   . Urticaria Neg Hx    Social History   Socioeconomic History  . Marital status: Married    Spouse name: Not on file  . Number of children: 3  . Years of education: Not on file  . Highest education level: Not on file  Occupational History  . 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 Smoking status: Former Smoker    Packs/day: 1.00    Years: 35.00    Pack years: 35.00    Types: Cigarettes    Quit date: 01/11/2006    Years since quitting: 13.2  . Smokeless tobacco: Never Used  Substance and Sexual Activity  . Alcohol use: No    Alcohol/week: 0.0 standard drinks  . Drug use: No  . Sexual activity: Yes  Other  Topics Concern  . Not on file  Social History Narrative   Married 1 son 2 daughters   Disabled   2 caffeine/day   Past smoker   11/12/2015   Social Determinants of Health   Financial Resource Strain:   . Difficulty of Paying Living Expenses:   Food Insecurity:   . Worried About Charity fundraiser in the Last Year:   . Arboriculturist in the Last Year:   Transportation Needs:   .  Lack of Transportation (Medical):   Marland Kitchen Lack of Transportation (Non-Medical):   Physical Activity:   . Days of Exercise per Week:   . Minutes of Exercise per Session:   Stress:   . Feeling of Stress :   Social Connections:   . Frequency of Communication with Friends and Family:   . Frequency of Social Gatherings with Friends and Family:   . Attends Religious Services:   . Active Member of Clubs or Organizations:   . Attends Archivist Meetings:   Marland Kitchen Marital Status:     Outpatient Encounter Medications as of 04/02/2019  Medication Sig  . amLODipine (NORVASC) 5 MG tablet Take 1 tablet (5 mg total) by mouth daily.  Marland Kitchen atorvastatin (LIPITOR) 10 MG tablet Take 1 tablet (10 mg total) by mouth daily at 6 PM.  . azelastine (ASTELIN) 0.1 % nasal spray Place 2 sprays into both nostrils at bedtime as needed for rhinitis. Use in each nostril as directed  . benzonatate (TESSALON) 200 MG capsule Take 1 capsule (200 mg total) by mouth 3 (three) times daily as needed for cough.  . Calcium Carbonate-Vitamin D 600-400 MG-UNIT per tablet Take 1 tablet by mouth daily.   Marland Kitchen ELIQUIS 5 MG TABS tablet TAKE 1 TABLET BY MOUTH  TWICE A DAY  . famotidine (PEPCID) 20 MG tablet Take 1 tablet (20 mg total) by mouth daily. At bedtime  . ferrous sulfate 325 (65 FE) MG tablet Take 1 tablet (325 mg total) by mouth 2 (two) times daily with a meal. With lunch and dinner  . FLUoxetine (PROZAC) 40 MG capsule TAKE 1 CAPSULE BY MOUTH  DAILY  . Fluticasone-Umeclidin-Vilant (TRELEGY ELLIPTA) 100-62.5-25 MCG/INH AEPB Inhale 1 puff into  the lungs daily.  . furosemide (LASIX) 80 MG tablet TAKE 1 TABLET BY MOUTH IN  THE MORNING AND 1 TABLET IN THE AFTERNOON  . glucosamine-chondroitin 500-400 MG tablet Take 1 tablet by mouth every morning.   Marland Kitchen KLOR-CON M20 20 MEQ tablet TAKE 1 TABLET BY MOUTH  TWICE DAILY  . levalbuterol (XOPENEX) 1.25 MG/3ML nebulizer solution Take 1.25 mg by nebulization every 4 (four) hours. (Patient taking differently: Take 1.25 mg by nebulization every 4 (four) hours as needed for wheezing or shortness of breath. )  . levocetirizine (XYZAL) 5 MG tablet Take 1 tablet (5 mg total) by mouth daily as needed for allergies.  Marland Kitchen levothyroxine (SYNTHROID) 50 MCG tablet Take 50 mcg by mouth daily.  . metoprolol tartrate (LOPRESSOR) 25 MG tablet Take 1 tablet (25 mg total) by mouth 2 (two) times daily.  Marland Kitchen olopatadine (PATANOL) 0.1 % ophthalmic solution Place 1 drop into both eyes 2 (two) times daily.  . OXYGEN Inhale 2.5 L/min into the lungs continuous. And 4 with exertion  . pantoprazole (PROTONIX) 40 MG tablet TAKE 1 TABLET BY MOUTH  DAILY. TAKE 30-60 MINUTES  BEFORE FIRST MEAL OF THE  DAY  . propafenone (RYTHMOL) 150 MG tablet Take 1 tablet (150 mg total) by mouth every 8 (eight) hours.  . sodium chloride (OCEAN) 0.65 % SOLN nasal spray Place 2 sprays every 4 (four) hours as needed into both nostrils for congestion.   No facility-administered encounter medications on file as of 04/02/2019.    Activities of Daily Living In your present state of health, do you have any difficulty performing the following activities: 04/02/2019  Hearing? N  Vision? N  Difficulty concentrating or making decisions? N  Walking or climbing stairs? Y  Comment Unsteady gait; walker/cane in  use  Dressing or bathing? N  Doing errands, shopping? N  Preparing Food and eating ? N  Using the Toilet? N  In the past six months, have you accidently leaked urine? N  Do you have problems with loss of bowel control? N  Managing your Medications? N   Managing your Finances? N  Housekeeping or managing your Housekeeping? N  Some recent data might be hidden    Patient Care Team: McLean-Scocuzza, Nino Glow, MD as PCP - General (Internal Medicine) Stanford Breed Denice Bors, MD as PCP - Cardiology (Cardiology) Tanda Rockers, MD (Pulmonary Disease)    Assessment:   This is a routine wellness examination for [name] [name].  Nurse connected with patient 04/02/19 at 11:00 AM EDT by a telephone enabled telemedicine application and verified that I am speaking with the correct person using two identifiers. Patient stated full name and DOB. Patient gave permission to continue with virtual visit. Patient's location was at home and Nurse's location was at [place] office.   Patient is alert and oriented x3. Patient denies difficulty focusing or concentrating. Patient likes to read, completes word search and pays her own finances for brain health.   Health Maintenance Due: See completed HM at the end of note.   Eye: Visual acuity not assessed. Virtual visit. Followed by their ophthalmologist.  Dental: UTD  Hearing: Demonstrates normal hearing during visit.  Safety:  Patient feels safe at home- yes Patient does have smoke detectors at home- yes Patient does wear sunscreen or protective clothing when in direct sunlight - yes Patient does wear seat belt when in a moving vehicle - yes Patient drives- yes Adequate lighting in walkways free from debris- yes Grab bars and handrails used as appropriate- yes Ambulates with an assistive device- yes; walker/cane in use.  Cell phone on person when ambulating outside of the home- yes  Social: Alcohol intake - no   Smoking history- former   Smokers in home? none Illicit drug use? none  Medication: Taking as directed and without issues.  Pill box in use -yes  Self managed - yes   Covid-19: Precautions and sickness symptoms discussed. Wears mask, social distancing, hand hygiene as appropriate.    Activities of Daily Living Patient denies needing assistance with: household chores, feeding themselves, getting from bed to chair, getting to the toilet, bathing/showering, dressing, managing money, or preparing meals.   Discussed the importance of a healthy diet, water intake and the benefits of aerobic exercise.  Physical activity- limited. Oxygen in use daily 2.5-3L  Diet:  Regular Water: good intake Caffeine: 1 cup of coffee  Other Providers Patient Care Team: McLean-Scocuzza, Nino Glow, MD as PCP - General (Internal Medicine) Stanford Breed Denice Bors, MD as PCP - Cardiology (Cardiology) Tanda Rockers, MD (Pulmonary Disease)  Exercise Activities and Dietary recommendations Current Exercise Habits: The patient does not participate in regular exercise at present  Goals      Patient Stated   . Increase physical activity (pt-stated)     Walk for exercise as tolerated.       Fall Risk Fall Risk  04/02/2019 10/03/2018 03/30/2018 05/13/2017 01/19/2017  Falls in the past year? 1 0 0 No No  Number falls in past yr: 0 - - - -  Comment Fall 1 week ago while carrying groceries. Plans to follow up with physician. - - - -  Injury with Fall? 0 - - - -  Risk for fall due to : Impaired balance/gait - - - -  Follow  up Falls evaluation completed;Falls prevention discussed - - - -   Timed Get Up and Go performed: no, virtual visit  Depression Screen PHQ 2/9 Scores 04/02/2019 10/03/2018 03/30/2018 05/13/2017  PHQ - 2 Score 0 0 0 0  PHQ- 9 Score - - - -     Cognitive Function     6CIT Screen 04/02/2019 03/30/2018  What Year? 0 points 0 points  What month? 0 points 0 points  What time? 0 points 0 points  Count back from 20 0 points 0 points  Months in reverse 0 points 0 points  Repeat phrase - 0 points  Total Score - 0    Immunization History  Administered Date(s) Administered  . Fluad Quad(high Dose 65+) 09/08/2018  . Influenza Split 10/14/2010, 10/13/2011  . Influenza Whole 10/02/2007,  10/14/2008, 10/16/2009  . Influenza, High Dose Seasonal PF 10/09/2016, 10/27/2017  . Influenza, Seasonal, Injecte, Preservative Fre 11/01/2013  . Influenza,inj,Quad PF,6+ Mos 10/17/2012, 10/17/2015  . PFIZER SARS-COV-2 Vaccination 03/23/2019  . Pneumococcal Conjugate-13 10/17/2012, 01/03/2013, 09/06/2018  . Pneumococcal Polysaccharide-23 09/12/2006, 07/27/2016  . Td 12/05/2007  . Tdap 10/23/2018  . Zoster 10/14/2010  . Zoster Recombinat (Shingrix) 10/09/2018, 12/25/2018   Screening Tests Health Maintenance  Topic Date Due  . MAMMOGRAM  07/06/2020  . COLONOSCOPY  01/14/2026  . TETANUS/TDAP  10/22/2028  . INFLUENZA VACCINE  Completed  . DEXA SCAN  Completed  . Hepatitis C Screening  Completed  . PNA vac Low Risk Adult  Completed      Plan:   Keep all routine maintenance appointments.   Vitals reported taken at 10:00 a.m. Following up with Cardiology, Kirk Ruths @ 1:45. Reports a fall from 1 week ago and has been monitoring BP daily, fluctuating. Denies headache, chest pain and all other symptoms.   Follow up 04/03/19 @ 10:30  Medicare Attestation I have personally reviewed: The patient's medical and social history Their use of alcohol, tobacco or illicit drugs Their current medications and supplements The patient's functional ability including ADLs,fall risks, home safety risks, cognitive, and hearing and visual impairment Diet and physical activities Evidence for depression    I have reviewed and discussed with patient certain preventive protocols, quality metrics, and best practice recommendations.      Varney Biles, LPN  3/72/9021   Agree [place] Agree with plan and was in the office 04/02/19.  Mable Paris, NP

## 2019-04-02 NOTE — Telephone Encounter (Signed)
PAOV Kourtland Coopman  

## 2019-04-02 NOTE — Patient Instructions (Addendum)
  [name] [name] , Thank you for taking time to come for your Medicare Wellness Visit. I appreciate your ongoing commitment to your health goals. Please review the following plan we discussed and let me know if I can assist you in the future.   These are the goals we discussed: Goals      Patient Stated   . Increase physical activity (pt-stated)     Walk for exercise as tolerated.       This is a list of the screening recommended for you and due dates:  Health Maintenance  Topic Date Due  . Mammogram  07/06/2020  . Colon Cancer Screening  01/14/2026  . Tetanus Vaccine  10/22/2028  . Flu Shot  Completed  . DEXA scan (bone density measurement)  Completed  .  Hepatitis C: One time screening is recommended by Center for Disease Control  (CDC) for  adults born from [birth year] through 1965.   Completed  . Pneumonia vaccines  Completed

## 2019-04-02 NOTE — Telephone Encounter (Signed)
Spoke with pt, Follow up scheduled  

## 2019-04-02 NOTE — Progress Notes (Signed)
Cardiology Office Note  Date: 3/47/4259   ID: [name] [name], DOB [birth year], MRN [id number]  PCP:  McLean-Scocuzza, Nino Glow, MD  Cardiologist:  Kirk Ruths, MD Electrophysiologist:  None   Chief Complaint: Paroxysmal atrial fibrillation/flutter flutter,  History of Present Illness: [name] [name] is a 69 y.o. female with a history of A. fib/flutter, DD, COPD on continuous O2, lower extremity edema, morbid obesity, chronic Coumadin use.  Previous cardiac cath 2003 normal.  Nuclear stress study 2015 EF 61% and normal perfusion.  Seen in 2016 by Dr. Martinique and noted to be in atrial flutter.  Had a subsequent TEE cardioversion November 2016.  TEE = normal LV function, mild MR, mild biatrial enlargement.  Atrial flutter ablation 2018.  Recurrent atrial fibrillation 2018 requiring cardioversion.  Echo July 2020 normal LV function, moderate LVH, grade 2 DD, severe LAE, mild AS with mean gradient 11 mmHg, trace AI.  Last visit with Dr. Stanford Breed 12/28/2018: He ordered continuation of beta-blocker, propafenone, and apixaban.  Amlodipine 5 mg was added for better blood pressure control.  Labs were ordered to check hemoglobin, renal function, potassium, lipids, and liver function test.  Weight loss was encouraged. Mention of TAVR in the event her aortic stenosis becomes worse.  Patient had a recent fall and now stating she feels like she has gone back into atrial fibrillation and feeling palpitations during a phone call with Rosaria Ferries, PA-C on 03/31/2019.  She stated her home blood pressure monitor  heart rate had been high at times.  [name] PA, increased her metoprolol to 50 mg twice a day.  Patient states since her fall 1 week ago she been having increasing palpitations, fluttering sensations in her chest with increased dyspnea on exertion.  States she is more fatigued since the palpitations started.  She states she believes that she had converted back into atrial fibrillation.  She has  not recently missed any dosages of Eliquis.  Apparently she misunderstood that she needed to be taking 50 mg of metoprolol per day for rate control.  She states she has only been taking 25 twice a day.   Past Medical History:  Diagnosis Date  . Anemia    hx  . Anxiety   . Arthritis   . Asthma   . Atrial fibrillation and flutter ([place])   . Atypical lobular hyperplasia of right breast 05/09/2015  . Breast CA ([place])    ?  Marland Kitchen Chronic rhinitis   . COPD (chronic obstructive pulmonary disease) ([place])    [place]. PFTs 07/25/06 FEV1 55% ratio 53, DLC0 51% HFA 50% 12/16/2009  . Depression   . FRACTURE, RIB, RIGHT 06/18/2009  . Glucose intolerance (impaired glucose tolerance)    on steroids  . History of DVT (deep vein thrombosis)   . Hx of cardiac catheterization    LHC (01/2001):  Normal Cors.  EF 65%.;  LexiScan Myoview (03/2013): No ischemia, EF 61%, normal  . Hx of echocardiogram    Echo (11/2011):  Mod LVH, EF 60-65%, no RWMA, MAC, mild LAE, PASP 34.  Marland Kitchen HYPERLIPIDEMIA   . Hypertension   . Hypothyroidism   . Morbid obesity ([place])    target wt=179lb for BMI<30 Peak wt 282lb  . Osteoporosis    last BMD 4/08 -2.4, intolerant of bisphosphonates  . Peripheral vascular disease (HCC)    hx dvt.pe  . Pneumonia    hx  . PONV (postoperative nausea and vomiting)   . PREMATURE VENTRICULAR CONTRACTIONS   . PULMONARY  EMBOLISM, HX OF 06/19/2007  . Recurrent upper respiratory infection (URI)   . Rhabdomyolysis 07/29/2009  . Tremor   . VITAMIN D DEFICIENCY     Past Surgical History:  Procedure Laterality Date  . A-FLUTTER ABLATION N/A 10/08/2016   Procedure: A-FLUTTER ABLATION;  Surgeon: Evans Lance, MD;  Location: [facility];  Service: Cardiovascular;  Laterality: N/A;  . ABDOMINAL HYSTERECTOMY    . BREAST LUMPECTOMY WITH RADIOACTIVE SEED LOCALIZATION Right 06/05/2015   Procedure: RIGHT BREAST LUMPECTOMY WITH RADIOACTIVE SEED LOCALIZATION;  Surgeon: Fanny Skates, MD;  Location: [facility];   Service: General;  Laterality: Right;  . BREAST SURGERY Left    s/p mass removal  . CARDIOVERSION N/A 12/11/2014   Procedure: CARDIOVERSION;  Surgeon: Josue Hector, MD;  Location: [facility];  Service: Cardiovascular;  Laterality: N/A;  . CARDIOVERSION N/A 09/15/2016   Procedure: CARDIOVERSION;  Surgeon: Jerline Pain, MD;  Location: [facility] ENDOSCOPY;  Service: Cardiovascular;  Laterality: N/A;  . COLONOSCOPY  10/22/2003, ?2006  . COLONOSCOPY WITH PROPOFOL N/A 01/15/2016   Procedure: COLONOSCOPY WITH PROPOFOL;  Surgeon: Gatha Mayer, MD;  Location: [facility] ENDOSCOPY;  Service: Endoscopy;  Laterality: N/A;  . ESOPHAGOGASTRODUODENOSCOPY (EGD) WITH PROPOFOL N/A 01/15/2016   Procedure: ESOPHAGOGASTRODUODENOSCOPY (EGD) WITH PROPOFOL;  Surgeon: Gatha Mayer, MD;  Location: [facility]OSCOPY;  Service: Endoscopy;  Laterality: N/A;  . s/p left arm fracture with fall off chair    . skin graft to middle R finger  1975  . TEE WITHOUT CARDIOVERSION N/A 12/11/2014   Procedure: TRANSESOPHAGEAL ECHOCARDIOGRAM (TEE);  Surgeon: Josue Hector, MD;  Location: [facility] ENDOSCOPY;  Service: Cardiovascular;  Laterality: N/A;  . TONSILLECTOMY      Current Outpatient Medications  Medication Sig Dispense Refill  . amLODipine (NORVASC) 5 MG tablet Take 1 tablet (5 mg total) by mouth daily. 180 tablet 3  . atorvastatin (LIPITOR) 10 MG tablet Take 1 tablet (10 mg total) by mouth daily at 6 PM. 90 tablet 3  . azelastine (ASTELIN) 0.1 % nasal spray Place 2 sprays into both nostrils at bedtime as needed for rhinitis. Use in each nostril as directed 90 mL 3  . benzonatate (TESSALON) 200 MG capsule Take 1 capsule (200 mg total) by mouth 3 (three) times daily as needed for cough. 30 capsule 5  . Calcium Carbonate-Vitamin D 600-400 MG-UNIT per tablet Take 1 tablet by mouth daily.     Marland Kitchen ELIQUIS 5 MG TABS tablet TAKE 1 TABLET BY MOUTH  TWICE A DAY 180 tablet 1  . famotidine (PEPCID) 20 MG tablet Take 1 tablet (20 mg total) by mouth daily. At  bedtime 90 tablet 3  . ferrous sulfate 325 (65 FE) MG tablet Take 1 tablet (325 mg total) by mouth 2 (two) times daily with a meal. With lunch and dinner 180 tablet 3  . FLUoxetine (PROZAC) 40 MG capsule TAKE 1 CAPSULE BY MOUTH  DAILY 90 capsule 3  . Fluticasone-Umeclidin-Vilant (TRELEGY ELLIPTA) 100-62.5-25 MCG/INH AEPB Inhale 1 puff into the lungs daily. 180 each 3  . furosemide (LASIX) 80 MG tablet TAKE 1 TABLET BY MOUTH IN  THE MORNING AND 1 TABLET IN THE AFTERNOON 180 tablet 3  . glucosamine-chondroitin 500-400 MG tablet Take 1 tablet by mouth every morning.     Marland Kitchen KLOR-CON M20 20 MEQ tablet TAKE 1 TABLET BY MOUTH  TWICE DAILY 180 tablet 3  . levalbuterol (XOPENEX) 1.25 MG/3ML nebulizer solution Take 1.25 mg by nebulization every 4 (four) hours. (Patient taking  differently: Take 1.25 mg by nebulization every 4 (four) hours as needed for wheezing or shortness of breath. ) 360 mL 11  . levocetirizine (XYZAL) 5 MG tablet Take 1 tablet (5 mg total) by mouth daily as needed for allergies. 90 tablet 3  . levothyroxine (SYNTHROID) 50 MCG tablet Take 50 mcg by mouth daily.    . metoprolol tartrate (LOPRESSOR) 25 MG tablet Take 1 tablet (25 mg total) by mouth 2 (two) times daily. 180 tablet 3  . olopatadine (PATANOL) 0.1 % ophthalmic solution Place 1 drop into both eyes 2 (two) times daily. 15 mL 4  . OXYGEN Inhale 2.5 L/min into the lungs continuous. And 4 with exertion    . pantoprazole (PROTONIX) 40 MG tablet TAKE 1 TABLET BY MOUTH  DAILY. TAKE 30-60 MINUTES  BEFORE FIRST MEAL OF THE  DAY 90 tablet 0  . propafenone (RYTHMOL) 150 MG tablet Take 1 tablet (150 mg total) by mouth every 8 (eight) hours. 270 tablet 2  . sodium chloride (OCEAN) 0.65 % SOLN nasal spray Place 2 sprays every 4 (four) hours as needed into both nostrils for congestion.     No current facility-administered medications for this visit.   Allergies:  Duloxetine, Penicillins, and Latex   Social History: The patient  reports that  she quit smoking about 13 years ago. Her smoking use included cigarettes. She has a 35.00 pack-year smoking history. She has never used smokeless tobacco. She reports that she does not drink alcohol or use drugs.   Family History: The patient's family history includes Asthma in her brother and son; Esophageal cancer in her brother; Heart disease in her mother; Liver disease in her mother.   ROS:  Please see the history of present illness. Otherwise, complete review of systems is positive for none.  All other systems are reviewed and negative.   Physical Exam: VS:  There were no vitals taken for this visit., BMI There is no height or weight on file to calculate BMI.  Wt Readings from Last 3 Encounters:  04/02/19 246 lb (111.6 kg)  12/28/18 246 lb (111.6 kg)  12/13/18 246 lb 6.4 oz (111.8 kg)    General: Patient appears comfortable at rest. Neck: Supple, no elevated JVP or carotid bruits, no thyromegaly. Lungs: Clear to auscultation, nonlabored breathing at rest. Cardiac: Irregularly irregular and tachycardic , no S3 or significant systolic murmur, no pericardial rub. Extremities: No pitting edema, distal pulses 2+. Skin: Warm and dry. Musculoskeletal: No kyphosis. Neuropsychiatric: Alert and oriented x3, affect grossly appropriate.  ECG:  An ECG dated 04/02/2019 was personally reviewed today and demonstrated:  Atrial fibrillation with RVR rate of 122, nonspecific ST and T wave abnormality  Recent Labwork: 12/28/2018: BUN 9; Creatinine, Ser 0.77; Hemoglobin 11.4; Platelets 272; Potassium 4.0; Sodium 145     Component Value Date/Time   CHOL 152 12/28/2018 1107   TRIG 102 12/28/2018 1107   HDL 40 12/28/2018 1107   CHOLHDL 3.8 12/28/2018 1107   CHOLHDL 4 03/09/2018 0949   VLDL 22.2 03/09/2018 0949   LDLCALC 93 12/28/2018 1107   LDLDIRECT 140.2 04/10/2010 0836    Other Studies Reviewed Today:  Echocardiogram 07/27/2018 1. The left ventricle has normal systolic function with an  ejection  fraction of 60-65%. The cavity size was normal. There is moderately  increased left ventricular wall thickness. Left ventricular diastolic  Doppler parameters are consistent with pseudonormalization. Elevated left ventricular end-diastolic pressure The E/e' is >20.  2. The right ventricle has normal systolic  function. The cavity was  normal. There is no increase in right ventricular wall thickness. Right  ventricular systolic pressure is mildly elevated with an estimated  pressure of 32.2 mmHg.  3. Left atrial size was severely dilated.  4. The aortic valve is abnormal. Mild calcification of the aortic valve.  Aortic valve regurgitation is trivial by color flow Doppler. Mild stenosis  of the aortic valve. Mean gradient 11 mmHg. Difference in mean gradients between exams may be secondary to increased flow velocities across LVOT on prior study.  5. The mitral valve is abnormal. There is moderate to severe mitral  annular calcification present. Mean diastolic gradient 3 mmHg at HR 64  bpm.   Atrial flutter ablation 10/08/2016 CONCLUSIONS:  1. Isthmus-dependent right atrial flutter upon presentation.  2. Successful radiofrequency ablation of atrial flutter along the cavotricuspid isthmus with complete bidirectional isthmus block achieved.  3. No inducible arrhythmias following ablation.  4. No early apparent complications.    Assessment and Plan:  1. PAF (paroxysmal atrial fibrillation) ([place])   2. Moderate aortic stenosis   3. Chronic diastolic CHF (congestive heart failure) ([place])   4. Essential hypertension   5. Mixed hyperlipidemia   6. Morbid obesity due to excess calories ([place])    1. PAF (paroxysmal atrial fibrillation) ([place]) She had a recent fall and noticed she was having palpitations and believes she is back in atrial fibrillation during recent phone call with Rosaria Ferries, PA.  Discussed case with Dr. Stanford Breed.  Schedule for elective DC cardioversion.  Hold Lasix  day of procedure.  N.p.o. after midnight night before procedure.   Increase metoprolol to 50 mg twice a day.  Patient had misunderstood that she needed to increase her dose of metoprolol after recent phone call with Rosaria Ferries, PA C.  She has only been taking 25 mg p.o. twice daily.  Continue Eliquis 5 mg p.o. twice daily.  Continue Rythmol 150 mg p.o. every 8 hours.  Basic metabolic panel and CBC  2. Moderate aortic stenosis Recent echocardiogram 07/18/2018 showed mild stenosis of the aortic valve. Mean gradient 11 mmHg.  Consider possible TAVR in the future if aortic stenosis worsens.  3. Chronic diastolic CHF (congestive heart failure) (HCC) Elevated LVEDP, left ventricular diastolic parameters consistent with pseudonormalization.  E/e' > than 20.  Continue metoprolol 50 mg p.o. twice daily, Lasix 80 mg p.o. twice daily  4. Essential hypertension Blood pressures well controlled today at 110/68 on current therapy.  5. Mixed hyperlipidemia LDL in December 2020 = 93.  Continue Lipitor 10 mg daily.   6. Morbid obesity due to excess calories [facility]) Patient states she had lost about 18 pounds but has regained most of that weight back.  Encouraged dietary and lifestyle changes along with weight loss.  Medication Adjustments/Labs and Tests Ordered: Current medicines are reviewed at length with the patient today.  Concerns regarding medicines are outlined above.   Disposition: Follow-up with Dr. Stanford Breed or APP  Signed, Levell July, NP  04/02/2019 1:22 PM    [facility]

## 2019-04-02 NOTE — Patient Instructions (Addendum)
Medication Instructions:   INCREASE METOPROLOL TARTRATE TO 50 MG 2 TIMES A DAY  *If you need a refill on your cardiac medications before your next appointment, please call your pharmacy*  Lab Work: Your physician recommends that you return for lab work [place]:  BMET   CBC  If you have labs (blood work) drawn today and your tests are completely normal, you will receive your results only by: Marland Kitchen MyChart Message (if you have MyChart) OR . A paper copy in the mail If you have any lab test that is abnormal or we need to change your treatment, we will call you to review the results.   Testing/Procedures: Your physician has recommended that you have a Cardioversion (DCCV). Electrical Cardioversion uses a jolt of electricity to your heart either through paddles or wired patches attached to your chest. This is a controlled, usually prescheduled, procedure. Defibrillation is done under light anesthesia in the hospital, and you usually go home the day of the procedure. This is done to get your heart back into a normal rhythm. You are not awake for the procedure. Please see the instruction sheet given to you today.   Electrical Cardioversion Electrical cardioversion is the delivery of a jolt of electricity to restore a normal rhythm to the heart. A rhythm that is too fast or is not regular keeps the heart from pumping well. In this procedure, sticky patches or metal paddles are placed on the chest to deliver electricity to the heart from a device. This procedure may be done in an emergency if:  There is low or no blood pressure as a result of the heart rhythm.  Normal rhythm must be restored as fast as possible to protect the brain and heart from further damage.  It may save a life. This may also be a scheduled procedure for irregular or fast heart rhythms that are not immediately life-threatening. Tell a health care provider about:  Any allergies you have.  All medicines  you are taking, including vitamins, herbs, eye drops, creams, and over-the-counter medicines.  Any problems you or family members have had with anesthetic medicines.  Any blood disorders you have.  Any surgeries you have had.  Any medical conditions you have.  Whether you are pregnant or may be pregnant. What are the risks? Generally, this is a safe procedure. However, problems may occur, including:  Allergic reactions to medicines.  A blood clot that breaks free and travels to other parts of your body.  The possible return of an abnormal heart rhythm within hours or days after the procedure.  Your heart stopping (cardiac arrest). This is rare. What happens before the procedure? Medicines  Your health care provider may have you start taking: ? Blood-thinning medicines (anticoagulants) so your blood does not clot as easily. ? Medicines to help stabilize your heart rate and rhythm.  Ask your health care provider about: ? Changing or stopping your regular medicines. This is especially important if you are taking diabetes medicines or blood thinners. ? Taking medicines such as aspirin and ibuprofen. These medicines can thin your blood. Do not take these medicines unless your health care provider tells you to take them. ? Taking over-the-counter medicines, vitamins, herbs, and supplements. General instructions  Follow instructions from your health care provider about eating or drinking restrictions.  Plan to have someone take you home from the hospital or clinic.  If you will be going home right after the procedure, plan to have someone with  you for 24 hours.  Ask your health care provider what steps will be taken to help prevent infection. These may include washing your skin with a germ-killing soap. What happens during the procedure?   An IV will be inserted into one of your veins.  Sticky patches (electrodes) or metal paddles may be placed on your chest.  You will be  given a medicine to help you relax (sedative).  An electrical shock will be delivered. The procedure may vary among health care providers and hospitals. What can I expect after the procedure?  Your blood pressure, heart rate, breathing rate, and blood oxygen level will be monitored until you leave the hospital or clinic.  Your heart rhythm will be watched to make sure it does not change.  You may have some redness on the skin where the shocks were given. Follow these instructions at home:  Do not drive for 24 hours if you were given a sedative during your procedure.  Take over-the-counter and prescription medicines only as told by your health care provider.  Ask your health care provider how to check your pulse. Check it often.  Rest for 48 hours after the procedure or as told by your health care provider.  Avoid or limit your caffeine use as told by your health care provider.  Keep all follow-up visits as told by your health care provider. This is important. Contact a health care provider if:  You feel like your heart is beating too quickly or your pulse is not regular.  You have a serious muscle cramp that does not go away. Get help right away if:  You have discomfort in your chest.  You are dizzy or you feel faint.  You have trouble breathing or you are short of breath.  Your speech is slurred.  You have trouble moving an arm or leg on one side of your body.  Your fingers or toes turn cold or blue. Summary  Electrical cardioversion is the delivery of a jolt of electricity to restore a normal rhythm to the heart.  This procedure may be done right away in an emergency or may be a scheduled procedure if the condition is not an emergency.  Generally, this is a safe procedure.  After the procedure, check your pulse often as told by your health care provider. This information is not intended to replace advice given to you by your health care provider. Make sure you  discuss any questions you have with your health care provider. Document Revised: 07/31/2018 Document Reviewed: 07/31/2018 Elsevier Patient Education  [place]: At [facility], you and your health needs are our priority.  As part of our continuing mission to provide you with exceptional heart care, we have created designated Provider Care Teams.  These Care Teams include your primary Cardiologist (physician) and Advanced Practice Providers (APPs -  Physician Assistants and Nurse Practitioners) who all work together to provide you with the care you need, when you need it.  We recommend signing up for the patient portal called "MyChart".  Sign up information is provided on this After Visit Summary.  MyChart is used to connect with patients for Virtual Visits (Telemedicine).  Patients are able to view lab/test results, encounter notes, upcoming appointments, etc.  Non-urgent messages can be sent to your provider as well.   To learn more about what you can do with MyChart, go to [email or web address].    Your next appointment:   1 month(s)  The  format for your next appointment:   In Person  Provider:   Kirk Ruths, MD  Other Instructions Dear [name] [name] You are scheduled for a TEE/Cardioversion/TEE Cardioversion on Monday 04/09/2019 with Dr. Lyman Bishop.  Please arrive at the [facility] (Main Entrance A) at [facility]: [address] [place], [place] [address] at 10:00 AM. (1 hour prior to procedure unless lab work is needed; if lab work is needed arrive 1.5 hours ahead)  DIET: Nothing to eat or drink after midnight except a sip of water with medications (see medication instructions below)  Medication Instructions: Hold Lasix the night before and morning of procedure  Continue your anticoagulant: ELIQUIS You will need to continue your anticoagulant after your procedure until you  are told by your  Provider that it is safe to stop   Labs: If  patient is on Coumadin, patient needs pt/INR, CBC, BMET within 3 days (No pt/INR needed for patients taking Xarelto, Eliquis, Pradaxa) For patients receiving anesthesia for TEE and all Cardioversion patients: BMET, CBC within 1 week  Come to THE LAB AT [place] [address]  You must have a responsible person to drive you home and stay in the waiting area during your procedure. Failure to do so could result in cancellation.  Bring your insurance cards.  *Special Note: Every effort is made to have your procedure done on time. Occasionally there are emergencies that occur at the hospital that may cause delays. Please be patient if a delay does occur.

## 2019-04-03 ENCOUNTER — Ambulatory Visit: Payer: Medicare Other

## 2019-04-03 ENCOUNTER — Encounter: Payer: Self-pay | Admitting: Internal Medicine

## 2019-04-03 ENCOUNTER — Ambulatory Visit (INDEPENDENT_AMBULATORY_CARE_PROVIDER_SITE_OTHER): Payer: Medicare Other

## 2019-04-03 ENCOUNTER — Other Ambulatory Visit: Payer: Self-pay

## 2019-04-03 ENCOUNTER — Ambulatory Visit (INDEPENDENT_AMBULATORY_CARE_PROVIDER_SITE_OTHER): Payer: Medicare Other | Admitting: Internal Medicine

## 2019-04-03 VITALS — BP 124/84 | HR 124 | Temp 96.6°F | Ht 62.0 in | Wt 251.6 lb

## 2019-04-03 DIAGNOSIS — I4891 Unspecified atrial fibrillation: Secondary | ICD-10-CM

## 2019-04-03 DIAGNOSIS — W19XXXA Unspecified fall, initial encounter: Secondary | ICD-10-CM

## 2019-04-03 DIAGNOSIS — R7989 Other specified abnormal findings of blood chemistry: Secondary | ICD-10-CM | POA: Diagnosis not present

## 2019-04-03 DIAGNOSIS — N3000 Acute cystitis without hematuria: Secondary | ICD-10-CM | POA: Diagnosis not present

## 2019-04-03 DIAGNOSIS — R079 Chest pain, unspecified: Secondary | ICD-10-CM | POA: Diagnosis not present

## 2019-04-03 DIAGNOSIS — J449 Chronic obstructive pulmonary disease, unspecified: Secondary | ICD-10-CM

## 2019-04-03 DIAGNOSIS — Z1231 Encounter for screening mammogram for malignant neoplasm of breast: Secondary | ICD-10-CM

## 2019-04-03 DIAGNOSIS — M8580 Other specified disorders of bone density and structure, unspecified site: Secondary | ICD-10-CM

## 2019-04-03 DIAGNOSIS — E2839 Other primary ovarian failure: Secondary | ICD-10-CM

## 2019-04-03 LAB — BASIC METABOLIC PANEL
BUN/Creatinine Ratio: 16 (ref 12–28)
BUN: 17 mg/dL (ref 8–27)
CO2: 30 mmol/L — ABNORMAL HIGH (ref 20–29)
Calcium: 9.5 mg/dL (ref 8.7–10.3)
Chloride: 100 mmol/L (ref 96–106)
Creatinine, Ser: 1.07 mg/dL — ABNORMAL HIGH (ref 0.57–1.00)
GFR calc Af Amer: 62 mL/min/{1.73_m2} (ref >59)
GFR calc non Af Amer: 53 mL/min/{1.73_m2} — ABNORMAL LOW (ref >59)
Glucose: 91 mg/dL (ref 65–99)
Potassium: 4.4 mmol/L (ref 3.5–5.2)
Sodium: 144 mmol/L (ref 134–144)

## 2019-04-03 LAB — CBC
Hematocrit: 34.5 % (ref 34.0–46.6)
Hemoglobin: 11.1 g/dL (ref 11.1–15.9)
MCH: 28.3 pg (ref 26.6–33.0)
MCHC: 32.2 g/dL (ref 31.5–35.7)
MCV: 88 fL (ref 79–97)
Platelets: 296 10*3/uL (ref 150–450)
RBC: 3.92 x10E6/uL (ref 3.77–5.28)
RDW: 15.7 % — ABNORMAL HIGH (ref 11.7–15.4)
WBC: 9 10*3/uL (ref 3.4–10.8)

## 2019-04-03 LAB — TROPONIN I (HIGH SENSITIVITY): High Sens Troponin I: 5 ng/L (ref 2–17)

## 2019-04-03 LAB — TSH: TSH: 2.95 u[IU]/mL (ref 0.35–4.50)

## 2019-04-03 NOTE — Patient Instructions (Signed)

## 2019-04-03 NOTE — Progress Notes (Signed)
Chief Complaint  Patient presents with  . Follow-up  . Chest Pain    Chest tightness and difficulty breathing. Pt is on oxygen.    F/u with husband  1. Having chest tightness with Afib and HR ranging 90s -130s today 124 after increase metoprolol 04/02/19 50 mg bid DCCV 3/29 and covid testing pending 04/05/19   2. COPD on 2.5 L cont O2 machine defective on trelegy, xopenex will sch pulm upcoming  O2 machine portable defective and this is 2nd or 3rd machine keeps cutting off  Gets o2 from [employer]   3. Fall 3 weeks ago mechanical carrying groceries in the house   4. Hypothyroidism on levo 50 mcg qd last TSH elevated   Review of Systems  Constitutional: Negative for weight loss.  HENT: Negative for hearing loss.   Eyes: Negative for blurred vision.  Respiratory: Negative for shortness of breath.   Cardiovascular: Positive for chest pain and palpitations.  Skin: Negative for rash.   Past Medical History:  Diagnosis Date  . Anemia    hx  . Anxiety   . Arthritis   . Asthma   . Atrial fibrillation and flutter ([place])   . Atypical lobular hyperplasia of right breast 05/09/2015  . Breast CA ([place])    ?  Marland Kitchen Chronic rhinitis   . COPD (chronic obstructive pulmonary disease) ([place])    [place]. PFTs 07/25/06 FEV1 55% ratio 53, DLC0 51% HFA 50% 12/16/2009  . Depression   . FRACTURE, RIB, RIGHT 06/18/2009  . Glucose intolerance (impaired glucose tolerance)    on steroids  . History of DVT (deep vein thrombosis)   . Hx of cardiac catheterization    LHC (01/2001):  Normal Cors.  EF 65%.;  LexiScan Myoview (03/2013): No ischemia, EF 61%, normal  . Hx of echocardiogram    Echo (11/2011):  Mod LVH, EF 60-65%, no RWMA, MAC, mild LAE, PASP 34.  Marland Kitchen HYPERLIPIDEMIA   . Hypertension   . Hypothyroidism   . Morbid obesity ([place])    target wt=179lb for BMI<30 Peak wt 282lb  . Osteoporosis    last BMD 4/08 -2.4, intolerant of bisphosphonates  . Peripheral vascular disease (H[facility])    hx dvt.pe  . Pneumonia    hx   . PONV (postoperative nausea and vomiting)   . PREMATURE VENTRICULAR CONTRACTIONS   . PULMONARY EMBOLISM, HX OF 06/19/2007  . Recurrent upper respiratory infection (URI)   . Rhabdomyolysis 07/29/2009  . Tremor   . VITAMIN D DEFICIENCY    Past Surgical History:  Procedure Laterality Date  . A-FLUTTER ABLATION N/A 10/08/2016   Procedure: A-FLUTTER ABLATION;  Surgeon: Evans Lance, MD;  Location: [facility];  Service: Cardiovascular;  Laterality: N/A;  . ABDOMINAL HYSTERECTOMY    . BREAST LUMPECTOMY WITH RADIOACTIVE SEED LOCALIZATION Right 06/05/2015   Procedure: RIGHT BREAST LUMPECTOMY WITH RADIOACTIVE SEED LOCALIZATION;  Surgeon: Fanny Skates, MD;  Location: [facility];  Service: General;  Laterality: Right;  . BREAST SURGERY Left    s/p mass removal  . CARDIOVERSION N/A 12/11/2014   Procedure: CARDIOVERSION;  Surgeon: Josue Hector, MD;  Location: [facility];  Service: Cardiovascular;  Laterality: N/A;  . CARDIOVERSION N/A 09/15/2016   Procedure: CARDIOVERSION;  Surgeon: Jerline Pain, MD;  Location: [facility] ENDOSCOPY;  Service: Cardiovascular;  Laterality: N/A;  . COLONOSCOPY  10/22/2003, ?2006  . COLONOSCOPY WITH PROPOFOL N/A 01/15/2016   Procedure: COLONOSCOPY WITH PROPOFOL;  Surgeon: Gatha Mayer, MD;  Location: [facility] ENDOSCOPY;  Service:  Endoscopy;  Laterality: N/A;  . ESOPHAGOGASTRODUODENOSCOPY (EGD) WITH PROPOFOL N/A 01/15/2016   Procedure: ESOPHAGOGASTRODUODENOSCOPY (EGD) WITH PROPOFOL;  Surgeon: Gatha Mayer, MD;  Location: [facility] ENDOSCOPY;  Service: Endoscopy;  Laterality: N/A;  . s/p left arm fracture with fall off chair    . skin graft to middle R finger  1975  . TEE WITHOUT CARDIOVERSION N/A 12/11/2014   Procedure: TRANSESOPHAGEAL ECHOCARDIOGRAM (TEE);  Surgeon: Josue Hector, MD;  Location: [facility] ENDOSCOPY;  Service: Cardiovascular;  Laterality: N/A;  . TONSILLECTOMY     Family History  Problem Relation Age of Onset  . Heart disease Mother   . Liver disease Mother         alcohol related  . Asthma Brother   . Esophageal cancer Brother   . Asthma Son   . Colon cancer Neg Hx   . Stomach cancer Neg Hx   . Pancreatic cancer Neg Hx   . Inflammatory bowel disease Neg Hx   . Allergic rhinitis Neg Hx   . Eczema Neg Hx   . Urticaria Neg Hx    Social History   Socioeconomic History  . Marital status: Married    Spouse name: Not on file  . Number of children: 3  . Years of education: Not on file  . Highest education level: Not on file  Occupational History  . 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 Smoking status: Former Smoker    Packs/day: 1.00    Years: 35.00    Pack years: 35.00    Types: Cigarettes    Quit date: 01/11/2006    Years since quitting: 13.2  . Smokeless tobacco: Never Used  Substance and Sexual Activity  . Alcohol use: No    Alcohol/week: 0.0 standard drinks  . Drug use: No  . Sexual activity: Yes  Other Topics Concern  . Not on file  Social History Narrative   Married 1 son 2 daughters   Disabled   2 caffeine/day   Past smoker   11/12/2015   Social Determinants of Health   Financial Resource Strain:   . Difficulty of Paying Living Expenses:   Food Insecurity:   . Worried About Charity fundraiser in the Last Year:   . Arboriculturist in the Last Year:   Transportation Needs:   . Film/video editor (Medical):   Marland Kitchen Lack of Transportation (Non-Medical):   Physical Activity:   . Days of Exercise per Week:   . Minutes of Exercise per Session:   Stress:   . Feeling of Stress :   Social Connections:   . Frequency of Communication with Friends and Family:   . Frequency of Social Gatherings with Friends and Family:   . Attends Religious Services:   . Active Member of Clubs or Organizations:   . Attends Archivist Meetings:   Marland Kitchen Marital Status:   Intimate Partner Violence:   . Fear of Current or Ex-Partner:   . Emotionally Abused:   Marland Kitchen Physically Abused:    . Sexually Abused:    Current Meds  Medication Sig  . amLODipine (NORVASC) 5 MG tablet Take 1 tablet (5 mg total) by mouth daily.  Marland Kitchen atorvastatin (LIPITOR) 10 MG tablet Take 1 tablet (10 mg total) by mouth daily at 6 PM.  . azelastine (ASTELIN) 0.1 % nasal spray Place 2 sprays into both nostrils at bedtime as needed for rhinitis. Use in each nostril as  directed  . benzonatate (TESSALON) 200 MG capsule Take 1 capsule (200 mg total) by mouth 3 (three) times daily as needed for cough.  . Calcium Carbonate-Vitamin D 600-400 MG-UNIT per tablet Take 1 tablet by mouth daily.   Marland Kitchen ELIQUIS 5 MG TABS tablet TAKE 1 TABLET BY MOUTH  TWICE A DAY  . famotidine (PEPCID) 20 MG tablet Take 1 tablet (20 mg total) by mouth daily. At bedtime  . ferrous sulfate 325 (65 FE) MG tablet Take 1 tablet (325 mg total) by mouth 2 (two) times daily with a meal. With lunch and dinner  . FLUoxetine (PROZAC) 40 MG capsule TAKE 1 CAPSULE BY MOUTH  DAILY  . Fluticasone-Umeclidin-Vilant (TRELEGY ELLIPTA) 100-62.5-25 MCG/INH AEPB Inhale 1 puff into the lungs daily.  . furosemide (LASIX) 80 MG tablet TAKE 1 TABLET BY MOUTH IN  THE MORNING AND 1 TABLET IN THE AFTERNOON  . glucosamine-chondroitin 500-400 MG tablet Take 1 tablet by mouth every morning.   Marland Kitchen KLOR-CON M20 20 MEQ tablet TAKE 1 TABLET BY MOUTH  TWICE DAILY  . levalbuterol (XOPENEX) 1.25 MG/3ML nebulizer solution Take 1.25 mg by nebulization every 4 (four) hours. (Patient taking differently: Take 1.25 mg by nebulization every 4 (four) hours as needed for wheezing or shortness of breath. )  . levocetirizine (XYZAL) 5 MG tablet Take 1 tablet (5 mg total) by mouth daily as needed for allergies.  Marland Kitchen levothyroxine (SYNTHROID) 50 MCG tablet Take 50 mcg by mouth daily.  . metoprolol tartrate (LOPRESSOR) 50 MG tablet Take 1 tablet (50 mg total) by mouth 2 (two) times daily.  Marland Kitchen olopatadine (PATANOL) 0.1 % ophthalmic solution Place 1 drop into both eyes 2 (two) times daily.  . OXYGEN  Inhale 2.5 L/min into the lungs continuous. And 4 with exertion  . pantoprazole (PROTONIX) 40 MG tablet TAKE 1 TABLET BY MOUTH  DAILY. TAKE 30-60 MINUTES  BEFORE FIRST MEAL OF THE  DAY  . propafenone (RYTHMOL) 150 MG tablet Take 1 tablet (150 mg total) by mouth every 8 (eight) hours.  . sodium chloride (OCEAN) 0.65 % SOLN nasal spray Place 2 sprays every 4 (four) hours as needed into both nostrils for congestion.   Allergies  Allergen Reactions  . Duloxetine Other (See Comments)    REACTION: rhabdomyolysis  . Penicillins Other (See Comments)    SYNCOPE Has patient had a PCN reaction causing immediate rash, facial/tongue/throat swelling, SOB or lightheadedness with hypotension: Yes Has patient had a PCN reaction causing severe rash involving mucus membranes or skin necrosis: No Has patient had a PCN reaction that required hospitalization No Has patient had a PCN reaction occurring within the last 10 years: No If all of the above answers are "NO", then may proceed with Cephalosporin use.   . Latex Rash   Recent Results (from the past 2160 hour(s))  Basic metabolic panel     Status: Abnormal   Collection Time: 04/02/19  3:17 PM  Result Value Ref Range   Glucose 91 65 - 99 mg/dL   BUN 17 8 - 27 mg/dL   Creatinine, Ser 1.07 (H) 0.57 - 1.00 mg/dL   GFR calc non Af Amer 53 (L) >59 mL/min/1.73   GFR calc Af Amer 62 >59 mL/min/1.73   BUN/Creatinine Ratio 16 12 - 28   Sodium 144 134 - 144 mmol/L   Potassium 4.4 3.5 - 5.2 mmol/L   Chloride 100 96 - 106 mmol/L   CO2 30 (H) 20 - 29 mmol/L   Calcium 9.5 8.7 -  10.3 mg/dL  CBC     Status: Abnormal   Collection Time: 04/02/19  3:17 PM  Result Value Ref Range   WBC 9.0 3.4 - 10.8 x10E3/uL   RBC 3.92 3.77 - 5.28 x10E6/uL   Hemoglobin 11.1 11.1 - 15.9 g/dL   Hematocrit 34.5 34.0 - 46.6 %   MCV 88 79 - 97 fL   MCH 28.3 26.6 - 33.0 pg   MCHC 32.2 31.5 - 35.7 g/dL   RDW 15.7 (H) 11.7 - 15.4 %   Platelets 296 150 - 450 x10E3/uL   Objective   Body mass index is 46.02 kg/m. Wt Readings from Last 3 Encounters:  04/03/19 251 lb 9.6 oz (114.1 kg)  04/02/19 251 lb 9.6 oz (114.1 kg)  04/02/19 246 lb (111.6 kg)   Temp Readings from Last 3 Encounters:  04/03/19 (!) 96.6 F (35.9 C) (Temporal)  04/02/19 (!) 97.1 F (36.2 C)  12/13/18 (!) 97.5 F (36.4 C) (Temporal)   BP Readings from Last 3 Encounters:  04/03/19 124/84  04/02/19 110/68  04/02/19 98/67   Pulse Readings from Last 3 Encounters:  04/03/19 (!) 124  04/02/19 (!) 122  04/02/19 (!) 120    Physical Exam Vitals and nursing note reviewed.  Constitutional:      Appearance: She is well-developed. She is obese.  HENT:     Head: Normocephalic and atraumatic.  Eyes:     Pupils: Pupils are equal, round, and reactive to light.  Cardiovascular:     Rate and Rhythm: Normal rate and regular rhythm.     Heart sounds: Normal heart sounds.  Pulmonary:     Effort: Pulmonary effort is normal. No respiratory distress.     Breath sounds: Normal breath sounds.  Abdominal:     General: Bowel sounds are normal.     Palpations: Abdomen is soft.     Tenderness: There is no abdominal tenderness.  Skin:    General: Skin is warm and dry.  Neurological:     General: No focal deficit present.     Mental Status: She is alert and oriented to person, place, and time.  Psychiatric:        Mood and Affect: Mood normal.        Behavior: Behavior normal.     Assessment  Plan  Chest pain, unspecified type with Afib with RVR - Plan: Troponin I (High Sensitivity), DG Chest 2 View CC Dr Stanford Breed DCCV 04/09/19 after 04/05/19 covid test Cont metoprolol 50 mg bid new dose and f/u cards  If trop + will send to [facility] hospital   Elevated TSH - Plan: TSH Cont meds   Acute cystitis without hematuria - Plan: Urinalysis, Routine w reflex microscopic, Urine Culture  Chronic obstructive pulmonary disease, unspecified COPD type on 2.5 L cont O2 - Plan: Ambulatory referral to Pulmonology needs  new O2 machine portable   Fall, initial encounter Monitor   HM utd flu, prevnar, pna 23  Tdap and shingrix mailed Rx to get pharmacy  covid 1/2   HCV negative 04/11/15   Pap out of age window  [facility] 05/25/17 h/o atypical hyperplasia s/p removal right breast in 2017 Mammo [facility] in 2020 neg wants to go back to [facility] -pt to call and schedule with DEXA -referral in   DEXA 09/12/15 osteopenia consider repeat in 3-5 years  Colonoscopy Carlean Purl 01/15/16 diverticulosis no repeat was rec due to chronic medical conditions  CT chest ordered former smoker 30 years max 1.5 ppd  quit in 2008/2009 CT chest had 03/2018 messaged CT chest to sch 4 or 05/2019   H/o bl cataracts  Former PCP Dr. Cathlean Cower Cards Dr. Stanford Breed    Provider: Dr. Olivia Mackie McLean-Scocuzza-Internal Medicine

## 2019-04-04 ENCOUNTER — Ambulatory Visit: Payer: Medicare Other | Admitting: Internal Medicine

## 2019-04-04 LAB — URINALYSIS, ROUTINE W REFLEX MICROSCOPIC
Bilirubin Urine: NEGATIVE
Glucose, UA: NEGATIVE
Hgb urine dipstick: NEGATIVE
Hyaline Cast: NONE SEEN /LPF
Ketones, ur: NEGATIVE
Nitrite: NEGATIVE
Protein, ur: NEGATIVE
RBC / HPF: NONE SEEN /HPF (ref 0–2)
Specific Gravity, Urine: 1.012 (ref 1.001–1.03)
pH: 7.5 (ref 5.0–8.0)

## 2019-04-04 LAB — URINE CULTURE
MICRO NUMBER:: 10282237
SPECIMEN QUALITY:: ADEQUATE

## 2019-04-05 ENCOUNTER — Telehealth: Payer: Self-pay | Admitting: *Deleted

## 2019-04-05 ENCOUNTER — Other Ambulatory Visit (HOSPITAL_COMMUNITY)
Admission: RE | Admit: 2019-04-05 | Discharge: 2019-04-05 | Disposition: A | Discharge status: Home / Self Care | Payer: Medicare Other | Source: Ambulatory Visit | Attending: Internal Medicine | Admitting: Internal Medicine

## 2019-04-05 DIAGNOSIS — Z20822 Contact with and (suspected) exposure to covid-19: Secondary | ICD-10-CM | POA: Diagnosis not present

## 2019-04-05 DIAGNOSIS — Z01812 Encounter for preprocedural laboratory examination: Secondary | ICD-10-CM | POA: Insufficient documentation

## 2019-04-05 LAB — SARS CORONAVIRUS 2 (TAT 6-24 HRS): SARS Coronavirus 2: NEGATIVE

## 2019-04-05 NOTE — Telephone Encounter (Signed)
Contacted patient to discuss lung screening referral. Patient is SOB at rest. Says she is in afib and going to have cardioversion procedure next week. Instructed to contact EMS is symptoms worsen. Verbalized understanding. Will contact in future after current cardio/pulmonary status has stabilized.

## 2019-04-09 ENCOUNTER — Encounter (HOSPITAL_COMMUNITY)
Admission: RE | Disposition: A | Discharge status: Home / Self Care | Payer: Self-pay | Source: Home / Self Care | Attending: Internal Medicine

## 2019-04-09 ENCOUNTER — Ambulatory Visit (HOSPITAL_COMMUNITY): Payer: Medicare Other | Admitting: Anesthesiology

## 2019-04-09 ENCOUNTER — Encounter (HOSPITAL_COMMUNITY): Payer: Self-pay | Admitting: Internal Medicine

## 2019-04-09 ENCOUNTER — Ambulatory Visit (HOSPITAL_COMMUNITY)
Admission: RE | Admit: 2019-04-09 | Discharge: 2019-04-09 | Disposition: A | Discharge status: Home / Self Care | Payer: Medicare Other | Attending: Internal Medicine | Admitting: Internal Medicine

## 2019-04-09 ENCOUNTER — Other Ambulatory Visit: Payer: Self-pay

## 2019-04-09 DIAGNOSIS — Z9981 Dependence on supplemental oxygen: Secondary | ICD-10-CM | POA: Diagnosis not present

## 2019-04-09 DIAGNOSIS — I4892 Unspecified atrial flutter: Secondary | ICD-10-CM | POA: Insufficient documentation

## 2019-04-09 DIAGNOSIS — Z79899 Other long term (current) drug therapy: Secondary | ICD-10-CM | POA: Diagnosis not present

## 2019-04-09 DIAGNOSIS — I4819 Other persistent atrial fibrillation: Secondary | ICD-10-CM | POA: Diagnosis not present

## 2019-04-09 DIAGNOSIS — Z87891 Personal history of nicotine dependence: Secondary | ICD-10-CM | POA: Insufficient documentation

## 2019-04-09 DIAGNOSIS — Z853 Personal history of malignant neoplasm of breast: Secondary | ICD-10-CM | POA: Diagnosis not present

## 2019-04-09 DIAGNOSIS — I110 Hypertensive heart disease with heart failure: Secondary | ICD-10-CM | POA: Diagnosis not present

## 2019-04-09 DIAGNOSIS — E119 Type 2 diabetes mellitus without complications: Secondary | ICD-10-CM | POA: Diagnosis not present

## 2019-04-09 DIAGNOSIS — F419 Anxiety disorder, unspecified: Secondary | ICD-10-CM | POA: Insufficient documentation

## 2019-04-09 DIAGNOSIS — Z86718 Personal history of other venous thrombosis and embolism: Secondary | ICD-10-CM | POA: Diagnosis not present

## 2019-04-09 DIAGNOSIS — I5032 Chronic diastolic (congestive) heart failure: Secondary | ICD-10-CM | POA: Diagnosis not present

## 2019-04-09 DIAGNOSIS — E785 Hyperlipidemia, unspecified: Secondary | ICD-10-CM | POA: Diagnosis not present

## 2019-04-09 DIAGNOSIS — E782 Mixed hyperlipidemia: Secondary | ICD-10-CM | POA: Insufficient documentation

## 2019-04-09 DIAGNOSIS — F329 Major depressive disorder, single episode, unspecified: Secondary | ICD-10-CM | POA: Diagnosis not present

## 2019-04-09 DIAGNOSIS — J449 Chronic obstructive pulmonary disease, unspecified: Secondary | ICD-10-CM | POA: Diagnosis not present

## 2019-04-09 DIAGNOSIS — Z7901 Long term (current) use of anticoagulants: Secondary | ICD-10-CM | POA: Insufficient documentation

## 2019-04-09 DIAGNOSIS — E039 Hypothyroidism, unspecified: Secondary | ICD-10-CM | POA: Diagnosis not present

## 2019-04-09 DIAGNOSIS — I480 Paroxysmal atrial fibrillation: Secondary | ICD-10-CM | POA: Insufficient documentation

## 2019-04-09 DIAGNOSIS — I350 Nonrheumatic aortic (valve) stenosis: Secondary | ICD-10-CM | POA: Diagnosis not present

## 2019-04-09 DIAGNOSIS — Z79890 Hormone replacement therapy (postmenopausal): Secondary | ICD-10-CM | POA: Diagnosis not present

## 2019-04-09 DIAGNOSIS — I5033 Acute on chronic diastolic (congestive) heart failure: Secondary | ICD-10-CM | POA: Diagnosis not present

## 2019-04-09 HISTORY — PX: CARDIOVERSION: SHX1299

## 2019-04-09 SURGERY — CARDIOVERSION
Anesthesia: General

## 2019-04-09 MED ORDER — LIDOCAINE 2% (20 MG/ML) 5 ML SYRINGE
INTRAMUSCULAR | Status: DC | PRN
  Administered 2019-04-09: 60 mg via INTRAVENOUS

## 2019-04-09 MED ORDER — PROPOFOL 10 MG/ML IV BOLUS
INTRAVENOUS | Status: DC | PRN
  Administered 2019-04-09: 50 mg via INTRAVENOUS
  Administered 2019-04-09: 20 mg via INTRAVENOUS

## 2019-04-09 MED ORDER — SODIUM CHLORIDE 0.9 % IV SOLN: INTRAVENOUS | Status: DC | PRN

## 2019-04-09 NOTE — Transfer of Care (Signed)
Immediate Anesthesia Transfer of Care Note  Patient: [name] [name]  Procedure(s) Performed: CARDIOVERSION (N/A )  Patient Location: Endoscopy Unit  Anesthesia Type:General  Level of Consciousness: drowsy and patient cooperative  Airway & Oxygen Therapy: Patient Spontanous Breathing  Post-op Assessment: Report given to RN, Post -op Vital signs reviewed and stable and Patient moving all extremities X 4  Post vital signs: Reviewed and stable  Last Vitals:  Vitals Value Taken Time  BP    Temp    Pulse    Resp    SpO2      Last Pain:  Vitals:   04/09/19 1001  TempSrc: Temporal  PainSc: 0-No pain         Complications: No apparent anesthesia complications

## 2019-04-09 NOTE — H&P (Signed)
   INTERVAL PROCEDURE H&P  History and Physical Interval Note:  [id number] AM  [name] has presented today for their planned procedure. The various methods of treatment have been discussed with the patient and family. After consideration of risks, benefits and other options for treatment, the patient has consented to the procedure.  The patients' outpatient history has been reviewed, patient examined, and no change in status from most recent office note within the past 30 days. I have reviewed the patients' chart and labs and will proceed as planned. Questions were answered to the patient's satisfaction.   Pixie Casino, MD, [facility], [place] Director of the Advanced Lipid Disorders &  Cardiovascular Risk Reduction Clinic Diplomate of the American Board of Clinical Lipidology Attending Cardiologist  Direct Dial: [phone]  Fax: [phone]  Website:  [name] [name] [name] 04/09/2019, 10:25 AM

## 2019-04-09 NOTE — Discharge Instructions (Signed)
Electrical Cardioversion   What can I expect after the procedure?  Your blood pressure, heart rate, breathing rate, and blood oxygen level will be monitored until you leave the hospital or clinic.  Your heart rhythm will be watched to make sure it does not change.  You may have some redness on the skin where the shocks were given. Follow these instructions at home:  Do not drive for 24 hours if you were given a sedative during your procedure.  Take over-the-counter and prescription medicines only as told by your health care provider.  Ask your health care provider how to check your pulse. Check it often.  Rest for 48 hours after the procedure or as told by your health care provider.  Avoid or limit your caffeine use as told by your health care provider.  Keep all follow-up visits as told by your health care provider. This is important. Contact a health care provider if:  You feel like your heart is beating too quickly or your pulse is not regular.  You have a serious muscle cramp that does not go away. Get help right away if:  You have discomfort in your chest.  You are dizzy or you feel faint.  You have trouble breathing or you are short of breath.  Your speech is slurred.  You have trouble moving an arm or leg on one side of your body.  Your fingers or toes turn cold or blue. Summary  Electrical cardioversion is the delivery of a jolt of electricity to restore a normal rhythm to the heart.  This procedure may be done right away in an emergency or may be a scheduled procedure if the condition is not an emergency.  Generally, this is a safe procedure.  After the procedure, check your pulse often as told by your health care provider. This information is not intended to replace advice given to you by your health care provider. Make sure you discuss any questions you have with your health care provider. Document Revised: 07/31/2018 Document Reviewed:  07/31/2018 Elsevier Patient Education  2020 Elsevier Inc.  

## 2019-04-09 NOTE — Anesthesia Postprocedure Evaluation (Signed)
Anesthesia Post Note  Patient: DUSTINE LAPORTA  Procedure(s) Performed: CARDIOVERSION (N/A )     Patient location during evaluation: Endoscopy Anesthesia Type: General Level of consciousness: awake and alert Pain management: pain level controlled Vital Signs Assessment: post-procedure vital signs reviewed and stable Respiratory status: spontaneous breathing, nonlabored ventilation and respiratory function stable Cardiovascular status: blood pressure returned to baseline and stable Postop Assessment: no apparent nausea or vomiting Anesthetic complications: no    Last Vitals:  Vitals:   04/09/19 1115 04/09/19 1125  BP: (!) 96/51 (!) 104/53  Pulse: (!) 57 (!) 56  Resp: 14 14  Temp:    SpO2: 100% 100%    Last Pain:  Vitals:   04/09/19 1054  TempSrc:   PainSc: 0-No pain                 Arsalan Brisbin,W. EDMOND

## 2019-04-09 NOTE — CV Procedure (Signed)
   CARDIOVERSION NOTE  Procedure: Electrical Cardioversion Indications:  Atrial Fibrillation  Procedure Details:  Consent: Risks of procedure as well as the alternatives and risks of each were explained to the (patient/caregiver).  Consent for procedure obtained.  Time Out: Verified patient identification, verified procedure, site/side was marked, verified correct patient position, special equipment/implants available, medications/allergies/relevent history reviewed, required imaging and test results available.  Performed  Patient placed on cardiac monitor, pulse oximetry, supplemental oxygen as necessary.  Sedation given: propofol per anesthesia Pacer pads placed anterior and posterior chest.  Cardioverted 3 time(s).  Cardioverted at 150J and 200J x 2.  Impression: Findings: Post procedure EKG shows: sinus bradycardia with a short PR interval Complications: None Patient did tolerate procedure well.  Plan: 1. Ultimately successful DCCV after 3 stacked shocks.  Time Spent Directly with the Patient:  30 minutes   Pixie Casino, MD, [facility], [place] Director of the Advanced Lipid Disorders &  Cardiovascular Risk Reduction Clinic Diplomate of the American Board of Clinical Lipidology Attending Cardiologist  Direct Dial: [phone]  Fax: [phone]  Website:  [email or web address] 04/09/2019, 10:51 AM

## 2019-04-09 NOTE — Anesthesia Preprocedure Evaluation (Signed)
Anesthesia Evaluation  Patient identified by MRN, date of birth, ID band Patient awake    Reviewed: Allergy & Precautions, NPO status , Patient's Chart, lab work & pertinent test results, reviewed documented beta blocker date and time   History of Anesthesia Complications (+) PONV and history of anesthetic complications  Airway Mallampati: II  TM Distance: >3 FB Neck ROM: Full    Dental  (+) Teeth Intact, Dental Advisory Given   Pulmonary asthma , COPD,  COPD inhaler and oxygen dependent, former smoker,    Pulmonary exam normal breath sounds clear to auscultation       Cardiovascular hypertension, Pt. on medications and Pt. on home beta blockers + Peripheral Vascular Disease, +CHF and + DVT  Normal cardiovascular exam+ dysrhythmias Atrial Fibrillation + Valvular Problems/Murmurs AS  Rhythm:Regular Rate:Tachycardia  Echo 07/2018 1. The left ventricle has normal systolic function with an ejection fraction of 60-65%. The cavity size was normal. There is moderately increased left ventricular wall thickness. Left ventricular diastolic Doppler parameters are consistent with pseudonormalization. Elevated left ventricular end-diastolic pressure The E/e' is >20.  2. The right ventricle has normal systolic function. The cavity was normal. There is no increase in right ventricular wall thickness. Right ventricular systolic pressure is mildly elevated with an estimated  pressure of 32.2 mmHg.  3. Left atrial size was severely dilated.  4. The aortic valve is abnormal. Mild calcification of the aortic valve. Aortic valve regurgitation is trivial by color flow Doppler. Mild stenosis of the aortic valve. Mean gradient 11 mmHg. Difference in mean gradients between exams may be secondary to increased flow velocities across LVOT on prior study.  5. The mitral valve is abnormal. There is moderate to severe mitral annular calcification present. Mean  diastolic gradient 3 mmHg at HR 64 bpm.    Neuro/Psych PSYCHIATRIC DISORDERS Anxiety Depression    GI/Hepatic GERD  Medicated and Controlled,  Endo/Other  Hypothyroidism Morbid obesity  Renal/GU      Musculoskeletal  (+) Arthritis ,   Abdominal (+) + obese,   Peds  Hematology  (+) anemia ,   Anesthesia Other Findings   Reproductive/Obstetrics                             Anesthesia Physical  Anesthesia Plan  ASA: III  Anesthesia Plan: General   Post-op Pain Management:    Induction: Intravenous  PONV Risk Score and Plan: 4 or greater and Propofol infusion, TIVA and Treatment may vary due to age or medical condition  Airway Management Planned: Natural Airway  Additional Equipment:   Intra-op Plan:   Post-operative Plan:   Informed Consent: I have reviewed the patients History and Physical, chart, labs and discussed the procedure including the risks, benefits and alternatives for the proposed anesthesia with the patient or authorized representative who has indicated his/her understanding and acceptance.     Dental advisory given  Plan Discussed with: CRNA  Anesthesia Plan Comments:         Anesthesia Quick Evaluation

## 2019-04-16 ENCOUNTER — Ambulatory Visit: Payer: Medicare Other | Attending: Internal Medicine

## 2019-04-16 DIAGNOSIS — Z23 Encounter for immunization: Secondary | ICD-10-CM

## 2019-04-16 NOTE — Progress Notes (Signed)
   Covid-19 Vaccination Clinic  Name:  [name] [name]    MRN: [id number] DOB: 1950-03-28  04/16/2019  [name] was observed post Covid-19 immunization for 15 minutes without incident. She was provided with Vaccine Information Sheet and instruction to access the V-Safe system.   [name] was instructed to call 911 with any severe reactions post vaccine: Marland Kitchen Difficulty breathing  . Swelling of face and throat  . A fast heartbeat  . A bad rash all over body  . Dizziness and weakness   Immunizations Administered    Name Date Dose VIS Date Route   Pfizer COVID-19 Vaccine 04/16/2019  2:38 PM 0.3 mL 12/22/2018 Intramuscular   Manufacturer: [place]   Lot: [id number]   [place]: [id number]

## 2019-04-18 ENCOUNTER — Telehealth: Payer: Self-pay | Admitting: Cardiology

## 2019-04-18 NOTE — Telephone Encounter (Signed)
Scheduled patient appointment for Friday with Thomasene Mohair NP

## 2019-04-18 NOTE — Telephone Encounter (Signed)
Pt calling in with elevated HR. She states that she recently had a cardioversion with Dr.Hilty on 3/29. She reports that she had her last covid shot on Monday and her HR has been fluctuating since then. She states her BP has been fine and her O2 is fine, but her HR is 123. She denies chest pain but states that she can feel a difference in her HR and felt like she couldn't go out today because of this "I can feel my heart flutter every once in a while". She endorses SOB but states she has COPD and is on O2 at all times. She wants to know if Dr.Crenshaw thinks this is related to the Covid shot she received.  Notified pt that if she begins to have Chest pain to seek immediate evaluation in the ER. Advised her to continue to monitor her BP and HR and that I would route this message to [facility] for review. Pt verbalized understanding and had no other questions at this time. Will route to MD.

## 2019-04-18 NOTE — Telephone Encounter (Signed)
New message      STAT if HR is under 50 or over 120 (normal HR is 60-100 beats per minute)  1) What is your heart rate? 123  Do you have a log of your heart rate readings (document readings)? No   2) Do you have any other symptoms? No

## 2019-04-18 NOTE — Telephone Encounter (Signed)
Paov Rhonna Holster  

## 2019-04-24 ENCOUNTER — Other Ambulatory Visit: Payer: Self-pay | Admitting: Internal Medicine

## 2019-04-24 DIAGNOSIS — E039 Hypothyroidism, unspecified: Secondary | ICD-10-CM

## 2019-04-24 DIAGNOSIS — J309 Allergic rhinitis, unspecified: Secondary | ICD-10-CM

## 2019-04-24 MED ORDER — LEVOTHYROXINE SODIUM 50 MCG PO TABS: 50.0000 ug | ORAL_TABLET | Freq: Every day | ORAL | 3 refills | Status: DC

## 2019-04-24 MED ORDER — LEVOCETIRIZINE DIHYDROCHLORIDE 5 MG PO TABS: 5.0000 mg | ORAL_TABLET | Freq: Every evening | ORAL | 3 refills | Status: AC | PRN

## 2019-04-24 NOTE — Progress Notes (Signed)
Cardiology Clinic Note   Patient Name: [name] [name] Date of Encounter: 04/25/2019  Primary Care Provider:  McLean-Scocuzza, Nino Glow, MD Primary Cardiologist:  Kirk Ruths, MD  Patient Profile    [name] [name] 69 year old female presents today for follow-up post DCCV.  Past Medical History    Past Medical History:  Diagnosis Date  . Anemia    hx  . Anxiety   . Arthritis   . Asthma   . Atrial fibrillation and flutter ([place])   . Atypical lobular hyperplasia of right breast 05/09/2015  . Breast CA ([place])    ?  Marland Kitchen Chronic rhinitis   . COPD (chronic obstructive pulmonary disease) ([place])    [place]. PFTs 07/25/06 FEV1 55% ratio 53, DLC0 51% HFA 50% 12/16/2009  . Depression   . FRACTURE, RIB, RIGHT 06/18/2009  . Glucose intolerance (impaired glucose tolerance)    on steroids  . History of DVT (deep vein thrombosis)   . Hx of cardiac catheterization    LHC (01/2001):  Normal Cors.  EF 65%.;  LexiScan Myoview (03/2013): No ischemia, EF 61%, normal  . Hx of echocardiogram    Echo (11/2011):  Mod LVH, EF 60-65%, no RWMA, MAC, mild LAE, PASP 34.  Marland Kitchen HYPERLIPIDEMIA   . Hypertension   . Hypothyroidism   . Morbid obesity ([place])    target wt=179lb for BMI<30 Peak wt 282lb  . Osteoporosis    last BMD 4/08 -2.4, intolerant of bisphosphonates  . Peripheral vascular disease ([facility])    hx dvt.pe  . Pneumonia    hx  . PONV (postoperative nausea and vomiting)   . PREMATURE VENTRICULAR CONTRACTIONS   . PULMONARY EMBOLISM, HX OF 06/19/2007  . Recurrent upper respiratory infection (URI)   . Rhabdomyolysis 07/29/2009  . Tremor   . VITAMIN D DEFICIENCY    Past Surgical History:  Procedure Laterality Date  . A-FLUTTER ABLATION N/A 10/08/2016   Procedure: A-FLUTTER ABLATION;  Surgeon: Evans Lance, MD;  Location: [facility];  Service: Cardiovascular;  Laterality: N/A;  . ABDOMINAL HYSTERECTOMY    . BREAST LUMPECTOMY WITH RADIOACTIVE SEED LOCALIZATION Right 06/05/2015   Procedure: RIGHT  BREAST LUMPECTOMY WITH RADIOACTIVE SEED LOCALIZATION;  Surgeon: Fanny Skates, MD;  Location: [facility];  Service: General;  Laterality: Right;  . BREAST SURGERY Left    s/p mass removal  . CARDIOVERSION N/A 12/11/2014   Procedure: CARDIOVERSION;  Surgeon: Josue Hector, MD;  Location: [facility];  Service: Cardiovascular;  Laterality: N/A;  . CARDIOVERSION N/A 09/15/2016   Procedure: CARDIOVERSION;  Surgeon: Jerline Pain, MD;  Location: [facility]COPY;  Service: Cardiovascular;  Laterality: N/A;  . CARDIOVERSION N/A 04/09/2019   Procedure: CARDIOVERSION;  Surgeon: Pixie Casino, MD;  Location: [facility]OSCOPY;  Service: Cardiovascular;  Laterality: N/A;  . COLONOSCOPY  10/22/2003, ?2006  . COLONOSCOPY WITH PROPOFOL N/A 01/15/2016   Procedure: COLONOSCOPY WITH PROPOFOL;  Surgeon: Gatha Mayer, MD;  Location: [facility];  Service: Endoscopy;  Laterality: N/A;  . ESOPHAGOGASTRODUODENOSCOPY (EGD) WITH PROPOFOL N/A 01/15/2016   Procedure: ESOPHAGOGASTRODUODENOSCOPY (EGD) WITH PROPOFOL;  Surgeon: Gatha Mayer, MD;  Location: [facility];  Service: Endoscopy;  Laterality: N/A;  . s/p left arm fracture with fall off chair    . skin graft to middle R finger  1975  . TEE WITHOUT CARDIOVERSION N/A 12/11/2014   Procedure: TRANSESOPHAGEAL ECHOCARDIOGRAM (TEE);  Surgeon: Josue Hector, MD;  Location: [facility];  Service: Cardiovascular;  Laterality: N/A;  . TONSILLECTOMY  Allergies  Allergies  Allergen Reactions  . Duloxetine Other (See Comments)    REACTION: rhabdomyolysis  . Penicillins Other (See Comments)    SYNCOPE Has patient had a PCN reaction causing immediate rash, facial/tongue/throat swelling, SOB or lightheadedness with hypotension: Yes Has patient had a PCN reaction causing severe rash involving mucus membranes or skin necrosis: No Has patient had a PCN reaction that required hospitalization No Has patient had a PCN reaction occurring within the last 10 years: No If all of  the above answers are "NO", then may proceed with Cephalosporin use.   . Latex Rash    History of Present Illness   She has a past medical history of atrial fibrillation/atrial flutter on Coumadin, essential hypertension, PVCs, acute on chronic diastolic CHF, COPD, degenerative disc disease lumbar, hyperlipidemia, anxiety, depression, PE and morbid obesity  She underwent cardiac catheterization 2003 which showed normal coronaries.  Nuclear stress test 2015 showed an EF of 61% and normal perfusion.  She was seen by Dr. Martinique in 2016 and noted to be in atrial flutter.  She had a subsequent TEE and cardioversion 11/16.  She underwent atrial flutter ablation in 2018.  She was noted to have recurrent atrial fibrillation 2018 that required DCCV.  Echocardiogram 7/20 showed normal LV function, moderate LVH, G2 DD, severe LAE, mild AS with mean gradient of 11 mmHg, and trace aortic insufficiency.  She was seen by Dr. Stanford Breed 12/28/2018.  During that time amlodipine 5 mg was added, weight loss was encouraged, and TAVR was discussed in the event that her aortic stenosis progressed.  She was contacted by Rosaria Ferries, PA-C on 03/31/2019.  She indicated that she had had a recent fall and felt like she had gone back into atrial fibrillation.  She was feeling palpitations and her blood pressure was also elevated.  Her metoprolol was increased to 50 mg twice daily at that time.  She was seen in follow-up by Katina Dung, NP on 04/02/2019.  She indicated that she continued to have palpitations and a flutter sensation with increased dyspnea on exertion.  She also noted that she had been more fatigued since the palpitations are started.  She has not missed any doses of Eliquis.  She had misunderstood her medication instructions and was only taking 25 mg twice daily instead of 50 mg twice daily.  Her EKG showed atrial fibrillation with RVR 143 bpm.  04/09/2019 successful DCCV after three shocks.  She tolerated procedure  well.  She contacted the nurse triage line on 04/18/2019 and indicated that her heart rate was in the 120s.  She did not express any other cardiac symptoms at that time.  She presents today for follow-up and states that she feels fairly well.  She does not have any increased work of breathing and does not feel like she has activity intolerance.  She is only noticing palpitations when she lays down at night.  She states these are brief and not particularly bothersome.  Initially on exam today her pulse is 77.  After climbing the exam table and laying down her pulse increased to 119 and her EKG shows atrial fibrillation with rapid ventricular response. Options of repeat cardioversion, continue Eliquis without cardioversion, consult with electrophysiology, and consult with A. fib clinic were discussed. Shared decision making was used with her, her son, and myself. At this time they decided they would like to keep their appointment with Dr. Stanford Breed on 29th to think about their options and reevaluate at that time.  Today she denies denies chest pain, shortness of breath, lower extremity edema, fatigue, melena, hematuria, hemoptysis, diaphoresis, weakness, presyncope, syncope, orthopnea, and PND.   Home Medications    Prior to Admission medications   Medication Sig Start Date End Date Taking? Authorizing Provider  amLODipine (NORVASC) 5 MG tablet Take 1 tablet (5 mg total) by mouth daily. 03/02/19 05/31/19  Lelon Perla, MD  atorvastatin (LIPITOR) 10 MG tablet Take 1 tablet (10 mg total) by mouth daily at 6 PM. 08/31/18   McLean-Scocuzza, Nino Glow, MD  azelastine (ASTELIN) 0.1 % nasal spray Place 2 sprays into both nostrils at bedtime as needed for rhinitis. Use in each nostril as directed 03/12/18   McLean-Scocuzza, Nino Glow, MD  benzonatate (TESSALON) 200 MG capsule Take 1 capsule (200 mg total) by mouth 3 (three) times daily as needed for cough. 04/20/18   Wilhelmina Mcardle, MD  Calcium Carbonate-Vitamin D  600-400 MG-UNIT per tablet Take 1 tablet by mouth daily.     [provider]  ELIQUIS 5 MG TABS tablet TAKE 1 TABLET BY MOUTH  TWICE A DAY Patient taking differently: Take 5 mg by mouth 2 (two) times daily.  12/15/18   Lelon Perla, MD  famotidine (PEPCID) 20 MG tablet Take 1 tablet (20 mg total) by mouth daily. At bedtime Patient taking differently: Take 20 mg by mouth daily.  03/04/19   McLean-Scocuzza, Nino Glow, MD  FLUoxetine (PROZAC) 40 MG capsule TAKE 1 CAPSULE BY MOUTH  DAILY 10/09/18   Biagio Borg, MD  Fluticasone-Umeclidin-Vilant (TRELEGY ELLIPTA) 100-62.5-25 MCG/INH AEPB Inhale 1 puff into the lungs daily. 09/04/18   Wilhelmina Mcardle, MD  furosemide (LASIX) 80 MG tablet TAKE 1 TABLET BY MOUTH IN  THE MORNING AND 1 TABLET IN THE AFTERNOON Patient taking differently: Take 40 mg by mouth in the morning and at bedtime. Morning & Afternoon 02/05/19   Lelon Perla, MD  Glucosamine-Chondroitin (COSAMIN DS PO) Take 1 tablet by mouth daily.    [provider]  KLOR-CON M20 20 MEQ tablet TAKE 1 TABLET BY MOUTH  TWICE DAILY Patient taking differently: Take 20 mEq by mouth 2 (two) times daily.  03/27/19   Lelon Perla, MD  levalbuterol Penne Lash) 1.25 MG/3ML nebulizer solution Take 1.25 mg by nebulization every 4 (four) hours. Patient taking differently: Take 1.25 mg by nebulization every 4 (four) hours as needed for wheezing or shortness of breath.  09/28/16   Tanda Rockers, MD  levocetirizine (XYZAL) 5 MG tablet Take 1 tablet (5 mg total) by mouth daily as needed for allergies. Patient taking differently: Take 5 mg by mouth at bedtime as needed for allergies.  03/17/18   Wilhelmina Mcardle, MD  levothyroxine (SYNTHROID) 50 MCG tablet Take 1 tablet (50 mcg total) by mouth daily. mylan brand 04/24/19   McLean-Scocuzza, Nino Glow, MD  metoprolol tartrate (LOPRESSOR) 50 MG tablet Take 1 tablet (50 mg total) by mouth 2 (two) times daily. 04/02/19   Verta Ellen., NP    olopatadine (PATANOL) 0.1 % ophthalmic solution Place 1 drop into both eyes 2 (two) times daily. 04/12/18   McLean-Scocuzza, Nino Glow, MD  OXYGEN Inhale 2.5 L/min into the lungs continuous. And 4 with exertion    [provider]  pantoprazole (PROTONIX) 40 MG tablet TAKE 1 TABLET BY MOUTH  DAILY. TAKE 30-60 MINUTES  BEFORE FIRST MEAL OF THE  DAY Patient taking differently: Take 40 mg by mouth daily before breakfast.  02/26/19  Tanda Rockers, MD  propafenone (RYTHMOL) 150 MG tablet Take 1 tablet (150 mg total) by mouth every 8 (eight) hours. Patient taking differently: Take 150 mg by mouth in the morning, at noon, and at bedtime.  10/17/18   Lelon Perla, MD  sodium chloride (OCEAN) 0.65 % SOLN nasal spray Place 2 sprays every 4 (four) hours as needed into both nostrils for congestion.    [provider]    Family History    Family History  Problem Relation Age of Onset  . Heart disease Mother   . Liver disease Mother        alcohol related  . Asthma Brother   . Esophageal cancer Brother   . Asthma Son   . Colon cancer Neg Hx   . Stomach cancer Neg Hx   . Pancreatic cancer Neg Hx   . Inflammatory bowel disease Neg Hx   . Allergic rhinitis Neg Hx   . Eczema Neg Hx   . Urticaria Neg Hx    She indicated that her mother is deceased. She indicated that her father is deceased. She indicated that only one of her three brothers is alive. She indicated that her maternal grandmother is deceased. She indicated that her maternal grandfather is deceased. She indicated that her paternal grandmother is deceased. She indicated that her paternal grandfather is deceased. She indicated that the status of her son is unknown. She indicated that her child is alive. She indicated that the status of her neg hx is unknown.  Social History    Social History   Socioeconomic History  . Marital status: Married    Spouse name: Not on file  . Number of children: 3  . Years of education:  Not on file  . Highest education level: Not on file  Occupational History  . 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 Smoking status: Former Smoker    Packs/day: 1.00    Years: 35.00    Pack years: 35.00    Types: Cigarettes    Quit date: 01/11/2006    Years since quitting: 13.2  . Smokeless tobacco: Never Used  Substance and Sexual Activity  . Alcohol use: No    Alcohol/week: 0.0 standard drinks  . Drug use: No  . Sexual activity: Yes  Other Topics Concern  . Not on file  Social History Narrative   Married 1 son 2 daughters   Disabled   2 caffeine/day   Past smoker   11/12/2015   Social Determinants of Health   Financial Resource Strain:   . Difficulty of Paying Living Expenses:   Food Insecurity:   . Worried About Charity fundraiser in the Last Year:   . Arboriculturist in the Last Year:   Transportation Needs:   . Film/video editor (Medical):   Marland Kitchen Lack of Transportation (Non-Medical):   Physical Activity:   . Days of Exercise per Week:   . Minutes of Exercise per Session:   Stress:   . Feeling of Stress :   Social Connections:   . Frequency of Communication with Friends and Family:   . Frequency of Social Gatherings with Friends and Family:   . Attends Religious Services:   . Active Member of Clubs or Organizations:   . Attends Archivist Meetings:   Marland Kitchen Marital Status:   Intimate Partner Violence:   . Fear of Current or Ex-Partner:   .  Emotionally Abused:   Marland Kitchen Physically Abused:   . Sexually Abused:      Review of Systems    General:  No chills, fever, night sweats or weight changes.  Cardiovascular:  No chest pain, dyspnea on exertion, edema, orthopnea, palpitations, paroxysmal nocturnal dyspnea. Dermatological: No rash, lesions/masses Respiratory: No cough, dyspnea Urologic: No hematuria, dysuria Abdominal:   No nausea, vomiting, diarrhea, bright red blood per rectum, melena, or  hematemesis Neurologic:  No visual changes, wkns, changes in mental status. All other systems reviewed and are otherwise negative except as noted above.  Physical Exam    VS:  BP 108/70   Pulse 77   Ht _0  (1.575 m)   Wt 248 lb (112.5 kg)   SpO2 97%   BMI 45.36 kg/m  , BMI Body mass index is 45.36 kg/m. GEN: Well nourished, well developed, in no acute distress. HEENT: normal. Neck: Supple, no JVD, carotid bruits, or masses. Cardiac: RRR, no murmurs, rubs, or gallops. No clubbing, cyanosis, edema.  Radials/DP/PT 2+ and equal bilaterally.  Respiratory:  Respirations regular and unlabored, clear to auscultation bilaterally. GI: Soft, nontender, nondistended, BS + x 4. MS: no deformity or atrophy. Skin: warm and dry, no rash. Neuro:  Strength and sensation are intact. Psych: Normal affect.  Accessory Clinical Findings    ECG personally reviewed by me today-atrial fibrillation with rapid ventricular response nonspecific ST and T wave abnormality 119 bpm  EKG 04/09/2019 Sinus bradycardia with PACs 59 bpm  Echocardiogram 07/17/2018 IMPRESSIONS    1. The left ventricle has normal systolic function with an ejection  fraction of 60-65%. The cavity size was normal. There is moderately  increased left ventricular wall thickness. Left ventricular diastolic  Doppler parameters are consistent with  pseudonormalization. Elevated left ventricular end-diastolic pressure The  E/e' is >20.  2. The right ventricle has normal systolic function. The cavity was  normal. There is no increase in right ventricular wall thickness. Right  ventricular systolic pressure is mildly elevated with an estimated  pressure of 32.2 mmHg.  3. Left atrial size was severely dilated.  4. The aortic valve is abnormal. Mild calcification of the aortic valve.  Aortic valve regurgitation is trivial by color flow Doppler. Mild stenosis  of the aortic valve. Mean gradient 11 mmHg. Difference in mean gradients    between exams may be secondary  to increased flow velocities across LVOT on prior study.  5. The mitral valve is abnormal. There is moderate to severe mitral  annular calcification present. Mean diastolic gradient 3 mmHg at HR 64  bpm.   Assessment & Plan   1.  Paroxysmal atrial fibrillation-EKG today shows atrial fibrillation with rapid ventricular response 119 bpm.  Underwent successful DCCV on 3/29/2021after three shocks.  She tolerated procedure well. Continue Rythmol 150 mg 3 times daily Continue Eliquis 5 mg twice daily Continue furosemide 80 mg twice daily Continue potassium 20 mEq twice daily Avoid triggers caffeine, chocolate, EtOH, stimulants, diet medication, etc.   Chronic diastolic CHF-euvolemic today.  No increased work of breathing. She continues to use 2.5 L at rest and 4 L with exertion. Continue metoprolol 50 mg twice daily Continue furosemide 80 mg twice daily Heart healthy low-sodium diet Increase physical activity as tolerated Daily weights  Moderate aortic stenosis-echocardiogram 07/2018 showed mild aortic stenosis.  TAVR procedure has been discussed with patient. Continue to monitor  Essential hypertension-BP today 108/70.   Continue amlodipine Continue metoprolol Heart healthy low-sodium diet-salty six given Increase physical activity as  tolerated  Hyperlipidemia-LDL 93 on 12/2018. Continue Lipitor 10 mg daily Heart healthy low-sodium high-fiber diet Increase physical activity as tolerated  Morbid obesity-weight today 248 pounds which is down from 251 pounds on 04/02/2019. Continue weight loss Heart healthy low-sodium diet  Disposition: Follow-up withDr. Stanford Breed as scheduled.  Jossie Ng. [facility] [place] [address] Office [phone] Fax [phone]

## 2019-04-24 NOTE — Telephone Encounter (Signed)
Contacted in attempt to schedule lung screening scan. Patient reports she is still dealing with cardiac issues and request to consider lung screening next week.

## 2019-04-25 ENCOUNTER — Other Ambulatory Visit: Payer: Self-pay

## 2019-04-25 ENCOUNTER — Ambulatory Visit: Payer: Medicare Other | Admitting: General Practice

## 2019-04-25 ENCOUNTER — Encounter: Payer: Self-pay | Admitting: General Practice

## 2019-04-25 VITALS — BP 108/70 | HR 77 | Ht 62.0 in | Wt 248.0 lb

## 2019-04-25 DIAGNOSIS — I350 Nonrheumatic aortic (valve) stenosis: Secondary | ICD-10-CM

## 2019-04-25 DIAGNOSIS — I10 Essential (primary) hypertension: Secondary | ICD-10-CM | POA: Diagnosis not present

## 2019-04-25 DIAGNOSIS — E782 Mixed hyperlipidemia: Secondary | ICD-10-CM

## 2019-04-25 DIAGNOSIS — I480 Paroxysmal atrial fibrillation: Secondary | ICD-10-CM

## 2019-04-25 NOTE — Patient Instructions (Signed)
Medication Instructions:  The current medical regimen is effective;  continue present plan and medications as directed. Please refer to the Current Medication list given to you today. *If you need a refill on your cardiac medications before your next appointment, please call your pharmacy*  Special Instructions Please try to avoid:  Do not use any products that have nicotine or tobacco in them. These include cigarettes, e-cigarettes, and chewing tobacco. If you need help quitting, ask your doctor.  Eat heart-healthy foods. Talk with your doctor about the right eating plan for you.  Exercise regularly as told by your doctor.  Do not drink alcohol, Caffeine or chocolate.  Lose weight if you are overweight.  Do not use drugs, including cannabis  PLEASE READ AND FOLLOW SALTY 6-ATTACHED  PLEASE READ AND FOLLOW LOW SALT HEART HEALTHY DIET  Follow-Up: Your next appointment:  KEEP SCHEDULED APPOINTMENT  In Person with Kirk Ruths, MD  At [facility], you and your health needs are our priority.  As part of our continuing mission to provide you with exceptional heart care, we have created designated Provider Care Teams.  These Care Teams include your primary Cardiologist (physician) and Advanced Practice Providers (APPs -  Physician Assistants and Nurse Practitioners) who all work together to provide you with the care you need, when you need it.      Fat and Cholesterol Restricted Eating Plan Getting too much fat and cholesterol in your diet may cause health problems. Choosing the right foods helps keep your fat and cholesterol at normal levels. This can keep you from getting certain diseases.What are tips for following this plan? Meal planning  At meals, divide your plate into four equal parts: ? Fill one-half of your plate with vegetables and green salads. ? Fill one-fourth of your plate with whole grains. ? Fill one-fourth of your plate with low-fat (lean) protein foods.  Eat  fish that is high in omega-3 fats at least two times a week. This includes mackerel, tuna, sardines, and salmon.  Eat foods that are high in fiber, such as whole grains, beans, apples, broccoli, carrots, peas, and barley. General tips   Work with your doctor to lose weight if you need to.  Avoid: ? Foods with added sugar. ? Fried foods. ? Foods with partially hydrogenated oils.  Limit alcohol intake to no more than 1 drink a day for nonpregnant women and 2 drinks a day for men. One drink equals 12 oz of beer, 5 oz of wine, or 1 oz of hard liquor. Reading food labels  Check food labels for: ? Trans fats. ? Partially hydrogenated oils. ? Saturated fat (g) in each serving. ? Cholesterol (mg) in each serving. ? Fiber (g) in each serving.  Choose foods with healthy fats, such as: ? Monounsaturated fats. ? Polyunsaturated fats. ? Omega-3 fats.  Choose grain products that have whole grains. Look for the word "whole" as the first word in the ingredient list. Cooking  Cook foods using low-fat methods. These include baking, boiling, grilling, and broiling.  Eat more home-cooked foods. Eat at restaurants and buffets less often.  Avoid cooking using saturated fats, such as butter, cream, palm oil, palm kernel oil, and coconut oil. Recommended foods  Fruits  All fresh, canned (in natural juice), or frozen fruits. Vegetables  Fresh or frozen vegetables (raw, steamed, roasted, or grilled). Green salads. Grains  Whole grains, such as whole wheat or whole grain breads, crackers, cereals, and pasta. Unsweetened oatmeal, bulgur, barley, quinoa, or brown  rice. Corn or whole wheat flour tortillas. Meats and other protein foods  Ground beef (85% or leaner), grass-fed beef, or beef trimmed of fat. Skinless chicken or [place]. Ground chicken or [place]. Pork trimmed of fat. All fish and seafood. Egg whites. Dried beans, peas, or lentils. Unsalted nuts or seeds. Unsalted canned beans. Nut  butters without added sugar or oil. Dairy  Low-fat or nonfat dairy products, such as skim or 1% milk, 2% or reduced-fat cheeses, low-fat and fat-free ricotta or cottage cheese, or plain low-fat and nonfat yogurt. Fats and oils  Tub margarine without trans fats. Light or reduced-fat mayonnaise and salad dressings. Avocado. Olive, canola, sesame, or safflower oils. The items listed above may not be a complete list of foods and beverages you can eat. Contact a dietitian for more information. Foods to avoid Fruits  Canned fruit in heavy syrup. Fruit in cream or butter sauce. Fried fruit. Vegetables  Vegetables cooked in cheese, cream, or butter sauce. Fried vegetables. Grains  White bread. White pasta. White rice. Cornbread. Bagels, pastries, and croissants. Crackers and snack foods that contain trans fat and hydrogenated oils. Meats and other protein foods  Fatty cuts of meat. Ribs, chicken wings, bacon, sausage, bologna, salami, chitterlings, fatback, hot dogs, bratwurst, and packaged lunch meats. Liver and organ meats. Whole eggs and egg yolks. Chicken and [place] with skin. Fried meat. Dairy  Whole or 2% milk, cream, half-and-half, and cream cheese. Whole milk cheeses. Whole-fat or sweetened yogurt. Full-fat cheeses. Nondairy creamers and whipped toppings. Processed cheese, cheese spreads, and cheese curds. Beverages  Alcohol. Sugar-sweetened drinks such as sodas, lemonade, and fruit drinks. Fats and oils  Butter, stick margarine, lard, shortening, ghee, or bacon fat. Coconut, palm kernel, and palm oils. Sweets and desserts  Corn syrup, sugars, honey, and molasses. Candy. Jam and jelly. Syrup. Sweetened cereals. Cookies, pies, cakes, donuts, muffins, and ice cream. The items listed above may not be a complete list of foods and beverages you should avoid. Contact a dietitian for more information. Summary  Choosing the right foods helps keep your fat and cholesterol at normal levels.  This can keep you from getting certain diseases.  At meals, fill one-half of your plate with vegetables and green salads.  Eat high-fiber foods, like whole grains, beans, apples, carrots, peas, and barley.  Limit added sugar, saturated fats, alcohol, and fried foods. This information is not intended to replace advice given to you by your health care provider. Make sure you discuss any questions you have with your health care provider.   Low-Sodium Eating Plan Sodium, which is an element that makes up salt, helps you maintain a healthy balance of fluids in your body. Too much sodium can increase your blood pressure and cause fluid and waste to be held in your body. Your health care provider or dietitian may recommend following this plan if you have high blood pressure (hypertension), kidney disease, liver disease, or heart failure. Eating less sodium can help lower your blood pressure, reduce swelling, and protect your heart, liver, and kidneys. What are tips for following this plan? General guidelines  Most people on this plan should limit their sodium intake to 1,500-2,000 mg (milligrams) of sodium each day. Reading food labels   The Nutrition Facts label lists the amount of sodium in one serving of the food. If you eat more than one serving, you must multiply the listed amount of sodium by the number of servings.  Choose foods with less than 140 mg of sodium  per serving.  Avoid foods with 300 mg of sodium or more per serving. Shopping  Look for lower-sodium products, often labeled as "low-sodium" or "no salt added."  Always check the sodium content even if foods are labeled as "unsalted" or "no salt added".  Buy fresh foods. ? Avoid canned foods and premade or frozen meals. ? Avoid canned, cured, or processed meats  Buy breads that have less than 80 mg of sodium per slice. Cooking  Eat more home-cooked food and less restaurant, buffet, and fast food.  Avoid adding salt when  cooking. Use salt-free seasonings or herbs instead of table salt or sea salt. Check with your health care provider or pharmacist before using salt substitutes.  Cook with plant-based oils, such as canola, sunflower, or olive oil. Meal planning  When eating at a restaurant, ask that your food be prepared with less salt or no salt, if possible.  Avoid foods that contain MSG (monosodium glutamate). MSG is sometimes added to [place] food, bouillon, and some canned foods. What foods are recommended? The items listed may not be a complete list. Talk with your dietitian about what dietary choices are best for you. Grains Low-sodium cereals, including oats, puffed wheat and rice, and shredded wheat. Low-sodium crackers. Unsalted rice. Unsalted pasta. Low-sodium bread. Whole-grain breads and whole-grain pasta. Vegetables Fresh or frozen vegetables. "No salt added" canned vegetables. "No salt added" tomato sauce and paste. Low-sodium or reduced-sodium tomato and vegetable juice. Fruits Fresh, frozen, or canned fruit. Fruit juice. Meats and other protein foods Fresh or frozen (no salt added) meat, poultry, seafood, and fish. Low-sodium canned tuna and salmon. Unsalted nuts. Dried peas, beans, and lentils without added salt. Unsalted canned beans. Eggs. Unsalted nut butters. Dairy Milk. Soy milk. Cheese that is naturally low in sodium, such as ricotta cheese, fresh mozzarella, or Swiss cheese Low-sodium or reduced-sodium cheese. Cream cheese. Yogurt. Fats and oils Unsalted butter. Unsalted margarine with no trans fat. Vegetable oils such as canola or olive oils. Seasonings and other foods Fresh and dried herbs and spices. Salt-free seasonings. Low-sodium mustard and ketchup. Sodium-free salad dressing. Sodium-free light mayonnaise. Fresh or refrigerated horseradish. Lemon juice. Vinegar. Homemade, reduced-sodium, or low-sodium soups. Unsalted popcorn and pretzels. Low-salt or salt-free chips. What foods  are not recommended? The items listed may not be a complete list. Talk with your dietitian about what dietary choices are best for you. Grains Instant hot cereals. Bread stuffing, pancake, and biscuit mixes. Croutons. Seasoned rice or pasta mixes. Noodle soup cups. Boxed or frozen macaroni and cheese. Regular salted crackers. Self-rising flour. Vegetables Sauerkraut, pickled vegetables, and relishes. Olives. [place] fries. Onion rings. Regular canned vegetables (not low-sodium or reduced-sodium). Regular canned tomato sauce and paste (not low-sodium or reduced-sodium). Regular tomato and vegetable juice (not low-sodium or reduced-sodium). Frozen vegetables in sauces. Meats and other protein foods Meat or fish that is salted, canned, smoked, spiced, or pickled. Bacon, ham, sausage, hotdogs, corned beef, chipped beef, packaged lunch meats, salt pork, jerky, pickled herring, anchovies, regular canned tuna, sardines, salted nuts. Dairy Processed cheese and cheese spreads. Cheese curds. Blue cheese. Feta cheese. String cheese. Regular cottage cheese. Buttermilk. Canned milk. Fats and oils Salted butter. Regular margarine. Ghee. Bacon fat. Seasonings and other foods Onion salt, garlic salt, seasoned salt, table salt, and sea salt. Canned and packaged gravies. Worcestershire sauce. Tartar sauce. Barbecue sauce. Teriyaki sauce. Soy sauce, including reduced-sodium. Steak sauce. Fish sauce. Oyster sauce. Cocktail sauce. Horseradish that you find on the shelf. Regular ketchup and  mustard. Meat flavorings and tenderizers. Bouillon cubes. Hot sauce and Tabasco sauce. Premade or packaged marinades. Premade or packaged taco seasonings. Relishes. Regular salad dressings. Salsa. Potato and tortilla chips. Corn chips and puffs. Salted popcorn and pretzels. Canned or dried soups. Pizza. Frozen entrees and pot pies. Summary  Eating less sodium can help lower your blood pressure, reduce swelling, and protect your heart,  liver, and kidneys.  Most people on this plan should limit their sodium intake to 1,500-2,000 mg (milligrams) of sodium each day.  Canned, boxed, and frozen foods are high in sodium. Restaurant foods, fast foods, and pizza are also very high in sodium. You also get sodium by adding salt to food.  Try to cook at home, eat more fresh fruits and vegetables, and eat less fast food, canned, processed, or prepared foods. This information is not intended to replace advice given to you by your health care provider. Make sure you discuss any questions you have with your health care provider.

## 2019-05-02 ENCOUNTER — Other Ambulatory Visit: Payer: Self-pay | Admitting: Cardiology

## 2019-05-02 DIAGNOSIS — J449 Chronic obstructive pulmonary disease, unspecified: Secondary | ICD-10-CM | POA: Diagnosis not present

## 2019-05-08 NOTE — Progress Notes (Signed)
HPI: FU AFib/flutter, prior pulmonary embolism, chronic coumadin, diastolic dysfunction, morbid obesity, COPD, LE edema. Cardiac cath in 2003 was normal. Nuclear study March 2015 showed an ejection fraction 61% and normal perfusion. Seen November 2016 by Dr. Martinique and noted to be in atrial flutter. Subsequently had TEE cardioversion 11/16; TEE revealed normal LV function; mild MR; mild biatrial enlargement.Had atrial flutter ablation September 2018. Echocardiogram July 2020 showed normal LV function, moderate left ventricular hypertrophy, grade 2 diastolic dysfunction, severe left atrial enlargement, mild aortic stenosis with mean gradient 11 mmHg, trace aortic insufficiency.  Patient had repeat cardioversion April 09, 2019. Since she was last seenshe notes palpitations at night and increased dyspnea on exertion.  She denies chest pain or syncope.  No bleeding.  Current Outpatient Medications  Medication Sig Dispense Refill  . albuterol (VENTOLIN HFA) 108 (90 Base) MCG/ACT inhaler as needed.    Marland Kitchen amLODipine (NORVASC) 5 MG tablet Take 1 tablet (5 mg total) by mouth daily. 180 tablet 3  . atorvastatin (LIPITOR) 10 MG tablet Take 1 tablet (10 mg total) by mouth daily at 6 PM. 90 tablet 3  . azelastine (ASTELIN) 0.1 % nasal spray Place 2 sprays into both nostrils at bedtime as needed for rhinitis. Use in each nostril as directed 90 mL 3  . benzonatate (TESSALON) 200 MG capsule Take 1 capsule (200 mg total) by mouth 3 (three) times daily as needed for cough. 30 capsule 5  . Calcium Carbonate-Vitamin D 600-400 MG-UNIT per tablet Take 1 tablet by mouth daily.     Marland Kitchen ELIQUIS 5 MG TABS tablet TAKE 1 TABLET BY MOUTH  TWICE A DAY 180 tablet 1  . famotidine (PEPCID) 20 MG tablet Take 1 tablet (20 mg total) by mouth daily. At bedtime 90 tablet 3  . FLUoxetine (PROZAC) 40 MG capsule TAKE 1 CAPSULE BY MOUTH  DAILY 90 capsule 3  . Fluticasone-Umeclidin-Vilant (TRELEGY ELLIPTA) 100-62.5-25 MCG/INH AEPB  Inhale 1 puff into the lungs daily. 180 each 3  . furosemide (LASIX) 80 MG tablet TAKE 1 TABLET BY MOUTH IN  THE MORNING AND 1 TABLET IN THE AFTERNOON 180 tablet 3  . Glucosamine-Chondroitin (COSAMIN DS PO) Take 1 tablet by mouth daily.    Marland Kitchen KLOR-CON M20 20 MEQ tablet TAKE 1 TABLET BY MOUTH  TWICE DAILY 180 tablet 3  . levalbuterol (XOPENEX) 1.25 MG/3ML nebulizer solution Take 1.25 mg by nebulization every 4 (four) hours. 360 mL 11  . levocetirizine (XYZAL) 5 MG tablet Take 1 tablet (5 mg total) by mouth at bedtime as needed for allergies. 90 tablet 3  . levothyroxine (SYNTHROID) 50 MCG tablet Take 1 tablet (50 mcg total) by mouth daily. mylan brand 90 tablet 3  . metoprolol tartrate (LOPRESSOR) 50 MG tablet Take 1 tablet (50 mg total) by mouth 2 (two) times daily. 180 tablet 3  . olopatadine (PATANOL) 0.1 % ophthalmic solution Place 1 drop into both eyes 2 (two) times daily. 15 mL 4  . OXYGEN Inhale 2.5 L/min into the lungs continuous. And 4 with exertion    . pantoprazole (PROTONIX) 40 MG tablet TAKE 1 TABLET BY MOUTH  DAILY. TAKE 30-60 MINUTES  BEFORE FIRST MEAL OF THE  DAY 90 tablet 0  . propafenone (RYTHMOL) 150 MG tablet TAKE 1 TABLET BY MOUTH  EVERY 8 HOURS 270 tablet 3  . sodium chloride (OCEAN) 0.65 % SOLN nasal spray Place 2 sprays every 4 (four) hours as needed into both nostrils for congestion.  No current facility-administered medications for this visit.     Past Medical History:  Diagnosis Date  . Anemia    hx  . Anxiety   . Arthritis   . Asthma   . Atrial fibrillation and flutter ([place])   . Atypical lobular hyperplasia of right breast 05/09/2015  . Breast CA ([place])    ?  Marland Kitchen Chronic rhinitis   . COPD (chronic obstructive pulmonary disease) ([place])    [place]. PFTs 07/25/06 FEV1 55% ratio 53, DLC0 51% HFA 50% 12/16/2009  . Depression   . FRACTURE, RIB, RIGHT 06/18/2009  . Glucose intolerance (impaired glucose tolerance)    on steroids  . History of DVT (deep vein thrombosis)     . Hx of cardiac catheterization    LHC (01/2001):  Normal Cors.  EF 65%.;  LexiScan Myoview (03/2013): No ischemia, EF 61%, normal  . Hx of echocardiogram    Echo (11/2011):  Mod LVH, EF 60-65%, no RWMA, MAC, mild LAE, PASP 34.  Marland Kitchen HYPERLIPIDEMIA   . Hypertension   . Hypothyroidism   . Morbid obesity ([place])    target wt=179lb for BMI<30 Peak wt 282lb  . Osteoporosis    last BMD 4/08 -2.4, intolerant of bisphosphonates  . Peripheral vascular disease ([facility])    hx dvt.pe  . Pneumonia    hx  . PONV (postoperative nausea and vomiting)   . PREMATURE VENTRICULAR CONTRACTIONS   . PULMONARY EMBOLISM, HX OF 06/19/2007  . Recurrent upper respiratory infection (URI)   . Rhabdomyolysis 07/29/2009  . Tremor   . VITAMIN D DEFICIENCY     Past Surgical History:  Procedure Laterality Date  . A-FLUTTER ABLATION N/A 10/08/2016   Procedure: A-FLUTTER ABLATION;  Surgeon: Evans Lance, MD;  Location: [facility];  Service: Cardiovascular;  Laterality: N/A;  . ABDOMINAL HYSTERECTOMY    . BREAST LUMPECTOMY WITH RADIOACTIVE SEED LOCALIZATION Right 06/05/2015   Procedure: RIGHT BREAST LUMPECTOMY WITH RADIOACTIVE SEED LOCALIZATION;  Surgeon: Fanny Skates, MD;  Location: [facility];  Service: General;  Laterality: Right;  . BREAST SURGERY Left    s/p mass removal  . CARDIOVERSION N/A 12/11/2014   Procedure: CARDIOVERSION;  Surgeon: Josue Hector, MD;  Location: [facility];  Service: Cardiovascular;  Laterality: N/A;  . CARDIOVERSION N/A 09/15/2016   Procedure: CARDIOVERSION;  Surgeon: Jerline Pain, MD;  Location: [facility] ENDOSCOPY;  Service: Cardiovascular;  Laterality: N/A;  . CARDIOVERSION N/A 04/09/2019   Procedure: CARDIOVERSION;  Surgeon: Pixie Casino, MD;  Location: [facility] ENDOSCOPY;  Service: Cardiovascular;  Laterality: N/A;  . COLONOSCOPY  10/22/2003, ?2006  . COLONOSCOPY WITH PROPOFOL N/A 01/15/2016   Procedure: COLONOSCOPY WITH PROPOFOL;  Surgeon: Gatha Mayer, MD;  Location: [facility] ENDOSCOPY;   Service: Endoscopy;  Laterality: N/A;  . ESOPHAGOGASTRODUODENOSCOPY (EGD) WITH PROPOFOL N/A 01/15/2016   Procedure: ESOPHAGOGASTRODUODENOSCOPY (EGD) WITH PROPOFOL;  Surgeon: Gatha Mayer, MD;  Location: [facility] ENDOSCOPY;  Service: Endoscopy;  Laterality: N/A;  . s/p left arm fracture with fall off chair    . skin graft to middle R finger  1975  . TEE WITHOUT CARDIOVERSION N/A 12/11/2014   Procedure: TRANSESOPHAGEAL ECHOCARDIOGRAM (TEE);  Surgeon: Josue Hector, MD;  Location: [facility] ENDOSCOPY;  Service: Cardiovascular;  Laterality: N/A;  . TONSILLECTOMY      Social History   Socioeconomic History  . Marital status: Married    Spouse name: Not on file  . Number of children: 3  . Years of education: Not on file  . Highest  education level: Not on file  Occupational History  . 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 Smoking status: Former Smoker    Packs/day: 1.00    Years: 35.00    Pack years: 35.00    Types: Cigarettes    Quit date: 01/11/2006    Years since quitting: 13.3  . Smokeless tobacco: Never Used  Substance and Sexual Activity  . Alcohol use: No    Alcohol/week: 0.0 standard drinks  . Drug use: No  . Sexual activity: Yes  Other Topics Concern  . Not on file  Social History Narrative   Married 1 son 2 daughters   Disabled   2 caffeine/day   Past smoker   11/12/2015   Social Determinants of Health   Financial Resource Strain:   . Difficulty of Paying Living Expenses:   Food Insecurity:   . Worried About Charity fundraiser in the Last Year:   . Arboriculturist in the Last Year:   Transportation Needs:   . Film/video editor (Medical):   Marland Kitchen Lack of Transportation (Non-Medical):   Physical Activity:   . Days of Exercise per Week:   . Minutes of Exercise per Session:   Stress:   . Feeling of Stress :   Social Connections:   . Frequency of Communication with Friends and Family:   . Frequency of Social  Gatherings with Friends and Family:   . Attends Religious Services:   . Active Member of Clubs or Organizations:   . Attends Archivist Meetings:   Marland Kitchen Marital Status:   Intimate Partner Violence:   . Fear of Current or Ex-Partner:   . Emotionally Abused:   Marland Kitchen Physically Abused:   . Sexually Abused:     Family History  Problem Relation Age of Onset  . Heart disease Mother   . Liver disease Mother        alcohol related  . Asthma Brother   . Esophageal cancer Brother   . Asthma Son   . Colon cancer Neg Hx   . Stomach cancer Neg Hx   . Pancreatic cancer Neg Hx   . Inflammatory bowel disease Neg Hx   . Allergic rhinitis Neg Hx   . Eczema Neg Hx   . Urticaria Neg Hx     ROS: no fevers or chills, productive cough, hemoptysis, dysphasia, odynophagia, melena, hematochezia, dysuria, hematuria, rash, seizure activity, orthopnea, PND, pedal edema, claudication. Remaining systems are negative.  Physical Exam: Well-developed obese in no acute distress.  Skin is warm and dry.  HEENT is normal.  Neck is supple.  Chest diminished BS throughout Cardiovascular exam is irregular and tachycardic Abdominal exam nontender or distended. No masses palpated. Extremities show no edema. neuro grossly intact  ECG-atrial fibrillation at a rate of 116, nonspecific ST changes.  Personally reviewed  A/P  1 paroxysmal atrial fibrillation-patient back in atrial fibrillation today.  She is symptomatic with palpitations and increased dyspnea on exertion.  She has now failed propafenone.  I will discontinue this medication.  Increase metoprolol to 75 mg twice daily for improved rate control.  Continue apixaban.  I will refer to the atrial fibrillation clinic to consider initiating Tikosyn and make arrangements.  I do not think amiodarone would be the right choice for Mrs. Dion given her baseline COPD.  2 mild aortic stenosis-patient will need follow-up echocardiogram July 2021.  May be a  candidate for TAVR in  the future if necessary.  3 chronic diastolic congestive heart failure-volume status appears to be reasonable today.  We will continue present dose of diuretic.  4 hypertension-blood pressure controlled.  Continue present medications.  5 hyperlipidemia-continue statin.  6 obesity-we discussed again the importance of weight loss.  Kirk Ruths, MD

## 2019-05-10 ENCOUNTER — Encounter: Payer: Self-pay | Admitting: Cardiology

## 2019-05-10 ENCOUNTER — Ambulatory Visit: Payer: Medicare Other | Admitting: Cardiology

## 2019-05-10 ENCOUNTER — Other Ambulatory Visit: Payer: Self-pay

## 2019-05-10 VITALS — BP 120/72 | HR 116 | Ht 62.0 in | Wt 251.0 lb

## 2019-05-10 DIAGNOSIS — I350 Nonrheumatic aortic (valve) stenosis: Secondary | ICD-10-CM

## 2019-05-10 DIAGNOSIS — E782 Mixed hyperlipidemia: Secondary | ICD-10-CM | POA: Diagnosis not present

## 2019-05-10 DIAGNOSIS — I4891 Unspecified atrial fibrillation: Secondary | ICD-10-CM | POA: Diagnosis not present

## 2019-05-10 DIAGNOSIS — I10 Essential (primary) hypertension: Secondary | ICD-10-CM

## 2019-05-10 MED ORDER — METOPROLOL TARTRATE 50 MG PO TABS: 75.0000 mg | ORAL_TABLET | Freq: Two times a day (BID) | ORAL | 3 refills | Status: DC

## 2019-05-10 NOTE — Patient Instructions (Signed)
Medication Instructions:  STOP PROPATENONE  INCREASE METOPROLOL TO 75 MG TWICE DAILY= 1 AND 1/2 TABLETS TWICE DAILY  *If you need a refill on your cardiac medications before your next appointment, please call your pharmacy*   Lab Work: If you have labs (blood work) drawn today and your tests are completely normal, you will receive your results only by: Marland Kitchen MyChart Message (if you have MyChart) OR . A paper copy in the mail If you have any lab test that is abnormal or we need to change your treatment, we will call you to review the results.   Follow-Up: At [facility], you and your health needs are our priority.  As part of our continuing mission to provide you with exceptional heart care, we have created designated Provider Care Teams.  These Care Teams include your primary Cardiologist (physician) and Advanced Practice Providers (APPs -  Physician Assistants and Nurse Practitioners) who all work together to provide you with the care you need, when you need it.  We recommend signing up for the patient portal called "MyChart".  Sign up information is provided on this After Visit Summary.  MyChart is used to connect with patients for Virtual Visits (Telemedicine).  Patients are able to view lab/test results, encounter notes, upcoming appointments, etc.  Non-urgent messages can be sent to your provider as well.   To learn more about what you can do with MyChart, go to [email or web address].    Your next appointment:   3 month(s)  The format for your next appointment:   In Person  Provider:   Kirk Ruths, MD   Other Instructions FOLLOW UP WITH ATRIAL FIB CLINIC TO [place]

## 2019-05-14 ENCOUNTER — Ambulatory Visit (HOSPITAL_COMMUNITY)
Admission: RE | Admit: 2019-05-14 | Discharge: 2019-05-14 | Disposition: A | Discharge status: Home / Self Care | Payer: Medicare Other | Source: Ambulatory Visit | Attending: Physician Assistant | Admitting: Physician Assistant

## 2019-05-14 ENCOUNTER — Other Ambulatory Visit: Payer: Self-pay

## 2019-05-14 ENCOUNTER — Telehealth: Payer: Self-pay | Admitting: Pharmacist

## 2019-05-14 VITALS — BP 128/70 | HR 101 | Ht 62.0 in | Wt 249.0 lb

## 2019-05-14 DIAGNOSIS — J449 Chronic obstructive pulmonary disease, unspecified: Secondary | ICD-10-CM | POA: Insufficient documentation

## 2019-05-14 DIAGNOSIS — Z87891 Personal history of nicotine dependence: Secondary | ICD-10-CM | POA: Diagnosis not present

## 2019-05-14 DIAGNOSIS — I5032 Chronic diastolic (congestive) heart failure: Secondary | ICD-10-CM | POA: Insufficient documentation

## 2019-05-14 DIAGNOSIS — Z8379 Family history of other diseases of the digestive system: Secondary | ICD-10-CM | POA: Diagnosis not present

## 2019-05-14 DIAGNOSIS — F329 Major depressive disorder, single episode, unspecified: Secondary | ICD-10-CM | POA: Diagnosis not present

## 2019-05-14 DIAGNOSIS — F419 Anxiety disorder, unspecified: Secondary | ICD-10-CM | POA: Insufficient documentation

## 2019-05-14 DIAGNOSIS — Z79899 Other long term (current) drug therapy: Secondary | ICD-10-CM | POA: Diagnosis not present

## 2019-05-14 DIAGNOSIS — E039 Hypothyroidism, unspecified: Secondary | ICD-10-CM | POA: Diagnosis not present

## 2019-05-14 DIAGNOSIS — Z7901 Long term (current) use of anticoagulants: Secondary | ICD-10-CM | POA: Insufficient documentation

## 2019-05-14 DIAGNOSIS — Z79890 Hormone replacement therapy (postmenopausal): Secondary | ICD-10-CM | POA: Diagnosis not present

## 2019-05-14 DIAGNOSIS — I4819 Other persistent atrial fibrillation: Secondary | ICD-10-CM

## 2019-05-14 DIAGNOSIS — Z6841 Body Mass Index (BMI) 40.0 and over, adult: Secondary | ICD-10-CM | POA: Insufficient documentation

## 2019-05-14 DIAGNOSIS — Z86718 Personal history of other venous thrombosis and embolism: Secondary | ICD-10-CM | POA: Insufficient documentation

## 2019-05-14 DIAGNOSIS — Z8249 Family history of ischemic heart disease and other diseases of the circulatory system: Secondary | ICD-10-CM | POA: Diagnosis not present

## 2019-05-14 DIAGNOSIS — I4892 Unspecified atrial flutter: Secondary | ICD-10-CM | POA: Diagnosis not present

## 2019-05-14 DIAGNOSIS — Z888 Allergy status to other drugs, medicaments and biological substances status: Secondary | ICD-10-CM | POA: Insufficient documentation

## 2019-05-14 DIAGNOSIS — D649 Anemia, unspecified: Secondary | ICD-10-CM | POA: Insufficient documentation

## 2019-05-14 DIAGNOSIS — I4891 Unspecified atrial fibrillation: Secondary | ICD-10-CM

## 2019-05-14 DIAGNOSIS — Z800 Family history of malignant neoplasm of digestive organs: Secondary | ICD-10-CM | POA: Insufficient documentation

## 2019-05-14 DIAGNOSIS — E785 Hyperlipidemia, unspecified: Secondary | ICD-10-CM | POA: Insufficient documentation

## 2019-05-14 DIAGNOSIS — I110 Hypertensive heart disease with heart failure: Secondary | ICD-10-CM | POA: Diagnosis not present

## 2019-05-14 DIAGNOSIS — Z86711 Personal history of pulmonary embolism: Secondary | ICD-10-CM | POA: Insufficient documentation

## 2019-05-14 DIAGNOSIS — D6869 Other thrombophilia: Secondary | ICD-10-CM | POA: Diagnosis not present

## 2019-05-14 DIAGNOSIS — Z880 Allergy status to penicillin: Secondary | ICD-10-CM | POA: Diagnosis not present

## 2019-05-14 DIAGNOSIS — M810 Age-related osteoporosis without current pathological fracture: Secondary | ICD-10-CM | POA: Diagnosis not present

## 2019-05-14 DIAGNOSIS — Z825 Family history of asthma and other chronic lower respiratory diseases: Secondary | ICD-10-CM | POA: Insufficient documentation

## 2019-05-14 DIAGNOSIS — I480 Paroxysmal atrial fibrillation: Secondary | ICD-10-CM | POA: Diagnosis present

## 2019-05-14 LAB — MAGNESIUM: Magnesium: 2.2 mg/dL (ref 1.7–2.4)

## 2019-05-14 MED ORDER — METOPROLOL TARTRATE 100 MG PO TABS: 100.0000 mg | ORAL_TABLET | Freq: Two times a day (BID) | ORAL | 3 refills | Status: DC

## 2019-05-14 NOTE — Progress Notes (Addendum)
Primary Care Physician: McLean-Scocuzza, Nino Glow, MD Primary Cardiologist: Dr Stanford Breed Primary Electrophysiologist: Dr Lovena Le Referring Physician: Dr Hansel Starling is a 69 y.o. female with a history of paroxysmal atrial fibrillation, prior PE, atrial flutter, COPD, morbid obesity, chronic diastolic CHF, HTN who presents for consultation in the [facility]. The patient was initially diagnosed with atrial flutter in 2016 and underwent flutter ablation by Dr Lovena Le. She later developed afib and was started on propafenone. Patient is on Eliquis for a CHADS2VASC score of 4. Patient is s/p DCCV on 04/09/19 but unfortunately was back in afib on follow up with Coletta Memos and Dr Stanford Breed who recommended dofetilide. Of note, she had her second COVID shot two days prior to her afib onset. She does have symptoms of palpitations and fatigue when she is in afib.   Today, she denies symptoms of chest pain, orthopnea, PND, lower extremity edema, dizziness, presyncope, syncope, snoring, daytime somnolence, bleeding, or neurologic sequela. The patient is tolerating medications without difficulties and is otherwise without complaint today.    Atrial Fibrillation Risk Factors:  she does not have symptoms or diagnosis of sleep apnea. she does not have a history of rheumatic fever.   she has a BMI of Body mass index is 45.54 kg/m.Marland Kitchen Filed Weights   05/14/19 1509  Weight: 112.9 kg    Family History  Problem Relation Age of Onset  . Heart disease Mother   . Liver disease Mother        alcohol related  . Asthma Brother   . Esophageal cancer Brother   . Asthma Son   . Colon cancer Neg Hx   . Stomach cancer Neg Hx   . Pancreatic cancer Neg Hx   . Inflammatory bowel disease Neg Hx   . Allergic rhinitis Neg Hx   . Eczema Neg Hx   . Urticaria Neg Hx      Atrial Fibrillation Management history:  Previous antiarrhythmic drugs: propafenone  Previous  cardioversions: 2016, 2018, 04/09/19 Previous ablations: 2016 flutter CHADS2VASC score: 4 Anticoagulation history: Eliquis   Past Medical History:  Diagnosis Date  . Anemia    hx  . Anxiety   . Arthritis   . Asthma   . Atrial fibrillation and flutter ([place])   . Atypical lobular hyperplasia of right breast 05/09/2015  . Breast CA ([place])    ?  Marland Kitchen Chronic rhinitis   . COPD (chronic obstructive pulmonary disease) ([place])    [place]. PFTs 07/25/06 FEV1 55% ratio 53, DLC0 51% HFA 50% 12/16/2009  . Depression   . FRACTURE, RIB, RIGHT 06/18/2009  . Glucose intolerance (impaired glucose tolerance)    on steroids  . History of DVT (deep vein thrombosis)   . Hx of cardiac catheterization    LHC (01/2001):  Normal Cors.  EF 65%.;  LexiScan Myoview (03/2013): No ischemia, EF 61%, normal  . Hx of echocardiogram    Echo (11/2011):  Mod LVH, EF 60-65%, no RWMA, MAC, mild LAE, PASP 34.  Marland Kitchen HYPERLIPIDEMIA   . Hypertension   . Hypothyroidism   . Morbid obesity ([place])    target wt=179lb for BMI<30 Peak wt 282lb  . Osteoporosis    last BMD 4/08 -2.4, intolerant of bisphosphonates  . Peripheral vascular disease ([facility])    hx dvt.pe  . Pneumonia    hx  . PONV (postoperative nausea and vomiting)   . PREMATURE VENTRICULAR CONTRACTIONS   . PULMONARY EMBOLISM, HX OF 06/19/2007  .  Recurrent upper respiratory infection (URI)   . Rhabdomyolysis 07/29/2009  . Tremor   . VITAMIN D DEFICIENCY    Past Surgical History:  Procedure Laterality Date  . A-FLUTTER ABLATION N/A 10/08/2016   Procedure: A-FLUTTER ABLATION;  Surgeon: Evans Lance, MD;  Location: [facility];  Service: Cardiovascular;  Laterality: N/A;  . ABDOMINAL HYSTERECTOMY    . BREAST LUMPECTOMY WITH RADIOACTIVE SEED LOCALIZATION Right 06/05/2015   Procedure: RIGHT BREAST LUMPECTOMY WITH RADIOACTIVE SEED LOCALIZATION;  Surgeon: Fanny Skates, MD;  Location: [place];  Service: General;  Laterality: Right;  . BREAST SURGERY Left    s/p mass removal    . CARDIOVERSION N/A 12/11/2014   Procedure: CARDIOVERSION;  Surgeon: Josue Hector, MD;  Location: [place];  Service: Cardiovascular;  Laterality: N/A;  . CARDIOVERSION N/A 09/15/2016   Procedure: CARDIOVERSION;  Surgeon: Jerline Pain, MD;  Location: [facility] ENDOSCOPY;  Service: Cardiovascular;  Laterality: N/A;  . CARDIOVERSION N/A 04/09/2019   Procedure: CARDIOVERSION;  Surgeon: Pixie Casino, MD;  Location: [facility] ENDOSCOPY;  Service: Cardiovascular;  Laterality: N/A;  . COLONOSCOPY  10/22/2003, ?2006  . COLONOSCOPY WITH PROPOFOL N/A 01/15/2016   Procedure: COLONOSCOPY WITH PROPOFOL;  Surgeon: Gatha Mayer, MD;  Location: [facility] ENDOSCOPY;  Service: Endoscopy;  Laterality: N/A;  . ESOPHAGOGASTRODUODENOSCOPY (EGD) WITH PROPOFOL N/A 01/15/2016   Procedure: ESOPHAGOGASTRODUODENOSCOPY (EGD) WITH PROPOFOL;  Surgeon: Gatha Mayer, MD;  Location: [facility] ENDOSCOPY;  Service: Endoscopy;  Laterality: N/A;  . s/p left arm fracture with fall off chair    . skin graft to middle R finger  1975  . TEE WITHOUT CARDIOVERSION N/A 12/11/2014   Procedure: TRANSESOPHAGEAL ECHOCARDIOGRAM (TEE);  Surgeon: Josue Hector, MD;  Location: [facility] ENDOSCOPY;  Service: Cardiovascular;  Laterality: N/A;  . TONSILLECTOMY      Current Outpatient Medications  Medication Sig Dispense Refill  . albuterol (VENTOLIN HFA) 108 (90 Base) MCG/ACT inhaler Inhale 2 puffs into the lungs as needed.     Marland Kitchen amLODipine (NORVASC) 5 MG tablet Take 1 tablet (5 mg total) by mouth daily. 180 tablet 3  . atorvastatin (LIPITOR) 10 MG tablet Take 1 tablet (10 mg total) by mouth daily at 6 PM. 90 tablet 3  . azelastine (ASTELIN) 0.1 % nasal spray Place 2 sprays into both nostrils at bedtime as needed for rhinitis. Use in each nostril as directed 90 mL 3  . benzonatate (TESSALON) 200 MG capsule Take 1 capsule (200 mg total) by mouth 3 (three) times daily as needed for cough. 30 capsule 5  . Calcium Carbonate-Vitamin D 600-400 MG-UNIT per tablet Take 1  tablet by mouth daily.     Marland Kitchen ELIQUIS 5 MG TABS tablet TAKE 1 TABLET BY MOUTH  TWICE A DAY 180 tablet 1  . famotidine (PEPCID) 20 MG tablet Take 1 tablet (20 mg total) by mouth daily. At bedtime 90 tablet 3  . ferrous sulfate 325 (65 FE) MG tablet Take 325 mg by mouth 2 (two) times daily with a meal.    . FLUoxetine (PROZAC) 40 MG capsule TAKE 1 CAPSULE BY MOUTH  DAILY 90 capsule 3  . Fluticasone-Umeclidin-Vilant (TRELEGY ELLIPTA) 100-62.5-25 MCG/INH AEPB Inhale 1 puff into the lungs daily. 180 each 3  . furosemide (LASIX) 80 MG tablet TAKE 1 TABLET BY MOUTH IN  THE MORNING AND 1 TABLET IN THE AFTERNOON 180 tablet 3  . Glucosamine-Chondroitin (COSAMIN DS PO) Take 1 tablet by mouth daily.    Marland Kitchen KLOR-CON M20 20 MEQ tablet  TAKE 1 TABLET BY MOUTH  TWICE DAILY 180 tablet 3  . levalbuterol (XOPENEX) 1.25 MG/3ML nebulizer solution Take 1.25 mg by nebulization every 4 (four) hours. 360 mL 11  . levocetirizine (XYZAL) 5 MG tablet Take 1 tablet (5 mg total) by mouth at bedtime as needed for allergies. 90 tablet 3  . levothyroxine (SYNTHROID) 50 MCG tablet Take 1 tablet (50 mcg total) by mouth daily. mylan brand 90 tablet 3  . metoprolol tartrate (LOPRESSOR) 100 MG tablet Take 1 tablet (100 mg total) by mouth 2 (two) times daily. 60 tablet 3  . olopatadine (PATANOL) 0.1 % ophthalmic solution Place 1 drop into both eyes 2 (two) times daily. 15 mL 4  . OXYGEN Inhale 2.5 L/min into the lungs continuous. And 4 with exertion    . pantoprazole (PROTONIX) 40 MG tablet TAKE 1 TABLET BY MOUTH  DAILY. TAKE 30-60 MINUTES  BEFORE FIRST MEAL OF THE  DAY 90 tablet 0  . sodium chloride (OCEAN) 0.65 % SOLN nasal spray Place 2 sprays every 4 (four) hours as needed into both nostrils for congestion.     No current facility-administered medications for this encounter.    Allergies  Allergen Reactions  . Duloxetine Other (See Comments)    REACTION: rhabdomyolysis  . Penicillins Other (See Comments)    SYNCOPE Has patient  had a PCN reaction causing immediate rash, facial/tongue/throat swelling, SOB or lightheadedness with hypotension: Yes Has patient had a PCN reaction causing severe rash involving mucus membranes or skin necrosis: No Has patient had a PCN reaction that required hospitalization No Has patient had a PCN reaction occurring within the last 10 years: No If all of the above answers are "NO", then may proceed with Cephalosporin use.   . Latex Rash    Social History   Socioeconomic History  . Marital status: Married    Spouse name: Not on file  . Number of children: 3  . Years of education: Not on file  . Highest education level: Not on file  Occupational History  . 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 Smoking status: Former Smoker    Packs/day: 1.00    Years: 35.00    Pack years: 35.00    Types: Cigarettes    Quit date: 01/11/2006    Years since quitting: 13.3  . Smokeless tobacco: Never Used  Substance and Sexual Activity  . Alcohol use: No    Alcohol/week: 0.0 standard drinks  . Drug use: No  . Sexual activity: Yes  Other Topics Concern  . Not on file  Social History Narrative   Married 1 son 2 daughters   Disabled   2 caffeine/day   Past smoker   11/12/2015   Social Determinants of Health   Financial Resource Strain:   . Difficulty of Paying Living Expenses:   Food Insecurity:   . Worried About Charity fundraiser in the Last Year:   . Arboriculturist in the Last Year:   Transportation Needs:   . Film/video editor (Medical):   Marland Kitchen Lack of Transportation (Non-Medical):   Physical Activity:   . Days of Exercise per Week:   . Minutes of Exercise per Session:   Stress:   . Feeling of Stress :   Social Connections:   . Frequency of Communication with Friends and Family:   . Frequency of Social Gatherings with Friends and Family:   . Attends Religious Services:   .  Active Member of Clubs or Organizations:   .  Attends Archivist Meetings:   Marland Kitchen Marital Status:   Intimate Partner Violence:   . Fear of Current or Ex-Partner:   . Emotionally Abused:   Marland Kitchen Physically Abused:   . Sexually Abused:      ROS- All systems are reviewed and negative except as per the HPI above.  Physical Exam: Vitals:   05/14/19 1509  BP: 128/70  Pulse: (!) 101  Weight: 112.9 kg  Height: _0  (1.575 m)    GEN- The patient is well appearing obese female, alert and oriented x 3 today.   Head- normocephalic, atraumatic Eyes-  Sclera clear, conjunctiva pink Ears- hearing intact Oropharynx- clear Neck- supple  Lungs- Clear to ausculation bilaterally, diminished breath sounds, normal work of breathing Heart- irregular rate and rhythm, no murmurs, rubs or gallops  GI- soft, NT, ND, + BS Extremities- no clubbing, cyanosis, or edema MS- no significant deformity or atrophy Skin- no rash or lesion Psych- euthymic mood, full affect Neuro- strength and sensation are intact  Wt Readings from Last 3 Encounters:  05/14/19 112.9 kg  05/10/19 113.9 kg  04/25/19 112.5 kg    EKG today demonstrates afib HR 101, NST, QRS 82, QTc 414  Echo 07/17/18 demonstrated  1. The left ventricle has normal systolic function with an ejection  fraction of 60-65%. The cavity size was normal. There is moderately  increased left ventricular wall thickness. Left ventricular diastolic  Doppler parameters are consistent with  pseudonormalization. Elevated left ventricular end-diastolic pressure The E/e' is >20.  2. The right ventricle has normal systolic function. The cavity was  normal. There is no increase in right ventricular wall thickness. Right  ventricular systolic pressure is mildly elevated with an estimated  pressure of 32.2 mmHg.  3. Left atrial size was severely dilated.  4. The aortic valve is abnormal. Mild calcification of the aortic valve.  Aortic valve regurgitation is trivial by color flow Doppler. Mild  stenosis  of the aortic valve. Mean gradient 11 mmHg. Difference in mean gradients between exams may be secondary  to increased flow velocities across LVOT on prior study.  5. The mitral valve is abnormal. There is moderate to severe mitral  annular calcification present. Mean diastolic gradient 3 mmHg at HR 64  bpm.   Epic records are reviewed at length today  CHA2DS2-VASc Score = 4  The patient's score is based upon: CHF History: 0 HTN History: 1 Age : 1 Diabetes History: 0 Stroke History: 0 Vascular Disease History: 1 Gender: 1      ASSESSMENT AND PLAN: 1. Persistent Atrial Fibrillation/atrial flutter The patient's CHA2DS2-VASc score is 4, indicating a 4.8% annual risk of stroke.   Patient wants to pursue dofetilide, aware of risk vrs benefit Patient will check on price of medication.  PharmD to screen drugs for QT prolonging drugs. QTc in SR 437 ms Continue Eliquis 5 mg BID Increase Lopressor to 100 mg BID. Patient to track HR and BP and call with update later this week.  Check magnesium  Secondary Hypercoagulable State (ICD10:  D68.69) The patient is at significant risk for stroke/thromboembolism based upon her CHA2DS2-VASc Score of 4.  Continue Apixaban (Eliquis).   3. Obesity Body mass index is 45.54 kg/m. Lifestyle modification was discussed at length including regular exercise and weight reduction.  4. Chronic diastolic CHF Weight stable, no signs or symptoms of fluid overload today.  5. HTN Stable, med changes as above.  Follow up for dofetilide admission June 1st.    Adline Peals PA-C [facility] [address] [place], [place] [address] [phone] 05/14/2019 3:44 PM

## 2019-05-14 NOTE — Telephone Encounter (Signed)
Medication list reviewed in anticipation of upcoming Tikosyn initiation. Patient is taking fluoxetine 40mg  and uses Trelegy and Xopenex inhalers, all of which can cause QTc prolongation. None are completely contraindicated with Tikosyn, however will need to monitor QTc very closely during Tikosyn initiation.  Patient is anticoagulated on Eliquis 5mg  BID on the appropriate dose. Please ensure that patient has not missed any anticoagulation doses in the 3 weeks prior to Tikosyn initiation.   Patient will need to be counseled to avoid use of Benadryl while on Tikosyn and in the 2-3 days prior to Tikosyn initiation.

## 2019-05-14 NOTE — Patient Instructions (Signed)
Increase metoprolol to 100 mg twice a day.   

## 2019-05-21 ENCOUNTER — Other Ambulatory Visit: Payer: Self-pay

## 2019-05-21 ENCOUNTER — Ambulatory Visit: Payer: Medicare Other | Admitting: Pulmonary Disease

## 2019-05-21 ENCOUNTER — Encounter: Payer: Self-pay | Admitting: Pulmonary Disease

## 2019-05-21 ENCOUNTER — Telehealth: Payer: Self-pay | Admitting: Pulmonary Disease

## 2019-05-21 VITALS — BP 128/80 | HR 122 | Temp 98.0°F | Ht 62.0 in | Wt 248.4 lb

## 2019-05-21 DIAGNOSIS — Z87891 Personal history of nicotine dependence: Secondary | ICD-10-CM | POA: Diagnosis not present

## 2019-05-21 DIAGNOSIS — J449 Chronic obstructive pulmonary disease, unspecified: Secondary | ICD-10-CM | POA: Diagnosis not present

## 2019-05-21 DIAGNOSIS — J9612 Chronic respiratory failure with hypercapnia: Secondary | ICD-10-CM

## 2019-05-21 DIAGNOSIS — J9611 Chronic respiratory failure with hypoxia: Secondary | ICD-10-CM | POA: Diagnosis not present

## 2019-05-21 DIAGNOSIS — Z86711 Personal history of pulmonary embolism: Secondary | ICD-10-CM | POA: Diagnosis not present

## 2019-05-21 DIAGNOSIS — I5032 Chronic diastolic (congestive) heart failure: Secondary | ICD-10-CM

## 2019-05-21 MED ORDER — TRELEGY ELLIPTA 100-62.5-25 MCG/INH IN AEPB: 1.0000 | INHALATION_SPRAY | Freq: Every day | RESPIRATORY_TRACT | 0 refills | Status: DC

## 2019-05-21 NOTE — Telephone Encounter (Signed)
Called and spoke with pt letting her know the info stated by pharmacy team. Pt stated she was going to go ahead and see if she could be qualified for patient assistance.nothing further needed.

## 2019-05-21 NOTE — Telephone Encounter (Signed)
She just purchased 3 month supply for $580 so she is really close  Thanks Garner Nash, [facility] 05/21/2019 5:16 PM

## 2019-05-21 NOTE — Telephone Encounter (Signed)
Dr. Valeta Harms, please advise if you want to keep her on trelegy or see if she is willing to try [facility]

## 2019-05-21 NOTE — Telephone Encounter (Signed)
Pt was seen today 05/21/19 by Dr. Valeta Harms for a follow up. She is currently on Trelegy 100 which she states is working well for her but pt states the cost is too high. Pt is also currently in the donut hole. Dr. Valeta Harms and pt were wondering if there might be any type of patient assistance that she may be able to qualify for.  Rachael/Amber, can you look into this for [place] please.

## 2019-05-21 NOTE — Progress Notes (Signed)
Synopsis: Referred in Dec 2020 to est care with new pulmonary provider, former patient Dr. Alva Garnet by McLean-Scocuzza, Olivia Mackie *  Subjective:   PATIENT ID: [name] [name] GENDER: female DOB: 10/11/1950, MRN: [id number]  Chief Complaint  Patient presents with  . Follow-up    Pt states she believes her breathing has become worse since last visit.     PMH afib, copd FEV1 55%, morbidily obese, HTN. Currently managed on trelegy inhaler.  Overall doing well.  She has seen Dr. Melvyn Novas in the past as well as Dr. Jamal Collin.  Dr. Jamal Collin recently relocated and she is here to establish care with new pulmonary provider.  She has chronic respiratory failure on 2 L POC.  At this time doing well she did have a new error message on her POC.  We talked about calling her DME supplier to have them come out and check this.  Is currently not showing the error message today in the office.  She is using her Trelegy inhaler regularly.  This past year she has not even needed to use her rescue inhaler.  Her breathing is doing much better.  She has also been trying to lose weight and she has been successful in this.  She does take a vitamin D supplement daily.  OV 05/21/2019: Patient seen today in clinic for follow-up regarding COPD.  She also has chronic oxygen dependent respiratory failure.  She has a new POC that she really likes.  She wishes the battery would work better.  She is having some difficulty affording her medications.  She is currently in the donut hole with her Medicare plan.  She does not really like how the Trelegy is working.  She breathes so much better while using it.  She was she had been on it for a long time.  She hates to not be able to afford it but at this point she is concerned.    Past Medical History:  Diagnosis Date  . Anemia    hx  . Anxiety   . Arthritis   . Asthma   . Atrial fibrillation and flutter ([place])   . Atypical lobular hyperplasia of right breast 05/09/2015  . Breast CA ([place])    ?    Marland Kitchen Chronic rhinitis   . COPD (chronic obstructive pulmonary disease) ([place])    [place]. PFTs 07/25/06 FEV1 55% ratio 53, DLC0 51% HFA 50% 12/16/2009  . Depression   . FRACTURE, RIB, RIGHT 06/18/2009  . Glucose intolerance (impaired glucose tolerance)    on steroids  . History of DVT (deep vein thrombosis)   . Hx of cardiac catheterization    LHC (01/2001):  Normal Cors.  EF 65%.;  LexiScan Myoview (03/2013): No ischemia, EF 61%, normal  . Hx of echocardiogram    Echo (11/2011):  Mod LVH, EF 60-65%, no RWMA, MAC, mild LAE, PASP 34.  Marland Kitchen HYPERLIPIDEMIA   . Hypertension   . Hypothyroidism   . Morbid obesity ([place])    target wt=179lb for BMI<30 Peak wt 282lb  . Osteoporosis    last BMD 4/08 -2.4, intolerant of bisphosphonates  . Peripheral vascular disease (H[facility])    hx dvt.pe  . Pneumonia    hx  . PONV (postoperative nausea and vomiting)   . PREMATURE VENTRICULAR CONTRACTIONS   . PULMONARY EMBOLISM, HX OF 06/19/2007  . Recurrent upper respiratory infection (URI)   . Rhabdomyolysis 07/29/2009  . Tremor   . VITAMIN D DEFICIENCY      Family History  Problem Relation Age of Onset  . Heart disease Mother   . Liver disease Mother        alcohol related  . Asthma Brother   . Esophageal cancer Brother   . Asthma Son   . Colon cancer Neg Hx   . Stomach cancer Neg Hx   . Pancreatic cancer Neg Hx   . Inflammatory bowel disease Neg Hx   . Allergic rhinitis Neg Hx   . Eczema Neg Hx   . Urticaria Neg Hx      Past Surgical History:  Procedure Laterality Date  . A-FLUTTER ABLATION N/A 10/08/2016   Procedure: A-FLUTTER ABLATION;  Surgeon: Evans Lance, MD;  Location: [facility];  Service: Cardiovascular;  Laterality: N/A;  . ABDOMINAL HYSTERECTOMY    . BREAST LUMPECTOMY WITH RADIOACTIVE SEED LOCALIZATION Right 06/05/2015   Procedure: RIGHT BREAST LUMPECTOMY WITH RADIOACTIVE SEED LOCALIZATION;  Surgeon: Fanny Skates, MD;  Location: [facility];  Service: General;  Laterality: Right;  . BREAST  SURGERY Left    s/p mass removal  . CARDIOVERSION N/A 12/11/2014   Procedure: CARDIOVERSION;  Surgeon: Josue Hector, MD;  Location: [facility];  Service: Cardiovascular;  Laterality: N/A;  . CARDIOVERSION N/A 09/15/2016   Procedure: CARDIOVERSION;  Surgeon: Jerline Pain, MD;  Location: [facility];  Service: Cardiovascular;  Laterality: N/A;  . CARDIOVERSION N/A 04/09/2019   Procedure: CARDIOVERSION;  Surgeon: Pixie Casino, MD;  Location: [facility];  Service: Cardiovascular;  Laterality: N/A;  . COLONOSCOPY  10/22/2003, ?2006  . COLONOSCOPY WITH PROPOFOL N/A 01/15/2016   Procedure: COLONOSCOPY WITH PROPOFOL;  Surgeon: Gatha Mayer, MD;  Location: [facility];  Service: Endoscopy;  Laterality: N/A;  . ESOPHAGOGASTRODUODENOSCOPY (EGD) WITH PROPOFOL N/A 01/15/2016   Procedure: ESOPHAGOGASTRODUODENOSCOPY (EGD) WITH PROPOFOL;  Surgeon: Gatha Mayer, MD;  Location: [facility];  Service: Endoscopy;  Laterality: N/A;  . s/p left arm fracture with fall off chair    . skin graft to middle R finger  1975  . TEE WITHOUT CARDIOVERSION N/A 12/11/2014   Procedure: TRANSESOPHAGEAL ECHOCARDIOGRAM (TEE);  Surgeon: Josue Hector, MD;  Location: [facility] ENDOSCOPY;  Service: Cardiovascular;  Laterality: N/A;  . TONSILLECTOMY      Social History   Socioeconomic History  . Marital status: Married    Spouse name: Not on file  . Number of children: 3  . Years of education: Not on file  . Highest education level: Not on file  Occupational History  . 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 Smoking status: Former Smoker    Packs/day: 1.00    Years: 35.00    Pack years: 35.00    Types: Cigarettes    Quit date: 01/11/2006    Years since quitting: 13.3  . Smokeless tobacco: Never Used  Substance and Sexual Activity  . Alcohol use: No    Alcohol/week: 0.0 standard drinks  . Drug use: No  . Sexual activity: Yes  Other Topics Concern  . Not on  file  Social History Narrative   Married 1 son 2 daughters   Disabled   2 caffeine/day   Past smoker   11/12/2015   Social Determinants of Health   Financial Resource Strain:   . Difficulty of Paying Living Expenses:   Food Insecurity:   . Worried About Charity fundraiser in the Last Year:   . Arboriculturist in the Last Year:   [employer]  Needs:   . Lack of Transportation (Medical):   Marland Kitchen Lack of Transportation (Non-Medical):   Physical Activity:   . Days of Exercise per Week:   . Minutes of Exercise per Session:   Stress:   . Feeling of Stress :   Social Connections:   . Frequency of Communication with Friends and Family:   . Frequency of Social Gatherings with Friends and Family:   . Attends Religious Services:   . Active Member of Clubs or Organizations:   . Attends Archivist Meetings:   Marland Kitchen Marital Status:   Intimate Partner Violence:   . Fear of Current or Ex-Partner:   . Emotionally Abused:   Marland Kitchen Physically Abused:   . Sexually Abused:      Allergies  Allergen Reactions  . Duloxetine Other (See Comments)    REACTION: rhabdomyolysis  . Penicillins Other (See Comments)    SYNCOPE Has patient had a PCN reaction causing immediate rash, facial/tongue/throat swelling, SOB or lightheadedness with hypotension: Yes Has patient had a PCN reaction causing severe rash involving mucus membranes or skin necrosis: No Has patient had a PCN reaction that required hospitalization No Has patient had a PCN reaction occurring within the last 10 years: No If all of the above answers are "NO", then may proceed with Cephalosporin use.   . Latex Rash     Outpatient Medications Prior to Visit  Medication Sig Dispense Refill  . albuterol (VENTOLIN HFA) 108 (90 Base) MCG/ACT inhaler Inhale 2 puffs into the lungs as needed.     Marland Kitchen amLODipine (NORVASC) 5 MG tablet Take 1 tablet (5 mg total) by mouth daily. 180 tablet 3  . atorvastatin (LIPITOR) 10 MG tablet Take 1 tablet (10  mg total) by mouth daily at 6 PM. 90 tablet 3  . azelastine (ASTELIN) 0.1 % nasal spray Place 2 sprays into both nostrils at bedtime as needed for rhinitis. Use in each nostril as directed 90 mL 3  . benzonatate (TESSALON) 200 MG capsule Take 1 capsule (200 mg total) by mouth 3 (three) times daily as needed for cough. 30 capsule 5  . Calcium Carbonate-Vitamin D 600-400 MG-UNIT per tablet Take 1 tablet by mouth daily.     Marland Kitchen ELIQUIS 5 MG TABS tablet TAKE 1 TABLET BY MOUTH  TWICE A DAY 180 tablet 1  . famotidine (PEPCID) 20 MG tablet Take 1 tablet (20 mg total) by mouth daily. At bedtime 90 tablet 3  . ferrous sulfate 325 (65 FE) MG tablet Take 325 mg by mouth 2 (two) times daily with a meal.    . FLUoxetine (PROZAC) 40 MG capsule TAKE 1 CAPSULE BY MOUTH  DAILY 90 capsule 3  . Fluticasone-Umeclidin-Vilant (TRELEGY ELLIPTA) 100-62.5-25 MCG/INH AEPB Inhale 1 puff into the lungs daily. 180 each 3  . furosemide (LASIX) 80 MG tablet TAKE 1 TABLET BY MOUTH IN  THE MORNING AND 1 TABLET IN THE AFTERNOON 180 tablet 3  . Glucosamine-Chondroitin (COSAMIN DS PO) Take 1 tablet by mouth daily.    Marland Kitchen KLOR-CON M20 20 MEQ tablet TAKE 1 TABLET BY MOUTH  TWICE DAILY 180 tablet 3  . levalbuterol (XOPENEX) 1.25 MG/3ML nebulizer solution Take 1.25 mg by nebulization every 4 (four) hours. 360 mL 11  . levocetirizine (XYZAL) 5 MG tablet Take 1 tablet (5 mg total) by mouth at bedtime as needed for allergies. 90 tablet 3  . levothyroxine (SYNTHROID) 50 MCG tablet Take 1 tablet (50 mcg total) by mouth daily. mylan brand 90 tablet  3  . metoprolol tartrate (LOPRESSOR) 100 MG tablet Take 1 tablet (100 mg total) by mouth 2 (two) times daily. 60 tablet 3  . olopatadine (PATANOL) 0.1 % ophthalmic solution Place 1 drop into both eyes 2 (two) times daily. 15 mL 4  . OXYGEN Inhale 2.5 L/min into the lungs continuous. And 4 with exertion    . sodium chloride (OCEAN) 0.65 % SOLN nasal spray Place 2 sprays every 4 (four) hours as needed  into both nostrils for congestion.    . pantoprazole (PROTONIX) 40 MG tablet TAKE 1 TABLET BY MOUTH  DAILY. TAKE 30-60 MINUTES  BEFORE FIRST MEAL OF THE  DAY 90 tablet 0   No facility-administered medications prior to visit.    Review of Systems  Constitutional: Negative for chills, fever, malaise/fatigue and weight loss.  HENT: Negative for hearing loss, sore throat and tinnitus.   Eyes: Negative for blurred vision and double vision.  Respiratory: Positive for shortness of breath. Negative for cough, hemoptysis, sputum production, wheezing and stridor.   Cardiovascular: Negative for chest pain, palpitations, orthopnea, leg swelling and PND.  Gastrointestinal: Negative for abdominal pain, constipation, diarrhea, heartburn, nausea and vomiting.  Genitourinary: Negative for dysuria, hematuria and urgency.  Musculoskeletal: Negative for joint pain and myalgias.  Skin: Negative for itching and rash.  Neurological: Negative for dizziness, tingling, weakness and headaches.  Endo/Heme/Allergies: Negative for environmental allergies. Does not bruise/bleed easily.  Psychiatric/Behavioral: Negative for depression. The patient is not nervous/anxious and does not have insomnia.   All other systems reviewed and are negative.    Objective:  Physical Exam Vitals reviewed.  Constitutional:      General: She is not in acute distress.    Appearance: She is well-developed.  HENT:     Head: Normocephalic and atraumatic.     Mouth/Throat:     Pharynx: No oropharyngeal exudate.  Eyes:     Conjunctiva/sclera: Conjunctivae normal.     Pupils: Pupils are equal, round, and reactive to light.  Neck:     Vascular: No JVD.     Trachea: No tracheal deviation.     Comments: Loss of supraclavicular fat Cardiovascular:     Rate and Rhythm: Normal rate and regular rhythm.     Heart sounds: S1 normal and S2 normal.     Comments: Distant heart tones Pulmonary:     Effort: No tachypnea or accessory muscle  usage.     Breath sounds: No stridor. Decreased breath sounds (throughout all lung fields) present. No wheezing, rhonchi or rales.  Abdominal:     General: Bowel sounds are normal. There is no distension.     Palpations: Abdomen is soft.     Tenderness: There is no abdominal tenderness.  Musculoskeletal:        General: No deformity (muscle wasting ).  Skin:    General: Skin is warm and dry.     Capillary Refill: Capillary refill takes less than 2 seconds.     Findings: No rash.  Neurological:     Mental Status: She is alert and oriented to person, place, and time.  Psychiatric:        Behavior: Behavior normal.      Vitals:   05/21/19 1501  BP: 128/80  Pulse: (!) 122  Temp: 98 F (36.7 C)  TempSrc: Temporal  SpO2: 97%  Weight: 248 lb 6.4 oz (112.7 kg)  Height: _0  (1.575 m)   97% on 2 L/min BMI Readings from Last 3 Encounters:  05/21/19  45.43 kg/m  05/14/19 45.54 kg/m  05/10/19 45.91 kg/m   Wt Readings from Last 3 Encounters:  05/21/19 248 lb 6.4 oz (112.7 kg)  05/14/19 249 lb (112.9 kg)  05/10/19 251 lb (113.9 kg)     CBC    Component Value Date/Time   WBC 9.0 04/02/2019 1517   WBC 6.1 03/09/2018 0949   RBC 3.92 04/02/2019 1517   RBC 3.66 (L) 03/09/2018 0949   HGB 11.1 04/02/2019 1517   HCT 34.5 04/02/2019 1517   PLT 296 04/02/2019 1517   MCV 88 04/02/2019 1517   MCH 28.3 04/02/2019 1517   MCH 29.5 10/07/2017 1126   MCHC 32.2 04/02/2019 1517   MCHC 34.6 03/09/2018 0949   RDW 15.7 (H) 04/02/2019 1517   LYMPHSABS 1.8 03/09/2018 0949   MONOABS 0.4 03/09/2018 0949   EOSABS 0.1 03/09/2018 0949   BASOSABS 0.0 03/09/2018 0949    Chest Imaging: 03/23/2018: Ct Chest  - IMPRESSION: 1. Enlarged heart. Calcific atherosclerotic disease of the aorta and coronary arteries. Annular calcifications of the aortic valve. 2. Enlargement of the main pulmonary trunk, which may be seen with pulmonary arterial hypertension. 3. Mild hepatic steatosis.  Pulmonary  Functions Testing Results: No flowsheet data found.  FeNO: None   Pathology: None   Echocardiogram:  07/2018: presevered EF 60-65%, elevated left ventricular EDP  Heart Catheterization: None      Assessment & Plan:      ICD-10-CM   1. Moderate COPD (chronic obstructive pulmonary disease) (HCC)  J44.9   2. Chronic respiratory failure with hypoxia and hypercapnia (HCC)  J96.11    J96.12   3. History of pulmonary embolism  Z86.711   4. Former smoker  Z87.891   5. Morbid obesity ([place])  E66.01   6. Chronic heart failure with preserved ejection fraction (HCC)  I50.32     Discussion:  69 year old morbidly obese female history of moderate COPD former smoker.  Currently on triple therapy inhaler with Trelegy.  She has history of atrial fibrillation on anticoagulation plans to start Tikosyn soon.  Also has history of pulmonary embolism.  Plan: Continue Trelegy inhaler Call for additional refills if needed. Continue albuterol as needed for shortness of breath and wheezing. Continue to exercise and stay mobile. Weight loss also would be very important.  RTC in 6 months as needed.    Current Outpatient Medications:  .  albuterol (VENTOLIN HFA) 108 (90 Base) MCG/ACT inhaler, Inhale 2 puffs into the lungs as needed. , Disp: , Rfl:  .  amLODipine (NORVASC) 5 MG tablet, Take 1 tablet (5 mg total) by mouth daily., Disp: 180 tablet, Rfl: 3 .  atorvastatin (LIPITOR) 10 MG tablet, Take 1 tablet (10 mg total) by mouth daily at 6 PM., Disp: 90 tablet, Rfl: 3 .  azelastine (ASTELIN) 0.1 % nasal spray, Place 2 sprays into both nostrils at bedtime as needed for rhinitis. Use in each nostril as directed, Disp: 90 mL, Rfl: 3 .  benzonatate (TESSALON) 200 MG capsule, Take 1 capsule (200 mg total) by mouth 3 (three) times daily as needed for cough., Disp: 30 capsule, Rfl: 5 .  Calcium Carbonate-Vitamin D 600-400 MG-UNIT per tablet, Take 1 tablet by mouth daily. , Disp: , Rfl:  .  ELIQUIS 5 MG TABS  tablet, TAKE 1 TABLET BY MOUTH  TWICE A DAY, Disp: 180 tablet, Rfl: 1 .  famotidine (PEPCID) 20 MG tablet, Take 1 tablet (20 mg total) by mouth daily. At bedtime, Disp: 90 tablet, Rfl: 3 .  ferrous sulfate 325 (65 FE) MG tablet, Take 325 mg by mouth 2 (two) times daily with a meal., Disp: , Rfl:  .  FLUoxetine (PROZAC) 40 MG capsule, TAKE 1 CAPSULE BY MOUTH  DAILY, Disp: 90 capsule, Rfl: 3 .  Fluticasone-Umeclidin-Vilant (TRELEGY ELLIPTA) 100-62.5-25 MCG/INH AEPB, Inhale 1 puff into the lungs daily., Disp: 180 each, Rfl: 3 .  furosemide (LASIX) 80 MG tablet, TAKE 1 TABLET BY MOUTH IN  THE MORNING AND 1 TABLET IN THE AFTERNOON, Disp: 180 tablet, Rfl: 3 .  Glucosamine-Chondroitin (COSAMIN DS PO), Take 1 tablet by mouth daily., Disp: , Rfl:  .  KLOR-CON M20 20 MEQ tablet, TAKE 1 TABLET BY MOUTH  TWICE DAILY, Disp: 180 tablet, Rfl: 3 .  levalbuterol (XOPENEX) 1.25 MG/3ML nebulizer solution, Take 1.25 mg by nebulization every 4 (four) hours., Disp: 360 mL, Rfl: 11 .  levocetirizine (XYZAL) 5 MG tablet, Take 1 tablet (5 mg total) by mouth at bedtime as needed for allergies., Disp: 90 tablet, Rfl: 3 .  levothyroxine (SYNTHROID) 50 MCG tablet, Take 1 tablet (50 mcg total) by mouth daily. mylan brand, Disp: 90 tablet, Rfl: 3 .  metoprolol tartrate (LOPRESSOR) 100 MG tablet, Take 1 tablet (100 mg total) by mouth 2 (two) times daily., Disp: 60 tablet, Rfl: 3 .  olopatadine (PATANOL) 0.1 % ophthalmic solution, Place 1 drop into both eyes 2 (two) times daily., Disp: 15 mL, Rfl: 4 .  OXYGEN, Inhale 2.5 L/min into the lungs continuous. And 4 with exertion, Disp: , Rfl:  .  sodium chloride (OCEAN) 0.65 % SOLN nasal spray, Place 2 sprays every 4 (four) hours as needed into both nostrils for congestion., Disp: , Rfl:    Garner Nash, DO [facility] 05/21/2019 3:08 PM

## 2019-05-21 NOTE — Patient Instructions (Signed)
Thank you for visiting Dr. Valeta Harms at [facility]. Today we recommend the following:  Trelegy samples today   Return in about 6 months (around 11/21/2019).    Please do your part to reduce the spread of COVID-19.

## 2019-05-21 NOTE — Addendum Note (Signed)
Addended by: Lorretta Harp on: 05/21/2019 03:41 PM   Modules accepted: Orders

## 2019-05-21 NOTE — Telephone Encounter (Signed)
[employer] is the patient assistance for Trelegy, but patient will need to have meet their income guidelines and proof she spent $600 on prescriptions in 2021.   An alternative is if MD is open to switching to [facility], [place] patient [place] currently no longer requires patient to have proof of out of pocket spending, she would just need to meet the income guidelines.

## 2019-05-30 ENCOUNTER — Other Ambulatory Visit: Payer: Self-pay

## 2019-06-01 ENCOUNTER — Other Ambulatory Visit: Payer: Self-pay | Admitting: Internal Medicine

## 2019-06-01 DIAGNOSIS — J449 Chronic obstructive pulmonary disease, unspecified: Secondary | ICD-10-CM | POA: Diagnosis not present

## 2019-06-01 MED ORDER — FLUOXETINE HCL 40 MG PO CAPS: 40.0000 mg | ORAL_CAPSULE | Freq: Every day | ORAL | 3 refills | Status: AC

## 2019-06-08 ENCOUNTER — Other Ambulatory Visit (HOSPITAL_COMMUNITY)
Admission: RE | Admit: 2019-06-08 | Discharge: 2019-06-08 | Disposition: A | Discharge status: Home / Self Care | Payer: Medicare Other | Source: Ambulatory Visit | Attending: Internal Medicine | Admitting: Internal Medicine

## 2019-06-08 DIAGNOSIS — Z01812 Encounter for preprocedural laboratory examination: Secondary | ICD-10-CM | POA: Insufficient documentation

## 2019-06-08 DIAGNOSIS — Z20822 Contact with and (suspected) exposure to covid-19: Secondary | ICD-10-CM | POA: Diagnosis not present

## 2019-06-09 LAB — SARS CORONAVIRUS 2 (TAT 6-24 HRS): SARS Coronavirus 2: NEGATIVE

## 2019-06-12 ENCOUNTER — Other Ambulatory Visit: Payer: Self-pay

## 2019-06-12 ENCOUNTER — Encounter (HOSPITAL_COMMUNITY): Payer: Self-pay | Admitting: Physician Assistant

## 2019-06-12 ENCOUNTER — Encounter (HOSPITAL_COMMUNITY): Payer: Self-pay | Admitting: Internal Medicine

## 2019-06-12 ENCOUNTER — Ambulatory Visit (HOSPITAL_COMMUNITY)
Admission: RE | Admit: 2019-06-12 | Discharge: 2019-06-12 | Disposition: A | Discharge status: Home / Self Care | Payer: Medicare Other | Source: Ambulatory Visit | Attending: Physician Assistant | Admitting: Physician Assistant

## 2019-06-12 ENCOUNTER — Inpatient Hospital Stay (HOSPITAL_COMMUNITY)
Admission: RE | Admit: 2019-06-12 | Discharge: 2019-06-15 | DRG: 309 | Disposition: A | Discharge status: Home / Self Care | Payer: Medicare Other | Source: Ambulatory Visit | Attending: Internal Medicine | Admitting: Internal Medicine

## 2019-06-12 VITALS — BP 118/70 | HR 98 | Ht 62.0 in | Wt 248.0 lb

## 2019-06-12 DIAGNOSIS — I4891 Unspecified atrial fibrillation: Secondary | ICD-10-CM

## 2019-06-12 DIAGNOSIS — J449 Chronic obstructive pulmonary disease, unspecified: Secondary | ICD-10-CM | POA: Diagnosis present

## 2019-06-12 DIAGNOSIS — Z9981 Dependence on supplemental oxygen: Secondary | ICD-10-CM

## 2019-06-12 DIAGNOSIS — Z6841 Body Mass Index (BMI) 40.0 and over, adult: Secondary | ICD-10-CM

## 2019-06-12 DIAGNOSIS — Z79891 Long term (current) use of opiate analgesic: Secondary | ICD-10-CM

## 2019-06-12 DIAGNOSIS — I5032 Chronic diastolic (congestive) heart failure: Secondary | ICD-10-CM | POA: Diagnosis not present

## 2019-06-12 DIAGNOSIS — I4819 Other persistent atrial fibrillation: Principal | ICD-10-CM | POA: Diagnosis present

## 2019-06-12 DIAGNOSIS — Z7901 Long term (current) use of anticoagulants: Secondary | ICD-10-CM | POA: Diagnosis not present

## 2019-06-12 DIAGNOSIS — D6869 Other thrombophilia: Secondary | ICD-10-CM

## 2019-06-12 DIAGNOSIS — R001 Bradycardia, unspecified: Secondary | ICD-10-CM | POA: Diagnosis present

## 2019-06-12 DIAGNOSIS — I110 Hypertensive heart disease with heart failure: Secondary | ICD-10-CM | POA: Diagnosis not present

## 2019-06-12 DIAGNOSIS — E039 Hypothyroidism, unspecified: Secondary | ICD-10-CM | POA: Diagnosis present

## 2019-06-12 DIAGNOSIS — I10 Essential (primary) hypertension: Secondary | ICD-10-CM | POA: Diagnosis not present

## 2019-06-12 DIAGNOSIS — Z79899 Other long term (current) drug therapy: Secondary | ICD-10-CM | POA: Diagnosis not present

## 2019-06-12 DIAGNOSIS — I480 Paroxysmal atrial fibrillation: Secondary | ICD-10-CM | POA: Diagnosis not present

## 2019-06-12 DIAGNOSIS — E785 Hyperlipidemia, unspecified: Secondary | ICD-10-CM | POA: Diagnosis present

## 2019-06-12 DIAGNOSIS — Z79890 Hormone replacement therapy (postmenopausal): Secondary | ICD-10-CM | POA: Diagnosis not present

## 2019-06-12 DIAGNOSIS — Z86711 Personal history of pulmonary embolism: Secondary | ICD-10-CM

## 2019-06-12 DIAGNOSIS — J441 Chronic obstructive pulmonary disease with (acute) exacerbation: Secondary | ICD-10-CM | POA: Diagnosis not present

## 2019-06-12 DIAGNOSIS — I4892 Unspecified atrial flutter: Secondary | ICD-10-CM | POA: Diagnosis not present

## 2019-06-12 LAB — BASIC METABOLIC PANEL
Anion gap: 10 (ref 5–15)
BUN: 12 mg/dL (ref 8–23)
CO2: 31 mmol/L (ref 22–32)
Calcium: 9.1 mg/dL (ref 8.9–10.3)
Chloride: 101 mmol/L (ref 98–111)
Creatinine, Ser: 0.78 mg/dL (ref 0.44–1.00)
GFR calc Af Amer: >60 mL/min (ref >60)
GFR calc non Af Amer: >60 mL/min (ref >60)
Glucose, Bld: 128 mg/dL — ABNORMAL HIGH (ref 70–99)
Potassium: 4.2 mmol/L (ref 3.5–5.1)
Sodium: 142 mmol/L (ref 135–145)

## 2019-06-12 LAB — MAGNESIUM: Magnesium: 2.1 mg/dL (ref 1.7–2.4)

## 2019-06-12 MED ORDER — ALBUTEROL SULFATE (2.5 MG/3ML) 0.083% IN NEBU: 2.5000 mg | INHALATION_SOLUTION | Freq: Four times a day (QID) | RESPIRATORY_TRACT | Status: DC | PRN

## 2019-06-12 MED ORDER — AMLODIPINE BESYLATE 5 MG PO TABS
5.0000 mg | ORAL_TABLET | Freq: Every day | ORAL | Status: DC
  Administered 2019-06-13 – 2019-06-15 (×3): 5 mg via ORAL
  Filled 2019-06-12 (×3): qty 1

## 2019-06-12 MED ORDER — AZELASTINE HCL 0.1 % NA SOLN
2.0000 | Freq: Every evening | NASAL | Status: DC | PRN
  Filled 2019-06-12: qty 30

## 2019-06-12 MED ORDER — OLOPATADINE HCL 0.1 % OP SOLN
1.0000 [drp] | Freq: Two times a day (BID) | OPHTHALMIC | Status: DC
  Administered 2019-06-13 – 2019-06-15 (×5): 1 [drp] via OPHTHALMIC
  Filled 2019-06-12: qty 5

## 2019-06-12 MED ORDER — SODIUM CHLORIDE 0.9% FLUSH
3.0000 mL | Freq: Two times a day (BID) | INTRAVENOUS | Status: DC
  Administered 2019-06-13 – 2019-06-15 (×4): 3 mL via INTRAVENOUS

## 2019-06-12 MED ORDER — LEVOCETIRIZINE DIHYDROCHLORIDE 5 MG PO TABS: 5.0000 mg | ORAL_TABLET | Freq: Every evening | ORAL | Status: DC | PRN

## 2019-06-12 MED ORDER — SODIUM CHLORIDE 0.9% FLUSH: 3.0000 mL | INTRAVENOUS | Status: DC | PRN

## 2019-06-12 MED ORDER — ATORVASTATIN CALCIUM 10 MG PO TABS
10.0000 mg | ORAL_TABLET | Freq: Every day | ORAL | Status: DC
  Administered 2019-06-12 – 2019-06-14 (×3): 10 mg via ORAL
  Filled 2019-06-12 (×3): qty 1

## 2019-06-12 MED ORDER — SODIUM CHLORIDE 0.9 % IV SOLN: 250.0000 mL | INTRAVENOUS | Status: DC | PRN

## 2019-06-12 MED ORDER — DOFETILIDE 500 MCG PO CAPS
500.0000 ug | ORAL_CAPSULE | Freq: Two times a day (BID) | ORAL | Status: DC
  Administered 2019-06-12 – 2019-06-15 (×6): 500 ug via ORAL
  Filled 2019-06-12 (×6): qty 1

## 2019-06-12 MED ORDER — PANTOPRAZOLE SODIUM 40 MG PO TBEC
40.0000 mg | DELAYED_RELEASE_TABLET | Freq: Every day | ORAL | Status: DC
  Administered 2019-06-13 – 2019-06-15 (×3): 40 mg via ORAL
  Filled 2019-06-12 (×3): qty 1

## 2019-06-12 MED ORDER — ALBUTEROL SULFATE (2.5 MG/3ML) 0.083% IN NEBU
2.5000 mg | INHALATION_SOLUTION | RESPIRATORY_TRACT | Status: DC
  Administered 2019-06-12: 2.5 mg via RESPIRATORY_TRACT
  Filled 2019-06-12: qty 3

## 2019-06-12 MED ORDER — FERROUS SULFATE 325 (65 FE) MG PO TABS
325.0000 mg | ORAL_TABLET | Freq: Two times a day (BID) | ORAL | Status: DC
  Administered 2019-06-12 – 2019-06-15 (×6): 325 mg via ORAL
  Filled 2019-06-12 (×6): qty 1

## 2019-06-12 MED ORDER — FLUTICASONE-UMECLIDIN-VILANT 100-62.5-25 MCG/INH IN AEPB: 1.0000 | INHALATION_SPRAY | Freq: Every day | RESPIRATORY_TRACT | Status: DC

## 2019-06-12 MED ORDER — APIXABAN 5 MG PO TABS
5.0000 mg | ORAL_TABLET | Freq: Two times a day (BID) | ORAL | Status: DC
  Administered 2019-06-12 – 2019-06-15 (×6): 5 mg via ORAL
  Filled 2019-06-12 (×6): qty 1

## 2019-06-12 MED ORDER — FLUOXETINE HCL 20 MG PO CAPS
40.0000 mg | ORAL_CAPSULE | Freq: Every day | ORAL | Status: DC
  Administered 2019-06-13 – 2019-06-15 (×3): 40 mg via ORAL
  Filled 2019-06-12 (×3): qty 2

## 2019-06-12 MED ORDER — LEVOTHYROXINE SODIUM 50 MCG PO TABS
50.0000 ug | ORAL_TABLET | Freq: Every day | ORAL | Status: DC
  Administered 2019-06-13 – 2019-06-15 (×3): 50 ug via ORAL
  Filled 2019-06-12 (×3): qty 1

## 2019-06-12 MED ORDER — METOPROLOL TARTRATE 100 MG PO TABS
100.0000 mg | ORAL_TABLET | Freq: Two times a day (BID) | ORAL | Status: DC
  Administered 2019-06-12 – 2019-06-15 (×6): 100 mg via ORAL
  Filled 2019-06-12 (×6): qty 1

## 2019-06-12 MED ORDER — ALBUTEROL SULFATE (2.5 MG/3ML) 0.083% IN NEBU: 3.0000 mL | INHALATION_SOLUTION | RESPIRATORY_TRACT | Status: DC | PRN

## 2019-06-12 MED ORDER — GLUCOSAMINE-CHONDROITIN 500-400 MG PO CAPS: ORAL_CAPSULE | Freq: Every day | ORAL | Status: DC

## 2019-06-12 MED ORDER — FAMOTIDINE 20 MG PO TABS
20.0000 mg | ORAL_TABLET | Freq: Every day | ORAL | Status: DC
  Administered 2019-06-13 – 2019-06-15 (×3): 20 mg via ORAL
  Filled 2019-06-12 (×3): qty 1

## 2019-06-12 MED ORDER — FUROSEMIDE 80 MG PO TABS
80.0000 mg | ORAL_TABLET | Freq: Two times a day (BID) | ORAL | Status: DC
  Administered 2019-06-12 – 2019-06-15 (×6): 80 mg via ORAL
  Filled 2019-06-12 (×6): qty 1

## 2019-06-12 MED ORDER — POTASSIUM CHLORIDE CRYS ER 20 MEQ PO TBCR
20.0000 meq | EXTENDED_RELEASE_TABLET | Freq: Two times a day (BID) | ORAL | Status: DC
  Administered 2019-06-12 – 2019-06-15 (×6): 20 meq via ORAL
  Filled 2019-06-12 (×6): qty 1

## 2019-06-12 NOTE — Care Management (Signed)
Per [place] Help Desk:  Co-pay amount for dofetilide (TIKOSYN} 178mg,250mcg.5061m bid for 30 day supply $14.29 @[facility]  or [facility].  No PA required Deductible not met [facility] : [facility] Co-pay amount for dofetilide(TIKOSYN) 12557m250mcg.[id number]  90 day supply $42.24.

## 2019-06-12 NOTE — H&P (Signed)
Primary Care Physician: McLean-Scocuzza, Nino Glow, MD  Primary Cardiologist: Dr Stanford Breed  Primary Electrophysiologist: Dr Lovena Le  Referring Physician: Dr Hansel Starling is a 69 y.o. female with a history of paroxysmal atrial fibrillation, prior PE, atrial flutter, COPD, morbid obesity, chronic diastolic CHF, HTN who presents for follow up in the [facility]. The patient was initially diagnosed with atrial flutter in 2016 and underwent flutter ablation by Dr Lovena Le. She later developed afib and was started on propafenone. Patient is on Eliquis for a CHADS2VASC score of 4. Patient is s/p DCCV on 04/09/19 but unfortunately was back in afib on follow up with Coletta Memos and Dr Stanford Breed who recommended dofetilide. Of note, she had her second COVID shot two days prior to her afib onset. She does have symptoms of palpitations and fatigue when she is in afib.  On follow up today, she presents for dofetilide admission. She remains in rate controlled afib today with symptoms of fatigue and palpitations. She denies any missed doses of anticoagulation in the last 3 weeks.  Today, she denies symptoms of chest pain, orthopnea, PND, lower extremity edema, dizziness, presyncope, syncope, snoring, daytime somnolence, bleeding, or neurologic sequela. The patient is tolerating medications without difficulties and is otherwise without complaint today.  Atrial Fibrillation Risk Factors:  she does not have symptoms or diagnosis of sleep apnea.  she does not have a history of rheumatic fever.  she has a BMI of Body mass index is 45.36 kg/m.Marland Kitchen     Filed Weights   06/12/19 1101  Weight: 112.5 kg        Family History  Problem Relation Age of Onset  . Heart disease Mother   . Liver disease Mother    alcohol related  . Asthma Brother   . Esophageal cancer Brother   . Asthma Son   . Colon cancer Neg Hx   . Stomach cancer Neg Hx   . Pancreatic cancer Neg Hx   . Inflammatory  bowel disease Neg Hx   . Allergic rhinitis Neg Hx   . Eczema Neg Hx   . Urticaria Neg Hx    Atrial Fibrillation Management history:  Previous antiarrhythmic drugs: propafenone  Previous cardioversions: 2016, 2018, 04/09/19  Previous ablations: 2016 flutter  CHADS2VASC score: 4  Anticoagulation history: Eliquis      Past Medical History:  Diagnosis Date  . Anemia    hx  . Anxiety   . Arthritis   . Asthma   . Atrial fibrillation and flutter ([place])   . Atypical lobular hyperplasia of right breast 05/09/2015  . Breast CA ([place])    ?  Marland Kitchen Chronic rhinitis   . COPD (chronic obstructive pulmonary disease) ([place])    [place]. PFTs 07/25/06 FEV1 55% ratio 53, DLC0 51% HFA 50% 12/16/2009  . Depression   . FRACTURE, RIB, RIGHT 06/18/2009  . Glucose intolerance (impaired glucose tolerance)    on steroids  . History of DVT (deep vein thrombosis)   . Hx of cardiac catheterization    LHC (01/2001): Normal Cors. EF 65%.; LexiScan Myoview (03/2013): No ischemia, EF 61%, normal  . Hx of echocardiogram    Echo (11/2011): Mod LVH, EF 60-65%, no RWMA, MAC, mild LAE, PASP 34.  Marland Kitchen HYPERLIPIDEMIA   . Hypertension   . Hypothyroidism   . Morbid obesity ([place])    target wt=179lb for BMI<30 Peak wt 282lb  . Osteoporosis    last BMD 4/08 -2.4, intolerant of bisphosphonates  .  Peripheral vascular disease ([facility])    hx dvt.pe  . Pneumonia    hx  . PONV (postoperative nausea and vomiting)   . PREMATURE VENTRICULAR CONTRACTIONS   . PULMONARY EMBOLISM, HX OF 06/19/2007  . Recurrent upper respiratory infection (URI)   . Rhabdomyolysis 07/29/2009  . Tremor   . VITAMIN D DEFICIENCY         Past Surgical History:  Procedure Laterality Date  . A-FLUTTER ABLATION N/A 10/08/2016   Procedure: A-FLUTTER ABLATION; Surgeon: Evans Lance, MD; Location: [facility]; Service: Cardiovascular; Laterality: N/A;  . ABDOMINAL HYSTERECTOMY    . BREAST LUMPECTOMY WITH RADIOACTIVE SEED LOCALIZATION Right 06/05/2015    Procedure: RIGHT BREAST LUMPECTOMY WITH RADIOACTIVE SEED LOCALIZATION; Surgeon: Fanny Skates, MD; Location: [facility]; Service: General; Laterality: Right;  . BREAST SURGERY Left    s/p mass removal  . CARDIOVERSION N/A 12/11/2014   Procedure: CARDIOVERSION; Surgeon: Josue Hector, MD; Location: [facility]; Service: Cardiovascular; Laterality: N/A;  . CARDIOVERSION N/A 09/15/2016   Procedure: CARDIOVERSION; Surgeon: Jerline Pain, MD; Location: [facility] ENDOSCOPY; Service: Cardiovascular; Laterality: N/A;  . CARDIOVERSION N/A 04/09/2019   Procedure: CARDIOVERSION; Surgeon: Pixie Casino, MD; Location: [facility]; Service: Cardiovascular; Laterality: N/A;  . COLONOSCOPY  10/22/2003, ?2006  . COLONOSCOPY WITH PROPOFOL N/A 01/15/2016   Procedure: COLONOSCOPY WITH PROPOFOL; Surgeon: Gatha Mayer, MD; Location: [facility]OSCOPY; Service: Endoscopy; Laterality: N/A;  . ESOPHAGOGASTRODUODENOSCOPY (EGD) WITH PROPOFOL N/A 01/15/2016   Procedure: ESOPHAGOGASTRODUODENOSCOPY (EGD) WITH PROPOFOL; Surgeon: Gatha Mayer, MD; Location: [facility]OSCOPY; Service: Endoscopy; Laterality: N/A;  . s/p left arm fracture with fall off chair    . skin graft to middle R finger  1975  . TEE WITHOUT CARDIOVERSION N/A 12/11/2014   Procedure: TRANSESOPHAGEAL ECHOCARDIOGRAM (TEE); Surgeon: Josue Hector, MD; Location: [facility] ENDOSCOPY; Service: Cardiovascular; Laterality: N/A;  . TONSILLECTOMY           Current Outpatient Medications  Medication Sig Dispense Refill  . albuterol (VENTOLIN HFA) 108 (90 Base) MCG/ACT inhaler Inhale 2 puffs into the lungs as needed.     Marland Kitchen amLODipine (NORVASC) 5 MG tablet Take 1 tablet (5 mg total) by mouth daily. 180 tablet 3  . atorvastatin (LIPITOR) 10 MG tablet Take 1 tablet (10 mg total) by mouth daily at 6 PM. 90 tablet 3  . azelastine (ASTELIN) 0.1 % nasal spray Place 2 sprays into both nostrils at bedtime as needed for rhinitis. Use in each nostril as directed 90 mL 3  . benzonatate (TESSALON)  200 MG capsule Take 1 capsule (200 mg total) by mouth 3 (three) times daily as needed for cough. 30 capsule 5  . Calcium Carbonate-Vitamin D 600-400 MG-UNIT per tablet Take 1 tablet by mouth daily.     Marland Kitchen ELIQUIS 5 MG TABS tablet TAKE 1 TABLET BY MOUTH TWICE A DAY 180 tablet 1  . famotidine (PEPCID) 20 MG tablet Take 1 tablet (20 mg total) by mouth daily. At bedtime 90 tablet 3  . ferrous sulfate 325 (65 FE) MG tablet Take 325 mg by mouth 2 (two) times daily with a meal.    . FLUoxetine (PROZAC) 40 MG capsule Take 1 capsule (40 mg total) by mouth daily. 90 capsule 3  . Fluticasone-Umeclidin-Vilant (TRELEGY ELLIPTA) 100-62.5-25 MCG/INH AEPB Inhale 1 puff into the lungs daily. 180 each 3  . furosemide (LASIX) 80 MG tablet TAKE 1 TABLET BY MOUTH IN THE MORNING AND 1 TABLET IN THE AFTERNOON 180 tablet 3  . Glucosamine-Chondroitin (COSAMIN DS  PO) Take 1 tablet by mouth daily.    Marland Kitchen KLOR-CON M20 20 MEQ tablet TAKE 1 TABLET BY MOUTH TWICE DAILY 180 tablet 3  . levalbuterol (XOPENEX) 1.25 MG/3ML nebulizer solution Take 1.25 mg by nebulization every 4 (four) hours. 360 mL 11  . levocetirizine (XYZAL) 5 MG tablet Take 1 tablet (5 mg total) by mouth at bedtime as needed for allergies. 90 tablet 3  . levothyroxine (SYNTHROID) 50 MCG tablet Take 1 tablet (50 mcg total) by mouth daily. mylan brand 90 tablet 3  . metoprolol tartrate (LOPRESSOR) 100 MG tablet Take 1 tablet (100 mg total) by mouth 2 (two) times daily. 60 tablet 3  . olopatadine (PATANOL) 0.1 % ophthalmic solution Place 1 drop into both eyes 2 (two) times daily. 15 mL 4  . OXYGEN Inhale 2.5 L/min into the lungs continuous. And 4 with exertion    . pantoprazole (PROTONIX) 40 MG tablet Take 40 mg by mouth daily.    . sodium chloride (OCEAN) 0.65 % SOLN nasal spray Place 2 sprays every 4 (four) hours as needed into both nostrils for congestion.     No current facility-administered medications for this encounter.        Allergies  Allergen Reactions    . Duloxetine Other (See Comments)    REACTION: rhabdomyolysis  . Penicillins Other (See Comments)    SYNCOPE  Has patient had a PCN reaction causing immediate rash, facial/tongue/throat swelling, SOB or lightheadedness with hypotension: Yes  Has patient had a PCN reaction causing severe rash involving mucus membranes or skin necrosis: No  Has patient had a PCN reaction that required hospitalization No  Has patient had a PCN reaction occurring within the last 10 years: No  If all of the above answers are "NO", then may proceed with Cephalosporin use.   . Latex Rash   Social History        Socioeconomic History  . Marital status: Married    Spouse name: Not on file  . Number of children: 3  . Years of education: Not on file  . Highest education level: Not on file  Occupational History  . 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 Smoking status: Former Smoker    Packs/day: 1.00    Years: 35.00    Pack years: 35.00    Types: Cigarettes    Quit date: 01/11/2006    Years since quitting: 13.4  . Smokeless tobacco: Never Used  Substance and Sexual Activity  . Alcohol use: No    Alcohol/week: 0.0 standard drinks  . Drug use: No  . Sexual activity: Yes  Other Topics Concern  . Not on file  Social History Narrative   Married 1 son 2 daughters   Disabled   2 caffeine/day   Past smoker   11/12/2015   Social Determinants of Health      Financial Resource Strain:   . Difficulty of Paying Living Expenses:   Food Insecurity:   . Worried About Charity fundraiser in the Last Year:   . Arboriculturist in the Last Year:   Transportation Needs:   . Film/video editor (Medical):   Marland Kitchen Lack of Transportation (Non-Medical):   Physical Activity:   . Days of Exercise per Week:   . Minutes of Exercise per Session:   Stress:   . Feeling of Stress :   Social Connections:   . Frequency of Communication with Friends and  Family:   .  Frequency of Social Gatherings with Friends and Family:   . Attends Religious Services:   . Active Member of Clubs or Organizations:   . Attends Archivist Meetings:   Marland Kitchen Marital Status:   Intimate Partner Violence:   . Fear of Current or Ex-Partner:   . Emotionally Abused:   Marland Kitchen Physically Abused:   . Sexually Abused:    ROS- All systems are reviewed and negative except as per the HPI above.  Physical Exam:     Vitals:   06/12/19 1101  BP: 118/70  Pulse: 98  Weight: 112.5 kg  Height: _0  (1.575 m)   GEN- The patient is well appearing obese female, alert and oriented x 3 today.  HEENT-head normocephalic, atraumatic, sclera clear, conjunctiva pink, hearing intact, trachea midline.  Lungs- Clear to ausculation bilaterally, diminished breath sounds, normal work of breathing  Heart- irregular rate and rhythm, no murmurs, rubs or gallops  GI- soft, NT, ND, + BS  Extremities- no clubbing, cyanosis, or edema  MS- no significant deformity or atrophy  Skin- no rash or lesion  Psych- euthymic mood, full affect  Neuro- strength and sensation are intact     Wt Readings from Last 3 Encounters:  06/12/19 112.5 kg  05/21/19 112.7 kg  05/14/19 112.9 kg   EKG today demonstrates afib HR 98, QRS 88, QTc 434, PVC  Echo 07/17/18 demonstrated  1. The left ventricle has normal systolic function with an ejection  fraction of 60-65%. The cavity size was normal. There is moderately  increased left ventricular wall thickness. Left ventricular diastolic  Doppler parameters are consistent with  pseudonormalization. Elevated left ventricular end-diastolic pressure The E/e' is >20.  2. The right ventricle has normal systolic function. The cavity was  normal. There is no increase in right ventricular wall thickness. Right  ventricular systolic pressure is mildly elevated with an estimated  pressure of 32.2 mmHg.  3. Left atrial size was severely dilated.  4. The aortic valve is abnormal. Mild  calcification of the aortic valve.  Aortic valve regurgitation is trivial by color flow Doppler. Mild stenosis  of the aortic valve. Mean gradient 11 mmHg. Difference in mean gradients between exams may be secondary  to increased flow velocities across LVOT on prior study.  5. The mitral valve is abnormal. There is moderate to severe mitral  annular calcification present. Mean diastolic gradient 3 mmHg at HR 64  bpm.  Epic records are reviewed at length today  CHA2DS2-VASc Score = 4  The patient's score is based upon:  CHF History: 0  HTN History: 1  Age : 1  Diabetes History: 0  Stroke History: 0  Vascular Disease History: 1  Gender: 1   ASSESSMENT AND PLAN:  1. Persistent Atrial Fibrillation/atrial flutter  The patient's CHA2DS2-VASc score is 4, indicating a 4.8% annual risk of stroke.  Patient presents today for dofetilide admission.  PharmD has screened medications for QT prolonging drugs. Fluoxetine, Trelegy, and Xopenex can cause QT prolongation but are not contraindicated, will monitor closely.  QTc in SR 437 ms,  Labs today show creatinine at 0.78, K+ 4.2 and mag 2.1, CrCl calculated at 121 mL/min  Continue Eliquis 5 mg BID. Patient denies any missed doses in the last 3 weeks.  Continue Lopressor to 100 mg BID  2. Secondary Hypercoagulable State (ICD10: D68.69)  The patient is at significant risk for stroke/thromboembolism based upon her CHA2DS2-VASc Score of 4. Continue Apixaban (Eliquis).  3.  Obesity  Body mass index is 45.36 kg/m.  Lifestyle modification was discussed and encouraged including regular physical activity and weight reduction.  4. Chronic diastolic CHF  No signs or symptoms of fluid overload.  5. HTN  Stable, no changes today.  To be admitted later today once a bed becomes available.  [facility]  [address]  [place], [place] [address]  [phone]  06/12/2019  11:23 AM  EP Attending  Patient seen and  examined. Agree with the findings as noted above. The patient presents today for initiation of dofetilide. Her QT interval is satisfactory. We will plan for DCCV on Thursday if she does not convert on dofetilide.  Mikle Bosworth.D.

## 2019-06-12 NOTE — Care Management (Signed)
[employer] benefit check submitted, secure messaged [facility] pharmacy staff, and [facility] pharmacy added to profile.

## 2019-06-12 NOTE — Progress Notes (Signed)
Primary Care Physician: McLean-Scocuzza, Nino Glow, MD Primary Cardiologist: Dr Stanford Breed Primary Electrophysiologist: Dr Lovena Le Referring Physician: Dr Hansel Starling is a 69 y.o. female with a history of paroxysmal atrial fibrillation, prior PE, atrial flutter, COPD, morbid obesity, chronic diastolic CHF, HTN who presents for follow up in the [facility]. The patient was initially diagnosed with atrial flutter in 2016 and underwent flutter ablation by Dr Lovena Le. She later developed afib and was started on propafenone. Patient is on Eliquis for a CHADS2VASC score of 4. Patient is s/p DCCV on 04/09/19 but unfortunately was back in afib on follow up with Coletta Memos and Dr Stanford Breed who recommended dofetilide. Of note, she had her second COVID shot two days prior to her afib onset. She does have symptoms of palpitations and fatigue when she is in afib.   On follow up today, she presents for dofetilide admission. She remains in rate controlled afib today with symptoms of fatigue and palpitations. She denies any missed doses of anticoagulation in the last 3 weeks.   Today, she denies symptoms of chest pain, orthopnea, PND, lower extremity edema, dizziness, presyncope, syncope, snoring, daytime somnolence, bleeding, or neurologic sequela. The patient is tolerating medications without difficulties and is otherwise without complaint today.    Atrial Fibrillation Risk Factors:  she does not have symptoms or diagnosis of sleep apnea. she does not have a history of rheumatic fever.   she has a BMI of Body mass index is 45.36 kg/m.Marland Kitchen Filed Weights   06/12/19 1101  Weight: 112.5 kg    Family History  Problem Relation Age of Onset  . Heart disease Mother   . Liver disease Mother        alcohol related  . Asthma Brother   . Esophageal cancer Brother   . Asthma Son   . Colon cancer Neg Hx   . Stomach cancer Neg Hx   . Pancreatic cancer Neg Hx   .  Inflammatory bowel disease Neg Hx   . Allergic rhinitis Neg Hx   . Eczema Neg Hx   . Urticaria Neg Hx      Atrial Fibrillation Management history:  Previous antiarrhythmic drugs: propafenone  Previous cardioversions: 2016, 2018, 04/09/19 Previous ablations: 2016 flutter CHADS2VASC score: 4 Anticoagulation history: Eliquis   Past Medical History:  Diagnosis Date  . Anemia    hx  . Anxiety   . Arthritis   . Asthma   . Atrial fibrillation and flutter ([place])   . Atypical lobular hyperplasia of right breast 05/09/2015  . Breast CA ([place])    ?  Marland Kitchen Chronic rhinitis   . COPD (chronic obstructive pulmonary disease) ([place])    [place]. PFTs 07/25/06 FEV1 55% ratio 53, DLC0 51% HFA 50% 12/16/2009  . Depression   . FRACTURE, RIB, RIGHT 06/18/2009  . Glucose intolerance (impaired glucose tolerance)    on steroids  . History of DVT (deep vein thrombosis)   . Hx of cardiac catheterization    LHC (01/2001):  Normal Cors.  EF 65%.;  LexiScan Myoview (03/2013): No ischemia, EF 61%, normal  . Hx of echocardiogram    Echo (11/2011):  Mod LVH, EF 60-65%, no RWMA, MAC, mild LAE, PASP 34.  Marland Kitchen HYPERLIPIDEMIA   . Hypertension   . Hypothyroidism   . Morbid obesity ([place])    target wt=179lb for BMI<30 Peak wt 282lb  . Osteoporosis    last BMD 4/08 -2.4, intolerant of bisphosphonates  . Peripheral  vascular disease ([place])    hx dvt.pe  . Pneumonia    hx  . PONV (postoperative nausea and vomiting)   . PREMATURE VENTRICULAR CONTRACTIONS   . PULMONARY EMBOLISM, HX OF 06/19/2007  . Recurrent upper respiratory infection (URI)   . Rhabdomyolysis 07/29/2009  . Tremor   . VITAMIN D DEFICIENCY    Past Surgical History:  Procedure Laterality Date  . A-FLUTTER ABLATION N/A 10/08/2016   Procedure: A-FLUTTER ABLATION;  Surgeon: Evans Lance, MD;  Location: [facility];  Service: Cardiovascular;  Laterality: N/A;  . ABDOMINAL HYSTERECTOMY    . BREAST LUMPECTOMY WITH RADIOACTIVE SEED LOCALIZATION Right  06/05/2015   Procedure: RIGHT BREAST LUMPECTOMY WITH RADIOACTIVE SEED LOCALIZATION;  Surgeon: Fanny Skates, MD;  Location: [facility];  Service: General;  Laterality: Right;  . BREAST SURGERY Left    s/p mass removal  . CARDIOVERSION N/A 12/11/2014   Procedure: CARDIOVERSION;  Surgeon: Josue Hector, MD;  Location: [facility];  Service: Cardiovascular;  Laterality: N/A;  . CARDIOVERSION N/A 09/15/2016   Procedure: CARDIOVERSION;  Surgeon: Jerline Pain, MD;  Location: [facility];  Service: Cardiovascular;  Laterality: N/A;  . CARDIOVERSION N/A 04/09/2019   Procedure: CARDIOVERSION;  Surgeon: Pixie Casino, MD;  Location: [facility] ENDOSCOPY;  Service: Cardiovascular;  Laterality: N/A;  . COLONOSCOPY  10/22/2003, ?2006  . COLONOSCOPY WITH PROPOFOL N/A 01/15/2016   Procedure: COLONOSCOPY WITH PROPOFOL;  Surgeon: Gatha Mayer, MD;  Location: [facility]OSCOPY;  Service: Endoscopy;  Laterality: N/A;  . ESOPHAGOGASTRODUODENOSCOPY (EGD) WITH PROPOFOL N/A 01/15/2016   Procedure: ESOPHAGOGASTRODUODENOSCOPY (EGD) WITH PROPOFOL;  Surgeon: Gatha Mayer, MD;  Location: [facility]OSCOPY;  Service: Endoscopy;  Laterality: N/A;  . s/p left arm fracture with fall off chair    . skin graft to middle R finger  1975  . TEE WITHOUT CARDIOVERSION N/A 12/11/2014   Procedure: TRANSESOPHAGEAL ECHOCARDIOGRAM (TEE);  Surgeon: Josue Hector, MD;  Location: [facility] ENDOSCOPY;  Service: Cardiovascular;  Laterality: N/A;  . TONSILLECTOMY      Current Outpatient Medications  Medication Sig Dispense Refill  . albuterol (VENTOLIN HFA) 108 (90 Base) MCG/ACT inhaler Inhale 2 puffs into the lungs as needed.     Marland Kitchen amLODipine (NORVASC) 5 MG tablet Take 1 tablet (5 mg total) by mouth daily. 180 tablet 3  . atorvastatin (LIPITOR) 10 MG tablet Take 1 tablet (10 mg total) by mouth daily at 6 PM. 90 tablet 3  . azelastine (ASTELIN) 0.1 % nasal spray Place 2 sprays into both nostrils at bedtime as needed for rhinitis. Use in each nostril as directed  90 mL 3  . benzonatate (TESSALON) 200 MG capsule Take 1 capsule (200 mg total) by mouth 3 (three) times daily as needed for cough. 30 capsule 5  . Calcium Carbonate-Vitamin D 600-400 MG-UNIT per tablet Take 1 tablet by mouth daily.     Marland Kitchen ELIQUIS 5 MG TABS tablet TAKE 1 TABLET BY MOUTH  TWICE A DAY 180 tablet 1  . famotidine (PEPCID) 20 MG tablet Take 1 tablet (20 mg total) by mouth daily. At bedtime 90 tablet 3  . ferrous sulfate 325 (65 FE) MG tablet Take 325 mg by mouth 2 (two) times daily with a meal.    . FLUoxetine (PROZAC) 40 MG capsule Take 1 capsule (40 mg total) by mouth daily. 90 capsule 3  . Fluticasone-Umeclidin-Vilant (TRELEGY ELLIPTA) 100-62.5-25 MCG/INH AEPB Inhale 1 puff into the lungs daily. 180 each 3  . furosemide (LASIX) 80 MG tablet  TAKE 1 TABLET BY MOUTH IN  THE MORNING AND 1 TABLET IN THE AFTERNOON 180 tablet 3  . Glucosamine-Chondroitin (COSAMIN DS PO) Take 1 tablet by mouth daily.    Marland Kitchen KLOR-CON M20 20 MEQ tablet TAKE 1 TABLET BY MOUTH  TWICE DAILY 180 tablet 3  . levalbuterol (XOPENEX) 1.25 MG/3ML nebulizer solution Take 1.25 mg by nebulization every 4 (four) hours. 360 mL 11  . levocetirizine (XYZAL) 5 MG tablet Take 1 tablet (5 mg total) by mouth at bedtime as needed for allergies. 90 tablet 3  . levothyroxine (SYNTHROID) 50 MCG tablet Take 1 tablet (50 mcg total) by mouth daily. mylan brand 90 tablet 3  . metoprolol tartrate (LOPRESSOR) 100 MG tablet Take 1 tablet (100 mg total) by mouth 2 (two) times daily. 60 tablet 3  . olopatadine (PATANOL) 0.1 % ophthalmic solution Place 1 drop into both eyes 2 (two) times daily. 15 mL 4  . OXYGEN Inhale 2.5 L/min into the lungs continuous. And 4 with exertion    . pantoprazole (PROTONIX) 40 MG tablet Take 40 mg by mouth daily.    . sodium chloride (OCEAN) 0.65 % SOLN nasal spray Place 2 sprays every 4 (four) hours as needed into both nostrils for congestion.     No current facility-administered medications for this encounter.     Allergies  Allergen Reactions  . Duloxetine Other (See Comments)    REACTION: rhabdomyolysis  . Penicillins Other (See Comments)    SYNCOPE Has patient had a PCN reaction causing immediate rash, facial/tongue/throat swelling, SOB or lightheadedness with hypotension: Yes Has patient had a PCN reaction causing severe rash involving mucus membranes or skin necrosis: No Has patient had a PCN reaction that required hospitalization No Has patient had a PCN reaction occurring within the last 10 years: No If all of the above answers are "NO", then may proceed with Cephalosporin use.   . Latex Rash    Social History   Socioeconomic History  . Marital status: Married    Spouse name: Not on file  . Number of children: 3  . Years of education: Not on file  . Highest education level: Not on file  Occupational History  . 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 Smoking status: Former Smoker    Packs/day: 1.00    Years: 35.00    Pack years: 35.00    Types: Cigarettes    Quit date: 01/11/2006    Years since quitting: 13.4  . Smokeless tobacco: Never Used  Substance and Sexual Activity  . Alcohol use: No    Alcohol/week: 0.0 standard drinks  . Drug use: No  . Sexual activity: Yes  Other Topics Concern  . Not on file  Social History Narrative   Married 1 son 2 daughters   Disabled   2 caffeine/day   Past smoker   11/12/2015   Social Determinants of Health   Financial Resource Strain:   . Difficulty of Paying Living Expenses:   Food Insecurity:   . Worried About Charity fundraiser in the Last Year:   . Arboriculturist in the Last Year:   Transportation Needs:   . Film/video editor (Medical):   Marland Kitchen Lack of Transportation (Non-Medical):   Physical Activity:   . Days of Exercise per Week:   . Minutes of Exercise per Session:   Stress:   . Feeling of Stress :   Social Connections:   .  Frequency of Communication with  Friends and Family:   . Frequency of Social Gatherings with Friends and Family:   . Attends Religious Services:   . Active Member of Clubs or Organizations:   . Attends Archivist Meetings:   Marland Kitchen Marital Status:   Intimate Partner Violence:   . Fear of Current or Ex-Partner:   . Emotionally Abused:   Marland Kitchen Physically Abused:   . Sexually Abused:      ROS- All systems are reviewed and negative except as per the HPI above.  Physical Exam: Vitals:   06/12/19 1101  BP: 118/70  Pulse: 98  Weight: 112.5 kg  Height: _0  (1.575 m)    GEN- The patient is well appearing obese female, alert and oriented x 3 today.   HEENT-head normocephalic, atraumatic, sclera clear, conjunctiva pink, hearing intact, trachea midline. Lungs- Clear to ausculation bilaterally, diminished breath sounds, normal work of breathing Heart- irregular rate and rhythm, no murmurs, rubs or gallops  GI- soft, NT, ND, + BS Extremities- no clubbing, cyanosis, or edema MS- no significant deformity or atrophy Skin- no rash or lesion Psych- euthymic mood, full affect Neuro- strength and sensation are intact   Wt Readings from Last 3 Encounters:  06/12/19 112.5 kg  05/21/19 112.7 kg  05/14/19 112.9 kg    EKG today demonstrates afib HR 98, QRS 88, QTc 434, PVC  Echo 07/17/18 demonstrated  1. The left ventricle has normal systolic function with an ejection  fraction of 60-65%. The cavity size was normal. There is moderately  increased left ventricular wall thickness. Left ventricular diastolic  Doppler parameters are consistent with  pseudonormalization. Elevated left ventricular end-diastolic pressure The E/e' is >20.  2. The right ventricle has normal systolic function. The cavity was  normal. There is no increase in right ventricular wall thickness. Right  ventricular systolic pressure is mildly elevated with an estimated  pressure of 32.2 mmHg.  3. Left atrial size was severely dilated.  4. The  aortic valve is abnormal. Mild calcification of the aortic valve.  Aortic valve regurgitation is trivial by color flow Doppler. Mild stenosis  of the aortic valve. Mean gradient 11 mmHg. Difference in mean gradients between exams may be secondary  to increased flow velocities across LVOT on prior study.  5. The mitral valve is abnormal. There is moderate to severe mitral  annular calcification present. Mean diastolic gradient 3 mmHg at HR 64  bpm.   Epic records are reviewed at length today  CHA2DS2-VASc Score = 4  The patient's score is based upon: CHF History: 0 HTN History: 1 Age : 1 Diabetes History: 0 Stroke History: 0 Vascular Disease History: 1 Gender: 1      ASSESSMENT AND PLAN: 1. Persistent Atrial Fibrillation/atrial flutter The patient's CHA2DS2-VASc score is 4, indicating a 4.8% annual risk of stroke.   Patient presents today for dofetilide admission.  PharmD has screened medications for QT prolonging drugs. Fluoxetine, Trelegy, and Xopenex can cause QT prolongation but are not contraindicated, will monitor closely.  QTc in SR 437 ms, Labs today show creatinine at 0.78, K+ 4.2 and mag 2.1, CrCl calculated at 121 mL/min Continue Eliquis 5 mg BID. Patient denies any missed doses in the last 3 weeks. Continue Lopressor to 100 mg BID  2. Secondary Hypercoagulable State (ICD10:  D68.69) The patient is at significant risk for stroke/thromboembolism based upon her CHA2DS2-VASc Score of 4.  Continue Apixaban (Eliquis).   3. Obesity Body mass index is 45.36 kg/m.  Lifestyle modification was discussed and encouraged including regular physical activity and weight reduction.  4. Chronic diastolic CHF No signs or symptoms of fluid overload.  5. HTN Stable, no changes today.    To be admitted later today once a bed becomes available.    [facility] [address] [place], [place] [address] [phone] 06/12/2019 11:23 AM

## 2019-06-12 NOTE — Progress Notes (Signed)
Pharmacy: Dofetilide (Tikosyn) - Initial Consult Assessment and Electrolyte Replacement  Pharmacy consulted to assist in monitoring and replacing electrolytes in this 69 y.o. female admitted on 06/12/2019 undergoing dofetilide initiation. First dofetilide dose: 6/1 PM  Assessment:  Patient Exclusion Criteria: If any screening criteria checked as "Yes", then  patient  should NOT receive dofetilide until criteria item is corrected.  If "Yes" please indicate correction plan.  YES  NO Patient  Exclusion Criteria Correction Plan   []   [x]   Baseline QTc interval is greater than or equal to 440 msec. IF above YES box checked dofetilide contraindicated unless patient has ICD; then may proceed if QTc 500-550 msec or with known ventricular conduction abnormalities may proceed with QTc 550-600 msec. QTc =      []   [x]   Patient is known or suspected to have a digoxin level greater than 2 ng/ml: No results found for: DIGOXIN     []   [x]   Creatinine clearance less than 20 ml/min (calculated using Cockcroft-Gault, actual body weight and serum creatinine): Estimated Creatinine Clearance: 78.5 mL/min (by C-G formula based on SCr of 0.78 mg/dL).     []   [x]  Patient has received drugs known to prolong the QT intervals within the last 48 hours (phenothiazines, tricyclics or tetracyclic antidepressants, erythromycin, H-1 antihistamines, cisapride, fluoroquinolones, azithromycin). Updated information on QT prolonging agents is available to be searched on the following database:QT prolonging agents     []   [x]   Patient received a dose of hydrochlorothiazide (Oretic) alone or in any combination including triamterene (Dyazide, Maxzide) in the last 48 hours.    []   [x]  Patient received a medication known to increase dofetilide plasma concentrations prior to initial dofetilide dose:  . Trimethoprim (Primsol, Proloprim) in the last 36 hours . Verapamil (Calan, Verelan) in the last 36 hours or a sustained  release dose in the last 72 hours . Megestrol (Megace) in the last 5 days  . Cimetidine (Tagamet) in the last 6 hours . Ketoconazole (Nizoral) in the last 24 hours . Itraconazole (Sporanox) in the last 48 hours  . Prochlorperazine (Compazine) in the last 36 hours     []   [x]   Patient is known to have a history of torsades de pointes; congenital or acquired long QT syndromes.    []   [x]   Patient has received a Class 1 antiarrhythmic with less than 2 half-lives since last dose. (Disopyramide, Quinidine, Procainamide, Lidocaine, Mexiletine, Flecainide, Propafenone)    []   [x]   Patient has received amiodarone therapy in the past 3 months or amiodarone level is greater than 0.3 ng/ml.    Patient has been appropriately anticoagulated with apixaban.  Labs:    Component Value Date/Time   K 4.2 06/12/2019 1122   MG 2.1 06/12/2019 1122     Plan: Potassium: K >/= 4: Appropriate to initiate Tikosyn, no replacement needed    Magnesium: Mg >2: Appropriate to initiate Tikosyn, no replacement needed     Thank you for allowing pharmacy to participate in this patient's care   Antonietta Jewel, PharmD, [place] Pharmacist  Phone: [phone] 06/12/2019 1:04 PM  Please check AMION for all [place] phone numbers After 10:00 PM, call [place] [phone]

## 2019-06-13 LAB — BASIC METABOLIC PANEL
Anion gap: 8 (ref 5–15)
BUN: 11 mg/dL (ref 8–23)
CO2: 33 mmol/L — ABNORMAL HIGH (ref 22–32)
Calcium: 8.7 mg/dL — ABNORMAL LOW (ref 8.9–10.3)
Chloride: 102 mmol/L (ref 98–111)
Creatinine, Ser: 0.73 mg/dL (ref 0.44–1.00)
GFR calc Af Amer: >60 mL/min (ref >60)
GFR calc non Af Amer: >60 mL/min (ref >60)
Glucose, Bld: 149 mg/dL — ABNORMAL HIGH (ref 70–99)
Potassium: 3.8 mmol/L (ref 3.5–5.1)
Sodium: 143 mmol/L (ref 135–145)

## 2019-06-13 LAB — PROTIME-INR
INR: 1 (ref 0.8–1.2)
Prothrombin Time: 13.2 seconds (ref 11.4–15.2)

## 2019-06-13 LAB — MAGNESIUM: Magnesium: 2.2 mg/dL (ref 1.7–2.4)

## 2019-06-13 LAB — HIV ANTIBODY (ROUTINE TESTING W REFLEX): HIV Screen 4th Generation wRfx: NONREACTIVE

## 2019-06-13 MED ORDER — POTASSIUM CHLORIDE CRYS ER 20 MEQ PO TBCR
40.0000 meq | EXTENDED_RELEASE_TABLET | Freq: Once | ORAL | Status: AC
  Administered 2019-06-13: 40 meq via ORAL
  Filled 2019-06-13: qty 2

## 2019-06-13 MED ORDER — HYDROCORTISONE 1 % EX CREA
1.0000 "application " | TOPICAL_CREAM | Freq: Three times a day (TID) | CUTANEOUS | Status: DC | PRN
  Filled 2019-06-13: qty 28

## 2019-06-13 NOTE — Progress Notes (Addendum)
Post dise EKG reviewed AFib, manually measured QT by myself 373ms, QTc 459ms Telemetry is reviewed.   She is having increased burden of WCT beats, some 6-7beats long.  They do not change axis, and question if perhaps intermittent BBB rather then NSVT.  No symptoms were reported to the RN  I will review with Dr. Hettie Holstein, PA-C  ADDEND: Reviewed some tele strips with Dr. Lovena Le.  No changes.  Tommye Standard, PA-C

## 2019-06-13 NOTE — Progress Notes (Addendum)
EKG performed. QTc 512. Charlcie Cradle, PA paged.   CCMD called RN and stated pt had 5 beat run of Vtach Vital signs taken. Will continue to monitor closely.

## 2019-06-13 NOTE — Progress Notes (Addendum)
Progress Note  Patient Name: [name] [name] Date of Encounter: 06/13/2019  Primary Cardiologist: Kirk Ruths, MD   Subjective   Feels well, tolerating drug  Inpatient Medications    Scheduled Meds: . amLODipine  5 mg Oral Daily  . apixaban  5 mg Oral BID  . atorvastatin  10 mg Oral q1800  . dofetilide  500 mcg Oral BID  . famotidine  20 mg Oral Daily  . ferrous sulfate  325 mg Oral BID WC  . FLUoxetine  40 mg Oral Daily  . furosemide  80 mg Oral BID  . levothyroxine  50 mcg Oral Q0600  . metoprolol tartrate  100 mg Oral BID  . olopatadine  1 drop Both Eyes BID  . pantoprazole  40 mg Oral Daily  . potassium chloride SA  20 mEq Oral BID  . sodium chloride flush  3 mL Intravenous Q12H   Continuous Infusions: . sodium chloride     PRN Meds: sodium chloride, albuterol, azelastine, sodium chloride flush   Vital Signs    Vitals:   06/12/19 2015 06/13/19 0003 06/13/19 0603 06/13/19 0739  BP: 118/66 121/68 117/66 (!) 127/97  Pulse: 77 80 82 (!) 110  Resp: 18 18 18 16   Temp: 98 F (36.7 C) 97.9 F (36.6 C) 98 F (36.7 C) 98.6 F (37 C)  TempSrc: Oral Oral Oral Oral  SpO2: 98% 99% 99% 98%  Weight:   111.7 kg   Height:        Intake/Output Summary (Last 24 hours) at 06/13/2019 0902 Last data filed at 06/13/2019 0830 Gross per 24 hour  Intake 462 ml  Output --  Net 462 ml   Last 3 Weights 06/13/2019 06/12/2019 06/12/2019  Weight (lbs) 246 lb 4.1 oz 246 lb 14.4 oz 248 lb  Weight (kg) 111.7 kg 111.993 kg 112.492 kg      Telemetry    AFib 110's - Personally Reviewed  ECG    AFib 102bpm, measured QT 310ms, QTc 49ms - Personally Reviewed  Physical Exam   GEN: No acute distress.   Neck: No JVD Cardiac: irreg-irreg, tachycardic, no murmurs, rubs, or gallops.  Respiratory: CTA b/l. GI: Soft, nontender, non-distended  MS: No edema; No deformity. Neuro:  Nonfocal  Psych: Normal affect   Labs    High Sensitivity Troponin:  No results for input(s): TROPONINIHS  in the last 720 hours.    Chemistry Recent Labs  Lab 06/12/19 1122 06/13/19 0555  NA 142 143  K 4.2 3.8  CL 101 102  CO2 31 33*  GLUCOSE 128* 149*  BUN 12 11  CREATININE 0.78 0.73  CALCIUM 9.1 8.7*  GFRNONAA >60 >60  GFRAA >60 >60  ANIONGAP 10 8     HematologyNo results for input(s): WBC, RBC, HGB, HCT, MCV, MCH, MCHC, RDW, PLT in the last 168 hours.  BNPNo results for input(s): BNP, PROBNP in the last 168 hours.   DDimer No results for input(s): DDIMER in the last 168 hours.   Radiology    No results found.  Cardiac Studies   07/17/2018: TTE IMPRESSIONS  1. The left ventricle has normal systolic function with an ejection  fraction of 60-65%. The cavity size was normal. There is moderately  increased left ventricular wall thickness. Left ventricular diastolic  Doppler parameters are consistent with  pseudonormalization. Elevated left ventricular end-diastolic pressure The  E/e' is >20.  2. The right ventricle has normal systolic function. The cavity was  normal. There is no increase  in right ventricular wall thickness. Right  ventricular systolic pressure is mildly elevated with an estimated  pressure of 32.2 mmHg.  3. Left atrial size was severely dilated.  86mm 4. The aortic valve is abnormal. Mild calcification of the aortic valve.  Aortic valve regurgitation is trivial by color flow Doppler. Mild stenosis  of the aortic valve. Mean gradient 11 mmHg. Difference in mean gradients  between exams may be secondary  to increased flow velocities across LVOT on prior study.  5. The mitral valve is abnormal. There is moderate to severe mitral  annular calcification present. Mean diastolic gradient 3 mmHg at HR 64  bpm.   Patient Profile     69 y.o. female  paroxysmal atrial fibrillation, prior PE, atrial flutter (ablated 2016), COPD, morbid obesity, chronic diastolic CHF, HTN, HLD, hypothyroidism, obese, chronic CHF (diastolic) admitted for Tikosyn  initiation  Previous antiarrhythmic drugs: propafenone  Previous cardioversions: 2016, 2018, 04/09/19 Previous ablations: 2016 flutter CHADS2VASC score: 4 Anticoagulation history: Eliquis  Assessment & Plan    1. Persistent Afib     CHA2DS2Vasc is 4, on Eliquis     Tikosyn initiation in progress     K+ 3.8 (replaced)     Mag 2.2     Creat stable     QTc is OK to continue  DCCV tomorrow if not in SR Pt is aware and agreeable   2. HTN     Continue home meds  3. Chronic CHF (diastolic)     Appears compensated currently      On home regime  4. Hypothyroidism     On home med  For questions or updates, please contact [place] Please consult [email or web address] for contact info under   Signed, Baldwin [place], PA-C  06/13/2019, 9:02 AM    EP Attending  Patient seen and examined. Agree with the findings as noted above. The patient remains in atrial fib. She will continue her current meds and we will plan for DCCV tomorrow if she remains in atrial fib on dofetilide.  Mikle Bosworth.D.

## 2019-06-13 NOTE — Progress Notes (Signed)
Pharmacy: Dofetilide (Tikosyn) - Follow Up Assessment and Electrolyte Replacement  Pharmacy consulted to assist in monitoring and replacing electrolytes in this 69 y.o. female admitted on 06/12/2019 undergoing dofetilide initiation. First dofetilide dose: 6/1@2020 .  Labs:    Component Value Date/Time   K 3.8 06/13/2019 0555   MG 2.2 06/13/2019 0555     Plan: Potassium: K 3.8-3.9:  Give KCl 40 mEq po x1   Magnesium: Mg > 2: No additional supplementation needed   Thank you for allowing pharmacy to participate in this patient's care   Antonietta Jewel, PharmD, [place] Pharmacist  Phone: [phone] 06/13/2019 7:39 AM  Please check AMION for all [place] phone numbers After 10:00 PM, call [place] [phone]

## 2019-06-13 NOTE — H&P (View-Only) (Signed)
Post dise EKG reviewed AFib, manually measured QT by myself 376ms, QTc 410ms Telemetry is reviewed.   She is having increased burden of WCT beats, some 6-7beats long.  They do not change axis, and question if perhaps intermittent BBB rather then NSVT.  No symptoms were reported to the RN  I will review with Dr. Hettie Holstein, PA-C  ADDEND: Reviewed some tele strips with Dr. Lovena Le.  No changes.  Tommye Standard, PA-C

## 2019-06-14 ENCOUNTER — Encounter (HOSPITAL_COMMUNITY): Payer: Self-pay | Admitting: Internal Medicine

## 2019-06-14 ENCOUNTER — Encounter (HOSPITAL_COMMUNITY)
Admission: RE | Disposition: A | Discharge status: Home / Self Care | Payer: Self-pay | Source: Ambulatory Visit | Attending: Internal Medicine

## 2019-06-14 ENCOUNTER — Inpatient Hospital Stay (HOSPITAL_COMMUNITY): Payer: Medicare Other | Admitting: Certified Registered Nurse Anesthetist

## 2019-06-14 DIAGNOSIS — I10 Essential (primary) hypertension: Secondary | ICD-10-CM

## 2019-06-14 DIAGNOSIS — I5032 Chronic diastolic (congestive) heart failure: Secondary | ICD-10-CM

## 2019-06-14 HISTORY — PX: CARDIOVERSION: SHX1299

## 2019-06-14 LAB — BASIC METABOLIC PANEL
Anion gap: 7 (ref 5–15)
BUN: 13 mg/dL (ref 8–23)
CO2: 32 mmol/L (ref 22–32)
Calcium: 8.7 mg/dL — ABNORMAL LOW (ref 8.9–10.3)
Chloride: 103 mmol/L (ref 98–111)
Creatinine, Ser: 0.8 mg/dL (ref 0.44–1.00)
GFR calc Af Amer: >60 mL/min (ref >60)
GFR calc non Af Amer: >60 mL/min (ref >60)
Glucose, Bld: 157 mg/dL — ABNORMAL HIGH (ref 70–99)
Potassium: 4 mmol/L (ref 3.5–5.1)
Sodium: 142 mmol/L (ref 135–145)

## 2019-06-14 LAB — GLUCOSE, CAPILLARY: Glucose-Capillary: 111 mg/dL — ABNORMAL HIGH (ref 70–99)

## 2019-06-14 LAB — MAGNESIUM: Magnesium: 2 mg/dL (ref 1.7–2.4)

## 2019-06-14 SURGERY — CARDIOVERSION
Anesthesia: General

## 2019-06-14 MED ORDER — LIDOCAINE 2% (20 MG/ML) 5 ML SYRINGE
INTRAMUSCULAR | Status: DC | PRN
  Administered 2019-06-14: 50 mg via INTRAVENOUS

## 2019-06-14 MED ORDER — PROPOFOL 10 MG/ML IV BOLUS
INTRAVENOUS | Status: DC | PRN
  Administered 2019-06-14: 50 mg via INTRAVENOUS

## 2019-06-14 MED ORDER — SODIUM CHLORIDE 0.9 % IV SOLN: INTRAVENOUS | Status: DC | PRN

## 2019-06-14 MED ORDER — EPHEDRINE SULFATE-NACL 50-0.9 MG/10ML-% IV SOSY
PREFILLED_SYRINGE | INTRAVENOUS | Status: DC | PRN
  Administered 2019-06-14: 10 mg via INTRAVENOUS

## 2019-06-14 MED ORDER — MAGNESIUM SULFATE 2 GM/50ML IV SOLN
2.0000 g | Freq: Once | INTRAVENOUS | Status: AC
  Administered 2019-06-14: 2 g via INTRAVENOUS
  Filled 2019-06-14: qty 50

## 2019-06-14 NOTE — Discharge Summary (Addendum)
ELECTROPHYSIOLOGY PROCEDURE DISCHARGE SUMMARY    Patient ID: [name] [name],  MRN: [id number], DOB/AGE: [birth year] 69 y.o.  Admit date: 06/12/2019 Discharge date: 06/13/2019  Primary Care Physician: McLean-Scocuzza, Nino Glow, MD  Primary Cardiologist: Dr. Stanford Breed Electrophysiologist: Dr. Lovena Le  Primary Discharge Diagnosis:  1.  Persistent atrial fibrillation status post Tikosyn loading this admission      CHA2DS2Vasc is 4, on Eliquis, appropriately dosed   Secondary Discharge Diagnosis:  1. COPD      O2 dependent 2. Obesity 3. Chronic CHF (diastolic) 4. HTN 5. hypothyroidism  Allergies  Allergen Reactions   Duloxetine Other (See Comments)    REACTION: rhabdomyolysis   Penicillins Other (See Comments)    SYNCOPE Has patient had a PCN reaction causing immediate rash, facial/tongue/throat swelling, SOB or lightheadedness with hypotension: Yes Has patient had a PCN reaction causing severe rash involving mucus membranes or skin necrosis: No Has patient had a PCN reaction that required hospitalization No Has patient had a PCN reaction occurring within the last 10 years: No If all of the above answers are "NO", then may proceed with Cephalosporin use.    Latex Rash     Procedures This Admission:  1.  Tikosyn loading 2.  Direct current cardioversion on 06/14/2019 by Dr Stanford Breed which successfully restored SR.  There were no early apparent complications.   Brief HPI: [name] [name] is a 69 y.o. female with a past medical history as noted above.  The patient is followed in the outpatient setting by Dr. Lovena Le.  Risks, benefits, and alternatives to Tikosyn were reviewed with the patient who wished to proceed.    Hospital Course:  The patient was admitted and Tikosyn was initiated.  Renal function and electrolytes were followed during the hospitalization.  Her QTc remained stable.  On 06/14/19 the patient underwent direct current cardioversion which restored sinus rhythm.  She  was monitored until discharge on telemetry which demonstrated SB high 40's-50's.  On the day of discharge, the patient feels well, was examined by Dr Lovena Le who considered the patient stable for discharge to home.  Follow-up has been arranged with the AFib clinic in 1 week and with Dr Lovena Le in 4 weeks.   The patient mentioned at time of discharge loose stools with dark towards black color.  No  In d/w RPH, there is some potential for diarrhea that may be transient for her.   In d/w Dr. Lovena Le, if no significant change in her H/H, can discharge with repeat CBC at her visit next week.   I have advised the patient to monitor this closely, if not resolved or if any worsening, signs/symptoms of anemia to reach out, seek attention. She had DCCV yesterday and is advised to continue her Eliquis without interruption.   Will reduce her home lopressor in half given awake HR dipping to high 40's (Asymptomatic)  Tikosyn teaching was completed  Physical Exam: Vitals:   06/14/19 1736 06/14/19 2005 06/15/19 0546 06/15/19 0548  BP: (!) 108/50 110/68 (!) 117/56   Pulse: 65 66 60   Resp: 20 20 16    Temp: 98 F (36.7 C) 97.8 F (36.6 C) 98.1 F (36.7 C)   TempSrc: Oral Oral Oral   SpO2: 97% 99% 100%   Weight:    112.5 kg  Height:        GEN- The patient is well appearing, alert and oriented x 3 today.   HEENT: normocephalic, atraumatic; sclera clear, conjunctiva pink; hearing intact; oropharynx clear; neck  supple, no JVP Lymph- no cervical lymphadenopathy Lungs-  CTA b/l, normal work of breathing.  No wheezes, rales, rhonchi Heart- RRR, no murmurs, rubs or gallops, PMI not laterally displaced GI- soft, non-tender, non-distended Extremities- no clubbing, cyanosis, or edema MS- no significant deformity or atrophy Skin- warm and dry, no rash or lesion Psych- euthymic mood, full affect Neuro- strength and sensation are intact   Labs:   Lab Results  Component Value Date   WBC 5.7 06/15/2019    HGB 10.3 (L) 06/15/2019   HCT 33.6 (L) 06/15/2019   MCV 88.9 06/15/2019   PLT 235 06/15/2019    Recent Labs  Lab 06/15/19 0510  NA 141  K 4.3  CL 101  CO2 34*  BUN 14  CREATININE 0.86  CALCIUM 8.5*  GLUCOSE 142*     Discharge Medications:  Allergies as of 06/15/2019       Reactions   Duloxetine Other (See Comments)   REACTION: rhabdomyolysis   Penicillins Other (See Comments)   SYNCOPE Has patient had a PCN reaction causing immediate rash, facial/tongue/throat swelling, SOB or lightheadedness with hypotension: Yes Has patient had a PCN reaction causing severe rash involving mucus membranes or skin necrosis: No Has patient had a PCN reaction that required hospitalization No Has patient had a PCN reaction occurring within the last 10 years: No If all of the above answers are "NO", then may proceed with Cephalosporin use.   Latex Rash        Medication List     TAKE these medications    albuterol 108 (90 Base) MCG/ACT inhaler Commonly known as: VENTOLIN HFA Inhale 2 puffs into the lungs as needed for wheezing or shortness of breath.   amLODipine 5 MG tablet Commonly known as: NORVASC Take 1 tablet (5 mg total) by mouth daily.   atorvastatin 10 MG tablet Commonly known as: LIPITOR Take 1 tablet (10 mg total) by mouth daily at 6 PM.   azelastine 0.1 % nasal spray Commonly known as: ASTELIN Place 2 sprays into both nostrils at bedtime as needed for rhinitis. Use in each nostril as directed   benzonatate 200 MG capsule Commonly known as: TESSALON Take 1 capsule (200 mg total) by mouth 3 (three) times daily as needed for cough.   Calcium Carbonate-Vitamin D 600-400 MG-UNIT tablet Take 1 tablet by mouth daily.   COSAMIN DS PO Take 1 tablet by mouth daily.   dofetilide 500 MCG capsule Commonly known as: TIKOSYN Take 1 capsule (500 mcg total) by mouth 2 (two) times daily.   Eliquis 5 MG Tabs tablet Generic drug: apixaban TAKE 1 TABLET BY MOUTH  TWICE A  DAY What changed: how much to take   famotidine 20 MG tablet Commonly known as: PEPCID Take 1 tablet (20 mg total) by mouth daily. At bedtime   ferrous sulfate 325 (65 FE) MG tablet Take 325 mg by mouth 2 (two) times daily with a meal.   FLUoxetine 40 MG capsule Commonly known as: PROZAC Take 1 capsule (40 mg total) by mouth daily.   furosemide 80 MG tablet Commonly known as: LASIX TAKE 1 TABLET BY MOUTH IN  THE MORNING AND 1 TABLET IN THE AFTERNOON What changed: See the new instructions.   Klor-Con M20 20 MEQ tablet Generic drug: potassium chloride SA TAKE 1 TABLET BY MOUTH  TWICE DAILY What changed: how much to take   levalbuterol 1.25 MG/3ML nebulizer solution Commonly known as: XOPENEX Take 1.25 mg by nebulization every 4 (four) hours.  What changed:  when to take this reasons to take this   levocetirizine 5 MG tablet Commonly known as: XYZAL Take 1 tablet (5 mg total) by mouth at bedtime as needed for allergies.   levothyroxine 50 MCG tablet Commonly known as: SYNTHROID Take 1 tablet (50 mcg total) by mouth daily. mylan brand   metoprolol tartrate 50 MG tablet Commonly known as: LOPRESSOR Take 2 tablets (100 mg total) by mouth 2 (two) times daily. What changed: medication strength   olopatadine 0.1 % ophthalmic solution Commonly known as: Patanol Place 1 drop into both eyes 2 (two) times daily.   OXYGEN Inhale 2.5 L/min into the lungs continuous. And 4 with exertion   pantoprazole 40 MG tablet Commonly known as: PROTONIX Take 40 mg by mouth daily.   Trelegy Ellipta 100-62.5-25 MCG/INH Aepb Generic drug: Fluticasone-Umeclidin-Vilant Inhale 1 puff into the lungs daily.        Disposition: Home Discharge Instructions     Diet - low sodium heart healthy   Complete by: As directed    Increase activity slowly   Complete by: As directed       Follow-up Information     [facility].   Specialty:  Cardiology Why: 06/21/2019 @ 10:30AM with Maximino Greenland, NP Contact information: [id number] [place]        Evans Lance, MD Follow up.   Specialty: Cardiology Why: 07/13/2019 @ 11:15AM Contact information: [id number] [address] [phone]            Duration of Discharge Encounter: Greater than 30 minutes including physician time.  Venetia Night, PA-C 06/15/2019 1:09 PM  EP Attending  Patient seen and examined. Agree with the findings as noted above. The patient is maintaining NSR. She will continue her current meds and return for a 12 lead ECG and bmp in a week.   Mikle Bosworth.D.

## 2019-06-14 NOTE — Progress Notes (Addendum)
Progress Note  Patient Name: [name] [name] Date of Encounter: 06/14/2019  Primary Cardiologist: Kirk Ruths, MD   Subjective   Feels well, tolerating drug, ambulating in the room  Inpatient Medications    Scheduled Meds: . amLODipine  5 mg Oral Daily  . apixaban  5 mg Oral BID  . atorvastatin  10 mg Oral q1800  . dofetilide  500 mcg Oral BID  . famotidine  20 mg Oral Daily  . ferrous sulfate  325 mg Oral BID WC  . FLUoxetine  40 mg Oral Daily  . furosemide  80 mg Oral BID  . levothyroxine  50 mcg Oral Q0600  . metoprolol tartrate  100 mg Oral BID  . olopatadine  1 drop Both Eyes BID  . pantoprazole  40 mg Oral Daily  . potassium chloride SA  20 mEq Oral BID  . sodium chloride flush  3 mL Intravenous Q12H   Continuous Infusions: . sodium chloride    . magnesium sulfate bolus IVPB 2 g (06/14/19 0814)   PRN Meds: sodium chloride, albuterol, azelastine, hydrocortisone cream, sodium chloride flush   Vital Signs    Vitals:   06/13/19 2006 06/14/19 0030 06/14/19 0340 06/14/19 0800  BP: 121/74 104/66 118/68 123/66  Pulse: (!) 102 100 95 (!) 117  Resp: 18 18 20 15   Temp: 98 F (36.7 C) 97.8 F (36.6 C) 97.8 F (36.6 C) 97.9 F (36.6 C)  TempSrc: Oral Oral Oral Oral  SpO2: 99% 99%  98%  Weight:   111.5 kg   Height:        Intake/Output Summary (Last 24 hours) at 06/14/2019 0834 Last data filed at 06/13/2019 2030 Gross per 24 hour  Intake 684 ml  Output --  Net 684 ml   Last 3 Weights 06/14/2019 06/13/2019 06/12/2019  Weight (lbs) 245 lb 13 oz 246 lb 4.1 oz 246 lb 14.4 oz  Weight (kg) 111.5 kg 111.7 kg 111.993 kg      Telemetry    AFib 110's - Personally Reviewed  ECG    AFib 102bpm, measured QT 364ms, QTc 46ms - Personally Reviewed  Physical Exam   unchanged GEN: No acute distress.   Neck: No JVD Cardiac: irreg-irreg, tachycardic, no murmurs, rubs, or gallops.  Respiratory: CTA b/l. GI: Soft, nontender, non-distended  MS: No edema; No  deformity. Neuro:  Nonfocal  Psych: Normal affect   Labs    High Sensitivity Troponin:  No results for input(s): TROPONINIHS in the last 720 hours.    Chemistry Recent Labs  Lab 06/12/19 1122 06/13/19 0555 06/14/19 0215  NA 142 143 142  K 4.2 3.8 4.0  CL 101 102 103  CO2 31 33* 32  GLUCOSE 128* 149* 157*  BUN 12 11 13   CREATININE 0.78 0.73 0.80  CALCIUM 9.1 8.7* 8.7*  GFRNONAA >60 >60 >60  GFRAA >60 >60 >60  ANIONGAP 10 8 7      HematologyNo results for input(s): WBC, RBC, HGB, HCT, MCV, MCH, MCHC, RDW, PLT in the last 168 hours.  BNPNo results for input(s): BNP, PROBNP in the last 168 hours.   DDimer No results for input(s): DDIMER in the last 168 hours.   Radiology    No results found.  Cardiac Studies   07/17/2018: TTE IMPRESSIONS  1. The left ventricle has normal systolic function with an ejection  fraction of 60-65%. The cavity size was normal. There is moderately  increased left ventricular wall thickness. Left ventricular diastolic  Doppler parameters are  consistent with  pseudonormalization. Elevated left ventricular end-diastolic pressure The  E/e' is >20.  2. The right ventricle has normal systolic function. The cavity was  normal. There is no increase in right ventricular wall thickness. Right  ventricular systolic pressure is mildly elevated with an estimated  pressure of 32.2 mmHg.  3. Left atrial size was severely dilated.  60mm 4. The aortic valve is abnormal. Mild calcification of the aortic valve.  Aortic valve regurgitation is trivial by color flow Doppler. Mild stenosis  of the aortic valve. Mean gradient 11 mmHg. Difference in mean gradients  between exams may be secondary  to increased flow velocities across LVOT on prior study.  5. The mitral valve is abnormal. There is moderate to severe mitral  annular calcification present. Mean diastolic gradient 3 mmHg at HR 64  bpm.   Patient Profile     69 y.o. female  paroxysmal atrial  fibrillation, prior PE, atrial flutter (ablated 2016), COPD (on home O2), morbid obesity, chronic diastolic CHF, HTN, HLD, hypothyroidism, obese, chronic CHF (diastolic) admitted for Tikosyn initiation  Previous antiarrhythmic drugs: propafenone  Previous cardioversions: 2016, 2018, 04/09/19 Previous ablations: 2016 flutter CHADS2VASC score: 4 Anticoagulation history: Eliquis  Assessment & Plan    1. Persistent Afib     CHA2DS2Vasc is 4, on Eliquis     Tikosyn initiation in progress     K+ 4.0     Mag 2.0     Creat 0.80     QTc is stable  DCCV today Anticipate discharge home tomorrow  Appreciate case management team, no PA required   2. HTN     Continue home meds  3. Chronic CHF (diastolic)     Appears compensated currently      On home regime  4. Hypothyroidism     On home med   For questions or updates, please contact [facility] Please consult [email or web address] for contact info under   Signed, Baldwin [place], PA-C  06/14/2019, 8:34 AM    EP Attending  Patient seen and examined. Agree with the findings as noted above. The patient has maintained NSR after DCCV. She appears to be otherwise doing well. I will plan for DC home tomorrow.   Mikle Bosworth.D.

## 2019-06-14 NOTE — Interval H&P Note (Signed)
History and Physical Interval Note:  [id number] AM  [name] [name]  has presented today for surgery, with the diagnosis of afib.  The various methods of treatment have been discussed with the patient and family. After consideration of risks, benefits and other options for treatment, the patient has consented to  Procedure(s): CARDIOVERSION (N/A) as a surgical intervention.  The patient's history has been reviewed, patient examined, no change in status, stable for surgery.  I have reviewed the patient's chart and labs.  Questions were answered to the patient's satisfaction.     Kirk Ruths

## 2019-06-14 NOTE — Anesthesia Procedure Notes (Signed)
Procedure Name: General with mask airway Date/Time: 06/14/2019 10:53 AM Performed by: Harden Mo, CRNA Pre-anesthesia Checklist: Patient identified, Emergency Drugs available, Suction available and Patient being monitored Patient Re-evaluated:Patient Re-evaluated prior to induction Oxygen Delivery Method: Ambu bag Preoxygenation: Pre-oxygenation with 100% oxygen Induction Type: IV induction Placement Confirmation: positive ETCO2 and breath sounds checked- equal and bilateral Dental Injury: Teeth and Oropharynx as per pre-operative assessment

## 2019-06-14 NOTE — Anesthesia Postprocedure Evaluation (Signed)
Anesthesia Post Note  Patient: [name] [name]  Procedure(s) Performed: CARDIOVERSION (N/A )     Patient location during evaluation: PACU Anesthesia Type: General Level of consciousness: awake and alert and oriented Pain management: pain level controlled Vital Signs Assessment: post-procedure vital signs reviewed and stable Respiratory status: spontaneous breathing, nonlabored ventilation and respiratory function stable Cardiovascular status: blood pressure returned to baseline and stable Postop Assessment: no apparent nausea or vomiting Anesthetic complications: no    Last Vitals:  Vitals:   06/14/19 1013 06/14/19 1105  BP: 119/79 (!) 111/55  Pulse: (!) 101 (!) 49  Resp: 17 18  Temp: (!) 36.3 C 36.7 C  SpO2: 100% 100%    Last Pain:  Vitals:   06/14/19 1105  TempSrc: Oral  PainSc: 0-No pain                 Quandarius Nill A.

## 2019-06-14 NOTE — Procedures (Signed)
Electrical Cardioversion Procedure Note [name] [name] [id number] [birth year]  Procedure: Electrical Cardioversion Indications:  Atrial Fibrillation  Procedure Details Consent: Risks of procedure as well as the alternatives and risks of each were explained to the (patient/caregiver).  Consent for procedure obtained. Time Out: Verified patient identification, verified procedure, site/side was marked, verified correct patient position, special equipment/implants available, medications/allergies/relevent history reviewed, required imaging and test results available.  Performed  Patient placed on cardiac monitor, pulse oximetry, supplemental oxygen as necessary.  Sedation given: Pt sedated by anesthesia with lidocaine 50 mg and diprovan 50 mg IV. Pacer pads placed anterior and posterior chest.  Cardioverted 1 time(s).  Cardioverted at [place].  Evaluation Findings: Post procedure EKG shows: Sinus bradycardia Complications: None Patient did tolerate procedure well.   Kirk Ruths 06/14/2019, 10:59 AM

## 2019-06-14 NOTE — Progress Notes (Signed)
Pharmacy: Dofetilide (Tikosyn) - Follow Up Assessment and Electrolyte Replacement  Pharmacy consulted to assist in monitoring and replacing electrolytes in this 69 y.o. female admitted on 06/12/2019 undergoing dofetilide initiation. First dofetilide dose: 6/1@2020 .  Labs:    Component Value Date/Time   K 4.0 06/14/2019 0215   MG 2.0 06/14/2019 0215     Plan: Potassium: K >/= 4: No additional supplementation needed  Magnesium: Mg 1.8-2: Give Mg 2 gm IV x1    Thank you for allowing pharmacy to participate in this patient's care   Antonietta Jewel, PharmD, [facility] Pharmacist  Phone: [phone] 06/14/2019 7:20 AM  Please check AMION for all [facility] phone numbers After 10:00 PM, call [facility] [phone]

## 2019-06-14 NOTE — Transfer of Care (Signed)
Immediate Anesthesia Transfer of Care Note  Patient: [name]  Procedure(s) Performed: CARDIOVERSION (N/A )  Patient Location: Endoscopy Unit  Anesthesia Type:General  Level of Consciousness: awake and drowsy  Airway & Oxygen Therapy: Patient Spontanous Breathing and Patient connected to nasal cannula oxygen  Post-op Assessment: Report given to RN, Post -op Vital signs reviewed and stable and Patient moving all extremities X 4  Post vital signs: Reviewed and stable  Last Vitals:  Vitals Value Taken Time  BP    Temp    Pulse    Resp    SpO2      Last Pain:  Vitals:   06/14/19 1013  TempSrc: Temporal  PainSc: 0-No pain      Patients Stated Pain Goal: 0 ([id number])  Complications: No apparent anesthesia complications

## 2019-06-14 NOTE — Anesthesia Preprocedure Evaluation (Signed)
Anesthesia Evaluation  Patient identified by MRN, date of birth, ID band Patient awake    Reviewed: Allergy & Precautions, NPO status , Patient's Chart, lab work & pertinent test results, reviewed documented beta blocker date and time   History of Anesthesia Complications (+) PONV and history of anesthetic complications  Airway Mallampati: II  TM Distance: >3 FB Neck ROM: Full    Dental no notable dental hx. (+) Teeth Intact, Dental Advisory Given   Pulmonary asthma , pneumonia, resolved, COPD,  COPD inhaler and oxygen dependent, Recent URI , former smoker,    Pulmonary exam normal breath sounds clear to auscultation       Cardiovascular hypertension, Pt. on medications and Pt. on home beta blockers + Peripheral Vascular Disease, +CHF and + DVT  Normal cardiovascular exam+ dysrhythmias Atrial Fibrillation  Rhythm:Regular Rate:Tachycardia     Neuro/Psych PSYCHIATRIC DISORDERS Anxiety Depression  Neuromuscular disease    GI/Hepatic Neg liver ROS, GERD  Medicated and Controlled,  Endo/Other  Hypothyroidism Morbid obesity  Renal/GU negative Renal ROS  negative genitourinary   Musculoskeletal  (+) Arthritis , Osteoarthritis,    Abdominal   Peds  Hematology  (+) anemia , Eliquis- last dose this am   Anesthesia Other Findings   Reproductive/Obstetrics                             Anesthesia Physical  Anesthesia Plan  ASA: III  Anesthesia Plan: General   Post-op Pain Management:    Induction: Intravenous  PONV Risk Score and Plan:   Airway Management Planned: Natural Airway and Mask  Additional Equipment:   Intra-op Plan:   Post-operative Plan:   Informed Consent: I have reviewed the patients History and Physical, chart, labs and discussed the procedure including the risks, benefits and alternatives for the proposed anesthesia with the patient or authorized representative who has  indicated his/her understanding and acceptance.     Dental advisory given  Plan Discussed with: CRNA and Anesthesiologist  Anesthesia Plan Comments:         Anesthesia Quick Evaluation

## 2019-06-15 LAB — CBC
HCT: 33.6 % — ABNORMAL LOW (ref 36.0–46.0)
Hemoglobin: 10.3 g/dL — ABNORMAL LOW (ref 12.0–15.0)
MCH: 27.2 pg (ref 26.0–34.0)
MCHC: 30.7 g/dL (ref 30.0–36.0)
MCV: 88.9 fL (ref 80.0–100.0)
Platelets: 235 10*3/uL (ref 150–400)
RBC: 3.78 MIL/uL — ABNORMAL LOW (ref 3.87–5.11)
RDW: 15.2 % (ref 11.5–15.5)
WBC: 5.7 10*3/uL (ref 4.0–10.5)
nRBC: 0 % (ref 0.0–0.2)

## 2019-06-15 LAB — BASIC METABOLIC PANEL
Anion gap: 6 (ref 5–15)
BUN: 14 mg/dL (ref 8–23)
CO2: 34 mmol/L — ABNORMAL HIGH (ref 22–32)
Calcium: 8.5 mg/dL — ABNORMAL LOW (ref 8.9–10.3)
Chloride: 101 mmol/L (ref 98–111)
Creatinine, Ser: 0.86 mg/dL (ref 0.44–1.00)
GFR calc Af Amer: >60 mL/min (ref >60)
GFR calc non Af Amer: >60 mL/min (ref >60)
Glucose, Bld: 142 mg/dL — ABNORMAL HIGH (ref 70–99)
Potassium: 4.3 mmol/L (ref 3.5–5.1)
Sodium: 141 mmol/L (ref 135–145)

## 2019-06-15 LAB — MAGNESIUM: Magnesium: 2.4 mg/dL (ref 1.7–2.4)

## 2019-06-15 MED ORDER — METOPROLOL TARTRATE 50 MG PO TABS
100.0000 mg | ORAL_TABLET | Freq: Two times a day (BID) | ORAL | 6 refills | Status: DC
Start: 2019-06-15 — End: 2019-06-21

## 2019-06-15 MED ORDER — DOFETILIDE 500 MCG PO CAPS: 500.0000 ug | ORAL_CAPSULE | Freq: Two times a day (BID) | ORAL | 6 refills | Status: AC

## 2019-06-15 MED FILL — DOFETILIDE 500 MCG CAPS: 500 | 30 days supply | Qty: 60 | Fill #0

## 2019-06-15 NOTE — Plan of Care (Signed)
Pt being discharged to home with self care, d/c education completed.

## 2019-06-15 NOTE — Care Management Important Message (Signed)
Important Message  Patient Details  Name: JANDI SWIGER MRN: [id number] Date of Birth: 07-Oct-1950   Medicare Important Message Given:  Yes     Shelda Altes 06/15/2019, 1:11 PM

## 2019-06-15 NOTE — Progress Notes (Signed)
Pharmacy: Dofetilide (Tikosyn) - Follow Up Assessment and Electrolyte Replacement  Pharmacy consulted to assist in monitoring and replacing electrolytes in this 69 y.o. female admitted on 06/12/2019 undergoing dofetilide initiation.   Labs:    Component Value Date/Time   K 4.3 06/15/2019 0510   MG 2.4 06/15/2019 0510     Plan: Potassium: K >/= 4: No additional supplementation needed  Magnesium: Mg > 2: No additional supplementation needed   As patient has required on average 40 mEq of potassium replacement every day, recommend discharging patient with prescription for:  Potassium chloride 20 mEq  twice daily (was on this prior to admission)  Thank you for allowing pharmacy to participate in this patient's care   Hildred Laser, PharmD Clinical Pharmacist **Pharmacist phone directory can now be found on [email or web address] ([email or web address]).  Listed under [facility].

## 2019-06-15 NOTE — Discharge Instructions (Signed)

## 2019-06-18 ENCOUNTER — Telehealth: Payer: Self-pay | Admitting: *Deleted

## 2019-06-18 NOTE — Telephone Encounter (Signed)
Contacted regarding scheduling lung screening scan. Patient reports she is still recuperating from recent procedures and would like to consider lung screening in the future.

## 2019-06-21 ENCOUNTER — Other Ambulatory Visit: Payer: Self-pay

## 2019-06-21 ENCOUNTER — Ambulatory Visit (HOSPITAL_COMMUNITY)
Admit: 2019-06-21 | Discharge: 2019-06-21 | Disposition: A | Discharge status: Home / Self Care | Payer: Medicare Other | Attending: Nurse Practitioner | Admitting: Nurse Practitioner

## 2019-06-21 VITALS — BP 134/60 | HR 46 | Ht 62.0 in | Wt 243.8 lb

## 2019-06-21 DIAGNOSIS — J449 Chronic obstructive pulmonary disease, unspecified: Secondary | ICD-10-CM | POA: Diagnosis not present

## 2019-06-21 DIAGNOSIS — Z87891 Personal history of nicotine dependence: Secondary | ICD-10-CM | POA: Diagnosis not present

## 2019-06-21 DIAGNOSIS — I4891 Unspecified atrial fibrillation: Secondary | ICD-10-CM

## 2019-06-21 DIAGNOSIS — E039 Hypothyroidism, unspecified: Secondary | ICD-10-CM | POA: Insufficient documentation

## 2019-06-21 DIAGNOSIS — I4819 Other persistent atrial fibrillation: Secondary | ICD-10-CM | POA: Insufficient documentation

## 2019-06-21 DIAGNOSIS — Z8379 Family history of other diseases of the digestive system: Secondary | ICD-10-CM | POA: Insufficient documentation

## 2019-06-21 DIAGNOSIS — Z888 Allergy status to other drugs, medicaments and biological substances status: Secondary | ICD-10-CM | POA: Diagnosis not present

## 2019-06-21 DIAGNOSIS — F419 Anxiety disorder, unspecified: Secondary | ICD-10-CM | POA: Insufficient documentation

## 2019-06-21 DIAGNOSIS — Z86718 Personal history of other venous thrombosis and embolism: Secondary | ICD-10-CM | POA: Diagnosis not present

## 2019-06-21 DIAGNOSIS — Z800 Family history of malignant neoplasm of digestive organs: Secondary | ICD-10-CM | POA: Insufficient documentation

## 2019-06-21 DIAGNOSIS — Z8249 Family history of ischemic heart disease and other diseases of the circulatory system: Secondary | ICD-10-CM | POA: Insufficient documentation

## 2019-06-21 DIAGNOSIS — I110 Hypertensive heart disease with heart failure: Secondary | ICD-10-CM | POA: Insufficient documentation

## 2019-06-21 DIAGNOSIS — Z825 Family history of asthma and other chronic lower respiratory diseases: Secondary | ICD-10-CM | POA: Insufficient documentation

## 2019-06-21 DIAGNOSIS — Z7901 Long term (current) use of anticoagulants: Secondary | ICD-10-CM | POA: Diagnosis not present

## 2019-06-21 DIAGNOSIS — Z91040 Latex allergy status: Secondary | ICD-10-CM | POA: Insufficient documentation

## 2019-06-21 DIAGNOSIS — I5032 Chronic diastolic (congestive) heart failure: Secondary | ICD-10-CM | POA: Diagnosis not present

## 2019-06-21 DIAGNOSIS — Z853 Personal history of malignant neoplasm of breast: Secondary | ICD-10-CM | POA: Insufficient documentation

## 2019-06-21 DIAGNOSIS — D6869 Other thrombophilia: Secondary | ICD-10-CM | POA: Insufficient documentation

## 2019-06-21 DIAGNOSIS — Z79890 Hormone replacement therapy (postmenopausal): Secondary | ICD-10-CM | POA: Insufficient documentation

## 2019-06-21 DIAGNOSIS — Z6841 Body Mass Index (BMI) 40.0 and over, adult: Secondary | ICD-10-CM | POA: Insufficient documentation

## 2019-06-21 DIAGNOSIS — E785 Hyperlipidemia, unspecified: Secondary | ICD-10-CM | POA: Insufficient documentation

## 2019-06-21 DIAGNOSIS — Z86711 Personal history of pulmonary embolism: Secondary | ICD-10-CM | POA: Diagnosis not present

## 2019-06-21 DIAGNOSIS — Z880 Allergy status to penicillin: Secondary | ICD-10-CM | POA: Diagnosis not present

## 2019-06-21 DIAGNOSIS — I4892 Unspecified atrial flutter: Secondary | ICD-10-CM | POA: Insufficient documentation

## 2019-06-21 DIAGNOSIS — I480 Paroxysmal atrial fibrillation: Secondary | ICD-10-CM | POA: Diagnosis present

## 2019-06-21 DIAGNOSIS — Z79899 Other long term (current) drug therapy: Secondary | ICD-10-CM | POA: Insufficient documentation

## 2019-06-21 LAB — BASIC METABOLIC PANEL
Anion gap: 10 (ref 5–15)
BUN: 14 mg/dL (ref 8–23)
CO2: 28 mmol/L (ref 22–32)
Calcium: 8.7 mg/dL — ABNORMAL LOW (ref 8.9–10.3)
Chloride: 102 mmol/L (ref 98–111)
Creatinine, Ser: 0.83 mg/dL (ref 0.44–1.00)
GFR calc Af Amer: >60 mL/min (ref >60)
GFR calc non Af Amer: >60 mL/min (ref >60)
Glucose, Bld: 111 mg/dL — ABNORMAL HIGH (ref 70–99)
Potassium: 4.2 mmol/L (ref 3.5–5.1)
Sodium: 140 mmol/L (ref 135–145)

## 2019-06-21 LAB — CBC
HCT: 35.3 % — ABNORMAL LOW (ref 36.0–46.0)
Hemoglobin: 11.3 g/dL — ABNORMAL LOW (ref 12.0–15.0)
MCH: 28.1 pg (ref 26.0–34.0)
MCHC: 32 g/dL (ref 30.0–36.0)
MCV: 87.8 fL (ref 80.0–100.0)
Platelets: 259 10*3/uL (ref 150–400)
RBC: 4.02 MIL/uL (ref 3.87–5.11)
RDW: 15.1 % (ref 11.5–15.5)
WBC: 6.3 10*3/uL (ref 4.0–10.5)
nRBC: 0 % (ref 0.0–0.2)

## 2019-06-21 LAB — MAGNESIUM: Magnesium: 2.3 mg/dL (ref 1.7–2.4)

## 2019-06-21 MED ORDER — METOPROLOL TARTRATE 50 MG PO TABS: 50.0000 mg | ORAL_TABLET | Freq: Two times a day (BID) | ORAL | 6 refills | Status: DC

## 2019-06-21 NOTE — Progress Notes (Addendum)
Primary Care Physician: McLean-Scocuzza, Nino Glow, MD Primary Cardiologist: Dr Stanford Breed Primary Electrophysiologist: Dr Lovena Le Referring Physician: Dr Hansel Starling is a 69 y.o. female with a history of paroxysmal atrial fibrillation, prior PE, atrial flutter, COPD, morbid obesity, chronic diastolic CHF, HTN who presents for follow up in the [facility]. The patient was initially diagnosed with atrial flutter in 2016 and underwent flutter ablation by Dr Lovena Le. She later developed afib and was started on propafenone. Patient is on Eliquis for a CHADS2VASC score of 4. Patient is s/p DCCV on 04/09/19 but unfortunately was back in afib on follow up with Coletta Memos and Dr Stanford Breed who recommended dofetilide. Of note, she had her second COVID shot two days prior to her afib onset. She does have symptoms of palpitations and fatigue when she is in afib.   On follow up today, she presents for dofetilide admission. She remains in rate controlled afib today with symptoms of fatigue and palpitations. She denies any missed doses of anticoagulation in the last 3 weeks.   F/u in afib clinic, 6/10, post hospitalization for dofetilide load. She is in Sinus brady at 46 bpm. She  is not symptomatic with  This.  Qt is acceptable but at 495 ms, but would prefer to be shorter.  She did reduce metoprolol to 50 mg bid on d/c for brady seen in the hospital. We discussed reducing BB dose further but she is afraid this may trigger afib and would prefer to wait.  Today, she denies symptoms of chest pain, orthopnea, PND, lower extremity edema, dizziness, presyncope, syncope, snoring, daytime somnolence, bleeding, or neurologic sequela. The patient is tolerating medications without difficulties and is otherwise without complaint today.    Atrial Fibrillation Risk Factors:  she does not have symptoms or diagnosis of sleep apnea. she does not have a history of rheumatic  fever.   she has a BMI of Body mass index is 44.59 kg/m.Marland Kitchen Filed Weights   06/21/19 1055  Weight: 110.6 kg    Family History  Problem Relation Age of Onset  . Heart disease Mother   . Liver disease Mother        alcohol related  . Asthma Brother   . Esophageal cancer Brother   . Asthma Son   . Colon cancer Neg Hx   . Stomach cancer Neg Hx   . Pancreatic cancer Neg Hx   . Inflammatory bowel disease Neg Hx   . Allergic rhinitis Neg Hx   . Eczema Neg Hx   . Urticaria Neg Hx      Atrial Fibrillation Management history:  Previous antiarrhythmic drugs: propafenone  Previous cardioversions: 2016, 2018, 04/09/19 Previous ablations: 2016 flutter CHADS2VASC score: 4 Anticoagulation history: Eliquis   Past Medical History:  Diagnosis Date  . Anemia    hx  . Anxiety   . Arthritis   . Asthma   . Atrial fibrillation and flutter ([place])   . Atypical lobular hyperplasia of right breast 05/09/2015  . Breast CA ([place])    ?  Marland Kitchen Chronic rhinitis   . COPD (chronic obstructive pulmonary disease) ([place])    [place]. PFTs 07/25/06 FEV1 55% ratio 53, DLC0 51% HFA 50% 12/16/2009  . Depression   . FRACTURE, RIB, RIGHT 06/18/2009  . Glucose intolerance (impaired glucose tolerance)    on steroids  . History of DVT (deep vein thrombosis)   . Hx of cardiac catheterization    LHC (01/2001):  Normal Cors.  EF 65%.;  LexiScan Myoview (03/2013): No ischemia, EF 61%, normal  . Hx of echocardiogram    Echo (11/2011):  Mod LVH, EF 60-65%, no RWMA, MAC, mild LAE, PASP 34.  Marland Kitchen HYPERLIPIDEMIA   . Hypertension   . Hypothyroidism   . Morbid obesity ([place])    target wt=179lb for BMI<30 Peak wt 282lb  . Osteoporosis    last BMD 4/08 -2.4, intolerant of bisphosphonates  . Peripheral vascular disease ([facility])    hx dvt.pe  . Pneumonia    hx  . PONV (postoperative nausea and vomiting)   . PREMATURE VENTRICULAR CONTRACTIONS   . PULMONARY EMBOLISM, HX OF 06/19/2007  . Recurrent upper respiratory infection (URI)   .  Rhabdomyolysis 07/29/2009  . Tremor   . VITAMIN D DEFICIENCY    Past Surgical History:  Procedure Laterality Date  . A-FLUTTER ABLATION N/A 10/08/2016   Procedure: A-FLUTTER ABLATION;  Surgeon: Evans Lance, MD;  Location: [facility];  Service: Cardiovascular;  Laterality: N/A;  . ABDOMINAL HYSTERECTOMY    . BREAST LUMPECTOMY WITH RADIOACTIVE SEED LOCALIZATION Right 06/05/2015   Procedure: RIGHT BREAST LUMPECTOMY WITH RADIOACTIVE SEED LOCALIZATION;  Surgeon: Fanny Skates, MD;  Location: [facility];  Service: General;  Laterality: Right;  . BREAST SURGERY Left    s/p mass removal  . CARDIOVERSION N/A 12/11/2014   Procedure: CARDIOVERSION;  Surgeon: Josue Hector, MD;  Location: [place];  Service: Cardiovascular;  Laterality: N/A;  . CARDIOVERSION N/A 09/15/2016   Procedure: CARDIOVERSION;  Surgeon: Jerline Pain, MD;  Location: [facility] ENDOSCOPY;  Service: Cardiovascular;  Laterality: N/A;  . CARDIOVERSION N/A 04/09/2019   Procedure: CARDIOVERSION;  Surgeon: Pixie Casino, MD;  Location: [facility] ENDOSCOPY;  Service: Cardiovascular;  Laterality: N/A;  . CARDIOVERSION N/A 06/14/2019   Procedure: CARDIOVERSION;  Surgeon: Lelon Perla, MD;  Location: [facility] ENDOSCOPY;  Service: Cardiovascular;  Laterality: N/A;  . COLONOSCOPY  10/22/2003, ?2006  . COLONOSCOPY WITH PROPOFOL N/A 01/15/2016   Procedure: COLONOSCOPY WITH PROPOFOL;  Surgeon: Gatha Mayer, MD;  Location: [facility] ENDOSCOPY;  Service: Endoscopy;  Laterality: N/A;  . ESOPHAGOGASTRODUODENOSCOPY (EGD) WITH PROPOFOL N/A 01/15/2016   Procedure: ESOPHAGOGASTRODUODENOSCOPY (EGD) WITH PROPOFOL;  Surgeon: Gatha Mayer, MD;  Location: [facility] ENDOSCOPY;  Service: Endoscopy;  Laterality: N/A;  . s/p left arm fracture with fall off chair    . skin graft to middle R finger  1975  . TEE WITHOUT CARDIOVERSION N/A 12/11/2014   Procedure: TRANSESOPHAGEAL ECHOCARDIOGRAM (TEE);  Surgeon: Josue Hector, MD;  Location: [facility] ENDOSCOPY;  Service: Cardiovascular;   Laterality: N/A;  . TONSILLECTOMY      Current Outpatient Medications  Medication Sig Dispense Refill  . albuterol (VENTOLIN HFA) 108 (90 Base) MCG/ACT inhaler Inhale 2 puffs into the lungs as needed for wheezing or shortness of breath.     Marland Kitchen amLODipine (NORVASC) 5 MG tablet Take 1 tablet (5 mg total) by mouth daily. 180 tablet 3  . atorvastatin (LIPITOR) 10 MG tablet Take 1 tablet (10 mg total) by mouth daily at 6 PM. 90 tablet 3  . azelastine (ASTELIN) 0.1 % nasal spray Place 2 sprays into both nostrils at bedtime as needed for rhinitis. Use in each nostril as directed 90 mL 3  . benzonatate (TESSALON) 200 MG capsule Take 1 capsule (200 mg total) by mouth 3 (three) times daily as needed for cough. (Patient taking differently: Take 200 mg by mouth as needed for cough. ) 30 capsule 5  . Calcium Carbonate-Vitamin D  600-400 MG-UNIT per tablet Take 1 tablet by mouth daily.     Marland Kitchen dofetilide (TIKOSYN) 500 MCG capsule Take 1 capsule (500 mcg total) by mouth 2 (two) times daily. 60 capsule 6  . ELIQUIS 5 MG TABS tablet TAKE 1 TABLET BY MOUTH  TWICE A DAY (Patient taking differently: Take 5 mg by mouth 2 (two) times daily. ) 180 tablet 1  . famotidine (PEPCID) 20 MG tablet Take 1 tablet (20 mg total) by mouth daily. At bedtime 90 tablet 3  . ferrous sulfate 325 (65 FE) MG tablet Take 325 mg by mouth 2 (two) times daily with a meal.    . FLUoxetine (PROZAC) 40 MG capsule Take 1 capsule (40 mg total) by mouth daily. 90 capsule 3  . Fluticasone-Umeclidin-Vilant (TRELEGY ELLIPTA) 100-62.5-25 MCG/INH AEPB Inhale 1 puff into the lungs daily. 180 each 3  . furosemide (LASIX) 80 MG tablet TAKE 1 TABLET BY MOUTH IN  THE MORNING AND 1 TABLET IN THE AFTERNOON (Patient taking differently: Take 80 mg by mouth 2 (two) times daily. MORNING AND AFTERNOON) 180 tablet 3  . Glucosamine-Chondroitin (COSAMIN DS PO) Take 1 tablet by mouth daily.    Marland Kitchen KLOR-CON M20 20 MEQ tablet TAKE 1 TABLET BY MOUTH  TWICE DAILY (Patient  taking differently: Take 20 mEq by mouth 2 (two) times daily. ) 180 tablet 3  . levalbuterol (XOPENEX) 1.25 MG/3ML nebulizer solution Take 1.25 mg by nebulization every 4 (four) hours. (Patient taking differently: Take 1.25 mg by nebulization as needed for wheezing or shortness of breath. ) 360 mL 11  . levocetirizine (XYZAL) 5 MG tablet Take 1 tablet (5 mg total) by mouth at bedtime as needed for allergies. 90 tablet 3  . levothyroxine (SYNTHROID) 50 MCG tablet Take 1 tablet (50 mcg total) by mouth daily. mylan brand 90 tablet 3  . metoprolol tartrate (LOPRESSOR) 50 MG tablet Take 1 tablet (50 mg total) by mouth 2 (two) times daily. 60 tablet 6  . olopatadine (PATANOL) 0.1 % ophthalmic solution Place 1 drop into both eyes 2 (two) times daily. 15 mL 4  . OXYGEN Inhale 2.5 L/min into the lungs continuous. And 4 with exertion    . pantoprazole (PROTONIX) 40 MG tablet Take 40 mg by mouth daily.     No current facility-administered medications for this encounter.    Allergies  Allergen Reactions  . Duloxetine Other (See Comments)    REACTION: rhabdomyolysis  . Penicillins Other (See Comments)    SYNCOPE Has patient had a PCN reaction causing immediate rash, facial/tongue/throat swelling, SOB or lightheadedness with hypotension: Yes Has patient had a PCN reaction causing severe rash involving mucus membranes or skin necrosis: No Has patient had a PCN reaction that required hospitalization No Has patient had a PCN reaction occurring within the last 10 years: No If all of the above answers are "NO", then may proceed with Cephalosporin use.   . Latex Rash    Social History   Socioeconomic History  . Marital status: Married    Spouse name: Not on file  . Number of children: 3  . Years of education: Not on file  . Highest education level: Not on file  Occupational History  . 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 Smoking status: Former  Smoker    Packs/day: 1.00    Years: 35.00    Pack years: 35.00    Types: Cigarettes    Quit date: 01/11/2006  Years since quitting: 13.4  . Smokeless tobacco: Never Used  Vaping Use  . Vaping Use: Never used  Substance and Sexual Activity  . Alcohol use: No    Alcohol/week: 0.0 standard drinks  . Drug use: No  . Sexual activity: Yes  Other Topics Concern  . Not on file  Social History Narrative   Married 1 son 2 daughters   Disabled   2 caffeine/day   Past smoker   11/12/2015   Social Determinants of Health   Financial Resource Strain:   . Difficulty of Paying Living Expenses:   Food Insecurity:   . Worried About Charity fundraiser in the Last Year:   . Arboriculturist in the Last Year:   Transportation Needs:   . Film/video editor (Medical):   Marland Kitchen Lack of Transportation (Non-Medical):   Physical Activity:   . Days of Exercise per Week:   . Minutes of Exercise per Session:   Stress:   . Feeling of Stress :   Social Connections:   . Frequency of Communication with Friends and Family:   . Frequency of Social Gatherings with Friends and Family:   . Attends Religious Services:   . Active Member of Clubs or Organizations:   . Attends Archivist Meetings:   Marland Kitchen Marital Status:   Intimate Partner Violence:   . Fear of Current or Ex-Partner:   . Emotionally Abused:   Marland Kitchen Physically Abused:   . Sexually Abused:      ROS- All systems are reviewed and negative except as per the HPI above.  Physical Exam: Vitals:   06/21/19 1055  BP: 134/60  Pulse: (!) 46  Weight: 110.6 kg  Height: _0  (1.575 m)    GEN- The patient is well appearing obese female, alert and oriented x 3 today.   HEENT-head normocephalic, atraumatic, sclera clear, conjunctiva pink, hearing intact, trachea midline. Lungs- Clear to ausculation bilaterally, diminished breath sounds, normal work of breathing Heart- regular rate and rhythm, no murmurs, rubs or gallops  GI- soft, NT, ND, +  BS Extremities- no clubbing, cyanosis, or edema MS- no significant deformity or atrophy Skin- no rash or lesion Psych- euthymic mood, full affect Neuro- strength and sensation are intact   Wt Readings from Last 3 Encounters:  06/21/19 110.6 kg  06/15/19 112.5 kg  06/12/19 112.5 kg    EKG today demonstrates sinus brady at 46 bpm, pr int 140 ms, qrs int 78, qtc 495 ms   Echo 07/17/18 demonstrated  1. The left ventricle has normal systolic function with an ejection  fraction of 60-65%. The cavity size was normal. There is moderately  increased left ventricular wall thickness. Left ventricular diastolic  Doppler parameters are consistent with  pseudonormalization. Elevated left ventricular end-diastolic pressure The E/e' is >20.  2. The right ventricle has normal systolic function. The cavity was  normal. There is no increase in right ventricular wall thickness. Right  ventricular systolic pressure is mildly elevated with an estimated  pressure of 32.2 mmHg.  3. Left atrial size was severely dilated.  4. The aortic valve is abnormal. Mild calcification of the aortic valve.  Aortic valve regurgitation is trivial by color flow Doppler. Mild stenosis  of the aortic valve. Mean gradient 11 mmHg. Difference in mean gradients between exams may be secondary  to increased flow velocities across LVOT on prior study.  5. The mitral valve is abnormal. There is moderate to severe mitral  annular calcification present.  Mean diastolic gradient 3 mmHg at HR 64  bpm.   Epic records are reviewed at length today  CHA2DS2-VASc Score = 4  The patient's score is based upon: CHF History: 0 HTN History: 1 Age : 1 Diabetes History: 0 Stroke History: 0 Vascular Disease History: 1 Gender: 1      ASSESSMENT AND PLAN: 1. Persistent Atrial Fibrillation/atrial flutter The patient's CHA2DS2-VASc score is 4, indicating a 4.8% annual risk of stroke.   Patient is s/p   dofetilide admission.  Qtc is  at upper limits of normal today at 495 ms  Fluoxetine, Trelegy, and Xopenex can cause QT prolongation but are not contraindicated, will monitor closely.  Bmet/mag today  Continue Eliquis 5 mg BID. Patient denies any missed doses in the last 3 weeks. Continue Lopressor at 50  mg BID, dose cut in half at time of discharge Discussed with pt  reducing again today with HR in the upper 40's but she feels fine and does not want to possibly induce afib  2. Secondary Hypercoagulable State (ICD10:  D68.69) The patient is at significant risk for stroke/thromboembolism based upon her CHA2DS2-VASc Score of 4.  Continue Apixaban (Eliquis).   3. Obesity Body mass index is 44.59 kg/m. Lifestyle modification was discussed and encouraged including regular physical activity and weight reduction.  4. Chronic diastolic CHF No signs or symptoms of fluid overload.  5. HTN Stable, no changes today.   Follow up in one week for EKG to look at qt and HR   [employer] C. Mila [facility] [place], [place] [address] [phone]  06/21/2019 1:32 PM

## 2019-06-22 ENCOUNTER — Other Ambulatory Visit: Payer: Medicare Other

## 2019-06-22 ENCOUNTER — Ambulatory Visit: Payer: Medicare Other

## 2019-06-27 ENCOUNTER — Ambulatory Visit (HOSPITAL_COMMUNITY)
Admission: RE | Admit: 2019-06-27 | Discharge: 2019-06-27 | Disposition: A | Discharge status: Home / Self Care | Payer: Medicare Other | Source: Ambulatory Visit | Attending: Physician Assistant | Admitting: Physician Assistant

## 2019-06-27 ENCOUNTER — Other Ambulatory Visit: Payer: Self-pay

## 2019-06-27 VITALS — BP 118/74 | HR 110

## 2019-06-27 DIAGNOSIS — I4891 Unspecified atrial fibrillation: Secondary | ICD-10-CM | POA: Diagnosis not present

## 2019-06-27 DIAGNOSIS — I4819 Other persistent atrial fibrillation: Secondary | ICD-10-CM

## 2019-06-27 NOTE — Progress Notes (Signed)
Patient returns for ECG today. ECG shows afib HR 110, PVCs, QRS 76, QTc 470. She checks her heart rate at home frequently and have been 50-70s. Patient reports that one other day in the last week she noted a heart rate of ~100 bpm. She is surprised she is in afib today because she is feeling well with none of her typical afib symptoms. She appears paroxysmal, will monitor for now. Patient instructed to contact us if she notes symptoms with her afib or her heart rates are persistently elevated. Otherwise, follow up with Dr Lovena Le as scheduled.

## 2019-07-02 ENCOUNTER — Telehealth: Payer: Self-pay | Admitting: Pulmonary Disease

## 2019-07-02 ENCOUNTER — Telehealth: Payer: Self-pay | Admitting: Internal Medicine

## 2019-07-02 DIAGNOSIS — J449 Chronic obstructive pulmonary disease, unspecified: Secondary | ICD-10-CM | POA: Diagnosis not present

## 2019-07-02 NOTE — Telephone Encounter (Signed)
Patient called and advised to provide her jury duty summons to our office so we can write the excuse letter. Patient will provide and we will have Dr. Valeta Harms write and sign. Waiting for summons.

## 2019-07-02 NOTE — Telephone Encounter (Signed)
error 

## 2019-07-04 NOTE — Telephone Encounter (Signed)
Okay to generate letter stating that she is O2 dependent and this limits her mobility as well as she is an increased risk of obtaining a infection in the general public.  I believe this should excuse her sufficiently from jury duty.  It was my impression that anyone over the age of 65 could automatically be excused.  She may need to investigate this with the court house due to COVID-19.Okay to generate letter and I can sign on Dr. Juline Patch behalf.Wyn Quaker, FNP

## 2019-07-04 NOTE — Telephone Encounter (Signed)
From my understanding this is something that she must request from the court house.   I know that she is O2 dependent and this likely limits her mobility and time away from home.  I am ok with it. Maybe BM or TP willing to sign letter for me in my absence.  Thanks Garner Nash, DO [facility] 07/04/2019 11:03 AM

## 2019-07-04 NOTE — Telephone Encounter (Signed)
Dr. Valeta Harms - please advise if you are ok with excusing the pt from jury duty. Thanks.

## 2019-07-04 NOTE — Telephone Encounter (Signed)
Letter written and placed up front. I called pt to make her aware. She will come by to pick it up. Nothing further is needed.

## 2019-07-09 ENCOUNTER — Other Ambulatory Visit: Payer: Medicare Other

## 2019-07-09 ENCOUNTER — Ambulatory Visit: Payer: Medicare Other

## 2019-07-10 ENCOUNTER — Other Ambulatory Visit: Payer: Self-pay | Admitting: *Deleted

## 2019-07-10 ENCOUNTER — Telehealth: Payer: Self-pay | Admitting: Pulmonary Disease

## 2019-07-10 MED ORDER — TRELEGY ELLIPTA 100-62.5-25 MCG/INH IN AEPB: 1.0000 | INHALATION_SPRAY | Freq: Every day | RESPIRATORY_TRACT | 11 refills | Status: DC

## 2019-07-10 NOTE — Telephone Encounter (Signed)
[place] Patient assistance application received.  Completed form with Trelegy prescrition faxed to [place] at [phone].  Confirmation received. Nothing further at this time.

## 2019-07-13 ENCOUNTER — Encounter: Payer: Self-pay | Admitting: Internal Medicine

## 2019-07-13 ENCOUNTER — Ambulatory Visit: Payer: Medicare Other | Admitting: Internal Medicine

## 2019-07-13 ENCOUNTER — Other Ambulatory Visit: Payer: Self-pay

## 2019-07-13 VITALS — Ht 62.0 in | Wt 244.0 lb

## 2019-07-13 DIAGNOSIS — I483 Typical atrial flutter: Secondary | ICD-10-CM

## 2019-07-13 DIAGNOSIS — I4819 Other persistent atrial fibrillation: Secondary | ICD-10-CM | POA: Diagnosis not present

## 2019-07-13 NOTE — Progress Notes (Signed)
HPI Ms. Ferrufino returns today for followup of atrial fib. She is a morbidly obese 69 yo woman with a h/o HTN, obesity and persistent atrial fib. She wears chronic oxygen therapy. She has been hospitalized with the initiation of dofetilide. She has occaisional palpitations. No chest pain or sob. No syncope. Her only complaint is that she has 1-2 loose bm's a day. Allergies  Allergen Reactions  . Duloxetine Other (See Comments)    REACTION: rhabdomyolysis  . Penicillins Other (See Comments)    SYNCOPE Has patient had a PCN reaction causing immediate rash, facial/tongue/throat swelling, SOB or lightheadedness with hypotension: Yes Has patient had a PCN reaction causing severe rash involving mucus membranes or skin necrosis: No Has patient had a PCN reaction that required hospitalization No Has patient had a PCN reaction occurring within the last 10 years: No If all of the above answers are "NO", then may proceed with Cephalosporin use.   . Latex Rash     Current Outpatient Medications  Medication Sig Dispense Refill  . albuterol (VENTOLIN HFA) 108 (90 Base) MCG/ACT inhaler Inhale 2 puffs into the lungs as needed for wheezing or shortness of breath.     Marland Kitchen atorvastatin (LIPITOR) 10 MG tablet Take 1 tablet (10 mg total) by mouth daily at 6 PM. 90 tablet 3  . azelastine (ASTELIN) 0.1 % nasal spray Place 2 sprays into both nostrils at bedtime as needed for rhinitis. Use in each nostril as directed 90 mL 3  . benzonatate (TESSALON) 200 MG capsule Take 1 capsule (200 mg total) by mouth 3 (three) times daily as needed for cough. 30 capsule 5  . Calcium Carbonate-Vitamin D 600-400 MG-UNIT per tablet Take 1 tablet by mouth daily.     Marland Kitchen dofetilide (TIKOSYN) 500 MCG capsule Take 1 capsule (500 mcg total) by mouth 2 (two) times daily. 60 capsule 6  . ELIQUIS 5 MG TABS tablet TAKE 1 TABLET BY MOUTH  TWICE A DAY 180 tablet 1  . famotidine (PEPCID) 20 MG tablet Take 1 tablet (20 mg total) by mouth  daily. At bedtime 90 tablet 3  . ferrous sulfate 325 (65 FE) MG tablet Take 325 mg by mouth 2 (two) times daily with a meal.    . FLUoxetine (PROZAC) 40 MG capsule Take 1 capsule (40 mg total) by mouth daily. 90 capsule 3  . Fluticasone-Umeclidin-Vilant (TRELEGY ELLIPTA) 100-62.5-25 MCG/INH AEPB Inhale 1 puff into the lungs daily. 60 each 11  . furosemide (LASIX) 80 MG tablet TAKE 1 TABLET BY MOUTH IN  THE MORNING AND 1 TABLET IN THE AFTERNOON 180 tablet 3  . Glucosamine-Chondroitin (COSAMIN DS PO) Take 1 tablet by mouth daily.    Marland Kitchen KLOR-CON M20 20 MEQ tablet TAKE 1 TABLET BY MOUTH  TWICE DAILY 180 tablet 3  . levalbuterol (XOPENEX) 1.25 MG/3ML nebulizer solution Take 1.25 mg by nebulization every 4 (four) hours. 360 mL 11  . levocetirizine (XYZAL) 5 MG tablet Take 1 tablet (5 mg total) by mouth at bedtime as needed for allergies. 90 tablet 3  . levothyroxine (SYNTHROID) 50 MCG tablet Take 1 tablet (50 mcg total) by mouth daily. mylan brand 90 tablet 3  . metoprolol tartrate (LOPRESSOR) 50 MG tablet Take 1 tablet (50 mg total) by mouth 2 (two) times daily. 60 tablet 6  . olopatadine (PATANOL) 0.1 % ophthalmic solution Place 1 drop into both eyes 2 (two) times daily. 15 mL 4  . OXYGEN Inhale 2.5 L/min into the lungs  continuous. And 4 with exertion    . pantoprazole (PROTONIX) 40 MG tablet Take 40 mg by mouth daily.    Marland Kitchen amLODipine (NORVASC) 5 MG tablet Take 1 tablet (5 mg total) by mouth daily. 180 tablet 3   No current facility-administered medications for this visit.     Past Medical History:  Diagnosis Date  . Anemia    hx  . Anxiety   . Arthritis   . Asthma   . Atrial fibrillation and flutter ([place])   . Atypical lobular hyperplasia of right breast 05/09/2015  . Breast CA ([place])    ?  Marland Kitchen Chronic rhinitis   . COPD (chronic obstructive pulmonary disease) ([place])    [facility]. PFTs 07/25/06 FEV1 55% ratio 53, DLC0 51% HFA 50% 12/16/2009  . Depression   . FRACTURE, RIB, RIGHT 06/18/2009  . Glucose  intolerance (impaired glucose tolerance)    on steroids  . History of DVT (deep vein thrombosis)   . Hx of cardiac catheterization    LHC (01/2001):  Normal Cors.  EF 65%.;  LexiScan Myoview (03/2013): No ischemia, EF 61%, normal  . Hx of echocardiogram    Echo (11/2011):  Mod LVH, EF 60-65%, no RWMA, MAC, mild LAE, PASP 34.  Marland Kitchen HYPERLIPIDEMIA   . Hypertension   . Hypothyroidism   . Morbid obesity ([place])    target wt=179lb for BMI<30 Peak wt 282lb  . Osteoporosis    last BMD 4/08 -2.4, intolerant of bisphosphonates  . Peripheral vascular disease (HCC)    hx dvt.pe  . Pneumonia    hx  . PONV (postoperative nausea and vomiting)   . PREMATURE VENTRICULAR CONTRACTIONS   . PULMONARY EMBOLISM, HX OF 06/19/2007  . Recurrent upper respiratory infection (URI)   . Rhabdomyolysis 07/29/2009  . Tremor   . VITAMIN D DEFICIENCY     ROS:   All systems reviewed and negative except as noted in the HPI.   Past Surgical History:  Procedure Laterality Date  . A-FLUTTER ABLATION N/A 10/08/2016   Procedure: A-FLUTTER ABLATION;  Surgeon: Evans Lance, MD;  Location: [place] [facility];  Service: Cardiovascular;  Laterality: N/A;  . ABDOMINAL HYSTERECTOMY    . BREAST LUMPECTOMY WITH RADIOACTIVE SEED LOCALIZATION Right 06/05/2015   Procedure: RIGHT BREAST LUMPECTOMY WITH RADIOACTIVE SEED LOCALIZATION;  Surgeon: Fanny Skates, MD;  Location: [place];  Service: General;  Laterality: Right;  . BREAST SURGERY Left    s/p mass removal  . CARDIOVERSION N/A 12/11/2014   Procedure: CARDIOVERSION;  Surgeon: Josue Hector, MD;  Location: [place];  Service: Cardiovascular;  Laterality: N/A;  . CARDIOVERSION N/A 09/15/2016   Procedure: CARDIOVERSION;  Surgeon: Jerline Pain, MD;  Location: [facility] ENDOSCOPY;  Service: Cardiovascular;  Laterality: N/A;  . CARDIOVERSION N/A 04/09/2019   Procedure: CARDIOVERSION;  Surgeon: Pixie Casino, MD;  Location: [facility] ENDOSCOPY;  Service: Cardiovascular;  Laterality: N/A;  .  CARDIOVERSION N/A 06/14/2019   Procedure: CARDIOVERSION;  Surgeon: Lelon Perla, MD;  Location: [facility] ENDOSCOPY;  Service: Cardiovascular;  Laterality: N/A;  . COLONOSCOPY  10/22/2003, ?2006  . COLONOSCOPY WITH PROPOFOL N/A 01/15/2016   Procedure: COLONOSCOPY WITH PROPOFOL;  Surgeon: Gatha Mayer, MD;  Location: [facility] ENDOSCOPY;  Service: Endoscopy;  Laterality: N/A;  . ESOPHAGOGASTRODUODENOSCOPY (EGD) WITH PROPOFOL N/A 01/15/2016   Procedure: ESOPHAGOGASTRODUODENOSCOPY (EGD) WITH PROPOFOL;  Surgeon: Gatha Mayer, MD;  Location: [facility] ENDOSCOPY;  Service: Endoscopy;  Laterality: N/A;  . s/p left arm fracture with fall off chair    .  skin graft to middle R finger  1975  . TEE WITHOUT CARDIOVERSION N/A 12/11/2014   Procedure: TRANSESOPHAGEAL ECHOCARDIOGRAM (TEE);  Surgeon: Josue Hector, MD;  Location: [facility] ENDOSCOPY;  Service: Cardiovascular;  Laterality: N/A;  . TONSILLECTOMY       Family History  Problem Relation Age of Onset  . Heart disease Mother   . Liver disease Mother        alcohol related  . Asthma Brother   . Esophageal cancer Brother   . Asthma Son   . Colon cancer Neg Hx   . Stomach cancer Neg Hx   . Pancreatic cancer Neg Hx   . Inflammatory bowel disease Neg Hx   . Allergic rhinitis Neg Hx   . Eczema Neg Hx   . Urticaria Neg Hx      Social History   Socioeconomic History  . Marital status: Married    Spouse name: Not on file  . Number of children: 3  . Years of education: Not on file  . Highest education level: Not on file  Occupational History  . 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 Smoking status: Former Smoker    Packs/day: 1.00    Years: 35.00    Pack years: 35.00    Types: Cigarettes    Quit date: 01/11/2006    Years since quitting: 13.5  . Smokeless tobacco: Never Used  Vaping Use  . Vaping Use: Never used  Substance and Sexual Activity  . Alcohol use: No    Alcohol/week: 0.0 standard drinks  .  Drug use: No  . Sexual activity: Yes  Other Topics Concern  . Not on file  Social History Narrative   Married 1 son 2 daughters   Disabled   2 caffeine/day   Past smoker   11/12/2015   Social Determinants of Health   Financial Resource Strain:   . Difficulty of Paying Living Expenses:   Food Insecurity:   . Worried About Charity fundraiser in the Last Year:   . Arboriculturist in the Last Year:   Transportation Needs:   . Film/video editor (Medical):   Marland Kitchen Lack of Transportation (Non-Medical):   Physical Activity:   . Days of Exercise per Week:   . Minutes of Exercise per Session:   Stress:   . Feeling of Stress :   Social Connections:   . Frequency of Communication with Friends and Family:   . Frequency of Social Gatherings with Friends and Family:   . Attends Religious Services:   . Active Member of Clubs or Organizations:   . Attends Archivist Meetings:   Marland Kitchen Marital Status:   Intimate Partner Violence:   . Fear of Current or Ex-Partner:   . Emotionally Abused:   Marland Kitchen Physically Abused:   . Sexually Abused:      Ht _0  (1.575 m)   Wt 244 lb (110.7 kg)   SpO2 92%   BMI 44.63 kg/m   Physical Exam:  Well appearing NAD HEENT: Unremarkable Neck:  No JVD, no thyromegally Lymphatics:  No adenopathy Back:  No CVA tenderness Lungs:  Clear with no wheezes HEART:  Regular rate rhythm, no murmurs, no rubs, no clicks Abd:  soft, positive bowel sounds, no organomegally, no rebound, no guarding Ext:  2 plus pulses, no edema, no cyanosis, no clubbing Skin:  No rashes no nodules Neuro:  CN II through XII intact, motor  grossly intact  EKG - nsr    Assess/Plan: 1. PAF - she appears to be maintaining NSR. She will continue dofetilide 2. Darrhea - she is actually having only 1-2 bm's a day. If this continues I encouraged her to reach out to her primary MD. 3. Obesity - she is encouraged to lose weight.  4. HTN - her bp is controlled. She is encouraged to  lose weight.  [email or web address].

## 2019-07-13 NOTE — Patient Instructions (Signed)
Medication Instructions:  Your physician recommends that you continue on your current medications as directed. Please refer to the Current Medication list given to you today.  *If you need a refill on your cardiac medications before your next appointment, please call your pharmacy*  Lab Work: None ordered.  If you have labs (blood work) drawn today and your tests are completely normal, you will receive your results only by: Marland Kitchen MyChart Message (if you have MyChart) OR . A paper copy in the mail If you have any lab test that is abnormal or we need to change your treatment, we will call you to review the results.  Testing/Procedures: None ordered.  Follow-Up: At [facility], you and your health needs are our priority.  As part of our continuing mission to provide you with exceptional heart care, we have created designated Provider Care Teams.  These Care Teams include your primary Cardiologist (physician) and Advanced Practice Providers (APPs -  Physician Assistants and Nurse Practitioners) who all work together to provide you with the care you need, when you need it.  We recommend signing up for the patient portal called "MyChart".  Sign up information is provided on this After Visit Summary.  MyChart is used to connect with patients for Virtual Visits (Telemedicine).  Patients are able to view lab/test results, encounter notes, upcoming appointments, etc.  Non-urgent messages can be sent to your provider as well.   To learn more about what you can do with MyChart, go to [email or web address].    Your next appointment:   Your physician wants you to follow-up in: 1 year with Dr. Lovena Le. You will receive a reminder letter in the mail two months in advance. If you don't receive a letter, please call our office to schedule the follow-up appointment.    Other Instructions:

## 2019-07-17 ENCOUNTER — Telehealth: Payer: Medicare Other | Admitting: Cardiology

## 2019-07-17 DIAGNOSIS — R309 Painful micturition, unspecified: Secondary | ICD-10-CM | POA: Diagnosis not present

## 2019-07-17 DIAGNOSIS — N390 Urinary tract infection, site not specified: Secondary | ICD-10-CM | POA: Diagnosis not present

## 2019-07-18 ENCOUNTER — Telehealth: Payer: Self-pay | Admitting: *Deleted

## 2019-07-18 ENCOUNTER — Telehealth (INDEPENDENT_AMBULATORY_CARE_PROVIDER_SITE_OTHER): Payer: Medicare Other | Admitting: Cardiology

## 2019-07-18 ENCOUNTER — Encounter: Payer: Self-pay | Admitting: Cardiology

## 2019-07-18 VITALS — BP 131/66 | HR 64 | Ht 62.0 in | Wt 245.0 lb

## 2019-07-18 DIAGNOSIS — J449 Chronic obstructive pulmonary disease, unspecified: Secondary | ICD-10-CM

## 2019-07-18 DIAGNOSIS — I4819 Other persistent atrial fibrillation: Secondary | ICD-10-CM

## 2019-07-18 DIAGNOSIS — I350 Nonrheumatic aortic (valve) stenosis: Secondary | ICD-10-CM

## 2019-07-18 DIAGNOSIS — Z86718 Personal history of other venous thrombosis and embolism: Secondary | ICD-10-CM

## 2019-07-18 DIAGNOSIS — I10 Essential (primary) hypertension: Secondary | ICD-10-CM

## 2019-07-18 NOTE — Progress Notes (Signed)
Virtual Visit via Telephone Note   This visit type was conducted due to national recommendations for restrictions regarding the COVID-19 Pandemic (e.g. social distancing) in an effort to limit this patient's exposure and mitigate transmission in our community.  Due to her co-morbid illnesses, this patient is at least at moderate risk for complications without adequate follow up.  This format is felt to be most appropriate for this patient at this time.  The patient did not have access to video technology/had technical difficulties with video requiring transitioning to audio format only (telephone).  All issues noted in this document were discussed and addressed.  No physical exam could be performed with this format.  Please refer to the patient's chart for her  consent to telehealth for [facility].   The patient was identified using 2 identifiers.  Date:  01/16/1094   ID:  [name] [name], DOB [id number], MRN [id number]  Patient Location: Home Provider Location: Home  PCP:  McLean-Scocuzza, Nino Glow, MD  Cardiologist:  Kirk Ruths, MD  Electrophysiologist:  Cristopher Peru, MD   Evaluation Performed:  Follow-Up Visit  Chief Complaint:  none  History of Present Illness:    [name] [name] is a 69 y.o. female with a history of remote normal coronaries in 2003 and low risk stress test in 2015.  She has had a prior pulmonary embolism.  She has treated hypertension, mild aortic stenosis, morbid obesity, and COPD on chronic O2.  She has had issues with paroxysmal atrial fibrillation.  In June 2021 she was admitted for Tikosyn therapy and cardioversion.  This was successful.  She just saw Dr. Lovena Le 07/16/2019 and he released her for follow-up in 1 year.  The patient was given this appointment today as a routine follow-up for Dr. Stanford Breed, apparently they were unaware she had just seen Dr. Lovena Le.  The patient continues to do well and remains in sinus rhythm as far she can tell.  I will not charge  her for this visit since she was just seen 5 days ago.  The patient does not have symptoms concerning for COVID-19 infection (fever, chills, cough, or new shortness of breath).    Past Medical History:  Diagnosis Date  . Anemia    hx  . Anxiety   . Arthritis   . Asthma   . Atrial fibrillation and flutter ([place])   . Atypical lobular hyperplasia of right breast 05/09/2015  . Breast CA ([place])    ?  Marland Kitchen Chronic rhinitis   . COPD (chronic obstructive pulmonary disease) ([place])    [facility]. PFTs 07/25/06 FEV1 55% ratio 53, DLC0 51% HFA 50% 12/16/2009  . Depression   . FRACTURE, RIB, RIGHT 06/18/2009  . Glucose intolerance (impaired glucose tolerance)    on steroids  . History of DVT (deep vein thrombosis)   . Hx of cardiac catheterization    LHC (01/2001):  Normal Cors.  EF 65%.;  LexiScan Myoview (03/2013): No ischemia, EF 61%, normal  . Hx of echocardiogram    Echo (11/2011):  Mod LVH, EF 60-65%, no RWMA, MAC, mild LAE, PASP 34.  Marland Kitchen HYPERLIPIDEMIA   . Hypertension   . Hypothyroidism   . Morbid obesity ([place])    target wt=179lb for BMI<30 Peak wt 282lb  . Osteoporosis    last BMD 4/08 -2.4, intolerant of bisphosphonates  . Peripheral vascular disease (HCC)    hx dvt.pe  . Pneumonia    hx  . PONV (postoperative nausea and vomiting)   . PREMATURE  VENTRICULAR CONTRACTIONS   . PULMONARY EMBOLISM, HX OF 06/19/2007  . Recurrent upper respiratory infection (URI)   . Rhabdomyolysis 07/29/2009  . Tremor   . VITAMIN D DEFICIENCY    Past Surgical History:  Procedure Laterality Date  . A-FLUTTER ABLATION N/A 10/08/2016   Procedure: A-FLUTTER ABLATION;  Surgeon: Evans Lance, MD;  Location: [place] [facility];  Service: Cardiovascular;  Laterality: N/A;  . ABDOMINAL HYSTERECTOMY    . BREAST LUMPECTOMY WITH RADIOACTIVE SEED LOCALIZATION Right 06/05/2015   Procedure: RIGHT BREAST LUMPECTOMY WITH RADIOACTIVE SEED LOCALIZATION;  Surgeon: Fanny Skates, MD;  Location: [place];  Service: General;  Laterality:  Right;  . BREAST SURGERY Left    s/p mass removal  . CARDIOVERSION N/A 12/11/2014   Procedure: CARDIOVERSION;  Surgeon: Josue Hector, MD;  Location: [place];  Service: Cardiovascular;  Laterality: N/A;  . CARDIOVERSION N/A 09/15/2016   Procedure: CARDIOVERSION;  Surgeon: Jerline Pain, MD;  Location: [facility] ENDOSCOPY;  Service: Cardiovascular;  Laterality: N/A;  . CARDIOVERSION N/A 04/09/2019   Procedure: CARDIOVERSION;  Surgeon: Pixie Casino, MD;  Location: [facility] ENDOSCOPY;  Service: Cardiovascular;  Laterality: N/A;  . CARDIOVERSION N/A 06/14/2019   Procedure: CARDIOVERSION;  Surgeon: Lelon Perla, MD;  Location: [facility] ENDOSCOPY;  Service: Cardiovascular;  Laterality: N/A;  . COLONOSCOPY  10/22/2003, ?2006  . COLONOSCOPY WITH PROPOFOL N/A 01/15/2016   Procedure: COLONOSCOPY WITH PROPOFOL;  Surgeon: Gatha Mayer, MD;  Location: [facility] ENDOSCOPY;  Service: Endoscopy;  Laterality: N/A;  . ESOPHAGOGASTRODUODENOSCOPY (EGD) WITH PROPOFOL N/A 01/15/2016   Procedure: ESOPHAGOGASTRODUODENOSCOPY (EGD) WITH PROPOFOL;  Surgeon: Gatha Mayer, MD;  Location: [facility] ENDOSCOPY;  Service: Endoscopy;  Laterality: N/A;  . s/p left arm fracture with fall off chair    . skin graft to middle R finger  1975  . TEE WITHOUT CARDIOVERSION N/A 12/11/2014   Procedure: TRANSESOPHAGEAL ECHOCARDIOGRAM (TEE);  Surgeon: Josue Hector, MD;  Location: [facility] ENDOSCOPY;  Service: Cardiovascular;  Laterality: N/A;  . TONSILLECTOMY       Current Meds  Medication Sig  . albuterol (VENTOLIN HFA) 108 (90 Base) MCG/ACT inhaler Inhale 2 puffs into the lungs as needed for wheezing or shortness of breath.   Marland Kitchen atorvastatin (LIPITOR) 10 MG tablet Take 1 tablet (10 mg total) by mouth daily at 6 PM.  . azelastine (ASTELIN) 0.1 % nasal spray Place 2 sprays into both nostrils at bedtime as needed for rhinitis. Use in each nostril as directed  . benzonatate (TESSALON) 200 MG capsule Take 1 capsule (200 mg total) by mouth 3 (three) times daily as  needed for cough.  . Calcium Carbonate-Vitamin D 600-400 MG-UNIT per tablet Take 1 tablet by mouth daily.   Marland Kitchen dofetilide (TIKOSYN) 500 MCG capsule Take 1 capsule (500 mcg total) by mouth 2 (two) times daily.  Marland Kitchen ELIQUIS 5 MG TABS tablet TAKE 1 TABLET BY MOUTH  TWICE A DAY  . famotidine (PEPCID) 20 MG tablet Take 1 tablet (20 mg total) by mouth daily. At bedtime  . ferrous sulfate 325 (65 FE) MG tablet Take 325 mg by mouth 2 (two) times daily with a meal.  . FLUoxetine (PROZAC) 40 MG capsule Take 1 capsule (40 mg total) by mouth daily.  . Fluticasone-Umeclidin-Vilant (TRELEGY ELLIPTA) 100-62.5-25 MCG/INH AEPB Inhale 1 puff into the lungs daily.  . furosemide (LASIX) 80 MG tablet TAKE 1 TABLET BY MOUTH IN  THE MORNING AND 1 TABLET IN THE AFTERNOON  . Glucosamine-Chondroitin (COSAMIN DS PO) Take 1 tablet  by mouth daily.  Marland Kitchen KLOR-CON M20 20 MEQ tablet TAKE 1 TABLET BY MOUTH  TWICE DAILY  . levalbuterol (XOPENEX) 1.25 MG/3ML nebulizer solution Take 1.25 mg by nebulization every 4 (four) hours.  Marland Kitchen levocetirizine (XYZAL) 5 MG tablet Take 1 tablet (5 mg total) by mouth at bedtime as needed for allergies.  Marland Kitchen levothyroxine (SYNTHROID) 50 MCG tablet Take 1 tablet (50 mcg total) by mouth daily. mylan brand  . metoprolol tartrate (LOPRESSOR) 50 MG tablet Take 1 tablet (50 mg total) by mouth 2 (two) times daily.  Marland Kitchen olopatadine (PATANOL) 0.1 % ophthalmic solution Place 1 drop into both eyes 2 (two) times daily.  . OXYGEN Inhale 2.5 L/min into the lungs continuous. And 4 with exertion  . pantoprazole (PROTONIX) 40 MG tablet Take 40 mg by mouth daily.     Allergies:   Duloxetine, Penicillins, and Latex   Social History   Tobacco Use  . Smoking status: Former Smoker    Packs/day: 1.00    Years: 35.00    Pack years: 35.00    Types: Cigarettes    Quit date: 01/11/2006    Years since quitting: 13.5  . Smokeless tobacco: Never Used  Vaping Use  . Vaping Use: Never used  Substance Use Topics  . Alcohol use:  No    Alcohol/week: 0.0 standard drinks  . Drug use: No     Family Hx: The patient's family history includes Asthma in her brother and son; Esophageal cancer in her brother; Heart disease in her mother; Liver disease in her mother. There is no history of Colon cancer, Stomach cancer, Pancreatic cancer, Inflammatory bowel disease, Allergic rhinitis, Eczema, or Urticaria.  ROS:   Please see the history of present illness.    UTI- treated by her OB GYN All other systems reviewed and are negative.   Prior CV studies:   The following studies were reviewed today:  Echo 07/17/2019- IMPRESSIONS    1. The left ventricle has normal systolic function with an ejection  fraction of 60-65%. The cavity size was normal. There is moderately  increased left ventricular wall thickness. Left ventricular diastolic  Doppler parameters are consistent with  pseudonormalization. Elevated left ventricular end-diastolic pressure The  E/e' is >20.  2. The right ventricle has normal systolic function. The cavity was  normal. There is no increase in right ventricular wall thickness. Right  ventricular systolic pressure is mildly elevated with an estimated  pressure of 32.2 mmHg.  3. Left atrial size was severely dilated.  4. The aortic valve is abnormal. Mild calcification of the aortic valve.  Aortic valve regurgitation is trivial by color flow Doppler. Mild stenosis  of the aortic valve. Mean gradient 11 mmHg. Difference in mean gradients  between exams may be secondary  to increased flow velocities across LVOT on prior study.  5. The mitral valve is abnormal. There is moderate to severe mitral  annular calcification present. Mean diastolic gradient 3 mmHg at HR 64  bpm.   Labs/Other Tests and Data Reviewed:    EKG:  An ECG dated 07/13/2019 was personally reviewed today and demonstrated:  NSR, HR 55, QTc 421  Recent Labs: 04/03/2019: TSH 2.95 06/21/2019: BUN 14; Creatinine, Ser 0.83; Hemoglobin  11.3; Magnesium 2.3; Platelets 259; Potassium 4.2; Sodium 140   Recent Lipid Panel Lab Results  Component Value Date/Time   CHOL 152 12/28/2018 11:07 AM   TRIG 102 12/28/2018 11:07 AM   HDL 40 12/28/2018 11:07 AM   CHOLHDL 3.8 12/28/2018 11:07  AM   CHOLHDL 4 03/09/2018 09:49 AM   LDLCALC 93 12/28/2018 11:07 AM   LDLDIRECT 140.2 04/10/2010 08:36 AM    Wt Readings from Last 3 Encounters:  07/18/19 245 lb (111.1 kg)  07/13/19 244 lb (110.7 kg)  06/21/19 243 lb 12.8 oz (110.6 kg)     Objective:    Vital Signs:  BP 131/66   Pulse 64   Ht _0  (1.575 m)   Wt 245 lb (111.1 kg)   BMI 44.81 kg/m    VITAL SIGNS:  reviewed  ASSESSMENT & PLAN:    PAF- Holding NSR after DCCV on Tikosyn June 2021  Mild AS- Echo July 2020- no symptoms to suggest progression.   Chronic respiratory failure- Secondary to COPD and obesity- on chronic O2 followed by [facility].   HTN- Grade 2 DD on echo.  B/P controlled  Plan: Same Rx- f/u with Dr Stanford Breed in 6 months in the office with an EKG.     COVID-19 Education: The signs and symptoms of COVID-19 were discussed with the patient and how to seek care for testing (follow up with PCP or arrange E-visit).  The importance of social distancing was discussed today.  Time:   Today, I have spent 10 minutes with the patient with telehealth technology discussing the above problems.     Medication Adjustments/Labs and Tests Ordered: Current medicines are reviewed at length with the patient today.  Concerns regarding medicines are outlined above.   Tests Ordered: No orders of the defined types were placed in this encounter.   Medication Changes: No orders of the defined types were placed in this encounter.   Follow Up:  In Person 6 months  Signed, Kerin Ransom, Hershal Coria  07/18/2019 10:00 AM    [place]

## 2019-07-18 NOTE — Telephone Encounter (Signed)
  Patient Consent for Virtual Visit         [place] PAONE has provided verbal consent on 07/18/2019 for a virtual visit (video or telephone).   CONSENT FOR VIRTUAL VISIT FOR:  [name] [name]  By participating in this virtual visit I agree to the following:  I hereby voluntarily request, consent and authorize [facility] and its employed or contracted physicians, physician assistants, nurse practitioners or other licensed health care professionals (the Practitioner), to provide me with telemedicine health care services (the "Services") as deemed necessary by the treating Practitioner. I acknowledge and consent to receive the Services by the Practitioner via telemedicine. I understand that the telemedicine visit will involve communicating with the Practitioner through live audiovisual communication technology and the disclosure of certain medical information by electronic transmission. I acknowledge that I have been given the opportunity to request an in-person assessment or other available alternative prior to the telemedicine visit and am voluntarily participating in the telemedicine visit.  I understand that I have the right to withhold or withdraw my consent to the use of telemedicine in the course of my care at any time, without affecting my right to future care or treatment, and that the Practitioner or I may terminate the telemedicine visit at any time. I understand that I have the right to inspect all information obtained and/or recorded in the course of the telemedicine visit and may receive copies of available information for a reasonable fee.  I understand that some of the potential risks of receiving the Services via telemedicine include:  Marland Kitchen Delay or interruption in medical evaluation due to technological equipment failure or disruption; . Information transmitted may not be sufficient (e.g. poor resolution of images) to allow for appropriate medical decision making by the Practitioner;  and/or  . In rare instances, security protocols could fail, causing a breach of personal health information.  Furthermore, I acknowledge that it is my responsibility to provide information about my medical history, conditions and care that is complete and accurate to the best of my ability. I acknowledge that Practitioner's advice, recommendations, and/or decision may be based on factors not within their control, such as incomplete or inaccurate data provided by me or distortions of diagnostic images or specimens that may result from electronic transmissions. I understand that the practice of medicine is not an exact science and that Practitioner makes no warranties or guarantees regarding treatment outcomes. I acknowledge that a copy of this consent can be made available to me via my patient portal ([facility]), or I can request a printed copy by calling the office of [facility].    I understand that my insurance will be billed for this visit.   I have read or had this consent read to me. . I understand the contents of this consent, which adequately explains the benefits and risks of the Services being provided via telemedicine.  . I have been provided ample opportunity to ask questions regarding this consent and the Services and have had my questions answered to my satisfaction. . I give my informed consent for the services to be provided through the use of telemedicine in my medical care

## 2019-07-18 NOTE — Patient Instructions (Signed)
Medication Instructions:  No changes *If you need a refill on your cardiac medications before your next appointment, please call your pharmacy*   Lab Work: None ordered If you have labs (blood work) drawn today and your tests are completely normal, you will receive your results only by:  [place] (if you have MyChart) OR  A paper copy in the mail If you have any lab test that is abnormal or we need to change your treatment, we will call you to review the results.   Testing/Procedures: None ordered   Follow-Up: At [facility], you and your health needs are our priority.  As part of our continuing mission to provide you with exceptional heart care, we have created designated Provider Care Teams.  These Care Teams include your primary Cardiologist (physician) and Advanced Practice Providers (APPs -  Physician Assistants and Nurse Practitioners) who all work together to provide you with the care you need, when you need it.  We recommend signing up for the patient portal called "MyChart".  Sign up information is provided on this After Visit Summary.  MyChart is used to connect with patients for Virtual Visits (Telemedicine).  Patients are able to view lab/test results, encounter notes, upcoming appointments, etc.  Non-urgent messages can be sent to your provider as well.   To learn more about what you can do with MyChart, go to [email or web address].    Your next appointment:   6 month(s)  The format for your next appointment:   In Person  Provider:   You may see Kirk Ruths, MD or one of the following Advanced Practice Providers on your designated Care Team:    Kerin Ransom, PA-C  [place], [place]  Coletta Memos, [place]

## 2019-07-29 ENCOUNTER — Other Ambulatory Visit: Payer: Self-pay | Admitting: Internal Medicine

## 2019-08-01 DIAGNOSIS — J449 Chronic obstructive pulmonary disease, unspecified: Secondary | ICD-10-CM | POA: Diagnosis not present

## 2019-08-03 ENCOUNTER — Telehealth: Payer: Self-pay | Admitting: Cardiology

## 2019-08-03 MED ORDER — APIXABAN 5 MG PO TABS: 5.0000 mg | ORAL_TABLET | Freq: Two times a day (BID) | ORAL | 1 refills | Status: AC

## 2019-08-03 NOTE — Telephone Encounter (Signed)
Prescription refill request for Eliquis received. Indication: Atrial Fibrillation Last office visit: 07/13/2019 Dr Lovena Le Scr: 06/21/2019  0.83 Age: 69 Weight: 111.1 kg  Prescription refilled

## 2019-08-03 NOTE — Telephone Encounter (Signed)
°*  STAT* If patient is at the pharmacy, call can be transferred to refill team.   1. Which medications need to be refilled? (please list name of each medication and dose if known)?   ELIQUIS 5 MG TABS tablet   2. Which pharmacy/location (including street and city if local pharmacy) is medication to be sent to? [place], [place] [employer], [address]  3. Do they need a 30 day or 90 day supply? 90 day

## 2019-08-20 ENCOUNTER — Ambulatory Visit: Payer: Medicare Other | Admitting: Cardiology

## 2019-08-24 ENCOUNTER — Telehealth: Payer: Self-pay | Admitting: Pulmonary Disease

## 2019-08-24 MED ORDER — BENZONATATE 200 MG PO CAPS
200.0000 mg | ORAL_CAPSULE | Freq: Three times a day (TID) | ORAL | 1 refills | Status: DC | PRN
Start: 2019-08-24 — End: 2019-10-22

## 2019-08-24 NOTE — Telephone Encounter (Signed)
Yes that is fine

## 2019-08-24 NOTE — Telephone Encounter (Signed)
Patient called, prescription for tessalon sent to preferred pharmacy.

## 2019-08-24 NOTE — Telephone Encounter (Signed)
Patient of Dr. Valeta Harms, requesting we take over prescription for Tessalon. She is having a cough and the original provider is no longer at the practice. Please advise if we can send.

## 2019-08-29 ENCOUNTER — Telehealth: Payer: Self-pay

## 2019-08-29 NOTE — Telephone Encounter (Signed)
Received referral for lung CT screening program from [facility].  Patient was contacted in June 2021 and asked [place] to cal back later. I spoke to patient today and she is not interested in participating in the program.  I thanked patient for her time.

## 2019-09-01 DIAGNOSIS — J449 Chronic obstructive pulmonary disease, unspecified: Secondary | ICD-10-CM | POA: Diagnosis not present

## 2019-09-20 DIAGNOSIS — Z1231 Encounter for screening mammogram for malignant neoplasm of breast: Secondary | ICD-10-CM | POA: Diagnosis not present

## 2019-09-20 DIAGNOSIS — N816 Rectocele: Secondary | ICD-10-CM | POA: Diagnosis not present

## 2019-09-24 ENCOUNTER — Other Ambulatory Visit (HOSPITAL_COMMUNITY): Payer: Self-pay | Admitting: *Deleted

## 2019-09-24 ENCOUNTER — Ambulatory Visit: Payer: Medicare Other | Attending: Internal Medicine

## 2019-09-24 DIAGNOSIS — I4891 Unspecified atrial fibrillation: Secondary | ICD-10-CM

## 2019-09-24 DIAGNOSIS — Z23 Encounter for immunization: Secondary | ICD-10-CM

## 2019-09-24 MED ORDER — METOPROLOL TARTRATE 50 MG PO TABS: 50.0000 mg | ORAL_TABLET | Freq: Two times a day (BID) | ORAL | 6 refills | Status: DC

## 2019-09-24 NOTE — Progress Notes (Signed)
   Covid-19 Vaccination Clinic  Name:  QUINITA KOSTELECKY    MRN: [id number] DOB: 12-Mar-1950  09/24/2019  Ms. Cinco was observed post Covid-19 immunization for 15 minutes without incident. She was provided with Vaccine Information Sheet and instruction to access the V-Safe system.   Ms. Ramsaran was instructed to call 911 with any severe reactions post vaccine: Marland Kitchen Difficulty breathing  . Swelling of face and throat  . A fast heartbeat  . A bad rash all over body  . Dizziness and weakness

## 2019-10-02 DIAGNOSIS — J449 Chronic obstructive pulmonary disease, unspecified: Secondary | ICD-10-CM | POA: Diagnosis not present

## 2019-10-11 ENCOUNTER — Other Ambulatory Visit: Payer: Self-pay | Admitting: *Deleted

## 2019-10-11 MED ORDER — TRELEGY ELLIPTA 100-62.5-25 MCG/INH IN AEPB: 1.0000 | INHALATION_SPRAY | Freq: Every day | RESPIRATORY_TRACT | 11 refills | Status: DC

## 2019-10-12 ENCOUNTER — Telehealth: Payer: Self-pay | Admitting: Pulmonary Disease

## 2019-10-12 MED ORDER — TRELEGY ELLIPTA 100-62.5-25 MCG/INH IN AEPB: 1.0000 | INHALATION_SPRAY | Freq: Every day | RESPIRATORY_TRACT | 11 refills | Status: AC

## 2019-10-12 NOTE — Telephone Encounter (Signed)
ATC patient to let her know that refill request has been sent to [facility] per DPR left detailed message. Nothing further needed at this time.

## 2019-10-22 ENCOUNTER — Other Ambulatory Visit: Payer: Self-pay

## 2019-10-22 ENCOUNTER — Telehealth (INDEPENDENT_AMBULATORY_CARE_PROVIDER_SITE_OTHER): Payer: Medicare Other | Admitting: Nurse Practitioner

## 2019-10-22 ENCOUNTER — Encounter: Payer: Self-pay | Admitting: Nurse Practitioner

## 2019-10-22 ENCOUNTER — Telehealth: Payer: Self-pay

## 2019-10-22 VITALS — Temp 96.6°F | Ht 62.0 in | Wt 235.0 lb

## 2019-10-22 DIAGNOSIS — R112 Nausea with vomiting, unspecified: Secondary | ICD-10-CM | POA: Diagnosis not present

## 2019-10-22 DIAGNOSIS — R197 Diarrhea, unspecified: Secondary | ICD-10-CM

## 2019-10-22 DIAGNOSIS — R531 Weakness: Secondary | ICD-10-CM | POA: Diagnosis not present

## 2019-10-22 DIAGNOSIS — R110 Nausea: Secondary | ICD-10-CM | POA: Diagnosis not present

## 2019-10-22 DIAGNOSIS — R1110 Vomiting, unspecified: Secondary | ICD-10-CM | POA: Insufficient documentation

## 2019-10-22 DIAGNOSIS — Z87898 Personal history of other specified conditions: Secondary | ICD-10-CM | POA: Diagnosis not present

## 2019-10-22 DIAGNOSIS — Z03818 Encounter for observation for suspected exposure to other biological agents ruled out: Secondary | ICD-10-CM | POA: Diagnosis not present

## 2019-10-22 NOTE — Progress Notes (Signed)
Virtual Visit via telephone Note  This visit type was conducted due to national recommendations for restrictions regarding the COVID-19 pandemic (e.g. social distancing).  This format is felt to be most appropriate for this patient at this time.  All issues noted in this document were discussed and addressed.  No physical exam was performed (except for noted visual exam findings with Video Visits).   I connected with@ on 10/22/19 at  1:00 PM EDT by a video enabled telemedicine application or telephone and verified that I am speaking with the correct person using two identifiers. Location patient: home Location provider: work or home office Persons participating in the virtual visit: patient, provider  I discussed the limitations, risks, security and privacy concerns of performing an evaluation and management service by telephone and the availability of in person appointments. I also discussed with the patient that there may be a patient responsible charge related to this service. The patient expressed understanding and agreed to proceed.  Interactive audio and video telecommunications were attempted between this provider and patient, however failed, due to patient did not have access to video capability.  We continued and completed visit with audio only.  Reason for visit: Patient had a stomach bug.  For 2 weeks she has had diarrhea and vomiting and has not been able to eat much at all.  Feeling weak.  Took Pepto-Bismol for 1 day but had black stools so she stopped.  HPI: This is a 69 year old with very complex medical history with extensive cardiac history, COPD on chronic oxygen therapy reports that she developed onset of umbilical area stomach pain quite severe 2 weeks ago associated with daily nausea, vomiting, and diarrhea.  She has lost 6 pounds in this timeframe. These sx lasted until last eve. She has no abdominal pain today, feels dehydrated, weak, but thinks she is having some improvement  today.  Her last vomiting was yesterday. She did eat a tuna sandwich today. She had one loose bowel movement today but she does not trust her bowels.  She showered today for the first time.  Her blood pressure has been low.  She cannot recall what it is.  She has had no fevers or chills.  She has no cough, sputum production, congestion, sore throat.  She has chronic shortness of breath with exertion.  Patient reports she has had 3 Covid vaccines.  She called in with his history yesterday and was directed to the emergency department.  The patient states she does not want to go to the emergency department.   ROS: See pertinent positives and negatives per HPI.  Past Medical History:  Diagnosis Date  . Anemia    hx  . Anxiety   . Arthritis   . Asthma   . Atrial fibrillation and flutter ([place])   . Atypical lobular hyperplasia of right breast 05/09/2015  . Breast CA ([place])    ?  Marland Kitchen Chronic rhinitis   . COPD (chronic obstructive pulmonary disease) ([place])    [place]. PFTs 07/25/06 FEV1 55% ratio 53, DLC0 51% HFA 50% 12/16/2009  . Depression   . FRACTURE, RIB, RIGHT 06/18/2009  . Glucose intolerance (impaired glucose tolerance)    on steroids  . History of DVT (deep vein thrombosis)   . Hx of cardiac catheterization    LHC (01/2001):  Normal Cors.  EF 65%.;  LexiScan Myoview (03/2013): No ischemia, EF 61%, normal  . Hx of echocardiogram    Echo (11/2011):  Mod LVH, EF 60-65%, no RWMA, MAC,  mild LAE, PASP 34.  Marland Kitchen HYPERLIPIDEMIA   . Hypertension   . Hypothyroidism   . Morbid obesity ([place])    target wt=179lb for BMI<30 Peak wt 282lb  . Osteoporosis    last BMD 4/08 -2.4, intolerant of bisphosphonates  . Peripheral vascular disease (HCC)    hx dvt.pe  . Pneumonia    hx  . PONV (postoperative nausea and vomiting)   . PREMATURE VENTRICULAR CONTRACTIONS   . PULMONARY EMBOLISM, HX OF 06/19/2007  . Recurrent upper respiratory infection (URI)   . Rhabdomyolysis 07/29/2009  . Tremor   . VITAMIN D DEFICIENCY      Past Surgical History:  Procedure Laterality Date  . A-FLUTTER ABLATION N/A 10/08/2016   Procedure: A-FLUTTER ABLATION;  Surgeon: Evans Lance, MD;  Location: [facility] CV LAB;  Service: Cardiovascular;  Laterality: N/A;  . ABDOMINAL HYSTERECTOMY    . BREAST LUMPECTOMY WITH RADIOACTIVE SEED LOCALIZATION Right 06/05/2015   Procedure: RIGHT BREAST LUMPECTOMY WITH RADIOACTIVE SEED LOCALIZATION;  Surgeon: Fanny Skates, MD;  Location: [facility];  Service: General;  Laterality: Right;  . BREAST SURGERY Left    s/p mass removal  . CARDIOVERSION N/A 12/11/2014   Procedure: CARDIOVERSION;  Surgeon: Josue Hector, MD;  Location: [facility];  Service: Cardiovascular;  Laterality: N/A;  . CARDIOVERSION N/A 09/15/2016   Procedure: CARDIOVERSION;  Surgeon: Jerline Pain, MD;  Location[facility] ENDOSCOPY;  Service: Cardiovascular;  Laterality: N/A;  . CARDIOVERSION N/A 04/09/2019   Procedure: CARDIOVERSION;  Surgeon: Pixie Casino, MD;  Location: [facility] ENDOSCOPY;  Service: Cardiovascular;  Laterality: N/A;  . CARDIOVERSION N/A 06/14/2019   Procedure: CARDIOVERSION;  Surgeon: Lelon Perla, MD;  Location: [facility] ENDOSCOPY;  Service: Cardiovascular;  Laterality: N/A;  . COLONOSCOPY  10/22/2003, ?2006  . COLONOSCOPY WITH PROPOFOL N/A 01/15/2016   Procedure: COLONOSCOPY WITH PROPOFOL;  Surgeon: Gatha Mayer, MD;  Location: [facility] ENDOSCOPY;  Service: Endoscopy;  Laterality: N/A;  . ESOPHAGOGASTRODUODENOSCOPY (EGD) WITH PROPOFOL N/A 01/15/2016   Procedure: ESOPHAGOGASTRODUODENOSCOPY (EGD) WITH PROPOFOL;  Surgeon: Gatha Mayer, MD;  Location: [facility] ENDOSCOPY;  Service: Endoscopy;  Laterality: N/A;  . s/p left arm fracture with fall off chair    . skin graft to middle R finger  1975  . TEE WITHOUT CARDIOVERSION N/A 12/11/2014   Procedure: TRANSESOPHAGEAL ECHOCARDIOGRAM (TEE);  Surgeon: Josue Hector, MD;  Location: [facility] ENDOSCOPY;  Service: Cardiovascular;  Laterality: N/A;  . TONSILLECTOMY      Family History   Problem Relation Age of Onset  . Heart disease Mother   . Liver disease Mother        alcohol related  . Asthma Brother   . Esophageal cancer Brother   . Asthma Son   . Colon cancer Neg Hx   . Stomach cancer Neg Hx   . Pancreatic cancer Neg Hx   . Inflammatory bowel disease Neg Hx   . Allergic rhinitis Neg Hx   . Eczema Neg Hx   . Urticaria Neg Hx     SOCIAL HX: Former smoker   Current Outpatient Medications:  .  albuterol (VENTOLIN HFA) 108 (90 Base) MCG/ACT inhaler, Inhale 2 puffs into the lungs as needed for wheezing or shortness of breath. , Disp: , Rfl:  .  amLODipine (NORVASC) 5 MG tablet, amlodipine 5 mg tablet, Disp: , Rfl:  .  apixaban (ELIQUIS) 5 MG TABS tablet, Take 1 tablet (5 mg total) by mouth 2 (two) times daily., Disp: 180 tablet, Rfl: 1 .  atorvastatin (  LIPITOR) 10 MG tablet, Take 1 tablet (10 mg total) by mouth daily at 6 PM., Disp: 90 tablet, Rfl: 3 .  azelastine (ASTELIN) 0.1 % nasal spray, Place 2 sprays into both nostrils at bedtime as needed for rhinitis. Use in each nostril as directed, Disp: 90 mL, Rfl: 3 .  Calcium Carbonate-Vitamin D 600-400 MG-UNIT per tablet, Take 1 tablet by mouth daily. , Disp: , Rfl:  .  dofetilide (TIKOSYN) 500 MCG capsule, Take 1 capsule (500 mcg total) by mouth 2 (two) times daily., Disp: 60 capsule, Rfl: 6 .  famotidine (PEPCID) 20 MG tablet, Take 1 tablet (20 mg total) by mouth daily. At bedtime, Disp: 90 tablet, Rfl: 3 .  ferrous sulfate 325 (65 FE) MG tablet, Take 325 mg by mouth 2 (two) times daily with a meal., Disp: , Rfl:  .  FLUoxetine (PROZAC) 40 MG capsule, Take 1 capsule (40 mg total) by mouth daily., Disp: 90 capsule, Rfl: 3 .  Fluticasone-Umeclidin-Vilant (TRELEGY ELLIPTA) 100-62.5-25 MCG/INH AEPB, Inhale 1 puff into the lungs daily., Disp: 60 each, Rfl: 11 .  furosemide (LASIX) 80 MG tablet, TAKE 1 TABLET BY MOUTH IN  THE MORNING AND 1 TABLET IN THE AFTERNOON, Disp: 180 tablet, Rfl: 3 .  Glucosamine-Chondroitin  (COSAMIN DS PO), Take 1 tablet by mouth daily., Disp: , Rfl:  .  KLOR-CON M20 20 MEQ tablet, TAKE 1 TABLET BY MOUTH  TWICE DAILY, Disp: 180 tablet, Rfl: 3 .  levalbuterol (XOPENEX) 1.25 MG/3ML nebulizer solution, Take 1.25 mg by nebulization every 4 (four) hours., Disp: 360 mL, Rfl: 11 .  levocetirizine (XYZAL) 5 MG tablet, Take 1 tablet (5 mg total) by mouth at bedtime as needed for allergies., Disp: 90 tablet, Rfl: 3 .  metoprolol tartrate (LOPRESSOR) 25 MG tablet, Take 25 mg by mouth., Disp: , Rfl:  .  olopatadine (PATANOL) 0.1 % ophthalmic solution, Place 1 drop into both eyes 2 (two) times daily., Disp: 15 mL, Rfl: 4 .  OXYGEN, Inhale 2.5 L/min into the lungs continuous. And 4 with exertion, Disp: , Rfl:  .  pantoprazole (PROTONIX) 40 MG tablet, TAKE 1 TABLET BY MOUTH  DAILY, 30 TO 60 MINUTES  BEFORE FIRST MEAL OF THE  DAY, Disp: 90 tablet, Rfl: 3  EXAM:  VITALS per patient if applicable: Wt 235 lbs, 96.6  GENERAL: Sounds alert, oriented on telephone   LUNGS: No signs of respiratory distress, no wheezing heard with talking but breathiness with speaking when taking and removing a quilt form the dryer. Reports this is baseline.   PSYCH/NEURO: pleasant and cooperative,  speech and thought processing grossly intact  ASSESSMENT AND PLAN:  Discussed the following assessment and plan:  Non-intractable vomiting with nausea, unspecified vomiting type  Diarrhea, unspecified type  Weakness  History of abdominal pain  No problem-specific Assessment & Plan notes found for this encounter.    I discussed the assessment and treatment plan with the patient. The patient was provided an opportunity to ask questions and all were answered. The patient agreed with the plan and demonstrated an understanding of the instructions.   The patient was advised to call back or seek an in-person evaluation if the symptoms worsen or if the condition fails to improve as anticipated.  I provided 12 minutes  of non-face-to-face time during this encounter. Denice Paradise, NP Adult Nurse Practitioner [place] [phone]

## 2019-10-22 NOTE — Telephone Encounter (Signed)
Patient was instructed to go to ED on 10-20-19 by access nurse.   [facility] [place] RECORD AccessNurse Patient Name: [name] [name] Gender: Female DOB: 1950-10-23 Age: 69 Y 64 M 17 D Return Phone Number: [phone] (Primary) Address: City/State/Zip: [place] [address] Client [facility] [place] [facility] Night - Cl Client Site [place] - Night Physician [place]lean-Scocuzza, Olivia Mackie Contact Type Call Who Is Calling Patient / Member / Family / Caregiver Call Type Triage / Clinical Relationship To Patient Self Return Phone Number [phone] (Primary) Chief Complaint Weakness, Generalized Reason for Call Symptomatic / Request for [name] states she has vomiting diarrhea and weakness. Translation No Nurse Assessment Nurse: Fredderick Phenix, RN, [name] Date/Time (Eastern Time): 10/20/2019 9:04:19 AM Confirm and document reason for call. If symptomatic, describe symptoms. ---Caller states that for one week, she has had occasional vomiting, constant diarrhea, abdominal pain in middle quadrant. Denies fever. Drinking water but still feels dehydrated. Has had three covid vaccinations. Taking pepto bismol, which is making stool black. Does the patient have any new or worsening symptoms? ---Yes Will a triage be completed? ---Yes Related visit to physician within the last 2 weeks? ---No Does the PT have any chronic conditions? (i.e. diabetes, asthma, this includes High risk factors for pregnancy, etc.) ---Yes List chronic conditions. ---COPD, on 2 L O2. Is this a behavioral health or substance abuse call? ---No Guidelines Guideline Title Affirmed Question Affirmed Notes Nurse Date/Time (Eastern Time) Diarrhea [1] Drinking very little AND [2] dehydration suspected (e.g., no urine > 12 hours, very dry mouth, very lightheaded) Fredderick Phenix, RN, [name] 10/20/2019 9:09:42 AM Disp. Time  [place] Ghazi Time) Disposition Final User 10/20/2019 9:15:28 AM Go to ED Now (or PCP triage) Yes Fredderick Phenix, RN, Karie Chimera NOTE: All timestamps contained within this report are represented as [place] Standard Time. CONFIDENTIALTY NOTICE: This fax transmission is intended only for the addressee. It contains information that is legally privileged, confidential or otherwise protected from use or disclosure. If you are not the intended recipient, you are strictly prohibited from reviewing, disclosing, copying using or disseminating any of this information or taking any action in reliance on or regarding this information. If you have received this fax in error, please notify us immediately by telephone so that we can arrange for its return to [place]. Phone: [phone], Toll-Free: [phone], Fax: [phone] Page: 2 of 2 Call Id: [id number] [place] Disagree/Comply Comply Caller Understands Yes PreDisposition Did not know what to do Care Advice Given Per Guideline GO TO ED NOW (OR PCP TRIAGE): * IF NO PCP (PRIMARY CARE PROVIDER) SECOND-LEVEL TRIAGE: You need to be seen within the next hour. Go to the [facility] at _____________ [place] as soon as you can. ANOTHER ADULT SHOULD DRIVE: * It is better and safer if another adult drives instead of you. CARE ADVICE given per Diarrhea (Adult) guideline. Referrals [facility] - ED

## 2019-10-22 NOTE — Telephone Encounter (Signed)
Agree with ED  

## 2019-10-22 NOTE — Patient Instructions (Addendum)
Advised to go to [facility] for in person exam and lab work. You are feeling, but still weak.    Nausea and Vomiting, Adult Nausea is feeling sick to your stomach or feeling that you are about to throw up (vomit). Vomiting is when food in your stomach is thrown up and out of the mouth. Throwing up can make you feel weak. It can also make you lose too much water in your body (get dehydrated). If you lose too much water in your body, you may:  Feel tired.  Feel thirsty.  Have a dry mouth.  Have cracked lips.  Go pee (urinate) less often. Older adults and people with other diseases or a weak body defense system (immune system) are at higher risk for losing too much water in the body. If you feel sick to your stomach and you throw up, it is important to follow instructions from your doctor about how to take care of yourself. Follow these instructions at home: Watch your symptoms for any changes. Tell your doctor about them. Follow these instructions to care for yourself at home. Eating and drinking      Take an ORS (oral rehydration solution). This is a drink that is sold at pharmacies and stores.  Drink clear fluids in small amounts as you are able, such as: ? Water. ? Ice chips. ? Fruit juice that has water added (diluted fruit juice). ? Low-calorie sports drinks.  Eat bland, easy-to-digest foods in small amounts as you are able, such as: ? Bananas. ? Applesauce. ? Rice. ? Low-fat (lean) meats. ? Toast. ? Crackers.  Avoid drinking fluids that have a lot of sugar or caffeine in them. This includes energy drinks, sports drinks, and soda.  Avoid alcohol.  Avoid spicy or fatty foods. General instructions  Take over-the-counter and prescription medicines only as told by your doctor.  Drink enough fluid to keep your pee (urine) pale yellow.  Wash your hands often with soap and water. If you cannot use soap and water, use hand sanitizer.  Make sure that  all people in your home wash their hands well and often.  Rest at home while you get better.  Watch your condition for any changes.  Take slow and deep breaths when you feel sick to your stomach.  Keep all follow-up visits as told by your doctor. This is important. Contact a doctor if:  Your symptoms get worse.  You have new symptoms.  You have a fever.  You cannot drink fluids without throwing up.  You feel sick to your stomach for more than 2 days.  You feel light-headed or dizzy.  You have a headache.  You have muscle cramps.  You have a rash.  You have pain while peeing. Get help right away if:  You have pain in your chest, neck, arm, or jaw.  You feel very weak or you pass out (faint).  You throw up again and again.  You have throw up that is bright red or looks like black coffee grounds.  You have bloody or black poop (stools) or poop that looks like tar.  You have a very bad headache, a stiff neck, or both.  You have very bad pain, cramping, or bloating in your belly (abdomen).  You have trouble breathing.  You are breathing very quickly.  Your heart is beating very quickly.  Your skin feels cold and clammy.  You feel confused.  You have signs of losing too much  water in your body, such as: ? Dark pee, very little pee, or no pee. ? Cracked lips. ? Dry mouth. ? Sunken eyes. ? Sleepiness. ? Weakness. These symptoms may be an emergency. Do not wait to see if the symptoms will go away. Get medical help right away. Call your local emergency services (911 in the [place] Do not drive yourself to the hospital. Summary  Nausea is feeling sick to your stomach or feeling that you are about to throw up (vomit). Vomiting is when food in your stomach is thrown up and out of the mouth.  Follow instructions from your doctor about eating and drinking to keep from losing too much water in your body.  Take over-the-counter and prescription medicines only as  told by your doctor.  Contact your doctor if your symptoms get worse or you have new symptoms.  Keep all follow-up visits as told by your doctor. This is important. This information is not intended to replace advice given to you by your health care provider. Make sure you discuss any questions you have with your health care provider. Document Revised: 04/21/2018 Document Reviewed: 06/07/2017 Elsevier Patient Education  [place].  Nausea and Vomiting, Adult Nausea is feeling sick to your stomach or feeling that you are about to throw up (vomit). Vomiting is when food in your stomach is thrown up and out of the mouth. Throwing up can make you feel weak. It can also make you lose too much water in your body (get dehydrated). If you lose too much water in your body, you may:  Feel tired.  Feel thirsty.  Have a dry mouth.  Have cracked lips.  Go pee (urinate) less often. Older adults and people with other diseases or a weak body defense system (immune system) are at higher risk for losing too much water in the body. If you feel sick to your stomach and you throw up, it is important to follow instructions from your doctor about how to take care of yourself. Follow these instructions at home: Watch your symptoms for any changes. Tell your doctor about them. Follow these instructions to care for yourself at home. Eating and drinking      Take an ORS (oral rehydration solution). This is a drink that is sold at pharmacies and stores.  Drink clear fluids in small amounts as you are able, such as: ? Water. ? Ice chips. ? Fruit juice that has water added (diluted fruit juice). ? Low-calorie sports drinks.  Eat bland, easy-to-digest foods in small amounts as you are able, such as: ? Bananas. ? Applesauce. ? Rice. ? Low-fat (lean) meats. ? Toast. ? Crackers.  Avoid drinking fluids that have a lot of sugar or caffeine in them. This includes energy drinks, sports drinks, and  soda.  Avoid alcohol.  Avoid spicy or fatty foods. General instructions  Take over-the-counter and prescription medicines only as told by your doctor.  Drink enough fluid to keep your pee (urine) pale yellow.  Wash your hands often with soap and water. If you cannot use soap and water, use hand sanitizer.  Make sure that all people in your home wash their hands well and often.  Rest at home while you get better.  Watch your condition for any changes.  Take slow and deep breaths when you feel sick to your stomach.  Keep all follow-up visits as told by your doctor. This is important. Contact a doctor if:  Your symptoms get worse.  You have  new symptoms.  You have a fever.  You cannot drink fluids without throwing up.  You feel sick to your stomach for more than 2 days.  You feel light-headed or dizzy.  You have a headache.  You have muscle cramps.  You have a rash.  You have pain while peeing. Get help right away if:  You have pain in your chest, neck, arm, or jaw.  You feel very weak or you pass out (faint).  You throw up again and again.  You have throw up that is bright red or looks like black coffee grounds.  You have bloody or black poop (stools) or poop that looks like tar.  You have a very bad headache, a stiff neck, or both.  You have very bad pain, cramping, or bloating in your belly (abdomen).  You have trouble breathing.  You are breathing very quickly.  Your heart is beating very quickly.  Your skin feels cold and clammy.  You feel confused.  You have signs of losing too much water in your body, such as: ? Dark pee, very little pee, or no pee. ? Cracked lips. ? Dry mouth. ? Sunken eyes. ? Sleepiness. ? Weakness. These symptoms may be an emergency. Do not wait to see if the symptoms will go away. Get medical help right away. Call your local emergency services (911 in the [place] Do not drive yourself to the  hospital. Summary  Nausea is feeling sick to your stomach or feeling that you are about to throw up (vomit). Vomiting is when food in your stomach is thrown up and out of the mouth.  Follow instructions from your doctor about eating and drinking to keep from losing too much water in your body.  Take over-the-counter and prescription medicines only as told by your doctor.  Contact your doctor if your symptoms get worse or you have new symptoms.  Keep all follow-up visits as told by your doctor. This is important. This information is not intended to replace advice given to you by your health care provider. Make sure you discuss any questions you have with your health care provider. Document Revised: 04/21/2018 Document Reviewed: 06/07/2017 Elsevier Patient Education  [place].  Nausea and Vomiting, Adult Nausea is feeling sick to your stomach or feeling that you are about to throw up (vomit). Vomiting is when food in your stomach is thrown up and out of the mouth. Throwing up can make you feel weak. It can also make you lose too much water in your body (get dehydrated). If you lose too much water in your body, you may:  Feel tired.  Feel thirsty.  Have a dry mouth.  Have cracked lips.  Go pee (urinate) less often. Older adults and people with other diseases or a weak body defense system (immune system) are at higher risk for losing too much water in the body. If you feel sick to your stomach and you throw up, it is important to follow instructions from your doctor about how to take care of yourself. Follow these instructions at home: Watch your symptoms for any changes. Tell your doctor about them. Follow these instructions to care for yourself at home. Eating and drinking      Take an ORS (oral rehydration solution). This is a drink that is sold at pharmacies and stores.  Drink clear fluids in small amounts as you are able, such as: ? Water. ? Ice chips. ? Fruit  juice that has water added (  diluted fruit juice). ? Low-calorie sports drinks.  Eat bland, easy-to-digest foods in small amounts as you are able, such as: ? Bananas. ? Applesauce. ? Rice. ? Low-fat (lean) meats. ? Toast. ? Crackers.  Avoid drinking fluids that have a lot of sugar or caffeine in them. This includes energy drinks, sports drinks, and soda.  Avoid alcohol.  Avoid spicy or fatty foods. General instructions  Take over-the-counter and prescription medicines only as told by your doctor.  Drink enough fluid to keep your pee (urine) pale yellow.  Wash your hands often with soap and water. If you cannot use soap and water, use hand sanitizer.  Make sure that all people in your home wash their hands well and often.  Rest at home while you get better.  Watch your condition for any changes.  Take slow and deep breaths when you feel sick to your stomach.  Keep all follow-up visits as told by your doctor. This is important. Contact a doctor if:  Your symptoms get worse.  You have new symptoms.  You have a fever.  You cannot drink fluids without throwing up.  You feel sick to your stomach for more than 2 days.  You feel light-headed or dizzy.  You have a headache.  You have muscle cramps.  You have a rash.  You have pain while peeing. Get help right away if:  You have pain in your chest, neck, arm, or jaw.  You feel very weak or you pass out (faint).  You throw up again and again.  You have throw up that is bright red or looks like black coffee grounds.  You have bloody or black poop (stools) or poop that looks like tar.  You have a very bad headache, a stiff neck, or both.  You have very bad pain, cramping, or bloating in your belly (abdomen).  You have trouble breathing.  You are breathing very quickly.  Your heart is beating very quickly.  Your skin feels cold and clammy.  You feel confused.  You have signs of losing too much water in  your body, such as: ? Dark pee, very little pee, or no pee. ? Cracked lips. ? Dry mouth. ? Sunken eyes. ? Sleepiness. ? Weakness. These symptoms may be an emergency. Do not wait to see if the symptoms will go away. Get medical help right away. Call your local emergency services (911 in the [place] Do not drive yourself to the hospital. Summary  Nausea is feeling sick to your stomach or feeling that you are about to throw up (vomit). Vomiting is when food in your stomach is thrown up and out of the mouth.  Follow instructions from your doctor about eating and drinking to keep from losing too much water in your body.  Take over-the-counter and prescription medicines only as told by your doctor.  Contact your doctor if your symptoms get worse or you have new symptoms.  Keep all follow-up visits as told by your doctor. This is important. This information is not intended to replace advice given to you by your health care provider. Make sure you discuss any questions you have with your health care provider. Document Revised: 04/21/2018 Document Reviewed: 06/07/2017 Elsevier Patient Education  [place].  Diarrhea, Adult Diarrhea is frequent loose and watery bowel movements. Diarrhea can make you feel weak and cause you to become dehydrated. Dehydration can make you tired and thirsty, cause you to have a dry mouth, and decrease how often you  urinate. Diarrhea typically lasts 2-3 days. However, it can last longer if it is a sign of something more serious. It is important to treat your diarrhea as told by your health care provider. Follow these instructions at home: Eating and drinking     Follow these recommendations as told by your health care provider:  Take an oral rehydration solution (ORS). This is an over-the-counter medicine that helps return your body to its normal balance of nutrients and water. It is found at pharmacies and retail stores.  Drink plenty of fluids, such as  water, ice chips, diluted fruit juice, and low-calorie sports drinks. You can drink milk also, if desired.  Avoid drinking fluids that contain a lot of sugar or caffeine, such as energy drinks, sports drinks, and soda.  Eat bland, easy-to-digest foods in small amounts as you are able. These foods include bananas, applesauce, rice, lean meats, toast, and crackers.  Avoid alcohol.  Avoid spicy or fatty foods.  Medicines  Take over-the-counter and prescription medicines only as told by your health care provider.  If you were prescribed an antibiotic medicine, take it as told by your health care provider. Do not stop using the antibiotic even if you start to feel better. General instructions   Wash your hands often using soap and water. If soap and water are not available, use a hand sanitizer. Others in the household should wash their hands as well. Hands should be washed: ? After using the toilet or changing a diaper. ? Before preparing, cooking, or serving food. ? While caring for a sick person or while visiting someone in a hospital.  Drink enough fluid to keep your urine pale yellow.  Rest at home while you recover.  Watch your condition for any changes.  Take a warm bath to relieve any burning or pain from frequent diarrhea episodes.  Keep all follow-up visits as told by your health care provider. This is important. Contact a health care provider if:  You have a fever.  Your diarrhea gets worse.  You have new symptoms.  You cannot keep fluids down.  You feel light-headed or dizzy.  You have a headache.  You have muscle cramps. Get help right away if:  You have chest pain.  You feel extremely weak or you faint.  You have bloody or black stools or stools that look like tar.  You have severe pain, cramping, or bloating in your abdomen.  You have trouble breathing or you are breathing very quickly.  Your heart is beating very quickly.  Your skin feels cold  and clammy.  You feel confused.  You have signs of dehydration, such as: ? Dark urine, very little urine, or no urine. ? Cracked lips. ? Dry mouth. ? Sunken eyes. ? Sleepiness. ? Weakness. Summary  Diarrhea is frequent loose and watery bowel movements. Diarrhea can make you feel weak and cause you to become dehydrated.  Drink enough fluids to keep your urine pale yellow.  Make sure that you wash your hands after using the toilet. If soap and water are not available, use hand sanitizer.  Contact a health care provider if your diarrhea gets worse or you have new symptoms.  Get help right away if you have signs of dehydration. This information is not intended to replace advice given to you by your health care provider. Make sure you discuss any questions you have with your health care provider. Document Revised: 05/16/2018 Document Reviewed: 06/03/2017 Elsevier Patient Education  2020 Elsevier  Inc.  

## 2019-10-25 ENCOUNTER — Emergency Department (HOSPITAL_COMMUNITY): Payer: Medicare Other

## 2019-10-25 ENCOUNTER — Inpatient Hospital Stay (HOSPITAL_COMMUNITY)
Admission: EM | Admit: 2019-10-25 | Discharge: 2019-11-12 | DRG: 308 | Disposition: E | Payer: Medicare Other | Attending: Pulmonary Disease | Admitting: Pulmonary Disease

## 2019-10-25 DIAGNOSIS — M47812 Spondylosis without myelopathy or radiculopathy, cervical region: Secondary | ICD-10-CM | POA: Diagnosis not present

## 2019-10-25 DIAGNOSIS — Z515 Encounter for palliative care: Secondary | ICD-10-CM | POA: Diagnosis not present

## 2019-10-25 DIAGNOSIS — J9601 Acute respiratory failure with hypoxia: Secondary | ICD-10-CM | POA: Diagnosis present

## 2019-10-25 DIAGNOSIS — Z87891 Personal history of nicotine dependence: Secondary | ICD-10-CM

## 2019-10-25 DIAGNOSIS — Z66 Do not resuscitate: Secondary | ICD-10-CM | POA: Diagnosis not present

## 2019-10-25 DIAGNOSIS — I4901 Ventricular fibrillation: Secondary | ICD-10-CM | POA: Diagnosis not present

## 2019-10-25 DIAGNOSIS — Z880 Allergy status to penicillin: Secondary | ICD-10-CM

## 2019-10-25 DIAGNOSIS — M810 Age-related osteoporosis without current pathological fracture: Secondary | ICD-10-CM | POA: Diagnosis present

## 2019-10-25 DIAGNOSIS — E1151 Type 2 diabetes mellitus with diabetic peripheral angiopathy without gangrene: Secondary | ICD-10-CM | POA: Diagnosis not present

## 2019-10-25 DIAGNOSIS — E876 Hypokalemia: Secondary | ICD-10-CM | POA: Diagnosis not present

## 2019-10-25 DIAGNOSIS — Z7951 Long term (current) use of inhaled steroids: Secondary | ICD-10-CM

## 2019-10-25 DIAGNOSIS — I5021 Acute systolic (congestive) heart failure: Secondary | ICD-10-CM | POA: Diagnosis present

## 2019-10-25 DIAGNOSIS — S0990XA Unspecified injury of head, initial encounter: Secondary | ICD-10-CM | POA: Diagnosis not present

## 2019-10-25 DIAGNOSIS — I469 Cardiac arrest, cause unspecified: Secondary | ICD-10-CM | POA: Diagnosis present

## 2019-10-25 DIAGNOSIS — G931 Anoxic brain damage, not elsewhere classified: Secondary | ICD-10-CM | POA: Diagnosis present

## 2019-10-25 DIAGNOSIS — N179 Acute kidney failure, unspecified: Secondary | ICD-10-CM | POA: Diagnosis not present

## 2019-10-25 DIAGNOSIS — Z7901 Long term (current) use of anticoagulants: Secondary | ICD-10-CM

## 2019-10-25 DIAGNOSIS — F32A Depression, unspecified: Secondary | ICD-10-CM | POA: Diagnosis not present

## 2019-10-25 DIAGNOSIS — S199XXA Unspecified injury of neck, initial encounter: Secondary | ICD-10-CM | POA: Diagnosis not present

## 2019-10-25 DIAGNOSIS — Z800 Family history of malignant neoplasm of digestive organs: Secondary | ICD-10-CM

## 2019-10-25 DIAGNOSIS — J690 Pneumonitis due to inhalation of food and vomit: Secondary | ICD-10-CM | POA: Diagnosis not present

## 2019-10-25 DIAGNOSIS — I252 Old myocardial infarction: Secondary | ICD-10-CM

## 2019-10-25 DIAGNOSIS — Z853 Personal history of malignant neoplasm of breast: Secondary | ICD-10-CM

## 2019-10-25 DIAGNOSIS — E785 Hyperlipidemia, unspecified: Secondary | ICD-10-CM | POA: Diagnosis present

## 2019-10-25 DIAGNOSIS — I110 Hypertensive heart disease with heart failure: Secondary | ICD-10-CM | POA: Diagnosis present

## 2019-10-25 DIAGNOSIS — R6889 Other general symptoms and signs: Secondary | ICD-10-CM | POA: Diagnosis not present

## 2019-10-25 DIAGNOSIS — J449 Chronic obstructive pulmonary disease, unspecified: Secondary | ICD-10-CM | POA: Diagnosis not present

## 2019-10-25 DIAGNOSIS — I462 Cardiac arrest due to underlying cardiac condition: Secondary | ICD-10-CM | POA: Diagnosis present

## 2019-10-25 DIAGNOSIS — Z6841 Body Mass Index (BMI) 40.0 and over, adult: Secondary | ICD-10-CM

## 2019-10-25 DIAGNOSIS — Z86711 Personal history of pulmonary embolism: Secondary | ICD-10-CM

## 2019-10-25 DIAGNOSIS — G253 Myoclonus: Secondary | ICD-10-CM | POA: Diagnosis present

## 2019-10-25 DIAGNOSIS — Z20822 Contact with and (suspected) exposure to covid-19: Secondary | ICD-10-CM | POA: Diagnosis present

## 2019-10-25 DIAGNOSIS — Z825 Family history of asthma and other chronic lower respiratory diseases: Secondary | ICD-10-CM

## 2019-10-25 DIAGNOSIS — I4510 Unspecified right bundle-branch block: Secondary | ICD-10-CM | POA: Diagnosis not present

## 2019-10-25 DIAGNOSIS — Z888 Allergy status to other drugs, medicaments and biological substances status: Secondary | ICD-10-CM

## 2019-10-25 DIAGNOSIS — Z743 Need for continuous supervision: Secondary | ICD-10-CM | POA: Diagnosis not present

## 2019-10-25 DIAGNOSIS — I4819 Other persistent atrial fibrillation: Secondary | ICD-10-CM | POA: Diagnosis not present

## 2019-10-25 DIAGNOSIS — I5032 Chronic diastolic (congestive) heart failure: Secondary | ICD-10-CM | POA: Diagnosis present

## 2019-10-25 DIAGNOSIS — J811 Chronic pulmonary edema: Secondary | ICD-10-CM | POA: Diagnosis not present

## 2019-10-25 DIAGNOSIS — E039 Hypothyroidism, unspecified: Secondary | ICD-10-CM | POA: Diagnosis present

## 2019-10-25 DIAGNOSIS — D649 Anemia, unspecified: Secondary | ICD-10-CM | POA: Diagnosis present

## 2019-10-25 DIAGNOSIS — E1165 Type 2 diabetes mellitus with hyperglycemia: Secondary | ICD-10-CM | POA: Diagnosis not present

## 2019-10-25 DIAGNOSIS — E872 Acidosis: Secondary | ICD-10-CM | POA: Diagnosis not present

## 2019-10-25 DIAGNOSIS — I4891 Unspecified atrial fibrillation: Secondary | ICD-10-CM | POA: Diagnosis not present

## 2019-10-25 DIAGNOSIS — G40411 Other generalized epilepsy and epileptic syndromes, intractable, with status epilepticus: Secondary | ICD-10-CM | POA: Diagnosis not present

## 2019-10-25 DIAGNOSIS — J3489 Other specified disorders of nose and nasal sinuses: Secondary | ICD-10-CM | POA: Diagnosis not present

## 2019-10-25 DIAGNOSIS — Z9981 Dependence on supplemental oxygen: Secondary | ICD-10-CM | POA: Diagnosis not present

## 2019-10-25 DIAGNOSIS — J310 Chronic rhinitis: Secondary | ICD-10-CM | POA: Diagnosis present

## 2019-10-25 DIAGNOSIS — Z86718 Personal history of other venous thrombosis and embolism: Secondary | ICD-10-CM

## 2019-10-25 DIAGNOSIS — Z8249 Family history of ischemic heart disease and other diseases of the circulatory system: Secondary | ICD-10-CM

## 2019-10-25 DIAGNOSIS — R404 Transient alteration of awareness: Secondary | ICD-10-CM | POA: Diagnosis not present

## 2019-10-25 DIAGNOSIS — Z79899 Other long term (current) drug therapy: Secondary | ICD-10-CM

## 2019-10-25 DIAGNOSIS — Z91040 Latex allergy status: Secondary | ICD-10-CM

## 2019-10-25 DIAGNOSIS — I499 Cardiac arrhythmia, unspecified: Secondary | ICD-10-CM | POA: Diagnosis not present

## 2019-10-25 DIAGNOSIS — R079 Chest pain, unspecified: Secondary | ICD-10-CM | POA: Diagnosis not present

## 2019-10-25 DIAGNOSIS — F419 Anxiety disorder, unspecified: Secondary | ICD-10-CM | POA: Diagnosis present

## 2019-10-25 LAB — BASIC METABOLIC PANEL
Anion gap: 15 (ref 5–15)
BUN: 16 mg/dL (ref 8–23)
CO2: 22 mmol/L (ref 22–32)
Calcium: 7.5 mg/dL — ABNORMAL LOW (ref 8.9–10.3)
Chloride: 98 mmol/L (ref 98–111)
Creatinine, Ser: 1.58 mg/dL — ABNORMAL HIGH (ref 0.44–1.00)
GFR, Estimated: 33 mL/min — ABNORMAL LOW (ref >60)
Glucose, Bld: 368 mg/dL — ABNORMAL HIGH (ref 70–99)
Potassium: 4 mmol/L (ref 3.5–5.1)
Sodium: 135 mmol/L (ref 135–145)

## 2019-10-25 LAB — CBC
HCT: 31.9 % — ABNORMAL LOW (ref 36.0–46.0)
Hemoglobin: 9.5 g/dL — ABNORMAL LOW (ref 12.0–15.0)
MCH: 28.2 pg (ref 26.0–34.0)
MCHC: 29.8 g/dL — ABNORMAL LOW (ref 30.0–36.0)
MCV: 94.7 fL (ref 80.0–100.0)
Platelets: 303 10*3/uL (ref 150–400)
RBC: 3.37 MIL/uL — ABNORMAL LOW (ref 3.87–5.11)
RDW: 15.2 % (ref 11.5–15.5)
WBC: 22.4 10*3/uL — ABNORMAL HIGH (ref 4.0–10.5)
nRBC: 0.3 % — ABNORMAL HIGH (ref 0.0–0.2)

## 2019-10-25 LAB — I-STAT VENOUS BLOOD GAS, ED
Acid-base deficit: 8 mmol/L — ABNORMAL HIGH (ref 0.0–2.0)
Bicarbonate: 23.2 mmol/L (ref 20.0–28.0)
Calcium, Ion: 1 mmol/L — ABNORMAL LOW (ref 1.15–1.40)
HCT: 30 % — ABNORMAL LOW (ref 36.0–46.0)
Hemoglobin: 10.2 g/dL — ABNORMAL LOW (ref 12.0–15.0)
O2 Saturation: 99 %
Potassium: 4.1 mmol/L (ref 3.5–5.1)
Sodium: 136 mmol/L (ref 135–145)
TCO2: 26 mmol/L (ref 22–32)
pCO2, Ven: 78.5 mmHg (ref 44.0–60.0)
pH, Ven: 7.078 — CL (ref 7.250–7.430)
pO2, Ven: 189 mmHg — ABNORMAL HIGH (ref 32.0–45.0)

## 2019-10-25 LAB — I-STAT ARTERIAL BLOOD GAS, ED
Acid-base deficit: 3 mmol/L — ABNORMAL HIGH (ref 0.0–2.0)
Bicarbonate: 23.4 mmol/L (ref 20.0–28.0)
Calcium, Ion: 1.02 mmol/L — ABNORMAL LOW (ref 1.15–1.40)
HCT: 27 % — ABNORMAL LOW (ref 36.0–46.0)
Hemoglobin: 9.2 g/dL — ABNORMAL LOW (ref 12.0–15.0)
O2 Saturation: 100 %
Patient temperature: 96.6
Potassium: 3.6 mmol/L (ref 3.5–5.1)
Sodium: 136 mmol/L (ref 135–145)
TCO2: 25 mmol/L (ref 22–32)
pCO2 arterial: 47.9 mmHg (ref 32.0–48.0)
pH, Arterial: 7.291 — ABNORMAL LOW (ref 7.350–7.450)
pO2, Arterial: 185 mmHg — ABNORMAL HIGH (ref 83.0–108.0)

## 2019-10-25 LAB — ETHANOL: Alcohol, Ethyl (B): <10 mg/dL (ref <10)

## 2019-10-25 LAB — ACETAMINOPHEN LEVEL: Acetaminophen (Tylenol), Serum: <10 ug/mL — ABNORMAL LOW (ref 10–30)

## 2019-10-25 LAB — I-STAT CHEM 8, ED
BUN: 17 mg/dL (ref 8–23)
Calcium, Ion: 1.03 mmol/L — ABNORMAL LOW (ref 1.15–1.40)
Chloride: 95 mmol/L — ABNORMAL LOW (ref 98–111)
Creatinine, Ser: 1.5 mg/dL — ABNORMAL HIGH (ref 0.44–1.00)
Glucose, Bld: 361 mg/dL — ABNORMAL HIGH (ref 70–99)
HCT: 27 % — ABNORMAL LOW (ref 36.0–46.0)
Hemoglobin: 9.2 g/dL — ABNORMAL LOW (ref 12.0–15.0)
Potassium: 4.1 mmol/L (ref 3.5–5.1)
Sodium: 135 mmol/L (ref 135–145)
TCO2: 25 mmol/L (ref 22–32)

## 2019-10-25 LAB — PROTIME-INR
INR: 1.7 — ABNORMAL HIGH (ref 0.8–1.2)
Prothrombin Time: 19.7 seconds — ABNORMAL HIGH (ref 11.4–15.2)

## 2019-10-25 LAB — LACTIC ACID, PLASMA: Lactic Acid, Venous: 7.6 mmol/L (ref 0.5–1.9)

## 2019-10-25 LAB — RESPIRATORY PANEL BY RT PCR (FLU A&B, COVID)
Influenza A by PCR: NEGATIVE
Influenza B by PCR: NEGATIVE
SARS Coronavirus 2 by RT PCR: NEGATIVE

## 2019-10-25 LAB — CBG MONITORING, ED: Glucose-Capillary: 339 mg/dL — ABNORMAL HIGH (ref 70–99)

## 2019-10-25 LAB — MAGNESIUM: Magnesium: 1.9 mg/dL (ref 1.7–2.4)

## 2019-10-25 LAB — TROPONIN I (HIGH SENSITIVITY): Troponin I (High Sensitivity): 57 ng/L — ABNORMAL HIGH (ref <18)

## 2019-10-25 MED ORDER — CIPROFLOXACIN IN D5W 400 MG/200ML IV SOLN: 400.0000 mg | Freq: Once | INTRAVENOUS | Status: DC

## 2019-10-25 MED ORDER — FENTANYL CITRATE (PF) 100 MCG/2ML IJ SOLN: 50.0000 ug | Freq: Once | INTRAMUSCULAR | Status: DC

## 2019-10-25 MED ORDER — ETOMIDATE 2 MG/ML IV SOLN
INTRAVENOUS | Status: AC | PRN
  Administered 2019-10-25: 20 mg via INTRAVENOUS

## 2019-10-25 MED ORDER — CLINDAMYCIN PHOSPHATE 600 MG/50ML IV SOLN: 600.0000 mg | Freq: Once | INTRAVENOUS | Status: DC

## 2019-10-25 MED ORDER — MAGNESIUM SULFATE 2 GM/50ML IV SOLN
2.0000 g | Freq: Once | INTRAVENOUS | Status: AC
  Administered 2019-10-25: 2 g via INTRAVENOUS
  Filled 2019-10-25: qty 50

## 2019-10-25 MED ORDER — SODIUM CHLORIDE 0.9 % IV SOLN
INTRAVENOUS | Status: AC | PRN
  Administered 2019-10-25: 1000 mL via INTRAVENOUS

## 2019-10-25 MED ORDER — VANCOMYCIN HCL 2000 MG/400ML IV SOLN
2000.0000 mg | Freq: Once | INTRAVENOUS | Status: AC
  Administered 2019-10-25: 2000 mg via INTRAVENOUS
  Filled 2019-10-25: qty 400

## 2019-10-25 MED ORDER — FENTANYL 2500MCG IN NS 250ML (10MCG/ML) PREMIX INFUSION
25.0000 ug/h | INTRAVENOUS | Status: DC
  Administered 2019-10-26: 400 ug/h via INTRAVENOUS
  Administered 2019-10-26: 25 ug/h via INTRAVENOUS
  Administered 2019-10-26 – 2019-10-28 (×8): 400 ug/h via INTRAVENOUS
  Filled 2019-10-25 (×10): qty 250

## 2019-10-25 MED ORDER — ROCURONIUM BROMIDE 50 MG/5ML IV SOLN
INTRAVENOUS | Status: AC | PRN
  Administered 2019-10-25: 100 mg via INTRAVENOUS

## 2019-10-25 MED ORDER — METRONIDAZOLE IN NACL 5-0.79 MG/ML-% IV SOLN
500.0000 mg | Freq: Once | INTRAVENOUS | Status: AC
  Administered 2019-10-25: 500 mg via INTRAVENOUS
  Filled 2019-10-25: qty 100

## 2019-10-25 MED ORDER — EPINEPHRINE HCL 5 MG/250ML IV SOLN IN NS
0.5000 ug/min | INTRAVENOUS | Status: DC
  Administered 2019-10-25: 5 ug/min via INTRAVENOUS
  Administered 2019-10-26: 16:00:00 14 ug/min via INTRAVENOUS
  Administered 2019-10-26: 12 ug/min via INTRAVENOUS
  Administered 2019-10-26: 14 ug/min via INTRAVENOUS
  Administered 2019-10-27: 12:00:00 13 ug/min via INTRAVENOUS
  Administered 2019-10-27 (×2): 12 ug/min via INTRAVENOUS
  Administered 2019-10-27: 13 ug/min via INTRAVENOUS
  Administered 2019-10-28: 04:00:00 14 ug/min via INTRAVENOUS
  Administered 2019-10-28: 09:00:00 13 ug/min via INTRAVENOUS
  Filled 2019-10-25 (×10): qty 250

## 2019-10-25 MED ORDER — SODIUM CHLORIDE 0.9 % IV SOLN
2.0000 g | Freq: Two times a day (BID) | INTRAVENOUS | Status: DC
  Administered 2019-10-26 – 2019-10-27 (×4): 2 g via INTRAVENOUS
  Filled 2019-10-25 (×4): qty 2

## 2019-10-25 MED ORDER — SODIUM CHLORIDE 0.9 % IV SOLN
2.0000 g | Freq: Once | INTRAVENOUS | Status: AC
  Administered 2019-10-25: 2 g via INTRAVENOUS
  Filled 2019-10-25: qty 2

## 2019-10-25 MED ORDER — FENTANYL BOLUS VIA INFUSION
25.0000 ug | INTRAVENOUS | Status: DC | PRN
  Filled 2019-10-25: qty 25

## 2019-10-25 NOTE — ED Triage Notes (Signed)
Per EMS, pt had N/V/D this evening.  Went to [facility], found unresponsive b/t toliet and tub.  15 minutes down time.  Total CPR 93min., got pulses after 3 epi, in truck and lost them again, 2 more rounds of CPR and 2 epi., now on a  Epi drip and King airway in place.

## 2019-10-25 NOTE — ED Notes (Signed)
Infromed provider that family is here and placed in consultation room.  Provider went to speak to family.

## 2019-10-25 NOTE — ED Notes (Signed)
Admitting provider bedside 

## 2019-10-25 NOTE — Progress Notes (Signed)
   10/29/2019 2236  Clinical Encounter Type  Visited With Family  Visit Type Initial  Spiritual Encounters  Spiritual Needs Prayer  Stress Factors  Family Stress Factors Health changes   Chaplain encountered Pt's husband, Audry Pili, when passing the consult room. Pt's husband had just arrived and was waiting for an update from the medical team. Chaplain provided emotional support and prayer. Chaplain remains available as needed.  This note was prepared by Chaplain Resident, Dante Gang, MDiv. Chaplain remains available as needed through the on-call pager: [phone].

## 2019-10-25 NOTE — Progress Notes (Signed)
Pharmacy Antibiotic Note  [name] [name] is a 69 y.o. female admitted on 10/27/2019 with pneumonia.  Pharmacy has been consulted for cefepime dosing.  Noted pcn allergy >10y ago without hospitalization, low cross sensitivity with cefepime, will trial.    Plan: Cefepime 2g IV every 12 hours Monitor renal function, clinical progression and LOT  Height: 5' 6.5" (168.9 cm) IBW/kg (Calculated) : 60.45  No data recorded.  Recent Labs  Lab 10/22/2019 2227 10/16/2019 2229  WBC  --  22.4*  CREATININE 1.50*  --     Estimated Creatinine Clearance: 44.1 mL/min (A) (by C-G formula based on SCr of 1.5 mg/dL (H)).    Allergies  Allergen Reactions  . Duloxetine Other (See Comments)    REACTION: rhabdomyolysis  . Penicillins Other (See Comments)    SYNCOPE Has patient had a PCN reaction causing immediate rash, facial/tongue/throat swelling, SOB or lightheadedness with hypotension: Yes Has patient had a PCN reaction causing severe rash involving mucus membranes or skin necrosis: No Has patient had a PCN reaction that required hospitalization No Has patient had a PCN reaction occurring within the last 10 years: No If all of the above answers are "NO", then may proceed with Cephalosporin use.   . Latex Rash    Bertis Ruddy, PharmD Clinical Pharmacist ED Pharmacist Phone # [phone] 10/21/2019 10:43 PM

## 2019-10-25 NOTE — ED Notes (Signed)
Provider informed RN that we are going to do a code cool on pt.  Ice placed on pt.

## 2019-10-25 NOTE — ED Notes (Signed)
EPI drip started @ 75mcg/min per provider

## 2019-10-25 NOTE — ED Notes (Signed)
RN brought spouse back to pt.  RN explained pt's current condition prior to bringing husband into room.

## 2019-10-25 NOTE — ED Notes (Signed)
Informed Dr. Billy Fischer of pt's critical Lactic result

## 2019-10-25 NOTE — ED Provider Notes (Signed)
[facility] EMERGENCY DEPARTMENT Provider Note   CSN: [id number] Arrival date & time: 10/23/2019  2153     History Chief Complaint  Patient presents with  . Cardiac Arrest    KALISE FICKETT is a 69 y.o. female with history of A. Fib on eliquis, COPD, PVD, obesity, hypertension, hyperlipidemia, PE who presented to the ED via EMS as a postarrest.  Per EMS, has been having nausea, vomiting, diarrhea for 1 week.  Seen at urgent care and prescribed Zofran.  Covid negative.  Patient went to the bathroom this evening and was found unresponsive few minutes later by family on the floor.  15-minute downtime until EMS arrival. Total CPR time 20 minutes, shocked twice V. fib arrest and give 3 doses of epi.  ROSC achieved, then 2 more rounds of CPR and 2 more epi after lost pulses in the ambulance.  On arrival, patient attempting spontaneous respirations and pupils are equal and reactive, but GCS 3T King airway in place.   Patient does have DNR, which family does not want to honor.  This was not known to EMS prior to the first round of CPR.   Cardiac Arrest Witnessed by:  Not witnessed Incident location:  Home Time before BLS initiated:  > 5 minutes Time before ALS initiated:  > 10 minutes Condition upon EMS arrival:  Unresponsive Pulse:  Absent Initial cardiac rhythm per EMS:  Ventricular fibrillation Treatments prior to arrival:  ACLS protocol and vascular access Medications given prior to ED:  Epinephrine IV access type:  Intraosseous Rhythm on admission to ED:  Normal sinus Associated symptoms: vomiting   Risk factors: COPD        Past Medical History:  Diagnosis Date  . Anemia    hx  . Anxiety   . Arthritis   . Asthma   . Atrial fibrillation and flutter ([place])   . Atypical lobular hyperplasia of right breast 05/09/2015  . Breast CA ([place])    ?  Marland Kitchen Chronic rhinitis   . COPD (chronic obstructive pulmonary disease) ([place])    Wert. PFTs 07/25/06 FEV1 55% ratio 53,  DLC0 51% HFA 50% 12/16/2009  . Depression   . FRACTURE, RIB, RIGHT 06/18/2009  . Glucose intolerance (impaired glucose tolerance)    on steroids  . History of DVT (deep vein thrombosis)   . Hx of cardiac catheterization    LHC (01/2001):  Normal Cors.  EF 65%.;  LexiScan Myoview (03/2013): No ischemia, EF 61%, normal  . Hx of echocardiogram    Echo (11/2011):  Mod LVH, EF 60-65%, no RWMA, MAC, mild LAE, PASP 34.  Marland Kitchen HYPERLIPIDEMIA   . Hypertension   . Hypothyroidism   . Morbid obesity ([place])    target wt=179lb for BMI<30 Peak wt 282lb  . Osteoporosis    last BMD 4/08 -2.4, intolerant of bisphosphonates  . Peripheral vascular disease (HCC)    hx dvt.pe  . Pneumonia    hx  . PONV (postoperative nausea and vomiting)   . PREMATURE VENTRICULAR CONTRACTIONS   . PULMONARY EMBOLISM, HX OF 06/19/2007  . Recurrent upper respiratory infection (URI)   . Rhabdomyolysis 07/29/2009  . Tremor   . VITAMIN D DEFICIENCY     Patient Active Problem List   Diagnosis Date Noted  . Cardiac arrest ([place]) 10/26/2019  . Non-intractable vomiting 10/22/2019  . Diarrhea 10/22/2019  . Weakness 10/22/2019  . History of abdominal pain 10/22/2019  . Secondary hypercoagulable state ([place]) 05/14/2019  . Persistent atrial fibrillation (  [place])   . Female proctocele without uterine prolapse 10/03/2018  . Body mass index (BMI) 40.0-44.9, adult 10/03/2018  . Bradycardia 04/12/2018  . Adrenal adenoma, right 03/26/2018  . Cataracts, bilateral 03/07/2018  . Aortic stenosis, mild 03/07/2018  . Fatty liver 03/07/2018  . Kidney cysts 03/07/2018  . DDD (degenerative disc disease), lumbar 03/07/2018  . Hypothyroidism 03/07/2018  . Dizziness 10/27/2017  . Right groin pain 05/13/2017  . Do not resuscitate discussion 01/19/2017  . Acute upper respiratory infection 09/14/2016  . Atrial fibrillation with RVR ([place]) 09/14/2016  . Allergic conjunctivitis 07/28/2016  . Anemia   . Upper airway cough syndrome 12/25/2015  . Chronic  pansinusitis 09/25/2015  . Recurrent sinus infections 08/30/2015  . Encounter for pre-operative cardiovascular clearance 05/20/2015  . Morbid obesity ([place]) 05/20/2015  . Atypical lobular hyperplasia of right breast 05/09/2015  . Chronic diastolic CHF (congestive heart failure) ([place]) 12/04/2014  . Acute on chronic diastolic CHF (congestive heart failure), NYHA class 1 ([place]) 11/11/2014  . Acute sinus infection 09/03/2014  . Rib pain on right side 04/11/2014  . Paresthesias in left hand 03/04/2014  . Bilateral knee pain 02/13/2014  . Nausea alone 07/10/2013  . Encounter for therapeutic drug monitoring 02/09/2013  . COPD exacerbation ([place]) 01/17/2013  . Chest pain 04/19/2012  . Suicide gesture ([place]) 03/30/2012  . Major depressive disorder, single episode, unspecified 03/30/2012  . Tremor due to multiple drugs 11/11/2011  . Diplopia 11/11/2011  . Allergic rhinitis 09/14/2011  . Right leg weakness 04/14/2011  . Long term current use of anticoagulant therapy 04/22/2010  . Encounter for well adult exam with abnormal findings 04/15/2010  . Chronic rhinitis 04/15/2010  . PREMATURE VENTRICULAR CONTRACTIONS 10/23/2008  . Atrial flutter ([place]) 07/01/2008  . Cough 06/20/2008  . Vitamin D deficiency 10/27/2007  . HLD (hyperlipidemia) 06/19/2007  . Anxiety state 06/19/2007  . PAF (paroxysmal atrial fibrillation) ([place]) 06/19/2007  . PULMONARY EMBOLISM, HX OF 06/19/2007  . Edema 03/20/2007  . Chronic respiratory failure with hypoxia and hypercapnia ([place]) 01/13/2007  . Depression 08/06/2006  . COPD GOLD II 08/06/2006  . Morbid obesity due to excess calories ([place]) 07/29/2006  . Essential hypertension 07/29/2006  . OSTEOPOROSIS 07/29/2006  . DEEP VENOUS THROMBOPHLEBITIS, HX OF 07/29/2006    Past Surgical History:  Procedure Laterality Date  . A-FLUTTER ABLATION N/A 10/08/2016   Procedure: A-FLUTTER ABLATION;  Surgeon: Evans Lance, MD;  Location: [place] CV LAB;  Service: Cardiovascular;   Laterality: N/A;  . ABDOMINAL HYSTERECTOMY    . BREAST LUMPECTOMY WITH RADIOACTIVE SEED LOCALIZATION Right 06/05/2015   Procedure: RIGHT BREAST LUMPECTOMY WITH RADIOACTIVE SEED LOCALIZATION;  Surgeon: Fanny Skates, MD;  Location: [place];  Service: General;  Laterality: Right;  . BREAST SURGERY Left    s/p mass removal  . CARDIOVERSION N/A 12/11/2014   Procedure: CARDIOVERSION;  Surgeon: Josue Hector, MD;  Location: [place];  Service: Cardiovascular;  Laterality: N/A;  . CARDIOVERSION N/A 09/15/2016   Procedure: CARDIOVERSION;  Surgeon: Jerline Pain, MD;  Location: [facility] ENDOSCOPY;  Service: Cardiovascular;  Laterality: N/A;  . CARDIOVERSION N/A 04/09/2019   Procedure: CARDIOVERSION;  Surgeon: Pixie Casino, MD;  Location: [facility] ENDOSCOPY;  Service: Cardiovascular;  Laterality: N/A;  . CARDIOVERSION N/A 06/14/2019   Procedure: CARDIOVERSION;  Surgeon: Lelon Perla, MD;  Location: [facility] ENDOSCOPY;  Service: Cardiovascular;  Laterality: N/A;  . COLONOSCOPY  10/22/2003, ?2006  . COLONOSCOPY WITH PROPOFOL N/A 01/15/2016   Procedure: COLONOSCOPY WITH PROPOFOL;  Surgeon: Gatha Mayer,  MD;  Location: [facility] ENDOSCOPY;  Service: Endoscopy;  Laterality: N/A;  . ESOPHAGOGASTRODUODENOSCOPY (EGD) WITH PROPOFOL N/A 01/15/2016   Procedure: ESOPHAGOGASTRODUODENOSCOPY (EGD) WITH PROPOFOL;  Surgeon: Gatha Mayer, MD;  Location: [facility] ENDOSCOPY;  Service: Endoscopy;  Laterality: N/A;  . s/p left arm fracture with fall off chair    . skin graft to middle R finger  1975  . TEE WITHOUT CARDIOVERSION N/A 12/11/2014   Procedure: TRANSESOPHAGEAL ECHOCARDIOGRAM (TEE);  Surgeon: Josue Hector, MD;  Location: [facility] ENDOSCOPY;  Service: Cardiovascular;  Laterality: N/A;  . TONSILLECTOMY       OB History   No obstetric history on file.     Family History  Problem Relation Age of Onset  . Heart disease Mother   . Liver disease Mother        alcohol related  . Asthma Brother   . Esophageal cancer Brother   . Asthma  Son   . Colon cancer Neg Hx   . Stomach cancer Neg Hx   . Pancreatic cancer Neg Hx   . Inflammatory bowel disease Neg Hx   . Allergic rhinitis Neg Hx   . Eczema Neg Hx   . Urticaria Neg Hx     Social History   Tobacco Use  . Smoking status: Former Smoker    Packs/day: 1.00    Years: 35.00    Pack years: 35.00    Types: Cigarettes    Quit date: 01/11/2006    Years since quitting: 13.7  . Smokeless tobacco: Never Used  Vaping Use  . Vaping Use: Never used  Substance Use Topics  . Alcohol use: No    Alcohol/week: 0.0 standard drinks  . Drug use: No    Home Medications Prior to Admission medications   Medication Sig Start Date End Date Taking? Authorizing Provider  albuterol (VENTOLIN HFA) 108 (90 Base) MCG/ACT inhaler Inhale 2 puffs into the lungs as needed for wheezing or shortness of breath.  12/29/18   [provider]  amLODipine (NORVASC) 5 MG tablet amlodipine 5 mg tablet    [provider]  apixaban (ELIQUIS) 5 MG TABS tablet Take 1 tablet (5 mg total) by mouth 2 (two) times daily. 08/03/19   Lelon Perla, MD  atorvastatin (LIPITOR) 10 MG tablet Take 1 tablet (10 mg total) by mouth daily at 6 PM. 08/31/18   McLean-Scocuzza, Nino Glow, MD  azelastine (ASTELIN) 0.1 % nasal spray Place 2 sprays into both nostrils at bedtime as needed for rhinitis. Use in each nostril as directed 03/12/18   McLean-Scocuzza, Nino Glow, MD  Calcium Carbonate-Vitamin D 600-400 MG-UNIT per tablet Take 1 tablet by mouth daily.     [provider]  dofetilide (TIKOSYN) 500 MCG capsule Take 1 capsule (500 mcg total) by mouth 2 (two) times daily. 06/15/19   Baldwin Jamaica, PA-C  famotidine (PEPCID) 20 MG tablet Take 1 tablet (20 mg total) by mouth daily. At bedtime 03/04/19   McLean-Scocuzza, Nino Glow, MD  ferrous sulfate 325 (65 FE) MG tablet Take 325 mg by mouth 2 (two) times daily with a meal.    [provider]  FLUoxetine (PROZAC) 40 MG capsule Take 1 capsule (40 mg  total) by mouth daily. 06/01/19   McLean-Scocuzza, Nino Glow, MD  Fluticasone-Umeclidin-Vilant (TRELEGY ELLIPTA) 100-62.5-25 MCG/INH AEPB Inhale 1 puff into the lungs daily. 10/12/19   Icard, Octavio Graves, DO  furosemide (LASIX) 80 MG tablet TAKE 1 TABLET BY MOUTH IN  THE MORNING AND 1 TABLET  IN THE AFTERNOON 02/05/19   Lelon Perla, MD  Glucosamine-Chondroitin (COSAMIN DS PO) Take 1 tablet by mouth daily.    [provider]  KLOR-CON M20 20 MEQ tablet TAKE 1 TABLET BY MOUTH  TWICE DAILY 03/27/19   Lelon Perla, MD  levalbuterol Penne Lash) 1.25 MG/3ML nebulizer solution Take 1.25 mg by nebulization every 4 (four) hours. 09/28/16   Tanda Rockers, MD  levocetirizine (XYZAL) 5 MG tablet Take 1 tablet (5 mg total) by mouth at bedtime as needed for allergies. 04/24/19   McLean-Scocuzza, Nino Glow, MD  metoprolol tartrate (LOPRESSOR) 25 MG tablet Take 25 mg by mouth.    [provider]  olopatadine (PATANOL) 0.1 % ophthalmic solution Place 1 drop into both eyes 2 (two) times daily. 04/12/18   McLean-Scocuzza, Nino Glow, MD  OXYGEN Inhale 2.5 L/min into the lungs continuous. And 4 with exertion    [provider]  pantoprazole (PROTONIX) 40 MG tablet TAKE 1 TABLET BY MOUTH  DAILY, 30 TO 60 MINUTES  BEFORE FIRST MEAL OF THE  DAY 07/30/19   June Leap L, DO    Allergies    Duloxetine, Penicillins, and Latex  Review of Systems   Review of Systems  Unable to perform ROS: Intubated  Gastrointestinal: Positive for vomiting.    Physical Exam Updated Vital Signs BP 130/76   Pulse 75   Temp 98.6 F (37 C) (Core (Comment))   Resp (!) 26   Ht _0  (1.651 m) Comment: per spouse  Wt 106.6 kg   SpO2 99%   BMI 39.11 kg/m   Physical Exam Vitals and nursing note reviewed.  Constitutional:      Appearance: She is well-developed. She is obese.     Comments: Unresponsive, GCS 3 T  HENT:     Head: Normocephalic and atraumatic.     Mouth/Throat:     Mouth: Mucous membranes are  dry.  Eyes:     Conjunctiva/sclera: Conjunctivae normal.     Pupils: Pupils are equal, round, and reactive to light.  Cardiovascular:     Rate and Rhythm: Normal rate and regular rhythm.     Heart sounds: No murmur heard.   Pulmonary:     Breath sounds: Normal breath sounds.     Comments: Mechanically ventilated.  Coarse breath sounds throughout Abdominal:     General: There is no distension.     Palpations: Abdomen is soft.  Musculoskeletal:        General: No deformity or signs of injury.     Cervical back: Neck supple.  Skin:    General: Skin is warm and dry.  Neurological:     GCS: GCS eye subscore is 1. GCS verbal subscore is 1. GCS motor subscore is 1.     ED Results / Procedures / Treatments   Labs (all labs ordered are listed, but only abnormal results are displayed) Labs Reviewed  BASIC METABOLIC PANEL - Abnormal; Notable for the following components:      Result Value   Glucose, Bld 368 (*)    Creatinine, Ser 1.58 (*)    Calcium 7.5 (*)    GFR, Estimated 33 (*)    All other components within normal limits  CBC - Abnormal; Notable for the following components:   WBC 22.4 (*)    RBC 3.37 (*)    Hemoglobin 9.5 (*)    HCT 31.9 (*)    MCHC 29.8 (*)    nRBC 0.3 (*)  All other components within normal limits  PROTIME-INR - Abnormal; Notable for the following components:   Prothrombin Time 19.7 (*)    INR 1.7 (*)    All other components within normal limits  LACTIC ACID, PLASMA - Abnormal; Notable for the following components:   Lactic Acid, Venous 7.6 (*)    All other components within normal limits  ACETAMINOPHEN LEVEL - Abnormal; Notable for the following components:   Acetaminophen (Tylenol), Serum <10 (*)    All other components within normal limits  CBG MONITORING, ED - Abnormal; Notable for the following components:   Glucose-Capillary 339 (*)    All other components within normal limits  I-STAT CHEM 8, ED - Abnormal; Notable for the following  components:   Chloride 95 (*)    Creatinine, Ser 1.50 (*)    Glucose, Bld 361 (*)    Calcium, Ion 1.03 (*)    Hemoglobin 9.2 (*)    HCT 27.0 (*)    All other components within normal limits  I-STAT VENOUS BLOOD GAS, ED - Abnormal; Notable for the following components:   pH, Ven 7.078 (*)    pCO2, Ven 78.5 (*)    pO2, Ven 189.0 (*)    Acid-base deficit 8.0 (*)    Calcium, Ion 1.00 (*)    HCT 30.0 (*)    Hemoglobin 10.2 (*)    All other components within normal limits  I-STAT ARTERIAL BLOOD GAS, ED - Abnormal; Notable for the following components:   pH, Arterial 7.291 (*)    pO2, Arterial 185 (*)    Acid-base deficit 3.0 (*)    Calcium, Ion 1.02 (*)    HCT 27.0 (*)    Hemoglobin 9.2 (*)    All other components within normal limits  I-STAT ARTERIAL BLOOD GAS, ED - Abnormal; Notable for the following components:   pH, Arterial 7.319 (*)    Potassium 3.3 (*)    Calcium, Ion 1.02 (*)    HCT 27.0 (*)    Hemoglobin 9.2 (*)    All other components within normal limits  TROPONIN I (HIGH SENSITIVITY) - Abnormal; Notable for the following components:   Troponin I (High Sensitivity) 57 (*)    All other components within normal limits  RESPIRATORY PANEL BY RT PCR (FLU A&B, COVID)  CULTURE, BLOOD (ROUTINE X 2)  CULTURE, BLOOD (ROUTINE X 2)  MAGNESIUM  ETHANOL  HEPATIC FUNCTION PANEL  LACTIC ACID, PLASMA  BLOOD GAS, ARTERIAL  BASIC METABOLIC PANEL  BASIC METABOLIC PANEL  PROTIME-INR  APTT  BLOOD GAS, ARTERIAL  BLOOD GAS, ARTERIAL  CBC  BASIC METABOLIC PANEL  MAGNESIUM  BLOOD GAS, ARTERIAL  PHOSPHORUS  CBC WITH DIFFERENTIAL/PLATELET  TROPONIN I (HIGH SENSITIVITY)    EKG None  Radiology DG Chest Portable 1 View  Result Date: 10/13/2019 CLINICAL DATA:  Status post CPR EXAM: PORTABLE CHEST 1 VIEW COMPARISON:  04/03/2019 FINDINGS: Endotracheal tube and gastric catheter are noted in satisfactory position. Cardiac shadow is enlarged but accentuated by the frontal technique.  Aortic calcifications are seen. The lungs are well aerated with patchy airspace opacities bilaterally which may represent pulmonary edema. No acute bony abnormality is noted. IMPRESSION: Patchy airspace opacities which may represent edema given the clinical history. Tubes and lines as described. Electronically Signed   By: Inez Catalina M.D.   On: 10/14/2019 22:51    Procedures Procedures (including critical care time)  Medications Ordered in ED Medications  vancomycin (VANCOREADY) IVPB 2000 mg/400 mL (2,000 mg Intravenous New Bag/Given  10/23/2019 2301)  fentaNYL (SUBLIMAZE) injection 50 mcg (has no administration in time range)  fentaNYL 2567mg in NS 2548m(1025mml) infusion-PREMIX (has no administration in time range)  fentaNYL (SUBLIMAZE) bolus via infusion 25 mcg (has no administration in time range)  EPINEPHrine (ADRENALIN) 4 mg in NS 250 mL (0.016 mg/mL) premix infusion (5 mcg/min Intravenous New Bag/Given 11/10/2019 2238)  ceFEPIme (MAXIPIME) 2 g in sodium chloride 0.9 % 100 mL IVPB (has no administration in time range)  norepinephrine (LEVOPHED) 4mg94m 250mL49mmix infusion (has no administration in time range)  aspirin suppository 300 mg (has no administration in time range)  0.9 %  sodium chloride infusion (has no administration in time range)  pantoprazole (PROTONIX) injection 40 mg (has no administration in time range)  etomidate (AMIDATE) injection (20 mg Intravenous Given 10/19/2019 2208)  rocuronium (ZEMURON) injection (100 mg Intravenous Given 10/31/2019 2208)  0.9 %  sodium chloride infusion (1,000 mLs Intravenous New Bag/Given 10/12/2019 2217)  magnesium sulfate IVPB 2 g 50 mL (0 g Intravenous Stopped 10/20/2019 2355)  metroNIDAZOLE (FLAGYL) IVPB 500 mg (500 mg Intravenous New Bag/Given 10/23/2019 2341)  ceFEPIme (MAXIPIME) 2 g in sodium chloride 0.9 % 100 mL IVPB (0 g Intravenous Stopped 10/22/2019 2328)    ED Course  I have reviewed the triage vital signs and the nursing  notes.  Pertinent labs & imaging results that were available during my care of the patient were reviewed by me and considered in my medical decision making (see chart for details).    MDM Rules/Calculators/A&P                         On arrival, patient attempting spontaneous respirations and pupils are equal and reactive, but GCS 3T King airway in place.  Pads placed on chest and airway soft for a tube, confirmed placed on chest x-ray.  Patient started on fentanyl for sedation and given broad-spectrum antibiotics, mag.  Cooling protocol initiated.  After intubation, pH 7.2, PCO2 47.  Spoke with husband, who confirmed that patient did have a DNR and had not recently changed her mind about it.  He does not wish to withdraw care, but does wish to hold her wishes and attempt no further CPR if patient has another cardiac arrest.  Patient admitted to ICU for further management. Final Clinical Impression(s) / ED Diagnoses Final diagnoses:  Cardiac arrest with ventricular fibrillation (HCC) [place] hypoxemic respiratory failure (HCC)[facility] ED Discharge Orders    None       MorroAsencion Noble10/15/21 0047 [id number] Morgan10/1710/26/2021 [id number] Morgan10/172021-10-26

## 2019-10-26 ENCOUNTER — Inpatient Hospital Stay: Payer: Self-pay

## 2019-10-26 ENCOUNTER — Inpatient Hospital Stay (HOSPITAL_COMMUNITY): Payer: Medicare Other

## 2019-10-26 DIAGNOSIS — Z6841 Body Mass Index (BMI) 40.0 and over, adult: Secondary | ICD-10-CM | POA: Diagnosis not present

## 2019-10-26 DIAGNOSIS — Z9981 Dependence on supplemental oxygen: Secondary | ICD-10-CM | POA: Diagnosis not present

## 2019-10-26 DIAGNOSIS — G40411 Other generalized epilepsy and epileptic syndromes, intractable, with status epilepticus: Secondary | ICD-10-CM | POA: Diagnosis not present

## 2019-10-26 DIAGNOSIS — I469 Cardiac arrest, cause unspecified: Secondary | ICD-10-CM

## 2019-10-26 DIAGNOSIS — J690 Pneumonitis due to inhalation of food and vomit: Secondary | ICD-10-CM | POA: Diagnosis not present

## 2019-10-26 DIAGNOSIS — Z20822 Contact with and (suspected) exposure to covid-19: Secondary | ICD-10-CM | POA: Diagnosis present

## 2019-10-26 DIAGNOSIS — N179 Acute kidney failure, unspecified: Secondary | ICD-10-CM | POA: Diagnosis not present

## 2019-10-26 DIAGNOSIS — E872 Acidosis: Secondary | ICD-10-CM | POA: Diagnosis not present

## 2019-10-26 DIAGNOSIS — G931 Anoxic brain damage, not elsewhere classified: Secondary | ICD-10-CM | POA: Diagnosis present

## 2019-10-26 DIAGNOSIS — M810 Age-related osteoporosis without current pathological fracture: Secondary | ICD-10-CM | POA: Diagnosis present

## 2019-10-26 DIAGNOSIS — Z66 Do not resuscitate: Secondary | ICD-10-CM | POA: Diagnosis present

## 2019-10-26 DIAGNOSIS — Z515 Encounter for palliative care: Secondary | ICD-10-CM | POA: Diagnosis not present

## 2019-10-26 DIAGNOSIS — I462 Cardiac arrest due to underlying cardiac condition: Secondary | ICD-10-CM | POA: Diagnosis present

## 2019-10-26 DIAGNOSIS — J449 Chronic obstructive pulmonary disease, unspecified: Secondary | ICD-10-CM | POA: Diagnosis present

## 2019-10-26 DIAGNOSIS — J310 Chronic rhinitis: Secondary | ICD-10-CM | POA: Diagnosis present

## 2019-10-26 DIAGNOSIS — I4819 Other persistent atrial fibrillation: Secondary | ICD-10-CM | POA: Diagnosis present

## 2019-10-26 DIAGNOSIS — E039 Hypothyroidism, unspecified: Secondary | ICD-10-CM | POA: Diagnosis present

## 2019-10-26 DIAGNOSIS — J9601 Acute respiratory failure with hypoxia: Secondary | ICD-10-CM | POA: Diagnosis not present

## 2019-10-26 DIAGNOSIS — E1151 Type 2 diabetes mellitus with diabetic peripheral angiopathy without gangrene: Secondary | ICD-10-CM | POA: Diagnosis present

## 2019-10-26 DIAGNOSIS — F32A Depression, unspecified: Secondary | ICD-10-CM | POA: Diagnosis present

## 2019-10-26 DIAGNOSIS — I4891 Unspecified atrial fibrillation: Secondary | ICD-10-CM | POA: Diagnosis present

## 2019-10-26 DIAGNOSIS — I5021 Acute systolic (congestive) heart failure: Secondary | ICD-10-CM | POA: Diagnosis present

## 2019-10-26 DIAGNOSIS — E785 Hyperlipidemia, unspecified: Secondary | ICD-10-CM | POA: Diagnosis present

## 2019-10-26 DIAGNOSIS — I4901 Ventricular fibrillation: Secondary | ICD-10-CM | POA: Diagnosis present

## 2019-10-26 DIAGNOSIS — I110 Hypertensive heart disease with heart failure: Secondary | ICD-10-CM | POA: Diagnosis present

## 2019-10-26 LAB — GLUCOSE, CAPILLARY
Glucose-Capillary: 108 mg/dL — ABNORMAL HIGH (ref 70–99)
Glucose-Capillary: 108 mg/dL — ABNORMAL HIGH (ref 70–99)
Glucose-Capillary: 109 mg/dL — ABNORMAL HIGH (ref 70–99)
Glucose-Capillary: 134 mg/dL — ABNORMAL HIGH (ref 70–99)
Glucose-Capillary: 139 mg/dL — ABNORMAL HIGH (ref 70–99)
Glucose-Capillary: 150 mg/dL — ABNORMAL HIGH (ref 70–99)
Glucose-Capillary: 198 mg/dL — ABNORMAL HIGH (ref 70–99)
Glucose-Capillary: 200 mg/dL — ABNORMAL HIGH (ref 70–99)
Glucose-Capillary: 209 mg/dL — ABNORMAL HIGH (ref 70–99)
Glucose-Capillary: 213 mg/dL — ABNORMAL HIGH (ref 70–99)
Glucose-Capillary: 228 mg/dL — ABNORMAL HIGH (ref 70–99)
Glucose-Capillary: 228 mg/dL — ABNORMAL HIGH (ref 70–99)
Glucose-Capillary: 233 mg/dL — ABNORMAL HIGH (ref 70–99)
Glucose-Capillary: 234 mg/dL — ABNORMAL HIGH (ref 70–99)
Glucose-Capillary: 241 mg/dL — ABNORMAL HIGH (ref 70–99)
Glucose-Capillary: 243 mg/dL — ABNORMAL HIGH (ref 70–99)
Glucose-Capillary: 247 mg/dL — ABNORMAL HIGH (ref 70–99)
Glucose-Capillary: 310 mg/dL — ABNORMAL HIGH (ref 70–99)
Glucose-Capillary: 335 mg/dL — ABNORMAL HIGH (ref 70–99)
Glucose-Capillary: 378 mg/dL — ABNORMAL HIGH (ref 70–99)

## 2019-10-26 LAB — BASIC METABOLIC PANEL
Anion gap: 15 (ref 5–15)
Anion gap: 15 (ref 5–15)
Anion gap: 14 (ref 5–15)
BUN: 17 mg/dL (ref 8–23)
BUN: 17 mg/dL (ref 8–23)
BUN: 25 mg/dL — ABNORMAL HIGH (ref 8–23)
CO2: 18 mmol/L — ABNORMAL LOW (ref 22–32)
CO2: 19 mmol/L — ABNORMAL LOW (ref 22–32)
CO2: 19 mmol/L — ABNORMAL LOW (ref 22–32)
Calcium: 7.2 mg/dL — ABNORMAL LOW (ref 8.9–10.3)
Calcium: 7.1 mg/dL — ABNORMAL LOW (ref 8.9–10.3)
Calcium: 7.6 mg/dL — ABNORMAL LOW (ref 8.9–10.3)
Chloride: 100 mmol/L (ref 98–111)
Chloride: 102 mmol/L (ref 98–111)
Chloride: 105 mmol/L (ref 98–111)
Creatinine, Ser: 1.44 mg/dL — ABNORMAL HIGH (ref 0.44–1.00)
Creatinine, Ser: 1.44 mg/dL — ABNORMAL HIGH (ref 0.44–1.00)
Creatinine, Ser: 2.07 mg/dL — ABNORMAL HIGH (ref 0.44–1.00)
GFR, Estimated: 37 mL/min — ABNORMAL LOW (ref >60)
GFR, Estimated: 37 mL/min — ABNORMAL LOW (ref >60)
GFR, Estimated: 24 mL/min — ABNORMAL LOW (ref >60)
Glucose, Bld: 346 mg/dL — ABNORMAL HIGH (ref 70–99)
Glucose, Bld: 353 mg/dL — ABNORMAL HIGH (ref 70–99)
Glucose, Bld: 147 mg/dL — ABNORMAL HIGH (ref 70–99)
Potassium: 3.7 mmol/L (ref 3.5–5.1)
Potassium: 3.5 mmol/L (ref 3.5–5.1)
Potassium: 3 mmol/L — ABNORMAL LOW (ref 3.5–5.1)
Sodium: 133 mmol/L — ABNORMAL LOW (ref 135–145)
Sodium: 136 mmol/L (ref 135–145)
Sodium: 138 mmol/L (ref 135–145)

## 2019-10-26 LAB — CBC
HCT: 31.8 % — ABNORMAL LOW (ref 36.0–46.0)
Hemoglobin: 10.2 g/dL — ABNORMAL LOW (ref 12.0–15.0)
MCH: 29.1 pg (ref 26.0–34.0)
MCHC: 32.1 g/dL (ref 30.0–36.0)
MCV: 90.6 fL (ref 80.0–100.0)
Platelets: 287 10*3/uL (ref 150–400)
RBC: 3.51 MIL/uL — ABNORMAL LOW (ref 3.87–5.11)
RDW: 15.2 % (ref 11.5–15.5)
WBC: 21.6 10*3/uL — ABNORMAL HIGH (ref 4.0–10.5)
nRBC: 0 % (ref 0.0–0.2)

## 2019-10-26 LAB — COMPREHENSIVE METABOLIC PANEL
ALT: 182 U/L — ABNORMAL HIGH (ref 0–44)
AST: 184 U/L — ABNORMAL HIGH (ref 15–41)
Albumin: 2.8 g/dL — ABNORMAL LOW (ref 3.5–5.0)
Alkaline Phosphatase: 77 U/L (ref 38–126)
Anion gap: 12 (ref 5–15)
BUN: 22 mg/dL (ref 8–23)
CO2: 19 mmol/L — ABNORMAL LOW (ref 22–32)
Calcium: 7.5 mg/dL — ABNORMAL LOW (ref 8.9–10.3)
Chloride: 105 mmol/L (ref 98–111)
Creatinine, Ser: 1.85 mg/dL — ABNORMAL HIGH (ref 0.44–1.00)
GFR, Estimated: 27 mL/min — ABNORMAL LOW (ref >60)
Glucose, Bld: 301 mg/dL — ABNORMAL HIGH (ref 70–99)
Potassium: 2.8 mmol/L — ABNORMAL LOW (ref 3.5–5.1)
Sodium: 136 mmol/L (ref 135–145)
Total Bilirubin: 0.8 mg/dL (ref 0.3–1.2)
Total Protein: 6 g/dL — ABNORMAL LOW (ref 6.5–8.1)

## 2019-10-26 LAB — POCT I-STAT 7, (LYTES, BLD GAS, ICA,H+H)
Acid-base deficit: 5 mmol/L — ABNORMAL HIGH (ref 0.0–2.0)
Bicarbonate: 21.7 mmol/L (ref 20.0–28.0)
Calcium, Ion: 1.04 mmol/L — ABNORMAL LOW (ref 1.15–1.40)
HCT: 28 % — ABNORMAL LOW (ref 36.0–46.0)
Hemoglobin: 9.5 g/dL — ABNORMAL LOW (ref 12.0–15.0)
O2 Saturation: 97 %
Patient temperature: 36
Potassium: 3.1 mmol/L — ABNORMAL LOW (ref 3.5–5.1)
Sodium: 137 mmol/L (ref 135–145)
TCO2: 23 mmol/L (ref 22–32)
pCO2 arterial: 47.7 mmHg (ref 32.0–48.0)
pH, Arterial: 7.261 — ABNORMAL LOW (ref 7.350–7.450)
pO2, Arterial: 104 mmHg (ref 83.0–108.0)

## 2019-10-26 LAB — CBC WITH DIFFERENTIAL/PLATELET
Abs Immature Granulocytes: 0.48 10*3/uL — ABNORMAL HIGH (ref 0.00–0.07)
Basophils Absolute: 0.1 10*3/uL (ref 0.0–0.1)
Basophils Relative: 0 %
Eosinophils Absolute: 0 10*3/uL (ref 0.0–0.5)
Eosinophils Relative: 0 %
HCT: 31.1 % — ABNORMAL LOW (ref 36.0–46.0)
Hemoglobin: 9.9 g/dL — ABNORMAL LOW (ref 12.0–15.0)
Immature Granulocytes: 2 %
Lymphocytes Relative: 8 %
Lymphs Abs: 1.9 10*3/uL (ref 0.7–4.0)
MCH: 29.6 pg (ref 26.0–34.0)
MCHC: 31.8 g/dL (ref 30.0–36.0)
MCV: 93.1 fL (ref 80.0–100.0)
Monocytes Absolute: 2 10*3/uL — ABNORMAL HIGH (ref 0.1–1.0)
Monocytes Relative: 9 %
Neutro Abs: 19 10*3/uL — ABNORMAL HIGH (ref 1.7–7.7)
Neutrophils Relative %: 81 %
Platelets: 320 10*3/uL (ref 150–400)
RBC: 3.34 MIL/uL — ABNORMAL LOW (ref 3.87–5.11)
RDW: 15.3 % (ref 11.5–15.5)
WBC: 23.4 10*3/uL — ABNORMAL HIGH (ref 4.0–10.5)
nRBC: 0 % (ref 0.0–0.2)

## 2019-10-26 LAB — PROTIME-INR
INR: 1.6 — ABNORMAL HIGH (ref 0.8–1.2)
INR: 1.7 — ABNORMAL HIGH (ref 0.8–1.2)
Prothrombin Time: 18.5 seconds — ABNORMAL HIGH (ref 11.4–15.2)
Prothrombin Time: 19.7 seconds — ABNORMAL HIGH (ref 11.4–15.2)

## 2019-10-26 LAB — HEPATIC FUNCTION PANEL
ALT: 199 U/L — ABNORMAL HIGH (ref 0–44)
AST: 426 U/L — ABNORMAL HIGH (ref 15–41)
Albumin: 2.7 g/dL — ABNORMAL LOW (ref 3.5–5.0)
Alkaline Phosphatase: 81 U/L (ref 38–126)
Bilirubin, Direct: 0.5 mg/dL — ABNORMAL HIGH (ref 0.0–0.2)
Indirect Bilirubin: 1.4 mg/dL — ABNORMAL HIGH (ref 0.3–0.9)
Total Bilirubin: 1.9 mg/dL — ABNORMAL HIGH (ref 0.3–1.2)
Total Protein: 5.6 g/dL — ABNORMAL LOW (ref 6.5–8.1)

## 2019-10-26 LAB — MAGNESIUM
Magnesium: 1.9 mg/dL (ref 1.7–2.4)
Magnesium: 1.8 mg/dL (ref 1.7–2.4)

## 2019-10-26 LAB — BRAIN NATRIURETIC PEPTIDE: B Natriuretic Peptide: 2310.3 pg/mL — ABNORMAL HIGH (ref 0.0–100.0)

## 2019-10-26 LAB — I-STAT ARTERIAL BLOOD GAS, ED
Acid-base deficit: 2 mmol/L (ref 0.0–2.0)
Bicarbonate: 23.9 mmol/L (ref 20.0–28.0)
Calcium, Ion: 1.02 mmol/L — ABNORMAL LOW (ref 1.15–1.40)
HCT: 27 % — ABNORMAL LOW (ref 36.0–46.0)
Hemoglobin: 9.2 g/dL — ABNORMAL LOW (ref 12.0–15.0)
O2 Saturation: 96 %
Patient temperature: 98.6
Potassium: 3.3 mmol/L — ABNORMAL LOW (ref 3.5–5.1)
Sodium: 136 mmol/L (ref 135–145)
TCO2: 25 mmol/L (ref 22–32)
pCO2 arterial: 46.5 mmHg (ref 32.0–48.0)
pH, Arterial: 7.319 — ABNORMAL LOW (ref 7.350–7.450)
pO2, Arterial: 90 mmHg (ref 83.0–108.0)

## 2019-10-26 LAB — APTT
aPTT: 30 seconds (ref 24–36)
aPTT: 32 seconds (ref 24–36)

## 2019-10-26 LAB — PHOSPHORUS
Phosphorus: 4.5 mg/dL (ref 2.5–4.6)
Phosphorus: 5.1 mg/dL — ABNORMAL HIGH (ref 2.5–4.6)

## 2019-10-26 LAB — MRSA PCR SCREENING: MRSA by PCR: NEGATIVE

## 2019-10-26 LAB — TROPONIN I (HIGH SENSITIVITY): Troponin I (High Sensitivity): 280 ng/L (ref <18)

## 2019-10-26 LAB — PROCALCITONIN: Procalcitonin: 20.05 ng/mL

## 2019-10-26 LAB — LACTIC ACID, PLASMA
Lactic Acid, Venous: 4.6 mmol/L (ref 0.5–1.9)
Lactic Acid, Venous: 4.6 mmol/L (ref 0.5–1.9)
Lactic Acid, Venous: 5.1 mmol/L (ref 0.5–1.9)

## 2019-10-26 LAB — TRIGLYCERIDES: Triglycerides: 64 mg/dL (ref <150)

## 2019-10-26 MED ORDER — VITAL AF 1.2 CAL PO LIQD
1000.0000 mL | ORAL | Status: DC
  Administered 2019-10-26: 1000 mL

## 2019-10-26 MED ORDER — NOREPINEPHRINE 4 MG/250ML-% IV SOLN: 0.0000 ug/min | INTRAVENOUS | Status: DC

## 2019-10-26 MED ORDER — VALPROATE SODIUM 500 MG/5ML IV SOLN
15.0000 mg/kg/d | Freq: Two times a day (BID) | INTRAVENOUS | Status: DC
  Administered 2019-10-26 – 2019-10-27 (×4): 604 mg via INTRAVENOUS
  Filled 2019-10-26 (×6): qty 6.04

## 2019-10-26 MED ORDER — DEXTROSE IN LACTATED RINGERS 5 % IV SOLN: INTRAVENOUS | Status: DC

## 2019-10-26 MED ORDER — ORAL CARE MOUTH RINSE: 15.0000 mL | OROMUCOSAL | Status: DC

## 2019-10-26 MED ORDER — MIDAZOLAM 50MG/50ML (1MG/ML) PREMIX INFUSION
0.5000 mg/h | INTRAVENOUS | Status: DC
  Administered 2019-10-26: 0.5 mg/h via INTRAVENOUS
  Administered 2019-10-26: 8 mg/h via INTRAVENOUS
  Administered 2019-10-27 (×2): 20 mg/h via INTRAVENOUS
  Administered 2019-10-27: 10 mg/h via INTRAVENOUS
  Administered 2019-10-27: 20 mg/h via INTRAVENOUS
  Administered 2019-10-27: 10 mg/h via INTRAVENOUS
  Administered 2019-10-27 – 2019-10-28 (×6): 20 mg/h via INTRAVENOUS
  Filled 2019-10-26: qty 100
  Filled 2019-10-26 (×7): qty 50
  Filled 2019-10-26: qty 100
  Filled 2019-10-26 (×3): qty 50

## 2019-10-26 MED ORDER — MIDAZOLAM HCL 2 MG/2ML IJ SOLN
1.0000 mg | INTRAMUSCULAR | Status: DC | PRN
  Administered 2019-10-26 (×6): 2 mg via INTRAVENOUS
  Filled 2019-10-26 (×5): qty 2

## 2019-10-26 MED ORDER — CHLORHEXIDINE GLUCONATE CLOTH 2 % EX PADS
6.0000 | MEDICATED_PAD | Freq: Every day | CUTANEOUS | Status: DC
  Administered 2019-10-26 – 2019-10-27 (×2): 6 via TOPICAL

## 2019-10-26 MED ORDER — SODIUM CHLORIDE 0.9 % IV SOLN
250.0000 mL | INTRAVENOUS | Status: DC
  Administered 2019-10-26: 250 mL via INTRAVENOUS

## 2019-10-26 MED ORDER — SODIUM CHLORIDE 0.9 % IV SOLN
750.0000 mg | Freq: Two times a day (BID) | INTRAVENOUS | Status: DC
  Administered 2019-10-26 – 2019-10-28 (×5): 750 mg via INTRAVENOUS
  Filled 2019-10-26 (×6): qty 7.5

## 2019-10-26 MED ORDER — SODIUM CHLORIDE 0.9 % IV SOLN: INTRAVENOUS | Status: DC

## 2019-10-26 MED ORDER — ACETAMINOPHEN 160 MG/5ML PO SOLN
650.0000 mg | ORAL | Status: AC | PRN
  Administered 2019-10-26: 650 mg
  Filled 2019-10-26: qty 20.3

## 2019-10-26 MED ORDER — SODIUM CHLORIDE 0.9 % IV SOLN: INTRAVENOUS | Status: DC | PRN

## 2019-10-26 MED ORDER — INSULIN REGULAR(HUMAN) IN NACL 100-0.9 UT/100ML-% IV SOLN
INTRAVENOUS | Status: DC
  Administered 2019-10-26: 1.7 [IU]/h via INTRAVENOUS
  Administered 2019-10-26: 6 [IU]/h via INTRAVENOUS
  Filled 2019-10-26 (×2): qty 100

## 2019-10-26 MED ORDER — CHLORHEXIDINE GLUCONATE 0.12% ORAL RINSE (MEDLINE KIT)
15.0000 mL | Freq: Two times a day (BID) | OROMUCOSAL | Status: DC
  Administered 2019-10-26 – 2019-10-27 (×4): 15 mL via OROMUCOSAL

## 2019-10-26 MED ORDER — PROSOURCE TF PO LIQD
90.0000 mL | Freq: Two times a day (BID) | ORAL | Status: DC
  Administered 2019-10-26 – 2019-10-27 (×3): 90 mL
  Filled 2019-10-26 (×3): qty 90

## 2019-10-26 MED ORDER — ASPIRIN 325 MG PO TABS
325.0000 mg | ORAL_TABLET | Freq: Every day | ORAL | Status: DC
  Administered 2019-10-27: 325 mg via NASOGASTRIC
  Filled 2019-10-26: qty 1

## 2019-10-26 MED ORDER — PROPOFOL 1000 MG/100ML IV EMUL
5.0000 ug/kg/min | INTRAVENOUS | Status: DC
  Administered 2019-10-26: 10 ug/kg/min via INTRAVENOUS
  Administered 2019-10-26: 20 ug/kg/min via INTRAVENOUS
  Filled 2019-10-26 (×2): qty 100

## 2019-10-26 MED ORDER — PANTOPRAZOLE SODIUM 40 MG IV SOLR
40.0000 mg | Freq: Every day | INTRAVENOUS | Status: DC
  Administered 2019-10-26 – 2019-10-27 (×3): 40 mg via INTRAVENOUS
  Filled 2019-10-26 (×3): qty 40

## 2019-10-26 MED ORDER — ASPIRIN 300 MG RE SUPP
300.0000 mg | RECTAL | Status: AC
  Administered 2019-10-26: 300 mg via RECTAL
  Filled 2019-10-26: qty 1

## 2019-10-26 MED ORDER — SODIUM CHLORIDE 0.9% FLUSH: 10.0000 mL | INTRAVENOUS | Status: DC | PRN

## 2019-10-26 MED ORDER — ORAL CARE MOUTH RINSE
15.0000 mL | OROMUCOSAL | Status: DC
  Administered 2019-10-26 – 2019-10-28 (×23): 15 mL via OROMUCOSAL

## 2019-10-26 MED ORDER — NOREPINEPHRINE 4 MG/250ML-% IV SOLN: 2.0000 ug/min | INTRAVENOUS | Status: DC

## 2019-10-26 MED ORDER — MIDAZOLAM HCL 2 MG/2ML IJ SOLN: 2.0000 mg | INTRAMUSCULAR | Status: DC | PRN

## 2019-10-26 MED ORDER — SODIUM CHLORIDE 0.9% FLUSH
10.0000 mL | Freq: Two times a day (BID) | INTRAVENOUS | Status: DC
  Administered 2019-10-26 – 2019-10-27 (×3): 10 mL

## 2019-10-26 MED ORDER — POTASSIUM CHLORIDE 20 MEQ/15ML (10%) PO SOLN
40.0000 meq | Freq: Once | ORAL | Status: AC
  Administered 2019-10-26: 40 meq
  Filled 2019-10-26: qty 30

## 2019-10-26 MED ORDER — HEPARIN SODIUM (PORCINE) 5000 UNIT/ML IJ SOLN
5000.0000 [IU] | Freq: Three times a day (TID) | INTRAMUSCULAR | Status: DC
  Administered 2019-10-26 – 2019-10-28 (×6): 5000 [IU] via SUBCUTANEOUS
  Filled 2019-10-26 (×5): qty 1

## 2019-10-26 MED ORDER — CHLORHEXIDINE GLUCONATE 0.12% ORAL RINSE (MEDLINE KIT): 15.0000 mL | Freq: Two times a day (BID) | OROMUCOSAL | Status: DC

## 2019-10-26 MED ORDER — DEXTROSE 50 % IV SOLN: 0.0000 mL | INTRAVENOUS | Status: DC | PRN

## 2019-10-26 NOTE — Progress Notes (Signed)
Peripherally Inserted Central Catheter Placement  The IV Nurse has discussed with the patient and/or persons authorized to consent for the patient, the purpose of this procedure and the potential benefits and risks involved with this procedure.  The benefits include less needle sticks, lab draws from the catheter, and the patient may be discharged home with the catheter. Risks include, but not limited to, infection, bleeding, blood clot (thrombus formation), and puncture of an artery; nerve damage and irregular heartbeat and possibility to perform a PICC exchange if needed/ordered by physician.  Alternatives to this procedure were also discussed.  Bard Power PICC patient education guide, fact sheet on infection prevention and patient information card has been provided to patient /or left at bedside.  Telephone consent obtained from husband due to altered mental status.  PICC Placement Documentation  PICC Triple Lumen 10/26/19 PICC Right Brachial 39 cm 0 cm (Active)  Indication for Insertion or Continuance of Line Vasoactive infusions;Prolonged intravenous therapies 10/26/19 2229  Exposed Catheter (cm) 0 cm 10/26/19 2229  Site Assessment Clean;Dry;Intact 10/26/19 2229  Lumen #1 Status Flushed;Saline locked;Blood return noted 10/26/19 2229  Lumen #2 Status Flushed;Saline locked;Blood return noted 10/26/19 2229  Lumen #3 Status Flushed;Saline locked;Blood return noted 10/26/19 2229  Dressing Type Transparent 10/26/19 2229  Dressing Status Clean;Dry;Intact 10/26/19 2229  Antimicrobial disc in place? Yes 10/26/19 2229  Safety Lock Not Applicable 17/61/60 7371  Line Care Connections checked and tightened 10/26/19 2229  Line Adjustment (NICU/IV Team Only) No 10/26/19 2229  Dressing Intervention New dressing 10/26/19 2229  Dressing Change Due 11/02/19 10/26/19 2229       Ariyanah Aguado, Nicolette Bang 10/26/2019, 10:30 PM

## 2019-10-26 NOTE — ED Notes (Signed)
Spouse went to update additional family members that arrived.

## 2019-10-26 NOTE — Progress Notes (Signed)
eLink Physician-Brief Progress Note Patient Name: MADALYN LEGNER DOB: [birth year] MRN: [id number]   Date of Service  10/26/2019  HPI/Events of Note  Patient on D5 LR IV infusion at 75 mL/hour and tube feeds.   eICU Interventions  Plan: 1. D/C D5 LR IV infusion.      Intervention Category Major Interventions: Other:  Lysle Dingwall 10/26/2019, 9:50 PM

## 2019-10-26 NOTE — Progress Notes (Signed)
[facility] Progress Note Patient Name: [name] [name] DOB: [birth year] MRN: [id number]   Date of Service  10/26/2019  HPI/Events of Note  Patient with out of hospital cardiac arrest with significant pre-resuscitation downtime and post-resuscitation coma. She is  Receiving targeted temperature management although her overall prognosis is guarded. She has myoclonic jerking.  eICU Interventions  New Patient Evaluation completed, PRN versed ordered for myoclonic jerking, arterial line and cEEG ordered.        Alyssa Mancera U Chanele Douglas 10/26/2019, 1:22 AM

## 2019-10-26 NOTE — Procedures (Signed)
Patient Name: [name] [name]  MRN: [id number]  Epilepsy Attending: Lora Havens  Referring Physician/Provider: Dr Rhys Martini Date: 10/26/2019  Duration: 23.31 mins  Patient history: 69yo F s/p cardiac arrest. EEG to evaluate for seizure.  Level of alertness: Comatose  AEDs during EEG study: Versed, propofol, LEV   Technical aspects: This EEG study was done with scalp electrodes positioned according to the 10-20 International system of electrode placement. Electrical activity was acquired at a sampling rate of 500Hz  and reviewed with a high frequency filter of 70Hz  and a low frequency filter of 1Hz . EEG data were recorded continuously and digitally stored.   Description: Patient was noted to have frequent episodes of eye opening with axial jerking. Concomitant eeg showed generalized polyspikes consistent with myoclonic seizures. EEG also showed generalized background suppression.   Hyperventilation and photic stimulation were not performed.     ABNORMALITY - Myoclonic seizures, generalized - Background suppression, generalized  IMPRESSION: This study showed myoclonic seizures as well as profound diffuse encephalopathy, likely secondary to anoxic-hypoxic brain injury.  Halea Lieb Barbra Sarks

## 2019-10-26 NOTE — Progress Notes (Signed)
ABG drawn on a Rate of 24bpm, 8cc VT , and 60%.     Results for [name] [name], [name] [name] (MRN [id number]) as of 10/26/2019 00:35  Ref. Range 10/26/2019 00:33  Sample type Unknown ARTERIAL  pH, Arterial Latest Ref Range: 7.35 - 7.45  7.319 (L)  pCO2 arterial Latest Ref Range: 32 - 48 mmHg 46.5  pO2, Arterial Latest Ref Range: 83 - 108 mmHg 90  TCO2 Latest Ref Range: 22 - 32 mmol/L 25  Acid-base deficit Latest Ref Range: 0.0 - 2.0 mmol/L 2.0  Bicarbonate Latest Ref Range: 20.0 - 28.0 mmol/L 23.9  O2 Saturation Latest Units: % 96.0  Patient temperature Unknown 98.6 F

## 2019-10-26 NOTE — Care Plan (Signed)
LTM EEG reviewed till 1330.  Continues to show myoclonic seizures.  Please review final report for details.

## 2019-10-26 NOTE — Progress Notes (Signed)
Telephone consent obtained from husband for PICC.

## 2019-10-26 NOTE — Progress Notes (Signed)
[place] Progress Note Patient Name: [name] [name] DOB: [birth year] MRN: [id number]   Date of Service  10/26/2019  HPI/Events of Note  Temperature 100.4 despite arctic sun, patient continues to have myoclonic jerking despite Versed + Keppra.  eICU Interventions  Will start low dose propofol. Will order PRN Tylenol in addition to cooling blanket.        Kerry Kass Keanen Dohse 10/26/2019, 6:23 AM

## 2019-10-26 NOTE — Procedures (Signed)
Arterial Catheter Insertion Procedure Note  [name] [name]  [id number]  Sep 12, 1950  Date:10/26/19  Time:1:44 AM    Provider Performing: Levora Dredge    Procedure: Insertion of Arterial Line [phone]) without US guidance  Indication(s) Blood pressure monitoring and/or need for frequent ABGs  Consent Unable to obtain consent due to emergent nature of procedure.  Anesthesia None   Time Out Verified patient identification, verified procedure, site/side was marked, verified correct patient position, special equipment/implants available, medications/allergies/relevant history reviewed, required imaging and test results available.   Sterile Technique Maximal sterile technique including full sterile barrier drape, hand hygiene, sterile gown, sterile gloves, mask, hair covering, sterile ultrasound probe cover (if used).   Procedure Description Area of catheter insertion was cleaned with chlorhexidine and draped in sterile fashion. Without real-time ultrasound guidance an arterial catheter was placed into the left radial artery.  Appropriate arterial tracings confirmed on monitor.     Complications/Tolerance None; patient tolerated the procedure well.   EBL Minimal   Specimen(s) None

## 2019-10-26 NOTE — TOC Initial Note (Signed)
Transition of Care [facility]) - Initial/Assessment Note    Patient Details  Name: [name] [name] MRN: [id number] Date of Birth: 13-Jun-1950  Transition of Care [facility]) CM/SW Contact:    Verdell Carmine, RN Phone Number: 10/26/2019, 5:31 PM  Clinical Narrative:                 Living at home with husband sick for a couple of weeks, nausea vomiting. Was in bathroom, vomited, arrested fell between tuba nd toilet. Husband pulled patient out and  began CPR. EMS arrived, patient in VFIB. Shocked began protocol ROSC over 20 minutes. Patient with myclonus. TTM36 degrees. will re-evaluate in 72 hours for any neurological function. Cm will follow for any needs.  Expected Discharge Plan:  [facility] death and [place])     Patient Goals and CMS Choice        Expected Discharge Plan and Services Expected Discharge Plan:  [facility] death and [place] measures)       Living arrangements for the past 2 months: Single Family Home                                      Prior Living Arrangements/Services Living arrangements for the past 2 months: Single Family Home Lives with:: Spouse Patient language and need for interpreter reviewed:: Yes        Need for Family Participation in Patient Care: Yes (Comment) Care giver support system in place?: Yes (comment)   Criminal Activity/Legal Involvement Pertinent to Current Situation/Hospitalization: No - Comment as needed  Activities of Daily Living      Permission Sought/Granted                  Emotional Assessment   Attitude/Demeanor/Rapport: Intubated (Following Commands or Not Following Commands) Affect (typically observed): Unable to Assess Orientation: :  (unresponsive)   Psych Involvement: No (comment)  Admission diagnosis:  Cardiac arrest ([place]) [I46.9] Acute hypoxemic respiratory failure ([place]) [J96.01] Cardiac arrest with ventricular fibrillation ([place]) [I46.9, I49.01] Patient Active Problem List    Diagnosis Date Noted  . Cardiac arrest ([place]) 10/26/2019  . Non-intractable vomiting 10/22/2019  . Diarrhea 10/22/2019  . Weakness 10/22/2019  . History of abdominal pain 10/22/2019  . Secondary hypercoagulable state ([place]) 05/14/2019  . Persistent atrial fibrillation ([place])   . Female proctocele without uterine prolapse 10/03/2018  . Body mass index (BMI) 40.0-44.9, adult 10/03/2018  . Bradycardia 04/12/2018  . Adrenal adenoma, right 03/26/2018  . Cataracts, bilateral 03/07/2018  . Aortic stenosis, mild 03/07/2018  . Fatty liver 03/07/2018  . Kidney cysts 03/07/2018  . DDD (degenerative disc disease), lumbar 03/07/2018  . Hypothyroidism 03/07/2018  . Dizziness 10/27/2017  . Right groin pain 05/13/2017  . Do not resuscitate discussion 01/19/2017  . Acute upper respiratory infection 09/14/2016  . Atrial fibrillation with RVR ([place]) 09/14/2016  . Allergic conjunctivitis 07/28/2016  . Anemia   . Upper airway cough syndrome 12/25/2015  . Chronic pansinusitis 09/25/2015  . Recurrent sinus infections 08/30/2015  . Encounter for pre-operative cardiovascular clearance 05/20/2015  . Morbid obesity ([place]) 05/20/2015  . Atypical lobular hyperplasia of right breast 05/09/2015  . Chronic diastolic CHF (congestive heart failure) ([place]) 12/04/2014  . Acute on chronic diastolic CHF (congestive heart failure), NYHA class 1 ([place]) 11/11/2014  . Acute sinus infection 09/03/2014  . Rib pain on right side 04/11/2014  . Paresthesias in left hand 03/04/2014  .  Bilateral knee pain 02/13/2014  . Nausea alone 07/10/2013  . Encounter for therapeutic drug monitoring 02/09/2013  . COPD exacerbation ([place]) 01/17/2013  . Chest pain 04/19/2012  . Suicide gesture ([place]) 03/30/2012  . Major depressive disorder, single episode, unspecified 03/30/2012  . Tremor due to multiple drugs 11/11/2011  . Diplopia 11/11/2011  . Allergic rhinitis 09/14/2011  . Right leg weakness 04/14/2011  . Long term current use of  anticoagulant therapy 04/22/2010  . Encounter for well adult exam with abnormal findings 04/15/2010  . Chronic rhinitis 04/15/2010  . PREMATURE VENTRICULAR CONTRACTIONS 10/23/2008  . Atrial flutter ([place]) 07/01/2008  . Cough 06/20/2008  . Vitamin D deficiency 10/27/2007  . HLD (hyperlipidemia) 06/19/2007  . Anxiety state 06/19/2007  . PAF (paroxysmal atrial fibrillation) ([place]) 06/19/2007  . PULMONARY EMBOLISM, HX OF 06/19/2007  . Edema 03/20/2007  . Chronic respiratory failure with hypoxia and hypercapnia ([place]) 01/13/2007  . Depression 08/06/2006  . COPD GOLD II 08/06/2006  . Morbid obesity due to excess calories ([place]) 07/29/2006  . Essential hypertension 07/29/2006  . OSTEOPOROSIS 07/29/2006  . DEEP VENOUS THROMBOPHLEBITIS, HX OF 07/29/2006   PCP:  McLean-Scocuzza, Nino Glow, MD Pharmacy:   [place], [address] [place] [address] Phone: [phone] Fax: [phone]  [place], [address], [address], [address] [place] [address] Phone: [phone] Fax: [phone]     Social Determinants of Health (SDOH) Interventions    Readmission Risk Interventions No flowsheet data found.

## 2019-10-26 NOTE — Progress Notes (Signed)
EEG complete - results pending 

## 2019-10-26 NOTE — Progress Notes (Signed)
[place] Progress Note Patient Name: [name] [name] DOB: [birth year] MRN: [id number]   Date of Service  10/26/2019  HPI/Events of Note  Troponin bump to 280 following cardiac arrest and CPR,against the background of possible acute coronary syndrome.  eICU Interventions  Cycle Troponin, a.m. Echo to check for RWMA. If Troponin suggests ACS  Will consider starting Heparin infusion per ACS pathway. Begin Aspirin via NG tube.        Kerry Kass Mikaelah Trostle 10/26/2019, 2:53 AM

## 2019-10-26 NOTE — Progress Notes (Signed)
TTM paused for 40 minutes to go to CT. Will communicate to Night RN to add that to her therapy time.

## 2019-10-26 NOTE — Progress Notes (Signed)
Pt transported to and from CT scan on the ventilator without incident. 

## 2019-10-26 NOTE — Progress Notes (Signed)
eLink Physician-Brief Progress Note Patient Name: SYRETTA KOCHEL DOB: [birth year] MRN: [id number]   Date of Service  10/26/2019  HPI/Events of Note  Blood sugar < 250 mg / dl on an insulin infusion.  eICU Interventions  D 5 % LR infusion started at 75 ml / hour.        Kerry Kass Odalys Win 10/26/2019, 6:56 AM

## 2019-10-26 NOTE — Progress Notes (Signed)
NAME:  [name] [name], MRN:  [id number], DOB:  11-27-1950, LOS: 0 ADMISSION DATE:  10/16/2019,  , CHIEF COMPLAINT: Cardiac arrest  Brief History   69 year old white female that presented status post VF arrest Total estimated time to ROSC: 35 minutes   Past Medical History  COPD Atrial fibrillation Myocardial infarction Hypothyroidism Hypertension Hyperlipidemia Morbid obesity DVT Pulmonary embolism Breast cancer Asthma Anxiety/depression Anemia   Procedures:  Intubated in the emergency room  Significant Diagnostic Tests:  EEG 10/15 myoclonic seizures and anoxic injury   Micro Data:  Covid negative Influenza negative Subjective    Sedated and unresponsive  Objective   Blood pressure (Abnormal) 152/86, pulse 85, temperature (Abnormal) 94.6 F (34.8 C), resp. rate (Abnormal) 24, height 5\' 5"  (1.651 m), weight 115.7 kg, SpO2 96 %.    Vent Mode: PRVC FiO2 (%):  [40 %-100 %] 40 % Set Rate:  [24 bmp] 24 bmp Vt Set:  [480 mL] 480 mL PEEP:  [5 cmH20] 5 cmH20 Plateau Pressure:  [14 cmH20-23 cmH20] 14 cmH20   Intake/Output Summary (Last 24 hours) at 10/26/2019 1153 Last data filed at 10/26/2019 1100 Gross per 24 hour  Intake 1750.69 ml  Output 600 ml  Net 1150.69 ml   Filed Weights   11/02/2019 2343 10/26/19 0200  Weight: 106.6 kg 115.7 kg    Examination: General this is a 69 year old white female who is unresponsive and pressor dependent  HENT NCAT orally intubated. MMM Pulm diffuse rhonchi equal bilaterally Card RRR abd obese  Neuro unresponsive. Has intermittent bursts of violent myoclonic tremors.  Ext warm and dry  gu cl yellow    Assessment & Plan:    Out of hospital VF arrest-ROSC w/ new systolic CM w/ EF 03-21% And cardiogenic shock Plan Change add norepi and wean epi Will cont peripheral pressor protocol for now as seems like invasive interventions would be in-appropriate is there is indeed sig neurological insult Formal ECHO Cont tele   VT asa and lipitor Holding Tikosyn Holding metoprolol given shock state Priority currently is to support hemodynamic status while we investigate the degree of neurological injury  Post-Anoxic encephalopathy c/b myoclonic seizures (per EEG 10/15) Plan Cont LTM CT brain for now. If non-diagnostic can do MRI brain if we can stabilize her further  Cont Keppra and add depakote Avoid fever  Cont propofol    Acute respiratory failure s/p cardiac arrest w/ bilateral and diffuse pulmonary infiltrates. Prob mix of aspiration and pulmonary edema Plan Cont full vent support.  PAD protocol RASS goal -1 VAP bundle  Day 1 vanc and cefepime Send sputum culture  Trend procalcitonin and BNP Holding lasix d/t shock state   Atrial fibrillation Plan Rate control Holding DOAC for now  Consider IV heparin pending head CT  AKI w/ metabolic acidosis Plan Cont IVFs Repeat abg and chemistry today & in am  Strict I&O Renal dose meds  Intermittent fluid and electrolyte imbalance Plan Trend and replace as indicated   Chronic anemia w/ out evidence of bleeding Plan Trend cbc Transfuse for hgb < 7  DM w/ hyperglycemia Plan Cont IV insulin protocol   Best practice:  Diet: N.p.o. Pain/Anxiety/Delirium protocol (if indicated): Currently off but ordered if necessary VAP protocol (if indicated): Initiated DVT prophylaxis: Eliquis GI prophylaxis: Protonix Glucose control: Insulin sliding scale Mobility: Bedrest Code Status: DNR Family Communication: Husband at bedside Disposition: Admit to ICU  Critical care time: 45 minutes   Erick Colace ACNP-BC [place] Pager #  [phone] OR # [phone] if no answer

## 2019-10-26 NOTE — Progress Notes (Signed)
Patient having myoclonic jerking upon arrival to unit. Called MD to verify starting targeted temperature management. Per MD, start targeted temperature management. Called on-call EEG tech to complete stat EEG. Notified that it will not be completed until the morning.

## 2019-10-26 NOTE — Progress Notes (Addendum)
Initial Nutrition Assessment  DOCUMENTATION CODES:   Morbid obesity  INTERVENTION:   ADDENDUM (1510): RD consulted for assessment and tube feeding initiation and management. Will order the below recommendations.  Tube feeding recommendations: - Vital AF 1.2 @ 40 ml/hr (960 ml/day) via OG tube - ProSource TF 90 ml BID  Recommended tube feeding regimen would provide 1312 kcal, 116 grams of protein, and 779 ml of H2O.   Recommended tube feeding regimen and current propofol would provide 1679 total kcal (104% of needs).  NUTRITION DIAGNOSIS:   Inadequate oral intake related to inability to eat as evidenced by NPO status.  GOAL:   Provide needs based on ASPEN/SCCM guidelines  MONITOR:   Vent status, Labs, Weight trends, Skin, I & O's  REASON FOR ASSESSMENT:   Ventilator    ASSESSMENT:   69 year old female who presented to the ED on 10/14 after cardiac arrest. PMH of COPD, atrial fibrillation, MI, hypothyroidism, HTN, HLD, breast cancer, anxiety, depression, anemia. Pt intubated and started on TTM 36 degrees.   Per notes, pt had been having N/V/D for about 1 week PTA. Spoke with pt's husband at bedside who confirms this. He states that pt vomited or had diarrhea every time after eating something. She was eating much smaller portion sizes compared to what she usually did.  Pt's husband reports pt lost some weight during this time but that her weight would fluctuate up and down. Reviewed weight history in chart. Pt lost from 111.1 kg on 07/18/19 to 106.6 kg on 10/22/19. Current weight is 115.7 kg. Suspect current weight is falsely elevated related to edema.  OG tube in place, currently clamped. RD will leave TF recommendations.  Patient is currently intubated on ventilator support MV: 11.7 L/min Temp (24hrs), Avg:96.8 F (36 C), Min:94.6 F (34.8 C), Max:100.4 F (38 C) BP (a-line): 102/44 MAP (a-line): 61  Drips: Propofol: 13.9 ml/hr (provides 367 kcal daily from  lipid) D5 in LR: 75 ml/hr Epinephrine Fentanyl Insulin  Medications reviewed and include: protonix, IV abx  Labs reviewed: potassium 3.1, creatinine 1.44, ionized calcium 1.04, hemoglobin 9.5 CBG's: 209-378 x 12 hours  UOP: 600 ml x 12 hours  NUTRITION - FOCUSED PHYSICAL EXAM:    Most Recent Value  Orbital Region No depletion  Upper Arm Region No depletion  Thoracic and Lumbar Region No depletion  Buccal Region Unable to assess  Temple Region Unable to assess  Clavicle Bone Region No depletion  Clavicle and Acromion Bone Region No depletion  Scapular Bone Region Unable to assess  Dorsal Hand No depletion  Patellar Region No depletion  Anterior Thigh Region No depletion  Posterior Calf Region No depletion  Edema (RD Assessment) Mild  Hair Reviewed  Eyes Unable to assess  Mouth Unable to assess  Skin Reviewed  Nails Reviewed       Diet Order:   Diet Order    None      EDUCATION NEEDS:   No education needs have been identified at this time  Skin:  Skin Assessment: Skin Integrity Issues: Other: MASD to right breast and left breast  Last BM:  no documented BM  Height:   Ht Readings from Last 1 Encounters:  10/14/2019 5\' 5"  (1.651 m)    Weight:   Wt Readings from Last 1 Encounters:  10/26/19 115.7 kg    Ideal Body Weight:  56.8 kg  BMI:  Body mass index is 42.45 kg/m.  Estimated Nutritional Needs:   Kcal:  3903-0092  Protein:  114-134  grams  Fluid:  >/= 1.5 L    Gaynell Face, MS, RD, LDN Inpatient Clinical Dietitian Please see AMiON for contact information.

## 2019-10-26 NOTE — Progress Notes (Signed)
Assisted tele visit to patient with daughter.  Margaret Pyle, RN

## 2019-10-26 NOTE — Progress Notes (Signed)
[place] Progress Note Patient Name: MIRELA PARSLEY DOB: [birth year] MRN: [id number]   Date of Service  10/26/2019  HPI/Events of Note  Blood sugar 335 mg / dl, patient continuing to have myoclonic jerking despite PRN Versed.  eICU Interventions  Insulin infusion ordered, Keppra started.        Kerry Kass Zaevion Parke 10/26/2019, 3:58 AM

## 2019-10-26 NOTE — H&P (Signed)
NAME:  [name] [name], MRN:  [id number], DOB:  02-07-1950, LOS: 0 ADMISSION DATE:  10/19/2019,  , CHIEF COMPLAINT: Cardiac arrest  Brief History   69 year old white female that presented status post cardiac arrest  History of present illness   This is a 69 year old white female that presented to the emergency room from home. The patient had 2 weeks of nausea and vomiting. Patient been seen by video appointment with her GP. Over the past 48 hours patient has had increasing confusion with simple tasks. Patient's husband denies any gross focal motor neuro deficits. Tonight the patient was having an episode of vomiting in the bathroom her husband went in to assist her and as he got to her she fell unconscious to the floor and got stuck between the toilet and the bathtub. 911 was called. He struggled to get her out to a position to start CPR. There is approximately 15-minute downtime before EMS arrived. Patient was found to be in ventricular fibrillation. There is an ongoing 20-minute downtime with ACLS protocol. Patient was intubated on arrival to the emergency room. Patient's had no significant neurologic recovery since that event. She does have a significant history of COPD and is on home oxygen. She also has atrial fibrillation with prior ablations and was recently put on Tikosyn.  Past Medical History  COPD Atrial fibrillation Myocardial infarction Hypothyroidism Hypertension Hyperlipidemia Morbid obesity DVT Pulmonary embolism Breast cancer Asthma Anxiety/depression Anemia   Procedures:  Intubated in the emergency room  Significant Diagnostic Tests:    Micro Data:  Covid negative Influenza negative    Objective   Blood pressure 128/75, pulse 73, resp. rate (!) 24, height 5\' 5"  (1.651 m), weight 106.6 kg, SpO2 98 %.    Vent Mode: PRVC FiO2 (%):  [60 %-100 %] 60 % Set Rate:  [24 bmp] 24 bmp Vt Set:  [480 mL] 480 mL PEEP:  [5 cmH20] 5 cmH20 Plateau Pressure:  [23 cmH20]  23 cmH20   Intake/Output Summary (Last 24 hours) at 10/26/2019 0017 Last data filed at 10/26/2019 0014 Gross per 24 hour  Intake --  Output 600 ml  Net -600 ml   Filed Weights   10/15/2019 2343  Weight: 106.6 kg    Examination: General: No acute distress HENT: Orally intubated with endotracheal tube mucous membranes are moist cervical collar intact Lungs: Clear to auscultation bilaterally no wheezing rales or rhonchi noted Cardiovascular: Regular rate 1/6 systolic ejection murmur Abdomen: Soft, nondistended, positive bowel sounds, no rebound/rigidity/guarding the limited by neurologic status. Extremities: Distal pulse intact x4. Mild acrocyanosis of the feet, warm to touch no edema Neuro: Unconscious unresponsive pupils are fixed 2 mm no gag or cough GU: Foley catheter intact   Assessment & Plan:  Acute respiratory failure Out of hospital cardiac arrest-ROSC Anoxic encephalopathy Atrial fibrillation Acute renal insufficiency Chronic anemia Hypomagnesemia  Plan: Patient will be admitted to the intensive care unit for further work-up. Patient was started on targeted hypothermia protocol with a goal of 36 degrees. Standard ventilator protocol was initiated. Continue to augment blood pressure with epinephrine which is being weaned down. Currently the patient does not require sedation and is completely unresponsive. Would initiate for shivering or vent patient interaction. EEG is pending Monitor I's/O's. Bedside echocardiogram was performed. This demonstrates a dilated RV with a significant reduced RV function. LV ejection fraction estimated 30 to 35%. There is aneurysmal movement of the apical lateral wall. Small pericardial effusion seen. No tamponade. Foley catheter in place.  Discussed the  case at length with the patient's husband at bedside. The patient had a prior DNR and he wants to continue to honor that. We discussed at length both the definition of DNR, prognosis,  current medical therapy. He would like to wait 24 hours and see if she has any recovery. If her condition was to decline significantly we would readdress goals of therapy.  Best practice:  Diet: N.p.o. Pain/Anxiety/Delirium protocol (if indicated): Currently off but ordered if necessary VAP protocol (if indicated): Initiated DVT prophylaxis: Eliquis GI prophylaxis: Protonix Glucose control: Insulin sliding scale Mobility: Bedrest Code Status: DNR Family Communication: Husband at bedside Disposition: Admit to ICU  Labs   CBC: Recent Labs  Lab 10/18/2019 2227 11/07/2019 2229 10/26/2019 2251  WBC  --  22.4*  --   HGB 10.2*  9.2* 9.5* 9.2*  HCT 30.0*  27.0* 31.9* 27.0*  MCV  --  94.7  --   PLT  --  303  --     Basic Metabolic Panel: Recent Labs  Lab 10/21/2019 2227 10/27/2019 2229 11/04/2019 2251  NA 136  135 135 136  K 4.1  4.1 4.0 3.6  CL 95* 98  --   CO2  --  22  --   GLUCOSE 361* 368*  --   BUN 17 16  --   CREATININE 1.50* 1.58*  --   CALCIUM  --  7.5*  --   MG  --  1.9  --    GFR: Estimated Creatinine Clearance: 40.7 mL/min (A) (by C-G formula based on SCr of 1.58 mg/dL (H)). Recent Labs  Lab 11/07/2019 2229 10/16/2019 2237  WBC 22.4*  --   LATICACIDVEN  --  7.6*    Liver Function Tests: No results for input(s): AST, ALT, ALKPHOS, BILITOT, PROT, ALBUMIN in the last 168 hours. No results for input(s): LIPASE, AMYLASE in the last 168 hours. No results for input(s): AMMONIA in the last 168 hours.  ABG    Component Value Date/Time   PHART 7.291 (L) 10/27/2019 2251   PCO2ART 47.9 10/18/2019 2251   PO2ART 185 (H) 10/31/2019 2251   HCO3 23.4 10/23/2019 2251   TCO2 25 11/02/2019 2251   ACIDBASEDEF 3.0 (H) 10/16/2019 2251   O2SAT 100.0 11/01/2019 2251     Coagulation Profile: Recent Labs  Lab 10/22/2019 2229  INR 1.7*    Cardiac Enzymes: No results for input(s): CKTOTAL, CKMB, CKMBINDEX, TROPONINI in the last 168 hours.  HbA1C: Hemoglobin A1C  Date/Time  Value Ref Range Status  05/13/2017 11:34 AM 5.0  Final   Hgb A1c MFr Bld  Date/Time Value Ref Range Status  10/27/2017 10:54 AM 4.6 4.6 - 6.5 % Final    Comment:    Glycemic Control Guidelines for People with Diabetes:Non Diabetic:  <6%Goal of Therapy: <7%Additional Action Suggested:  >8%   11/08/2016 10:20 AM 4.7 4.6 - 6.5 % Final    Comment:    Glycemic Control Guidelines for People with Diabetes:Non Diabetic:  <6%Goal of Therapy: <7%Additional Action Suggested:  >8%     CBG: Recent Labs  Lab 10/30/2019 2207  GLUCAP 339*    Review of Systems:   Unable to be completed secondary to patient's neurologic condition  Past Medical History  She,  has a past medical history of Anemia, Anxiety, Arthritis, Asthma, Atrial fibrillation and flutter ([place]), Atypical lobular hyperplasia of right breast (05/09/2015), Breast CA ([place]), Chronic rhinitis, COPD (chronic obstructive pulmonary disease) ([place]), Depression, FRACTURE, RIB, RIGHT (06/18/2009), Glucose intolerance (impaired glucose tolerance), History of  DVT (deep vein thrombosis), cardiac catheterization, echocardiogram, HYPERLIPIDEMIA, Hypertension, Hypothyroidism, Morbid obesity ([place]), Osteoporosis, Peripheral vascular disease ([place]), Pneumonia, PONV (postoperative nausea and vomiting), PREMATURE VENTRICULAR CONTRACTIONS, PULMONARY EMBOLISM, HX OF (06/19/2007), Recurrent upper respiratory infection (URI), Rhabdomyolysis (07/29/2009), Tremor, and VITAMIN D DEFICIENCY.   Surgical History    Past Surgical History:  Procedure Laterality Date  . A-FLUTTER ABLATION N/A 10/08/2016   Procedure: A-FLUTTER ABLATION;  Surgeon: Evans Lance, MD;  Location: [facility];  Service: Cardiovascular;  Laterality: N/A;  . ABDOMINAL HYSTERECTOMY    . BREAST LUMPECTOMY WITH RADIOACTIVE SEED LOCALIZATION Right 06/05/2015   Procedure: RIGHT BREAST LUMPECTOMY WITH RADIOACTIVE SEED LOCALIZATION;  Surgeon: Fanny Skates, MD;  Location: [facility];  Service: General;   Laterality: Right;  . BREAST SURGERY Left    s/p mass removal  . CARDIOVERSION N/A 12/11/2014   Procedure: CARDIOVERSION;  Surgeon: Josue Hector, MD;  Location: [place];  Service: Cardiovascular;  Laterality: N/A;  . CARDIOVERSION N/A 09/15/2016   Procedure: CARDIOVERSION;  Surgeon: Jerline Pain, MD;  Location: [facility] ENDOSCOPY;  Service: Cardiovascular;  Laterality: N/A;  . CARDIOVERSION N/A 04/09/2019   Procedure: CARDIOVERSION;  Surgeon: Pixie Casino, MD;  Location: [facility] ENDOSCOPY;  Service: Cardiovascular;  Laterality: N/A;  . CARDIOVERSION N/A 06/14/2019   Procedure: CARDIOVERSION;  Surgeon: Lelon Perla, MD;  Location: [facility] ENDOSCOPY;  Service: Cardiovascular;  Laterality: N/A;  . COLONOSCOPY  10/22/2003, ?2006  . COLONOSCOPY WITH PROPOFOL N/A 01/15/2016   Procedure: COLONOSCOPY WITH PROPOFOL;  Surgeon: Gatha Mayer, MD;  Location: [facility] ENDOSCOPY;  Service: Endoscopy;  Laterality: N/A;  . ESOPHAGOGASTRODUODENOSCOPY (EGD) WITH PROPOFOL N/A 01/15/2016   Procedure: ESOPHAGOGASTRODUODENOSCOPY (EGD) WITH PROPOFOL;  Surgeon: Gatha Mayer, MD;  Location: [facility] ENDOSCOPY;  Service: Endoscopy;  Laterality: N/A;  . s/p left arm fracture with fall off chair    . skin graft to middle R finger  1975  . TEE WITHOUT CARDIOVERSION N/A 12/11/2014   Procedure: TRANSESOPHAGEAL ECHOCARDIOGRAM (TEE);  Surgeon: Josue Hector, MD;  Location: [facility] ENDOSCOPY;  Service: Cardiovascular;  Laterality: N/A;  . TONSILLECTOMY       Social History   reports that she quit smoking about 13 years ago. Her smoking use included cigarettes. She has a 35.00 pack-year smoking history. She has never used smokeless tobacco. She reports that she does not drink alcohol and does not use drugs.   Family History   Her family history includes Asthma in her brother and son; Esophageal cancer in her brother; Heart disease in her mother; Liver disease in her mother. There is no history of Colon cancer, Stomach cancer, Pancreatic  cancer, Inflammatory bowel disease, Allergic rhinitis, Eczema, or Urticaria.   Allergies Allergies  Allergen Reactions  . Duloxetine Other (See Comments)    REACTION: rhabdomyolysis  . Penicillins Other (See Comments)    SYNCOPE Has patient had a PCN reaction causing immediate rash, facial/tongue/throat swelling, SOB or lightheadedness with hypotension: Yes Has patient had a PCN reaction causing severe rash involving mucus membranes or skin necrosis: No Has patient had a PCN reaction that required hospitalization No Has patient had a PCN reaction occurring within the last 10 years: No If all of the above answers are "NO", then may proceed with Cephalosporin use.   . Latex Rash     Home Medications  Prior to Admission medications   Medication Sig Start Date End Date Taking? Authorizing Provider  albuterol (VENTOLIN HFA) 108 (90 Base) MCG/ACT inhaler Inhale 2 puffs into the  lungs as needed for wheezing or shortness of breath.  12/29/18   [provider]  amLODipine (NORVASC) 5 MG tablet amlodipine 5 mg tablet    [provider]  apixaban (ELIQUIS) 5 MG TABS tablet Take 1 tablet (5 mg total) by mouth 2 (two) times daily. 08/03/19   Lelon Perla, MD  atorvastatin (LIPITOR) 10 MG tablet Take 1 tablet (10 mg total) by mouth daily at 6 PM. 08/31/18   McLean-Scocuzza, Nino Glow, MD  azelastine (ASTELIN) 0.1 % nasal spray Place 2 sprays into both nostrils at bedtime as needed for rhinitis. Use in each nostril as directed 03/12/18   McLean-Scocuzza, Nino Glow, MD  Calcium Carbonate-Vitamin D 600-400 MG-UNIT per tablet Take 1 tablet by mouth daily.     [provider]  dofetilide (TIKOSYN) 500 MCG capsule Take 1 capsule (500 mcg total) by mouth 2 (two) times daily. 06/15/19   Baldwin Jamaica, PA-C  famotidine (PEPCID) 20 MG tablet Take 1 tablet (20 mg total) by mouth daily. At bedtime 03/04/19   McLean-Scocuzza, Nino Glow, MD  ferrous sulfate 325 (65 FE) MG tablet Take 325 mg  by mouth 2 (two) times daily with a meal.    [provider]  FLUoxetine (PROZAC) 40 MG capsule Take 1 capsule (40 mg total) by mouth daily. 06/01/19   McLean-Scocuzza, Nino Glow, MD  Fluticasone-Umeclidin-Vilant (TRELEGY ELLIPTA) 100-62.5-25 MCG/INH AEPB Inhale 1 puff into the lungs daily. 10/12/19   Icard, Octavio Graves, DO  furosemide (LASIX) 80 MG tablet TAKE 1 TABLET BY MOUTH IN  THE MORNING AND 1 TABLET IN THE AFTERNOON 02/05/19   Lelon Perla, MD  Glucosamine-Chondroitin (COSAMIN DS PO) Take 1 tablet by mouth daily.    [provider]  KLOR-CON M20 20 MEQ tablet TAKE 1 TABLET BY MOUTH  TWICE DAILY 03/27/19   Lelon Perla, MD  levalbuterol Penne Lash) 1.25 MG/3ML nebulizer solution Take 1.25 mg by nebulization every 4 (four) hours. 09/28/16   Tanda Rockers, MD  levocetirizine (XYZAL) 5 MG tablet Take 1 tablet (5 mg total) by mouth at bedtime as needed for allergies. 04/24/19   McLean-Scocuzza, Nino Glow, MD  metoprolol tartrate (LOPRESSOR) 25 MG tablet Take 25 mg by mouth.    [provider]  olopatadine (PATANOL) 0.1 % ophthalmic solution Place 1 drop into both eyes 2 (two) times daily. 04/12/18   McLean-Scocuzza, Nino Glow, MD  OXYGEN Inhale 2.5 L/min into the lungs continuous. And 4 with exertion    [provider]  pantoprazole (PROTONIX) 40 MG tablet TAKE 1 TABLET BY MOUTH  DAILY, 30 TO 60 MINUTES  BEFORE FIRST MEAL OF THE  DAY 07/30/19   Garner Nash, DO     Critical care time:1mins

## 2019-10-26 NOTE — Progress Notes (Signed)
[facility] Progress Note Patient Name: [name] [name] DOB: [birth year] MRN: [id number]   Date of Service  10/26/2019  HPI/Events of Note  Hypokalemia - K+ = 3.0 and Creatinine = 2.07.   eICU Interventions  Will replace K+.      Intervention Category Major Interventions: Electrolyte abnormality - evaluation and management  Terelle Dobler Eugene 10/26/2019, 9:41 PM

## 2019-10-27 DIAGNOSIS — G40411 Other generalized epilepsy and epileptic syndromes, intractable, with status epilepticus: Secondary | ICD-10-CM

## 2019-10-27 DIAGNOSIS — I469 Cardiac arrest, cause unspecified: Secondary | ICD-10-CM | POA: Diagnosis not present

## 2019-10-27 LAB — BASIC METABOLIC PANEL
Anion gap: 13 (ref 5–15)
Anion gap: 11 (ref 5–15)
BUN: 27 mg/dL — ABNORMAL HIGH (ref 8–23)
BUN: 29 mg/dL — ABNORMAL HIGH (ref 8–23)
CO2: 19 mmol/L — ABNORMAL LOW (ref 22–32)
CO2: 20 mmol/L — ABNORMAL LOW (ref 22–32)
Calcium: 7.7 mg/dL — ABNORMAL LOW (ref 8.9–10.3)
Calcium: 7.7 mg/dL — ABNORMAL LOW (ref 8.9–10.3)
Chloride: 108 mmol/L (ref 98–111)
Chloride: 108 mmol/L (ref 98–111)
Creatinine, Ser: 2.3 mg/dL — ABNORMAL HIGH (ref 0.44–1.00)
Creatinine, Ser: 2.62 mg/dL — ABNORMAL HIGH (ref 0.44–1.00)
GFR, Estimated: 21 mL/min — ABNORMAL LOW (ref >60)
GFR, Estimated: 18 mL/min — ABNORMAL LOW (ref >60)
Glucose, Bld: 142 mg/dL — ABNORMAL HIGH (ref 70–99)
Glucose, Bld: 200 mg/dL — ABNORMAL HIGH (ref 70–99)
Potassium: 3.2 mmol/L — ABNORMAL LOW (ref 3.5–5.1)
Potassium: 4 mmol/L (ref 3.5–5.1)
Sodium: 140 mmol/L (ref 135–145)
Sodium: 139 mmol/L (ref 135–145)

## 2019-10-27 LAB — GLUCOSE, CAPILLARY
Glucose-Capillary: 120 mg/dL — ABNORMAL HIGH (ref 70–99)
Glucose-Capillary: 136 mg/dL — ABNORMAL HIGH (ref 70–99)
Glucose-Capillary: 138 mg/dL — ABNORMAL HIGH (ref 70–99)
Glucose-Capillary: 140 mg/dL — ABNORMAL HIGH (ref 70–99)
Glucose-Capillary: 140 mg/dL — ABNORMAL HIGH (ref 70–99)
Glucose-Capillary: 144 mg/dL — ABNORMAL HIGH (ref 70–99)
Glucose-Capillary: 151 mg/dL — ABNORMAL HIGH (ref 70–99)
Glucose-Capillary: 155 mg/dL — ABNORMAL HIGH (ref 70–99)
Glucose-Capillary: 168 mg/dL — ABNORMAL HIGH (ref 70–99)

## 2019-10-27 LAB — CBC
HCT: 28.1 % — ABNORMAL LOW (ref 36.0–46.0)
Hemoglobin: 9.1 g/dL — ABNORMAL LOW (ref 12.0–15.0)
MCH: 28.3 pg (ref 26.0–34.0)
MCHC: 32.4 g/dL (ref 30.0–36.0)
MCV: 87.3 fL (ref 80.0–100.0)
Platelets: 289 10*3/uL (ref 150–400)
RBC: 3.22 MIL/uL — ABNORMAL LOW (ref 3.87–5.11)
RDW: 15.7 % — ABNORMAL HIGH (ref 11.5–15.5)
WBC: 23.5 10*3/uL — ABNORMAL HIGH (ref 4.0–10.5)
nRBC: 0 % (ref 0.0–0.2)

## 2019-10-27 LAB — PROCALCITONIN: Procalcitonin: 20.36 ng/mL

## 2019-10-27 LAB — MAGNESIUM: Magnesium: 1.9 mg/dL (ref 1.7–2.4)

## 2019-10-27 LAB — PHOSPHORUS: Phosphorus: 6.1 mg/dL — ABNORMAL HIGH (ref 2.5–4.6)

## 2019-10-27 MED ORDER — MAGNESIUM SULFATE IN D5W 1-5 GM/100ML-% IV SOLN
1.0000 g | Freq: Once | INTRAVENOUS | Status: AC
  Administered 2019-10-27: 1 g via INTRAVENOUS
  Filled 2019-10-27: qty 100

## 2019-10-27 MED ORDER — INFLUENZA VAC A&B SA ADJ QUAD 0.5 ML IM PRSY
0.5000 mL | PREFILLED_SYRINGE | INTRAMUSCULAR | Status: DC
  Filled 2019-10-27: qty 0.5

## 2019-10-27 MED ORDER — AMIODARONE LOAD VIA INFUSION: 150.0000 mg | Freq: Once | INTRAVENOUS | Status: DC

## 2019-10-27 MED ORDER — INSULIN DETEMIR 100 UNIT/ML ~~LOC~~ SOLN
5.0000 [IU] | Freq: Two times a day (BID) | SUBCUTANEOUS | Status: DC
  Administered 2019-10-27 (×3): 5 [IU] via SUBCUTANEOUS
  Filled 2019-10-27 (×5): qty 0.05

## 2019-10-27 MED ORDER — AMIODARONE IV BOLUS ONLY 150 MG/100ML
150.0000 mg | Freq: Once | INTRAVENOUS | Status: AC
  Administered 2019-10-27: 150 mg via INTRAVENOUS

## 2019-10-27 MED ORDER — POTASSIUM CHLORIDE 10 MEQ/50ML IV SOLN: 10.0000 meq | INTRAVENOUS | Status: DC

## 2019-10-27 MED ORDER — DILTIAZEM LOAD VIA INFUSION: 10.0000 mg | Freq: Once | INTRAVENOUS | Status: DC

## 2019-10-27 MED ORDER — AMIODARONE HCL IN DEXTROSE 360-4.14 MG/200ML-% IV SOLN
60.0000 mg/h | INTRAVENOUS | Status: AC
  Administered 2019-10-27 – 2019-10-28 (×2): 60 mg/h via INTRAVENOUS
  Filled 2019-10-27 (×2): qty 200

## 2019-10-27 MED ORDER — INSULIN ASPART 100 UNIT/ML ~~LOC~~ SOLN
1.0000 [IU] | SUBCUTANEOUS | Status: DC
  Administered 2019-10-27 (×2): 2 [IU] via SUBCUTANEOUS
  Administered 2019-10-27 (×2): 1 [IU] via SUBCUTANEOUS
  Administered 2019-10-27 – 2019-10-28 (×2): 2 [IU] via SUBCUTANEOUS
  Administered 2019-10-28: 1 [IU] via SUBCUTANEOUS

## 2019-10-27 MED ORDER — POTASSIUM CHLORIDE 10 MEQ/50ML IV SOLN
10.0000 meq | INTRAVENOUS | Status: AC
  Administered 2019-10-27 (×2): 10 meq via INTRAVENOUS
  Filled 2019-10-27: qty 50

## 2019-10-27 MED ORDER — AMIODARONE HCL IN DEXTROSE 360-4.14 MG/200ML-% IV SOLN
30.0000 mg/h | INTRAVENOUS | Status: DC
  Administered 2019-10-28 (×2): 30 mg/h via INTRAVENOUS
  Filled 2019-10-27: qty 200

## 2019-10-27 MED ORDER — PHENYLEPHRINE CONCENTRATED 100MG/250ML (0.4 MG/ML) INFUSION SIMPLE
0.0000 ug/min | INTRAVENOUS | Status: DC
  Administered 2019-10-27 – 2019-10-28 (×2): 100 ug/min via INTRAVENOUS
  Filled 2019-10-27 (×3): qty 250

## 2019-10-27 MED ORDER — METOPROLOL TARTRATE 5 MG/5ML IV SOLN
INTRAVENOUS | Status: AC
  Filled 2019-10-27: qty 10

## 2019-10-27 MED ORDER — PHENYLEPHRINE HCL-NACL 10-0.9 MG/250ML-% IV SOLN
0.0000 ug/min | INTRAVENOUS | Status: DC
  Administered 2019-10-27 (×2): 100 ug/min via INTRAVENOUS
  Filled 2019-10-27: qty 250
  Filled 2019-10-27: qty 500

## 2019-10-27 MED ORDER — INSULIN ASPART 100 UNIT/ML ~~LOC~~ SOLN
1.0000 [IU] | SUBCUTANEOUS | Status: DC
  Administered 2019-10-27 – 2019-10-28 (×8): 1 [IU] via SUBCUTANEOUS

## 2019-10-27 MED ORDER — DEXTROSE 10 % IV SOLN: INTRAVENOUS | Status: DC | PRN

## 2019-10-27 NOTE — Progress Notes (Signed)
LTM EEG discontinued - no skin breakdown at unhook. [facility] notified 

## 2019-10-27 NOTE — Procedures (Addendum)
Patient Name: [name] [name]  MRN: [id number]  Epilepsy Attending: Lora Havens  Referring Physician/Provider: Dr Rhys Martini Duration: 10/26/2019 0906 to 10/27/2019 1104  Patient history: 69yo F s/p cardiac arrest. EEG to evaluate for seizure.  Level of alertness: Comatose  AEDs during EEG study: Versed, propofol, LEV, VPA   Technical aspects: This EEG study was done with scalp electrodes positioned according to the 10-20 International system of electrode placement. Electrical activity was acquired at a sampling rate of 500Hz  and reviewed with a high frequency filter of 70Hz  and a low frequency filter of 1Hz . EEG data were recorded continuously and digitally stored.   Description: Patient was noted to have frequent episodes of eye opening with axial jerking. Concomitant eeg showed generalized polyspikes consistent with myoclonic seizures.Gradually seizures became more and more frequent and were happening near continuously after around 730am on 10/27/2019.  EEG also showed/ /generalized background suppression.   Hyperventilation and photic stimulation were not performed.     ABNORMALITY - Myoclonic status epilepticus, generalized - Background suppression, generalized  IMPRESSION: This study initially showed myoclonic seizures which gradually worsened to myoclonic status epilepticus as well as profound diffuse encephalopathy, likely secondary to anoxic-hypoxic brain injury.  Dr Lake Bells was notified.  Dyanne Yorks Barbra Sarks

## 2019-10-27 NOTE — Progress Notes (Signed)
  Amiodarone Drug - Drug Interaction Consult Note  Recommendations: No significant drug-drug interactions noted at this time, no adjustments necessary     Amiodarone is metabolized by the cytochrome P450 system and therefore has the potential to cause many drug interactions. Amiodarone has an average plasma half-life of 50 days (range 20 to 100 days).   There is potential for drug interactions to occur several weeks or months after stopping treatment and the onset of drug interactions may be slow after initiating amiodarone.   []  Statins: Increased risk of myopathy. Simvastatin- restrict dose to 20mg  daily. Other statins: counsel patients to report any muscle pain or weakness immediately.  []  Anticoagulants: Amiodarone can increase anticoagulant effect. Consider warfarin dose reduction. Patients should be monitored closely and the dose of anticoagulant altered accordingly, remembering that amiodarone levels take several weeks to stabilize.  []  Antiepileptics: Amiodarone can increase plasma concentration of phenytoin, the dose should be reduced. Note that small changes in phenytoin dose can result in large changes in levels. Monitor patient and counsel on signs of toxicity.  []  Beta blockers: increased risk of bradycardia, AV block and myocardial depression. Sotalol - avoid concomitant use.  []   Calcium channel blockers (diltiazem and verapamil): increased risk of bradycardia, AV block and myocardial depression.  []   Cyclosporine: Amiodarone increases levels of cyclosporine. Reduced dose of cyclosporine is recommended.  []  Digoxin dose should be halved when amiodarone is started.  []  Diuretics: increased risk of cardiotoxicity if hypokalemia occurs.  []  Oral hypoglycemic agents (glyburide, glipizide, glimepiride): increased risk of hypoglycemia. Patient's glucose levels should be monitored closely when initiating amiodarone therapy.   []  Drugs that prolong the QT interval:  Torsades de  pointes risk may be increased with concurrent use - avoid if possible.  Monitor QTc, also keep magnesium/potassium WNL if concurrent therapy can't be avoided. Marland Kitchen Antibiotics: e.g. fluoroquinolones, erythromycin. . Antiarrhythmics: e.g. quinidine, procainamide, disopyramide, sotalol. . Antipsychotics: e.g. phenothiazines, haloperidol.  . Lithium, tricyclic antidepressants, and methadone. Thank Dawson Bills  10/27/2019 7:36 PM

## 2019-10-27 NOTE — Progress Notes (Signed)
[facility] Progress Note Patient Name: [name] [name] DOB: [birth year] MRN: [id number]   Date of Service  10/27/2019  HPI/Events of Note  Hyperglycemia - Blood glucose = 108 --> 108 --> 109 --> 120.  eICU Interventions  Will transition from insulin IV infusion to Levemir + sensitive Novolog SSI.     Intervention Category Major Interventions: Hyperglycemia - active titration of insulin therapy  Cataldo Cosgriff Eugene 10/27/2019, 1:20 AM

## 2019-10-27 NOTE — Progress Notes (Signed)
NAME:  [name] [name], MRN:  [id number], DOB:  1950/10/05, LOS: 1 ADMISSION DATE:  11/03/2019, CONSULTATION DATE:  10/14 REFERRING MD:  EDP, CHIEF COMPLAINT:  Post cardiac arrest   Brief History   69 y/o female found down at home, VF arrest. Had 15 minutes of time without CPR, then EMS initiated CPR for 20 minutes.  Admitted on 10/14  Past Medical History  COPD Atrial fibrillation Myocardial infarction Hypothyroidism Hypertension Hyperlipidemia Morbid obesity DVT Pulmonary embolism Breast cancer Asthma Anxiety/depression Anemia  Significant Hospital Events   10/14 admission  Consults:    Procedures:  10/14 ETT >   Significant Diagnostic Tests:  EEG 10/15 myoclonic seizures and anoxic injury   Micro Data:  10/14 SARS CoV-2/Flu>  negative  Antimicrobials:  10/14 cefepime  Interim history/subjective:  More myoclonus activity  Objective   Blood pressure (!) 152/86, pulse (!) 102, temperature 97.9 F (36.6 C), resp. rate (!) 21, height 5\' 5"  (1.651 m), weight 116.1 kg, SpO2 97 %.    Vent Mode: PRVC FiO2 (%):  [40 %] 40 % Set Rate:  [24 bmp] 24 bmp Vt Set:  [480 mL] 480 mL PEEP:  [5 cmH20] 5 cmH20 Plateau Pressure:  [12 cmH20-23 cmH20] 23 cmH20   Intake/Output Summary (Last 24 hours) at 10/27/2019 0744 Last data filed at 10/27/2019 0600 Gross per 24 hour  Intake 4298.95 ml  Output 835 ml  Net 3463.95 ml   Filed Weights   11/11/2019 2343 10/26/19 0200 10/27/19 0500  Weight: 106.6 kg 115.7 kg 116.1 kg    Examination: General:  In bed on vent HENT: NCAT ETT in place PULM: CTA B, vent supported breathing CV: RRR, no mgr GI: BS+, soft, nontender MSK: normal bulk and tone Neuro: myoclonic jerks are frequent, no corneal, no gag, no response to painful stimuli    Resolved Hospital Problem list     Assessment & Plan:  Out of hospital VF arrest New systolic heart failure Cardiogenic shock Wean off epinephrine infusion for MAP > 65  Anoxic brain  injury> severe Worsening myoclonus today Continue keppra/valproate Increase versed infusion to stop myoclonus  Acute respiratory failure with hypoxemia due to inability to protect airway Diffuse pulmonary infiltrates Full mechanical vent support VAP prevention Daily WUA/SBT  Atrial fibrillation Tele  AKI Continue tube feedings Not a dialysis candidate No more labs  Chronic anemia without evidence of bleeding Monitor for bleeding Transfuse PRBC for Hgb < 7 gm/dL  DM2 SSI   Best practice:  Diet: tube feeding Pain/Anxiety/Delirium protocol (if indicated): continue PAD protocol with versed and dilaudid VAP protocol (if indicated): yes DVT prophylaxis: sub q heparin GI prophylaxis: Pantoprazole for stress ulcer prophylaxis Glucose control: SSI Mobility: bed rest Code Status: DNR Family Communication: updated husband bedside, they plan to come on 10/17 to withdraw Disposition:   Labs   CBC: Recent Labs  Lab 10/21/2019 2229 11/03/2019 2251 10/26/19 0026 10/26/19 0033 10/26/19 0129 10/26/19 0505 10/27/19 0423  WBC 22.4*  --  23.4*  --  21.6*  --  23.5*  NEUTROABS  --   --  19.0*  --   --   --   --   HGB 9.5*   < > 9.9* 9.2* 10.2* 9.5* 9.1*  HCT 31.9*   < > 31.1* 27.0* 31.8* 28.0* 28.1*  MCV 94.7  --  93.1  --  90.6  --  87.3  PLT 303  --  320  --  287  --  289   < > =  values in this interval not displayed.    Basic Metabolic Panel: Recent Labs  Lab 10/31/2019 2229 10/24/2019 2251 10/26/19 0026 10/26/19 0033 10/26/19 0129 10/26/19 0505 10/26/19 1311 10/26/19 1711 10/26/19 2024 10/27/19 0423  NA 135   < > 133*   < > 136 137 136  --  138 140  K 4.0   < > 3.7   < > 3.5 3.1* 2.8*  --  3.0* 3.2*  CL 98   < > 100  --  102  --  105  --  105 108  CO2 22   < > 18*  --  19*  --  19*  --  19* 19*  GLUCOSE 368*   < > 346*  --  353*  --  301*  --  147* 142*  BUN 16   < > 17  --  17  --  22  --  25* 27*  CREATININE 1.58*   < > 1.44*  --  1.44*  --  1.85*  --  2.07*  2.30*  CALCIUM 7.5*   < > 7.2*  --  7.1*  --  7.5*  --  7.6* 7.7*  MG 1.9  --   --   --  1.9  --   --  1.8  --  1.9  PHOS  --   --   --   --  4.5  --   --  5.1*  --  6.1*   < > = values in this interval not displayed.   GFR: Estimated Creatinine Clearance: 29.4 mL/min (A) (by C-G formula based on SCr of 2.3 mg/dL (H)). Recent Labs  Lab 11/06/2019 2229 10/26/2019 2237 10/26/19 0017 10/26/19 0026 10/26/19 0129 10/26/19 1311 10/26/19 1529 10/27/19 0423  PROCALCITON  --   --   --   --   --  20.05  --  20.36  WBC 22.4*  --   --  23.4* 21.6*  --   --  23.5*  LATICACIDVEN  --  7.6* 4.6*  --   --  4.6* 5.1*  --     Liver Function Tests: Recent Labs  Lab 10/26/19 0017 10/26/19 1311  AST 426* 184*  ALT 199* 182*  ALKPHOS 81 77  BILITOT 1.9* 0.8  PROT 5.6* 6.0*  ALBUMIN 2.7* 2.8*   No results for input(s): LIPASE, AMYLASE in the last 168 hours. No results for input(s): AMMONIA in the last 168 hours.  ABG    Component Value Date/Time   PHART 7.261 (L) 10/26/2019 0505   PCO2ART 47.7 10/26/2019 0505   PO2ART 104 10/26/2019 0505   HCO3 21.7 10/26/2019 0505   TCO2 23 10/26/2019 0505   ACIDBASEDEF 5.0 (H) 10/26/2019 0505   O2SAT 97.0 10/26/2019 0505     Coagulation Profile: Recent Labs  Lab 10/14/2019 2229 10/26/19 0026 10/26/19 0850  INR 1.7* 1.7* 1.6*    Cardiac Enzymes: No results for input(s): CKTOTAL, CKMB, CKMBINDEX, TROPONINI in the last 168 hours.  HbA1C: Hemoglobin A1C  Date/Time Value Ref Range Status  05/13/2017 11:34 AM 5.0  Final   Hgb A1c MFr Bld  Date/Time Value Ref Range Status  10/27/2017 10:54 AM 4.6 4.6 - 6.5 % Final    Comment:    Glycemic Control Guidelines for People with Diabetes:Non Diabetic:  <6%Goal of Therapy: <7%Additional Action Suggested:  >8%   11/08/2016 10:20 AM 4.7 4.6 - 6.5 % Final    Comment:    Glycemic Control Guidelines for People with  Diabetes:Non Diabetic:  <6%Goal of Therapy: <7%Additional Action Suggested:  >8%      CBG: Recent Labs  Lab 10/27/19 0138 10/27/19 0231 10/27/19 0337 10/27/19 0422 10/27/19 0729  GLUCAP 140* 144* 140* 138* 136*     Critical care time: 40 minutes     Roselie Awkward, MD [facility] Pager: [phone] Cell: [phone] If no response, [phone]

## 2019-10-27 NOTE — Progress Notes (Signed)
[facility] Progress Note Patient Name: [name] [name] DOB: [birth year] MRN: [id number]   Date of Service  10/27/2019  HPI/Events of Note  K+ = 3.2, Mg++ - 1.9 and Creatinine = 2.3.   eICU Interventions  Will replace K+ and Mg++.      Intervention Category Major Interventions: Electrolyte abnormality - evaluation and management  Yonah Tangeman Eugene 10/27/2019, 5:48 AM

## 2019-10-27 NOTE — Progress Notes (Signed)
LTM maint complete - no skin breakdown under: Fp2 F4 F8 A2  

## 2019-10-28 LAB — PHOSPHORUS
Phosphorus: 5 mg/dL — ABNORMAL HIGH (ref 2.5–4.6)
Phosphorus: 5.2 mg/dL — ABNORMAL HIGH (ref 2.5–4.6)

## 2019-10-28 LAB — GLUCOSE, CAPILLARY
Glucose-Capillary: 137 mg/dL — ABNORMAL HIGH (ref 70–99)
Glucose-Capillary: 138 mg/dL — ABNORMAL HIGH (ref 70–99)
Glucose-Capillary: 144 mg/dL — ABNORMAL HIGH (ref 70–99)

## 2019-10-28 LAB — MAGNESIUM
Magnesium: 2.1 mg/dL (ref 1.7–2.4)
Magnesium: 2.2 mg/dL (ref 1.7–2.4)

## 2019-10-28 LAB — PROCALCITONIN: Procalcitonin: 18.13 ng/mL

## 2019-10-28 MED ORDER — GLYCOPYRROLATE 0.2 MG/ML IJ SOLN: 0.2000 mg | INTRAMUSCULAR | Status: DC | PRN

## 2019-10-28 MED ORDER — GLYCOPYRROLATE 1 MG PO TABS: 1.0000 mg | ORAL_TABLET | ORAL | Status: DC | PRN

## 2019-10-28 MED ORDER — DEXTROSE 5 % IV SOLN: INTRAVENOUS | Status: DC

## 2019-10-28 MED ORDER — ACETAMINOPHEN 325 MG PO TABS: 650.0000 mg | ORAL_TABLET | Freq: Four times a day (QID) | ORAL | Status: DC | PRN

## 2019-10-28 MED ORDER — POLYVINYL ALCOHOL 1.4 % OP SOLN
1.0000 [drp] | Freq: Four times a day (QID) | OPHTHALMIC | Status: DC | PRN
  Filled 2019-10-28: qty 15

## 2019-10-28 MED ORDER — DIPHENHYDRAMINE HCL 50 MG/ML IJ SOLN: 25.0000 mg | INTRAMUSCULAR | Status: DC | PRN

## 2019-10-28 MED ORDER — ACETAMINOPHEN 160 MG/5ML PO SOLN
650.0000 mg | Freq: Four times a day (QID) | ORAL | Status: DC | PRN
  Administered 2019-10-28 (×2): 650 mg
  Filled 2019-10-28 (×2): qty 20.3

## 2019-10-28 MED ORDER — ACETAMINOPHEN 650 MG RE SUPP: 650.0000 mg | Freq: Four times a day (QID) | RECTAL | Status: DC | PRN

## 2019-10-29 ENCOUNTER — Telehealth: Payer: Self-pay | Admitting: Internal Medicine

## 2019-10-29 NOTE — Telephone Encounter (Signed)
Called husband Audry Pili to express my condolences left message   Dr. Olivia Mackie McLean-Scocuzza

## 2019-10-31 LAB — CULTURE, BLOOD (ROUTINE X 2)
Culture: NO GROWTH
Culture: NO GROWTH
Special Requests: ADEQUATE

## 2019-11-01 DIAGNOSIS — J449 Chronic obstructive pulmonary disease, unspecified: Secondary | ICD-10-CM | POA: Diagnosis not present

## 2019-11-12 NOTE — Death Summary Note (Signed)
DEATH SUMMARY   Patient Details  Name: [name] [name] MRN: [id number] DOB: 04/02/1950  Admission/Discharge Information   Admit Date:  2019-11-16  Date of Death: Date of Death: Nov 19, 2019  Time of Death: Time of Death: 1200  Length of Stay: 2  Referring Physician: McLean-Scocuzza, Nino Glow, MD   Reason(s) for Hospitalization  Ventricular fibrillation   Diagnoses  Preliminary cause of death:   Ventricular Fibrillation Secondary Diagnoses (including complications and co-morbidities):  Active Problems:   Cardiac arrest [employer])   Brief Hospital Course (including significant findings, care, treatment, and services provided and events leading to death)  69 y/o female found down at home, VF arrest. Had 15 minutes of time without CPR, then EMS initiated CPR for 20 minutes.  Admitted to the ICU on 2022-11-16 on mechanical ventilator support, required vasopressors.  Noted on echo to have new systolic heart failure.   She had a past medical history of: COPD Atrial fibrillation Myocardial infarction Hypothyroidism Hypertension Hyperlipidemia Morbid obesity DVT Pulmonary embolism Breast cancer Asthma Anxiety/depression Anemia  After admission she was noted to have severe anoxic brain injury based on recurrent myoclonus activity and no response to external stimuli.  Targeted temperature management was used, but the patient did not make improvement.  The patient's family advised that they felt she would not want to live on mechanical ventilator support so they elected to compassionately withdraw care.    Pertinent Labs and Studies  Significant Diagnostic Studies CT HEAD WO CONTRAST  Result Date: 10/26/2019 CLINICAL DATA:  69 year old female with history of trauma from a fall. Cardiac arrest. EXAM: CT HEAD WITHOUT CONTRAST CT CERVICAL SPINE WITHOUT CONTRAST TECHNIQUE: Multidetector CT imaging of the head and cervical spine was performed following the standard protocol without intravenous  contrast. Multiplanar CT image reconstructions of the cervical spine were also generated. COMPARISON:  Head CT 10/08/2016. FINDINGS: CT HEAD FINDINGS Brain: No evidence of acute infarction, hemorrhage, hydrocephalus, extra-axial collection or mass lesion/mass effect. Vascular: No hyperdense vessel or unexpected calcification. Skull: Normal. Negative for fracture or focal lesion. Sinuses/Orbits: No acute finding. Mild multifocal mucosal thickening in the paranasal sinuses. Other: None. CT CERVICAL SPINE FINDINGS Alignment: Normal. Skull base and vertebrae: No acute fracture. No primary bone lesion or focal pathologic process. Soft tissues and spinal canal: No prevertebral fluid or swelling. No visible canal hematoma. Disc levels: Multilevel degenerative disc disease, most pronounced at C4-C5 in C5-C6. Mild multilevel facet arthropathy. Upper chest: Unremarkable. Other: Endotracheal tube and nasogastric tube. IMPRESSION: 1. No evidence of significant acute traumatic injury to the skull, brain or cervical spine. 2. The appearance of the brain is normal. 3. Mild multilevel degenerative disc disease and cervical spondylosis, as above. Electronically Signed   By: Vinnie Langton M.D.   On: 10/26/2019 14:40   CT CERVICAL SPINE WO CONTRAST  Result Date: 10/26/2019 CLINICAL DATA:  69 year old female with history of trauma from a fall. Cardiac arrest. EXAM: CT HEAD WITHOUT CONTRAST CT CERVICAL SPINE WITHOUT CONTRAST TECHNIQUE: Multidetector CT imaging of the head and cervical spine was performed following the standard protocol without intravenous contrast. Multiplanar CT image reconstructions of the cervical spine were also generated. COMPARISON:  Head CT 10/08/2016. FINDINGS: CT HEAD FINDINGS Brain: No evidence of acute infarction, hemorrhage, hydrocephalus, extra-axial collection or mass lesion/mass effect. Vascular: No hyperdense vessel or unexpected calcification. Skull: Normal. Negative for fracture or focal  lesion. Sinuses/Orbits: No acute finding. Mild multifocal mucosal thickening in the paranasal sinuses. Other: None. CT CERVICAL SPINE FINDINGS Alignment: Normal. Skull base  and vertebrae: No acute fracture. No primary bone lesion or focal pathologic process. Soft tissues and spinal canal: No prevertebral fluid or swelling. No visible canal hematoma. Disc levels: Multilevel degenerative disc disease, most pronounced at C4-C5 in C5-C6. Mild multilevel facet arthropathy. Upper chest: Unremarkable. Other: Endotracheal tube and nasogastric tube. IMPRESSION: 1. No evidence of significant acute traumatic injury to the skull, brain or cervical spine. 2. The appearance of the brain is normal. 3. Mild multilevel degenerative disc disease and cervical spondylosis, as above. Electronically Signed   By: Vinnie Langton M.D.   On: 10/26/2019 14:40   DG Chest Portable 1 View  Result Date: 10/18/2019 CLINICAL DATA:  Status post CPR EXAM: PORTABLE CHEST 1 VIEW COMPARISON:  04/03/2019 FINDINGS: Endotracheal tube and gastric catheter are noted in satisfactory position. Cardiac shadow is enlarged but accentuated by the frontal technique. Aortic calcifications are seen. The lungs are well aerated with patchy airspace opacities bilaterally which may represent pulmonary edema. No acute bony abnormality is noted. IMPRESSION: Patchy airspace opacities which may represent edema given the clinical history. Tubes and lines as described. Electronically Signed   By: Inez Catalina M.D.   On: 10/21/2019 22:51   EEG adult  Result Date: 10/26/2019 Lora Havens, MD     10/26/2019  9:30 AM Patient Name: [name] [name] MRN: [id number] Epilepsy Attending: Lora Havens Referring Physician/Provider: Dr Rhys Martini Date: 10/26/2019 Duration: 23.31 mins Patient history: 69yo F s/p cardiac arrest. EEG to evaluate for seizure. Level of alertness: Comatose AEDs during EEG study: Versed, propofol, LEV Technical aspects: This EEG study  was done with scalp electrodes positioned according to the 10-20 International system of electrode placement. Electrical activity was acquired at a sampling rate of 500Hz  and reviewed with a high frequency filter of 70Hz  and a low frequency filter of 1Hz . EEG data were recorded continuously and digitally stored. Description: Patient was noted to have frequent episodes of eye opening with axial jerking. Concomitant eeg showed generalized polyspikes consistent with myoclonic seizures. EEG also showed generalized background suppression.   Hyperventilation and photic stimulation were not performed.   ABNORMALITY - Myoclonic seizures, generalized - Background suppression, generalized IMPRESSION: This study showed myoclonic seizures as well as profound diffuse encephalopathy, likely secondary to anoxic-hypoxic brain injury. Priyanka Barbra Sarks   Overnight EEG with video  Result Date: 10/27/2019 Lora Havens, MD     10/27/2019  3:31 PM Patient Name: [name] [name] MRN: [id number] Epilepsy Attending: Lora Havens Referring Physician/Provider: Dr Rhys Martini Duration: 10/26/2019 0906 to 10/27/2019 1104  Patient history: 69yo F s/p cardiac arrest. EEG to evaluate for seizure.  Level of alertness: Comatose  AEDs during EEG study: Versed, propofol, LEV, VPA  Technical aspects: This EEG study was done with scalp electrodes positioned according to the 10-20 International system of electrode placement. Electrical activity was acquired at a sampling rate of 500Hz  and reviewed with a high frequency filter of 70Hz  and a low frequency filter of 1Hz . EEG data were recorded continuously and digitally stored.  Description: Patient was noted to have frequent episodes of eye opening with axial jerking. Concomitant eeg showed generalized polyspikes consistent with myoclonic seizures.Gradually seizures became more and more frequent and were happening near continuously after around 730am on 10/27/2019.  EEG also showed/  /generalized background suppression.   Hyperventilation and photic stimulation were not performed.    ABNORMALITY - Myoclonic status epilepticus, generalized - Background suppression, generalized  IMPRESSION: This study initially showed myoclonic seizures which gradually  worsened to myoclonic status epilepticus as well as profound diffuse encephalopathy, likely secondary to anoxic-hypoxic brain injury. Dr Lake Bells was notified.  Priyanka O Yadav   [place] EKG SITE RITE  Result Date: 10/26/2019 If Site Rite image not attached, placement could not be confirmed due to current cardiac rhythm.   Microbiology Recent Results (from the past 240 hour(s))  Respiratory Panel by RT PCR (Flu A&B, Covid) - Nasopharyngeal Swab     Status: None   Collection Time: 10/27/2019 10:15 PM   Specimen: Nasopharyngeal Swab  Result Value Ref Range Status   SARS Coronavirus 2 by RT PCR NEGATIVE NEGATIVE Final    Comment: (NOTE) SARS-CoV-2 target nucleic acids are NOT DETECTED.  The SARS-CoV-2 RNA is generally detectable in upper respiratoy specimens during the acute phase of infection. The lowest concentration of SARS-CoV-2 viral copies this assay can detect is 131 copies/mL. A negative result does not preclude SARS-Cov-2 infection and should not be used as the sole basis for treatment or other patient management decisions. A negative result may occur with  improper specimen collection/handling, submission of specimen other than nasopharyngeal swab, presence of viral mutation(s) within the areas targeted by this assay, and inadequate number of viral copies (<131 copies/mL). A negative result must be combined with clinical observations, patient history, and epidemiological information. The expected result is Negative.  Fact Sheet for Patients:  PinkCheek.be  Fact Sheet for Healthcare Providers:  GravelBags.it  This test is no t yet approved or cleared by  the [place] FDA and  has been authorized for detection and/or diagnosis of SARS-CoV-2 by FDA under an Emergency Use Authorization (EUA). This EUA will remain  in effect (meaning this test can be used) for the duration of the COVID-19 declaration under Section 564(b)(1) of the Act, 21 U.S.C. section 360bbb-3(b)(1), unless the authorization is terminated or revoked sooner.     Influenza A by PCR NEGATIVE NEGATIVE Final   Influenza B by PCR NEGATIVE NEGATIVE Final    Comment: (NOTE) The Xpert Xpress SARS-CoV-2/FLU/RSV assay is intended as an aid in  the diagnosis of influenza from Nasopharyngeal swab specimens and  should not be used as a sole basis for treatment. Nasal washings and  aspirates are unacceptable for Xpert Xpress SARS-CoV-2/FLU/RSV  testing.  Fact Sheet for Patients: PinkCheek.be  Fact Sheet for Healthcare Providers: GravelBags.it  This test is not yet approved or cleared by the [place] FDA and  has been authorized for detection and/or diagnosis of SARS-CoV-2 by  FDA under an Emergency Use Authorization (EUA). This EUA will remain  in effect (meaning this test can be used) for the duration of the  Covid-19 declaration under Section 564(b)(1) of the Act, 21  U.S.C. section 360bbb-3(b)(1), unless the authorization is  terminated or revoked. Performed at [facility], [place] [address] [place], [place] [address]   Blood culture (routine x 2)     Status: None (Preliminary result)   Collection Time: 10/26/19 12:17 AM   Specimen: BLOOD RIGHT HAND  Result Value Ref Range Status   Specimen Description BLOOD RIGHT HAND  Final   Special Requests   Final    AEROBIC BOTTLE ONLY Blood Culture results may not be optimal due to an inadequate volume of blood received in culture bottles   Culture   Final    NO GROWTH 3 DAYS Performed at [facility], [place] [address] [place], [place] [address]     Report Status PENDING  Incomplete  Blood culture (  routine x 2)     Status: None (Preliminary result)   Collection Time: 10/26/19  2:45 AM   Specimen: BLOOD  Result Value Ref Range Status   Specimen Description BLOOD RIGHT ANTECUBITAL  Final   Special Requests AEROBIC BOTTLE ONLY Blood Culture adequate volume  Final   Culture   Final    NO GROWTH 3 DAYS Performed at [facility], [address], [place], [place] [address]    Report Status PENDING  Incomplete  MRSA PCR Screening     Status: None   Collection Time: 10/26/19  4:21 AM   Specimen: Nasopharyngeal  Result Value Ref Range Status   MRSA by PCR NEGATIVE NEGATIVE Final    Comment:        The GeneXpert MRSA Assay (FDA approved for NASAL specimens only), is one component of a comprehensive MRSA colonization surveillance program. It is not intended to diagnose MRSA infection nor to guide or monitor treatment for MRSA infections. Performed at [facility], [place] [address], [place], [place] [address]     Lab Basic Metabolic Panel: Recent Labs  Lab 10/26/19 0129 10/26/19 0129 10/26/19 0505 10/26/19 1311 10/26/19 1711 10/26/19 2024 10/27/19 0423 10/27/19 1219 10/27/19 2331 11/10/19 0424  NA 136   < > 137 136  --  138 140 139  --   --   K 3.5   < > 3.1* 2.8*  --  3.0* 3.2* 4.0  --   --   CL 102  --   --  105  --  105 108 108  --   --   CO2 19*  --   --  19*  --  19* 19* 20*  --   --   GLUCOSE 353*  --   --  301*  --  147* 142* 200*  --   --   BUN 17  --   --  22  --  25* 27* 29*  --   --   CREATININE 1.44*  --   --  1.85*  --  2.07* 2.30* 2.62*  --   --   CALCIUM 7.1*  --   --  7.5*  --  7.6* 7.7* 7.7*  --   --   MG 1.9  --   --   --  1.8  --  1.9  --  2.2 2.1  PHOS 4.5  --   --   --  5.1*  --  6.1*  --  5.2* 5.0*   < > = values in this interval not displayed.   Liver Function Tests: Recent Labs  Lab 10/26/19 0017 10/26/19 1311  AST 426* 184*  ALT 199* 182*  ALKPHOS 81 77  BILITOT 1.9* 0.8   PROT 5.6* 6.0*  ALBUMIN 2.7* 2.8*   No results for input(s): LIPASE, AMYLASE in the last 168 hours. No results for input(s): AMMONIA in the last 168 hours. CBC: Recent Labs  Lab 10/12/2019 2229 10/14/2019 2251 10/26/19 0026 10/26/19 0033 10/26/19 0129 10/26/19 0505 10/27/19 0423  WBC 22.4*  --  23.4*  --  21.6*  --  23.5*  NEUTROABS  --   --  19.0*  --   --   --   --   HGB 9.5*   < > 9.9* 9.2* 10.2* 9.5* 9.1*  HCT 31.9*   < > 31.1* 27.0* 31.8* 28.0* 28.1*  MCV 94.7  --  93.1  --  90.6  --  87.3  PLT  303  --  320  --  287  --  289   < > = values in this interval not displayed.   Cardiac Enzymes: No results for input(s): CKTOTAL, CKMB, CKMBINDEX, TROPONINI in the last 168 hours. Sepsis Labs: Recent Labs  Lab 10/15/2019 2229 10/24/2019 2237 10/26/19 0017 10/26/19 0026 10/26/19 0129 10/26/19 1311 10/26/19 1529 10/27/19 0423 Nov 17, 2019 0424  PROCALCITON  --   --   --   --   --  20.05  --  20.36 18.13  WBC 22.4*  --   --  23.4* 21.6*  --   --  23.5*  --   LATICACIDVEN  --  7.6* 4.6*  --   --  4.6* 5.1*  --   --     Procedures/Operations  Endotracheal intubation PICC line insertion Continuous EEG   Roselie Awkward 10/29/2019, 6:42 PM

## 2019-11-12 NOTE — Progress Notes (Signed)
NAME:  SAMANTHAN DUGO, MRN:  [id number], DOB:  May 07, 1950, LOS: 2 ADMISSION DATE:  11/10/2019, CONSULTATION DATE:  10/14 REFERRING MD:  EDP, CHIEF COMPLAINT:  Post cardiac arrest   Brief History   69 y/o female found down at home, VF arrest. Had 15 minutes of time without CPR, then EMS initiated CPR for 20 minutes.  Admitted on 10/14  Past Medical History  COPD Atrial fibrillation Myocardial infarction Hypothyroidism Hypertension Hyperlipidemia Morbid obesity DVT Pulmonary embolism Breast cancer Asthma Anxiety/depression Anemia  Significant Hospital Events   10/14 admission 10/15 myoclonus activity  Consults:    Procedures:  10/14 ETT >   Significant Diagnostic Tests:  EEG 10/15 myoclonic seizures and anoxic injury   Micro Data:  10/14 SARS CoV-2/Flu>  negative  Antimicrobials:  10/14 cefepime  Interim history/subjective:   No acute events No further seizure activity  Objective   Blood pressure (!) 121/45, pulse 84, temperature (!) 101.5 F (38.6 C), resp. rate (!) 0, height 5\' 5"  (1.651 m), weight 117.2 kg, SpO2 97 %.    Vent Mode: PRVC FiO2 (%):  [40 %-50 %] 50 % Set Rate:  [24 bmp] 24 bmp Vt Set:  [480 mL] 480 mL PEEP:  [5 cmH20] 5 cmH20 Plateau Pressure:  [20 VOZ36-64 cmH20] 21 cmH20   Intake/Output Summary (Last 24 hours) at 10-Nov-2019 4034 Last data filed at 11-10-2019 0600 Gross per 24 hour  Intake 4768.85 ml  Output 1050 ml  Net 3718.85 ml   Filed Weights   10/26/19 0200 10/27/19 0500 11-10-19 0457  Weight: 115.7 kg 116.1 kg 117.2 kg    Examination:  General:  In bed on vent HENT: NCAT ETT in place PULM: CTA B, vent supported breathing CV: RRR, no mgr GI: BS+, soft, nontender MSK: normal bulk and tone Neuro: sedated on vent    Resolved Hospital Problem list     Assessment & Plan:  Out of hospital VF arrest New systolic heart failure Anoxic brain injury> severe myoclonus > due to anoxia Acute respiratory failure with  hypoxemia due to inability to protect airway Diffuse pulmonary infiltrates Atrial fibrillation AKI Chronic anemia without evidence of bleeding DM2  Discussion: She has severe anoxic brain injury.  Her family has stated she would not want to exist like this on machines.  Plan: Stop cefepime, insulin, vasopressor support Maintain anti-epileptics Maintain fentanyl/versed (high dose) for comfort Withdraw ventilator support Mouth care/comfort measures per routine Order comfort care orderset    Best practice:  Diet: d/c tube feeding Pain/Anxiety/Delirium protocol (if indicated): as above VAP protocol (if indicated): yes DVT prophylaxis: d/c sub q heparin GI prophylaxis: d/c Pantoprazole for stress ulcer prophylaxis Glucose control: d/c SSI Mobility: bed rest Code Status: DNR Family Communication: updated family bedside Disposition:   Labs   CBC: Recent Labs  Lab 10/18/2019 2229 11/10/2019 2251 10/26/19 0026 10/26/19 0033 10/26/19 0129 10/26/19 0505 10/27/19 0423  WBC 22.4*  --  23.4*  --  21.6*  --  23.5*  NEUTROABS  --   --  19.0*  --   --   --   --   HGB 9.5*   < > 9.9* 9.2* 10.2* 9.5* 9.1*  HCT 31.9*   < > 31.1* 27.0* 31.8* 28.0* 28.1*  MCV 94.7  --  93.1  --  90.6  --  87.3  PLT 303  --  320  --  287  --  289   < > = values in this interval not displayed.  Basic Metabolic Panel: Recent Labs  Lab 10/26/19 0129 10/26/19 0129 10/26/19 0505 10/26/19 1311 10/26/19 1711 10/26/19 2024 10/27/19 0423 10/27/19 1219 10/27/19 2331 Oct 30, 2019 0424  NA 136   < > 137 136  --  138 140 139  --   --   K 3.5   < > 3.1* 2.8*  --  3.0* 3.2* 4.0  --   --   CL 102  --   --  105  --  105 108 108  --   --   CO2 19*  --   --  19*  --  19* 19* 20*  --   --   GLUCOSE 353*  --   --  301*  --  147* 142* 200*  --   --   BUN 17  --   --  22  --  25* 27* 29*  --   --   CREATININE 1.44*  --   --  1.85*  --  2.07* 2.30* 2.62*  --   --   CALCIUM 7.1*  --   --  7.5*  --  7.6* 7.7* 7.7*   --   --   MG 1.9  --   --   --  1.8  --  1.9  --  2.2 2.1  PHOS 4.5  --   --   --  5.1*  --  6.1*  --  5.2* 5.0*   < > = values in this interval not displayed.   GFR: Estimated Creatinine Clearance: 25.9 mL/min (A) (by C-G formula based on SCr of 2.62 mg/dL (H)). Recent Labs  Lab 10/29/2019 2229 10/29/2019 2237 10/26/19 0017 10/26/19 0026 10/26/19 0129 10/26/19 1311 10/26/19 1529 10/27/19 0423 10/30/19 0424  PROCALCITON  --   --   --   --   --  20.05  --  20.36 18.13  WBC 22.4*  --   --  23.4* 21.6*  --   --  23.5*  --   LATICACIDVEN  --  7.6* 4.6*  --   --  4.6* 5.1*  --   --     Liver Function Tests: Recent Labs  Lab 10/26/19 0017 10/26/19 1311  AST 426* 184*  ALT 199* 182*  ALKPHOS 81 77  BILITOT 1.9* 0.8  PROT 5.6* 6.0*  ALBUMIN 2.7* 2.8*   No results for input(s): LIPASE, AMYLASE in the last 168 hours. No results for input(s): AMMONIA in the last 168 hours.  ABG    Component Value Date/Time   PHART 7.261 (L) 10/26/2019 0505   PCO2ART 47.7 10/26/2019 0505   PO2ART 104 10/26/2019 0505   HCO3 21.7 10/26/2019 0505   TCO2 23 10/26/2019 0505   ACIDBASEDEF 5.0 (H) 10/26/2019 0505   O2SAT 97.0 10/26/2019 0505     Coagulation Profile: Recent Labs  Lab 10/29/2019 2229 10/26/19 0026 10/26/19 0850  INR 1.7* 1.7* 1.6*    Cardiac Enzymes: No results for input(s): CKTOTAL, CKMB, CKMBINDEX, TROPONINI in the last 168 hours.  HbA1C: Hemoglobin A1C  Date/Time Value Ref Range Status  05/13/2017 11:34 AM 5.0  Final   Hgb A1c MFr Bld  Date/Time Value Ref Range Status  10/27/2017 10:54 AM 4.6 4.6 - 6.5 % Final    Comment:    Glycemic Control Guidelines for People with Diabetes:Non Diabetic:  <6%Goal of Therapy: <7%Additional Action Suggested:  >8%   11/08/2016 10:20 AM 4.7 4.6 - 6.5 % Final    Comment:    Glycemic Control Guidelines for  People with Diabetes:Non Diabetic:  <6%Goal of Therapy: <7%Additional Action Suggested:  >8%     CBG: Recent Labs  Lab  10/27/19 1535 10/27/19 2000 11/20/2019 0011 11-20-2019 0413 November 20, 2019 0734  GLUCAP 168* 151* 144* 138* 137*     Critical care time: 30 minutes     Roselie Awkward, MD [facility] Pager: [phone] Cell: [phone] If no response, call [phone]

## 2019-11-12 NOTE — Progress Notes (Signed)
[facility] Progress Note Patient Name: [name] [name] DOB: [birth year] MRN: [id number]   Date of Service  Nov 26, 2019  HPI/Events of Note  Fever to 100.6 F - Request for Tylenol. Patient DNR with plans to withdraw care in AM.  eICU Interventions  Plan: 1. Tylenol liquid 650 mg per tube Q 6 hours PRN Temp > 101.0 F.     Intervention Category Major Interventions: Other:  Evani Shrider Cornelia Copa 11/26/19, 1:00 AM

## 2019-11-12 NOTE — Progress Notes (Signed)
At bedside with family. Comfort measures ongoing. MD at bedside to address any family concerns or questions regarding the patient. Will continue to comfort family and prepare to continue to terminal withdrawal insuring all needs met for patient and family at bedside.

## 2019-11-12 NOTE — Progress Notes (Signed)
Pt time of death 1200 per 2 RNs per protocol. MD, elink, and CDS notified. Eyes prepped for possible case. Emotional support provided. Post mortum care completed.

## 2019-11-12 NOTE — Procedures (Signed)
Extubation Procedure Note  Patient Details:   Name: [name] [name] DOB: 04/09/1914 MRN: [id number]   Airway Documentation:    Vent end date: 05-Nov-2019 Vent end time: 1145   Evaluation  O2 sats: currently acceptable Complications: No apparent complications Patient did tolerate procedure well. Bilateral Breath Sounds: Diminished   No   Pt terminally extubated per MD order.  RN at bedside along with family.  RT will continue to monitor.  Valora Piccolo 2019/11/05, 11:50 AM

## 2019-11-12 DEATH — deceased

## 2020-02-04 ENCOUNTER — Ambulatory Visit: Payer: Medicare Other | Admitting: Cardiology

## 2020-04-02 ENCOUNTER — Ambulatory Visit: Payer: Medicare Other
